# Patient Record
Sex: Female | Born: 1950 | Race: White | Hispanic: No | Marital: Married | Smoking: Never smoker
Health system: Southern US, Community
[De-identification: ages and names within clinical notes are randomized; demographics above are authoritative.]

## PROBLEM LIST (undated history)

## (undated) DIAGNOSIS — IMO0002 Reserved for concepts with insufficient information to code with codable children: Secondary | ICD-10-CM

## (undated) DIAGNOSIS — D649 Anemia, unspecified: Secondary | ICD-10-CM

## (undated) DIAGNOSIS — I739 Peripheral vascular disease, unspecified: Secondary | ICD-10-CM

## (undated) DIAGNOSIS — D689 Coagulation defect, unspecified: Secondary | ICD-10-CM

## (undated) DIAGNOSIS — F419 Anxiety disorder, unspecified: Secondary | ICD-10-CM

## (undated) DIAGNOSIS — M545 Low back pain, unspecified: Secondary | ICD-10-CM

## (undated) DIAGNOSIS — K219 Gastro-esophageal reflux disease without esophagitis: Secondary | ICD-10-CM

## (undated) DIAGNOSIS — K279 Peptic ulcer, site unspecified, unspecified as acute or chronic, without hemorrhage or perforation: Secondary | ICD-10-CM

## (undated) DIAGNOSIS — H269 Unspecified cataract: Secondary | ICD-10-CM

## (undated) DIAGNOSIS — A4902 Methicillin resistant Staphylococcus aureus infection, unspecified site: Secondary | ICD-10-CM

## (undated) DIAGNOSIS — G43709 Chronic migraine without aura, not intractable, without status migrainosus: Secondary | ICD-10-CM

## (undated) DIAGNOSIS — M1990 Unspecified osteoarthritis, unspecified site: Secondary | ICD-10-CM

## (undated) DIAGNOSIS — K589 Irritable bowel syndrome without diarrhea: Secondary | ICD-10-CM

## (undated) DIAGNOSIS — I10 Essential (primary) hypertension: Secondary | ICD-10-CM

## (undated) HISTORY — DX: Coagulation defect, unspecified: D68.9

## (undated) HISTORY — DX: Anemia, unspecified: D64.9

## (undated) HISTORY — DX: Anxiety disorder, unspecified: F41.9

## (undated) HISTORY — DX: Unspecified osteoarthritis, unspecified site: M19.90

## (undated) HISTORY — DX: Low back pain, unspecified: M54.50

## (undated) HISTORY — DX: Low back pain: M54.5

## (undated) HISTORY — DX: Peripheral vascular disease, unspecified: I73.9

## (undated) HISTORY — DX: Peptic ulcer, site unspecified, unspecified as acute or chronic, without hemorrhage or perforation: K27.9

## (undated) HISTORY — DX: Irritable bowel syndrome, unspecified: K58.9

## (undated) HISTORY — DX: Methicillin resistant Staphylococcus aureus infection, unspecified site: A49.02

## (undated) HISTORY — PX: LAPAROSCOPIC BILATERAL SALPINGO OOPHERECTOMY: SHX5890

## (undated) HISTORY — PX: ABDOMINAL HYSTERECTOMY: SHX81

## (undated) HISTORY — DX: Gastro-esophageal reflux disease without esophagitis: K21.9

## (undated) HISTORY — DX: Unspecified cataract: H26.9

## (undated) HISTORY — PX: TONSILLECTOMY AND ADENOIDECTOMY: SUR1326

## (undated) HISTORY — DX: Essential (primary) hypertension: I10

## (undated) HISTORY — DX: Reserved for concepts with insufficient information to code with codable children: IMO0002

## (undated) HISTORY — DX: Chronic migraine without aura, not intractable, without status migrainosus: G43.709

---

## 1982-11-28 HISTORY — PX: TUBAL LIGATION: SHX77

## 1998-05-08 ENCOUNTER — Ambulatory Visit (HOSPITAL_COMMUNITY): Admission: RE | Admit: 1998-05-08 | Discharge: 1998-05-08 | Payer: Self-pay | Admitting: Internal Medicine

## 1998-09-08 ENCOUNTER — Ambulatory Visit (HOSPITAL_COMMUNITY): Admission: RE | Admit: 1998-09-08 | Discharge: 1998-09-08 | Payer: Self-pay | Admitting: Internal Medicine

## 1998-10-16 ENCOUNTER — Encounter: Payer: Self-pay | Admitting: Internal Medicine

## 1999-04-21 ENCOUNTER — Inpatient Hospital Stay (HOSPITAL_COMMUNITY): Admission: RE | Admit: 1999-04-21 | Discharge: 1999-04-23 | Payer: Self-pay | Admitting: Obstetrics and Gynecology

## 1999-10-13 ENCOUNTER — Ambulatory Visit (HOSPITAL_COMMUNITY): Admission: RE | Admit: 1999-10-13 | Discharge: 1999-10-13 | Payer: Self-pay | Admitting: Pulmonary Disease

## 1999-10-13 ENCOUNTER — Encounter: Payer: Self-pay | Admitting: Pulmonary Disease

## 2000-08-30 ENCOUNTER — Ambulatory Visit (HOSPITAL_COMMUNITY): Admission: RE | Admit: 2000-08-30 | Discharge: 2000-08-30 | Payer: Self-pay | Admitting: Internal Medicine

## 2000-11-07 ENCOUNTER — Ambulatory Visit (HOSPITAL_COMMUNITY): Admission: RE | Admit: 2000-11-07 | Discharge: 2000-11-07 | Payer: Self-pay | Admitting: Internal Medicine

## 2000-11-07 ENCOUNTER — Encounter: Payer: Self-pay | Admitting: Internal Medicine

## 2000-12-06 ENCOUNTER — Encounter: Payer: Self-pay | Admitting: General Surgery

## 2000-12-11 ENCOUNTER — Inpatient Hospital Stay (HOSPITAL_COMMUNITY): Admission: RE | Admit: 2000-12-11 | Discharge: 2000-12-16 | Payer: Self-pay | Admitting: General Surgery

## 2000-12-11 ENCOUNTER — Encounter (INDEPENDENT_AMBULATORY_CARE_PROVIDER_SITE_OTHER): Payer: Self-pay

## 2000-12-11 ENCOUNTER — Encounter (INDEPENDENT_AMBULATORY_CARE_PROVIDER_SITE_OTHER): Payer: Self-pay | Admitting: *Deleted

## 2001-02-13 ENCOUNTER — Ambulatory Visit (HOSPITAL_COMMUNITY): Admission: RE | Admit: 2001-02-13 | Discharge: 2001-02-13 | Payer: Self-pay | Admitting: Unknown Physician Specialty

## 2001-02-13 ENCOUNTER — Encounter (INDEPENDENT_AMBULATORY_CARE_PROVIDER_SITE_OTHER): Payer: Self-pay | Admitting: *Deleted

## 2001-02-13 ENCOUNTER — Encounter: Payer: Self-pay | Admitting: General Surgery

## 2001-11-28 HISTORY — PX: OTHER SURGICAL HISTORY: SHX169

## 2002-03-26 ENCOUNTER — Encounter: Payer: Self-pay | Admitting: Pulmonary Disease

## 2002-07-24 ENCOUNTER — Encounter (INDEPENDENT_AMBULATORY_CARE_PROVIDER_SITE_OTHER): Payer: Self-pay | Admitting: *Deleted

## 2002-07-24 ENCOUNTER — Encounter: Payer: Self-pay | Admitting: Pulmonary Disease

## 2002-07-24 ENCOUNTER — Ambulatory Visit (HOSPITAL_COMMUNITY): Admission: RE | Admit: 2002-07-24 | Discharge: 2002-07-24 | Payer: Self-pay | Admitting: Pulmonary Disease

## 2002-07-31 ENCOUNTER — Encounter: Payer: Self-pay | Admitting: Pulmonary Disease

## 2002-07-31 ENCOUNTER — Ambulatory Visit (HOSPITAL_COMMUNITY): Admission: RE | Admit: 2002-07-31 | Discharge: 2002-07-31 | Payer: Self-pay | Admitting: Pulmonary Disease

## 2003-01-03 ENCOUNTER — Encounter: Payer: Self-pay | Admitting: Internal Medicine

## 2003-01-15 ENCOUNTER — Encounter: Payer: Self-pay | Admitting: Internal Medicine

## 2003-07-16 ENCOUNTER — Ambulatory Visit (HOSPITAL_COMMUNITY): Admission: RE | Admit: 2003-07-16 | Discharge: 2003-07-16 | Payer: Self-pay | Admitting: Internal Medicine

## 2003-07-16 ENCOUNTER — Encounter (INDEPENDENT_AMBULATORY_CARE_PROVIDER_SITE_OTHER): Payer: Self-pay | Admitting: *Deleted

## 2003-07-16 ENCOUNTER — Encounter: Payer: Self-pay | Admitting: Internal Medicine

## 2004-11-23 ENCOUNTER — Ambulatory Visit: Payer: Self-pay | Admitting: Pulmonary Disease

## 2004-12-03 ENCOUNTER — Ambulatory Visit (HOSPITAL_COMMUNITY): Admission: RE | Admit: 2004-12-03 | Discharge: 2004-12-03 | Payer: Self-pay | Admitting: Pulmonary Disease

## 2004-12-03 IMAGING — CT CT CHEST W/ CM
1 of 2 series · 15 of 32 positions shown, 19 images · IV contrast (omnipaque)
Comparison: none

CLINICAL DATA: 53 year old with right lower lobe nodule.  
 CT CHEST:
 Prior study from [HOSPITAL] performed on [DATE] is used for comparison. 
 Helical images are performed through the chest during the administration of 100 cc of Omnipaque 300.  Within the right lower lobe, there is a 4 mm nodule (image 33-50). This appears stable.  Small subpleural density is seen adjacent to the right middle lobe on image on image 36-50 is also stable in appearance.  There may be a small nodule within the right middle lobe measuring 5 mm on image 33-50 vs. small vessel but this is also stable in appearance.  No new nodules are identified.  No focal consolidations or pleural effusions are seen. There is minimal bibasilar dependent change.  
 No mediastinal, hilar axillary adenopathy.  Images of the upper abdomen show surgical clips in the GE junction region but no other acute abnormality identified.

[Series 2: chest routine 5.0 b30f · axial · 0.60mm/px · z∈[+1066,+1332]mm · 15 of 63 slices shown, 19 images]
[im 5/63  mediastinal]
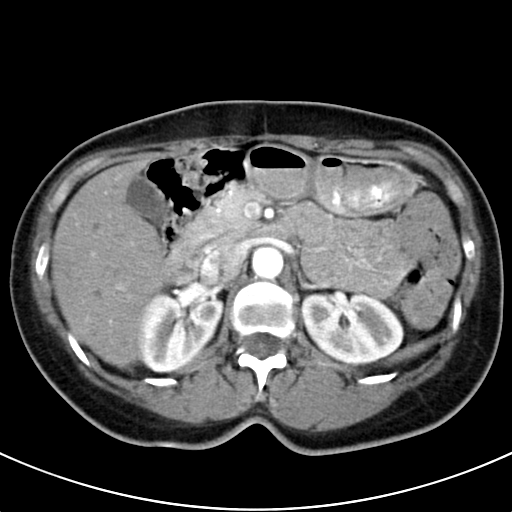
[im 5/63  lung]
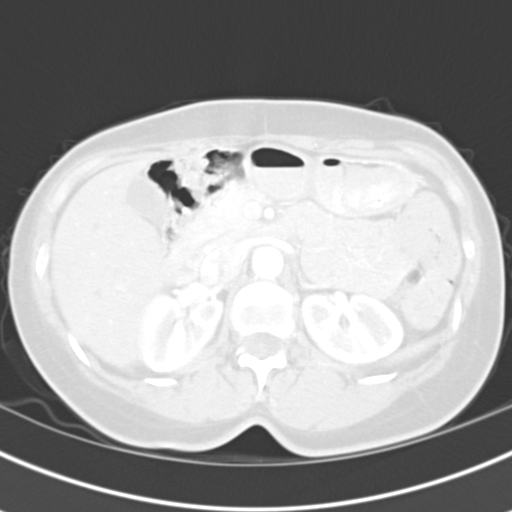
[im 9/63  lung]
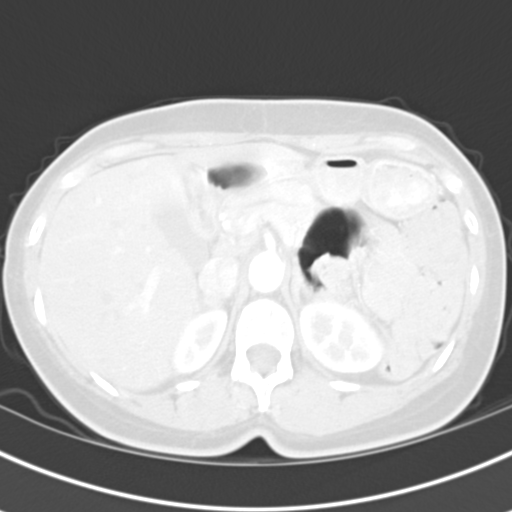
[im 13/63  lung]
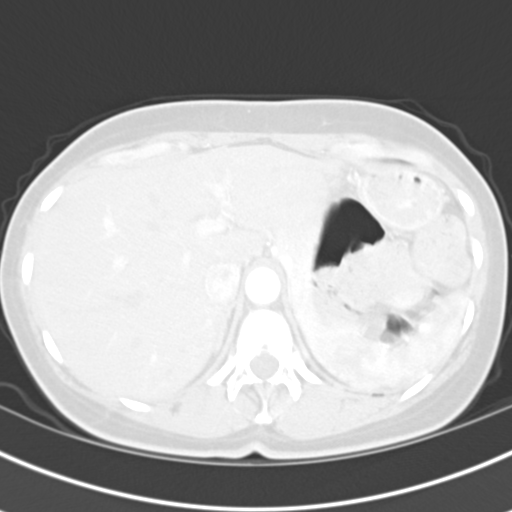
[im 17/63  lung]
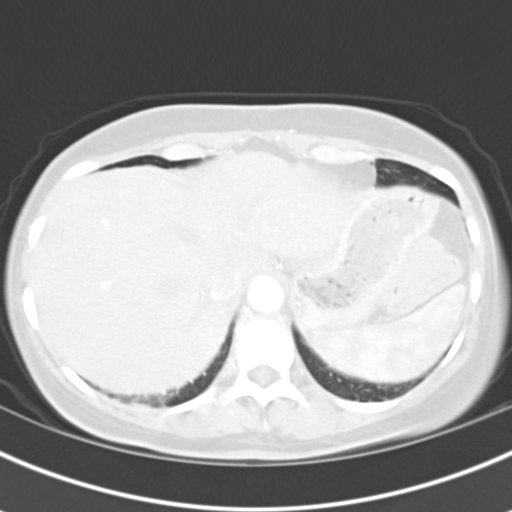
[im 21/63  mediastinal]
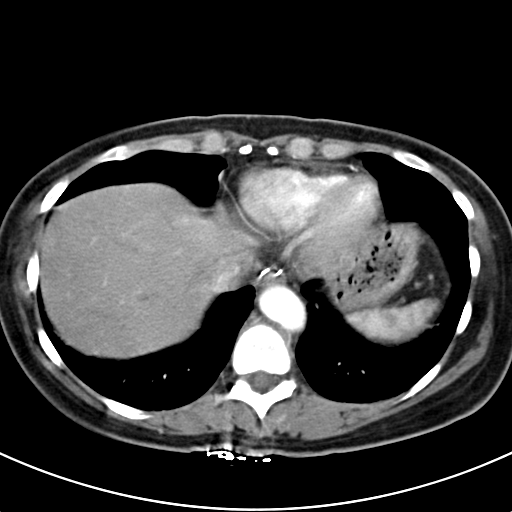
[im 21/63  lung]
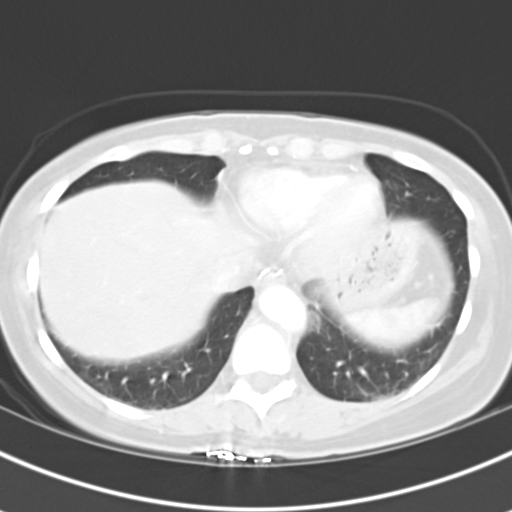
[im 25/63  lung]
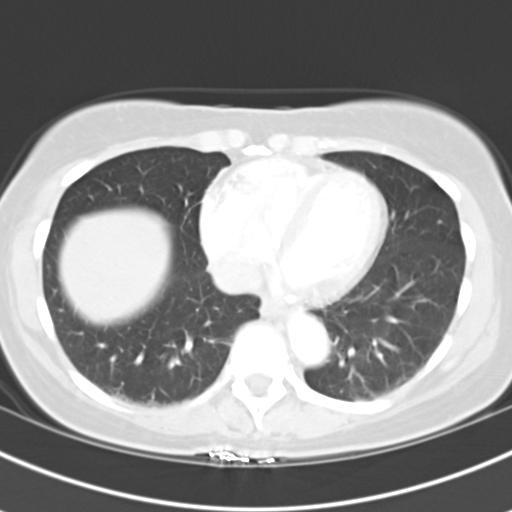
[im 29/63  lung]
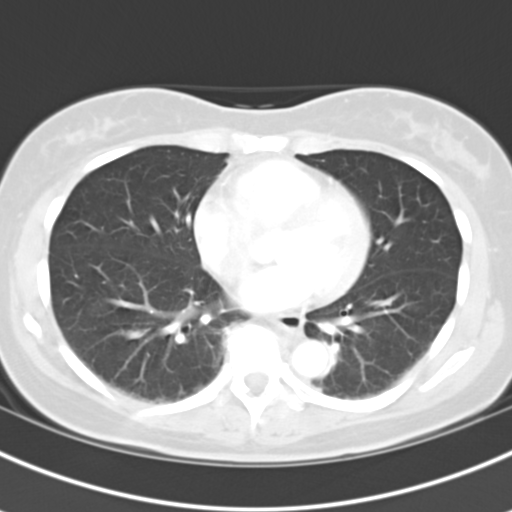
[im 32/63  lung]
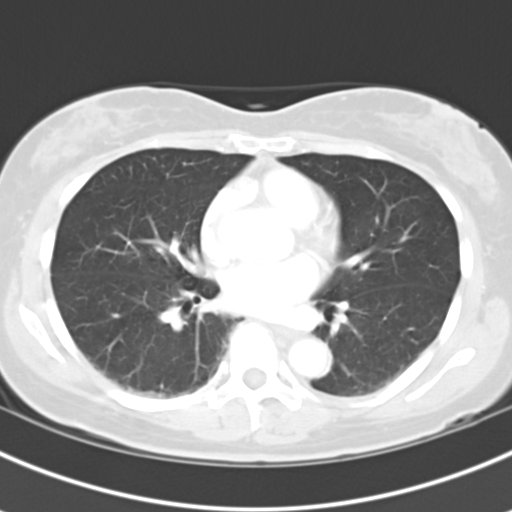
[im 34/63  mediastinal]
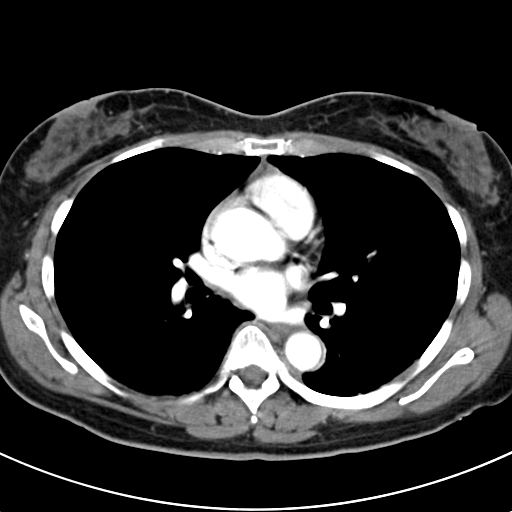
[im 34/63  lung]
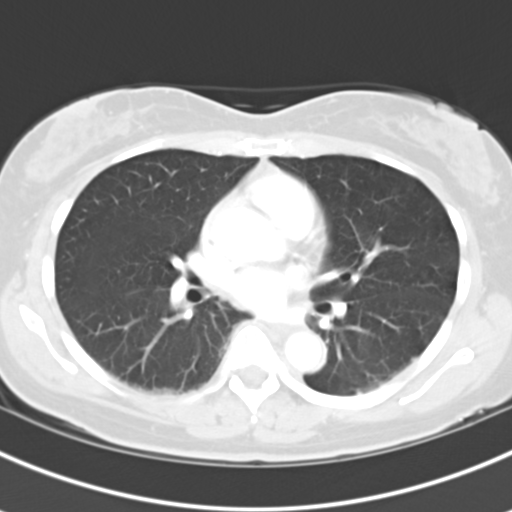
[im 38/63  lung]
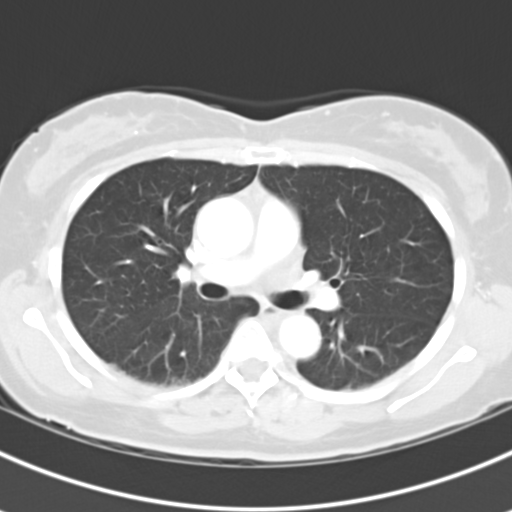
[im 42/63  lung]
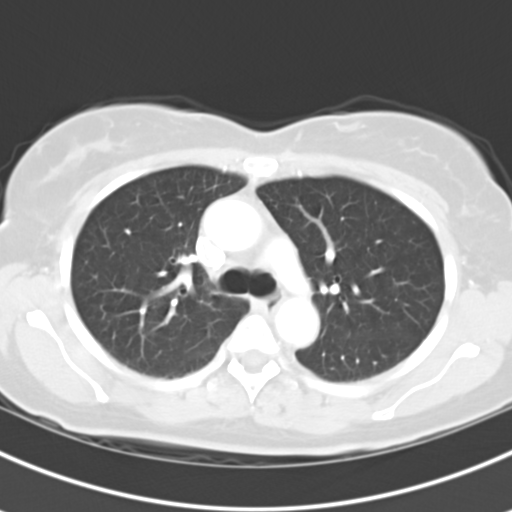
[im 46/63  lung]
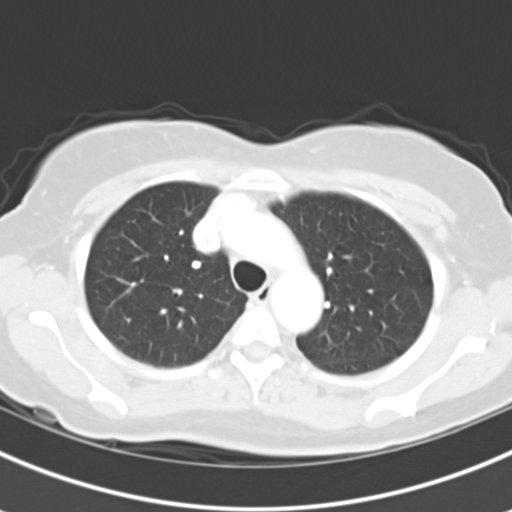
[im 50/63  mediastinal]
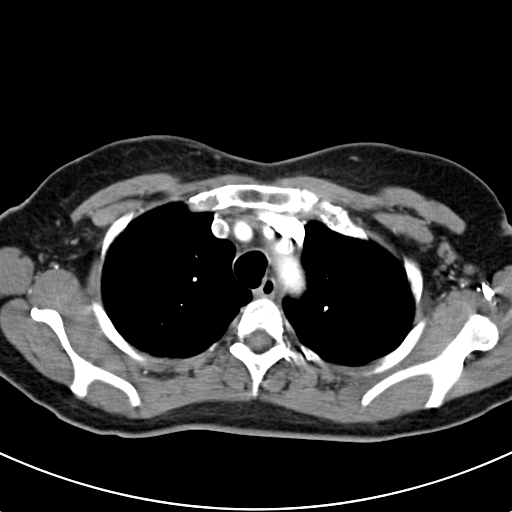
[im 50/63  lung]
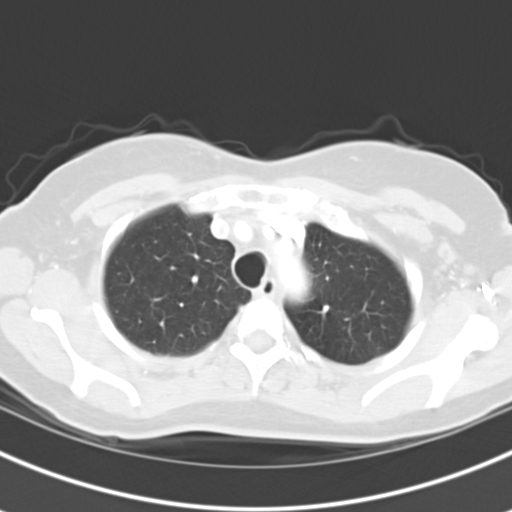
[im 54/63  lung]
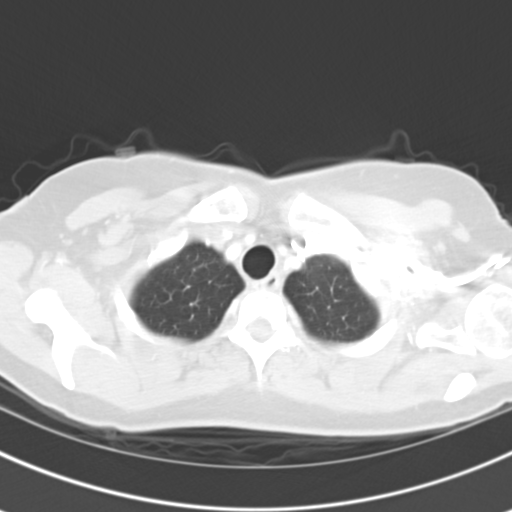
[im 58/63  lung]
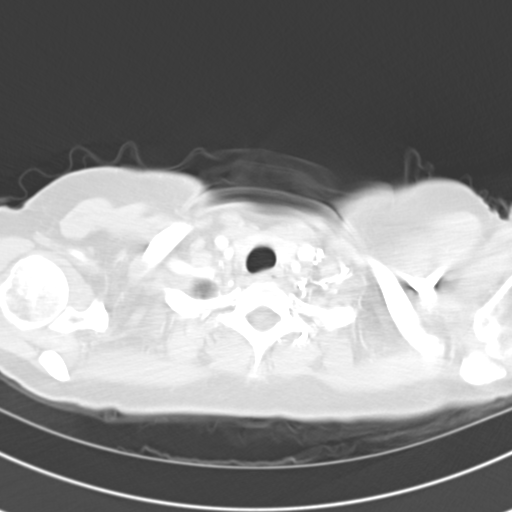

[15 of 32 positions shown; findings below may reference images not displayed]

IMPRESSION: Stable right lung nodules. These are nonspecific in appearance and may be merely post inflammatory given their stability.  I would recommend a second 6 month follow-up however for further evaluation.

## 2006-06-02 ENCOUNTER — Ambulatory Visit: Payer: Self-pay | Admitting: Pulmonary Disease

## 2007-01-25 ENCOUNTER — Ambulatory Visit: Payer: Self-pay | Admitting: Pulmonary Disease

## 2007-11-07 ENCOUNTER — Encounter: Payer: Self-pay | Admitting: Pulmonary Disease

## 2007-11-19 ENCOUNTER — Encounter: Payer: Self-pay | Admitting: Pulmonary Disease

## 2007-11-19 ENCOUNTER — Ambulatory Visit: Payer: Self-pay | Admitting: Vascular Surgery

## 2008-02-26 ENCOUNTER — Encounter: Payer: Self-pay | Admitting: Pulmonary Disease

## 2008-04-01 ENCOUNTER — Ambulatory Visit: Payer: Self-pay | Admitting: Pulmonary Disease

## 2008-04-01 ENCOUNTER — Encounter: Payer: Self-pay | Admitting: Adult Health

## 2008-04-01 DIAGNOSIS — G43909 Migraine, unspecified, not intractable, without status migrainosus: Secondary | ICD-10-CM | POA: Insufficient documentation

## 2008-04-01 DIAGNOSIS — J309 Allergic rhinitis, unspecified: Secondary | ICD-10-CM | POA: Insufficient documentation

## 2008-04-01 DIAGNOSIS — M545 Low back pain, unspecified: Secondary | ICD-10-CM | POA: Insufficient documentation

## 2008-04-01 DIAGNOSIS — I10 Essential (primary) hypertension: Secondary | ICD-10-CM | POA: Insufficient documentation

## 2008-04-01 DIAGNOSIS — F419 Anxiety disorder, unspecified: Secondary | ICD-10-CM | POA: Insufficient documentation

## 2008-04-01 DIAGNOSIS — K219 Gastro-esophageal reflux disease without esophagitis: Secondary | ICD-10-CM | POA: Insufficient documentation

## 2008-04-01 DIAGNOSIS — G43709 Chronic migraine without aura, not intractable, without status migrainosus: Secondary | ICD-10-CM

## 2008-04-02 LAB — CONVERTED CEMR LAB
ALT: 14 units/L (ref 0–35)
AST: 20 units/L (ref 0–37)
Albumin: 4 g/dL (ref 3.5–5.2)
Alkaline Phosphatase: 65 units/L (ref 39–117)
BUN: 22 mg/dL (ref 6–23)
Basophils Relative: 4.5 % — ABNORMAL HIGH (ref 0.0–1.0)
CO2: 29 meq/L (ref 19–32)
Chloride: 112 meq/L (ref 96–112)
Eosinophils Absolute: 0.2 10*3/uL (ref 0.0–0.7)
Eosinophils Relative: 5.3 % — ABNORMAL HIGH (ref 0.0–5.0)
GFR calc non Af Amer: 54 mL/min
Lymphocytes Relative: 48.4 % — ABNORMAL HIGH (ref 12.0–46.0)
MCV: 90.4 fL (ref 78.0–100.0)
Monocytes Relative: 3.4 % (ref 3.0–12.0)
Neutrophils Relative %: 38.4 % — ABNORMAL LOW (ref 43.0–77.0)
Platelets: 207 10*3/uL (ref 150–400)
Potassium: 3.6 meq/L (ref 3.5–5.1)
RBC: 3.92 M/uL (ref 3.87–5.11)
Total Bilirubin: 0.6 mg/dL (ref 0.3–1.2)
WBC: 4.6 10*3/uL (ref 4.5–10.5)

## 2008-05-29 ENCOUNTER — Telehealth: Payer: Self-pay | Admitting: Pulmonary Disease

## 2008-06-03 ENCOUNTER — Ambulatory Visit: Payer: Self-pay | Admitting: Pulmonary Disease

## 2008-06-09 ENCOUNTER — Ambulatory Visit: Payer: Self-pay | Admitting: Pulmonary Disease

## 2008-06-09 DIAGNOSIS — I739 Peripheral vascular disease, unspecified: Secondary | ICD-10-CM | POA: Insufficient documentation

## 2008-06-09 DIAGNOSIS — J984 Other disorders of lung: Secondary | ICD-10-CM

## 2008-06-09 DIAGNOSIS — R911 Solitary pulmonary nodule: Secondary | ICD-10-CM | POA: Insufficient documentation

## 2008-06-09 LAB — CONVERTED CEMR LAB
ALT: 19 units/L (ref 0–35)
AST: 21 units/L (ref 0–37)
Alkaline Phosphatase: 71 units/L (ref 39–117)
Bilirubin, Direct: 0.1 mg/dL (ref 0.0–0.3)
CO2: 26 meq/L (ref 19–32)
Chloride: 108 meq/L (ref 96–112)
Glucose, Bld: 90 mg/dL (ref 70–99)
HDL: 40.7 mg/dL (ref >39.0)
LDL Cholesterol: 80 mg/dL (ref 0–99)
Lymphocytes Relative: 37.5 % (ref 12.0–46.0)
Monocytes Relative: 9.2 % (ref 3.0–12.0)
Platelets: 245 10*3/uL (ref 150–400)
Potassium: 3.4 meq/L — ABNORMAL LOW (ref 3.5–5.1)
RDW: 13.4 % (ref 11.5–14.6)
Sodium: 141 meq/L (ref 135–145)
Total CHOL/HDL Ratio: 3.6
Total Protein: 7 g/dL (ref 6.0–8.3)
Triglycerides: 126 mg/dL (ref 0–149)
WBC: 4.8 10*3/uL (ref 4.5–10.5)

## 2008-08-15 ENCOUNTER — Telehealth (INDEPENDENT_AMBULATORY_CARE_PROVIDER_SITE_OTHER): Payer: Self-pay | Admitting: *Deleted

## 2008-08-28 ENCOUNTER — Telehealth (INDEPENDENT_AMBULATORY_CARE_PROVIDER_SITE_OTHER): Payer: Self-pay | Admitting: *Deleted

## 2008-09-28 ENCOUNTER — Telehealth: Payer: Self-pay | Admitting: Pulmonary Disease

## 2008-12-08 ENCOUNTER — Ambulatory Visit: Payer: Self-pay | Admitting: Pulmonary Disease

## 2008-12-10 ENCOUNTER — Ambulatory Visit: Payer: Self-pay | Admitting: Pulmonary Disease

## 2008-12-12 LAB — CONVERTED CEMR LAB
ALT: 19 units/L (ref 0–35)
Alkaline Phosphatase: 61 units/L (ref 39–117)
Bilirubin, Direct: 0.1 mg/dL (ref 0.0–0.3)
CO2: 28 meq/L (ref 19–32)
Calcium: 9.2 mg/dL (ref 8.4–10.5)
Glucose, Bld: 75 mg/dL (ref 70–99)
Hemoglobin: 13 g/dL (ref 12.0–15.0)
LDL Cholesterol: 105 mg/dL — ABNORMAL HIGH (ref 0–99)
Lymphocytes Relative: 47.4 % — ABNORMAL HIGH (ref 12.0–46.0)
Monocytes Relative: 9.9 % (ref 3.0–12.0)
Neutro Abs: 1.4 10*3/uL (ref 1.4–7.7)
RBC: 4.24 M/uL (ref 3.87–5.11)
RDW: 14 % (ref 11.5–14.6)
Sodium: 144 meq/L (ref 135–145)
Total CHOL/HDL Ratio: 3.5
Total Protein: 7.5 g/dL (ref 6.0–8.3)
Triglycerides: 121 mg/dL (ref 0–149)
WBC: 3.9 10*3/uL — ABNORMAL LOW (ref 4.5–10.5)

## 2009-01-07 DIAGNOSIS — R1013 Epigastric pain: Secondary | ICD-10-CM | POA: Insufficient documentation

## 2009-01-07 DIAGNOSIS — K315 Obstruction of duodenum: Secondary | ICD-10-CM | POA: Insufficient documentation

## 2009-01-07 DIAGNOSIS — M1990 Unspecified osteoarthritis, unspecified site: Secondary | ICD-10-CM | POA: Insufficient documentation

## 2009-01-07 DIAGNOSIS — K277 Chronic peptic ulcer, site unspecified, without hemorrhage or perforation: Secondary | ICD-10-CM | POA: Insufficient documentation

## 2009-01-07 DIAGNOSIS — K589 Irritable bowel syndrome without diarrhea: Secondary | ICD-10-CM | POA: Insufficient documentation

## 2009-01-07 DIAGNOSIS — Z862 Personal history of diseases of the blood and blood-forming organs and certain disorders involving the immune mechanism: Secondary | ICD-10-CM | POA: Insufficient documentation

## 2009-01-07 DIAGNOSIS — Z8719 Personal history of other diseases of the digestive system: Secondary | ICD-10-CM | POA: Insufficient documentation

## 2009-01-07 DIAGNOSIS — F32A Depression, unspecified: Secondary | ICD-10-CM | POA: Insufficient documentation

## 2009-01-07 DIAGNOSIS — F329 Major depressive disorder, single episode, unspecified: Secondary | ICD-10-CM

## 2009-01-07 DIAGNOSIS — K449 Diaphragmatic hernia without obstruction or gangrene: Secondary | ICD-10-CM | POA: Insufficient documentation

## 2009-03-03 ENCOUNTER — Encounter: Payer: Self-pay | Admitting: Pulmonary Disease

## 2009-06-08 ENCOUNTER — Ambulatory Visit: Payer: Self-pay | Admitting: Pulmonary Disease

## 2009-06-08 DIAGNOSIS — E559 Vitamin D deficiency, unspecified: Secondary | ICD-10-CM | POA: Insufficient documentation

## 2009-06-08 IMAGING — CR DG CHEST 2V
2 series · 2 of 2 positions shown · non-contrast
Comparison: [DATE]

CLINICAL DATA: Pulmonary nodule.

CHEST - 2 VIEW

[view not recorded (1 of 2)]
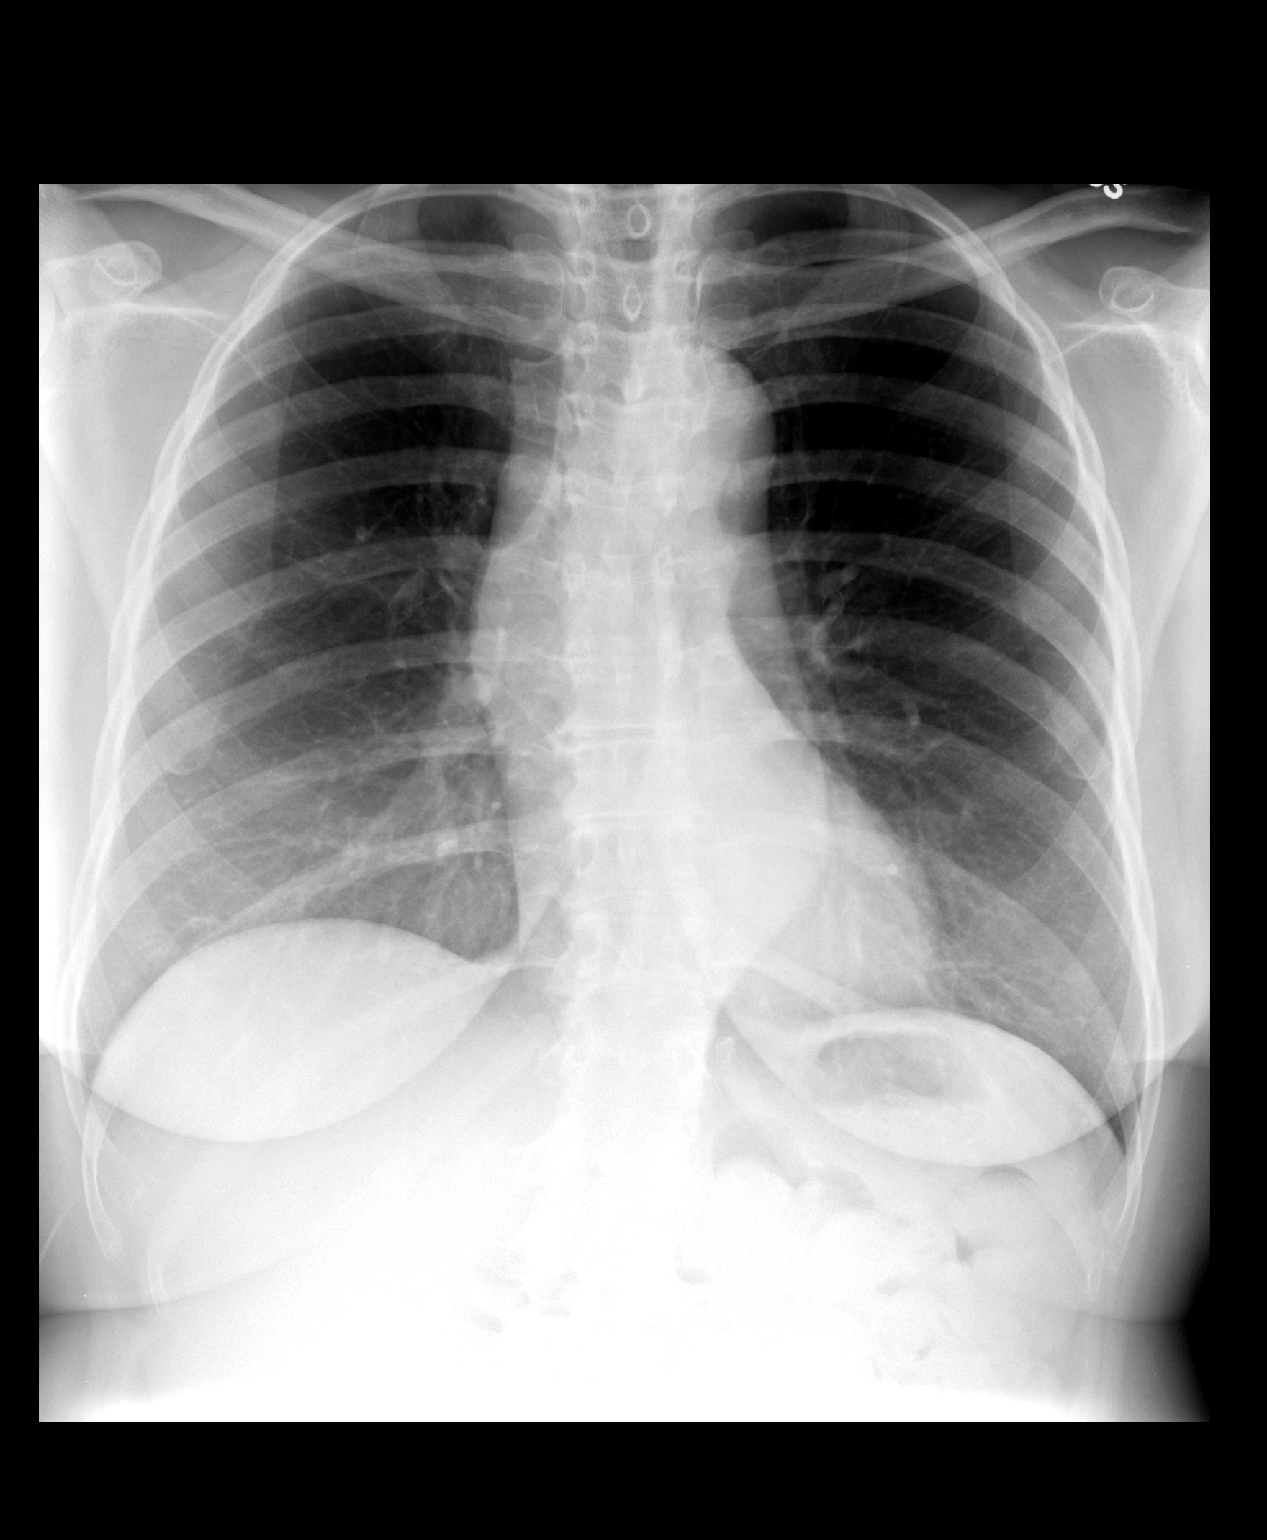

[view not recorded (2 of 2)]
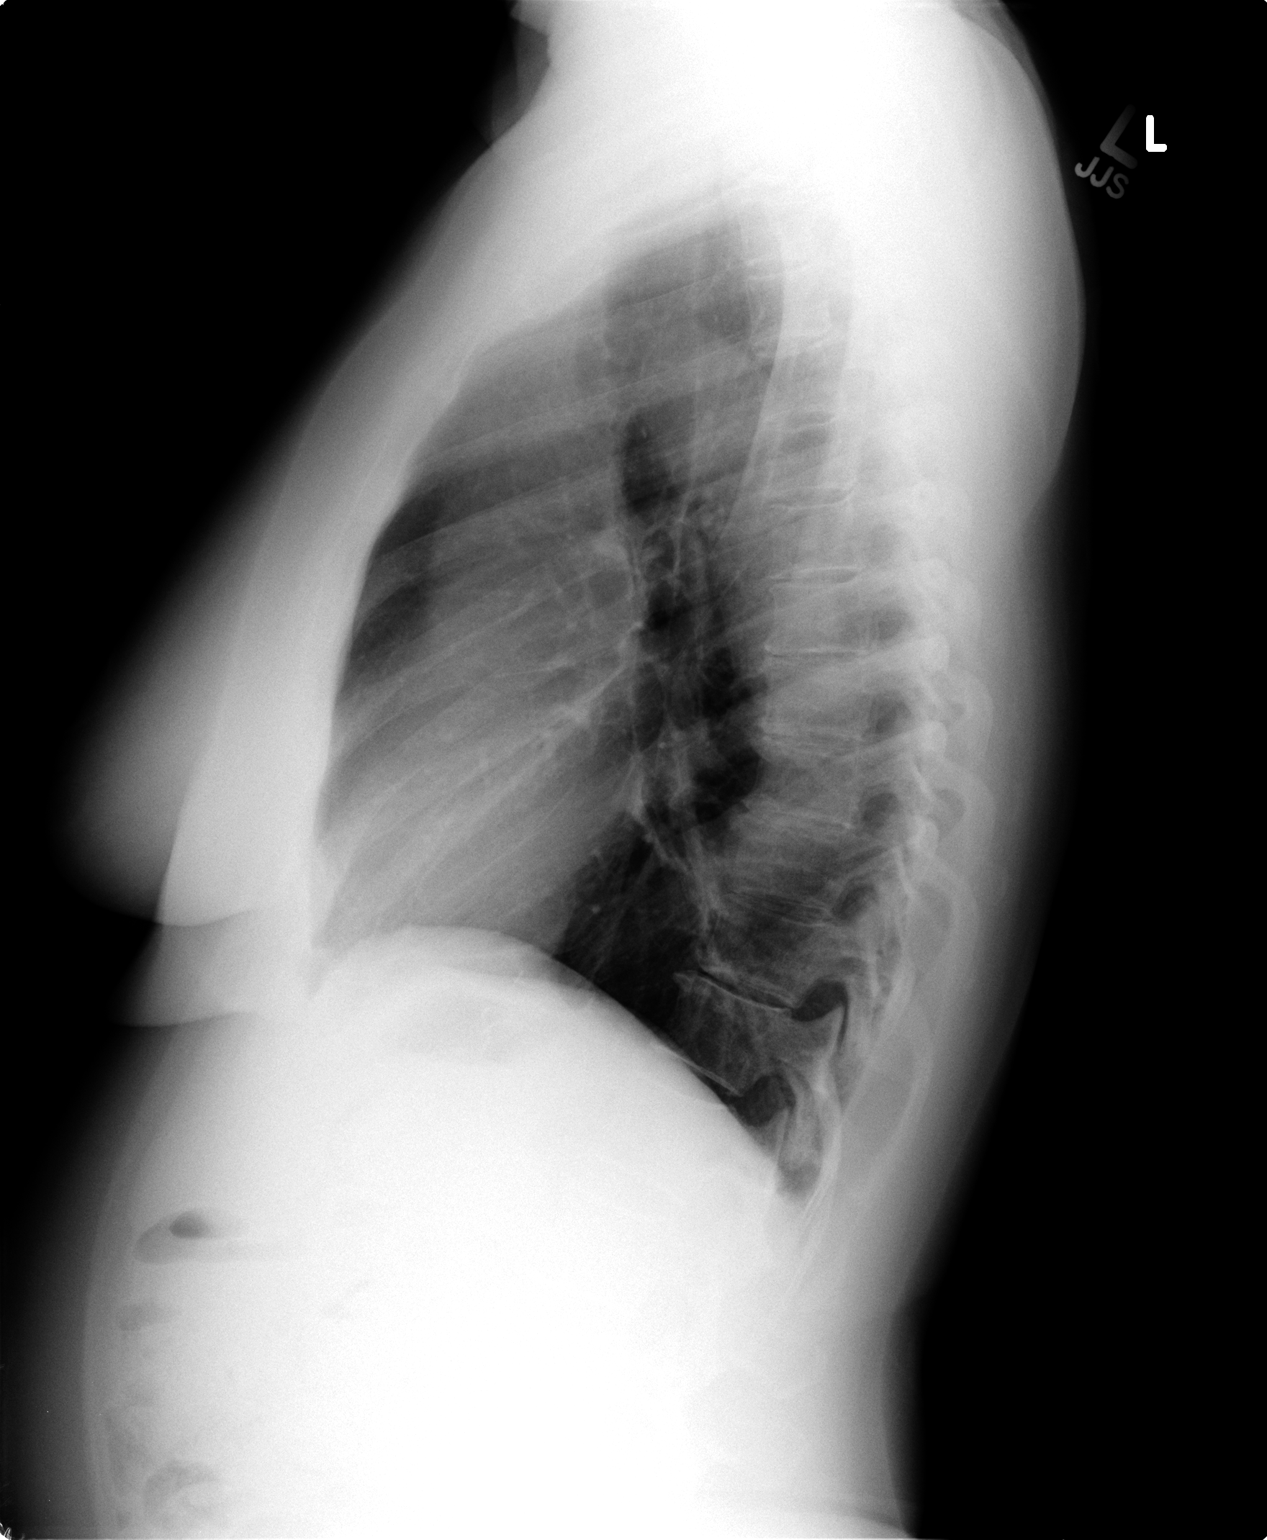

[2 of 2 positions shown; findings below may reference images not displayed]

FINDINGS: Trachea is midline.  Heart size normal.  Thoracic aorta
is tortuous, and there is prominence of the ascending portion.
Lungs are clear.  No pleural fluid.
IMPRESSION: No acute findings.

## 2009-07-21 ENCOUNTER — Telehealth (INDEPENDENT_AMBULATORY_CARE_PROVIDER_SITE_OTHER): Payer: Self-pay | Admitting: *Deleted

## 2009-08-26 ENCOUNTER — Encounter: Payer: Self-pay | Admitting: Pulmonary Disease

## 2009-08-31 ENCOUNTER — Encounter: Payer: Self-pay | Admitting: Pulmonary Disease

## 2009-10-01 ENCOUNTER — Encounter: Payer: Self-pay | Admitting: Pulmonary Disease

## 2009-10-12 ENCOUNTER — Encounter: Payer: Self-pay | Admitting: Pulmonary Disease

## 2009-10-16 ENCOUNTER — Ambulatory Visit: Payer: Self-pay | Admitting: Pulmonary Disease

## 2009-12-15 ENCOUNTER — Telehealth: Payer: Self-pay | Admitting: Pulmonary Disease

## 2009-12-21 ENCOUNTER — Telehealth (INDEPENDENT_AMBULATORY_CARE_PROVIDER_SITE_OTHER): Payer: Self-pay | Admitting: *Deleted

## 2010-01-01 ENCOUNTER — Encounter (INDEPENDENT_AMBULATORY_CARE_PROVIDER_SITE_OTHER): Payer: Self-pay | Admitting: *Deleted

## 2010-03-10 ENCOUNTER — Ambulatory Visit: Payer: Self-pay | Admitting: Pulmonary Disease

## 2010-03-14 LAB — CONVERTED CEMR LAB
BUN: 21 mg/dL (ref 6–23)
Basophils Absolute: 0 10*3/uL (ref 0.0–0.1)
Bilirubin Urine: NEGATIVE
CO2: 28 meq/L (ref 19–32)
Calcium: 8.8 mg/dL (ref 8.4–10.5)
Creatinine, Ser: 1.2 mg/dL (ref 0.4–1.2)
Eosinophils Absolute: 0.2 10*3/uL (ref 0.0–0.7)
Glucose, Bld: 89 mg/dL (ref 70–99)
Hemoglobin, Urine: NEGATIVE
Lymphocytes Relative: 38.5 % (ref 12.0–46.0)
MCHC: 34.3 g/dL (ref 30.0–36.0)
Neutrophils Relative %: 44.5 % (ref 43.0–77.0)
Nitrite: POSITIVE
RDW: 14.9 % — ABNORMAL HIGH (ref 11.5–14.6)
Urine Glucose: NEGATIVE mg/dL
Urobilinogen, UA: 0.2 (ref 0.0–1.0)

## 2010-03-18 ENCOUNTER — Telehealth (INDEPENDENT_AMBULATORY_CARE_PROVIDER_SITE_OTHER): Payer: Self-pay | Admitting: *Deleted

## 2010-08-18 ENCOUNTER — Encounter: Payer: Self-pay | Admitting: Pulmonary Disease

## 2010-08-19 ENCOUNTER — Encounter: Payer: Self-pay | Admitting: Pulmonary Disease

## 2010-08-24 ENCOUNTER — Encounter: Payer: Self-pay | Admitting: Pulmonary Disease

## 2010-10-08 ENCOUNTER — Encounter: Payer: Self-pay | Admitting: Pulmonary Disease

## 2010-10-19 ENCOUNTER — Ambulatory Visit: Payer: Self-pay | Admitting: Pulmonary Disease

## 2010-10-20 ENCOUNTER — Telehealth (INDEPENDENT_AMBULATORY_CARE_PROVIDER_SITE_OTHER): Payer: Self-pay | Admitting: *Deleted

## 2010-10-26 ENCOUNTER — Telehealth: Payer: Self-pay | Admitting: Pulmonary Disease

## 2010-10-26 ENCOUNTER — Encounter: Payer: Self-pay | Admitting: Pulmonary Disease

## 2010-11-03 ENCOUNTER — Telehealth: Payer: Self-pay | Admitting: Pulmonary Disease

## 2010-11-04 ENCOUNTER — Telehealth: Payer: Self-pay | Admitting: Pulmonary Disease

## 2010-12-28 NOTE — Progress Notes (Signed)
Summary: letter to employer  Phone Note Call from Patient Call back at [phone]   Caller: Patient Call For: Conley Pawling Summary of Call: Pt states she goes to lunch at 7a and takes lunch at 9:45a, needs a letter for her employer stating that she needs a later lunch between 10:30 and 11a, re: medication issues. Initial call taken by: Darletta Moll,  October 26, 2010 3:52 PM  Follow-up for Phone Call        Spoke with pt and she states that she goes into work at [employer] nd they have her set to go to lunch at 9:45am. Pt requested that this be changed to 10:45 or 11am and she was told she would need a doctors note to have her lunch changed to that time. I asked the pt if there was any medical reason to have her lunch changed to 11am and she staets no, "she just doen't want to eat that early." Please advise. Carron Curie CMA  October 26, 2010 4:19 PM   Additional Follow-up for Phone Call Additional follow up Details #1::        ok for letter per SN----letter is done and left up front for pt to pick up---pt stated that she will be by tomorrow afternoon Randell Loop [facility]  October 26, 2010 4:33 PM

## 2010-12-28 NOTE — Letter (Signed)
Summary: [facility] [employer] [facility]  [address]   [place], [place] [address]   Phone: [phone]  Fax: [phone]    10/26/2010     To Whom It May Concern,    Ms. Karole Oo is a patient of mine followed for general medical purposes.     This letter is on behalf of the patient that her lunch be moved from 9:45 am to 11 am due to medical reasons.   If you have further questions, please feel free to contact my office.           Sincerely,       Lonzo Cloud. Elayne Snare. D.

## 2010-12-28 NOTE — Medication Information (Signed)
Summary: Klonopin/[employer]  Klonopin/[employer]   Imported By: Sherian Rein 09/09/2010 14:45:29  _____________________________________________________________________  External Attachment:    Type:   Image     Comment:   External Document

## 2010-12-28 NOTE — Assessment & Plan Note (Signed)
Summary: 6 month month/mhh   CC:  9 month ROV & review of mult medical problems....  History of Present Illness: 60 y/o BF here for a follow up visit... she has multiple medical problems as noted below...     ~  June 08, 2009:  here for a 61mo ROV & refill of meds per request... she notes some intermittent difficulty w/ sinusitis & has seen ENT in the past... similarly c/o intermittent GI prob w/ swallowing, & IBS symptoms- prev eval DrBrodie w/ chr PUD & GOO but had surg 2003 by DrToth w/ duod stricture-plasty and vagotomy--- offered f/u w/ DrDBrodie & she will decide... she notes [facility] eval in 2009 w/ scans and ?cardiac eval at [facility] (we don't have these notes and will call for records to review)...   ~  March 10, 2010:  she has mult somatic complaints- fatigue, ?uti, sinus drainage, dizziness she thinks is secondary to the Nasonex, etc... requesting refills & new epipen... we discussed f/u labs (all OK), and UA (prob UTI Rx'd).    Current Problem List:  ALLERGIC RHINITIS (ICD-477.9) - uses OTC antihistamines as needed.... seen by DrLawrence & DrSJacobs for ENT in the past... also seen by DrESL in 2005 w/ pos reactions to shellfish, dust, trees, grass, weeds... she wants epipen to keep on hand.  Hx of PULMONARY NODULE (ICD-518.89) - prev CTChest in 2005, 2006, & 2007 w/ several small 4-52mm right middle and lower lobe nodules without change, felt to be benign...  ~  CXR 7/10 showed Clear & NAD (mild tort Ao)...  HYPERTENSION (ICD-401.9) - hx ACE cough in past... controlled on LASIX 20mg /d... BP= 128/82 today, takes meds regularly & tolerates well... denies fatigue, visual changes, CP, palipit, dizziness, syncope, dyspnea, edema, etc...  ~  2[facility]cho 12/08 at [facility] showed norm LVF, mild MR, no source for emboli etc...  PERIPHERAL VASCULAR DISEASE (ICD-443.9) - CTChest in 2007 also showed ectasia of asc thor Ao... rec to take ASA 81mg /d...  ~  CDopplers 12/08 at [facility] showed mild soft paque in  CCAs bilat and 0-39% bilat ICA stenoses...  GERD (ICD-530.81) - on NEXIUM 40mg - 1daily... hx chronic PUD w/ gastric outlet obstruction in past... last EGD 10/01 by DrDBrodie showed duod stricture and balloon dilatation performed... subseq surgery in 2003 by DrToth w/ duod stricture-plasty & vagotomy...  IRRITABLE BOWEL SYNDROME (ICD-564.1) - on LIBRAX Qid Prn...  ~  last colon in EMR= 11/99 & neg/ WNL...  LOW BACK PAIN SYNDROME (ICD-724.2)  HEADACHE (ICD-784.0) - followed for yrs in the [facility] now)... currently taking: ZOLOFT 100mg - 2tabsQam,  TOPAMAX 200mg - 2tabdQhs,  IMITREX 100mg Prn,  FLEXERIL 10mg - 1-2 QhsPrn, & AMBIEN 10mg  Prn... she tells me that [facility] requires that Rx for narcotics be filled by the pt's primary physician- she is requesting VICODIN ES 7.5mg ...  ~  [facility] note 4/10 reviewed...  ANXIETY (ICD-300.00) - on KLONOPIN 0.5mg - 1/2-1 tabTidPrn... she wants Rx for #270 for mail order... I explained to her that she needs ROV Q99mo for monitoring & to get these perscriptions refilled...  VITAMIN D DEFICIENCY (ICD-268.9) - now on Vit D OTC 1000 u daily...  ~  labs 5/09 showed Vit D level = 7... started Vit D 50000 u weekly.  ~  labs 1/10 showed Vit D level = 67... OK to switch to 1000 u OTC daily...   Allergies: 1)  ! Lisinopril (Lisinopril) 2)  ! Amoxicillin 3)  ! Keflex  Comments:  Nurse/Medical Assistant: The  patient's medications and allergies were reviewed with the patient and were updated in the Medication and Allergy Lists.  Past History:  Past Medical History: ALLERGIC RHINITIS (ICD-477.9) Hx of PULMONARY NODULE (ICD-518.89) HYPERTENSION (ICD-401.9) PERIPHERAL VASCULAR DISEASE (ICD-443.9) GERD (ICD-530.81) IRRITABLE BOWEL SYNDROME (ICD-564.1) LOW BACK PAIN SYNDROME (ICD-724.2) HEADACHE (ICD-784.0) ANXIETY (ICD-300.00) VITAMIN D DEFICIENCY (ICD-268.9)  Past Surgical History: S/P T & A as a child S/P bilateral tubal ligation  1984 S/P hysterectomy S/P PUD surg w/ duod stricture-plasty & vagotomy 2003 by DrToth  Family History: Reviewed history from 10/16/2009 and no changes required. mother deceased age 29 from natural causes father deceased unsure of age from aneurysm 1 brother alive age 2  hx of prostate cancer that is in remission caused from agent orange, hearing loss secondary to [place] War injury.  Social History: Reviewed history from 06/08/2009 and no changes required. Patient has never smoked.  exposed to second hand smoke---her husband smokes exercises 3 times per week caffeine use daily alcohol use rare married 1 son  Review of Systems      See HPI       The patient complains of dyspnea on exertion and muscle weakness.  The patient denies anorexia, fever, weight loss, weight gain, vision loss, decreased hearing, hoarseness, chest pain, syncope, peripheral edema, prolonged cough, headaches, hemoptysis, abdominal pain, melena, hematochezia, severe indigestion/heartburn, hematuria, incontinence, suspicious skin lesions, transient blindness, difficulty walking, depression, unusual weight change, abnormal bleeding, enlarged lymph nodes, and angioedema.    Vital Signs:  Patient profile:   60 year old female Height:      64 inches Weight:      135.38 pounds BMI:     23.32 O2 Sat:      95 % on Room air Temp:     97.1 degrees F oral Pulse rate:   90 / minute BP sitting:   128 / 82  (left arm) Cuff size:   regular  Vitals Entered By: Randell Loop CMA (March 10, 2010 10:57 AM)  O2 Sat at Rest %:  95 O2 Flow:  Room air CC: 9 month ROV & review of mult medical problems... Is Patient Diabetic? No Pain Assessment Patient in pain? no      Comments meds updated today   Physical Exam  Additional Exam:  WD, WN, 60 y/o BF in NAD... weight = 136# stable... GENERAL:  Alert & oriented; pleasant & cooperative... HEENT:  [place]/AT, EOM-wnl, PERRLA, EACs-clear, TMs-wnl, NOSE-clear, THROAT-clear &  wnl, sl sinus tender on palp. NECK:  Supple w/ fairROM; no JVD; normal carotid impulses w/o bruits; no thyromegaly or nodules palpated; no lymphadenopathy. CHEST:  Clear to P & A; without wheezes/ rales/ or rhonchi heard... HEART:  Regular Rhythm; without murmurs/ rubs/ or gallops. ABDOMEN:  Soft & nontender; normal bowel sounds; no organomegaly or masses detected. EXT: without deformities or arthritic changes; no varicose veins/ venous insuffic/ or edema. NEURO:  CN's intact;  no focal neuro deficits... DERM:  No lesions noted; no rash etc...     MISC. Report  Procedure date:  03/10/2010  Findings:      BMP (METABOL)   Sodium                    143 mEq/L                   135-145   Potassium                 3.7 mEq/L  3.5-5.1   Chloride                  108 mEq/L                   96-112   Carbon Dioxide            28 mEq/L                    19-32   Glucose                   89 mg/dL                    16-10   BUN                       21 mg/dL                    9-60   Creatinine                1.2 mg/dL                   4.5-4.0   Calcium                   8.8 mg/dL                   9.8-11.9   GFR                       48.80 mL/min                >60  CBC Platelet w/Diff (CBCD)   White Cell Count     [L]  3.2 K/uL                    4.5-10.5   Red Cell Count            3.91 Mil/uL                 3.87-5.11   Hemoglobin                12.0 g/dL                   14.7-82.9   Hematocrit           [L]  35.2 %                      36.0-46.0   MCV                       89.9 fl                     78.0-100.0   Platelet Count            193.0 K/uL                  150.0-400.0   Neutrophil %              44.5 %                      43.0-77.0   Lymphocyte %              38.5 %  12.0-46.0   Monocyte %                11.5 %                      3.0-12.0   Eosinophils%              4.9 %                       0.0-5.0   Basophils %               0.6  %                       0.0-3.0   TSH (TSH)   FastTSH                   1.32 uIU/mL                 0.35-5.50  Comments:      UDip w/Micro (URINE)   Color                     DK. YELLOW   Clarity                   CLEAR                       Clear   Specific Gravity          1.015                       1.000 - 1.030   Urine Ph                  5.5                         5.0-8.0   Protein                   NEGATIVE                    Negative   Urine Glucose             NEGATIVE                    Negative   Ketones                   NEGATIVE                    Negative   Urine Bilirubin           NEGATIVE                    Negative   Blood                     NEGATIVE                    Negative   Urobilinogen              0.2                         0.0 - 1.0   Leukocyte Esterace        NEGATIVE  Negative   Nitrite                   POSITIVE                    Negative   Urine WBC                 0-2/hpf                     0-2/hpf   Urine Epith               Rare(0-4/hpf)               Rare(0-4/hpf)   Urine Bacteria            Few(10-50/hpf)              None  Culture, Urine (07371) ! COLONY COUNT:       RESULT: >=100,000 COLONIES/ML ! FINAL REPORT       RESULT: STAPHYLOCOCCUS SPECIES (COAGULASE NEGATIVE)  Tests: (2) Sensitivity for: STAPHYLOCOCCUS SPECIES (COAGULASE NEGATIVE) (SSPECN) ! PENICILLIN                R, 0.25 ! OXACILLIN                 R, >=4 ! CEFAZOLIN                 R ! GENTAMICIN                S, <=0.5 ! CIPROFLOXACIN             S, <=0.5 ! LEVOFLOXACIN              S, 0.5 ! NITROFURANTOIN            S, <=16 ! VANCOMYCIN                S, <=0.5 ! RIFAMPIN                  S, <=0.5 ! TETRACYCLINE              S, <=1  Impression & Recommendations:  Problem # 1:  UTI (ICD-599.0) We decided to treat w/ Keflex >> ?reaction? therefore switched to Cipro... The following medications were removed from the medication list:    Zithromax 250  Mg Tabs (Azithromycin) .Marland Kitchen... Take one tab by mouth twice daily for the first day then one tab by mouth daily Her updated medication list for this problem includes:    Keflex 500 Mg Caps (Cephalexin) .Marland Kitchen... Take one tablet by mouth three times a day until gone    Cipro 500 Mg Tabs (Ciprofloxacin hcl) .Marland Kitchen... 1 by mouth two times a day until gone  Problem # 2:  HYPERTENSION (ICD-401.9) Controlled-  same med. Her updated medication list for this problem includes:    Lasix 20 Mg Tabs (Furosemide) .Marland Kitchen... Take 1 tablet by mouth once a day  Orders: Prescription Created Electronically [id number]) TLB-BMP (Basic Metabolic Panel-BMET) (80048-METABOL) TLB-CBC Platelet - w/Differential (85025-CBCD) TLB-TSH (Thyroid Stimulating Hormone) (84443-TSH)  Problem # 3:  PERIPHERAL VASCULAR DISEASE (ICD-443.9) Continue ASA 81mg /d...  Problem # 4:  GERD (ICD-530.81) Continue Nexium daily... Her updated medication list for this problem includes:    Nexium 40 Mg Cpdr (Esomeprazole magnesium) .Marland Kitchen... Take 1 capsule by mouth daily (30 min before the 1st meal of the day)    Librax 2.5-5 Mg Caps (Clidinium-chlordiazepoxide) .Marland Kitchen... Take 1  cap by mouth three times a day as needed abd cramping...  Problem # 5:  HEADACHE (ICD-784.0) [facility]... refill Vicodin per request... Her updated medication list for this problem includes:    Imitrex 100 Mg Tabs (Sumatriptan succinate) .Marland Kitchen... As needed for migraines    Vicodin Es 7.5-750 Mg Tabs (Hydrocodone-acetaminophen) .Marland Kitchen... Take 1 tablet by mouth every 8 h as needed for severe headaches...  Orders: Prescription Created Electronically [phone])  Problem # 6:  OTHER MEDICAL ISSUES AS NOTED>>>  Complete Medication List: 1)  Lasix 20 Mg Tabs (Furosemide) .... Take 1 tablet by mouth once a day 2)  Nexium 40 Mg Cpdr (Esomeprazole magnesium) .... Take 1 capsule by mouth daily (30 min before the 1st meal of the day) 3)  Librax 2.5-5 Mg Caps  (Clidinium-chlordiazepoxide) .... Take 1 cap by mouth three times a day as needed abd cramping... 4)  Topamax 200 Mg Tabs (Topiramate) .... 2 tabs by mouth once daily 5)  Zoloft 100 Mg Tabs (Sertraline hcl) .... Take 2 tablets once daily 6)  Flexeril 10 Mg Tabs (Cyclobenzaprine hcl) .... 2 tabs by mouth once daily as needed 7)  Imitrex 100 Mg Tabs (Sumatriptan succinate) .... As needed for migraines 8)  Klonopin 0.5 Mg Tabs (Clonazepam) .... Take 1/2 - 1 tab by mouth three times a day as needed for nerves 9)  Vicodin Es 7.5-750 Mg Tabs (Hydrocodone-acetaminophen) .... Take 1 tablet by mouth every 8 h as needed for severe headaches... 10)  Oscal 500/200 D-3 500-200 Mg-unit Tabs (Calcium-vitamin d) .... Take 1 tablet by mouth two times a day 11)  Epipen 0.3 Mg/0.69ml (1:1000) Devi (Epinephrine hcl (anaphylaxis)) .... Use as directed 12)  Keflex 500 Mg Caps (Cephalexin) .... Take one tablet by mouth three times a day until gone 13)  Cipro 500 Mg Tabs (Ciprofloxacin hcl) .Marland Kitchen.. 1 by mouth two times a day until gone  Other Orders: T-Urine Culture (Spectrum Order) [id number]) TLB-Udip w/ Micro (81001-URINE)  Patient Instructions: 1)  Today we updated your med list- see below.... 2)  We refilled your meds per request... 3)  Today we rechecked your non-fasting blood work... please call the "phone tree" in a few days for your lab results.Marland KitchenMarland Kitchen 4)  Try to increase your activity level & exercise program... 5)  Please schedule a follow-up appointment in 6 months. Prescriptions: EPIPEN 0.3 MG/0.3ML (1:1000) DEVI (EPINEPHRINE HCL (ANAPHYLAXIS)) use as directed  #1 x prn   Entered and Authorized by:   Michele Mcalpine MD   Signed by:   Michele Mcalpine MD on 03/10/2010   Method used:   Print then Give to Patient   RxID:   [id number] VICODIN ES 7.5-750 MG  TABS (HYDROCODONE-ACETAMINOPHEN) Take 1 tablet by mouth every 8 H as needed for severe headaches...  #50 x 5   Entered and Authorized by:   Michele Mcalpine  MD   Signed by:   Michele Mcalpine MD on 03/10/2010   Method used:   Print then Give to Patient   RxID:   [id number] KLONOPIN 0.5 MG  TABS (CLONAZEPAM) take 1/2 - 1 tab by mouth three times a day as needed for nerves  #270 x 1   Entered and Authorized by:   Michele Mcalpine MD   Signed by:   Michele Mcalpine MD on 03/10/2010   Method used:   Print then Give to Patient   RxID:   [id number] LIBRAX 2.5-5 MG CAPS (CLIDINIUM-CHLORDIAZEPOXIDE)  take 1 cap by mouth three times a day as needed abd cramping...  #100 x prn   Entered and Authorized by:   Michele Mcalpine MD   Signed by:   Michele Mcalpine MD on 03/10/2010   Method used:   Print then Give to Patient   RxID:   [id number] NEXIUM 40 MG  CPDR (ESOMEPRAZOLE MAGNESIUM) Take 1 capsule by mouth daily (30 min before the 1st meal of the day)  #90 x 3   Entered and Authorized by:   Michele Mcalpine MD   Signed by:   Michele Mcalpine MD on 03/10/2010   Method used:   Print then Give to Patient   RxID:   [id number] LASIX 20 MG  TABS (FUROSEMIDE) Take 1 tablet by mouth once a day  #90 x 3   Entered and Authorized by:   Michele Mcalpine MD   Signed by:   Michele Mcalpine MD on 03/10/2010   Method used:   Print then Give to Patient   RxID:   [id number]

## 2010-12-28 NOTE — Medication Information (Signed)
Summary: [facility]  [facility]   Imported By: Lester Hull 08/24/2010 08:23:55  _____________________________________________________________________  External Attachment:    Type:   Image     Comment:   External Document

## 2010-12-28 NOTE — Medication Information (Signed)
Summary: Pantoprazole / [employer]  Pantoprazole / [employer]   Imported By: Lennie Odor 08/25/2010 11:08:41  _____________________________________________________________________  External Attachment:    Type:   Image     Comment:   External Document

## 2010-12-28 NOTE — Progress Notes (Signed)
Summary: call the nurse/cb  Phone Note Call from Patient Call back at [phone]   Caller: Patient Call For: Marcha Licklider Summary of Call: pt wants to speak to leigh Initial call taken by: Lacinda Axon,  November 04, 2010 12:58 PM  Follow-up for Phone Call        duplicate message from yesterday---see that note Randell Loop [facility]  November 04, 2010 2:18 PM

## 2010-12-28 NOTE — Progress Notes (Signed)
Summary: 3 mo supply on meds for [facility] mailed to patient  Phone Note Call from Patient   Caller: Patient Call For: NADEL Summary of Call: NEED TO DISCUSS PRESCRIPTIONS THAT SHE RECEIVED YESTERDAY AT [facility] WITH DR NADEL. Initial call taken by: Rickard Patience,  October 20, 2010 11:40 AM  Follow-up for Phone Call        Pt is going to be getting all of her meds through [facility] and will need all RX's written for 90 day supply, except for the Epipen and Vicodin, then mailed to her home. Address was confirmed with the patient. These were reprinted for 90 day supply and given to [facility] for SN signature.  Follow-up by: Michel Bickers CMA,  October 20, 2010 11:50 AM    Prescriptions: KLONOPIN 0.5 MG  TABS (CLONAZEPAM) take 1/2 - 1 tab by mouth three times a day as needed for nerves  #90 x 4   Entered by:   Michel Bickers CMA   Authorized by:   Michele Mcalpine MD   Signed by:   Michel Bickers CMA on 10/20/2010   Method used:   Print then Mail to Patient   RxID:   [id number] DICYCLOMINE HCL 20 MG TABS (DICYCLOMINE HCL) take 1 tab by mouth every 6 H as needed for abd cramping...  #360 x 4   Entered by:   Michel Bickers CMA   Authorized by:   Michele Mcalpine MD   Signed by:   Michel Bickers CMA on 10/20/2010   Method used:   Print then Mail to Patient   RxID:   [id number] PANTOPRAZOLE SODIUM 40 MG TBEC (PANTOPRAZOLE SODIUM) take one tablet by mouth 30 mins prior to the first meal of the day  #90 x 4   Entered by:   Michel Bickers CMA   Authorized by:   Michele Mcalpine MD   Signed by:   Michel Bickers CMA on 10/20/2010   Method used:   Print then Mail to Patient   RxID:   [id number] LASIX 20 MG  TABS (FUROSEMIDE) Take 1 tablet by mouth once a day  #90 x 4   Entered by:   Michel Bickers CMA   Authorized by:   Michele Mcalpine MD   Signed by:   Michel Bickers CMA on 10/20/2010   Method used:   Print then Mail to Patient   RxID:   [id number]

## 2010-12-28 NOTE — Progress Notes (Signed)
Summary: rx - reaction   [id number]  Phone Note Call from Patient Call back at [phone]   Caller: Patient Call For: nadel Reason for Call: Talk to Nurse Summary of Call: pt has a bladder infection, had reaction to meds that were given, can you call in something different?  Thinks it may have had pennicillan in it. [facility] Initial call taken by: Eugene Gavia,  March 18, 2010 10:36 AM  Follow-up for Phone Call        [facility]  Aundra Millet Reynolds LPN  March 18, 2010 11:32 AM   called and spoke with pt. pt states she was given rx for Keflex on 03/15/2010 for bladder infection.  Pt states her face broke out in hives  and itched.  Pt denied any facial swelling or throat/tongue swelling or increased sob.  Pt also c/o "upset stomach."  Pt states she took Benadryl which helped with the hives.  Pt would like a different abx sent to pharmacy.  Pt also states she cannot take Amoxicillin d/t hives.  please advise.  Aundra Millet Reynolds LPN  March 18, 2010 11:38 AM   Additional Follow-up for Phone Call Additional follow up Details #1::        per SN---ok for pt to have cipro 500mg   #14  1 by mouth two times a day until gone.  thanks Randell Loop CMA  March 18, 2010 2:21 PM    New Allergies: ! AMOXICILLIN ! [facility] Additional Follow-up for Phone Call Additional follow up Details #2::    Rx for cipro was sent to pharmacy.  LMOM for pt to be made aware.  Advised that she call back if questions. Follow-up by: Vernie Murders,  March 18, 2010 2:26 PM  New/Updated Medications: CIPRO 500 MG TABS (CIPROFLOXACIN HCL) 1 by mouth two times a day until gone New Allergies: ! AMOXICILLIN ! KEFLEXPrescriptions: CIPRO 500 MG TABS (CIPROFLOXACIN HCL) 1 by mouth two times a day until gone  #14 x 0   Entered by:   Vernie Murders   Authorized by:   Michele Mcalpine MD   Signed by:   Vernie Murders on 03/18/2010   Method used:   Electronically to        [facility] [phone]* (retail)       [address]       [place], [place]  [address]       Ph: [phone] or [phone]       Fax: [phone]   RxID:   [id number]

## 2010-12-28 NOTE — Assessment & Plan Note (Signed)
Summary: 6 months/apc   CC:  6 month ROV & review of mult medical problems....  History of Present Illness: 60 y/o BF here for a follow up visit... she has multiple medical problems as noted below...     ~  June 08, 2009:  here for a 58mo ROV & refill of meds... she notes some intermittent difficulty w/ sinusitis & has seen ENT in the past... similarly c/o intermittent GI prob w/ swallowing, & IBS symptoms- prev eval DrBrodie w/ chr PUD & GOO but had surg 2003 by DrToth w/ duod stricture-plasty and vagotomy--- offered f/u w/ DrDBrodie & she will decide... she notes [facility] eval in 2009 w/ scans and ?cardiac eval at [facility] (we don't have these notes and will call for records to review)...   ~  March 10, 2010:  she has mult somatic complaints- fatigue, ?uti, sinus drainage, dizziness she thinks is secondary to the Nasonex, etc... requesting refills & new epipen... we discussed f/u labs (all OK), and UA (prob UTI Rx'd).   ~  October 19, 2010:  she has mult somatic complaints and a generally pos ROS... she has GERD & IBS and notes abd discomfort w/ N & D, noctural cough, occas choking while eating> she needs f/u w/ GI= DrDBrodie & we will set this up & change her Librax to Bentyl to see if this works better for her...  BP is controlled on the Lasix;  she remains on the same HA regimen from DrHagen [facility], & requests refill Klonopin for her nerves...     Current Problem List:  ALLERGIC RHINITIS (ICD-477.9) - uses OTC antihistamines as needed.... seen by DrLawrence & DrSJacobs for ENT in the past... also seen by DrESL in 2005 w/ pos reactions to shellfish, dust, trees, grass, weeds... she wants epipen to keep on hand.  Hx of PULMONARY NODULE (ICD-518.89) - prev CTChest in 2005, 2006, & 2007 w/ several small 4-55mm right middle and lower lobe nodules without change, felt to be benign...  ~  CXR 7/10 showed Clear & NAD (mild tort Ao)...  HYPERTENSION (ICD-401.9) - hx ACE cough in past... controlled  on LASIX 20mg /d... BP= 128/82 today, takes meds regularly & tolerates well... denies fatigue, visual changes, CP, palipit, dizziness, syncope, dyspnea, edema, etc...  ~  2[facility]cho 12/08 at [facility] showed norm LVF, mild MR, no source for emboli etc...  PERIPHERAL VASCULAR DISEASE (ICD-443.9) - CTChest in 2007 also showed ectasia of asc thor Ao... rec to take ASA 81mg /d...  ~  CDopplers 12/08 at [facility] showed mild soft paque in CCAs bilat and 0-39% bilat ICA stenoses...  GERD (ICD-530.81) - on PROTONIX 40mg - 1daily... hx chronic PUD w/ gastric outlet obstruction in past... last EGD 10/01 by DrDBrodie showed duod stricture and balloon dilatation performed... subseq surgery in 2003 by DrToth w/ duod stricture-plasty & vagotomy...  IRRITABLE BOWEL SYNDROME (ICD-564.1) - on LIBRAX Qid Prn...  ~  last colon in EMR= 11/99 & neg/ WNL...  ~  11/11: she notes incr IBS symptoms & we decided to switch Librax to BENTYL 20mg  every 4-6H as needed.  LOW BACK PAIN SYNDROME (ICD-724.2)  HEADACHE (ICD-784.0) - followed for yrs in the [facility] now)... currently taking: ZOLOFT 100mg - 2tabsQam,  TOPAMAX 200mg - 2tabdQhs,  IMITREX 100mg Prn,  FLEXERIL 10mg - 1-2 QhsPrn, & AMBIEN 10mg  Prn... she tells me that [facility] requires that Rx for narcotics be filled by the pt's primary physician- she is requesting VICODIN ES 7.5mg ...  ~  [facility]  note 4/10 reviewed...  ANXIETY (ICD-300.00) - on KLONOPIN 0.5mg - 1/2-1 tabTid Prn... she wants Rx for #270 for mail order... I explained to her that she needs ROV Q12mo for monitoring & to get these perscriptions refilled...  VITAMIN D DEFICIENCY (ICD-268.9) - now on Vit D OTC 1000 u daily...  ~  labs 5/09 showed Vit D level = 7... started Vit D 50000 u weekly.  ~  labs 1/10 showed Vit D level = 67... OK to switch to 1000 u OTC daily...   Preventive Screening-Counseling & Management  Alcohol-Tobacco     Smoking Status: never  Allergies: 1)  ! Lisinopril  (Lisinopril) 2)  ! Amoxicillin 3)  ! Keflex  Comments:  Nurse/Medical Assistant: The patient's medications and allergies were reviewed with the patient and were updated in the Medication and Allergy Lists.  Past History:  Past Medical History: ALLERGIC RHINITIS (ICD-477.9) Hx of PULMONARY NODULE (ICD-518.89) HYPERTENSION (ICD-401.9) PERIPHERAL VASCULAR DISEASE (ICD-443.9) GERD (ICD-530.81) IRRITABLE BOWEL SYNDROME (ICD-564.1) LOW BACK PAIN SYNDROME (ICD-724.2) HEADACHE (ICD-784.0) ANXIETY (ICD-300.00) VITAMIN D DEFICIENCY (ICD-268.9)  Past Surgical History: S/P T & A as a child S/P bilateral tubal ligation 1984 S/P hysterectomy S/P PUD surg w/ duod stricture-plasty & vagotomy 2003 by DrToth  Family History: Reviewed history from 10/16/2009 and no changes required. mother deceased age 74 from natural causes father deceased unsure of age from aneurysm 1 brother alive age 57  hx of prostate cancer that is in remission caused from agent orange, hearing loss secondary to [place] War injury.  Social History: Reviewed history from 06/08/2009 and no changes required. Patient has never smoked.  exposed to second hand smoke---her husband smokes exercises 3 times per week caffeine use daily alcohol use rare married 1 son  Review of Systems      See HPI       The patient complains of dyspnea on exertion, abdominal pain, severe indigestion/heartburn, and muscle weakness.  The patient denies anorexia, fever, weight loss, weight gain, vision loss, decreased hearing, hoarseness, chest pain, syncope, peripheral edema, prolonged cough, headaches, hemoptysis, melena, hematochezia, hematuria, incontinence, suspicious skin lesions, transient blindness, difficulty walking, depression, unusual weight change, abnormal bleeding, enlarged lymph nodes, and angioedema.    Vital Signs:  Patient profile:   60 year old female Height:      64 inches Weight:      132.25 pounds BMI:      22.78 O2 Sat:      97 % on Room air Temp:     99.1 degrees F oral Pulse rate:   92 / minute BP sitting:   124 / 76  (left arm) Cuff size:   regular  Vitals Entered By: Randell Loop CMA (October 19, 2010 3:49 PM)  O2 Sat at Rest %:  97 O2 Flow:  Room air CC: 6 month ROV & review of mult medical problems... Is Patient Diabetic? No Pain Assessment Patient in pain? no      Comments meds updated today with pt   Physical Exam  Additional Exam:  WD, WN, 59 y/o BF in NAD... weight = 132# stable... GENERAL:  Alert & oriented; pleasant & cooperative... HEENT:  [place]/AT, EOM-wnl, PERRLA, EACs-clear, TMs-wnl, NOSE-clear, THROAT-clear & wnl, sl sinus tender on palp. NECK:  Supple w/ fairROM; no JVD; normal carotid impulses w/o bruits; no thyromegaly or nodules palpated; no lymphadenopathy. CHEST:  Clear to P & A; without wheezes/ rales/ or rhonchi heard... HEART:  Regular Rhythm; without murmurs/ rubs/ or gallops. ABDOMEN:  Soft &  nontender; normal bowel sounds; no organomegaly or masses detected. EXT: without deformities or arthritic changes; no varicose veins/ venous insuffic/ or edema. NEURO:  CN's intact;  no focal neuro deficits... DERM:  No lesions noted; no rash etc...    Impression & Recommendations:  Problem # 1:  HYPERTENSION (ICD-401.9) Stable BP>  continue same med. Her updated medication list for this problem includes:    Lasix 20 Mg Tabs (Furosemide) .Marland Kitchen... Take 1 tablet by mouth once a day  Problem # 2:  GERD (ICD-530.81) We will ref to GI for further eval & reassurance... Her updated medication list for this problem includes:    Pantoprazole Sodium 40 Mg Tbec (Pantoprazole sodium) .Marland Kitchen... Take one tablet by mouth 30 mins prior to the first meal of the day    Dicyclomine Hcl 20 Mg Tabs (Dicyclomine hcl) .Marland Kitchen... Take 1 tab by mouth every 6 h as needed for abd cramping...  Problem # 3:  HEADACHE (ICD-784.0) Followed by DrHagen at the [facility]... continue her Rx... Her  updated medication list for this problem includes:    Vicodin Es 7.5-750 Mg Tabs (Hydrocodone-acetaminophen) .Marland Kitchen... Take 1 tablet by mouth every 8 h as needed for severe headaches...    Imitrex 100 Mg Tabs (Sumatriptan succinate) .Marland Kitchen... As needed for migraines  Problem # 4:  ANXIETY (ICD-300.00) Meds refilled per request... Her updated medication list for this problem includes:    Klonopin 0.5 Mg Tabs (Clonazepam) .Marland Kitchen... Take 1/2 - 1 tab by mouth three times a day as needed for nerves    Zoloft 100 Mg Tabs (Sertraline hcl) .Marland Kitchen... Take 2 tablets once daily  Problem # 5:  OTHER MEDICAL PROBLEMS AS NOTED>>>  Complete Medication List: 1)  Lasix 20 Mg Tabs (Furosemide) .... Take 1 tablet by mouth once a day 2)  Pantoprazole Sodium 40 Mg Tbec (Pantoprazole sodium) .... Take one tablet by mouth 30 mins prior to the first meal of the day 3)  Dicyclomine Hcl 20 Mg Tabs (Dicyclomine hcl) .... Take 1 tab by mouth every 6 h as needed for abd cramping... 4)  Vicodin Es 7.5-750 Mg Tabs (Hydrocodone-acetaminophen) .... Take 1 tablet by mouth every 8 h as needed for severe headaches... 5)  Topamax 200 Mg Tabs (Topiramate) .... 2 tabs by mouth once daily 6)  Imitrex 100 Mg Tabs (Sumatriptan succinate) .... As needed for migraines 7)  Flexeril 10 Mg Tabs (Cyclobenzaprine hcl) .... 2 tabs by mouth once daily as needed 8)  Klonopin 0.5 Mg Tabs (Clonazepam) .... Take 1/2 - 1 tab by mouth three times a day as needed for nerves 9)  Zoloft 100 Mg Tabs (Sertraline hcl) .... Take 2 tablets once daily 10)  Oscal 500/200 D-3 500-200 Mg-unit Tabs (Calcium-vitamin d) .... Take 1 tablet by mouth two times a day 11)  Epipen 0.3 Mg/0.37ml (1:1000) Devi (Epinephrine hcl (anaphylaxis)) .... Use as directed 12)  Meclizine Hcl 25 Mg Tabs (Meclizine hcl) .... Take one tablet by mouth every 6 hours as needed for dizziness  Other Orders: Gastroenterology Referral (GI)  Patient Instructions: 1)  Today we updated your med list- see  below.... 2)  We refilled the meds you requested & we changed the Librax to DICYLOMINE 20mg  take 1 every 6H as needed for abd cramping... 3)  We will arrange for a GI evaluation by DrDBrodie.Marland KitchenMarland Kitchen 4)  Call for any questions.Marland KitchenMarland Kitchen 5)  Please schedule a follow-up appointment in 6 months. Prescriptions: MECLIZINE HCL 25 MG TABS (MECLIZINE HCL) take one tablet by  mouth every 6 hours as needed for dizziness  #30 x 11   Entered and Authorized by:   Michele Mcalpine MD   Signed by:   Michele Mcalpine MD on 10/24/2010   Method used:   Historical   RxID:   [id number] EPIPEN 0.3 MG/0.3ML (1:1000) DEVI (EPINEPHRINE HCL (ANAPHYLAXIS)) use as directed  #1 x prn   Entered by:   Randell Loop CMA   Authorized by:   Michele Mcalpine MD   Signed by:   Randell Loop CMA on 10/19/2010   Method used:   Print then Give to Patient   RxID:   [id number] KLONOPIN 0.5 MG  TABS (CLONAZEPAM) take 1/2 - 1 tab by mouth three times a day as needed for nerves  #90 x 6   Entered and Authorized by:   Michele Mcalpine MD   Signed by:   Michele Mcalpine MD on 10/19/2010   Method used:   Print then Give to Patient   RxID:   [id number] VICODIN ES 7.5-750 MG  TABS (HYDROCODONE-ACETAMINOPHEN) Take 1 tablet by mouth every 8 H as needed for severe headaches...  #90 x 6   Entered and Authorized by:   Michele Mcalpine MD   Signed by:   Michele Mcalpine MD on 10/19/2010   Method used:   Print then Give to Patient   RxID:   [id number] PANTOPRAZOLE SODIUM 40 MG TBEC (PANTOPRAZOLE SODIUM) take one tablet by mouth 30 mins prior to the first meal of the day  #90 x 4   Entered and Authorized by:   Michele Mcalpine MD   Signed by:   Michele Mcalpine MD on 10/19/2010   Method used:   Print then Give to Patient   RxID:   [id number] LASIX 20 MG  TABS (FUROSEMIDE) Take 1 tablet by mouth once a day  #90 x 4   Entered and Authorized by:   Michele Mcalpine MD   Signed by:   Michele Mcalpine MD on 10/19/2010   Method used:   Print then Give to  Patient   RxID:   [id number] DICYCLOMINE HCL 20 MG TABS (DICYCLOMINE HCL) take 1 tab by mouth every 6 H as needed for abd cramping...  #100 x 6   Entered and Authorized by:   Michele Mcalpine MD   Signed by:   Michele Mcalpine MD on 10/19/2010   Method used:   Print then Give to Patient   RxID:   [id number]

## 2010-12-28 NOTE — Progress Notes (Signed)
Summary: Letter to be out of work  Phone Note Call from Patient Call back at [phone]   Caller: Patient Call For: Kriste Basque Reason for Call: Talk to Nurse Summary of Call: Pt states she is trying to get "written out of work" for Migraines. Initial call taken by: Zackery Barefoot CMA,  November 03, 2010 11:55 AM  Follow-up for Phone Call        per 11.22.11 ov w/ SN, pt sees Dr. Sharene Skeans at the [facility] for her migraines.    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  would like letter excusing her from work 2-3weeks similar to short-term disability.  pt reports she has FMLA for this: "3 days at a stretch for 6 times a month" by the HA center.   please advise if okay for letter, thanks! Follow-up by: Boone Master CNA/MA,  November 03, 2010 3:04 PM  Additional Follow-up for Phone Call Additional follow up Details #1::        pt called back---wanting to check to see if SN made a decision about giving her some time off from work due to her headaches.  explained to pt that i will have to check with SN and i will call her back to let her know.  pt ok with this. Randell Loop [facility]  November 04, 2010 2:20 PM     Additional Follow-up for Phone Call Additional follow up Details #2::    called and lmom for pt to make her aware that SN will not write for her to be out of work as pt is requesting---she is followed by the [facility] clinic and neurology and she will need to follow up with neurology since she stated that her HA are getting progressivly worse.  explained this to the pt in the message and told her to call me if she has any questions Randell Loop [facility]  November 04, 2010 3:44 PM    Appended Document: Letter to be out of work pt returned my call  and she stated that she is followed by the HA clinic every 3 months----and this is with a neurologist---she stated that they will not write her out of work for her HA---they told her she needs to be out doing things even with the HA---she stated that this is not a good time of year for her and she just needs to be out of work until [place] 2---she is requesting to speak with SN about this if he needs her to.  she is aware that i will call her back in the am with his decision.  Appended Document: Letter to be out of work lmomtcb for pt to make her aware that per SN---she will need to follow back up with neurology for this letter.  Appended Document: Letter to be out of work pt called and she spoke with Scientist, research (medical) and jennifer informed her of SN recs---she wanted to speak directly with me---before i could call her she called back and i did speak with her and explained to her that SN will not write her out of work since she is followed for her HA from the headache clinic and neuro--she stated that they will not  write her out of work due to that is against their policy and they told her to call back to SN---i told her per SN that since her headaches are gettting worse that she will have to follow up with neuro/HA clinic---she stated that this is very unfair and wanted to know who she could get to write her out of work and i explained to her again that she will have to follow up with ha clinic/neuro for this and that SN will not write her out of work since he does not follow her for this---pt was not happy about this but stated that this was ok and she would call back to the ha clinic/neuro

## 2010-12-28 NOTE — Letter (Signed)
Summary: New Patient letter  [facility]  [address] [place], [place] [address]   Phone: [phone]  Fax: [phone]       01/01/2010 MRN: [id number]  [facility] [address] [place], [place]  [address]  Dear Ms. Carbin,  Welcome to the Gastroenterology Division at [facility].    You are scheduled to see Dr.  Juanda Chance on 02/25/2010 at 9:15AM on the 3rd floor at [facility], [address]  We ask that you try to arrive at our office 15 minutes prior to your appointment time to allow for check-in.  We would like you to complete the enclosed self-administered evaluation form prior to your visit and bring it with you on the day of your appointment.  We will review it with you.  Also, please bring a complete list of all your medications or, if you prefer, bring the medication bottles and we will list them.  Please bring your insurance card so that we may make a copy of it.  If your insurance requires a referral to see a specialist, please bring your referral form from your primary care physician.  Co-payments are due at the time of your visit and may be paid by cash, check or credit card.     Your office visit will consist of a consult with your physician (includes a physical exam), any laboratory testing he/she may order, scheduling of any necessary diagnostic testing (e.g. x-ray, ultrasound, CT-scan), and scheduling of a procedure (e.g. Endoscopy, Colonoscopy) if required.  Please allow enough time on your schedule to allow for any/all of these possibilities.    If you cannot keep your appointment, please call [phone] to cancel or reschedule prior to your appointment date.  This allows [place] the opportunity to schedule an appointment for another patient in need of care.  If you do not cancel or reschedule by 5 p.m. the business day prior to your appointment date, you will be charged a $50.00 late cancellation/no-show fee.    Thank you for choosing  [facility] for your medical needs.  We appreciate the opportunity to care for you.  Please visit [place] at our website  to learn more about our practice.                     Sincerely,                                                             The Gastroenterology Division

## 2010-12-28 NOTE — Progress Notes (Signed)
Summary: Klonopin refill  Phone Note Call from Patient Call back at [phone]   Caller: Patient Call For: nadel Summary of Call: need refill at pharmacy until mail order come for klonopin need generic would like [facility] pills [facility] Initial call taken by: Rickard Patience,  December 21, 2009 10:10 AM  Follow-up for Phone Call        Please advise if okay to fill Klonopin for patient and for what quantity.Michel Bickers CMA  December 21, 2009 11:08 AM  per SN---ok to refill klonipin for pt---#180  1 by mouth two times a day with 1 refill.  thanks Randell Loop CMA  December 21, 2009 11:44 AM   Additional Follow-up for Phone Call Additional follow up Details #1::        Clarified with Leigh on directions. Keep directions at three times a day as needed. Okay for #180. LMOM to contact pharmacy to make sure RX is ready. Additional Follow-up by: Michel Bickers CMA,  December 21, 2009 12:09 PM    Prescriptions: KLONOPIN 0.5 MG  TABS (CLONAZEPAM) take 1/2 - 1 tab by mouth three times a day as needed for nerves  #180 x 1   Entered by:   Michel Bickers CMA   Authorized by:   Michele Mcalpine MD   Signed by:   Michel Bickers CMA on 12/21/2009   Method used:   Telephoned to ...       [facility] [phone]* (retail)       [address]       [place], [place]  [address]       Ph: [phone] or [phone]       Fax: [phone]   RxID:   [id number]

## 2010-12-28 NOTE — Progress Notes (Signed)
Summary: ENT referral-awaiting chart-pt called again  Phone Note Call from Patient   Caller: Patient Call For: Michaela Rios Summary of Call: pt needs name and # of ENT that dr Imari Reen had referred her to. she says she never went and forgot this info. [phone] Initial call taken by: Tivis Ringer,  December 15, 2009 11:16 AM  Follow-up for Phone Call        info not in EMR will request paper chart to review. Carron Curie CMA  December 15, 2009 11:33 AM  cannot find who pt was referred to in the past. Please rec and ENT doctor for the patient. Thanks. Carron Curie CMA  December 16, 2009 9:53 AM   Additional Follow-up for Phone Call Additional follow up Details #1::        pt has called back again. says when you have this info it's ok to leave ms re: ENT's name and # if you have it- on answr machine. Tivis Ringer, CNA  December 16, 2009 12:33 PM   lmomtcb for pt---need to know what kind of problems she is having so we will know what kind of treatment she is needing.,  thanks Randell Loop CMA  December 16, 2009 2:21 PM     Additional Follow-up for Phone Call Additional follow up Details #2::    pt called me back---she stated that she is still having the constant runny nose, headache and sinus problems.  not sure if its allergies.   Randell Loop [facility]  December 16, 2009 4:38 PM

## 2010-12-28 NOTE — Therapy (Signed)
Summary: [facility]  [facility]   Imported By: [facility] 10/14/2010 14:41:13  _____________________________________________________________________  External Attachment:    Type:   Image     Comment:   External Document

## 2011-01-13 ENCOUNTER — Telehealth: Payer: Self-pay | Admitting: Internal Medicine

## 2011-01-19 NOTE — Progress Notes (Signed)
Summary: triage  Phone Note Call from Patient Call back at Home Phone [phone]   Caller: Patient Call For: Dr Juanda Chance Reason for Call: Talk to Nurse Summary of Call: Patient states that she has a lot of stomach pains and wants to see Dr Juanda Chance asap but she has no available appt. Initial call taken by: Tawni Levy,  January 13, 2011 3:55 PM  Follow-up for Phone Call        Message left for patient to call back. Follow-up by: Jesse Fall RN,  January 13, 2011 4:05 PM  Additional Follow-up for Phone Call Additional follow up Details #1::        Spoke with patient. She states she has had a "stomach ache" for a while now. Patient states stomach hurts above the bellly button in the middle of her stomach. States the pain is similar to the pain she had with her ulcers. She is taking generic Pantroprazole 40 mg daily. She was instructed to increase this to two times a day. Offered for patient to be seen by an extender but she declined. Scheduled patient to see Dr. Juanda Chance on 01/24/11 at 2:30 PM. EGD 2004- hiatal hernia, gastritis, gastric ulcer without hemorrhage. Additional Follow-up by: Jesse Fall RN,  January 13, 2011 4:58 PM    Additional Follow-up for Phone Call Additional follow up Details #2::    Pt with peptic duodenal stricture 2002, s/p trunkal vagotomy and stricturoplasty, recurrent prepyloric ulcer in 2004, she may be experiencing recurrent ulcer again. I agree with doudling up on Pantoprazole and adding Carafate 1gm, #30, 1 by mouth qid till she sees me. She will likely need an  EGD Follow-up by: Hart Carwin MD,  January 14, 2011 9:38 AM  Additional Follow-up for Phone Call Additional follow up Details #3:: Details for Additional Follow-up Action Taken: Rx sent to the pharmacy I have left a message for the patient asking her to pick up RX at the pharmacy, keep the appointment for 01/24/11 and to call me back for any questions. Additional Follow-up by: Darcey Nora  RN, CGRN,  January 14, 2011 11:31 AM  New/Updated Medications: SUCRALFATE 1 GM TABS (SUCRALFATE) 1 by mouth qid Prescriptions: SUCRALFATE 1 GM TABS (SUCRALFATE) 1 by mouth qid  #30 x 0   Entered by:   Darcey Nora RN, CGRN   Authorized by:   Hart Carwin MD   Signed by:   Darcey Nora RN, CGRN on 01/14/2011   Method used:   [facility] to        [facility] [address] [phone]* (retail)       [address]       [place], [place]  [address]       Ph: [phone] or [phone]       Fax: [phone]   RxID:   [id number]

## 2011-01-24 ENCOUNTER — Encounter: Payer: Self-pay | Admitting: Internal Medicine

## 2011-01-24 ENCOUNTER — Encounter (INDEPENDENT_AMBULATORY_CARE_PROVIDER_SITE_OTHER): Payer: Self-pay | Admitting: *Deleted

## 2011-01-24 ENCOUNTER — Other Ambulatory Visit: Payer: Self-pay | Admitting: Internal Medicine

## 2011-01-24 ENCOUNTER — Other Ambulatory Visit: Payer: Managed Care, Other (non HMO)

## 2011-01-24 ENCOUNTER — Ambulatory Visit (INDEPENDENT_AMBULATORY_CARE_PROVIDER_SITE_OTHER): Payer: Managed Care, Other (non HMO) | Admitting: Internal Medicine

## 2011-01-24 DIAGNOSIS — R1084 Generalized abdominal pain: Secondary | ICD-10-CM

## 2011-01-24 DIAGNOSIS — R1013 Epigastric pain: Secondary | ICD-10-CM

## 2011-01-24 LAB — CBC WITH DIFFERENTIAL/PLATELET
Eosinophils Relative: 4.3 % (ref 0.0–5.0)
Monocytes Absolute: 0.3 10*3/uL (ref 0.1–1.0)
Monocytes Relative: 8.5 % (ref 3.0–12.0)
Neutrophils Relative %: 36.6 % — ABNORMAL LOW (ref 43.0–77.0)
Platelets: 198 10*3/uL (ref 150.0–400.0)
WBC: 3 10*3/uL — ABNORMAL LOW (ref 4.5–10.5)

## 2011-01-24 LAB — HEPATIC FUNCTION PANEL
AST: 24 U/L (ref 0–37)
Alkaline Phosphatase: 65 U/L (ref 39–117)
Total Bilirubin: 0.6 mg/dL (ref 0.3–1.2)

## 2011-01-24 LAB — LIPASE: Lipase: 29 U/L (ref 11.0–59.0)

## 2011-01-27 ENCOUNTER — Other Ambulatory Visit (AMBULATORY_SURGERY_CENTER): Payer: Managed Care, Other (non HMO) | Admitting: Internal Medicine

## 2011-01-27 ENCOUNTER — Other Ambulatory Visit: Payer: Self-pay | Admitting: Internal Medicine

## 2011-01-27 DIAGNOSIS — K259 Gastric ulcer, unspecified as acute or chronic, without hemorrhage or perforation: Secondary | ICD-10-CM

## 2011-01-27 DIAGNOSIS — K209 Esophagitis, unspecified without bleeding: Secondary | ICD-10-CM

## 2011-01-27 DIAGNOSIS — K319 Disease of stomach and duodenum, unspecified: Secondary | ICD-10-CM

## 2011-01-27 DIAGNOSIS — K2990 Gastroduodenitis, unspecified, without bleeding: Secondary | ICD-10-CM

## 2011-01-27 DIAGNOSIS — K2970 Gastritis, unspecified, without bleeding: Secondary | ICD-10-CM

## 2011-01-27 DIAGNOSIS — R109 Unspecified abdominal pain: Secondary | ICD-10-CM

## 2011-01-28 ENCOUNTER — Telehealth (INDEPENDENT_AMBULATORY_CARE_PROVIDER_SITE_OTHER): Payer: Self-pay | Admitting: *Deleted

## 2011-01-31 ENCOUNTER — Encounter: Payer: Self-pay | Admitting: Internal Medicine

## 2011-02-01 ENCOUNTER — Encounter: Payer: Self-pay | Admitting: Internal Medicine

## 2011-02-03 ENCOUNTER — Ambulatory Visit (HOSPITAL_COMMUNITY)
Admission: RE | Admit: 2011-02-03 | Discharge: 2011-02-03 | Disposition: A | Discharge status: Home / Self Care | Payer: Managed Care, Other (non HMO) | Source: Ambulatory Visit | Attending: Internal Medicine | Admitting: Internal Medicine

## 2011-02-03 DIAGNOSIS — R1084 Generalized abdominal pain: Secondary | ICD-10-CM

## 2011-02-03 DIAGNOSIS — R109 Unspecified abdominal pain: Secondary | ICD-10-CM | POA: Insufficient documentation

## 2011-02-03 DIAGNOSIS — K828 Other specified diseases of gallbladder: Secondary | ICD-10-CM | POA: Insufficient documentation

## 2011-02-03 IMAGING — US US ABDOMEN COMPLETE
1 series · 14 of 25 positions shown · non-contrast
Comparison: None available

CLINICAL DATA: Abdominal pain

ABDOMINAL ULTRASOUND COMPLETE

[Series 1: us abdomen complete · 0.21mm/px · 14 of 83 slices shown]
[im 1/83]
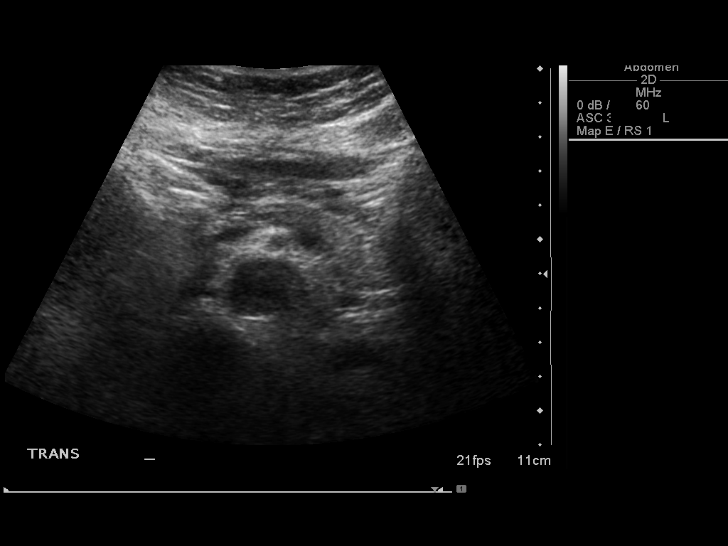
[im 7/83]
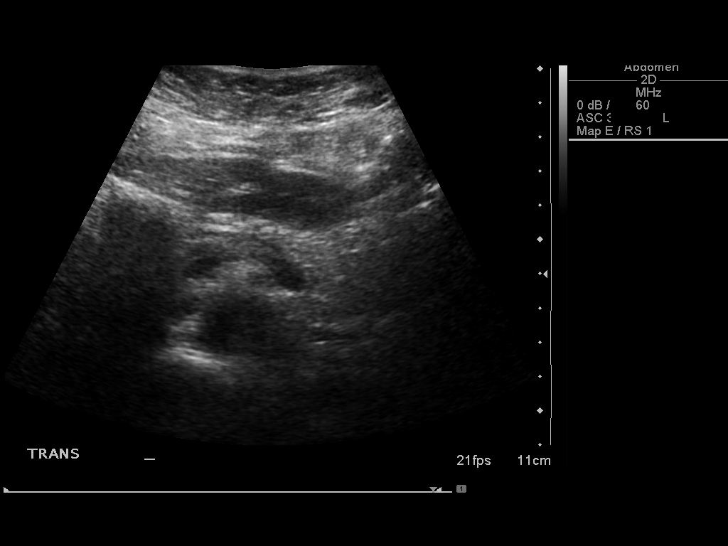
[im 14/83]
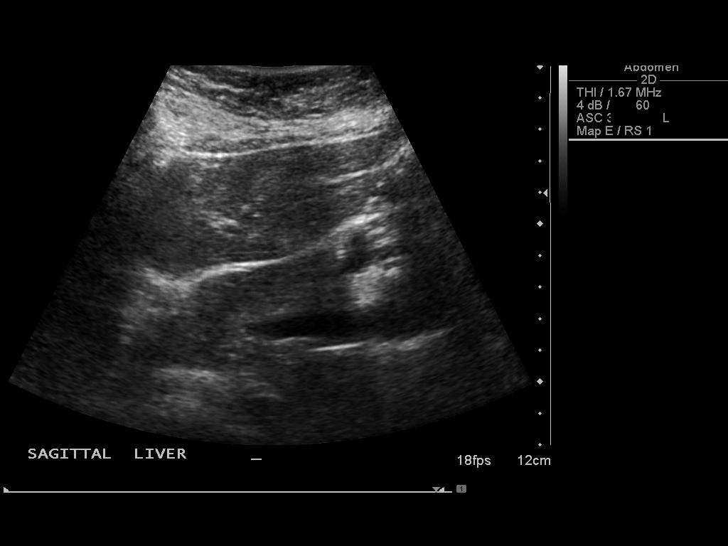
[im 21/83]
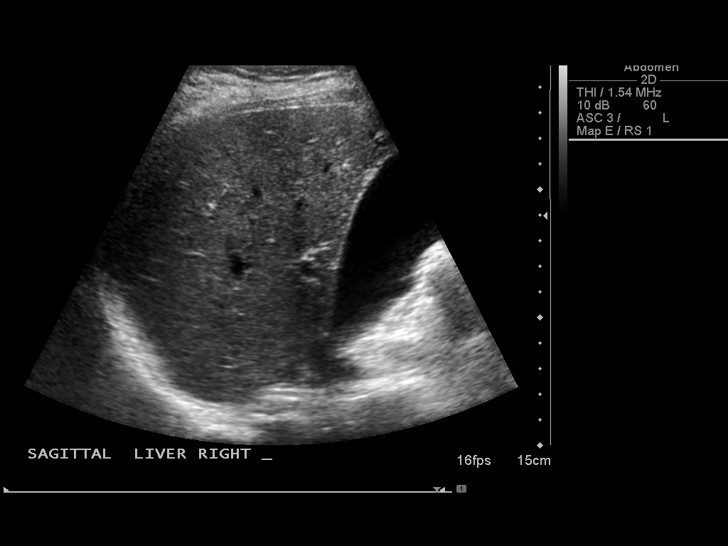
[im 28/83]
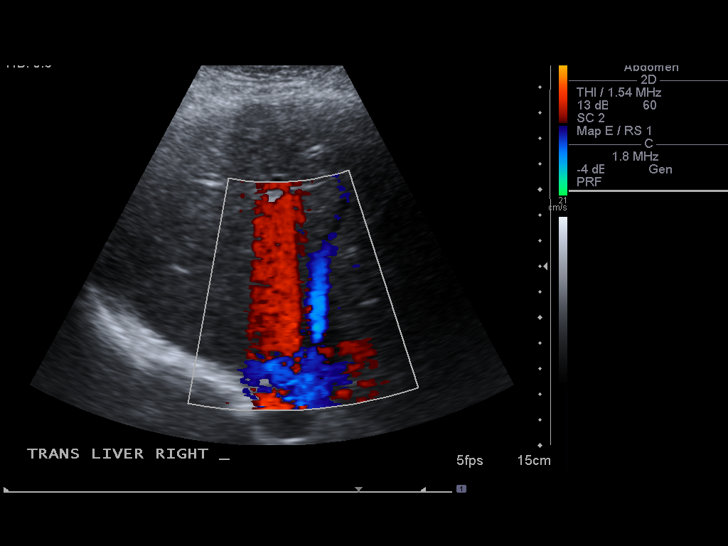
[im 31/83]
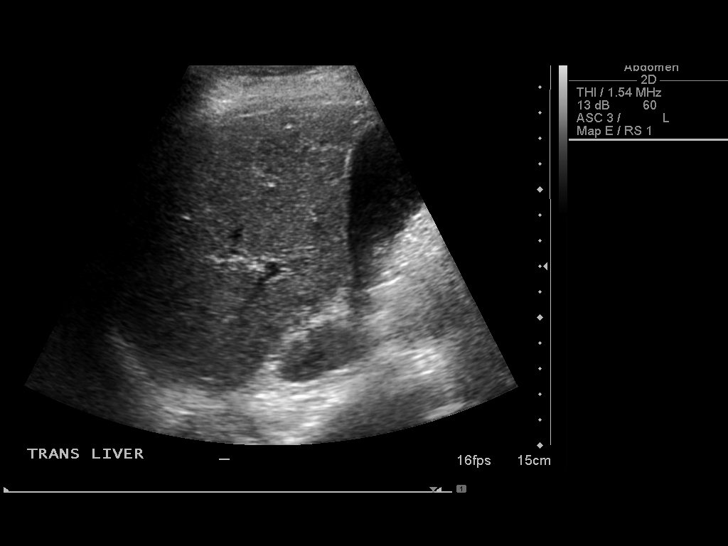
[im 38/83]
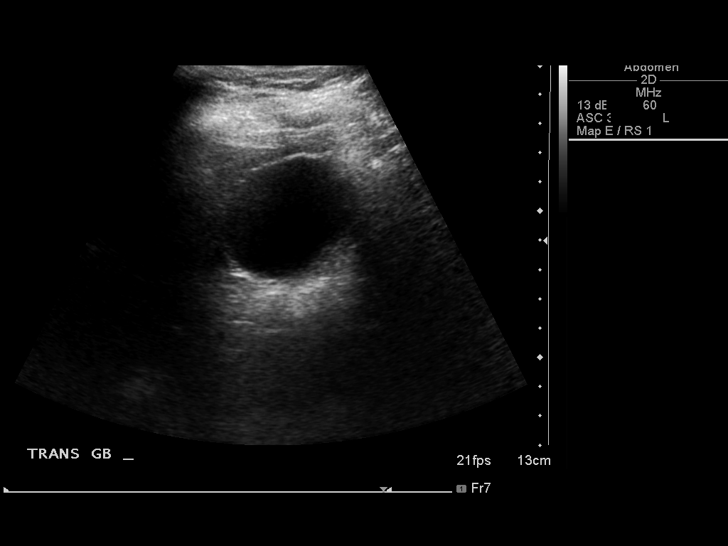
[im 45/83]
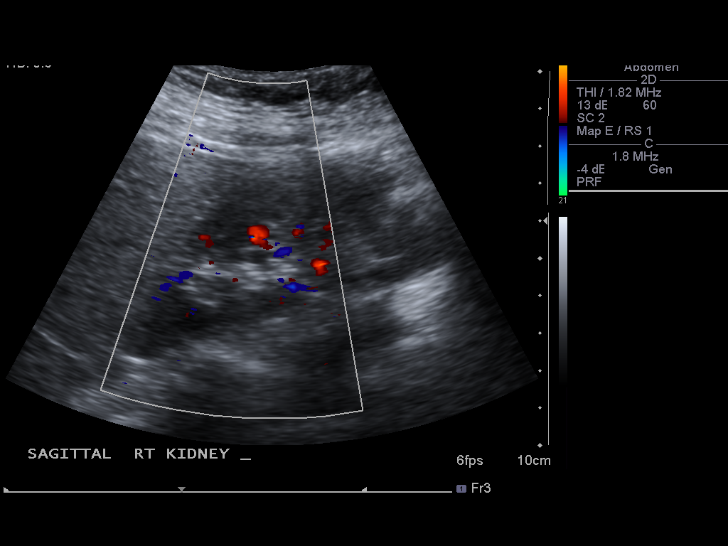
[im 52/83]
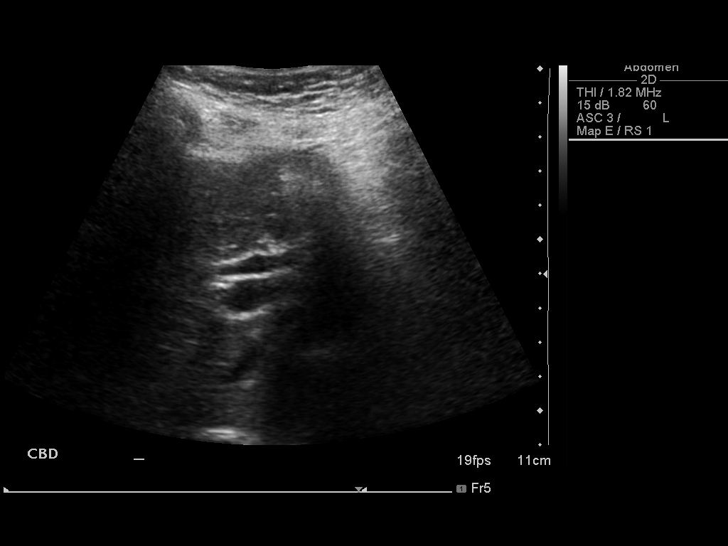
[im 55/83]
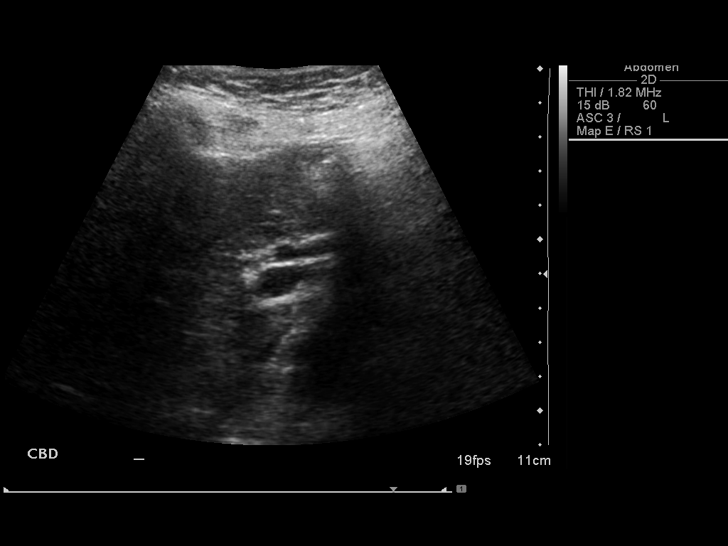
[im 62/83]
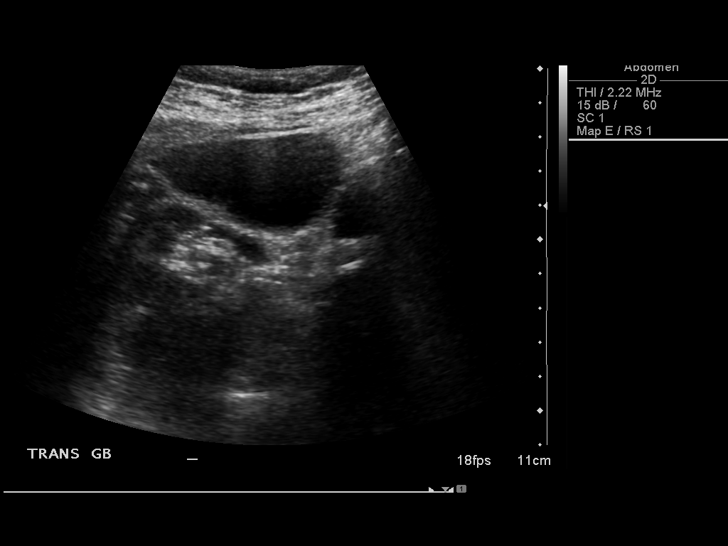
[im 69/83]
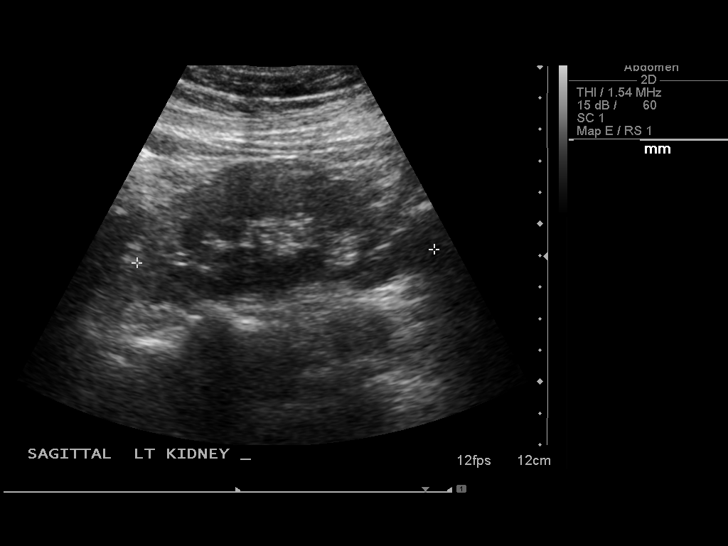
[im 76/83]
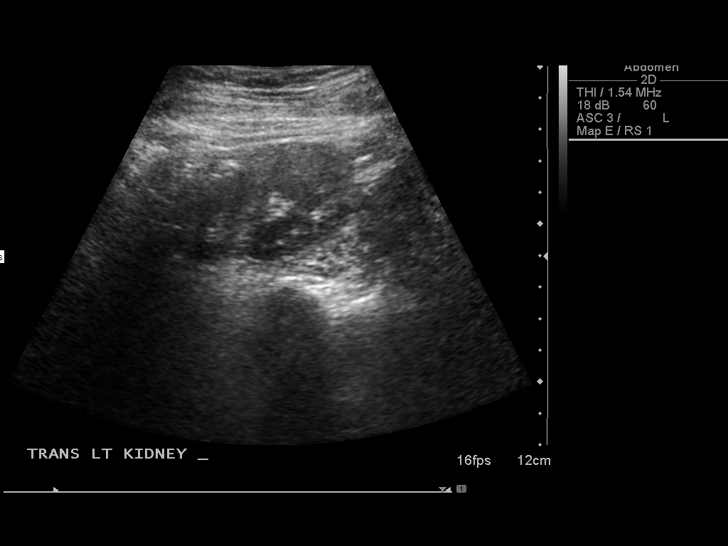
[im 83/83]
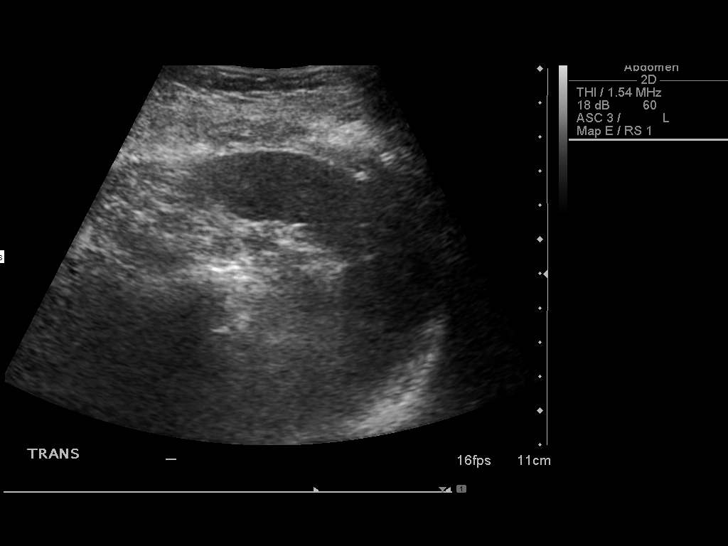

[14 of 25 positions shown; findings below may reference images not displayed]

FINDINGS: Gallbladder:  Moderately distended gallbladder.  No visualized
gallstones, wall thickening or Murphy's sign.

Common Bile Duct:  Within normal limits in caliber.

Liver: No focal mass lesion identified.  Within normal limits in
parenchymal echogenicity.

IVC:  Appears normal.

Pancreas:  Imaged portion unremarkable.  Pancreatic duct is
visualized and noted to be 3.2 mm in diameter.

Spleen:  Within normal limits in size and echotexture.

Right kidney:  Normal in size and parenchymal echogenicity.  No
evidence of mass or hydronephrosis.

Left kidney:  Normal in size and parenchymal echogenicity.  No
evidence of mass or hydronephrosis.

Abdominal Aorta:  No aneurysm identified.
IMPRESSION: Moderately distended gallbladder but negative for cholelithiasis.
No biliary dilatation
No acute finding by ultrasound

## 2011-02-03 NOTE — Letter (Signed)
Summary: EGD Instructions  [facility]  [address] [place], [place] [address]   Phone: [phone]  Fax: [phone]       Michaela Rios    Apr 15, 1951    MRN: [id number]       Procedure Day /Date: Thursday 01/27/11     Arrival Time: 3:00 pm     Procedure Time: 4:00 pm     Location of Procedure:                    _x _ [facility] (4th Floor)  PREPARATION FOR ENDOSCOPY   On 01/27/11 THE DAY OF THE PROCEDURE:  1.   No solid foods, milk or milk products are allowed after midnight the night before your procedure.  2.   Do not drink anything colored red or purple.  Avoid juices with pulp.  No orange juice.  3.  You may drink clear liquids until 2:00 pm, which is 2 hours before your procedure.                                                                                                CLEAR LIQUIDS INCLUDE: Water Jello Ice Popsicles Tea (sugar ok, no milk/cream) Powdered fruit flavored drinks Coffee (sugar ok, no milk/cream) Gatorade Juice: apple, white grape, white cranberry  Lemonade Clear bullion, consomm, broth Carbonated beverages (any kind) Strained chicken noodle soup Hard Candy   MEDICATION INSTRUCTIONS  Unless otherwise instructed, you should take regular prescription medications with a small sip of water as early as possible the morning of your procedure.                   OTHER INSTRUCTIONS  You will need a responsible adult at least 61 years of age to accompany you and drive you home.   This person must remain in the waiting room during your procedure.  Wear loose fitting clothing that is easily removed.  Leave jewelry and other valuables at home.  However, you may wish to bring a book to read or an iPod/MP3 player to listen to music as you wait for your procedure to start.  Remove all body piercing jewelry and leave at home.  Total time from sign-in until discharge is approximately 2-3 hours.  You should  go home directly after your procedure and rest.  You can resume normal activities the day after your procedure.  The day of your procedure you should not:   Drive   Make legal decisions   Operate machinery   Drink alcohol   Return to work  You will receive specific instructions about eating, activities and medications before you leave.    The above instructions have been reviewed and explained to me by   _______________________    I fully understand and can verbalize these instructions _____________________________ Date _________

## 2011-02-03 NOTE — Assessment & Plan Note (Signed)
Summary: Abdominal pain   History of Present Illness Visit Type: Follow-up Visit Primary GI MD: [name] MD Primary Provider: Curly Shores, MD Chief Complaint: Abdominal pain x 3 months History of Present Illness:   This is a 60 year old African American female with a several week history of periumbilical and epigastric abdominal pain. She has a history of peptic ulcer disease and duodenal stricture in 2002. She is status post truncal vagotomy and stricturoplasty in 2002. She had a recurrent prepyloric ulcer in 2004. She has remained on PPI's. She had a recurrence of abdominal pain several weeks ago and we have increased her pantoprazole to 40 mg twice a day and Carafate to 1 g 4 times a day. She is about 30% improved. The pain is constant, anterior and is more in the left upper quadrant. There was a questionable history of pancreatitis in 2004 when her serum amylase was 223 but her upper abdominal ultrasound  at that time was negative.    GI Review of Systems    Reports abdominal pain, acid reflux, chest pain, loss of appetite, nausea, and  vomiting.     Location of  Abdominal pain: epigastric area.    Denies belching, bloating, dysphagia with liquids, dysphagia with solids, heartburn, vomiting blood, weight loss, and  weight gain.      Reports constipation and  diarrhea.     Denies anal fissure, black tarry stools, change in bowel habit, diverticulosis, fecal incontinence, heme positive stool, hemorrhoids, irritable bowel syndrome, jaundice, light color stool, liver problems, rectal bleeding, and  rectal pain.    Current Medications (verified): 1)  Lasix 20 Mg  Tabs (Furosemide) .... Take 1 Tablet By Mouth Once A Day 2)  Pantoprazole Sodium 40 Mg Tbec (Pantoprazole Sodium) .... Take 1 Tablet By Mouth Two Times A Day 3)  Dicyclomine Hcl 20 Mg Tabs (Dicyclomine Hcl) .... Take 1 Tab By Mouth Every 6 H As Needed For Abd Cramping... 4)  Vicodin Es 7.5-750 Mg  Tabs (Hydrocodone-Acetaminophen)  .... Take 1 Tablet By Mouth Every 8 H As Needed For Severe Headaches... 5)  Topamax 200 Mg  Tabs (Topiramate) .... 2 Tabs By Mouth Once Daily 6)  Imitrex 100 Mg  Tabs (Sumatriptan Succinate) .... As Needed For Migraines 7)  Flexeril 10 Mg  Tabs (Cyclobenzaprine Hcl) .... 2 Tabs By Mouth Once Daily As Needed 8)  Klonopin 0.5 Mg  Tabs (Clonazepam) .... Take 1/2 - 1 Tab By Mouth Three Times A Day As Needed For Nerves 9)  Zoloft 100 Mg  Tabs (Sertraline Hcl) .... Take 2 Tablets Once Daily 10)  Oscal 500/200 D-3 500-200 Mg-Unit  Tabs (Calcium-Vitamin D) .... Take 1 Tablet By Mouth Two Times A Day 11)  Epipen 0.3 Mg/0.51ml (1:1000) Devi (Epinephrine Hcl (Anaphylaxis)) .... Use As Directed 12)  Meclizine Hcl 25 Mg Tabs (Meclizine Hcl) .... Take One Tablet By Mouth Every 6 Hours As Needed For Dizziness 13)  Sucralfate 1 Gm Tabs (Sucralfate) .Marland Kitchen.. 1 By Mouth Qid 14)  Librax 2.5-5 Mg Caps (Clidinium-Chlordiazepoxide) .... Take 1 Tablet By Mouth Two Times A Day  Allergies (verified): 1)  ! Lisinopril (Lisinopril) 2)  ! Amoxicillin 3)  ! Keflex  Past History:  Past Medical History: Reviewed history from 10/19/2010 and no changes required. ALLERGIC RHINITIS (ICD-477.9) Hx of PULMONARY NODULE (ICD-518.89) HYPERTENSION (ICD-401.9) PERIPHERAL VASCULAR DISEASE (ICD-443.9) GERD (ICD-530.81) IRRITABLE BOWEL SYNDROME (ICD-564.1) LOW BACK PAIN SYNDROME (ICD-724.2) HEADACHE (ICD-784.0) ANXIETY (ICD-300.00) VITAMIN D DEFICIENCY (ICD-268.9)  Past Surgical History: Reviewed history  from 10/19/2010 and no changes required. S/P T & A as a child S/P bilateral tubal ligation 1984 S/P hysterectomy S/P PUD surg w/ duod stricture-plasty & vagotomy 2003 by DrToth  Family History: Reviewed history from 10/19/2010 and no changes required. 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  Social History: Reviewed history from 06/08/2009 and no changes required. Patient has never smoked.  exposed to second hand smoke---her husband smokes exercises 3 times per week caffeine use daily alcohol use rare married 1 son  Review of Systems       The patient complains of anxiety-new and headaches-new.  The patient denies allergy/sinus, anemia, arthritis/joint pain, back pain, blood in urine, breast changes/lumps, change in vision, confusion, cough, coughing up blood, depression-new, fainting, fatigue, fever, hearing problems, heart murmur, heart rhythm changes, itching, menstrual pain, muscle pains/cramps, night sweats, nosebleeds, pregnancy symptoms, shortness of breath, skin rash, sleeping problems, sore throat, swelling of feet/legs, swollen lymph glands, thirst - excessive , urination - excessive , urination changes/pain, urine leakage, vision changes, and voice change.         Pertinent positive and negative review of systems were noted in the above HPI. All other ROS was otherwise negative.   Vital Signs:  Patient profile:   60 year old female Height:      64 inches Weight:      131.25 pounds BMI:     22.61 Pulse rate:   68 / minute Pulse rhythm:   regular BP sitting:   128 / 84  (left arm) Cuff size:   regular  Vitals Entered By: June McMurray CMA Duncan Dull) (January 24, 2011 2:36 PM)  Physical Exam  General:  Well developed, well nourished, no acute distress. Mouth:  No deformity or lesions, dentition normal. Neck:  Supple; no masses or thyromegaly. Lungs:  Clear throughout to auscultation. Heart:  Regular rate and rhythm; no murmurs, rubs,  or bruits. Abdomen:  very tender epigastrium and to the left of the epigastrium and left costal margin. Bowel sounds are normoactive. There is no rebound. Right upper quadrant is unremarkable. There is no CVA tenderness. Lower abdomen is unremarkable. Rectal:  soft Hemoccult  negative stool. Extremities:  No clubbing, cyanosis, edema or deformities noted. Skin:  Intact without significant lesions or rashes. Psych:  Alert and cooperative. Normal mood and affect.   Impression & Recommendations:  Problem # 1:  PEPTIC ULCER DISEASE, CHRONIC (ICD-533.70) Patient has a history of peptic stricture and is status post truncal vagotomy and stricturoplasty in 2002 with recurrent abdominal pain. We need to rule out a partial gastric outlet obstruction or recurrent ulcer. There is a history of questionable pancreatitis post operatively. Pancreatitis needs to be ruled out. We will schedule an upper abdominal ultrasound as well as upper endoscopy. She is to remain on her pantoprazole and Carafate. She may continue on Librax and a low residue diet with frequent small feedings.  Other Orders: EGD (EGD) Ultrasound Abdomen (UAS) TLB-CBC Platelet - w/Differential (85025-CBCD) TLB-Amylase (82150-AMYL) TLB-Lipase (83690-LIPASE) TLB-Hepatic/Liver Function Pnl (80076-HEPATIC)  Patient Instructions: 1)  Your physician requests that you go to the basement floor of our office to have the following labwork completed before leaving today: CBC, amylase, lipase and hepatic function. 2)  You have been scheduled for an endoscopy. Please follow written prep instructions that were  given to you today at your visit.  3)  You have been scheduled for an abdominal ultrasound at [facility] 02/03/11 @ 8 am 4)  Continue pantoprazole 40 mg twice a day and Carafate 1 g by mouth 4 times a day. 5)  Continue Librax one tablet twice a day. 6)  Make sure to use small feedings. 7)  Copy sent to : Dr Kriste Basque 8)  The medication list was reviewed and reconciled.  All changed / newly prescribed medications were explained.  A complete medication list was provided to the patient / caregiver.

## 2011-02-03 NOTE — Procedures (Addendum)
Summary: Upper Endoscopy  Patient: [name] Note: All result statuses are Final unless otherwise noted.  Tests: (1) Upper Endoscopy (EGD)   EGD Upper Endoscopy       DONE (C)     [facility]     [address]     [place], [place]  [address]          ENDOSCOPY PROCEDURE REPORT          PATIENT:  Michaela Rios, Michaela Rios  MR#:  [id number]     BIRTHDATE:  05-28-1951, 59 yrs. old  GENDER:  female          ENDOSCOPIST:  Hedwig Morton. Juanda Chance, MD     Referred by:  Alroy Dust, M.D.          PROCEDURE DATE:  01/27/2011     PROCEDURE:  EGD with biopsy, [address]     ASA CLASS:  Class II     INDICATIONS:  abdominal pain hx PUD, s/p truncal vagotomy and     stricturoplasty 2002, hx of recurrent prepyloric ulcer 2004          MEDICATIONS:   Versed 7 mg, Fentanyl 75 mcg     TOPICAL ANESTHETIC:  Exactacain Spray          DESCRIPTION OF PROCEDURE:   After the risks benefits and     alternatives of the procedure were thoroughly explained, informed     consent was obtained.  The LB GIF-H180 [id number] endoscope was     introduced through the mouth and advanced to the second portion of     the duodenum, without limitations.  The instrument was slowly     withdrawn as the mucosa was fully examined.     <<PROCEDUREIMAGES>>          An ulcer was found. ulcerated pyloric chennel, no obstruction, s/p     remote pyloroplasty, fresh blood overlying the ulcerated area With     standard forceps, a biopsy was obtained and sent to pathology (see     image4 and image6).  Esophagitis was found in the total esophagus     (see image1, image2, and image7). candida esophagitis  Mild     gastritis was found (see image3). retained bile in the stomach     Retroflexed views revealed no abnormalities.    The scope was then     withdrawn from the patient and the procedure completed.          COMPLICATIONS:  None          ENDOSCOPIC IMPRESSION:     1) Ulcer     2) Esophagitis in the esophagus, suspect  Candida     3) Mild gastritis     recurrent PUD, s/p biopsies to r/o H.pylori, no evidence of     gastric outlet obstruction     RECOMMENDATIONS:     1) Await biopsy results     cont Pantoprazole 40 mg po bid     continue Librax bid     cont Carafate 1gm po bid     Diflucan 100mg , #3 , 1 po qd x3     soft diet     OV 4-6 weeks          REPEAT EXAM:  In 0 year(s) for.          ______________________________     Hedwig Morton. Juanda Chance, MD          CC:  n.     REVISED:  01/27/2011 05:13 PM     eSIGNED:   Hedwig Morton. Jearline Hirschhorn at 01/27/2011 05:13 PM          Leeson, Liborio Nixon, [phone]  Note: An exclamation mark (!) indicates a result that was not dispersed into the flowsheet. Document Creation Date: 01/27/2011 5:14 PM _______________________________________________________________________  (1) Order result status: Final Collection or observation date-time: 01/27/2011 16:54 Requested date-time:  Receipt date-time:  Reported date-time:  Referring Physician:   Ordering Physician: [name] [phone]) Specimen Source:  Source: Launa Grill Order Number: [id number] Lab site:

## 2011-02-04 ENCOUNTER — Encounter: Payer: Self-pay | Admitting: Internal Medicine

## 2011-02-08 NOTE — Letter (Signed)
Summary: Appt Reminder 2  [facility]  [address] [place], [place] [address]   Phone: [phone]  Fax: [phone]        January 31, 2011 MRN: [id number]    [facility] [address] [place], [place]  [address]    Dear Ms. Guerrier,   You have a return appointment with Dr. [name] on 03/02/2011 at 11:00 AM.  Please remember to bring a complete list of the medicines you are taking, your insurance card and your co-pay.  If you have to cancel or reschedule this appointment, please call before 5:00 pm the evening before to avoid a cancellation fee.  If you have any questions or concerns, please call [phone].    Sincerely,    Jesse Fall RN  Appended Document: Appt Reminder 2 Letter mailed to patient.

## 2011-02-08 NOTE — Letter (Signed)
Summary: Patient [facility] Biopsy Results  [facility]  [address] [place], [place] [address]   Phone: [phone]  Fax: [phone]        February 01, 2011 MRN: [id number]    [facility] [address] [place], [place]  [address]    Dear Ms. Michaela Rios,  I am pleased to inform you that the biopsies taken during your recent endoscopic examination did not show any evidence of cancer upon pathologic examination.The biopsies show inflammation in the stomach.  Additional information/recommendations:  __No further action is needed at this time.  Please follow-up with      your primary care physician for your other healthcare needs.  __ Please call [phone] to schedule a return visit to review      your condition.  _x_ Continue with the treatment plan as outlined on the day of your      exam.     Please call us if you are having persistent problems or have questions about your condition that have not been fully answered at this time.  Sincerely,  Hart Carwin MD  This letter has been electronically signed by your physician.  Appended Document: Patient Notice-Endo Biopsy Results letter mailed

## 2011-02-08 NOTE — Progress Notes (Signed)
  Phone Note Outgoing Call   Call placed by: Jesse Fall, RN Call placed to: Patient Summary of Call: Called to give patient her f/u appt. on 03/02/2011 at 11:00 AM with Dr. Juanda Chance. Message left for patient to call back.Jesse Fall, RN 01/28/11 9:02 AM  Follow-up for Phone Call        Letter mailed to patient re: OV. Follow-up by: Jesse Fall RN,  January 31, 2011 8:28 AM     Appended Document:  Patient returned our call. Gave her appointment date and time

## 2011-02-15 NOTE — Medication Information (Signed)
Summary: Approved Protonix/[facility]  Approved Protonix/[facility]   Imported By: Lester Sherman 02/10/2011 08:56:06  _____________________________________________________________________  External Attachment:    Type:   Image     Comment:   External Document

## 2011-03-02 ENCOUNTER — Ambulatory Visit: Payer: Managed Care, Other (non HMO) | Admitting: Internal Medicine

## 2011-03-08 ENCOUNTER — Ambulatory Visit (INDEPENDENT_AMBULATORY_CARE_PROVIDER_SITE_OTHER): Payer: Managed Care, Other (non HMO) | Admitting: Internal Medicine

## 2011-03-08 ENCOUNTER — Encounter: Payer: Self-pay | Admitting: Internal Medicine

## 2011-03-08 VITALS — BP 128/74 | HR 88 | Ht 64.0 in | Wt 135.0 lb

## 2011-03-08 DIAGNOSIS — K277 Chronic peptic ulcer, site unspecified, without hemorrhage or perforation: Secondary | ICD-10-CM

## 2011-03-08 DIAGNOSIS — K273 Acute peptic ulcer, site unspecified, without hemorrhage or perforation: Secondary | ICD-10-CM

## 2011-03-08 MED ORDER — PANTOPRAZOLE SODIUM 40 MG PO TBEC: DELAYED_RELEASE_TABLET | ORAL | Status: DC

## 2011-03-08 NOTE — Progress Notes (Signed)
Michaela Rios [birth year] MRN [id number]    History of Present Illness:  This is a 60 year old African American female with recurrent peptic ulcer disease. She underwent an upper endoscopy in March 2012 with findings of a pyloric channel ulcer,. She was H.Pylori negative. She had candida esophagitis and retained bile. She has improved somewhat on Carafate, Protonix 40 mg twice a day and Librax. Biopsies showed reactive gastropathy. She has a history of peptic ulcer disease and is status post truncal vagotomy and stricturoplasty in January 2002, as well as recurrent prepyloric ulcer in 2004. She had questionable pancreatitis in 2004. Her amylase was elevated to 223. Her upper abdominal ultrasound at that time was negative. Her last colonoscopy was in 1999. She still complains of epigastric pain.   Past Medical History  Diagnosis Date  . Pulmonary nodule   . Hypertension   . Peripheral vascular disease   . GERD (gastroesophageal reflux disease)   . IBS (irritable bowel syndrome)   . Low back pain syndrome   . Headache   . Anxiety   . Vitamin D deficiency   . Peptic ulcer disease   . Esophagitis    Past Surgical History  Procedure Date  . Tonsillectomy and adenoidectomy   . Tubal ligation   . Abdominal hysterectomy     reports that she has been passively smoking.  She does not have any smokeless tobacco history on file. She reports that she drinks alcohol. She reports that she does not use illicit drugs. family history includes Aneurysm in her father and Prostate cancer in her brother.  There is no history of Colon cancer. Allergies  Allergen Reactions  . Amoxicillin     REACTION: hives  . Cephalexin     REACTION: hives  . Lisinopril     REACTION: ? hx of ACE cough...        Review of Systems occasional dysphagia to liquids. No vomiting. Positive for abdominal pain. Normal bowel habits. No rectal bleeding.  The remainder of the 10  point ROS is negative except as outlined  in H&P   Physical Exam: General appearance  Well developed, in no distress. Eyes- non icteric. HEENT nontraumatic, normocephalic. Mouth no lesions, tongue papillated, no cheilosis. Neck supple without adenopathy, thyroid not enlarged, no carotid bruits, no JVD. Lungs Clear to auscultation bilaterally. Cor normal S1 normal S2, regular rhythm , no murmur,  quiet precordium. Abdomen soft tender in epigastrium and across the upper abdomen without rebound. Normal active bowel sounds. No distention. No tympany. Liver edge at costal margin. Well-healed surgical scar. Extremities no pedal edema. Skin no lesions. Neurological alert and oriented x 3. Psychological normal mood and affect.  Assessment and Plan:  Problems #1 chronic peptic ulcer disease. Patient has persistent epigastric pain but is much improved on PPI's, Carafate and antispasmodics. Patient is status post acid reducing surgery in 2002 with subsequent recurrence of the ulcers on 2 separate occasions. I recommend a four-week medical leave to avoid stress while she continue her medications,  so we can achieve a more complete recovery.   Problem #2 Colorectal screening. Patient's last colonoscopy was in 1999. She is due for a repeat colonoscopy this year. We will send a recall letter in the next few months.    03/08/2011 [name]

## 2011-03-08 NOTE — Patient Instructions (Signed)
We have written a work excuse for you x 4 weeks. You will be due for a recall colonoscopy in August 2012. We will send a reminder in the mail when it gets closer to that time. We have sent refills for Protonix to [facility] for you.

## 2011-03-09 ENCOUNTER — Telehealth: Payer: Self-pay | Admitting: Internal Medicine

## 2011-03-09 NOTE — Progress Notes (Signed)
Prescription printed and faxed to [facility] manually.

## 2011-03-09 NOTE — Telephone Encounter (Signed)
Spoke with patient and she is wanting to speak with medical records to discuss the release of information to short term disability. Patient given number to call for this.

## 2011-03-24 ENCOUNTER — Telehealth: Payer: Self-pay | Admitting: Internal Medicine

## 2011-03-24 NOTE — Telephone Encounter (Signed)
Patient is calling because her short term disability was declined. The insurance company told her they had faxed additional papers to be filled out. She called [employer] and they do not have them. She wants to see if we have them. I have not seen them and neither has Dottie. Patient notified.

## 2011-03-24 NOTE — Telephone Encounter (Signed)
Patient is calling because her short term disability was deci

## 2011-04-12 NOTE — Procedures (Signed)
CAROTID DUPLEX EXAM   INDICATION:  Patient experienced a popping feeling at the back of her  head approximately three weeks ago, followed by bilateral visual  disturbances, posterior headache, and presyncopal episode, lasting less  than one minute.  Patient complains of episodic left arm numbness with  position.   HISTORY:  Diabetes:  No.  Cardiac:  No.  Hypertension:  Yes.  Smoking:  No.  Previous Surgery:  No.  CV History:  Amaurosis Fugax No, Paresthesias No, Hemiparesis No  Father died of ruptured AAA.                                       RIGHT             LEFT  Brachial systolic pressure:         118               116  Brachial Doppler waveforms:         WNL               WNL  Vertebral direction of flow:        Antegrade         Antegrade  DUPLEX VELOCITIES (cm/sec)  CCA peak systolic                   100               89  ECA peak systolic                   115               62  ICA peak systolic                   89                68  ICA end diastolic                   32                35  PLAQUE MORPHOLOGY:                  Soft              Soft  PLAQUE AMOUNT:                      Mild              Mild  PLAQUE LOCATION:                    DCCA, bifurcation, PICA             DCCA, bifurcation, PICA   IMPRESSION:  1. Bilateral 1-39% internal carotid artery stenoses.  2. Patent external carotid and vertebral arteries bilaterally   ___________________________________________  Janetta Hora. Fields, MD   PB/MEDQ  D:  11/19/2007  T:  11/19/2007  Job:  [id number]

## 2011-04-15 ENCOUNTER — Encounter: Payer: Managed Care, Other (non HMO) | Admitting: Internal Medicine

## 2011-04-15 NOTE — Op Note (Signed)
[facility]  Patient:    Michaela Rios, Michaela Rios                    MRN: [id number] Proc. Date: 12/11/00 Adm. Date:  [id number] Attending:  Chevis Pretty S                           Operative Report  PREOPERATIVE DIAGNOSES:  Duodenal stricture and umbilical hernia.  POSTOPERATIVE DIAGNOSES:  Duodenal stricture and umbilical hernia.  PROCEDURE:  Duodenal stricturoplasty, truncal vagotomy, and umbilical hernia repair.  SURGEON:  Chevis Pretty, M.D.  ASSISTANT:  Sheppard Plumber. Earlene Plater, M.D.  ANESTHESIA:  General endotracheal.  DESCRIPTION OF PROCEDURE:  After informed consent was obtained, the patient was brought to the operating room and placed in the supine position on the operating table.  After the adequate induction of general anesthesia, the patients abdomen was prepped with Betadine and draped in the usual sterile manner.  An upper midline incision was made from the xiphoid to the umbilicus using a Timberlake knife.  This incision was carried down through the skin and subcutaneous tissue with the Bovie electrocautery until the linea alba at the anterior abdominal wall was encountered.  The linea alba was incised with the Bovie electrocautery along its desiccations.  The preperitoneal space was then probed bluntly with the hemostat until the peritoneum was identified and grasped between clamps.  Care was taken to make sure no viscera were between the clamps, and the peritoneum was opened with the Bovie electrocautery.  Once this was done, a finger was able to be inserted into the peritoneal cavity, and the rest of the incision was opened under direct vision.  A Thompson retractor was then deployed, and body wall retractors were used to retract the abdominal wall upward and outward.  A sweetheart retractor was used to retract the left lobe of the liver up and out of the way.  The diaphragmatic attachment of the left lobe of the liver was taken down by a combination  of blunt dissection with a right-angle clamp and sharp dissection with the Bovie electrocautery.  The stomach was readily identifiable.  The pylorus was palpated and was normal in appearance.  Several centimeters distal to the pylorus, there was an obvious narrowing of the duodenum.  There were some adhesions of the serosa of the gallbladder to the omental tissue coming from the stomach, and these were taken down by a combination again of blunt dissection with a right-angle clamp and sharp dissection with the Bovie electrocautery.  The liver and gallbladder were palpated and felt normal. Once this was complete, the duodenum was partially Kocherized so that the first and second portions were mobilized upward into the wound.  Two anchoring stitches were placed in the anterior wall of the stomach and then of the anterior mesenteric wall of the duodenum for retraction purposes.  The duodenum was then opened across the stricture for a distance of a couple of centimeters on either side.  The inside of the duodenum was inspected, and a finger could be inserted both proximally and distally, and the duodenum was wide open.  There were no masses within the duodenum identified.  The duodenum, which was opened longitudinally, was then closed transversely with many interrupted 2-0 silk sutures.  When this was complete, the stricturoplasty was palpated and was found to be widely patent.  Attention was then turned to the gastroesophageal junction area, and the peritoneal and serosal  coverings of the GE junction were again taken down by a combination of blunt dissection with the right-angle clamp and sharp dissection with the Bovie electrocautery.  Once the anterior surface of the GE junction was cleaned, using blunt finger dissection, the esophagus was circumferentially dissected free.  The banjo-string type feel to the anterior and posterior vagus nerve were identified, and clips were placed proximally  and distally on the nerve, and the nerve was divided between the two.  Each segment that was divided was sent to pathology for evaluation.  This was done on the anterior and the posterior nerves.  The lower esophagus was inspected again, and no further banjo-string type structures could be palpated.  Next the umbilical defect was palpated from within the abdomen, and this small defect was closed with a figure-of-eight 0 Prolene stitch.  The abdomen was then irrigated with copious amounts of saline.  Hemostasis was excellent.  The fascia of the abdominal wall was then closed with two running #1 PDS sutures, and the skin was closed with staples.  Sterile dressings were applied.  The patient tolerated the procedure well.  At the end of the case, all sponge, needle and instrument counts were correct.  The patient was awakened and taken to the recovery room in stable condition. DD:  12/11/00 TD:  12/12/00 Job: [id number]

## 2011-04-15 NOTE — Discharge Summary (Signed)
[facility]  Patient:    [name], [name]                    MRN: [id number] Adm. Date:  [id number] Disc. Date: [id number] Attending:  Caleen Essex                           Discharge Summary  HOSPITAL COURSE:  Briefly, Michaela Rios is a 60 year old black female who was referred to me for gastric outlet obstruction from chronic peptic ulcer disease.  On December 11, 2000, she was taken to the operating room where she underwent a duodenal stricturoplasty and truncal vagotomy.  She tolerated the procedure well.  Over the next several days, she was able to get her nasogastric tube out and begin advancing her diet slowly.  By the day of discharge, she was tolerating a soft mechanical diet.  Her pain was under good control and she was able to ambulate without difficulty.  She was ready for discharge home.  DISCHARGE MEDICATIONS:  She was to resume her home medications.  She was also given a prescription for Lortab elixir.  ACTIVITY LEVEL:  She is to do no heavy lifting for six to eight weeks.  DIET:  Her diet is a soft mechanical diet for one week and then advance to a regular diet.  WOUND CARE:  She is to keep her wound clean and may shower.  FOLLOW-UP:  In two weeks with Chevis Pretty, M.D.  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  CONDITION AT THE TIME OF DISCHARGE:  Stable.  DISPOSITION:  She is discharged to home. DD:  01/11/01 TD:  01/11/01 Job: [id number]

## 2011-04-15 NOTE — Procedures (Signed)
[facility]  Patient:    Michaela Rios, Michaela Rios                    MRN: [id number] Proc. Date: 08/30/00 Adm. Date:  [id number] Disc. Date: [id number] Attending:  Mervin Hack                           Procedure Report  PROCEDURE:  Upper endoscopy with a balloon dilatation.  INDICATIONS:  This 60 year old black female has a history of chronic duodenal outlet obstruction due to chronic peptic ulcer disease.  She has been recently having increasing pain in the left upper quadrant with some nausea.  We increased her Prevacid to 30 mg twice a day.  She has not taken any aspirin or NSAIDs.  The pain is worse postprandially and at night.  PHYSICAL EXAMINATION:  On physical exam on March 25, 2000, her stool was Hemoccult negative.  She is now undergoing upper endoscopy with balloon dilatation of the duodenal outlet obstruction.  Last exam was done approximately two years ago.  ENDOSCOPE:  Olympus single-channel video endoscope.  SEDATION:  Versed 10 mg IV, Demerol 100 mg IV.  FINDINGS:  Olympus single-channel video endoscope passed directly into the posterior pharynx and into the esophagus.  Oxygen saturations were normal. Proximal esophagus was normal with normal squamocolumnar junction.  Patient had spasm of the upper esophageal sphincter and initially esophagus could not be intubated.  Passing a 34 French Maloney dilator opened the upper esophageal sphincter so we were able to intubate the esophagus.  STOMACH:  Stomach was insufflated with air and showed normal-appearing gastric mucosa on the body and the gastric antrum which was normal, as well as normal pyloric outlet.  Retroflexion of endoscope showed normal pylorus and cardia. There was no food or excess fluid in the stomach.  DUODENUM:  Duodenal bulb showed outlet which had concentric narrowing of the orifice to where the endoscope could not traverse through.  The diameter of the endoscope was  about 9 mm.  The stricture was about 8-9 mm in diameter.  It was fibrous with some intense erythema and bleeding, as well as fibrous mucosa surrounding the orifice.  At this point, the 15 mm pyloric balloon measuring 5.5 cm in length passed through the endoscope and was positioned in the duodenal stricture under direct vision.  The balloon was insufflated to 60 pounds and held insufflated for about a minute.  After deflation, there was some bleeding from the orifice of the stricture.  Endoscope passed easily through the duodenal outlet this time.  Descending duodenum appeared normal. There was no significant bleeding, although there was some blood from the dilatation which reflexed into the gastric antrum.  IMPRESSION:  Duodenal outlet obstruction, status post dilatation to 15 mm.  PLAN: 1. Resume Prevacid 30 mg p.o. b.i.d. 2. Continue Carafate 1 g p.o. q.i.d. 3. Small frequent feedings. 4. Office visit to discuss possible surgical revision of the duodenal outlet    obstruction. DD:  08/30/00 TD:  08/30/00 Job: [id number]

## 2011-04-19 ENCOUNTER — Other Ambulatory Visit (INDEPENDENT_AMBULATORY_CARE_PROVIDER_SITE_OTHER): Payer: Managed Care, Other (non HMO)

## 2011-04-19 ENCOUNTER — Encounter: Payer: Self-pay | Admitting: Pulmonary Disease

## 2011-04-19 ENCOUNTER — Ambulatory Visit (INDEPENDENT_AMBULATORY_CARE_PROVIDER_SITE_OTHER): Payer: Managed Care, Other (non HMO) | Admitting: Pulmonary Disease

## 2011-04-19 DIAGNOSIS — K589 Irritable bowel syndrome without diarrhea: Secondary | ICD-10-CM

## 2011-04-19 DIAGNOSIS — I10 Essential (primary) hypertension: Secondary | ICD-10-CM

## 2011-04-19 DIAGNOSIS — F411 Generalized anxiety disorder: Secondary | ICD-10-CM

## 2011-04-19 DIAGNOSIS — K219 Gastro-esophageal reflux disease without esophagitis: Secondary | ICD-10-CM

## 2011-04-19 DIAGNOSIS — J309 Allergic rhinitis, unspecified: Secondary | ICD-10-CM

## 2011-04-19 DIAGNOSIS — J984 Other disorders of lung: Secondary | ICD-10-CM

## 2011-04-19 DIAGNOSIS — R51 Headache: Secondary | ICD-10-CM

## 2011-04-19 LAB — CBC WITH DIFFERENTIAL/PLATELET
Basophils Absolute: 0 10*3/uL (ref 0.0–0.1)
Eosinophils Absolute: 0.3 10*3/uL (ref 0.0–0.7)
Hemoglobin: 11.4 g/dL — ABNORMAL LOW (ref 12.0–15.0)
Lymphocytes Relative: 34.8 % (ref 12.0–46.0)
Lymphs Abs: 1.2 10*3/uL (ref 0.7–4.0)
MCHC: 34.2 g/dL (ref 30.0–36.0)
Monocytes Absolute: 0.4 10*3/uL (ref 0.1–1.0)
Neutro Abs: 1.6 10*3/uL (ref 1.4–7.7)
RDW: 14.8 % — ABNORMAL HIGH (ref 11.5–14.6)

## 2011-04-19 LAB — BASIC METABOLIC PANEL
CO2: 28 mEq/L (ref 19–32)
Calcium: 8.7 mg/dL (ref 8.4–10.5)
Chloride: 107 mEq/L (ref 96–112)
Sodium: 141 mEq/L (ref 135–145)

## 2011-04-19 LAB — HEPATIC FUNCTION PANEL
Alkaline Phosphatase: 61 U/L (ref 39–117)
Bilirubin, Direct: 0 mg/dL (ref 0.0–0.3)
Total Protein: 7 g/dL (ref 6.0–8.3)

## 2011-04-19 MED ORDER — FUROSEMIDE 20 MG PO TABS: 20.0000 mg | ORAL_TABLET | Freq: Every day | ORAL | Status: DC

## 2011-04-19 MED ORDER — HYDROCODONE-ACETAMINOPHEN 7.5-750 MG PO TABS: 1.0000 | ORAL_TABLET | Freq: Three times a day (TID) | ORAL | Status: DC | PRN

## 2011-04-19 MED ORDER — VERAPAMIL HCL ER 120 MG PO TBCR: 120.0000 mg | EXTENDED_RELEASE_TABLET | Freq: Every day | ORAL | Status: DC

## 2011-04-19 MED ORDER — CLONAZEPAM 0.5 MG PO TABS: ORAL_TABLET | ORAL | Status: DC

## 2011-04-19 NOTE — Patient Instructions (Signed)
Today we updated your med list in EPIC...    We added a new pill to help your BP (& it can help chronic headaches too) VERAPAMIL take one tab daily...    We also refilled the meds you requested...  Today we did your follow up blood work...    Please call the PHONE TREE in a few days for your results...    Dial [id number] & when prompted enter your patient number followed by the # symbol...    Your patient number is:  [id number]#  Call for any questions...  Let's plan a follow up visit in 6 months, sooner if needed for problems.Marland KitchenMarland Kitchen

## 2011-04-19 NOTE — Progress Notes (Signed)
Subjective:    Patient ID: Michaela Rios, female    DOB: 02-16-1951, 60 y.o.   MRN: [id number]  HPI 60 y/o BF here for a follow up visit... she has multiple medical problems as noted below...    ~  March 10, 2010:  she has mult somatic complaints- fatigue, ?uti, sinus drainage, dizziness she thinks is secondary to the Nasonex, etc... requesting refills & new epipen... we discussed f/u labs (all OK), and UA (prob UTI Rx'd).  ~  October 19, 2010:  she has mult somatic complaints and a generally pos ROS... she has GERD & IBS and notes abd discomfort w/ N & D, noctural cough, occas choking while eating> she needs f/u w/ GI= DrDBrodie & we will set this up & change her Librax to Bentyl to see if this works better for her...  BP is controlled on the Lasix;  she remains on the same HA regimen from [facility], & requests refill Klonopin for her nerves...   ~  Apr 19, 2011:  81mo ROV & she continues to have major difficulty in 2 areas> GI & Headaches;  GI eval & rx by DrDBrodie w/ recurrent abd pain> s/p EGD (DU & candida esoph) & sonar (essent neg) treated w/ Protonix, Carafate, Librax, Bentyl;  Headache managed by [facility] at [facility] but "they don't do pain meds" & we give her VicodinES 7.5mg  Tid prn, pt wondering about pain management & asked to take this up w/ Neurology... Due to her chronic daily headaches- her BP is sl elev at 140/90 & we discussed adding Verapamil SR 120mg  daily for both problems...          Problem List:  ALLERGIC RHINITIS (ICD-477.9) - uses OTC antihistamines as needed.... seen by DrLawrence & DrSJacobs for ENT in the past... also seen by DrESL in 2005 w/ pos reactions to shellfish, dust, trees, grass, weeds... she wants epipen to keep on hand.  Hx of PULMONARY NODULE (ICD-518.89) - prev CTChest in 2005, 2006, & 2007 w/ several small 4-78mm right middle and lower lobe nodules without change, felt to be benign... ~  CXR 7/10 showed Clear & NAD (mild tort Ao). ~  CXR  5/12:  Pt was requested to get f/u film today but she forgot to go to [facility]! We will offer ret for CXR or f/u film later.  HYPERTENSION (ICD-401.9) - hx ACE cough in past... Prev controlled on LASIX 20mg /d...  ~  2[facility]cho 12/08 at [facility] showed norm LVF, mild MR, no source for emboli etc... ~  11/11:  BP= 128/82 today> denies fatigue, visual changes, CP, palipit, dizziness, syncope, dyspnea, edema, etc... ~  5/12:  BP= 140/90 & she notes chr daily HA, stress, etc... We decided to add VERAPAMIL SR 120mg /d  PERIPHERAL VASCULAR DISEASE (ICD-443.9) - CTChest in 2007 also showed ectasia of asc thor Ao... rec to take ASA 81mg /d... ~  CDopplers 12/08 at [facility] showed mild soft paque in CCAs bilat and 0-39% bilat ICA stenoses...  GERD & DUOD ULCER - on PROTONIX 40mg Bid + CARAFATE 1gmQid...  ~  hx chronic PUD w/ gastric outlet obstruction in past... 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 subseq surgery in 2003 by DrToth w/ duod stricture-plasty & vagotomy. ~  AbdSonar 3/12 showed distended GB, no stones, no ductal dil etc... ~  EGD 3/12 by DrBrodie showed +DU, remote pyloroplasty, gastritis (neg HPylori),candida esoph> treated w/ Diflucan, then incr PPI & Carafate...  IRRITABLE BOWEL SYNDROME (  ICD-564.1) - on LIBRAX Qid vs BENTYL 20mg  Qid... ~  last colon in EMR= 11/99 & neg/ WNL... ~  11/11: she notes incr IBS symptoms & we decided to switch Librax to BENTYL 20mg  every 4-6H as needed. ~  2/12:  GI f/u DrBrodie> notes reviewed...  LOW BACK PAIN SYNDROME (ICD-724.2)  HEADACHE (ICD-784.0) - followed for yrs in the [facility] now)... currently taking: ZOLOFT 100mg - 2tabsQam,  TOPAMAX 200mg - 2tabdQhs,  IMITREX 100mg Prn,  FLEXERIL 10mg - 2 QhsPrn... she tells me that [facility] requires that Rx for narcotics be filled by the pt's primary physician- she is requesting VICODIN ES 7.5mg ... ~  [facility] notes reviewed...  ANXIETY (ICD-300.00) - on KLONOPIN  0.5mg - 1/2-1 tabTid Prn... she wants Rx for #270 for mail order... I explained to her that she needs ROV Q64mo for monitoring & to get these perscriptions refilled...  VITAMIN D DEFICIENCY (ICD-268.9) - now on Vit D OTC 1000 u daily... ~  labs 5/09 showed Vit D level = 7... started Vit D 50000 u weekly. ~  labs 1/10 showed Vit D level = 67... OK to switch to 1000 u OTC daily...  ANEMIA - on MVI w/ Fe + Calcium & Vit D supplements... ~  Labs 5/12 showed Hg= 11.4   Past Surgical History  Procedure Date  . Tonsillectomy and adenoidectomy     as a child  . Tubal ligation 1984  . Abdominal hysterectomy   . Pud surgery w/duod sticture-plasty and vagotomy 2003    Dr. Carolynne Edouard    Outpatient Encounter Prescriptions as of 04/19/2011  Medication Sig Dispense Refill  . calcium-vitamin D (OSCAL 500/200 D-3) 500-200 MG-UNIT per tablet Take 1 tablet by mouth twice daily       . clidinium-chlordiazePOXIDE (LIBRAX) 2.5-5 MG per capsule Take by mouth. Take 1 tablet by mouth twice daily       . clonazePAM (KLONOPIN) 0.5 MG tablet 0.5 mg. Take 1/2-1 tablet by mouth three times daily as needed for nerves   ==> refilled #270 for 53mo supply & one refill.    . cyclobenzaprine (FLEXERIL) 10 MG tablet 10 mg. Take 2 tablets by mouth once daily as needed       . dicyclomine (BENTYL) 20 MG tablet Take 20 mg by mouth every 6 (six) hours. Take as needed for abdominal cramping       . EPINEPHrine (EPIPEN 2-PAK) 0.3 mg/0.3 mL DEVI Inject 0.3 mg into the muscle once.        . furosemide (LASIX) 20 MG tablet Take 20 mg by mouth daily.        Marland Kitchen HYDROcodone-acetaminophen (VICODIN ES) 7.5-750 MG per tablet Take 1 tablet by mouth every 8 (eight) hours as needed.    ==> refilled #90 for 30d supply & 5 refills.    . meclizine (ANTIVERT) 25 MG tablet 25 mg. Take 1 tablet by mouth every 6 hours as needed for dizziness       . pantoprazole (PROTONIX) 40 MG tablet Take 1 tablet by mouth twice daily  180 tablet  2  . sertraline  (ZOLOFT) 100 MG tablet 100 mg. Take 2 tablets by mouth once daily       . SUMAtriptan (IMITREX) 100 MG tablet Take 100 mg by mouth every 2 (two) hours as needed.        . topiramate (TOPAMAX) 200 MG tablet Take 200 mg by mouth 2 (two) times daily.        . sucralfate (CARAFATE)  1 G tablet Take 1 g by mouth 4 (four) times daily.          Allergies  Allergen Reactions  . Amoxicillin     REACTION: hives  . Cephalexin     REACTION: hives  . Lisinopril     REACTION: ? hx of ACE cough...    Review of Systems         See HPI - all other systems neg except as noted... The patient complains of dyspnea on exertion, abdominal pain, severe indigestion/heartburn, and muscle weakness.  The patient denies anorexia, fever, weight loss, weight gain, vision loss, decreased hearing, hoarseness, chest pain, syncope, peripheral edema, prolonged cough, headaches, hemoptysis, melena, hematochezia, hematuria, incontinence, suspicious skin lesions, transient blindness, difficulty walking, depression, unusual weight change, abnormal bleeding, enlarged lymph nodes, and angioedema.    Objective:   Physical Exam     WD, WN, 59 y/o BF in NAD... weight = 137# VITAL SIGNS:  Reviewed w/ BP= 140/90 GENERAL:  Alert & oriented; pleasant & cooperative... HEENT:  [place]/AT, EOM-wnl, PERRLA, EACs-clear, TMs-wnl, NOSE-clear, THROAT-clear & wnl, sl sinus tender on palp. NECK:  Supple w/ fairROM; no JVD; normal carotid impulses w/o bruits; no thyromegaly or nodules palpated; no lymphadenopathy. CHEST:  Clear to P & A; without wheezes/ rales/ or rhonchi heard... HEART:  Regular Rhythm; without murmurs/ rubs/ or gallops. ABDOMEN:  Soft & nontender; normal bowel sounds; no organomegaly or masses detected. EXT: without deformities or arthritic changes; no varicose veins/ venous insuffic/ or edema. NEURO:  CN's intact;  no focal neuro deficits... DERM:  No lesions noted; no rash etc...   Assessment & Plan:   Hx Pulm Nodules>   Due for f/u CXR but she forgot to go to [facility]; we will contact pt w/ option to return now or repeat in 43mo...  HBP>  We decided to add VERAPAMIL 120mg  daily for her BP & headaches...  GI>  Followed & managed by DrDBrodie w/ recent w/u reviewed w/ pt (DU & Gastritis)> on Protonix Bid, Carafate Qid, & Librax vs Bentyl for her IBS...  HEADACHES>  Followed & managed by DrHagen in the [facility]; pt inquired about Pain Management & she is asked to take this up w/ DrHagen; we refilled Vicodin #90 as noted.  ANXIETY>  On Klonopin & this was refilled per request...  ANEMIA & Vit D defic>  She continues on supplements.Marland KitchenMarland Kitchen

## 2011-04-20 ENCOUNTER — Telehealth: Payer: Self-pay | Admitting: Pulmonary Disease

## 2011-04-20 ENCOUNTER — Encounter: Payer: Self-pay | Admitting: Pulmonary Disease

## 2011-04-20 NOTE — Telephone Encounter (Signed)
SN is aware---pt will just need to come in for it when she can.

## 2011-04-20 NOTE — Telephone Encounter (Signed)
Michaela Rios, pt states she is returning your call.  Pls advise.  Thanks!

## 2011-04-20 NOTE — Telephone Encounter (Signed)
Called and spoke with pt about her lab results per SN---labs were all ok but mild anemia at 11.4  recs to take one a day mvi with iron, calcium and vit d 1000u daily.   She will return today for her cxr and call tomorrow for her results.

## 2011-04-20 NOTE — Telephone Encounter (Signed)
Will forward to SN 

## 2011-05-02 ENCOUNTER — Other Ambulatory Visit: Payer: Self-pay | Admitting: *Deleted

## 2011-05-02 MED ORDER — SUCRALFATE 1 G PO TABS: 1.0000 g | ORAL_TABLET | Freq: Four times a day (QID) | ORAL | Status: DC

## 2011-05-02 NOTE — Telephone Encounter (Signed)
Per Dr Juanda Chance, ok for patient to get carafate 3 month supply due to gastric ulcer. Rx sent.

## 2011-05-07 ENCOUNTER — Other Ambulatory Visit: Payer: Self-pay | Admitting: Internal Medicine

## 2011-05-10 ENCOUNTER — Telehealth: Payer: Self-pay | Admitting: Internal Medicine

## 2011-05-10 NOTE — Telephone Encounter (Signed)
Spoke with patient. She gets rx from 3 month mail in pharmacy. She does not need rx from local pharmacy.

## 2011-05-10 NOTE — Telephone Encounter (Signed)
Spoke with patient .She does not need 1 month supply of Pantoprazole and is unsure why we are getting a request from local pharmacy. She is getting prescription from mail in pharmacy.

## 2011-05-12 NOTE — Progress Notes (Signed)
This encounter was created in error - please disregard.

## 2011-05-16 ENCOUNTER — Ambulatory Visit: Payer: Managed Care, Other (non HMO) | Admitting: Internal Medicine

## 2011-07-29 ENCOUNTER — Telehealth: Payer: Self-pay | Admitting: Pulmonary Disease

## 2011-07-29 MED ORDER — CYCLOBENZAPRINE HCL 10 MG PO TABS: ORAL_TABLET | ORAL | Status: DC

## 2011-07-29 NOTE — Telephone Encounter (Signed)
LMOMTCB x 1 

## 2011-07-29 NOTE — Telephone Encounter (Signed)
Pt returning call can be reached at [phone].Michaela Rios

## 2011-07-29 NOTE — Telephone Encounter (Signed)
Spoke with pt to verify msg. She is requesting rx for flexeril. This was sent to pharm and pt states nothing further needed.

## 2011-08-19 ENCOUNTER — Other Ambulatory Visit: Payer: Self-pay | Admitting: Obstetrics & Gynecology

## 2011-08-19 DIAGNOSIS — R928 Other abnormal and inconclusive findings on diagnostic imaging of breast: Secondary | ICD-10-CM

## 2011-08-30 ENCOUNTER — Other Ambulatory Visit: Payer: Managed Care, Other (non HMO)

## 2011-09-05 ENCOUNTER — Other Ambulatory Visit: Payer: Managed Care, Other (non HMO)

## 2011-09-05 ENCOUNTER — Telehealth: Payer: Self-pay | Admitting: Pulmonary Disease

## 2011-09-05 NOTE — Telephone Encounter (Signed)
Pt return call to triage Michaela Rios

## 2011-09-05 NOTE — Telephone Encounter (Signed)
lmomtcb  

## 2011-09-05 NOTE — Telephone Encounter (Signed)
C/o right eye discharge starting yesterday AM with thick creamy discharge, itching, red, swelling, and closed shut when waking. Also corner of left eye red, discharge this AM. Dry hacking cough starting last week, occ chest tightness x 1 week, headache starting Saturday. Denies fever, n/v/d, body aches, chills, and sweats. I advised pt of frequent handwashing, no rubbing or scratching eyes, keep hands off of face, and to use a new washcloth when needed, and cold compresses. SN is out of office and will forward to TP and pt is okay with this. Please advise. Thanks.  Allergies  Allergen Reactions  . Amoxicillin     REACTION: hives  . Cephalexin     REACTION: hives  . Lisinopril     REACTION: ? hx of ACE cough...   [facility]

## 2011-09-06 NOTE — Telephone Encounter (Signed)
Spoke with pt and offered ov. She states that she has improved and feels ov not longer needed. She states that she has picked up rx for eye gtts and this really has helped. Pt states nothing further needed. I advised that if symptoms return, call for eval and she verbalized understanding.

## 2011-09-06 NOTE — Telephone Encounter (Signed)
Will be glad to see today ,  Ov to eval.

## 2011-09-15 ENCOUNTER — Ambulatory Visit
Admission: RE | Admit: 2011-09-15 | Discharge: 2011-09-15 | Disposition: A | Discharge status: Home / Self Care | Payer: Managed Care, Other (non HMO) | Source: Ambulatory Visit | Attending: Obstetrics & Gynecology | Admitting: Obstetrics & Gynecology

## 2011-09-15 DIAGNOSIS — R928 Other abnormal and inconclusive findings on diagnostic imaging of breast: Secondary | ICD-10-CM

## 2011-09-26 ENCOUNTER — Encounter: Payer: Self-pay | Admitting: Adult Health

## 2011-09-26 ENCOUNTER — Ambulatory Visit (INDEPENDENT_AMBULATORY_CARE_PROVIDER_SITE_OTHER)
Admission: RE | Admit: 2011-09-26 | Discharge: 2011-09-26 | Disposition: A | Discharge status: Home / Self Care | Payer: Managed Care, Other (non HMO) | Source: Ambulatory Visit | Attending: Pulmonary Disease | Admitting: Pulmonary Disease

## 2011-09-26 ENCOUNTER — Ambulatory Visit (INDEPENDENT_AMBULATORY_CARE_PROVIDER_SITE_OTHER): Payer: Managed Care, Other (non HMO) | Admitting: Adult Health

## 2011-09-26 VITALS — BP 120/96 | HR 93 | Temp 97.2°F | Ht 63.0 in | Wt 143.6 lb

## 2011-09-26 DIAGNOSIS — J40 Bronchitis, not specified as acute or chronic: Secondary | ICD-10-CM

## 2011-09-26 DIAGNOSIS — J984 Other disorders of lung: Secondary | ICD-10-CM

## 2011-09-26 IMAGING — CR DG CHEST 2V
2 series · 2 of 2 positions shown · non-contrast
Comparison: Chest radiographs [DATE] and CT [DATE].

CLINICAL DATA: Nonsmoker with cough for 1 month.  Hypertension.
Pulmonary nodule.

CHEST - 2 VIEW

[view not recorded (1 of 2)]
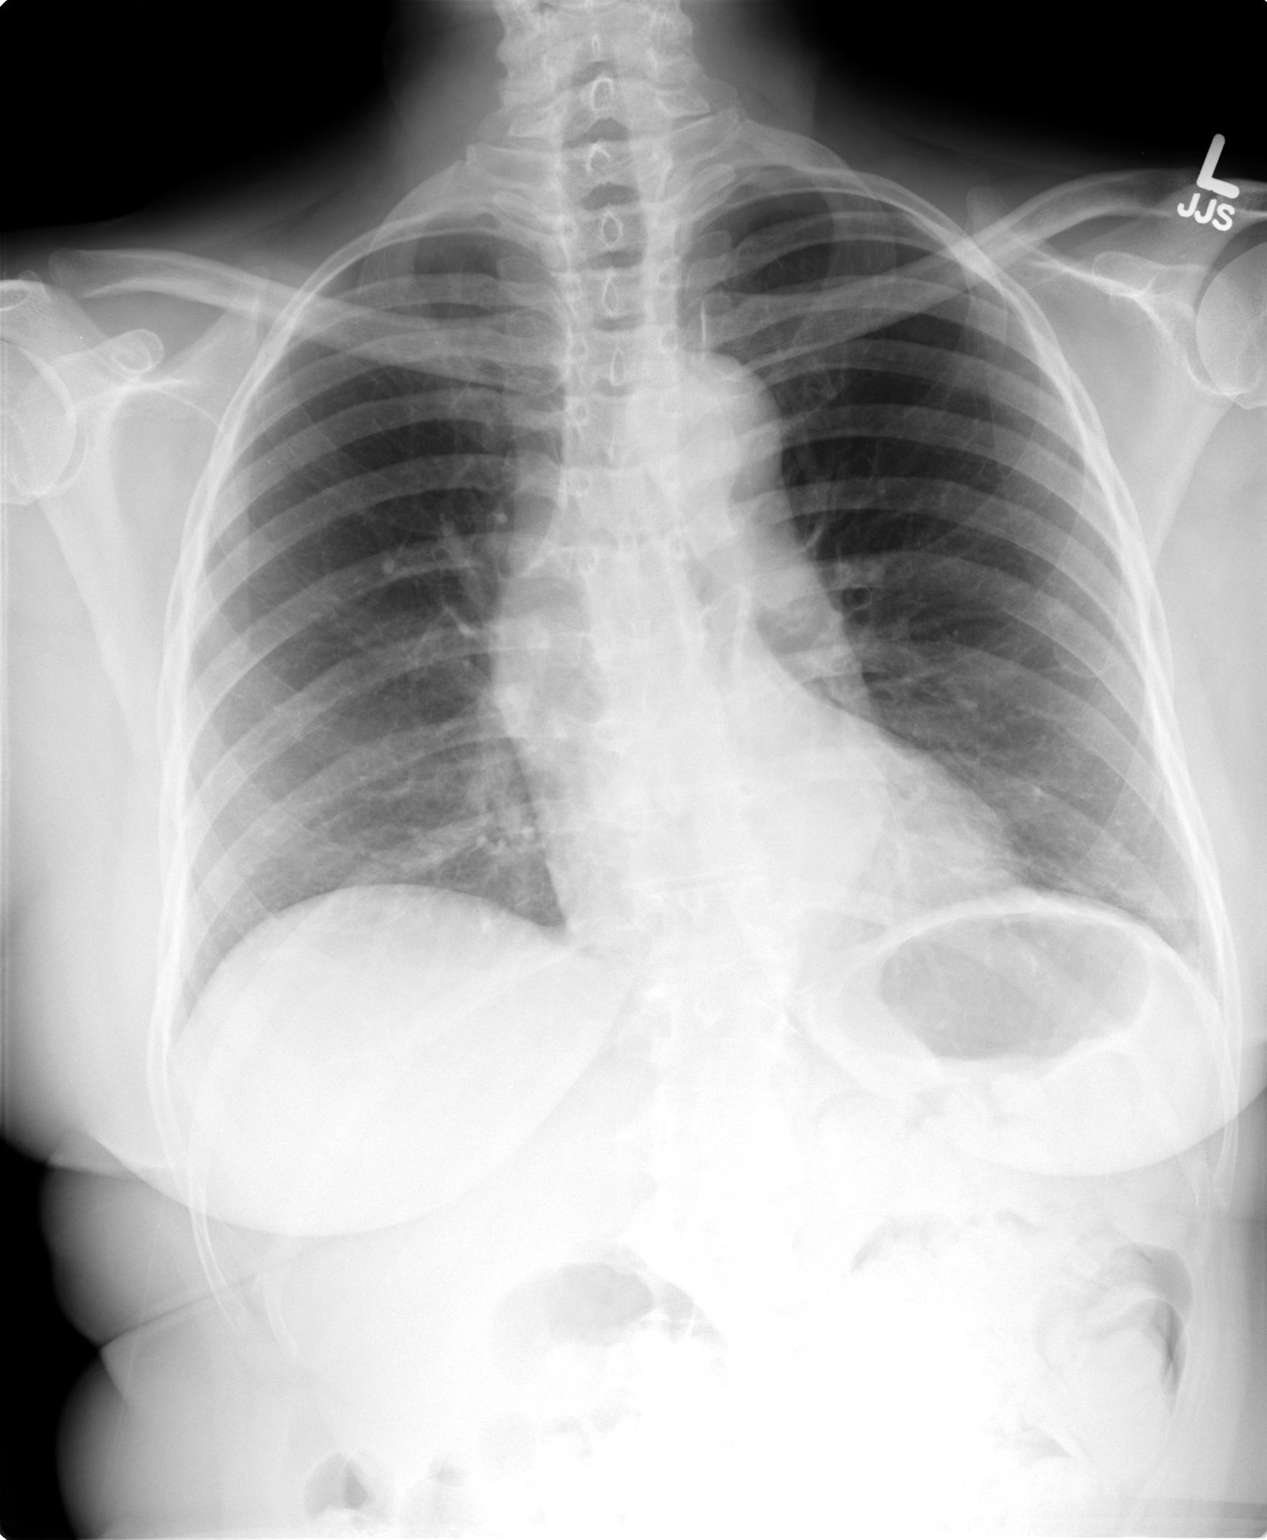

[view not recorded (2 of 2)]
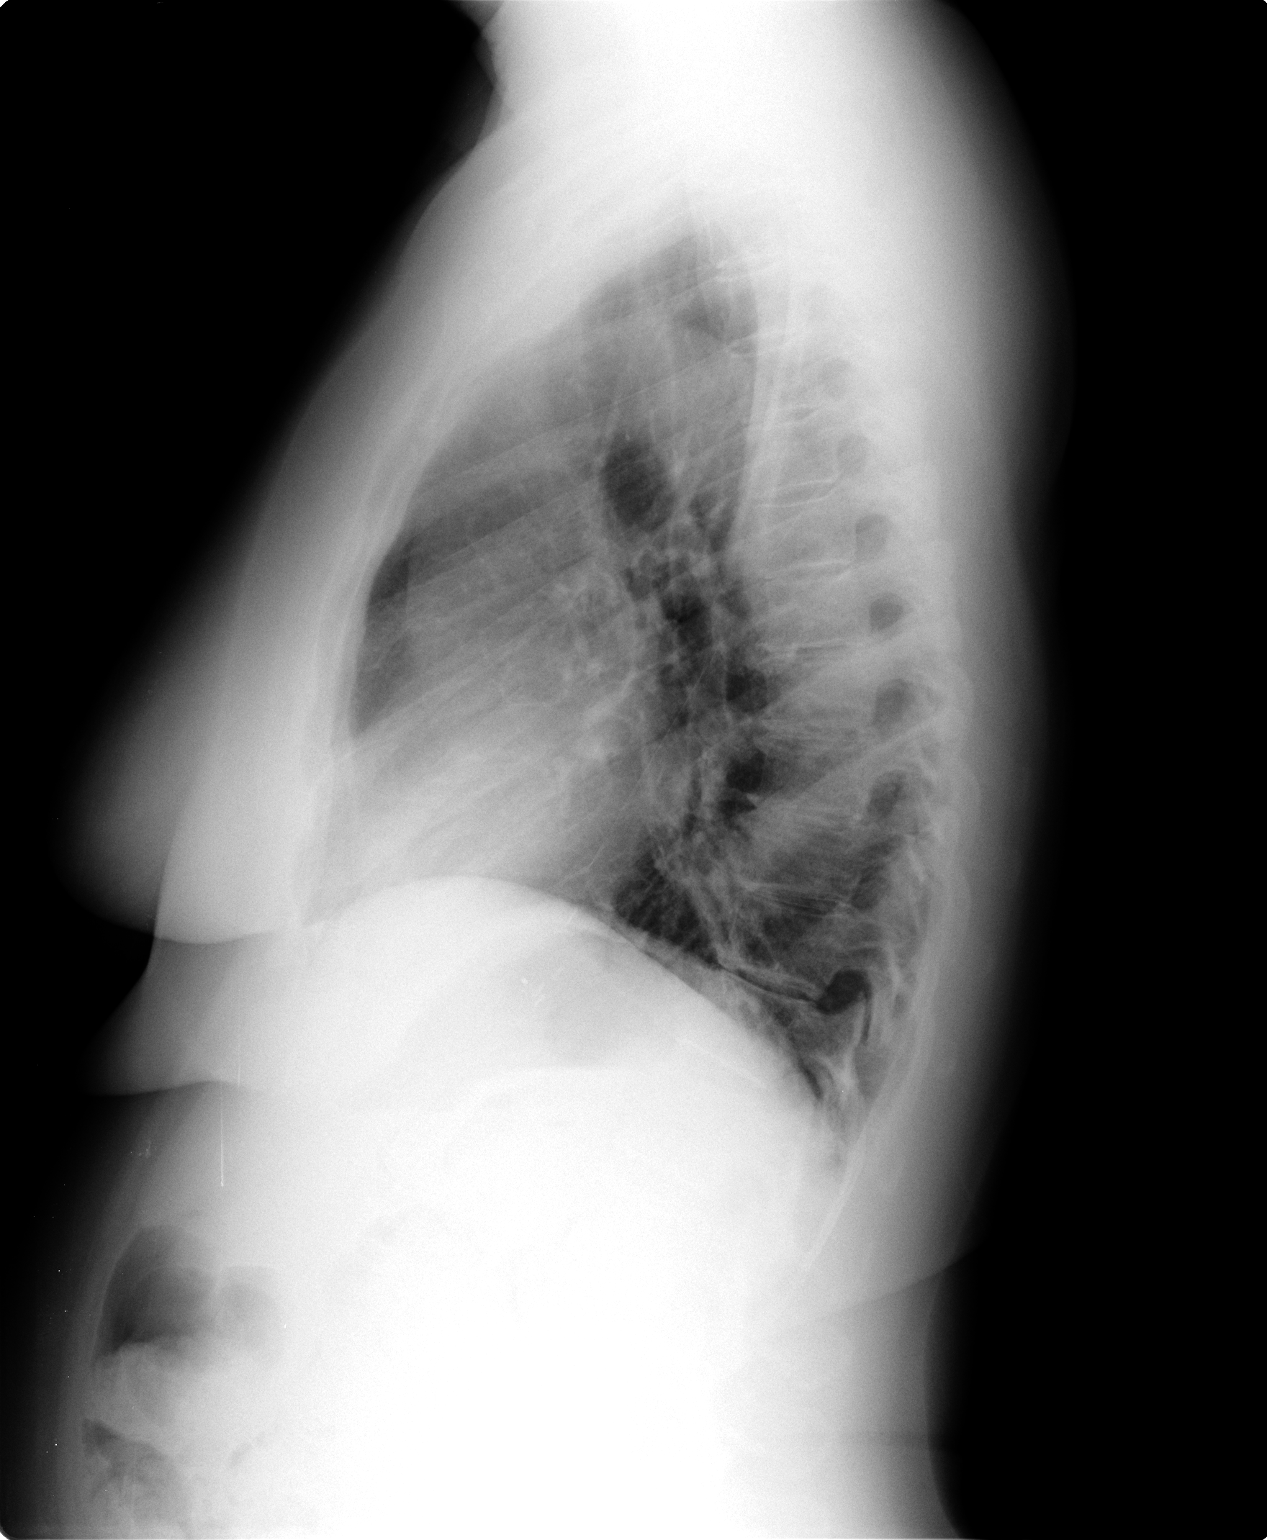

[2 of 2 positions shown; findings below may reference images not displayed]

FINDINGS: The heart size and mediastinal contours are stable with
aortic tortuosity.  The lungs appear clear.  No pulmonary nodules
are identified.  There is no pleural effusion.  Osseous structures
appear stable with mild thoracolumbar scoliosis.
IMPRESSION: Stable examination.  No acute cardiopulmonary process or pulmonary
nodule demonstrated.

## 2011-09-26 MED ORDER — HYDROCODONE-HOMATROPINE 5-1.5 MG/5ML PO SYRP: 5.0000 mL | ORAL_SOLUTION | Freq: Four times a day (QID) | ORAL | Status: AC | PRN

## 2011-09-26 MED ORDER — LEVOFLOXACIN 500 MG PO TABS: 500.0000 mg | ORAL_TABLET | Freq: Every day | ORAL | Status: AC

## 2011-09-26 NOTE — Patient Instructions (Signed)
Levaquin 500mg  daily for 7 days  Mucinex DM Twice daily  As needed  Cough/congestion  Saline nasal rinses As needed   I will call with xray results.  Hydromet 1-2 tsp every 4-6 hr As needed  Cough.  Zyrtec 10mg  At bedtime  As needed  Drainage.  Please contact office for sooner follow up if symptoms do not improve or worsen or seek emergency care

## 2011-09-26 NOTE — Progress Notes (Signed)
Subjective:    Patient ID: Michaela Rios, female    DOB: [birth year], 60 y.o.   MRN: [id number]  HPI  60 y/o BF here for a follow up visit... she has multiple medical problems as noted below...    ~  March 10, 2010:  she has mult somatic complaints- fatigue, ?uti, sinus drainage, dizziness she thinks is secondary to the Nasonex, etc... requesting refills & new epipen... we discussed f/u labs (all OK), and UA (prob UTI Rx'd).  ~  October 19, 2010:  she has mult somatic complaints and a generally pos ROS... she has GERD & IBS and notes abd discomfort w/ N & D, noctural cough, occas choking while eating> she needs f/u w/ GI= DrDBrodie & we will set this up & change her Librax to Bentyl to see if this works better for her...  BP is controlled on the Lasix;  she remains on the same HA regimen from [facility], & requests refill Klonopin for her nerves...   ~  Apr 19, 2011:  5mo ROV & she continues to have major difficulty in 2 areas> GI & Headaches;  GI eval & rx by DrDBrodie w/ recurrent abd pain> s/p EGD (DU & candida esoph) & sonar (essent neg) treated w/ Protonix, Carafate, Librax, Bentyl;  Headache managed by [facility]agen at [facility] but "they don't do pain meds" & we give her VicodinES 7.5mg  Tid prn, pt wondering about pain management & asked to take this up w/ Neurology... Due to her chronic daily headaches- her BP is sl elev at 140/90 & we discussed adding Verapamil SR 120mg  daily for both problems...   ~09/26/2011 Acute OV  Complains of  terrible dry cough, increase SOB, nausea, runny nose, drainage down the back of throat x 1 month. Has taken OTC without much help. Cough waxes and wanes. Was seen by urgent care, dx URI and conjunctivitis. Never got entirely over symptoms. Cough is getting worse.  Cough is keeping her up at night. Would like to get a prescription for cough syrup if possible today. She denies any chest pain, hemoptysis, abdominal pain or edema.          Problem  List:  ALLERGIC RHINITIS (ICD-477.9) - uses OTC antihistamines as needed.... seen by DrLawrence & DrSJacobs for ENT in the past... also seen by DrESL in 2005 w/ pos reactions to shellfish, dust, trees, grass, weeds... she wants epipen to keep on hand.  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 ~  CXR 7/10 showed Clear & NAD (mild tort Ao). ~  CXR 5/12:  Pt was requested to get f/u film today but she forgot to go to [facility] Dept! We will offer ret for CXR or f/u film later.  HYPERTENSION (ICD-401.9) - hx ACE cough in past... Prev controlled on LASIX 20mg /d...  ~  [facility]cho 12/08 at [facility] showed norm LVF, mild MR, no source for emboli etc... ~  11/11:  BP= 128/82 today> denies fatigue, visual changes, CP, palipit, dizziness, syncope, dyspnea, edema, etc... ~  5/12:  BP= 140/90 & she notes chr daily HA, stress, etc... We decided to add VERAPAMIL SR 120mg /d  PERIPHERAL VASCULAR DISEASE (ICD-443.9) - CTChest in 2007 also showed ectasia of asc thor Ao... rec to take ASA 81mg /d... ~  CDopplers 12/08 at [facility] showed mild soft paque in CCAs bilat and 0-39% bilat ICA stenoses.Marland KitchenMarland Kitchen  GERD & DUOD ULCER - on PROTONIX 40mg Bid + CARAFATE 1gmQid...  ~  hx chronic PUD w/ gastric outlet obstruction in past... 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 subseq surgery in 2003 by DrToth w/ duod stricture-plasty & vagotomy. ~  AbdSonar 3/12 showed distended GB, no stones, no ductal dil etc... ~  EGD 3/12 by DrBrodie showed +DU, remote pyloroplasty, gastritis (neg HPylori),candida esoph> treated w/ Diflucan, then incr PPI & Carafate...  IRRITABLE BOWEL SYNDROME (ICD-564.1) - on LIBRAX Qid vs BENTYL 20mg  Qid... ~  last colon in EMR= 11/99 & neg/ WNL... ~  11/11: she notes incr IBS symptoms & we decided to switch Librax to BENTYL 20mg  every 4-6H as needed. ~  2/12:   GI f/u DrBrodie> notes reviewed...  LOW BACK PAIN SYNDROME (ICD-724.2)  HEADACHE (ICD-784.0) - followed for yrs in the [facility] now)... currently taking: ZOLOFT 100mg - 2tabsQam,  TOPAMAX 200mg - 2tabdQhs,  IMITREX 100mg Prn,  FLEXERIL 10mg - 2 QhsPrn... [facility] me [facility] requires that Rx for narcotics be filled by the pt's primary physician- she is requesting VICODIN ES 7.5mg ... ~  [facility] notes reviewed...  ANXIETY (ICD-300.00) - on KLONOPIN 0.5mg - 1/2-1 tabTid Prn... she wants Rx for #270 for mail order... I explained to her that she needs ROV Q572mo for monitoring & to get these perscriptions refilled...  VITAMIN D DEFICIENCY (ICD-268.9) - now on Vit D OTC 1000 u daily... ~  labs 5/09 showed Vit D level = 7... started Vit D 50000 u weekly. ~  labs 1/10 showed Vit D level = 67... OK to switch to 1000 u OTC daily...  ANEMIA - on MVI w/ Fe + Calcium & Vit D supplements... ~  Labs 5/12 showed Hg= 11.4   Past Surgical History  Procedure Date  . Tonsillectomy and adenoidectomy     as a child  . Tubal ligation 1984  . Abdominal hysterectomy   . Pud surgery w/duod sticture-plasty and vagotomy 2003    Dr. Carolynne Edouard    Outpatient Encounter Prescriptions as of 04/19/2011  Medication Sig Dispense Refill  . calcium-vitamin D (OSCAL 500/200 D-3) 500-200 MG-UNIT per tablet Take 1 tablet by mouth twice daily       . clidinium-chlordiazePOXIDE (LIBRAX) 2.5-5 MG per capsule Take by mouth. Take 1 tablet by mouth twice daily       . clonazePAM (KLONOPIN) 0.5 MG tablet 0.5 mg. Take 1/2-1 tablet by mouth three times daily as needed for nerves   ==> refilled #270 for 72mo supply & one refill.    . cyclobenzaprine (FLEXERIL) 10 MG tablet 10 mg. Take 2 tablets by mouth once daily as needed       . dicyclomine (BENTYL) 20 MG tablet Take 20 mg by mouth every 6 (six) hours. Take as needed for abdominal cramping       . EPINEPHrine (EPIPEN 2-PAK) 0.3 mg/0.3 mL DEVI Inject 0.3 mg into the  muscle once.        . furosemide (LASIX) 20 MG tablet Take 20 mg by mouth daily.        Marland Kitchen HYDROcodone-acetaminophen (VICODIN ES) 7.5-750 MG per tablet Take 1 tablet by mouth every 8 (eight) hours as needed.    ==> refilled #90 for 30d supply & 5 refills.    . meclizine (ANTIVERT) 25 MG tablet 25 mg. Take 1 tablet by mouth every 6 hours as needed for dizziness       . pantoprazole (PROTONIX) 40 MG  tablet Take 1 tablet by mouth twice daily  180 tablet  2  . sertraline (ZOLOFT) 100 MG tablet 100 mg. Take 2 tablets by mouth once daily       . SUMAtriptan (IMITREX) 100 MG tablet Take 100 mg by mouth every 2 (two) hours as needed.        . topiramate (TOPAMAX) 200 MG tablet Take 200 mg by mouth 2 (two) times daily.        . sucralfate (CARAFATE) 1 G tablet Take 1 g by mouth 4 (four) times daily.          Allergies  Allergen Reactions  . Amoxicillin     REACTION: hives  . Cephalexin     REACTION: hives  . Lisinopril     REACTION: ? hx of ACE cough...    Review of Systems          Constitutional:   No  weight loss, night sweats,  Fevers, chills, fatigue, or  lassitude.  HEENT:   No headaches,  Difficulty swallowing,  Tooth/dental problems, or  Sore throat,                No sneezing, itching, ear ache,  ++nasal congestion, post nasal drip,   CV:  No chest pain,  Orthopnea, PND, swelling in lower extremities, anasarca, dizziness, palpitations, syncope.   GI  No heartburn, indigestion, abdominal pain, nausea, vomiting, diarrhea, change in bowel habits, loss of appetite, bloody stools.   Resp: No shortness of breath with exertion or at rest.   No coughing up of blood.    No chest wall deformity  Skin: no rash or lesions.  GU: no dysuria, change in color of urine, no urgency or frequency.  No flank pain, no hematuria   MS:  No joint pain or swelling.  No decreased range of motion.  No back pain.  Psych:  No change in mood or affect. No depression or anxiety.  No memory  loss.       Objective:   Physical Exam      WD, WN, 59 y/o BF in NAD...   GENERAL:  Alert & oriented; pleasant & cooperative... HEENT:  [place]/AT, EOM-wnl, PERRLA, EACs-clear, TMs-wnl, NOSE-clear drainage, THROAT-clear & wnl, sl sinus tender on palp. NECK:  Supple w/ fairROM; no JVD; normal carotid impulses w/o bruits; no thyromegaly or nodules palpated; no lymphadenopathy. CHEST:  Coarse BS ; without wheezes/ rales/ or rhonchi heard... HEART:  Regular Rhythm; without murmurs/ rubs/ or gallops. ABDOMEN:  Soft & nontender; normal bowel sounds; no organomegaly or masses detected. EXT: without deformities or arthritic changes; no varicose veins/ venous insuffic/ or edema. DERM:  No lesions noted; no rash etc...   Assessment & Plan:

## 2011-09-26 NOTE — Assessment & Plan Note (Signed)
Slow to resolve flare   Plan:  Levaquin 500mg  daily for 7 days  Mucinex DM Twice daily  As needed  Cough/congestion  Saline nasal rinses As needed   I will call with xray results.  Hydromet 1-2 tsp every 4-6 hr As needed  Cough.  Zyrtec 10mg  At bedtime  As needed  Drainage.  Please contact office for sooner follow up if symptoms do not improve or worsen or seek emergency care

## 2011-09-28 ENCOUNTER — Telehealth: Payer: Self-pay | Admitting: Pulmonary Disease

## 2011-09-28 NOTE — Telephone Encounter (Signed)
Called and spoke with pt and she is aware per SN that her BMD is normal.  Pt is aware to cont the mvi daily, vit d supplement daily, and calcium daily. Pt voiced her understanding of this.

## 2011-09-28 NOTE — Telephone Encounter (Signed)
Leigh, did you call this pt? I see where she had cxr done, but no result notes yet. Please advise, thanks!

## 2011-09-28 NOTE — Telephone Encounter (Signed)
Patient returning call.  [phone]

## 2011-10-10 ENCOUNTER — Encounter: Payer: Self-pay | Admitting: Pulmonary Disease

## 2011-10-19 ENCOUNTER — Ambulatory Visit: Payer: Managed Care, Other (non HMO) | Admitting: Pulmonary Disease

## 2011-10-25 ENCOUNTER — Ambulatory Visit (INDEPENDENT_AMBULATORY_CARE_PROVIDER_SITE_OTHER): Payer: Managed Care, Other (non HMO) | Admitting: Pulmonary Disease

## 2011-10-25 ENCOUNTER — Encounter: Payer: Self-pay | Admitting: Pulmonary Disease

## 2011-10-25 DIAGNOSIS — K277 Chronic peptic ulcer, site unspecified, without hemorrhage or perforation: Secondary | ICD-10-CM

## 2011-10-25 DIAGNOSIS — R51 Headache: Secondary | ICD-10-CM

## 2011-10-25 DIAGNOSIS — K219 Gastro-esophageal reflux disease without esophagitis: Secondary | ICD-10-CM

## 2011-10-25 DIAGNOSIS — K589 Irritable bowel syndrome without diarrhea: Secondary | ICD-10-CM

## 2011-10-25 DIAGNOSIS — F411 Generalized anxiety disorder: Secondary | ICD-10-CM

## 2011-10-25 DIAGNOSIS — J309 Allergic rhinitis, unspecified: Secondary | ICD-10-CM

## 2011-10-25 DIAGNOSIS — I739 Peripheral vascular disease, unspecified: Secondary | ICD-10-CM

## 2011-10-25 DIAGNOSIS — I10 Essential (primary) hypertension: Secondary | ICD-10-CM

## 2011-10-25 DIAGNOSIS — F329 Major depressive disorder, single episode, unspecified: Secondary | ICD-10-CM

## 2011-10-25 MED ORDER — CLONAZEPAM 0.5 MG PO TABS: ORAL_TABLET | ORAL | Status: DC

## 2011-10-25 MED ORDER — HYDROCODONE-ACETAMINOPHEN 7.5-750 MG PO TABS: 1.0000 | ORAL_TABLET | Freq: Three times a day (TID) | ORAL | Status: DC | PRN

## 2011-10-25 MED ORDER — FUROSEMIDE 20 MG PO TABS: 20.0000 mg | ORAL_TABLET | Freq: Every day | ORAL | Status: DC

## 2011-10-25 MED ORDER — CYCLOBENZAPRINE HCL 10 MG PO TABS: ORAL_TABLET | ORAL | Status: DC

## 2011-10-25 NOTE — Progress Notes (Signed)
Subjective:    Patient ID: Michaela Rios, female    DOB: 11-14-1951, 60 y.o.   MRN: [id number]  HPI 60 y/o BF here for a follow up visit... she has multiple medical problems as noted below...    ~  March 10, 2010:  she has mult somatic complaints- fatigue, ?uti, sinus drainage, dizziness she thinks is secondary to the Nasonex, etc... requesting refills & new epipen... we discussed f/u labs (all OK), and UA (prob UTI- Rx'd).  ~  October 19, 2010:  she has mult somatic complaints and a generally pos ROS... she has GERD & IBS and notes abd discomfort w/ N & D, noctural cough, occas choking while eating> she needs f/u w/ GI= DrDBrodie & we will set this up & change her Librax to Bentyl to see if this works better for her...  BP is controlled on the Lasix;  she remains on the same HA regimen from [facility], & requests refill Klonopin for her nerves...   ~  Apr 19, 2011:  38mo ROV & she continues to have major difficulty in 2 areas> GI & Headaches;  GI eval & rx by DrDBrodie w/ recurrent abd pain> s/p EGD (DU & candida esoph) & sonar (essent neg) treated w/ Protonix, Carafate, Librax, Bentyl;  Headache managed by Dr[facility] at [facility] but "they don't do pain meds" & we give her VicodinES 7.5mg  Tid prn, pt wondering about pain management & asked to take this up w/ Neurology... Due to her chronic daily headaches- her BP is sl elev at 140/90 & we discussed adding Verapamil SR 120mg  daily for both problems (==> states she stopped it because it gave her HAs)...  ~  October 25, 2011:  38mo ROV & she saw TP 10/12 for bronchitis & treated w/ Levaquin, Mucinex, Hydromet> improved;  She also had f/u appt [facility]Hagen in the [facility]- note reviewed- Dx as chronic migraines, tried on mult preventives, Botox didn't help, felt to suffer from medication overuse but she doesn't get it & reluctant to make changes> on Verap180, Topamax200Bid, Imitrex100, Zoloft200/d, Zanaflex4Bid, Flexeril10Tid, VicodinES7.5prn... Pt is  reminded to bring all her med bottles to all her office visits.    She requests referral back to DrESL for allergy eval;  BP well controlled on her Lasix Rx (note- DrHagen tried to incr Verap to 180mg /d);  GI appears stable on her Protonix , Carafate, Librax & Bentyl;            Problem List:  ALLERGIC RHINITIS (ICD-477.9) - uses OTC antihistamines as needed.... seen by DrLawrence & DrSJacobs for ENT in the past... also seen by DrESL in 2005 w/ pos reactions to shellfish, dust, trees, grass, weeds... she wants epipen to keep on hand.  Hx of PULMONARY NODULE (ICD-518.89) - prev CTChest in 2005, 2006, & 2007 w/ several small 4-47mm right middle and lower lobe nodules without change, felt to be benign... ~  CXR 7/10 showed Clear & NAD (mild tort Ao). ~  CXR 5/12:  Pt was requested to get f/u film today but she forgot to go to [facility] Dept!  ~  CXR 10/12 showed clear lungs, no nodules seen, tortuous Ao, mild scoliosis...  HYPERTENSION (ICD-401.9) - hx ACE cough in past... Prev controlled on LASIX 20mg /d...  ~  2DEcho 12/08 at [facility] showed norm LVF, mild MR, no source for emboli etc... ~  11/11:  BP= 128/82 today> denies fatigue, visual changes, CP, palipit, dizziness, syncope, dyspnea, edema, etc... ~  5/12:  BP= 140/90 & she notes chr daily HA, stress, etc... We decided to add VERAPAMIL SR 120mg /d (==> she stopped stating it caused HA). ~  11/12:  BP= 128/74 today & she denies CP, palpit, SOB, edema...  PERIPHERAL VASCULAR DISEASE (ICD-443.9) - CTChest in 2007 also showed ectasia of asc thor Ao... rec to take ASA 81mg /d... ~  CDopplers 12/08 at [facility] showed mild soft paque in CCAs bilat and 0-39% bilat ICA stenoses...  GERD & DUOD ULCER - on PROTONIX 40mg Bid + CARAFATE 1gmQid...  ~  hx chronic PUD w/ gastric outlet obstruction in past... 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 subseq surgery in 2003 by DrToth w/ duod stricture-plasty & vagotomy. ~  AbdSonar  3/12 showed distended GB, no stones, no ductal dil etc... ~  EGD 3/12 by DrBrodie showed +DU, remote pyloroplasty, gastritis (neg HPylori),candida esoph> treated w/ Diflucan, then incr PPI & Carafate...  IRRITABLE BOWEL SYNDROME (ICD-564.1) - on LIBRAX Qid vs BENTYL 20mg  Qid... ~  last colon in EMR= 11/99 & neg/ WNL... ~  11/11: she notes incr IBS symptoms & we decided to switch Librax to BENTYL 20mg  every 4-6H as needed. ~  2/12:  GI f/u DrBrodie> notes reviewed...  LOW BACK PAIN SYNDROME (ICD-724.2)  HEADACHE (ICD-784.0) - followed for yrs in the [facility] now)... currently taking: ZOLOFT 100mg - 2tabsQam,  TOPAMAX 200mg - 2tabdQhs,  IMITREX 100mg Prn,  FLEXERIL 10mg - 2 QhsPrn... [facility] me [facility] requires that Rx for narcotics be filled by the pt's primary physician- she is requesting VICODIN ES 7.5mg ... ~  [facility] notes reviewed...  ANXIETY (ICD-300.00) - on KLONOPIN 0.5mg - 1/2-1 tabTid Prn... she wants Rx for #270 for mail order... I explained to her that she needs ROV Q74mo for monitoring & to get these perscriptions refilled...  VITAMIN D DEFICIENCY (ICD-268.9) - now on Vit D OTC 1000 u daily... ~  labs 5/09 showed Vit D level = 7... started Vit D 50000 u weekly. ~  labs 1/10 showed Vit D level = 67... OK to switch to 1000 u OTC daily...  ANEMIA - on MVI w/ Fe + Calcium & Vit D supplements... ~  Labs 5/12 showed Hg= 11.4   Past Surgical History  Procedure Date  . Tonsillectomy and adenoidectomy     as a child  . Tubal ligation 1984  . Abdominal hysterectomy   . Pud surgery w/duod sticture-plasty and vagotomy 2003    Dr. Carolynne Edouard    Pt DID NOT BRING MED BOTTLES TO THE OV AS REQUESTED... Outpatient Encounter Prescriptions as of 10/25/2011  Medication Sig Dispense Refill  . calcium-vitamin D (OSCAL 500/200 D-3) 500-200 MG-UNIT per tablet Take 1 tablet by mouth twice daily       . clidinium-chlordiazePOXIDE (LIBRAX) 2.5-5 MG per capsule Take by mouth. Take  1 tablet by mouth twice daily       . clonazePAM (KLONOPIN) 0.5 MG tablet Take 1/2-1 tablet by mouth three times daily as needed for nerves  270 tablet  3  . cyclobenzaprine (FLEXERIL) 10 MG tablet Take 2 tablets by mouth once daily as needed  60 tablet  3  . dicyclomine (BENTYL) 20 MG tablet Take 20 mg by mouth every 6 (six) hours. Take as needed for abdominal cramping       . EPINEPHrine (EPIPEN 2-PAK) 0.3 mg/0.3 mL DEVI Inject 0.3 mg into the muscle once.        . furosemide (LASIX) 20 MG tablet Take  1 tablet (20 mg total) by mouth daily.  90 tablet  3  . HYDROcodone-acetaminophen (VICODIN ES) 7.5-750 MG per tablet Take 1 tablet by mouth every 8 (eight) hours as needed.  90 tablet  5  . meclizine (ANTIVERT) 25 MG tablet 25 mg. Take 1 tablet by mouth every 6 hours as needed for dizziness       . pantoprazole (PROTONIX) 40 MG tablet Take 1 tablet by mouth twice daily  180 tablet  2  . sertraline (ZOLOFT) 100 MG tablet 100 mg. Take 2 tablets by mouth once daily       . sucralfate (CARAFATE) 1 G tablet Take 1 tablet (1 g total) by mouth 4 (four) times daily.  360 tablet  0  . SUMAtriptan (IMITREX) 100 MG tablet Take 100 mg by mouth every 2 (two) hours as needed.        . topiramate (TOPAMAX) 200 MG tablet Take 200 mg by mouth 2 (two) times daily.        Marland Kitchen DISCONTD: verapamil (CALAN-SR) 120 MG CR tablet Take 1 tablet (120 mg total) by mouth daily.  90 tablet  3    Allergies  Allergen Reactions  . Amoxicillin     REACTION: hives  . Cephalexin     REACTION: hives  . Lisinopril     REACTION: ? hx of ACE cough...    Current Medications, Allergies, Past Medical History, Past Surgical History, Family History, and Social History were reviewed in [employer] record.    Review of Systems         See HPI - all other systems neg except as noted... The patient complains of dyspnea on exertion, abdominal pain, severe indigestion/heartburn, and muscle weakness.  The patient  denies anorexia, fever, weight loss, weight gain, vision loss, decreased hearing, hoarseness, chest pain, syncope, peripheral edema, prolonged cough, headaches, hemoptysis, melena, hematochezia, hematuria, incontinence, suspicious skin lesions, transient blindness, difficulty walking, depression, unusual weight change, abnormal bleeding, enlarged lymph nodes, and angioedema.    Objective:   Physical Exam     WD, WN, 59 y/o BF in NAD... weight = 137# VITAL SIGNS:  Reviewed w/ BP= 140/90 GENERAL:  Alert & oriented; pleasant & cooperative... HEENT:  [place]/AT, EOM-wnl, PERRLA, EACs-clear, TMs-wnl, NOSE-clear, THROAT-clear & wnl, sl sinus tender on palp. NECK:  Supple w/ fairROM; no JVD; normal carotid impulses w/o bruits; no thyromegaly or nodules palpated; no lymphadenopathy. CHEST:  Clear to P & A; without wheezes/ rales/ or rhonchi heard... HEART:  Regular Rhythm; without murmurs/ rubs/ or gallops. ABDOMEN:  Soft & nontender; normal bowel sounds; no organomegaly or masses detected. EXT: without deformities or arthritic changes; no varicose veins/ venous insuffic/ or edema. NEURO:  CN's intact;  no focal neuro deficits... DERM:  No lesions noted; no rash etc...  RADIOLOGY DATA:  Reviewed in the EPIC EMR & discussed w/ the patient...  LABORATORY DATA:  Reviewed in the EPIC EMR & discussed w/ the patient...   Assessment & Plan:   Hx Pulm Nodules>  Follow up CXR done 10/12 by TP & no nodules seen...  HBP>  We decided to add VERAPAMIL 120mg  daily for her BP & headaches...  GI>  Followed & managed by DrDBrodie w/ recent w/u reviewed w/ pt (DU & Gastritis)> on Protonix Bid, Carafate Qid, & Librax vs Bentyl for her IBS...  HEADACHES>  Followed & managed by [facility]Hagen in [facility]; pt inquired about Pain Management & she is asked to take this  up w/ DrHagen; we refilled Vicodin #90 as noted.  ANXIETY>  On Klonopin & this was refilled per request...  ANEMIA & Vit D defic>  She continues on  supplements.Marland KitchenMarland Kitchen

## 2011-10-25 NOTE — Patient Instructions (Signed)
Today we updated your med list in our EPIC system...    Continue your current medications the same...    We refilled the meds you requested...  We will arrange for an allergy re-evaluation from DrGene...  Call for any questions...  Let's plan a follow up visit in 6 months w/ FASTING blood work at that time.Marland KitchenMarland Kitchen

## 2011-11-15 ENCOUNTER — Telehealth: Payer: Self-pay | Admitting: Pulmonary Disease

## 2011-11-15 NOTE — Telephone Encounter (Signed)
No need for msg

## 2011-11-22 ENCOUNTER — Encounter: Payer: Self-pay | Admitting: Pulmonary Disease

## 2011-11-25 ENCOUNTER — Telehealth: Payer: Self-pay | Admitting: Pulmonary Disease

## 2011-11-30 NOTE — Telephone Encounter (Signed)
Pt called me back and she is aware that form has been left up front for her to come by and pick this up.  Pt voiced her understanding of this.

## 2011-11-30 NOTE — Telephone Encounter (Signed)
lmomtcb for pt to let her know her papers are ready to be picked up.

## 2011-12-15 ENCOUNTER — Other Ambulatory Visit: Payer: Self-pay | Admitting: Internal Medicine

## 2012-03-09 ENCOUNTER — Telehealth: Payer: Self-pay | Admitting: Pulmonary Disease

## 2012-03-09 MED ORDER — MECLIZINE HCL 25 MG PO TABS: ORAL_TABLET | ORAL | Status: DC

## 2012-03-09 NOTE — Telephone Encounter (Signed)
Called and spoke with the pt and she is aware of rx for the meclizine has been sent to her pharmacy.

## 2012-04-17 ENCOUNTER — Ambulatory Visit: Payer: Managed Care, Other (non HMO) | Admitting: Pulmonary Disease

## 2012-04-17 DIAGNOSIS — A4902 Methicillin resistant Staphylococcus aureus infection, unspecified site: Secondary | ICD-10-CM

## 2012-04-17 HISTORY — DX: Methicillin resistant Staphylococcus aureus infection, unspecified site: A49.02

## 2012-04-20 ENCOUNTER — Ambulatory Visit: Payer: Managed Care, Other (non HMO) | Admitting: Pulmonary Disease

## 2012-04-30 ENCOUNTER — Telehealth: Payer: Self-pay | Admitting: Pulmonary Disease

## 2012-04-30 NOTE — Telephone Encounter (Signed)
Per SN---best test is a nasal swab to check for staph.  Called and lmom to make pt aware of  SN recs.  Pt to call back with further questions or concerns.

## 2012-04-30 NOTE — Telephone Encounter (Signed)
SN please advise if there is a lab test to check to see if she still has a staph infection.  thanks

## 2012-05-01 ENCOUNTER — Telehealth: Payer: Self-pay | Admitting: Pulmonary Disease

## 2012-05-01 NOTE — Telephone Encounter (Signed)
Pt returned triage's call.  Holly D Pryor ° °

## 2012-05-01 NOTE — Telephone Encounter (Signed)
Leigh spoke with hospital lab and it's only done if they are inpatient. The only way she can have this checked again is if she goes back to [facility]. She voiced her understanding and had no questions

## 2012-05-01 NOTE — Telephone Encounter (Signed)
I spoke with pt and she is wanting to get the nasal swab to check for staph infection. I called the lab downstairs and they do not do nasal swabs. Michaela Rios is going to call the hospital lab to see if they do. Will call pt back once we find out where she can have this done at

## 2012-05-01 NOTE — Telephone Encounter (Signed)
lmomtcb x1 

## 2012-05-17 ENCOUNTER — Other Ambulatory Visit: Payer: Self-pay | Admitting: Pulmonary Disease

## 2012-05-17 MED ORDER — FUROSEMIDE 20 MG PO TABS: 20.0000 mg | ORAL_TABLET | Freq: Every day | ORAL | Status: DC

## 2012-05-18 ENCOUNTER — Other Ambulatory Visit: Payer: Self-pay | Admitting: Pulmonary Disease

## 2012-05-18 MED ORDER — FUROSEMIDE 20 MG PO TABS: 20.0000 mg | ORAL_TABLET | Freq: Every day | ORAL | Status: DC

## 2012-05-18 NOTE — Telephone Encounter (Signed)
[facility] mail order requested new rx for lasix rx sent

## 2012-05-29 ENCOUNTER — Ambulatory Visit (INDEPENDENT_AMBULATORY_CARE_PROVIDER_SITE_OTHER): Payer: Managed Care, Other (non HMO) | Admitting: Pulmonary Disease

## 2012-05-29 ENCOUNTER — Encounter: Payer: Self-pay | Admitting: Pulmonary Disease

## 2012-05-29 VITALS — BP 134/88 | HR 79 | Temp 97.1°F | Ht 64.0 in | Wt 137.6 lb

## 2012-05-29 DIAGNOSIS — Z862 Personal history of diseases of the blood and blood-forming organs and certain disorders involving the immune mechanism: Secondary | ICD-10-CM

## 2012-05-29 DIAGNOSIS — M545 Low back pain, unspecified: Secondary | ICD-10-CM

## 2012-05-29 DIAGNOSIS — E7800 Pure hypercholesterolemia, unspecified: Secondary | ICD-10-CM

## 2012-05-29 DIAGNOSIS — I739 Peripheral vascular disease, unspecified: Secondary | ICD-10-CM

## 2012-05-29 DIAGNOSIS — R51 Headache: Secondary | ICD-10-CM

## 2012-05-29 DIAGNOSIS — K219 Gastro-esophageal reflux disease without esophagitis: Secondary | ICD-10-CM

## 2012-05-29 DIAGNOSIS — F411 Generalized anxiety disorder: Secondary | ICD-10-CM

## 2012-05-29 DIAGNOSIS — K277 Chronic peptic ulcer, site unspecified, without hemorrhage or perforation: Secondary | ICD-10-CM

## 2012-05-29 DIAGNOSIS — I10 Essential (primary) hypertension: Secondary | ICD-10-CM

## 2012-05-29 DIAGNOSIS — K589 Irritable bowel syndrome without diarrhea: Secondary | ICD-10-CM

## 2012-05-29 DIAGNOSIS — J309 Allergic rhinitis, unspecified: Secondary | ICD-10-CM

## 2012-05-29 MED ORDER — MOMETASONE FUROATE 50 MCG/ACT NA SUSP: 2.0000 | Freq: Every day | NASAL | Status: DC

## 2012-05-29 MED ORDER — HYDROCODONE-ACETAMINOPHEN 7.5-750 MG PO TABS: 1.0000 | ORAL_TABLET | Freq: Three times a day (TID) | ORAL | Status: DC | PRN

## 2012-05-29 MED ORDER — MECLIZINE HCL 25 MG PO TABS: ORAL_TABLET | ORAL | Status: DC

## 2012-05-29 MED ORDER — CYCLOBENZAPRINE HCL 10 MG PO TABS: ORAL_TABLET | ORAL | Status: AC

## 2012-05-29 MED ORDER — CLONAZEPAM 0.5 MG PO TABS: ORAL_TABLET | ORAL | Status: DC

## 2012-05-29 MED ORDER — FUROSEMIDE 20 MG PO TABS: 20.0000 mg | ORAL_TABLET | Freq: Every day | ORAL | Status: DC

## 2012-05-29 NOTE — Patient Instructions (Addendum)
Today we updated your med list in our EPIC system...    Continue your current medications the same...    We refilled the meds you requested...  Please return to our lab one morning soon for your FASTING blood work...    We will call you w/ the results when avail...  Stay as active as possible & try to get 7-8H of good sleep nightly...  Let's plan a follow up visit in 6 months.Marland KitchenMarland Kitchen

## 2012-05-29 NOTE — Progress Notes (Signed)
Subjective:    Patient ID: Michaela Rios, female    DOB: May 14, 1951, 61 y.o.   MRN: [id number]  HPI 61 y/o BF here for a follow up visit... she has multiple medical problems as noted below...    ~  March 10, 2010:  she has mult somatic complaints- fatigue, ?uti, sinus drainage, dizziness she thinks is secondary to the Nasonex, etc... requesting refills & new epipen... we discussed f/u labs (all OK), and UA (prob UTI- Rx'd).  ~  October 19, 2010:  she has mult somatic complaints and a generally pos ROS... she has GERD & IBS and notes abd discomfort w/ N & D, noctural cough, occas choking while eating> she needs f/u w/ GI= DrDBrodie & we will set this up & change her Librax to Bentyl to see if this works better for her...  BP is controlled on the Lasix;  she remains on the same HA regimen from [facility], & requests refill Klonopin for her nerves...   ~  Apr 19, 2011:  91mo ROV & she continues to have major difficulty in 2 areas> GI & Headaches;  GI eval & rx by DrDBrodie w/ recurrent abd pain> s/p EGD (DU & candida esoph) & sonar (essent neg) treated w/ Protonix, Carafate, Librax, Bentyl;  Headache managed by DrHagen at [facility] but "they don't do pain meds" & we give her VicodinES 7.5mg  Tid prn, pt wondering about pain management & asked to take this up w/ Neurology... Due to her chronic daily headaches- her BP is sl elev at 140/90 & we discussed adding Verapamil SR 120mg  daily for both problems (==> states she stopped it because it gave her HAs)...  ~  October 25, 2011:  91mo ROV & she saw TP 10/12 for bronchitis & treated w/ Levaquin, Mucinex, Hydromet> improved;  She also had f/u appt DrHagen in the [facility]- note reviewed- Dx as chronic migraines, tried on mult preventives, Botox didn't help, felt to suffer from medication overuse but she doesn't get it & reluctant to make changes> on Verap180, Topamax200Bid, Imitrex100, Zoloft200/d, Zanaflex4Bid, Flexeril10Tid, VicodinES7.5prn... Pt is  reminded to bring all her med bottles to all her office visits.    She requests referral back to DrESL for allergy eval;  BP well controlled on her Lasix Rx (note- DrHagen tried to incr Verap to 180mg /d);  GI appears stable on her Protonix , Carafate, Librax & Bentyl.  ~  May 29, 2012:  68mo ROV & Michaela Rios reports continued difficulties w/ HAs & GI issues> she had an episode of MRSA on her left flank area 5/13- went to an [facility] & had it lanced w/ culture pos for MRSA she tells me; treated w/ Ab shots and pills (she doesn't know the names & didin't bring her list or bottles); it has totally resolved now...    She has freq migraines and is limited to #27 Imitrex Q73mo by her insurance & I asked her to check w/ [facility]; last Neuro note from DrHagen was 11/12 & she is reminded to f/u w/ the [facility]; on Topamax, Klonopin, Zoloft regularly- and Flexeril, Imitrex, & VicodinES as needed...    She also c/o increased GI symptoms & has f/u DrDBrodie soon; last seen 3/12 as noted below> hx GERD, DU, IBS & she has Protonix, Librax & Bentyl to use as needed...    She saw [facility]- "DrPeriwinkle" she said & repeat allergy testing showed reactions to> feathers, dust mites, weeds, cockroach, trees, molds;  she was also allergic to shellfish and she notes reactions to peanuts; she has an Epipen for prn use...    We reviewed prob list, meds, xrays and labs> see below>> LABS 7/13:  She will ret for fasting blood work- pending...          Problem List:  ALLERGIC RHINITIS (ICD-477.9) - uses OTC antihistamines as needed.... seen by DrLawrence & DrSJacobs for ENT in the past... also seen by DrESL in 2005 w/ pos reactions to shellfish, dust, trees, grass, weeds... she wants epipen to keep on hand.  Hx of PULMONARY NODULE (ICD-518.89) - prev CTChest in 2005, 2006, & 2007 w/ several small 4-24mm right middle and lower lobe nodules without change, felt to be benign... ~  CXR 7/10 showed Clear & NAD (mild tort Ao). ~  CXR  5/12:  Pt was requested to get f/u film today but she forgot to go to [facility]!  ~  CXR 10/12 showed clear lungs, no nodules seen, tortuous Ao, mild scoliosis...  HYPERTENSION (ICD-401.9) - hx ACE cough in past... Prev controlled on LASIX 20mg /d...  ~  2[facility]cho 12/08 at [facility] showed norm LVF, mild MR, no source for emboli etc... ~  11/11:  BP= 128/82 today> denies fatigue, visual changes, CP, palipit, dizziness, syncope, dyspnea, edema, etc... ~  5/12:  BP= 140/90 & she notes chr daily HA, stress, etc... We decided to add VERAPAMIL SR 120mg /d (==> she stopped stating it caused HA). ~  11/12:  BP= 128/74 today & she denies CP, palpit, SOB, edema... ~  7/13:  BP= 134/88 & she continues to dey CP, palpit, SOB, edema...  PERIPHERAL VASCULAR DISEASE (ICD-443.9) - CTChest in 2007 also showed ectasia of asc thor Ao... rec to take ASA 81mg /d... ~  CDopplers 12/08 at [facility] showed mild soft paque in CCAs bilat and 0-39% bilat ICA stenoses...  GERD & DUOD ULCER - on PROTONIX 40mg Bid + off prev Carafate...  ~  hx chronic PUD w/ gastric outlet obstruction in past... 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 subseq surgery in 2003 by DrToth w/ duod stricture-plasty & vagotomy. ~  AbdSonar 3/12 showed distended GB, no stones, no ductal dil etc... ~  EGD 3/12 by DrBrodie showed +DU, remote pyloroplasty, gastritis (neg HPylori),candida esoph> treated w/ Diflucan, then incr PPI & Carafate...  IRRITABLE BOWEL SYNDROME (ICD-564.1) - on LIBRAX Qid vs BENTYL 20mg  Qid... ~  last colon in EMR= 11/99 & neg/ WNL... ~  11/11: she notes incr IBS symptoms & we decided to switch Librax to BENTYL 20mg  every 4-6H as needed. ~  2/12:  GI f/u DrBrodie> notes reviewed...  LOW BACK PAIN SYNDROME (ICD-724.2)  HEADACHE (ICD-784.0) - followed for yrs in the [facility] now)... currently taking: ZOLOFT 100mg - 2tabsQam,  TOPAMAX 200mg - 2tabdQhs,  IMITREX 100mg Prn,  FLEXERIL 10mg - 2  QhsPrn... she tells me that [facility] requires that Rx for narcotics be filled by the pt's primary physician- she is requesting VICODIN ES 7.5mg ... ~  [facility] notes reviewed...  ANXIETY (ICD-300.00) - on KLONOPIN 0.5mg - 1/2-1 tabTid Prn... she wants Rx for #270 for mail order... I explained to her that she needs ROV Q25mo for monitoring & to get these perscriptions refilled...  VITAMIN D DEFICIENCY (ICD-268.9) - now on Vit D OTC 1000 u daily... ~  labs 5/09 showed Vit D level = 7... started Vit D 50000 u weekly. ~  labs 1/10 showed Vit D level = 67... OK to switch  to 1000 u OTC daily...  ANEMIA - on MVI w/ Fe + Calcium & Vit D supplements... ~  Labs 5/12 showed Hg= 11.4   Past Surgical History  Procedure Date  . Tonsillectomy and adenoidectomy     as a child  . Tubal ligation 1984  . Abdominal hysterectomy   . Pud surgery w/duod sticture-plasty and vagotomy 2003    Dr. Carolynne Edouard    Pt DID NOT BRING MED BOTTLES TO THE OV AS REQUESTED... Outpatient Encounter Prescriptions as of 05/29/2012  Medication Sig Dispense Refill  . calcium-vitamin D (OSCAL 500/200 D-3) 500-200 MG-UNIT per tablet Take 1 tablet by mouth twice daily       . clonazePAM (KLONOPIN) 0.5 MG tablet Take 1/2-1 tablet by mouth three times daily as needed for nerves  270 tablet  3  . cyclobenzaprine (FLEXERIL) 10 MG tablet Take 2 tablets by mouth once daily as needed  60 tablet  5  . dicyclomine (BENTYL) 20 MG tablet Take 20 mg by mouth every 6 (six) hours. Take as needed for abdominal cramping       . EPINEPHrine (EPIPEN 2-PAK) 0.3 mg/0.3 mL DEVI Inject 0.3 mg into the muscle once.        . furosemide (LASIX) 20 MG tablet Take 1 tablet (20 mg total) by mouth daily.  90 tablet  0  . HYDROcodone-acetaminophen (VICODIN ES) 7.5-750 MG per tablet Take 1 tablet by mouth every 8 (eight) hours as needed.  90 tablet  5  . meclizine (ANTIVERT) 25 MG tablet Take 1 tablet by mouth every 6 hours as needed for dizziness  90 tablet  5  .  pantoprazole (PROTONIX) 40 MG tablet TAKE 1 TABLET TWICE A DAY  180 tablet  0  . sertraline (ZOLOFT) 100 MG tablet 100 mg. Take 2 tablets by mouth once daily       . SUMAtriptan (IMITREX) 100 MG tablet Take 100 mg by mouth every 2 (two) hours as needed.        . topiramate (TOPAMAX) 200 MG tablet Take 200 mg by mouth 2 (two) times daily.        . clidinium-chlordiazePOXIDE (LIBRAX) 2.5-5 MG per capsule Take by mouth. Take 1 tablet by mouth twice daily       . DISCONTD: sucralfate (CARAFATE) 1 G tablet Take 1 tablet (1 g total) by mouth 4 (four) times daily.  360 tablet  0    Allergies  Allergen Reactions  . Amoxicillin     REACTION: hives  . Cephalexin     REACTION: hives  . Lisinopril     REACTION: ? hx of ACE cough...    Current Medications, Allergies, Past Medical History, Past Surgical History, Family History, and Social History were reviewed in [employer] record.    Review of Systems         See HPI - all other systems neg except as noted... The patient complains of dyspnea on exertion, abdominal pain, severe indigestion/heartburn, and muscle weakness.  The patient denies anorexia, fever, weight loss, weight gain, vision loss, decreased hearing, hoarseness, chest pain, syncope, peripheral edema, prolonged cough, headaches, hemoptysis, melena, hematochezia, hematuria, incontinence, suspicious skin lesions, transient blindness, difficulty walking, depression, unusual weight change, abnormal bleeding, enlarged lymph nodes, and angioedema.    Objective:   Physical Exam     WD, WN, 60 y/o BF in NAD... GENERAL:  Alert & oriented; pleasant & cooperative... HEENT:  [place]/AT, EOM-wnl, PERRLA, EACs-clear, TMs-wnl, NOSE-clear, THROAT-clear & wnl,  sl sinus tender on palp. NECK:  Supple w/ fairROM; no JVD; normal carotid impulses w/o bruits; no thyromegaly or nodules palpated; no lymphadenopathy. CHEST:  Clear to P & A; without wheezes/ rales/ or rhonchi  heard... HEART:  Regular Rhythm; without murmurs/ rubs/ or gallops. ABDOMEN:  Soft & nontender; normal bowel sounds; no organomegaly or masses detected. EXT: without deformities or arthritic changes; no varicose veins/ venous insuffic/ or edema. NEURO:  CN's intact;  no focal neuro deficits... DERM:  No lesions noted; no rash etc...  RADIOLOGY DATA:  Reviewed in the EPIC EMR & discussed w/ the patient...  LABORATORY DATA:  Reviewed in the EPIC EMR & discussed w/ the patient...   Assessment & Plan:   Hx Pulm Nodules>  Follow up CXR done 10/12 by TP & no nodules seen...  HBP>  We prev tried to add VERAPAMIL 120mg  daily for her BP & headaches but she stopped stating it CAUSED HAs...  GI>  Followed & managed by DrDBrodie w/ recent w/u reviewed w/ pt (DU & Gastritis)> on Protonix Bid, & Librax vs Bentyl for her IBS...  HEADACHES>  Followed & managed by DrHagen in the [facility]; pt inquired about Pain Management & she is asked to take this up w/ DrHagen; we refilled Vicodin #90 as noted.  ANXIETY>  On Klonopin & this was refilled per request...  ANEMIA & Vit D defic>  She continues on supplements...   Patient's Medications  New Prescriptions   MOMETASONE (NASONEX) 50 MCG/ACT NASAL SPRAY    Place 2 sprays into the nose daily.  Previous Medications   CALCIUM-VITAMIN D (OSCAL 500/200 D-3) 500-200 MG-UNIT PER TABLET    Take 1 tablet by mouth twice daily    CLIDINIUM-CHLORDIAZEPOXIDE (LIBRAX) 2.5-5 MG PER CAPSULE    Take by mouth. Take 1 tablet by mouth twice daily    DICYCLOMINE (BENTYL) 20 MG TABLET    Take 20 mg by mouth every 6 (six) hours. Take as needed for abdominal cramping    EPINEPHRINE (EPIPEN 2-PAK) 0.3 MG/0.3 ML DEVI    Inject 0.3 mg into the muscle once.     PANTOPRAZOLE (PROTONIX) 40 MG TABLET    TAKE 1 TABLET TWICE A DAY   SERTRALINE (ZOLOFT) 100 MG TABLET    100 mg. Take 2 tablets by mouth once daily    SUMATRIPTAN (IMITREX) 100 MG TABLET    Take 100 mg by mouth every 2  (two) hours as needed.     TOPIRAMATE (TOPAMAX) 200 MG TABLET    Take 200 mg by mouth 2 (two) times daily.    Modified Medications   Modified Medication Previous Medication   CLONAZEPAM (KLONOPIN) 0.5 MG TABLET clonazePAM (KLONOPIN) 0.5 MG tablet      Take 1/2-1 tablet by mouth three times daily as needed for nerves    Take 1/2-1 tablet by mouth three times daily as needed for nerves   CYCLOBENZAPRINE (FLEXERIL) 10 MG TABLET cyclobenzaprine (FLEXERIL) 10 MG tablet      Take 2 tablets by mouth once daily as needed    Take 2 tablets by mouth once daily as needed   FUROSEMIDE (LASIX) 20 MG TABLET furosemide (LASIX) 20 MG tablet      Take 1 tablet (20 mg total) by mouth daily.    Take 1 tablet (20 mg total) by mouth daily.   HYDROCODONE-ACETAMINOPHEN (VICODIN ES) 7.5-750 MG PER TABLET HYDROcodone-acetaminophen (VICODIN ES) 7.5-750 MG per tablet      Take 1 tablet by mouth  every 8 (eight) hours as needed.    Take 1 tablet by mouth every 8 (eight) hours as needed.   MECLIZINE (ANTIVERT) 25 MG TABLET meclizine (ANTIVERT) 25 MG tablet      Take 1 tablet by mouth every 6 hours as needed for dizziness    Take 1 tablet by mouth every 6 hours as needed for dizziness  Discontinued Medications   SUCRALFATE (CARAFATE) 1 G TABLET    Take 1 tablet (1 g total) by mouth 4 (four) times daily.

## 2012-06-12 ENCOUNTER — Ambulatory Visit (INDEPENDENT_AMBULATORY_CARE_PROVIDER_SITE_OTHER): Payer: Managed Care, Other (non HMO) | Admitting: Internal Medicine

## 2012-06-12 ENCOUNTER — Encounter: Payer: Self-pay | Admitting: Internal Medicine

## 2012-06-12 VITALS — BP 130/98 | HR 66 | Ht 63.0 in | Wt 134.6 lb

## 2012-06-12 DIAGNOSIS — R1013 Epigastric pain: Secondary | ICD-10-CM

## 2012-06-12 DIAGNOSIS — K279 Peptic ulcer, site unspecified, unspecified as acute or chronic, without hemorrhage or perforation: Secondary | ICD-10-CM

## 2012-06-12 MED ORDER — CILIDINIUM-CHLORDIAZEPOXIDE 2.5-5 MG PO CAPS: 1.0000 | ORAL_CAPSULE | Freq: Two times a day (BID) | ORAL | Status: DC

## 2012-06-12 MED ORDER — SUCRALFATE 1 GM/10ML PO SUSP: 1.0000 g | Freq: Four times a day (QID) | ORAL | Status: DC

## 2012-06-12 MED ORDER — SUCRALFATE 1 GM/10ML PO SUSP: 1.0000 g | Freq: Four times a day (QID) | ORAL | Status: AC

## 2012-06-12 NOTE — Progress Notes (Signed)
Michaela Rios 1951/08/06 MRN [id number]  History of Present Illness:  This is a 61 year old African American female with peptic ulcer disease and recurrent epigastric pain. She had a pyloric channel ulcer on an upper endoscopy in March 2012 which was H. pylori negative. She was also treated for Candida esophagitis and retained bile. She used Carafate, Protonix and Librax. She also took one month FMLA leave. Biopsies from the stomach showed reactive gastropathy. Patient is now having epigastric pain which is worse after meals. She underwent a truncal vagotomy and stricturoplasty in January 2002 for peptic ulcer disease. She had a recurrent peptic ulcer in 2004 and pancreatitis in 2004. Her amylase was elevated to 223. Her upper abdominal ultrasound was negative. A colonoscopy in 1999 was negative. She takes Imitrex for headaches.   Past Medical History  Diagnosis Date  . Pulmonary nodule   . Hypertension   . Peripheral vascular disease   . GERD (gastroesophageal reflux disease)   . IBS (irritable bowel syndrome)   . Low back pain syndrome   . Headache   . Anxiety   . Vitamin d deficiency   . Peptic ulcer disease   . Esophagitis   . MRSA (methicillin resistant Staphylococcus aureus) infection 04/17/12    left torso   Past Surgical History  Procedure Date  . Tonsillectomy and adenoidectomy     as a child  . Tubal ligation 1984  . Abdominal hysterectomy   . Pud surgery w/duod sticture-plasty and vagotomy 2003    Dr. Carolynne Edouard    reports that she has never smoked. She has never used smokeless tobacco. She reports that she drinks alcohol. She reports that she does not use illicit drugs. family history includes Aneurysm in her father and Prostate cancer in her brother.  There is no history of Colon cancer. Allergies  Allergen Reactions  . Amoxicillin     REACTION: hives  . Cephalexin     REACTION: hives  . Lisinopril     REACTION: ? hx of ACE cough...        Review of Systems:  Denies dysphagia odynophagia or shortness of breath or chest pain  The remainder of the 10 point ROS is negative except as outlined in H&P   Physical Exam: General appearance  Well developed, in no distress. Eyes- non icteric. HEENT nontraumatic, normocephalic. Mouth no lesions, tongue papillated, no cheilosis. Neck supple without adenopathy, thyroid not enlarged, no carotid bruits, no JVD. Lungs Clear to auscultation bilaterally. Cor normal S1, normal S2, regular rhythm, no murmur,  quiet precordium. Abdomen: Tender epigastrium and along left upper quadrant. No fullness. No distention. Normal active bowel sounds. Rectal: Not done. Extremities no pedal edema. Skin no lesions. Neurological alert and oriented x 3. Psychological normal mood and affect.  Assessment and Plan:  Problem #1 Recurrent epigastric and left upper quadrant abdominal pain consistent with recurrent peptic ulcer disease. She did not respond to vagotomy and stricturoplasty in the past. Stress has a lot to do with her symptoms. We will add Carafate slurry 10 cc by mouth 4 times a day and Librax one twice a day. She wishes to continue to work. We will switch her from Protonix to Nexium 40 mg twice a day. If symptoms continue, we will proceed with an upper endoscopy.  Problem #2 Colorectal screening. Her last colonoscopy was in 1999. She is due for a recall colonoscopy. This may be done together with upper endoscopy.  06/12/2012 [name]

## 2012-06-12 NOTE — Patient Instructions (Addendum)
We have sent the following medications to your pharmacy for you to pick up at your convenience: Carafate Librax We have given you samples of the following medication to take: Nexium Call us in 1 week to give [place] an update on your condition You are due to have screening colonoscopy this year.  CC: Dr Alroy Dust

## 2012-06-25 ENCOUNTER — Telehealth: Payer: Self-pay | Admitting: Internal Medicine

## 2012-06-25 NOTE — Telephone Encounter (Signed)
Left a message for patient to call me. 

## 2012-06-25 NOTE — Telephone Encounter (Signed)
Patient left a message that she called. Called her and left message again.

## 2012-06-25 NOTE — Telephone Encounter (Signed)
Patient calling to let MD know the Nexium helped her a lot. She is out of samples and wants to know if she needs to get an rx for this and if she should continue it BID. Please, advise

## 2012-06-25 NOTE — Telephone Encounter (Signed)
Pt called in and said Dr. Juanda Chance put her on liquid carafate and Librax. She said she got a letter stating she will not be able to get her RX's because Dr. Has cancelled the prescriptions. Would like a call back to discuss. If you call and she doesn't answer, please leave a message and she will call right back.

## 2012-06-25 NOTE — Telephone Encounter (Signed)
Please send prescription for Nexiem 40 mg, #30, 1 po qd, NOT bid., 6 refills.

## 2012-06-26 MED ORDER — ESOMEPRAZOLE MAGNESIUM 40 MG PO CPDR: 40.0000 mg | DELAYED_RELEASE_CAPSULE | Freq: Every day | ORAL | Status: DC

## 2012-06-26 NOTE — Telephone Encounter (Signed)
Pt aware and rx sent to pharmacy. 

## 2012-10-29 ENCOUNTER — Telehealth: Payer: Self-pay | Admitting: Pulmonary Disease

## 2012-10-29 NOTE — Telephone Encounter (Signed)
Spoke with patient she states that since her staph infection in May 2013 she just "has not been right." Patient statesthat for past 2 weeks she ahs been having increased BP, headaches (not her migraines) and just has not been feeling well.  States her BP today was 138/100.  Patient reports that her diastolic number "willnot go under 100."  Patient would like recs from Dr. Kriste Basque and how to proceed with these symptoms.  Dr. Kriste Basque please advise , thank you!  Last OV:05/29/12 6 month f/u Next OV:12/05/12  Allergies  Allergen Reactions  . Amoxicillin     REACTION: hives  . Cephalexin     REACTION: hives  . Lisinopril     REACTION: ? hx of ACE cough.Marland KitchenMarland Kitchen

## 2012-10-29 NOTE — Telephone Encounter (Signed)
Per SN plendol 5 mg QD-this will get her dystolic under 100. lmomtcb x1

## 2012-10-29 NOTE — Telephone Encounter (Signed)
ATC x2--no answer. LMOMTCB

## 2012-10-30 MED ORDER — FELODIPINE ER 5 MG PO TB24: 5.0000 mg | ORAL_TABLET | Freq: Every day | ORAL | Status: DC

## 2012-10-30 NOTE — Telephone Encounter (Signed)
i spoke with pt and is aware of SN recs. She voiced her understanding and needed nothing further. rx has been sent to the pharmacy

## 2012-11-25 ENCOUNTER — Other Ambulatory Visit: Payer: Self-pay | Admitting: Pulmonary Disease

## 2012-11-27 ENCOUNTER — Telehealth: Payer: Self-pay | Admitting: Internal Medicine

## 2012-11-29 NOTE — Telephone Encounter (Signed)
Left a message for patient to call me. 

## 2012-11-30 NOTE — Telephone Encounter (Signed)
Spoke with patient and she has an appointment with Dr. Kriste Basque on 12/05/12. She wants to see him and then decide if she needs to schedule an OV with Dr. Juanda Chance.

## 2012-12-05 ENCOUNTER — Encounter: Payer: Self-pay | Admitting: Pulmonary Disease

## 2012-12-05 ENCOUNTER — Ambulatory Visit (INDEPENDENT_AMBULATORY_CARE_PROVIDER_SITE_OTHER): Payer: BC Managed Care – PPO | Admitting: Pulmonary Disease

## 2012-12-05 VITALS — BP 134/100 | HR 93 | Temp 97.5°F | Ht 64.0 in | Wt 124.8 lb

## 2012-12-05 DIAGNOSIS — M545 Low back pain, unspecified: Secondary | ICD-10-CM

## 2012-12-05 DIAGNOSIS — N390 Urinary tract infection, site not specified: Secondary | ICD-10-CM

## 2012-12-05 DIAGNOSIS — I739 Peripheral vascular disease, unspecified: Secondary | ICD-10-CM

## 2012-12-05 DIAGNOSIS — K589 Irritable bowel syndrome without diarrhea: Secondary | ICD-10-CM

## 2012-12-05 DIAGNOSIS — E559 Vitamin D deficiency, unspecified: Secondary | ICD-10-CM

## 2012-12-05 DIAGNOSIS — E7800 Pure hypercholesterolemia, unspecified: Secondary | ICD-10-CM

## 2012-12-05 DIAGNOSIS — R51 Headache: Secondary | ICD-10-CM

## 2012-12-05 DIAGNOSIS — K219 Gastro-esophageal reflux disease without esophagitis: Secondary | ICD-10-CM

## 2012-12-05 DIAGNOSIS — I10 Essential (primary) hypertension: Secondary | ICD-10-CM

## 2012-12-05 DIAGNOSIS — K277 Chronic peptic ulcer, site unspecified, without hemorrhage or perforation: Secondary | ICD-10-CM

## 2012-12-05 DIAGNOSIS — F411 Generalized anxiety disorder: Secondary | ICD-10-CM

## 2012-12-05 MED ORDER — FELODIPINE ER 5 MG PO TB24: 5.0000 mg | ORAL_TABLET | Freq: Every day | ORAL | Status: DC

## 2012-12-05 MED ORDER — FUROSEMIDE 20 MG PO TABS: 20.0000 mg | ORAL_TABLET | Freq: Every day | ORAL | Status: DC

## 2012-12-05 MED ORDER — HYDROCODONE-ACETAMINOPHEN 7.5-300 MG PO TABS: ORAL_TABLET | ORAL | Status: DC

## 2012-12-05 MED ORDER — CLONAZEPAM 0.5 MG PO TABS: ORAL_TABLET | ORAL | Status: DC

## 2012-12-05 NOTE — Progress Notes (Signed)
Subjective:    Patient ID: Michaela Rios, female    DOB: [birth year], 62 y.o.   MRN: [id number]  HPI 62 y/o BF here for a follow up visit... she has multiple medical problems as noted below...    ~  October 19, 2010:  she has mult somatic complaints and a generally pos ROS... she has GERD & IBS and notes abd discomfort w/ N & D, noctural cough, occas choking while eating> she needs f/u w/ GI= DrDBrodie & we will set this up & change her Librax to Bentyl to see if this works better for her...  BP is controlled on the Lasix;  she remains on the same HA regimen from [facility], & requests refill Klonopin for her nerves...   ~  Apr 19, 2011:  42mo ROV & she continues to have major difficulty in 2 areas> GI & Headaches;  GI eval & rx by DrDBrodie w/ recurrent abd pain> s/p EGD (DU & candida esoph) & sonar (essent neg) treated w/ Protonix, Carafate, Librax, Bentyl;  Headache managed by Dr[facility]agen at [facility] but "they don't do pain meds" & we give her VicodinES 7.5mg  Tid prn, pt wondering about pain management & asked to take this up w/ Neurology... Due to her chronic daily headaches- her BP is sl elev at 140/90 & we discussed adding Verapamil SR 120mg  daily for both problems (==> states she stopped it because it gave her HAs)...  ~  October 25, 2011:  42mo ROV & she saw TP 10/12 for bronchitis & treated w/ Levaquin, Mucinex, Hydromet> improved;  She also had f/u appt DrHagen in the [facility] clinic- note reviewed- Dx as chronic migraines, tried on mult preventives, Botox didn't help, felt to suffer from medication overuse but she doesn't get it & reluctant to make changes> on Verap180, Topamax200Bid, Imitrex100, Zoloft200/d, Zanaflex4Bid, Flexeril10Tid, VicodinES7.5prn... Pt is reminded to bring all her med bottles to all her office visits.    She requests referral back to DrESL for allergy eval;  BP well controlled on her Lasix Rx (note- DrHagen tried to incr Verap to 180mg /d);  GI appears stable on her  Protonix , Carafate, Librax & Bentyl.  ~  May 29, 2012:  5mo ROV & Orella reports continued difficulties w/ HAs & GI issues> she had an episode of MRSA on her left flank area 5/13- went to an [facility] & had it lanced w/ culture pos for MRSA she tells me; treated w/ Ab shots and pills (she doesn't know the names & didin't bring her list or bottles); it has totally resolved now...    She has freq migraines and is limited to #27 Imitrex Q72mo by her insurance & I asked her to check w/ [facility]; last Neuro note from DrHagen was 11/12 & she is reminded to f/u w/ the [facility] clinic; on Topamax, Klonopin, Zoloft regularly- and Flexeril, Imitrex, & VicodinES as needed...    She also c/o increased GI symptoms & has f/u DrDBrodie soon; last seen 3/12 as noted below> hx GERD, DU, IBS & she has Protonix, Librax & Bentyl to use as needed...    She saw [facility]- "DrPeriwinkle" she said & repeat allergy testing showed reactions to> feathers, dust mites, weeds, cockroach, trees, molds; she was also allergic to shellfish and she notes reactions to peanuts; she has an Epipen for prn use...    We reviewed prob list, meds, xrays and labs> see below>> LABS 7/13:  She will ret for fasting blood  work- pending...  ~  December 05, 2012:  62mo ROV & [name] has a generally pos ROS noting a virus over the holidays, went to [facility] & told bladder infection; she got sick after 3d Cipro therefore stopped it & wants f/u UA today; c/o no energy, trouble sleeping, incr stress w/ brother who has ca; etc... We reviewed the following medical problems during today's office visit>>     Hx pulm nodule> hx several sm RML/RLL 4-54mm nodules w/o change on prev CT scans; breathing is good- min cough w/o sput, no hemptysis, denies SOB; she has not been active & needs exercise program...    HBP> on Plendil5, Lasix20; BP= 134/90 after rest; she denies CP, palpit, SOB, edema, etc; hx ACE cough...    ASPVD> supposed to be on ASA81; hx ectasia of Ascending Thor  Ao & soft plaque in CCAs bilat on CDopplers...    GI- GERD, DU, IBS> on Nexium, Carafate, Librax/ Bentyl; managed by DrDBrodie & her notes are reviewed...    GU- recent UTI> as above; f/u UA today is clear, no resid infection...    LBP, VitD defic> on Flexeril prn, Calcium, MVI; she is managing satis...     HAs> on VicodinES-7.5mg  prn, Imitrex100, Topamax200Bid per [facility]; we do not have any recent notes from them...    Anxiety> on Klonopin0.5Tid prn, Zoloft100; she does satis w/ these meds...    Anemia> Hg in the 11-12 range & stable... We reviewed prob list, meds, xrays and labs> see below for updates >>  LABS 1/14:  FLP- ok on diet w/ LDL=111;  Chems- wnl;  CBC- ok w/ Hg=11.9;  TSH=1.29;  VitD=13 & she needs 50K supplement weekly;  UA- microheme, no infection.           Problem List:  ALLERGIC RHINITIS (ICD-477.9) - uses OTC antihistamines as needed.... seen by DrLawrence & DrSJacobs for ENT in the past... also seen by DrESL in 2005 w/ pos reactions to shellfish, dust, trees, grass, weeds... she wants epipen to keep on hand.  Hx of PULMONARY NODULE (ICD-518.89) - prev CTChest in 2005, 2006, & 2007 w/ several small 4-9mm right middle and lower lobe nodules without change, felt to be benign... ~  CXR 7/10 showed Clear & NAD (mild tort Ao). ~  CXR 5/12:  Pt was requested to get f/u film today but she forgot to go to [facility]!  ~  CXR 10/12 showed clear lungs, no nodules seen, tortuous Ao, mild scoliosis...  HYPERTENSION (ICD-401.9) - hx ACE cough in past... Prev controlled on LASIX 20mg /d...  ~  [facility] 12/[facility] showed norm LVF, mild MR, no source for emboli etc... ~  11/11:  BP= 128/82 today> denies fatigue, visual changes, CP, palipit, dizziness, syncope, dyspnea, edema, etc... ~  5/12:  BP= 140/90 & she notes chr daily HA, stress, etc... We decided to add VERAPAMIL SR 120mg /d (==> she stopped stating it caused HA). ~  11/12:  BP= 128/74 today & she denies CP, palpit, SOB,  edema... ~  7/13:  BP= 134/88 & she continues to dey CP, palpit, SOB, edema...  PERIPHERAL VASCULAR DISEASE (ICD-443.9) - CTChest in 2007 also showed ectasia of asc thor Ao... rec to take ASA 81mg /d... ~  CDopplers 12/08 at [facility] showed mild soft paque in CCAs bilat and 0-39% bilat ICA stenoses...  GERD & DUOD ULCER - on NEXIUM 40mg /d + Carafate liq...  ~  hx chronic PUD w/ gastric outlet obstruction in past... EGD 10/01  by DrDBrodie showed duod stricture and balloon dilatation performed... subseq surgery in 2003 by DrToth w/ duod stricture-plasty & vagotomy. ~  AbdSonar 3/12 showed distended GB, no stones, no ductal dil etc... ~  EGD 3/12 by DrBrodie showed +DU, remote pyloroplasty, gastritis (neg HPylori),candida esoph> treated w/ Diflucan, then incr PPI & Carafate... ~  7/13: she had f/u DrDBrodie> PUD & recurrent epig pain; pyloric channel ulcer 2012 EGD, neg HPylori; prev surg 2002 as above; switched to Nexium & added Carafate liquid  IRRITABLE BOWEL SYNDROME (ICD-564.1) - on LIBRAX Qid vs BENTYL 20mg  Qid... ~  last colon in EMR= 11/99 & neg/ WNL.Marland Kitchen. ~  CT Abd & Pelvis 9/03 showed wnl, NAD... ~  11/11: she notes incr IBS symptoms & we decided to switch Librax to BENTYL 20mg  every 4-6H as needed. ~  2/12:  GI f/u DrBrodie> notes reviewed...  LOW BACK PAIN SYNDROME (ICD-724.2)  HEADACHE (ICD-784.0) - followed for yrs in the [facility] now)... currently taking: ZOLOFT 100mg - 2tabsQam,  TOPAMAX 200mg - 2tabdQhs,  IMITREX 100mg Prn,  FLEXERIL 10mg - 2 QhsPrn... she tells me that [facility] requires that Rx for narcotics be filled by the pt's primary physician- she is requesting VICODIN ES 7.5mg ... ~  [facility] notes reviewed...  ANXIETY (ICD-300.00) - on KLONOPIN 0.5mg - 1/2-1 tabTid Prn... she wants Rx for #270 for mail order... I explained to her that she needs ROV Q65mo for monitoring & to get these perscriptions refilled...  VITAMIN D DEFICIENCY (ICD-268.9) - now on Vit D OTC 1000  u daily... ~  labs 5/09 showed Vit D level = 7... started Vit D 50000 u weekly. ~  labs 1/10 showed Vit D level = 67... OK to switch to 1000 u OTC daily...  ANEMIA - on MVI w/ Fe + Calcium & Vit D supplements... ~  Labs 5/12 showed Hg= 11.4   Past Surgical History  Procedure Date  . Tonsillectomy and adenoidectomy     as a child  . Tubal ligation 1984  . Abdominal hysterectomy   . Pud surgery w/duod sticture-plasty and vagotomy 2003    Dr. Carolynne Edouard    Pt DID NOT BRING MED BOTTLES TO THE OV AS REQUESTED... Outpatient Encounter Prescriptions as of 12/05/2012  Medication Sig Dispense Refill  . calcium-vitamin D (OSCAL 500/200 D-3) 500-200 MG-UNIT per tablet Take 1 tablet by mouth twice daily       . clidinium-chlordiazePOXIDE (LIBRAX) 2.5-5 MG per capsule Take 1 capsule by mouth 2 (two) times daily.      . clonazePAM (KLONOPIN) 0.5 MG tablet TAKE 1/2 TO 1 TABLET BY MOUTH 3 TIMES A DAY AS NEEDED FOR NERVES  270 tablet  0  . cyclobenzaprine (FLEXERIL) 10 MG tablet Take 2 tablets by mouth once daily as needed  60 tablet  5  . dicyclomine (BENTYL) 20 MG tablet Take 20 mg by mouth every 6 (six) hours. Take as needed for abdominal cramping       . EPINEPHrine (EPIPEN 2-PAK) 0.3 mg/0.3 mL DEVI Inject 0.3 mg into the muscle once.        Marland Kitchen esomeprazole (NEXIUM) 40 MG capsule Take 1 capsule (40 mg total) by mouth daily.  30 capsule  6  . felodipine (PLENDIL) 5 MG 24 hr tablet Take 1 tablet (5 mg total) by mouth daily.  30 tablet  5  . furosemide (LASIX) 20 MG tablet Take 1 tablet (20 mg total) by mouth daily.  90 tablet  3  . HYDROcodone-acetaminophen (VICODIN ES)  7.5-750 MG per tablet Take 1 tablet by mouth every 8 (eight) hours as needed.  90 tablet  5  . meclizine (ANTIVERT) 25 MG tablet Take 1 tablet by mouth every 6 hours as needed for dizziness  90 tablet  3  . mometasone (NASONEX) 50 MCG/ACT nasal spray Place 2 sprays into the nose daily.  17 g  11  . sertraline (ZOLOFT) 100 MG tablet 100 mg.  Take 2 tablets by mouth once daily       . sucralfate (CARAFATE) 1 GM/10ML suspension Take 10 mLs (1 g total) by mouth 4 (four) times daily.  800 mL  1  . SUMAtriptan (IMITREX) 100 MG tablet Take 100 mg by mouth every 2 (two) hours as needed.        . topiramate (TOPAMAX) 200 MG tablet Take 200 mg by mouth 2 (two) times daily.        . [DISCONTINUED] clidinium-chlordiazePOXIDE (LIBRAX) 2.5-5 MG per capsule Take 1 capsule by mouth 2 (two) times daily. Take 1 tablet by mouth twice daily  60 capsule  2  . [DISCONTINUED] pantoprazole (PROTONIX) 40 MG tablet TAKE 1 TABLET TWICE A DAY  180 tablet  0    Allergies  Allergen Reactions  . Amoxicillin     REACTION: hives  . Cephalexin     REACTION: hives  . Lisinopril     REACTION: ? hx of ACE cough...    Current Medications, Allergies, Past Medical History, Past Surgical History, Family History, and Social History were reviewed in [employer] record.    Review of Systems         See HPI - all other systems neg except as noted... The patient complains of dyspnea on exertion, abdominal pain, severe indigestion/heartburn, and muscle weakness.  The patient denies anorexia, fever, weight loss, weight gain, vision loss, decreased hearing, hoarseness, chest pain, syncope, peripheral edema, prolonged cough, headaches, hemoptysis, melena, hematochezia, hematuria, incontinence, suspicious skin lesions, transient blindness, difficulty walking, depression, unusual weight change, abnormal bleeding, enlarged lymph nodes, and angioedema.    Objective:   Physical Exam     WD, WN, 61 y/o BF in NAD... GENERAL:  Alert & oriented; pleasant & cooperative... HEENT:  /AT, EOM-wnl, PERRLA, EACs-clear, TMs-wnl, NOSE-clear, THROAT-clear & wnl, sl sinus tender on palp. NECK:  Supple w/ fairROM; no JVD; normal carotid impulses w/o bruits; no thyromegaly or nodules palpated; no lymphadenopathy. CHEST:  Clear to P & A; without wheezes/ rales/ or  rhonchi heard... HEART:  Regular Rhythm; without murmurs/ rubs/ or gallops. ABDOMEN:  Soft & nontender; normal bowel sounds; no organomegaly or masses detected. EXT: without deformities or arthritic changes; no varicose veins/ venous insuffic/ or edema. NEURO:  CN's intact;  no focal neuro deficits... DERM:  No lesions noted; no rash etc...  RADIOLOGY DATA:  Reviewed in the EPIC EMR & discussed w/ the patient...  LABORATORY DATA:  Reviewed in the EPIC EMR & discussed w/ the patient...   Assessment & Plan:    Hx Pulm Nodules>  Last CXR done 10/12 & no nodules seen...  HBP>  BP is fair on Plendil/ Lasix; We prev tried to add VERAPAMIL 120mg  daily for her BP & headaches but she stopped stating it CAUSED HAs...  GI>  Followed & managed by DrDBrodie w/ recent w/u reviewed w/ pt (DU & Gastritis)> on Nexcium/ Carafate, & Librax vs Bentyl for her IBS...  HEADACHES>  Followed & managed by DrHagen in the [facility]; pt inquired about Pain  Management & she is asked to take this up w/ DrHagen; we refilled Vicodin #90 as noted.  ANXIETY>  On Klonopin & this was refilled per request...  ANEMIA & Vit D defic>  She continues on supplements...   Patient's Medications  New Prescriptions   HYDROCODONE-ACETAMINOPHEN (VICODIN ES) 7.5-300 MG TABS    Take 1 tablet by mouth three times daily as needed   VITAMIN D, ERGOCALCIFEROL, (DRISDOL) 50000 UNITS CAPS    Take 1 capsule (50,000 Units total) by mouth every 7 (seven) days.  Previous Medications   CALCIUM-VITAMIN D (OSCAL 500/200 D-3) 500-200 MG-UNIT PER TABLET    Take 1 tablet by mouth twice daily    CYCLOBENZAPRINE (FLEXERIL) 10 MG TABLET    Take 2 tablets by mouth once daily as needed   DICYCLOMINE (BENTYL) 20 MG TABLET    Take 20 mg by mouth every 6 (six) hours. Take as needed for abdominal cramping    EPINEPHRINE (EPIPEN 2-PAK) 0.3 MG/0.3 ML DEVI    Inject 0.3 mg into the muscle once.     ESOMEPRAZOLE (NEXIUM) 40 MG CAPSULE    Take 1 capsule (40 mg  total) by mouth daily.   MECLIZINE (ANTIVERT) 25 MG TABLET    Take 1 tablet by mouth every 6 hours as needed for dizziness   MOMETASONE (NASONEX) 50 MCG/ACT NASAL SPRAY    Place 2 sprays into the nose daily.   SERTRALINE (ZOLOFT) 100 MG TABLET    100 mg. Take 2 tablets by mouth once daily    SUCRALFATE (CARAFATE) 1 GM/10ML SUSPENSION    Take 10 mLs (1 g total) by mouth 4 (four) times daily.   SUMATRIPTAN (IMITREX) 100 MG TABLET    Take 100 mg by mouth every 2 (two) hours as needed.     TOPIRAMATE (TOPAMAX) 200 MG TABLET    Take 200 mg by mouth 2 (two) times daily.    Modified Medications   Modified Medication Previous Medication   CLIDINIUM-CHLORDIAZEPOXIDE (LIBRAX) 2.5-5 MG PER CAPSULE clidinium-chlordiazePOXIDE (LIBRAX) 2.5-5 MG per capsule      Take 1 capsule by mouth 2 (two) times daily.    Take 1 capsule by mouth 2 (two) times daily. Take 1 tablet by mouth twice daily   CLONAZEPAM (KLONOPIN) 0.5 MG TABLET clonazePAM (KLONOPIN) 0.5 MG tablet      Take 1/2 to 1 tablet by mouth 3 times daily as needed for nerves    TAKE 1/2 TO 1 TABLET BY MOUTH 3 TIMES A DAY AS NEEDED FOR NERVES   FELODIPINE (PLENDIL) 5 MG 24 HR TABLET felodipine (PLENDIL) 5 MG 24 hr tablet      Take 1 tablet (5 mg total) by mouth daily.    Take 1 tablet (5 mg total) by mouth daily.   FUROSEMIDE (LASIX) 20 MG TABLET furosemide (LASIX) 20 MG tablet      Take 1 tablet (20 mg total) by mouth daily.    Take 1 tablet (20 mg total) by mouth daily.  Discontinued Medications   HYDROCODONE-ACETAMINOPHEN (VICODIN ES) 7.5-750 MG PER TABLET    Take 1 tablet by mouth every 8 (eight) hours as needed.   PANTOPRAZOLE (PROTONIX) 40 MG TABLET    TAKE 1 TABLET TWICE A DAY

## 2012-12-05 NOTE — Patient Instructions (Addendum)
Today we updated your med list in our EPIC system...    Continue your current medications the same...    We refilled the meds you requested...  Please return to our lab on morning soon for your FASTING blood work & we will also recheck your urine...    We will contact you w/ the results when avail...  Call for any problems...  Let's plan a routine follow up again in 6 months.Marland KitchenMarland Kitchen

## 2012-12-06 ENCOUNTER — Other Ambulatory Visit (INDEPENDENT_AMBULATORY_CARE_PROVIDER_SITE_OTHER): Payer: BC Managed Care – PPO

## 2012-12-06 DIAGNOSIS — E7800 Pure hypercholesterolemia, unspecified: Secondary | ICD-10-CM

## 2012-12-06 DIAGNOSIS — E559 Vitamin D deficiency, unspecified: Secondary | ICD-10-CM

## 2012-12-06 DIAGNOSIS — K277 Chronic peptic ulcer, site unspecified, without hemorrhage or perforation: Secondary | ICD-10-CM

## 2012-12-06 DIAGNOSIS — F411 Generalized anxiety disorder: Secondary | ICD-10-CM

## 2012-12-06 DIAGNOSIS — K219 Gastro-esophageal reflux disease without esophagitis: Secondary | ICD-10-CM

## 2012-12-06 DIAGNOSIS — I10 Essential (primary) hypertension: Secondary | ICD-10-CM

## 2012-12-06 DIAGNOSIS — N390 Urinary tract infection, site not specified: Secondary | ICD-10-CM

## 2012-12-06 LAB — BASIC METABOLIC PANEL
BUN: 18 mg/dL (ref 6–23)
Chloride: 105 mEq/L (ref 96–112)
Glucose, Bld: 81 mg/dL (ref 70–99)
Potassium: 3.6 mEq/L (ref 3.5–5.1)
Sodium: 140 mEq/L (ref 135–145)

## 2012-12-06 LAB — CBC WITH DIFFERENTIAL/PLATELET
Basophils Absolute: 0.1 10*3/uL (ref 0.0–0.1)
Eosinophils Absolute: 0.1 10*3/uL (ref 0.0–0.7)
Eosinophils Relative: 3.3 % (ref 0.0–5.0)
Lymphs Abs: 1.7 10*3/uL (ref 0.7–4.0)
MCHC: 32.9 g/dL (ref 30.0–36.0)
MCV: 89.3 fl (ref 78.0–100.0)
Monocytes Absolute: 0.4 10*3/uL (ref 0.1–1.0)
Neutrophils Relative %: 31.2 % — ABNORMAL LOW (ref 43.0–77.0)
Platelets: 268 10*3/uL (ref 150.0–400.0)
RDW: 15.1 % — ABNORMAL HIGH (ref 11.5–14.6)
WBC: 3.3 10*3/uL — ABNORMAL LOW (ref 4.5–10.5)

## 2012-12-06 LAB — URINALYSIS, ROUTINE W REFLEX MICROSCOPIC
Bilirubin Urine: NEGATIVE
Ketones, ur: NEGATIVE
Total Protein, Urine: NEGATIVE
Urine Glucose: NEGATIVE
pH: 6.5 (ref 5.0–8.0)

## 2012-12-06 LAB — LIPID PANEL
Cholesterol: 174 mg/dL (ref 0–200)
LDL Cholesterol: 111 mg/dL — ABNORMAL HIGH (ref 0–99)

## 2012-12-06 LAB — HEPATIC FUNCTION PANEL
ALT: 16 U/L (ref 0–35)
AST: 26 U/L (ref 0–37)
Alkaline Phosphatase: 56 U/L (ref 39–117)
Total Bilirubin: 0.7 mg/dL (ref 0.3–1.2)

## 2012-12-06 LAB — TSH: TSH: 1.29 u[IU]/mL (ref 0.35–5.50)

## 2012-12-07 ENCOUNTER — Other Ambulatory Visit: Payer: Self-pay | Admitting: Pulmonary Disease

## 2012-12-07 MED ORDER — VITAMIN D (ERGOCALCIFEROL) 1.25 MG (50000 UNIT) PO CAPS: 50000.0000 [IU] | ORAL_CAPSULE | ORAL | Status: DC

## 2012-12-07 NOTE — Telephone Encounter (Signed)
rx for the vit d 50 k sent to the pharmacy

## 2012-12-08 LAB — URINE CULTURE: Colony Count: NO GROWTH

## 2013-01-16 ENCOUNTER — Ambulatory Visit (INDEPENDENT_AMBULATORY_CARE_PROVIDER_SITE_OTHER): Payer: BC Managed Care – PPO | Admitting: Internal Medicine

## 2013-01-16 ENCOUNTER — Encounter: Payer: Self-pay | Admitting: Internal Medicine

## 2013-01-16 VITALS — BP 130/82 | HR 78 | Ht 63.0 in | Wt 126.0 lb

## 2013-01-16 DIAGNOSIS — K267 Chronic duodenal ulcer without hemorrhage or perforation: Secondary | ICD-10-CM

## 2013-01-16 DIAGNOSIS — K269 Duodenal ulcer, unspecified as acute or chronic, without hemorrhage or perforation: Secondary | ICD-10-CM

## 2013-01-16 DIAGNOSIS — Z1211 Encounter for screening for malignant neoplasm of colon: Secondary | ICD-10-CM

## 2013-01-16 DIAGNOSIS — R1013 Epigastric pain: Secondary | ICD-10-CM

## 2013-01-16 MED ORDER — MOVIPREP 100 G PO SOLR: 1.0000 | Freq: Once | ORAL | Status: DC

## 2013-01-16 MED ORDER — CILIDINIUM-CHLORDIAZEPOXIDE 2.5-5 MG PO CAPS: 1.0000 | ORAL_CAPSULE | Freq: Two times a day (BID) | ORAL | Status: DC

## 2013-01-16 MED ORDER — OMEPRAZOLE 40 MG PO CPDR: 40.0000 mg | DELAYED_RELEASE_CAPSULE | Freq: Every day | ORAL | Status: DC

## 2013-01-16 NOTE — Patient Instructions (Addendum)
You have been scheduled for an endoscopy and colonoscopy with propofol. Please follow the written instructions given to you at your visit today. Please pick up your prep at the pharmacy within the next 1-3 days. If you use inhalers (even only as needed) or a CPAP machine, please bring them with you on the day of your procedure.  We have sent the following medications to your mail in pharmacy: Librax Omeprazole  Please purchase Colace over the counter. Take 1 capsule every other day.  CC: Dr Kriste Basque

## 2013-01-16 NOTE — Progress Notes (Signed)
Michaela Rios 11/20/1951 MRN [id number]  History of Present Illness:  This is a 62 year old African American female with chronic abdominal pain due to peptic ulcer disease. Her last appointment was in July 2013. She underwent a vagotomy and stricturoplasty in 2002 for duodenal outlet obstruction. She had a recurrent duodenal ulcer in 2004 and was hospitalized for pancreatitis. Her last upper endoscopy in March 2012 showed a pyloric channel ulcer. She has been on omeprazole 40 mg daily with reasonable control of the pain. She is due for a colonoscopy. Her last exam in 1999 was normal. She is complaining of lower abdominal discomfort, cramps. Her bowel movements are every 3-4 days. She denies rectal bleeding, but she has lost about 8 pounds since her last visit in July mostly because of decreased appetite. She is on multiple medications for headaches and anxiety. She needs a refill on her Librax and omeprazole.   Past Medical History  Diagnosis Date  . Pulmonary nodule   . Hypertension   . Peripheral vascular disease   . GERD (gastroesophageal reflux disease)   . IBS (irritable bowel syndrome)   . Low back pain syndrome   . Headache   . Anxiety   . Vitamin D deficiency   . Peptic ulcer disease   . Esophagitis   . MRSA (methicillin resistant Staphylococcus aureus) infection 04/17/12    left torso   Past Surgical History  Procedure Laterality Date  . Tonsillectomy and adenoidectomy      as a child  . Tubal ligation  1984  . Abdominal hysterectomy    . Pud surgery w/duod sticture-plasty and vagotomy  2003    Dr. Carolynne Rios    reports that she has never smoked. She has never used smokeless tobacco. She reports that  drinks alcohol. She reports that she does not use illicit drugs. family history includes Aneurysm in her father and Prostate cancer in her brother.  There is no history of Colon cancer. Allergies  Allergen Reactions  . Amoxicillin     REACTION: hives  . Cephalexin    REACTION: hives  . Lisinopril     REACTION: ? hx of ACE cough...        Review of Systems: Negative for heartburn dysphagia. Positive for weight loss  The remainder of the 10 point ROS is negative except as outlined in H&P   Physical Exam: General appearance  Well developed, in no distress. Eyes- non icteric. HEENT nontraumatic, normocephalic. Mouth no lesions, tongue papillated, no cheilosis. Neck supple without adenopathy, thyroid not enlarged, no carotid bruits, no JVD. Lungs Clear to auscultation bilaterally. Cor normal S1, normal S2, regular rhythm, no murmur,  quiet precordium. Abdomen: Soft diffusely tender mostly in right lower and middle quadrant. Tenderness in epigastrium. No distention. Normal active bowel sounds. Liver edge at costal margin. Rectal: Not done. Extremities no pedal edema. Skin no lesions. Neurological alert and oriented x 3. Psychological normal mood and affect.  Assessment and Plan:  Problem #1 chronic peptic ulcer disease with the history of gastric and duodenal ulcer. Patient is status post remote to vagotomy and stricturoplasty. We will refill her omeprazole and Librax. We will check her CBC iron studies and B12. We will also schedule an upper endoscopy for followup of pyloric channel ulcer.  Problem #2 colorectal screening. Her last colonoscopy was in 1999. Her lower abdominal pain may be related to functional constipation or due to diverticulosis or irritable bowel syndrome. We will schedule a followup colonoscopy. I have asked her to  start on Colace 100 mg every other day.   01/16/2013 Michaela Rios

## 2013-02-22 ENCOUNTER — Encounter: Payer: Self-pay | Admitting: Internal Medicine

## 2013-02-22 ENCOUNTER — Ambulatory Visit (AMBULATORY_SURGERY_CENTER): Payer: BC Managed Care – PPO | Admitting: Internal Medicine

## 2013-02-22 VITALS — BP 122/77 | HR 78 | Temp 97.3°F | Resp 18 | Ht 63.0 in | Wt 126.0 lb

## 2013-02-22 DIAGNOSIS — K269 Duodenal ulcer, unspecified as acute or chronic, without hemorrhage or perforation: Secondary | ICD-10-CM

## 2013-02-22 DIAGNOSIS — Z1211 Encounter for screening for malignant neoplasm of colon: Secondary | ICD-10-CM

## 2013-02-22 DIAGNOSIS — K259 Gastric ulcer, unspecified as acute or chronic, without hemorrhage or perforation: Secondary | ICD-10-CM

## 2013-02-22 DIAGNOSIS — K277 Chronic peptic ulcer, site unspecified, without hemorrhage or perforation: Secondary | ICD-10-CM

## 2013-02-22 DIAGNOSIS — R1013 Epigastric pain: Secondary | ICD-10-CM

## 2013-02-22 MED ORDER — SODIUM CHLORIDE 0.9 % IV SOLN: 500.0000 mL | INTRAVENOUS | Status: DC

## 2013-02-22 NOTE — Progress Notes (Signed)
1610 Dr.Broadie and Park Liter to place, exchange pediatric to regular bite block lip pinched in process.P.Kaislyn Gulas crna

## 2013-02-22 NOTE — Op Note (Signed)
[facility] [address] [place], [address]   ENDOSCOPY PROCEDURE REPORT  PATIENT: Michaela, Rios.  MR#: [id number] BIRTHDATE: [birth year] , 61  yrs. old GENDER: Female ENDOSCOPIST: Hart Carwin, MD REFERRED BY:  Alroy Dust, M.D. PROCEDURE DATE:  02/22/2013 PROCEDURE:  EGD w/ biopsy ASA CLASS:     Class II INDICATIONS:  hx PUD,,s/p vagotomy and pyloroplasty 2002,,DU in 2004,, Pyloric ulcer 02/2011. MEDICATIONS: MAC sedation, administered by CRNA and propofol (Diprivan) 50mg  IV TOPICAL ANESTHETIC: Cetacaine Spray  DESCRIPTION OF PROCEDURE: After the risks benefits and alternatives of the procedure were thoroughly explained, informed consent was obtained.  The [facility] [id number] [id number] endoscope was introduced through the mouth and advanced to the second portion of the duodenum. Without limitations.  The instrument was slowly withdrawn as the mucosa was fully examined.        STOMACH: A single non-bleeding linear, shallow and clean-based ulcer, ranging between 3-32mm in size, was found in the prepyloric region of the stomach.  Biopsies were taken at edge of the ulcer.   ESOPHAGUS: The mucosa of the esophagus appeared normal.  DUODENUM: The duodenal mucosa showed no abnormalities in the duodenal bulb.  Retroflexed views revealed no abnormalities. The scope was then withdrawn from the patient and the procedure completed.  COMPLICATIONS: There were no complications. ENDOSCOPIC IMPRESSION: 1.   Single non-bleeding ulcer, ranging between 3-9mm in size, was found in the prepyloric region of the stomach , biopsies taken, no obstruction 2.   The mucosa of the esophagus appeared normal 3.   The duodenal mucosa showed no abnormalities in the duodenal bulb  RECOMMENDATIONS: 1.  Await pathology results 2.  Continue PPI 3. continue antispasm medications 4. bland diet  REPEAT EXAM: no recall  eSigned:  Hart Carwin, MD 02/22/2013 9:29  AM   CC:  PATIENT NAME:  Michaela Rios. MR#: [id number]

## 2013-02-22 NOTE — Progress Notes (Signed)
Called to room to assist during endoscopic procedure.  Patient ID and intended procedure confirmed with present staff. Received instructions for my participation in the procedure from the performing physician.  

## 2013-02-22 NOTE — Progress Notes (Signed)
800- pt starts she is starting to have a migraine.  Lights turned off and curtain drawn

## 2013-02-22 NOTE — Progress Notes (Signed)
Report to pacu, rn, vss, bbs=clear 

## 2013-02-22 NOTE — Progress Notes (Signed)
Patient did not experience any of the following events: a burn prior to discharge; a fall within the facility; wrong site/side/patient/procedure/implant event; or a hospital transfer or hospital admission upon discharge from the facility. (G8907) Patient did not have preoperative order for IV antibiotic SSI prophylaxis. ([id number])  

## 2013-02-22 NOTE — Op Note (Signed)
[facility] [address] [place], [address]   COLONOSCOPY PROCEDURE REPORT  PATIENT: Michaela, Rios.  MR#: [id number] BIRTHDATE: [birth year] , 61  yrs. old GENDER: Female ENDOSCOPIST: Hart Carwin, MD REFERRED BY:  recall colonoscopy PROCEDURE DATE:  02/22/2013 PROCEDURE:   Colonoscopy, screening ASA CLASS:   Class II INDICATIONS:Average risk patient for colon cancer and last colonoscopy 1999 was normal. MEDICATIONS: MAC sedation, administered by CRNA and propofol (Diprivan) 300mg  IV  DESCRIPTION OF PROCEDURE:   After the risks and benefits and of the procedure were explained, informed consent was obtained.  A digital rectal exam revealed no abnormalities of the rectum.    The LB CF-[id number]  endoscope was introduced through the anus and advanced to the cecum, which was identified by both the appendix and ileocecal valve .  The quality of the prep was Moviprep fair , large amount of liquid in the lumen.  The instrument was then slowly withdrawn as the colon was fully examined.     COLON FINDINGS: A normal appearing cecum, ileocecal valve, and appendiceal orifice were identified.  The ascending, hepatic flexure, transverse, splenic flexure, descending, sigmoid colon and rectum appeared unremarkable.There was a scope induced trauma at 20 cm,  No polyps or cancers were seen.     Retroflexed views revealed no abnormalities.     The scope was then withdrawn from the patient and the procedure completed.  COMPLICATIONS: There were no complications. ENDOSCOPIC IMPRESSION: Normal colon submucosal hemorrhage due to scope induced ptrauma at 20 cm, not bleeding, see photos  RECOMMENDATIONS: High fiber diet   REPEAT EXAM: In 10 year(s)  for Colonoscopy.  cc:  _______________________________ eSignedHart Carwin, MD 02/22/2013 9:36 AM

## 2013-02-22 NOTE — Patient Instructions (Addendum)

## 2013-02-22 NOTE — Progress Notes (Signed)
Note Dr. Melina Fiddler decieded to proceed with colonoscopy first instead of egd, thus time began [phone] stopped and changed to colonoscopy starting at [phone]

## 2013-02-25 ENCOUNTER — Telehealth: Payer: Self-pay

## 2013-02-25 NOTE — Telephone Encounter (Signed)
Left a message at [phone] for the pt to call us back if any questions or concerns. Maw

## 2013-02-26 ENCOUNTER — Encounter: Payer: Self-pay | Admitting: Internal Medicine

## 2013-04-29 ENCOUNTER — Other Ambulatory Visit: Payer: Self-pay | Admitting: Gastroenterology

## 2013-05-02 ENCOUNTER — Telehealth: Payer: Self-pay | Admitting: Pulmonary Disease

## 2013-05-02 MED ORDER — CLONAZEPAM 0.5 MG PO TABS: 0.5000 mg | ORAL_TABLET | Freq: Two times a day (BID) | ORAL | Status: DC | PRN

## 2013-05-02 NOTE — Telephone Encounter (Signed)
Per SN---  Ok per SN for  klonopin 0.5 mg #20  1 po BID prn anxiety.  thanks

## 2013-05-02 NOTE — Telephone Encounter (Signed)
[facility] called [facility] [place] [facility] [place], [place] who verbalized understanding. Pt aware and voiced no further questions or concerns at this time.

## 2013-05-02 NOTE — Telephone Encounter (Signed)
I spoke with pt. She stated she is in [place] and she forgot her klonopin back at home. She is wanting 15 tabs called in for her where she is at. This was last refilled for her 12/05/12 #270 x 3 refills. Please advise SN thanks Last OV 12/05/12 Pending 06/05/13

## 2013-06-05 ENCOUNTER — Ambulatory Visit: Payer: BC Managed Care – PPO | Admitting: Pulmonary Disease

## 2013-06-10 ENCOUNTER — Telehealth: Payer: Self-pay | Admitting: Pulmonary Disease

## 2013-06-10 MED ORDER — CLONAZEPAM 0.5 MG PO TABS: ORAL_TABLET | ORAL | Status: DC

## 2013-06-10 NOTE — Telephone Encounter (Signed)
Pt states rx for hr clonazepam has expired and she is needing a refill. Pt has appt with SN on 06-21-13 for f/u, last OV 11-2012. Refill sent to last until appt. Carron Curie, CMA

## 2013-06-21 ENCOUNTER — Other Ambulatory Visit (INDEPENDENT_AMBULATORY_CARE_PROVIDER_SITE_OTHER): Payer: BC Managed Care – PPO

## 2013-06-21 ENCOUNTER — Ambulatory Visit (INDEPENDENT_AMBULATORY_CARE_PROVIDER_SITE_OTHER): Payer: BC Managed Care – PPO | Admitting: Pulmonary Disease

## 2013-06-21 ENCOUNTER — Encounter: Payer: Self-pay | Admitting: Pulmonary Disease

## 2013-06-21 VITALS — BP 146/98 | HR 110 | Temp 98.5°F | Ht 64.0 in | Wt 132.6 lb

## 2013-06-21 DIAGNOSIS — F411 Generalized anxiety disorder: Secondary | ICD-10-CM

## 2013-06-21 DIAGNOSIS — I10 Essential (primary) hypertension: Secondary | ICD-10-CM

## 2013-06-21 DIAGNOSIS — R51 Headache: Secondary | ICD-10-CM

## 2013-06-21 DIAGNOSIS — I739 Peripheral vascular disease, unspecified: Secondary | ICD-10-CM

## 2013-06-21 DIAGNOSIS — M545 Low back pain, unspecified: Secondary | ICD-10-CM

## 2013-06-21 DIAGNOSIS — K277 Chronic peptic ulcer, site unspecified, without hemorrhage or perforation: Secondary | ICD-10-CM

## 2013-06-21 DIAGNOSIS — R300 Dysuria: Secondary | ICD-10-CM

## 2013-06-21 DIAGNOSIS — Z23 Encounter for immunization: Secondary | ICD-10-CM

## 2013-06-21 DIAGNOSIS — K589 Irritable bowel syndrome without diarrhea: Secondary | ICD-10-CM

## 2013-06-21 DIAGNOSIS — K219 Gastro-esophageal reflux disease without esophagitis: Secondary | ICD-10-CM

## 2013-06-21 LAB — URINALYSIS, ROUTINE W REFLEX MICROSCOPIC
Bilirubin Urine: NEGATIVE
Nitrite: POSITIVE

## 2013-06-21 MED ORDER — HYDROCODONE-ACETAMINOPHEN 7.5-300 MG PO TABS: ORAL_TABLET | ORAL | Status: DC

## 2013-06-21 MED ORDER — FUROSEMIDE 20 MG PO TABS: 20.0000 mg | ORAL_TABLET | Freq: Every day | ORAL | Status: DC

## 2013-06-21 MED ORDER — METOPROLOL SUCCINATE ER 50 MG PO TB24: 50.0000 mg | ORAL_TABLET | Freq: Every day | ORAL | Status: DC

## 2013-06-21 MED ORDER — FELODIPINE ER 5 MG PO TB24: 5.0000 mg | ORAL_TABLET | Freq: Every day | ORAL | Status: DC

## 2013-06-21 MED ORDER — MECLIZINE HCL 25 MG PO TABS: ORAL_TABLET | ORAL | Status: DC

## 2013-06-21 MED ORDER — CIPROFLOXACIN HCL 250 MG PO TABS: 250.0000 mg | ORAL_TABLET | Freq: Two times a day (BID) | ORAL | Status: DC

## 2013-06-21 MED ORDER — VITAMIN D (ERGOCALCIFEROL) 1.25 MG (50000 UNIT) PO CAPS: 50000.0000 [IU] | ORAL_CAPSULE | ORAL | Status: DC

## 2013-06-21 MED ORDER — MOMETASONE FUROATE 50 MCG/ACT NA SUSP: 2.0000 | Freq: Every day | NASAL | Status: DC

## 2013-06-21 MED ORDER — ALPRAZOLAM 0.5 MG PO TABS: 0.5000 mg | ORAL_TABLET | Freq: Three times a day (TID) | ORAL | Status: DC | PRN

## 2013-06-21 NOTE — Progress Notes (Signed)
Subjective:    Patient ID: Michaela Rios, female    DOB: 12-04-1950, 62 y.o.   MRN: [id number]  HPI 62 y/o BF here for a follow up visit... she has multiple medical problems as noted below...    ~  October 25, 2011:  24mo ROV & she saw TP 10/12 for bronchitis & treated w/ Levaquin, Mucinex, Hydromet> improved;  She also had f/u appt DrHagen in the [facility]- note reviewed- Dx as chronic migraines, tried on mult preventives, Botox didn't help, felt to suffer from medication overuse but she doesn't get it & reluctant to make changes> on Verap180, Topamax200Bid, Imitrex100, Zoloft200/d, Zanaflex4Bid, Flexeril10Tid, VicodinES7.5prn... Pt is reminded to bring all her med bottles to all her office visits.    She requests referral back to DrESL for allergy eval;  BP well controlled on her Lasix Rx (note- DrHagen tried to incr Verap to 180mg /d);  GI appears stable on her Protonix , Carafate, Librax & Bentyl.  ~  May 29, 2012:  79mo ROV & Brileigh reports continued difficulties w/ HAs & GI issues> she had an episode of MRSA on her left flank area 5/13- went to an [facility] & had it lanced w/ culture pos for MRSA she tells me; treated w/ Ab shots and pills (she doesn't know the names & didin't bring her list or bottles); it has totally resolved now...    She has freq migraines and is limited to #27 Imitrex Q51mo by her insurance & I asked her to check w/ [facility]; last Neuro note from DrHagen was 11/12 & she is reminded to f/u w/ the [facility]; on Topamax, Klonopin, Zoloft regularly- and Flexeril, Imitrex, & VicodinES as needed...    She also c/o increased GI symptoms & has f/u DrDBrodie soon; last seen 3/12 as noted below> hx GERD, DU, IBS & she has Protonix, Librax & Bentyl to use as needed...    She saw [facility]- "DrPeriwinkle" she said & repeat allergy testing showed reactions to> feathers, dust mites, weeds, cockroach, trees, molds; she was also allergic to shellfish and she notes reactions to peanuts;  she has an Epipen for prn use...    We reviewed prob list, meds, xrays and labs> see below>> LABS 7/13:  She will ret for fasting blood work- pending...  ~  December 05, 2012:  24mo ROV & [name] has a generally pos ROS noting a virus over the holidays, went to [facility] & told bladder infection; she got sick after 3d Cipro therefore stopped it & wants f/u UA today; c/o no energy, trouble sleeping, incr stress w/ brother who has ca; etc... We reviewed the following medical problems during today's office visit>>     Hx pulm nodule> hx several sm RML/RLL 4-70mm nodules w/o change on prev CT scans; breathing is good- min cough w/o sput, no hemptysis, denies SOB; she has not been active & needs exercise program...    HBP> on Plendil5, Lasix20; BP= 134/90 after rest; she denies CP, palpit, SOB, edema, etc; hx ACE cough...    ASPVD> supposed to be on ASA81; hx ectasia of Ascending Thor Ao & soft plaque in CCAs bilat on CDopplers...    GI- GERD, DU, IBS> on Nexium, Carafate, Librax/ Bentyl; managed by DrDBrodie & her notes are reviewed...    GU- recent UTI> as above; f/u UA today is clear, no resid infection...    LBP, VitD defic> on Flexeril prn, Calcium, MVI; she is managing satis...     HAs>  on VicodinES-7.5mg  prn, Imitrex100, Topamax200Bid per [facility]; we do not have any recent notes from them...    Anxiety> on Klonopin0.5Tid prn, Zoloft100; she does satis w/ these meds...    Anemia> Hg in the 11-12 range & stable... We reviewed prob list, meds, xrays and labs> see below for updates >>  LABS 1/14:  FLP- ok on diet w/ LDL=111;  Chems- wnl;  CBC- ok w/ Hg=11.9;  TSH=1.29;  VitD=13 & she needs 50K supplement weekly;  UA- microheme, no infection.    ~  June 21, 2013:   48mo ROV & [name] has been under incr stress- her Orlando Penner ZOX09 died 2wks ago from small cell lung cancer- she is seeing a "therapist" & on Klonopin vs Alprazolam (she prefers the latter) and they changed her Zoloft to Cymbalta60... Her CC today  is dysuria- we will check UA and C&S, start CIPRO in the interim...    BP treated w/ Plendil5 & Lasix20; BP= 140/90 & she has HAs but no CP, palpit, SOB, edema; we decided to add METOP-ER 50mg /d...    She has GERD & hx DU w/ f/u 2/14 by DrDBrodie due to her chr abd pain> on Omep40, repeat EGD 3/14= one ulcer seen (neg HPylori), no ch in Rx but she may want to try stronger PPI- Dexilant; she also had Colonoscopy= wnl, f/u planned 10 yrs...    She is followed by DrCHagen for HAs- seen in her [place] clinic on Imitrex, & she says that we write for her Vicodin7.5 and we will add the ToprolXL50 today... We reviewed prob list, meds, xrays and labs> see below for updates >>            Problem List:  ALLERGIC RHINITIS (ICD-477.9) - uses OTC antihistamines as needed.... seen by DrLawrence & DrSJacobs for ENT in the past... also seen by DrESL in 2005 w/ pos reactions to shellfish, dust, trees, grass, weeds... she wants epipen to keep on hand.  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 ~  CXR 7/10 showed Clear & NAD (mild tort Ao). ~  CXR 5/12:  Pt was requested to get f/u film today but she forgot to go to [facility]!  ~  CXR 10/12 showed clear lungs, no nodules seen, tortuous Ao, mild scoliosis...  HYPERTENSION (ICD-401.9) - hx ACE cough in past... Prev controlled on LASIX 20mg /d...  ~  [facility] 12/[facility] showed norm LVF, mild MR, no source for emboli etc... ~  11/11:  BP= 128/82 today> denies fatigue, visual changes, CP, palipit, dizziness, syncope, dyspnea, edema, etc... ~  5/12:  BP= 140/90 & she notes chr daily HA, stress, etc... We decided to add VERAPAMIL SR 120mg /d (==> she stopped stating it caused HA). ~  11/12:  BP= 128/74 today & she denies CP, palpit, SOB, edema... ~  7/13:  BP= 134/88 & she continues to deny CP, palpit, SOB, edema... ~  1/14: on Plendil5,  Lasix20; BP= 134/90 after rest; she denies CP, palpit, SOB, edema, etc; hx ACE cough... ~  7/14:  BP treated w/ Plendil5 & Lasix20; BP= 140/90 & she has HAs but no CP, palpit, SOB, edema; we decided to add METOP-ER 50mg /d.  PERIPHERAL VASCULAR DISEASE (ICD-443.9) - CTChest in 2007 also showed ectasia of asc thor Ao... rec to take ASA 81mg /d... ~  CDopplers 12/08 at [facility] showed mild soft paque  in CCAs bilat and 0-39% bilat ICA stenoses...  GERD & DUOD ULCER >> ~  hx chronic PUD w/ gastric outlet obstruction in past... 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 subseq surgery in 2003 by DrToth w/ duod stricture-plasty & vagotomy. ~  AbdSonar 3/12 showed distended GB, no stones, no ductal dil etc... ~  EGD 3/12 by DrBrodie showed +DU, remote pyloroplasty, gastritis (neg HPylori),candida esoph> treated w/ Diflucan, then incr PPI & Carafate... ~  7/13: she had f/u DrDBrodie> PUD & recurrent epig pain; pyloric channel ulcer 2012 EGD, neg HPylori; prev surg 2002 as above; switched to Nexium & added Carafate liquid ~  7/14: She had f/u 2/14 by DrDBrodie due to her chr abd pain> on Omep40, repeat EGD 3/14= one ulcer seen (neg HPylori), no ch in Rx but she may want to try stronger PPI- Dexilant...  IRRITABLE BOWEL SYNDROME (ICD-564.1) - on LIBRAX Qid vs BENTYL 20mg  Qid... ~  last colon in EMR= 11/99 & neg/ WNL.Marland Kitchen. ~  CT Abd & Pelvis 9/03 showed wnl, NAD... ~  11/11: she notes incr IBS symptoms & we decided to switch Librax to BENTYL 20mg  every 4-6H as needed. ~  2/12:  GI f/u DrBrodie> notes reviewed... ~  3/14: she had f/u colonoscopy by DrDBrodie> neg, wnl, f/u planned 10 yrs...  LOW BACK PAIN SYNDROME (ICD-724.2)  HEADACHE (ICD-784.0) - followed for yrs in the [facility] now)... Prev  taking: ZOLOFT 100mg - 2tabsQam,  TOPAMAX 200mg - 2tabdQhs,  IMITREX 100mg Prn,  FLEXERIL 10mg - 2 QhsPrn... she tells me that [facility] requires that Rx for narcotics be  filled by the pt's primary physician- she is requesting VICODIN ES 7.5mg ... ~  [facility] notes reviewed... ~  7/14: she informs me that she is still seeing DrHagen at he [place] office now- on Imitrex...  ANXIETY (ICD-300.00) - on KLONOPIN 0.5mg - 1/2-1 tabTid Prn... she wants Rx for #270 for mail order... I explained to her that she needs ROV Q50mo for monitoring & to get these perscriptions refilled... ~  7/14: she tells me she prefers ALPRAZOLAM 0.5mg  Tid & we will write for this & stop her Klonopin med...  VITAMIN D DEFICIENCY (ICD-268.9) - now on Vit D OTC 1000 u daily... ~  labs 5/09 showed Vit D level = 7... started Vit D 50000 u weekly. ~  labs 1/10 showed Vit D level = 67... OK to switch to 1000 u OTC daily...  ANEMIA - on MVI w/ Fe + Calcium & Vit D supplements... ~  Labs 5/12 showed Hg= 11.4   Past Surgical History  Procedure Laterality Date  . Tonsillectomy and adenoidectomy      as a child  . Tubal ligation  1984  . Abdominal hysterectomy    . Pud surgery w/duod sticture-plasty and vagotomy  2003    Dr. Carolynne Edouard  . Laparoscopic bilateral salpingo oopherectomy      Pt DID NOT BRING MED BOTTLES TO THE OV AS REQUESTED... Outpatient Encounter Prescriptions as of 06/21/2013  Medication Sig Dispense Refill  . calcium-vitamin D (OSCAL 500/200 D-3) 500-200 MG-UNIT per tablet Take 1 tablet by mouth twice daily       . clidinium-chlordiazePOXIDE (LIBRAX) 2.5-5 MG per capsule Take 1 capsule by mouth 2 (two) times daily.  180 capsule  2  . clonazePAM (KLONOPIN) 0.5 MG tablet Take 1/2 to 1 tablet by mouth 3 times daily as needed for nerves  30 tablet  0  . dicyclomine (BENTYL) 20  MG tablet Take 20 mg by mouth every 6 (six) hours. Take as needed for abdominal cramping       . EPINEPHrine (EPIPEN 2-PAK) 0.3 mg/0.3 mL DEVI Inject 0.3 mg into the muscle once.        . felodipine (PLENDIL) 5 MG 24 hr tablet Take 1 tablet (5 mg total) by mouth daily.  90 tablet  3  . furosemide (LASIX) 20 MG  tablet Take 1 tablet (20 mg total) by mouth daily.  90 tablet  3  . Hydrocodone-Acetaminophen (VICODIN ES) 7.5-300 MG TABS Take 1 tablet by mouth three times daily as needed  90 each  5  . meclizine (ANTIVERT) 25 MG tablet Take 1 tablet by mouth every 6 hours as needed for dizziness  90 tablet  3  . mometasone (NASONEX) 50 MCG/ACT nasal spray Place 2 sprays into the nose daily.  17 g  11  . omeprazole (PRILOSEC) 40 MG capsule Take 1 capsule (40 mg total) by mouth daily.  90 capsule  2  . sertraline (ZOLOFT) 100 MG tablet 100 mg. Take 2 tablets by mouth once daily       . SUMAtriptan (IMITREX) 100 MG tablet Take 100 mg by mouth every 2 (two) hours as needed.        . Vitamin D, Ergocalciferol, (DRISDOL) 50000 UNITS CAPS Take 1 capsule (50,000 Units total) by mouth every 7 (seven) days.  12 capsule  3   No facility-administered encounter medications on file as of 06/21/2013.    Allergies  Allergen Reactions  . Amoxicillin     REACTION: hives  . Cephalexin     REACTION: hives  . Lisinopril     REACTION: ? hx of ACE cough...    Current Medications, Allergies, Past Medical History, Past Surgical History, Family History, and Social History were reviewed in [employer] record.    Review of Systems         See HPI - all other systems neg except as noted... The patient complains of dyspnea on exertion, abdominal pain, severe indigestion/heartburn, and muscle weakness.  The patient denies anorexia, fever, weight loss, weight gain, vision loss, decreased hearing, hoarseness, chest pain, syncope, peripheral edema, prolonged cough, headaches, hemoptysis, melena, hematochezia, hematuria, incontinence, suspicious skin lesions, transient blindness, difficulty walking, depression, unusual weight change, abnormal bleeding, enlarged lymph nodes, and angioedema.    Objective:   Physical Exam     WD, WN, 61 y/o BF in NAD... GENERAL:  Alert & oriented; pleasant &  cooperative... HEENT:  [place]/AT, EOM-wnl, PERRLA, EACs-clear, TMs-wnl, NOSE-clear, THROAT-clear & wnl, sl sinus tender on palp. NECK:  Supple w/ fairROM; no JVD; normal carotid impulses w/o bruits; no thyromegaly or nodules palpated; no lymphadenopathy. CHEST:  Clear to P & A; without wheezes/ rales/ or rhonchi heard... HEART:  Regular Rhythm; without murmurs/ rubs/ or gallops. ABDOMEN:  Soft & nontender; normal bowel sounds; no organomegaly or masses detected. EXT: without deformities or arthritic changes; no varicose veins/ venous insuffic/ or edema. NEURO:  CN's intact;  no focal neuro deficits... DERM:  No lesions noted; no rash etc...  RADIOLOGY DATA:  Reviewed in the EPIC EMR & discussed w/ the patient...  LABORATORY DATA:  Reviewed in the EPIC EMR & discussed w/ the patient...   Assessment & Plan:  UTI> prelim data on her UA is c/w cystitis 7 we will treat w/ Cipro...  Hx Pulm Nodules>  Last CXR done 10/12 & no nodules seen...  HBP>  BP  is fair on Plendil/ Lasix; We prev tried to add Verap120 for her BP & headaches but she stopped stating it CAUSED HAs; let's try ToprolXL50...  GI>  Followed & managed by DrDBrodie w/ recent w/u reviewed w/ pt (DU & Gastritis)> on Omep40, & Librax vs Bentyl for her IBS...  HEADACHES>  Followed & managed by DrHagen in the [facility]; pt inquired about Pain Management & she is asked to take this up w/ DrHagen; we refilled Vicodin #90 as noted.  ANXIETY>  On Klonopin & this was changed to Alprazolam...  ANEMIA & Vit D defic>  She continues on supplements...   Patient's Medications  New Prescriptions   ALPRAZOLAM (XANAX) 0.5 MG TABLET    Take 1 tablet (0.5 mg total) by mouth 3 (three) times daily as needed for anxiety.   CIPROFLOXACIN (CIPRO) 250 MG TABLET    Take 1 tablet (250 mg total) by mouth 2 (two) times daily.   METOPROLOL SUCCINATE (TOPROL-XL) 50 MG 24 HR TABLET    Take 1 tablet (50 mg total) by mouth daily. Take with or immediately following  a meal.  Previous Medications   CALCIUM-VITAMIN D (OSCAL 500/200 D-3) 500-200 MG-UNIT PER TABLET    Take 1 tablet by mouth twice daily    CLIDINIUM-CHLORDIAZEPOXIDE (LIBRAX) 2.5-5 MG PER CAPSULE    Take 1 capsule by mouth 2 (two) times daily.   DICYCLOMINE (BENTYL) 20 MG TABLET    Take 20 mg by mouth every 6 (six) hours. Take as needed for abdominal cramping    DULOXETINE (CYMBALTA) 60 MG CAPSULE    Take 60 mg by mouth daily.   EPINEPHRINE (EPIPEN 2-PAK) 0.3 MG/0.3 ML DEVI    Inject 0.3 mg into the muscle once.     OMEPRAZOLE (PRILOSEC) 40 MG CAPSULE    Take 1 capsule (40 mg total) by mouth daily.   SUMATRIPTAN (IMITREX) 100 MG TABLET    Take 100 mg by mouth every 2 (two) hours as needed.    Modified Medications   Modified Medication Previous Medication   FELODIPINE (PLENDIL) 5 MG 24 HR TABLET felodipine (PLENDIL) 5 MG 24 hr tablet      Take 1 tablet (5 mg total) by mouth daily.    Take 1 tablet (5 mg total) by mouth daily.   FUROSEMIDE (LASIX) 20 MG TABLET furosemide (LASIX) 20 MG tablet      Take 1 tablet (20 mg total) by mouth daily.    Take 1 tablet (20 mg total) by mouth daily.   HYDROCODONE-ACETAMINOPHEN (VICODIN ES) 7.5-300 MG TABS Hydrocodone-Acetaminophen (VICODIN ES) 7.5-300 MG TABS      Take 1 tablet by mouth three times daily as needed    Take 1 tablet by mouth three times daily as needed   MECLIZINE (ANTIVERT) 25 MG TABLET meclizine (ANTIVERT) 25 MG tablet      Take 1 tablet by mouth every 6 hours as needed for dizziness    Take 1 tablet by mouth every 6 hours as needed for dizziness   MOMETASONE (NASONEX) 50 MCG/ACT NASAL SPRAY mometasone (NASONEX) 50 MCG/ACT nasal spray      Place 2 sprays into the nose daily.    Place 2 sprays into the nose daily.   VITAMIN D, ERGOCALCIFEROL, (DRISDOL) 50000 UNITS CAPS Vitamin D, Ergocalciferol, (DRISDOL) 50000 UNITS CAPS      Take 1 capsule (50,000 Units total) by mouth every 7 (seven) days.    Take 1 capsule (50,000 Units total) by mouth every  7 (  seven) days.  Discontinued Medications   CLONAZEPAM (KLONOPIN) 0.5 MG TABLET    Take 1/2 to 1 tablet by mouth 3 times daily as needed for nerves   SERTRALINE (ZOLOFT) 100 MG TABLET    100 mg. Take 2 tablets by mouth once daily

## 2013-06-21 NOTE — Patient Instructions (Addendum)
Today we updated your med list in our EPIC system...    Continue your current medications the same...    We refilled the meds you requested...  For your BP & palpit>>    We decided to add METOPROLOL-ER 50mg  one tab daily...   For the stomach acid>>    Try the Dexilant 60mg  one tab daily- taken 30 min before the 1st meal of the day...  Keep in contact w/ your therapist & DrDBrodie for your GI symptoms...  Let's plan a follow up visit in 6 months w/ FASTING blood work at that time.Marland KitchenMarland Kitchen

## 2013-06-25 ENCOUNTER — Telehealth: Payer: Self-pay | Admitting: *Deleted

## 2013-06-25 NOTE — Telephone Encounter (Signed)
Called and spoke with pt and she is aware that per SN she should cont the cipro until gone.  Pt voiced her understanding of these results.    Pt stated that at her last ov with SN---she was given rx for the alprazolam.  Pt stated that she had been on the klonopin 0.5 mg  And would like to get this refilled through the [facility] pharmacy that she has been using.  Pt stated that she has been on this medication for a long while and would like to stay on this and not fill the alprazolam.  SN please advise if this is ok.  Looks like from phone note on 05/02/13 we did refill this for her.    Allergies  Allergen Reactions  . Amoxicillin     REACTION: hives  . Cephalexin     REACTION: hives  . Lisinopril     REACTION: ? hx of ACE cough.Marland KitchenMarland Kitchen

## 2013-06-26 MED ORDER — CLONAZEPAM 0.5 MG PO TABS: 0.5000 mg | ORAL_TABLET | Freq: Two times a day (BID) | ORAL | Status: DC | PRN

## 2013-06-26 NOTE — Telephone Encounter (Signed)
Pt is aware of this information. Nothing further was needed.

## 2013-06-26 NOTE — Telephone Encounter (Signed)
Per SN---  Ok to throw out hte rx for the alprazolam.   Ok to refill the klonopin  0.5mg   1 po bid  #60 or #180---pt stated that she wants this send in to the mail order.  This rx has been sent to her mail order. thanks

## 2013-07-05 ENCOUNTER — Other Ambulatory Visit: Payer: Self-pay | Admitting: Gastroenterology

## 2013-07-31 ENCOUNTER — Other Ambulatory Visit: Payer: Self-pay | Admitting: Gastroenterology

## 2013-08-28 ENCOUNTER — Telehealth: Payer: Self-pay | Admitting: Pulmonary Disease

## 2013-08-28 NOTE — Telephone Encounter (Signed)
LMTCB x1 for pt.  

## 2013-08-28 NOTE — Telephone Encounter (Signed)
Pt called back. She stated she is wanting to know if SN will increase her clonopin 0.5 mg 1 po BID. She stated she is needing a stronger dose. She stated her brother passed in July and she is having trouble coping and with her anxiety. Please advise SN thanks Last OV 06/21/13 P[facility]/13 [facility] Allergies  Allergen Reactions  . Amoxicillin     REACTION: hives  . Cephalexin     REACTION: hives  . Lisinopril     REACTION: ? hx of ACE cough.Marland KitchenMarland Kitchen

## 2013-08-29 MED ORDER — CLONAZEPAM 1 MG PO TABS: 1.0000 mg | ORAL_TABLET | Freq: Two times a day (BID) | ORAL | Status: DC | PRN

## 2013-08-29 NOTE — Telephone Encounter (Signed)
Per SN--  Ok to increase the klonopin 1 mg po bid  #60  With 5 refills.  thanks

## 2013-08-29 NOTE — Telephone Encounter (Signed)
I have spoke with the pharmacy and gave VO for pt increase dose.  I called pt and made her aware. Nothing further needed

## 2013-09-26 ENCOUNTER — Encounter: Payer: Self-pay | Admitting: *Deleted

## 2013-09-27 ENCOUNTER — Other Ambulatory Visit: Payer: Self-pay | Admitting: Internal Medicine

## 2013-09-30 ENCOUNTER — Telehealth: Payer: Self-pay | Admitting: Pulmonary Disease

## 2013-09-30 NOTE — Telephone Encounter (Signed)
Called, spoke with pt.  She tried to get hydrocod[facility]  [facility] but was [facility] couldn't [facility].  She is questing why.  Advised of the new law.  She verbalized understanding.  Pt is requesting a rx to be mailed to her verified home address.   Hydrocodone 7.5/300 rx was last written by our office on 06/21/13 # 90 x 5.   [facility], spoke with Misty Stanley.  Was advised they did disregard rxs according to new law.  Pt last filled this medication on 08/24/13 # 90. Dr. Kriste Basque, pls advise if rx is ok.  Thank you.

## 2013-10-01 MED ORDER — HYDROCODONE-ACETAMINOPHEN 7.5-300 MG PO TABS: ORAL_TABLET | ORAL | Status: DC

## 2013-10-01 NOTE — Telephone Encounter (Signed)
lmomtcb for pt 

## 2013-10-01 NOTE — Telephone Encounter (Signed)
Returning call can be reached at [phone].Michaela Rios

## 2013-10-01 NOTE — Telephone Encounter (Signed)
Per SN--  The new guidelines from [employer] care have changed the schedule of this medication.  The require that this medication not be refilled and pt will have to come to the office each time this medication needs to be refilled.  Ok to refill.  rx has been printed out and will leave up front for the pt to pick up.

## 2013-10-01 NOTE — Telephone Encounter (Signed)
I spoke with pt and she will pcik up RX. Nothing further needed

## 2013-10-03 ENCOUNTER — Other Ambulatory Visit: Payer: Self-pay

## 2013-10-14 ENCOUNTER — Telehealth: Payer: Self-pay | Admitting: Pulmonary Disease

## 2013-10-14 NOTE — Telephone Encounter (Signed)
There was an Rx for clonazepam sent on 08-29-13 with 5 refills. [facility]. I advised the pt and she states she will go by there and get it. Carron Curie, CMA

## 2013-10-21 ENCOUNTER — Telehealth: Payer: Self-pay | Admitting: Internal Medicine

## 2013-10-21 ENCOUNTER — Ambulatory Visit: Payer: BC Managed Care – PPO | Admitting: Internal Medicine

## 2013-10-21 NOTE — Telephone Encounter (Signed)
Do not charge  

## 2013-10-28 ENCOUNTER — Telehealth: Payer: Self-pay | Admitting: Pulmonary Disease

## 2013-10-28 NOTE — Telephone Encounter (Signed)
lmomtcb x1 

## 2013-10-29 MED ORDER — HYDROCODONE-ACETAMINOPHEN 7.5-300 MG PO TABS: ORAL_TABLET | ORAL | Status: DC

## 2013-10-29 MED ORDER — SUMATRIPTAN SUCCINATE 100 MG PO TABS: 100.0000 mg | ORAL_TABLET | ORAL | Status: DC | PRN

## 2013-10-29 NOTE — Telephone Encounter (Signed)
Per SN---ok to refill these meds for the pt.   i have printed off these rx and placed on SN cart to be signed.  Will leave up front for the pt to pick up once done.  Let the pt know that she can pick these up today after 3 pm.  thanks

## 2013-10-29 NOTE — Telephone Encounter (Signed)
I called and made pt aware. Nothing further needed 

## 2013-10-29 NOTE — Telephone Encounter (Signed)
I spoke with the pt and she is asking for a refill on hydrocodone, last rx on 10-01-13 for #90. Pt is aware this is 2 days early but she is leaving for a trip on [phone] and she will need to take this medication with her. She is also asking for an rx for imitrex to be refilled. She states Dr. Kriste Basque does not write this but the doctor that does is unavailable before she leaves for her trip. Pt uses these medications for migraines. Please advise if ok to refill both. Pt aware she has to come get RX.  Thanks.Carron Curie, CMA Allergies  Allergen Reactions  . Amoxicillin     REACTION: hives  . Cephalexin     REACTION: hives  . Lisinopril     REACTION: ? hx of ACE cough.Marland KitchenMarland Kitchen

## 2013-12-06 ENCOUNTER — Ambulatory Visit: Payer: BC Managed Care – PPO | Admitting: Internal Medicine

## 2013-12-09 ENCOUNTER — Telehealth: Payer: Self-pay | Admitting: Pulmonary Disease

## 2013-12-09 NOTE — Telephone Encounter (Signed)
lmomtcb x1 for pt 

## 2013-12-10 NOTE — Telephone Encounter (Signed)
Called and spoke with pt and she stated that her insurance changed in jan 2015.  She stated that this insurance will not let her use her old mail order company.  She has appt with SN on 12/30/13 and will get a new rx for the clonazepam for her new mail order company.  Would like to have a 30 day supply called to local pharmacy  [facility].  Pt is aware that this will be called in for her. [facility] and the pt has a refill and they will get this ready for her.

## 2013-12-30 ENCOUNTER — Ambulatory Visit (INDEPENDENT_AMBULATORY_CARE_PROVIDER_SITE_OTHER): Payer: 59 | Admitting: Pulmonary Disease

## 2013-12-30 ENCOUNTER — Encounter: Payer: Self-pay | Admitting: Pulmonary Disease

## 2013-12-30 VITALS — BP 140/90 | HR 72 | Temp 97.9°F | Ht 63.5 in | Wt 136.8 lb

## 2013-12-30 DIAGNOSIS — M545 Low back pain, unspecified: Secondary | ICD-10-CM

## 2013-12-30 DIAGNOSIS — E7800 Pure hypercholesterolemia, unspecified: Secondary | ICD-10-CM

## 2013-12-30 DIAGNOSIS — R51 Headache: Secondary | ICD-10-CM

## 2013-12-30 DIAGNOSIS — K277 Chronic peptic ulcer, site unspecified, without hemorrhage or perforation: Secondary | ICD-10-CM

## 2013-12-30 DIAGNOSIS — K589 Irritable bowel syndrome without diarrhea: Secondary | ICD-10-CM

## 2013-12-30 DIAGNOSIS — M1990 Unspecified osteoarthritis, unspecified site: Secondary | ICD-10-CM

## 2013-12-30 DIAGNOSIS — F411 Generalized anxiety disorder: Secondary | ICD-10-CM

## 2013-12-30 DIAGNOSIS — I10 Essential (primary) hypertension: Secondary | ICD-10-CM

## 2013-12-30 DIAGNOSIS — K219 Gastro-esophageal reflux disease without esophagitis: Secondary | ICD-10-CM

## 2013-12-30 DIAGNOSIS — I739 Peripheral vascular disease, unspecified: Secondary | ICD-10-CM

## 2013-12-30 DIAGNOSIS — Z862 Personal history of diseases of the blood and blood-forming organs and certain disorders involving the immune mechanism: Secondary | ICD-10-CM

## 2013-12-30 MED ORDER — SUMATRIPTAN SUCCINATE 100 MG PO TABS: 100.0000 mg | ORAL_TABLET | ORAL | Status: DC | PRN

## 2013-12-30 MED ORDER — MECLIZINE HCL 25 MG PO TABS: ORAL_TABLET | ORAL | Status: DC

## 2013-12-30 MED ORDER — FUROSEMIDE 20 MG PO TABS: 20.0000 mg | ORAL_TABLET | Freq: Every day | ORAL | Status: DC

## 2013-12-30 MED ORDER — FELODIPINE ER 5 MG PO TB24: 5.0000 mg | ORAL_TABLET | Freq: Every day | ORAL | Status: DC

## 2013-12-30 MED ORDER — HYDROCODONE-ACETAMINOPHEN 7.5-300 MG PO TABS: ORAL_TABLET | ORAL | Status: DC

## 2013-12-30 MED ORDER — MOMETASONE FUROATE 50 MCG/ACT NA SUSP: 2.0000 | Freq: Every day | NASAL | Status: DC

## 2013-12-30 MED ORDER — CILIDINIUM-CHLORDIAZEPOXIDE 2.5-5 MG PO CAPS: ORAL_CAPSULE | ORAL | Status: DC

## 2013-12-30 MED ORDER — METOPROLOL SUCCINATE ER 50 MG PO TB24: 50.0000 mg | ORAL_TABLET | Freq: Every day | ORAL | Status: DC

## 2013-12-30 MED ORDER — OMEPRAZOLE 40 MG PO CPDR: DELAYED_RELEASE_CAPSULE | ORAL | Status: DC

## 2013-12-30 MED ORDER — ALPRAZOLAM 0.5 MG PO TABS: 0.5000 mg | ORAL_TABLET | Freq: Three times a day (TID) | ORAL | Status: DC | PRN

## 2013-12-30 MED ORDER — VITAMIN D (ERGOCALCIFEROL) 1.25 MG (50000 UNIT) PO CAPS: 50000.0000 [IU] | ORAL_CAPSULE | ORAL | Status: DC

## 2013-12-30 NOTE — Patient Instructions (Signed)
Today we updated your med list in our EPIC system...    Continue your current medications the same...  Today we added METOPROLOL-ER 50mg  take one tab daily...  We also changed your nerve pill from Klonopin to ALPRAZOLAM 0.5mg - take 1 tab up to 3 times daily as needed for anxiety...  Today we did your follow up CXR & FASTING blood work...    We will contact you w/ the results when available...   You should call drHagen regarding your chronic daily Headaches to see if there is anything else she can do to decrease the freq & severity...  Call for any questions...  Let's plan a follow up visit in 75mo, sooner if needed for problems.Marland KitchenMarland Kitchen

## 2013-12-30 NOTE — Progress Notes (Signed)
Subjective:    Patient ID: Michaela Rios, female    DOB: 11-21-1951, 63 y.o.   MRN: [id number]  HPI 63 y/o BF here for a follow up visit... she has multiple medical problems as noted below...    ~  May 29, 2012:  77moROV & JFelecityreports continued difficulties w/ HAs & GI issues> she had an episode of MRSA on her left flank area 5/13- [facility] culture pos for MRSA she tells me; treated w/ Ab shots and pills (she doesn't know the names & didin't bring her list or bottles); it has totally resolved now...    She has freq migraines and is limited to #27 Imitrex Q375moy her insurance & I asked her to check w/ Michaela Rios; last Neuro note from Michaela Rios 11/12 & she is reminded to f/u w/ the [facility]; on Topamax, Klonopin, Zoloft regularly- and Flexeril, Imitrex, & VicodinES as needed...    She also c/o increased GI symptoms & has f/u Michaela Rios soon; last seen 3/12 as noted below> hx GERD, DU, IBS & she has Protonix, Librax & Bentyl to use as needed...    She saw [facility]- "[facility]ios" she said & repeat allergy testing showed reactions to> feathers, dust mites, weeds, cockroach, trees, molds; she was also allergic to shellfish and she notes reactions to peanuts; she has an Epipen for prn use...    We reviewed prob list, meds, xrays and labs> see below>> LABS 7/13:  She will ret for fasting blood work- pending...  ~  December 05, 2012:  82m52moV & Michaela Rios a generally pos ROS noting a virus over the holidays, [facility] she got sick after 3d Cipro therefore stopped it & wants f/u UA today; c/o no energy, trouble sleeping, incr stress w/ brother who has ca; etc... We reviewed the following medical problems during today's office visit>>     Hx pulm nodule> hx several sm RML/RLL 4-5mm74mdules w/o change on prev CT scans; breathing is good- min cough w/o sput, no hemptysis, denies SOB; she has not been active & needs exercise program...    HBP>  on Plendil5, Lasix20; BP= 134/90 after rest; she denies CP, palpit, SOB, edema, etc; hx ACE cough...    ASPVD> supposed to be on ASA81; hx ectasia of Ascending Thor Ao & soft plaque in CCAs bilat on CDopplers...    GI- GERD, DU, IBS> on Nexium, Carafate, Librax/ Bentyl; managed by Michaela Rios & her notes are reviewed...    GU- recent UTI> as above; f/u UA today is clear, no resid infection...    LBP, VitD defic> on Flexeril prn, Calcium, MVI; she is managing satis...     HAs> on VicodinES-7.5mg 79m, Imitrex100, [facility]ax200Bid [facility]...    Anxiety> on Klonopin0.5Tid prn, Zoloft100; she does satis w/ these meds...    Anemia> Hg in the 11-12 range & stable... We reviewed prob list, meds, xrays and labs> see below for updates >>  LABS 1/14:  FLP- ok on diet w/ LDL=111;  Chems- wnl;  CBC- ok w/ Hg=11.9;  TSH=1.29;  VitD=13 & she needs 50K supplement weekly;  UA- microheme, no infection.    ~  June 21, 2013:   82mo R12mo Michaela Rios under incr stress- her Bro agArtis Rios [id number] ago from small cell lung cancer- she is seeing a "therapist" &  on Klonopin vs Alprazolam (she prefers the latter) and they changed her Zoloft to Michaela Rios... Her CC today is dysuria- we will check UA and C&S, start CIPRO in the interim...    BP treated w/ Plendil5 & Lasix20; BP= 140/90 & she has HAs but no CP, palpit, SOB, edema; we decided to add METOP-ER 37m/d...    She has GERD & hx DU w/ f/u 2/14 by Michaela Rios due to her chr abd pain> on Omep40, repeat EGD 3/14= one ulcer seen (neg HPylori), no ch in Rx but she may want to try stronger PPI- Dexilant; she also had Colonoscopy= wnl, f/u planned 10 yrs...    She is followed by Michaela Rios for HAs- seen in her [facility] on Imitrex, & she says that we write for her Vicodin7.5 and we will add the ToprolXL50 today... We reviewed prob list, meds, xrays and labs> see below for updates >>   ~  December 30, 2013:  616moOV & Michaela Rios that she eats ice all the time & wants her iron checked; she has a hx of chronic headaches, followed by Michaela Rios [facility] w/ recent exac;  We reviewed the following medical problems during today's office visit >>     Hx pulm nodule> hx several sm RML/RLL 4-17m76modules w/o change on prev CT scans; breathing is good- min cough w/o sput, no hemptysis, denies SOB; she has not been active & needs exercise program...    HBP> on Plendil5, Lasix20; last OV we added MetopER50 but she stopped on her own; BP= 142/100 after rest; she denies CP, palpit, SOB, edema, etc; hx ACE cough; Rec to restart the ToprolXL50...    ASPVD> supposed to be on ASA81; hx ectasia of Ascending Thor Ao & soft plaque in CCAs bilat on CDopplers...    GI- GERD, DU, IBS> on Protonix40, Librax/ Bentyl; managed by Michaela Rios & her notes are reviewed...    GU- hxUTI> as above; no voiding symptoms or recurrent UTIs...    LBP, VitD defic> on Flexeril prn, Calcium, MVI; she is managing satis...     HAs> on VicodinES-7.17mg10mn, Imitrex100, [facility]amax200Bid [facility] not [facility] any recent [facility] from them; pt states "nothing helps my HAs" & she's had trigger shots, botox, pain management...    Anxiety> on Klonopin0.5Tid prn, Cymbalta60; she does satis w/ these meds, followed by Michaela Rios...    Anemia> Hg in the 11-12 range & stable... We reviewed prob list, meds, xrays and labs> see below for updates >>  CXR 2/15 => pending LABS 2/15 => pending  (NOTE: pt did not go to lab for requested CXR & Fasting bl;ood work today)           Problem List:  ALLERGIC RHINITIS (ICD-477.9) - uses OTC antihistamines as needed.... seen by Michaela Rios & Michaela Rios for ENT in the past... also seen by Michaela Rios in 2005 w/ pos reactions to shellfish, dust, trees, grass, weeds... she wants epipen to keep on hand.  Hx of PULMONARY NODULE (ICD-518.89) - prev CTChest in 2005, 2006, & 2007 w/ several small 4-17mm 73mht middle and lower lobe nodules without  change, felt to be benign... ~  CXR 7/10 showed Clear & NAD (mild tort Ao). ~  CXR 5/12:  Pt was requested to get f/u film today but she forgot to go to [facility] CXR 10/12 showed clear lungs, no nodules seen, tortuous Ao, mild scoliosis... ~  CXR 2/15 => pending  HYPERTENSION (ICD-401.9) -  hx ACE cough in past... Prev controlled on LASIX 86m/d...  ~  [facility], mild MR, no source for emboli etc... ~  11/11:  BP= 128/82 today> denies fatigue, visual changes, CP, palipit, dizziness, syncope, dyspnea, edema, etc... ~  5/12:  BP= 140/90 & she notes chr daily HA, stress, etc... We decided to add VERAPAMIL SR 1262md (==> she stopped stating it caused HA). ~  11/12:  BP= 128/74 today & she denies CP, palpit, SOB, edema... ~  7/13:  BP= 134/88 & she continues to deny CP, palpit, SOB, edema... ~  1/14: on Plendil5, Lasix20; BP= 134/90 after rest; she denies CP, palpit, SOB, edema, etc; hx ACE cough... ~  7/14:  BP treated w/ Plendil5 & Lasix20; BP= 140/90 & she has HAs but no CP, palpit, SOB, edema; we decided to add METOP-ER 5030m. ~  2/15: on Plendil5, Lasix20; last OV we added MetopER50 but she stopped on her own; BP= 142/100 after rest; she denies CP, palpit, SOB, edema, etc; hx ACE cough; Rec to restart the ToprolXL50.  PERIPHERAL VASCULAR DISEASE (ICD-443.9) - CTChest in 2007 also showed ectasia of asc thor Ao... rec to take ASA 35m9m.. ~  CDopplers 12/[facility] at [facility] showed mild soft paque in CCAs bilat and 0-39% bilat ICA stenoses...  GERD & DUOD ULCER >> ~  hx chronic PUD w/ gastric outlet obstruction in past... EGD 10/01 by Michaela Rios showed duod stricture and balloon dilatation performed... subseq surgery in 2003 by DrToth w/ duod stricture-plasty & vagotomy. ~  AbdSonar 3/12 showed distended GB, no stones, no ductal dil etc... ~  EGD 3/12 by DrBrodie showed +DU, remote pyloroplasty, gastritis (neg HPylori),candida esoph> treated w/ Diflucan, then incr PPI &  Carafate... ~  7/13: she had f/u Michaela Rios> PUD & recurrent epig pain; pyloric channel ulcer 2012 EGD, neg HPylori; prev surg 2002 as above; switched to Nexium & added Carafate liquid ~  7/14: She had f/u 2/14 by Michaela Rios due to her chr abd pain> on Omep40, repeat EGD 3/14= one ulcer seen (neg HPylori), no ch in Rx but she may want to try stronger PPI- Dexilant...  IRRITABLE BOWEL SYNDROME (ICD-564.1) - on LIBRAX Qid vs BENTYL 20mg16m... ~  last colon in EMR= 11/99 & neg/ WNL... ~ Marland KitchenCT Abd & Pelvis 9/03 showed wnl, NAD... ~  11/11: she notes incr IBS symptoms & we decided to switch Librax to BENTYL 20mg 56my 4-6H as needed. ~  2/12:  GI f/u DrBrodie> notes reviewed... ~  3/14: she had f/u colonoscopy by Michaela Rios> neg, wnl, f/u planned 10 yrs...  LOW BACK PAIN SYNDROME (ICD-724.2)  HEADACHE (ICD-784.0) - [facility] Prev  taking: ZOLOFT 100mg- 52msQam,  TOPAMAX 200mg- 251mQhs,  IMITREX 100mgPrn,58mEXERIL 10mg- 2 Q25mn... she [facility] me [facility] pt's primary physician- she is requesting VICODIN ES 7.5mg... ~  47mClinic notes reviewed... ~  7/14: she informs me that she [place] [facility] on Imitrex... ~  2/15: on VicodinES-7.5mg prn, Im10mex100, Topamax200Bid per [facility]; pt states "nothing helps my HAs" & she's had trigger shots, botox, pain management.  ANXIETY (ICD-300.00) - on KLONOPIN 0.5mg- 1/2-1 t33mid Prn... she wants Rx for #270 for mail order... I explained to her that she needs ROV Q6mo for monit34mog & to  get these perscriptions refilled... ~  7/14: she tells me she prefers ALPRAZOLAM 0.23m Tid & we will write for this & stop her Klonopin med...  VITAMIN D DEFICIENCY (ICD-268.9) - now on Vit D OTC 1000 u daily... ~  labs 5/09 showed Vit D level = 7... started Vit D 50000 u weekly. ~  labs 1/10  showed Vit D level = 67... OK to switch to 1000 u OTC daily...  ANEMIA - on MVI w/ Fe + Calcium & Vit D supplements... ~  Labs 5/12 showed Hg= 11.4   Past Surgical History  Procedure Laterality Date  . Tonsillectomy and adenoidectomy      as a child  . Tubal ligation  1984  . Abdominal hysterectomy    . Pud surgery w/duod sticture-plasty and vagotomy  2003    Dr. TMarlou Starks . Laparoscopic bilateral salpingo oopherectomy      Pt DID NOT BRING MED BOTTLES TO THE OV AS REQUESTED... Outpatient Encounter Prescriptions as of 12/30/2013  Medication Sig  . calcium-vitamin D (OSCAL 500/200 D-3) 500-200 MG-UNIT per tablet Take 1 tablet by mouth twice daily   . clidinium-chlordiazePOXIDE (LIBRAX) 5-2.5 MG per capsule TAKE 1 CAPSULE TWICE A DAY  . clonazePAM (KLONOPIN) 1 MG tablet Take 1 tablet (1 mg total) by mouth 2 (two) times daily as needed for anxiety.  . dicyclomine (BENTYL) 20 MG tablet Take 20 mg by mouth every 6 (six) hours. Take as needed for abdominal cramping   . DULoxetine (CYMBALTA) 60 MG capsule Take 60 mg by mouth daily.  .Marland KitchenEPINEPHrine (EPIPEN 2-PAK) 0.3 mg/0.3 mL DEVI Inject 0.3 mg into the muscle once.    . felodipine (PLENDIL) 5 MG 24 hr tablet Take 1 tablet (5 mg total) by mouth daily.  . furosemide (LASIX) 20 MG tablet Take 1 tablet (20 mg total) by mouth daily.  . Hydrocodone-Acetaminophen (VICODIN ES) 7.5-300 MG TABS Take 1 tablet by mouth three times daily as needed  . meclizine (ANTIVERT) 25 MG tablet Take 1 tablet by mouth every 6 hours as needed for dizziness  . mometasone (NASONEX) 50 MCG/ACT nasal spray Place 2 sprays into the nose daily.  .Marland Kitchenomeprazole (PRILOSEC) 40 MG capsule TAKE 1 CAPSULE DAILY  . SUMAtriptan (IMITREX) 100 MG tablet Take 1 tablet (100 mg total) by mouth every 2 (two) hours as needed.  . Vitamin D, Ergocalciferol, (DRISDOL) 50000 UNITS CAPS Take 1 capsule (50,000 Units total) by mouth every 7 (seven) days.  . [DISCONTINUED] ciprofloxacin (CIPRO) 250 MG  tablet Take 1 tablet (250 mg total) by mouth 2 (two) times daily.  . [DISCONTINUED] metoprolol succinate (TOPROL-XL) 50 MG 24 hr tablet Take 1 tablet (50 mg total) by mouth daily. Take with or immediately following a meal.    Allergies  Allergen Reactions  . Amoxicillin     REACTION: hives  . Cephalexin     REACTION: hives  . Lisinopril     REACTION: ? hx of ACE cough...    Current Medications, Allergies, Past Medical History, Past Surgical History, Family History, and Social History were reviewed in [employer]record.    Review of Systems         See HPI - all other systems neg except as noted... The patient complains of dyspnea on exertion, abdominal pain, severe indigestion/heartburn, and muscle weakness.  The patient denies anorexia, fever, weight loss, weight gain, vision loss, decreased hearing, hoarseness, chest pain, syncope, peripheral edema, prolonged cough, headaches, hemoptysis, melena, hematochezia, hematuria,  incontinence, suspicious skin lesions, transient blindness, difficulty walking, depression, unusual weight change, abnormal bleeding, enlarged lymph nodes, and angioedema.    Objective:   Physical Exam     WD, WN, 63 y/o BF in NAD... GENERAL:  Alert & oriented; pleasant & cooperative... HEENT:  /AT, EOM-wnl, PERRLA, EACs-clear, TMs-wnl, NOSE-clear, THROAT-clear & wnl, sl sinus tender on palp. NECK:  Supple w/ fairROM; no JVD; normal carotid impulses w/o bruits; no thyromegaly or nodules palpated; no lymphadenopathy. CHEST:  Clear to P & A; without wheezes/ rales/ or rhonchi heard... HEART:  Regular Rhythm; without murmurs/ rubs/ or gallops. ABDOMEN:  Soft & nontender; normal bowel sounds; no organomegaly or masses detected. EXT: without deformities or arthritic changes; no varicose veins/ venous insuffic/ or edema. NEURO:  CN's intact;  no focal neuro deficits... DERM:  No lesions noted; no rash etc...  RADIOLOGY DATA:  Reviewed in the  EPIC EMR & discussed w/ the patient...  LABORATORY DATA:  Reviewed in the EPIC EMR & discussed w/ the patient...   Assessment & Plan:    Hx Pulm Nodules>  Last CXR done 10/12 & no nodules seen...  HBP>  BP is fair on Plendil/ Lasix; We prev tried to add [id number] for her BP & headaches but she stopped stating it CAUSED HAs; let's try ToprolXL50...  GI>  Followed & managed by Michaela Rios w/ recent w/u reviewed w/ pt (DU & Gastritis)> on Omep40, & Librax vs Bentyl for her IBS...  HEADACHES>  Followed & managed by Michaela Rios in the [facility]; pt inquired about Pain Management & she is asked to take this up w/ Michaela Rios; we refilled Vicodin #90 as noted.  ANXIETY>  On Klonopin & this was changed to Alprazolam...  ANEMIA & Vit D defic>  She continues on supplements.Marland KitchenMarland Kitchen

## 2014-01-09 ENCOUNTER — Telehealth: Payer: Self-pay | Admitting: Pulmonary Disease

## 2014-01-10 MED ORDER — HYDROCODONE-ACETAMINOPHEN 7.5-325 MG PO TABS: 1.0000 | ORAL_TABLET | Freq: Three times a day (TID) | ORAL | Status: DC | PRN

## 2014-01-10 NOTE — Telephone Encounter (Signed)
Called and spoke with pt and she is aware that the rx is up front and ready to be picked up.  Pt stated that she has a pending appt with Dr. Asa Lente in June 16.

## 2014-01-10 NOTE — Telephone Encounter (Signed)
Pt calling in ref to previous can be reached at [phone].Michaela Rios

## 2014-01-10 NOTE — Telephone Encounter (Signed)
Called and spoke with pt. Confirmed message. She reports her insurance will cover 7.5-325 mg norco. Please advise SN thanks

## 2014-01-10 NOTE — Telephone Encounter (Signed)
Per SN---  Ok for the norco 7.5/325  1 po TID prn severe pain #90.    Remind her she will need new primary care doctor going forward once SN retires from primary care in April.  rx has been printed out and placed on SN cart and will call the pt once this has been signed and is ready to be picked up.

## 2014-01-21 ENCOUNTER — Ambulatory Visit: Payer: BC Managed Care – PPO | Admitting: Internal Medicine

## 2014-02-26 ENCOUNTER — Ambulatory Visit: Payer: 59 | Admitting: Internal Medicine

## 2014-04-02 ENCOUNTER — Encounter: Payer: Self-pay | Admitting: Pulmonary Disease

## 2014-04-10 ENCOUNTER — Ambulatory Visit: Payer: 59 | Admitting: Internal Medicine

## 2014-05-14 ENCOUNTER — Encounter: Payer: Self-pay | Admitting: *Deleted

## 2014-05-14 ENCOUNTER — Ambulatory Visit (INDEPENDENT_AMBULATORY_CARE_PROVIDER_SITE_OTHER): Payer: 59 | Admitting: Internal Medicine

## 2014-05-14 ENCOUNTER — Encounter: Payer: Self-pay | Admitting: Internal Medicine

## 2014-05-14 VITALS — BP 140/90 | HR 84 | Temp 98.1°F | Ht 63.5 in | Wt 132.4 lb

## 2014-05-14 DIAGNOSIS — G43709 Chronic migraine without aura, not intractable, without status migrainosus: Secondary | ICD-10-CM

## 2014-05-14 DIAGNOSIS — K277 Chronic peptic ulcer, site unspecified, without hemorrhage or perforation: Secondary | ICD-10-CM

## 2014-05-14 DIAGNOSIS — Z0000 Encounter for general adult medical examination without abnormal findings: Secondary | ICD-10-CM

## 2014-05-14 DIAGNOSIS — I10 Essential (primary) hypertension: Secondary | ICD-10-CM

## 2014-05-14 DIAGNOSIS — IMO0002 Reserved for concepts with insufficient information to code with codable children: Secondary | ICD-10-CM

## 2014-05-14 MED ORDER — FELODIPINE ER 5 MG PO TB24: 5.0000 mg | ORAL_TABLET | Freq: Every day | ORAL | Status: DC

## 2014-05-14 MED ORDER — MECLIZINE HCL 25 MG PO TABS: ORAL_TABLET | ORAL | Status: DC

## 2014-05-14 MED ORDER — FUROSEMIDE 20 MG PO TABS: 20.0000 mg | ORAL_TABLET | Freq: Every day | ORAL | Status: DC

## 2014-05-14 MED ORDER — ALPRAZOLAM 0.5 MG PO TABS: 0.5000 mg | ORAL_TABLET | Freq: Three times a day (TID) | ORAL | Status: DC | PRN

## 2014-05-14 MED ORDER — METOPROLOL SUCCINATE ER 50 MG PO TB24: 50.0000 mg | ORAL_TABLET | Freq: Every day | ORAL | Status: DC

## 2014-05-14 MED ORDER — DULOXETINE HCL 60 MG PO CPEP: 60.0000 mg | ORAL_CAPSULE | Freq: Every day | ORAL | Status: DC

## 2014-05-14 MED ORDER — HYDROCODONE-ACETAMINOPHEN 7.5-325 MG PO TABS: 1.0000 | ORAL_TABLET | Freq: Three times a day (TID) | ORAL | Status: DC | PRN

## 2014-05-14 MED ORDER — TOPIRAMATE 50 MG PO TABS: 50.0000 mg | ORAL_TABLET | Freq: Two times a day (BID) | ORAL | Status: DC

## 2014-05-14 MED ORDER — ESTRADIOL 0.025 MG/24HR TD PTTW: 1.0000 | MEDICATED_PATCH | TRANSDERMAL | Status: DC

## 2014-05-14 MED ORDER — SUMATRIPTAN SUCCINATE 100 MG PO TABS: 100.0000 mg | ORAL_TABLET | ORAL | Status: DC | PRN

## 2014-05-14 MED ORDER — OMEPRAZOLE 40 MG PO CPDR: DELAYED_RELEASE_CAPSULE | ORAL | Status: DC

## 2014-05-14 NOTE — Patient Instructions (Signed)
It was good to see you today.  We have reviewed your prior records including labs and tests today  Health Maintenance reviewed - all recommended immunizations and age-appropriate screenings are up-to-date.  Return when you are fasting for labs ordered 12/2013. Your results will be released to [place] (or called to you) after review, usually within 72hours after test completion. If any changes need to be made, you will be notified at that same time.  Medications reviewed and updated, no changes recommended at this time. Refill on medication(s) as discussed today.  Please schedule followup in 6 months for semiannual exam and labs, call sooner if problems.

## 2014-05-14 NOTE — Assessment & Plan Note (Signed)
BP Readings from Last 3 Encounters:  05/14/14 140/90  12/30/13 140/90  06/21/13 146/98   The current medical regimen is effective;  continue present plan and medications.

## 2014-05-14 NOTE — Progress Notes (Signed)
Subjective:    Patient ID: Michaela Rios, female    DOB: 19-Dec-1950, 63 y.o.   MRN: [id number]  HPI  New patient to me, here to establish with PCP after retirement of Lenna Gilford   Here for medicare wellness  Diet: heart healthy  Physical activity: sedentary Depression/mood screen: negative Hearing: intact to whispered voice Visual acuity: grossly normal, performs annual eye exam  ADLs: capable Fall risk: none Home safety: good Cognitive evaluation: intact to orientation, naming, recall and repetition EOL planning: adv directives, full code/ I agree  I have personally reviewed and have noted 1. The patient's medical and social history 2. Their use of alcohol, tobacco or illicit drugs 3. Their current medications and supplements 4. The patient's functional ability including ADL's, fall risks, home safety risks and hearing or visual impairment. 5. Diet and physical activities 6. Evidence for depression or mood disorders  Also reviewed chronic medical issues and events  Past Medical History  Diagnosis Date  . Hypertension   . Peripheral vascular disease   . GERD (gastroesophageal reflux disease)   . IBS (irritable bowel syndrome)   . Low back pain syndrome   . Chronic migraine     follows with neuro for same  . Anxiety   . Vitamin D deficiency   . Peptic ulcer disease 2003, 12/2012  . Esophagitis   . MRSA (methicillin resistant Staphylococcus aureus) infection 04/17/12    left torso  . Anemia   . Arthritis   . Cataract   . Clotting disorder     clot in ovarian vein- hx   Family History  Problem Relation Age of Onset  . Aneurysm Father   . Prostate cancer Brother   . Colon cancer Neg Hx   . Esophageal cancer Neg Hx   . Rectal cancer Neg Hx   . Stomach cancer Neg Hx   . Lung cancer Brother    History  Substance Use Topics  . Smoking status: Never Smoker   . Smokeless tobacco: Never Used  . Alcohol Use: Yes     Comment: rare   Review of Systems    Constitutional: Positive for fatigue. Negative for unexpected weight change.  Respiratory: Negative for cough, shortness of breath and wheezing.   Cardiovascular: Negative for chest pain, palpitations and leg swelling.  Gastrointestinal: Negative for nausea, abdominal pain and diarrhea.  Musculoskeletal: Positive for arthralgias and myalgias.  Neurological: Positive for headaches (chronic, daily). Negative for dizziness, weakness and light-headedness.  Psychiatric/Behavioral: Negative for dysphoric mood. The patient is not nervous/anxious.   All other systems reviewed and are negative.      Objective:   Physical Exam  BP 140/90  Pulse 84  Temp(Src) 98.1 F (36.7 C) (Oral)  Ht 5' 3.5" (1.613 m)  Wt 132 lb 6.4 oz (60.056 kg)  BMI 23.08 kg/m2  SpO2 99% Wt Readings from Last 3 Encounters:  05/14/14 132 lb 6.4 oz (60.056 kg)  12/30/13 136 lb 12.8 oz (62.052 kg)  06/21/13 132 lb 9.6 oz (60.147 kg)    Constitutional: She appears well-developed and well-nourished. No distress.  Neck: Normal range of motion. Neck supple. No JVD present. No thyromegaly present.  Cardiovascular: Normal rate, regular rhythm and normal heart sounds.  No murmur heard. No BLE edema. Pulmonary/Chest: Effort normal and breath sounds normal. No respiratory distress. She has no wheezes.  Psychiatric: She has a normal mood and affect. Her behavior is normal. Judgment and thought content normal.   Lab Results  Component Value  Date   WBC 3.3* 12/06/2012   HGB 11.9* 12/06/2012   HCT 36.2 12/06/2012   PLT 268.0 12/06/2012   GLUCOSE 81 12/06/2012   CHOL 174 12/06/2012   TRIG 117.0 12/06/2012   HDL 39.20 12/06/2012   LDLCALC 111* 12/06/2012   ALT 16 12/06/2012   AST 26 12/06/2012   NA 140 12/06/2012   K 3.6 12/06/2012   CL 105 12/06/2012   CREATININE 0.9 12/06/2012   BUN 18 12/06/2012   CO2 27 12/06/2012   TSH 1.29 12/06/2012    Dg Chest 2 View  09/26/2011   *RADIOLOGY REPORT*  Clinical Data: Nonsmoker with cough for 1 month.   Hypertension. Pulmonary nodule.  CHEST - 2 VIEW  Comparison: Chest radiographs 06/08/2009 and CT 12/03/2004.  Findings: The heart size and mediastinal contours are stable with aortic tortuosity.  The lungs appear clear.  No pulmonary nodules are identified.  There is no pleural effusion.  Osseous structures appear stable with mild thoracolumbar scoliosis.  IMPRESSION: Stable examination.  No acute cardiopulmonary process or pulmonary nodule demonstrated.  Original Report Authenticated By: Vivia Ewing, M.D.      Assessment & Plan:   AWV/v70.0 - Today patient counseled on age appropriate routine health concerns for screening and prevention, each reviewed and up to date or declined. Immunizations reviewed and up to date or declined. Labs reviewed. Risk factors for depression reviewed and negative. Hearing function and visual acuity are intact. ADLs screened and addressed as needed. Functional ability and level of safety reviewed and appropriate. Education, counseling and referrals performed based on assessed risks today. Patient provided with a copy of personalized plan for preventive services.  Problem List Items Addressed This Visit   Chronic migraine     follows with neurologist for same -Hagen On Cymbalta, hydrocodone, and Imitrex as needed Receiving narcotics from PCP since spring 2015 following change of neurologist practice to [facility] Reports chronic symptoms currently stable, refills provided with initiation of controlled substance contract as per Assured toxicology protocol    Relevant Medications      DULoxetine (CYMBALTA) DR capsule      felodipine (PLENDIL) 24 hr tablet      furosemide (LASIX) tablet      metoprolol succinate (TOPROL-XL) 24 hr tablet      SUMAtriptan (IMITREX) tablet      topiramate (TOPAMAX) tablet      HYDROcodone-acetaminophen (NORCO) 7.5-325 MG per tablet   HYPERTENSION      BP Readings from Last 3 Encounters:  05/14/14 140/90  12/30/13 140/90    06/21/13 146/98   The current medical regimen is effective;  continue present plan and medications.     Relevant Medications      felodipine (PLENDIL) 24 hr tablet      furosemide (LASIX) tablet      metoprolol succinate (TOPROL-XL) 24 hr tablet   PEPTIC ULCER DISEASE, CHRONIC     History of ulcer surgery 2003, recurrence on EGD February 2014 Patient understands importance of avoiding NSAIDs and compliance with PPI daily The current medical regimen is effective;  continue present plan and medications.      Other Visit Diagnoses   Routine general medical examination at a health care facility    -  Primary

## 2014-05-14 NOTE — Progress Notes (Signed)
Pre visit review using our clinic review tool, if applicable. No additional management support is needed unless otherwise documented below in the visit note. 

## 2014-05-14 NOTE — Assessment & Plan Note (Signed)
follows with neurologist for same -Joretta Bachelor On Cymbalta, hydrocodone, and Imitrex as needed Receiving narcotics from PCP since spring 2015 following change of neurologist practice to [facility] Reports chronic symptoms currently stable, refills provided with initiation of controlled substance contract as per Assured toxicology protocol

## 2014-05-14 NOTE — Assessment & Plan Note (Signed)
History of ulcer surgery 2003, recurrence on EGD February 2014 Patient understands importance of avoiding NSAIDs and compliance with PPI daily The current medical regimen is effective;  continue present plan and medications.

## 2014-05-15 ENCOUNTER — Telehealth: Payer: Self-pay | Admitting: Internal Medicine

## 2014-05-15 NOTE — Telephone Encounter (Signed)
Relevant patient education assigned to patient using Emmi. ° °

## 2014-06-04 ENCOUNTER — Encounter: Payer: Self-pay | Admitting: Internal Medicine

## 2014-07-02 ENCOUNTER — Ambulatory Visit: Payer: 59 | Admitting: Internal Medicine

## 2014-07-28 ENCOUNTER — Telehealth: Payer: Self-pay | Admitting: Internal Medicine

## 2014-07-28 NOTE — Telephone Encounter (Signed)
Pt needs re-fill on HYDROcodone-acetaminophen (NORCO) 7.5-325 MG per tablet ° °

## 2014-07-29 MED ORDER — HYDROCODONE-ACETAMINOPHEN 7.5-325 MG PO TABS: 1.0000 | ORAL_TABLET | Freq: Three times a day (TID) | ORAL | Status: DC | PRN

## 2014-07-29 NOTE — Telephone Encounter (Signed)
Done hardcopy to robin  

## 2014-07-29 NOTE — Telephone Encounter (Signed)
Called the patient informed to pickup hardcopy at the front  Desk.  (Did leave a detailed message on machine).

## 2014-08-11 ENCOUNTER — Other Ambulatory Visit: Payer: Self-pay

## 2014-08-11 MED ORDER — VITAMIN D (ERGOCALCIFEROL) 1.25 MG (50000 UNIT) PO CAPS: 50000.0000 [IU] | ORAL_CAPSULE | ORAL | Status: DC

## 2014-09-22 ENCOUNTER — Telehealth: Payer: Self-pay | Admitting: Internal Medicine

## 2014-09-22 MED ORDER — HYDROCODONE-ACETAMINOPHEN 7.5-325 MG PO TABS: 1.0000 | ORAL_TABLET | Freq: Two times a day (BID) | ORAL | Status: DC | PRN

## 2014-09-22 NOTE — Telephone Encounter (Signed)
Called pt no answer LMOM (both phone) rx ready for pick-up...Michaela Rios

## 2014-09-22 NOTE — Telephone Encounter (Signed)
Pt called in and she is requesting a refill on HYDROcodone-acetaminophen (NORCO) 7.5-325 MG per tablet [[id number]

## 2014-09-22 NOTE — Telephone Encounter (Signed)
Will refill for #50 hydrocodone as she has not filled since 07/29/14 average 45 per month.

## 2014-09-22 NOTE — Telephone Encounter (Signed)
MD out of office. Pls advise...Johny Chess

## 2014-10-14 ENCOUNTER — Other Ambulatory Visit (INDEPENDENT_AMBULATORY_CARE_PROVIDER_SITE_OTHER): Payer: 59

## 2014-10-14 DIAGNOSIS — K277 Chronic peptic ulcer, site unspecified, without hemorrhage or perforation: Secondary | ICD-10-CM

## 2014-10-14 DIAGNOSIS — Z862 Personal history of diseases of the blood and blood-forming organs and certain disorders involving the immune mechanism: Secondary | ICD-10-CM | POA: Diagnosis not present

## 2014-10-14 DIAGNOSIS — E7800 Pure hypercholesterolemia, unspecified: Secondary | ICD-10-CM

## 2014-10-14 DIAGNOSIS — I10 Essential (primary) hypertension: Secondary | ICD-10-CM | POA: Diagnosis not present

## 2014-10-14 DIAGNOSIS — F411 Generalized anxiety disorder: Secondary | ICD-10-CM

## 2014-10-14 DIAGNOSIS — M1990 Unspecified osteoarthritis, unspecified site: Secondary | ICD-10-CM

## 2014-10-14 LAB — BASIC METABOLIC PANEL
BUN: 22 mg/dL (ref 6–23)
CALCIUM: 9.3 mg/dL (ref 8.4–10.5)
CO2: 21 meq/L (ref 19–32)
CREATININE: 1.2 mg/dL (ref 0.4–1.2)
Chloride: 114 mEq/L — ABNORMAL HIGH (ref 96–112)
GFR: 49.49 mL/min — AB (ref >60.00)
Glucose, Bld: 76 mg/dL (ref 70–99)
Potassium: 3.9 mEq/L (ref 3.5–5.1)
Sodium: 144 mEq/L (ref 135–145)

## 2014-10-14 LAB — CBC WITH DIFFERENTIAL/PLATELET
BASOS PCT: 0.9 % (ref 0.0–3.0)
Basophils Absolute: 0 10*3/uL (ref 0.0–0.1)
EOS ABS: 0.2 10*3/uL (ref 0.0–0.7)
Eosinophils Relative: 3.9 % (ref 0.0–5.0)
HCT: 37.3 % (ref 36.0–46.0)
HEMOGLOBIN: 12.1 g/dL (ref 12.0–15.0)
LYMPHS ABS: 1.4 10*3/uL (ref 0.7–4.0)
LYMPHS PCT: 36.2 % (ref 12.0–46.0)
MCHC: 32.3 g/dL (ref 30.0–36.0)
MCV: 89.6 fl (ref 78.0–100.0)
Monocytes Absolute: 0.3 10*3/uL (ref 0.1–1.0)
Monocytes Relative: 7.2 % (ref 3.0–12.0)
NEUTROS ABS: 2 10*3/uL (ref 1.4–7.7)
Neutrophils Relative %: 51.8 % (ref 43.0–77.0)
Platelets: 228 10*3/uL (ref 150.0–400.0)
RBC: 4.17 Mil/uL (ref 3.87–5.11)
RDW: 14.8 % (ref 11.5–15.5)
WBC: 3.9 10*3/uL — ABNORMAL LOW (ref 4.0–10.5)

## 2014-10-14 LAB — HEPATIC FUNCTION PANEL
ALK PHOS: 52 U/L (ref 39–117)
ALT: 12 U/L (ref 0–35)
AST: 25 U/L (ref 0–37)
Albumin: 4 g/dL (ref 3.5–5.2)
BILIRUBIN TOTAL: 0.4 mg/dL (ref 0.2–1.2)
Bilirubin, Direct: 0.1 mg/dL (ref 0.0–0.3)
Total Protein: 7.3 g/dL (ref 6.0–8.3)

## 2014-10-14 LAB — LIPID PANEL
Cholesterol: 166 mg/dL (ref 0–200)
HDL: 52.7 mg/dL (ref >39.00)
LDL CALC: 91 mg/dL (ref 0–99)
NonHDL: 113.3
TRIGLYCERIDES: 111 mg/dL (ref 0.0–149.0)
Total CHOL/HDL Ratio: 3
VLDL: 22.2 mg/dL (ref 0.0–40.0)

## 2014-10-14 LAB — IBC PANEL
Iron: 55 ug/dL (ref 42–145)
SATURATION RATIOS: 16.4 % — AB (ref 20.0–50.0)
TRANSFERRIN: 240.1 mg/dL (ref 212.0–360.0)

## 2014-10-14 LAB — TSH: TSH: 1.95 u[IU]/mL (ref 0.35–4.50)

## 2014-10-15 LAB — VITAMIN D 25 HYDROXY (VIT D DEFICIENCY, FRACTURES): Vit D, 25-Hydroxy: 99 ng/mL (ref 30–100)

## 2014-11-14 ENCOUNTER — Ambulatory Visit: Payer: 59 | Admitting: Internal Medicine

## 2014-11-17 ENCOUNTER — Other Ambulatory Visit: Payer: Self-pay

## 2014-11-17 ENCOUNTER — Telehealth: Payer: Self-pay | Admitting: Internal Medicine

## 2014-11-17 ENCOUNTER — Ambulatory Visit: Payer: 59 | Admitting: Internal Medicine

## 2014-11-17 MED ORDER — HYDROCODONE-ACETAMINOPHEN 7.5-325 MG PO TABS: 1.0000 | ORAL_TABLET | Freq: Two times a day (BID) | ORAL | Status: DC | PRN

## 2014-11-17 NOTE — Telephone Encounter (Signed)
Pt had to cancel and reschedule appt due to her furnace being broke this am. Pt rs'd for 1/14. Pt will need refills; hydrocodone ( will be out of this before 1/14) pt will call her pharmacy for other refills if she runs out.

## 2014-11-18 ENCOUNTER — Other Ambulatory Visit: Payer: Self-pay | Admitting: Internal Medicine

## 2014-11-18 NOTE — Telephone Encounter (Signed)
LVM for pt to call back.

## 2014-11-19 ENCOUNTER — Telehealth: Payer: Self-pay | Admitting: Internal Medicine

## 2014-11-19 NOTE — Telephone Encounter (Signed)
Is requesting script for [facility]ax [facility]

## 2014-11-20 NOTE — Telephone Encounter (Signed)
Rx was printed and faxed.   Called pharm and they do not have the fax.   Spoke to pharmacist and called in rx per MD order.

## 2014-12-11 ENCOUNTER — Ambulatory Visit: Payer: 59 | Admitting: Internal Medicine

## 2015-02-04 ENCOUNTER — Telehealth: Payer: Self-pay | Admitting: Internal Medicine

## 2015-02-04 NOTE — Telephone Encounter (Signed)
Pt request refill for hydrocodone. Please call pt

## 2015-02-05 MED ORDER — HYDROCODONE-ACETAMINOPHEN 7.5-325 MG PO TABS: 1.0000 | ORAL_TABLET | Freq: Two times a day (BID) | ORAL | Status: DC | PRN

## 2015-03-18 ENCOUNTER — Ambulatory Visit (INDEPENDENT_AMBULATORY_CARE_PROVIDER_SITE_OTHER): Payer: 59 | Admitting: Internal Medicine

## 2015-03-18 ENCOUNTER — Encounter: Payer: Self-pay | Admitting: Internal Medicine

## 2015-03-18 VITALS — BP 138/100 | HR 86 | Temp 98.2°F | Ht 63.5 in | Wt 128.8 lb

## 2015-03-18 DIAGNOSIS — F32A Depression, unspecified: Secondary | ICD-10-CM

## 2015-03-18 DIAGNOSIS — G43709 Chronic migraine without aura, not intractable, without status migrainosus: Secondary | ICD-10-CM

## 2015-03-18 DIAGNOSIS — F329 Major depressive disorder, single episode, unspecified: Secondary | ICD-10-CM | POA: Diagnosis not present

## 2015-03-18 DIAGNOSIS — IMO0002 Reserved for concepts with insufficient information to code with codable children: Secondary | ICD-10-CM

## 2015-03-18 DIAGNOSIS — I10 Essential (primary) hypertension: Secondary | ICD-10-CM

## 2015-03-18 MED ORDER — VITAMIN D (ERGOCALCIFEROL) 1.25 MG (50000 UNIT) PO CAPS: 50000.0000 [IU] | ORAL_CAPSULE | ORAL | Status: DC

## 2015-03-18 MED ORDER — OMEPRAZOLE 40 MG PO CPDR: DELAYED_RELEASE_CAPSULE | ORAL | Status: DC

## 2015-03-18 MED ORDER — TOPIRAMATE 50 MG PO TABS: 50.0000 mg | ORAL_TABLET | Freq: Two times a day (BID) | ORAL | Status: DC

## 2015-03-18 MED ORDER — METOPROLOL SUCCINATE ER 25 MG PO TB24: 25.0000 mg | ORAL_TABLET | Freq: Every day | ORAL | Status: DC

## 2015-03-18 MED ORDER — HYDROCODONE-ACETAMINOPHEN 7.5-325 MG PO TABS: 1.0000 | ORAL_TABLET | Freq: Two times a day (BID) | ORAL | Status: DC | PRN

## 2015-03-18 MED ORDER — FELODIPINE ER 5 MG PO TB24: 5.0000 mg | ORAL_TABLET | Freq: Every day | ORAL | Status: DC

## 2015-03-18 NOTE — Assessment & Plan Note (Signed)
BP Readings from Last 3 Encounters:  03/18/15 138/100  05/14/14 140/90  12/30/13 140/90   Prior cough with ACEI Increase beta-blocker dose now and continue CCB  Pt to monitor at home and call if >140/90

## 2015-03-18 NOTE — Assessment & Plan Note (Signed)
follows with neurologist for same -Hagen On Cymbalta, hydrocodone, and Imitrex as needed Receiving narcotics from PCP since spring 2015 following change of neurologist practice to [facility] Reports chronic symptoms currently stable, refills provided with initiation of controlled substance contract as per Assured toxicology protocol Recheck UDS today with refill

## 2015-03-18 NOTE — Progress Notes (Signed)
Subjective:    Patient ID: Michaela Rios, female    DOB: 12-09-1950, 63 y.o.   MRN: [id number]  HPI  Patient here for followup  Past Medical History  Diagnosis Date  . Hypertension   . Peripheral vascular disease   . GERD (gastroesophageal reflux disease)   . IBS (irritable bowel syndrome)   . Low back pain syndrome   . Chronic migraine     follows with neuro for same  . Anxiety   . Vitamin D deficiency   . Peptic ulcer disease 2003, 12/2012  . Esophagitis   . MRSA (methicillin resistant Staphylococcus aureus) infection 04/17/12    left torso  . Anemia   . Arthritis   . Cataract   . Clotting disorder     clot in ovarian vein- hx    Review of Systems  Constitutional: Positive for fatigue. Negative for unexpected weight change.  Respiratory: Negative for cough and shortness of breath.   Cardiovascular: Negative for chest pain.  Neurological: Positive for headaches (chronic migraine w/o change).  Psychiatric/Behavioral: Positive for dysphoric mood and decreased concentration. Negative for suicidal ideas, confusion and sleep disturbance. The patient is not nervous/anxious.        Objective:    Physical Exam  Constitutional: She appears well-developed and well-nourished. No distress.  Cardiovascular: Normal rate, regular rhythm and normal heart sounds.   No murmur heard. Pulmonary/Chest: Effort normal and breath sounds normal. No respiratory distress.  Musculoskeletal: She exhibits no edema.  Psychiatric: Her speech is normal. Judgment and thought content normal. Her affect is blunt. Her affect is not inappropriate. She is withdrawn. She is not agitated and not actively hallucinating. Cognition and memory are normal. She exhibits a depressed mood. She expresses no homicidal and no suicidal ideation.    BP 138/100 mmHg  Pulse 86  Temp(Src) 98.2 F (36.8 C) (Oral)  Ht 5' 3.5" (1.613 m)  Wt 128 lb 12 oz (58.401 kg)  BMI 22.45 kg/m2  SpO2 98% Wt Readings from Last  3 Encounters:  03/18/15 128 lb 12 oz (58.401 kg)  05/14/14 132 lb 6.4 oz (60.056 kg)  12/30/13 136 lb 12.8 oz (62.052 kg)     Lab Results  Component Value Date   WBC 3.9* 10/14/2014   HGB 12.1 10/14/2014   HCT 37.3 10/14/2014   PLT 228.0 10/14/2014   GLUCOSE 76 10/14/2014   CHOL 166 10/14/2014   TRIG 111.0 10/14/2014   HDL 52.70 10/14/2014   LDLCALC 91 10/14/2014   ALT 12 10/14/2014   AST 25 10/14/2014   NA 144 10/14/2014   K 3.9 10/14/2014   CL 114* 10/14/2014   CREATININE 1.2 10/14/2014   BUN 22 10/14/2014   CO2 21 10/14/2014   TSH 1.95 10/14/2014    Dg Chest 2 View  09/26/2011   *RADIOLOGY REPORT*  Clinical Data: Nonsmoker with cough for 1 month.  Hypertension. Pulmonary nodule.  CHEST - 2 VIEW  Comparison: Chest radiographs 06/08/2009 and CT 12/03/2004.  Findings: The heart size and mediastinal contours are stable with aortic tortuosity.  The lungs appear clear.  No pulmonary nodules are identified.  There is no pleural effusion.  Osseous structures appear stable with mild thoracolumbar scoliosis.  IMPRESSION: Stable examination.  No acute cardiopulmonary process or pulmonary nodule demonstrated.  Original Report Authenticated By: Vivia Ewing, M.D.      Assessment & Plan:   Problem List Items Addressed This Visit    Chronic migraine    follows with neurologist  for same -Joretta Bachelor On Cymbalta, hydrocodone, and Imitrex as needed Receiving narcotics from PCP since spring 2015 following change of neurologist practice to [facility] Reports chronic symptoms currently stable, refills provided with initiation of controlled substance contract as per Assured toxicology protocol Recheck UDS today with refill      Relevant Medications   cyclobenzaprine (FLEXERIL) 10 MG tablet   EPINEPHRINE, ANAPHYLAXIS THERAPY AGENTS,   SUMAtriptan (IMITREX) 20 MG/ACT nasal spray   venlafaxine XR (EFFEXOR-XR) 75 MG 24 hr capsule   tiZANidine (ZANAFLEX) 4 MG tablet   felodipine  (PLENDIL) 5 MG 24 hr tablet   topiramate (TOPAMAX) 50 MG tablet   metoprolol succinate (TOPROL-XL) 25 MG 24 hr tablet   metoprolol succinate (TOPROL-XL) 50 MG 24 hr tablet   HYDROcodone-acetaminophen (NORCO) 7.5-325 MG per tablet   Depression - Primary    Chronic symptoms, exacerbated by death of brother in 2013-03-13 Neuro change cymbalta to effexor 09/2014 and pt unsure if symptoms as well controlled on new med Prior counseling with [facility] psychotherapist Letta Moynahan) - ok with resuming same encourgaed taking max dose of Effexor to 225mg /day now and follow up neuro next month as planned for ?alt med if still ineffective      Relevant Medications   venlafaxine XR (EFFEXOR-XR) 75 MG 24 hr capsule   Other Relevant Orders   Ambulatory referral to Psychology   Essential hypertension    BP Readings from Last 3 Encounters:  03/18/15 138/100  05/14/14 140/90  12/30/13 140/90   Prior cough with ACEI Increase beta-blocker dose now and continue CCB  Pt to monitor at home and call if >140/90      Relevant Medications   EPINEPHRINE, ANAPHYLAXIS THERAPY AGENTS,   felodipine (PLENDIL) 5 MG 24 hr tablet   metoprolol succinate (TOPROL-XL) 25 MG 24 hr tablet   metoprolol succinate (TOPROL-XL) 50 MG 24 hr tablet       Gwendolyn Grant, MD

## 2015-03-18 NOTE — Patient Instructions (Addendum)
It was good to see you today.  We have reviewed your prior records including labs and tests today  Medications reviewed and updated Increase Effexor to 225mg  daily until you see Dr Joretta Bachelor next month Add 25mg  Toprol to 50mg  daily Toprol for toal 75mg  each day to help blood pressure control No other changes recommended at this time. Your prescription(s) have been submitted to your pharmacy. Please take as directed and contact our office if you believe you are having problem(s) with the medication(s).  we'll make referral to counselor for depression therapy. Our office will contact you regarding appointment(s) once made.  Please schedule followup in 3-4 months for annyual exam and med adjustment, call sooner if problems.  Depression Depression refers to feeling sad, low, down in the dumps, blue, gloomy, or empty. In general, there are two kinds of depression: 1. Normal sadness or normal grief. This kind of depression is one that we all feel from time to time after upsetting life experiences, such as the loss of a job or the ending of a relationship. This kind of depression is considered normal, is short lived, and resolves within a few days to 2 weeks. Depression experienced after the loss of a loved one (bereavement) often lasts longer than 2 weeks but normally gets better with time. 2. Clinical depression. This kind of depression lasts longer than normal sadness or normal grief or interferes with your ability to function at home, at work, and in school. It also interferes with your personal relationships. It affects almost every aspect of your life. Clinical depression is an illness. Symptoms of depression can also be caused by conditions other than those mentioned above, such as:  Physical illness. Some physical illnesses, including underactive thyroid gland (hypothyroidism), severe anemia, specific types of cancer, diabetes, uncontrolled seizures, heart and lung problems, strokes, and chronic  pain are commonly associated with symptoms of depression.  Side effects of some prescription medicine. In some people, certain types of medicine can cause symptoms of depression.  Substance abuse. Abuse of alcohol and illicit drugs can cause symptoms of depression. SYMPTOMS Symptoms of normal sadness and normal grief include the following:  Feeling sad or crying for short periods of time.  Not caring about anything (apathy).  Difficulty sleeping or sleeping too much.  No longer able to enjoy the things you used to enjoy.  Desire to be by oneself all the time (social isolation).  Lack of energy or motivation.  Difficulty concentrating or remembering.  Change in appetite or weight.  Restlessness or agitation. Symptoms of clinical depression include the same symptoms of normal sadness or normal grief and also the following symptoms:  Feeling sad or crying all the time.  Feelings of guilt or worthlessness.  Feelings of hopelessness or helplessness.  Thoughts of suicide or the desire to harm yourself (suicidal ideation).  Loss of touch with reality (psychotic symptoms). Seeing or hearing things that are not real (hallucinations) or having false beliefs about your life or the people around you (delusions and paranoia). DIAGNOSIS  The diagnosis of clinical depression is usually based on how bad the symptoms are and how long they have lasted. Your health care provider will also ask you questions about your medical history and substance use to find out if physical illness, use of prescription medicine, or substance abuse is causing your depression. Your health care provider may also order blood tests. TREATMENT  Often, normal sadness and normal grief do not require treatment. However, sometimes antidepressant medicine is given  for bereavement to ease the depressive symptoms until they resolve. The treatment for clinical depression depends on how bad the symptoms are but often includes  antidepressant medicine, counseling with a mental health professional, or both. Your health care provider will help to determine what treatment is best for you. Depression caused by physical illness usually goes away with appropriate medical treatment of the illness. If prescription medicine is causing depression, talk with your health care provider about stopping the medicine, decreasing the dose, or changing to another medicine. Depression caused by the abuse of alcohol or illicit drugs goes away when you stop using these substances. Some adults need professional help in order to stop drinking or using drugs. SEEK IMMEDIATE MEDICAL CARE IF:  You have thoughts about hurting yourself or others.  You lose touch with reality (have psychotic symptoms).  You are taking medicine for depression and have a serious side effect. FOR MORE INFORMATION  National Alliance on Mental Illness: www.nami.[employer]: https://carter.com/ Document Released: 11/11/2000 Document Revised: 03/31/2014 Document Reviewed: 02/13/2012 [facility] Patient Information 2015 [place], [place]. This information is not intended to replace advice given to you by your health care provider. Make sure you discuss any questions you have with your health care provider.

## 2015-03-18 NOTE — Progress Notes (Signed)
Pre visit review using our clinic review tool, if applicable. No additional management support is needed unless otherwise documented below in the visit note. 

## 2015-03-18 NOTE — Assessment & Plan Note (Signed)
Chronic symptoms, exacerbated by death of brother in 13-Mar-2013 Neuro change cymbalta to effexor 09/2014 and pt unsure if symptoms as well controlled on new med Prior counseling with [facility] psychotherapist Letta Moynahan) - ok with resuming same encourgaed taking max dose of Effexor to 225mg /day now and follow up neuro next month as planned for ?alt med if still ineffective

## 2015-03-26 ENCOUNTER — Other Ambulatory Visit: Payer: Self-pay | Admitting: Internal Medicine

## 2015-03-26 NOTE — Telephone Encounter (Signed)
MD out of office. Is this ok to refill.../lmb 

## 2015-03-27 NOTE — Telephone Encounter (Signed)
MD out of office pls advise on refill.../lmb 

## 2015-03-27 NOTE — Telephone Encounter (Signed)
Done hardcopy to Cherina  

## 2015-03-27 NOTE — Telephone Encounter (Signed)
Rx faxed to pharmacy  

## 2015-04-01 ENCOUNTER — Other Ambulatory Visit: Payer: Self-pay | Admitting: *Deleted

## 2015-04-01 MED ORDER — CILIDINIUM-CHLORDIAZEPOXIDE 2.5-5 MG PO CAPS: 1.0000 | ORAL_CAPSULE | Freq: Two times a day (BID) | ORAL | Status: DC | PRN

## 2015-04-14 ENCOUNTER — Encounter: Payer: Self-pay | Admitting: Internal Medicine

## 2015-05-06 ENCOUNTER — Telehealth: Payer: Self-pay | Admitting: Internal Medicine

## 2015-05-06 NOTE — Telephone Encounter (Signed)
Patient is requesting refill for HYDROcodone-acetaminophen (NORCO) 7.5-325 MG per tablet [[id number]]

## 2015-05-07 ENCOUNTER — Other Ambulatory Visit: Payer: Self-pay | Admitting: Geriatric Medicine

## 2015-05-07 MED ORDER — HYDROCODONE-ACETAMINOPHEN 7.5-325 MG PO TABS: 1.0000 | ORAL_TABLET | Freq: Two times a day (BID) | ORAL | Status: DC | PRN

## 2015-05-07 NOTE — Telephone Encounter (Signed)
Left message on voice mail informing patient that she has a prescription at the front to be picked up.

## 2015-05-07 NOTE — Telephone Encounter (Signed)
Will fill for #30 only since not filled since 03/18/15 this would appear to be a 1 month supply.

## 2015-05-28 ENCOUNTER — Other Ambulatory Visit: Payer: Self-pay | Admitting: Internal Medicine

## 2015-06-30 ENCOUNTER — Other Ambulatory Visit: Payer: Self-pay | Admitting: Internal Medicine

## 2015-06-30 NOTE — Telephone Encounter (Signed)
rx faxed to [facility] 

## 2015-07-07 ENCOUNTER — Telehealth: Payer: Self-pay | Admitting: Internal Medicine

## 2015-07-07 NOTE — Telephone Encounter (Signed)
MD out of office pls advise.../lmb 

## 2015-07-07 NOTE — Telephone Encounter (Signed)
Patient requesting refill for HYDROcodone-acetaminophen (NORCO) 7.5-325 MG per tablet [[id number]]

## 2015-07-09 MED ORDER — HYDROCODONE-ACETAMINOPHEN 7.5-325 MG PO TABS: 1.0000 | ORAL_TABLET | Freq: Two times a day (BID) | ORAL | Status: DC | PRN

## 2015-07-09 NOTE — Telephone Encounter (Signed)
Called pt no answer LMOM rx ready for pick-up. Put rx in cabinet...Michaela Rios

## 2015-07-09 NOTE — Telephone Encounter (Signed)
sch ov OK to fill this prescription with additional refills x0 Thank you!

## 2015-08-12 ENCOUNTER — Telehealth: Payer: Self-pay | Admitting: *Deleted

## 2015-08-12 NOTE — Telephone Encounter (Signed)
Left msg on triage requesting refill on her hydrocodone. MD is out of the office is this ok to refill...Michaela Rios

## 2015-08-13 MED ORDER — HYDROCODONE-ACETAMINOPHEN 7.5-325 MG PO TABS: 1.0000 | ORAL_TABLET | Freq: Two times a day (BID) | ORAL | Status: DC | PRN

## 2015-08-13 NOTE — Telephone Encounter (Signed)
Printed and signed.  

## 2015-08-13 NOTE — Telephone Encounter (Signed)
Dr. Asa Lente pls advise on refill below...Michaela Rios

## 2015-08-13 NOTE — Telephone Encounter (Signed)
Notified pt rx ready for pick-up.../lmb 

## 2015-09-08 ENCOUNTER — Ambulatory Visit: Payer: 59 | Admitting: Internal Medicine

## 2015-10-01 ENCOUNTER — Telehealth: Payer: Self-pay

## 2015-10-01 MED ORDER — HYDROCODONE-ACETAMINOPHEN 7.5-325 MG PO TABS: 1.0000 | ORAL_TABLET | Freq: Two times a day (BID) | ORAL | Status: DC | PRN

## 2015-10-01 MED ORDER — EPINEPHRINE 0.3 MG/0.3ML IJ SOAJ
0.3000 mg | Freq: Once | INTRAMUSCULAR | Status: DC
Start: 2015-10-01 — End: 2017-02-02

## 2015-10-01 NOTE — Telephone Encounter (Signed)
Pharmacy stated that the #60 is not able to be dispensed. It will have to be rewritten.

## 2015-10-01 NOTE — Telephone Encounter (Signed)
Pt called and requested a refill of her epipen and hydrocodone.

## 2015-10-01 NOTE — Addendum Note (Signed)
Addended by: Pricilla Holm A on: 10/01/2015 03:43 PM   Modules accepted: Orders

## 2015-10-01 NOTE — Telephone Encounter (Signed)
Will not fill since last filled for 3 month supply 2 months ago. Per [place] narcotic database filled on 09/03/15 for 1 month supply. Concern exists that she still could have 60 remaining pills. Please call pharmacy and verify.

## 2015-10-01 NOTE — Telephone Encounter (Signed)
Hydrocodone filled for 1 month supply. Please UDS at visit.

## 2015-10-02 NOTE — Telephone Encounter (Signed)
Called pt no answer LMOM rx ready for pick-up.../lmb 

## 2015-10-20 ENCOUNTER — Other Ambulatory Visit (INDEPENDENT_AMBULATORY_CARE_PROVIDER_SITE_OTHER): Payer: 59

## 2015-10-20 ENCOUNTER — Encounter: Payer: Self-pay | Admitting: Internal Medicine

## 2015-10-20 ENCOUNTER — Other Ambulatory Visit: Payer: 59

## 2015-10-20 ENCOUNTER — Ambulatory Visit (INDEPENDENT_AMBULATORY_CARE_PROVIDER_SITE_OTHER): Payer: 59 | Admitting: Internal Medicine

## 2015-10-20 ENCOUNTER — Other Ambulatory Visit: Payer: Self-pay | Admitting: Internal Medicine

## 2015-10-20 VITALS — BP 122/84 | HR 77 | Temp 97.7°F | Ht 63.5 in | Wt 137.5 lb

## 2015-10-20 DIAGNOSIS — J301 Allergic rhinitis due to pollen: Secondary | ICD-10-CM

## 2015-10-20 DIAGNOSIS — F329 Major depressive disorder, single episode, unspecified: Secondary | ICD-10-CM

## 2015-10-20 DIAGNOSIS — R300 Dysuria: Secondary | ICD-10-CM

## 2015-10-20 DIAGNOSIS — F32A Depression, unspecified: Secondary | ICD-10-CM

## 2015-10-20 DIAGNOSIS — I10 Essential (primary) hypertension: Secondary | ICD-10-CM

## 2015-10-20 DIAGNOSIS — Z1159 Encounter for screening for other viral diseases: Secondary | ICD-10-CM

## 2015-10-20 DIAGNOSIS — IMO0002 Reserved for concepts with insufficient information to code with codable children: Secondary | ICD-10-CM

## 2015-10-20 DIAGNOSIS — G43709 Chronic migraine without aura, not intractable, without status migrainosus: Secondary | ICD-10-CM | POA: Diagnosis not present

## 2015-10-20 DIAGNOSIS — Z114 Encounter for screening for human immunodeficiency virus [HIV]: Secondary | ICD-10-CM | POA: Diagnosis not present

## 2015-10-20 DIAGNOSIS — Z0000 Encounter for general adult medical examination without abnormal findings: Secondary | ICD-10-CM | POA: Diagnosis not present

## 2015-10-20 LAB — CBC WITH DIFFERENTIAL/PLATELET
Basophils Absolute: 0 10*3/uL (ref 0.0–0.1)
Basophils Relative: 0.4 % (ref 0.0–3.0)
EOS ABS: 0.2 10*3/uL (ref 0.0–0.7)
Eosinophils Relative: 4.2 % (ref 0.0–5.0)
HCT: 38.1 % (ref 36.0–46.0)
HEMOGLOBIN: 12 g/dL (ref 12.0–15.0)
LYMPHS ABS: 1.2 10*3/uL (ref 0.7–4.0)
Lymphocytes Relative: 32.4 % (ref 12.0–46.0)
MCHC: 31.5 g/dL (ref 30.0–36.0)
MCV: 90.3 fl (ref 78.0–100.0)
MONO ABS: 0.4 10*3/uL (ref 0.1–1.0)
Monocytes Relative: 10.2 % (ref 3.0–12.0)
NEUTROS PCT: 52.8 % (ref 43.0–77.0)
Neutro Abs: 1.9 10*3/uL (ref 1.4–7.7)
Platelets: 215 10*3/uL (ref 150.0–400.0)
RBC: 4.22 Mil/uL (ref 3.87–5.11)
RDW: 14.7 % (ref 11.5–15.5)
WBC: 3.6 10*3/uL — AB (ref 4.0–10.5)

## 2015-10-20 LAB — LIPID PANEL
CHOLESTEROL: 154 mg/dL (ref 0–200)
HDL: 53.7 mg/dL (ref >39.00)
LDL Cholesterol: 84 mg/dL (ref 0–99)
NonHDL: 100.64
Total CHOL/HDL Ratio: 3
Triglycerides: 85 mg/dL (ref 0.0–149.0)
VLDL: 17 mg/dL (ref 0.0–40.0)

## 2015-10-20 LAB — HEPATIC FUNCTION PANEL
ALBUMIN: 4.1 g/dL (ref 3.5–5.2)
ALT: 21 U/L (ref 0–35)
AST: 24 U/L (ref 0–37)
Alkaline Phosphatase: 82 U/L (ref 39–117)
Bilirubin, Direct: 0 mg/dL (ref 0.0–0.3)
Total Bilirubin: 0.3 mg/dL (ref 0.2–1.2)
Total Protein: 7.8 g/dL (ref 6.0–8.3)

## 2015-10-20 LAB — POCT URINALYSIS DIPSTICK
BILIRUBIN UA: NEGATIVE
Glucose, UA: NEGATIVE
Ketones, UA: NEGATIVE
NITRITE UA: NEGATIVE
PH UA: 6
Protein, UA: POSITIVE
RBC UA: POSITIVE
SPEC GRAV UA: 1.025
Urobilinogen, UA: 1

## 2015-10-20 LAB — BASIC METABOLIC PANEL
BUN: 28 mg/dL — AB (ref 6–23)
CALCIUM: 9.3 mg/dL (ref 8.4–10.5)
CO2: 25 mEq/L (ref 19–32)
CREATININE: 1.06 mg/dL (ref 0.40–1.20)
Chloride: 107 mEq/L (ref 96–112)
GFR: 55.29 mL/min — AB (ref >60.00)
GLUCOSE: 82 mg/dL (ref 70–99)
POTASSIUM: 3.9 meq/L (ref 3.5–5.1)
Sodium: 140 mEq/L (ref 135–145)

## 2015-10-20 LAB — TSH: TSH: 2.11 u[IU]/mL (ref 0.35–4.50)

## 2015-10-20 MED ORDER — SULFAMETHOXAZOLE-TRIMETHOPRIM 400-80 MG PO TABS: 1.0000 | ORAL_TABLET | Freq: Two times a day (BID) | ORAL | Status: DC

## 2015-10-20 MED ORDER — FLUTICASONE PROPIONATE 50 MCG/ACT NA SUSP: 2.0000 | Freq: Every day | NASAL | Status: DC

## 2015-10-20 NOTE — Patient Instructions (Signed)
It was good to see you today.  We have reviewed your prior records including labs and tests today  Health Maintenance reviewed - all recommended immunizations and age-appropriate screenings are up-to-date.  Test(s) ordered today. Your results will be released to MyChart (or called to you) after review, usually within 72hours after test completion. If any changes need to be made, you will be notified at that same time.  Medications reviewed and updated, no changes recommended at this time.  Please schedule followup in 12 months for annual exam and labs, call sooner if problems.  

## 2015-10-20 NOTE — Progress Notes (Signed)
Pre visit review using our clinic review tool, if applicable. No additional management support is needed unless otherwise documented below in the visit note. 

## 2015-10-20 NOTE — Assessment & Plan Note (Signed)
Over-the-counter antihistamine and nasal steroid as needed. Refills provided today

## 2015-10-20 NOTE — Assessment & Plan Note (Signed)
BP Readings from Last 3 Encounters:  10/20/15 122/84  03/18/15 138/100  05/14/14 140/90   Prior cough with ACEI Increased beta-blocker dose 02/2015 and continue CCB  -improved Pt to monitor at home and call if >140/90 The current medical regimen is effective;  continue present plan and medications.

## 2015-10-20 NOTE — Progress Notes (Signed)
Subjective:    Patient ID: Michaela Rios, female    DOB: 1951/05/25, 64 y.o.   MRN: [id number]  HPI  patient is here today for annual physical. Patient feels well overall Also reviewed chronic medical conditions, interval events and current concerns  Past Medical History  Diagnosis Date  . Hypertension   . Peripheral vascular disease ([place])   . GERD (gastroesophageal reflux disease)   . IBS (irritable bowel syndrome)   . Low back pain syndrome   . Chronic migraine     follows with neuro for same  . Anxiety   . Vitamin D deficiency   . Peptic ulcer disease 2003, 12/2012  . Esophagitis   . MRSA (methicillin resistant Staphylococcus aureus) infection 04/17/12    left torso  . Anemia   . Arthritis   . Cataract   . Clotting disorder ([facility])     clot in ovarian vein- hx   Family History  Problem Relation Age of Onset  . Aneurysm Father   . Prostate cancer Brother   . Colon cancer Neg Hx   . Esophageal cancer Neg Hx   . Rectal cancer Neg Hx   . Stomach cancer Neg Hx   . Lung cancer Brother    Social History  Substance Use Topics  . Smoking status: Never Smoker   . Smokeless tobacco: Never Used  . Alcohol Use: Yes     Comment: rare    Review of Systems  Constitutional: Negative for fatigue and unexpected weight change.  Respiratory: Negative for cough, shortness of breath and wheezing.   Cardiovascular: Negative for chest pain, palpitations and leg swelling.  Gastrointestinal: Negative for nausea, abdominal pain and diarrhea.  Genitourinary: Positive for dysuria, urgency and frequency. Negative for flank pain, difficulty urinating and pelvic pain. Hematuria: ??dark x 24h, same with prior UTI this summer.  Neurological: Negative for dizziness, weakness, light-headedness and headaches.  Psychiatric/Behavioral: Negative for dysphoric mood. The patient is not nervous/anxious.   All other systems reviewed and are negative.      Objective:    Physical Exam    Constitutional: She is oriented to person, place, and time. She appears well-developed and well-nourished. No distress.  HENT:  Head: Normocephalic and atraumatic.  Right Ear: External ear normal.  Left Ear: External ear normal.  Nose: Nose normal.  Mouth/Throat: Oropharynx is clear and moist. No oropharyngeal exudate.  Eyes: EOM are normal. Pupils are equal, round, and reactive to light. Right eye exhibits no discharge. Left eye exhibits no discharge. No scleral icterus.  Neck: Normal range of motion. Neck supple. No JVD present. No tracheal deviation present. No thyromegaly present.  Cardiovascular: Normal rate, regular rhythm, normal heart sounds and intact distal pulses.  Exam reveals no friction rub.   No murmur heard. Pulmonary/Chest: Effort normal and breath sounds normal. No respiratory distress. She has no wheezes. She has no rales. She exhibits no tenderness.  Abdominal: Soft. Bowel sounds are normal. She exhibits no distension and no mass. There is no tenderness. There is no rebound and no guarding.  Genitourinary:  Defer to gyn  Musculoskeletal: Normal range of motion.  No gross deformities  Lymphadenopathy:    She has no cervical adenopathy.  Neurological: She is alert and oriented to person, place, and time. She has normal reflexes. No cranial nerve deficit.  Skin: Skin is warm and dry. No rash noted. She is not diaphoretic. No erythema.  Psychiatric: She has a normal mood and affect. Her behavior is normal.  Judgment and thought content normal.  Nursing note and vitals reviewed.   BP 122/84 mmHg  Pulse 77  Temp(Src) 97.7 F (36.5 C) (Oral)  Ht 5' 3.5" (1.613 m)  Wt 137 lb 8 oz (62.37 kg)  BMI 23.97 kg/m2  SpO2 98% Wt Readings from Last 3 Encounters:  10/20/15 137 lb 8 oz (62.37 kg)  03/18/15 128 lb 12 oz (58.401 kg)  05/14/14 132 lb 6.4 oz (60.056 kg)    Lab Results  Component Value Date   WBC 3.9* 10/14/2014   HGB 12.1 10/14/2014   HCT 37.3 10/14/2014    PLT 228.0 10/14/2014   GLUCOSE 76 10/14/2014   CHOL 166 10/14/2014   TRIG 111.0 10/14/2014   HDL 52.70 10/14/2014   LDLCALC 91 10/14/2014   ALT 12 10/14/2014   AST 25 10/14/2014   NA 144 10/14/2014   K 3.9 10/14/2014   CL 114* 10/14/2014   CREATININE 1.2 10/14/2014   BUN 22 10/14/2014   CO2 21 10/14/2014   TSH 1.95 10/14/2014    Dg Chest 2 View  09/26/2011  *RADIOLOGY REPORT* Clinical Data: Nonsmoker with cough for 1 month.  Hypertension. Pulmonary nodule. CHEST - 2 VIEW Comparison: Chest radiographs 06/08/2009 and CT 12/03/2004. Findings: The heart size and mediastinal contours are stable with aortic tortuosity.  The lungs appear clear.  No pulmonary nodules are identified.  There is no pleural effusion.  Osseous structures appear stable with mild thoracolumbar scoliosis. IMPRESSION: Stable examination.  No acute cardiopulmonary process or pulmonary nodule demonstrated. Original Report Authenticated By: Vivia Ewing, M.D.      Assessment & Plan:   CPX/.z00.00 - Patient has been counseled on age-appropriate routine health concerns for screening and prevention. These are reviewed and up-to-date. Immunizations are up-to-date or declined. Labs ordered and reviewed.  Dyuria - +POC UA - send for Ucx and start empiric abx pending results  Problem List Items Addressed This Visit    Allergic rhinitis    Over-the-counter antihistamine and nasal steroid as needed. Refills provided today      Relevant Orders   CBC with Differential/Platelet   Chronic migraine    follows with neurologist for same -Joretta Bachelor On Cymbalta, hydrocodone, and Imitrex as needed Receiving narcotics from PCP since spring 2015 following change of neurologist practice to [facility] Reports chronic symptoms currently stable, refills provided with initiation of controlled substance contract as per [facility] protocol Recheck UDS per routine with refill      Relevant Medications   SUMAtriptan  (IMITREX) 100 MG tablet   Other Relevant Orders   CBC with Differential/Platelet   Hepatic function panel   TSH   Depression    Chronic symptoms, exacerbated by death of brother in 03/19/2013 Neuro change cymbalta to effexor 09/2014 and pt unsure if symptoms as well controlled on new med Prior counseling with [facility] psychotherapist Letta Moynahan) - ok with resuming same encourgaed to continue taking max dose of Effexor to 225mg /day now       Relevant Orders   CBC with Differential/Platelet   Hepatic function panel   TSH   Essential hypertension    BP Readings from Last 3 Encounters:  10/20/15 122/84  03/18/15 138/100  05/14/14 140/90   Prior cough with ACEI Increased beta-blocker dose Mar 20, 2015 and continue CCB  -improved Pt to monitor at home and call if >140/90 The current medical regimen is effective;  continue present plan and medications.       Relevant Orders   Basic metabolic panel  Lipid panel    Other Visit Diagnoses    Routine general medical examination at a health care facility    -  Primary    Dysuria        Relevant Orders    POCT urinalysis dipstick (Completed)    Urine culture        Gwendolyn Grant, MD

## 2015-10-20 NOTE — Assessment & Plan Note (Signed)
Chronic symptoms, exacerbated by death of brother in 03-18-2013 Neuro change cymbalta to effexor 09/2014 and pt unsure if symptoms as well controlled on new med Prior counseling with [facility] psychotherapist Letta Moynahan) - ok with resuming same encourgaed to continue taking max dose of Effexor to 225mg /day now

## 2015-10-20 NOTE — Assessment & Plan Note (Signed)
follows with neurologist for same -Michaela Rios On Cymbalta, hydrocodone, and Imitrex as needed Receiving narcotics from PCP since spring 2015 following change of neurologist practice to [facility] Reports chronic symptoms currently stable, refills provided with initiation of controlled substance contract as per [facility] protocol Recheck UDS per routine with refill

## 2015-10-20 NOTE — Addendum Note (Signed)
Addended by: Karle Barr on: 10/20/2015 09:49 AM   Modules accepted: Miquel Dunn

## 2015-10-21 LAB — HIV ANTIBODY (ROUTINE TESTING W REFLEX): HIV 1&2 Ab, 4th Generation: NONREACTIVE

## 2015-10-21 LAB — HEPATITIS C ANTIBODY: HCV AB: NEGATIVE

## 2015-10-22 LAB — URINE CULTURE
COLONY COUNT: NO GROWTH
Colony Count: NO GROWTH
ORGANISM ID, BACTERIA: NO GROWTH
Organism ID, Bacteria: NO GROWTH

## 2015-10-30 ENCOUNTER — Encounter: Payer: Self-pay | Admitting: Internal Medicine

## 2015-11-06 ENCOUNTER — Other Ambulatory Visit: Payer: Self-pay | Admitting: Internal Medicine

## 2015-11-06 MED ORDER — HYDROCODONE-ACETAMINOPHEN 7.5-325 MG PO TABS: 1.0000 | ORAL_TABLET | Freq: Two times a day (BID) | ORAL | Status: DC | PRN

## 2015-11-06 NOTE — Telephone Encounter (Signed)
Please advise.   Dr. Asa Lente will be back on Monday if you do not want to fill this rx.

## 2015-11-06 NOTE — Telephone Encounter (Signed)
Dr. [name] assistant Lovena Le brought me signed rx.   Pt contacted to pick up rx.

## 2015-11-06 NOTE — Telephone Encounter (Signed)
I will prescribe for one month - she needs to come in before Feb for additional refills.  rx printed

## 2015-11-06 NOTE — Telephone Encounter (Signed)
Patient requesting refill for HYDROcodone-acetaminophen (NORCO) 7.5-325 MG tablet [id number] She's wondering if Dr. [name] will be able to do the refills till she comes to see her in February.

## 2015-11-06 NOTE — Telephone Encounter (Signed)
appt made

## 2015-11-06 NOTE — Telephone Encounter (Signed)
Can you call pt and schedule for new pt appt before Feb.

## 2015-11-16 ENCOUNTER — Ambulatory Visit (INDEPENDENT_AMBULATORY_CARE_PROVIDER_SITE_OTHER): Payer: 59 | Admitting: Internal Medicine

## 2015-11-16 ENCOUNTER — Encounter: Payer: Self-pay | Admitting: Internal Medicine

## 2015-11-16 VITALS — BP 146/108 | HR 88 | Temp 98.2°F | Resp 16 | Wt 138.0 lb

## 2015-11-16 DIAGNOSIS — E559 Vitamin D deficiency, unspecified: Secondary | ICD-10-CM | POA: Diagnosis not present

## 2015-11-16 DIAGNOSIS — I10 Essential (primary) hypertension: Secondary | ICD-10-CM | POA: Diagnosis not present

## 2015-11-16 DIAGNOSIS — IMO0002 Reserved for concepts with insufficient information to code with codable children: Secondary | ICD-10-CM

## 2015-11-16 DIAGNOSIS — K219 Gastro-esophageal reflux disease without esophagitis: Secondary | ICD-10-CM

## 2015-11-16 DIAGNOSIS — G43709 Chronic migraine without aura, not intractable, without status migrainosus: Secondary | ICD-10-CM | POA: Diagnosis not present

## 2015-11-16 DIAGNOSIS — F329 Major depressive disorder, single episode, unspecified: Secondary | ICD-10-CM

## 2015-11-16 DIAGNOSIS — F32A Depression, unspecified: Secondary | ICD-10-CM

## 2015-11-16 NOTE — Assessment & Plan Note (Signed)
?   Controlled  BP Readings from Last 3 Encounters:  11/16/15 146/108  10/20/15 122/84  03/18/15 138/100    She will start monitoring daily If not controlled at home will adjust meds

## 2015-11-16 NOTE — Assessment & Plan Note (Signed)
History of ulcers for unknown reason Continue daily medication

## 2015-11-16 NOTE — Progress Notes (Signed)
Pre visit review using our clinic review tool, if applicable. No additional management support is needed unless otherwise documented below in the visit note. 

## 2015-11-16 NOTE — Assessment & Plan Note (Signed)
Follows with neurology-Dr. Sima Matas Chronic daily headaches On Topamax taking Imitrex as needed and hydrocodone if needed I will prescribe the vicodin so she does not have to travel to her neurologists office as frequently. She does see her neurologist every 3 months

## 2015-11-16 NOTE — Patient Instructions (Addendum)
  We have reviewed your prior records including labs and tests today.  No immunizations administered today.   Medications reviewed and updated.  No changes recommended at this time.  Start monitoring your blood pressure at home regularly.    Please schedule followup in 6 months

## 2015-11-16 NOTE — Assessment & Plan Note (Signed)
Chronic depression Feels more depressed since the death of her brother and 03/24/2013 Taking Effexor 225 mg daily-thinks it's working, but still feels slightly depressed Declined referral to a therapist

## 2015-11-16 NOTE — Progress Notes (Signed)
Subjective:    Patient ID: Michaela Rios, female    DOB: [birth year], 64 y.o.   MRN: [id number]  HPI She is here to establish with a new pcp.   Migraine headaches:  She has chornic migraines and has a headache almost daily.  Out of one month she has 17 more severe headaches.  She sees a neurologist every three months, Dr Joretta Bachelor.  She has tried and done everything for the migraines.  She has been getting the vicodin drom her prior pcp so she would not have to travel to the neurologists office months. She takes the vicodin only as needed and uses less than 30 in a month.  She always wants to have it on hand.      Hypertension: She is taking her medication daily. She is compliant with a low sodium diet.  She denies chest pain, palpitations, edema, shortness of breath and lightheadedness. She is not exercising regularly.  She does not monitor her blood pressure at home.      Medications and allergies reviewed with patient and updated if appropriate.  Patient Active Problem List   Diagnosis Date Noted  . VITAMIN D DEFICIENCY 06/08/2009  . Depression 01/07/2009  . PEPTIC ULCER DISEASE, CHRONIC 01/07/2009  . OTHER OBSTRUCTION OF DUODENUM 01/07/2009  . HIATAL HERNIA 01/07/2009  . IRRITABLE BOWEL SYNDROME 01/07/2009  . DEGENERATIVE JOINT DISEASE 01/07/2009  . ANEMIA, HX OF 01/07/2009  . PERIPHERAL VASCULAR DISEASE 06/09/2008  . PULMONARY NODULE 06/09/2008  . ANXIETY 04/01/2008  . Essential hypertension 04/01/2008  . Allergic rhinitis 04/01/2008  . GERD 04/01/2008  . LOW BACK PAIN SYNDROME 04/01/2008  . Chronic migraine 04/01/2008    Current Outpatient Prescriptions on File Prior to Visit  Medication Sig Dispense Refill  . ALPRAZolam (XANAX) 0.5 MG tablet Take 1 tablet (0.5 mg total) by mouth 3 (three) times daily as needed for anxiety. 90 tablet 5  . calcium-vitamin D (OSCAL 500/200 D-3) 500-200 MG-UNIT per tablet Take 1 tablet by mouth twice daily     . clidinium-chlordiazePOXIDE  (LIBRAX) 5-2.5 MG per capsule Take 1 capsule by mouth 2 (two) times daily as needed. 180 capsule 0  . cyclobenzaprine (FLEXERIL) 10 MG tablet Take 10 mg by mouth.    . EPINEPHrine (EPIPEN 2-PAK) 0.3 mg/0.3 mL IJ SOAJ injection Inject 0.3 mLs (0.3 mg total) into the muscle once. 2 Device 1  . estradiol (VIVELLE-DOT) 0.025 MG/24HR Place 1 patch onto the skin 2 (two) times a week. 8 patch 12  . felodipine (PLENDIL) 5 MG 24 hr tablet Take 1 tablet (5 mg total) by mouth daily. 90 tablet 3  . fluticasone (FLONASE) 50 MCG/ACT nasal spray Place 2 sprays into both nostrils daily. 16 g 6  . furosemide (LASIX) 20 MG tablet Take 1 tablet (20 mg total) by mouth daily. 90 tablet 3  . HYDROcodone-acetaminophen (NORCO) 7.5-325 MG tablet Take 1 tablet by mouth 2 (two) times daily as needed for moderate pain. This is a 1 month supply. 30 tablet 0  . meclizine (ANTIVERT) 25 MG tablet Take 1 tablet by mouth every 6 hours as needed for dizziness 90 tablet 3  . metoprolol succinate (TOPROL-XL) 25 MG 24 hr tablet Take 1 tablet (25 mg total) by mouth daily. Take with 50mg  tab for total 75mg  daily 90 tablet 3  . metoprolol succinate (TOPROL-XL) 50 MG 24 hr tablet Take 1 tablet (50 mg total) by mouth daily. Take with or immediately following a meal. 90 tablet 3  .  omeprazole (PRILOSEC) 40 MG capsule TAKE 1 CAPSULE DAILY 90 capsule 3  . sulfamethoxazole-trimethoprim (BACTRIM) 400-80 MG tablet Take 1 tablet by mouth 2 (two) times daily. 14 tablet 0  . SUMAtriptan (IMITREX) 100 MG tablet Take 1 tablet by mouth  every 2 hours as needed for migraine. Max 2 tablets in  24 hours.    . SUMAtriptan (IMITREX) 20 MG/ACT nasal spray Place 1 spray into the nose.    Marland Kitchen tiZANidine (ZANAFLEX) 4 MG tablet     . topiramate (TOPAMAX) 50 MG tablet Take 1 tablet (50 mg total) by mouth 2 (two) times daily. Take 2 tablets daily 180 tablet 3  . venlafaxine XR (EFFEXOR-XR) 75 MG 24 hr capsule Take 225 mg by mouth.    . Vitamin D, Ergocalciferol,  (DRISDOL) 50000 UNITS CAPS capsule Take 1 capsule (50,000 Units total) by mouth every 7 (seven) days. 12 capsule 3  . zolpidem (AMBIEN) 10 MG tablet Take 1 tablet (10 mg total) by mouth at bedtime as needed for sleep. 15 tablet 1   No current facility-administered medications on file prior to visit.    Past Medical History  Diagnosis Date  . Hypertension   . Peripheral vascular disease ([place])   . GERD (gastroesophageal reflux disease)   . IBS (irritable bowel syndrome)   . Low back pain syndrome   . Chronic migraine     follows with neuro for same  . Anxiety   . Vitamin D deficiency   . Peptic ulcer disease 2003, 12/2012  . Esophagitis   . MRSA (methicillin resistant Staphylococcus aureus) infection 04/17/12    left torso  . Anemia   . Arthritis   . Cataract   . Clotting disorder ([facility])     clot in ovarian vein- hx    Past Surgical History  Procedure Laterality Date  . Tonsillectomy and adenoidectomy      as a child  . Tubal ligation  1984  . Abdominal hysterectomy    . Pud surgery w/duod sticture-plasty and vagotomy  2003    Dr. Marlou Starks  . Laparoscopic bilateral salpingo oopherectomy      Social History   Social History  . Marital Status: Married    Spouse Name: Levada Dy  . Number of Children: 1  . Years of Education: N/A   Occupational History  . [employer]    Social History Main Topics  . Smoking status: Never Smoker   . Smokeless tobacco: Never Used  . Alcohol Use: Yes     Comment: rare  . Drug Use: No  . Sexual Activity: Not Asked   Other Topics Concern  . None   Social History Narrative   Lives with spouse    Grown son in [place], 2 g kids    Review of Systems  Constitutional: Negative for fever and chills.  Respiratory: Positive for cough (sometimes, dry). Negative for shortness of breath and wheezing.   Cardiovascular: Negative for chest pain, palpitations and leg swelling.  Gastrointestinal: Positive for nausea (with headaches). Negative for  abdominal pain.       No GERD  Neurological: Positive for dizziness (related to migraines) and headaches (chronic).  Psychiatric/Behavioral: Positive for dysphoric mood. The patient is nervous/anxious.        Objective:   Filed Vitals:   11/16/15 1351  BP: 146/108  Pulse: 88  Temp: 98.2 F (36.8 C)  Resp: 16   Filed Weights   11/16/15 1351  Weight: 138 lb (62.596 kg)  Body mass index is 24.06 kg/(m^2).   Physical Exam Constitutional: Appears well-developed and well-nourished. No distress.  Neck: Neck supple. No tracheal deviation present. No thyromegaly present.  No carotid bruit. No cervical adenopathy.   Cardiovascular: Normal rate, regular rhythm and normal heart sounds.   No murmur heard. Pulmonary/Chest: Effort normal and breath sounds normal. No respiratory distress. No wheezes.  Musculoskeletal: No edema.      Assessment & Plan:   See Problem List.

## 2015-11-16 NOTE — Assessment & Plan Note (Signed)
Was taking 50,000 units weekly - she will finish what she has and then discontinue this dose Start 3000 units daily Check vitamin D level next visit

## 2015-12-17 ENCOUNTER — Telehealth: Payer: Self-pay | Admitting: *Deleted

## 2015-12-17 MED ORDER — HYDROCODONE-ACETAMINOPHEN 7.5-325 MG PO TABS: 1.0000 | ORAL_TABLET | Freq: Two times a day (BID) | ORAL | Status: DC | PRN

## 2015-12-17 NOTE — Telephone Encounter (Signed)
Received call pt is requesting refill on her hydrocodone...Johny Chess

## 2015-12-17 NOTE — Telephone Encounter (Signed)
rx printed

## 2015-12-18 NOTE — Telephone Encounter (Signed)
Called pt no answer LMOM rx ready for pick-up.../lmb 

## 2016-01-06 ENCOUNTER — Other Ambulatory Visit: Payer: Self-pay | Admitting: Internal Medicine

## 2016-01-07 NOTE — Telephone Encounter (Signed)
Pts last OV 11/16/15. Please advise.

## 2016-01-07 NOTE — Telephone Encounter (Signed)
RX faxed to pharm  

## 2016-01-20 ENCOUNTER — Ambulatory Visit: Payer: 59 | Admitting: Internal Medicine

## 2016-02-04 ENCOUNTER — Other Ambulatory Visit: Payer: Self-pay

## 2016-02-04 MED ORDER — HYDROCODONE-ACETAMINOPHEN 7.5-325 MG PO TABS: 1.0000 | ORAL_TABLET | Freq: Two times a day (BID) | ORAL | Status: DC | PRN

## 2016-02-04 NOTE — Telephone Encounter (Signed)
rx printed

## 2016-02-04 NOTE — Telephone Encounter (Signed)
Pt lm on triage rq rf of hydrocodone. Please advise at your convenience.

## 2016-02-04 NOTE — Telephone Encounter (Signed)
Left detailed mess informing pt she can p/u script today.

## 2016-03-09 ENCOUNTER — Other Ambulatory Visit: Payer: Self-pay | Admitting: Internal Medicine

## 2016-03-15 ENCOUNTER — Other Ambulatory Visit: Payer: Self-pay

## 2016-03-15 NOTE — Telephone Encounter (Signed)
Pt called and left a message rq rf of alprazolam.  Pended for review.

## 2016-03-16 ENCOUNTER — Telehealth: Payer: Self-pay | Admitting: *Deleted

## 2016-03-16 MED ORDER — HYDROCODONE-ACETAMINOPHEN 7.5-325 MG PO TABS: 1.0000 | ORAL_TABLET | Freq: Two times a day (BID) | ORAL | Status: DC | PRN

## 2016-03-16 MED ORDER — ALPRAZOLAM 0.5 MG PO TABS: ORAL_TABLET | ORAL | Status: DC

## 2016-03-16 NOTE — Telephone Encounter (Signed)
Ok, rx's printed

## 2016-03-16 NOTE — Telephone Encounter (Signed)
Received call pt states she called on Monday requesting refills on her Hydrocodone & Alprazolam. Haven't receive call back. Is this ok to refill.Marland KitchenJohny Rios

## 2016-03-17 NOTE — Telephone Encounter (Signed)
Called pt no answer LMOM rx's ready for pick-up.../lmb 

## 2016-04-20 ENCOUNTER — Telehealth: Payer: Self-pay | Admitting: *Deleted

## 2016-04-20 MED ORDER — HYDROCODONE-ACETAMINOPHEN 7.5-325 MG PO TABS: 1.0000 | ORAL_TABLET | Freq: Two times a day (BID) | ORAL | Status: DC | PRN

## 2016-04-20 NOTE — Telephone Encounter (Signed)
rx for hydrocodone printed.  Have her take 1000 units of vitamin d daily.

## 2016-04-20 NOTE — Telephone Encounter (Signed)
Left msg on triage requesting refill on her pain med Hydrocodone, and also need to know what dosage she need to be taking for her vitamin D otc...Johny Chess

## 2016-04-21 NOTE — Telephone Encounter (Signed)
Called pt no answer LMOM w/MD response. Place rx up front for pick-up...Michaela Rios

## 2016-05-09 ENCOUNTER — Other Ambulatory Visit (INDEPENDENT_AMBULATORY_CARE_PROVIDER_SITE_OTHER): Payer: 59

## 2016-05-09 ENCOUNTER — Encounter: Payer: Self-pay | Admitting: Internal Medicine

## 2016-05-09 ENCOUNTER — Ambulatory Visit (INDEPENDENT_AMBULATORY_CARE_PROVIDER_SITE_OTHER): Payer: 59 | Admitting: Internal Medicine

## 2016-05-09 VITALS — BP 152/110 | HR 86 | Temp 97.9°F | Resp 16 | Wt 143.0 lb

## 2016-05-09 DIAGNOSIS — G43709 Chronic migraine without aura, not intractable, without status migrainosus: Secondary | ICD-10-CM

## 2016-05-09 DIAGNOSIS — F419 Anxiety disorder, unspecified: Secondary | ICD-10-CM

## 2016-05-09 DIAGNOSIS — I10 Essential (primary) hypertension: Secondary | ICD-10-CM

## 2016-05-09 DIAGNOSIS — F32A Depression, unspecified: Secondary | ICD-10-CM

## 2016-05-09 DIAGNOSIS — IMO0002 Reserved for concepts with insufficient information to code with codable children: Secondary | ICD-10-CM

## 2016-05-09 DIAGNOSIS — E559 Vitamin D deficiency, unspecified: Secondary | ICD-10-CM

## 2016-05-09 DIAGNOSIS — F329 Major depressive disorder, single episode, unspecified: Secondary | ICD-10-CM

## 2016-05-09 DIAGNOSIS — K219 Gastro-esophageal reflux disease without esophagitis: Secondary | ICD-10-CM | POA: Diagnosis not present

## 2016-05-09 LAB — TSH: TSH: 1.83 u[IU]/mL (ref 0.35–4.50)

## 2016-05-09 LAB — COMPREHENSIVE METABOLIC PANEL
ALBUMIN: 4 g/dL (ref 3.5–5.2)
ALK PHOS: 79 U/L (ref 39–117)
ALT: 19 U/L (ref 0–35)
AST: 21 U/L (ref 0–37)
BILIRUBIN TOTAL: 0.3 mg/dL (ref 0.2–1.2)
BUN: 25 mg/dL — AB (ref 6–23)
CO2: 26 mEq/L (ref 19–32)
CREATININE: 1.06 mg/dL (ref 0.40–1.20)
Calcium: 9 mg/dL (ref 8.4–10.5)
Chloride: 107 mEq/L (ref 96–112)
GFR: 55.19 mL/min — ABNORMAL LOW (ref >60.00)
GLUCOSE: 83 mg/dL (ref 70–99)
POTASSIUM: 3.6 meq/L (ref 3.5–5.1)
SODIUM: 140 meq/L (ref 135–145)
TOTAL PROTEIN: 7.4 g/dL (ref 6.0–8.3)

## 2016-05-09 LAB — CBC WITH DIFFERENTIAL/PLATELET
BASOS ABS: 0 10*3/uL (ref 0.0–0.1)
Basophils Relative: 0.6 % (ref 0.0–3.0)
EOS ABS: 0.3 10*3/uL (ref 0.0–0.7)
Eosinophils Relative: 5.9 % — ABNORMAL HIGH (ref 0.0–5.0)
HCT: 37.2 % (ref 36.0–46.0)
HEMOGLOBIN: 12.3 g/dL (ref 12.0–15.0)
LYMPHS ABS: 1.2 10*3/uL (ref 0.7–4.0)
Lymphocytes Relative: 21.9 % (ref 12.0–46.0)
MCHC: 33.1 g/dL (ref 30.0–36.0)
MCV: 88.6 fl (ref 78.0–100.0)
MONO ABS: 0.4 10*3/uL (ref 0.1–1.0)
Monocytes Relative: 7.5 % (ref 3.0–12.0)
NEUTROS PCT: 64.1 % (ref 43.0–77.0)
Neutro Abs: 3.5 10*3/uL (ref 1.4–7.7)
Platelets: 199 10*3/uL (ref 150.0–400.0)
RBC: 4.2 Mil/uL (ref 3.87–5.11)
RDW: 14.3 % (ref 11.5–15.5)
WBC: 5.4 10*3/uL (ref 4.0–10.5)

## 2016-05-09 LAB — HEMOGLOBIN A1C: HEMOGLOBIN A1C: 5.8 % (ref 4.6–6.5)

## 2016-05-09 MED ORDER — HYDROCODONE-ACETAMINOPHEN 7.5-325 MG PO TABS: 1.0000 | ORAL_TABLET | Freq: Three times a day (TID) | ORAL | Status: DC | PRN

## 2016-05-09 MED ORDER — ESTRADIOL 0.025 MG/24HR TD PTTW: 1.0000 | MEDICATED_PATCH | TRANSDERMAL | Status: DC

## 2016-05-09 MED ORDER — TOPIRAMATE 50 MG PO TABS: 50.0000 mg | ORAL_TABLET | Freq: Two times a day (BID) | ORAL | Status: DC

## 2016-05-09 MED ORDER — METOPROLOL SUCCINATE ER 100 MG PO TB24
100.0000 mg | ORAL_TABLET | Freq: Every day | ORAL | Status: DC
Start: 2016-05-09 — End: 2017-05-12

## 2016-05-09 MED ORDER — OMEPRAZOLE 40 MG PO CPDR: DELAYED_RELEASE_CAPSULE | ORAL | Status: DC

## 2016-05-09 NOTE — Assessment & Plan Note (Signed)
Taking effexor daily, uses xanax daily - three times a day Fairly controlled Continue above

## 2016-05-09 NOTE — Assessment & Plan Note (Signed)
Blood pressure is not controlled Increase metoprolol from 75 mg daily to 100 mg daily  She will continue to monitor at home and if it is not contolled  - will let me know-we will consider increasing felodipine if that is the case cmp

## 2016-05-09 NOTE — Patient Instructions (Addendum)
  Test(s) ordered today. Your results will be released to [place] (or called to you) after review, usually within 72hours after test completion. If any changes need to be made, you will be notified at that same time.   Medications reviewed and updated.  Changes include increasing the metoprolol to 100 mg daily.  If your BP is still elevated at home, please call so we can further adjust your medication.  Start the daily vitamin D.  Try to start exercising regularly.   Your prescription(s) have been submitted to your pharmacy. Please take as directed and contact our office if you believe you are having problem(s) with the medication(s).   Please followup in 6 months

## 2016-05-09 NOTE — Progress Notes (Signed)
Subjective:    Patient ID: Michaela Rios, female    DOB: [birth year], 65 y.o.   MRN: [id number]  HPI She is here for follow up.  Hypertension: She is taking her medication daily. She is compliant with a low sodium diet.  She denies chest pain, palpitations, edema, shortness of breath and regular headaches. She is not exercising regularly.  She does monitor her blood pressure at home and it has been high.    GERD, h/o PUD:  She is taking her medication daily as prescribed.  She denies any GERD symptoms and feels her GERD is well controlled.   Migraine headaches:  She suffers from migraine headaches and follows regularly with neurology. She is taking her Topamax daily and Imitrex as needed. She takes hydrocodone only if needed, which I have agreed to prescribe prescription that she does not have to see her neurologist as regularly because she is farther away.  Anxiety, depression: She is taking the Effexor daily as prescribed. She uses the Xanax daily  - take three a day.   She has gained 10 lbs since she was here last.  She denies any changes in medication, activity and diet.   She is currently not exercising regulary.  Medications and allergies reviewed with patient and updated if appropriate.  Patient Active Problem List   Diagnosis Date Noted  . Vitamin D deficiency 06/08/2009  . Depression 01/07/2009  . PEPTIC ULCER DISEASE, CHRONIC 01/07/2009  . OTHER OBSTRUCTION OF DUODENUM 01/07/2009  . HIATAL HERNIA 01/07/2009  . IRRITABLE BOWEL SYNDROME 01/07/2009  . DEGENERATIVE JOINT DISEASE 01/07/2009  . ANEMIA, HX OF 01/07/2009  . PERIPHERAL VASCULAR DISEASE 06/09/2008  . PULMONARY NODULE 06/09/2008  . Anxiety 04/01/2008  . Essential hypertension 04/01/2008  . Allergic rhinitis 04/01/2008  . GERD 04/01/2008  . LOW BACK PAIN SYNDROME 04/01/2008  . Chronic migraine 04/01/2008    Current Outpatient Prescriptions on File Prior to Visit  Medication Sig Dispense Refill  .  ALPRAZolam (XANAX) 0.5 MG tablet TAKE 1 TABLET BY MOUTH 3 TIMES A DAY AS NEEDED FOR ANXIETY 90 tablet 2  . calcium-vitamin D (OSCAL 500/200 D-3) 500-200 MG-UNIT per tablet Take 1 tablet by mouth twice daily     . clidinium-chlordiazePOXIDE (LIBRAX) 5-2.5 MG per capsule Take 1 capsule by mouth 2 (two) times daily as needed. 180 capsule 0  . cyclobenzaprine (FLEXERIL) 10 MG tablet Take 10 mg by mouth.    . EPINEPHrine (EPIPEN 2-PAK) 0.3 mg/0.3 mL IJ SOAJ injection Inject 0.3 mLs (0.3 mg total) into the muscle once. 2 Device 1  . estradiol (VIVELLE-DOT) 0.025 MG/24HR Place 1 patch onto the skin 2 (two) times a week. 8 patch 12  . felodipine (PLENDIL) 5 MG 24 hr tablet Take 1 tablet (5 mg total) by mouth daily. 90 tablet 3  . fluticasone (FLONASE) 50 MCG/ACT nasal spray Place 2 sprays into both nostrils daily. 16 g 6  . furosemide (LASIX) 20 MG tablet Take 1 tablet by mouth  daily 90 tablet 1  . HYDROcodone-acetaminophen (NORCO) 7.5-325 MG tablet Take 1 tablet by mouth 2 (two) times daily as needed for moderate pain. This is a 1 month supply. 30 tablet 0  . meclizine (ANTIVERT) 25 MG tablet Take 1 tablet by mouth every 6 hours as needed for dizziness 90 tablet 3  . metoprolol succinate (TOPROL-XL) 25 MG 24 hr tablet Take 1 tablet (25 mg total) by mouth daily. Take with 50mg  tab for total 75mg  daily 90 tablet  3  . metoprolol succinate (TOPROL-XL) 50 MG 24 hr tablet Take 1 tablet (50 mg total) by mouth daily. Take with or immediately following a meal. 90 tablet 3  . omeprazole (PRILOSEC) 40 MG capsule Take 1 capsule by mouth  daily 90 capsule 1  . tiZANidine (ZANAFLEX) 4 MG tablet     . topiramate (TOPAMAX) 50 MG tablet Take 1 tablet (50 mg total) by mouth 2 (two) times daily. Take 2 tablets daily 180 tablet 3  . zolpidem (AMBIEN) 10 MG tablet Take 1 tablet (10 mg total) by mouth at bedtime as needed for sleep. 15 tablet 1  . SUMAtriptan (IMITREX) 100 MG tablet Take 1 tablet by mouth  every 2 hours as  needed for migraine. Max 2 tablets in  24 hours.    . SUMAtriptan (IMITREX) 20 MG/ACT nasal spray Place 1 spray into the nose.    . venlafaxine XR (EFFEXOR-XR) 75 MG 24 hr capsule Take 225 mg by mouth.     No current facility-administered medications on file prior to visit.    Past Medical History  Diagnosis Date  . Hypertension   . Peripheral vascular disease ([place])   . GERD (gastroesophageal reflux disease)   . IBS (irritable bowel syndrome)   . Low back pain syndrome   . Chronic migraine     follows with neuro for same  . Anxiety   . Vitamin D deficiency   . Peptic ulcer disease 2003, 12/2012  . Esophagitis   . MRSA (methicillin resistant Staphylococcus aureus) infection 04/17/12    left torso  . Anemia   . Arthritis   . Cataract   . Clotting disorder ([facility])     clot in ovarian vein- hx    Past Surgical History  Procedure Laterality Date  . Tonsillectomy and adenoidectomy      as a child  . Tubal ligation  1984  . Abdominal hysterectomy    . Pud surgery w/duod sticture-plasty and vagotomy  2003    Dr. Marlou Starks  . Laparoscopic bilateral salpingo oopherectomy      Social History   Social History  . Marital Status: Married    Spouse Name: Levada Dy  . Number of Children: 1  . Years of Education: N/A   Occupational History  . [employer]    Social History Main Topics  . Smoking status: Never Smoker   . Smokeless tobacco: Never Used  . Alcohol Use: Yes     Comment: rare  . Drug Use: No  . Sexual Activity: Not Asked   Other Topics Concern  . None   Social History Narrative   Lives with spouse    Grown son in [place], 2 g kids    Family History  Problem Relation Age of Onset  . Aneurysm Father   . Prostate cancer Brother   . Colon cancer Neg Hx   . Esophageal cancer Neg Hx   . Rectal cancer Neg Hx   . Stomach cancer Neg Hx   . Lung cancer Brother     Review of Systems  Constitutional: Positive for unexpected weight change (10 lb weight gain). Negative for  fever and appetite change.  Respiratory: Positive for shortness of breath. Negative for cough and wheezing.   Cardiovascular: Negative for chest pain, palpitations and leg swelling.  Neurological: Positive for dizziness, light-headedness and headaches.       Objective:   Filed Vitals:   05/09/16 0845  BP: 152/110  Pulse: 86  Temp: 97.9  F (36.6 C)  Resp: 16   Filed Weights   05/09/16 0845  Weight: 143 lb (64.864 kg)   Body mass index is 24.93 kg/(m^2).   Physical Exam Constitutional: Appears well-developed and well-nourished. No distress.  Neck: Neck supple. No tracheal deviation present. No thyromegaly present.  No carotid bruit. No cervical adenopathy.   Cardiovascular: Normal rate, regular rhythm and normal heart sounds.   No murmur heard.  No edema Pulmonary/Chest: Effort normal and breath sounds normal. No respiratory distress. No wheezes.  Abdomen: soft, non-tender, non-distended, no HSM Psych: normal mood and affect     Assessment & Plan:   Stressed regular exercise to help maintain weight  See Problem List for Assessment and Plan of chronic medical problems.  F/u in 6 months

## 2016-05-09 NOTE — Assessment & Plan Note (Signed)
GERD controlled Continue daily medication  

## 2016-05-09 NOTE — Assessment & Plan Note (Signed)
Controlled, stable Continue current dose of medication - effexor  

## 2016-05-09 NOTE — Assessment & Plan Note (Signed)
Following with neuro topamax daily imitrex prn Hydrocodone only prn  -she tries to not take it Continue above

## 2016-05-09 NOTE — Assessment & Plan Note (Signed)
Completed high dose vitamin d Will check vitamin d level Will start otc vitamin d daily

## 2016-05-09 NOTE — Progress Notes (Signed)
Pre visit review using our clinic review tool, if applicable. No additional management support is needed unless otherwise documented below in the visit note. 

## 2016-05-12 ENCOUNTER — Encounter: Payer: Self-pay | Admitting: Internal Medicine

## 2016-05-12 ENCOUNTER — Other Ambulatory Visit: Payer: Self-pay | Admitting: Emergency Medicine

## 2016-05-12 LAB — VITAMIN D 1,25 DIHYDROXY
VITAMIN D2 1, 25 (OH): 54 pg/mL
Vitamin D 1, 25 (OH)2 Total: 54 pg/mL (ref 18–72)
Vitamin D3 1, 25 (OH)2: <8 pg/mL

## 2016-05-12 MED ORDER — TOPIRAMATE 50 MG PO TABS: 50.0000 mg | ORAL_TABLET | Freq: Two times a day (BID) | ORAL | Status: DC

## 2016-07-25 ENCOUNTER — Encounter: Payer: Self-pay | Admitting: Internal Medicine

## 2016-07-25 ENCOUNTER — Other Ambulatory Visit: Payer: Self-pay | Admitting: Internal Medicine

## 2016-07-25 NOTE — Telephone Encounter (Signed)
Refilled today

## 2016-07-25 NOTE — Telephone Encounter (Signed)
RX faxed to [facility] 

## 2016-08-10 LAB — HM MAMMOGRAPHY

## 2016-08-25 ENCOUNTER — Other Ambulatory Visit: Payer: Self-pay | Admitting: Internal Medicine

## 2016-08-25 MED ORDER — HYDROCODONE-ACETAMINOPHEN 7.5-325 MG PO TABS: 1.0000 | ORAL_TABLET | Freq: Three times a day (TID) | ORAL | 0 refills | Status: DC | PRN

## 2016-08-26 ENCOUNTER — Telehealth: Payer: Self-pay | Admitting: *Deleted

## 2016-08-26 NOTE — Telephone Encounter (Signed)
Pt left msg on triage yesterday afternoon requesting refill on her Hydrocodone. Refill has already been done. See email from yesterday MD sent to [facility]...Johny Chess

## 2016-08-29 ENCOUNTER — Other Ambulatory Visit: Payer: Self-pay | Admitting: Emergency Medicine

## 2016-08-29 NOTE — Telephone Encounter (Signed)
Pt notified by [facility] RX is ready for pick-up

## 2016-08-30 ENCOUNTER — Encounter: Payer: Self-pay | Admitting: Internal Medicine

## 2016-10-28 ENCOUNTER — Other Ambulatory Visit: Payer: Self-pay | Admitting: Internal Medicine

## 2016-10-28 NOTE — Telephone Encounter (Signed)
RX faxed to [facility] 

## 2016-10-30 ENCOUNTER — Other Ambulatory Visit: Payer: Self-pay | Admitting: Internal Medicine

## 2016-11-08 ENCOUNTER — Encounter: Payer: Self-pay | Admitting: Internal Medicine

## 2016-11-08 ENCOUNTER — Other Ambulatory Visit (INDEPENDENT_AMBULATORY_CARE_PROVIDER_SITE_OTHER): Payer: 59

## 2016-11-08 ENCOUNTER — Ambulatory Visit (INDEPENDENT_AMBULATORY_CARE_PROVIDER_SITE_OTHER)
Admission: RE | Admit: 2016-11-08 | Discharge: 2016-11-08 | Disposition: A | Discharge status: Home / Self Care | Payer: 59 | Source: Ambulatory Visit | Attending: Internal Medicine | Admitting: Internal Medicine

## 2016-11-08 ENCOUNTER — Ambulatory Visit (INDEPENDENT_AMBULATORY_CARE_PROVIDER_SITE_OTHER): Payer: 59 | Admitting: Internal Medicine

## 2016-11-08 VITALS — BP 126/86 | HR 80 | Temp 98.1°F | Resp 16 | Ht 64.0 in | Wt 148.0 lb

## 2016-11-08 DIAGNOSIS — IMO0002 Reserved for concepts with insufficient information to code with codable children: Secondary | ICD-10-CM

## 2016-11-08 DIAGNOSIS — K219 Gastro-esophageal reflux disease without esophagitis: Secondary | ICD-10-CM

## 2016-11-08 DIAGNOSIS — I10 Essential (primary) hypertension: Secondary | ICD-10-CM | POA: Diagnosis not present

## 2016-11-08 DIAGNOSIS — R51 Headache: Secondary | ICD-10-CM | POA: Diagnosis not present

## 2016-11-08 DIAGNOSIS — R7303 Prediabetes: Secondary | ICD-10-CM

## 2016-11-08 DIAGNOSIS — R519 Headache, unspecified: Secondary | ICD-10-CM

## 2016-11-08 DIAGNOSIS — Z0000 Encounter for general adult medical examination without abnormal findings: Secondary | ICD-10-CM

## 2016-11-08 DIAGNOSIS — G43709 Chronic migraine without aura, not intractable, without status migrainosus: Secondary | ICD-10-CM

## 2016-11-08 LAB — CBC WITH DIFFERENTIAL/PLATELET
BASOS ABS: 0 10*3/uL (ref 0.0–0.1)
Basophils Relative: 0.7 % (ref 0.0–3.0)
EOS PCT: 6 % — AB (ref 0.0–5.0)
Eosinophils Absolute: 0.4 10*3/uL (ref 0.0–0.7)
HCT: 35.9 % — ABNORMAL LOW (ref 36.0–46.0)
HEMOGLOBIN: 12 g/dL (ref 12.0–15.0)
Lymphocytes Relative: 18 % (ref 12.0–46.0)
Lymphs Abs: 1.2 10*3/uL (ref 0.7–4.0)
MCHC: 33.4 g/dL (ref 30.0–36.0)
MCV: 89.3 fl (ref 78.0–100.0)
MONOS PCT: 10.8 % (ref 3.0–12.0)
Monocytes Absolute: 0.7 10*3/uL (ref 0.1–1.0)
Neutro Abs: 4.2 10*3/uL (ref 1.4–7.7)
Neutrophils Relative %: 64.5 % (ref 43.0–77.0)
Platelets: 205 10*3/uL (ref 150.0–400.0)
RBC: 4.02 Mil/uL (ref 3.87–5.11)
RDW: 14.9 % (ref 11.5–15.5)
WBC: 6.5 10*3/uL (ref 4.0–10.5)

## 2016-11-08 LAB — COMPREHENSIVE METABOLIC PANEL
ALBUMIN: 4.1 g/dL (ref 3.5–5.2)
ALK PHOS: 69 U/L (ref 39–117)
ALT: 16 U/L (ref 0–35)
AST: 18 U/L (ref 0–37)
BILIRUBIN TOTAL: 0.3 mg/dL (ref 0.2–1.2)
BUN: 31 mg/dL — AB (ref 6–23)
CO2: 27 mEq/L (ref 19–32)
Calcium: 9.5 mg/dL (ref 8.4–10.5)
Chloride: 106 mEq/L (ref 96–112)
Creatinine, Ser: 1.08 mg/dL (ref 0.40–1.20)
GFR: 53.93 mL/min — ABNORMAL LOW (ref >60.00)
GLUCOSE: 89 mg/dL (ref 70–99)
POTASSIUM: 3.9 meq/L (ref 3.5–5.1)
SODIUM: 141 meq/L (ref 135–145)
TOTAL PROTEIN: 7.5 g/dL (ref 6.0–8.3)

## 2016-11-08 LAB — HEMOGLOBIN A1C: HEMOGLOBIN A1C: 5.8 % (ref 4.6–6.5)

## 2016-11-08 LAB — LIPID PANEL
CHOLESTEROL: 150 mg/dL (ref 0–200)
HDL: 53 mg/dL (ref >39.00)
LDL Cholesterol: 77 mg/dL (ref 0–99)
NONHDL: 97.28
Total CHOL/HDL Ratio: 3
Triglycerides: 103 mg/dL (ref 0.0–149.0)
VLDL: 20.6 mg/dL (ref 0.0–40.0)

## 2016-11-08 LAB — TSH: TSH: 2.1 u[IU]/mL (ref 0.35–4.50)

## 2016-11-08 IMAGING — DX DG SINUSES COMPLETE 3+V
5 series · 5 of 5 positions shown · non-contrast
Comparison: None.

CLINICAL DATA: Left maxillary pressure, headache

EXAM:
PARANASAL SINUSES - COMPLETE 3 + VIEW

[waters]
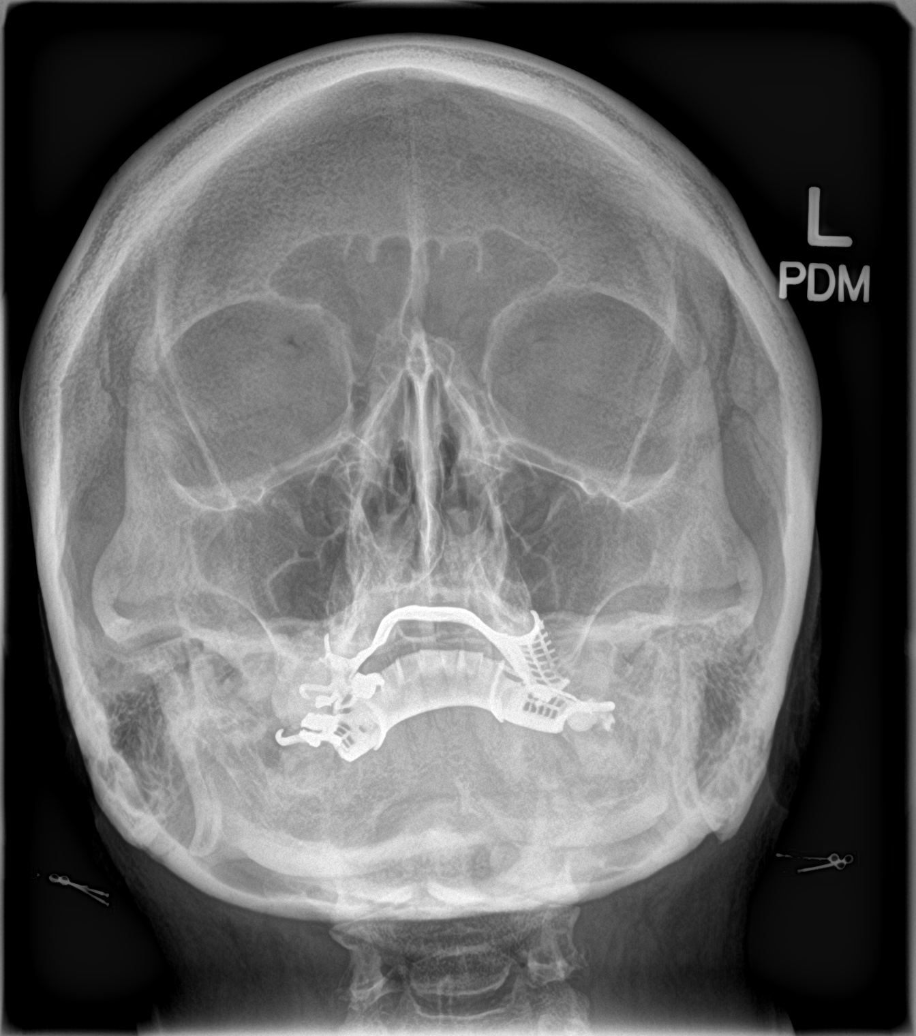

[[person_name]]
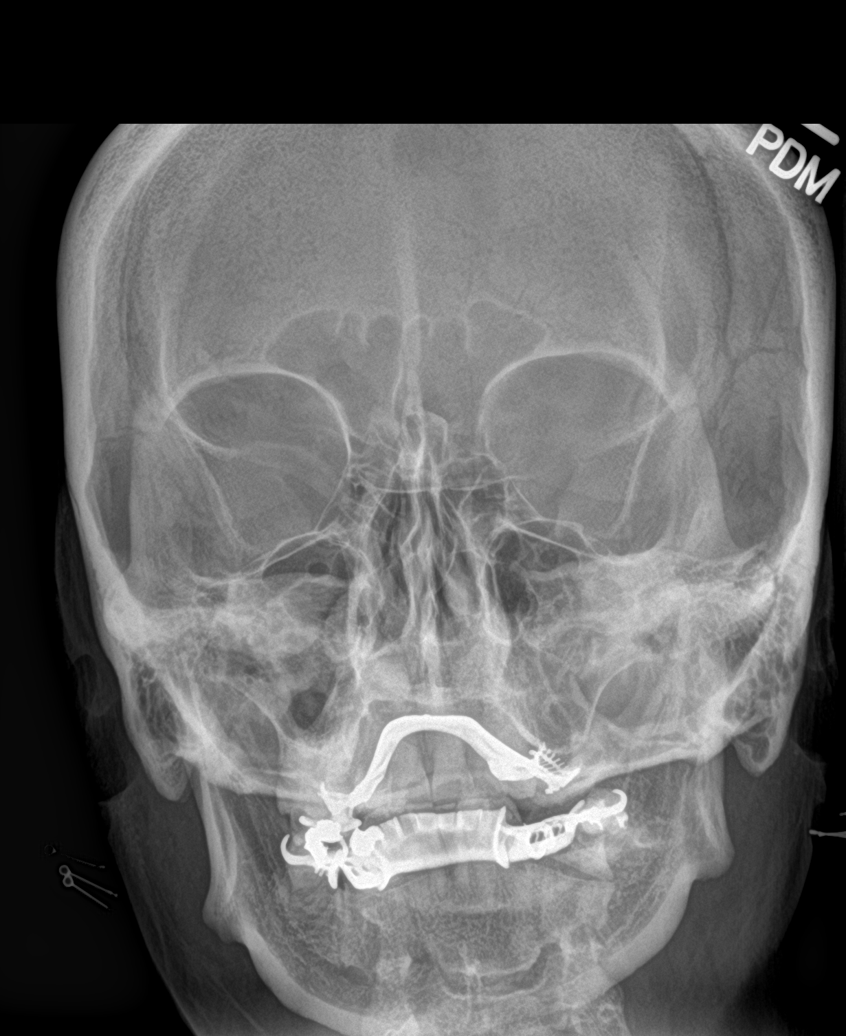

[lateral (1 of 2)]
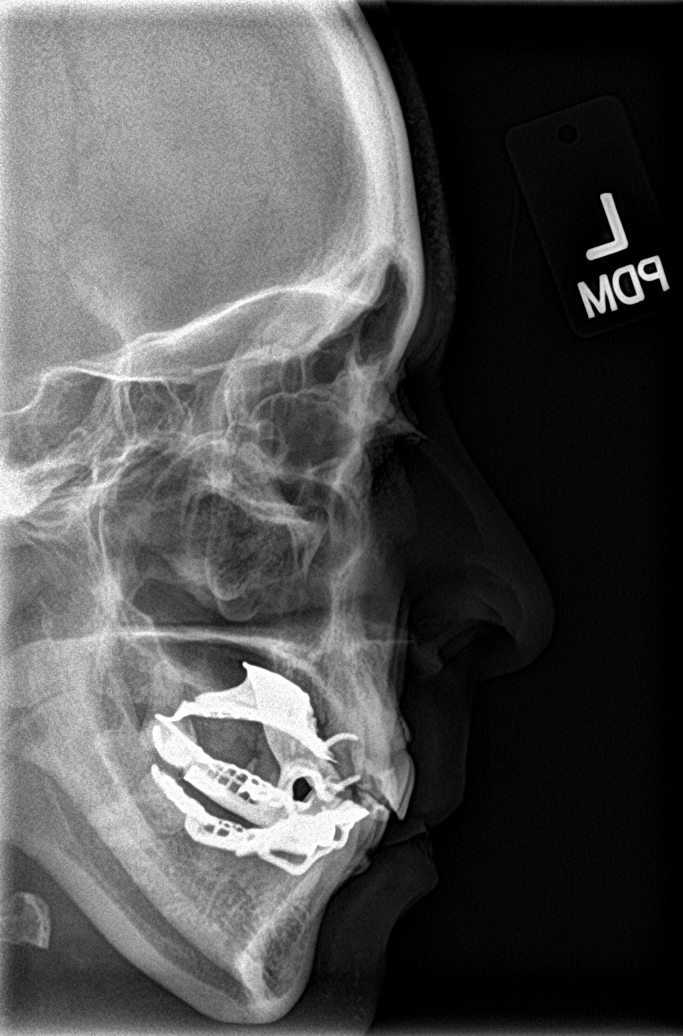

[smv]
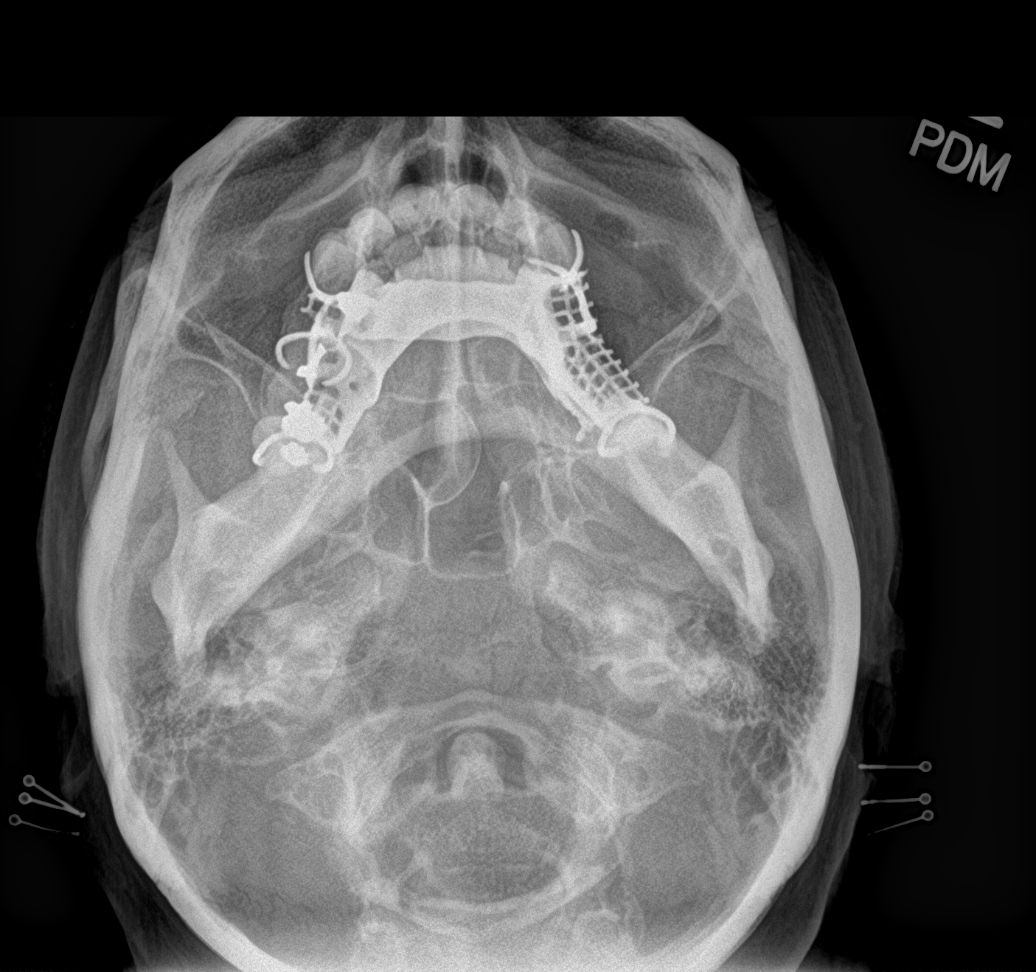

[lateral (2 of 2)]
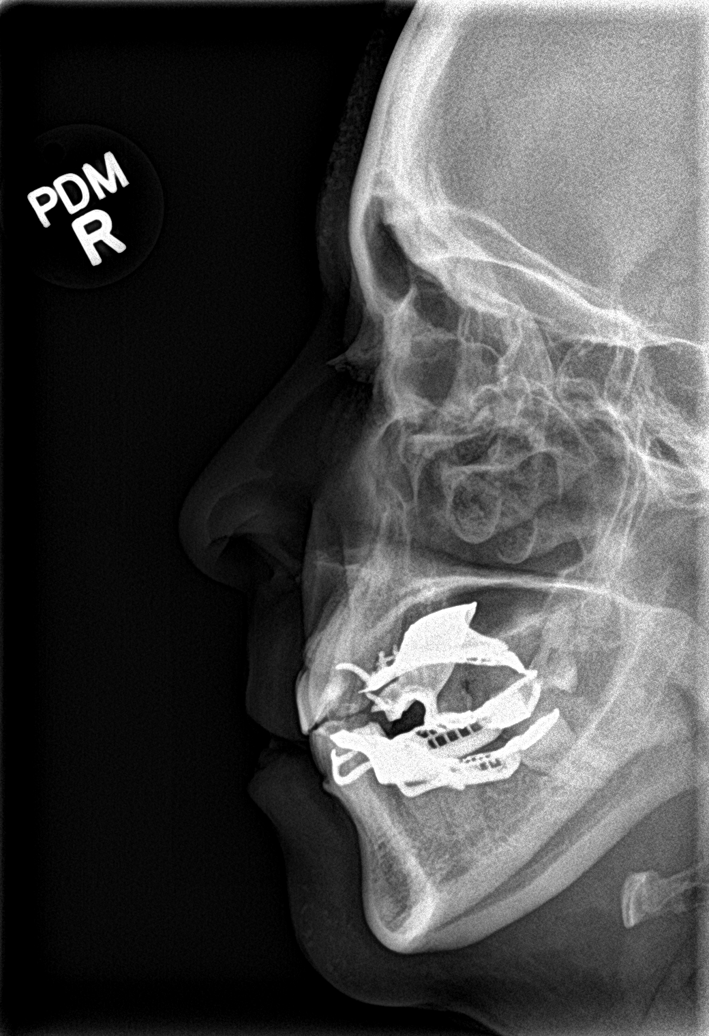

[5 of 5 positions shown; findings below may reference images not displayed]

FINDINGS: The paranasal sinus are aerated. There is no evidence of sinus
opacification air-fluid levels or mucosal thickening. No significant
bone abnormalities are seen.
IMPRESSION: Negative.

## 2016-11-08 MED ORDER — OMEPRAZOLE 20 MG PO CPDR: 20.0000 mg | DELAYED_RELEASE_CAPSULE | Freq: Every day | ORAL | 3 refills | Status: DC

## 2016-11-08 NOTE — Progress Notes (Signed)
Pre visit review using our clinic review tool, if applicable. No additional management support is needed unless otherwise documented below in the visit note. 

## 2016-11-08 NOTE — Assessment & Plan Note (Signed)
Check A1c Stressed low sugar/carb hydrate diet and regular exercise

## 2016-11-08 NOTE — Patient Instructions (Addendum)
Blood work and a sinus xray were ordered -   Test(s) ordered today. Your results will be released to [facility] (or called to you) after review, usually within 72hours after test completion. If any changes need to be made, you will be notified at that same time.  All other Health Maintenance issues reviewed.   All recommended immunizations and age-appropriate screenings are up-to-date or discussed.  No immunizations administered today.   Medications reviewed and updated.  Changes include decreasing omeprazole to 20 mg daily.  Your prescription(s) have been submitted to your pharmacy. Please take as directed and contact our office if you believe you are having problem(s) with the medication(s).   Please followup in 6 months  Health Maintenance, Female Introduction Adopting a healthy lifestyle and getting preventive care can go a long way to promote health and wellness. Talk with your health care provider about what schedule of regular examinations is right for you. This is a good chance for you to check in with your provider about disease prevention and staying healthy. In between checkups, there are plenty of things you can do on your own. Experts have done a lot of research about which lifestyle changes and preventive measures are most likely to keep you healthy. Ask your health care provider for more information. Weight and diet Eat a healthy diet  Be sure to include plenty of vegetables, fruits, low-fat dairy products, and lean protein.  Do not eat a lot of foods high in solid fats, added sugars, or salt.  Get regular exercise. This is one of the most important things you can do for your health.  Most adults should exercise for at least 150 minutes each week. The exercise should increase your heart rate and make you sweat (moderate-intensity exercise).  Most adults should also do strengthening exercises at least twice a week. This is in addition to the moderate-intensity  exercise. Maintain a healthy weight  Body mass index (BMI) is a measurement that can be used to identify possible weight problems. It estimates body fat based on height and weight. Your health care provider can help determine your BMI and help you achieve or maintain a healthy weight.  For females 55 years of age and older:  A BMI below 18.5 is considered underweight.  A BMI of 18.5 to 24.9 is normal.  A BMI of 25 to 29.9 is considered overweight.  A BMI of 30 and above is considered obese. Watch levels of cholesterol and blood lipids  You should start having your blood tested for lipids and cholesterol at 66 years of age, then have this test every 5 years.  You may need to have your cholesterol levels checked more often if:  Your lipid or cholesterol levels are high.  You are older than 66 years of age.  You are at high risk for heart disease. Cancer screening Lung Cancer  Lung cancer screening is recommended for adults 50-32 years old who are at high risk for lung cancer because of a history of smoking.  A yearly low-dose CT scan of the lungs is recommended for people who:  Currently smoke.  Have quit within the past 15 years.  Have at least a 30-pack-year history of smoking. A pack year is smoking an average of one pack of cigarettes a day for 1 year.  Yearly screening should continue until it has been 15 years since you quit.  Yearly screening should stop if you develop a health problem that would prevent you from having  lung cancer treatment. Breast Cancer  Practice breast self-awareness. This means understanding how your breasts normally appear and feel.  It also means doing regular breast self-exams. Let your health care provider know about any changes, no matter how small.  If you are in your 20s or 30s, you should have a clinical breast exam (CBE) by a health care provider every 1-3 years as part of a regular health exam.  If you are 58 or older, have a CBE  every year. Also consider having a breast X-ray (mammogram) every year.  If you have a family history of breast cancer, talk to your health care provider about genetic screening.  If you are at high risk for breast cancer, talk to your health care provider about having an MRI and a mammogram every year.  Breast cancer gene (BRCA) assessment is recommended for women who have family members with BRCA-related cancers. BRCA-related cancers include:  Breast.  Ovarian.  Tubal.  Peritoneal cancers.  Results of the assessment will determine the need for genetic counseling and BRCA1 and BRCA2 testing. Cervical Cancer  Your health care provider may recommend that you be screened regularly for cancer of the pelvic organs (ovaries, uterus, and vagina). This screening involves a pelvic examination, including checking for microscopic changes to the surface of your cervix (Pap test). You may be encouraged to have this screening done every 3 years, beginning at age 20.  For women ages 64-65, health care providers may recommend pelvic exams and Pap testing every 3 years, or they may recommend the Pap and pelvic exam, combined with testing for human papilloma virus (HPV), every 5 years. Some types of HPV increase your risk of cervical cancer. Testing for HPV may also be done on women of any age with unclear Pap test results.  Other health care providers may not recommend any screening for nonpregnant women who are considered low risk for pelvic cancer and who do not have symptoms. Ask your health care provider if a screening pelvic exam is right for you.  If you have had past treatment for cervical cancer or a condition that could lead to cancer, you need Pap tests and screening for cancer for at least 20 years after your treatment. If Pap tests have been discontinued, your risk factors (such as having a new sexual partner) need to be reassessed to determine if screening should resume. Some women have medical  problems that increase the chance of getting cervical cancer. In these cases, your health care provider may recommend more frequent screening and Pap tests. Colorectal Cancer  This type of cancer can be detected and often prevented.  Routine colorectal cancer screening usually begins at 66 years of age and continues through 66 years of age.  Your health care provider may recommend screening at an earlier age if you have risk factors for colon cancer.  Your health care provider may also recommend using home test kits to check for hidden blood in the stool.  A small camera at the end of a tube can be used to examine your colon directly (sigmoidoscopy or colonoscopy). This is done to check for the earliest forms of colorectal cancer.  Routine screening usually begins at age 71.  Direct examination of the colon should be repeated every 5-10 years through 66 years of age. However, you may need to be screened more often if early forms of precancerous polyps or small growths are found. Skin Cancer  Check your skin from head to toe regularly.  Tell your health care provider about any new moles or changes in moles, especially if there is a change in a mole's shape or color.  Also tell your health care provider if you have a mole that is larger than the size of a pencil eraser.  Always use sunscreen. Apply sunscreen liberally and repeatedly throughout the day.  Protect yourself by wearing long sleeves, pants, a wide-brimmed hat, and sunglasses whenever you are outside. Heart disease, diabetes, and high blood pressure  High blood pressure causes heart disease and increases the risk of stroke. High blood pressure is more likely to develop in:  People who have blood pressure in the high end of the normal range (130-139/85-89 mm Hg).  People who are overweight or obese.  People who are African American.  If you are 79-20 years of age, have your blood pressure checked every 3-5 years. If you  are 21 years of age or older, have your blood pressure checked every year. You should have your blood pressure measured twice-once when you are at a hospital or clinic, and once when you are not at a hospital or clinic. Record the average of the two measurements. To check your blood pressure when you are not at a hospital or clinic, you can use:  An automated blood pressure machine at a pharmacy.  A home blood pressure monitor.  If you are between 56 years and 60 years old, ask your health care provider if you should take aspirin to prevent strokes.  Have regular diabetes screenings. This involves taking a blood sample to check your fasting blood sugar level.  If you are at a normal weight and have a low risk for diabetes, have this test once every three years after 66 years of age.  If you are overweight and have a high risk for diabetes, consider being tested at a younger age or more often. Preventing infection Hepatitis B  If you have a higher risk for hepatitis B, you should be screened for this virus. You are considered at high risk for hepatitis B if:  You were born in a country where hepatitis B is common. Ask your health care provider which countries are considered high risk.  Your parents were born in a high-risk country, and you have not been immunized against hepatitis B (hepatitis B vaccine).  You have HIV or AIDS.  You use needles to inject street drugs.  You live with someone who has hepatitis B.  You have had sex with someone who has hepatitis B.  You get hemodialysis treatment.  You take certain medicines for conditions, including cancer, organ transplantation, and autoimmune conditions. Hepatitis C  Blood testing is recommended for:  Everyone born from [birth year] through 1966.  Anyone with known risk factors for hepatitis C. Sexually transmitted infections (STIs)  You should be screened for sexually transmitted infections (STIs) including gonorrhea and chlamydia  if:  You are sexually active and are younger than 66 years of age.  You are older than 66 years of age and your health care provider tells you that you are at risk for this type of infection.  Your sexual activity has changed since you were last screened and you are at an increased risk for chlamydia or gonorrhea. Ask your health care provider if you are at risk.  If you do not have HIV, but are at risk, it may be recommended that you take a prescription medicine daily to prevent HIV infection. This is called pre-exposure prophylaxis (PrEP).  You are considered at risk if:  You are sexually active and do not regularly use condoms or know the HIV status of your partner(s).  You take drugs by injection.  You are sexually active with a partner who has HIV. Talk with your health care provider about whether you are at high risk of being infected with HIV. If you choose to begin PrEP, you should first be tested for HIV. You should then be tested every 3 months for as long as you are taking PrEP. Pregnancy  If you are premenopausal and you may become pregnant, ask your health care provider about preconception counseling.  If you may become pregnant, take 400 to 800 micrograms (mcg) of folic acid every day.  If you want to prevent pregnancy, talk to your health care provider about birth control (contraception). Osteoporosis and menopause  Osteoporosis is a disease in which the bones lose minerals and strength with aging. This can result in serious bone fractures. Your risk for osteoporosis can be identified using a bone density scan.  If you are 35 years of age or older, or if you are at risk for osteoporosis and fractures, ask your health care provider if you should be screened.  Ask your health care provider whether you should take a calcium or vitamin D supplement to lower your risk for osteoporosis.  Menopause may have certain physical symptoms and risks.  Hormone replacement therapy may  reduce some of these symptoms and risks. Talk to your health care provider about whether hormone replacement therapy is right for you. Follow these instructions at home:  Schedule regular health, dental, and eye exams.  Stay current with your immunizations.  Do not use any tobacco products including cigarettes, chewing tobacco, or electronic cigarettes.  If you are pregnant, do not drink alcohol.  If you are breastfeeding, limit how much and how often you drink alcohol.  Limit alcohol intake to no more than 1 drink per day for nonpregnant women. One drink equals 12 ounces of beer, 5 ounces of wine, or 1 ounces of hard liquor.  Do not use street drugs.  Do not share needles.  Ask your health care provider for help if you need support or information about quitting drugs.  Tell your health care provider if you often feel depressed.  Tell your health care provider if you have ever been abused or do not feel safe at home. This information is not intended to replace advice given to you by your health care provider. Make sure you discuss any questions you have with your health care provider. Document Released: 05/30/2011 Document Revised: 04/21/2016 Document Reviewed: 08/18/2015  2017 [employer]

## 2016-11-08 NOTE — Assessment & Plan Note (Signed)
Left-sided head near orbit Started at the time of-possible sinus infection, but she is not experiencing any sinus pain or pressure Will check sinus x-ray and treat if needed She will be seeing her neurologist in 2 days and can discuss with her as well May need to consider CT of brain

## 2016-11-08 NOTE — Assessment & Plan Note (Signed)
Following with neurology-getting Botox injections Taking Topamax daily Imitrex as needed Uses hydrocodone Imitrex does not work-taking about twice a week I will continue to prescribe hydrocodone Follow-up in 6 months

## 2016-11-08 NOTE — Progress Notes (Signed)
Subjective:    Patient ID: Michaela Rios, female    DOB: 21-Jun-1951, 65 y.o.   MRN: [id number]  HPI She is here for a physical exam.   She has gained weight.  She denies changes in her eating.  She is not exercising regularly, which is not new.    She had a recent cold and most of the symptoms have improved, but she still has a headache.  The headache is on the left side.  She only gets migraines on the right side, so it is not a migraine.  The pain is not constant and is relieved with pain medication. The pain is focused around her left side. She denies any sinus pain. The headaches started with cold so she was concerned about the possibility of a sinus infection.  Anxiety: She is taking her medication daily as prescribed. She denies any side effects from the medication. She feels her anxiety is well controlled and she is happy with her current dose of medication.   Depression: She is taking her medication daily as prescribed. She denies any side effects from the medication. She feels her depression is well controlled and she is happy with her current dose of medication.   Migraines:  She sees neurology.  She takes imitrex as needed and if that does not work she uses the norco.  She will be having botox injections this week, which typically helps.    Medications and allergies reviewed with patient and updated if appropriate.  Patient Active Problem List   Diagnosis Date Noted  . Vitamin D deficiency 06/08/2009  . Depression 01/07/2009  . PEPTIC ULCER DISEASE, CHRONIC 01/07/2009  . OTHER OBSTRUCTION OF DUODENUM 01/07/2009  . HIATAL HERNIA 01/07/2009  . IRRITABLE BOWEL SYNDROME 01/07/2009  . DEGENERATIVE JOINT DISEASE 01/07/2009  . ANEMIA, HX OF 01/07/2009  . PERIPHERAL VASCULAR DISEASE 06/09/2008  . PULMONARY NODULE 06/09/2008  . Anxiety 04/01/2008  . Essential hypertension 04/01/2008  . Allergic rhinitis 04/01/2008  . GERD 04/01/2008  . LOW BACK PAIN SYNDROME 04/01/2008    . Chronic migraine 04/01/2008    Current Outpatient Prescriptions on File Prior to Visit  Medication Sig Dispense Refill  . ALPRAZolam (XANAX) 0.5 MG tablet TAKE 1 TABLET BY MOUTH 3 TIMES A DAY AS NEEDED FOR ANXIETY 90 tablet 0  . calcium-vitamin D (OSCAL 500/200 D-3) 500-200 MG-UNIT per tablet Take 1 tablet by mouth twice daily     . clidinium-chlordiazePOXIDE (LIBRAX) 5-2.5 MG per capsule Take 1 capsule by mouth 2 (two) times daily as needed. 180 capsule 0  . EPINEPHrine (EPIPEN 2-PAK) 0.3 mg/0.3 mL IJ SOAJ injection Inject 0.3 mLs (0.3 mg total) into the muscle once. 2 Device 1  . estradiol (VIVELLE-DOT) 0.025 MG/24HR Place 1 patch onto the skin 2 (two) times a week. 8 patch 12  . felodipine (PLENDIL) 5 MG 24 hr tablet Take 1 tablet (5 mg total) by mouth daily. 90 tablet 3  . furosemide (LASIX) 20 MG tablet TAKE 1 TABLET BY MOUTH  DAILY 90 tablet 1  . HYDROcodone-acetaminophen (NORCO) 7.5-325 MG tablet Take 1 tablet by mouth 3 (three) times daily as needed for moderate pain. 90 tablet 0  . meclizine (ANTIVERT) 25 MG tablet Take 1 tablet by mouth every 6 hours as needed for dizziness 90 tablet 3  . metoprolol succinate (TOPROL XL) 100 MG 24 hr tablet Take 1 tablet (100 mg total) by mouth daily. Take with or immediately following a meal. 90 tablet 3  .  omeprazole (PRILOSEC) 40 MG capsule TAKE 1 CAPSULE BY MOUTH  DAILY 90 capsule 1  . topiramate (TOPAMAX) 50 MG tablet Take 1 tablet (50 mg total) by mouth 2 (two) times daily. 180 tablet 3  . zolpidem (AMBIEN) 10 MG tablet Take 1 tablet (10 mg total) by mouth at bedtime as needed for sleep. 15 tablet 1  . SUMAtriptan (IMITREX) 100 MG tablet Take 1 tablet by mouth  every 2 hours as needed for migraine. Max 2 tablets in  24 hours.    . SUMAtriptan (IMITREX) 20 MG/ACT nasal spray Place 1 spray into the nose.    . venlafaxine XR (EFFEXOR-XR) 75 MG 24 hr capsule Take 225 mg by mouth.     No current facility-administered medications on file prior to  visit.     Past Medical History:  Diagnosis Date  . Anemia   . Anxiety   . Arthritis   . Cataract   . Chronic migraine    follows with neuro for same  . Clotting disorder (HCC)    clot in ovarian vein- hx  . Esophagitis   . GERD (gastroesophageal reflux disease)   . Hypertension   . IBS (irritable bowel syndrome)   . Low back pain syndrome   . MRSA (methicillin resistant Staphylococcus aureus) infection 04/17/12   left torso  . Peptic ulcer disease 2003, 12/2012  . Peripheral vascular disease ([place])   . Vitamin D deficiency     Past Surgical History:  Procedure Laterality Date  . ABDOMINAL HYSTERECTOMY    . LAPAROSCOPIC BILATERAL SALPINGO OOPHERECTOMY    . pud surgery w/duod sticture-plasty and vagotomy  2003   Dr. Marlou Starks  . TONSILLECTOMY AND ADENOIDECTOMY     as a child  . TUBAL LIGATION  1984    Social History   Social History  . Marital status: Married    Spouse name: Levada Dy  . Number of children: 1  . Years of education: N/A   Occupational History  . [employer]   Social History Main Topics  . Smoking status: Never Smoker  . Smokeless tobacco: Never Used  . Alcohol use Yes     Comment: rare  . Drug use: No  . Sexual activity: Not on file   Other Topics Concern  . Not on file   Social History Narrative   Lives with spouse    Grown son in [place], 2 g kids    Family History  Problem Relation Age of Onset  . Aneurysm Father   . Prostate cancer Brother   . Colon cancer Neg Hx   . Esophageal cancer Neg Hx   . Rectal cancer Neg Hx   . Stomach cancer Neg Hx   . Lung cancer Brother     Review of Systems  Constitutional: Negative for chills and fever.  HENT: Positive for congestion (in morning only) and postnasal drip. Negative for ear pain, sinus pain, sinus pressure and sore throat.   Eyes: Negative for visual disturbance.  Respiratory: Negative for cough, shortness of breath and wheezing.   Cardiovascular: Negative for chest pain  and palpitations.  Gastrointestinal: Negative for abdominal pain, blood in stool, constipation, diarrhea and nausea.       Gerd controlled  Genitourinary: Negative for dysuria and hematuria.  Musculoskeletal: Positive for arthralgias (left hip and leg pain). Negative for back pain.  Skin: Negative for color change and rash.  Neurological: Positive for dizziness (intermittent) and headaches (headaches on left side since  cold, migraines on right side). Negative for light-headedness.  Psychiatric/Behavioral: The patient is nervous/anxious (controlled).        Objective:   Vitals:   11/08/16 0841  BP: 126/86  Pulse: 80  Resp: 16  Temp: 98.1 F (36.7 C)   Filed Weights   11/08/16 0841  Weight: 148 lb (67.1 kg)   Body mass index is 25.4 kg/m.   Physical Exam Constitutional: She appears well-developed and well-nourished. No distress.  HENT:  Head: Normocephalic and atraumatic. No sinus tenderness. Right Ear: External ear normal. Normal ear canal and TM Left Ear: External ear normal.  Normal ear canal and TM Mouth/Throat: Oropharynx is clear and moist.  Eyes: Conjunctivae and EOM are normal.  Neck: Neck supple. No tracheal deviation present. No thyromegaly present.  No carotid bruit  Cardiovascular: Normal rate, regular rhythm and normal heart sounds.   No murmur heard.  No edema. Pulmonary/Chest: Effort normal and breath sounds normal. No respiratory distress. She has no wheezes. She has no rales.  Breast: deferred  Abdominal: Soft. She exhibits no distension. There is no tenderness.  Lymphadenopathy: She has no cervical adenopathy.  Skin: Skin is warm and dry. She is not diaphoretic.  Psychiatric: She has a normal mood and affect. Her behavior is normal.       Assessment & Plan:   Physical exam: Screening blood work ordered Immunizations   Discussed prevnar and shingles vaccines Colonoscopy  Up to date  Mammogram - up to date done a [facility] - no following with gyn    Dexa - normal in 2015 Eye exams  Up to date  Exercise - none, stressed regular exercise Weight - advised weight loss Skin - no concerns. Substance abuse   none  See Problem List for Assessment and Plan of chronic medical problems.  F/u in 6 months for migraines, htn

## 2016-11-08 NOTE — Assessment & Plan Note (Addendum)
GERD controlled H/o PUD - ? nsaid related Continue daily medication but try decreasing dose to 20 mg daily She does avoid nsaids

## 2016-11-08 NOTE — Assessment & Plan Note (Addendum)
BP well controlled today Current regimen effective and well tolerated Continue current medications at current doses Advised regular exercise

## 2016-11-15 ENCOUNTER — Telehealth: Payer: Self-pay | Admitting: *Deleted

## 2016-11-15 MED ORDER — ZOLPIDEM TARTRATE 10 MG PO TABS: 10.0000 mg | ORAL_TABLET | Freq: Every evening | ORAL | 0 refills | Status: DC | PRN

## 2016-11-15 NOTE — Telephone Encounter (Signed)
Rec'd fax pt requesting refill on Zolpidem Tartrate 10 mg. Last filled 10/10/16.Marland KitchenJohny Chess

## 2016-11-15 NOTE — Telephone Encounter (Signed)
[place] controlled substance database checked.  Ok to fill medication.  

## 2016-11-15 NOTE — Telephone Encounter (Signed)
[facility]...Michaela Rios

## 2016-11-18 ENCOUNTER — Encounter: Payer: Self-pay | Admitting: Internal Medicine

## 2016-12-01 ENCOUNTER — Other Ambulatory Visit: Payer: Self-pay | Admitting: Internal Medicine

## 2016-12-02 NOTE — Telephone Encounter (Signed)
RX faxed to [facility] 

## 2016-12-06 ENCOUNTER — Telehealth: Payer: Self-pay | Admitting: Internal Medicine

## 2016-12-06 DIAGNOSIS — R109 Unspecified abdominal pain: Secondary | ICD-10-CM

## 2016-12-06 MED ORDER — HYDROCODONE-ACETAMINOPHEN 7.5-325 MG PO TABS: 1.0000 | ORAL_TABLET | Freq: Three times a day (TID) | ORAL | 0 refills | Status: DC | PRN

## 2016-12-06 NOTE — Telephone Encounter (Signed)
Patient also requesting refill on hydrocodone.

## 2016-12-06 NOTE — Telephone Encounter (Signed)
Please advise. Last fill 08/25/16

## 2016-12-06 NOTE — Telephone Encounter (Signed)
[place] controlled substance database checked.  Ok to fill medication.  rx printed.   What GI symptoms is she having now so I can put it in the referral?

## 2016-12-06 NOTE — Telephone Encounter (Signed)
Patient states she has a history of stomach ulcers.  States she use to see Dr. Olevia Perches.  She is requesting Dr. [name] to refer her back to GI.

## 2016-12-07 NOTE — Telephone Encounter (Signed)
Referral ordered

## 2016-12-07 NOTE — Telephone Encounter (Signed)
Informed pt rx was ready for pick-up. Pt states When pt eats she feels like she has a stomach ache, happens just about every day. Pt is asking that we do not send her to Dr Bari Edward. Please make note in referral.

## 2016-12-13 ENCOUNTER — Telehealth: Payer: Self-pay | Admitting: Internal Medicine

## 2016-12-13 NOTE — Telephone Encounter (Signed)
RX faxed to [facility] 

## 2016-12-20 NOTE — Telephone Encounter (Signed)
Patient states that [facility] doesn't have any refill for the Ambien. Please advise

## 2016-12-20 NOTE — Telephone Encounter (Signed)
L[facility] informing [facility]

## 2016-12-27 ENCOUNTER — Ambulatory Visit (INDEPENDENT_AMBULATORY_CARE_PROVIDER_SITE_OTHER): Payer: 59 | Admitting: Gastroenterology

## 2016-12-27 ENCOUNTER — Encounter: Payer: Self-pay | Admitting: Gastroenterology

## 2016-12-27 VITALS — BP 112/82 | HR 96 | Ht 63.5 in | Wt 143.0 lb

## 2016-12-27 DIAGNOSIS — R1012 Left upper quadrant pain: Secondary | ICD-10-CM

## 2016-12-27 DIAGNOSIS — K581 Irritable bowel syndrome with constipation: Secondary | ICD-10-CM

## 2016-12-27 DIAGNOSIS — K257 Chronic gastric ulcer without hemorrhage or perforation: Secondary | ICD-10-CM | POA: Diagnosis not present

## 2016-12-27 MED ORDER — HYOSCYAMINE SULFATE 0.125 MG SL SUBL: 0.1250 mg | SUBLINGUAL_TABLET | Freq: Four times a day (QID) | SUBLINGUAL | 3 refills | Status: DC | PRN

## 2016-12-27 NOTE — Progress Notes (Signed)
[facility] Consult Note:  History: Michaela Rios 12/27/2016  Referring physician: Binnie Rail, MD  Reason for consult/chief complaint: Abdominal Pain (LUQ since Christmas at times with nausea and vomiting)   Subjective  HPI:  In 12/2012, Michaela Rios wrote: "This is a 66 year old African American female with chronic abdominal pain due to peptic ulcer disease. Her last appointment was in July 2013. She underwent a vagotomy and stricturoplasty in 2002 for duodenal outlet obstruction. She had a recurrent duodenal ulcer in 2004 and was hospitalized for pancreatitis. Her last upper endoscopy in March 2012 showed a pyloric channel ulcer. She has been on omeprazole 40 mg daily with reasonable control of the pain. She is due for a colonoscopy. Her last exam in 1999 was normal. She is complaining of lower abdominal discomfort, cramps. Her bowel movements are every 3-4 days. She denies rectal bleeding, but she has lost about 8 pounds since her last visit in July mostly because of decreased appetite. She is on multiple medications for headaches and anxiety. She needs a refill on her Librax and omeprazole."  Colon 3/14 - excellent prep, no polyps, recall 10 yrs EGD 3/14 - subcm pyloric shallow ulcer  Michaela Rios is here for abdominal pain that has been going on several months. Curiously, Dr. Olevia Rios makes mention of chronic crampy lower abdominal pain at her last evaluation and had prescribed Librax. The patient cannot recall how long she took it but believes it did help. Michaela Rios has had several months of intermittent sharp left-sided upper abdominal pain that might radiate across the abdomen. Much of the time she also has some dull nonradiating abdominal pain "I know it's there". With a recent episode of this sharp pain she took a Norco which she has on hand for severe migraines. It sounds like she has long time difficulty with migraines and recently had Botox therapy for this. She tends to have a  bowel movement 2 or 3 times a week, and this is been her pattern for many years. She has also gained about 20 pounds most last year, which she attributes to decreased physical activity. Michaela Rios denies rectal bleeding. ROS:  Review of Systems  Constitutional: Negative for appetite change and unexpected weight change.  HENT: Negative for mouth sores and voice change.   Eyes: Negative for pain and redness.  Respiratory: Negative for cough and shortness of breath.   Cardiovascular: Negative for chest pain and palpitations.  Genitourinary: Negative for dysuria and hematuria.  Musculoskeletal: Negative for arthralgias and myalgias.  Skin: Negative for pallor and rash.  Neurological: Positive for headaches. Negative for weakness.  Hematological: Negative for adenopathy.  Psychiatric/Behavioral: The patient is nervous/anxious.      Past Medical History: Past Medical History:  Diagnosis Date  . Anemia   . Anxiety   . Arthritis   . Cataract   . Chronic migraine    follows with neuro for same  . Clotting disorder (HCC)    clot in ovarian vein- hx  . Esophagitis   . GERD (gastroesophageal reflux disease)   . Hypertension   . IBS (irritable bowel syndrome)   . Low back pain syndrome   . MRSA (methicillin resistant Staphylococcus aureus) infection 04/17/12   left torso  . Peptic ulcer disease 2003, 12/2012  . Peripheral vascular disease ([place])   . Vitamin D deficiency      Past Surgical History: Past Surgical History:  Procedure Laterality Date  . ABDOMINAL HYSTERECTOMY    . LAPAROSCOPIC BILATERAL SALPINGO OOPHERECTOMY    .  pud surgery w/duod sticture-plasty and vagotomy  2003   Michaela Rios  . TONSILLECTOMY AND ADENOIDECTOMY     as a child  . TUBAL LIGATION  1984     Family History: Family History  Problem Relation Age of Onset  . Aneurysm Father   . Prostate cancer Brother   . Lung cancer Brother   . Colon cancer Neg Hx   . Esophageal cancer Neg Hx   . Rectal cancer Neg Hx    . Stomach cancer Neg Hx     Social History: Social History   Social History  . Marital status: Married    Spouse name: Michaela Rios  . Number of children: 1  . Years of education: N/A   Occupational History  . [employer]   Social History Main Topics  . Smoking status: Never Smoker  . Smokeless tobacco: Never Used  . Alcohol use Yes     Comment: rare  . Drug use: No  . Sexual activity: Not Asked   Other Topics Concern  . None   Social History Narrative   Lives with spouse    Grown son in [place], 2 g kids    Allergies: Allergies  Allergen Reactions  . Amoxicillin     REACTION: hives  . Cephalexin     REACTION: hives  . Lisinopril     REACTION: ? hx of ACE cough...    Outpatient Meds: Current Outpatient Prescriptions  Medication Sig Dispense Refill  . ALPRAZolam (XANAX) 0.5 MG tablet TAKE 1 TABLET BY MOUTH 3 TIMES A DAY AS NEEDED FOR ANXIETY 90 tablet 0  . calcium-vitamin D (OSCAL 500/200 D-3) 500-200 MG-UNIT per tablet Take 1 tablet by mouth twice daily     . clidinium-chlordiazePOXIDE (LIBRAX) 5-2.5 MG per capsule Take 1 capsule by mouth 2 (two) times daily as needed. 180 capsule 0  . EPINEPHrine (EPIPEN 2-PAK) 0.3 mg/0.3 mL IJ SOAJ injection Inject 0.3 mLs (0.3 mg total) into the muscle once. 2 Device 1  . estradiol (VIVELLE-DOT) 0.025 MG/24HR Place 1 patch onto the skin 2 (two) times a week. 8 patch 12  . felodipine (PLENDIL) 5 MG 24 hr tablet Take 1 tablet (5 mg total) by mouth daily. 90 tablet 3  . furosemide (LASIX) 20 MG tablet TAKE 1 TABLET BY MOUTH  DAILY 90 tablet 1  . HYDROcodone-acetaminophen (NORCO) 7.5-325 MG tablet Take 1 tablet by mouth 3 (three) times daily as needed for moderate pain. 90 tablet 0  . meclizine (ANTIVERT) 25 MG tablet Take 1 tablet by mouth every 6 hours as needed for dizziness 90 tablet 3  . metoprolol succinate (TOPROL XL) 100 MG 24 hr tablet Take 1 tablet (100 mg total) by mouth daily. Take with or immediately  following a meal. 90 tablet 3  . omeprazole (PRILOSEC) 20 MG capsule Take 1 capsule (20 mg total) by mouth daily. 90 capsule 3  . topiramate (TOPAMAX) 50 MG tablet Take 1 tablet (50 mg total) by mouth 2 (two) times daily. 180 tablet 3  . zolpidem (AMBIEN) 10 MG tablet TAKE 1 TABLET BY MOUTH AT BEDTIME FOR SLEEP 30 tablet 0  . hyoscyamine (LEVSIN SL) 0.125 MG SL tablet Place 1 tablet (0.125 mg total) under the tongue every 6 (six) hours as needed. 30 tablet 3  . SUMAtriptan (IMITREX) 100 MG tablet Take 1 tablet by mouth  every 2 hours as needed for migraine. Max 2 tablets in  24 hours.    . SUMAtriptan (  IMITREX) 20 MG/ACT nasal spray Place 1 spray into the nose.    . venlafaxine XR (EFFEXOR-XR) 75 MG 24 hr capsule Take 225 mg by mouth.     No current facility-administered medications for this visit.       ___________________________________________________________________ Objective   Exam:  BP 112/82   Pulse 96   Ht 5' 3.5" (1.613 m)   Wt 143 lb (64.9 kg)   BMI 24.93 kg/m    General: this is a(n) Well-appearing woman with good muscle mass   Eyes: sclera anicteric, no redness  ENT: oral mucosa moist without lesions, no cervical or supraclavicular lymphadenopathy, good dentition  CV: RRR without murmur, S1/S2, no JVD, no peripheral edema  Resp: clear to auscultation bilaterally, normal RR and effort noted  GI: soft, no tenderness, with active bowel sounds. No guarding or palpable organomegaly noted.  Skin; warm and dry, no rash or jaundice noted  Neuro: awake, alert and oriented x 3. Normal gross motor function and fluent speech  Previous endoscopy/colonoscopy reports and pathology reviewed  Assessment: Encounter Diagnoses  Name Primary?  . LUQ pain Yes  . Chronic gastric ulcer without hemorrhage and without perforation   . Irritable bowel syndrome with constipation     Sounds like she has had exacerbation of an underlying functional bowel disorder. I am doubtful  this is due to an ulcer, and an not currently planning to perform an upper endoscopy.  Plan:  I prescribed hyoscyamine rather than Librax because she is already on Xanax, and I do not want her to have dual benzodiazepine medicines. She will see me as needed.  Thank you for the courtesy of this consult.  Please call me with any questions or concerns.  Michaela Rios Rios  CC: Binnie Rail, MD

## 2016-12-27 NOTE — Patient Instructions (Signed)
If you are age 67 or older, your body mass index should be between 23-30. Your Body mass index is 24.93 kg/m. If this is out of the aforementioned range listed, please consider follow up with your Primary Care Provider.  If you are age 66 or younger, your body mass index should be between 19-25. Your Body mass index is 24.93 kg/m. If this is out of the aformentioned range listed, please consider follow up with your Primary Care Provider.   We have sent the following medications to your pharmacy for you to pick up at your convenience: Levsin  Thank you for choosing [facility]  Dr Wilfrid Lund III

## 2017-01-05 ENCOUNTER — Telehealth: Payer: Self-pay | Admitting: Gastroenterology

## 2017-01-05 NOTE — Telephone Encounter (Signed)
Spoke to patient, she relates that she had some GI upset, question of flu, that started on 12/29/16. She had N/V, was not able to take any of her medications until 01/03/17. She states that she has only got in one full day of the Levsin that Dr. Loletha Carrow prescribed, still having pain after eating. Told her that since she is still trying to recover from being ill and only one day of trying the Levsin, to try it a few more days. Then if it is not working to call back. Patient was in agreement with this plan, admits that she really has not given the medication a chance.

## 2017-01-08 ENCOUNTER — Other Ambulatory Visit: Payer: Self-pay | Admitting: Internal Medicine

## 2017-01-10 ENCOUNTER — Other Ambulatory Visit: Payer: Self-pay | Admitting: Internal Medicine

## 2017-01-10 MED ORDER — ALPRAZOLAM 0.5 MG PO TABS: ORAL_TABLET | ORAL | 0 refills | Status: DC

## 2017-01-10 NOTE — Telephone Encounter (Signed)
RX faxed to [facility] 

## 2017-01-10 NOTE — Telephone Encounter (Signed)
[place] controlled substance database checked.  Ok to fill medication.  

## 2017-01-18 ENCOUNTER — Other Ambulatory Visit: Payer: Self-pay | Admitting: Internal Medicine

## 2017-02-02 ENCOUNTER — Other Ambulatory Visit: Payer: Self-pay | Admitting: *Deleted

## 2017-02-02 MED ORDER — EPINEPHRINE 0.3 MG/0.3ML IJ SOAJ: 0.3000 mg | Freq: Once | INTRAMUSCULAR | 0 refills | Status: AC

## 2017-02-02 NOTE — Telephone Encounter (Signed)
Rec'd call pt states her epi pen has expired and needing [facility]. Sent rx electronically...Michaela Rios

## 2017-02-09 ENCOUNTER — Other Ambulatory Visit: Payer: Self-pay | Admitting: Internal Medicine

## 2017-02-20 ENCOUNTER — Other Ambulatory Visit: Payer: Self-pay | Admitting: Internal Medicine

## 2017-03-15 ENCOUNTER — Other Ambulatory Visit: Payer: Self-pay | Admitting: Internal Medicine

## 2017-03-15 NOTE — Telephone Encounter (Signed)
RX will be faxed to [facility] once fax machine comes back up.

## 2017-03-22 ENCOUNTER — Other Ambulatory Visit: Payer: Self-pay | Admitting: Internal Medicine

## 2017-03-28 ENCOUNTER — Other Ambulatory Visit: Payer: Self-pay | Admitting: Internal Medicine

## 2017-03-28 NOTE — Telephone Encounter (Signed)
[place] controlled substance database checked.  Ok to fill medication.  

## 2017-03-29 NOTE — Telephone Encounter (Signed)
[facility] had [facility] on pharmacy vm../lmb 

## 2017-04-21 ENCOUNTER — Other Ambulatory Visit: Payer: Self-pay | Admitting: Internal Medicine

## 2017-04-21 ENCOUNTER — Other Ambulatory Visit: Payer: Self-pay | Admitting: Gastroenterology

## 2017-04-21 NOTE — Telephone Encounter (Signed)
[facility] had [facility] on pharmacy vm../lmb 

## 2017-04-21 NOTE — Telephone Encounter (Signed)
[place] controlled substance database checked.  Ok to fill medication.  

## 2017-04-27 ENCOUNTER — Other Ambulatory Visit: Payer: Self-pay | Admitting: Internal Medicine

## 2017-04-27 MED ORDER — ZOLPIDEM TARTRATE 10 MG PO TABS: 10.0000 mg | ORAL_TABLET | Freq: Every evening | ORAL | 0 refills | Status: DC | PRN

## 2017-04-27 NOTE — Telephone Encounter (Signed)
RX faxed to [facility] 

## 2017-04-27 NOTE — Telephone Encounter (Signed)
[place] controlled substance database checked.  Ok to fill medication. printed 

## 2017-05-09 ENCOUNTER — Ambulatory Visit: Payer: 59 | Admitting: Internal Medicine

## 2017-05-09 NOTE — Progress Notes (Deleted)
Subjective:    Patient ID: Michaela Rios, female    DOB: 22-Apr-1951, 66 y.o.   MRN: [id number]  HPI The patient is here for follow up.  Chronic migraines:  She follows with neurology. She takes topamax daily, imitrex as needed.  She takes hydrocodone only as needed.   Hypertension: She is taking her medication daily. She is compliant with a low sodium diet.  She denies chest pain, palpitations, edema, shortness of breath and regular headaches. She is exercising regularly.  She does not monitor her blood pressure at home.    Prediabetes:  She is compliant with a low sugar/carbohydrate diet.  She is exercising regularly.  GERD:  She is taking her medication daily as prescribed.  She denies any GERD symptoms and feels her GERD is well controlled.   Depression: She is taking her medication daily as prescribed. She denies any side effects from the medication. She feels her depression is well controlled and she is happy with her current dose of medication.   Anxiety: She is taking her medication daily as prescribed. She denies any side effects from the medication. She feels her anxiety is well controlled and she is happy with her current dose of medication.    Medications and allergies reviewed with patient and updated if appropriate.  Patient Active Problem List   Diagnosis Date Noted  . Prediabetes 11/08/2016  . Nonintractable headache 11/08/2016  . Vitamin D deficiency 06/08/2009  . Depression 01/07/2009  . PEPTIC ULCER DISEASE, CHRONIC 01/07/2009  . OTHER OBSTRUCTION OF DUODENUM 01/07/2009  . HIATAL HERNIA 01/07/2009  . IRRITABLE BOWEL SYNDROME 01/07/2009  . DEGENERATIVE JOINT DISEASE 01/07/2009  . ANEMIA, HX OF 01/07/2009  . PULMONARY NODULE 06/09/2008  . Anxiety 04/01/2008  . Essential hypertension 04/01/2008  . Allergic rhinitis 04/01/2008  . GERD 04/01/2008  . LOW BACK PAIN SYNDROME 04/01/2008  . Chronic migraine 04/01/2008    Current Outpatient Prescriptions on  File Prior to Visit  Medication Sig Dispense Refill  . ALPRAZolam (XANAX) 0.5 MG tablet TAKE 1 TABLET BY MOUTH 3 TIMES A DAY AS NEEDED FOR ANXIETY 90 tablet 0  . ALPRAZolam (XANAX) 0.5 MG tablet TAKE 1 TABLET BY MOUTH 3 TIMES A DAY AS NEEDED FOR ANXIETY 90 tablet 0  . ALPRAZolam (XANAX) 0.5 MG tablet TAKE 1 TABLET BY MOUTH 3 TIMES A DAY AS NEEDED FOR ANXIETY 90 tablet 0  . calcium-vitamin D (OSCAL 500/200 D-3) 500-200 MG-UNIT per tablet Take 1 tablet by mouth twice daily     . clidinium-chlordiazePOXIDE (LIBRAX) 5-2.5 MG per capsule Take 1 capsule by mouth 2 (two) times daily as needed. 180 capsule 0  . estradiol (VIVELLE-DOT) 0.025 MG/24HR Place 1 patch onto the skin 2 (two) times a week. 8 patch 12  . felodipine (PLENDIL) 5 MG 24 hr tablet Take 1 tablet (5 mg total) by mouth daily. 90 tablet 3  . furosemide (LASIX) 20 MG tablet TAKE 1 TABLET BY MOUTH  DAILY 90 tablet 1  . HYDROcodone-acetaminophen (NORCO) 7.5-325 MG tablet Take 1 tablet by mouth 3 (three) times daily as needed for moderate pain. 90 tablet 0  . hyoscyamine (LEVSIN SL) 0.125 MG SL tablet DISSOLVE 1 TABLET UNDER TONGUE EVERY 6 HOURS AS NEEDED 30 tablet 3  . meclizine (ANTIVERT) 25 MG tablet Take 1 tablet by mouth every 6 hours as needed for dizziness 90 tablet 3  . metoprolol succinate (TOPROL XL) 100 MG 24 hr tablet Take 1 tablet (100 mg total) by mouth  daily. Take with or immediately following a meal. 90 tablet 3  . omeprazole (PRILOSEC) 20 MG capsule Take 1 capsule (20 mg total) by mouth daily. 90 capsule 3  . SUMAtriptan (IMITREX) 100 MG tablet Take 1 tablet by mouth  every 2 hours as needed for migraine. Max 2 tablets in  24 hours.    . SUMAtriptan (IMITREX) 20 MG/ACT nasal spray Place 1 spray into the nose.    . topiramate (TOPAMAX) 50 MG tablet Take 1 tablet (50 mg total) by mouth 2 (two) times daily. 180 tablet 3  . venlafaxine XR (EFFEXOR-XR) 75 MG 24 hr capsule Take 225 mg by mouth.    . zolpidem (AMBIEN) 10 MG tablet  Take 1 tablet (10 mg total) by mouth at bedtime as needed. for sleep 30 tablet 0   No current facility-administered medications on file prior to visit.     Past Medical History:  Diagnosis Date  . Anemia   . Anxiety   . Arthritis   . Cataract   . Chronic migraine    follows with neuro for same  . Clotting disorder (HCC)    clot in ovarian vein- hx  . Esophagitis   . GERD (gastroesophageal reflux disease)   . Hypertension   . IBS (irritable bowel syndrome)   . Low back pain syndrome   . MRSA (methicillin resistant Staphylococcus aureus) infection 04/17/12   left torso  . Peptic ulcer disease 2003, 12/2012  . Peripheral vascular disease ([place])   . Vitamin D deficiency     Past Surgical History:  Procedure Laterality Date  . ABDOMINAL HYSTERECTOMY    . LAPAROSCOPIC BILATERAL SALPINGO OOPHERECTOMY    . pud surgery w/duod sticture-plasty and vagotomy  2003   Dr. Marlou Starks  . TONSILLECTOMY AND ADENOIDECTOMY     as a child  . TUBAL LIGATION  1984    Social History   Social History  . Marital status: Married    Spouse name: Levada Dy  . Number of children: 1  . Years of education: N/A   Occupational History  . [employer]   Social History Main Topics  . Smoking status: Never Smoker  . Smokeless tobacco: Never Used  . Alcohol use Yes     Comment: rare  . Drug use: No  . Sexual activity: Not on file   Other Topics Concern  . Not on file   Social History Narrative   Lives with spouse    Grown son in [place], 2 g kids    Family History  Problem Relation Age of Onset  . Aneurysm Father   . Prostate cancer Brother   . Lung cancer Brother   . Colon cancer Neg Hx   . Esophageal cancer Neg Hx   . Rectal cancer Neg Hx   . Stomach cancer Neg Hx     Review of Systems     Objective:  There were no vitals filed for this visit. Wt Readings from Last 3 Encounters:  12/27/16 143 lb (64.9 kg)  11/08/16 148 lb (67.1 kg)  05/09/16 143 lb (64.9 kg)   There  is no height or weight on file to calculate BMI.   Physical Exam    Constitutional: Appears well-developed and well-nourished. No distress.  HENT:  Head: Normocephalic and atraumatic.  Neck: Neck supple. No tracheal deviation present. No thyromegaly present.  No cervical lymphadenopathy Cardiovascular: Normal rate, regular rhythm and normal heart sounds.   No murmur heard. No carotid  bruit .  No edema Pulmonary/Chest: Effort normal and breath sounds normal. No respiratory distress. No has no wheezes. No rales.  Skin: Skin is warm and dry. Not diaphoretic.  Psychiatric: Normal mood and affect. Behavior is normal.      Assessment & Plan:    See Problem List for Assessment and Plan of chronic medical problems.

## 2017-05-11 ENCOUNTER — Other Ambulatory Visit: Payer: Self-pay | Admitting: Internal Medicine

## 2017-05-12 ENCOUNTER — Ambulatory Visit (INDEPENDENT_AMBULATORY_CARE_PROVIDER_SITE_OTHER): Payer: 59 | Admitting: Internal Medicine

## 2017-05-12 ENCOUNTER — Encounter: Payer: Self-pay | Admitting: Internal Medicine

## 2017-05-12 ENCOUNTER — Other Ambulatory Visit (INDEPENDENT_AMBULATORY_CARE_PROVIDER_SITE_OTHER): Payer: 59

## 2017-05-12 VITALS — BP 156/108 | HR 81 | Temp 98.7°F | Resp 16 | Ht 63.5 in | Wt 140.0 lb

## 2017-05-12 DIAGNOSIS — R7303 Prediabetes: Secondary | ICD-10-CM

## 2017-05-12 DIAGNOSIS — F419 Anxiety disorder, unspecified: Secondary | ICD-10-CM

## 2017-05-12 DIAGNOSIS — I10 Essential (primary) hypertension: Secondary | ICD-10-CM

## 2017-05-12 DIAGNOSIS — G4700 Insomnia, unspecified: Secondary | ICD-10-CM | POA: Diagnosis not present

## 2017-05-12 DIAGNOSIS — K581 Irritable bowel syndrome with constipation: Secondary | ICD-10-CM | POA: Diagnosis not present

## 2017-05-12 DIAGNOSIS — K219 Gastro-esophageal reflux disease without esophagitis: Secondary | ICD-10-CM | POA: Diagnosis not present

## 2017-05-12 DIAGNOSIS — F3289 Other specified depressive episodes: Secondary | ICD-10-CM

## 2017-05-12 DIAGNOSIS — G43709 Chronic migraine without aura, not intractable, without status migrainosus: Secondary | ICD-10-CM | POA: Diagnosis not present

## 2017-05-12 DIAGNOSIS — IMO0002 Reserved for concepts with insufficient information to code with codable children: Secondary | ICD-10-CM

## 2017-05-12 LAB — HEMOGLOBIN A1C: Hgb A1c MFr Bld: 5.9 % (ref 4.6–6.5)

## 2017-05-12 LAB — COMPREHENSIVE METABOLIC PANEL
ALBUMIN: 4.4 g/dL (ref 3.5–5.2)
ALK PHOS: 77 U/L (ref 39–117)
ALT: 22 U/L (ref 0–35)
AST: 22 U/L (ref 0–37)
BUN: 26 mg/dL — AB (ref 6–23)
CO2: 26 mEq/L (ref 19–32)
CREATININE: 0.99 mg/dL (ref 0.40–1.20)
Calcium: 9.4 mg/dL (ref 8.4–10.5)
Chloride: 106 mEq/L (ref 96–112)
GFR: 59.54 mL/min — ABNORMAL LOW (ref >60.00)
GLUCOSE: 87 mg/dL (ref 70–99)
Potassium: 3.5 mEq/L (ref 3.5–5.1)
SODIUM: 140 meq/L (ref 135–145)
TOTAL PROTEIN: 7.9 g/dL (ref 6.0–8.3)
Total Bilirubin: 0.3 mg/dL (ref 0.2–1.2)

## 2017-05-12 MED ORDER — HYDROCODONE-ACETAMINOPHEN 7.5-325 MG PO TABS: 1.0000 | ORAL_TABLET | Freq: Three times a day (TID) | ORAL | 0 refills | Status: DC | PRN

## 2017-05-12 MED ORDER — METOPROLOL SUCCINATE ER 100 MG PO TB24: 100.0000 mg | ORAL_TABLET | Freq: Every day | ORAL | 3 refills | Status: DC

## 2017-05-12 MED ORDER — FUROSEMIDE 20 MG PO TABS: 20.0000 mg | ORAL_TABLET | Freq: Every day | ORAL | 1 refills | Status: DC

## 2017-05-12 NOTE — Patient Instructions (Addendum)

## 2017-05-12 NOTE — Assessment & Plan Note (Addendum)
Elevated here, but controlled at home Advised her to bring cuff in to make sure it is accurate Start regular exercise and work on weight loss Low sodium diet  cmp

## 2017-05-12 NOTE — Assessment & Plan Note (Signed)
GERD controlled Continue daily medication  

## 2017-05-12 NOTE — Assessment & Plan Note (Signed)
Taking hyoscyamine - not helping Trying taking two tabs at once.

## 2017-05-12 NOTE — Progress Notes (Signed)
Subjective:    Patient ID: Michaela Rios, female    DOB: [birth year], 66 y.o.   MRN: [id number]  HPI The patient is here for follow up.  Hypertension: She is taking her medication daily. She is compliant with a low sodium diet.  She denies chest pain, palpitations, edema, shortness of breath. She is not exercising regularly.    She does check her BP at home and it well controlled.    Prediabetes:  She is not always compliant with a low sugar/carbohydrate diet.  She eats wheat bread.  She drinks diet pepsi.  She eats sweets.  She drinks OJ.  She is not exercising regularly.  Depression: She is taking her medication daily as prescribed. She denies any side effects from the medication. She is not sure if her depression is ideally controlled.  She will discuss this with her neurologist who prescribes the medication.  Anxiety: She is taking her effexor daily as prescribed. She takes xanax as needed.  She denies any side effects from the medication. She feels her anxiety is well controlled.     GERD:  She is taking her medication daily as prescribed.  She denies any GERD symptoms and feels her GERD is well controlled.   Insomnia:  She takes ambien nightly.  She still does not always sleep well.  Once she gets asleep she usually sleeps well.    Migraine headaches:  She is following with neurology.  She tried botox - one session only and it did not help.  She takes topamax daily, imitrex as needed and hydrocodone when the imitrex does not work.    Medications and allergies reviewed with patient and updated if appropriate.  Patient Active Problem List   Diagnosis Date Noted  . Insomnia 05/12/2017  . Prediabetes 11/08/2016  . Nonintractable headache 11/08/2016  . Vitamin D deficiency 06/08/2009  . Depression 01/07/2009  . PEPTIC ULCER DISEASE, CHRONIC 01/07/2009  . OTHER OBSTRUCTION OF DUODENUM 01/07/2009  . HIATAL HERNIA 01/07/2009  . IRRITABLE BOWEL SYNDROME 01/07/2009  .  DEGENERATIVE JOINT DISEASE 01/07/2009  . ANEMIA, HX OF 01/07/2009  . PULMONARY NODULE 06/09/2008  . Anxiety 04/01/2008  . Essential hypertension 04/01/2008  . Allergic rhinitis 04/01/2008  . GERD 04/01/2008  . LOW BACK PAIN SYNDROME 04/01/2008  . Chronic migraine 04/01/2008    Current Outpatient Prescriptions on File Prior to Visit  Medication Sig Dispense Refill  . ALPRAZolam (XANAX) 0.5 MG tablet TAKE 1 TABLET BY MOUTH 3 TIMES A DAY AS NEEDED FOR ANXIETY 90 tablet 0  . calcium-vitamin D (OSCAL 500/200 D-3) 500-200 MG-UNIT per tablet Take 1 tablet by mouth twice daily     . clidinium-chlordiazePOXIDE (LIBRAX) 5-2.5 MG per capsule Take 1 capsule by mouth 2 (two) times daily as needed. 180 capsule 0  . estradiol (VIVELLE-DOT) 0.025 MG/24HR Place 1 patch onto the skin 2 (two) times a week. 8 patch 12  . felodipine (PLENDIL) 5 MG 24 hr tablet Take 1 tablet (5 mg total) by mouth daily. 90 tablet 3  . furosemide (LASIX) 20 MG tablet TAKE 1 TABLET BY MOUTH  DAILY 90 tablet 1  . HYDROcodone-acetaminophen (NORCO) 7.5-325 MG tablet Take 1 tablet by mouth 3 (three) times daily as needed for moderate pain. 90 tablet 0  . hyoscyamine (LEVSIN SL) 0.125 MG SL tablet DISSOLVE 1 TABLET UNDER TONGUE EVERY 6 HOURS AS NEEDED 30 tablet 3  . meclizine (ANTIVERT) 25 MG tablet Take 1 tablet by mouth every 6 hours as  needed for dizziness 90 tablet 3  . metoprolol succinate (TOPROL XL) 100 MG 24 hr tablet Take 1 tablet (100 mg total) by mouth daily. Take with or immediately following a meal. 90 tablet 3  . omeprazole (PRILOSEC) 20 MG capsule Take 1 capsule (20 mg total) by mouth daily. 90 capsule 3  . topiramate (TOPAMAX) 50 MG tablet Take 1 tablet (50 mg total) by mouth 2 (two) times daily. 180 tablet 3  . zolpidem (AMBIEN) 10 MG tablet Take 1 tablet (10 mg total) by mouth at bedtime as needed. for sleep 30 tablet 0  . SUMAtriptan (IMITREX) 100 MG tablet Take 1 tablet by mouth  every 2 hours as needed for  migraine. Max 2 tablets in  24 hours.    . SUMAtriptan (IMITREX) 20 MG/ACT nasal spray Place 1 spray into the nose.    . venlafaxine XR (EFFEXOR-XR) 75 MG 24 hr capsule Take 225 mg by mouth.     No current facility-administered medications on file prior to visit.     Past Medical History:  Diagnosis Date  . Anemia   . Anxiety   . Arthritis   . Cataract   . Chronic migraine    follows with neuro for same  . Clotting disorder (HCC)    clot in ovarian vein- hx  . Esophagitis   . GERD (gastroesophageal reflux disease)   . Hypertension   . IBS (irritable bowel syndrome)   . Low back pain syndrome   . MRSA (methicillin resistant Staphylococcus aureus) infection 04/17/12   left torso  . Peptic ulcer disease 2003, 12/2012  . Peripheral vascular disease ([place])   . Vitamin D deficiency     Past Surgical History:  Procedure Laterality Date  . ABDOMINAL HYSTERECTOMY    . LAPAROSCOPIC BILATERAL SALPINGO OOPHERECTOMY    . pud surgery w/duod sticture-plasty and vagotomy  2003   Dr. Marlou Starks  . TONSILLECTOMY AND ADENOIDECTOMY     as a child  . TUBAL LIGATION  1984    Social History   Social History  . Marital status: Married    Spouse name: Levada Dy  . Number of children: 1  . Years of education: N/A   Occupational History  . [employer]   Social History Main Topics  . Smoking status: Never Smoker  . Smokeless tobacco: Never Used  . Alcohol use Yes     Comment: rare  . Drug use: No  . Sexual activity: Not Asked   Other Topics Concern  . None   Social History Narrative   Lives with spouse    Grown son in [place], 2 g kids    Family History  Problem Relation Age of Onset  . Aneurysm Father   . Prostate cancer Brother   . Lung cancer Brother   . Colon cancer Neg Hx   . Esophageal cancer Neg Hx   . Rectal cancer Neg Hx   . Stomach cancer Neg Hx     Review of Systems  Constitutional: Negative for fever.  Respiratory: Positive for cough. Negative for  shortness of breath and wheezing.   Cardiovascular: Negative for chest pain, palpitations and leg swelling.  Gastrointestinal: Positive for abdominal pain (intermittent cramping).  Musculoskeletal: Positive for gait problem (feels off balanced at times).  Neurological: Positive for light-headedness and headaches.       Objective:   Vitals:   05/12/17 1603  BP: (!) 156/108  Pulse: 81  Resp: 16  Temp: 98.7  F (37.1 C)   Wt Readings from Last 3 Encounters:  05/12/17 140 lb (63.5 kg)  12/27/16 143 lb (64.9 kg)  11/08/16 148 lb (67.1 kg)   Body mass index is 24.41 kg/m.   Physical Exam    Constitutional: Appears well-developed and well-nourished. No distress.  HENT:  Head: Normocephalic and atraumatic.  Neck: Neck supple. No tracheal deviation present. No thyromegaly present.  No cervical lymphadenopathy Cardiovascular: Normal rate, regular rhythm and normal heart sounds.   No murmur heard. No carotid bruit .  No edema Pulmonary/Chest: Effort normal and breath sounds normal. No respiratory distress. No has no wheezes. No rales.  Skin: Skin is warm and dry. Not diaphoretic.  Psychiatric: depressed mood and affect. Behavior is normal.      Assessment & Plan:    See Problem List for Assessment and Plan of chronic medical problems.

## 2017-05-13 ENCOUNTER — Encounter: Payer: Self-pay | Admitting: Internal Medicine

## 2017-05-13 NOTE — Assessment & Plan Note (Signed)
Controlled, stable Continue current dose of effexor - will discuss with Dr Sima Matas  Continue xanax

## 2017-05-13 NOTE — Assessment & Plan Note (Signed)
Check a1c Low sugar / carb diet Stressed regular exercise, weight loss  

## 2017-05-13 NOTE — Assessment & Plan Note (Signed)
?   Controlled She will discuss with Dr Sima Matas who prescribes the medication

## 2017-05-13 NOTE — Assessment & Plan Note (Signed)
Follows w/ neuro - Dr Sima Matas topamax daily imitrex as needed, then vicodin only as needed, which I prescribe botox not effective Taking hydrocodone as prescribed - refilled today - [place] controlled substance database checked - no red flags

## 2017-05-13 NOTE — Assessment & Plan Note (Signed)
Taking ambien nightly The medication is not always effective Encouraged regular exercise Can try melatonin

## 2017-05-17 ENCOUNTER — Other Ambulatory Visit: Payer: Self-pay | Admitting: Internal Medicine

## 2017-05-23 ENCOUNTER — Other Ambulatory Visit: Payer: Self-pay | Admitting: Internal Medicine

## 2017-05-24 ENCOUNTER — Other Ambulatory Visit: Payer: Self-pay | Admitting: Internal Medicine

## 2017-05-24 MED ORDER — ALPRAZOLAM 0.5 MG PO TABS: ORAL_TABLET | ORAL | 0 refills | Status: DC

## 2017-05-24 NOTE — Telephone Encounter (Signed)
[place] Controlled Substance Database checked. Okay to fill RX. Last filled 04/21/17.

## 2017-05-24 NOTE — Telephone Encounter (Signed)
[facility]ed to [facility]

## 2017-05-29 ENCOUNTER — Other Ambulatory Visit: Payer: Self-pay | Admitting: Internal Medicine

## 2017-05-29 ENCOUNTER — Telehealth: Payer: Self-pay | Admitting: Internal Medicine

## 2017-05-29 NOTE — Telephone Encounter (Signed)
Pt needs a PA for HYDROcodone-acetaminophen (NORCO) 7.5-325 MG tablet

## 2017-05-29 NOTE — Telephone Encounter (Signed)
[place] controlled substance database checked.  Ok to fill medication.  

## 2017-05-30 ENCOUNTER — Other Ambulatory Visit: Payer: Self-pay | Admitting: Internal Medicine

## 2017-05-30 NOTE — Telephone Encounter (Signed)
Updated med list called [facility] had to leave on pharmacy vm...Michaela Rios

## 2017-05-30 NOTE — Telephone Encounter (Signed)
PA has been completed. Key [id number]

## 2017-06-01 NOTE — Telephone Encounter (Signed)
Patient called back wanting to know if it had been completed. She has been informed. She will follow up with pharmacy.

## 2017-06-01 NOTE — Telephone Encounter (Signed)
Rec'd call from [place] with [facility] stating pt is calling about the OA on the hydrocodone. We received PA but it was put in as a standard review, and pt stating need to be fill ASAP. Needing MD to call PA dept and change to Urgent review so it will take up to 48-72 hours...Johny Chess

## 2017-06-05 NOTE — Telephone Encounter (Signed)
PA for hydrocodone was denied. Spoke with pt to inform. Pt stated that she does not currently need the medication, that the pharmacy gave her a coupon to get the #90 tablets. She will contact us when she is in need of an RX and will try sending just 30 tablets in.

## 2017-06-19 ENCOUNTER — Telehealth: Payer: Self-pay | Admitting: Internal Medicine

## 2017-06-19 NOTE — Telephone Encounter (Signed)
[facility] [place] [facility]  Patient Name: Michaela Rios [facility]  DOB: 11/24/1951    Initial Comment Caller states they have infection in saliva gland, mouth is swollen,   Nurse Assessment  Nurse: Joline Salt, RN, Malachy Mood Date/Time (Eastern Time): 06/19/2017 4:22:35 PM  Confirm and document reason for call. If symptomatic, describe symptoms. ---Caller states they have infection in saliva gland and her mouth is swollen. It seems to be on the right side. Her temp is 100 orally. Swelling extends to outside of neck and side of face and is putting warm compresses on it. First noticed symptoms on Saturday.  Does the patient have any new or worsening symptoms? ---Yes  Will a triage be completed? ---Yes  Related visit to physician within the last 2 weeks? ---No  Does the PT have any chronic conditions? (i.e. diabetes, asthma, etc.) ---No  Is this a behavioral health or substance abuse call? ---No     Guidelines    Guideline Title Affirmed Question Affirmed Notes  Tongue Swelling [1] Swelling of tongue is a recurrent problem AND [2] no swelling at present    Final Disposition User   See PCP When Office is Open (within 3 days) Joline Salt, Therapist, sports, Malachy Mood    Comments  NOTEMar[place]e Staff, RN triged patient --B. Michele Rockers, BSN RN entered documentation into Epic.   Referrals  GO TO FACILITY OTHER - SPECIFY   Disagree/Comply: Comply

## 2017-06-20 NOTE — Telephone Encounter (Signed)
Is this something pt can be seen for in office?

## 2017-06-20 NOTE — Telephone Encounter (Signed)
LVM informing pt she will need and office visit.

## 2017-06-20 NOTE — Telephone Encounter (Signed)
Yes she needs to be seen in office.

## 2017-06-21 ENCOUNTER — Encounter: Payer: Self-pay | Admitting: Internal Medicine

## 2017-06-21 ENCOUNTER — Ambulatory Visit (INDEPENDENT_AMBULATORY_CARE_PROVIDER_SITE_OTHER): Payer: 59 | Admitting: Internal Medicine

## 2017-06-21 VITALS — BP 154/110 | HR 99 | Ht 63.5 in | Wt 140.0 lb

## 2017-06-21 DIAGNOSIS — K1120 Sialoadenitis, unspecified: Secondary | ICD-10-CM | POA: Diagnosis not present

## 2017-06-21 DIAGNOSIS — I10 Essential (primary) hypertension: Secondary | ICD-10-CM | POA: Diagnosis not present

## 2017-06-21 DIAGNOSIS — R7303 Prediabetes: Secondary | ICD-10-CM

## 2017-06-21 MED ORDER — CLINDAMYCIN HCL 300 MG PO CAPS: 300.0000 mg | ORAL_CAPSULE | Freq: Three times a day (TID) | ORAL | 0 refills | Status: DC

## 2017-06-21 NOTE — Patient Instructions (Signed)
Please take all new medication as prescribed - the antibiotic  Please call for ENT referral if not starting to get better in 2-3 days  Please continue all other medications as before, and refills have been done if requested.  Please have the pharmacy call with any other refills you may need.  Please keep your appointments with your specialists as you may have planned

## 2017-06-23 ENCOUNTER — Telehealth: Payer: Self-pay | Admitting: Internal Medicine

## 2017-06-23 DIAGNOSIS — L0291 Cutaneous abscess, unspecified: Secondary | ICD-10-CM

## 2017-06-23 DIAGNOSIS — K1120 Sialoadenitis, unspecified: Secondary | ICD-10-CM

## 2017-06-23 NOTE — Telephone Encounter (Signed)
Patient states she not feeling any better. She would like the referral to the ENT. Patient was informed it can take 7 to 14 days before she gets a call with an appointment. Thank you.

## 2017-06-24 DIAGNOSIS — K1120 Sialoadenitis, unspecified: Secondary | ICD-10-CM | POA: Insufficient documentation

## 2017-06-24 NOTE — Assessment & Plan Note (Signed)
Mild to mod, for antibx course,  to f/u any worsening symptoms or concerns, for ENT if not improved in 2-3 days

## 2017-06-24 NOTE — Assessment & Plan Note (Signed)
Mod elevated today, has not taken BP meds in last 3 days due to difficulty swalling, I reassured her she should be able to restart this as well as the oral antibiotis, o/w stable overall by history and exam, recent data reviewed with pt, and pt to continue medical treatment as before,  to f/u any worsening symptoms or concerns BP Readings from Last 3 Encounters:  06/21/17 (!) 154/110  05/12/17 (!) 156/108  12/27/16 112/82

## 2017-06-24 NOTE — Progress Notes (Signed)
Subjective:    Patient ID: Michaela Rios, female    DOB: [birth year], 66 y.o.   MRN: [id number]  HPI  Here with new onset 2 days tender swelling area in a discrete area to right mid submandibular are, no overlying skin change but swelling somewhat extends to right sublingular salivery gland, assoc with temp 101 yesterday.  No sT, cough, sinus symptoms, HA or chills. Pt denies chest pain, increased sob or doe, wheezing, orthopnea, PND, increased LE swelling, palpitations, dizziness or syncope.   Pt denies polydipsia, polyuria Past Medical History:  Diagnosis Date  . Anemia   . Anxiety   . Arthritis   . Cataract   . Chronic migraine    follows with neuro for same  . Clotting disorder (HCC)    clot in ovarian vein- hx  . Esophagitis   . GERD (gastroesophageal reflux disease)   . Hypertension   . IBS (irritable bowel syndrome)   . Low back pain syndrome   . MRSA (methicillin resistant Staphylococcus aureus) infection 04/17/12   left torso  . Peptic ulcer disease 2003, 12/2012  . Peripheral vascular disease ([place])   . Vitamin D deficiency    Past Surgical History:  Procedure Laterality Date  . ABDOMINAL HYSTERECTOMY    . LAPAROSCOPIC BILATERAL SALPINGO OOPHERECTOMY    . pud surgery w/duod sticture-plasty and vagotomy  2003   Dr. Marlou Starks  . TONSILLECTOMY AND ADENOIDECTOMY     as a child  . TUBAL LIGATION  1984    reports that she has never smoked. She has never used smokeless tobacco. She reports that she drinks alcohol. She reports that she does not use drugs. family history includes Aneurysm in her father; Lung cancer in her brother; Prostate cancer in her brother. Allergies  Allergen Reactions  . Amoxicillin     REACTION: hives  . Cephalexin     REACTION: hives  . Lisinopril     REACTION: ? hx of ACE cough...   Current Outpatient Prescriptions on File Prior to Visit  Medication Sig Dispense Refill  . ALPRAZolam (XANAX) 0.5 MG tablet Take 1 tablet by mouth 3 times daily as  needed for anxiety 90 tablet 0  . calcium-vitamin D (OSCAL 500/200 D-3) 500-200 MG-UNIT per tablet Take 1 tablet by mouth twice daily     . clidinium-chlordiazePOXIDE (LIBRAX) 5-2.5 MG per capsule Take 1 capsule by mouth 2 (two) times daily as needed. 180 capsule 0  . estradiol (VIVELLE-DOT) 0.025 MG/24HR APPLY 1 PATCH TWICE WEEKLY  ONTO THE SKIN 24 patch 1  . felodipine (PLENDIL) 5 MG 24 hr tablet Take 1 tablet (5 mg total) by mouth daily. 90 tablet 3  . furosemide (LASIX) 20 MG tablet Take 1 tablet (20 mg total) by mouth daily. 90 tablet 1  . HYDROcodone-acetaminophen (NORCO) 7.5-325 MG tablet Take 1 tablet by mouth 3 (three) times daily as needed for moderate pain. 90 tablet 0  . hyoscyamine (LEVSIN SL) 0.125 MG SL tablet DISSOLVE 1 TABLET UNDER TONGUE EVERY 6 HOURS AS NEEDED 30 tablet 3  . meclizine (ANTIVERT) 25 MG tablet Take 1 tablet by mouth every 6 hours as needed for dizziness 90 tablet 3  . metoprolol succinate (TOPROL XL) 100 MG 24 hr tablet Take 1 tablet (100 mg total) by mouth daily. Take with or immediately following a meal. 90 tablet 3  . omeprazole (PRILOSEC) 20 MG capsule Take 1 capsule (20 mg total) by mouth daily. 90 capsule 3  . topiramate (TOPAMAX) 50  MG tablet Take 1 tablet (50 mg total) by mouth 2 (two) times daily. 180 tablet 3  . zolpidem (AMBIEN) 10 MG tablet TAKE 1 TABLET BY MOUTH AT BEDTIME AS NEEDED FOR SLEEP 30 tablet 0  . SUMAtriptan (IMITREX) 100 MG tablet Take 1 tablet by mouth  every 2 hours as needed for migraine. Max 2 tablets in  24 hours.    . SUMAtriptan (IMITREX) 20 MG/ACT nasal spray Place 1 spray into the nose.    . venlafaxine XR (EFFEXOR-XR) 75 MG 24 hr capsule Take 225 mg by mouth.     No current facility-administered medications on file prior to visit.    Review of Systems  Constitutional: Negative for other unusual diaphoresis or sweats HENT: Negative for ear discharge or swelling Eyes: Negative for other worsening visual  disturbances Respiratory: Negative for stridor or other swelling  Gastrointestinal: Negative for worsening distension or other blood Genitourinary: Negative for retention or other urinary change Musculoskeletal: Negative for other MSK pain or swelling Skin: Negative for color change or other new lesions Neurological: Negative for worsening tremors and other numbness  Psychiatric/Behavioral: Negative for worsening agitation or other fatigue All other system neg per pt    Objective:   Physical Exam BP (!) 154/110   Pulse 99   Ht 5' 3.5" (1.613 m)   Wt 140 lb (63.5 kg)   SpO2 98%   BMI 24.41 kg/m  VS noted, mild ill appearing Constitutional: Pt appears in NAD HENT: Head: NCAT.  Right Ear: External ear normal.  Left Ear: External ear normal. \ Eyes: . Pupils are equal, round, and reactive to light. Conjunctivae and EOM are normal Nose: without d/c or deformity + right mid submandicular 1.5 cm area marrked tender swelling including the right sublingual salivary gland 1+ red tender swelling, no skin change, no drainage  Neck: Neck supple. Gross normal ROM Cardiovascular: Normal rate and regular rhythm.   Pulmonary/Chest: Effort normal and breath sounds without rales or wheezing.  Neurological: Pt is alert. At baseline orientation, motor grossly intact Skin: Skin is warm. No rashes, other new lesions, no LE edema Psychiatric: Pt behavior is normal without agitation  No other exam findings    Assessment & Plan:

## 2017-06-24 NOTE — Assessment & Plan Note (Signed)
stable overall by history and exam, recent data reviewed with pt, and pt to continue medical treatment as before,  to f/u any worsening symptoms or concerns Lab Results  Component Value Date   HGBA1C 5.9 05/12/2017

## 2017-06-26 NOTE — Telephone Encounter (Signed)
[facility] for ENT - done as urgent to r/o abscess

## 2017-06-27 ENCOUNTER — Other Ambulatory Visit: Payer: Self-pay | Admitting: Internal Medicine

## 2017-06-27 NOTE — Telephone Encounter (Signed)
RX printed 

## 2017-06-27 NOTE — Telephone Encounter (Signed)
 Controlled Substance Database checked. Okay to fill RX. Last filled 05/24/17

## 2017-06-27 NOTE — Telephone Encounter (Signed)
Called pt no answer LMOM MD has placed the referral will received call bck w/appt info once referral has been set-up...Michaela Rios

## 2017-06-27 NOTE — Telephone Encounter (Signed)
Ok printed

## 2017-06-28 MED ORDER — ZOLPIDEM TARTRATE 10 MG PO TABS: 10.0000 mg | ORAL_TABLET | Freq: Every evening | ORAL | 3 refills | Status: DC | PRN

## 2017-06-28 MED ORDER — ALPRAZOLAM 0.5 MG PO TABS: ORAL_TABLET | ORAL | 0 refills | Status: DC

## 2017-06-28 NOTE — Telephone Encounter (Signed)
[facility]ed [facility]

## 2017-06-28 NOTE — Telephone Encounter (Signed)
Xanax sent in yesterday. [facility].

## 2017-09-29 ENCOUNTER — Other Ambulatory Visit: Payer: Self-pay | Admitting: Internal Medicine

## 2017-09-29 MED ORDER — ALPRAZOLAM 0.5 MG PO TABS: ORAL_TABLET | ORAL | 0 refills | Status: DC

## 2017-09-29 NOTE — Telephone Encounter (Signed)
Ok printed

## 2017-09-29 NOTE — Telephone Encounter (Signed)
[place] Controlled Substance Database checked. Last filled on 09/01/2017

## 2017-09-29 NOTE — Telephone Encounter (Signed)
[facility]ed to [facility]

## 2017-10-12 ENCOUNTER — Other Ambulatory Visit: Payer: Self-pay | Admitting: Gastroenterology

## 2017-10-12 NOTE — Telephone Encounter (Signed)
I have refilled it. Please have her see me by the end of January 2019 if she would like me to continue prescribing it. Or she can have PCP do it if they are comfortable with that and if more convenient for her.

## 2017-10-12 NOTE — Telephone Encounter (Signed)
Refill rx request for levsin 0.125mg  1 under tongue ever 6 hours as needed. Last seen 11-2016.

## 2017-11-01 ENCOUNTER — Other Ambulatory Visit: Payer: Self-pay | Admitting: Internal Medicine

## 2017-11-01 NOTE — Telephone Encounter (Signed)
[place] Controlled Substance Database checked. Last filled on 10/02/17

## 2017-11-13 NOTE — Progress Notes (Signed)
Subjective:    Patient ID: Michaela Rios, female    DOB: 06-24-1951, 66 y.o.   MRN: [id number]  HPI    Medications and allergies reviewed with patient and updated if appropriate.  Patient Active Problem List   Diagnosis Date Noted  . Sialadenitis 06/24/2017  . Insomnia 05/12/2017  . Prediabetes 11/08/2016  . Vitamin D deficiency 06/08/2009  . Depression 01/07/2009  . PEPTIC ULCER DISEASE, CHRONIC 01/07/2009  . OTHER OBSTRUCTION OF DUODENUM 01/07/2009  . HIATAL HERNIA 01/07/2009  . Irritable bowel syndrome 01/07/2009  . DEGENERATIVE JOINT DISEASE 01/07/2009  . ANEMIA, HX OF 01/07/2009  . PULMONARY NODULE 06/09/2008  . Anxiety 04/01/2008  . Essential hypertension 04/01/2008  . Allergic rhinitis 04/01/2008  . GERD 04/01/2008  . LOW BACK PAIN SYNDROME 04/01/2008  . Chronic migraine 04/01/2008    Current Outpatient Medications on File Prior to Visit  Medication Sig Dispense Refill  . ALPRAZolam (XANAX) 0.5 MG tablet TAKE 1 TABLET 3 TIMES A DAY AS NEEDED FOR ANXIETY 90 tablet 0  . calcium-vitamin D (OSCAL 500/200 D-3) 500-200 MG-UNIT per tablet Take 1 tablet by mouth twice daily     . clidinium-chlordiazePOXIDE (LIBRAX) 5-2.5 MG per capsule Take 1 capsule by mouth 2 (two) times daily as needed. 180 capsule 0  . clindamycin (CLEOCIN) 300 MG capsule Take 1 capsule (300 mg total) by mouth 3 (three) times daily. 30 capsule 0  . estradiol (VIVELLE-DOT) 0.025 MG/24HR APPLY 1 PATCH TWICE WEEKLY  ONTO THE SKIN 24 patch 1  . felodipine (PLENDIL) 5 MG 24 hr tablet Take 1 tablet (5 mg total) by mouth daily. 90 tablet 3  . furosemide (LASIX) 20 MG tablet Take 1 tablet (20 mg total) by mouth daily. 90 tablet 1  . HYDROcodone-acetaminophen (NORCO) 7.5-325 MG tablet Take 1 tablet by mouth 3 (three) times daily as needed for moderate pain. 90 tablet 0  . hyoscyamine (LEVSIN, ANASPAZ) 0.125 MG tablet DISSOLVE 1 TABLET UNDER TONGUE EVERY 6 HOURS AS NEEDED 30 tablet 1  . meclizine (ANTIVERT)  25 MG tablet Take 1 tablet by mouth every 6 hours as needed for dizziness 90 tablet 3  . metoprolol succinate (TOPROL XL) 100 MG 24 hr tablet Take 1 tablet (100 mg total) by mouth daily. Take with or immediately following a meal. 90 tablet 3  . omeprazole (PRILOSEC) 20 MG capsule Take 1 capsule (20 mg total) by mouth daily. 90 capsule 3  . SUMAtriptan (IMITREX) 100 MG tablet Take 1 tablet by mouth  every 2 hours as needed for migraine. Max 2 tablets in  24 hours.    . SUMAtriptan (IMITREX) 20 MG/ACT nasal spray Place 1 spray into the nose.    . topiramate (TOPAMAX) 50 MG tablet Take 1 tablet (50 mg total) by mouth 2 (two) times daily. 180 tablet 3  . venlafaxine XR (EFFEXOR-XR) 75 MG 24 hr capsule Take 225 mg by mouth.    . zolpidem (AMBIEN) 10 MG tablet TAKE 1 TABLET AT BEDTIME AS NEEDED FOR SLEEP 30 tablet 0   No current facility-administered medications on file prior to visit.     Past Medical History:  Diagnosis Date  . Anemia   . Anxiety   . Arthritis   . Cataract   . Chronic migraine    follows with neuro for same  . Clotting disorder (HCC)    clot in ovarian vein- hx  . Esophagitis   . GERD (gastroesophageal reflux disease)   . Hypertension   .  IBS (irritable bowel syndrome)   . Low back pain syndrome   . MRSA (methicillin resistant Staphylococcus aureus) infection 04/17/12   left torso  . Peptic ulcer disease 2003, 12/2012  . Peripheral vascular disease ([place])   . Vitamin D deficiency     Past Surgical History:  Procedure Laterality Date  . ABDOMINAL HYSTERECTOMY    . LAPAROSCOPIC BILATERAL SALPINGO OOPHERECTOMY    . pud surgery w/duod sticture-plasty and vagotomy  2003   Dr. Marlou Starks  . TONSILLECTOMY AND ADENOIDECTOMY     as a child  . TUBAL LIGATION  1984    Social History   Socioeconomic History  . Marital status: Married    Spouse name: Levada Dy  . Number of children: 1  . Years of education: Not on file  . Highest education level: Not on file  Social Needs  .  Financial resource strain: Not on file  . Food insecurity - worry: Not on file  . Food insecurity - inability: Not on file  . Transportation needs - medical: Not on file  . Transportation needs - non-medical: Not on file  Occupational History  . Occupation: [employer]    Employer: [employer]  Tobacco Use  . Smoking status: Never Smoker  . Smokeless tobacco: Never Used  Substance and Sexual Activity  . Alcohol use: Yes    Comment: rare  . Drug use: No  . Sexual activity: Not on file  Other Topics Concern  . Not on file  Social History Narrative   Lives with spouse    Grown son in [place], 2 g kids    Family History  Problem Relation Age of Onset  . Aneurysm Father   . Prostate cancer Brother   . Lung cancer Brother   . Colon cancer Neg Hx   . Esophageal cancer Neg Hx   . Rectal cancer Neg Hx   . Stomach cancer Neg Hx     Review of Systems     Objective:  There were no vitals filed for this visit. Wt Readings from Last 3 Encounters:  06/21/17 140 lb (63.5 kg)  05/12/17 140 lb (63.5 kg)  12/27/16 143 lb (64.9 kg)   There is no height or weight on file to calculate BMI.   Physical Exam         Assessment & Plan:    See Problem List for Assessment and Plan of chronic medical problems.   This encounter was created in error - please disregard.

## 2017-11-14 ENCOUNTER — Encounter: Payer: 59 | Admitting: Internal Medicine

## 2017-11-29 ENCOUNTER — Other Ambulatory Visit: Payer: Self-pay | Admitting: Internal Medicine

## 2017-11-29 NOTE — Progress Notes (Signed)
Subjective:    Patient ID: Michaela Rios, female    DOB: 07/02/1951, 67 y.o.   MRN: [id number]  HPI     Medications and allergies reviewed with patient and updated if appropriate.  Patient Active Problem List   Diagnosis Date Noted  . Sialadenitis 06/24/2017  . Insomnia 05/12/2017  . Prediabetes 11/08/2016  . Vitamin D deficiency 06/08/2009  . Depression 01/07/2009  . PEPTIC ULCER DISEASE, CHRONIC 01/07/2009  . OTHER OBSTRUCTION OF DUODENUM 01/07/2009  . HIATAL HERNIA 01/07/2009  . Irritable bowel syndrome 01/07/2009  . DEGENERATIVE JOINT DISEASE 01/07/2009  . ANEMIA, HX OF 01/07/2009  . PULMONARY NODULE 06/09/2008  . Anxiety 04/01/2008  . Essential hypertension 04/01/2008  . Allergic rhinitis 04/01/2008  . GERD 04/01/2008  . LOW BACK PAIN SYNDROME 04/01/2008  . Chronic migraine 04/01/2008    Current Outpatient Medications on File Prior to Visit  Medication Sig Dispense Refill  . ALPRAZolam (XANAX) 0.5 MG tablet TAKE 1 TABLET 3 TIMES A DAY AS NEEDED FOR ANXIETY 90 tablet 0  . calcium-vitamin D (OSCAL 500/200 D-3) 500-200 MG-UNIT per tablet Take 1 tablet by mouth twice daily     . clidinium-chlordiazePOXIDE (LIBRAX) 5-2.5 MG per capsule Take 1 capsule by mouth 2 (two) times daily as needed. 180 capsule 0  . felodipine (PLENDIL) 5 MG 24 hr tablet Take 1 tablet (5 mg total) by mouth daily. 90 tablet 3  . furosemide (LASIX) 20 MG tablet Take 1 tablet (20 mg total) by mouth daily. 90 tablet 1  . HYDROcodone-acetaminophen (NORCO) 7.5-325 MG tablet Take 1 tablet by mouth 3 (three) times daily as needed for moderate pain. 90 tablet 0  . hyoscyamine (LEVSIN, ANASPAZ) 0.125 MG tablet DISSOLVE 1 TABLET UNDER TONGUE EVERY 6 HOURS AS NEEDED 30 tablet 1  . meclizine (ANTIVERT) 25 MG tablet Take 1 tablet by mouth every 6 hours as needed for dizziness 90 tablet 3  . metoprolol succinate (TOPROL XL) 100 MG 24 hr tablet Take 1 tablet (100 mg total) by mouth daily. Take with or  immediately following a meal. 90 tablet 3  . omeprazole (PRILOSEC) 20 MG capsule Take 1 capsule (20 mg total) by mouth daily. 90 capsule 3  . SUMAtriptan (IMITREX) 100 MG tablet Take 1 tablet by mouth  every 2 hours as needed for migraine. Max 2 tablets in  24 hours.    . SUMAtriptan (IMITREX) 20 MG/ACT nasal spray Place 1 spray into the nose.    . topiramate (TOPAMAX) 50 MG tablet Take 1 tablet (50 mg total) by mouth 2 (two) times daily. 180 tablet 3  . venlafaxine XR (EFFEXOR-XR) 75 MG 24 hr capsule Take 225 mg by mouth.    Marland Kitchen VIVELLE-DOT 0.025 MG/24HR APPLY 1 PATCH TWICE WEEKLY  ONTO THE SKIN 24 patch 1  . zolpidem (AMBIEN) 10 MG tablet TAKE 1 TABLET AT BEDTIME AS NEEDED FOR SLEEP 30 tablet 0   No current facility-administered medications on file prior to visit.     Past Medical History:  Diagnosis Date  . Anemia   . Anxiety   . Arthritis   . Cataract   . Chronic migraine    follows with neuro for same  . Clotting disorder (HCC)    clot in ovarian vein- hx  . Esophagitis   . GERD (gastroesophageal reflux disease)   . Hypertension   . IBS (irritable bowel syndrome)   . Low back pain syndrome   . MRSA (methicillin resistant Staphylococcus aureus) infection 04/17/12  left torso  . Peptic ulcer disease 2003, 12/2012  . Peripheral vascular disease ([place])   . Vitamin D deficiency     Past Surgical History:  Procedure Laterality Date  . ABDOMINAL HYSTERECTOMY    . LAPAROSCOPIC BILATERAL SALPINGO OOPHERECTOMY    . pud surgery w/duod sticture-plasty and vagotomy  2003   Dr. Marlou Starks  . TONSILLECTOMY AND ADENOIDECTOMY     as a child  . TUBAL LIGATION  1984    Social History   Socioeconomic History  . Marital status: Married    Spouse name: Levada Dy  . Number of children: 1  . Years of education: Not on file  . Highest education level: Not on file  Social Needs  . Financial resource strain: Not on file  . Food insecurity - worry: Not on file  . Food insecurity - inability: Not  on file  . Transportation needs - medical: Not on file  . Transportation needs - non-medical: Not on file  Occupational History  . Occupation: [employer]    Employer: [employer]  Tobacco Use  . Smoking status: Never Smoker  . Smokeless tobacco: Never Used  Substance and Sexual Activity  . Alcohol use: Yes    Comment: rare  . Drug use: No  . Sexual activity: Not on file  Other Topics Concern  . Not on file  Social History Narrative   Lives with spouse    Grown son in [place], 2 g kids    Family History  Problem Relation Age of Onset  . Aneurysm Father   . Prostate cancer Brother   . Lung cancer Brother   . Colon cancer Neg Hx   . Esophageal cancer Neg Hx   . Rectal cancer Neg Hx   . Stomach cancer Neg Hx     Review of Systems     Objective:  There were no vitals filed for this visit. Wt Readings from Last 3 Encounters:  06/21/17 140 lb (63.5 kg)  05/12/17 140 lb (63.5 kg)  12/27/16 143 lb (64.9 kg)   There is no height or weight on file to calculate BMI.   Physical Exam         Assessment & Plan:    See Problem List for Assessment and Plan of chronic medical problems.   This encounter was created in error - please disregard.

## 2017-11-30 ENCOUNTER — Encounter: Payer: 59 | Admitting: Internal Medicine

## 2017-12-01 ENCOUNTER — Other Ambulatory Visit: Payer: Self-pay | Admitting: Internal Medicine

## 2017-12-01 MED ORDER — ZOLPIDEM TARTRATE 10 MG PO TABS: 10.0000 mg | ORAL_TABLET | Freq: Every evening | ORAL | 0 refills | Status: DC | PRN

## 2017-12-01 NOTE — Telephone Encounter (Signed)
rq to rf [facility]

## 2017-12-01 NOTE — Telephone Encounter (Signed)
Copied from [place]. Topic: Quick Communication - Rx Refill/Question >> Dec 01, 2017 12:15 PM [place], Laban Emperor wrote: Has the patient contacted their pharmacy? No.   (Agent: If no, request that the patient contact the pharmacy for the refill.)   Preferred Pharmacy (with phone number or street name): [id number] [place], [place] [phone] (Phone) [phone] (Fax)  Pt is requesting a med refill on her zolpidem (AMBIEN) 10 MG tablet [[id number]], states she only has 1 left. Would like to know if she can get a refill to hold her until her appt on 12/04/17?   Agent: Please be advised that RX refills may take up to 3 business days. We ask that you follow-up with your pharmacy.

## 2017-12-01 NOTE — Telephone Encounter (Signed)
[place] Controlled Substance Database checked. Last filled on 11/01/17. Pt No showed for appt yesterday

## 2017-12-03 NOTE — Patient Instructions (Addendum)
  Test(s) ordered today. Your results will be released to MyChart (or called to you) after review, usually within 72hours after test completion. If any changes need to be made, you will be notified at that same time.  Medications reviewed and updated.  No changes recommended at this time.  Your prescription(s) have been submitted to your pharmacy. Please take as directed and contact our office if you believe you are having problem(s) with the medication(s).    Please followup in 3 months   

## 2017-12-03 NOTE — Progress Notes (Signed)
Subjective:    Patient ID: Michaela Rios, female    DOB: 08-21-1951, 67 y.o.   MRN: [id number]  HPI The patient is here for follow up.  Sialadenitis:  She has parotid gland, tongue swelling and pain.  She has needed antibiotics.  She saw ENT and prescribed stronger antibiotics.  No other treatment was needed.  She has passed very small stones, but has not had other severe instances of inflammation/swelling.   She has intermittent ankle swelling.  It started two months ago.  She denies SOB with activity, but this is not new.  She denies chest pain.    Hypertension: She is taking her medication daily. She is compliant with a low sodium diet.  She denies chest pain, palpitations. She is exercising regularly - just started riding an exercise bike.  She does monitor her blood pressure at home - 124/80's.    Prediabetes:  She is not compliant with a low sugar/carbohydrate diet.  She is exercising regularly - just started riding an exercise bike.  Anxiety: She is taking her medication daily as prescribed. She also takes the xanax as needed.  She denies any side effects from the medication. She feels her anxiety is well controlled and she is happy with her current dose of medication.   Depression: She is taking her medication daily as prescribed. She denies any side effects from the medication. She feels her depression is better, but some days does not feel well controlled.  She thinks some of the reason it is not controlled is the time of year - coldness and more dreary.    Migraine headaches/chronic headaches:  She is following with Dr Sima Matas.  She may be staring one of the new injection medications.  No other medication has worked.  She has daily headaches.  She takes the vicodin as needed - it is not covered by insurance, but she did pay for it and it was not too expensive.  She only takes it as needed, but would like to continue to take it as needed - it does help with the severe headaches.    Medications and allergies reviewed with patient and updated if appropriate.  Patient Active Problem List   Diagnosis Date Noted  . Sialadenitis, right parotid 06/24/2017  . Insomnia 05/12/2017  . Prediabetes 11/08/2016  . Vitamin D deficiency 06/08/2009  . Depression 01/07/2009  . PEPTIC ULCER DISEASE, CHRONIC 01/07/2009  . OTHER OBSTRUCTION OF DUODENUM 01/07/2009  . HIATAL HERNIA 01/07/2009  . Irritable bowel syndrome 01/07/2009  . DEGENERATIVE JOINT DISEASE 01/07/2009  . ANEMIA, HX OF 01/07/2009  . PULMONARY NODULE 06/09/2008  . Anxiety 04/01/2008  . Essential hypertension 04/01/2008  . Allergic rhinitis 04/01/2008  . GERD 04/01/2008  . LOW BACK PAIN SYNDROME 04/01/2008  . Chronic migraine 04/01/2008    Current Outpatient Medications on File Prior to Visit  Medication Sig Dispense Refill  . ALPRAZolam (XANAX) 0.5 MG tablet TAKE 1 TABLET 3 TIMES A DAY AS NEEDED FOR ANXIETY 90 tablet 0  . calcium-vitamin D (OSCAL 500/200 D-3) 500-200 MG-UNIT per tablet Take 1 tablet by mouth twice daily     . clidinium-chlordiazePOXIDE (LIBRAX) 5-2.5 MG per capsule Take 1 capsule by mouth 2 (two) times daily as needed. 180 capsule 0  . cyclobenzaprine (FLEXERIL) 10 MG tablet     . felodipine (PLENDIL) 5 MG 24 hr tablet Take 1 tablet (5 mg total) by mouth daily. 90 tablet 3  . furosemide (LASIX) 20 MG tablet Take  1 tablet (20 mg total) by mouth daily. 90 tablet 1  . hyoscyamine (LEVSIN, ANASPAZ) 0.125 MG tablet DISSOLVE 1 TABLET UNDER TONGUE EVERY 6 HOURS AS NEEDED 30 tablet 1  . meclizine (ANTIVERT) 25 MG tablet Take 1 tablet by mouth every 6 hours as needed for dizziness 90 tablet 3  . metoprolol succinate (TOPROL XL) 100 MG 24 hr tablet Take 1 tablet (100 mg total) by mouth daily. Take with or immediately following a meal. 90 tablet 3  . omeprazole (PRILOSEC) 20 MG capsule Take 1 capsule (20 mg total) by mouth daily. 90 capsule 3  . tiZANidine (ZANAFLEX) 4 MG tablet     . topiramate  (TOPAMAX) 50 MG tablet Take 1 tablet (50 mg total) by mouth 2 (two) times daily. 180 tablet 3  . VIVELLE-DOT 0.025 MG/24HR APPLY 1 PATCH TWICE WEEKLY  ONTO THE SKIN 24 patch 1  . zolpidem (AMBIEN) 10 MG tablet Take 1 tablet (10 mg total) by mouth at bedtime as needed. for sleep 30 tablet 0  . zonisamide (ZONEGRAN) 25 MG capsule     . SUMAtriptan (IMITREX) 100 MG tablet Take 1 tablet by mouth  every 2 hours as needed for migraine. Max 2 tablets in  24 hours.    . SUMAtriptan (IMITREX) 20 MG/ACT nasal spray Place 1 spray into the nose.    . venlafaxine XR (EFFEXOR-XR) 75 MG 24 hr capsule Take 225 mg by mouth.     No current facility-administered medications on file prior to visit.     Past Medical History:  Diagnosis Date  . Anemia   . Anxiety   . Arthritis   . Cataract   . Chronic migraine    follows with neuro for same  . Clotting disorder (HCC)    clot in ovarian vein- hx  . Esophagitis   . GERD (gastroesophageal reflux disease)   . Hypertension   . IBS (irritable bowel syndrome)   . Low back pain syndrome   . MRSA (methicillin resistant Staphylococcus aureus) infection 04/17/12   left torso  . Peptic ulcer disease 2003, 12/2012  . Peripheral vascular disease ([place])   . Vitamin D deficiency     Past Surgical History:  Procedure Laterality Date  . ABDOMINAL HYSTERECTOMY    . LAPAROSCOPIC BILATERAL SALPINGO OOPHERECTOMY    . pud surgery w/duod sticture-plasty and vagotomy  2003   Dr. Marlou Starks  . TONSILLECTOMY AND ADENOIDECTOMY     as a child  . TUBAL LIGATION  1984    Social History   Socioeconomic History  . Marital status: Married    Spouse name: Levada Dy  . Number of children: 1  . Years of education: None  . Highest education level: None  Social Needs  . Financial resource strain: None  . Food insecurity - worry: None  . Food insecurity - inability: None  . Transportation needs - medical: None  . Transportation needs - non-medical: None  Occupational History  .  Occupation: [employer]    Employer: [employer]  Tobacco Use  . Smoking status: Never Smoker  . Smokeless tobacco: Never Used  Substance and Sexual Activity  . Alcohol use: Yes    Comment: rare  . Drug use: No  . Sexual activity: None  Other Topics Concern  . None  Social History Narrative   Lives with spouse    Grown son in [place], 2 g kids    Family History  Problem Relation Age of Onset  .  Aneurysm Father   . Prostate cancer Brother   . Lung cancer Brother   . Colon cancer Neg Hx   . Esophageal cancer Neg Hx   . Rectal cancer Neg Hx   . Stomach cancer Neg Hx     Review of Systems  Constitutional: Negative for chills and fever.  Respiratory: Positive for cough (occ, dry cough) and shortness of breath (with activity). Negative for wheezing.   Cardiovascular: Positive for leg swelling (intermittent x 2 months). Negative for chest pain and palpitations.  Neurological: Positive for headaches (chronic). Negative for light-headedness.       Objective:   Vitals:   12/04/17 1445  BP: (!) 172/114  Pulse: (!) 105  Resp: 16  Temp: 98.8 F (37.1 C)  SpO2: 95%   Wt Readings from Last 3 Encounters:  12/04/17 153 lb (69.4 kg)  06/21/17 140 lb (63.5 kg)  05/12/17 140 lb (63.5 kg)   Body mass index is 26.68 kg/m.   Physical Exam    Constitutional: Appears well-developed and well-nourished. No distress.  HENT:  Head: Normocephalic and atraumatic.  Neck: Neck supple. No tracheal deviation present. No thyromegaly present.  No cervical lymphadenopathy Cardiovascular: Normal rate, regular rhythm and normal heart sounds.   No murmur heard. No carotid bruit .  No edema Pulmonary/Chest: Effort normal and breath sounds normal. No respiratory distress. No has no wheezes. No rales.  Skin: Skin is warm and dry. Not diaphoretic.  Psychiatric: Normal mood and affect. Behavior is normal.      Assessment & Plan:    See Problem List for Assessment and Plan of chronic  medical problems.

## 2017-12-04 ENCOUNTER — Ambulatory Visit: Payer: Managed Care, Other (non HMO) | Admitting: Internal Medicine

## 2017-12-04 ENCOUNTER — Encounter: Payer: Self-pay | Admitting: Internal Medicine

## 2017-12-04 ENCOUNTER — Other Ambulatory Visit: Payer: Self-pay | Admitting: Internal Medicine

## 2017-12-04 VITALS — BP 172/114 | HR 105 | Temp 98.8°F | Resp 16 | Wt 153.0 lb

## 2017-12-04 DIAGNOSIS — Z79890 Hormone replacement therapy (postmenopausal): Secondary | ICD-10-CM | POA: Diagnosis not present

## 2017-12-04 DIAGNOSIS — F419 Anxiety disorder, unspecified: Secondary | ICD-10-CM | POA: Diagnosis not present

## 2017-12-04 DIAGNOSIS — F3289 Other specified depressive episodes: Secondary | ICD-10-CM | POA: Diagnosis not present

## 2017-12-04 DIAGNOSIS — K1120 Sialoadenitis, unspecified: Secondary | ICD-10-CM | POA: Diagnosis not present

## 2017-12-04 DIAGNOSIS — I10 Essential (primary) hypertension: Secondary | ICD-10-CM | POA: Diagnosis not present

## 2017-12-04 DIAGNOSIS — R7303 Prediabetes: Secondary | ICD-10-CM | POA: Diagnosis not present

## 2017-12-04 DIAGNOSIS — G4700 Insomnia, unspecified: Secondary | ICD-10-CM

## 2017-12-04 DIAGNOSIS — IMO0002 Reserved for concepts with insufficient information to code with codable children: Secondary | ICD-10-CM

## 2017-12-04 DIAGNOSIS — G43709 Chronic migraine without aura, not intractable, without status migrainosus: Secondary | ICD-10-CM

## 2017-12-04 MED ORDER — METOPROLOL SUCCINATE ER 100 MG PO TB24: 100.0000 mg | ORAL_TABLET | Freq: Every day | ORAL | 3 refills | Status: DC

## 2017-12-04 MED ORDER — HYDROCODONE-ACETAMINOPHEN 7.5-325 MG PO TABS: 1.0000 | ORAL_TABLET | Freq: Three times a day (TID) | ORAL | 0 refills | Status: DC | PRN

## 2017-12-04 MED ORDER — ALPRAZOLAM 0.5 MG PO TABS: ORAL_TABLET | ORAL | 1 refills | Status: DC

## 2017-12-04 MED ORDER — FUROSEMIDE 20 MG PO TABS: 20.0000 mg | ORAL_TABLET | Freq: Every day | ORAL | 1 refills | Status: DC

## 2017-12-04 NOTE — Assessment & Plan Note (Signed)
Fairly controlled Continue effexor and xanax as needed

## 2017-12-04 NOTE — Assessment & Plan Note (Signed)
Somewhat controlled, but not ideally controlled Discussed decreasing effexor and trying something different - she does not want to do this at this time, but will consider it if there is no improvement

## 2017-12-04 NOTE — Assessment & Plan Note (Addendum)
Daily headaches Following with Dr Sima Matas May try one of the new migraine prevention medications imitrex as needed Will restart hydrocodone prn  - I will prescribe.  She will only take it as needed - I will give her a short term rx until she can return in 3 months for pain management visit/ pain contract signing Tizanidine as needed which helps

## 2017-12-04 NOTE — Assessment & Plan Note (Signed)
BP elevated here, but has been better controlled at home She will start monitoring her BP at home more regularly - call if elevated Continue current medications cmp

## 2017-12-04 NOTE — Assessment & Plan Note (Signed)
Applying vivell-dot once a week - ? Causing increase in her headaches/migraines She is approaching the age where she should get off even being on the low dose Will try to stop taking it -- may need vaginal estrogen cream for vaginal dryness

## 2017-12-04 NOTE — Assessment & Plan Note (Signed)
Check a1c Low sugar / carb diet Stressed regular exercise   

## 2017-12-04 NOTE — Assessment & Plan Note (Signed)
No active symptoms Has seen ENT

## 2017-12-04 NOTE — Assessment & Plan Note (Signed)
Taking ambien as needed

## 2018-01-04 ENCOUNTER — Other Ambulatory Visit: Payer: Self-pay | Admitting: Internal Medicine

## 2018-01-04 NOTE — Telephone Encounter (Signed)
[place] Controlled Substance Database checked. Last filled on 12/01/17

## 2018-02-05 ENCOUNTER — Other Ambulatory Visit: Payer: Self-pay | Admitting: Internal Medicine

## 2018-02-05 NOTE — Telephone Encounter (Signed)
[place] Controlled Substance Database checked. Last filled on 01/04/18

## 2018-02-07 ENCOUNTER — Other Ambulatory Visit: Payer: Self-pay | Admitting: Internal Medicine

## 2018-02-07 MED ORDER — HYDROCODONE-ACETAMINOPHEN 7.5-325 MG PO TABS: 1.0000 | ORAL_TABLET | Freq: Three times a day (TID) | ORAL | 0 refills | Status: DC | PRN

## 2018-02-07 NOTE — Telephone Encounter (Signed)
[place] Controlled Substance Database checked. Last filled on 12/04/2017

## 2018-03-04 NOTE — Progress Notes (Signed)
Subjective:    Patient ID: Michaela Rios, female    DOB: Apr 01, 1951, 67 y.o.   MRN: [id number]  HPI The patient is here for follow up.  Chronic pain management:  Indication for chronic opioid: chronic migraines Medication and dose: hydrocodone-acetaminophen 7.5-325 mg  # pills per month: 15 Last UDS date: 03/05/18 Pain contract signed (Y/N): 03/05/18 Date narcotic database last reviewed (include red flags): 03/05/18 --no red flags.  Pain assessment:  Pain intensity: 10/10  With migraines, 0/10 w/o migraines Amount of pain relief with medication:  Relieves pain almost completely Use of pain medications:  Takes medication about 2/ month Side effects:  none Sleep:  Taking ambien at night - unable to sleep w/o it Mood:  Anxiety and depression controlled with effexor - taking xanax up to three times a day Functional/social activities: continues to be active in home/work setting   Last took prescribed pain medication:  Alcohol use: rarely drinks Tobacco use:   none Street Drug use:  None   Insomnia:  She is taking the ambien nightly.  She is unable to sleep w/o the [place].    Hypertension: She is taking her medication daily. She is compliant with a low sodium diet.  She denies chest pain, palpitations, edema, shortness of breath. She is exercising regularly - stationary bike.  She does monitor her blood pressure at home - 144/94, 165/?[place] Kitchen  She is concerned that her blood pressure is not well controlled.  Anxiety: She is taking her medication daily as prescribed. She denies any side effects from the medication. She feels her anxiety is well controlled and she is happy with her current dose of medication.   Depression: She is taking her medication daily as prescribed. She denies any side effects from the medication. She feels her depression is well controlled and she is happy with her current dose of medication.   Dysuria: She states intermittent dysuria.  She takes something  over-the-counter to help with her symptoms.  This relieves the symptoms temporarily.  Medications and allergies reviewed with patient and updated if appropriate.  Patient Active Problem List   Diagnosis Date Noted  . Hormone replacement therapy (HRT) 12/04/2017  . Sialadenitis, right parotid 06/24/2017  . Insomnia 05/12/2017  . Prediabetes 11/08/2016  . Vitamin D deficiency 06/08/2009  . Depression 01/07/2009  . PEPTIC ULCER DISEASE, CHRONIC 01/07/2009  . OTHER OBSTRUCTION OF DUODENUM 01/07/2009  . HIATAL HERNIA 01/07/2009  . Irritable bowel syndrome 01/07/2009  . DEGENERATIVE JOINT DISEASE 01/07/2009  . ANEMIA, HX OF 01/07/2009  . PULMONARY NODULE 06/09/2008  . Anxiety 04/01/2008  . Essential hypertension 04/01/2008  . Allergic rhinitis 04/01/2008  . GERD 04/01/2008  . LOW BACK PAIN SYNDROME 04/01/2008  . Chronic migraine 04/01/2008    Current Outpatient Medications on File Prior to Visit  Medication Sig Dispense Refill  . ALPRAZolam (XANAX) 0.5 MG tablet TAKE 1 TABLET 3 TIMES A DAY AS NEEDED FOR ANXIETY 90 tablet 1  . calcium-vitamin D (OSCAL 500/200 D-3) 500-200 MG-UNIT per tablet Take 1 tablet by mouth twice daily     . clidinium-chlordiazePOXIDE (LIBRAX) 5-2.5 MG per capsule Take 1 capsule by mouth 2 (two) times daily as needed. 180 capsule 0  . cyclobenzaprine (FLEXERIL) 10 MG tablet     . felodipine (PLENDIL) 5 MG 24 hr tablet Take 1 tablet (5 mg total) by mouth daily. 90 tablet 3  . furosemide (LASIX) 20 MG tablet Take 1 tablet (20 mg total) by mouth daily. 90 tablet  1  . HYDROcodone-acetaminophen (NORCO) 7.5-325 MG tablet Take 1 tablet by mouth 3 (three) times daily as needed for severe pain (headaches). 15 tablet 0  . hyoscyamine (LEVSIN, ANASPAZ) 0.125 MG tablet DISSOLVE 1 TABLET UNDER TONGUE EVERY 6 HOURS AS NEEDED 30 tablet 1  . meclizine (ANTIVERT) 25 MG tablet Take 1 tablet by mouth every 6 hours as needed for dizziness 90 tablet 3  . metoprolol succinate (TOPROL  XL) 100 MG 24 hr tablet Take 1 tablet (100 mg total) by mouth daily. Take with or immediately following a meal. 90 tablet 3  . omeprazole (PRILOSEC) 20 MG capsule Take 1 capsule (20 mg total) by mouth daily. 90 capsule 3  . tiZANidine (ZANAFLEX) 4 MG tablet     . VIVELLE-DOT 0.025 MG/24HR APPLY 1 PATCH TWICE WEEKLY  ONTO THE SKIN 24 patch 1  . zolpidem (AMBIEN) 10 MG tablet TAKE 1 TABLET (10 MG TOTAL) BY MOUTH AT BEDTIME AS NEEDED. FOR SLEEP 30 tablet 1  . SUMAtriptan (IMITREX) 100 MG tablet Take 1 tablet by mouth  every 2 hours as needed for migraine. Max 2 tablets in  24 hours.    . SUMAtriptan (IMITREX) 20 MG/ACT nasal spray Place 1 spray into the nose.    . venlafaxine XR (EFFEXOR-XR) 75 MG 24 hr capsule Take 225 mg by mouth.     No current facility-administered medications on file prior to visit.     Past Medical History:  Diagnosis Date  . Anemia   . Anxiety   . Arthritis   . Cataract   . Chronic migraine    follows with neuro for same  . Clotting disorder (HCC)    clot in ovarian vein- hx  . Esophagitis   . GERD (gastroesophageal reflux disease)   . Hypertension   . IBS (irritable bowel syndrome)   . Low back pain syndrome   . MRSA (methicillin resistant Staphylococcus aureus) infection 04/17/12   left torso  . Peptic ulcer disease 2003, 12/2012  . Peripheral vascular disease ([place])   . Vitamin D deficiency     Past Surgical History:  Procedure Laterality Date  . ABDOMINAL HYSTERECTOMY    . LAPAROSCOPIC BILATERAL SALPINGO OOPHERECTOMY    . pud surgery w/duod sticture-plasty and vagotomy  2003   Dr. Marlou Starks  . TONSILLECTOMY AND ADENOIDECTOMY     as a child  . TUBAL LIGATION  1984    Social History   Socioeconomic History  . Marital status: Married    Spouse name: Levada Dy  . Number of children: 1  . Years of education: Not on file  . Highest education level: Not on file  Occupational History  . Occupation: [employer]    Employer: [employer]   . Financial resource strain: Not on file  . Food insecurity:    Worry: Not on file    Inability: Not on file  . Transportation needs:    Medical: Not on file    Non-medical: Not on file  Tobacco Use  . Smoking status: Never Smoker  . Smokeless tobacco: Never Used  Substance and Sexual Activity  . Alcohol use: Yes    Comment: rare  . Drug use: No  . Sexual activity: Not on file  Lifestyle  . Physical activity:    Days per week: Not on file    Minutes per session: Not on file  . Stress: Not on file  Relationships  . Social connections:    Talks  on phone: Not on file    Gets together: Not on file    Attends religious service: Not on file    Active member of club or organization: Not on file    Attends meetings of clubs or organizations: Not on file    Relationship status: Not on file  Other Topics Concern  . Not on file  Social History Narrative   Lives with spouse    Grown son in [place], 2 g kids    Family History  Problem Relation Age of Onset  . Aneurysm Father   . Prostate cancer Brother   . Lung cancer Brother   . Colon cancer Neg Hx   . Esophageal cancer Neg Hx   . Rectal cancer Neg Hx   . Stomach cancer Neg Hx     Review of Systems  Constitutional: Negative for chills and fever.  Respiratory: Negative for cough, shortness of breath and wheezing.   Cardiovascular: Negative for chest pain, palpitations and leg swelling.  Neurological: Positive for light-headedness (occ) and headaches (migraines, occ tension).       Objective:   Vitals:   03/05/18 1413  BP: (!) 144/104  Pulse: 96  Resp: 16  Temp: 98.3 F (36.8 C)  SpO2: 94%   BP Readings from Last 3 Encounters:  03/05/18 (!) 144/104  12/04/17 (!) 172/114  06/21/17 (!) 154/110   Wt Readings from Last 3 Encounters:  03/05/18 151 lb (68.5 kg)  12/04/17 153 lb (69.4 kg)  06/21/17 140 lb (63.5 kg)   Body mass index is 26.33 kg/m.   Physical Exam    Constitutional: Appears well-developed and  well-nourished. No distress.  HENT:  Head: Normocephalic and atraumatic.  Neck: Neck supple. No tracheal deviation present. No thyromegaly present.  No cervical lymphadenopathy Cardiovascular: Normal rate, regular rhythm and normal heart sounds.   No murmur heard. No carotid bruit .  No edema Pulmonary/Chest: Effort normal and breath sounds normal. No respiratory distress. No has no wheezes. No rales.  Skin: Skin is warm and dry. Not diaphoretic.  Psychiatric: Normal mood and affect. Behavior is normal.      Assessment & Plan:    45 minutes were spent face-to-face with the patient reviewing several chronic medical problems, addressing uncontrolled hypertension, dysuria and discussing chronic pain management.     See Problem List for Assessment and Plan of chronic medical problems.

## 2018-03-05 ENCOUNTER — Ambulatory Visit: Payer: Managed Care, Other (non HMO) | Admitting: Internal Medicine

## 2018-03-05 ENCOUNTER — Encounter: Payer: Self-pay | Admitting: Internal Medicine

## 2018-03-05 ENCOUNTER — Other Ambulatory Visit (INDEPENDENT_AMBULATORY_CARE_PROVIDER_SITE_OTHER): Payer: Managed Care, Other (non HMO)

## 2018-03-05 ENCOUNTER — Encounter: Payer: Self-pay | Admitting: Emergency Medicine

## 2018-03-05 VITALS — BP 144/104 | HR 96 | Temp 98.3°F | Resp 16 | Wt 151.0 lb

## 2018-03-05 DIAGNOSIS — F419 Anxiety disorder, unspecified: Secondary | ICD-10-CM

## 2018-03-05 DIAGNOSIS — R52 Pain, unspecified: Secondary | ICD-10-CM

## 2018-03-05 DIAGNOSIS — I10 Essential (primary) hypertension: Secondary | ICD-10-CM

## 2018-03-05 DIAGNOSIS — R300 Dysuria: Secondary | ICD-10-CM | POA: Insufficient documentation

## 2018-03-05 DIAGNOSIS — G43709 Chronic migraine without aura, not intractable, without status migrainosus: Secondary | ICD-10-CM

## 2018-03-05 DIAGNOSIS — F3289 Other specified depressive episodes: Secondary | ICD-10-CM

## 2018-03-05 DIAGNOSIS — R413 Other amnesia: Secondary | ICD-10-CM

## 2018-03-05 DIAGNOSIS — R7303 Prediabetes: Secondary | ICD-10-CM | POA: Diagnosis not present

## 2018-03-05 DIAGNOSIS — G4700 Insomnia, unspecified: Secondary | ICD-10-CM | POA: Diagnosis not present

## 2018-03-05 DIAGNOSIS — IMO0002 Reserved for concepts with insufficient information to code with codable children: Secondary | ICD-10-CM

## 2018-03-05 LAB — LIPID PANEL
CHOL/HDL RATIO: 3
CHOLESTEROL: 137 mg/dL (ref 0–200)
HDL: 45.6 mg/dL (ref >39.00)
LDL Cholesterol: 73 mg/dL (ref 0–99)
NonHDL: 91.72
TRIGLYCERIDES: 95 mg/dL (ref 0.0–149.0)
VLDL: 19 mg/dL (ref 0.0–40.0)

## 2018-03-05 LAB — COMPREHENSIVE METABOLIC PANEL
ALBUMIN: 4.2 g/dL (ref 3.5–5.2)
ALT: 24 U/L (ref 0–35)
AST: 33 U/L (ref 0–37)
Alkaline Phosphatase: 76 U/L (ref 39–117)
BUN: 17 mg/dL (ref 6–23)
CALCIUM: 9.7 mg/dL (ref 8.4–10.5)
CO2: 32 meq/L (ref 19–32)
CREATININE: 1.31 mg/dL — AB (ref 0.40–1.20)
Chloride: 103 mEq/L (ref 96–112)
GFR: 42.99 mL/min — ABNORMAL LOW (ref >60.00)
Glucose, Bld: 86 mg/dL (ref 70–99)
Potassium: 4.1 mEq/L (ref 3.5–5.1)
Sodium: 142 mEq/L (ref 135–145)
TOTAL PROTEIN: 8 g/dL (ref 6.0–8.3)
Total Bilirubin: 0.4 mg/dL (ref 0.2–1.2)

## 2018-03-05 LAB — URINALYSIS, ROUTINE W REFLEX MICROSCOPIC
BILIRUBIN URINE: NEGATIVE
KETONES UR: NEGATIVE
NITRITE: POSITIVE — AB
Specific Gravity, Urine: 1.015 (ref 1.000–1.030)
Total Protein, Urine: NEGATIVE
Urine Glucose: NEGATIVE
Urobilinogen, UA: 0.2 (ref 0.0–1.0)
pH: 6 (ref 5.0–8.0)

## 2018-03-05 LAB — HEMOGLOBIN A1C: Hgb A1c MFr Bld: 5.6 % (ref 4.6–6.5)

## 2018-03-05 MED ORDER — CANDESARTAN CILEXETIL 8 MG PO TABS: 8.0000 mg | ORAL_TABLET | Freq: Every day | ORAL | 5 refills | Status: DC

## 2018-03-05 MED ORDER — HYDROCODONE-ACETAMINOPHEN 7.5-325 MG PO TABS: 1.0000 | ORAL_TABLET | Freq: Three times a day (TID) | ORAL | 0 refills | Status: DC | PRN

## 2018-03-05 NOTE — Assessment & Plan Note (Signed)
Controlled, stable Continue current dose of medication  

## 2018-03-05 NOTE — Patient Instructions (Addendum)
Give a urine sample today -   Test(s) ordered today. Your results will be released to [place] (or called to you) after review, usually within 72hours after test completion. If any changes need to be made, you will be notified at that same time.   Medications reviewed and updated.  Changes include starting candesartan for your BP.   Monitor your BP at home - ideal is less than 140/90.    Your prescription(s) have been submitted to your pharmacy. Please take as directed and contact our office if you believe you are having problem(s) with the medication(s).   Please followup in 3 months

## 2018-03-05 NOTE — Assessment & Plan Note (Signed)
She is concerned that she is experiencing some memory difficulties She states her husband does not seem to be concerned if She is interested in having further evaluation regarding this-it is very concerning to her Will refer to neurology for neuropsych evaluation

## 2018-03-05 NOTE — Assessment & Plan Note (Signed)
Unable to sleep without Ambien Has been on Ambien for years and is tolerating it well Continue current dose

## 2018-03-05 NOTE — Assessment & Plan Note (Signed)
Not controlled Continue current dose of metoprolol-unable to increase due to low heart rate Start Atacand 8 mg daily-we will increase if needed Advised her to send me a note via my chart with blood pressure measures from home Follow-up in 3 months, sooner if needed

## 2018-03-05 NOTE — Assessment & Plan Note (Addendum)
Taking imitrex as needed Takes norco about twice a month - takes 1/2 and then will take other 1/2 if needed Following with neurology - Dr Sima Matas  Many medications have not worked - she has tried several preventative medications and none have worked.  imitrex is effective, headaches require norco about 2/ month Will continue norco as needed - she only takes it when needed and it is effective.

## 2018-03-05 NOTE — Assessment & Plan Note (Signed)
Indication for chronic opioid: chronic migraines Medication and dose: hydrocodone-acetaminophen 7.5-325 mg  # pills per month: 15 Last UDS date: 03/05/18 Pain contract signed (Y/N): 03/05/18 Date narcotic database last reviewed (include red flags): 03/05/18 --no red flags.  Taking medication appropriately-only takes Norco twice a month on average Refill today

## 2018-03-05 NOTE — Assessment & Plan Note (Signed)
Experiences frequent dysuria Takes over-the-counter medication for symptom relief Check urinalysis, urine culture

## 2018-03-06 ENCOUNTER — Other Ambulatory Visit: Payer: Self-pay | Admitting: Internal Medicine

## 2018-03-06 MED ORDER — ZOLPIDEM TARTRATE 10 MG PO TABS: 10.0000 mg | ORAL_TABLET | Freq: Every evening | ORAL | 1 refills | Status: DC | PRN

## 2018-03-06 NOTE — Telephone Encounter (Signed)
[place] Controlled Substance Database checked. Last filled on 02/05/18 

## 2018-03-07 ENCOUNTER — Encounter: Payer: Self-pay | Admitting: Psychology

## 2018-03-08 ENCOUNTER — Other Ambulatory Visit: Payer: Self-pay | Admitting: Internal Medicine

## 2018-03-08 LAB — PAIN MGMT, PROFILE 8 W/CONF, U
6 Acetylmorphine: NEGATIVE ng/mL (ref <10)
ALPHAHYDROXYALPRAZOLAM: 351 ng/mL — AB (ref <25)
ALPHAHYDROXYMIDAZOLAM: NEGATIVE ng/mL (ref <50)
ALPHAHYDROXYTRIAZOLAM: NEGATIVE ng/mL (ref <50)
AMINOCLONAZEPAM: NEGATIVE ng/mL (ref <25)
Alcohol Metabolites: NEGATIVE ng/mL (ref <500)
Amphetamines: NEGATIVE ng/mL (ref <500)
BENZODIAZEPINES: POSITIVE ng/mL — AB (ref <100)
BUPRENORPHINE, URINE: NEGATIVE ng/mL (ref <5)
Cocaine Metabolite: NEGATIVE ng/mL (ref <150)
Creatinine: 59.8 mg/dL
Hydroxyethylflurazepam: NEGATIVE ng/mL (ref <50)
Lorazepam: NEGATIVE ng/mL (ref <50)
MARIJUANA METABOLITE: NEGATIVE ng/mL (ref <20)
MDMA: NEGATIVE ng/mL (ref <500)
Nordiazepam: NEGATIVE ng/mL (ref <50)
OXAZEPAM: NEGATIVE ng/mL (ref <50)
OXYCODONE: NEGATIVE ng/mL (ref <100)
Opiates: NEGATIVE ng/mL (ref <100)
Oxidant: NEGATIVE ug/mL (ref <200)
TEMAZEPAM: NEGATIVE ng/mL (ref <50)
pH: 7 (ref 4.5–9.0)

## 2018-03-08 LAB — URINE CULTURE
MICRO NUMBER:: 90429722
SPECIMEN QUALITY:: ADEQUATE

## 2018-03-08 MED ORDER — SULFAMETHOXAZOLE-TRIMETHOPRIM 800-160 MG PO TABS: 1.0000 | ORAL_TABLET | Freq: Two times a day (BID) | ORAL | 0 refills | Status: DC

## 2018-04-05 ENCOUNTER — Other Ambulatory Visit: Payer: Self-pay | Admitting: Internal Medicine

## 2018-04-06 NOTE — Telephone Encounter (Signed)
[place] Controlled Substance Database checked. Last filled on 03/06/18 

## 2018-04-13 ENCOUNTER — Other Ambulatory Visit: Payer: Self-pay | Admitting: Internal Medicine

## 2018-04-13 MED ORDER — HYDROCODONE-ACETAMINOPHEN 7.5-325 MG PO TABS: 1.0000 | ORAL_TABLET | Freq: Three times a day (TID) | ORAL | 0 refills | Status: DC | PRN

## 2018-04-13 NOTE — Telephone Encounter (Signed)
[place] Controlled Substance Database checked. Last filled on 03/05/18 

## 2018-05-08 ENCOUNTER — Other Ambulatory Visit: Payer: Self-pay | Admitting: Internal Medicine

## 2018-05-08 NOTE — Telephone Encounter (Signed)
[place] Controlled Substance Database checked. Last filled on 04/05/18 

## 2018-06-03 DIAGNOSIS — Z0289 Encounter for other administrative examinations: Secondary | ICD-10-CM | POA: Insufficient documentation

## 2018-06-03 NOTE — Progress Notes (Signed)
Subjective:    Patient ID: Michaela Rios, female    DOB: 06/29/1951, 67 y.o.   MRN: [id number]  HPI The patient is here for follow up.  The patient is here for follow up for chronic pain management.  Indication for chronic opioid: chronic migraines Medication and dose: vicodin  7.5-325 mg # pills per month: 15 Last UDS date: 03/05/2018 Pain contract signed (Y/N): 03/05/2018 Date narcotic database last reviewed (include red flags): 06/04/18, no red flags  Pain assessment:  Pain intensity: 10/10 with migraines, 0/10 after medication  Amount of pain relief with medication:   significant Use of pain medications: uses for migraines only about 2/month Side effects:   none Sleep:   Sleeps well with ambien Mood:   Has depression and anxiety - controlled with effexor and xanax Functional/social activities: continues to be active in home/work setting   Last took prescribed pain medication:  Last week Alcohol use:   Drinks occasionally Tobacco use: none Street Drug use:  None   Decreased kidney function:  She drinks a lot of water during the day.  She does not take any nsaids.  She monitors her BP at home and it is controlled.    Hypertension: She is taking her medication daily. She is compliant with a low sodium diet.  She denies chest pain, palpitations, edema, shortness of breath and regular headaches.    She does monitor her blood pressure at home once a week - 135/85-90.  Marland Kitchen    Left leg pain and left hip pain:  She has had this pain for months.  It throbs. It goes from the hip to the lower leg.  She has some tingling, but no numbness or weakness in the leg.     Medications and allergies reviewed with patient and updated if appropriate.  Patient Active Problem List   Diagnosis Date Noted  . Pain management contract signed 06/03/2018  . Encounter for pain management 03/05/2018  . Memory difficulties 03/05/2018  . Dysuria 03/05/2018  . Hormone replacement therapy (HRT)  12/04/2017  . Sialadenitis, right parotid 06/24/2017  . Insomnia 05/12/2017  . Prediabetes 11/08/2016  . Vitamin D deficiency 06/08/2009  . Depression 01/07/2009  . PEPTIC ULCER DISEASE, CHRONIC 01/07/2009  . OTHER OBSTRUCTION OF DUODENUM 01/07/2009  . HIATAL HERNIA 01/07/2009  . Irritable bowel syndrome 01/07/2009  . DEGENERATIVE JOINT DISEASE 01/07/2009  . ANEMIA, HX OF 01/07/2009  . PULMONARY NODULE 06/09/2008  . Anxiety 04/01/2008  . Essential hypertension 04/01/2008  . Allergic rhinitis 04/01/2008  . GERD 04/01/2008  . LOW BACK PAIN SYNDROME 04/01/2008  . Chronic migraine 04/01/2008    Current Outpatient Medications on File Prior to Visit  Medication Sig Dispense Refill  . ALPRAZolam (XANAX) 0.5 MG tablet TAKE 1 TABLET 3 TIMES A DAY AS NEEDED FOR ANXIETY 90 tablet 1  . calcium-vitamin D (OSCAL 500/200 D-3) 500-200 MG-UNIT per tablet Take 1 tablet by mouth twice daily     . candesartan (ATACAND) 8 MG tablet Take 1 tablet (8 mg total) by mouth daily. 30 tablet 5  . clidinium-chlordiazePOXIDE (LIBRAX) 5-2.5 MG per capsule Take 1 capsule by mouth 2 (two) times daily as needed. 180 capsule 0  . cyclobenzaprine (FLEXERIL) 10 MG tablet     . furosemide (LASIX) 20 MG tablet Take 1 tablet (20 mg total) by mouth daily. 90 tablet 1  . HYDROcodone-acetaminophen (NORCO) 7.5-325 MG tablet Take 1 tablet by mouth 3 (three) times daily as needed for severe pain (headaches).  15 tablet 0  . hyoscyamine (LEVSIN, ANASPAZ) 0.125 MG tablet DISSOLVE 1 TABLET UNDER TONGUE EVERY 6 HOURS AS NEEDED 30 tablet 1  . meclizine (ANTIVERT) 25 MG tablet Take 1 tablet by mouth every 6 hours as needed for dizziness 90 tablet 3  . metoprolol succinate (TOPROL XL) 100 MG 24 hr tablet Take 1 tablet (100 mg total) by mouth daily. Take with or immediately following a meal. 90 tablet 3  . omeprazole (PRILOSEC) 20 MG capsule Take 1 capsule (20 mg total) by mouth daily. 90 capsule 3  . sulfamethoxazole-trimethoprim  (BACTRIM DS,SEPTRA DS) 800-160 MG tablet Take 1 tablet by mouth 2 (two) times daily. 10 tablet 0  . tiZANidine (ZANAFLEX) 4 MG tablet     . VIVELLE-DOT 0.025 MG/24HR APPLY 1 PATCH TWICE WEEKLY  ONTO THE SKIN 24 patch 1  . zolpidem (AMBIEN) 10 MG tablet TAKE 1 TABLET BY MOUTH AT BEDTIME AS NEEDED. FOR SLEEP 30 tablet 1  . SUMAtriptan (IMITREX) 100 MG tablet Take 1 tablet by mouth  every 2 hours as needed for migraine. Max 2 tablets in  24 hours.    . SUMAtriptan (IMITREX) 20 MG/ACT nasal spray Place 1 spray into the nose.    . venlafaxine XR (EFFEXOR-XR) 75 MG 24 hr capsule Take 225 mg by mouth.     No current facility-administered medications on file prior to visit.     Past Medical History:  Diagnosis Date  . Anemia   . Anxiety   . Arthritis   . Cataract   . Chronic migraine    follows with neuro for same  . Clotting disorder (HCC)    clot in ovarian vein- hx  . Esophagitis   . GERD (gastroesophageal reflux disease)   . Hypertension   . IBS (irritable bowel syndrome)   . Low back pain syndrome   . MRSA (methicillin resistant Staphylococcus aureus) infection 04/17/12   left torso  . Peptic ulcer disease 2003, 12/2012  . Peripheral vascular disease ([place])   . Vitamin D deficiency     Past Surgical History:  Procedure Laterality Date  . ABDOMINAL HYSTERECTOMY    . LAPAROSCOPIC BILATERAL SALPINGO OOPHERECTOMY    . pud surgery w/duod sticture-plasty and vagotomy  2003   Dr. Marlou Starks  . TONSILLECTOMY AND ADENOIDECTOMY     as a child  . TUBAL LIGATION  1984    Social History   Socioeconomic History  . Marital status: Married    Spouse name: Levada Dy  . Number of children: 1  . Years of education: Not on file  . Highest education level: Not on file  Occupational History  . Occupation: [employer]    Employer: [employer]  . Financial resource strain: Not on file  . Food insecurity:    Worry: Not on file    Inability: Not on file  . Transportation  needs:    Medical: Not on file    Non-medical: Not on file  Tobacco Use  . Smoking status: Never Smoker  . Smokeless tobacco: Never Used  Substance and Sexual Activity  . Alcohol use: Yes    Comment: rare  . Drug use: No  . Sexual activity: Not on file  Lifestyle  . Physical activity:    Days per week: Not on file    Minutes per session: Not on file  . Stress: Not on file  Relationships  . Social connections:    Talks on phone: Not on file  Gets together: Not on file    Attends religious service: Not on file    Active member of club or organization: Not on file    Attends meetings of clubs or organizations: Not on file    Relationship status: Not on file  Other Topics Concern  . Not on file  Social History Narrative   Lives with spouse    Grown son in [place], 2 g kids    Family History  Problem Relation Age of Onset  . Aneurysm Father   . Prostate cancer Brother   . Lung cancer Brother   . Colon cancer Neg Hx   . Esophageal cancer Neg Hx   . Rectal cancer Neg Hx   . Stomach cancer Neg Hx     Review of Systems  Constitutional: Negative for chills and fever.  Respiratory: Negative for cough, shortness of breath and wheezing.   Cardiovascular: Negative for chest pain, palpitations and leg swelling.  Musculoskeletal: Negative for back pain.       Left hip pain with radiation down left leg  Neurological: Positive for headaches. Negative for weakness and numbness (tingling in left leg).       Objective:   Vitals:   06/04/18 0901  BP: (!) 156/100  Pulse: 75  Resp: 16  Temp: 98.5 F (36.9 C)  SpO2: 96%   BP Readings from Last 3 Encounters:  06/04/18 (!) 156/100  03/05/18 (!) 144/104  12/04/17 (!) 172/114   Wt Readings from Last 3 Encounters:  06/04/18 153 lb (69.4 kg)  03/05/18 151 lb (68.5 kg)  12/04/17 153 lb (69.4 kg)   Body mass index is 26.68 kg/m.   Physical Exam    Constitutional: Appears well-developed and well-nourished. No distress.    HENT:  Head: Normocephalic and atraumatic.  Neck: Neck supple. No tracheal deviation present. No thyromegaly present.  No cervical lymphadenopathy Cardiovascular: Normal rate, regular rhythm and normal heart sounds.   No murmur heard. No carotid bruit .  No edema Pulmonary/Chest: Effort normal and breath sounds normal. No respiratory distress. No has no wheezes. No rales.  Msk: no lower back pain Neuro: no weakness or decreased sensation in b/l LE Skin: Skin is warm and dry. Not diaphoretic.  Psychiatric: Normal mood and affect. Behavior is normal.      Assessment & Plan:    See Problem List for Assessment and Plan of chronic medical problems.

## 2018-06-03 NOTE — Assessment & Plan Note (Addendum)
Indication for chronic opioid: chronic migraines Medication and dose: hydrocodone-acetaminophen 7.5-325 mg  # pills per month: 15 Last UDS date: 03/05/18 Pain contract signed (Y/N): 03/05/18 Date narcotic database reviewed today - no red flags, rx refilled  Continue current dose of norco - last resort for treatment of migraines.  She is taking the medication appropriately Refilled today

## 2018-06-03 NOTE — Patient Instructions (Addendum)
  Test(s) ordered today. Your results will be released to [facility] (or called to you) after review, usually within 72hours after test completion. If any changes need to be made, you will be notified at that same time.   Medications reviewed and updated.  Changes include increasing candesartan to 16 mg daily.  We will also try gabapentin at night for your leg pain.    Your prescription(s) have been submitted to your pharmacy. Please take as directed and contact our office if you believe you are having problem(s) with the medication(s).  A referral was ordered for orthopedics.    Please followup in 3 months

## 2018-06-04 ENCOUNTER — Other Ambulatory Visit (INDEPENDENT_AMBULATORY_CARE_PROVIDER_SITE_OTHER): Payer: Medicare HMO

## 2018-06-04 ENCOUNTER — Ambulatory Visit (INDEPENDENT_AMBULATORY_CARE_PROVIDER_SITE_OTHER): Payer: Medicare HMO | Admitting: Internal Medicine

## 2018-06-04 ENCOUNTER — Encounter: Payer: Self-pay | Admitting: Internal Medicine

## 2018-06-04 VITALS — BP 156/100 | HR 75 | Temp 98.5°F | Resp 16 | Wt 153.0 lb

## 2018-06-04 DIAGNOSIS — R944 Abnormal results of kidney function studies: Secondary | ICD-10-CM | POA: Diagnosis not present

## 2018-06-04 DIAGNOSIS — I10 Essential (primary) hypertension: Secondary | ICD-10-CM

## 2018-06-04 DIAGNOSIS — G43709 Chronic migraine without aura, not intractable, without status migrainosus: Secondary | ICD-10-CM | POA: Diagnosis not present

## 2018-06-04 DIAGNOSIS — R52 Pain, unspecified: Secondary | ICD-10-CM

## 2018-06-04 DIAGNOSIS — IMO0002 Reserved for concepts with insufficient information to code with codable children: Secondary | ICD-10-CM

## 2018-06-04 DIAGNOSIS — M5416 Radiculopathy, lumbar region: Secondary | ICD-10-CM

## 2018-06-04 DIAGNOSIS — N183 Chronic kidney disease, stage 3 unspecified: Secondary | ICD-10-CM | POA: Insufficient documentation

## 2018-06-04 LAB — COMPREHENSIVE METABOLIC PANEL
ALBUMIN: 4.2 g/dL (ref 3.5–5.2)
ALK PHOS: 76 U/L (ref 39–117)
ALT: 20 U/L (ref 0–35)
AST: 23 U/L (ref 0–37)
BILIRUBIN TOTAL: 0.3 mg/dL (ref 0.2–1.2)
BUN: 24 mg/dL — AB (ref 6–23)
CO2: 29 mEq/L (ref 19–32)
Calcium: 9.7 mg/dL (ref 8.4–10.5)
Chloride: 104 mEq/L (ref 96–112)
Creatinine, Ser: 1.01 mg/dL (ref 0.40–1.20)
GFR: 57.99 mL/min — AB (ref >60.00)
GLUCOSE: 81 mg/dL (ref 70–99)
Potassium: 3.9 mEq/L (ref 3.5–5.1)
Sodium: 140 mEq/L (ref 135–145)
TOTAL PROTEIN: 7.7 g/dL (ref 6.0–8.3)

## 2018-06-04 MED ORDER — CANDESARTAN CILEXETIL 16 MG PO TABS: 16.0000 mg | ORAL_TABLET | Freq: Every day | ORAL | 1 refills | Status: DC

## 2018-06-04 MED ORDER — FUROSEMIDE 20 MG PO TABS: 20.0000 mg | ORAL_TABLET | Freq: Every day | ORAL | 1 refills | Status: DC

## 2018-06-04 MED ORDER — HYDROCODONE-ACETAMINOPHEN 7.5-325 MG PO TABS: 1.0000 | ORAL_TABLET | Freq: Three times a day (TID) | ORAL | 0 refills | Status: DC | PRN

## 2018-06-04 MED ORDER — METOPROLOL SUCCINATE ER 100 MG PO TB24: 100.0000 mg | ORAL_TABLET | Freq: Every day | ORAL | 3 refills | Status: DC

## 2018-06-04 MED ORDER — GABAPENTIN 100 MG PO CAPS: ORAL_CAPSULE | ORAL | 3 refills | Status: DC

## 2018-06-04 NOTE — Assessment & Plan Note (Signed)
Decreased Cr/ GFR at last visit cmp today Continue increased water intake Does not take any nsaids

## 2018-06-04 NOTE — Assessment & Plan Note (Signed)
Increase atacand to 16 mg daily Continue other medication Monitor BP at home cmp

## 2018-06-04 NOTE — Assessment & Plan Note (Signed)
New Refer to ortho - has had a similar episode in the past Start gabapentin at night - titrate as tolerated Deferred PT

## 2018-06-04 NOTE — Assessment & Plan Note (Addendum)
Migraines less frequent but very intense - has 14 bad migraines a month and daily headaches Following with neurology Takes hydrocodone well - last resort Continue current migraine treatment regimen - hydrocodone is last resort

## 2018-06-06 ENCOUNTER — Encounter: Payer: Self-pay | Admitting: Internal Medicine

## 2018-06-06 DIAGNOSIS — R69 Illness, unspecified: Secondary | ICD-10-CM | POA: Diagnosis not present

## 2018-06-08 ENCOUNTER — Other Ambulatory Visit: Payer: Self-pay | Admitting: Internal Medicine

## 2018-06-08 NOTE — Telephone Encounter (Signed)
[place] Controlled Substance Database checked. Last filled on 05/08/18 

## 2018-06-12 DIAGNOSIS — H16222 Keratoconjunctivitis sicca, not specified as Sjogren's, left eye: Secondary | ICD-10-CM | POA: Diagnosis not present

## 2018-06-13 MED ORDER — CANDESARTAN CILEXETIL 16 MG PO TABS: 16.0000 mg | ORAL_TABLET | Freq: Every day | ORAL | 1 refills | Status: DC

## 2018-06-13 MED ORDER — METOPROLOL SUCCINATE ER 100 MG PO TB24: 100.0000 mg | ORAL_TABLET | Freq: Every day | ORAL | 3 refills | Status: DC

## 2018-06-13 MED ORDER — FUROSEMIDE 20 MG PO TABS: 20.0000 mg | ORAL_TABLET | Freq: Every day | ORAL | 1 refills | Status: DC

## 2018-06-13 MED ORDER — GABAPENTIN 100 MG PO CAPS: ORAL_CAPSULE | ORAL | 3 refills | Status: DC

## 2018-06-13 NOTE — Addendum Note (Signed)
Addended by: Terence Lux B on: 06/13/2018 01:41 PM   Modules accepted: Orders

## 2018-06-27 ENCOUNTER — Other Ambulatory Visit: Payer: Self-pay | Admitting: Internal Medicine

## 2018-06-28 MED ORDER — GABAPENTIN 100 MG PO CAPS: 300.0000 mg | ORAL_CAPSULE | Freq: Every day | ORAL | 0 refills | Status: DC

## 2018-06-28 NOTE — Addendum Note (Signed)
Addended by: Terence Lux B on: 06/28/2018 09:06 AM   Modules accepted: Orders

## 2018-07-08 ENCOUNTER — Other Ambulatory Visit: Payer: Self-pay | Admitting: Internal Medicine

## 2018-07-09 NOTE — Telephone Encounter (Signed)
[place] Controlled Substance Database checked. Last filled on 06/08/18

## 2018-07-11 ENCOUNTER — Telehealth: Payer: Self-pay | Admitting: Emergency Medicine

## 2018-07-11 NOTE — Telephone Encounter (Signed)
PA completed for Ambien. Key [id number]. Awaiting response.

## 2018-07-16 NOTE — Telephone Encounter (Signed)
PA status N/A. Contacted insurance, states medication is approved through the end of the year. Contacted pharmacy to inform.

## 2018-07-23 DIAGNOSIS — R69 Illness, unspecified: Secondary | ICD-10-CM | POA: Diagnosis not present

## 2018-08-07 ENCOUNTER — Other Ambulatory Visit: Payer: Self-pay | Admitting: Internal Medicine

## 2018-08-07 MED ORDER — HYDROCODONE-ACETAMINOPHEN 7.5-325 MG PO TABS: 1.0000 | ORAL_TABLET | Freq: Three times a day (TID) | ORAL | 0 refills | Status: DC | PRN

## 2018-08-07 NOTE — Telephone Encounter (Signed)
Last OV was 06/04/18 Next OV is 09/07/18  Last refill 06/04/18

## 2018-08-09 ENCOUNTER — Encounter: Payer: Managed Care, Other (non HMO) | Admitting: Psychology

## 2018-08-11 ENCOUNTER — Encounter: Payer: Self-pay | Admitting: Internal Medicine

## 2018-08-11 ENCOUNTER — Other Ambulatory Visit: Payer: Self-pay | Admitting: Internal Medicine

## 2018-08-13 NOTE — Telephone Encounter (Signed)
[place] Controlled Substance Database checked. Last filled on 07/08/18  Last OV  06/04/18 Next OV  09/07/18

## 2018-09-03 ENCOUNTER — Encounter: Payer: Managed Care, Other (non HMO) | Admitting: Psychology

## 2018-09-06 NOTE — Progress Notes (Signed)
Subjective:    Patient ID: Michaela Rios, female    DOB: November 29, 1950, 67 y.o.   MRN: [id number]  HPI The patient is here for follow up for chronic pain management.  Indication for chronic opioid: chronic migraines Medication and dose: hydrocodone-acetaminophen 7.5-325 mg  # pills per month: 15 Last UDS date: 03/05/18 Pain contract signed (Y/N): 03/05/18 Date narcotic database last reviewed (include red flags): 09/07/18 w/o red flags  Pain assessment:  Pain intensity: 10 /10  With migraines Amount of pain relief with medication:  Significant relief Use of pain medications:  Uses about 2/month for migraines only Side effects:    none Sleep:  Michaela Rios - gets 6 hrs Mood:  Anxiety and depression - controlled with effexor and xanax Functional/social activities: continues to be active in home/work setting   Last took prescribed pain medication:  Last week Alcohol use: rare Tobacco use: no Street Drug use:    no    Hypertension: we increased her atacand at her last visit. She is taking her medication daily. She is compliant with a low sodium diet.  She denies chest pain, palpitations, edema, shortness of breath. She is not exercising regularly.       Prediabetes:  She is compliant with a low sugar/carbohydrate diet.  She is not exercising regularly.  Insomnia: She is taking Ambien nightly.  She gets approximately 6 hours of sleep and wakes very early and is unable to go back to sleep.  She is not currently exercising on a regular basis.  Medications and allergies reviewed with patient and updated if appropriate.  Patient Active Problem List   Diagnosis Date Noted  . Left lumbar radiculopathy 06/04/2018  . Decreased creatinine clearance 06/04/2018  . Pain management contract signed 06/03/2018  . Encounter for pain management 03/05/2018  . Memory difficulties 03/05/2018  . Hormone replacement therapy (HRT) 12/04/2017  . Sialadenitis, right parotid 06/24/2017  . Insomnia  05/12/2017  . Prediabetes 11/08/2016  . Vitamin D deficiency 06/08/2009  . Depression 01/07/2009  . PEPTIC ULCER DISEASE, CHRONIC 01/07/2009  . OTHER OBSTRUCTION OF DUODENUM 01/07/2009  . HIATAL HERNIA 01/07/2009  . Irritable bowel syndrome 01/07/2009  . DEGENERATIVE JOINT DISEASE 01/07/2009  . ANEMIA, HX OF 01/07/2009  . PULMONARY NODULE 06/09/2008  . Anxiety 04/01/2008  . Essential hypertension 04/01/2008  . Allergic rhinitis 04/01/2008  . GERD 04/01/2008  . LOW BACK PAIN SYNDROME 04/01/2008  . Chronic migraine 04/01/2008    Current Outpatient Medications on File Prior to Visit  Medication Sig Dispense Refill  . ALPRAZolam (XANAX) 0.5 MG tablet TAKE 1 TABLET 3 TIMES A DAY AS NEEDED FOR ANXIETY 90 tablet 1  . calcium-vitamin D (OSCAL 500/200 D-3) 500-200 MG-UNIT per tablet Take 1 tablet by mouth twice daily     . clidinium-chlordiazePOXIDE (LIBRAX) 5-2.5 MG per capsule Take 1 capsule by mouth 2 (two) times daily as needed. 180 capsule 0  . cyclobenzaprine (FLEXERIL) 10 MG tablet     . furosemide (LASIX) 20 MG tablet Take 1 tablet (20 mg total) by mouth daily. 90 tablet 1  . gabapentin (NEURONTIN) 100 MG capsule Take 3 capsules (300 mg total) by mouth at bedtime. 270 capsule 0  . HYDROcodone-acetaminophen (NORCO) 7.5-325 MG tablet Take 1 tablet by mouth 3 (three) times daily as needed for severe pain (headaches). 15 tablet 0  . hyoscyamine (LEVSIN, ANASPAZ) 0.125 MG tablet DISSOLVE 1 TABLET UNDER TONGUE EVERY 6 HOURS AS NEEDED 30 tablet 1  . meclizine (ANTIVERT) 25  MG tablet Take 1 tablet by mouth every 6 hours as needed for dizziness 90 tablet 3  . metoprolol succinate (TOPROL XL) 100 MG 24 hr tablet Take 1 tablet (100 mg total) by mouth daily. Take with or immediately following a meal. 90 tablet 3  . omeprazole (PRILOSEC) 20 MG capsule Take 1 capsule (20 mg total) by mouth daily. 90 capsule 3  . tiZANidine (ZANAFLEX) 4 MG tablet     . VIVELLE-DOT 0.025 MG/24HR APPLY 1 PATCH TWICE  WEEKLY  ONTO THE SKIN 24 patch 1  . SUMAtriptan (IMITREX) 100 MG tablet Take 1 tablet by mouth  every 2 hours as needed for migraine. Max 2 tablets in  24 hours.    . SUMAtriptan (IMITREX) 20 MG/ACT nasal spray Place 1 spray into the nose.    . venlafaxine XR (EFFEXOR-XR) 75 MG 24 hr capsule Take 225 mg by mouth.     No current facility-administered medications on file prior to visit.     Past Medical History:  Diagnosis Date  . Anemia   . Anxiety   . Arthritis   . Cataract   . Chronic migraine    follows with neuro for same  . Clotting disorder (HCC)    clot in ovarian vein- hx  . Esophagitis   . GERD (gastroesophageal reflux disease)   . Hypertension   . IBS (irritable bowel syndrome)   . Low back pain syndrome   . MRSA (methicillin resistant Staphylococcus aureus) infection 04/17/12   left torso  . Peptic ulcer disease 2003, 12/2012  . Peripheral vascular disease ([place])   . Vitamin D deficiency     Past Surgical History:  Procedure Laterality Date  . ABDOMINAL HYSTERECTOMY    . LAPAROSCOPIC BILATERAL SALPINGO OOPHERECTOMY    . pud surgery w/duod sticture-plasty and vagotomy  2003   Dr. Marlou Starks  . TONSILLECTOMY AND ADENOIDECTOMY     as a child  . TUBAL LIGATION  1984    Social History   Socioeconomic History  . Marital status: Married    Spouse name: Michaela Rios  . Number of children: 1  . Years of education: Not on file  . Highest education level: Not on file  Occupational History  . Occupation: [employer]    Employer: [employer]  . Financial resource strain: Not hard at all  . Food insecurity:    Worry: Never true    Inability: Never true  . Transportation needs:    Medical: No    Non-medical: No  Tobacco Use  . Smoking status: Never Smoker  . Smokeless tobacco: Never Used  Substance and Sexual Activity  . Alcohol use: Yes    Comment: rare  . Drug use: No  . Sexual activity: Not on file  Lifestyle  . Physical activity:    Days per  week: 0 days    Minutes per session: 0 min  . Stress: Only a little  Relationships  . Social connections:    Talks on phone: Never    Gets together: Not on file    Attends religious service: Not on file    Active member of club or organization: Not on file    Attends meetings of clubs or organizations: Not on file    Relationship status: Not on file  Other Topics Concern  . Not on file  Social History Narrative   Lives with spouse    Grown son in [place], 2 g kids  Family History  Problem Relation Age of Onset  . Aneurysm Father   . Prostate cancer Brother   . Lung cancer Brother   . Colon cancer Neg Hx   . Esophageal cancer Neg Hx   . Rectal cancer Neg Hx   . Stomach cancer Neg Hx     Review of Systems  Constitutional: Negative for chills and fever.  Respiratory: Negative for shortness of breath.   Cardiovascular: Negative for chest pain, palpitations and leg swelling.  Gastrointestinal: Negative for constipation.  Neurological: Positive for dizziness (sometimes), light-headedness (sometimes) and headaches.  Psychiatric/Behavioral: Positive for sleep disturbance. The patient is nervous/anxious.        Objective:   Vitals:   09/07/18 1058  BP: (!) 142/92  Pulse: 90  Resp: 16  Temp: 98.5 F (36.9 C)   BP Readings from Last 3 Encounters:  09/07/18 (!) 142/92  06/04/18 (!) 156/100  03/05/18 (!) 144/104   Wt Readings from Last 3 Encounters:  09/07/18 159 lb 12.8 oz (72.5 kg)  06/04/18 153 lb (69.4 kg)  03/05/18 151 lb (68.5 kg)   Body mass index is 27.86 kg/m.   Physical Exam    Constitutional: Appears well-developed and well-nourished. No distress.  HENT:  Head: Normocephalic and atraumatic.  Neck: Neck supple. No tracheal deviation present. No thyromegaly present.  No cervical lymphadenopathy Cardiovascular: Normal rate, regular rhythm and normal heart sounds.   No murmur heard. No carotid bruit .  No edema Pulmonary/Chest: Effort normal and breath  sounds normal. No respiratory distress. No has no wheezes. No rales.  Skin: Skin is warm and dry. Not diaphoretic.  Psychiatric: Normal mood and affect. Behavior is normal.      Assessment & Plan:    See Problem List for Assessment and Plan of chronic medical problems.

## 2018-09-06 NOTE — Progress Notes (Addendum)
Subjective:   Michaela Rios is a 67 y.o. female who presents for Medicare Annual (Subsequent) preventive examination.  Review of Systems:  No ROS.  Medicare Wellness Visit. Additional risk factors are reflected in the social history.  Cardiac Risk Factors include: advanced age (>43men, >20 women);hypertension Sleep patterns: has difficulty falling asleep, has interrupted sleep, gets up 1-2 times nightly to void and sleeps 5 hours nightly. Patient reports insomnia issues, discussed recommended sleep tips and stress reduction tips, education was provided via emmi.  Home Safety/Smoke Alarms: Feels safe in home. Smoke alarms in place.  Living environment; residence and Firearm Safety: 1-story house/ trailer, no firearms. Lives with husband, no needs for DME, good support system Seat Belt Safety/Bike Helmet: Wears seat belt.     Objective:     Vitals: BP (!) 142/92   Pulse 90   Ht 5\' 3"  (1.6 m)   Wt 159 lb (72.1 kg)   SpO2 98%   BMI 28.17 kg/m   Body mass index is 28.17 kg/m.  Advanced Directives 09/07/2018  Does Patient Have a Medical Advance Directive? Yes  Type of Paramedic of [place];Living will  Copy of [place] in Chart? No - copy requested    Tobacco Social History   Tobacco Use  Smoking Status Never Smoker  Smokeless Tobacco Never Used     Counseling given: Not Answered  Past Medical History:  Diagnosis Date  . Anemia   . Anxiety   . Arthritis   . Cataract   . Chronic migraine    follows with neuro for same  . Clotting disorder (HCC)    clot in ovarian vein- hx  . Esophagitis   . GERD (gastroesophageal reflux disease)   . Hypertension   . IBS (irritable bowel syndrome)   . Low back pain syndrome   . MRSA (methicillin resistant Staphylococcus aureus) infection 04/17/12   left torso  . Peptic ulcer disease 2003, 12/2012  . Peripheral vascular disease ([place])   . Vitamin D deficiency    Past Surgical  History:  Procedure Laterality Date  . ABDOMINAL HYSTERECTOMY    . LAPAROSCOPIC BILATERAL SALPINGO OOPHERECTOMY    . pud surgery w/duod sticture-plasty and vagotomy  2003   Dr. Marlou Starks  . TONSILLECTOMY AND ADENOIDECTOMY     as a child  . TUBAL LIGATION  1984   Family History  Problem Relation Age of Onset  . Aneurysm Father   . Prostate cancer Brother   . Lung cancer Brother   . Colon cancer Neg Hx   . Esophageal cancer Neg Hx   . Rectal cancer Neg Hx   . Stomach cancer Neg Hx    Social History   Socioeconomic History  . Marital status: Married    Spouse name: Levada Dy  . Number of children: 1  . Years of education: Not on file  . Highest education level: Not on file  Occupational History  . Occupation: retired  Scientific laboratory technician  . Financial resource strain: Not hard at all  . Food insecurity:    Worry: Never true    Inability: Never true  . Transportation needs:    Medical: No    Non-medical: No  Tobacco Use  . Smoking status: Never Smoker  . Smokeless tobacco: Never Used  Substance and Sexual Activity  . Alcohol use: Yes    Comment: rare  . Drug use: No  . Sexual activity: Yes  Lifestyle  . Physical activity:  Days per week: 0 days    Minutes per session: 0 min  . Stress: To some extent  Relationships  . Social connections:    Talks on phone: More than three times a week    Gets together: More than three times a week    Attends religious service: 1 to 4 times per year    Active member of club or organization: Yes    Attends meetings of clubs or organizations: 1 to 4 times per year    Relationship status: Married  Other Topics Concern  . Not on file  Social History Narrative   Lives with spouse    Grown son in [place], 2 g kids    Outpatient Encounter Medications as of 09/07/2018  Medication Sig  . ALPRAZolam (XANAX) 0.5 MG tablet TAKE 1 TABLET 3 TIMES A DAY AS NEEDED FOR ANXIETY  . calcium-vitamin D (OSCAL 500/200 D-3) 500-200 MG-UNIT per tablet Take 1 tablet  by mouth twice daily   . clidinium-chlordiazePOXIDE (LIBRAX) 5-2.5 MG per capsule Take 1 capsule by mouth 2 (two) times daily as needed.  . cyclobenzaprine (FLEXERIL) 10 MG tablet   . furosemide (LASIX) 20 MG tablet Take 1 tablet (20 mg total) by mouth daily.  Marland Kitchen gabapentin (NEURONTIN) 100 MG capsule Take 3 capsules (300 mg total) by mouth at bedtime.  Marland Kitchen HYDROcodone-acetaminophen (NORCO) 7.5-325 MG tablet Take 1 tablet by mouth 3 (three) times daily as needed for severe pain (headaches).  . hyoscyamine (LEVSIN, ANASPAZ) 0.125 MG tablet DISSOLVE 1 TABLET UNDER TONGUE EVERY 6 HOURS AS NEEDED  . meclizine (ANTIVERT) 25 MG tablet Take 1 tablet by mouth every 6 hours as needed for dizziness  . metoprolol succinate (TOPROL XL) 100 MG 24 hr tablet Take 1 tablet (100 mg total) by mouth daily. Take with or immediately following a meal.  . omeprazole (PRILOSEC) 20 MG capsule Take 1 capsule (20 mg total) by mouth daily.  . SUMAtriptan (IMITREX) 100 MG tablet Take 1 tablet by mouth  every 2 hours as needed for migraine. Max 2 tablets in  24 hours.  . SUMAtriptan (IMITREX) 20 MG/ACT nasal spray Place 1 spray into the nose.  Marland Kitchen tiZANidine (ZANAFLEX) 4 MG tablet   . venlafaxine XR (EFFEXOR-XR) 75 MG 24 hr capsule Take 225 mg by mouth.  Marland Kitchen VIVELLE-DOT 0.025 MG/24HR APPLY 1 PATCH TWICE WEEKLY  ONTO THE SKIN  . [DISCONTINUED] candesartan (ATACAND) 16 MG tablet Take 1 tablet (16 mg total) by mouth daily.  . [DISCONTINUED] zolpidem (AMBIEN) 10 MG tablet TAKE 1 TABLET BY MOUTH AT BEDTIME AS NEEDED. FOR SLEEP   No facility-administered encounter medications on file as of 09/07/2018.     Activities of Daily Living In your present state of health, do you have any difficulty performing the following activities: 09/07/2018  Hearing? N  Vision? N  Difficulty concentrating or making decisions? N  Walking or climbing stairs? N  Dressing or bathing? N  Doing errands, shopping? N  Preparing Food and eating ? N  Using  the Toilet? N  In the past six months, have you accidently leaked urine? N  Do you have problems with loss of bowel control? N  Managing your Medications? N  Managing your Finances? N  Housekeeping or managing your Housekeeping? N  Some recent data might be hidden    Patient Care Team: Binnie Rail, MD as PCP - General (Internal Medicine) Lafayette Dragon, MD (Inactive) (Gastroenterology) Amil Amen, MD (Neurology) Harold Hedge, Darrick Grinder, MD (  Allergy and Immunology) Alden Hipp, MD (Obstetrics and Gynecology)    Assessment:   This is a routine wellness examination for Sharra. Physical assessment deferred to PCP.   Exercise Activities and Dietary recommendations Current Exercise Habits: The patient does not participate in regular exercise at present, Exercise limited by: Other - see comments(chronic migraine headaches)  Diet (meal preparation, eat out, water intake, caffeinated beverages, dairy products, fruits and vegetables): in general, a "healthy" diet     Reviewed heart healthy diet. Encouraged patient to increase daily water and healthy fluid intake.  Goals    . Patient Stated     I want to increase my physical activity by joining [employer].       Fall Risk Fall Risk  09/07/2018 09/07/2018 05/09/2016 10/20/2015 05/14/2014  Falls in the past year? No No Yes Yes No  Number falls in past yr: - - 2 or more 2 or more -  Injury with Fall? - - No No -  Risk Factor Category  - - - High Fall Risk -  Risk for fall due to : - - Impaired balance/gait - -  Follow up - - - Education provided -   Depression Screen PHQ 2/9 Scores 09/07/2018 05/09/2016 05/14/2014  PHQ - 2 Score 4 0 0  PHQ- 9 Score 10 - -     Cognitive Function MMSE - Mini Mental State Exam 09/07/2018  Orientation to time 5  Orientation to Place 5  Registration 3  Attention/ Calculation 5  Recall 3  Language- name 2 objects 2  Language- repeat 1  Language- follow 3 step command 3  Language-  read & follow direction 1  Write a sentence 1  Copy design 1  Total score 30        Immunization History  Administered Date(s) Administered  . Tdap 06/21/2013   Screening Tests Health Maintenance  Topic Date Due  . MAMMOGRAM  08/10/2018  . INFLUENZA VACCINE  03/28/2019 (Originally 06/28/2018)  . PNA vac Low Risk Adult (1 of 2 - PCV13) 09/08/2019 (Originally 10/02/2015)  . DEXA SCAN  01/28/2019  . COLONOSCOPY  02/23/2023  . TETANUS/TDAP  06/22/2023  . Hepatitis C Screening  Completed      Plan:    Patient states she will call and make an appointment for screening mammogram.   Continue doing brain stimulating activities (puzzles, reading, adult coloring books, staying active) to keep memory sharp.   Continue to eat heart healthy diet (full of fruits, vegetables, whole grains, lean protein, water--limit salt, fat, and sugar intake) and increase physical activity as tolerated.  I have personally reviewed and noted the following in the patient's chart:   . Medical and social history . Use of alcohol, tobacco or illicit drugs  . Current medications and supplements . Functional ability and status . Nutritional status . Physical activity . Advanced directives . List of other physicians . Vitals . Screenings to include cognitive, depression, and falls . Referrals and appointments  In addition, I have reviewed and discussed with patient certain preventive protocols, quality metrics, and best practice recommendations. A written personalized care plan for preventive services as well as general preventive health recommendations were provided to patient.     Michiel Cowboy, RN  09/07/2018    Medical screening examination/treatment/procedure(s) were performed by non-physician practitioner and as supervising physician I was immediately available for consultation/collaboration. I agree with above. Binnie Rail, MD

## 2018-09-07 ENCOUNTER — Ambulatory Visit (INDEPENDENT_AMBULATORY_CARE_PROVIDER_SITE_OTHER): Payer: Medicare HMO | Admitting: Internal Medicine

## 2018-09-07 ENCOUNTER — Other Ambulatory Visit (INDEPENDENT_AMBULATORY_CARE_PROVIDER_SITE_OTHER): Payer: Medicare HMO

## 2018-09-07 ENCOUNTER — Ambulatory Visit (INDEPENDENT_AMBULATORY_CARE_PROVIDER_SITE_OTHER): Payer: Medicare HMO | Admitting: *Deleted

## 2018-09-07 ENCOUNTER — Encounter: Payer: Self-pay | Admitting: Internal Medicine

## 2018-09-07 VITALS — BP 142/92 | HR 90 | Ht 63.0 in | Wt 159.0 lb

## 2018-09-07 VITALS — BP 142/92 | HR 90 | Temp 98.5°F | Resp 16 | Ht 63.5 in | Wt 159.8 lb

## 2018-09-07 DIAGNOSIS — R52 Pain, unspecified: Secondary | ICD-10-CM

## 2018-09-07 DIAGNOSIS — G4700 Insomnia, unspecified: Secondary | ICD-10-CM | POA: Diagnosis not present

## 2018-09-07 DIAGNOSIS — Z0000 Encounter for general adult medical examination without abnormal findings: Secondary | ICD-10-CM

## 2018-09-07 DIAGNOSIS — G43709 Chronic migraine without aura, not intractable, without status migrainosus: Secondary | ICD-10-CM

## 2018-09-07 DIAGNOSIS — I10 Essential (primary) hypertension: Secondary | ICD-10-CM

## 2018-09-07 DIAGNOSIS — IMO0002 Reserved for concepts with insufficient information to code with codable children: Secondary | ICD-10-CM

## 2018-09-07 DIAGNOSIS — R7303 Prediabetes: Secondary | ICD-10-CM

## 2018-09-07 LAB — COMPREHENSIVE METABOLIC PANEL
ALT: 21 U/L (ref 0–35)
AST: 20 U/L (ref 0–37)
Albumin: 4.3 g/dL (ref 3.5–5.2)
Alkaline Phosphatase: 76 U/L (ref 39–117)
BUN: 33 mg/dL — ABNORMAL HIGH (ref 6–23)
CALCIUM: 9.9 mg/dL (ref 8.4–10.5)
CHLORIDE: 104 meq/L (ref 96–112)
CO2: 30 meq/L (ref 19–32)
CREATININE: 1.23 mg/dL — AB (ref 0.40–1.20)
GFR: 46.16 mL/min — ABNORMAL LOW (ref >60.00)
GLUCOSE: 84 mg/dL (ref 70–99)
Potassium: 3.8 mEq/L (ref 3.5–5.1)
Sodium: 140 mEq/L (ref 135–145)
Total Bilirubin: 0.4 mg/dL (ref 0.2–1.2)
Total Protein: 7.8 g/dL (ref 6.0–8.3)

## 2018-09-07 LAB — HEMOGLOBIN A1C: HEMOGLOBIN A1C: 5.3 % (ref 4.6–6.5)

## 2018-09-07 MED ORDER — ZOLPIDEM TARTRATE 10 MG PO TABS: 10.0000 mg | ORAL_TABLET | Freq: Every evening | ORAL | 1 refills | Status: DC | PRN

## 2018-09-07 MED ORDER — CANDESARTAN CILEXETIL 16 MG PO TABS: 16.0000 mg | ORAL_TABLET | Freq: Every day | ORAL | 1 refills | Status: DC

## 2018-09-07 NOTE — Assessment & Plan Note (Signed)
Sleeps about 6 hours a night with the Ambien A wellness nurse did discuss with her ways of improving her sleep Continue Ambien at current dose-refilled today

## 2018-09-07 NOTE — Assessment & Plan Note (Signed)
Indication for chronic opioid: chronic migraines Medication and dose: hydrocodone-acetaminophen 7.5-325 mg  # pills per month: 15 Last UDS date: 03/05/18 Pain contract signed (Y/N): 03/05/18  Pain meds are controlling pain as much as possible- no evidence of aberrant use [place] controlled substance database checked Will continue current pain regimen Follow up in 3 months

## 2018-09-07 NOTE — Assessment & Plan Note (Signed)
Following with neurology-has tried everything possible for her migraines Takes Imitrex as needed When this does not work she will take the hydrocodone, which I am prescribing-is the only thing that helps Also trying muscle relaxers to help, but make her drowsy We will continue the hydrocodone-she only uses this when absolutely needed and tries to avoid it Follow-up in 3 months

## 2018-09-07 NOTE — Patient Instructions (Addendum)
Continue doing brain stimulating activities (puzzles, reading, adult coloring books, staying active) to keep memory sharp.   Continue to eat heart healthy diet (full of fruits, vegetables, whole grains, lean protein, water--limit salt, fat, and sugar intake) and increase physical activity as tolerated.   Michaela Rios , Thank you for taking time to come for your Medicare Wellness Visit. I appreciate your ongoing commitment to your health goals. Please review the following plan we discussed and let me know if I can assist you in the future.   These are the goals we discussed: Goals    . Patient Stated     I want to increase my physical activity by joining [employer].       This is a list of the screening recommended for you and due dates:  Health Maintenance  Topic Date Due  . Mammogram  08/10/2018  . Flu Shot  03/28/2019*  . Pneumonia vaccines (1 of 2 - PCV13) 09/08/2019*  . DEXA scan (bone density measurement)  01/28/2019  . Colon Cancer Screening  02/23/2023  . Tetanus Vaccine  06/22/2023  .  Hepatitis C: One time screening is recommended by Center for Disease Control  (CDC) for  adults born from [birth year] through 1966.   Completed  *Topic was postponed. The date shown is not the original due date.   Health Maintenance, Female Adopting a healthy lifestyle and getting preventive care can go a long way to promote health and wellness. Talk with your health care provider about what schedule of regular examinations is right for you. This is a good chance for you to check in with your provider about disease prevention and staying healthy. In between checkups, there are plenty of things you can do on your own. Experts have done a lot of research about which lifestyle changes and preventive measures are most likely to keep you healthy. Ask your health care provider for more information. Weight and diet Eat a healthy diet  Be sure to include plenty of vegetables, fruits, low-fat dairy  products, and lean protein.  Do not eat a lot of foods high in solid fats, added sugars, or salt.  Get regular exercise. This is one of the most important things you can do for your health. ? Most adults should exercise for at least 150 minutes each week. The exercise should increase your heart rate and make you sweat (moderate-intensity exercise). ? Most adults should also do strengthening exercises at least twice a week. This is in addition to the moderate-intensity exercise.  Maintain a healthy weight  Body mass index (BMI) is a measurement that can be used to identify possible weight problems. It estimates body fat based on height and weight. Your health care provider can help determine your BMI and help you achieve or maintain a healthy weight.  For females 18 years of age and older: ? A BMI below 18.5 is considered underweight. ? A BMI of 18.5 to 24.9 is normal. ? A BMI of 25 to 29.9 is considered overweight. ? A BMI of 30 and above is considered obese.  Watch levels of cholesterol and blood lipids  You should start having your blood tested for lipids and cholesterol at 68 years of age, then have this test every 5 years.  You may need to have your cholesterol levels checked more often if: ? Your lipid or cholesterol levels are high. ? You are older than 68 years of age. ? You are at high risk for heart disease.  Cancer screening Lung Cancer  Lung cancer screening is recommended for adults 42-25 years old who are at high risk for lung cancer because of a history of smoking.  A yearly low-dose CT scan of the lungs is recommended for people who: ? Currently smoke. ? Have quit within the past 15 years. ? Have at least a 30-pack-year history of smoking. A pack year is smoking an average of one pack of cigarettes a day for 1 year.  Yearly screening should continue until it has been 15 years since you quit.  Yearly screening should stop if you develop a health problem that would  prevent you from having lung cancer treatment.  Breast Cancer  Practice breast self-awareness. This means understanding how your breasts normally appear and feel.  It also means doing regular breast self-exams. Let your health care provider know about any changes, no matter how small.  If you are in your 20s or 30s, you should have a clinical breast exam (CBE) by a health care provider every 1-3 years as part of a regular health exam.  If you are 43 or older, have a CBE every year. Also consider having a breast X-ray (mammogram) every year.  If you have a family history of breast cancer, talk to your health care provider about genetic screening.  If you are at high risk for breast cancer, talk to your health care provider about having an MRI and a mammogram every year.  Breast cancer gene (BRCA) assessment is recommended for women who have family members with BRCA-related cancers. BRCA-related cancers include: ? Breast. ? Ovarian. ? Tubal. ? Peritoneal cancers.  Results of the assessment will determine the need for genetic counseling and BRCA1 and BRCA2 testing.  Cervical Cancer Your health care provider may recommend that you be screened regularly for cancer of the pelvic organs (ovaries, uterus, and vagina). This screening involves a pelvic examination, including checking for microscopic changes to the surface of your cervix (Pap test). You may be encouraged to have this screening done every 3 years, beginning at age 12.  For women ages 72-65, health care providers may recommend pelvic exams and Pap testing every 3 years, or they may recommend the Pap and pelvic exam, combined with testing for human papilloma virus (HPV), every 5 years. Some types of HPV increase your risk of cervical cancer. Testing for HPV may also be done on women of any age with unclear Pap test results.  Other health care providers may not recommend any screening for nonpregnant women who are considered low risk  for pelvic cancer and who do not have symptoms. Ask your health care provider if a screening pelvic exam is right for you.  If you have had past treatment for cervical cancer or a condition that could lead to cancer, you need Pap tests and screening for cancer for at least 20 years after your treatment. If Pap tests have been discontinued, your risk factors (such as having a new sexual partner) need to be reassessed to determine if screening should resume. Some women have medical problems that increase the chance of getting cervical cancer. In these cases, your health care provider may recommend more frequent screening and Pap tests.  Colorectal Cancer  This type of cancer can be detected and often prevented.  Routine colorectal cancer screening usually begins at 68 years of age and continues through 68 years of age.  Your health care provider may recommend screening at an earlier age if you have risk factors  for colon cancer.  Your health care provider may also recommend using home test kits to check for hidden blood in the stool.  A small camera at the end of a tube can be used to examine your colon directly (sigmoidoscopy or colonoscopy). This is done to check for the earliest forms of colorectal cancer.  Routine screening usually begins at age 32.  Direct examination of the colon should be repeated every 5-10 years through 68 years of age. However, you may need to be screened more often if early forms of precancerous polyps or small growths are found.  Skin Cancer  Check your skin from head to toe regularly.  Tell your health care provider about any new moles or changes in moles, especially if there is a change in a mole's shape or color.  Also tell your health care provider if you have a mole that is larger than the size of a pencil eraser.  Always use sunscreen. Apply sunscreen liberally and repeatedly throughout the day.  Protect yourself by wearing long sleeves, pants, a  wide-brimmed hat, and sunglasses whenever you are outside.  Heart disease, diabetes, and high blood pressure  High blood pressure causes heart disease and increases the risk of stroke. High blood pressure is more likely to develop in: ? People who have blood pressure in the high end of the normal range (130-139/85-89 mm Hg). ? People who are overweight or obese. ? People who are African American.  If you are 5-20 years of age, have your blood pressure checked every 3-5 years. If you are 69 years of age or older, have your blood pressure checked every year. You should have your blood pressure measured twice-once when you are at a hospital or clinic, and once when you are not at a hospital or clinic. Record the average of the two measurements. To check your blood pressure when you are not at a hospital or clinic, you can use: ? An automated blood pressure machine at a pharmacy. ? A home blood pressure monitor.  If you are between 15 years and 36 years old, ask your health care provider if you should take aspirin to prevent strokes.  Have regular diabetes screenings. This involves taking a blood sample to check your fasting blood sugar level. ? If you are at a normal weight and have a low risk for diabetes, have this test once every three years after 68 years of age. ? If you are overweight and have a high risk for diabetes, consider being tested at a younger age or more often. Preventing infection Hepatitis B  If you have a higher risk for hepatitis B, you should be screened for this virus. You are considered at high risk for hepatitis B if: ? You were born in a country where hepatitis B is common. Ask your health care provider which countries are considered high risk. ? Your parents were born in a high-risk country, and you have not been immunized against hepatitis B (hepatitis B vaccine). ? You have HIV or AIDS. ? You use needles to inject street drugs. ? You live with someone who has  hepatitis B. ? You have had sex with someone who has hepatitis B. ? You get hemodialysis treatment. ? You take certain medicines for conditions, including cancer, organ transplantation, and autoimmune conditions.  Hepatitis C  Blood testing is recommended for: ? Everyone born from [birth year] through 1966. ? Anyone with known risk factors for hepatitis C.  Sexually transmitted infections (STIs)  You should be screened for sexually transmitted infections (STIs) including gonorrhea and chlamydia if: ? You are sexually active and are younger than 67 years of age. ? You are older than 68 years of age and your health care provider tells you that you are at risk for this type of infection. ? Your sexual activity has changed since you were last screened and you are at an increased risk for chlamydia or gonorrhea. Ask your health care provider if you are at risk.  If you do not have HIV, but are at risk, it may be recommended that you take a prescription medicine daily to prevent HIV infection. This is called pre-exposure prophylaxis (PrEP). You are considered at risk if: ? You are sexually active and do not regularly use condoms or know the HIV status of your partner(s). ? You take drugs by injection. ? You are sexually active with a partner who has HIV.  Talk with your health care provider about whether you are at high risk of being infected with HIV. If you choose to begin PrEP, you should first be tested for HIV. You should then be tested every 3 months for as long as you are taking PrEP. Pregnancy  If you are premenopausal and you may become pregnant, ask your health care provider about preconception counseling.  If you may become pregnant, take 400 to 800 micrograms (mcg) of folic acid every day.  If you want to prevent pregnancy, talk to your health care provider about birth control (contraception). Osteoporosis and menopause  Osteoporosis is a disease in which the bones lose minerals and  strength with aging. This can result in serious bone fractures. Your risk for osteoporosis can be identified using a bone density scan.  If you are 45 years of age or older, or if you are at risk for osteoporosis and fractures, ask your health care provider if you should be screened.  Ask your health care provider whether you should take a calcium or vitamin D supplement to lower your risk for osteoporosis.  Menopause may have certain physical symptoms and risks.  Hormone replacement therapy may reduce some of these symptoms and risks. Talk to your health care provider about whether hormone replacement therapy is right for you. Follow these instructions at home:  Schedule regular health, dental, and eye exams.  Stay current with your immunizations.  Do not use any tobacco products including cigarettes, chewing tobacco, or electronic cigarettes.  If you are pregnant, do not drink alcohol.  If you are breastfeeding, limit how much and how often you drink alcohol.  Limit alcohol intake to no more than 1 drink per day for nonpregnant women. One drink equals 12 ounces of beer, 5 ounces of Lorayne Getchell, or 1 ounces of hard liquor.  Do not use street drugs.  Do not share needles.  Ask your health care provider for help if you need support or information about quitting drugs.  Tell your health care provider if you often feel depressed.  Tell your health care provider if you have ever been abused or do not feel safe at home. This information is not intended to replace advice given to you by your health care provider. Make sure you discuss any questions you have with your health care provider. Document Released: 05/30/2011 Document Revised: 04/21/2016 Document Reviewed: 08/18/2015 Elsevier Interactive Patient Education  [employer].

## 2018-09-07 NOTE — Patient Instructions (Addendum)
  Tests ordered today. Your results will be released to MyChart (or called to you) after review, usually within 72hours after test completion. If any changes need to be made, you will be notified at that same time.   Medications reviewed and updated.  Changes include :   none  Your prescription(s) have been submitted to your pharmacy. Please take as directed and contact our office if you believe you are having problem(s) with the medication(s).   Please followup in 3 months   

## 2018-09-07 NOTE — Assessment & Plan Note (Signed)
Blood pressure slightly better than last visit, but just above normal We will continue current regimen She plans on starting to exercise and will work on improving sleep CMP Follow-up in 3 months

## 2018-09-07 NOTE — Assessment & Plan Note (Signed)
Will check A1c Stressed low sugar/carbohydrate diet and regular exercise We will work on weight loss

## 2018-09-08 ENCOUNTER — Encounter: Payer: Self-pay | Admitting: Internal Medicine

## 2018-09-10 ENCOUNTER — Encounter: Payer: Self-pay | Admitting: Internal Medicine

## 2018-09-10 ENCOUNTER — Other Ambulatory Visit: Payer: Self-pay | Admitting: Internal Medicine

## 2018-09-10 MED ORDER — HYDROCODONE-ACETAMINOPHEN 7.5-325 MG PO TABS: 1.0000 | ORAL_TABLET | Freq: Three times a day (TID) | ORAL | 0 refills | Status: DC | PRN

## 2018-09-10 NOTE — Telephone Encounter (Signed)
[place] controlled substance database checked.  Ok to fill medication.  

## 2018-10-17 ENCOUNTER — Other Ambulatory Visit: Payer: Self-pay | Admitting: Internal Medicine

## 2018-10-18 ENCOUNTER — Encounter: Payer: Self-pay | Admitting: Internal Medicine

## 2018-10-18 MED ORDER — HYDROCODONE-ACETAMINOPHEN 7.5-325 MG PO TABS: 1.0000 | ORAL_TABLET | Freq: Three times a day (TID) | ORAL | 0 refills | Status: DC | PRN

## 2018-10-18 NOTE — Telephone Encounter (Signed)
Last refill was 09/10/18 Last OV was 09/07/18 Next OV is 12/12/18

## 2018-10-20 ENCOUNTER — Encounter: Payer: Self-pay | Admitting: Internal Medicine

## 2018-10-20 MED ORDER — ALPRAZOLAM 0.5 MG PO TABS: ORAL_TABLET | ORAL | 1 refills | Status: DC

## 2018-10-22 DIAGNOSIS — G43709 Chronic migraine without aura, not intractable, without status migrainosus: Secondary | ICD-10-CM | POA: Diagnosis not present

## 2018-10-22 DIAGNOSIS — R69 Illness, unspecified: Secondary | ICD-10-CM | POA: Diagnosis not present

## 2018-10-23 ENCOUNTER — Other Ambulatory Visit: Payer: Self-pay | Admitting: Internal Medicine

## 2018-10-23 MED ORDER — ALPRAZOLAM 0.5 MG PO TABS: ORAL_TABLET | ORAL | 1 refills | Status: DC

## 2018-10-23 NOTE — Telephone Encounter (Signed)
Can this be resent to [facility] listed on chart.  Last refill was 09/12/18

## 2018-10-23 NOTE — Telephone Encounter (Signed)
Copied from [place] [id number]. Topic: Quick Communication - Rx Refill/Question >> Oct 23, 2018  2:07 PM Carolyn Stare wrote: Medication ALPRAZolam Duanne Moron) 0.5 MG tablet    pt is out of this med   [facility] so it need to be sent to the below    Preferred [facility]    eter Agent: Please be advised that RX refills may take up to 3 business days. We ask that you follow-up with your pharmacy.

## 2018-10-24 ENCOUNTER — Other Ambulatory Visit: Payer: Self-pay

## 2018-10-24 ENCOUNTER — Encounter: Payer: Self-pay | Admitting: Internal Medicine

## 2018-10-24 MED ORDER — ALPRAZOLAM 0.5 MG PO TABS: ORAL_TABLET | ORAL | 1 refills | Status: DC

## 2018-10-29 DIAGNOSIS — Z0100 Encounter for examination of eyes and vision without abnormal findings: Secondary | ICD-10-CM | POA: Diagnosis not present

## 2018-10-29 DIAGNOSIS — H524 Presbyopia: Secondary | ICD-10-CM | POA: Diagnosis not present

## 2018-11-13 ENCOUNTER — Encounter: Payer: Self-pay | Admitting: Internal Medicine

## 2018-11-13 ENCOUNTER — Other Ambulatory Visit: Payer: Self-pay | Admitting: Internal Medicine

## 2018-11-13 NOTE — Telephone Encounter (Signed)
[place] Controlled Substance Database checked. Last filled on 10/16/18  Last OV 09/07/18

## 2018-11-13 NOTE — Telephone Encounter (Signed)
Sent earlier today

## 2018-11-24 ENCOUNTER — Other Ambulatory Visit: Payer: Self-pay | Admitting: Internal Medicine

## 2018-11-26 ENCOUNTER — Encounter: Payer: Self-pay | Admitting: Internal Medicine

## 2018-11-26 MED ORDER — HYDROCODONE-ACETAMINOPHEN 7.5-325 MG PO TABS: 1.0000 | ORAL_TABLET | Freq: Three times a day (TID) | ORAL | 0 refills | Status: DC | PRN

## 2018-11-26 NOTE — Telephone Encounter (Signed)
Last refill was 10/18/18 Last OV was 09/07/18 Next OV 12/12/18

## 2018-11-27 NOTE — Telephone Encounter (Signed)
Check [place] registry last filled 10/18/2018.Marland KitchenJohny Chess

## 2018-11-28 MED ORDER — HYDROCODONE-ACETAMINOPHEN 7.5-325 MG PO TABS: 1.0000 | ORAL_TABLET | Freq: Three times a day (TID) | ORAL | 0 refills | Status: DC | PRN

## 2018-12-10 ENCOUNTER — Other Ambulatory Visit: Payer: Self-pay | Admitting: Internal Medicine

## 2018-12-10 NOTE — Telephone Encounter (Signed)
Last OV 09/07/18 Next OV 12/12/18 Last RF 11/14/18

## 2018-12-11 NOTE — Progress Notes (Deleted)
Subjective:    Patient ID: Michaela Rios, female    DOB: 07-11-1951, 68 y.o.   MRN: [id number]  HPI The patient is here for follow up for chronic pain management.  Indication for chronic opioid: chronic migraines Medication and dose: vicodin 7.5 mg - 325 mg prn # pills per month: 15 Last UDS date: 03/05/18 Pain contract signed (Y/N): y, 03/05/18 Date narcotic database last reviewed (include red flags): ***  Pain assessment:  Pain intensity: 10 /10  When has migraines Amount of pain relief with medication:  Significant  Use of pain medications:   Uses about 2 pills a month Side effects:   none Sleep:    Taking ambien Mood:    Has anxiety and depression - taking effexor and xanax Functional/social activities: continues to be active in home/work setting   Last took prescribed pain medication:  Alcohol use:   rare Tobacco use:    no Street Drug use:   no     Medications and allergies reviewed with patient and updated if appropriate.  Patient Active Problem List   Diagnosis Date Noted  . Left lumbar radiculopathy 06/04/2018  . Decreased creatinine clearance 06/04/2018  . Pain management contract signed 06/03/2018  . Encounter for pain management 03/05/2018  . Memory difficulties 03/05/2018  . Hormone replacement therapy (HRT) 12/04/2017  . Sialadenitis, right parotid 06/24/2017  . Insomnia 05/12/2017  . Prediabetes 11/08/2016  . Vitamin D deficiency 06/08/2009  . Depression 01/07/2009  . PEPTIC ULCER DISEASE, CHRONIC 01/07/2009  . OTHER OBSTRUCTION OF DUODENUM 01/07/2009  . HIATAL HERNIA 01/07/2009  . Irritable bowel syndrome 01/07/2009  . DEGENERATIVE JOINT DISEASE 01/07/2009  . ANEMIA, HX OF 01/07/2009  . PULMONARY NODULE 06/09/2008  . Anxiety 04/01/2008  . Essential hypertension 04/01/2008  . Allergic rhinitis 04/01/2008  . GERD 04/01/2008  . LOW BACK PAIN SYNDROME 04/01/2008  . Chronic migraine 04/01/2008    Current Outpatient Medications on File Prior  to Visit  Medication Sig Dispense Refill  . ALPRAZolam (XANAX) 0.5 MG tablet TAKE 1 TABLET 3 TIMES A DAY AS NEEDED FOR ANXIETY 90 tablet 1  . calcium-vitamin D (OSCAL 500/200 D-3) 500-200 MG-UNIT per tablet Take 1 tablet by mouth twice daily     . candesartan (ATACAND) 16 MG tablet Take 1 tablet (16 mg total) by mouth daily. 90 tablet 1  . clidinium-chlordiazePOXIDE (LIBRAX) 5-2.5 MG per capsule Take 1 capsule by mouth 2 (two) times daily as needed. 180 capsule 0  . cyclobenzaprine (FLEXERIL) 10 MG tablet     . furosemide (LASIX) 20 MG tablet Take 1 tablet (20 mg total) by mouth daily. 90 tablet 1  . gabapentin (NEURONTIN) 100 MG capsule Take 3 capsules (300 mg total) by mouth at bedtime. 270 capsule 0  . HYDROcodone-acetaminophen (NORCO) 7.5-325 MG tablet Take 1 tablet by mouth 3 (three) times daily as needed for severe pain (headaches). 15 tablet 0  . hyoscyamine (LEVSIN, ANASPAZ) 0.125 MG tablet DISSOLVE 1 TABLET UNDER TONGUE EVERY 6 HOURS AS NEEDED 30 tablet 1  . meclizine (ANTIVERT) 25 MG tablet Take 1 tablet by mouth every 6 hours as needed for dizziness 90 tablet 3  . metoprolol succinate (TOPROL XL) 100 MG 24 hr tablet Take 1 tablet (100 mg total) by mouth daily. Take with or immediately following a meal. 90 tablet 3  . omeprazole (PRILOSEC) 20 MG capsule Take 1 capsule (20 mg total) by mouth daily. 90 capsule 3  . SUMAtriptan (IMITREX) 100 MG tablet Take  1 tablet by mouth  every 2 hours as needed for migraine. Max 2 tablets in  24 hours.    . SUMAtriptan (IMITREX) 20 MG/ACT nasal spray Place 1 spray into the nose.    Marland Kitchen tiZANidine (ZANAFLEX) 4 MG tablet     . venlafaxine XR (EFFEXOR-XR) 75 MG 24 hr capsule Take 225 mg by mouth.    Marland Kitchen VIVELLE-DOT 0.025 MG/24HR APPLY 1 PATCH TWICE WEEKLY  ONTO THE SKIN 24 patch 1  . zolpidem (AMBIEN) 10 MG tablet TAKE 1 TABLET BY MOUTH AT BEDTIME AS NEEDED FOR SLEEP 30 tablet 1   No current facility-administered medications on file prior to visit.      Past Medical History:  Diagnosis Date  . Anemia   . Anxiety   . Arthritis   . Cataract   . Chronic migraine    follows with neuro for same  . Clotting disorder (HCC)    clot in ovarian vein- hx  . Esophagitis   . GERD (gastroesophageal reflux disease)   . Hypertension   . IBS (irritable bowel syndrome)   . Low back pain syndrome   . MRSA (methicillin resistant Staphylococcus aureus) infection 04/17/12   left torso  . Peptic ulcer disease 2003, 12/2012  . Peripheral vascular disease ([place])   . Vitamin D deficiency     Past Surgical History:  Procedure Laterality Date  . ABDOMINAL HYSTERECTOMY    . LAPAROSCOPIC BILATERAL SALPINGO OOPHERECTOMY    . pud surgery w/duod sticture-plasty and vagotomy  2003   Dr. Marlou Starks  . TONSILLECTOMY AND ADENOIDECTOMY     as a child  . TUBAL LIGATION  1984    Social History   Socioeconomic History  . Marital status: Married    Spouse name: Levada Dy  . Number of children: 1  . Years of education: Not on file  . Highest education level: Not on file  Occupational History  . Occupation: retired  Scientific laboratory technician  . Financial resource strain: Not hard at all  . Food insecurity:    Worry: Never true    Inability: Never true  . Transportation needs:    Medical: No    Non-medical: No  Tobacco Use  . Smoking status: Never Smoker  . Smokeless tobacco: Never Used  Substance and Sexual Activity  . Alcohol use: Yes    Comment: rare  . Drug use: No  . Sexual activity: Yes  Lifestyle  . Physical activity:    Days per week: 0 days    Minutes per session: 0 min  . Stress: To some extent  Relationships  . Social connections:    Talks on phone: More than three times a week    Gets together: More than three times a week    Attends religious service: 1 to 4 times per year    Active member of club or organization: Yes    Attends meetings of clubs or organizations: 1 to 4 times per year    Relationship status: Married  Other Topics Concern  .  Not on file  Social History Narrative   Lives with spouse    Grown son in [place], 2 g kids    Family History  Problem Relation Age of Onset  . Aneurysm Father   . Prostate cancer Brother   . Lung cancer Brother   . Colon cancer Neg Hx   . Esophageal cancer Neg Hx   . Rectal cancer Neg Hx   . Stomach cancer Neg Hx  Review of Systems     Objective:  There were no vitals filed for this visit. BP Readings from Last 3 Encounters:  09/07/18 (!) 142/92  09/07/18 (!) 142/92  06/04/18 (!) 156/100   Wt Readings from Last 3 Encounters:  09/07/18 159 lb (72.1 kg)  09/07/18 159 lb 12.8 oz (72.5 kg)  06/04/18 153 lb (69.4 kg)   There is no height or weight on file to calculate BMI.   Physical Exam    Constitutional: Appears well-developed and well-nourished. No distress.  HENT:  Head: Normocephalic and atraumatic.  Neck: Neck supple. No tracheal deviation present. No thyromegaly present.  No cervical lymphadenopathy Cardiovascular: Normal rate, regular rhythm and normal heart sounds.   No murmur heard. No carotid bruit .  No edema Pulmonary/Chest: Effort normal and breath sounds normal. No respiratory distress. No has no wheezes. No rales.  Skin: Skin is warm and dry. Not diaphoretic.  Psychiatric: Normal mood and affect. Behavior is normal.      Assessment & Plan:    See Problem List for Assessment and Plan of chronic medical problems.

## 2018-12-12 ENCOUNTER — Ambulatory Visit: Payer: Medicare HMO | Admitting: Internal Medicine

## 2018-12-21 ENCOUNTER — Encounter: Payer: Self-pay | Admitting: Internal Medicine

## 2018-12-22 MED ORDER — ALPRAZOLAM 0.5 MG PO TABS: ORAL_TABLET | ORAL | 1 refills | Status: DC

## 2018-12-25 NOTE — Patient Instructions (Addendum)
     Medications reviewed and updated.  Changes include :   none    Please followup in 3 months   

## 2018-12-25 NOTE — Progress Notes (Signed)
Subjective:    Patient ID: Michaela Rios, female    DOB: 10-11-1951, 68 y.o.   MRN: [id number]  HPI The patient is here for follow up for chronic pain management.  Indication for chronic opioid: chronic migraines Medication and dose: vicodin 7.5-325 mg prn # pills per month: 15 Last UDS date: 03/05/18 Pain contract signed (Y/N): 03/05/18 Date narcotic database last reviewed (include red flags): 12/26/18  Pain assessment:  Pain intensity: when she has migraine 10 /10  Amount of pain relief with medication: relief of pain Use of pain medications:  2 pills / month - only with migraines Side effects:   none Sleep: fair with ambien Mood:   Anxiety, depression - takes effexor, xanax-still with depression and anxiety Functional/social activities: continues to be active, but prefers to be alone  Last took prescribed pain medication: 2 days ago Alcohol use:  rare Tobacco use:    no Street Drug use:  no  Her migraines have increased in intensity and frequency.  She often feels she is getting a headache most afternoons.  She tries imitrex first and if that does not work she will takes either another imitrex or a muscle relaxer.  If that does not help she takes the norco.  She is following with neurology.  She is tried so many different things in the past she is not 1 ideally keep trying new medications.  She has discussed with neurology considering Topamax again and we will follow-up with him.   Medications and allergies reviewed with patient and updated if appropriate.  Patient Active Problem List   Diagnosis Date Noted  . Left lumbar radiculopathy 06/04/2018  . Decreased creatinine clearance 06/04/2018  . Pain management contract signed 06/03/2018  . Encounter for pain management 03/05/2018  . Memory difficulties 03/05/2018  . Hormone replacement therapy (HRT) 12/04/2017  . Sialadenitis, right parotid 06/24/2017  . Insomnia 05/12/2017  . Prediabetes 11/08/2016  . Vitamin D  deficiency 06/08/2009  . Depression 01/07/2009  . PEPTIC ULCER DISEASE, CHRONIC 01/07/2009  . OTHER OBSTRUCTION OF DUODENUM 01/07/2009  . HIATAL HERNIA 01/07/2009  . Irritable bowel syndrome 01/07/2009  . DEGENERATIVE JOINT DISEASE 01/07/2009  . ANEMIA, HX OF 01/07/2009  . PULMONARY NODULE 06/09/2008  . Anxiety 04/01/2008  . Essential hypertension 04/01/2008  . Allergic rhinitis 04/01/2008  . GERD 04/01/2008  . LOW BACK PAIN SYNDROME 04/01/2008  . Chronic migraine 04/01/2008    Current Outpatient Medications on File Prior to Visit  Medication Sig Dispense Refill  . ALPRAZolam (XANAX) 0.5 MG tablet TAKE 1 TABLET 3 TIMES A DAY AS NEEDED FOR ANXIETY 90 tablet 1  . calcium-vitamin D (OSCAL 500/200 D-3) 500-200 MG-UNIT per tablet Take 1 tablet by mouth twice daily     . candesartan (ATACAND) 16 MG tablet Take 1 tablet (16 mg total) by mouth daily. 90 tablet 1  . clidinium-chlordiazePOXIDE (LIBRAX) 5-2.5 MG per capsule Take 1 capsule by mouth 2 (two) times daily as needed. 180 capsule 0  . cyclobenzaprine (FLEXERIL) 10 MG tablet     . furosemide (LASIX) 20 MG tablet Take 1 tablet (20 mg total) by mouth daily. 90 tablet 1  . gabapentin (NEURONTIN) 100 MG capsule Take 3 capsules (300 mg total) by mouth at bedtime. 270 capsule 0  . HYDROcodone-acetaminophen (NORCO) 7.5-325 MG tablet Take 1 tablet by mouth 3 (three) times daily as needed for severe pain (headaches). 15 tablet 0  . hyoscyamine (LEVSIN, ANASPAZ) 0.125 MG tablet DISSOLVE 1 TABLET UNDER TONGUE  EVERY 6 HOURS AS NEEDED 30 tablet 1  . meclizine (ANTIVERT) 25 MG tablet Take 1 tablet by mouth every 6 hours as needed for dizziness 90 tablet 3  . metoprolol succinate (TOPROL XL) 100 MG 24 hr tablet Take 1 tablet (100 mg total) by mouth daily. Take with or immediately following a meal. 90 tablet 3  . omeprazole (PRILOSEC) 20 MG capsule Take 1 capsule (20 mg total) by mouth daily. 90 capsule 3  . tiZANidine (ZANAFLEX) 4 MG tablet     .  zolpidem (AMBIEN) 10 MG tablet TAKE 1 TABLET BY MOUTH AT BEDTIME AS NEEDED FOR SLEEP 30 tablet 1  . SUMAtriptan (IMITREX) 100 MG tablet Take 1 tablet by mouth  every 2 hours as needed for migraine. Max 2 tablets in  24 hours.    . SUMAtriptan (IMITREX) 20 MG/ACT nasal spray Place 1 spray into the nose.    . venlafaxine XR (EFFEXOR-XR) 75 MG 24 hr capsule Take 225 mg by mouth.     No current facility-administered medications on file prior to visit.     Past Medical History:  Diagnosis Date  . Anemia   . Anxiety   . Arthritis   . Cataract   . Chronic migraine    follows with neuro for same  . Clotting disorder (HCC)    clot in ovarian vein- hx  . Esophagitis   . GERD (gastroesophageal reflux disease)   . Hypertension   . IBS (irritable bowel syndrome)   . Low back pain syndrome   . MRSA (methicillin resistant Staphylococcus aureus) infection 04/17/12   left torso  . Peptic ulcer disease 2003, 12/2012  . Peripheral vascular disease ([place])   . Vitamin D deficiency     Past Surgical History:  Procedure Laterality Date  . ABDOMINAL HYSTERECTOMY    . LAPAROSCOPIC BILATERAL SALPINGO OOPHERECTOMY    . pud surgery w/duod sticture-plasty and vagotomy  2003   Dr. Marlou Starks  . TONSILLECTOMY AND ADENOIDECTOMY     as a child  . TUBAL LIGATION  1984    Social History   Socioeconomic History  . Marital status: Married    Spouse name: Levada Dy  . Number of children: 1  . Years of education: Not on file  . Highest education level: Not on file  Occupational History  . Occupation: retired  Scientific laboratory technician  . Financial resource strain: Not hard at all  . Food insecurity:    Worry: Never true    Inability: Never true  . Transportation needs:    Medical: No    Non-medical: No  Tobacco Use  . Smoking status: Never Smoker  . Smokeless tobacco: Never Used  Substance and Sexual Activity  . Alcohol use: Yes    Comment: rare  . Drug use: No  . Sexual activity: Yes  Lifestyle  . Physical  activity:    Days per week: 0 days    Minutes per session: 0 min  . Stress: To some extent  Relationships  . Social connections:    Talks on phone: More than three times a week    Gets together: More than three times a week    Attends religious service: 1 to 4 times per year    Active member of club or organization: Yes    Attends meetings of clubs or organizations: 1 to 4 times per year    Relationship status: Married  Other Topics Concern  . Not on file  Social History Narrative  Lives with spouse    Grown son in [place], 2 g kids    Family History  Problem Relation Age of Onset  . Aneurysm Father   . Prostate cancer Brother   . Lung cancer Brother   . Colon cancer Neg Hx   . Esophageal cancer Neg Hx   . Rectal cancer Neg Hx   . Stomach cancer Neg Hx     Review of Systems     Objective:   Vitals:   12/26/18 1415  BP: (!) 138/92  Pulse: 90  Resp: 16  Temp: 98.1 F (36.7 C)  SpO2: 92%   BP Readings from Last 3 Encounters:  12/26/18 (!) 138/92  09/07/18 (!) 142/92  09/07/18 (!) 142/92   Wt Readings from Last 3 Encounters:  12/26/18 160 lb (72.6 kg)  09/07/18 159 lb (72.1 kg)  09/07/18 159 lb 12.8 oz (72.5 kg)   Body mass index is 28.34 kg/m.   Physical Exam    Constitutional: Appears well-developed and well-nourished. No distress.  Psychiatric: Normal mood and affect. Behavior is normal.      Assessment & Plan:    15 minutes were spent face-to-face with the patient, over 50% of which was spent counseling regarding management of her migraines, including different medication options and the importance of following up with neurology.    See Problem List for Assessment and Plan of chronic medical problems.

## 2018-12-26 ENCOUNTER — Encounter: Payer: Self-pay | Admitting: Internal Medicine

## 2018-12-26 ENCOUNTER — Ambulatory Visit (INDEPENDENT_AMBULATORY_CARE_PROVIDER_SITE_OTHER): Payer: Medicare HMO | Admitting: Internal Medicine

## 2018-12-26 VITALS — BP 138/92 | HR 90 | Temp 98.1°F | Resp 16 | Ht 63.0 in | Wt 160.0 lb

## 2018-12-26 DIAGNOSIS — IMO0002 Reserved for concepts with insufficient information to code with codable children: Secondary | ICD-10-CM

## 2018-12-26 DIAGNOSIS — R52 Pain, unspecified: Secondary | ICD-10-CM | POA: Diagnosis not present

## 2018-12-26 DIAGNOSIS — G43709 Chronic migraine without aura, not intractable, without status migrainosus: Secondary | ICD-10-CM

## 2018-12-26 MED ORDER — HYDROCODONE-ACETAMINOPHEN 7.5-325 MG PO TABS: 1.0000 | ORAL_TABLET | Freq: Three times a day (TID) | ORAL | 0 refills | Status: DC | PRN

## 2018-12-26 MED ORDER — FUROSEMIDE 20 MG PO TABS: 20.0000 mg | ORAL_TABLET | Freq: Every day | ORAL | 1 refills | Status: DC

## 2018-12-26 NOTE — Assessment & Plan Note (Addendum)
Indication for chronic opioid: chronic migraines Medication and dose: vicodin 7.5-325 mg prn # pills per month: 15 Last UDS date: 03/05/18 Pain contract signed (Y/N): 03/05/18 Date narcotic database last reviewed (include red flags): 12/26/18  Following with neurology -- migraines not ideally controlled - does not want to try anything new -may try Topamax again management per neuro Will continue to prescribe norco - she does not take often and is taking medication appropriately F/u in 3 months

## 2018-12-26 NOTE — Assessment & Plan Note (Addendum)
Has frequent migraines  - migraines have increased in intensity and frequency Following with neurology Takes imitrex prn, then repeats imitrex or takes a muscle relaxer Then norco if needed - takes them about two times a month Will continue norco prn Overall her migraines are not ideally controlled and this is affecting her quality of life including anxiety and depression Encouraged her to consider trying other options since this is the only thing that may improve her migraine control We will consider adding Topamax-we will follow-up with neurology Will refill Norco

## 2019-01-21 ENCOUNTER — Other Ambulatory Visit: Payer: Self-pay | Admitting: Internal Medicine

## 2019-01-22 NOTE — Telephone Encounter (Signed)
Check [place] registry last filled 12/22/2018.Marland KitchenJohny Chess

## 2019-02-04 ENCOUNTER — Other Ambulatory Visit: Payer: Self-pay | Admitting: Internal Medicine

## 2019-02-04 MED ORDER — HYDROCODONE-ACETAMINOPHEN 7.5-325 MG PO TABS: 1.0000 | ORAL_TABLET | Freq: Three times a day (TID) | ORAL | 0 refills | Status: DC | PRN

## 2019-02-04 NOTE — Telephone Encounter (Signed)
Check [place] registry last filled 12/26/2018.Marland KitchenJohny Chess

## 2019-02-12 ENCOUNTER — Other Ambulatory Visit: Payer: Self-pay | Admitting: Internal Medicine

## 2019-02-12 NOTE — Telephone Encounter (Signed)
Last OV 12/26/18 Next OV 03/27/19 Last RF 01/09/19

## 2019-02-20 ENCOUNTER — Other Ambulatory Visit: Payer: Self-pay | Admitting: Internal Medicine

## 2019-03-07 ENCOUNTER — Other Ambulatory Visit: Payer: Self-pay | Admitting: Internal Medicine

## 2019-03-07 NOTE — Telephone Encounter (Signed)
[place] Controlled Database Checked Last filled: 02/04/19 # 15 LOV w/you: 12/26/18 Next appt w/you: 03/27/19

## 2019-03-08 MED ORDER — HYDROCODONE-ACETAMINOPHEN 7.5-325 MG PO TABS: 1.0000 | ORAL_TABLET | Freq: Three times a day (TID) | ORAL | 0 refills | Status: DC | PRN

## 2019-03-18 ENCOUNTER — Encounter: Payer: Self-pay | Admitting: Internal Medicine

## 2019-03-21 ENCOUNTER — Telehealth: Payer: Self-pay | Admitting: Internal Medicine

## 2019-03-21 NOTE — Telephone Encounter (Signed)
ALPRAZolam (XANAX) 0.5 MG tablet  Patient is requesting a refill on this medication. She states she will be out on 4/25. [facility]

## 2019-03-22 ENCOUNTER — Telehealth: Payer: Self-pay | Admitting: Internal Medicine

## 2019-03-22 MED ORDER — ALPRAZOLAM 0.5 MG PO TABS: ORAL_TABLET | ORAL | 1 refills | Status: DC

## 2019-03-22 NOTE — Telephone Encounter (Signed)
Gave response below to pharmacy.

## 2019-03-22 NOTE — Telephone Encounter (Signed)
Copied from [place] [id number]. Topic: Quick Communication - See Telephone Encounter >> Mar 22, 2019  1:09 PM Blase Mess A wrote: CRM for notification. See Telephone encounter for: 03/22/19. Michaela Rios is calling from [place] is calling to see if it is ok for the paient to take ALPRAZolam Duanne Moron) 0.5 MG tablet [[id number]] and HYDROcodone-acetaminophen ([place]) 7.5-325 MG tablet [[id number]] [place] [address], [place] - [address] [phone] (Phone) [phone] (Fax)  Please advise thank you

## 2019-03-22 NOTE — Telephone Encounter (Signed)
Last refill was 02/20/19 Last OV 12/26/18 Next OV 03/27/19

## 2019-03-22 NOTE — Telephone Encounter (Signed)
Yes she takes hydrocodone as needed - not daily - typically uses 15 pills/month

## 2019-03-26 NOTE — Progress Notes (Deleted)
Virtual Visit via Video Note  I connected with Michaela Rios on 03/26/19 at  2:00 PM EDT by a video enabled telemedicine application and verified that I am speaking with the correct person using two identifiers.   I discussed the limitations of evaluation and management by telemedicine and the availability of in person appointments. The patient expressed understanding and agreed to proceed.  The patient is currently at home and I am in the office.    No referring provider.    History of Present Illness: The patient is here for follow up for chronic pain management.  Indication for chronic opioid: chronic migraines Medication and dose: vicodin 7.5-325 mg prn  # pills per month: 15 Last UDS date: 03/05/18 - due Pain contract signed (Y/N): 03/05/2018 Date narcotic database last reviewed (include red flags):   Pain assessment:  Pain intensity: /10  Amount of pain relief with medication:  Use of pain medications:  Side effects:  Sleep: takes [place] Mood:  Functional/social activities: continues to be active in home/work setting   Last took prescribed pain medication:  Alcohol use:  rare Tobacco use:   no Street Drug use:   no    Social History   Socioeconomic History  . Marital status: Married    Spouse name: Levada Dy  . Number of children: 1  . Years of education: Not on file  . Highest education level: Not on file  Occupational History  . Occupation: retired  Scientific laboratory technician  . Financial resource strain: Not hard at all  . Food insecurity:    Worry: Never true    Inability: Never true  . Transportation needs:    Medical: No    Non-medical: No  Tobacco Use  . Smoking status: Never Smoker  . Smokeless tobacco: Never Used  Substance and Sexual Activity  . Alcohol use: Yes    Comment: rare  . Drug use: No  . Sexual activity: Yes  Lifestyle  . Physical activity:    Days per week: 0 days    Minutes per session: 0 min  . Stress: To some extent  Relationships  .  Social connections:    Talks on phone: More than three times a week    Gets together: More than three times a week    Attends religious service: 1 to 4 times per year    Active member of club or organization: Yes    Attends meetings of clubs or organizations: 1 to 4 times per year    Relationship status: Married  Other Topics Concern  . Not on file  Social History Narrative   Lives with spouse    Grown son in [place], 2 g kids     Observations/Objective: Appears well in NAD   Assessment and Plan:  See Problem List for Assessment and Plan of chronic medical problems.   Follow Up Instructions:    I discussed the assessment and treatment plan with the patient. The patient was provided an opportunity to ask questions and all were answered. The patient agreed with the plan and demonstrated an understanding of the instructions.   The patient was advised to call back or seek an in-person evaluation if the symptoms worsen or if the condition fails to improve as anticipated.    Binnie Rail, MD

## 2019-03-27 ENCOUNTER — Encounter: Payer: Self-pay | Admitting: Internal Medicine

## 2019-03-27 ENCOUNTER — Ambulatory Visit: Payer: Medicare HMO | Admitting: Internal Medicine

## 2019-03-27 ENCOUNTER — Other Ambulatory Visit: Payer: Self-pay

## 2019-03-28 NOTE — Progress Notes (Signed)
Virtual Visit via Video Note  I connected with Michaela Rios on 03/29/19 at  1:00 PM EDT by a video enabled telemedicine application and verified that I am speaking with the correct person using two identifiers.   I discussed the limitations of evaluation and management by telemedicine and the availability of in person appointments. The patient expressed understanding and agreed to proceed.  The patient is currently at home and I am in the office.    No referring provider.    History of Present Illness: The patient is here for follow up for chronic pain management.  Indication for chronic opioid: chronic migraines Medication and dose: vicodin 7.5-325 mg prn  # pills per month: 15 Last UDS date: 03/05/18 - due Pain contract signed (Y/N): 03/05/2018 - due Date narcotic database last reviewed (include red flags): 03/29/19  Her migraines are bad.  She is having at least 4 bad ones a week.  They have gotten worse since being cooped up at home with the coronavirus situation.  Her stress level is higher  Pain assessment:  Pain intensity: 10/10  Amount of pain relief with medication: very helpful  - takes it if muscle relaxer and imitrex does not work Use of pain medications:  Takes them only for migraines - 15 pills last 6 weeks or more Side effects: none Sleep: takes [place] Mood:  "iffy" - anxious, bad mood because she is not feeling well.  Functional/social activities: continues to be somewhat active in home setting   Last took prescribed pain medication: last week Alcohol use:  rare Tobacco use:   no Street Drug use:   no    Social History   Socioeconomic History  . Marital status: Married    Spouse name: Levada Dy  . Number of children: 1  . Years of education: Not on file  . Highest education level: Not on file  Occupational History  . Occupation: retired  Scientific laboratory technician  . Financial resource strain: Not hard at all  . Food insecurity:    Worry: Never true    Inability:  Never true  . Transportation needs:    Medical: No    Non-medical: No  Tobacco Use  . Smoking status: Never Smoker  . Smokeless tobacco: Never Used  Substance and Sexual Activity  . Alcohol use: Yes    Comment: rare  . Drug use: No  . Sexual activity: Yes  Lifestyle  . Physical activity:    Days per week: 0 days    Minutes per session: 0 min  . Stress: To some extent  Relationships  . Social connections:    Talks on phone: More than three times a week    Gets together: More than three times a week    Attends religious service: 1 to 4 times per year    Active member of club or organization: Yes    Attends meetings of clubs or organizations: 1 to 4 times per year    Relationship status: Married  Other Topics Concern  . Not on file  Social History Narrative   Lives with spouse    Grown son in [place], 2 g kids     Observations/Objective: Appears well in NAD  BP well controlled at home  Assessment and Plan:  See Problem List for Assessment and Plan of chronic medical problems.   Follow Up Instructions:    I discussed the assessment and treatment plan with the patient. The patient was provided an opportunity to ask questions and  all were answered. The patient agreed with the plan and demonstrated an understanding of the instructions.   The patient was advised to call back or seek an in-person evaluation if the symptoms worsen or if the condition fails to improve as anticipated.  Follow-up in 3 months in the office for chronic pain management and routine follow-up of chronic medical problems  Binnie Rail, MD

## 2019-03-29 ENCOUNTER — Ambulatory Visit (INDEPENDENT_AMBULATORY_CARE_PROVIDER_SITE_OTHER): Payer: Medicare HMO | Admitting: Internal Medicine

## 2019-03-29 ENCOUNTER — Encounter: Payer: Self-pay | Admitting: Internal Medicine

## 2019-03-29 DIAGNOSIS — G43709 Chronic migraine without aura, not intractable, without status migrainosus: Secondary | ICD-10-CM | POA: Diagnosis not present

## 2019-03-29 DIAGNOSIS — R52 Pain, unspecified: Secondary | ICD-10-CM

## 2019-03-29 DIAGNOSIS — IMO0002 Reserved for concepts with insufficient information to code with codable children: Secondary | ICD-10-CM

## 2019-03-29 NOTE — Assessment & Plan Note (Signed)
Indication for chronic opioid: chronic migraines Medication and dose: vicodin 7.5-325 mg prn  # pills per month: 15 Last UDS date: 03/05/18 - due Pain contract signed (Y/N): 03/05/2018 - due Date narcotic database last reviewed: 03/29/19  Taking medication as prescribed - no aberrant behavior Will continue vicodin as needed for severe migraines

## 2019-03-29 NOTE — Assessment & Plan Note (Signed)
Following with neurology Takes imitrex, flexeril as needed -- if not effective will take vicodin Has tried several things in the past for her migraines nothing has been that effective, but regimen now works for the most part We will continue

## 2019-04-16 ENCOUNTER — Other Ambulatory Visit: Payer: Self-pay | Admitting: Internal Medicine

## 2019-04-17 ENCOUNTER — Encounter: Payer: Self-pay | Admitting: Internal Medicine

## 2019-04-17 MED ORDER — ZOLPIDEM TARTRATE 10 MG PO TABS: ORAL_TABLET | ORAL | 1 refills | Status: DC

## 2019-04-17 NOTE — Telephone Encounter (Signed)
Check [place] registry last filled 03/26/2019.Marland KitchenJohny Rios

## 2019-04-17 NOTE — Addendum Note (Signed)
Addended by: Binnie Rail on: 04/17/2019 11:46 AM   Modules accepted: Orders

## 2019-04-18 ENCOUNTER — Telehealth: Payer: Self-pay | Admitting: Internal Medicine

## 2019-04-18 NOTE — Telephone Encounter (Signed)
-----   Message from Binnie Rail, MD sent at 03/29/2019  1:25 PM EDT ----- She needs a 31-month follow-up in office for chronic pain management (CPM) and routine follow-up

## 2019-04-18 NOTE — Telephone Encounter (Signed)
Left message for patient to call back to schedule appointment.

## 2019-04-23 DIAGNOSIS — R69 Illness, unspecified: Secondary | ICD-10-CM | POA: Diagnosis not present

## 2019-04-30 ENCOUNTER — Other Ambulatory Visit: Payer: Self-pay | Admitting: Internal Medicine

## 2019-04-30 DIAGNOSIS — G43709 Chronic migraine without aura, not intractable, without status migrainosus: Secondary | ICD-10-CM | POA: Diagnosis not present

## 2019-04-30 MED ORDER — HYDROCODONE-ACETAMINOPHEN 7.5-325 MG PO TABS: 1.0000 | ORAL_TABLET | Freq: Three times a day (TID) | ORAL | 0 refills | Status: DC | PRN

## 2019-04-30 NOTE — Telephone Encounter (Signed)
Last RF 03/08/19 Last OV 03/29/19 Next OV 07/01/19

## 2019-05-17 ENCOUNTER — Other Ambulatory Visit: Payer: Self-pay | Admitting: Internal Medicine

## 2019-05-17 ENCOUNTER — Encounter: Payer: Self-pay | Admitting: Internal Medicine

## 2019-05-17 NOTE — Telephone Encounter (Signed)
Patient called back.  States she did not want refill but she wanted to change the pharmacy on this medication to [facility].

## 2019-05-27 ENCOUNTER — Encounter: Payer: Self-pay | Admitting: Internal Medicine

## 2019-05-27 ENCOUNTER — Other Ambulatory Visit: Payer: Self-pay | Admitting: Internal Medicine

## 2019-05-28 NOTE — Telephone Encounter (Signed)
[place] Controlled Database Checked Last filled: 04/29/19 #90 LOV w/you: 03/29/19 Next appt w/you: 07/01/19

## 2019-06-03 ENCOUNTER — Other Ambulatory Visit: Payer: Self-pay | Admitting: Internal Medicine

## 2019-06-07 ENCOUNTER — Other Ambulatory Visit: Payer: Self-pay | Admitting: Internal Medicine

## 2019-06-07 MED ORDER — HYDROCODONE-ACETAMINOPHEN 7.5-325 MG PO TABS: 1.0000 | ORAL_TABLET | Freq: Three times a day (TID) | ORAL | 0 refills | Status: DC | PRN

## 2019-06-07 NOTE — Telephone Encounter (Signed)
Last RF 04/30/19 Last OV 03/29/19 Next OV 07/01/19

## 2019-06-17 ENCOUNTER — Other Ambulatory Visit: Payer: Self-pay | Admitting: Internal Medicine

## 2019-06-17 NOTE — Telephone Encounter (Signed)
Last OV 03/29/19 Next OV 07/01/19  [place] Controlled Substance Database checked. Last filled on 05/17/19

## 2019-07-01 ENCOUNTER — Ambulatory Visit: Payer: Medicare HMO | Admitting: Internal Medicine

## 2019-07-02 ENCOUNTER — Ambulatory Visit: Payer: Medicare HMO | Admitting: Internal Medicine

## 2019-07-03 NOTE — Patient Instructions (Addendum)
  Tests ordered today. Your results will be released to [place] (or called to you) after review.  If any changes need to be made, you will be notified at that same time.   Medications reviewed and updated.  Changes include :   For your allergies continue an anti-histamine, try singulair at bedtime, and a steroid nasal spray - nasonex  Your prescription(s) have been submitted to your pharmacy. Please take as directed and contact our office if you believe you are having problem(s) with the medication(s).   Please followup in 3 months

## 2019-07-03 NOTE — Assessment & Plan Note (Addendum)
Indication for chronic opioid use chronic migraines Using Vicodin 7.5 mg - 325 mg as needed-does not take on a daily basis.  She only takes 1 all the other medication she has been migraines or not effective She is taking medication appropriately.  [place] controlled database checked. Will continue current dose-she will only take as needed UDS ordered Follow-up in 3 months

## 2019-07-03 NOTE — Progress Notes (Signed)
Subjective:    Patient ID: Michaela Rios, female    DOB: 06-07-1951, 68 y.o.   MRN: [id number]  HPI The patient is here for follow up for chronic pain management.  Indication for chronic opioid: chronic migraines Medication and dose: vicodin 7.5-325 mg prn # pills per month: 15 Last UDS date: due - ordered today Pain contract signed (Y/N): 02/2018 Date narcotic database last reviewed: 07/04/19  Pain assessment:  Pain intensity:10 /10  Amount of pain relief with medication:  Very good relief Use of pain medications:  Takes pain medication only prn for migraines when nothing else works.  Has several medications from neurology that she takes prior to taking this Side effects:  none Sleep:    Taking ambien-fairly controlled Mood: anxious - fairly controlled Functional/social activities: tries to be active  Last took prescribed pain medication: 4 days ago Alcohol use:    No  Tobacco use:   no Street Drug use:    no   Anxiety: She is taking her medication daily as prescribed. She denies any side effects from the medication. She feels her anxiety is fairly controlled and she is happy with her current dose of medication.   Depression: She is taking her medication daily as prescribed. She denies any side effects from the medication. She feels her depression is fairly controlled and she is happy with her current dose of medication.   Hypertension: She is taking her medication daily. She is compliant with a low sodium diet.  She denies chest pain, palpitations, edema, shortness of breath and regular headaches. She does not monitor her blood pressure at home.    GERD:  She stopped taking the omeprazole.  She denies any GERD symptoms and feels her GERD is well controlled.   Prediabetes:  She is compliant with a low sugar/carbohydrate diet.  She is exercising some.   Insomnia:  She is taking ambien nightly.    Allergies:  Her allergies have been bad this year.  She is taking zyrtec.      Medications and allergies reviewed with patient and updated if appropriate.  Patient Active Problem List   Diagnosis Date Noted  . Left lumbar radiculopathy 06/04/2018  . Decreased creatinine clearance 06/04/2018  . Pain management contract signed 06/03/2018  . Encounter for pain management 03/05/2018  . Memory difficulties 03/05/2018  . Hormone replacement therapy (HRT) 12/04/2017  . Sialadenitis, right parotid 06/24/2017  . Insomnia 05/12/2017  . Prediabetes 11/08/2016  . Vitamin D deficiency 06/08/2009  . Depression 01/07/2009  . PEPTIC ULCER DISEASE, CHRONIC 01/07/2009  . OTHER OBSTRUCTION OF DUODENUM 01/07/2009  . HIATAL HERNIA 01/07/2009  . Irritable bowel syndrome 01/07/2009  . DEGENERATIVE JOINT DISEASE 01/07/2009  . ANEMIA, HX OF 01/07/2009  . PULMONARY NODULE 06/09/2008  . Anxiety 04/01/2008  . Essential hypertension 04/01/2008  . Allergic rhinitis 04/01/2008  . GERD 04/01/2008  . LOW BACK PAIN SYNDROME 04/01/2008  . Chronic migraine 04/01/2008    Current Outpatient Medications on File Prior to Visit  Medication Sig Dispense Refill  . ALPRAZolam (XANAX) 0.5 MG tablet TAKE 1 TABLET BY MOUTH THREE TIMES A DAY AS NEEDED FOR ANXIETY 90 tablet 1  . calcium-vitamin D (OSCAL 500/200 D-3) 500-200 MG-UNIT per tablet Take 1 tablet by mouth twice daily     . candesartan (ATACAND) 16 MG tablet TAKE 1 TABLET (16 MG TOTAL) BY MOUTH DAILY. 90 tablet 1  . clidinium-chlordiazePOXIDE (LIBRAX) 5-2.5 MG per capsule Take 1 capsule by mouth 2 (two)  times daily as needed. 180 capsule 0  . cyclobenzaprine (FLEXERIL) 10 MG tablet     . furosemide (LASIX) 20 MG tablet TAKE 1 TABLET BY MOUTH EVERY DAY 90 tablet 1  . gabapentin (NEURONTIN) 100 MG capsule Take 3 capsules (300 mg total) by mouth at bedtime. 270 capsule 0  . HYDROcodone-acetaminophen (NORCO) 7.5-325 MG tablet Take 1 tablet by mouth 3 (three) times daily as needed for severe pain (headaches). 15 tablet 0  . hyoscyamine  (LEVSIN, ANASPAZ) 0.125 MG tablet DISSOLVE 1 TABLET UNDER TONGUE EVERY 6 HOURS AS NEEDED 30 tablet 1  . meclizine (ANTIVERT) 25 MG tablet Take 1 tablet by mouth every 6 hours as needed for dizziness 90 tablet 3  . metoprolol succinate (TOPROL-XL) 100 MG 24 hr tablet TAKE 1 TABLET (100 MG TOTAL) BY MOUTH DAILY. TAKE WITH OR IMMEDIATELY FOLLOWING A MEAL. 90 tablet 3  . tiZANidine (ZANAFLEX) 4 MG tablet     . zolpidem (AMBIEN) 10 MG tablet TAKE 1 TABLET BY MOUTH AT BEDTIME AS NEEDED FOR SLEEP 30 tablet 1  . SUMAtriptan (IMITREX) 100 MG tablet Take 1 tablet by mouth  every 2 hours as needed for migraine. Max 2 tablets in  24 hours.    . SUMAtriptan (IMITREX) 20 MG/ACT nasal spray Place 1 spray into the nose.    . topiramate (TOPAMAX) 50 MG tablet Take 1.5 tablets by mouth daily.    Marland Kitchen venlafaxine XR (EFFEXOR-XR) 75 MG 24 hr capsule Take 225 mg by mouth.     No current facility-administered medications on file prior to visit.     Past Medical History:  Diagnosis Date  . Anemia   . Anxiety   . Arthritis   . Cataract   . Chronic migraine    follows with neuro for same  . Clotting disorder (HCC)    clot in ovarian vein- hx  . Esophagitis   . GERD (gastroesophageal reflux disease)   . Hypertension   . IBS (irritable bowel syndrome)   . Low back pain syndrome   . MRSA (methicillin resistant Staphylococcus aureus) infection 04/17/12   left torso  . Peptic ulcer disease 2003, 12/2012  . Peripheral vascular disease ([place])   . Vitamin D deficiency     Past Surgical History:  Procedure Laterality Date  . ABDOMINAL HYSTERECTOMY    . LAPAROSCOPIC BILATERAL SALPINGO OOPHERECTOMY    . pud surgery w/duod sticture-plasty and vagotomy  2003   Dr. Marlou Starks  . TONSILLECTOMY AND ADENOIDECTOMY     as a child  . TUBAL LIGATION  1984    Social History   Socioeconomic History  . Marital status: Married    Spouse name: Levada Dy  . Number of children: 1  . Years of education: Not on file  . Highest  education level: Not on file  Occupational History  . Occupation: retired  Scientific laboratory technician  . Financial resource strain: Not hard at all  . Food insecurity    Worry: Never true    Inability: Never true  . Transportation needs    Medical: No    Non-medical: No  Tobacco Use  . Smoking status: Never Smoker  . Smokeless tobacco: Never Used  Substance and Sexual Activity  . Alcohol use: Yes    Comment: rare  . Drug use: No  . Sexual activity: Yes  Lifestyle  . Physical activity    Days per week: 0 days    Minutes per session: 0 min  . Stress: To some extent  Relationships  . Social connections    Talks on phone: More than three times a week    Gets together: More than three times a week    Attends religious service: 1 to 4 times per year    Active member of club or organization: Yes    Attends meetings of clubs or organizations: 1 to 4 times per year    Relationship status: Married  Other Topics Concern  . Not on file  Social History Narrative   Lives with spouse    Grown son in [place], 2 g kids    Family History  Problem Relation Age of Onset  . Aneurysm Father   . Prostate cancer Brother   . Lung cancer Brother   . Colon cancer Neg Hx   . Esophageal cancer Neg Hx   . Rectal cancer Neg Hx   . Stomach cancer Neg Hx     Review of Systems  Constitutional: Negative for chills and fever.  Respiratory: Positive for cough (allergy related). Negative for shortness of breath and wheezing.   Cardiovascular: Negative for chest pain, palpitations and leg swelling.  Neurological: Positive for light-headedness and headaches.       Objective:   Vitals:   07/04/19 1110  BP: 134/86  Pulse: 67  Resp: 14  Temp: (!) 97.3 F (36.3 C)  SpO2: 97%   BP Readings from Last 3 Encounters:  07/04/19 134/86  12/26/18 (!) 138/92  09/07/18 (!) 142/92   Wt Readings from Last 3 Encounters:  07/04/19 155 lb (70.3 kg)  12/26/18 160 lb (72.6 kg)  09/07/18 159 lb (72.1 kg)   Body mass  index is 27.46 kg/m.   Physical Exam    Constitutional: Appears well-developed and well-nourished. No distress.  HENT:  Head: Normocephalic and atraumatic.  Neck: Neck supple. No tracheal deviation present. No thyromegaly present.  No cervical lymphadenopathy Cardiovascular: Normal rate, regular rhythm and normal heart sounds.   No murmur heard. No carotid bruit .  No edema Pulmonary/Chest: Effort normal and breath sounds normal. No respiratory distress. No has no wheezes. No rales.  Skin: Skin is warm and dry. Not diaphoretic.  Psychiatric: Normal mood and affect. Behavior is normal.      Assessment & Plan:    See Problem List for Assessment and Plan of chronic medical problems.

## 2019-07-04 ENCOUNTER — Other Ambulatory Visit (INDEPENDENT_AMBULATORY_CARE_PROVIDER_SITE_OTHER): Payer: Medicare HMO

## 2019-07-04 ENCOUNTER — Ambulatory Visit (INDEPENDENT_AMBULATORY_CARE_PROVIDER_SITE_OTHER): Payer: Medicare HMO | Admitting: Internal Medicine

## 2019-07-04 ENCOUNTER — Encounter: Payer: Self-pay | Admitting: Internal Medicine

## 2019-07-04 ENCOUNTER — Other Ambulatory Visit: Payer: Self-pay

## 2019-07-04 VITALS — BP 134/86 | HR 67 | Temp 97.3°F | Resp 14 | Ht 63.0 in | Wt 155.0 lb

## 2019-07-04 DIAGNOSIS — I10 Essential (primary) hypertension: Secondary | ICD-10-CM | POA: Diagnosis not present

## 2019-07-04 DIAGNOSIS — F3289 Other specified depressive episodes: Secondary | ICD-10-CM | POA: Diagnosis not present

## 2019-07-04 DIAGNOSIS — F419 Anxiety disorder, unspecified: Secondary | ICD-10-CM

## 2019-07-04 DIAGNOSIS — R7303 Prediabetes: Secondary | ICD-10-CM

## 2019-07-04 DIAGNOSIS — K219 Gastro-esophageal reflux disease without esophagitis: Secondary | ICD-10-CM

## 2019-07-04 DIAGNOSIS — R69 Illness, unspecified: Secondary | ICD-10-CM | POA: Diagnosis not present

## 2019-07-04 DIAGNOSIS — R52 Pain, unspecified: Secondary | ICD-10-CM

## 2019-07-04 DIAGNOSIS — G43709 Chronic migraine without aura, not intractable, without status migrainosus: Secondary | ICD-10-CM | POA: Diagnosis not present

## 2019-07-04 DIAGNOSIS — J301 Allergic rhinitis due to pollen: Secondary | ICD-10-CM

## 2019-07-04 DIAGNOSIS — G4700 Insomnia, unspecified: Secondary | ICD-10-CM | POA: Diagnosis not present

## 2019-07-04 DIAGNOSIS — IMO0002 Reserved for concepts with insufficient information to code with codable children: Secondary | ICD-10-CM

## 2019-07-04 LAB — CBC WITH DIFFERENTIAL/PLATELET
Basophils Absolute: 0 10*3/uL (ref 0.0–0.1)
Basophils Relative: 1.2 % (ref 0.0–3.0)
Eosinophils Absolute: 0.2 10*3/uL (ref 0.0–0.7)
Eosinophils Relative: 4.9 % (ref 0.0–5.0)
HCT: 38.3 % (ref 36.0–46.0)
Hemoglobin: 12.3 g/dL (ref 12.0–15.0)
Lymphocytes Relative: 31.2 % (ref 12.0–46.0)
Lymphs Abs: 1.3 10*3/uL (ref 0.7–4.0)
MCHC: 32.2 g/dL (ref 30.0–36.0)
MCV: 90.7 fl (ref 78.0–100.0)
Monocytes Absolute: 0.5 10*3/uL (ref 0.1–1.0)
Monocytes Relative: 11.7 % (ref 3.0–12.0)
Neutro Abs: 2.1 10*3/uL (ref 1.4–7.7)
Neutrophils Relative %: 51 % (ref 43.0–77.0)
Platelets: 205 10*3/uL (ref 150.0–400.0)
RBC: 4.22 Mil/uL (ref 3.87–5.11)
RDW: 14.3 % (ref 11.5–15.5)
WBC: 4.1 10*3/uL (ref 4.0–10.5)

## 2019-07-04 LAB — COMPREHENSIVE METABOLIC PANEL
ALT: 13 U/L (ref 0–35)
AST: 17 U/L (ref 0–37)
Albumin: 4.3 g/dL (ref 3.5–5.2)
Alkaline Phosphatase: 73 U/L (ref 39–117)
BUN: 22 mg/dL (ref 6–23)
CO2: 27 mEq/L (ref 19–32)
Calcium: 9.8 mg/dL (ref 8.4–10.5)
Chloride: 108 mEq/L (ref 96–112)
Creatinine, Ser: 1.35 mg/dL — ABNORMAL HIGH (ref 0.40–1.20)
GFR: 38.91 mL/min — ABNORMAL LOW (ref >60.00)
Glucose, Bld: 84 mg/dL (ref 70–99)
Potassium: 3.6 mEq/L (ref 3.5–5.1)
Sodium: 141 mEq/L (ref 135–145)
Total Bilirubin: 0.3 mg/dL (ref 0.2–1.2)
Total Protein: 7.5 g/dL (ref 6.0–8.3)

## 2019-07-04 LAB — HEMOGLOBIN A1C: Hgb A1c MFr Bld: 6.2 % (ref 4.6–6.5)

## 2019-07-04 LAB — LIPID PANEL
Cholesterol: 140 mg/dL (ref 0–200)
HDL: 45.8 mg/dL (ref >39.00)
LDL Cholesterol: 74 mg/dL (ref 0–99)
NonHDL: 94.37
Total CHOL/HDL Ratio: 3
Triglycerides: 104 mg/dL (ref 0.0–149.0)
VLDL: 20.8 mg/dL (ref 0.0–40.0)

## 2019-07-04 MED ORDER — MONTELUKAST SODIUM 10 MG PO TABS: 10.0000 mg | ORAL_TABLET | Freq: Every day | ORAL | 3 refills | Status: DC

## 2019-07-04 MED ORDER — MOMETASONE FUROATE 50 MCG/ACT NA SUSP: 2.0000 | Freq: Every day | NASAL | 12 refills | Status: DC

## 2019-07-04 NOTE — Assessment & Plan Note (Signed)
Check a1c Low sugar / carb diet Stressed regular exercise   

## 2019-07-04 NOTE — Assessment & Plan Note (Signed)
BP well controlled Current regimen effective and well tolerated Continue current medications at current doses CMP 

## 2019-07-04 NOTE — Assessment & Plan Note (Signed)
Fairly controlled Continue current dose of Effexor

## 2019-07-04 NOTE — Assessment & Plan Note (Signed)
occ GERD once every 2-3 weeks Not taking omeprazole daily Use Tums or rolaids prn

## 2019-07-04 NOTE — Assessment & Plan Note (Signed)
Fairly controlled Continue current dose of Effexor Continue Xanax

## 2019-07-04 NOTE — Assessment & Plan Note (Signed)
Following with neurology Continues to have severe, chronic headaches I am prescribing Vicodin for her to use only when needed-she uses this when the other medications for her migraines are not effective No side effects Taking medication appropriately The medication does help and we will continue Follow-up in 3 months

## 2019-07-04 NOTE — Assessment & Plan Note (Signed)
Continue Ambien Insomnia fairly controlled

## 2019-07-04 NOTE — Assessment & Plan Note (Signed)
Allergies are more severe and not ideally controlled She is taking Zyrtec daily She has taken this for a while-advised that she could try a different oral antihistamine Will try adding in Nasonex and Singulair Has seen allergist in the past and does not feel that seeing one now is necessary

## 2019-07-06 ENCOUNTER — Encounter: Payer: Self-pay | Admitting: Internal Medicine

## 2019-07-06 LAB — PAIN MGMT, PROFILE 8 W/CONF, U
6 Acetylmorphine: NEGATIVE ng/mL
Alcohol Metabolites: NEGATIVE ng/mL (ref <500)
Alphahydroxyalprazolam: 1282 ng/mL
Alphahydroxymidazolam: NEGATIVE ng/mL
Alphahydroxytriazolam: NEGATIVE ng/mL
Aminoclonazepam: NEGATIVE ng/mL
Amphetamines: NEGATIVE ng/mL
Benzodiazepines: POSITIVE ng/mL
Buprenorphine, Urine: NEGATIVE ng/mL
Cocaine Metabolite: NEGATIVE ng/mL
Creatinine: 184.6 mg/dL
Hydroxyethylflurazepam: NEGATIVE ng/mL
Lorazepam: NEGATIVE ng/mL
MDMA: NEGATIVE ng/mL
Marijuana Metabolite: NEGATIVE ng/mL
Nordiazepam: NEGATIVE ng/mL
Opiates: NEGATIVE ng/mL
Oxazepam: NEGATIVE ng/mL
Oxidant: NEGATIVE ug/mL
Oxycodone: NEGATIVE ng/mL
Temazepam: NEGATIVE ng/mL
pH: 5.6 (ref 4.5–9.0)

## 2019-07-15 ENCOUNTER — Other Ambulatory Visit: Payer: Self-pay | Admitting: Internal Medicine

## 2019-07-15 MED ORDER — HYDROCODONE-ACETAMINOPHEN 7.5-325 MG PO TABS: 1.0000 | ORAL_TABLET | Freq: Three times a day (TID) | ORAL | 0 refills | Status: DC | PRN

## 2019-07-15 NOTE — Telephone Encounter (Signed)
Last OV was 07/04/19 Next OV 10/04/19 Last RF 06/07/19

## 2019-07-23 ENCOUNTER — Encounter: Payer: Self-pay | Admitting: Internal Medicine

## 2019-07-30 ENCOUNTER — Encounter: Payer: Self-pay | Admitting: Internal Medicine

## 2019-07-30 ENCOUNTER — Other Ambulatory Visit: Payer: Self-pay | Admitting: Internal Medicine

## 2019-07-30 NOTE — Telephone Encounter (Signed)
Last RF 06/26/19 Last OV 07/04/19 Next OV 10/04/19

## 2019-08-08 ENCOUNTER — Other Ambulatory Visit: Payer: Self-pay | Admitting: Internal Medicine

## 2019-08-12 ENCOUNTER — Encounter: Payer: Self-pay | Admitting: Internal Medicine

## 2019-08-15 ENCOUNTER — Encounter: Payer: Self-pay | Admitting: Internal Medicine

## 2019-08-15 MED ORDER — EPINEPHRINE 0.3 MG/0.3ML IJ SOAJ: 0.3000 mg | INTRAMUSCULAR | 0 refills | Status: AC | PRN

## 2019-08-15 MED ORDER — SAXENDA 18 MG/3ML ~~LOC~~ SOPN: 0.6000 mg | PEN_INJECTOR | Freq: Every day | SUBCUTANEOUS | 3 refills | Status: DC

## 2019-08-19 ENCOUNTER — Encounter: Payer: Self-pay | Admitting: Internal Medicine

## 2019-08-19 MED ORDER — ZOLPIDEM TARTRATE 10 MG PO TABS: ORAL_TABLET | ORAL | 1 refills | Status: DC

## 2019-08-21 MED ORDER — ZOLPIDEM TARTRATE 10 MG PO TABS: ORAL_TABLET | ORAL | 1 refills | Status: DC

## 2019-08-21 NOTE — Telephone Encounter (Signed)
Pt has is interested in the PA did not know that would decrease cost. Please start PA process let know what else you need me to do.

## 2019-09-06 ENCOUNTER — Other Ambulatory Visit: Payer: Self-pay | Admitting: Internal Medicine

## 2019-09-06 MED ORDER — HYDROCODONE-ACETAMINOPHEN 7.5-325 MG PO TABS: 1.0000 | ORAL_TABLET | Freq: Three times a day (TID) | ORAL | 0 refills | Status: DC | PRN

## 2019-09-06 NOTE — Telephone Encounter (Signed)
Last OV 07/04/19 Next OV 10/04/19 Last RF 07/15/19

## 2019-09-25 DIAGNOSIS — R69 Illness, unspecified: Secondary | ICD-10-CM | POA: Diagnosis not present

## 2019-09-25 DIAGNOSIS — G43709 Chronic migraine without aura, not intractable, without status migrainosus: Secondary | ICD-10-CM | POA: Diagnosis not present

## 2019-09-25 DIAGNOSIS — G43009 Migraine without aura, not intractable, without status migrainosus: Secondary | ICD-10-CM | POA: Diagnosis not present

## 2019-09-26 ENCOUNTER — Other Ambulatory Visit: Payer: Self-pay | Admitting: Internal Medicine

## 2019-09-26 ENCOUNTER — Encounter: Payer: Self-pay | Admitting: Internal Medicine

## 2019-09-27 MED ORDER — ALPRAZOLAM 0.5 MG PO TABS: ORAL_TABLET | ORAL | 0 refills | Status: DC

## 2019-10-03 NOTE — Progress Notes (Signed)
Subjective:    Patient ID: Michaela Rios, female    DOB: 1951/08/19, 68 y.o.   MRN: [id number]  HPI The patient is here for follow up for chronic pain management.  She follows with neurology as well for her migraines, but I prescribe the narcotic.  Her migraines continue to be frequent and severe.   Indication for chronic opioid: chronic migraines Medication and dose: vicodin 7.5-325 mg prn # pills per month: 15 Last UDS date: 07/04/19 Pain contract signed (Y/N): 02/2018   Date narcotic database last reviewed : 10/04/2019 - last refill 10/22  Pain assessment:  Pain intensity:10 /10  Amount of pain relief with medication: good Use of pain medications: takes only when needed when other meds do not work Side effects: none Sleep:  Michaela Rios Mood: anxious Functional/social activities: continues to be active   Last took prescribed pain medication: yesterday Alcohol use:   no Tobacco use:   no Street Drug use:   no  She is taking her blood pressure medication daily as prescribed.  Her blood pressure is elevated here, but has been lower elsewhere.  She has not been checking it at home regularly.  She does have a blood pressure cuff and can check it.   Medications and allergies reviewed with patient and updated if appropriate.  Patient Active Problem List   Diagnosis Date Noted  . Left lumbar radiculopathy 06/04/2018  . Decreased creatinine clearance 06/04/2018  . Pain management contract signed 06/03/2018  . Encounter for pain management 03/05/2018  . Memory difficulties 03/05/2018  . Hormone replacement therapy (HRT) 12/04/2017  . Sialadenitis, right parotid 06/24/2017  . Insomnia 05/12/2017  . Prediabetes 11/08/2016  . Vitamin D deficiency 06/08/2009  . Depression 01/07/2009  . PEPTIC ULCER DISEASE, CHRONIC 01/07/2009  . OTHER OBSTRUCTION OF DUODENUM 01/07/2009  . HIATAL HERNIA 01/07/2009  . Irritable bowel syndrome 01/07/2009  . DEGENERATIVE JOINT DISEASE  01/07/2009  . ANEMIA, HX OF 01/07/2009  . PULMONARY NODULE 06/09/2008  . Anxiety 04/01/2008  . Essential hypertension 04/01/2008  . Allergic rhinitis 04/01/2008  . GERD 04/01/2008  . LOW BACK PAIN SYNDROME 04/01/2008  . Chronic migraine 04/01/2008    Current Outpatient Medications on File Prior to Visit  Medication Sig Dispense Refill  . ALPRAZolam (XANAX) 0.5 MG tablet TAKE 1 TABLET BY MOUTH THREE TIMES A DAY AS NEEDED FOR ANXIETY 90 tablet 0  . calcium-vitamin D (OSCAL 500/200 D-3) 500-200 MG-UNIT per tablet Take 1 tablet by mouth twice daily     . candesartan (ATACAND) 16 MG tablet TAKE 1 TABLET BY MOUTH EVERY DAY 90 tablet 0  . clidinium-chlordiazePOXIDE (LIBRAX) 5-2.5 MG per capsule Take 1 capsule by mouth 2 (two) times daily as needed. 180 capsule 0  . cyclobenzaprine (FLEXERIL) 10 MG tablet     . EPINEPHrine (EPIPEN 2-PAK) 0.3 mg/0.3 mL IJ SOAJ injection Inject 0.3 mLs (0.3 mg total) into the muscle as needed for anaphylaxis. 1 each 0  . furosemide (LASIX) 20 MG tablet TAKE 1 TABLET BY MOUTH EVERY DAY 90 tablet 1  . gabapentin (NEURONTIN) 100 MG capsule Take 3 capsules (300 mg total) by mouth at bedtime. 270 capsule 0  . HYDROcodone-acetaminophen (NORCO) 7.5-325 MG tablet Take 1 tablet by mouth 3 (three) times daily as needed for severe pain (headaches). 15 tablet 0  . hyoscyamine (LEVSIN, ANASPAZ) 0.125 MG tablet DISSOLVE 1 TABLET UNDER TONGUE EVERY 6 HOURS AS NEEDED 30 tablet 1  . meclizine (ANTIVERT) 25 MG tablet Take 1  tablet by mouth every 6 hours as needed for dizziness 90 tablet 3  . metoprolol succinate (TOPROL-XL) 100 MG 24 hr tablet TAKE 1 TABLET (100 MG TOTAL) BY MOUTH DAILY. TAKE WITH OR IMMEDIATELY FOLLOWING A MEAL. 90 tablet 3  . mometasone (NASONEX) 50 MCG/ACT nasal spray Place 2 sprays into the nose daily. 17 g 12  . montelukast (SINGULAIR) 10 MG tablet Take 1 tablet (10 mg total) by mouth at bedtime. 30 tablet 3  . tiZANidine (ZANAFLEX) 4 MG tablet     . topiramate  (TOPAMAX) 50 MG tablet Take 1.5 tablets by mouth daily.    Marland Kitchen zolpidem (AMBIEN) 10 MG tablet TAKE 1 TABLET BY MOUTH AT BEDTIME AS NEEDED FOR SLEEP 30 tablet 1  . SUMAtriptan (IMITREX) 100 MG tablet Take 1 tablet by mouth  every 2 hours as needed for migraine. Max 2 tablets in  24 hours.    . SUMAtriptan (IMITREX) 20 MG/ACT nasal spray Place 1 spray into the nose.    . venlafaxine XR (EFFEXOR-XR) 75 MG 24 hr capsule Take 225 mg by mouth.     No current facility-administered medications on file prior to visit.     Past Medical History:  Diagnosis Date  . Anemia   . Anxiety   . Arthritis   . Cataract   . Chronic migraine    follows with neuro for same  . Clotting disorder (HCC)    clot in ovarian vein- hx  . Esophagitis   . GERD (gastroesophageal reflux disease)   . Hypertension   . IBS (irritable bowel syndrome)   . Low back pain syndrome   . MRSA (methicillin resistant Staphylococcus aureus) infection 04/17/12   left torso  . Peptic ulcer disease 2003, 12/2012  . Peripheral vascular disease ([place])   . Vitamin D deficiency     Past Surgical History:  Procedure Laterality Date  . ABDOMINAL HYSTERECTOMY    . LAPAROSCOPIC BILATERAL SALPINGO OOPHERECTOMY    . pud surgery w/duod sticture-plasty and vagotomy  2003   Dr. Marlou Starks  . TONSILLECTOMY AND ADENOIDECTOMY     as a child  . TUBAL LIGATION  1984    Social History   Socioeconomic History  . Marital status: Married    Spouse name: Levada Dy  . Number of children: 1  . Years of education: Not on file  . Highest education level: Not on file  Occupational History  . Occupation: retired  Scientific laboratory technician  . Financial resource strain: Not hard at all  . Food insecurity    Worry: Never true    Inability: Never true  . Transportation needs    Medical: No    Non-medical: No  Tobacco Use  . Smoking status: Never Smoker  . Smokeless tobacco: Never Used  Substance and Sexual Activity  . Alcohol use: Yes    Comment: rare  . Drug  use: No  . Sexual activity: Yes  Lifestyle  . Physical activity    Days per week: 0 days    Minutes per session: 0 min  . Stress: To some extent  Relationships  . Social connections    Talks on phone: More than three times a week    Gets together: More than three times a week    Attends religious service: 1 to 4 times per year    Active member of club or organization: Yes    Attends meetings of clubs or organizations: 1 to 4 times per year    Relationship status:  Married  Other Topics Concern  . Not on file  Social History Narrative   Lives with spouse    Grown son in [place], 2 g kids    Family History  Problem Relation Age of Onset  . Aneurysm Father   . Prostate cancer Brother   . Lung cancer Brother   . Colon cancer Neg Hx   . Esophageal cancer Neg Hx   . Rectal cancer Neg Hx   . Stomach cancer Neg Hx     Review of Systems  Constitutional: Positive for fatigue. Negative for fever.  Respiratory: Negative for shortness of breath.   Cardiovascular: Negative for chest pain and palpitations.  Neurological: Positive for headaches.       Objective:   Vitals:   10/04/19 1347  BP: (!) 180/100  Pulse: 85  Temp: 98.4 F (36.9 C)  SpO2: 96%   BP Readings from Last 3 Encounters:  10/04/19 (!) 180/100  07/04/19 134/86  12/26/18 (!) 138/92   Wt Readings from Last 3 Encounters:  10/04/19 153 lb (69.4 kg)  07/04/19 155 lb (70.3 kg)  12/26/18 160 lb (72.6 kg)   Body mass index is 27.1 kg/m.   Physical Exam Constitutional:      General: She is not in acute distress.    Appearance: Normal appearance. She is not ill-appearing.  HENT:     Head: Normocephalic and atraumatic.  Skin:    General: Skin is warm and dry.  Neurological:     Mental Status: She is alert.  Psychiatric:        Mood and Affect: Mood normal.        Behavior: Behavior normal.        Thought Content: Thought content normal.        Judgment: Judgment normal.            Assessment &  Plan:    See Problem List for Assessment and Plan of chronic medical problems.

## 2019-10-03 NOTE — Patient Instructions (Addendum)
Have blood work done today.   Monitor your BP at home. Call if it is elevated.    Medications reviewed and updated.  Changes include :   none    Please followup in 3 months

## 2019-10-04 ENCOUNTER — Other Ambulatory Visit: Payer: Self-pay

## 2019-10-04 ENCOUNTER — Other Ambulatory Visit (INDEPENDENT_AMBULATORY_CARE_PROVIDER_SITE_OTHER): Payer: Medicare HMO

## 2019-10-04 ENCOUNTER — Ambulatory Visit (INDEPENDENT_AMBULATORY_CARE_PROVIDER_SITE_OTHER): Payer: Medicare HMO | Admitting: Internal Medicine

## 2019-10-04 ENCOUNTER — Encounter: Payer: Self-pay | Admitting: Internal Medicine

## 2019-10-04 VITALS — BP 180/100 | HR 85 | Temp 98.4°F | Ht 63.0 in | Wt 153.0 lb

## 2019-10-04 DIAGNOSIS — I10 Essential (primary) hypertension: Secondary | ICD-10-CM

## 2019-10-04 DIAGNOSIS — IMO0002 Reserved for concepts with insufficient information to code with codable children: Secondary | ICD-10-CM

## 2019-10-04 DIAGNOSIS — R52 Pain, unspecified: Secondary | ICD-10-CM | POA: Diagnosis not present

## 2019-10-04 DIAGNOSIS — G43709 Chronic migraine without aura, not intractable, without status migrainosus: Secondary | ICD-10-CM

## 2019-10-04 LAB — BASIC METABOLIC PANEL
BUN: 25 mg/dL — ABNORMAL HIGH (ref 6–23)
CO2: 26 mEq/L (ref 19–32)
Calcium: 9.4 mg/dL (ref 8.4–10.5)
Chloride: 108 mEq/L (ref 96–112)
Creatinine, Ser: 1.32 mg/dL — ABNORMAL HIGH (ref 0.40–1.20)
GFR: 39.9 mL/min — ABNORMAL LOW (ref >60.00)
Glucose, Bld: 93 mg/dL (ref 70–99)
Potassium: 3.6 mEq/L (ref 3.5–5.1)
Sodium: 143 mEq/L (ref 135–145)

## 2019-10-04 NOTE — Assessment & Plan Note (Signed)
Following with neurology for management of her migraines-has tried several medications in the past She takes Vicodin only when all other medications do not work and this medication is effective I will continue to prescribe it-she is taking the medication appropriately [place] controlled substance database checked-no refill needed Follow-up in 3 months

## 2019-10-04 NOTE — Assessment & Plan Note (Signed)
Takes Vicodin only as needed chronic migraines She is taking the medication appropriately-only takes it when all of the medications do not work [place] controlled substance database checked-no concerning behavior Refill not needed at this time No change in medication She will continue to follow with neurology Follow-up in 3 months

## 2019-10-04 NOTE — Assessment & Plan Note (Signed)
Blood pressure very elevated here She will start monitoring at home BMP today given decreased GFR with last blood work Advised her to let me know if her blood pressure is not controlled-we will need to revise medication

## 2019-10-08 MED ORDER — AMLODIPINE BESYLATE 5 MG PO TABS: 5.0000 mg | ORAL_TABLET | Freq: Every day | ORAL | 3 refills | Status: DC

## 2019-10-10 ENCOUNTER — Encounter: Payer: Self-pay | Admitting: Internal Medicine

## 2019-10-15 ENCOUNTER — Encounter: Payer: Self-pay | Admitting: Internal Medicine

## 2019-10-15 ENCOUNTER — Other Ambulatory Visit: Payer: Self-pay | Admitting: Internal Medicine

## 2019-10-16 MED ORDER — ZOLPIDEM TARTRATE 10 MG PO TABS: ORAL_TABLET | ORAL | 1 refills | Status: DC

## 2019-10-16 NOTE — Telephone Encounter (Signed)
[place] controlled substance database checked.  Ok to fill medication.  

## 2019-10-18 ENCOUNTER — Encounter: Payer: Self-pay | Admitting: Internal Medicine

## 2019-10-18 ENCOUNTER — Other Ambulatory Visit (INDEPENDENT_AMBULATORY_CARE_PROVIDER_SITE_OTHER): Payer: Medicare HMO

## 2019-10-18 ENCOUNTER — Other Ambulatory Visit: Payer: Self-pay

## 2019-10-18 DIAGNOSIS — I10 Essential (primary) hypertension: Secondary | ICD-10-CM

## 2019-10-18 LAB — BASIC METABOLIC PANEL
BUN: 29 mg/dL — ABNORMAL HIGH (ref 6–23)
CO2: 26 mEq/L (ref 19–32)
Calcium: 9.5 mg/dL (ref 8.4–10.5)
Chloride: 109 mEq/L (ref 96–112)
Creatinine, Ser: 1.19 mg/dL (ref 0.40–1.20)
GFR: 44.97 mL/min — ABNORMAL LOW (ref >60.00)
Glucose, Bld: 90 mg/dL (ref 70–99)
Potassium: 3.6 mEq/L (ref 3.5–5.1)
Sodium: 143 mEq/L (ref 135–145)

## 2019-10-29 ENCOUNTER — Other Ambulatory Visit: Payer: Self-pay | Admitting: Internal Medicine

## 2019-10-30 NOTE — Telephone Encounter (Signed)
Last OV 10/04/19 Next OV 01/06/20 Last RF 09/27/19

## 2019-11-14 ENCOUNTER — Other Ambulatory Visit: Payer: Self-pay | Admitting: Internal Medicine

## 2019-11-16 ENCOUNTER — Other Ambulatory Visit: Payer: Self-pay | Admitting: Internal Medicine

## 2019-11-17 ENCOUNTER — Other Ambulatory Visit: Payer: Self-pay | Admitting: Internal Medicine

## 2019-11-18 MED ORDER — HYDROCODONE-ACETAMINOPHEN 7.5-325 MG PO TABS: 1.0000 | ORAL_TABLET | Freq: Three times a day (TID) | ORAL | 0 refills | Status: DC | PRN

## 2019-11-18 NOTE — Telephone Encounter (Signed)
Check [place] registry last filled 09/19/2019.Marland KitchenJohny Chess

## 2019-12-03 ENCOUNTER — Other Ambulatory Visit: Payer: Self-pay | Admitting: Internal Medicine

## 2019-12-03 NOTE — Telephone Encounter (Signed)
Check [place] registry last filled 10/31/2019.Marland KitchenJohny Rios

## 2019-12-04 DIAGNOSIS — R69 Illness, unspecified: Secondary | ICD-10-CM | POA: Diagnosis not present

## 2019-12-17 ENCOUNTER — Encounter: Payer: Self-pay | Admitting: Internal Medicine

## 2019-12-18 MED ORDER — ZOLPIDEM TARTRATE 10 MG PO TABS: ORAL_TABLET | ORAL | 1 refills | Status: DC

## 2019-12-18 NOTE — Telephone Encounter (Signed)
Check [place] registry last filled 11/12/2019.Marland KitchenJohny Rios

## 2019-12-31 ENCOUNTER — Other Ambulatory Visit: Payer: Self-pay | Admitting: Internal Medicine

## 2019-12-31 MED ORDER — HYDROCODONE-ACETAMINOPHEN 7.5-325 MG PO TABS: 1.0000 | ORAL_TABLET | Freq: Three times a day (TID) | ORAL | 0 refills | Status: DC | PRN

## 2019-12-31 NOTE — Telephone Encounter (Signed)
Check [place] registry last filled 11/18/2019.Marland KitchenJohny Chess

## 2019-12-31 NOTE — Telephone Encounter (Signed)
Duplicate request 1st one has been forward to MD for approval../lmb

## 2020-01-01 NOTE — Telephone Encounter (Signed)
Check [place] registry last filled 12/03/2019.Marland KitchenJohny Chess

## 2020-01-05 NOTE — Progress Notes (Signed)
Subjective:    Patient ID: Michaela Rios, female    DOB: 1951-05-01, 69 y.o.   MRN: [id number]  HPI The patient is here for follow up of their chronic medical problems, including pain management, hypertension, migraines, prediabetes, anxiety, depression, insomnia  The patient is here for follow up for chronic pain management.  Indication for chronic opioid: chronic migraines.  Her migraines have been worse.  There are several days in the past week that she had to just go to bed.  Medication and dose: vicodin 7.5-325 mg prn # pills per month: 15 Last UDS date: 07/04/2019 Pain contract signed (Y/N): 02/2018 Date narcotic database last reviewed: 01/06/20   Pain assessment:  Pain intensity: 10/10 with severe migraines Amount of pain relief with medication: good Use of pain medications: takes when all other medications do not work Side effects: none Sleep: taking ambien Mood: Depression/anxiety-fairly controlled on current medication Functional/social activities: continues to be active in home setting   Last took prescribed pain medication: last week Alcohol use:   no Tobacco use:   no Street Drug use:    No   She is taking all of her medications as prescribed.   She is trying to do a little exercise.  She tries to limit sugars.   She does monitor her blood pressure at times.  It has been elevated and she thinks is related to her migraines.  Medications and allergies reviewed with patient and updated if appropriate.  Patient Active Problem List   Diagnosis Date Noted  . Left lumbar radiculopathy 06/04/2018  . Decreased creatinine clearance 06/04/2018  . Pain management contract signed 06/03/2018  . Encounter for pain management 03/05/2018  . Memory difficulties 03/05/2018  . Hormone replacement therapy (HRT) 12/04/2017  . Sialadenitis, right parotid 06/24/2017  . Insomnia 05/12/2017  . Prediabetes 11/08/2016  . Vitamin D deficiency 06/08/2009  . Depression  01/07/2009  . PEPTIC ULCER DISEASE, CHRONIC 01/07/2009  . OTHER OBSTRUCTION OF DUODENUM 01/07/2009  . HIATAL HERNIA 01/07/2009  . Irritable bowel syndrome 01/07/2009  . DEGENERATIVE JOINT DISEASE 01/07/2009  . ANEMIA, HX OF 01/07/2009  . PULMONARY NODULE 06/09/2008  . Anxiety 04/01/2008  . Essential hypertension 04/01/2008  . Allergic rhinitis 04/01/2008  . GERD 04/01/2008  . LOW BACK PAIN SYNDROME 04/01/2008  . Chronic migraine 04/01/2008    Current Outpatient Medications on File Prior to Visit  Medication Sig Dispense Refill  . ALPRAZolam (XANAX) 0.5 MG tablet TAKE 1 TABLET BY MOUTH THREE TIMES A DAY AS NEEDED FOR ANXIETY 90 tablet 0  . amLODipine (NORVASC) 5 MG tablet Take 1 tablet (5 mg total) by mouth daily. 30 tablet 3  . calcium-vitamin D (OSCAL 500/200 D-3) 500-200 MG-UNIT per tablet Take 1 tablet by mouth twice daily     . cyclobenzaprine (FLEXERIL) 10 MG tablet     . EPINEPHrine (EPIPEN 2-PAK) 0.3 mg/0.3 mL IJ SOAJ injection Inject 0.3 mLs (0.3 mg total) into the muscle as needed for anaphylaxis. 1 each 0  . furosemide (LASIX) 20 MG tablet TAKE 1 TABLET BY MOUTH EVERY DAY 90 tablet 1  . HYDROcodone-acetaminophen (NORCO) 7.5-325 MG tablet Take 1 tablet by mouth 3 (three) times daily as needed for severe pain (headaches). 15 tablet 0  . metoprolol succinate (TOPROL-XL) 100 MG 24 hr tablet TAKE 1 TABLET (100 MG TOTAL) BY MOUTH DAILY. TAKE WITH OR IMMEDIATELY FOLLOWING A MEAL. 90 tablet 3  . mometasone (NASONEX) 50 MCG/ACT nasal spray Place 2 sprays into the nose  daily. 17 g 12  . montelukast (SINGULAIR) 10 MG tablet TAKE 1 TABLET BY MOUTH EVERYDAY AT BEDTIME 90 tablet 1  . tiZANidine (ZANAFLEX) 4 MG tablet     . topiramate (TOPAMAX) 50 MG tablet Take 1.5 tablets by mouth daily.    Marland Kitchen zolpidem (AMBIEN) 10 MG tablet TAKE 1 TABLET BY MOUTH AT BEDTIME AS NEEDED FOR SLEEP 30 tablet 1  . SUMAtriptan (IMITREX) 100 MG tablet Take 1 tablet by mouth  every 2 hours as needed for migraine.  Max 2 tablets in  24 hours.    . SUMAtriptan (IMITREX) 20 MG/ACT nasal spray Place 1 spray into the nose.    . venlafaxine XR (EFFEXOR-XR) 75 MG 24 hr capsule Take 225 mg by mouth.     No current facility-administered medications on file prior to visit.    Past Medical History:  Diagnosis Date  . Anemia   . Anxiety   . Arthritis   . Cataract   . Chronic migraine    follows with neuro for same  . Clotting disorder (HCC)    clot in ovarian vein- hx  . Esophagitis   . GERD (gastroesophageal reflux disease)   . Hypertension   . IBS (irritable bowel syndrome)   . Low back pain syndrome   . MRSA (methicillin resistant Staphylococcus aureus) infection 04/17/12   left torso  . Peptic ulcer disease 2003, 12/2012  . Peripheral vascular disease ([employer])   . Vitamin D deficiency     Past Surgical History:  Procedure Laterality Date  . ABDOMINAL HYSTERECTOMY    . LAPAROSCOPIC BILATERAL SALPINGO OOPHERECTOMY    . pud surgery w/duod sticture-plasty and vagotomy  2003   Dr. Marlou Starks  . TONSILLECTOMY AND ADENOIDECTOMY     as a child  . TUBAL LIGATION  1984    Social History   Socioeconomic History  . Marital status: Married    Spouse name: Levada Dy  . Number of children: 1  . Years of education: Not on file  . Highest education level: Not on file  Occupational History  . Occupation: retired  Tobacco Use  . Smoking status: Never Smoker  . Smokeless tobacco: Never Used  Substance and Sexual Activity  . Alcohol use: Yes    Comment: rare  . Drug use: No  . Sexual activity: Yes  Other Topics Concern  . Not on file  Social History Narrative   Lives with spouse    Grown son in [place], 2 g kids   Social Determinants of Health   Financial Resource Strain:   . Difficulty of Paying Living Expenses: Not on file  Food Insecurity:   . Worried About Charity fundraiser in the Last Year: Not on file  . Ran Out of Food in the Last Year: Not on file  Transportation Needs:   . Lack of  Transportation (Medical): Not on file  . Lack of Transportation (Non-Medical): Not on file  Physical Activity:   . Days of Exercise per Week: Not on file  . Minutes of Exercise per Session: Not on file  Stress:   . Feeling of Stress : Not on file  Social Connections:   . Frequency of Communication with Friends and Family: Not on file  . Frequency of Social Gatherings with Friends and Family: Not on file  . Attends Religious Services: Not on file  . Active Member of Clubs or Organizations: Not on file  . Attends Archivist Meetings: Not on file  .  Marital Status: Not on file    Family History  Problem Relation Age of Onset  . Aneurysm Father   . Prostate cancer Brother   . Lung cancer Brother   . Colon cancer Neg Hx   . Esophageal cancer Neg Hx   . Rectal cancer Neg Hx   . Stomach cancer Neg Hx     Review of Systems  Constitutional: Negative for chills and fever.  HENT: Positive for postnasal drip (takes zyrtec).   Respiratory: Positive for cough (dry). Negative for shortness of breath and wheezing.   Cardiovascular: Negative for chest pain, palpitations and leg swelling.  Gastrointestinal:       Gerd  Neurological: Positive for headaches. Negative for dizziness and light-headedness.       Objective:   Vitals:   01/06/20 1412  BP: (!) 152/90  Pulse: 83  Resp: 16  Temp: 98.4 F (36.9 C)  SpO2: 94%   BP Readings from Last 3 Encounters:  01/06/20 (!) 152/90  10/04/19 (!) 180/100  07/04/19 134/86   Wt Readings from Last 3 Encounters:  01/06/20 155 lb 9.6 oz (70.6 kg)  10/04/19 153 lb (69.4 kg)  07/04/19 155 lb (70.3 kg)   Body mass index is 27.56 kg/m.   Physical Exam    Constitutional: Appears well-developed and well-nourished. No distress.  HENT:  Head: Normocephalic and atraumatic.  Neck: Neck supple. No tracheal deviation present. No thyromegaly present.  No cervical lymphadenopathy Cardiovascular: Normal rate, regular rhythm and normal  heart sounds.   No murmur heard. No carotid bruit .  No edema Pulmonary/Chest: Effort normal and breath sounds normal. No respiratory distress. No has no wheezes. No rales.  Skin: Skin is warm and dry. Not diaphoretic.  Psychiatric: Normal mood and affect. Behavior is normal.      Assessment & Plan:    See Problem List for Assessment and Plan of chronic medical problems.    This visit occurred during the SARS-CoV-2 public health emergency.  Safety protocols were in place, including screening questions prior to the visit, additional usage of staff PPE, and extensive cleaning of exam room while observing appropriate contact time as indicated for disinfecting solutions.

## 2020-01-05 NOTE — Patient Instructions (Addendum)
  Blood work was ordered.     Medications reviewed and updated.  Changes include :   start omeprazole daily.  Start hydralazine 25 mg twice daily for your BP.  Monitor your BP at home.   Your prescription(s) have been submitted to your pharmacy. Please take as directed and contact our office if you believe you are having problem(s) with the medication(s).    Please followup in 3 months

## 2020-01-06 ENCOUNTER — Other Ambulatory Visit: Payer: Self-pay

## 2020-01-06 ENCOUNTER — Encounter: Payer: Self-pay | Admitting: Internal Medicine

## 2020-01-06 ENCOUNTER — Ambulatory Visit (INDEPENDENT_AMBULATORY_CARE_PROVIDER_SITE_OTHER): Payer: Medicare HMO | Admitting: Internal Medicine

## 2020-01-06 VITALS — BP 152/90 | HR 83 | Temp 98.4°F | Resp 16 | Ht 63.0 in | Wt 155.6 lb

## 2020-01-06 DIAGNOSIS — R52 Pain, unspecified: Secondary | ICD-10-CM | POA: Diagnosis not present

## 2020-01-06 DIAGNOSIS — G4700 Insomnia, unspecified: Secondary | ICD-10-CM

## 2020-01-06 DIAGNOSIS — N1832 Chronic kidney disease, stage 3b: Secondary | ICD-10-CM | POA: Diagnosis not present

## 2020-01-06 DIAGNOSIS — F3289 Other specified depressive episodes: Secondary | ICD-10-CM

## 2020-01-06 DIAGNOSIS — I10 Essential (primary) hypertension: Secondary | ICD-10-CM | POA: Diagnosis not present

## 2020-01-06 DIAGNOSIS — R7303 Prediabetes: Secondary | ICD-10-CM

## 2020-01-06 DIAGNOSIS — G43709 Chronic migraine without aura, not intractable, without status migrainosus: Secondary | ICD-10-CM

## 2020-01-06 DIAGNOSIS — R69 Illness, unspecified: Secondary | ICD-10-CM | POA: Diagnosis not present

## 2020-01-06 DIAGNOSIS — F419 Anxiety disorder, unspecified: Secondary | ICD-10-CM

## 2020-01-06 DIAGNOSIS — IMO0002 Reserved for concepts with insufficient information to code with codable children: Secondary | ICD-10-CM

## 2020-01-06 LAB — LIPID PANEL
Cholesterol: 159 mg/dL (ref 0–200)
HDL: 46.8 mg/dL (ref >39.00)
LDL Cholesterol: 78 mg/dL (ref 0–99)
NonHDL: 111.89
Total CHOL/HDL Ratio: 3
Triglycerides: 171 mg/dL — ABNORMAL HIGH (ref 0.0–149.0)
VLDL: 34.2 mg/dL (ref 0.0–40.0)

## 2020-01-06 LAB — COMPREHENSIVE METABOLIC PANEL
ALT: 20 U/L (ref 0–35)
AST: 20 U/L (ref 0–37)
Albumin: 4.2 g/dL (ref 3.5–5.2)
Alkaline Phosphatase: 89 U/L (ref 39–117)
BUN: 25 mg/dL — ABNORMAL HIGH (ref 6–23)
CO2: 25 mEq/L (ref 19–32)
Calcium: 9.6 mg/dL (ref 8.4–10.5)
Chloride: 109 mEq/L (ref 96–112)
Creatinine, Ser: 1.2 mg/dL (ref 0.40–1.20)
GFR: 44.51 mL/min — ABNORMAL LOW (ref >60.00)
Glucose, Bld: 86 mg/dL (ref 70–99)
Potassium: 3.3 mEq/L — ABNORMAL LOW (ref 3.5–5.1)
Sodium: 142 mEq/L (ref 135–145)
Total Bilirubin: 0.3 mg/dL (ref 0.2–1.2)
Total Protein: 7.8 g/dL (ref 6.0–8.3)

## 2020-01-06 LAB — HEMOGLOBIN A1C: Hgb A1c MFr Bld: 6 % (ref 4.6–6.5)

## 2020-01-06 MED ORDER — OMEPRAZOLE 20 MG PO CPDR: 20.0000 mg | DELAYED_RELEASE_CAPSULE | Freq: Every day | ORAL | 3 refills | Status: DC

## 2020-01-06 MED ORDER — HYDRALAZINE HCL 25 MG PO TABS: 25.0000 mg | ORAL_TABLET | Freq: Three times a day (TID) | ORAL | 5 refills | Status: DC

## 2020-01-06 NOTE — Assessment & Plan Note (Signed)
Chronic Check a1c Low sugar / carb diet Stressed regular exercise  

## 2020-01-06 NOTE — Assessment & Plan Note (Signed)
Chronic Continue Michaela Rios

## 2020-01-06 NOTE — Assessment & Plan Note (Addendum)
BP Readings from Last 3 Encounters:  01/06/20 (!) 152/90  10/04/19 (!) 180/100  07/04/19 134/86   Chronic BP elevated here and at home - she thinks the BP is elevated bc of the headaches BP not controlled Start hydralazine 25 mg BID Continue current medications at current doses cmp

## 2020-01-06 NOTE — Assessment & Plan Note (Signed)
Chronic Following with neurology Migraines have been worse recently Using less norco - using more imitrex Tries not to use the norco - will use it only if needed Continue norco as last resort F/u in 3 months

## 2020-01-06 NOTE — Assessment & Plan Note (Signed)
Chronic pain from migraines Takes vicodin as needed as last resort after all medications did not work Has had increased migraines [place] controlled substance database checked.   No concerning behavior No refill needed at this time Continue  F/u in 3 months

## 2020-01-06 NOTE — Assessment & Plan Note (Signed)
Chronic Stressed better control of BP cmp

## 2020-01-06 NOTE — Assessment & Plan Note (Signed)
Chronic Fairly controlled, stable Continue current dose of medication  Effexor, xanax

## 2020-01-06 NOTE — Assessment & Plan Note (Signed)
Chronic Fairly ontrolled, stable Continue current dose of medication - effexor

## 2020-01-08 ENCOUNTER — Other Ambulatory Visit: Payer: Self-pay | Admitting: Internal Medicine

## 2020-01-08 MED ORDER — POTASSIUM CHLORIDE CRYS ER 20 MEQ PO TBCR: 20.0000 meq | EXTENDED_RELEASE_TABLET | Freq: Every day | ORAL | 3 refills | Status: DC

## 2020-01-21 ENCOUNTER — Encounter: Payer: Self-pay | Admitting: Internal Medicine

## 2020-01-21 ENCOUNTER — Other Ambulatory Visit: Payer: Self-pay

## 2020-01-21 MED ORDER — AMLODIPINE BESYLATE 5 MG PO TABS: 5.0000 mg | ORAL_TABLET | Freq: Every day | ORAL | 3 refills | Status: DC

## 2020-01-23 DIAGNOSIS — H524 Presbyopia: Secondary | ICD-10-CM | POA: Diagnosis not present

## 2020-01-29 ENCOUNTER — Other Ambulatory Visit: Payer: Self-pay | Admitting: Internal Medicine

## 2020-01-30 NOTE — Telephone Encounter (Signed)
Last OV was 01/06/20 Last RF 01/01/20

## 2020-02-19 ENCOUNTER — Encounter: Payer: Self-pay | Admitting: Internal Medicine

## 2020-02-19 ENCOUNTER — Other Ambulatory Visit: Payer: Self-pay | Admitting: Internal Medicine

## 2020-02-19 MED ORDER — HYDROCODONE-ACETAMINOPHEN 7.5-325 MG PO TABS: 1.0000 | ORAL_TABLET | Freq: Three times a day (TID) | ORAL | 0 refills | Status: DC | PRN

## 2020-02-19 MED ORDER — ZOLPIDEM TARTRATE 10 MG PO TABS: ORAL_TABLET | ORAL | 1 refills | Status: DC

## 2020-02-19 NOTE — Telephone Encounter (Signed)
Check  registry last filled 12/31/2019.Marland KitchenJohny Rios

## 2020-02-27 ENCOUNTER — Other Ambulatory Visit: Payer: Self-pay | Admitting: Internal Medicine

## 2020-02-27 NOTE — Telephone Encounter (Signed)
Last OV 01/06/20 Next OV 04/06/20 Last RF 01/30/20

## 2020-03-30 ENCOUNTER — Other Ambulatory Visit: Payer: Self-pay | Admitting: Internal Medicine

## 2020-03-30 ENCOUNTER — Telehealth: Payer: Self-pay | Admitting: Internal Medicine

## 2020-03-30 DIAGNOSIS — N1832 Chronic kidney disease, stage 3b: Secondary | ICD-10-CM

## 2020-03-30 NOTE — Progress Notes (Signed)
  Chronic Care Management   Note  03/30/2020 Name: ALTAGRACIA FELGAR MRN: [id number] DOB: [birth year]  Ibis Steven Deam is a 69 y.o. year old female who is a primary care patient of Burns, Claudina Lick, MD. I reached out to [place] by phone today in response to a referral sent by Ms. Cathleen Fears Arciniega's PCP, Binnie Rail, MD.   Ms. Faurot was given information about Chronic Care Management services today including:  1. CCM service includes personalized support from designated clinical staff supervised by her physician, including individualized plan of care and coordination with other care providers 2. 24/7 contact phone numbers for assistance for urgent and routine care needs. 3. Service will only be billed when office clinical staff spend 20 minutes or more in a month to coordinate care. 4. Only one practitioner may furnish and bill the service in a calendar month. 5. The patient may stop CCM services at any time (effective at the end of the month) by phone call to the office staff.   Patient agreed to services and verbal consent obtained.    This note is not being shared with the patient for the following reason: To respect privacy (The patient or proxy has requested that the information not be shared).  Follow up plan:  Raynicia Dukes [employer] Scheduler

## 2020-03-31 NOTE — Telephone Encounter (Signed)
Last OV 01/06/20, next OV 04/06/20, last refilled 02/27/20

## 2020-04-05 NOTE — Patient Instructions (Addendum)
  Blood work and urine tests were ordered.     Medications reviewed and updated.  Changes include :   none     Please followup in 3 months

## 2020-04-05 NOTE — Progress Notes (Signed)
Subjective:    Patient ID: Michaela Rios, female    DOB: [birth year], 69 y.o.   MRN: [id number]  HPI The patient is here for follow up of their chronic medical problems, including pain management for migraines, htn, anxiety, depression, insomnia, GERD, prediabetes.  She is taking all of her medications as prescribed.    chronic pain management.  Indication for chronic opioid: chronic migraines Medication and dose: vicodin 7.5-325 mg prn # pills per month: 15 Last UDS date: 04/06/20 Pain contract signed (Y/N): 04/06/20  Date narcotic database last reviewed: 04/06/20   Pain assessment:  Pain intensity: 10/10 when severe Amount of pain relief with medication: good Use of pain medications:  Takes prn when nothing else works Side effects:  none Sleep: taking ambien Mood: depression/anxiety Functional/social activities: continues to be active in home setting   Last took prescribed pain medication: yesterday Alcohol use:no Tobacco use: no Street Drug use: no    She is taking all her other medications as prescribed.    Since the second covid vaccine she has not felt well.  She developed a cough that has progressively gotten worse. It was dry when she was here last, now it is looser.  She eats cough drops all the time.  She has had post-tussive vomiting.  She has increased fatigue - does not feel like doing anything.  She has to make herself do everything.  She has runny nose.   She thinks she has a UTI.  Her symptoms started two months ago.  The symptoms are intermittent.  She has urgency, vaginal pain, she has a relief when she urinates and increased frequency..    Medications and allergies reviewed with patient and updated if appropriate.  Patient Active Problem List   Diagnosis Date Noted  . Left lumbar radiculopathy 06/04/2018  . CKD (chronic kidney disease) stage 3, GFR 30-59 ml/min 06/04/2018  . Pain management contract signed 06/03/2018  . Encounter for pain  management 03/05/2018  . Memory difficulties 03/05/2018  . Hormone replacement therapy (HRT) 12/04/2017  . Sialadenitis, right parotid 06/24/2017  . Insomnia 05/12/2017  . Prediabetes 11/08/2016  . Vitamin D deficiency 06/08/2009  . Depression 01/07/2009  . PEPTIC ULCER DISEASE, CHRONIC 01/07/2009  . OTHER OBSTRUCTION OF DUODENUM 01/07/2009  . HIATAL HERNIA 01/07/2009  . Irritable bowel syndrome 01/07/2009  . DEGENERATIVE JOINT DISEASE 01/07/2009  . ANEMIA, HX OF 01/07/2009  . PULMONARY NODULE 06/09/2008  . Anxiety 04/01/2008  . Essential hypertension 04/01/2008  . Allergic rhinitis 04/01/2008  . GERD 04/01/2008  . LOW BACK PAIN SYNDROME 04/01/2008  . Chronic migraine 04/01/2008    Current Outpatient Medications on File Prior to Visit  Medication Sig Dispense Refill  . ALPRAZolam (XANAX) 0.5 MG tablet TAKE 1 TABLET BY MOUTH THREE TIMES A DAY AS NEEDED FOR ANXIETY 90 tablet 0  . amLODipine (NORVASC) 5 MG tablet TAKE 1 TABLET BY MOUTH EVERY DAY 90 tablet 1  . calcium-vitamin D (OSCAL 500/200 D-3) 500-200 MG-UNIT per tablet Take 1 tablet by mouth twice daily     . cyclobenzaprine (FLEXERIL) 10 MG tablet     . EPINEPHrine (EPIPEN 2-PAK) 0.3 mg/0.3 mL IJ SOAJ injection Inject 0.3 mLs (0.3 mg total) into the muscle as needed for anaphylaxis. 1 each 0  . furosemide (LASIX) 20 MG tablet TAKE 1 TABLET BY MOUTH EVERY DAY 90 tablet 1  . hydrALAZINE (APRESOLINE) 25 MG tablet Take 1 tablet (25 mg total) by mouth 3 (three) times daily. 60 tablet  5  . HYDROcodone-acetaminophen (NORCO) 7.5-325 MG tablet Take 1 tablet by mouth 3 (three) times daily as needed for severe pain (headaches). 15 tablet 0  . metoprolol succinate (TOPROL-XL) 100 MG 24 hr tablet TAKE 1 TABLET (100 MG TOTAL) BY MOUTH DAILY. TAKE WITH OR IMMEDIATELY FOLLOWING A MEAL. 90 tablet 3  . omeprazole (PRILOSEC) 20 MG capsule Take 1 capsule (20 mg total) by mouth daily. 90 capsule 3  . tiZANidine (ZANAFLEX) 4 MG tablet     .  topiramate (TOPAMAX) 50 MG tablet Take 1.5 tablets by mouth daily.    Marland Kitchen zolpidem (AMBIEN) 10 MG tablet TAKE 1 TABLET BY MOUTH AT BEDTIME AS NEEDED FOR SLEEP 30 tablet 1  . SUMAtriptan (IMITREX) 100 MG tablet Take 1 tablet by mouth  every 2 hours as needed for migraine. Max 2 tablets in  24 hours.    . SUMAtriptan (IMITREX) 20 MG/ACT nasal spray Place 1 spray into the nose.    . venlafaxine XR (EFFEXOR-XR) 75 MG 24 hr capsule Take 225 mg by mouth.     No current facility-administered medications on file prior to visit.    Past Medical History:  Diagnosis Date  . Anemia   . Anxiety   . Arthritis   . Cataract   . Chronic migraine    follows with neuro for same  . Clotting disorder (HCC)    clot in ovarian vein- hx  . Esophagitis   . GERD (gastroesophageal reflux disease)   . Hypertension   . IBS (irritable bowel syndrome)   . Low back pain syndrome   . MRSA (methicillin resistant Staphylococcus aureus) infection 04/17/12   left torso  . Peptic ulcer disease 2003, 12/2012  . Peripheral vascular disease ([place])   . Vitamin D deficiency     Past Surgical History:  Procedure Laterality Date  . ABDOMINAL HYSTERECTOMY    . LAPAROSCOPIC BILATERAL SALPINGO OOPHERECTOMY    . pud surgery w/duod sticture-plasty and vagotomy  2003   Dr. Marlou Starks  . TONSILLECTOMY AND ADENOIDECTOMY     as a child  . TUBAL LIGATION  1984    Social History   Socioeconomic History  . Marital status: Married    Spouse name: Levada Dy  . Number of children: 1  . Years of education: Not on file  . Highest education level: Not on file  Occupational History  . Occupation: retired  Tobacco Use  . Smoking status: Never Smoker  . Smokeless tobacco: Never Used  Substance and Sexual Activity  . Alcohol use: Yes    Comment: rare  . Drug use: No  . Sexual activity: Yes  Other Topics Concern  . Not on file  Social History Narrative   Lives with spouse    Grown son in [place], 2 g kids   Social Determinants of Health     Financial Resource Strain:   . Difficulty of Paying Living Expenses:   Food Insecurity:   . Worried About Charity fundraiser in the Last Year:   . Arboriculturist in the Last Year:   Transportation Needs:   . Film/video editor (Medical):   Marland Kitchen Lack of Transportation (Non-Medical):   Physical Activity:   . Days of Exercise per Week:   . Minutes of Exercise per Session:   Stress:   . Feeling of Stress :   Social Connections:   . Frequency of Communication with Friends and Family:   . Frequency of Social Gatherings with Friends and  Family:   . Attends Religious Services:   . Active Member of Clubs or Organizations:   . Attends Archivist Meetings:   Marland Kitchen Marital Status:     Family History  Problem Relation Age of Onset  . Aneurysm Father   . Prostate cancer Brother   . Lung cancer Brother   . Colon cancer Neg Hx   . Esophageal cancer Neg Hx   . Rectal cancer Neg Hx   . Stomach cancer Neg Hx     Review of Systems  Constitutional: Positive for chills (gets hot/then cold) and fatigue. Negative for fever.  HENT: Positive for rhinorrhea. Negative for congestion, postnasal drip (but has tickle) and sinus pressure.   Eyes: Negative for visual disturbance.  Respiratory: Positive for cough and shortness of breath (occ - with ambulation). Negative for wheezing.   Cardiovascular: Positive for chest pain (a couple of times since vaccine). Negative for palpitations and leg swelling (mild at ankles).  Genitourinary: Positive for frequency and urgency. Negative for difficulty urinating, dysuria and hematuria.  Neurological: Positive for dizziness, light-headedness and headaches (most right side of nostril).       Objective:   Vitals:   04/06/20 1342  BP: (!) 142/90  Pulse: 87  Resp: 16  Temp: 99 F (37.2 C)  SpO2: 90%   BP Readings from Last 3 Encounters:  04/06/20 (!) 142/90  01/06/20 (!) 152/90  10/04/19 (!) 180/100   Wt Readings from Last 3 Encounters:   04/06/20 153 lb (69.4 kg)  01/06/20 155 lb 9.6 oz (70.6 kg)  10/04/19 153 lb (69.4 kg)   Body mass index is 27.1 kg/m.   Physical Exam    Constitutional: Appears well-developed and well-nourished. No distress.  HENT:  Head: Normocephalic and atraumatic.  Neck: Neck supple. No tracheal deviation present. No thyromegaly present.  No cervical lymphadenopathy.   Ears, Mouth:  Normal ear canals and TM b/l, no oropharynx erythema Cardiovascular: Normal rate, regular rhythm and normal heart sounds.   No murmur heard. No carotid bruit .  No edema Pulmonary/Chest: Effort normal and breath sounds normal. No respiratory distress. No has no wheezes. No rales.  Skin: Skin is warm and dry. Not diaphoretic.  Psychiatric: Normal mood and affect. Behavior is normal.      Assessment & Plan:    See Problem List for Assessment and Plan of chronic medical problems.    This visit occurred during the SARS-CoV-2 public health emergency.  Safety protocols were in place, including screening questions prior to the visit, additional usage of staff PPE, and extensive cleaning of exam room while observing appropriate contact time as indicated for disinfecting solutions.

## 2020-04-06 ENCOUNTER — Other Ambulatory Visit: Payer: Self-pay

## 2020-04-06 ENCOUNTER — Encounter: Payer: Self-pay | Admitting: Internal Medicine

## 2020-04-06 ENCOUNTER — Ambulatory Visit (INDEPENDENT_AMBULATORY_CARE_PROVIDER_SITE_OTHER): Payer: Medicare HMO | Admitting: Internal Medicine

## 2020-04-06 VITALS — BP 142/90 | HR 87 | Temp 99.0°F | Resp 16 | Ht 63.0 in | Wt 153.0 lb

## 2020-04-06 DIAGNOSIS — R7303 Prediabetes: Secondary | ICD-10-CM

## 2020-04-06 DIAGNOSIS — K219 Gastro-esophageal reflux disease without esophagitis: Secondary | ICD-10-CM

## 2020-04-06 DIAGNOSIS — F419 Anxiety disorder, unspecified: Secondary | ICD-10-CM

## 2020-04-06 DIAGNOSIS — J301 Allergic rhinitis due to pollen: Secondary | ICD-10-CM

## 2020-04-06 DIAGNOSIS — F3289 Other specified depressive episodes: Secondary | ICD-10-CM

## 2020-04-06 DIAGNOSIS — R52 Pain, unspecified: Secondary | ICD-10-CM

## 2020-04-06 DIAGNOSIS — I10 Essential (primary) hypertension: Secondary | ICD-10-CM | POA: Diagnosis not present

## 2020-04-06 DIAGNOSIS — R69 Illness, unspecified: Secondary | ICD-10-CM | POA: Diagnosis not present

## 2020-04-06 DIAGNOSIS — R3915 Urgency of urination: Secondary | ICD-10-CM | POA: Diagnosis not present

## 2020-04-06 DIAGNOSIS — IMO0002 Reserved for concepts with insufficient information to code with codable children: Secondary | ICD-10-CM

## 2020-04-06 DIAGNOSIS — G4700 Insomnia, unspecified: Secondary | ICD-10-CM

## 2020-04-06 DIAGNOSIS — G43709 Chronic migraine without aura, not intractable, without status migrainosus: Secondary | ICD-10-CM

## 2020-04-06 DIAGNOSIS — N1832 Chronic kidney disease, stage 3b: Secondary | ICD-10-CM | POA: Diagnosis not present

## 2020-04-06 LAB — COMPREHENSIVE METABOLIC PANEL
ALT: 14 U/L (ref 0–35)
AST: 18 U/L (ref 0–37)
Albumin: 4.5 g/dL (ref 3.5–5.2)
Alkaline Phosphatase: 93 U/L (ref 39–117)
BUN: 27 mg/dL — ABNORMAL HIGH (ref 6–23)
CO2: 29 mEq/L (ref 19–32)
Calcium: 10.2 mg/dL (ref 8.4–10.5)
Chloride: 107 mEq/L (ref 96–112)
Creatinine, Ser: 1.43 mg/dL — ABNORMAL HIGH (ref 0.40–1.20)
GFR: 36.33 mL/min — ABNORMAL LOW (ref >60.00)
Glucose, Bld: 90 mg/dL (ref 70–99)
Potassium: 3.8 mEq/L (ref 3.5–5.1)
Sodium: 142 mEq/L (ref 135–145)
Total Bilirubin: 0.3 mg/dL (ref 0.2–1.2)
Total Protein: 8.2 g/dL (ref 6.0–8.3)

## 2020-04-06 LAB — HEMOGLOBIN A1C: Hgb A1c MFr Bld: 5.5 % (ref 4.6–6.5)

## 2020-04-06 LAB — LIPID PANEL
Cholesterol: 150 mg/dL (ref 0–200)
HDL: 44.4 mg/dL (ref >39.00)
LDL Cholesterol: 71 mg/dL (ref 0–99)
NonHDL: 105.88
Total CHOL/HDL Ratio: 3
Triglycerides: 173 mg/dL — ABNORMAL HIGH (ref 0.0–149.0)
VLDL: 34.6 mg/dL (ref 0.0–40.0)

## 2020-04-06 NOTE — Assessment & Plan Note (Signed)
Subacute Has been having symptoms for 2 months Urinary urgency, urinary frequency and some vaginal discomfort ?  UTI or not Urinalysis, urine culture-we will hold off on antibiotics until results return

## 2020-04-06 NOTE — Assessment & Plan Note (Signed)
Some of her symptoms she is concerned about with the runny nose, cough may possibly be related to seasonal allergies She is taking her allergy medication we will continue She does have some possible sinus pressure, but that may also be part of her migraines We will hold off on an antibiotic at this time If no improvement will consider starting an antibiotic

## 2020-04-06 NOTE — Assessment & Plan Note (Signed)
Chronic Take ambien nightly Effective Can not sleep w/o Michaela Rios Will continue

## 2020-04-06 NOTE — Assessment & Plan Note (Signed)
Chronic Pain management for migraines, which are severe Has daily headaches Following with neurology Uses all of her migraine medications first and if nothing works then she will turn toward the Vicodin, which does help [place] controlled substance database checked-no signs of aberrant behavior Not due for refill today We will continue current medication at current dose Follow-up in 3 months Pain contract signed today and UDS today

## 2020-04-06 NOTE — Assessment & Plan Note (Signed)
Chronic Has been stable CMP

## 2020-04-06 NOTE — Assessment & Plan Note (Signed)
Chronic Following with neurology Migraines unchanged Takes Vicodin only as needed when all of her other medications do not help Vicodin tends to be effective We will continue prescribing at current dose Follow-up in 3 months

## 2020-04-06 NOTE — Assessment & Plan Note (Signed)
Chronic Check a1c Low sugar / carb diet Stressed regular exercise  

## 2020-04-06 NOTE — Assessment & Plan Note (Signed)
Chronic GERD intermittent - takes medication as needed controlled Continue prn medication

## 2020-04-06 NOTE — Assessment & Plan Note (Signed)
Chronic Fairly controlled, stable Continue current dose of medication-Effexor

## 2020-04-06 NOTE — Assessment & Plan Note (Signed)
Chronic BP controlled at home but she does not check it often Current regimen effective and well tolerated Continue current medications at current doses Advised her to check her BP more regularly cmp

## 2020-04-06 NOTE — Assessment & Plan Note (Signed)
Chronic Stable, controlled Continue Effexor daily and Xanax

## 2020-04-07 LAB — URINALYSIS, ROUTINE W REFLEX MICROSCOPIC
Bilirubin Urine: NEGATIVE
Ketones, ur: NEGATIVE
Nitrite: POSITIVE — AB
Specific Gravity, Urine: 1.015 (ref 1.000–1.030)
Total Protein, Urine: NEGATIVE
Urine Glucose: NEGATIVE
Urobilinogen, UA: 0.2 (ref 0.0–1.0)
pH: 5.5 (ref 5.0–8.0)

## 2020-04-08 ENCOUNTER — Other Ambulatory Visit: Payer: Self-pay | Admitting: Internal Medicine

## 2020-04-08 LAB — URINE CULTURE

## 2020-04-08 LAB — TIQ-MISC

## 2020-04-08 MED ORDER — CIPROFLOXACIN HCL 250 MG PO TABS: 250.0000 mg | ORAL_TABLET | Freq: Two times a day (BID) | ORAL | 0 refills | Status: DC

## 2020-04-17 ENCOUNTER — Other Ambulatory Visit: Payer: Self-pay | Admitting: Internal Medicine

## 2020-04-17 ENCOUNTER — Telehealth: Payer: Self-pay | Admitting: Internal Medicine

## 2020-04-17 DIAGNOSIS — R3915 Urgency of urination: Secondary | ICD-10-CM

## 2020-04-17 NOTE — Telephone Encounter (Signed)
Lets recheck her urine

## 2020-04-17 NOTE — Telephone Encounter (Signed)
Patient was seen on 5/10 for a UTI. She has finished her ABX but still having problems with feeling like she has go all the time. No more pain. She would like to know if anything else can be done. Please follow up with patient.

## 2020-04-17 NOTE — Telephone Encounter (Signed)
Orders put in. Pt aware.

## 2020-04-20 ENCOUNTER — Other Ambulatory Visit: Payer: Medicare HMO

## 2020-04-21 ENCOUNTER — Encounter: Payer: Self-pay | Admitting: Internal Medicine

## 2020-04-21 ENCOUNTER — Other Ambulatory Visit: Payer: Self-pay | Admitting: Internal Medicine

## 2020-04-21 MED ORDER — ZOLPIDEM TARTRATE 10 MG PO TABS: ORAL_TABLET | ORAL | 1 refills | Status: DC

## 2020-04-21 MED ORDER — HYDROCODONE-ACETAMINOPHEN 7.5-325 MG PO TABS: 1.0000 | ORAL_TABLET | Freq: Three times a day (TID) | ORAL | 0 refills | Status: DC | PRN

## 2020-04-21 NOTE — Telephone Encounter (Signed)
Check [place] registry last filled 03/19/2020.Marland KitchenJohny Rios

## 2020-04-21 NOTE — Telephone Encounter (Signed)
Last OV 04/06/20 Next OV 07/03/20 Last RF 02/19/20

## 2020-04-27 ENCOUNTER — Other Ambulatory Visit: Payer: Self-pay | Admitting: Internal Medicine

## 2020-05-01 ENCOUNTER — Other Ambulatory Visit: Payer: Self-pay | Admitting: Internal Medicine

## 2020-05-10 ENCOUNTER — Other Ambulatory Visit: Payer: Self-pay | Admitting: Internal Medicine

## 2020-05-11 NOTE — Chronic Care Management (AMB) (Deleted)
Chronic Care Management Pharmacy  Name: Michaela Rios  MRN: [id number] DOB: 08-20-1951  Chief Complaint/ HPI  Michaela Rios,  69 y.o. , female presents for their Initial CCM visit with the clinical pharmacist via telephone due to COVID-19 Pandemic.  PCP : Binnie Rail, MD  Their chronic conditions include: HTN, allergic rhinitis, GERD, migraine, IBS, Chronic pain, CKD, anxiety, depression, insomnia, pre-diabetes  Office Visits: 04/06/20: Patient presented to Dr. [name] for pain management. Patient with urinary urgency, vaginal pain, increased frequency. UCx positive for K.Pneumonia. Patient started on Ciprofloxacin 250 mg x 10 days. Nasonex, potassium stopped.  01/06/20: Patient presented to Dr. [name] for pain management. BP in clinic 152/90. Home BP elevated, possibly due to migraines. Patient started on hydralazine 25 mg TID for HTN, omeprazole 20 mg for GERD.  Consult Visit: 01/23/20: Patient presented to Dr. Nicki Reaper (optometry) for presbyopia.   Medications: Outpatient Encounter Medications as of 05/12/2020  Medication Sig Note  . ALPRAZolam (XANAX) 0.5 MG tablet TAKE 1 TABLET BY MOUTH THREE TIMES A DAY AS NEEDED FOR ANXIETY   . amLODipine (NORVASC) 5 MG tablet TAKE ONE TABLET BY MOUTH DAILY   . calcium-vitamin D (OSCAL 500/200 D-3) 500-200 MG-UNIT per tablet Take 1 tablet by mouth twice daily    . ciprofloxacin (CIPRO) 250 MG tablet Take 1 tablet (250 mg total) by mouth 2 (two) times daily.   . cyclobenzaprine (FLEXERIL) 10 MG tablet    . EPINEPHrine (EPIPEN 2-PAK) 0.3 mg/0.3 mL IJ SOAJ injection Inject 0.3 mLs (0.3 mg total) into the muscle as needed for anaphylaxis.   . furosemide (LASIX) 20 MG tablet TAKE 1 TABLET BY MOUTH EVERY DAY   . hydrALAZINE (APRESOLINE) 25 MG tablet Take 1 tablet (25 mg total) by mouth 3 (three) times daily.   Marland Kitchen HYDROcodone-acetaminophen (NORCO) 7.5-325 MG tablet Take 1 tablet by mouth 3 (three) times daily as needed for severe pain (headaches).   .  metoprolol succinate (TOPROL-XL) 100 MG 24 hr tablet TAKE 1 TABLET (100 MG TOTAL) BY MOUTH DAILY. TAKE WITH OR IMMEDIATELY FOLLOWING A MEAL.   Marland Kitchen omeprazole (PRILOSEC) 20 MG capsule Take 1 capsule (20 mg total) by mouth daily.   . SUMAtriptan (IMITREX) 100 MG tablet Take 1 tablet by mouth  every 2 hours as needed for migraine. Max 2 tablets in  24 hours. 10/20/2015: Received from: [facility]  . SUMAtriptan (IMITREX) 20 MG/ACT nasal spray Place 1 spray into the nose. 03/18/2015: Received from: [facility]  . tiZANidine (ZANAFLEX) 4 MG tablet    . topiramate (TOPAMAX) 50 MG tablet Take 1.5 tablets by mouth daily.   Marland Kitchen venlafaxine XR (EFFEXOR-XR) 75 MG 24 hr capsule Take 225 mg by mouth. 03/18/2015: Received from: [facility]  . zolpidem (AMBIEN) 10 MG tablet TAKE 1 TABLET BY MOUTH AT BEDTIME AS NEEDED FOR SLEEP    No facility-administered encounter medications on file as of 05/12/2020.     Current Diagnosis/Assessment:  Goals Addressed   None     Pre-Diabetes   Recent Relevant Labs: Lab Results  Component Value Date/Time   HGBA1C 5.5 04/06/2020 02:23 PM   HGBA1C 6.0 01/06/2020 03:16 PM     Checking BG: {CHL HP Blood Glucose Monitoring [phone]}  Recent FBG Readings: *** Recent pre-meal BG readings: *** Recent 2hr PP BG readings:  *** Recent HS BG readings: *** Patient has failed these meds in past: *** Patient is currently controlled on the following medications: none  Last diabetic Foot exam: No  results found for: HMDIABEYEEXA  Last diabetic Eye exam: No results found for: HMDIABFOOTEX   We discussed: {CHL HP Upstream Pharmacy discussion:[phone]}  Plan  Continue {CHL HP Upstream Pharmacy [phone]}  Hypertension   BP today is:  {CHL HP UPSTREAM Pharmacist BP ranges:[id number]}  Office blood pressures are  BP Readings from Last 3 Encounters:  04/06/20 (!) 142/90  01/06/20 (!) 152/90  10/04/19 (!) 180/100   CMP Latest Ref Rng & Units  04/06/2020 01/06/2020 10/18/2019  Glucose 70 - 99 mg/dL 90 86 90  BUN 6 - 23 mg/dL 27(H) 25(H) 29(H)  Creatinine 0.40 - 1.20 mg/dL 1.43(H) 1.20 1.19  Sodium 135 - 145 mEq/L 142 142 143  Potassium 3.5 - 5.1 mEq/L 3.8 3.3(L) 3.6  Chloride 96 - 112 mEq/L 107 109 109  CO2 19 - 32 mEq/L 29 25 26   Calcium 8.4 - 10.5 mg/dL 10.2 9.6 9.5  Total Protein 6.0 - 8.3 g/dL 8.2 7.8 -  Total Bilirubin 0.2 - 1.2 mg/dL 0.3 0.3 -  Alkaline Phos 39 - 117 U/L 93 89 -  AST 0 - 37 U/L 18 20 -  ALT 0 - 35 U/L 14 20 -   Patient has failed these meds in the past: Candesartan, lisinopril (cough), felodipine, verapamil Patient is currently {CHL Controlled/[phone]} on the following medications:   Amlodipine 5 mg daily   Furosemide 20 mg daily   Hydralazine 25 mg TID   Metoprolol XL 100 mg daily    Patient checks BP at home {CHL HP BP Monitoring Frequency:[phone]}  Patient home BP readings are ranging: ***  We discussed {CHL HP Upstream Pharmacy discussion:[phone]}  Plan  Continue {CHL HP Upstream Pharmacy [phone]}   Anxiety/ Depression   PHQ9 SCORE ONLY 09/07/2018 05/09/2016 05/14/2014  PHQ-9 Total Score 10 0 0   Patient has failed these meds in past: *** Patient is currently {CHL Controlled/[phone]} on the following medications:   Alprazolam 0.5 mg TID PRN   Venlafaxine XR 75 mg 3 caps   We discussed:  ***  Plan  Continue {CHL HP Upstream Pharmacy [phone]}   Insmonia   Patient has failed these meds in past: *** Patient is currently {CHL Controlled/[phone]} on the following medications:   Zolpidem 10 mg QHS PRN   We discussed:  ***  Plan  Continue {CHL HP Upstream Pharmacy [phone]}    Chronic Pain    Patient has failed these meds in past: *** Patient is currently {CHL Controlled/[phone]} on the following medications:   Cyclobenzaprine 10 mg   Tizanidine 4 mg    Hydrocodone-APAP 7.5-325 TID  PRN Severe Headache Pain We discussed:  ***  Plan  Continue {CHL HP Upstream Pharmacy [phone]}  Migraine   Patient has failed these meds in past: *** Patient is currently {CHL Controlled/[phone]} on the following medications:   Sumatriptan 100 mg   Sumatriptan 70m/act nasal spray   Hydrocodone-APAP 7.5-325 TID PRN Severe Headache Pain    Topiramate 50 mg 1.5 tabs daily   We discussed:  ***  Plan  Continue {CHL HP Upstream Pharmacy [phone]}   Misc/OTC    Calcium+ Vit D 500mg /200units 1 tab BID  Ciprofloxacin 250 mg BID  Epinephrine 0.3 mg PRN  Omeprazole 20 mg daily   We discussed:  ***  Plan  Continue {CHL HP Upstream Pharmacy FUXNA:[id number]}  Vaccines   Reviewed and discussed patient's vaccination history.    Immunization History  Administered Date(s) Administered  . PFIZER SARS-COV-2 Vaccination 01/11/2020, 02/05/2020  . Tdap 06/21/2013  Plan  Recommended patient receive *** vaccine in *** office/pharmacy.   Medication Management   Pt uses [facility] for all medications Uses pill box? {Yes or If no, why not?:20788} Pt endorses ***% compliance  We discussed: ***  Plan  {US Pharmacy KDTO:67124}    Follow up: *** month phone visit  ***

## 2020-05-12 ENCOUNTER — Telehealth: Payer: Medicare HMO

## 2020-05-12 ENCOUNTER — Other Ambulatory Visit: Payer: Self-pay

## 2020-05-12 ENCOUNTER — Ambulatory Visit: Payer: Medicare HMO | Admitting: Pharmacist

## 2020-05-12 DIAGNOSIS — F419 Anxiety disorder, unspecified: Secondary | ICD-10-CM

## 2020-05-12 DIAGNOSIS — I10 Essential (primary) hypertension: Secondary | ICD-10-CM

## 2020-05-12 DIAGNOSIS — F3289 Other specified depressive episodes: Secondary | ICD-10-CM

## 2020-05-12 NOTE — Addendum Note (Signed)
Addended by: Aviva Signs M on: 05/12/2020 09:03 AM   Modules accepted: Orders

## 2020-05-12 NOTE — Chronic Care Management (AMB) (Signed)
Chronic Care Management Pharmacy  Name: Michaela Rios  MRN: [id number] DOB: 1951-11-20  Chief Complaint/ HPI  Michaela Rios,  69 y.o. , female presents for their Initial CCM visit with the clinical pharmacist via telephone due to COVID-19 Pandemic.  PCP : Binnie Rail, MD  Their chronic conditions include: HTN, allergic rhinitis, GERD, migraine, IBS, Chronic pain, CKD, anxiety, depression, insomnia, pre-diabetes  Born and raised in [place], met her husband in the 22s at [place] - she worked in Engineer, site. Son is in [place] with his family, 3 grandchildren age 59-10. Has been able to visit a few times.    Office Visits: 04/06/20: Patient presented to Dr. [name] for pain management. Patient with urinary urgency, vaginal pain, increased frequency. UCx positive for K.Pneumonia. Patient started on Ciprofloxacin 250 mg x 10 days. Nasonex, potassium stopped.   01/06/20: Patient presented to Dr. [name] for pain management. BP in clinic 152/90. Home BP elevated, possibly due to migraines. Patient started on hydralazine 25 mg TID for HTN, omeprazole 20 mg for GERD.     Consult Visit: 01/23/20: Patient presented to Dr. Nicki Reaper (optometry) for presbyopia.   Medications: Outpatient Encounter Medications as of 05/12/2020  Medication Sig Note  . ALPRAZolam (XANAX) 0.5 MG tablet TAKE 1 TABLET BY MOUTH THREE TIMES A DAY AS NEEDED FOR ANXIETY   . amLODipine (NORVASC) 5 MG tablet TAKE ONE TABLET BY MOUTH DAILY   . calcium-vitamin D (OSCAL 500/200 D-3) 500-200 MG-UNIT per tablet Take 1 tablet by mouth twice daily    . ciprofloxacin (CIPRO) 250 MG tablet Take 1 tablet (250 mg total) by mouth 2 (two) times daily.   . cyclobenzaprine (FLEXERIL) 10 MG tablet    . EPINEPHrine (EPIPEN 2-PAK) 0.3 mg/0.3 mL IJ SOAJ injection Inject 0.3 mLs (0.3 mg total) into the muscle as needed for anaphylaxis.   . furosemide (LASIX) 20 MG tablet TAKE 1 TABLET BY MOUTH EVERY DAY   . hydrALAZINE (APRESOLINE) 25 MG tablet Take  1 tablet (25 mg total) by mouth 3 (three) times daily.   Marland Kitchen HYDROcodone-acetaminophen (NORCO) 7.5-325 MG tablet Take 1 tablet by mouth 3 (three) times daily as needed for severe pain (headaches).   . metoprolol succinate (TOPROL-XL) 100 MG 24 hr tablet TAKE 1 TABLET (100 MG TOTAL) BY MOUTH DAILY. TAKE WITH OR IMMEDIATELY FOLLOWING A MEAL.   Marland Kitchen omeprazole (PRILOSEC) 20 MG capsule Take 1 capsule (20 mg total) by mouth daily.   Marland Kitchen tiZANidine (ZANAFLEX) 4 MG tablet    . topiramate (TOPAMAX) 50 MG tablet Take 1.5 tablets by mouth daily.   Marland Kitchen zolpidem (AMBIEN) 10 MG tablet TAKE 1 TABLET BY MOUTH AT BEDTIME AS NEEDED FOR SLEEP   . SUMAtriptan (IMITREX) 100 MG tablet Take 1 tablet by mouth  every 2 hours as needed for migraine. Max 2 tablets in  24 hours. 10/20/2015: Received from: [facility]  . SUMAtriptan (IMITREX) 20 MG/ACT nasal spray Place 1 spray into the nose. 03/18/2015: Received from: [facility]  . venlafaxine XR (EFFEXOR-XR) 75 MG 24 hr capsule Take 225 mg by mouth. 03/18/2015: Received from: [facility]   No facility-administered encounter medications on file as of 05/12/2020.     Current Diagnosis/Assessment:  SDOH Interventions     Most Recent Value  SDOH Interventions  Financial Strain Interventions Other (Comment)  [may switch alprazolam to lorazepam for cost savings]     Goals Addressed            This Visit's Progress   .  Pharmacy Care Plan       CARE PLAN ENTRY  Current Barriers:  . Chronic Disease Management support, education, and care coordination needs related to Hypertension, Depression, and Anxiety   Hypertension BP Readings from Last 3 Encounters:  04/06/20 (!) 142/90  01/06/20 (!) 152/90  10/04/19 (!) 180/100 .  Pharmacist Clinical Goal(s): o Over the next 30 days, patient will work with PharmD and providers to achieve BP goal <130/80 . Current regimen:  o Amlodipine 5 mg daily  o Furosemide 20 mg daily  o Hydralazine 25 mg 3 times daily o Metoprolol  succinate 100 mg daily  . Interventions: o Discussed BP goals and importance of medication adherence o Discussed duration of action for hydralazine and importance of 3 times daily dosing . Patient self care activities - Over the next 30 days, patient will: o Check BP daily, document, and provide at future appointments o Ensure daily salt intake < 2300 mg/day o Take Hydralazine 3 times daily to avoid effects wearing off  Depression/Anxiety . Pharmacist Clinical Goal(s) o Over the next 30 days, patient will work with PharmD and providers to optimize therapy . Current regimen:  o Alprazolam 0.5 mg 3 times daily as needed o Venlafaxine XR 75 mg - 3 capsules once daily . Interventions: o Discussed maximum effective dose for venlafaxine is 225 mg/day o Discussed opportunity for adding bupropion 150 mg to target lack of motivation since it is least likely to cause weight gain o Recommend to switch alprazolam to lorazepam 1 mg for cost savings with insurance plan . Patient self care activities - Over the next 30 days, patient will: o Takes medications as prescribed o Follow up with PCP as needed  Medication management . Pharmacist Clinical Goal(s): o Over the next 30 days, patient will work with PharmD and providers to achieve optimal medication adherence . [facility] . Interventions o Comprehensive medication review performed. o Utilize [facility] for medication synchronization, packaging and delivery . Patient self care activities - Over the next 30 days, patient will: o Focus on medication adherence by pill pack o Take medications as prescribed o Report any questions or concerns to PharmD and/or provider(s)  Initial goal documentation       Hypertension   BP goal is:  <130/80  Office blood pressures are  BP Readings from Last 3 Encounters:  04/06/20 (!) 142/90  01/06/20 (!) 152/90  10/04/19 (!) 180/100   Kidney Function Lab Results  Component Value  Date/Time   CREATININE 1.43 (H) 04/06/2020 02:23 PM   CREATININE 1.20 01/06/2020 03:16 PM   GFR 36.33 (L) 04/06/2020 02:23 PM   GFRNONAA 48.80 03/10/2010 11:47 AM   GFRAA 73 12/10/2008 07:53 AM   Lab Results  Component Value Date/Time   K 3.8 04/06/2020 02:23 PM   K 3.3 (L) 01/06/2020 03:16 PM   Patient checks BP at home 1-2x per week  Patient home BP readings are ranging: 131/82   Patient has failed these meds in the past: Candesartan, lisinopril (cough), felodipine, verapamil Patient is currently controlled on the following medications:   Amlodipine 5 mg daily -HS  Furosemide 20 mg daily   Hydralazine 25 mg TID - once a day AM  Metoprolol XL 100 mg daily - AM  We discussed diet and exercise extensively; BP goals; med adherence; pt is only taking hydralazine once a day, counseled on duration of action and pt agreed to start taking as prescribed  Plan  Continue current medications and control  with diet and exercise   Depression / Anxiety   Depression screen [facility] 2/9 09/07/2018 05/09/2016 05/14/2014  Decreased Interest 2 0 0  Down, Depressed, Hopeless 2 0 0  PHQ - 2 Score 4 0 0  Altered sleeping 3 - -  Tired, decreased energy 2 - -  Change in appetite 0 - -  Feeling bad or failure about yourself  1 - -  Trouble concentrating 0 - -  Moving slowly or fidgety/restless 0 - -  Suicidal thoughts 0 - -  PHQ-9 Score 10 - -  Difficult doing work/chores Somewhat difficult - -   Patient has failed these meds in past: duloxetine, sertraline Patient is currently uncontrolled on the following medications:   Alprazolam 0.5 mg TID PRN - usually 1-2 times daily  Venlafaxine XR 75 mg - 3 caps AM  We discussed:  Pt reports her mood is fairly well controlled but over the past several months she has struggled with motivation - she is not motivated to partake in hobbies or even leave the house; she admits this even prevented her from seeking care for a suspected UTI for over 3 weeks. Pt  thinks this feeling is not related to COVID/social distancing.   Discussed maximum dose of venlafaxine is 225 mg/day - doses up to 375 mg have been used in literature but experience is limited. Furthermore pt has hypertension and I would not recommend increasing venlafaxine further due to BP risks.  Discussed opportunity for augmentation with bupropion; pt wants to avoid anything that will make her gain weight. She thinks she has been on this in the past but does not remember what happened, and it may have been monotherapy.  Also, pt reports alprazolam is $47/month. Per formulary it is a tier-3 drug. Lorazepam is a tier-1 drug with $5 copay. Pt is interested in switching benzo for cost savings. Lorazepam does have longer half life than alprazolam so BID dosing would likely suffice. Pt reports she usually takes alprazolam 1-2 times daily anyway.  Recommendations:  -Add bupropion 150 mg/day to address motivation issues -Switch alprazolam 0.5 mg TID to lorazepam 1 mg BID for cost savings  Insmonia   Patient has failed these meds in past: n/a Patient is currently controlled on the following medications:   Zolpidem 10 mg QHS PRN   We discussed:  Pt reports she uses this every night and cannot sleep without it.  Plan  Continue current medications   Migraine   Patient has failed these meds in past: Botox, trigger point injections, Aimovig Patient is currently controlled on the following medications:   Sumatriptan 100 mg   Sumatriptan 20 mg/act nasal spray   Tizanidine 4 mg PRN  Cyclobenzaprine 10 mg PRN  Hydrocodone-APAP 7.5-325 TID PRN Severe Headache Pain   Topiramate 50 mg 1.5 tabs daily   We discussed: Pt reports migraines since her teens. She believes her current regimen works as well as anything ever will, she does nto want to try anything else. When she has migraines shes uses sumatriptan first, then tizanidine if unresolved, then hydrocodone as last resort. She uses  cyclobenzaprine at night for sleep rather than for pain/migraines.   Plan  Continue current medications   GERD   Patient has failed these meds in past: n/a Patient is currently controlled on the following medications:  . Omeprazole 20 mg daily  We discussed:  Takes PRN  Plan  Continue current medications   Misc/OTC     Calcium+ Vit D '500mg'$ /200units 1  tab BID   We discussed:  Patient is satisfied with current OTC regimen and denies issues  Plan  Continue current medications   Medication Management   [facility] Uses pill box? Yes Pt endorses 100% compliance  We discussed: Verbal consent obtained for [facility] enhanced pharmacy services (medication synchronization, adherence packaging, delivery coordination). A medication sync plan was created to allow patient to get all medications delivered once every 30 to 90 days per patient preference. Patient understands they have freedom to choose pharmacy and clinical pharmacist will coordinate care between all prescribers and [facility].   Plan  Utilize [facility] for medication synchronization, packaging and delivery    Follow up: 1 month phone visit  Charlene Brooke, PharmD Clinical Pharmacist [facility] [phone]

## 2020-05-13 ENCOUNTER — Telehealth: Payer: Self-pay | Admitting: Internal Medicine

## 2020-05-13 MED ORDER — BUPROPION HCL ER (XL) 150 MG PO TB24: 150.0000 mg | ORAL_TABLET | Freq: Every day | ORAL | 5 refills | Status: DC

## 2020-05-13 NOTE — Telephone Encounter (Signed)
Called and left message for patient with info but to call back if she had any questions.

## 2020-05-13 NOTE — Telephone Encounter (Signed)
Spoke to pharmacist.  Please let patient know that.  Agree with changes she recommended.  Start Wellbutrin 150 mg daily in the morning.    Stop alprazolam and start lorazepam 1 mg twice daily, which we can start next month since she just got the alprazolam.  She should call us when she is due for that prescription so that we can make.Wellbutrin prescription sent to pharmacy.

## 2020-05-13 NOTE — Patient Instructions (Addendum)
Visit Information  Phone number for Pharmacist: [phone]  Thank you for meeting with me to discuss your medications! I look forward to working with you to achieve your health care goals. Below is a summary of what we talked about during the visit:  Goals Addressed            This [place]  Current Barriers:   Chronic Disease Management support, education, and care coordination needs related to Hypertension, Depression, and Anxiety   Hypertension BP Readings from Last 3 Encounters:  04/06/20 (!) 142/90  01/06/20 (!) 152/90  10/04/19 (!) 180/100   Pharmacist Clinical Goal(s): o Over the next 30 days, patient will work with PharmD and providers to achieve BP goal <130/80  Current regimen:  o Amlodipine 5 mg daily  o Furosemide 20 mg daily  o Hydralazine 25 mg 3 times daily o Metoprolol succinate 100 mg daily   Interventions: o Discussed BP goals and importance of medication adherence o Discussed duration of action for hydralazine and importance of 3 times daily dosing  Patient self care activities - Over the next 30 days, patient will: o Check BP daily, document, and provide at future appointments o Ensure daily salt intake < 2300 mg/day o Take Hydralazine 3 times daily to avoid effects wearing off  Depression/Anxiety  Pharmacist Clinical Goal(s) o Over the next 30 days, patient will work with PharmD and providers to optimize therapy  Current regimen:  o Alprazolam 0.5 mg 3 times daily as needed o Venlafaxine XR 75 mg - 3 capsules once daily  Interventions: o Discussed maximum effective dose for venlafaxine is 225 mg/day o Discussed opportunity for adding bupropion 150 mg to target lack of motivation since it is least likely to cause weight gain o Recommend to switch alprazolam to lorazepam 1 mg for cost savings with insurance plan  Patient self care activities - Over the next 30 days, patient  will: o Takes medications as prescribed o Follow up with PCP as needed  Medication management  Pharmacist Clinical Goal(s): o Over the next 30 days, patient will work with PharmD and providers to achieve optimal medication adherence  [facility]  Interventions o Comprehensive medication review performed. o Utilize [facility] for medication synchronization, packaging and delivery  Patient self care activities - Over the next 30 days, patient will: o Focus on medication adherence by pill pack o Take medications as prescribed o Report any questions or concerns to PharmD and/or provider(s)  Initial goal documentation       Ms. Bressman was given information about Chronic Care Management services today including:  1. CCM service includes personalized support from designated clinical staff supervised by her physician, including individualized plan of care and coordination with other care providers 2. 24/7 contact phone numbers for assistance for urgent and routine care needs. 3. Standard insurance, coinsurance, copays and deductibles apply for chronic care management only during months in which we provide at least 20 minutes of these services. Most insurances cover these services at 100%, however patients may be responsible for any copay, coinsurance and/or deductible if applicable. This service may help you avoid the need for more expensive face-to-face services. 4. Only one practitioner may furnish and bill the service in a calendar month. 5. The patient may stop CCM services at any time (effective at the end of the month) by phone call to the office staff.  Patient agreed  to services and verbal consent obtained.   The patient verbalized understanding of instructions provided today and agreed to receive a mailed copy of patient instruction and/or educational materials. Telephone follow up appointment with pharmacy team member scheduled for: 1 month  Charlene Brooke, PharmD Clinical Pharmacist [facility] at [facility] [phone]  Living With Depression Everyone experiences occasional disappointment, sadness, and loss in their lives. When you are feeling down, blue, or sad for at least 2 weeks in a row, it may mean that you have depression. Depression can affect your thoughts and feelings, relationships, daily activities, and physical health. It is caused by changes in the way your brain functions. If you receive a diagnosis of depression, your health care provider will tell you which type of depression you have and what treatment options are available to you. If you are living with depression, there are ways to help you recover from it and also ways to prevent it from coming back. How to cope with lifestyle changes Coping with stress     Stress is your bodys reaction to life changes and events, both good and bad. Stressful situations may include:  Getting married.  The death of a spouse.  Losing a job.  Retiring.  Having a baby. Stress can last just a few hours or it can be ongoing. Stress can play a major role in depression, so it is important to learn both how to cope with stress and how to think about it differently. Talk with your health care provider or a counselor if you would like to learn more about stress reduction. He or she may suggest some stress reduction techniques, such as:  Music therapy. This can include creating music or listening to music. Choose music that you enjoy and that inspires you.  Mindfulness-based meditation. This kind of meditation can be done while sitting or walking. It involves being aware of your normal breaths, rather than trying to control your breathing.  Centering prayer. This is a kind of meditation that involves focusing on a spiritual word or phrase. Choose a word, phrase, or sacred image that is meaningful to you and that brings you peace.  Deep breathing. To do this, expand  your stomach and inhale slowly through your nose. Hold your breath for 3-5 seconds, then exhale slowly, allowing your stomach muscles to relax.  Muscle relaxation. This involves intentionally tensing muscles then relaxing them. Choose a stress reduction technique that fits your lifestyle and personality. Stress reduction techniques take time and practice to develop. Set aside 5-15 minutes a day to do them. Therapists can offer training in these techniques. The training may be covered by some insurance plans. Other things you can do to manage stress include:  Keeping a stress diary. This can help you learn what triggers your stress and ways to control your response.  Understanding what your limits are and saying no to requests or events that lead to a schedule that is too full.  Thinking about how you respond to certain situations. You may not be able to control everything, but you can control how you react.  Adding humor to your life by watching funny films or TV shows.  Making time for activities that help you relax and not feeling guilty about spending your time this way.  Medicines Your health care provider may suggest certain medicines if he or she feels that they will help improve your condition. Avoid using alcohol and other substances that may prevent your medicines from  working properly (may interact). It is also important to:  Talk with your pharmacist or health care provider about all the medicines that you take, their possible side effects, and what medicines are safe to take together.  Make it your goal to take part in all treatment decisions (shared decision-making). This includes giving input on the side effects of medicines. It is best if shared decision-making with your health care provider is part of your total treatment plan. If your health care provider prescribes a medicine, you may not notice the full benefits of it for 4-8 weeks. Most people who are treated for depression  need to be on medicine for at least 6-12 months after they feel better. If you are taking medicines as part of your treatment, do not stop taking medicines without first talking to your health care provider. You may need to have the medicine slowly decreased (tapered) over time to decrease the risk of harmful side effects. Relationships Your health care provider may suggest family therapy along with individual therapy and drug therapy. While there may not be family problems that are causing you to feel depressed, it is still important to make sure your family learns as much as they can about your mental health. Having your familys support can help make your treatment successful. How to recognize changes in your condition Everyone has a different response to treatment for depression. Recovery from major depression happens when you have not had signs of major depression for two months. This may mean that you will start to:  Have more interest in doing activities.  Feel less hopeless than you did 2 months ago.  Have more energy.  Overeat less often, or have better or improving appetite.  Have better concentration. Your health care provider will work with you to decide the next steps in your recovery. It is also important to recognize when your condition is getting worse. Watch for these signs:  Having fatigue or low energy.  Eating too much or too little.  Sleeping too much or too little.  Feeling restless, agitated, or hopeless.  Having trouble concentrating or making decisions.  Having unexplained physical complaints.  Feeling irritable, angry, or aggressive. Get help as soon as you or your family members notice these symptoms coming back. How to get support and help from others How to talk with friends and family members about your condition  Talking to friends and family members about your condition can provide you with one way to get support and guidance. Reach out to trusted  friends or family members, explain your symptoms to them, and let them know that you are working with a health care provider to treat your depression. Financial resources Not all insurance plans cover mental health care, so it is important to check with your insurance carrier. If paying for co-pays or counseling services is a problem, search for a local or county mental health care center. They may be able to offer public mental health care services at low or no cost when you are not able to see a private health care provider. If you are taking medicine for depression, you may be able to get the generic form, which may be less expensive. Some makers of prescription medicines also offer help to patients who cannot afford the medicines they need. Follow these instructions at home:   Get the right amount and quality of sleep.  Cut down on using caffeine, tobacco, alcohol, and other potentially harmful substances.  Try to exercise, such  as walking or lifting small weights.  Take over-the-counter and prescription medicines only as told by your health care provider.  Eat a healthy diet that includes plenty of vegetables, fruits, whole grains, low-fat dairy products, and lean protein. Do not eat a lot of foods that are high in solid fats, added sugars, or salt.  Keep all follow-up visits as told by your health care provider. This is important. Contact a health care provider if:  You stop taking your antidepressant medicines, and you have any of these symptoms: ? Nausea. ? Headache. ? Feeling lightheaded. ? Chills and body aches. ? Not being able to sleep (insomnia).  You or your friends and family think your depression is getting worse. Get help right away if:  You have thoughts of hurting yourself or others. If you ever feel like you may hurt yourself or others, or have thoughts about taking your own life, get help right away. You can go to your nearest emergency department or call:  Your  local emergency services (911 in the [place]  A suicide crisis helpline, such as the [place] at (929) 249-3251. This is open 24-hours a day. Summary  If you are living with depression, there are ways to help you recover from it and also ways to prevent it from coming back.  Work with your health care team to create a management plan that includes counseling, stress management techniques, and healthy lifestyle habits. This information is not intended to replace advice given to you by your health care provider. Make sure you discuss any questions you have with your health care provider. Document Revised: 03/08/2019 Document Reviewed: 10/17/2016 Elsevier Patient Education  [place].

## 2020-05-14 NOTE — Telephone Encounter (Signed)
My-chart message sent to patient also with Dr. [name] recommendations.

## 2020-05-15 ENCOUNTER — Other Ambulatory Visit: Payer: Self-pay

## 2020-05-15 MED ORDER — METOPROLOL SUCCINATE ER 100 MG PO TB24: 100.0000 mg | ORAL_TABLET | Freq: Every day | ORAL | 3 refills | Status: DC

## 2020-05-15 MED ORDER — FUROSEMIDE 20 MG PO TABS: 20.0000 mg | ORAL_TABLET | Freq: Every day | ORAL | 2 refills | Status: DC

## 2020-05-15 MED ORDER — OMEPRAZOLE 20 MG PO CPDR: 20.0000 mg | DELAYED_RELEASE_CAPSULE | Freq: Every day | ORAL | 3 refills | Status: DC

## 2020-05-15 MED ORDER — HYDRALAZINE HCL 25 MG PO TABS: 25.0000 mg | ORAL_TABLET | Freq: Three times a day (TID) | ORAL | 5 refills | Status: DC

## 2020-05-15 MED ORDER — AMLODIPINE BESYLATE 5 MG PO TABS: 5.0000 mg | ORAL_TABLET | Freq: Every day | ORAL | 0 refills | Status: DC

## 2020-05-16 NOTE — Telephone Encounter (Signed)
Last read by Cathleen Fears Branscom at 12:56 PM on 05/14/2020.

## 2020-05-25 DIAGNOSIS — R69 Illness, unspecified: Secondary | ICD-10-CM | POA: Diagnosis not present

## 2020-05-28 MED ORDER — LORAZEPAM 1 MG PO TABS: 1.0000 mg | ORAL_TABLET | Freq: Two times a day (BID) | ORAL | 0 refills | Status: DC

## 2020-05-28 MED ORDER — ALPRAZOLAM 0.5 MG PO TABS: ORAL_TABLET | ORAL | 0 refills | Status: DC

## 2020-05-28 NOTE — Addendum Note (Signed)
Addended by: Binnie Rail on: 05/28/2020 08:40 PM   Modules accepted: Orders

## 2020-05-28 NOTE — Addendum Note (Signed)
Addended by: Binnie Rail on: 05/28/2020 08:38 PM   Modules accepted: Orders

## 2020-06-10 ENCOUNTER — Other Ambulatory Visit: Payer: Self-pay

## 2020-06-10 ENCOUNTER — Ambulatory Visit: Payer: Medicare HMO | Admitting: Pharmacist

## 2020-06-10 DIAGNOSIS — I10 Essential (primary) hypertension: Secondary | ICD-10-CM

## 2020-06-10 DIAGNOSIS — F419 Anxiety disorder, unspecified: Secondary | ICD-10-CM

## 2020-06-10 DIAGNOSIS — F3289 Other specified depressive episodes: Secondary | ICD-10-CM

## 2020-06-10 NOTE — Patient Instructions (Addendum)
Visit Information  Phone number for Pharmacist: [phone]  Goals Addressed            This Visit's Progress   . Pharmacy Care Plan       CARE PLAN ENTRY  Current Barriers:  . Chronic Disease Management support, education, and care coordination needs related to Hypertension, Depression, and Anxiety   Hypertension BP Readings from Last 3 Encounters:  04/06/20 (!) 142/90  01/06/20 (!) 152/90  10/04/19 (!) 180/100 .  Pharmacist Clinical Goal(s): o Over the next 30 days, patient will work with PharmD and providers to achieve BP goal <130/80 . Current regimen:  o Amlodipine 5 mg daily  o Furosemide 20 mg daily  o Hydralazine 25 mg 2 times daily o Metoprolol succinate 100 mg daily  . Interventions: o Discussed BP goals and importance of medication adherence . Patient self care activities - Over the next 30 days, patient will: o Check BP daily, document, and provide at future appointments o Ensure daily salt intake < 2300 mg/day  Depression/Anxiety . Pharmacist Clinical Goal(s) o Over the next 30 days, patient will work with PharmD and providers to optimize therapy . Current regimen:  o Lorazepam 1 mg twice daily as needed o Venlafaxine XR 75 mg - 3 capsules once daily o Bupropion XL 150 mg daily . Interventions: o Discussed maximum effective dose for venlafaxine is 225 mg/day o Switched alprazolam to lorazepam for cost savings o Recommended to increase bupropion to 2 tablets in the morning . Patient self care activities - Over the next 30 days, patient will: o Take medications as prescribed o Follow up with PCP as needed  Medication management . Pharmacist Clinical Goal(s): o Over the next 30 days, patient will work with PharmD and providers to achieve optimal medication adherence . Current pharmacy: [facility] . Interventions o Comprehensive medication review performed. o Utilize [facility] pharmacy for medication synchronization, packaging and delivery . Patient self  care activities - Over the next 30 days, patient will: o Focus on medication adherence by pill pack o Take medications as prescribed o Report any questions or concerns to PharmD and/or provider(s)  Please see past updates related to this goal by clicking on the "Past Updates" button in the selected goal       Patient verbalizes understanding of instructions provided today.   Telephone follow up appointment with pharmacy team member scheduled for: 1 month  Charlene Brooke, PharmD Clinical Pharmacist [facility] [phone]  Living With Depression Everyone experiences occasional disappointment, sadness, and loss in their lives. When you are feeling down, blue, or sad for at least 2 weeks in a row, it may mean that you have depression. Depression can affect your thoughts and feelings, relationships, daily activities, and physical health. It is caused by changes in the way your brain functions. If you receive a diagnosis of depression, your health care provider will tell you which type of depression you have and what treatment options are available to you. If you are living with depression, there are ways to help you recover from it and also ways to prevent it from coming back. How to cope with lifestyle changes Coping with stress     Stress is your body's reaction to life changes and events, both good and bad. Stressful situations may include:  Getting married.  The death of a spouse.  Losing a job.  Retiring.  Having a baby. Stress can last just a few hours or it can be ongoing.  Stress can play a major role in depression, so it is important to learn both how to cope with stress and how to think about it differently. Talk with your health care provider or a counselor if you would like to learn more about stress reduction. He or she may suggest some stress reduction techniques, such as:  Music therapy. This can include creating music or listening to  music. Choose music that you enjoy and that inspires you.  Mindfulness-based meditation. This kind of meditation can be done while sitting or walking. It involves being aware of your normal breaths, rather than trying to control your breathing.  Centering prayer. This is a kind of meditation that involves focusing on a spiritual word or phrase. Choose a word, phrase, or sacred image that is meaningful to you and that brings you peace.  Deep breathing. To do this, expand your stomach and inhale slowly through your nose. Hold your breath for 3-5 seconds, then exhale slowly, allowing your stomach muscles to relax.  Muscle relaxation. This involves intentionally tensing muscles then relaxing them. Choose a stress reduction technique that fits your lifestyle and personality. Stress reduction techniques take time and practice to develop. Set aside 5-15 minutes a day to do them. Therapists can offer training in these techniques. The training may be covered by some insurance plans. Other things you can do to manage stress include:  Keeping a stress diary. This can help you learn what triggers your stress and ways to control your response.  Understanding what your limits are and saying no to requests or events that lead to a schedule that is too full.  Thinking about how you respond to certain situations. You may not be able to control everything, but you can control how you react.  Adding humor to your life by watching funny films or TV shows.  Making time for activities that help you relax and not feeling guilty about spending your time this way.  Medicines Your health care provider may suggest certain medicines if he or she feels that they will help improve your condition. Avoid using alcohol and other substances that may prevent your medicines from working properly (may interact). It is also important to:  Talk with your pharmacist or health care provider about all the medicines that you take,  their possible side effects, and what medicines are safe to take together.  Make it your goal to take part in all treatment decisions (shared decision-making). This includes giving input on the side effects of medicines. It is best if shared decision-making with your health care provider is part of your total treatment plan. If your health care provider prescribes a medicine, you may not notice the full benefits of it for 4-8 weeks. Most people who are treated for depression need to be on medicine for at least 6-12 months after they feel better. If you are taking medicines as part of your treatment, do not stop taking medicines without first talking to your health care provider. You may need to have the medicine slowly decreased (tapered) over time to decrease the risk of harmful side effects. Relationships Your health care provider may suggest family therapy along with individual therapy and drug therapy. While there may not be family problems that are causing you to feel depressed, it is still important to make sure your family learns as much as they can about your mental health. Having your family's support can help make your treatment successful. How to recognize changes in your condition  Everyone has a different response to treatment for depression. Recovery from major depression happens when you have not had signs of major depression for two months. This may mean that you will start to:  Have more interest in doing activities.  Feel less hopeless than you did 2 months ago.  Have more energy.  Overeat less often, or have better or improving appetite.  Have better concentration. Your health care provider will work with you to decide the next steps in your recovery. It is also important to recognize when your condition is getting worse. Watch for these signs:  Having fatigue or low energy.  Eating too much or too little.  Sleeping too much or too little.  Feeling restless, agitated, or  hopeless.  Having trouble concentrating or making decisions.  Having unexplained physical complaints.  Feeling irritable, angry, or aggressive. Get help as soon as you or your family members notice these symptoms coming back. How to get support and help from others How to talk with friends and family members about your condition  Talking to friends and family members about your condition can provide you with one way to get support and guidance. Reach out to trusted friends or family members, explain your symptoms to them, and let them know that you are working with a health care provider to treat your depression. Financial resources Not all insurance plans cover mental health care, so it is important to check with your insurance carrier. If paying for co-pays or counseling services is a problem, search for a local or county mental health care center. They may be able to offer public mental health care services at low or no cost when you are not able to see a private health care provider. If you are taking medicine for depression, you may be able to get the generic form, which may be less expensive. Some makers of prescription medicines also offer help to patients who cannot afford the medicines they need. Follow these instructions at home:   Get the right amount and quality of sleep.  Cut down on using caffeine, tobacco, alcohol, and other potentially harmful substances.  Try to exercise, such as walking or lifting small weights.  Take over-the-counter and prescription medicines only as told by your health care provider.  Eat a healthy diet that includes plenty of vegetables, fruits, whole grains, low-fat dairy products, and lean protein. Do not eat a lot of foods that are high in solid fats, added sugars, or salt.  Keep all follow-up visits as told by your health care provider. This is important. Contact a health care provider if:  You stop taking your antidepressant medicines, and  you have any of these symptoms: ? Nausea. ? Headache. ? Feeling lightheaded. ? Chills and body aches. ? Not being able to sleep (insomnia).  You or your friends and family think your depression is getting worse. Get help right away if:  You have thoughts of hurting yourself or others. If you ever feel like you may hurt yourself or others, or have thoughts about taking your own life, get help right away. You can go to your nearest emergency department or call:  Your local emergency services (911 in the [place]  A suicide crisis helpline, such as the [facility] at 709-052-5366. This is open 24-hours a day. Summary  If you are living with depression, there are ways to help you recover from it and also ways to prevent it from coming back.  Work with your  health care team to create a management plan that includes counseling, stress management techniques, and healthy lifestyle habits. This information is not intended to replace advice given to you by your health care provider. Make sure you discuss any questions you have with your health care provider. Document Revised: 03/08/2019 Document Reviewed: 10/17/2016 Elsevier Patient Education  [place].

## 2020-06-10 NOTE — Chronic Care Management (AMB) (Signed)
Chronic Care Management Pharmacy  Name: Michaela Rios  MRN: [id number] DOB: 03-25-1951  Chief Complaint/ HPI  Michaela Rios,  69 y.o. , female presents for their Follow-Up CCM visit with the clinical pharmacist via telephone due to COVID-19 Pandemic.  PCP : Binnie Rail, MD  Their chronic conditions include: HTN, allergic rhinitis, GERD, migraine, IBS, Chronic pain, CKD, anxiety, depression, insomnia, pre-diabetes  Born and raised in [place], met her husband in the 64s at [place] - she worked in Engineer, site. Son is in [place] with his family, 3 grandchildren age 86-10. Has been able to visit a few times.    Office Visits: 04/06/20: Patient presented to Dr. [name] for pain management. Patient with urinary urgency, vaginal pain, increased frequency. UCx positive for K.Pneumonia. Patient started on Ciprofloxacin 250 mg x 10 days. Nasonex, potassium stopped.   01/06/20: Patient presented to Dr. [name] for pain management. BP in clinic 152/90. Home BP elevated, possibly due to migraines. Patient started on hydralazine 25 mg TID for HTN, omeprazole 20 mg for GERD.     Consult Visit: 01/23/20: Patient presented to Dr. Nicki Reaper (optometry) for presbyopia.   Medications: Outpatient Encounter Medications as of 06/10/2020  Medication Sig Note  . amLODipine (NORVASC) 5 MG tablet Take 1 tablet (5 mg total) by mouth daily.   Marland Kitchen buPROPion (WELLBUTRIN XL) 150 MG 24 hr tablet Take 1 tablet (150 mg total) by mouth daily.   . calcium-vitamin D (OSCAL 500/200 D-3) 500-200 MG-UNIT per tablet Take 1 tablet by mouth twice daily    . ciprofloxacin (CIPRO) 250 MG tablet Take 1 tablet (250 mg total) by mouth 2 (two) times daily.   . cyclobenzaprine (FLEXERIL) 10 MG tablet    . EPINEPHrine (EPIPEN 2-PAK) 0.3 mg/0.3 mL IJ SOAJ injection Inject 0.3 mLs (0.3 mg total) into the muscle as needed for anaphylaxis.   . furosemide (LASIX) 20 MG tablet Take 1 tablet (20 mg total) by mouth daily.   . hydrALAZINE  (APRESOLINE) 25 MG tablet Take 1 tablet (25 mg total) by mouth 3 (three) times daily. (Patient taking differently: Take 25 mg by mouth in the morning and at bedtime. )   . HYDROcodone-acetaminophen (NORCO) 7.5-325 MG tablet Take 1 tablet by mouth 3 (three) times daily as needed for severe pain (headaches).   . LORazepam (ATIVAN) 1 MG tablet Take 1 tablet (1 mg total) by mouth 2 (two) times daily.   . metoprolol succinate (TOPROL-XL) 100 MG 24 hr tablet Take 1 tablet (100 mg total) by mouth daily. Take with or immediately following a meal.   . omeprazole (PRILOSEC) 20 MG capsule Take 1 capsule (20 mg total) by mouth daily.   Marland Kitchen tiZANidine (ZANAFLEX) 4 MG tablet    . topiramate (TOPAMAX) 50 MG tablet Take 1.5 tablets by mouth daily.   Marland Kitchen zolpidem (AMBIEN) 10 MG tablet TAKE 1 TABLET BY MOUTH AT BEDTIME AS NEEDED FOR SLEEP   . SUMAtriptan (IMITREX) 100 MG tablet Take 1 tablet by mouth  every 2 hours as needed for migraine. Max 2 tablets in  24 hours. 10/20/2015: Received from: [facility]  . SUMAtriptan (IMITREX) 20 MG/ACT nasal spray Place 1 spray into the nose. 03/18/2015: Received from: [facility]  . venlafaxine XR (EFFEXOR-XR) 75 MG 24 hr capsule Take 225 mg by mouth. 03/18/2015: Received from: [facility]   No facility-administered encounter medications on file as of 06/10/2020.     Current Diagnosis/Assessment:  SDOH Interventions     Most Recent  Value  SDOH Interventions  Housing Interventions Intervention Not Indicated     Goals Addressed            This Visit's Progress   . Pharmacy Care Plan       CARE PLAN ENTRY  Current Barriers:  . Chronic Disease Management support, education, and care coordination needs related to Hypertension, Depression, and Anxiety   Hypertension BP Readings from Last 3 Encounters:  04/06/20 (!) 142/90  01/06/20 (!) 152/90  10/04/19 (!) 180/100 .  Pharmacist Clinical Goal(s): o Over the next 30 days, patient will work with PharmD and  providers to achieve BP goal <130/80 . Current regimen:  o Amlodipine 5 mg daily  o Furosemide 20 mg daily  o Hydralazine 25 mg 2 times daily o Metoprolol succinate 100 mg daily  . Interventions: o Discussed BP goals and importance of medication adherence . Patient self care activities - Over the next 30 days, patient will: o Check BP daily, document, and provide at future appointments o Ensure daily salt intake < 2300 mg/day  Depression/Anxiety . Pharmacist Clinical Goal(s) o Over the next 30 days, patient will work with PharmD and providers to optimize therapy . Current regimen:  o Lorazepam 1 mg twice daily as needed o Venlafaxine XR 75 mg - 3 capsules once daily o Bupropion XL 150 mg daily . Interventions: o Discussed maximum effective dose for venlafaxine is 225 mg/day o Switched alprazolam to lorazepam for cost savings o Recommended to increase bupropion to 2 tablets in the morning . Patient self care activities - Over the next 30 days, patient will: o Take medications as prescribed o Follow up with PCP as needed  Medication management . Pharmacist Clinical Goal(s): o Over the next 30 days, patient will work with PharmD and providers to achieve optimal medication adherence . Current pharmacy: [facility] . Interventions o Comprehensive medication review performed. o Utilize [facility] pharmacy for medication synchronization, packaging and delivery . Patient self care activities - Over the next 30 days, patient will: o Focus on medication adherence by pill pack o Take medications as prescribed o Report any questions or concerns to PharmD and/or provider(s)  Please see past updates related to this goal by clicking on the "Past Updates" button in the selected goal        Hypertension   BP goal is:  <130/80  Office blood pressures are  BP Readings from Last 3 Encounters:  04/06/20 (!) 142/90  01/06/20 (!) 152/90  10/04/19 (!) 180/100   Kidney Function Lab Results    Component Value Date/Time   CREATININE 1.43 (H) 04/06/2020 02:23 PM   CREATININE 1.20 01/06/2020 03:16 PM   GFR 36.33 (L) 04/06/2020 02:23 PM   GFRNONAA 48.80 03/10/2010 11:47 AM   GFRAA 73 12/10/2008 07:53 AM   K 3.8 04/06/2020 02:23 PM   K 3.3 (L) 01/06/2020 03:16 PM   Patient checks BP at home 1-2x per week  Patient home BP readings are ranging: 131/82   Patient has failed these meds in the past: Candesartan, lisinopril (cough), felodipine, verapamil Patient is currently controlled on the following medications:   Amlodipine 5 mg daily -HS  Furosemide 20 mg daily   Hydralazine 25 mg BID   Metoprolol XL 100 mg daily - AM  We discussed diet and exercise extensively; BP goals; med adherence; pt was previously taking hydralazine once daily, counseled her to take TID as prescribed; now pt reports she is taking it twice daily, she has been unable to  fit midday tablet into her daily routine but reports BP is well controlled with BID dosing.  Plan  Continue current medications and control with diet and exercise   Depression / Anxiety   Depression screen [facility] 2/9 09/07/2018 05/09/2016 05/14/2014  Decreased Interest 2 0 0  Down, Depressed, Hopeless 2 0 0  PHQ - 2 Score 4 0 0  Altered sleeping 3 - -  Tired, decreased energy 2 - -  Change in appetite 0 - -  Feeling bad or failure about yourself  1 - -  Trouble concentrating 0 - -  Moving slowly or fidgety/restless 0 - -  Suicidal thoughts 0 - -  PHQ-9 Score 10 - -  Difficult doing work/chores Somewhat difficult - -   Patient has failed these meds in past: duloxetine, sertraline Patient is currently uncontrolled on the following medications:   Lorazepam 1 mg BID prn  Venlafaxine XR 75 mg - 3 caps AM  Bupropion XL 150 mg daily  We discussed:  Pt reports her mood has not changed much since starting bupropion ~3 weeks ago. She reports she may have had worse headaches than usual, but it is very hard to tell because she has long  history of migraines and "if you look at my funny I get a headache". Pt would look to try higher dose of bupropion because she is very concerned about her lack of motivation and inability to leave the house most days - she recently cancelled a trip to visit her son due to this issue.   Recommendations:  Recommend increasing bupropion to 300 mg (150 mg x 2 tabs) F/U in 3-4 weeks to assess tolerability/efficacy  Insmonia   Patient has failed these meds in past: n/a Patient is currently controlled on the following medications:   Zolpidem 10 mg QHS PRN   We discussed:  Pt reports she uses this every night and cannot sleep without it.  Plan  Continue current medications  Migraine   Patient has failed these meds in past: Botox, trigger point injections, Aimovig Patient is currently controlled on the following medications:   Sumatriptan 100 mg   Sumatriptan 20 mg/act nasal spray   Tizanidine 4 mg PRN  Cyclobenzaprine 10 mg PRN  Hydrocodone-APAP 7.5-325 TID PRN Severe Headache Pain   Topiramate 50 mg 1.5 tabs daily   We discussed: Pt reports migraines since her teens. She believes her current regimen works as well as anything ever will, she does not want to try anything else. When she has migraines shes uses sumatriptan first, then tizanidine if unresolved, then hydrocodone as last resort. She uses cyclobenzaprine at night for sleep rather than for pain/migraines.   Plan  Continue current medications   Medication Management   Pt uses [facility] for all medications 90-day pill packs Pt endorses 100% compliance  We discussed: Reviewed patient's [facility] medication and Epic medication profile assuring there are no discrepancies or gaps in therapy. Confirmed all fill dates appropriate and verified with patient that there is a sufficient quantity of all prescribed medications at home. Informed patient to call me any time if needing medications before scheduled deliveries.  The anticipated medication sync date is 08/05/20.  Pt reports she very happy with [facility] delivery services.    Plan  Utilize [facility] pharmacy for medication synchronization, packaging and delivery    Follow up: 1 month phone visit  Charlene Brooke, PharmD Clinical Pharmacist [facility] at [facility] [phone]

## 2020-06-18 ENCOUNTER — Telehealth: Payer: Self-pay

## 2020-06-18 NOTE — Telephone Encounter (Deleted)
Error

## 2020-06-22 ENCOUNTER — Telehealth: Payer: Self-pay | Admitting: Pharmacist

## 2020-06-22 MED ORDER — ZOLPIDEM TARTRATE 10 MG PO TABS: ORAL_TABLET | ORAL | 1 refills | Status: DC

## 2020-06-22 NOTE — Progress Notes (Signed)
Subjective:    Patient ID: Michaela Rios, female    DOB: [birth year], 69 y.o.   MRN: [id number]  HPI The patient is here for follow up of their chronic medical problems, including pain management, migraines, htn, anxiety, depression, insomnia, GERD, prediabetes    She is taking all of her medications as prescribed.    Medications and allergies reviewed with patient and updated if appropriate.  Patient Active Problem List   Diagnosis Date Noted  . Urinary urgency 04/06/2020  . Left lumbar radiculopathy 06/04/2018  . CKD (chronic kidney disease) stage 3, GFR 30-59 ml/min 06/04/2018  . Pain management contract signed 06/03/2018  . Encounter for pain management 03/05/2018  . Memory difficulties 03/05/2018  . Hormone replacement therapy (HRT) 12/04/2017  . Sialadenitis, right parotid 06/24/2017  . Insomnia 05/12/2017  . Prediabetes 11/08/2016  . Vitamin D deficiency 06/08/2009  . Depression 01/07/2009  . PEPTIC ULCER DISEASE, CHRONIC 01/07/2009  . OTHER OBSTRUCTION OF DUODENUM 01/07/2009  . HIATAL HERNIA 01/07/2009  . Irritable bowel syndrome 01/07/2009  . DEGENERATIVE JOINT DISEASE 01/07/2009  . ANEMIA, HX OF 01/07/2009  . PULMONARY NODULE 06/09/2008  . Anxiety 04/01/2008  . Essential hypertension 04/01/2008  . Allergic rhinitis 04/01/2008  . GERD 04/01/2008  . LOW BACK PAIN SYNDROME 04/01/2008  . Chronic migraine 04/01/2008    Current Outpatient Medications on File Prior to Visit  Medication Sig Dispense Refill  . amLODipine (NORVASC) 5 MG tablet Take 1 tablet (5 mg total) by mouth daily. 90 tablet 0  . buPROPion (WELLBUTRIN XL) 150 MG 24 hr tablet Take 1 tablet (150 mg total) by mouth daily. 30 tablet 5  . calcium-vitamin D (OSCAL 500/200 D-3) 500-200 MG-UNIT per tablet Take 1 tablet by mouth twice daily     . ciprofloxacin (CIPRO) 250 MG tablet Take 1 tablet (250 mg total) by mouth 2 (two) times daily. 10 tablet 0  . cyclobenzaprine (FLEXERIL) 10 MG tablet     .  EPINEPHrine (EPIPEN 2-PAK) 0.3 mg/0.3 mL IJ SOAJ injection Inject 0.3 mLs (0.3 mg total) into the muscle as needed for anaphylaxis. 1 each 0  . furosemide (LASIX) 20 MG tablet Take 1 tablet (20 mg total) by mouth daily. 90 tablet 2  . hydrALAZINE (APRESOLINE) 25 MG tablet Take 1 tablet (25 mg total) by mouth 3 (three) times daily. (Patient taking differently: Take 25 mg by mouth in the morning and at bedtime. ) 60 tablet 5  . HYDROcodone-acetaminophen (NORCO) 7.5-325 MG tablet Take 1 tablet by mouth 3 (three) times daily as needed for severe pain (headaches). 15 tablet 0  . LORazepam (ATIVAN) 1 MG tablet Take 1 tablet (1 mg total) by mouth 2 (two) times daily. 60 tablet 0  . metoprolol succinate (TOPROL-XL) 100 MG 24 hr tablet Take 1 tablet (100 mg total) by mouth daily. Take with or immediately following a meal. 90 tablet 3  . omeprazole (PRILOSEC) 20 MG capsule Take 1 capsule (20 mg total) by mouth daily. 90 capsule 3  . SUMAtriptan (IMITREX) 100 MG tablet Take 1 tablet by mouth  every 2 hours as needed for migraine. Max 2 tablets in  24 hours.    . SUMAtriptan (IMITREX) 20 MG/ACT nasal spray Place 1 spray into the nose.    Marland Kitchen tiZANidine (ZANAFLEX) 4 MG tablet     . topiramate (TOPAMAX) 50 MG tablet Take 1.5 tablets by mouth daily.    Marland Kitchen venlafaxine XR (EFFEXOR-XR) 75 MG 24 hr capsule Take 225 mg by  mouth.    . zolpidem (AMBIEN) 10 MG tablet TAKE 1 TABLET BY MOUTH AT BEDTIME AS NEEDED FOR SLEEP 30 tablet 1   No current facility-administered medications on file prior to visit.    Past Medical History:  Diagnosis Date  . Anemia   . Anxiety   . Arthritis   . Cataract   . Chronic migraine    follows with neuro for same  . Clotting disorder (HCC)    clot in ovarian vein- hx  . Esophagitis   . GERD (gastroesophageal reflux disease)   . Hypertension   . IBS (irritable bowel syndrome)   . Low back pain syndrome   . MRSA (methicillin resistant Staphylococcus aureus) infection 04/17/12   left  torso  . Peptic ulcer disease 2003, 12/2012  . Peripheral vascular disease ([place])   . Vitamin D deficiency     Past Surgical History:  Procedure Laterality Date  . ABDOMINAL HYSTERECTOMY    . LAPAROSCOPIC BILATERAL SALPINGO OOPHERECTOMY    . pud surgery w/duod sticture-plasty and vagotomy  2003   Dr. Marlou Starks  . TONSILLECTOMY AND ADENOIDECTOMY     as a child  . TUBAL LIGATION  1984    Social History   Socioeconomic History  . Marital status: Married    Spouse name: Levada Dy  . Number of children: 1  . Years of education: Not on file  . Highest education level: Not on file  Occupational History  . Occupation: retired  Tobacco Use  . Smoking status: Never Smoker  . Smokeless tobacco: Never Used  Vaping Use  . Vaping Use: Never used  Substance and Sexual Activity  . Alcohol use: Yes    Comment: rare  . Drug use: No  . Sexual activity: Yes  Other Topics Concern  . Not on file  Social History Narrative   Lives with spouse    Grown son in [place], 2 g kids   Social Determinants of Health   Financial Resource Strain: Medium Risk  . Difficulty of Paying Living Expenses: Somewhat hard  Food Insecurity:   . Worried About Charity fundraiser in the Last Year:   . Arboriculturist in the Last Year:   Transportation Needs:   . Film/video editor (Medical):   Marland Kitchen Lack of Transportation (Non-Medical):   Physical Activity:   . Days of Exercise per Week:   . Minutes of Exercise per Session:   Stress:   . Feeling of Stress :   Social Connections:   . Frequency of Communication with Friends and Family:   . Frequency of Social Gatherings with Friends and Family:   . Attends Religious Services:   . Active Member of Clubs or Organizations:   . Attends Archivist Meetings:   Marland Kitchen Marital Status:     Family History  Problem Relation Age of Onset  . Aneurysm Father   . Prostate cancer Brother   . Lung cancer Brother   . Colon cancer Neg Hx   . Esophageal cancer Neg Hx   .  Rectal cancer Neg Hx   . Stomach cancer Neg Hx     Review of Systems     Objective:  There were no vitals filed for this visit. BP Readings from Last 3 Encounters:  04/06/20 (!) 142/90  01/06/20 (!) 152/90  10/04/19 (!) 180/100   Wt Readings from Last 3 Encounters:  04/06/20 153 lb (69.4 kg)  01/06/20 155 lb 9.6 oz (70.6 kg)  10/04/19  153 lb (69.4 kg)   There is no height or weight on file to calculate BMI.   Physical Exam    Constitutional: Appears well-developed and well-nourished. No distress.  HENT:  Head: Normocephalic and atraumatic.  Neck: Neck supple. No tracheal deviation present. No thyromegaly present.  No cervical lymphadenopathy Cardiovascular: Normal rate, regular rhythm and normal heart sounds.   No murmur heard. No carotid bruit .  No edema Pulmonary/Chest: Effort normal and breath sounds normal. No respiratory distress. No has no wheezes. No rales.  Skin: Skin is warm and dry. Not diaphoretic.  Psychiatric: Normal mood and affect. Behavior is normal.      Assessment & Plan:    See Problem List for Assessment and Plan of chronic medical problems.    This visit occurred during the SARS-CoV-2 public health emergency.  Safety protocols were in place, including screening questions prior to the visit, additional usage of staff PPE, and extensive cleaning of exam room while observing appropriate contact time as indicated for disinfecting solutions.    This encounter was created in error - please disregard.

## 2020-06-22 NOTE — Addendum Note (Signed)
Addended by: Binnie Rail on: 06/22/2020 06:42 PM   Modules accepted: Orders

## 2020-06-22 NOTE — Progress Notes (Signed)
Received call from patient requesting refill for zolpidem 10 mg be sent to [facility] for delivery. It was last prescribed 04/21/20 - 30 day supply with 1 refill. Will coordinate with PCP for refill.

## 2020-06-23 ENCOUNTER — Encounter: Payer: Medicare HMO | Admitting: Internal Medicine

## 2020-06-29 NOTE — Progress Notes (Signed)
Subjective:    Patient ID: Michaela Rios, female    DOB: [birth year], 69 y.o.   MRN: [id number]  HPI The patient is here for an acute visit.   Rash on hand/arm:  It started one week ago.  It was initially on one finger and has spread up her right arm.  It does not itch.  Her hand and arm hurts and it hurts in her upper right chest and neck but there no rash.  No itching.  Tingling in fingers.     Left breast rash  - under rash.  It started one month.  It initally itched.  She has hydrocortisone and it has helped.  Now it does not itch or has pain.     Medications and allergies reviewed with patient and updated if appropriate.  Patient Active Problem List   Diagnosis Date Noted  . Urinary urgency 04/06/2020  . Left lumbar radiculopathy 06/04/2018  . CKD (chronic kidney disease) stage 3, GFR 30-59 ml/min 06/04/2018  . Pain management contract signed 06/03/2018  . Encounter for pain management 03/05/2018  . Memory difficulties 03/05/2018  . Hormone replacement therapy (HRT) 12/04/2017  . Sialadenitis, right parotid 06/24/2017  . Insomnia 05/12/2017  . Prediabetes 11/08/2016  . Vitamin D deficiency 06/08/2009  . Depression 01/07/2009  . PEPTIC ULCER DISEASE, CHRONIC 01/07/2009  . OTHER OBSTRUCTION OF DUODENUM 01/07/2009  . HIATAL HERNIA 01/07/2009  . Irritable bowel syndrome 01/07/2009  . DEGENERATIVE JOINT DISEASE 01/07/2009  . ANEMIA, HX OF 01/07/2009  . PULMONARY NODULE 06/09/2008  . Anxiety 04/01/2008  . Essential hypertension 04/01/2008  . Allergic rhinitis 04/01/2008  . GERD 04/01/2008  . LOW BACK PAIN SYNDROME 04/01/2008  . Chronic migraine 04/01/2008    Current Outpatient Medications on File Prior to Visit  Medication Sig Dispense Refill  . amLODipine (NORVASC) 5 MG tablet Take 1 tablet (5 mg total) by mouth daily. 90 tablet 0  . buPROPion (WELLBUTRIN XL) 150 MG 24 hr tablet Take 1 tablet (150 mg total) by mouth daily. 30 tablet 5  . calcium-vitamin D  (OSCAL 500/200 D-3) 500-200 MG-UNIT per tablet Take 1 tablet by mouth twice daily     . cyclobenzaprine (FLEXERIL) 10 MG tablet     . EPINEPHrine (EPIPEN 2-PAK) 0.3 mg/0.3 mL IJ SOAJ injection Inject 0.3 mLs (0.3 mg total) into the muscle as needed for anaphylaxis. 1 each 0  . furosemide (LASIX) 20 MG tablet Take 1 tablet (20 mg total) by mouth daily. 90 tablet 2  . hydrALAZINE (APRESOLINE) 25 MG tablet Take 1 tablet (25 mg total) by mouth 3 (three) times daily. (Patient taking differently: Take 25 mg by mouth in the morning and at bedtime. ) 60 tablet 5  . HYDROcodone-acetaminophen (NORCO) 7.5-325 MG tablet Take 1 tablet by mouth 3 (three) times daily as needed for severe pain (headaches). 15 tablet 0  . LORazepam (ATIVAN) 1 MG tablet Take 1 tablet (1 mg total) by mouth 2 (two) times daily. 60 tablet 0  . metoprolol succinate (TOPROL-XL) 100 MG 24 hr tablet Take 1 tablet (100 mg total) by mouth daily. Take with or immediately following a meal. 90 tablet 3  . omeprazole (PRILOSEC) 20 MG capsule Take 1 capsule (20 mg total) by mouth daily. 90 capsule 3  . tiZANidine (ZANAFLEX) 4 MG tablet     . topiramate (TOPAMAX) 50 MG tablet Take 1.5 tablets by mouth daily.    Marland Kitchen zolpidem (AMBIEN) 10 MG tablet TAKE 1 TABLET BY MOUTH  AT BEDTIME AS NEEDED FOR SLEEP 30 tablet 1  . SUMAtriptan (IMITREX) 100 MG tablet Take 1 tablet by mouth  every 2 hours as needed for migraine. Max 2 tablets in  24 hours.    . SUMAtriptan (IMITREX) 20 MG/ACT nasal spray Place 1 spray into the nose.    . venlafaxine XR (EFFEXOR-XR) 75 MG 24 hr capsule Take 225 mg by mouth.     No current facility-administered medications on file prior to visit.    Past Medical History:  Diagnosis Date  . Anemia   . Anxiety   . Arthritis   . Cataract   . Chronic migraine    follows with neuro for same  . Clotting disorder (HCC)    clot in ovarian vein- hx  . Esophagitis   . GERD (gastroesophageal reflux disease)   . Hypertension   . IBS  (irritable bowel syndrome)   . Low back pain syndrome   . MRSA (methicillin resistant Staphylococcus aureus) infection 04/17/12   left torso  . Peptic ulcer disease 2003, 12/2012  . Peripheral vascular disease ([place])   . Vitamin D deficiency     Past Surgical History:  Procedure Laterality Date  . ABDOMINAL HYSTERECTOMY    . LAPAROSCOPIC BILATERAL SALPINGO OOPHERECTOMY    . pud surgery w/duod sticture-plasty and vagotomy  2003   Dr. Marlou Starks  . TONSILLECTOMY AND ADENOIDECTOMY     as a child  . TUBAL LIGATION  1984    Social History   Socioeconomic History  . Marital status: Married    Spouse name: Levada Dy  . Number of children: 1  . Years of education: Not on file  . Highest education level: Not on file  Occupational History  . Occupation: retired  Tobacco Use  . Smoking status: Never Smoker  . Smokeless tobacco: Never Used  Vaping Use  . Vaping Use: Never used  Substance and Sexual Activity  . Alcohol use: Yes    Comment: rare  . Drug use: No  . Sexual activity: Yes  Other Topics Concern  . Not on file  Social History Narrative   Lives with spouse    Grown son in [place], 2 g kids   Social Determinants of Health   Financial Resource Strain: Medium Risk  . Difficulty of Paying Living Expenses: Somewhat hard  Food Insecurity:   . Worried About Charity fundraiser in the Last Year:   . Arboriculturist in the Last Year:   Transportation Needs:   . Film/video editor (Medical):   Marland Kitchen Lack of Transportation (Non-Medical):   Physical Activity:   . Days of Exercise per Week:   . Minutes of Exercise per Session:   Stress:   . Feeling of Stress :   Social Connections:   . Frequency of Communication with Friends and Family:   . Frequency of Social Gatherings with Friends and Family:   . Attends Religious Services:   . Active Member of Clubs or Organizations:   . Attends Archivist Meetings:   Marland Kitchen Marital Status:     Family History  Problem Relation Age of  Onset  . Aneurysm Father   . Prostate cancer Brother   . Lung cancer Brother   . Colon cancer Neg Hx   . Esophageal cancer Neg Hx   . Rectal cancer Neg Hx   . Stomach cancer Neg Hx     Review of Systems  Constitutional: Positive for fatigue. Negative for fever.  Skin: Positive for rash.  Neurological: Positive for numbness and headaches.       Objective:   Vitals:   06/30/20 1508  BP: (!) 148/88  Pulse: 100  Temp: 98 F (36.7 C)  SpO2: 95%   BP Readings from Last 3 Encounters:  06/30/20 (!) 148/88  04/06/20 (!) 142/90  01/06/20 (!) 152/90   Wt Readings from Last 3 Encounters:  06/30/20 147 lb (66.7 kg)  04/06/20 153 lb (69.4 kg)  01/06/20 155 lb 9.6 oz (70.6 kg)   Body mass index is 26.04 kg/m.   Physical Exam Constitutional:      General: She is not in acute distress.    Appearance: Normal appearance. She is not ill-appearing.  HENT:     Head: Normocephalic and atraumatic.  Skin:    Findings: Rash present.     Comments: Orange sized erythematous rash under left breast w/o satellite lesions/blisters.  Clusters of blisters/papules right hand to distal lower arm. No rash R shoulder, neck.  A cluster of excoriated papules in central chest  Neurological:     Mental Status: She is alert.            Assessment & Plan:    See Problem List for Assessment and Plan of chronic medical problems.    This visit occurred during the SARS-CoV-2 public health emergency.  Safety protocols were in place, including screening questions prior to the visit, additional usage of staff PPE, and extensive cleaning of exam room while observing appropriate contact time as indicated for disinfecting solutions.

## 2020-06-30 ENCOUNTER — Ambulatory Visit (INDEPENDENT_AMBULATORY_CARE_PROVIDER_SITE_OTHER): Payer: Medicare HMO | Admitting: Internal Medicine

## 2020-06-30 ENCOUNTER — Other Ambulatory Visit: Payer: Self-pay

## 2020-06-30 ENCOUNTER — Encounter: Payer: Self-pay | Admitting: Internal Medicine

## 2020-06-30 VITALS — BP 148/88 | HR 100 | Temp 98.0°F | Ht 63.0 in | Wt 147.0 lb

## 2020-06-30 DIAGNOSIS — B029 Zoster without complications: Secondary | ICD-10-CM | POA: Diagnosis not present

## 2020-06-30 DIAGNOSIS — B354 Tinea corporis: Secondary | ICD-10-CM | POA: Diagnosis not present

## 2020-06-30 DIAGNOSIS — B0229 Other postherpetic nervous system involvement: Secondary | ICD-10-CM | POA: Insufficient documentation

## 2020-06-30 MED ORDER — VALACYCLOVIR HCL 1 G PO TABS: 1000.0000 mg | ORAL_TABLET | Freq: Three times a day (TID) | ORAL | 0 refills | Status: DC

## 2020-06-30 MED ORDER — CICLOPIROX OLAMINE 0.77 % EX CREA: TOPICAL_CREAM | Freq: Two times a day (BID) | CUTANEOUS | 0 refills | Status: DC

## 2020-06-30 NOTE — Assessment & Plan Note (Signed)
Acute Rash and symptoms c/w shingles  Start valtex Pain currently controlled with tylenol - continue Call if pain is not controlled

## 2020-06-30 NOTE — Assessment & Plan Note (Signed)
Acute Under left breast Likely tinea Start ciclopirox BID

## 2020-06-30 NOTE — Patient Instructions (Addendum)
Take valtrex three times a day for one week.    Use ciclopirox twice daily for your rash under your left breast - use it for a few days after rash goes away.     Please call if there is no improvement in your symptoms.      Shingles  Shingles, which is also known as herpes zoster, is an infection that causes a painful skin rash and fluid-filled blisters. It is caused by a virus. Shingles only develops in people who:  Have had chickenpox.  Have been given a medicine to protect against chickenpox (have been vaccinated). Shingles is rare in this group. What are the causes? Shingles is caused by varicella-zoster virus (VZV). This is the same virus that causes chickenpox. After a person is exposed to VZV, the virus stays in the body in an inactive (dormant) state. Shingles develops if the virus is reactivated. This can happen many years after the first (initial) exposure to VZV. It is not known what causes this virus to be reactivated. What increases the risk? People who have had chickenpox or received the chickenpox vaccine are at risk for shingles. Shingles infection is more common in people who:  Are older than age 52.  Have a weakened disease-fighting system (immune system), such as people with: ? HIV. ? AIDS. ? Cancer.  Are taking medicines that weaken the immune system, such as transplant medicines.  Are experiencing a lot of stress. What are the signs or symptoms? Early symptoms of this condition include itching, tingling, and pain in an area on your skin. Pain may be described as burning, stabbing, or throbbing. A few days or weeks after early symptoms start, a painful red rash appears. The rash is usually on one side of the body and has a band-like or belt-like pattern. The rash eventually turns into fluid-filled blisters that break open, change into scabs, and dry up in about 2-3 weeks. At any time during the infection, you may also develop:  A fever.  Chills.  A  headache.  An upset stomach. How is this diagnosed? This condition is diagnosed with a skin exam. Skin or fluid samples may be taken from the blisters before a diagnosis is made. These samples are examined under a microscope or sent to a lab for testing. How is this treated? The rash may last for several weeks. There is not a specific cure for this condition. Your health care provider will probably prescribe medicines to help you manage pain, recover more quickly, and avoid long-term problems. Medicines may include:  Antiviral drugs.  Anti-inflammatory drugs.  Pain medicines.  Anti-itching medicines (antihistamines). If the area involved is on your face, you may be referred to a specialist, such as an eye doctor (ophthalmologist) or an ear, nose, and throat (ENT) doctor (otolaryngologist) to help you avoid eye problems, chronic pain, or disability. Follow these instructions at home: Medicines  Take over-the-counter and prescription medicines only as told by your health care provider.  Apply an anti-itch cream or numbing cream to the affected area as told by your health care provider. Relieving itching and discomfort   Apply cold, wet cloths (cold compresses) to the area of the rash or blisters as told by your health care provider.  Cool baths can be soothing. Try adding baking soda or dry oatmeal to the water to reduce itching. Do not bathe in hot water. Blister and rash care  Keep your rash covered with a loose bandage (dressing). Wear loose-fitting clothing to help  ease the pain of material rubbing against the rash.  Keep your rash and blisters clean by washing the area with mild soap and cool water as told by your health care provider.  Check your rash every day for signs of infection. Check for: ? More redness, swelling, or pain. ? Fluid or blood. ? Warmth. ? Pus or a bad smell.  Do not scratch your rash or pick at your blisters. To help avoid scratching: ? Keep your  fingernails clean and cut short. ? Wear gloves or mittens while you sleep, if scratching is a problem. General instructions  Rest as told by your health care provider.  Keep all follow-up visits as told by your health care provider. This is important.  Wash your hands often with soap and water. If soap and water are not available, use hand sanitizer. Doing this lowers your chance of getting a bacterial skin infection.  Before your blisters change into scabs, your shingles infection can cause chickenpox in people who have never had it or have never been vaccinated against it. To prevent this from happening, avoid contact with other people, especially: ? Babies. ? Pregnant women. ? Children who have eczema. ? Elderly people who have transplants. ? People who have chronic illnesses, such as cancer or AIDS. Contact a health care provider if:  Your pain is not relieved with prescribed medicines.  Your pain does not get better after the rash heals.  You have signs of infection in the rash area, such as: ? More redness, swelling, or pain around the rash. ? Fluid or blood coming from the rash. ? The rash area feeling warm to the touch. ? Pus or a bad smell coming from the rash. Get help right away if:  The rash is on your face or nose.  You have facial pain, pain around your eye area, or loss of feeling on one side of your face.  You have difficulty seeing.  You have ear pain or have ringing in your ear.  You have a loss of taste.  Your condition gets worse. Summary  Shingles, which is also known as herpes zoster, is an infection that causes a painful skin rash and fluid-filled blisters.  This condition is diagnosed with a skin exam. Skin or fluid samples may be taken from the blisters and examined before the diagnosis is made.  Keep your rash covered with a loose bandage (dressing). Wear loose-fitting clothing to help ease the pain of material rubbing against the  rash.  Before your blisters change into scabs, your shingles infection can cause chickenpox in people who have never had it or have never been vaccinated against it. This information is not intended to replace advice given to you by your health care provider. Make sure you discuss any questions you have with your health care provider. Document Revised: 03/08/2019 Document Reviewed: 07/19/2017 Elsevier Patient Education  2020 [employer].

## 2020-07-02 ENCOUNTER — Telehealth: Payer: Self-pay | Admitting: Pharmacist

## 2020-07-02 MED ORDER — CYCLOBENZAPRINE HCL 10 MG PO TABS: 10.0000 mg | ORAL_TABLET | Freq: Three times a day (TID) | ORAL | 0 refills | Status: DC | PRN

## 2020-07-02 MED ORDER — HYDROCODONE-ACETAMINOPHEN 7.5-325 MG PO TABS: 1.0000 | ORAL_TABLET | Freq: Three times a day (TID) | ORAL | 0 refills | Status: DC | PRN

## 2020-07-02 NOTE — Addendum Note (Signed)
Addended by: Binnie Rail on: 07/02/2020 06:23 PM   Modules accepted: Orders

## 2020-07-02 NOTE — Progress Notes (Signed)
Received call from patient, she is in pain due to shingles infection and she is requesting refills for:  Hydrocodone-APAP 7.5-325 mg Cyclobenzaprine 10 mg  Preferred pharmacy: [facility]  Will inform PCP of request.

## 2020-07-03 ENCOUNTER — Ambulatory Visit: Payer: Medicare HMO | Admitting: Internal Medicine

## 2020-07-09 ENCOUNTER — Ambulatory Visit: Payer: Medicare HMO | Admitting: Pharmacist

## 2020-07-09 ENCOUNTER — Other Ambulatory Visit: Payer: Self-pay

## 2020-07-09 DIAGNOSIS — I10 Essential (primary) hypertension: Secondary | ICD-10-CM

## 2020-07-09 DIAGNOSIS — F3289 Other specified depressive episodes: Secondary | ICD-10-CM

## 2020-07-09 DIAGNOSIS — F419 Anxiety disorder, unspecified: Secondary | ICD-10-CM

## 2020-07-09 NOTE — Patient Instructions (Addendum)
Visit Information  Phone number for Pharmacist: [phone]  Goals Addressed            This Visit's Progress   . Pharmacy Care Plan   On track    CARE PLAN ENTRY  Current Barriers:  . Chronic Disease Management support, education, and care coordination needs related to Hypertension, Depression, and Anxiety   Hypertension BP Readings from Last 3 Encounters:  04/06/20 (!) 142/90  01/06/20 (!) 152/90  10/04/19 (!) 180/100 .  Pharmacist Clinical Goal(s): o Over the next 30 days, patient will work with PharmD and providers to achieve BP goal <130/80 . Current regimen:  o Amlodipine 5 mg daily  o Furosemide 20 mg daily  o Hydralazine 25 mg 2 times daily o Metoprolol succinate 100 mg daily  . Interventions: o Discussed BP goals and importance of medication adherence . Patient self care activities - Over the next 30 days, patient will: o Check BP daily, document, and provide at future appointments o Ensure daily salt intake < 2300 mg/day  Depression/Anxiety . Pharmacist Clinical Goal(s) o Over the next 30 days, patient will work with PharmD and providers to optimize therapy . Current regimen:  o Lorazepam 1 mg twice daily as needed o Venlafaxine XR 75 mg - 3 capsules once daily o Bupropion XL 150 mg daily - 2 tablets daily . Interventions: o Discussed maximum effective dose for venlafaxine is 225 mg/day o Switched alprazolam to lorazepam for cost savings o Recommended to switch bupropion to 300 mg since patient has experienced improvement in symptoms on higher dose . Patient self care activities - Over the next 30 days, patient will: o Take medications as prescribed o Follow up with PCP as needed  Medication management . Pharmacist Clinical Goal(s): o Over the next 30 days, patient will work with PharmD and providers to achieve optimal medication adherence . Current pharmacy: [facility] . Interventions o Comprehensive medication review performed. o Utilize [facility]  pharmacy for medication synchronization, packaging and delivery . Patient self care activities - Over the next 30 days, patient will: o Focus on medication adherence by pill pack o Take medications as prescribed o Report any questions or concerns to PharmD and/or provider(s)  Please see past updates related to this goal by clicking on the "Past Updates" button in the selected goal       Patient verbalizes understanding of instructions provided today.   Telephone follow up appointment with pharmacy team member scheduled for: 1 month  Charlene Brooke, PharmD Clinical Pharmacist [facility] at [facility] [phone] Zoster Vaccine, Recombinant injection What is this medicine? ZOSTER VACCINE (ZOS ter vak SEEN) is used to prevent shingles in adults 70 years old and over. This vaccine is not used to treat shingles or nerve pain from shingles. This medicine may be used for other purposes; ask your health care provider or pharmacist if you have questions. COMMON BRAND NAME(S): [facility] What should I tell my health care provider before I take this medicine? They need to know if you have any of these conditions:  blood disorders or disease  cancer like leukemia or lymphoma  immune system problems or therapy  an unusual or allergic reaction to vaccines, other medications, foods, dyes, or preservatives  pregnant or trying to get pregnant  breast-feeding How should I use this medicine? This vaccine is for injection in a muscle. It is given by a health care professional. Talk to your pediatrician regarding the use of this medicine in children. This medicine is  not approved for use in children. Overdosage: If you think you have taken too much of this medicine contact a poison control center or emergency room at once. NOTE: This medicine is only for you. Do not share this medicine with others. What if I miss a dose? Keep appointments for follow-up (booster) doses as directed.  It is important not to miss your dose. Call your doctor or health care professional if you are unable to keep an appointment. What may interact with this medicine?  medicines that suppress your immune system  medicines to treat cancer  steroid medicines like prednisone or cortisone This list may not describe all possible interactions. Give your health care provider a list of all the medicines, herbs, non-prescription drugs, or dietary supplements you use. Also tell them if you smoke, drink alcohol, or use illegal drugs. Some items may interact with your medicine. What should I watch for while using this medicine? Visit your doctor for regular check ups. This vaccine, like all vaccines, may not fully protect everyone. What side effects may I notice from receiving this medicine? Side effects that you should report to your doctor or health care professional as soon as possible:  allergic reactions like skin rash, itching or hives, swelling of the face, lips, or tongue  breathing problems Side effects that usually do not require medical attention (report these to your doctor or health care professional if they continue or are bothersome):  chills  headache  fever  nausea, vomiting  redness, warmth, pain, swelling or itching at site where injected  tiredness This list may not describe all possible side effects. Call your doctor for medical advice about side effects. You may report side effects to FDA at 1-800-FDA-1088. Where should I keep my medicine? This vaccine is only given in a clinic, pharmacy, doctor's office, or other health care setting and will not be stored at home. NOTE: This sheet is a summary. It may not cover all possible information. If you have questions about this medicine, talk to your doctor, pharmacist, or health care provider.  2020 Elsevier/Gold Standard (2017-06-26 13:20:30)

## 2020-07-09 NOTE — Chronic Care Management (AMB) (Signed)
Chronic Care Management Pharmacy  Name: Michaela Rios  MRN: [id number] DOB: 1951/06/12  Chief Complaint/ HPI  Michaela Rios,  69 y.o. , female presents for their Follow-Up CCM visit with the clinical pharmacist via telephone due to COVID-19 Pandemic.  PCP : Binnie Rail, MD  Their chronic conditions include: HTN, allergic rhinitis, GERD, migraine, IBS, Chronic pain, CKD, anxiety, depression, insomnia, pre-diabetes  Born and raised in [place], met her husband in the 41s at [place] - she worked in Engineer, site. Son is in [place] with his family, 3 grandchildren age 63-10. Has been able to visit a few times.    Office Visits: 06/30/20 Dr [name] OV: acute visit for rash on hand/arm - started 1 week ago. Rash on L breast - 1 month ago. Consistent with shingles, start Valtrex, ciclopirox BID  06/23/20 Dr [name] OV: pain management  04/06/20: Patient presented to Dr. [name] for pain management. Patient with urinary urgency, vaginal pain, increased frequency. UCx positive for K.Pneumonia. Patient started on Ciprofloxacin 250 mg x 10 days. Nasonex, potassium stopped.   01/06/20: Patient presented to Dr. [name] for pain management. BP in clinic 152/90. Home BP elevated, possibly due to migraines. Patient started on hydralazine 25 mg TID for HTN, omeprazole 20 mg for GERD.     Consult Visit: 01/23/20: Patient presented to Dr. Nicki Reaper (optometry) for presbyopia.   Medications: Outpatient Encounter Medications as of 07/09/2020  Medication Sig Note  . amLODipine (NORVASC) 5 MG tablet Take 1 tablet (5 mg total) by mouth daily.   Marland Kitchen buPROPion (WELLBUTRIN XL) 150 MG 24 hr tablet Take 1 tablet (150 mg total) by mouth daily.   . calcium-vitamin D (OSCAL 500/200 D-3) 500-200 MG-UNIT per tablet Take 1 tablet by mouth twice daily    . ciclopirox (LOPROX) 0.77 % cream Apply topically 2 (two) times daily.   . cyclobenzaprine (FLEXERIL) 10 MG tablet Take 1 tablet (10 mg total) by mouth 3 (three) times daily as  needed for muscle spasms.   Marland Kitchen EPINEPHrine (EPIPEN 2-PAK) 0.3 mg/0.3 mL IJ SOAJ injection Inject 0.3 mLs (0.3 mg total) into the muscle as needed for anaphylaxis.   . furosemide (LASIX) 20 MG tablet Take 1 tablet (20 mg total) by mouth daily.   . hydrALAZINE (APRESOLINE) 25 MG tablet Take 1 tablet (25 mg total) by mouth 3 (three) times daily. (Patient taking differently: Take 25 mg by mouth in the morning and at bedtime. )   . HYDROcodone-acetaminophen (NORCO) 7.5-325 MG tablet Take 1 tablet by mouth 3 (three) times daily as needed for severe pain (headaches).   . LORazepam (ATIVAN) 1 MG tablet Take 1 tablet (1 mg total) by mouth 2 (two) times daily.   . metoprolol succinate (TOPROL-XL) 100 MG 24 hr tablet Take 1 tablet (100 mg total) by mouth daily. Take with or immediately following a meal.   . omeprazole (PRILOSEC) 20 MG capsule Take 1 capsule (20 mg total) by mouth daily.   Marland Kitchen tiZANidine (ZANAFLEX) 4 MG tablet    . topiramate (TOPAMAX) 50 MG tablet Take 1.5 tablets by mouth daily.   . valACYclovir (VALTREX) 1000 MG tablet Take 1 tablet (1,000 mg total) by mouth 3 (three) times daily.   Marland Kitchen zolpidem (AMBIEN) 10 MG tablet TAKE 1 TABLET BY MOUTH AT BEDTIME AS NEEDED FOR SLEEP   . SUMAtriptan (IMITREX) 100 MG tablet Take 1 tablet by mouth  every 2 hours as needed for migraine. Max 2 tablets in  24 hours. 10/20/2015: Received  from: [facility]  . SUMAtriptan (IMITREX) 20 MG/ACT nasal spray Place 1 spray into the nose. 03/18/2015: Received from: [facility]  . venlafaxine XR (EFFEXOR-XR) 75 MG 24 hr capsule Take 225 mg by mouth. 03/18/2015: [facility]: [facility]   No facility-administered encounter medications on file as of 07/09/2020.     Current Diagnosis/Assessment:   Goals Addressed            This Visit's Progress   . Pharmacy Care Plan   On track    CARE PLAN ENTRY  Current Barriers:  . Chronic Disease Management support, education, and care coordination needs related to  Hypertension, Depression, and Anxiety   Hypertension BP Readings from Last 3 Encounters:  04/06/20 (!) 142/90  01/06/20 (!) 152/90  10/04/19 (!) 180/100 .  Pharmacist Clinical Goal(s): o Over the next 30 days, patient will work with PharmD and providers to achieve BP goal <130/80 . Current regimen:  o Amlodipine 5 mg daily  o Furosemide 20 mg daily  o Hydralazine 25 mg 2 times daily o Metoprolol succinate 100 mg daily  . Interventions: o Discussed BP goals and importance of medication adherence . Patient self care activities - Over the next 30 days, patient will: o Check BP daily, document, and provide at future appointments o Ensure daily salt intake < 2300 mg/day  Depression/Anxiety . Pharmacist Clinical Goal(s) o Over the next 30 days, patient will work with PharmD and providers to optimize therapy . Current regimen:  o Lorazepam 1 mg twice daily as needed o Venlafaxine XR 75 mg - 3 capsules once daily o Bupropion XL 150 mg daily - 2 tablets daily . Interventions: o Discussed maximum effective dose for venlafaxine is 225 mg/day o Switched alprazolam to lorazepam for cost savings o Recommended to switch bupropion to 300 mg since patient has experienced improvement in symptoms on higher dose . Patient self care activities - Over the next 30 days, patient will: o Take medications as prescribed o Follow up with PCP as needed  Medication management . Pharmacist Clinical Goal(s): o Over the next 30 days, patient will work with PharmD and providers to achieve optimal medication adherence . Current pharmacy: [facility] . Interventions o Comprehensive medication review performed. o Utilize [facility] pharmacy for medication synchronization, packaging and delivery . Patient self care activities - Over the next 30 days, patient will: o Focus on medication adherence by pill pack o Take medications as prescribed o Report any questions or concerns to PharmD and/or provider(s)  Please  see past updates related to this goal by clicking on the "Past Updates" button in the selected goal        Hypertension   BP goal is:  <130/80  Office blood pressures are  BP Readings from Last 3 Encounters:  06/30/20 (!) 148/88  04/06/20 (!) 142/90  01/06/20 (!) 152/90   Kidney Function Lab Results  Component Value Date/Time   CREATININE 1.43 (H) 04/06/2020 02:23 PM   CREATININE 1.20 01/06/2020 03:16 PM   GFR 36.33 (L) 04/06/2020 02:23 PM   GFRNONAA 48.80 03/10/2010 11:47 AM   GFRAA 73 12/10/2008 07:53 AM   K 3.8 04/06/2020 02:23 PM   K 3.3 (L) 01/06/2020 03:16 PM   Patient checks BP at home 1-2x per week  Patient home BP readings are ranging: 131/82   Patient has failed these meds in the past: Candesartan, lisinopril (cough), felodipine, verapamil Patient is currently controlled on the following medications:   Amlodipine 5 mg daily -HS  Furosemide 20 mg daily   Hydralazine 25 mg BID   Metoprolol succinate 100 mg daily - AM  We discussed: pt has not been taking BP meds over the past week or so while she is suffering from shingles infection; she has not been eating much, and she did not want to take meds on empty stomach; encouraged her to try to eat small snacks and reintroduce a few meds at a time to get back on track.  Plan  Continue current medications and control with diet and exercise   Depression / Anxiety   Depression screen [facility] 2/9 09/07/2018 05/09/2016 05/14/2014  Decreased Interest 2 0 0  Down, Depressed, Hopeless 2 0 0  PHQ - 2 Score 4 0 0  Altered sleeping 3 - -  Tired, decreased energy 2 - -  Change in appetite 0 - -  Feeling bad or failure about yourself  1 - -  Trouble concentrating 0 - -  Moving slowly or fidgety/restless 0 - -  Suicidal thoughts 0 - -  PHQ-9 Score 10 - -  Difficult doing work/chores Somewhat difficult - -   Patient has failed these meds in past: duloxetine, sertraline Patient is currently uncontrolled on the following  medications:   Lorazepam 1 mg BID prn  Venlafaxine XR 75 mg - 3 caps AM  Bupropion XL 150 mg daily - 2 tablets daily  We discussed:  Pt reports improved mood since increasing bupropion to 2 tablets about 3 weeks ago, she would like to continue this dose. She denies issues or side effects.  Plan:  Recommend bupropion XL 300 mg once daily to replace 150 mg x 2   Shingles   Patient has failed these meds in past: n/a Patient is currently controlled on the following medications:  . Valacyclovir 1000 mg TID x 7 days  We discussed:  Pt reports she is in a lot of pain, hydrocodone and cyclobenzaprine have been helping some but pt is trying to limit these as much as possible. Discussed future options if pain does not improve as infection resolves - Shingrix vaccine is recommended after active infection has resolved to prevent postherpetic neuralgia and subsequent infections;  Lyrica or gabapentin may help with PHN if she does develop. Pt will reach out to PCP if pain does not improve over next few weeks.  Plan  Continue current medications  Recommend Shingrix vaccine after active infection has resolved  Medication Management   Pt uses [facility] for all medications 90-day pill packs Pt endorses 100% compliance  We discussed: Reviewed patient's [facility] medication and Epic medication profile assuring there are no discrepancies or gaps in therapy. Confirmed all fill dates appropriate and verified with patient that there is a sufficient quantity of all prescribed medications at home. Informed patient to call me any time if needing medications before scheduled deliveries. The anticipated medication sync date is 08/05/20.  Pt reports she very happy with [facility] delivery services.    Plan  Utilize [facility] pharmacy for medication synchronization, packaging and delivery    Follow up: 1 month phone visit  Charlene Brooke, PharmD Clinical Pharmacist [facility] at  [facility] [phone]

## 2020-07-13 ENCOUNTER — Telehealth: Payer: Self-pay | Admitting: Pharmacist

## 2020-07-13 NOTE — Progress Notes (Signed)
Received call from patient regarding gabapentin.  Pt reports she is having significant pain related to shingles infection. She used to take gabapentin for pain and is requesting a new rx for this pain.  Per chart the last rx was ordered on 06/28/2018 for gabapentin 100 mg - 3 capsules at bedtime #270 with 0 refills. The rx was discontinued on 01/06/2020.  Will forward request to PCP  Preferred pharmacy: [facility]

## 2020-07-15 ENCOUNTER — Encounter: Payer: Self-pay | Admitting: Internal Medicine

## 2020-07-15 MED ORDER — GABAPENTIN 100 MG PO CAPS: 300.0000 mg | ORAL_CAPSULE | Freq: Every day | ORAL | 1 refills | Status: DC

## 2020-07-15 NOTE — Telephone Encounter (Signed)
sent 

## 2020-07-15 NOTE — Addendum Note (Signed)
Addended by: Binnie Rail on: 07/15/2020 03:59 PM   Modules accepted: Orders

## 2020-07-15 NOTE — Telephone Encounter (Signed)
    Patient calling to follow up on request for Gabapentin

## 2020-07-20 ENCOUNTER — Encounter: Payer: Self-pay | Admitting: Internal Medicine

## 2020-07-20 ENCOUNTER — Other Ambulatory Visit: Payer: Self-pay | Admitting: Internal Medicine

## 2020-07-20 MED ORDER — LORAZEPAM 1 MG PO TABS
1.0000 mg | ORAL_TABLET | Freq: Two times a day (BID) | ORAL | 0 refills | Status: DC
Start: 2020-07-20 — End: 2020-08-14

## 2020-07-20 MED ORDER — ZOLPIDEM TARTRATE 10 MG PO TABS
ORAL_TABLET | ORAL | 1 refills | Status: DC
Start: 2020-07-20 — End: 2020-09-24

## 2020-07-28 NOTE — Progress Notes (Signed)
Reviewed chart and insurance data for medication adherence. Patient does not have any gaps in adherence and is not in danger of failing any Medicare adherence measures. No further action required. ° °

## 2020-07-29 ENCOUNTER — Telehealth: Payer: Self-pay | Admitting: Pharmacist

## 2020-07-29 ENCOUNTER — Other Ambulatory Visit: Payer: Self-pay

## 2020-07-29 ENCOUNTER — Ambulatory Visit (INDEPENDENT_AMBULATORY_CARE_PROVIDER_SITE_OTHER): Payer: Medicare HMO | Admitting: Internal Medicine

## 2020-07-29 ENCOUNTER — Encounter: Payer: Self-pay | Admitting: Internal Medicine

## 2020-07-29 ENCOUNTER — Ambulatory Visit (INDEPENDENT_AMBULATORY_CARE_PROVIDER_SITE_OTHER): Payer: Medicare HMO

## 2020-07-29 VITALS — BP 138/84 | HR 79 | Temp 98.4°F | Wt 142.0 lb

## 2020-07-29 VITALS — BP 138/84 | HR 79 | Temp 98.4°F | Resp 16 | Ht 63.0 in | Wt 142.0 lb

## 2020-07-29 DIAGNOSIS — G43709 Chronic migraine without aura, not intractable, without status migrainosus: Secondary | ICD-10-CM

## 2020-07-29 DIAGNOSIS — B029 Zoster without complications: Secondary | ICD-10-CM | POA: Diagnosis not present

## 2020-07-29 DIAGNOSIS — F3289 Other specified depressive episodes: Secondary | ICD-10-CM

## 2020-07-29 DIAGNOSIS — R52 Pain, unspecified: Secondary | ICD-10-CM | POA: Diagnosis not present

## 2020-07-29 DIAGNOSIS — F419 Anxiety disorder, unspecified: Secondary | ICD-10-CM

## 2020-07-29 DIAGNOSIS — G4700 Insomnia, unspecified: Secondary | ICD-10-CM

## 2020-07-29 DIAGNOSIS — R69 Illness, unspecified: Secondary | ICD-10-CM | POA: Diagnosis not present

## 2020-07-29 DIAGNOSIS — R7303 Prediabetes: Secondary | ICD-10-CM | POA: Diagnosis not present

## 2020-07-29 DIAGNOSIS — I10 Essential (primary) hypertension: Secondary | ICD-10-CM

## 2020-07-29 DIAGNOSIS — Z0000 Encounter for general adult medical examination without abnormal findings: Secondary | ICD-10-CM | POA: Diagnosis not present

## 2020-07-29 DIAGNOSIS — IMO0002 Reserved for concepts with insufficient information to code with codable children: Secondary | ICD-10-CM

## 2020-07-29 DIAGNOSIS — N1832 Chronic kidney disease, stage 3b: Secondary | ICD-10-CM

## 2020-07-29 MED ORDER — HYDROCODONE-ACETAMINOPHEN 7.5-325 MG PO TABS: 1.0000 | ORAL_TABLET | Freq: Three times a day (TID) | ORAL | 0 refills | Status: DC | PRN

## 2020-07-29 MED ORDER — GABAPENTIN 100 MG PO CAPS: ORAL_CAPSULE | ORAL | 1 refills | Status: DC

## 2020-07-29 MED ORDER — BUPROPION HCL ER (XL) 300 MG PO TB24: 300.0000 mg | ORAL_TABLET | Freq: Every day | ORAL | 1 refills | Status: DC

## 2020-07-29 NOTE — Assessment & Plan Note (Signed)
Chronic Controlled, stable Continue current dose of medication Ativan BID venlafaxine

## 2020-07-29 NOTE — Assessment & Plan Note (Signed)
Chronic Controlled, stable Continue current dose of medication ambien

## 2020-07-29 NOTE — Assessment & Plan Note (Signed)
Subacute Rash resolved, now with nerve pain Taking gabapentin - increase - can take 400 mg HS, 100-200 mg in the morning Can titrate further if needed

## 2020-07-29 NOTE — Assessment & Plan Note (Signed)
Chronic Has been stable Will recheck labs at next visit

## 2020-07-29 NOTE — Progress Notes (Signed)
Subjective:    Patient ID: Michaela Rios, female    DOB: 04/23/1951, 69 y.o.   MRN: [id number]  HPI The patient is here for follow up of their chronic medical problems, including pain management, migraines, htn, anxiety, depression, insomnia, GERD, prediabetes  chronic pain management.  Indication for chronic opioid: chronic migraines Medication and dose: vicodin 7.5-325 mg prn # pills per month: 15 Last UDS date: 04/06/20 Pain contract signed (Y/N): Y - 04/06/20 Date narcotic database last reviewed 06/23/2020  Pain assessment:  Pain intensity: 10/10 when severe Amount of pain relief with medication: gets relief Use of pain medications: appropriately taking medications Side effects:  no Sleep:  Not sleeping through night due to pain from shingles Mood:  Has depression, anxiety- improved Functional activities: continues to be active:  yes   Any concerning Alcohol, Street Drug use: no   She is taking all of her medications as prescribed.     Has depression and anxiety.  Her son and her family moved to [place], which is causing some depression  - she feels is accepting this more and her depression is better.     Medications and allergies reviewed with patient and updated if appropriate.  Patient Active Problem List   Diagnosis Date Noted  . Tinea corporis 06/30/2020  . Herpes zoster without complication 95/62/1308  . Urinary urgency 04/06/2020  . Left lumbar radiculopathy 06/04/2018  . CKD (chronic kidney disease) stage 3, GFR 30-59 ml/min 06/04/2018  . Pain management contract signed 06/03/2018  . Encounter for pain management 03/05/2018  . Memory difficulties 03/05/2018  . Hormone replacement therapy (HRT) 12/04/2017  . Sialadenitis, right parotid 06/24/2017  . Insomnia 05/12/2017  . Prediabetes 11/08/2016  . Vitamin D deficiency 06/08/2009  . Depression 01/07/2009  . PEPTIC ULCER DISEASE, CHRONIC 01/07/2009  . OTHER OBSTRUCTION OF DUODENUM 01/07/2009  .  HIATAL HERNIA 01/07/2009  . Irritable bowel syndrome 01/07/2009  . DEGENERATIVE JOINT DISEASE 01/07/2009  . ANEMIA, HX OF 01/07/2009  . PULMONARY NODULE 06/09/2008  . Anxiety 04/01/2008  . Essential hypertension 04/01/2008  . Allergic rhinitis 04/01/2008  . GERD 04/01/2008  . LOW BACK PAIN SYNDROME 04/01/2008  . Chronic migraine 04/01/2008    Current Outpatient Medications on File Prior to Visit  Medication Sig Dispense Refill  . amLODipine (NORVASC) 5 MG tablet Take 1 tablet (5 mg total) by mouth daily. 90 tablet 0  . calcium-vitamin D (OSCAL 500/200 D-3) 500-200 MG-UNIT per tablet Take 1 tablet by mouth twice daily     . ciclopirox (LOPROX) 0.77 % cream Apply topically 2 (two) times daily. 30 g 0  . cyclobenzaprine (FLEXERIL) 10 MG tablet Take 1 tablet (10 mg total) by mouth 3 (three) times daily as needed for muscle spasms. 90 tablet 0  . EPINEPHrine (EPIPEN 2-PAK) 0.3 mg/0.3 mL IJ SOAJ injection Inject 0.3 mLs (0.3 mg total) into the muscle as needed for anaphylaxis. 1 each 0  . furosemide (LASIX) 20 MG tablet Take 1 tablet (20 mg total) by mouth daily. 90 tablet 2  . gabapentin (NEURONTIN) 100 MG capsule Take 3 capsules (300 mg total) by mouth at bedtime. 270 capsule 1  . hydrALAZINE (APRESOLINE) 25 MG tablet Take 1 tablet (25 mg total) by mouth 3 (three) times daily. (Patient taking differently: Take 25 mg by mouth in the morning and at bedtime. ) 60 tablet 5  . HYDROcodone-acetaminophen (NORCO) 7.5-325 MG tablet Take 1 tablet by mouth 3 (three) times daily as needed for severe pain (  headaches). 15 tablet 0  . LORazepam (ATIVAN) 1 MG tablet Take 1 tablet (1 mg total) by mouth 2 (two) times daily. 60 tablet 0  . metoprolol succinate (TOPROL-XL) 100 MG 24 hr tablet Take 1 tablet (100 mg total) by mouth daily. Take with or immediately following a meal. 90 tablet 3  . omeprazole (PRILOSEC) 20 MG capsule Take 1 capsule (20 mg total) by mouth daily. 90 capsule 3  . tiZANidine (ZANAFLEX) 4  MG tablet     . topiramate (TOPAMAX) 50 MG tablet Take 1.5 tablets by mouth daily.    . valACYclovir (VALTREX) 1000 MG tablet Take 1 tablet (1,000 mg total) by mouth 3 (three) times daily. 21 tablet 0  . zolpidem (AMBIEN) 10 MG tablet TAKE 1 TABLET BY MOUTH AT BEDTIME AS NEEDED FOR SLEEP 30 tablet 1  . [DISCONTINUED] buPROPion (WELLBUTRIN XL) 150 MG 24 hr tablet Take 1 tablet (150 mg total) by mouth daily. 30 tablet 5  . SUMAtriptan (IMITREX) 100 MG tablet Take 1 tablet by mouth  every 2 hours as needed for migraine. Max 2 tablets in  24 hours.    . SUMAtriptan (IMITREX) 20 MG/ACT nasal spray Place 1 spray into the nose.    . venlafaxine XR (EFFEXOR-XR) 75 MG 24 hr capsule Take 225 mg by mouth.     No current facility-administered medications on file prior to visit.    Past Medical History:  Diagnosis Date  . Anemia   . Anxiety   . Arthritis   . Cataract   . Chronic migraine    follows with neuro for same  . Clotting disorder (HCC)    clot in ovarian vein- hx  . Esophagitis   . GERD (gastroesophageal reflux disease)   . Hypertension   . IBS (irritable bowel syndrome)   . Low back pain syndrome   . MRSA (methicillin resistant Staphylococcus aureus) infection 04/17/12   left torso  . Peptic ulcer disease 2003, 12/2012  . Peripheral vascular disease ([place])   . Vitamin D deficiency     Past Surgical History:  Procedure Laterality Date  . ABDOMINAL HYSTERECTOMY    . LAPAROSCOPIC BILATERAL SALPINGO OOPHERECTOMY    . pud surgery w/duod sticture-plasty and vagotomy  2003   Dr. Marlou Starks  . TONSILLECTOMY AND ADENOIDECTOMY     as a child  . TUBAL LIGATION  1984    Social History   Socioeconomic History  . Marital status: Married    Spouse name: Levada Dy  . Number of children: 1  . Years of education: Not on file  . Highest education level: Not on file  Occupational History  . Occupation: retired  Tobacco Use  . Smoking status: Never Smoker  . Smokeless tobacco: Never Used  Vaping  Use  . Vaping Use: Never used  Substance and Sexual Activity  . Alcohol use: Yes    Comment: rare  . Drug use: No  . Sexual activity: Yes  Other Topics Concern  . Not on file  Social History Narrative   Lives with spouse    Grown son in [place], 2 g kids   Social Determinants of Health   Financial Resource Strain: Medium Risk  . Difficulty of Paying Living Expenses: Somewhat hard  Food Insecurity:   . Worried About Charity fundraiser in the Last Year: Not on file  . Ran Out of Food in the Last Year: Not on file  Transportation Needs:   . Lack of Transportation (Medical): Not on  file  . Lack of Transportation (Non-Medical): Not on file  Physical Activity:   . Days of Exercise per Week: Not on file  . Minutes of Exercise per Session: Not on file  Stress:   . Feeling of Stress : Not on file  Social Connections:   . Frequency of Communication with Friends and Family: Not on file  . Frequency of Social Gatherings with Friends and Family: Not on file  . Attends Religious Services: Not on file  . Active Member of Clubs or Organizations: Not on file  . Attends Archivist Meetings: Not on file  . Marital Status: Not on file    Family History  Problem Relation Age of Onset  . Aneurysm Father   . Prostate cancer Brother   . Lung cancer Brother   . Colon cancer Neg Hx   . Esophageal cancer Neg Hx   . Rectal cancer Neg Hx   . Stomach cancer Neg Hx     Review of Systems  Constitutional: Negative for chills and fever.  Respiratory: Positive for cough (occ x 2 days or so). Negative for shortness of breath and wheezing.   Cardiovascular: Negative for chest pain, palpitations and leg swelling.  Gastrointestinal: Negative for abdominal pain and nausea.  Neurological: Positive for light-headedness (occ with standing) and headaches.       Objective:   Vitals:   07/29/20 1311  BP: 138/84  Pulse: 79  Temp: 98.4 F (36.9 C)  SpO2: 90%   BP Readings from Last 3  Encounters:  07/29/20 138/84  06/30/20 (!) 148/88  04/06/20 (!) 142/90   Wt Readings from Last 3 Encounters:  07/29/20 142 lb (64.4 kg)  06/30/20 147 lb (66.7 kg)  04/06/20 153 lb (69.4 kg)   Body mass index is 25.15 kg/m.   Physical Exam    Constitutional: Appears well-developed and well-nourished. No distress.  HENT:  Head: Normocephalic and atraumatic.  Neck: Neck supple. No tracheal deviation present. No thyromegaly present.  No cervical lymphadenopathy Cardiovascular: Normal rate, regular rhythm and normal heart sounds.   No murmur heard. No carotid bruit .  No edema Pulmonary/Chest: Effort normal and breath sounds normal. No respiratory distress. No has no wheezes. No rales.  Skin: Skin is warm and dry. Not diaphoretic. Peeling skin on hand from shingles Psychiatric: Normal mood and affect. Behavior is normal.      Assessment & Plan:    See Problem List for Assessment and Plan of chronic medical problems.    This visit occurred during the SARS-CoV-2 public health emergency.  Safety protocols were in place, including screening questions prior to the visit, additional usage of staff PPE, and extensive cleaning of exam room while observing appropriate contact time as indicated for disinfecting solutions.

## 2020-07-29 NOTE — Progress Notes (Signed)
Subjective:   TAKYLA KUCHERA is a 69 y.o. female who presents for Medicare Annual (Subsequent) preventive examination.  Review of Systems    No ROS. Medicare Wellness Visit Cardiac Risk Factors include: advanced age (>57men, >27 women);hypertension     Objective:    Today's Vitals   07/29/20 1345  BP: 138/84  Pulse: 79  Resp: 16  Temp: 98.4 F (36.9 C)  SpO2: 90%  Weight: 142 lb (64.4 kg)  Height: 5\' 3"  (1.6 m)  PainSc: 6   PainLoc: Hand   Body mass index is 25.15 kg/m.  Advanced Directives 07/29/2020 09/07/2018  Does Patient Have a Medical Advance Directive? Yes Yes  Type of Advance Directive Living will;Healthcare Power of [place];Living will  Does patient want to make changes to medical advance directive? No - Patient declined -  Copy of [place] in Chart? No - copy requested No - copy requested    Current Medications (verified) Outpatient Encounter Medications as of 07/29/2020  Medication Sig  . amLODipine (NORVASC) 5 MG tablet Take 1 tablet (5 mg total) by mouth daily.  Marland Kitchen buPROPion (WELLBUTRIN XL) 300 MG 24 hr tablet Take 1 tablet (300 mg total) by mouth daily.  . calcium-vitamin D (OSCAL 500/200 D-3) 500-200 MG-UNIT per tablet Take 1 tablet by mouth twice daily   . ciclopirox (LOPROX) 0.77 % cream Apply topically 2 (two) times daily.  . cyclobenzaprine (FLEXERIL) 10 MG tablet Take 1 tablet (10 mg total) by mouth 3 (three) times daily as needed for muscle spasms.  Marland Kitchen EPINEPHrine (EPIPEN 2-PAK) 0.3 mg/0.3 mL IJ SOAJ injection Inject 0.3 mLs (0.3 mg total) into the muscle as needed for anaphylaxis.  . furosemide (LASIX) 20 MG tablet Take 1 tablet (20 mg total) by mouth daily.  Marland Kitchen gabapentin (NEURONTIN) 100 MG capsule Take 100-200 mg in morning and 400 mg at bedtime  . hydrALAZINE (APRESOLINE) 25 MG tablet Take 1 tablet (25 mg total) by mouth 3 (three) times daily. (Patient taking differently: Take 25 mg by mouth in  the morning and at bedtime. )  . LORazepam (ATIVAN) 1 MG tablet Take 1 tablet (1 mg total) by mouth 2 (two) times daily.  . metoprolol succinate (TOPROL-XL) 100 MG 24 hr tablet Take 1 tablet (100 mg total) by mouth daily. Take with or immediately following a meal.  . omeprazole (PRILOSEC) 20 MG capsule Take 1 capsule (20 mg total) by mouth daily.  . SUMAtriptan (IMITREX) 100 MG tablet Take 1 tablet by mouth  every 2 hours as needed for migraine. Max 2 tablets in  24 hours.  . SUMAtriptan (IMITREX) 20 MG/ACT nasal spray Place 1 spray into the nose.  Marland Kitchen tiZANidine (ZANAFLEX) 4 MG tablet   . topiramate (TOPAMAX) 50 MG tablet Take 1.5 tablets by mouth daily.  . valACYclovir (VALTREX) 1000 MG tablet Take 1 tablet (1,000 mg total) by mouth 3 (three) times daily.  Marland Kitchen venlafaxine XR (EFFEXOR-XR) 75 MG 24 hr capsule Take 225 mg by mouth.  . zolpidem (AMBIEN) 10 MG tablet TAKE 1 TABLET BY MOUTH AT BEDTIME AS NEEDED FOR SLEEP   No facility-administered encounter medications on file as of 07/29/2020.    Allergies (verified) Amoxicillin, Cephalexin, and Lisinopril   History: Past Medical History:  Diagnosis Date  . Anemia   . Anxiety   . Arthritis   . Cataract   . Chronic migraine    follows with neuro for same  . Clotting disorder ([place])  clot in ovarian vein- hx  . Esophagitis   . GERD (gastroesophageal reflux disease)   . Hypertension   . IBS (irritable bowel syndrome)   . Low back pain syndrome   . MRSA (methicillin resistant Staphylococcus aureus) infection 04/17/12   left torso  . Peptic ulcer disease 2003, 12/2012  . Peripheral vascular disease ([place])   . Vitamin D deficiency    Past Surgical History:  Procedure Laterality Date  . ABDOMINAL HYSTERECTOMY    . LAPAROSCOPIC BILATERAL SALPINGO OOPHERECTOMY    . pud surgery w/duod sticture-plasty and vagotomy  2003   Dr. Marlou Starks  . TONSILLECTOMY AND ADENOIDECTOMY     as a child  . TUBAL LIGATION  1984   Family History  Problem Relation  Age of Onset  . Aneurysm Father   . Prostate cancer Brother   . Lung cancer Brother   . Colon cancer Neg Hx   . Esophageal cancer Neg Hx   . Rectal cancer Neg Hx   . Stomach cancer Neg Hx    Social History   Socioeconomic History  . Marital status: Married    Spouse name: Levada Dy  . Number of children: 1  . Years of education: Not on file  . Highest education level: Not on file  Occupational History  . Occupation: retired  Tobacco Use  . Smoking status: Never Smoker  . Smokeless tobacco: Never Used  Vaping Use  . Vaping Use: Never used  Substance and Sexual Activity  . Alcohol use: Yes    Comment: rare  . Drug use: No  . Sexual activity: Yes  Other Topics Concern  . Not on file  Social History Narrative   Lives with spouse    Grown son in [place], 2 g kids   Social Determinants of Health   Financial Resource Strain: Medium Risk  . Difficulty of Paying Living Expenses: Somewhat hard  Food Insecurity: No Food Insecurity  . Worried About Charity fundraiser in the Last Year: Never true  . Ran Out of Food in the Last Year: Never true  Transportation Needs: No Transportation Needs  . Lack of Transportation (Medical): No  . Lack of Transportation (Non-Medical): No  Physical Activity: Inactive  . Days of Exercise per Week: 0 days  . Minutes of Exercise per Session: 0 min  Stress: Stress Concern Present  . Feeling of Stress : Very much  Social Connections: Moderately Isolated  . Frequency of Communication with Friends and Family: More than three times a week  . Frequency of Social Gatherings with Friends and Family: Never  . Attends Religious Services: Never  . Active Member of Clubs or Organizations: No  . Attends Archivist Meetings: Never  . Marital Status: Married    Tobacco Counseling Counseling given: Not Answered   Clinical Intake:  Pre-visit preparation completed: Yes  Pain : 0-10 Pain Score: 6  Pain Type: Acute pain Pain Location: Hand  (Patient has shingles on palm of right hand) Pain Orientation: Right Pain Descriptors / Indicators: Constant, Discomfort Pain Onset: 1 to 4 weeks ago Pain Frequency: Constant     BMI - recorded: 25.15 Nutritional Status: BMI 25 -29 Overweight Nutritional Risks: None Diabetes: No  How often do you need to have someone help you when you read instructions, pamphlets, or other written materials from your doctor or pharmacy?: 1 - Never What is the last grade level you completed in school?: Associate's Degree  Diabetic? no  Interpreter Needed?: No  Information  entered by :: Deegan Valentino N. Aanika Defoor, LPN   Activities of Daily Living In your present state of health, do you have any difficulty performing the following activities: 07/29/2020  Hearing? N  Vision? N  Difficulty concentrating or making decisions? Y  Walking or climbing stairs? N  Dressing or bathing? N  Doing errands, shopping? N  Preparing Food and eating ? N  Using the Toilet? N  In the past six months, have you accidently leaked urine? N  Do you have problems with loss of bowel control? N  Managing your Medications? N  Managing your Finances? N  Housekeeping or managing your Housekeeping? N  Some recent data might be hidden    Patient Care Team: Binnie Rail, MD as PCP - General (Internal Medicine) Lafayette Dragon, MD (Inactive) (Gastroenterology) Amil Amen, MD (Neurology) Harold Hedge, Darrick Grinder, MD (Allergy and Immunology) Alden Hipp, MD (Obstetrics and Gynecology) Charlton Haws, [facility] as Pharmacist (Pharmacist)  Indicate any recent Medical Services you may have received from other than [facility] providers in the past year (date may be approximate).     Assessment:   This is a routine wellness examination for Annaliza.  Hearing/Vision screen No exam data present  Dietary issues and exercise activities discussed: Current Exercise Habits: The patient does not participate in regular exercise at  present, Exercise limited by: psychological condition(s)  Goals    .  Patient Stated      I want to increase my physical activity by joining [employer].    .  Patient Stated (pt-stated)      My goal is to get rid of this painful shingles in my right hand.    .  Pharmacy Care Plan      CARE PLAN ENTRY  Current Barriers:  . Chronic Disease Management support, education, and care coordination needs related to Hypertension, Depression, and Anxiety   Hypertension BP Readings from Last 3 Encounters:  04/06/20 (!) 142/90  01/06/20 (!) 152/90  10/04/19 (!) 180/100 .  Pharmacist Clinical Goal(s): o Over the next 30 days, patient will work with PharmD and providers to achieve BP goal <130/80 . Current regimen:  o Amlodipine 5 mg daily  o Furosemide 20 mg daily  o Hydralazine 25 mg 2 times daily o Metoprolol succinate 100 mg daily  . Interventions: o Discussed BP goals and importance of medication adherence . Patient self care activities - Over the next 30 days, patient will: o Check BP daily, document, and provide at future appointments o Ensure daily salt intake < 2300 mg/day  Depression/Anxiety . Pharmacist Clinical Goal(s) o Over the next 30 days, patient will work with PharmD and providers to optimize therapy . Current regimen:  o Lorazepam 1 mg twice daily as needed o Venlafaxine XR 75 mg - 3 capsules once daily o Bupropion XL 150 mg daily - 2 tablets daily . Interventions: o Discussed maximum effective dose for venlafaxine is 225 mg/day o Switched alprazolam to lorazepam for cost savings o Recommended to switch bupropion to 300 mg since patient has experienced improvement in symptoms on higher dose . Patient self care activities - Over the next 30 days, patient will: o Take medications as prescribed o Follow up with PCP as needed  Medication management . Pharmacist Clinical Goal(s): o Over the next 30 days, patient will work with PharmD and providers to achieve  optimal medication adherence . Current pharmacy: [facility] . Interventions o Comprehensive medication review performed. o Utilize [facility] pharmacy for medication synchronization,  packaging and delivery . Patient self care activities - Over the next 30 days, patient will: o Focus on medication adherence by pill pack o Take medications as prescribed o Report any questions or concerns to PharmD and/or provider(s)  Please see past updates related to this goal by clicking on the "Past Updates" button in the selected goal       Depression Screen PHQ 2/9 Scores 07/29/2020 09/07/2018 05/09/2016 05/14/2014  PHQ - 2 Score 0 4 0 0  PHQ- 9 Score - 10 - -    Fall Risk Fall Risk  07/29/2020 01/06/2020 09/07/2018 09/07/2018 05/09/2016  Falls in the past year? 1 1 No No Yes  Number falls in past yr: 1 1 - - 2 or more  Injury with Fall? 0 0 - - No  Risk Factor Category  - - - - -  Risk for fall due to : Medication side effect - - - Impaired balance/gait  Follow up Falls evaluation completed - - - -    Any stairs in or around the home? No  If so, are there any without handrails? No  Home free of loose throw rugs in walkways, pet beds, electrical cords, etc? Yes  Adequate lighting in your home to reduce risk of falls? Yes   ASSISTIVE DEVICES UTILIZED TO PREVENT FALLS:  Life alert? No  Use of a cane, walker or w/c? No  Grab bars in the bathroom? Yes  Shower chair or bench in shower? Yes  Elevated toilet seat or a handicapped toilet? No   TIMED UP AND GO:  Was the test performed? No .  Length of time to ambulate 10 feet: 0 sec.   Gait steady and fast without use of assistive device  Cognitive Function: MMSE - Mini Mental State Exam 09/07/2018  Orientation to time 5  Orientation to Place 5  Registration 3  Attention/ Calculation 5  Recall 3  Language- name 2 objects 2  Language- repeat 1  Language- follow 3 step command 3  Language- read & follow direction 1  Write a sentence 1  Copy  design 1  Total score 30     6CIT Screen 07/29/2020  What Year? 0 points  What month? 0 points  What time? 0 points  Count back from 20 0 points  Months in reverse 0 points  Repeat phrase 0 points  Total Score 0    Immunizations Immunization History  Administered Date(s) Administered  . PFIZER SARS-COV-2 Vaccination 01/11/2020, 02/05/2020  . Tdap 06/21/2013    TDAP status: Up to date Flu Vaccine status: Declined, Education has been provided regarding the importance of this vaccine but patient still declined. Advised may receive this vaccine at local pharmacy or Health Dept. Aware to provide a copy of the vaccination record if obtained from local pharmacy or Health Dept. Verbalized acceptance and understanding. Pneumococcal vaccine status: Declined,  Education has been provided regarding the importance of this vaccine but patient still declined. Advised may receive this vaccine at local pharmacy or Health Dept. Aware to provide a copy of the vaccination record if obtained from local pharmacy or Health Dept. Verbalized acceptance and understanding.  Covid-19 vaccine status: Completed vaccines  Qualifies for Shingles Vaccine? Yes   Zostavax completed No   Shingrix Completed?: No.    Education has been provided regarding the importance of this vaccine. Patient has been advised to call insurance company to determine out of pocket expense if they have not yet received this vaccine. Advised may also  receive vaccine at local pharmacy or Health Dept. Verbalized acceptance and understanding.  Screening Tests Health Maintenance  Topic Date Due  . MAMMOGRAM  08/10/2018  . DEXA SCAN  01/28/2019  . PNA vac Low Risk Adult (1 of 2 - PCV13) 01/05/2021 (Originally 10/02/2015)  . COLONOSCOPY  02/23/2023  . TETANUS/TDAP  06/22/2023  . COVID-19 Vaccine  Completed  . Hepatitis C Screening  Completed    Health Maintenance  Health Maintenance Due  Topic Date Due  . MAMMOGRAM  08/10/2018  . DEXA  SCAN  01/28/2019    Colorectal cancer screening: Completed 02/22/2013. Repeat every 10 years Mammogram status: Completed scheduled for 08/19/2020. Repeat every year Bone Density status: Ordered 08/19/2020. Pt provided with contact info and advised to call to schedule appt.  Lung Cancer Screening: (Low Dose CT Chest recommended if Age 41-80 years, 30 pack-year currently smoking OR have quit w/in 15years.) does not qualify.   Lung Cancer Screening Referral: no  Additional Screening:  Hepatitis C Screening: does qualify; Completed yes  Vision Screening: Recommended annual ophthalmology exams for early detection of glaucoma and other disorders of the eye. Is the patient up to date with their annual eye exam?  Yes  Who is the provider or what is the name of the office in which the patient attends annual eye exams? Macarthur Critchley, MD If pt is not established with a provider, would they like to be referred to a provider to establish care? No .   Dental Screening: Recommended annual dental exams for proper oral hygiene  Community Resource Referral / Chronic Care Management: CRR required this visit?  No   CCM required this visit?  No      Plan:     I have personally reviewed and noted the following in the patient's chart:   . Medical and social history . Use of alcohol, tobacco or illicit drugs  . Current medications and supplements . Functional ability and status . Nutritional status . Physical activity . Advanced directives . List of other physicians . Hospitalizations, surgeries, and ER visits in previous 12 months . Vitals . Screenings to include cognitive, depression, and falls . Referrals and appointments  In addition, I have reviewed and discussed with patient certain preventive protocols, quality metrics, and best practice recommendations. A written personalized care plan for preventive services as well as general preventive health recommendations were provided to patient.      Sheral Flow, LPN   06/30/3824   Nurse Notes: n/a

## 2020-07-29 NOTE — Assessment & Plan Note (Signed)
Chronic BP well controlled Current regimen effective and well tolerated Continue current medications at current doses cmp  

## 2020-07-29 NOTE — Assessment & Plan Note (Signed)
Chronic Pain management for migraines that are severe Follows with neuro Uses vicodin only when all other meds fail [place] controlled substance database checked.  Ok to fill medication.  Continue vicodin only as needed F/u in 4 months

## 2020-07-29 NOTE — Progress Notes (Signed)
Reviewed chart for medication changes ahead of medication coordination call.  BP Readings from Last 3 Encounters:  07/29/20 138/84  07/29/20 138/84  06/30/20 (!) 148/88    Lab Results  Component Value Date/Time   HGBA1C 5.5 04/06/2020 02:23 PM   HGBA1C 6.0 01/06/2020 03:16 PM   Medication coordination via [facility]: Patient obtains medications through 30 Days Adherence Packaging    Patient is due for next adherence delivery on: 08/05/20 Called patient and reviewed medications and coordinated delivery.  This delivery to include: Amlodipine 5 mg 1 tablet - Breakfast Furosemide 20 mg 1 tablet - Breakfast Hydralazine 25 mg BID - Breakfast, Bedtime Metoprolol succinate 100 mg 1 tablet - Breakfast Bupropion XL 300 mg 1 tablet - Breakfast Venlafaxine ER 75 mg 3 tablet - breakfast Topiramate 100 mg 1.5 tab - Breakfast  Patient will need a short fill of bupropion XL 300 mg prior to adherence delivery. Coordinated acute fill for bupropion, gabapentin, and hydrocodone-APAP to be delivered: 07/30/20  Patient needs refills for topiramate - requested from Headache clinic.  Confirmed delivery date of 07/30/20 and 08/05/20, advised patient that pharmacy will contact them the morning of delivery.  Charlton Haws, [facility]

## 2020-07-29 NOTE — Patient Instructions (Signed)
Michaela Rios , Thank you for taking time to come for your Medicare Wellness Visit. I appreciate your ongoing commitment to your health goals. Please review the following plan we discussed and let me know if I can assist you in the future.   Screening recommendations/referrals: Colonoscopy: 02/22/2013; due every 10 years Mammogram: 08/10/2016; scheduled for 08/19/2020 per patient Bone Density: 01/27/2014; scheduled for 08/19/2020 per patient Recommended yearly ophthalmology/optometry visit for glaucoma screening and checkup Recommended yearly dental visit for hygiene and checkup  Vaccinations: Influenza vaccine: declined Pneumococcal vaccine: declined Tdap vaccine: 06/21/2013; due every 10 years; due 2024 Shingles vaccine: never done   Covid-19: completed  Advanced directives: Please bring a copy of your health care power of attorney and living will to the office at your convenience.  Conditions/risks identified: Yes. Reviewed health maintenance screenings with patient today and relevant education, vaccines, and/or referrals were provided. Continue doing brain stimulating activities (puzzles, reading, adult coloring books, staying active) to keep memory sharp. Continue to eat heart healthy diet (full of fruits, vegetables, whole grains, lean protein, water--limit salt, fat, and sugar intake) and increase physical activity as tolerated.  Next appointment: Please schedule your next Medicare Wellness Visit with your Nurse Health Advisor in 1 year.   Preventive Care 25 Years and Older, Female Preventive care refers to lifestyle choices and visits with your health care provider that can promote health and wellness. What does preventive care include?  A yearly physical exam. This is also called an annual well check.  Dental exams once or twice a year.  Routine eye exams. Ask your health care provider how often you should have your eyes checked.  Personal lifestyle choices, including:  Daily  care of your teeth and gums.  Regular physical activity.  Eating a healthy diet.  Avoiding tobacco and drug use.  Limiting alcohol use.  Practicing safe sex.  Taking low-dose aspirin every day.  Taking vitamin and mineral supplements as recommended by your health care provider. What happens during an annual well check? The services and screenings done by your health care provider during your annual well check will depend on your age, overall health, lifestyle risk factors, and family history of disease. Counseling  Your health care provider may ask you questions about your:  Alcohol use.  Tobacco use.  Drug use.  Emotional well-being.  Home and relationship well-being.  Sexual activity.  Eating habits.  History of falls.  Memory and ability to understand (cognition).  Work and work Statistician.  Reproductive health. Screening  You may have the following tests or measurements:  Height, weight, and BMI.  Blood pressure.  Lipid and cholesterol levels. These may be checked every 5 years, or more frequently if you are over 19 years old.  Skin check.  Lung cancer screening. You may have this screening every year starting at age 25 if you have a 30-pack-year history of smoking and currently smoke or have quit within the past 15 years.  Fecal occult blood test (FOBT) of the stool. You may have this test every year starting at age 28.  Flexible sigmoidoscopy or colonoscopy. You may have a sigmoidoscopy every 5 years or a colonoscopy every 10 years starting at age 80.  Hepatitis C blood test.  Hepatitis B blood test.  Sexually transmitted disease (STD) testing.  Diabetes screening. This is done by checking your blood sugar (glucose) after you have not eaten for a while (fasting). You may have this done every 1-3 years.  Bone density scan. This  is done to screen for osteoporosis. You may have this done starting at age 29.  Mammogram. This may be done every 1-2  years. Talk to your health care provider about how often you should have regular mammograms. Talk with your health care provider about your test results, treatment options, and if necessary, the need for more tests. Vaccines  Your health care provider may recommend certain vaccines, such as:  Influenza vaccine. This is recommended every year.  Tetanus, diphtheria, and acellular pertussis (Tdap, Td) vaccine. You may need a Td booster every 10 years.  Zoster vaccine. You may need this after age 15.  Pneumococcal 13-valent conjugate (PCV13) vaccine. One dose is recommended after age 36.  Pneumococcal polysaccharide (PPSV23) vaccine. One dose is recommended after age 61. Talk to your health care provider about which screenings and vaccines you need and how often you need them. This information is not intended to replace advice given to you by your health care provider. Make sure you discuss any questions you have with your health care provider. Document Released: 12/11/2015 Document Revised: 08/03/2016 Document Reviewed: 09/15/2015 Elsevier Interactive Patient Education  2017 [employer] Prevention in the Home Falls can cause injuries. They can happen to people of all ages. There are many things you can do to make your home safe and to help prevent falls. What can I do on the outside of my home?  Regularly fix the edges of walkways and driveways and fix any cracks.  Remove anything that might make you trip as you walk through a door, such as a raised step or threshold.  Trim any bushes or trees on the path to your home.  Use bright outdoor lighting.  Clear any walking paths of anything that might make someone trip, such as rocks or tools.  Regularly check to see if handrails are loose or broken. Make sure that both sides of any steps have handrails.  Any raised decks and porches should have guardrails on the edges.  Have any leaves, snow, or ice cleared regularly.  Use  sand or salt on walking paths during winter.  Clean up any spills in your garage right away. This includes oil or grease spills. What can I do in the bathroom?  Use night lights.  Install grab bars by the toilet and in the tub and shower. Do not use towel bars as grab bars.  Use non-skid mats or decals in the tub or shower.  If you need to sit down in the shower, use a plastic, non-slip stool.  Keep the floor dry. Clean up any water that spills on the floor as soon as it happens.  Remove soap buildup in the tub or shower regularly.  Attach bath mats securely with double-sided non-slip rug tape.  Do not have throw rugs and other things on the floor that can make you trip. What can I do in the bedroom?  Use night lights.  Make sure that you have a light by your bed that is easy to reach.  Do not use any sheets or blankets that are too big for your bed. They should not hang down onto the floor.  Have a firm chair that has side arms. You can use this for support while you get dressed.  Do not have throw rugs and other things on the floor that can make you trip. What can I do in the kitchen?  Clean up any spills right away.  Avoid walking on wet floors.  Keep items that you use a lot in easy-to-reach places.  If you need to reach something above you, use a strong step stool that has a grab bar.  Keep electrical cords out of the way.  Do not use floor polish or wax that makes floors slippery. If you must use wax, use non-skid floor wax.  Do not have throw rugs and other things on the floor that can make you trip. What can I do with my stairs?  Do not leave any items on the stairs.  Make sure that there are handrails on both sides of the stairs and use them. Fix handrails that are broken or loose. Make sure that handrails are as long as the stairways.  Check any carpeting to make sure that it is firmly attached to the stairs. Fix any carpet that is loose or worn.  Avoid  having throw rugs at the top or bottom of the stairs. If you do have throw rugs, attach them to the floor with carpet tape.  Make sure that you have a light switch at the top of the stairs and the bottom of the stairs. If you do not have them, ask someone to add them for you. What else can I do to help prevent falls?  Wear shoes that:  Do not have high heels.  Have rubber bottoms.  Are comfortable and fit you well.  Are closed at the toe. Do not wear sandals.  If you use a stepladder:  Make sure that it is fully opened. Do not climb a closed stepladder.  Make sure that both sides of the stepladder are locked into place.  Ask someone to hold it for you, if possible.  Clearly mark and make sure that you can see:  Any grab bars or handrails.  First and last steps.  Where the edge of each step is.  Use tools that help you move around (mobility aids) if they are needed. These include:  Canes.  Walkers.  Scooters.  Crutches.  Turn on the lights when you go into a dark area. Replace any light bulbs as soon as they burn out.  Set up your furniture so you have a clear path. Avoid moving your furniture around.  If any of your floors are uneven, fix them.  If there are any pets around you, be aware of where they are.  Review your medicines with your doctor. Some medicines can make you feel dizzy. This can increase your chance of falling. Ask your doctor what other things that you can do to help prevent falls. This information is not intended to replace advice given to you by your health care provider. Make sure you discuss any questions you have with your health care provider. Document Released: 09/10/2009 Document Revised: 04/21/2016 Document Reviewed: 12/19/2014 Elsevier Interactive Patient Education  2017 [employer].

## 2020-07-29 NOTE — Assessment & Plan Note (Signed)
Chronic Low sugar / carb diet Stressed regular exercise   

## 2020-07-29 NOTE — Patient Instructions (Addendum)
   Medications reviewed and updated.  Changes include :  Increase gabapentin, continue other medications.    Your prescription(s) have been submitted to your pharmacy. Please take as directed and contact our office if you believe you are having problem(s) with the medication(s).     Please followup in 4 months

## 2020-07-29 NOTE — Assessment & Plan Note (Signed)
Chronic Follows with neuro No major change, ? Some improvement Takes vicodin only if all other medications do not work Continue vicodin as last resort F/u in 4 months

## 2020-07-29 NOTE — Assessment & Plan Note (Signed)
Chronic Controlled, stable - some improvement Continue current dose of medication Venlafaxine wellbutrin 300 mg

## 2020-07-30 DIAGNOSIS — R69 Illness, unspecified: Secondary | ICD-10-CM | POA: Diagnosis not present

## 2020-08-11 ENCOUNTER — Telehealth: Payer: Self-pay | Admitting: Pharmacist

## 2020-08-11 NOTE — Chronic Care Management (AMB) (Signed)
Received call from patient regarding lorazepam and gabapentin. She reports she is taking lorazepam 1 mg AM and PM, but she gets "antsy" in the middle of the day which did not occur when she was taking alprazolam TID. Discussed option to add lorazepam midday dose as needed. Pt will try this for a week and let me know how she does.  Pt reports she was taking gabapentin - 200 mg AM and 400 mg PM for a few days, and she fell once and "passed out" once so she went back to taking 300 mg at night only. She endorses significant pain in her hands. She took hydrocodone to manage pain instead, which does help. Discussed a more gradual dose titration for gabapentin to manage pain - recommended 300 mg at night and 100 mg in AM. Pt will try this and let me know how she does.  F/U call in 1 week to assess response.

## 2020-08-14 ENCOUNTER — Other Ambulatory Visit: Payer: Self-pay | Admitting: Internal Medicine

## 2020-08-14 MED ORDER — LORAZEPAM 1 MG PO TABS
1.0000 mg | ORAL_TABLET | Freq: Two times a day (BID) | ORAL | 0 refills | Status: DC
Start: 2020-08-14 — End: 2020-09-15

## 2020-08-14 MED ORDER — HYDROCODONE-ACETAMINOPHEN 7.5-325 MG PO TABS: 1.0000 | ORAL_TABLET | Freq: Three times a day (TID) | ORAL | 0 refills | Status: DC | PRN

## 2020-08-16 ENCOUNTER — Encounter: Payer: Self-pay | Admitting: Internal Medicine

## 2020-08-17 ENCOUNTER — Other Ambulatory Visit: Payer: Self-pay

## 2020-08-17 ENCOUNTER — Inpatient Hospital Stay (HOSPITAL_COMMUNITY)
Admission: EM | Admit: 2020-08-17 | Discharge: 2020-08-19 | DRG: 690 | Disposition: A | Discharge status: Home / Self Care | Payer: Medicare HMO | Attending: Internal Medicine | Admitting: Internal Medicine

## 2020-08-17 ENCOUNTER — Encounter (HOSPITAL_COMMUNITY): Payer: Self-pay | Admitting: *Deleted

## 2020-08-17 ENCOUNTER — Emergency Department (HOSPITAL_COMMUNITY): Payer: Medicare HMO

## 2020-08-17 DIAGNOSIS — Z8614 Personal history of Methicillin resistant Staphylococcus aureus infection: Secondary | ICD-10-CM

## 2020-08-17 DIAGNOSIS — F13930 Sedative, hypnotic or anxiolytic use, unspecified with withdrawal, uncomplicated: Secondary | ICD-10-CM

## 2020-08-17 DIAGNOSIS — N390 Urinary tract infection, site not specified: Principal | ICD-10-CM | POA: Diagnosis present

## 2020-08-17 DIAGNOSIS — Z90710 Acquired absence of both cervix and uterus: Secondary | ICD-10-CM

## 2020-08-17 DIAGNOSIS — R748 Abnormal levels of other serum enzymes: Secondary | ICD-10-CM | POA: Diagnosis not present

## 2020-08-17 DIAGNOSIS — M6282 Rhabdomyolysis: Secondary | ICD-10-CM | POA: Diagnosis present

## 2020-08-17 DIAGNOSIS — Z9079 Acquired absence of other genital organ(s): Secondary | ICD-10-CM

## 2020-08-17 DIAGNOSIS — K219 Gastro-esophageal reflux disease without esophagitis: Secondary | ICD-10-CM | POA: Diagnosis not present

## 2020-08-17 DIAGNOSIS — R110 Nausea: Secondary | ICD-10-CM | POA: Diagnosis not present

## 2020-08-17 DIAGNOSIS — R112 Nausea with vomiting, unspecified: Secondary | ICD-10-CM | POA: Diagnosis not present

## 2020-08-17 DIAGNOSIS — K828 Other specified diseases of gallbladder: Secondary | ICD-10-CM

## 2020-08-17 DIAGNOSIS — D689 Coagulation defect, unspecified: Secondary | ICD-10-CM | POA: Diagnosis present

## 2020-08-17 DIAGNOSIS — B354 Tinea corporis: Secondary | ICD-10-CM | POA: Diagnosis present

## 2020-08-17 DIAGNOSIS — B029 Zoster without complications: Secondary | ICD-10-CM | POA: Diagnosis present

## 2020-08-17 DIAGNOSIS — Z20822 Contact with and (suspected) exposure to covid-19: Secondary | ICD-10-CM | POA: Diagnosis present

## 2020-08-17 DIAGNOSIS — F13230 Sedative, hypnotic or anxiolytic dependence with withdrawal, uncomplicated: Secondary | ICD-10-CM | POA: Diagnosis not present

## 2020-08-17 DIAGNOSIS — Z8711 Personal history of peptic ulcer disease: Secondary | ICD-10-CM

## 2020-08-17 DIAGNOSIS — Z91128 Patient's intentional underdosing of medication regimen for other reason: Secondary | ICD-10-CM

## 2020-08-17 DIAGNOSIS — R1110 Vomiting, unspecified: Secondary | ICD-10-CM | POA: Diagnosis not present

## 2020-08-17 DIAGNOSIS — E860 Dehydration: Secondary | ICD-10-CM | POA: Diagnosis not present

## 2020-08-17 DIAGNOSIS — J309 Allergic rhinitis, unspecified: Secondary | ICD-10-CM | POA: Diagnosis present

## 2020-08-17 DIAGNOSIS — E876 Hypokalemia: Secondary | ICD-10-CM | POA: Diagnosis present

## 2020-08-17 DIAGNOSIS — N1832 Chronic kidney disease, stage 3b: Secondary | ICD-10-CM | POA: Diagnosis not present

## 2020-08-17 DIAGNOSIS — R55 Syncope and collapse: Secondary | ICD-10-CM | POA: Diagnosis present

## 2020-08-17 DIAGNOSIS — R7303 Prediabetes: Secondary | ICD-10-CM | POA: Diagnosis present

## 2020-08-17 DIAGNOSIS — K589 Irritable bowel syndrome without diarrhea: Secondary | ICD-10-CM | POA: Diagnosis present

## 2020-08-17 DIAGNOSIS — R7989 Other specified abnormal findings of blood chemistry: Secondary | ICD-10-CM | POA: Diagnosis present

## 2020-08-17 DIAGNOSIS — I739 Peripheral vascular disease, unspecified: Secondary | ICD-10-CM | POA: Diagnosis present

## 2020-08-17 DIAGNOSIS — R000 Tachycardia, unspecified: Secondary | ICD-10-CM

## 2020-08-17 DIAGNOSIS — Z888 Allergy status to other drugs, medicaments and biological substances status: Secondary | ICD-10-CM

## 2020-08-17 DIAGNOSIS — R7401 Elevation of levels of liver transaminase levels: Secondary | ICD-10-CM | POA: Diagnosis present

## 2020-08-17 DIAGNOSIS — F410 Panic disorder [episodic paroxysmal anxiety] without agoraphobia: Secondary | ICD-10-CM | POA: Diagnosis present

## 2020-08-17 DIAGNOSIS — R531 Weakness: Secondary | ICD-10-CM | POA: Diagnosis not present

## 2020-08-17 DIAGNOSIS — R778 Other specified abnormalities of plasma proteins: Secondary | ICD-10-CM | POA: Diagnosis not present

## 2020-08-17 DIAGNOSIS — Z881 Allergy status to other antibiotic agents status: Secondary | ICD-10-CM

## 2020-08-17 DIAGNOSIS — R319 Hematuria, unspecified: Secondary | ICD-10-CM | POA: Diagnosis present

## 2020-08-17 DIAGNOSIS — R519 Headache, unspecified: Secondary | ICD-10-CM | POA: Diagnosis not present

## 2020-08-17 DIAGNOSIS — N183 Chronic kidney disease, stage 3 unspecified: Secondary | ICD-10-CM | POA: Diagnosis present

## 2020-08-17 DIAGNOSIS — I10 Essential (primary) hypertension: Secondary | ICD-10-CM

## 2020-08-17 DIAGNOSIS — R69 Illness, unspecified: Secondary | ICD-10-CM | POA: Diagnosis not present

## 2020-08-17 DIAGNOSIS — Z79899 Other long term (current) drug therapy: Secondary | ICD-10-CM

## 2020-08-17 DIAGNOSIS — F419 Anxiety disorder, unspecified: Secondary | ICD-10-CM | POA: Diagnosis present

## 2020-08-17 DIAGNOSIS — R0689 Other abnormalities of breathing: Secondary | ICD-10-CM | POA: Diagnosis not present

## 2020-08-17 DIAGNOSIS — I129 Hypertensive chronic kidney disease with stage 1 through stage 4 chronic kidney disease, or unspecified chronic kidney disease: Secondary | ICD-10-CM | POA: Diagnosis present

## 2020-08-17 DIAGNOSIS — Z90722 Acquired absence of ovaries, bilateral: Secondary | ICD-10-CM

## 2020-08-17 DIAGNOSIS — R5381 Other malaise: Secondary | ICD-10-CM | POA: Diagnosis not present

## 2020-08-17 LAB — LACTIC ACID, PLASMA: Lactic Acid, Venous: 1.2 mmol/L (ref 0.5–1.9)

## 2020-08-17 LAB — CBC
HCT: 41.2 % (ref 36.0–46.0)
Hemoglobin: 13.5 g/dL (ref 12.0–15.0)
MCH: 30.1 pg (ref 26.0–34.0)
MCHC: 32.8 g/dL (ref 30.0–36.0)
MCV: 91.8 fL (ref 80.0–100.0)
Platelets: 294 10*3/uL (ref 150–400)
RBC: 4.49 MIL/uL (ref 3.87–5.11)
RDW: 16 % — ABNORMAL HIGH (ref 11.5–15.5)
WBC: 6 10*3/uL (ref 4.0–10.5)
nRBC: 0 % (ref 0.0–0.2)

## 2020-08-17 LAB — BASIC METABOLIC PANEL
Anion gap: 16 — ABNORMAL HIGH (ref 5–15)
BUN: 37 mg/dL — ABNORMAL HIGH (ref 8–23)
CO2: 18 mmol/L — ABNORMAL LOW (ref 22–32)
Calcium: 10.1 mg/dL (ref 8.9–10.3)
Chloride: 104 mmol/L (ref 98–111)
Creatinine, Ser: 1.31 mg/dL — ABNORMAL HIGH (ref 0.44–1.00)
GFR calc Af Amer: 48 mL/min — ABNORMAL LOW (ref >60)
GFR calc non Af Amer: 41 mL/min — ABNORMAL LOW (ref >60)
Glucose, Bld: 111 mg/dL — ABNORMAL HIGH (ref 70–99)
Potassium: 3.1 mmol/L — ABNORMAL LOW (ref 3.5–5.1)
Sodium: 138 mmol/L (ref 135–145)

## 2020-08-17 LAB — LIPASE, BLOOD: Lipase: 197 U/L — ABNORMAL HIGH (ref 11–51)

## 2020-08-17 LAB — HEPATIC FUNCTION PANEL
ALT: 80 U/L — ABNORMAL HIGH (ref 0–44)
AST: 116 U/L — ABNORMAL HIGH (ref 15–41)
Albumin: 4.6 g/dL (ref 3.5–5.0)
Alkaline Phosphatase: 68 U/L (ref 38–126)
Bilirubin, Direct: 0.3 mg/dL — ABNORMAL HIGH (ref 0.0–0.2)
Indirect Bilirubin: 1.2 mg/dL — ABNORMAL HIGH (ref 0.3–0.9)
Total Bilirubin: 1.5 mg/dL — ABNORMAL HIGH (ref 0.3–1.2)
Total Protein: 9 g/dL — ABNORMAL HIGH (ref 6.5–8.1)

## 2020-08-17 LAB — TROPONIN I (HIGH SENSITIVITY)
Troponin I (High Sensitivity): 146 ng/L (ref <18)
Troponin I (High Sensitivity): 78 ng/L — ABNORMAL HIGH (ref <18)
Troponin I (High Sensitivity): 75 ng/L — ABNORMAL HIGH (ref <18)
Troponin I (High Sensitivity): 78 ng/L — ABNORMAL HIGH (ref <18)

## 2020-08-17 LAB — TSH: TSH: 0.82 u[IU]/mL (ref 0.350–4.500)

## 2020-08-17 LAB — PHOSPHORUS: Phosphorus: 3.4 mg/dL (ref 2.5–4.6)

## 2020-08-17 LAB — MAGNESIUM
Magnesium: 2.4 mg/dL (ref 1.7–2.4)
Magnesium: 2.2 mg/dL (ref 1.7–2.4)

## 2020-08-17 LAB — CK: Total CK: 1086 U/L — ABNORMAL HIGH (ref 38–234)

## 2020-08-17 LAB — CBG MONITORING, ED: Glucose-Capillary: 87 mg/dL (ref 70–99)

## 2020-08-17 LAB — AMMONIA: Ammonia: 35 umol/L (ref 9–35)

## 2020-08-17 LAB — ETHANOL: Alcohol, Ethyl (B): <10 mg/dL (ref <10)

## 2020-08-17 LAB — D-DIMER, QUANTITATIVE: D-Dimer, Quant: 0.5 ug/mL-FEU (ref 0.00–0.50)

## 2020-08-17 LAB — SARS CORONAVIRUS 2 BY RT PCR (HOSPITAL ORDER, PERFORMED IN ~~LOC~~ HOSPITAL LAB): SARS Coronavirus 2: NEGATIVE

## 2020-08-17 IMAGING — US US ABDOMEN LIMITED
1 series · 14 of 25 positions shown · non-contrast
Comparison: [DATE]

CLINICAL DATA: Right upper quadrant pain, nausea and vomiting

EXAM:
ULTRASOUND ABDOMEN LIMITED RIGHT UPPER QUADRANT

[Series 1: us abdomen limited · 14 of 29 slices shown]
[im 1/29]
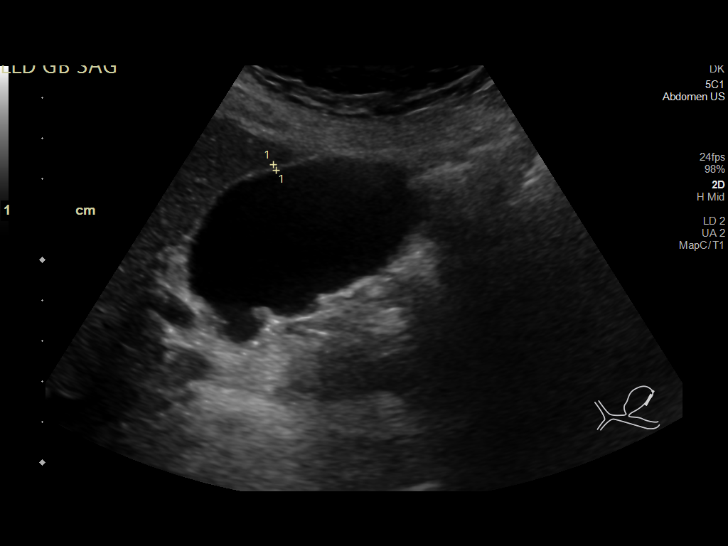
[im 3/29]
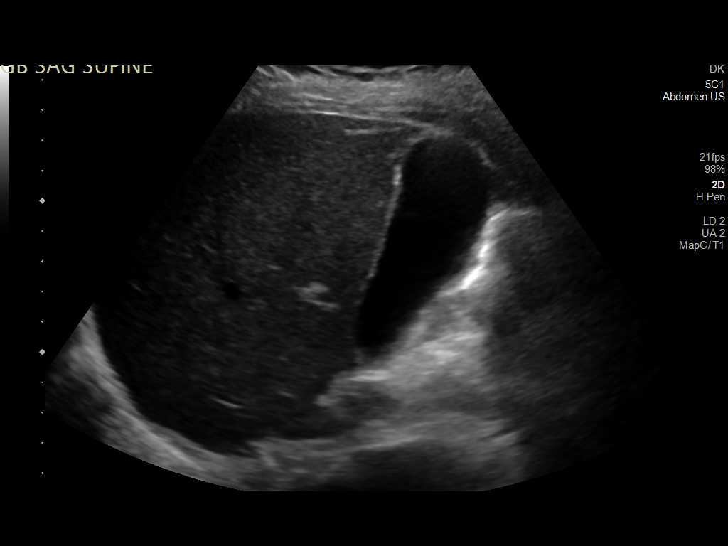
[im 5/29]
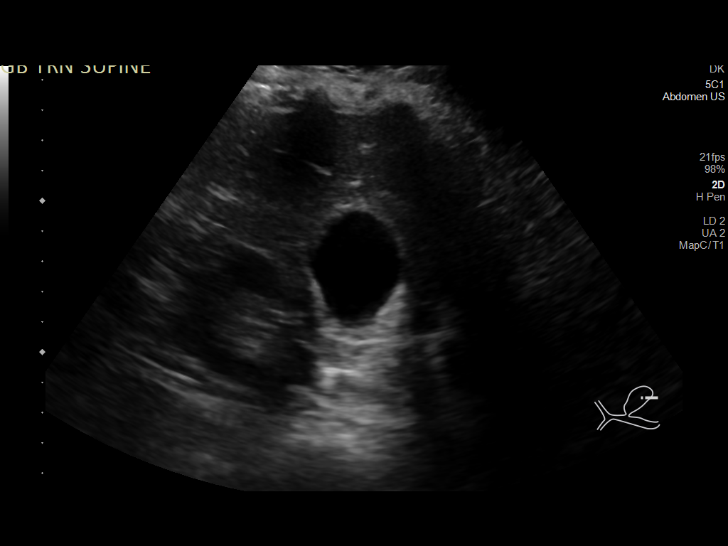
[im 8/29]
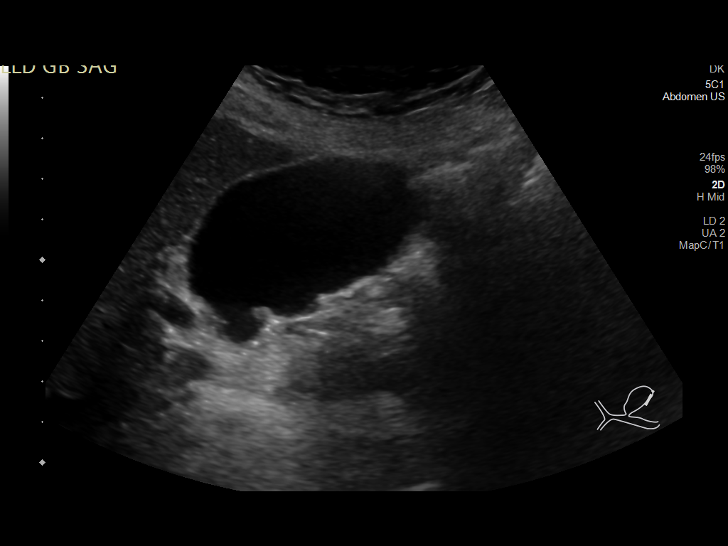
[im 10/29]
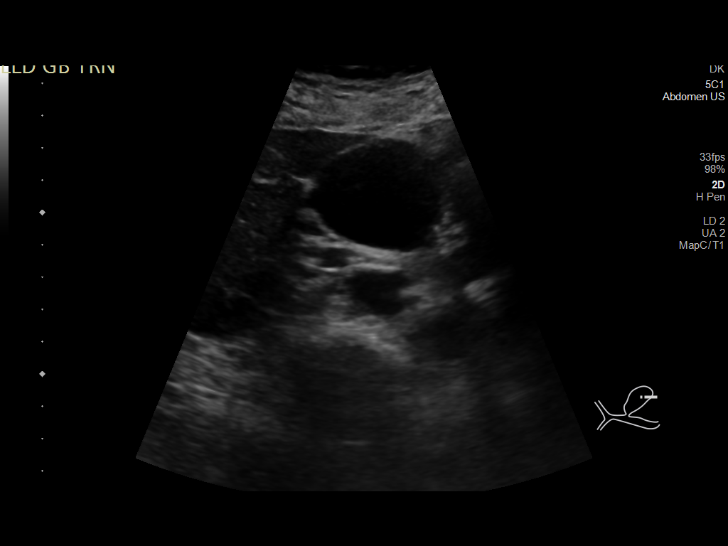
[im 11/29]
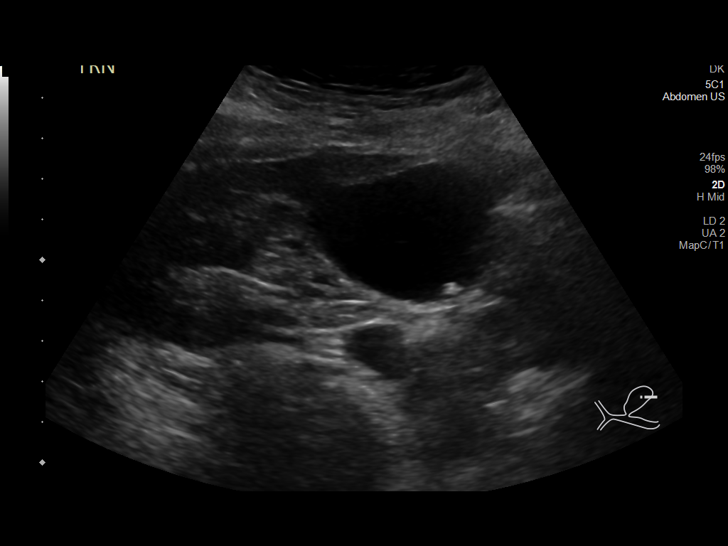
[im 13/29]
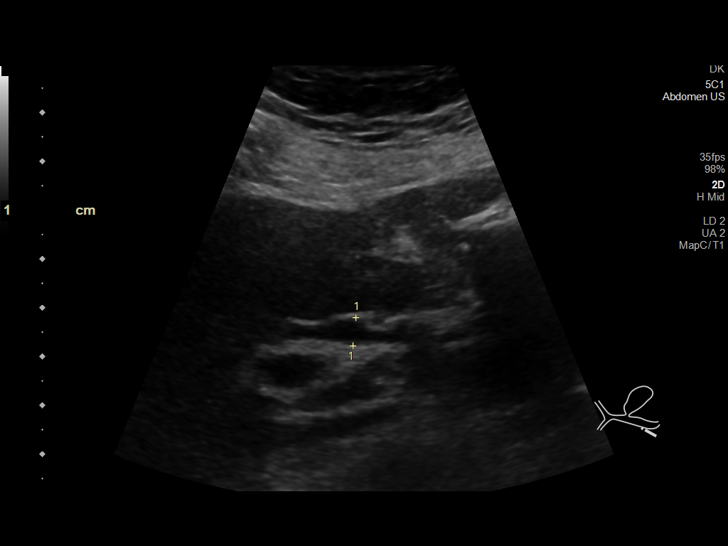
[im 16/29]
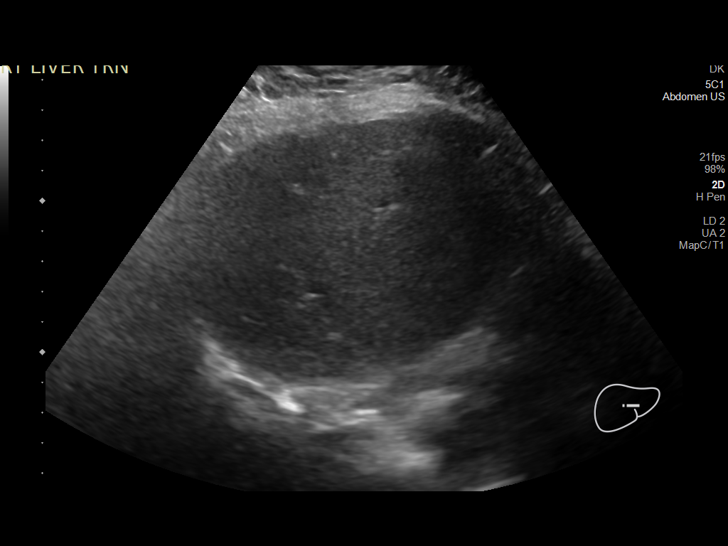
[im 18/29]
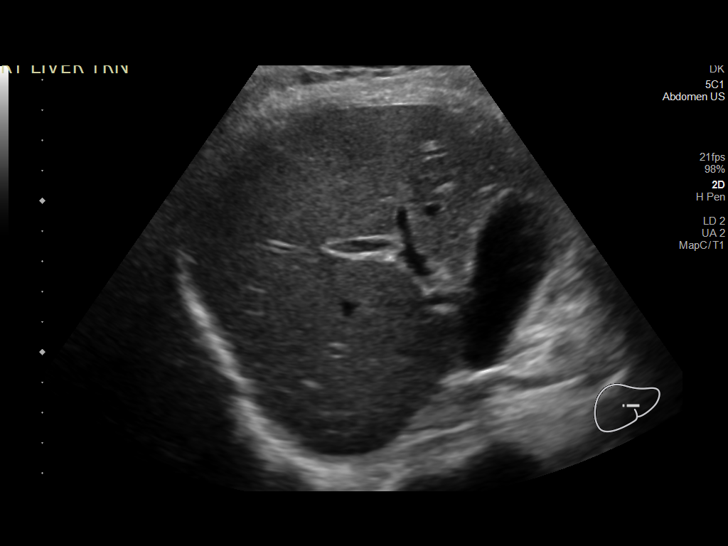
[im 19/29]
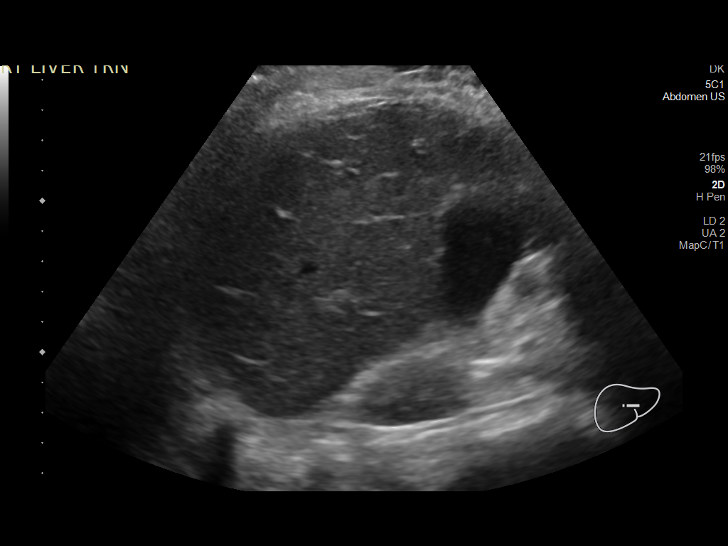
[im 22/29]
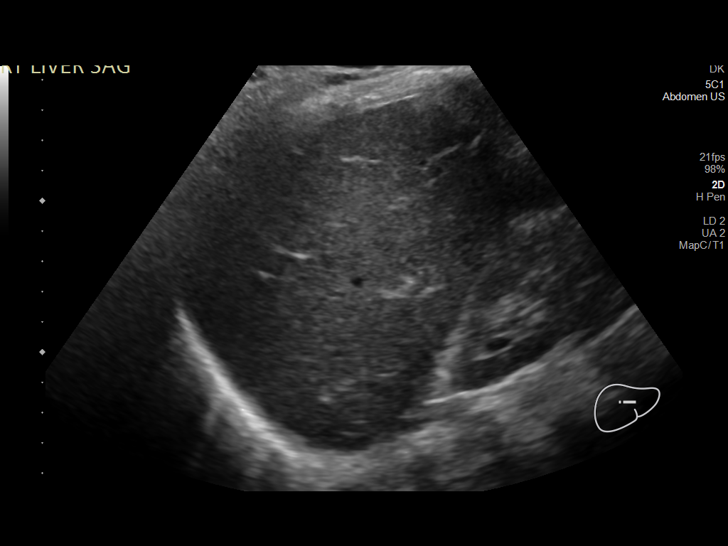
[im 24/29]
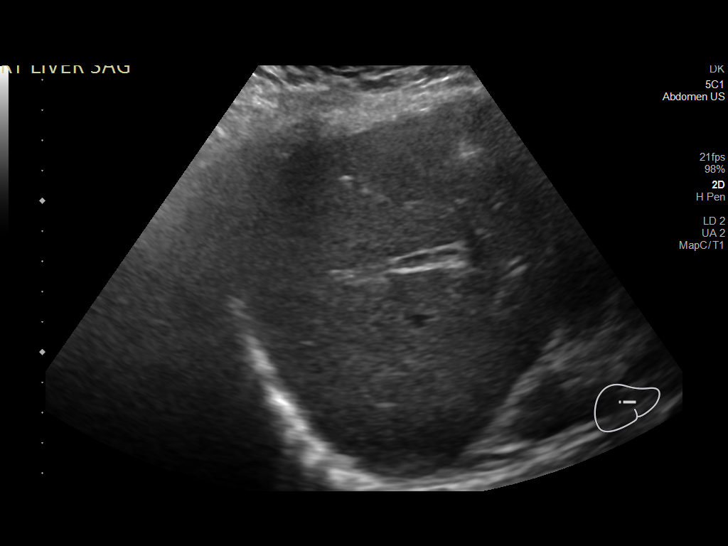
[im 26/29]
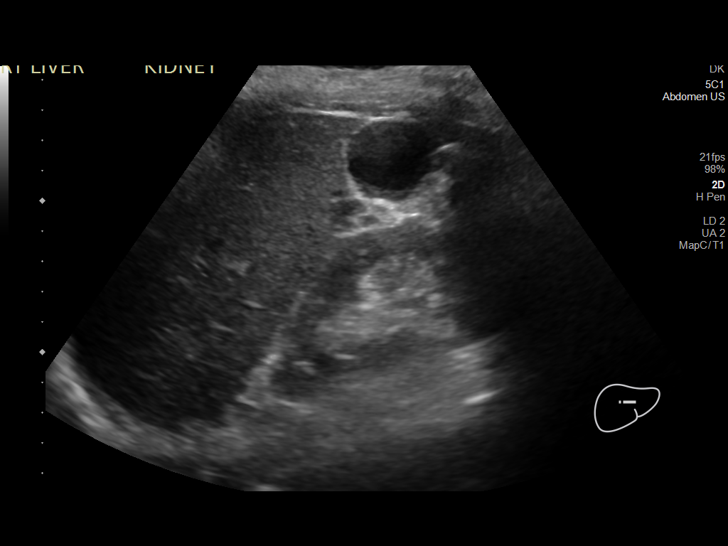
[im 29/29]
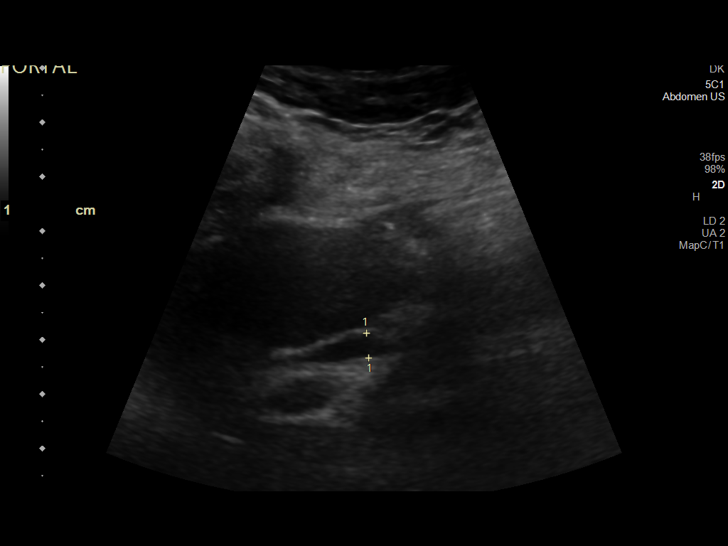

[14 of 25 positions shown; findings below may reference images not displayed]

FINDINGS: Gallbladder:

Small nonshadowing gallstones versus tumefactive sludge noted within
the gallbladder lumen. No evidence of shadowing gallstones or
cholecystitis. No gallbladder wall thickening or pericholecystic
fluid.

Common bile duct:

Diameter: 6 mm

Liver:

No focal lesion identified. Within normal limits in parenchymal
echogenicity. Portal vein is patent on color Doppler imaging with
normal direction of blood flow towards the liver.

Other: None.
IMPRESSION: 1. Small nonshadowing gallstones versus tumefactive sludge. No
evidence of cholecystitis.

## 2020-08-17 IMAGING — CT CT HEAD W/O CM
4 series · 16 of 47 positions shown, 18 images · non-contrast
Comparison: None.

CLINICAL DATA: Syncope, headache

EXAM:
CT HEAD WITHOUT CONTRAST
TECHNIQUE: Contiguous axial images were obtained from the base of the skull
through the vertex without intravenous contrast.

[Series 3: head without · axial · non-contrast · 0.39mm/px · z∈[-119,-19]mm · 7 of 28 slices shown, 9 images]
[im 4/28  brain]
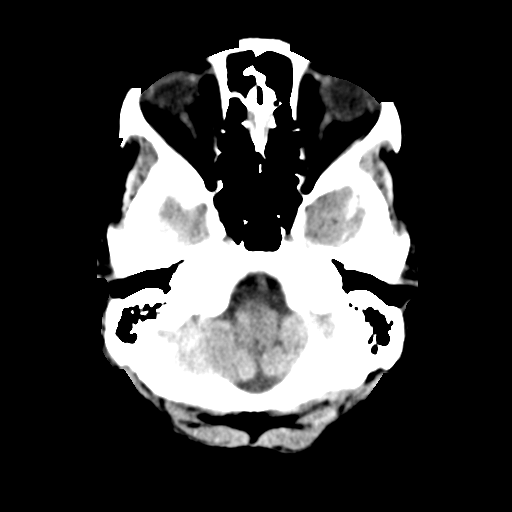
[im 4/28  bone]
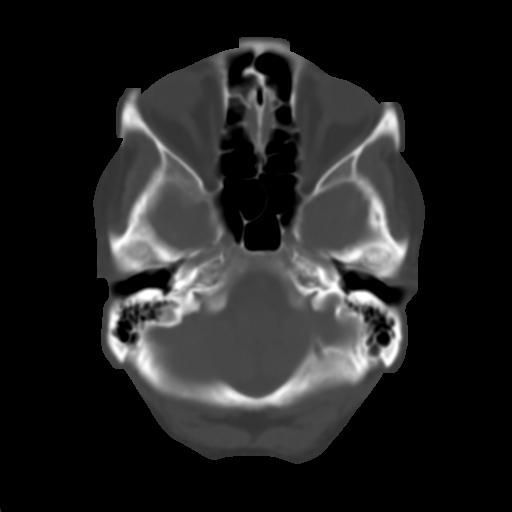
[im 7/28  brain]
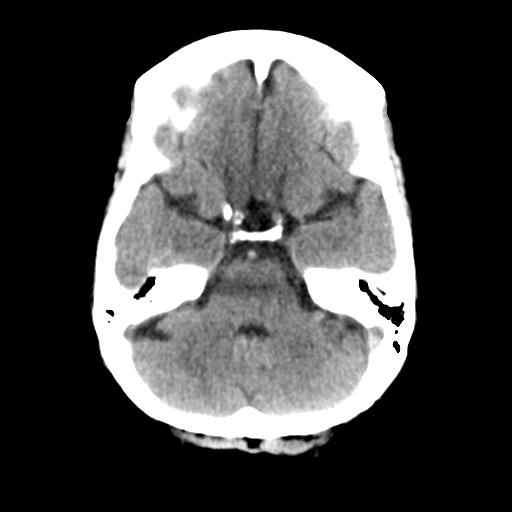
[im 11/28  brain]
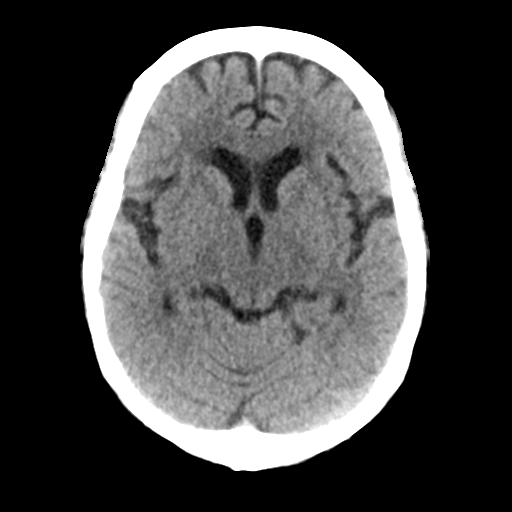
[im 14/28  brain]
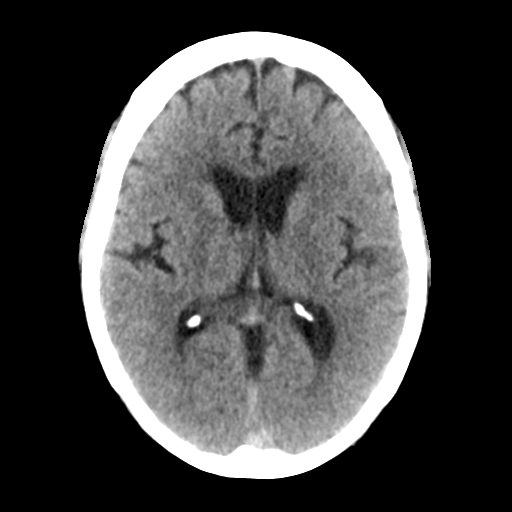
[im 17/28  brain]
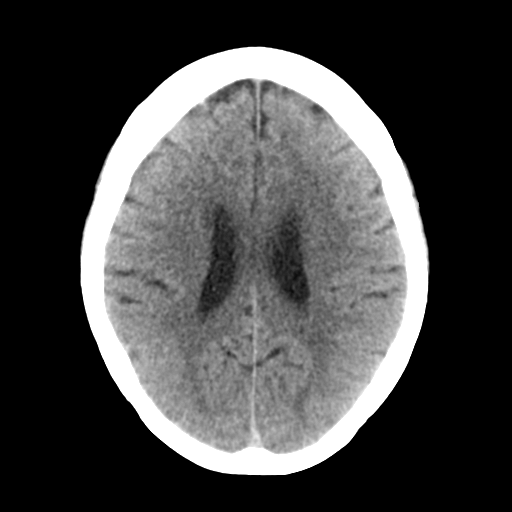
[im 17/28  bone]
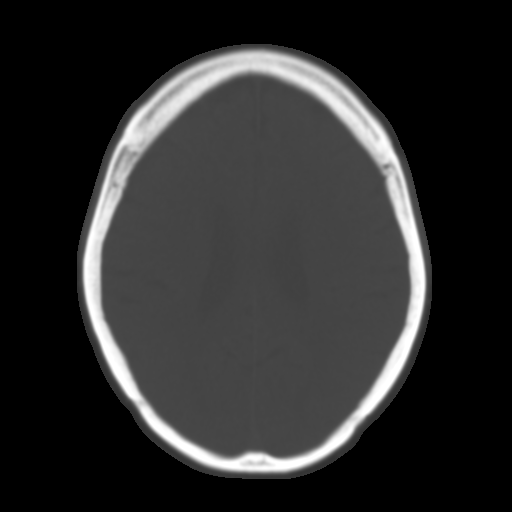
[im 21/28  brain]
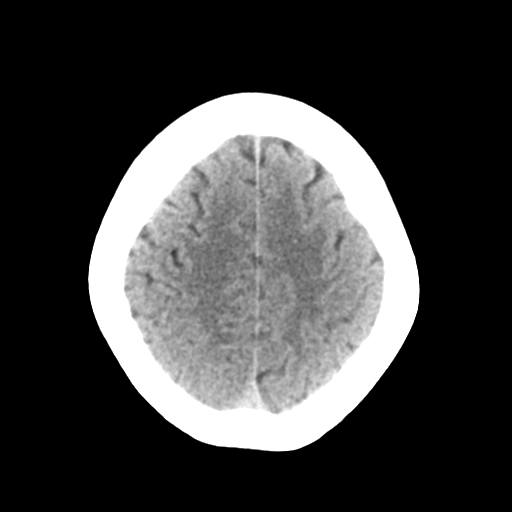
[im 24/28  brain]
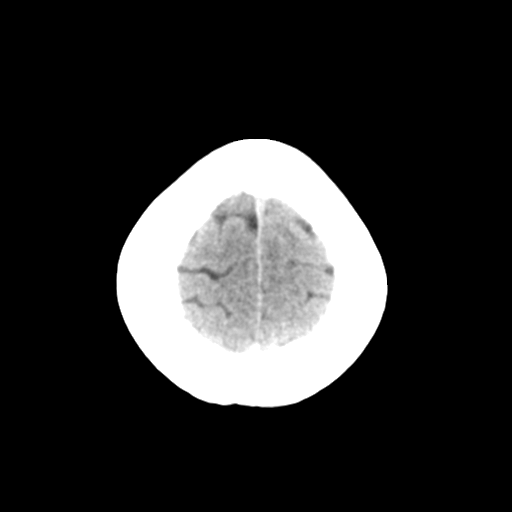

[Series 4: head bone · axial · 0.39mm/px · z∈[-122,-94]mm · 3 of 69 slices shown]
[im 7/69  bone]
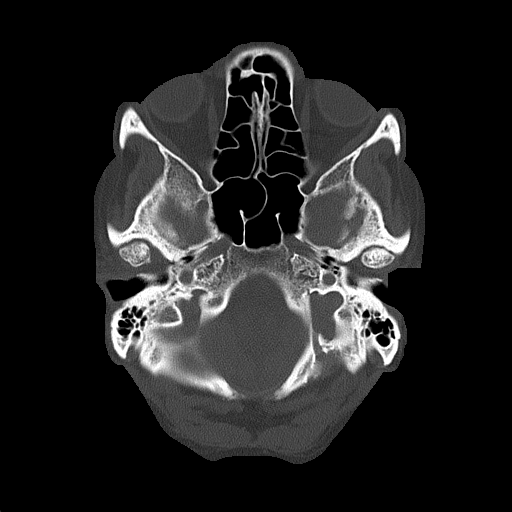
[im 14/69  bone]
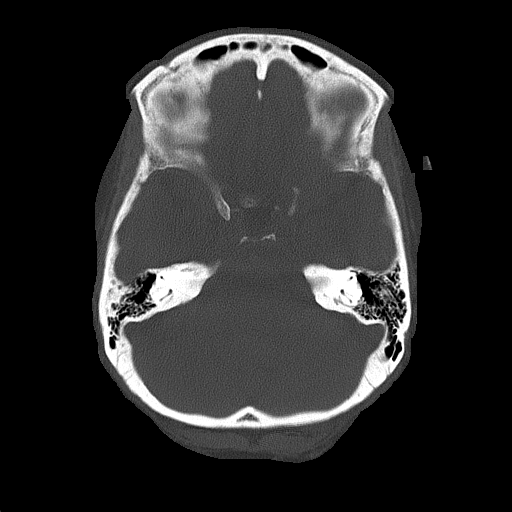
[im 21/69  bone]
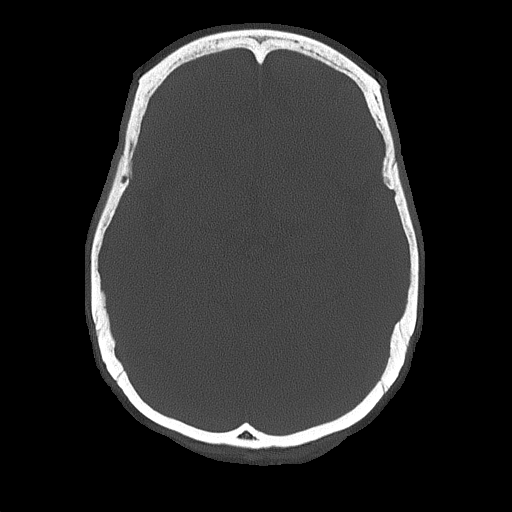

[Series 5: head without cor · coronal · non-contrast · 0.28mm/px · 3 of 60 slices shown]
[im 20/60  brain]
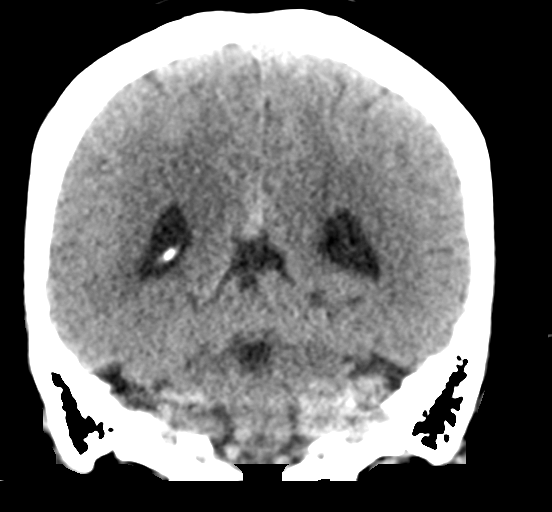
[im 27/60  brain]
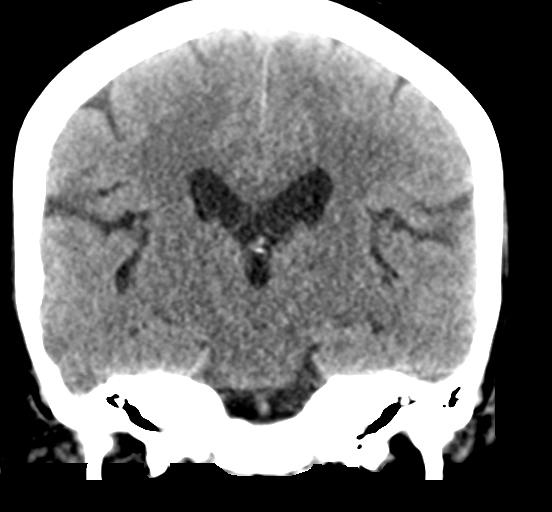
[im 33/60  brain]
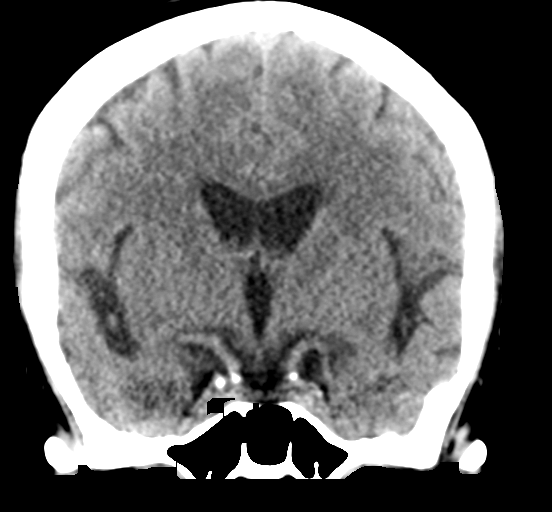

[Series 6: head without sag · sagittal · non-contrast · 0.29mm/px · 3 of 47 slices shown]
[im 16/47  brain]
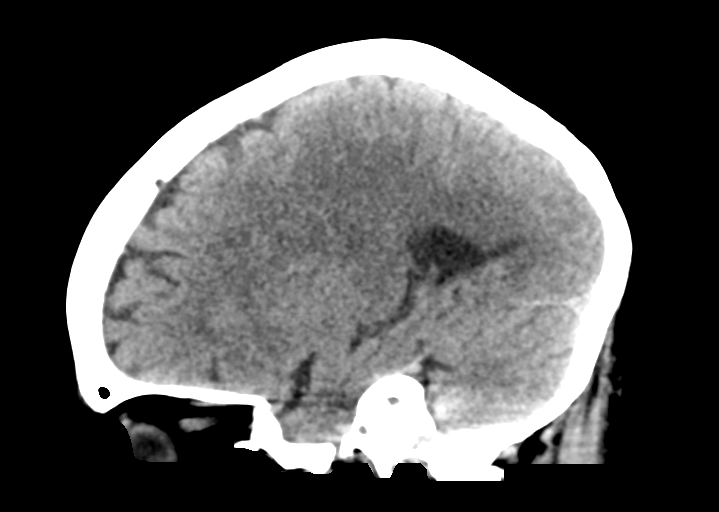
[im 24/47  brain]
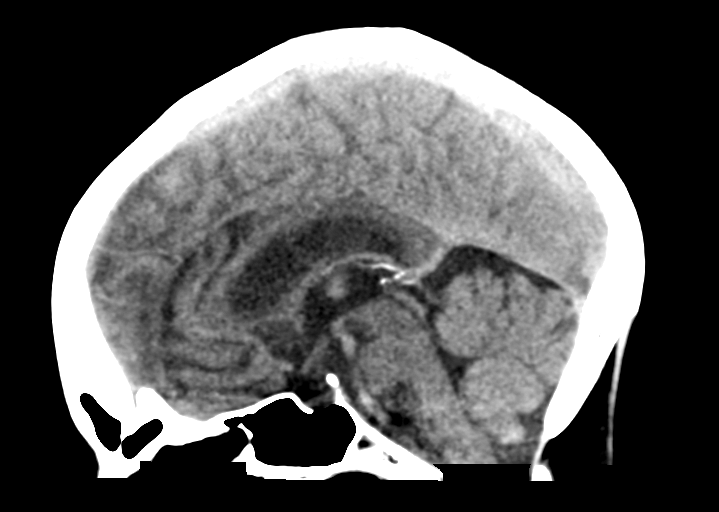
[im 31/47  brain]
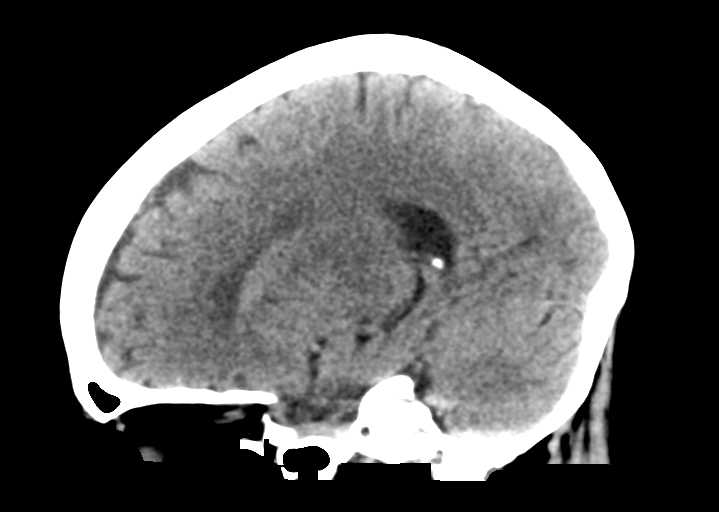

[16 of 47 positions shown; findings below may reference images not displayed]

FINDINGS: Brain: No evidence of acute infarction, hemorrhage, hydrocephalus,
extra-axial collection or mass lesion/mass effect.

Vascular: Atherosclerotic calcifications involving the large vessels
of the skull base. No unexpected hyperdense vessel.

Skull: Normal. Negative for fracture or focal lesion.

Sinuses/Orbits: No acute finding.

Other: None.
IMPRESSION: No acute intracranial findings.

## 2020-08-17 IMAGING — DX DG CHEST 1V PORT
1 series · 1 of 1 positions shown · non-contrast
Comparison: [DATE]

CLINICAL DATA: Weakness, tachycardia, nausea and vomiting

EXAM:
PORTABLE CHEST 1 VIEW

[chest]
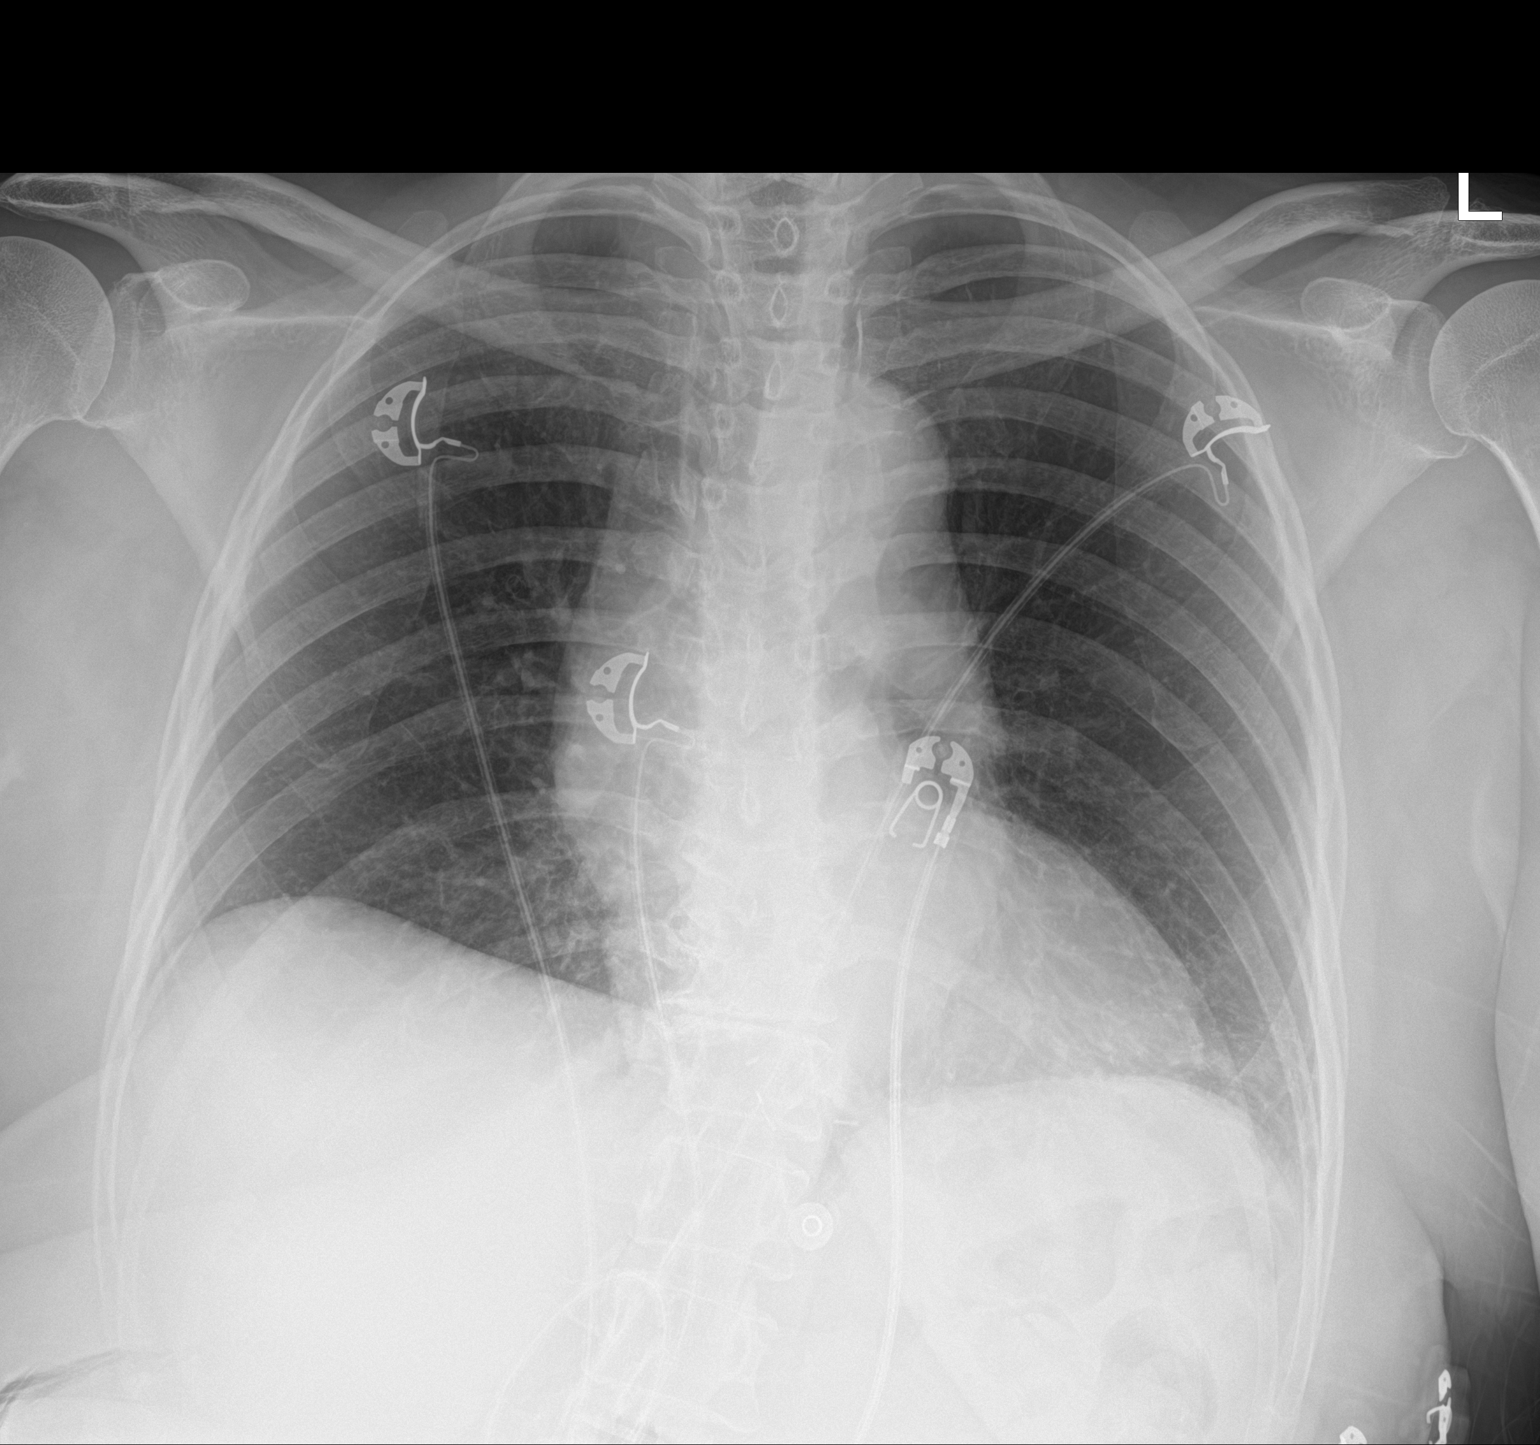

[1 of 1 positions shown; findings below may reference images not displayed]

FINDINGS: Single frontal view of the chest demonstrates a stable cardiac
silhouette. There is stable ectasia of the thoracic aorta. No
airspace disease, effusion, or pneumothorax. No acute bony
abnormality.
IMPRESSION: 1. No acute intrathoracic process.

## 2020-08-17 MED ORDER — LORAZEPAM 2 MG/ML IJ SOLN
1.0000 mg | Freq: Once | INTRAMUSCULAR | Status: AC
  Administered 2020-08-17: 1 mg via INTRAVENOUS
  Filled 2020-08-17: qty 1

## 2020-08-17 MED ORDER — LACTATED RINGERS IV BOLUS
1000.0000 mL | Freq: Once | INTRAVENOUS | Status: AC
  Administered 2020-08-17: 1000 mL via INTRAVENOUS

## 2020-08-17 MED ORDER — ACETAMINOPHEN 500 MG PO TABS
1000.0000 mg | ORAL_TABLET | Freq: Once | ORAL | Status: AC
  Administered 2020-08-17: 1000 mg via ORAL
  Filled 2020-08-17: qty 2

## 2020-08-17 MED ORDER — LORAZEPAM 1 MG PO TABS: 1.0000 mg | ORAL_TABLET | ORAL | Status: DC | PRN

## 2020-08-17 MED ORDER — SODIUM CHLORIDE 0.9 % IV SOLN: INTRAVENOUS | Status: DC

## 2020-08-17 MED ORDER — SODIUM CHLORIDE 0.9 % IV BOLUS
1000.0000 mL | Freq: Once | INTRAVENOUS | Status: AC
  Administered 2020-08-17: 1000 mL via INTRAVENOUS

## 2020-08-17 MED ORDER — ADULT MULTIVITAMIN W/MINERALS CH
1.0000 | ORAL_TABLET | Freq: Every day | ORAL | Status: DC
  Administered 2020-08-18 – 2020-08-19 (×2): 1 via ORAL
  Filled 2020-08-17 (×2): qty 1

## 2020-08-17 MED ORDER — INSULIN ASPART 100 UNIT/ML ~~LOC~~ SOLN: 0.0000 [IU] | SUBCUTANEOUS | Status: DC

## 2020-08-17 MED ORDER — POTASSIUM CHLORIDE CRYS ER 20 MEQ PO TBCR
40.0000 meq | EXTENDED_RELEASE_TABLET | Freq: Once | ORAL | Status: AC
  Administered 2020-08-17: 40 meq via ORAL
  Filled 2020-08-17: qty 2

## 2020-08-17 MED ORDER — LORAZEPAM 2 MG/ML IJ SOLN
1.0000 mg | INTRAMUSCULAR | Status: DC | PRN
  Administered 2020-08-18: 2 mg via INTRAVENOUS
  Filled 2020-08-17: qty 1

## 2020-08-17 NOTE — ED Notes (Signed)
Trop 78

## 2020-08-17 NOTE — ED Notes (Signed)
Pt has had covid vaccine

## 2020-08-17 NOTE — ED Provider Notes (Signed)
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   Patient continues to deny any current or recent chest pain or discomfort. Abd is soft non tender. Additional ivf ordered as appears volume depleted. Pt also asking for additional bzd med - provided. Patients heart rate is improved from prior, sinus. No sob or increased wob. Pending labs, ddimer and tsh within normal limits.   Will consult medicine service for admission re nv, dehydration, tachycardia with elev trop, possible bzd withdrawal symptoms - for additional fluids, observation, trending trop, expectant management.      Lajean Saver, MD 08/17/20 1950

## 2020-08-17 NOTE — ED Notes (Signed)
Pt reports having a headache and ask for something for pain notified MD for tylenol, orders have been received

## 2020-08-17 NOTE — ED Notes (Signed)
Got patient into a gown on the monitor patient is resting with family at bedside and call bell in reach 

## 2020-08-17 NOTE — ED Triage Notes (Signed)
The pt arrived  [facility] from home the pt has had nausea vomiting since yesterday  She has had panic attacks with fast breathing and her husband gave her  A muscle relaxer  No pain no diarrhea

## 2020-08-17 NOTE — ED Notes (Signed)
Patient transported to Ultrasound 

## 2020-08-17 NOTE — ED Provider Notes (Signed)
[facility] DEPARTMENT Provider Note   CSN: [id number] Arrival date & time: 08/17/20  0107     History Chief Complaint  Patient presents with  . Loss of Consciousness    Michaela Rios is a 69 y.o. female.  69 year old  hypertension, PVD, migraines, anxiety, GERD who p/w vomiting.  Patient states that she began having nausea and vomiting 3 days ago and has not been able to tolerate any medications since that time.  She denies any associated pain whatsoever including no abdominal pain or chest pain.  She reports maybe a small amount of shortness of breath, no significant cough.  No diarrhea or urinary symptoms.  Her husband notes significant tremors/shaking which is what prompted them to come to the ED.  She normally takes Ativan twice daily but has not had this medication in 3 days.  She denies any alcohol use.  No sick contacts.  The history is provided by the patient and the spouse.  Loss of Consciousness      Past Medical History:  Diagnosis Date  . Anemia   . Anxiety   . Arthritis   . Cataract   . Chronic migraine    follows with neuro for same  . Clotting disorder (HCC)    clot in ovarian vein- hx  . Esophagitis   . GERD (gastroesophageal reflux disease)   . Hypertension   . IBS (irritable bowel syndrome)   . Low back pain syndrome   . MRSA (methicillin resistant Staphylococcus aureus) infection 04/17/12   left torso  . Peptic ulcer disease 2003, 12/2012  . Peripheral vascular disease ([place])   . Vitamin D deficiency     Patient Active Problem List   Diagnosis Date Noted  . Tinea corporis 06/30/2020  . Herpes zoster without complication 28/76/8115  . Urinary urgency 04/06/2020  . Left lumbar radiculopathy 06/04/2018  . CKD (chronic kidney disease) stage 3, GFR 30-59 ml/min 06/04/2018  . Pain management contract signed 06/03/2018  . Encounter for pain management 03/05/2018  . Memory difficulties 03/05/2018  . Hormone replacement  therapy (HRT) 12/04/2017  . Sialadenitis, right parotid 06/24/2017  . Insomnia 05/12/2017  . Prediabetes 11/08/2016  . Vitamin D deficiency 06/08/2009  . Depression 01/07/2009  . PEPTIC ULCER DISEASE, CHRONIC 01/07/2009  . OTHER OBSTRUCTION OF DUODENUM 01/07/2009  . HIATAL HERNIA 01/07/2009  . Irritable bowel syndrome 01/07/2009  . DEGENERATIVE JOINT DISEASE 01/07/2009  . ANEMIA, HX OF 01/07/2009  . PULMONARY NODULE 06/09/2008  . Anxiety 04/01/2008  . Essential hypertension 04/01/2008  . Allergic rhinitis 04/01/2008  . GERD 04/01/2008  . LOW BACK PAIN SYNDROME 04/01/2008  . Chronic migraine 04/01/2008    Past Surgical History:  Procedure Laterality Date  . ABDOMINAL HYSTERECTOMY    . LAPAROSCOPIC BILATERAL SALPINGO OOPHERECTOMY    . pud surgery w/duod sticture-plasty and vagotomy  2003   Dr. Marlou Starks  . TONSILLECTOMY AND ADENOIDECTOMY     as a child  . TUBAL LIGATION  1984     OB History   No obstetric history on file.     Family History  Problem Relation Age of Onset  . Aneurysm Father   . Prostate cancer Brother   . Lung cancer Brother   . Colon cancer Neg Hx   . Esophageal cancer Neg Hx   . Rectal cancer Neg Hx   . Stomach cancer Neg Hx     Social History   Tobacco Use  . Smoking status: Never Smoker  .  Smokeless tobacco: Never Used  Vaping Use  . Vaping Use: Never used  Substance Use Topics  . Alcohol use: Yes    Comment: rare  . Drug use: No    Home Medications Prior to Admission medications   Medication Sig Start Date End Date Taking? Authorizing Provider  amLODipine (NORVASC) 5 MG tablet Take 1 tablet (5 mg total) by mouth daily. 05/15/20   Binnie Rail, MD  buPROPion (WELLBUTRIN XL) 300 MG 24 hr tablet Take 1 tablet (300 mg total) by mouth daily. 07/29/20   Binnie Rail, MD  calcium-vitamin D (OSCAL 500/200 D-3) 500-200 MG-UNIT per tablet Take 1 tablet by mouth twice daily     [provider]  ciclopirox (LOPROX) 0.77 % cream Apply  topically 2 (two) times daily. 06/30/20   Binnie Rail, MD  cyclobenzaprine (FLEXERIL) 10 MG tablet Take 1 tablet (10 mg total) by mouth 3 (three) times daily as needed for muscle spasms. 07/02/20   Binnie Rail, MD  EPINEPHrine (EPIPEN 2-PAK) 0.3 mg/0.3 mL IJ SOAJ injection Inject 0.3 mLs (0.3 mg total) into the muscle as needed for anaphylaxis. 08/15/19   Binnie Rail, MD  furosemide (LASIX) 20 MG tablet Take 1 tablet (20 mg total) by mouth daily. 05/15/20   Binnie Rail, MD  gabapentin (NEURONTIN) 100 MG capsule Take 100-200 mg in morning and 400 mg at bedtime 07/29/20   Binnie Rail, MD  hydrALAZINE (APRESOLINE) 25 MG tablet Take 1 tablet (25 mg total) by mouth 3 (three) times daily. Patient taking differently: Take 25 mg by mouth in the morning and at bedtime.  05/15/20   Binnie Rail, MD  HYDROcodone-acetaminophen (NORCO) 7.5-325 MG tablet Take 1 tablet by mouth 3 (three) times daily as needed for severe pain (headaches). 08/14/20   Burns, Claudina Lick, MD  LORazepam (ATIVAN) 1 MG tablet Take 1 tablet (1 mg total) by mouth 2 (two) times daily. 08/14/20   Binnie Rail, MD  metoprolol succinate (TOPROL-XL) 100 MG 24 hr tablet Take 1 tablet (100 mg total) by mouth daily. Take with or immediately following a meal. 05/15/20   Burns, Claudina Lick, MD  omeprazole (PRILOSEC) 20 MG capsule Take 1 capsule (20 mg total) by mouth daily. 05/15/20   Binnie Rail, MD  SUMAtriptan (IMITREX) 100 MG tablet Take 1 tablet by mouth  every 2 hours as needed for migraine. Max 2 tablets in  24 hours. 04/28/15 04/27/16  [provider]  SUMAtriptan (IMITREX) 20 MG/ACT nasal spray Place 1 spray into the nose. 12/17/14 12/17/15  [provider]  tiZANidine (ZANAFLEX) 4 MG tablet  11/04/17   [provider]  topiramate (TOPAMAX) 50 MG tablet Take 1.5 tablets by mouth daily.    [provider]  valACYclovir (VALTREX) 1000 MG tablet Take 1 tablet (1,000 mg total) by mouth 3 (three) times daily. 06/30/20    Binnie Rail, MD  venlafaxine XR (EFFEXOR-XR) 75 MG 24 hr capsule Take 225 mg by mouth. 12/17/14 12/17/15  [provider]  zolpidem (AMBIEN) 10 MG tablet TAKE 1 TABLET BY MOUTH AT BEDTIME AS NEEDED FOR SLEEP 07/20/20   Binnie Rail, MD    Allergies    Amoxicillin, Cephalexin, and Lisinopril  Review of Systems   Review of Systems  Cardiovascular: Positive for syncope.   All other systems reviewed and are negative except that which was mentioned in HPI  Physical Exam Updated Vital Signs BP (!) 152/110   Pulse Marland Kitchen)  126   Temp 98.7 F (37.1 C) (Oral)   Resp 15   Ht 5\' 3"  (1.6 m)   Wt 64.4 kg   SpO2 96%   BMI 25.15 kg/m   Physical Exam Vitals and nursing note reviewed.  Constitutional:      General: She is not in acute distress.    Appearance: She is well-developed.     Comments: anxious  HENT:     Head: Normocephalic and atraumatic.  Eyes:     Conjunctiva/sclera: Conjunctivae normal.  Cardiovascular:     Rate and Rhythm: Regular rhythm. Tachycardia present.     Heart sounds: Normal heart sounds. No murmur heard.   Pulmonary:     Effort: Pulmonary effort is normal.     Breath sounds: Normal breath sounds.  Abdominal:     General: Bowel sounds are normal. There is no distension.     Palpations: Abdomen is soft.     Tenderness: There is no abdominal tenderness.  Musculoskeletal:     Cervical back: Neck supple.  Skin:    General: Skin is warm and dry.  Neurological:     Mental Status: She is alert and oriented to person, place, and time.     Comments: Fluent speech, tremulous  Psychiatric:        Judgment: Judgment normal.     Comments: anxious     ED Results / Procedures / Treatments   Labs (all labs ordered are listed, but only abnormal results are displayed) Labs Reviewed  BASIC METABOLIC PANEL - Abnormal; Notable for the following components:      Result Value   Potassium 3.1 (*)    CO2 18 (*)    Glucose, Bld 111 (*)    BUN 37 (*)     Creatinine, Ser 1.31 (*)    GFR calc non Af Amer 41 (*)    GFR calc Af Amer 48 (*)    Anion gap 16 (*)    All other components within normal limits  CBC - Abnormal; Notable for the following components:   RDW 16.0 (*)    All other components within normal limits  LIPASE, BLOOD - Abnormal; Notable for the following components:   Lipase 197 (*)    All other components within normal limits  HEPATIC FUNCTION PANEL - Abnormal; Notable for the following components:   Total Protein 9.0 (*)    AST 116 (*)    ALT 80 (*)    Total Bilirubin 1.5 (*)    Bilirubin, Direct 0.3 (*)    Indirect Bilirubin 1.2 (*)    All other components within normal limits  TROPONIN I (HIGH SENSITIVITY) - Abnormal; Notable for the following components:   Troponin I (High Sensitivity) 146 (*)    All other components within normal limits  TROPONIN I (HIGH SENSITIVITY) - Abnormal; Notable for the following components:   Troponin I (High Sensitivity) 78 (*)    All other components within normal limits  TROPONIN I (HIGH SENSITIVITY) - Abnormal; Notable for the following components:   Troponin I (High Sensitivity) 75 (*)    All other components within normal limits  SARS CORONAVIRUS 2 BY RT PCR [facility] ORDER, [place] [facility])  MAGNESIUM    EKG EKG Interpretation  Date/Time:  Monday August 17 2020 11:35:21 EDT Ventricular Rate:  136 PR Interval:    QRS Duration: 84 QT Interval:  299 QTC Calculation: 450 R Axis:   -5 Text Interpretation: Sinus tachycardia Probable left atrial  enlargement Probable anteroseptal infarct, old Repol abnrm suggests ischemia, diffuse leads tachycardia and diffuse ST depression new from previous Confirmed by Theotis Burrow [id number]) on 08/17/2020 11:39:15 AM   Radiology CT Head Wo Contrast  Result Date: 08/17/2020 CLINICAL DATA:  Syncope, headache EXAM: CT HEAD WITHOUT CONTRAST TECHNIQUE: Contiguous axial images were obtained from the base of the skull through  the vertex without intravenous contrast. COMPARISON:  None. FINDINGS: Brain: No evidence of acute infarction, hemorrhage, hydrocephalus, extra-axial collection or mass lesion/mass effect. Vascular: Atherosclerotic calcifications involving the large vessels of the skull base. No unexpected hyperdense vessel. Skull: Normal. Negative for fracture or focal lesion. Sinuses/Orbits: No acute finding. Other: None. IMPRESSION: No acute intracranial findings. Electronically Signed   By: Davina Poke D.O.   On: 08/17/2020 13:04   US Abdomen Limited  Result Date: 08/17/2020 CLINICAL DATA:  Right upper quadrant pain, nausea and vomiting EXAM: ULTRASOUND ABDOMEN LIMITED RIGHT UPPER QUADRANT COMPARISON:  02/03/2011 FINDINGS: Gallbladder: Small nonshadowing gallstones versus tumefactive sludge noted within the gallbladder lumen. No evidence of shadowing gallstones or cholecystitis. No gallbladder wall thickening or pericholecystic fluid. Common bile duct: Diameter: 6 mm Liver: No focal lesion identified. Within normal limits in parenchymal echogenicity. Portal vein is patent on color Doppler imaging with normal direction of blood flow towards the liver. Other: None. IMPRESSION: 1. Small nonshadowing gallstones versus tumefactive sludge. No evidence of cholecystitis. Electronically Signed   By: Randa Ngo M.D.   On: 08/17/2020 16:46    Procedures Procedures (including critical care time)  Medications Ordered in ED Medications  LORazepam (ATIVAN) injection 1 mg (1 mg Intravenous Given 08/17/20 1159)  lactated ringers bolus 1,000 mL (0 mLs Intravenous Stopped 08/17/20 1512)  acetaminophen (TYLENOL) tablet 1,000 mg (1,000 mg Oral Given 08/17/20 1541)    ED Course  I have reviewed the triage vital signs and the nursing notes.  Pertinent labs & imaging results that were available during my care of the patient were reviewed by me and considered in my medical decision making (see chart for details).    MDM  Rules/Calculators/A&P                          Patient was anxious, tachycardic, and tremulous on exam but remainder of VS normal. No abd tenderness and denying any abd or chest pain.  EKG shows sinus rhythm, tachycardic with some diffuse ST depressions.  Lab work shows normal CBC, initial troponin at 146 with repeat 78. K 3.1, CO2 18, BUN 37, Cr 1.31, AG 16 likely 2/2 dehydration. Gave ativan and IVF bolus as I suspect degree of benzo withdrawal in addition to dehydration.   Patient had troponin initially sent from triage which was notable at 146, repeat 78 then 75. She does have some ST depressions on EKG but has denied any other symptoms aside from vomiting. I have held off on starting heparin as my suspicion for ACS is low.   LFTs notable for AST 116, ALT 80, total bilirubin 1.5, lipase 197. Concern for possible gallstone pancreatitis or cholecystitis. Right upper quadrant ultrasound shows gallstones without pericholecystic fluid or dilated common bile duct. Patient has remained tachycardic here. I am signing the patient out pending admission for further treatment. Final Clinical Impression(s) / ED Diagnoses Final diagnoses:  None    Rx / DC Orders ED Discharge Orders    None       Mylisa Brunson, Wenda Overland, MD 08/17/20 1723

## 2020-08-17 NOTE — H&P (Signed)
Michaela Rios TML:[id number] DOB: [birth year] DOA: 08/17/2020     PCP: Binnie Rail, MD   Outpatient Specialists:  NONE    Patient arrived to ER on 08/17/20 at [place] Referred by Attending Lajean Saver, MD   Patient coming from: home Lives  With family    Chief Complaint:  tremors      HPI: Michaela Rios is a 69 y.o. female with medical history significant of hypertension, PVD, migraines, anxiety, GERD  3 days of nausea vomiting no pain no chest pain she has been a bit short of breath but no cough no diarrhea no urinary complaints  Presented with nausea vomiting since yesterday have been having intermittent panic attacks and her husband gave her some muscle relaxant She has been having some tremors and has been brought to the emergency department she takes Ativan twice a day but has not been able to take her medicines for past 3 days. She does not drink. Have been vaccinated no sick contacts. No fever she have had some cough Never smoked, does not drink etOH Have been on benzo for 16m Reports generalized fatigue  Patient has been having dysuria as well as hematuria she reports she feels like she has a urinary tract infection Infectious risk factors:  Reports N/V/     Has  been vaccinated against COVID    Initial COVID TEST  NEGATIVE   Lab Results  Component Value Date   [place] NEGATIVE 08/17/2020    Regarding pertinent Chronic problems:       HTN on Norvasc hydralazine, Toprol     Pre DM 2 -  Lab Results  Component Value Date   HGBA1C 5.5 04/06/2020    diet controlled     CKD stage III - baseline Cr   Estimated Creatinine Clearance: 36.6 mL/min (A) (by C-G formula based on SCr of 1.31 mg/dL (H)).  Lab Results  Component Value Date   CREATININE 1.31 (H) 08/17/2020   CREATININE 1.43 (H) 04/06/2020   CREATININE 1.20 01/06/2020    While in ER: Troponin noted to be elevated to- 146 - 75 - 78  K 3.1 Lipase 197    Hospitalist was called for  admission for UTI, dehydration and tachycardia  The following Work up has been ordered so far:  Orders Placed This Encounter  Procedures  . SARS Coronavirus 2 by RT PCR (hospital order, performed in [facility] hospital lab) Nasopharyngeal Nasopharyngeal Swab  . CT Head Wo Contrast  . US Abdomen Limited  . DG Chest Port 1 View  . Basic metabolic panel  . CBC  . Lipase, blood  . Hepatic function panel  . Magnesium  . TSH  . D-dimer, quantitative (not at [facility])  . Document Height and Actual Weight  . Consult to hospitalist  ALL PATIENTS BEING ADMITTED/HAVING PROCEDURES NEED COVID-19 SCREENING  . ED EKG  . EKG 12-Lead  . EKG 12-Lead  . EKG 12-Lead   Following Medications were ordered in ER: Medications  LORazepam (ATIVAN) injection 1 mg (1 mg Intravenous Given 08/17/20 1159)  lactated ringers bolus 1,000 mL (0 mLs Intravenous Stopped 08/17/20 1512)  acetaminophen (TYLENOL) tablet 1,000 mg (1,000 mg Oral Given 08/17/20 1541)  sodium chloride 0.9 % bolus 1,000 mL (1,000 mLs Intravenous New Bag/Given 08/17/20 1838)  potassium chloride SA (KLOR-CON) CR tablet 40 mEq (40 mEq Oral Given 08/17/20 1838)  LORazepam (ATIVAN) injection 1 mg (1 mg Intravenous Given 08/17/20 1858)  Consult Orders  (From admission, onward)         Start     Ordered   08/17/20 1947  Consult to hospitalist  ALL PATIENTS BEING ADMITTED/HAVING PROCEDURES NEED COVID-19 SCREENING  Once       Comments: ALL PATIENTS BEING ADMITTED/HAVING PROCEDURES NEED COVID-19 SCREENING  Provider:  (Not yet assigned)  Question Answer Comment  Place call to: [facility]   Reason for Consult Admit      08/17/20 1946          Significant initial  Findings: Abnormal Labs Reviewed  BASIC METABOLIC PANEL - Abnormal; Notable for the following components:      Result Value   Potassium 3.1 (*)    CO2 18 (*)    Glucose, Bld 111 (*)    BUN 37 (*)    Creatinine, Ser 1.31 (*)    GFR calc non Af Amer 41 (*)    GFR  calc Af Amer 48 (*)    Anion gap 16 (*)    All other components within normal limits  CBC - Abnormal; Notable for the following components:   RDW 16.0 (*)    All other components within normal limits  LIPASE, BLOOD - Abnormal; Notable for the following components:   Lipase 197 (*)    All other components within normal limits  HEPATIC FUNCTION PANEL - Abnormal; Notable for the following components:   Total Protein 9.0 (*)    AST 116 (*)    ALT 80 (*)    Total Bilirubin 1.5 (*)    Bilirubin, Direct 0.3 (*)    Indirect Bilirubin 1.2 (*)    All other components within normal limits  CK - Abnormal; Notable for the following components:   Total CK 1,086 (*)    All other components within normal limits  COMPREHENSIVE METABOLIC PANEL - Abnormal; Notable for the following components:   CO2 19 (*)    BUN 31 (*)    AST 116 (*)    ALT 79 (*)    Total Bilirubin 1.5 (*)    All other components within normal limits  I-STAT VENOUS BLOOD GAS, ED - Abnormal; Notable for the following components:   pH, Ven 7.449 (*)    pCO2, Ven 31.7 (*)    pO2, Ven 148.0 (*)    Calcium, Ion 1.10 (*)    All other components within normal limits  TROPONIN I (HIGH SENSITIVITY) - Abnormal; Notable for the following components:   Troponin I (High Sensitivity) 146 (*)    All other components within normal limits  TROPONIN I (HIGH SENSITIVITY) - Abnormal; Notable for the following components:   Troponin I (High Sensitivity) 78 (*)    All other components within normal limits  TROPONIN I (HIGH SENSITIVITY) - Abnormal; Notable for the following components:   Troponin I (High Sensitivity) 75 (*)    All other components within normal limits  TROPONIN I (HIGH SENSITIVITY) - Abnormal; Notable for the following components:   Troponin I (High Sensitivity) 78 (*)    All other components within normal limits  TROPONIN I (HIGH SENSITIVITY) - Abnormal; Notable for the following components:   Troponin I (High Sensitivity) 79  (*)    All other components within normal limits  [place] Otherwise labs showing:    Recent Labs  Lab 08/17/20 0203 08/17/20 1149 08/17/20 2118 08/17/20 2241 08/18/20 0021  NA 138  --   --  139 141  K 3.1*  --   --  3.6 3.7  CO2 18*  --   --  19*  --   GLUCOSE 111*  --   --  93  --   BUN 37*  --   --  31*  --   CREATININE 1.31*  --   --  0.93  --   CALCIUM 10.1  --   --  9.4  --   MG  --  2.4 2.2  --   --   PHOS  --   --  3.4  --   --     Cr Up from baseline see below Lab Results  Component Value Date   CREATININE 0.93 08/17/2020   CREATININE 1.31 (H) 08/17/2020   CREATININE 1.43 (H) 04/06/2020    Recent Labs  Lab 08/17/20 1149 08/17/20 2241  AST 116* 116*  ALT 80* 79*  ALKPHOS 68 58  BILITOT 1.5* 1.5*  PROT 9.0* 7.1  ALBUMIN 4.6 3.9   Lab Results  Component Value Date   CALCIUM 9.4 08/17/2020   PHOS 3.4 08/17/2020     WBC    Component Value Date/Time   WBC 6.0 08/17/2020 0203   LYMPHSABS 1.3 07/04/2019 1146   MONOABS 0.5 07/04/2019 1146   EOSABS 0.2 07/04/2019 1146   BASOSABS 0.0 07/04/2019 1146  Plt: Lab Results  Component Value Date   PLT 294 08/17/2020   Lactic Acid, Venous    Component Value Date/Time   LATICACIDVEN 1.2 08/17/2020 2110       COVID-19 Labs  Recent Labs    08/17/20 1830  DDIMER 0.50    Lab Results  Component Value Date   SARSCOV2NAA NEGATIVE 08/17/2020      Venous  Blood Gas result:  pH 7.449  pCO2 31.7   HG/HCT  stable,       Component Value Date/Time   HGB 13.5 08/17/2020 0203   HCT 41.2 08/17/2020 0203   MCV 91.8 08/17/2020 0203    Recent Labs  Lab 08/17/20 1149  LIPASE 197*     Troponin 146 - 71-78  Cardiac Panel (last 3 results) Recent Labs    08/17/20 2118  CKTOTAL 1,086*     ECG: Ordered Personally reviewed by me showing: HR : 136 Rhythm:Sinus tachycardia nonspecific changes,  QTC 480     DM  labs:  HbA1C: Recent Labs    01/06/20 1516 04/06/20 1423  HGBA1C 6.0 5.5     CBG  (last 3)  Recent Labs    08/17/20 2109 08/18/20 0006  GLUCAP 87 90      UA obtain UA   Ordered  CT HEAD  NON acute  CXR -  NON acute  [place] abd - Small nonshadowing gallstones versus tumefactive sludge. No evidence of cholecystitis.   ED Triage Vitals  Enc Vitals Group     BP 08/17/20 0125 (!) 111/91     Pulse Rate 08/17/20 0125 (!) 120     Resp 08/17/20 0125 20     Temp 08/17/20 0125 98.1 F (36.7 C)     Temp Source 08/17/20 0125 Oral     SpO2 08/17/20 0125 91 %     Weight 08/17/20 0131 141 lb 15.6 oz (64.4 kg)     Height 08/17/20 0131 5\' 3"  (1.6 m)     Head Circumference --      Peak Flow --      Pain Score 08/17/20 0131 0     Pain Loc --      Pain Edu? --  Excl. in [place]? --   TMAX(24)@      Latest  Blood pressure (!) 170/96, pulse (!) 112, temperature 98.7 F (37.1 C), temperature source Oral, resp. rate 18, height 5\' 3"  (1.6 m), weight 64.4 kg, SpO2 100 %.    Review of Systems:    Pertinent positives include:  chills, fatigue abdominal pain, nausea,dysuria, change in color of urine  Constitutional:  No weight loss, night sweats, Fevers, , weight loss  HEENT:  No headaches, Difficulty swallowing,Tooth/dental problems,Sore throat,  No sneezing, itching, ear ache, nasal congestion, post nasal drip,  Cardio-vascular:  No chest pain, Orthopnea, PND, anasarca, dizziness, palpitations.no Bilateral lower extremity swelling  GI:  No heartburn, indigestion,  vomiting, diarrhea, change in bowel habits, loss of appetite, melena, blood in stool, hematemesis Resp:  no shortness of breath at rest. No dyspnea on exertion, No excess mucus, no productive cough, No non-productive cough, No coughing up of blood.No change in color of mucus.No wheezing. Skin:  no rash or lesions. No jaundice GU:  no , no urgency or frequency. No straining to urinate.  No flank pain.  Musculoskeletal:  No joint pain or no joint swelling. No decreased range of motion. No back pain.  Psych:  No  change in mood or affect. No depression or anxiety. No memory loss.  Neuro: no localizing neurological complaints, no tingling, no weakness, no double vision, no gait abnormality, no slurred speech, no confusion  All systems reviewed and apart from [place] all are negative  Past Medical History:   Past Medical History:  Diagnosis Date  . Anemia   . Anxiety   . Arthritis   . Cataract   . Chronic migraine    follows with neuro for same  . Clotting disorder (HCC)    clot in ovarian vein- hx  . Esophagitis   . GERD (gastroesophageal reflux disease)   . Hypertension   . IBS (irritable bowel syndrome)   . Low back pain syndrome   . MRSA (methicillin resistant Staphylococcus aureus) infection 04/17/12   left torso  . Peptic ulcer disease 2003, 12/2012  . Peripheral vascular disease ([place])   . Vitamin D deficiency     Past Surgical History:  Procedure Laterality Date  . ABDOMINAL HYSTERECTOMY    . LAPAROSCOPIC BILATERAL SALPINGO OOPHERECTOMY    . pud surgery w/duod sticture-plasty and vagotomy  2003   Dr. Marlou Starks  . TONSILLECTOMY AND ADENOIDECTOMY     as a child  . TUBAL LIGATION  1984    Social History:  Ambulatory independently       reports that she has never smoked. She has never used smokeless tobacco. She reports current alcohol use. She reports that she does not use drugs.  Family History:  Family History  Problem Relation Age of Onset  . Aneurysm Father   . Prostate cancer Brother   . Lung cancer Brother   . Colon cancer Neg Hx   . Esophageal cancer Neg Hx   . Rectal cancer Neg Hx   . Stomach cancer Neg Hx     Allergies: Allergies  Allergen Reactions  . Amoxicillin     REACTION: hives  . Cephalexin     REACTION: hives  . Lisinopril     REACTION: ? hx of ACE cough...     Prior to Admission medications   Medication Sig Start Date End Date Taking? Authorizing Provider  amLODipine (NORVASC) 5 MG tablet Take 1 tablet (5 mg total) by mouth daily. 05/15/20  Binnie Rail, MD  buPROPion (WELLBUTRIN XL) 300 MG 24 hr tablet Take 1 tablet (300 mg total) by mouth daily. 07/29/20   Binnie Rail, MD  calcium-vitamin D (OSCAL 500/200 D-3) 500-200 MG-UNIT per tablet Take 1 tablet by mouth twice daily     [provider]  ciclopirox (LOPROX) 0.77 % cream Apply topically 2 (two) times daily. 06/30/20   Binnie Rail, MD  cyclobenzaprine (FLEXERIL) 10 MG tablet Take 1 tablet (10 mg total) by mouth 3 (three) times daily as needed for muscle spasms. 07/02/20   Binnie Rail, MD  EPINEPHrine (EPIPEN 2-PAK) 0.3 mg/0.3 mL IJ SOAJ injection Inject 0.3 mLs (0.3 mg total) into the muscle as needed for anaphylaxis. 08/15/19   Binnie Rail, MD  furosemide (LASIX) 20 MG tablet Take 1 tablet (20 mg total) by mouth daily. 05/15/20   Binnie Rail, MD  gabapentin (NEURONTIN) 100 MG capsule Take 100-200 mg in morning and 400 mg at bedtime 07/29/20   Binnie Rail, MD  hydrALAZINE (APRESOLINE) 25 MG tablet Take 1 tablet (25 mg total) by mouth 3 (three) times daily. Patient taking differently: Take 25 mg by mouth in the morning and at bedtime.  05/15/20   Binnie Rail, MD  HYDROcodone-acetaminophen (NORCO) 7.5-325 MG tablet Take 1 tablet by mouth 3 (three) times daily as needed for severe pain (headaches). 08/14/20   Burns, Claudina Lick, MD  LORazepam (ATIVAN) 1 MG tablet Take 1 tablet (1 mg total) by mouth 2 (two) times daily. 08/14/20   Binnie Rail, MD  metoprolol succinate (TOPROL-XL) 100 MG 24 hr tablet Take 1 tablet (100 mg total) by mouth daily. Take with or immediately following a meal. 05/15/20   Burns, Claudina Lick, MD  omeprazole (PRILOSEC) 20 MG capsule Take 1 capsule (20 mg total) by mouth daily. 05/15/20   Binnie Rail, MD  SUMAtriptan (IMITREX) 100 MG tablet Take 1 tablet by mouth  every 2 hours as needed for migraine. Max 2 tablets in  24 hours. 04/28/15 04/27/16  [provider]  SUMAtriptan (IMITREX) 20 MG/ACT nasal spray Place 1 spray into the nose. 12/17/14  12/17/15  [provider]  tiZANidine (ZANAFLEX) 4 MG tablet  11/04/17   [provider]  topiramate (TOPAMAX) 50 MG tablet Take 1.5 tablets by mouth daily.    [provider]  valACYclovir (VALTREX) 1000 MG tablet Take 1 tablet (1,000 mg total) by mouth 3 (three) times daily. 06/30/20   Binnie Rail, MD  venlafaxine XR (EFFEXOR-XR) 75 MG 24 hr capsule Take 225 mg by mouth. 12/17/14 12/17/15  [provider]  zolpidem (AMBIEN) 10 MG tablet TAKE 1 TABLET BY MOUTH AT BEDTIME AS NEEDED FOR SLEEP 07/20/20   Binnie Rail, MD   Physical Exam: Vitals with BMI 08/17/2020 08/17/2020 08/17/2020  Height - - -  Weight - - -  BMI - - -  Systolic 440 347 425  Diastolic 96 97 96  Pulse 956 - 112     1. General:  in No  Acute distress    Chronically ill  -appearing 2. Psychological: Alert and  Oriented 3. Head/ENT:   Dry Mucous Membranes                          Head Non traumatic, neck supple  Poor Dentition 4. SKIN:    decreased Skin turgor,  Skin clean Dry and intact no rash 5. Heart: rapid Regular rate and rhythm no  Murmur, no Rub or gallop 6. Lungs:  Clear to auscultation bilaterally, no wheezes or crackles   7. Abdomen: Soft, suprapubic tender, Non distended bowel sounds present 8. Lower extremities: no clubbing, cyanosis, no  edema 9. Neurologically Grossly intact, moving all 4 extremities equally   10. MSK: Normal range of motion   All other LABS:     Recent Labs  Lab 08/17/20 0203  WBC 6.0  HGB 13.5  HCT 41.2  MCV 91.8  PLT 294     Recent Labs  Lab 08/17/20 0203 08/17/20 1149 08/17/20 2118 08/17/20 2241 08/18/20 0021  NA 138  --   --  139 141  K 3.1*  --   --  3.6 3.7  CL 104  --   --  106  --   CO2 18*  --   --  19*  --   GLUCOSE 111*  --   --  93  --   BUN 37*  --   --  31*  --   CREATININE 1.31*  --   --  0.93  --   CALCIUM 10.1  --   --  9.4  --   MG  --  2.4 2.2  --   --   PHOS  --   --  3.4  --   --        Recent Labs  Lab 08/17/20 1149  AST 116*  ALT 80*  ALKPHOS 68  BILITOT 1.5*  PROT 9.0*  ALBUMIN 4.6       Cultures: No results found for: SDES, SPECREQUEST, CULT, REPTSTATUS   Radiological Exams on Admission: CT Head Wo Contrast  Result Date: 08/17/2020 CLINICAL DATA:  Syncope, headache EXAM: CT HEAD WITHOUT CONTRAST TECHNIQUE: Contiguous axial images were obtained from the base of the skull through the vertex without intravenous contrast. COMPARISON:  None. FINDINGS: Brain: No evidence of acute infarction, hemorrhage, hydrocephalus, extra-axial collection or mass lesion/mass effect. Vascular: Atherosclerotic calcifications involving the large vessels of the skull base. No unexpected hyperdense vessel. Skull: Normal. Negative for fracture or focal lesion. Sinuses/Orbits: No acute finding. Other: None. IMPRESSION: No acute intracranial findings. Electronically Signed   By: Davina Poke D.O.   On: 08/17/2020 13:04   US Abdomen Limited  Result Date: 08/17/2020 CLINICAL DATA:  Right upper quadrant pain, nausea and vomiting EXAM: ULTRASOUND ABDOMEN LIMITED RIGHT UPPER QUADRANT COMPARISON:  02/03/2011 FINDINGS: Gallbladder: Small nonshadowing gallstones versus tumefactive sludge noted within the gallbladder lumen. No evidence of shadowing gallstones or cholecystitis. No gallbladder wall thickening or pericholecystic fluid. Common bile duct: Diameter: 6 mm Liver: No focal lesion identified. Within normal limits in parenchymal echogenicity. Portal vein is patent on color Doppler imaging with normal direction of blood flow towards the liver. Other: None. IMPRESSION: 1. Small nonshadowing gallstones versus tumefactive sludge. No evidence of cholecystitis. Electronically Signed   By: Randa Ngo M.D.   On: 08/17/2020 16:46   DG Chest Port 1 View  Result Date: 08/17/2020 CLINICAL DATA:  Weakness, tachycardia, nausea and vomiting EXAM: PORTABLE CHEST 1 VIEW COMPARISON:  09/26/2011  FINDINGS: Single frontal view of the chest demonstrates a stable cardiac silhouette. There is stable ectasia of the thoracic aorta. No airspace disease, effusion, or pneumothorax. No acute bony abnormality. IMPRESSION: 1. No acute intrathoracic process. Electronically Signed   By: Randa Ngo  M.D.   On: 08/17/2020 18:56    Chart has been reviewed    Assessment/Plan  69 y.o. female with medical history significant of hypertension, PVD, migraines, anxiety, GERD    Admitted for dehydration, elevated LFt's, hypokalemia  Present on Admission: Tachycardia sinus tachycardia in the setting of dehydration rhabdomyolysis nausea vomiting and not been taking her metoprolol Will resume will rehydrate and continue to follow observe in stepdown given significant tachycardia.  D-dimer unremarkable . Rhabdomyolysis - will rehydrate and repeat CK in AM  Possible UTI will obtain UA urine culture treat if needed If it does not improve may need additional imaging of the kidneys  . CKD (chronic kidney disease) stage 3, GFR 30-59 ml/min -avoid nephrotoxic medications  . Essential hypertension -resume home medications if BP allows  . GERD -stable continue home medications  . Prediabetes -monitor blood sugars check hemoglobin A1c  . Elevated LFTs most likely in the setting of rhabdomyolysis with elevated CK we will continue to follow after fluid rehydration.  For completion of the hepatitis panel in a.m.  Right upper quadrant ultrasound showed no acute cholecystitis  showed some sludge Patient can follow-up as an outpatient regarding this No right upper quadrant pain to suggest inflammatory process  . Anxiety -patient usually takes Ativan on a regular basis.  She has not been able to tolerate it for the past 3 days likely resulting in benzodiazepine withdrawal.  Will order CIWA protocol to help her symptoms.  Resume Ativan as needed  . Hypokalemia -was replaced check in am  magnesium level WNL  .  Elevated troponin -Patient currently denies any chest pain  Probably demand ischemia in the setting of dehydration possible infection, elevated CK  - EKG without evidence of acute ischemia  - Admitted to telemetry   - continue to cycle cardiac enzymes   - obtain echogram in AM  - cardiology consult  If going up   Other plan as per orders.  DVT prophylaxis:   Lovenox       Code Status:    Code Status: Not on file FULL CODE  as per patient   I had personally discussed CODE STATUS with patient and family    Family Communication:   Family at  Bedside  plan of care was discussed  with   Husband,   Disposition Plan:   To home once workup is complete and patient is stable   Following barriers for discharge:                            Electrolytes corrected                               transition to PO antibiotics                             Will need to be able to tolerate PO                                           Consults called: none    Admission status:  ED Disposition    ED Disposition Condition [place]: [place] [100100]  Level of Care: Progressive [102]  Admit to Progressive based  on following criteria: CARDIOVASCULAR & THORACIC of moderate stability with acute coronary syndrome symptoms/low risk myocardial infarction/hypertensive urgency/arrhythmias/heart failure potentially compromising stability and stable post cardiovascular intervention patients.  Covid Evaluation: Confirmed COVID Negative  Diagnosis: Elevated troponin [073710]  Admitting Physician: Toy Baker [[id number]  Attending Physician: Toy Baker [[id number]]      Obs    Level of care   tele  For   24H     Lab Results  Component Value Date   [place] 08/17/2020     Precautions: admitted as  Covid Negative      PPE: Used by the provider:   P100  eye Goggles,  Gloves    Keilynn Marano 08/17/2020, 9:23 PM    [facility]      after 2 AM please page floor coverage PA If 7AM-7PM, please contact the day team taking care of the patient using [email or web address]   Patient was evaluated in the context of the global COVID-19 pandemic, which necessitated consideration that the patient might be at risk for infection with the SARS-CoV-2 virus that causes COVID-19. Institutional protocols and algorithms that pertain to the evaluation of patients at risk for COVID-19 are in a state of rapid change based on information released by regulatory bodies including the CDC and federal and state organizations. These policies and algorithms were followed during the patient's care.

## 2020-08-17 NOTE — ED Notes (Signed)
Pt requesting ativan this rn notified MD

## 2020-08-18 ENCOUNTER — Encounter (HOSPITAL_COMMUNITY): Payer: Self-pay | Admitting: Internal Medicine

## 2020-08-18 ENCOUNTER — Observation Stay (HOSPITAL_COMMUNITY): Payer: Medicare HMO

## 2020-08-18 DIAGNOSIS — R319 Hematuria, unspecified: Secondary | ICD-10-CM | POA: Diagnosis present

## 2020-08-18 DIAGNOSIS — K219 Gastro-esophageal reflux disease without esophagitis: Secondary | ICD-10-CM | POA: Diagnosis not present

## 2020-08-18 DIAGNOSIS — R112 Nausea with vomiting, unspecified: Secondary | ICD-10-CM | POA: Diagnosis not present

## 2020-08-18 DIAGNOSIS — E860 Dehydration: Secondary | ICD-10-CM | POA: Diagnosis present

## 2020-08-18 DIAGNOSIS — B029 Zoster without complications: Secondary | ICD-10-CM | POA: Diagnosis present

## 2020-08-18 DIAGNOSIS — M6282 Rhabdomyolysis: Secondary | ICD-10-CM | POA: Diagnosis present

## 2020-08-18 DIAGNOSIS — D689 Coagulation defect, unspecified: Secondary | ICD-10-CM | POA: Diagnosis present

## 2020-08-18 DIAGNOSIS — I739 Peripheral vascular disease, unspecified: Secondary | ICD-10-CM | POA: Diagnosis present

## 2020-08-18 DIAGNOSIS — E876 Hypokalemia: Secondary | ICD-10-CM | POA: Diagnosis not present

## 2020-08-18 DIAGNOSIS — R55 Syncope and collapse: Secondary | ICD-10-CM | POA: Diagnosis not present

## 2020-08-18 DIAGNOSIS — Z90710 Acquired absence of both cervix and uterus: Secondary | ICD-10-CM | POA: Diagnosis not present

## 2020-08-18 DIAGNOSIS — R778 Other specified abnormalities of plasma proteins: Secondary | ICD-10-CM | POA: Diagnosis not present

## 2020-08-18 DIAGNOSIS — Z8614 Personal history of Methicillin resistant Staphylococcus aureus infection: Secondary | ICD-10-CM | POA: Diagnosis not present

## 2020-08-18 DIAGNOSIS — Z9079 Acquired absence of other genital organ(s): Secondary | ICD-10-CM | POA: Diagnosis not present

## 2020-08-18 DIAGNOSIS — F13230 Sedative, hypnotic or anxiolytic dependence with withdrawal, uncomplicated: Secondary | ICD-10-CM | POA: Diagnosis not present

## 2020-08-18 DIAGNOSIS — R7989 Other specified abnormal findings of blood chemistry: Secondary | ICD-10-CM | POA: Diagnosis not present

## 2020-08-18 DIAGNOSIS — R748 Abnormal levels of other serum enzymes: Secondary | ICD-10-CM | POA: Diagnosis not present

## 2020-08-18 DIAGNOSIS — R7401 Elevation of levels of liver transaminase levels: Secondary | ICD-10-CM | POA: Diagnosis present

## 2020-08-18 DIAGNOSIS — N183 Chronic kidney disease, stage 3 unspecified: Secondary | ICD-10-CM | POA: Diagnosis present

## 2020-08-18 DIAGNOSIS — F419 Anxiety disorder, unspecified: Secondary | ICD-10-CM | POA: Diagnosis not present

## 2020-08-18 DIAGNOSIS — N390 Urinary tract infection, site not specified: Secondary | ICD-10-CM | POA: Diagnosis present

## 2020-08-18 DIAGNOSIS — Z8711 Personal history of peptic ulcer disease: Secondary | ICD-10-CM | POA: Diagnosis not present

## 2020-08-18 DIAGNOSIS — K589 Irritable bowel syndrome without diarrhea: Secondary | ICD-10-CM | POA: Diagnosis present

## 2020-08-18 DIAGNOSIS — J309 Allergic rhinitis, unspecified: Secondary | ICD-10-CM | POA: Diagnosis present

## 2020-08-18 DIAGNOSIS — B354 Tinea corporis: Secondary | ICD-10-CM | POA: Diagnosis present

## 2020-08-18 DIAGNOSIS — R69 Illness, unspecified: Secondary | ICD-10-CM | POA: Diagnosis not present

## 2020-08-18 DIAGNOSIS — F410 Panic disorder [episodic paroxysmal anxiety] without agoraphobia: Secondary | ICD-10-CM | POA: Diagnosis present

## 2020-08-18 DIAGNOSIS — I10 Essential (primary) hypertension: Secondary | ICD-10-CM | POA: Diagnosis not present

## 2020-08-18 DIAGNOSIS — I129 Hypertensive chronic kidney disease with stage 1 through stage 4 chronic kidney disease, or unspecified chronic kidney disease: Secondary | ICD-10-CM | POA: Diagnosis present

## 2020-08-18 DIAGNOSIS — R7303 Prediabetes: Secondary | ICD-10-CM | POA: Diagnosis not present

## 2020-08-18 DIAGNOSIS — Z20822 Contact with and (suspected) exposure to covid-19: Secondary | ICD-10-CM | POA: Diagnosis present

## 2020-08-18 LAB — I-STAT VENOUS BLOOD GAS, ED
Acid-base deficit: 1 mmol/L (ref 0.0–2.0)
Bicarbonate: 22 mmol/L (ref 20.0–28.0)
Calcium, Ion: 1.1 mmol/L — ABNORMAL LOW (ref 1.15–1.40)
HCT: 40 % (ref 36.0–46.0)
Hemoglobin: 13.6 g/dL (ref 12.0–15.0)
O2 Saturation: 99 %
Potassium: 3.7 mmol/L (ref 3.5–5.1)
Sodium: 141 mmol/L (ref 135–145)
TCO2: 23 mmol/L (ref 22–32)
pCO2, Ven: 31.7 mmHg — ABNORMAL LOW (ref 44.0–60.0)
pH, Ven: 7.449 — ABNORMAL HIGH (ref 7.250–7.430)
pO2, Ven: 148 mmHg — ABNORMAL HIGH (ref 32.0–45.0)

## 2020-08-18 LAB — URINALYSIS, ROUTINE W REFLEX MICROSCOPIC
Bilirubin Urine: NEGATIVE
Glucose, UA: NEGATIVE mg/dL
Ketones, ur: 20 mg/dL — AB
Nitrite: POSITIVE — AB
Protein, ur: 100 mg/dL — AB
RBC / HPF: >50 RBC/hpf — ABNORMAL HIGH (ref 0–5)
Specific Gravity, Urine: 1.024 (ref 1.005–1.030)
WBC, UA: >50 WBC/hpf — ABNORMAL HIGH (ref 0–5)
pH: 5 (ref 5.0–8.0)

## 2020-08-18 LAB — CBC WITH DIFFERENTIAL/PLATELET
Abs Immature Granulocytes: 0.01 10*3/uL (ref 0.00–0.07)
Basophils Absolute: 0 10*3/uL (ref 0.0–0.1)
Basophils Relative: 1 %
Eosinophils Absolute: 0.1 10*3/uL (ref 0.0–0.5)
Eosinophils Relative: 2 %
HCT: 35.4 % — ABNORMAL LOW (ref 36.0–46.0)
Hemoglobin: 11.7 g/dL — ABNORMAL LOW (ref 12.0–15.0)
Immature Granulocytes: 0 %
Lymphocytes Relative: 25 %
Lymphs Abs: 1 10*3/uL (ref 0.7–4.0)
MCH: 31 pg (ref 26.0–34.0)
MCHC: 33.1 g/dL (ref 30.0–36.0)
MCV: 93.9 fL (ref 80.0–100.0)
Monocytes Absolute: 0.7 10*3/uL (ref 0.1–1.0)
Monocytes Relative: 17 %
Neutro Abs: 2.3 10*3/uL (ref 1.7–7.7)
Neutrophils Relative %: 55 %
Platelets: 208 10*3/uL (ref 150–400)
RBC: 3.77 MIL/uL — ABNORMAL LOW (ref 3.87–5.11)
RDW: 16 % — ABNORMAL HIGH (ref 11.5–15.5)
WBC: 4.1 10*3/uL (ref 4.0–10.5)
nRBC: 0 % (ref 0.0–0.2)

## 2020-08-18 LAB — COMPREHENSIVE METABOLIC PANEL
ALT: 79 U/L — ABNORMAL HIGH (ref 0–44)
ALT: 72 U/L — ABNORMAL HIGH (ref 0–44)
AST: 116 U/L — ABNORMAL HIGH (ref 15–41)
AST: 98 U/L — ABNORMAL HIGH (ref 15–41)
Albumin: 3.9 g/dL (ref 3.5–5.0)
Albumin: 3.5 g/dL (ref 3.5–5.0)
Alkaline Phosphatase: 58 U/L (ref 38–126)
Alkaline Phosphatase: 53 U/L (ref 38–126)
Anion gap: 14 (ref 5–15)
Anion gap: 10 (ref 5–15)
BUN: 31 mg/dL — ABNORMAL HIGH (ref 8–23)
BUN: 27 mg/dL — ABNORMAL HIGH (ref 8–23)
CO2: 19 mmol/L — ABNORMAL LOW (ref 22–32)
CO2: 20 mmol/L — ABNORMAL LOW (ref 22–32)
Calcium: 9.4 mg/dL (ref 8.9–10.3)
Calcium: 9.1 mg/dL (ref 8.9–10.3)
Chloride: 106 mmol/L (ref 98–111)
Chloride: 110 mmol/L (ref 98–111)
Creatinine, Ser: 0.93 mg/dL (ref 0.44–1.00)
Creatinine, Ser: 0.89 mg/dL (ref 0.44–1.00)
GFR calc Af Amer: >60 mL/min (ref >60)
GFR calc Af Amer: >60 mL/min (ref >60)
GFR calc non Af Amer: >60 mL/min (ref >60)
GFR calc non Af Amer: >60 mL/min (ref >60)
Glucose, Bld: 93 mg/dL (ref 70–99)
Glucose, Bld: 88 mg/dL (ref 70–99)
Potassium: 3.6 mmol/L (ref 3.5–5.1)
Potassium: 3.2 mmol/L — ABNORMAL LOW (ref 3.5–5.1)
Sodium: 139 mmol/L (ref 135–145)
Sodium: 140 mmol/L (ref 135–145)
Total Bilirubin: 1.5 mg/dL — ABNORMAL HIGH (ref 0.3–1.2)
Total Bilirubin: 1.4 mg/dL — ABNORMAL HIGH (ref 0.3–1.2)
Total Protein: 7.1 g/dL (ref 6.5–8.1)
Total Protein: 7.2 g/dL (ref 6.5–8.1)

## 2020-08-18 LAB — HIV ANTIBODY (ROUTINE TESTING W REFLEX): HIV Screen 4th Generation wRfx: NONREACTIVE

## 2020-08-18 LAB — HEPATITIS PANEL, ACUTE
HCV Ab: NONREACTIVE
Hep A IgM: NONREACTIVE
Hep B C IgM: NONREACTIVE
Hepatitis B Surface Ag: NONREACTIVE

## 2020-08-18 LAB — PHOSPHORUS: Phosphorus: 3.1 mg/dL (ref 2.5–4.6)

## 2020-08-18 LAB — ECHOCARDIOGRAM COMPLETE
Height: 63 in
S' Lateral: 2.1 cm
Weight: 2147.2 oz

## 2020-08-18 LAB — GLUCOSE, CAPILLARY
Glucose-Capillary: 88 mg/dL (ref 70–99)
Glucose-Capillary: 101 mg/dL — ABNORMAL HIGH (ref 70–99)
Glucose-Capillary: 95 mg/dL (ref 70–99)
Glucose-Capillary: 110 mg/dL — ABNORMAL HIGH (ref 70–99)
Glucose-Capillary: 100 mg/dL — ABNORMAL HIGH (ref 70–99)

## 2020-08-18 LAB — CK: Total CK: 820 U/L — ABNORMAL HIGH (ref 38–234)

## 2020-08-18 LAB — TSH: TSH: 0.884 u[IU]/mL (ref 0.350–4.500)

## 2020-08-18 LAB — TROPONIN I (HIGH SENSITIVITY): Troponin I (High Sensitivity): 79 ng/L — ABNORMAL HIGH (ref <18)

## 2020-08-18 LAB — CBG MONITORING, ED: Glucose-Capillary: 90 mg/dL (ref 70–99)

## 2020-08-18 LAB — MAGNESIUM: Magnesium: 2 mg/dL (ref 1.7–2.4)

## 2020-08-18 MED ORDER — ONDANSETRON HCL 4 MG/2ML IJ SOLN: 4.0000 mg | Freq: Four times a day (QID) | INTRAMUSCULAR | Status: DC | PRN

## 2020-08-18 MED ORDER — LORAZEPAM 1 MG PO TABS: 1.0000 mg | ORAL_TABLET | Freq: Two times a day (BID) | ORAL | Status: DC

## 2020-08-18 MED ORDER — POTASSIUM CHLORIDE 10 MEQ/100ML IV SOLN: 10.0000 meq | INTRAVENOUS | Status: AC

## 2020-08-18 MED ORDER — SULFAMETHOXAZOLE-TRIMETHOPRIM 800-160 MG PO TABS
1.0000 | ORAL_TABLET | Freq: Two times a day (BID) | ORAL | Status: DC
  Administered 2020-08-18 – 2020-08-19 (×3): 1 via ORAL
  Filled 2020-08-18 (×3): qty 1

## 2020-08-18 MED ORDER — ENOXAPARIN SODIUM 40 MG/0.4ML ~~LOC~~ SOLN
40.0000 mg | SUBCUTANEOUS | Status: DC
  Administered 2020-08-18 – 2020-08-19 (×2): 40 mg via SUBCUTANEOUS
  Filled 2020-08-18 (×2): qty 0.4

## 2020-08-18 MED ORDER — LORAZEPAM 1 MG PO TABS
1.0000 mg | ORAL_TABLET | Freq: Two times a day (BID) | ORAL | Status: DC
  Administered 2020-08-18 – 2020-08-19 (×2): 1 mg via ORAL
  Filled 2020-08-18 (×2): qty 1

## 2020-08-18 MED ORDER — PANTOPRAZOLE SODIUM 40 MG PO TBEC
40.0000 mg | DELAYED_RELEASE_TABLET | Freq: Every day | ORAL | Status: DC
  Administered 2020-08-18 – 2020-08-19 (×2): 40 mg via ORAL
  Filled 2020-08-18 (×2): qty 1

## 2020-08-18 MED ORDER — POTASSIUM CHLORIDE CRYS ER 20 MEQ PO TBCR
40.0000 meq | EXTENDED_RELEASE_TABLET | Freq: Once | ORAL | Status: AC
  Administered 2020-08-18: 40 meq via ORAL
  Filled 2020-08-18: qty 2

## 2020-08-18 MED ORDER — AMLODIPINE BESYLATE 5 MG PO TABS
5.0000 mg | ORAL_TABLET | Freq: Every day | ORAL | Status: DC
  Administered 2020-08-18 – 2020-08-19 (×2): 5 mg via ORAL
  Filled 2020-08-18 (×2): qty 1

## 2020-08-18 MED ORDER — LORAZEPAM 1 MG PO TABS
1.0000 mg | ORAL_TABLET | Freq: Once | ORAL | Status: AC
  Administered 2020-08-18: 1 mg via ORAL
  Filled 2020-08-18: qty 1

## 2020-08-18 MED ORDER — TOPIRAMATE 25 MG PO TABS
75.0000 mg | ORAL_TABLET | Freq: Every day | ORAL | Status: DC
  Administered 2020-08-18 – 2020-08-19 (×2): 75 mg via ORAL
  Filled 2020-08-18 (×2): qty 3

## 2020-08-18 MED ORDER — POTASSIUM CHLORIDE CRYS ER 20 MEQ PO TBCR
20.0000 meq | EXTENDED_RELEASE_TABLET | Freq: Once | ORAL | Status: AC
  Administered 2020-08-18: 20 meq via ORAL
  Filled 2020-08-18: qty 1

## 2020-08-18 MED ORDER — METOPROLOL SUCCINATE ER 100 MG PO TB24
100.0000 mg | ORAL_TABLET | Freq: Every day | ORAL | Status: DC
  Administered 2020-08-18 – 2020-08-19 (×2): 100 mg via ORAL
  Filled 2020-08-18 (×2): qty 1

## 2020-08-18 MED ORDER — TRAMADOL HCL 50 MG PO TABS
50.0000 mg | ORAL_TABLET | Freq: Once | ORAL | Status: AC | PRN
  Administered 2020-08-18: 50 mg via ORAL
  Filled 2020-08-18: qty 1

## 2020-08-18 MED ORDER — SODIUM CHLORIDE 0.9% FLUSH
3.0000 mL | Freq: Two times a day (BID) | INTRAVENOUS | Status: DC
  Administered 2020-08-18 (×2): 3 mL via INTRAVENOUS

## 2020-08-18 MED ORDER — LORAZEPAM 1 MG PO TABS
1.0000 mg | ORAL_TABLET | Freq: Every day | ORAL | Status: DC | PRN
  Administered 2020-08-19: 1 mg via ORAL
  Filled 2020-08-18: qty 1

## 2020-08-18 MED ORDER — GERHARDT'S BUTT CREAM
TOPICAL_CREAM | Freq: Every day | CUTANEOUS | Status: DC
  Filled 2020-08-18: qty 1

## 2020-08-18 MED ORDER — BUPROPION HCL ER (XL) 150 MG PO TB24
300.0000 mg | ORAL_TABLET | Freq: Every day | ORAL | Status: DC
  Administered 2020-08-18 – 2020-08-19 (×2): 300 mg via ORAL
  Filled 2020-08-18 (×2): qty 2

## 2020-08-18 MED ORDER — ONDANSETRON HCL 4 MG PO TABS: 4.0000 mg | ORAL_TABLET | Freq: Four times a day (QID) | ORAL | Status: DC | PRN

## 2020-08-18 MED ORDER — VENLAFAXINE HCL ER 75 MG PO CP24
225.0000 mg | ORAL_CAPSULE | Freq: Every day | ORAL | Status: DC
  Administered 2020-08-18 – 2020-08-19 (×2): 225 mg via ORAL
  Filled 2020-08-18 (×2): qty 3

## 2020-08-18 NOTE — Assessment & Plan Note (Addendum)
--  1086 > 830 --etiology unclear, TSH WNL, K+ not that low, not on statin, no injury or muscle pain other than from emesis --continue IVF and check CK in AM

## 2020-08-18 NOTE — Progress Notes (Signed)
  Echocardiogram 2D Echocardiogram has been performed.  Johny Chess 08/18/2020, 10:08 AM

## 2020-08-18 NOTE — Plan of Care (Signed)
  Problem: Education: Goal: Knowledge of General Education information will improve Description Including pain rating scale, medication(s)/side effects and non-pharmacologic comfort measures Outcome: Progressing   

## 2020-08-18 NOTE — Hospital Course (Signed)
14yow PMH anxiety on Ativan BID, HTN presented w/ 3 days n/v, unable to keep BZD down; intermitten panic attacks, tremors. Admitted for dehydration, elevated LFTs, hypokalemia, n/v.

## 2020-08-18 NOTE — Assessment & Plan Note (Addendum)
--  in context of n/v and elevated CK --troponins 146 > 78 > 75 --EKG ST w/ inferolateral ST changes, consider ischemia, no old --asymptomatic, no evidence of ACS --no further evaluation

## 2020-08-18 NOTE — Assessment & Plan Note (Addendum)
--  mixed pattern, elevated AST, ALT, Tbili but AP WNL on admission. No pain and u/s negative; no s/s to suggest gallbladder pathology or CBD. No history of hepatitis --AP WNL, AST and ALT trending down --Tbili minimally elevated --Lipase 197 --f/u hepatitis panel and check CMP in AM

## 2020-08-18 NOTE — Assessment & Plan Note (Signed)
-  replete °

## 2020-08-18 NOTE — Assessment & Plan Note (Addendum)
--  etiology unclear --LFTs elevated on admit but trending down --Lipase modest elevation, will repeat but no abd pain makes pancreatitis less likely. Food poisoning possible. --RUQ u/s w/ small gallstones vs sludge. No evidence of cholecystitis.

## 2020-08-18 NOTE — Assessment & Plan Note (Signed)
--  appears stable, has been on Xanax scheduled for years, recent change to Ativan for cost --missed several days of Ativan --suspect component of BZD withdrawal precipitating n/v.

## 2020-08-18 NOTE — ED Notes (Signed)
Notified MD if pt can have potassium iv change to PO, verbal orders receive for 23meq PO, if pt can tolerate

## 2020-08-18 NOTE — Progress Notes (Signed)
PROGRESS NOTE  Michaela Rios NLG:[id number] DOB: 25-Nov-1951 DOA: 08/17/2020 PCP: Binnie Rail, MD  Brief History   89yow PMH anxiety on Ativan BID, HTN presented w/ 3 days n/v, unable to keep BZD down; intermitten panic attacks, tremors. Admitted for dehydration, elevated LFTs, hypokalemia, n/v.   A & P   Nausea & vomiting --etiology unclear --LFTs elevated on admit but trending down, not clear if conneced. --Lipase modest elevation, will repeat but no abd pain makes pancreatitis less likely. Food poisoning possible but BZD w/d seems more likely. --RUQ u/s w/ small gallstones vs sludge. No evidence of cholecystitis.  Elevated LFTs --mixed pattern, elevated AST, ALT, Tbili but AP WNL on admission. No pain and u/s negative; no s/s to suggest gallbladder pathology or CBD. No history of hepatitis --AP WNL, AST and ALT trending down --Tbili minimally elevated --Lipase 197 --f/u hepatitis panel and check CMP in AM  Rhabdomyolysis --1086 > 830 --etiology unclear, TSH WNL, K+ not that low, not on statin, no injury or muscle pain other than from emesis --continue IVF and check CK in AM  UTI --symptomatic --send urine culture --treat 3 days, oral fine  Anxiety --appears stable, has been on Xanax scheduled for years, recent change to Ativan for cost --missed several days of Ativan --suspect component of BZD withdrawal precipitating n/v.  Elevated troponin --in context of n/v and elevated CK --troponins 146 > 78 > 75 --EKG ST w/ inferolateral ST changes, consider ischemia, no old. Echo LV hyperdynamic, no WMA; RV nl systolic fxn, no valvular abnormalities --asymptomatic, no evidence of ACS --no further evaluation  Recent shingles RUE/hand --right hand dominant, still has some pain and swelling and numbness of right hand and difficulty using it --exam benign --OT consult  Disposition Plan:  Discussion: clinically better, labs overall better; no unifying dx yet. IVF,  continue diet, check labs in AM.  Status is: Inpatient  Remains inpatient appropriate because:Inpatient level of care appropriate due to severity of illness   Dispo: The patient is from: Home              Anticipated d/c is to: Home              Anticipated d/c date is: 1 day              Patient currently is not medically stable to d/c.  DVT prophylaxis: enoxaparin (LOVENOX) injection 40 mg Start: 08/18/20 0600   Code Status: Full Code Family Communication: husband at bedside  Murray Hodgkins, MD  [facility] Direct contact: see [email or web address] (further directions at bottom of note if needed) 7PM-7AM contact night coverage as at bottom of note 08/18/2020, 4:29 PM  LOS: 0 days    Interval History/Subjective  CC: f/u n/v  Feels better today; no vomiting today and tolerating diet. No h/o liver problems or hepatitis. Has been on Xanax for years, recent change to Ativan by PCP for cost, missed several days of Ativan   Has had dysuria and hesitancy  Objective   Vitals:  Vitals:   08/18/20 1430 08/18/20 1617  BP: (!) 162/107 (!) 154/105  Pulse: 96 83  Resp: 18 18  Temp: 97.9 F (36.6 C) 98 F (36.7 C)  SpO2: 97% 93%    Exam:  Physical Exam Constitutional:      General: She is not in acute distress.    Appearance: Normal appearance.  Cardiovascular:     Rate and Rhythm: Normal rate and regular rhythm.     Heart  sounds: Normal heart sounds. No murmur heard.  No friction rub. No gallop.   Pulmonary:     Effort: Respiratory distress present.     Breath sounds: Normal breath sounds. No wheezing, rhonchi or rales.  Abdominal:     General: Abdomen is flat. There is no distension.     Palpations: Abdomen is soft. There is no mass.     Tenderness: There is no abdominal tenderness.  Neurological:     Mental Status: She is alert.  Psychiatric:        Mood and Affect: Mood normal.        Behavior: Behavior normal.    right hand appears unremarkable  I have  personally reviewed the following:   Today's Data  . CBG stable . K+ 3.2 . AST down to 98 . ALT down to 72 . AP WNL . Tbili 1.4 . CK down to 820 . CBC and TSH unremarkable . U/a positive  Scheduled Meds: . amLODipine  5 mg Oral Daily  . buPROPion  300 mg Oral Daily  . enoxaparin (LOVENOX) injection  40 mg Subcutaneous Q24H  . Gerhardt's butt cream   Topical Daily  . LORazepam  1 mg Oral BID  . metoprolol succinate  100 mg Oral Daily  . multivitamin with minerals  1 tablet Oral Daily  . pantoprazole  40 mg Oral Daily  . sodium chloride flush  3 mL Intravenous Q12H  . sulfamethoxazole-trimethoprim  1 tablet Oral Q12H  . topiramate  75 mg Oral Daily  . venlafaxine XR  225 mg Oral Daily   Continuous Infusions: . sodium chloride 125 mL/hr at 08/18/20 1025    Principal Problem:   Nausea & vomiting Active Problems:   Elevated LFTs   Anxiety   Hypokalemia   Elevated troponin   Essential hypertension   GERD   Prediabetes   Rhabdomyolysis   LOS: 0 days   How to contact the [facility] Attending or Consulting provider [place] or covering provider during after hours [place], for this patient?  1. Check the care team in [facility] and look for a) attending/consulting [facility] provider listed and b) the [facility] team listed 2. Log into [email or web address] and use [place]'s universal password to access. If you do not have the password, please contact the hospital operator. 3. Locate the [facility] provider you are looking for under [facility] and page to a number that you can be directly reached. 4. If you still have difficulty reaching the provider, please page the [facility] (Director on Call) for the Hospitalists listed on amion for assistance.

## 2020-08-19 ENCOUNTER — Telehealth: Payer: Self-pay | Admitting: Internal Medicine

## 2020-08-19 DIAGNOSIS — R29898 Other symptoms and signs involving the musculoskeletal system: Secondary | ICD-10-CM

## 2020-08-19 LAB — COMPREHENSIVE METABOLIC PANEL
ALT: 55 U/L — ABNORMAL HIGH (ref 0–44)
AST: 60 U/L — ABNORMAL HIGH (ref 15–41)
Albumin: 3.8 g/dL (ref 3.5–5.0)
Alkaline Phosphatase: 57 U/L (ref 38–126)
Anion gap: 11 (ref 5–15)
BUN: 8 mg/dL (ref 8–23)
CO2: 19 mmol/L — ABNORMAL LOW (ref 22–32)
Calcium: 9.2 mg/dL (ref 8.9–10.3)
Chloride: 107 mmol/L (ref 98–111)
Creatinine, Ser: 0.72 mg/dL (ref 0.44–1.00)
GFR calc Af Amer: >60 mL/min (ref >60)
GFR calc non Af Amer: >60 mL/min (ref >60)
Glucose, Bld: 98 mg/dL (ref 70–99)
Potassium: 3.6 mmol/L (ref 3.5–5.1)
Sodium: 137 mmol/L (ref 135–145)
Total Bilirubin: 1 mg/dL (ref 0.3–1.2)
Total Protein: 7.2 g/dL (ref 6.5–8.1)

## 2020-08-19 LAB — LIPASE, BLOOD: Lipase: 344 U/L — ABNORMAL HIGH (ref 11–51)

## 2020-08-19 LAB — CK: Total CK: 569 U/L — ABNORMAL HIGH (ref 38–234)

## 2020-08-19 MED ORDER — ACETAMINOPHEN 325 MG PO TABS
650.0000 mg | ORAL_TABLET | Freq: Four times a day (QID) | ORAL | Status: DC | PRN
  Administered 2020-08-19: 650 mg via ORAL
  Filled 2020-08-19: qty 2

## 2020-08-19 MED ORDER — SULFAMETHOXAZOLE-TRIMETHOPRIM 800-160 MG PO TABS: 1.0000 | ORAL_TABLET | Freq: Two times a day (BID) | ORAL | 0 refills | Status: AC

## 2020-08-19 MED ORDER — ONDANSETRON HCL 4 MG PO TABS: 4.0000 mg | ORAL_TABLET | Freq: Four times a day (QID) | ORAL | 0 refills | Status: AC | PRN

## 2020-08-19 MED ORDER — HYDRALAZINE HCL 25 MG PO TABS: 25.0000 mg | ORAL_TABLET | Freq: Two times a day (BID) | ORAL | Status: DC

## 2020-08-19 MED ORDER — HYDROCODONE-ACETAMINOPHEN 7.5-325 MG PO TABS
1.0000 | ORAL_TABLET | Freq: Two times a day (BID) | ORAL | Status: DC | PRN
Start: 2020-08-19 — End: 2020-11-03

## 2020-08-19 NOTE — Evaluation (Signed)
Occupational Therapy Evaluation Patient Details Name: Michaela Rios MRN: [id number] DOB: 1950-12-26 Today's Date: 08/19/2020    History of Present Illness 37yow PMH anxiety on Ativan BID, HTN presented w/ 3 days n/v, unable to keep BZD down; intermitten panic attacks, tremors. Admitted for dehydration, elevated LFTs, hypokalemia, n/v. Recent shingles RUE/hand--right hand dominant, still has some pain and swelling and numbness of right hand and difficulty   Clinical Impression   Patient admitted for above diagnosis.  OT consult placed for new onset of R hand muscle weakness due to shingles.  OT to put together HEP sheet, provide foam to build up handles and provide squeeze ball.  Patient is generally independent at home, R hand weakness has impacted her complete independence.  Her spouse will assist her at times.  She is at or near her baseline in the acute setting.  OT to follow in acute, HEP should be enough to help her regain function.  Will discuses outpatient options for the future.       Follow Up Recommendations  No OT follow up    Equipment Recommendations  None recommended by OT    Recommendations for Other Services       Precautions / Restrictions Precautions Precautions: Fall Restrictions Weight Bearing Restrictions: No      Mobility Bed Mobility Overal bed mobility: Modified Independent                Transfers Overall transfer level: Modified independent (holding IV pole, slowed pace)                    Balance Overall balance assessment: Modified Independent                                         ADL either performed or assessed with clinical judgement   ADL Overall ADL's : Modified independent                                       General ADL Comments: Husband will assist with opening things and lifting objects for her - R hand weakness.  But generally she is able to complete most tasks through  compensation.     Vision Baseline Vision/History: Wears glasses Wears Glasses: At all times Patient Visual Report: No change from baseline       Perception     Praxis      Pertinent Vitals/Pain Pain Assessment: 0-10 Pain Score: 2  Pain Descriptors / Indicators: Dull Pain Intervention(s): Monitored during session     Hand Dominance Right   Extremity/Trunk Assessment Upper Extremity Assessment Upper Extremity Assessment: RUE deficits/detail RUE Deficits / Details: weakness and decreased muscle mass to intrinsic hand mm. RUE Sensation: decreased light touch RUE Coordination: WNL (using L hand primarily.)           Communication Communication Communication: No difficulties   Cognition Arousal/Alertness: Awake/alert Behavior During Therapy: WFL for tasks assessed/performed Overall Cognitive Status: Within Functional Limits for tasks assessed                                     General Comments  WFL's    Exercises     Shoulder Instructions      Home  Living Family/patient expects to be discharged to:: Private residence Living Arrangements: Spouse/significant other Available Help at Discharge: Family Type of Home: House Home Access: Level entry     [place]: One level     Bathroom Shower/Tub: Occupational psychologist: [place] Accessibility: Yes How Accessible: Accessible via walker Home Equipment: Shower seat          Prior Functioning/Environment Level of Independence: Independent                 OT Problem List: Decreased strength      OT Treatment/Interventions: Therapeutic exercise;DME and/or AE instruction;Therapeutic activities    OT Goals(Current goals can be found in the care plan section) Acute Rehab OT Goals Patient Stated Goal: I'm right handed, I'd like to use it more. OT Goal Formulation: With patient Time For Goal Achievement: 08/24/20 Potential to Achieve Goals: Good ADL Goals Pt Will  Perform Eating: with modified independence;with adaptive utensils Pt Will Perform Grooming: Independently;standing Pt Will Perform Lower Body Dressing: Independently;sit to/from stand Pt Will Transfer to Toilet: Independently;ambulating Pt/caregiver will Perform Home Exercise Program: Increased strength;Right Upper extremity;Independently  OT Frequency: Min 2X/week   Barriers to D/C:    none noted       Co-evaluation              AM-PAC OT "6 Clicks" Daily Activity     Outcome Measure Help from another person eating meals?: A Little Help from another person taking care of personal grooming?: A Little Help from another person toileting, which includes using toliet, bedpan, or urinal?: None Help from another person bathing (including washing, rinsing, drying)?: A Little Help from another person to put on and taking off regular upper body clothing?: A Little Help from another person to put on and taking off regular lower body clothing?: A Little 6 Click Score: 19   End of Session Nurse Communication: Mobility status  Activity Tolerance: Patient tolerated treatment well Patient left: in bed;with call bell/phone within reach;with bed alarm set;with family/visitor present  OT Visit Diagnosis: Muscle weakness (generalized) (M62.81)                Time: 0034-9179 OT Time Calculation (min): 24 min Charges:  OT General Charges $OT Visit: 1 Visit OT Evaluation $OT Eval Moderate Complexity: 1 Mod OT Treatments $Therapeutic Activity: 8-22 mins  08/19/2020  Rich, OTR/L  Acute Rehabilitation Services  Office:  [phone]   Michaela Rios 08/19/2020, 10:01 AM

## 2020-08-19 NOTE — Telephone Encounter (Signed)
Patient called and was wondering if she can get a referral for a hand specialist.  She states that something is not right with her right index and middle finger. She says that she cannot write.

## 2020-08-19 NOTE — Discharge Summary (Signed)
Physician Discharge Summary  Michaela Rios QQP:[id number] DOB: June 07, 1951 DOA: 08/17/2020  PCP: Binnie Rail, MD  Admit date: 08/17/2020 Discharge date: 08/19/2020  Admitted From: Home  Discharge disposition: Home  Recommendations for Outpatient Follow-Up:   . Follow up with your primary care provider in one week.  . Check CBC, BMP, magnesium, LFTS, CK level in the next visit . Follow up with ultrasound for Sludge of gall bladder.    Discharge Diagnosis:   Principal Problem:   Nausea & vomiting Active Problems:   Anxiety   Essential hypertension   GERD   Prediabetes   Elevated LFTs   Hypokalemia   Elevated troponin   Rhabdomyolysis  Discharge Condition: Improved.  Diet recommendation: Low sodium, heart healthy.    Wound care: None.  Code status: Full.   History of Present Illness:  69 years old female with PMH anxiety on Ativan BID, HTN presented to the [facility] with 3 days history of nausea vomiting and unable to keep food and benzodiazepines down.  She does have history of intermittent panic attacks tremors.  Patient was then admitted to [facility] for dehydration, elevated LFTs hypokalemia and nausea vomiting.    [facility] Course:   Following conditions were addressed during hospitalization as listed below,  Nausea & vomiting Could be secondary to UTI.  Mild elevated lipase and LFTs without any abdominal pain.  LFTs trending down.  Right upper quadrant ultrasound showed some gallstones versus sludge but no cholecystitis.  Nausea and vomiting has subsided at this time.  Patient was advised to follow-up with her primary care physician as outpatient if she developed any abdominal pain.    Elevated LFTs Without abdominal pain.  Trending down.  Lipase was mildly elevated.  Will need outpatient follow-up  Rhabdomyolysis --1086 > 830.  Improved with IV fluids.  TSH within normal limits.  Not on statin.  UTI Symptomatic.  Bactrim DS on  discharge.  Anxiety Continue benzodiazepines from home.  On chronic benzodiazepines.  Elevated troponin Mild elevation without chest pain or EKG changes.  2D echocardiogram with hyperdynamic ventricles with no wall motion abnormality.  Acute coronary syndrome has been ruled out.    Recent shingles RUE/hand  Disposition.  At this time, patient is stable for disposition home.  Medical Consultants:    None.  Procedures:    none Subjective:   Today, patient was seen and examined at bedside.  No nausea vomiting abdominal pain fever or chills.  Discharge Exam:   Vitals:   08/19/20 0849 08/19/20 0850  BP: (!) 156/100 (!) 156/100  Pulse: 93   Resp:    Temp:    SpO2: 91%    Vitals:   08/19/20 0112 08/19/20 0305 08/19/20 0849 08/19/20 0850  BP:  (!) 166/97 (!) 156/100 (!) 156/100  Pulse:  85 93   Resp:  16    Temp:  98.1 F (36.7 C)    TempSrc:  Oral    SpO2:  94% 91%   Weight: 62.6 kg     Height:       General: Alert awake, not in obvious distress HENT: pupils equally reacting to light,  No scleral pallor or icterus noted. Oral mucosa is moist.  Chest:  Clear breath sounds.  Diminished breath sounds bilaterally. No crackles or wheezes.  CVS: S1 &S2 heard. No murmur.  Regular rate and rhythm. Abdomen: Soft, nontender, nondistended.  Bowel sounds are heard.   Extremities: No cyanosis, clubbing or edema.  Peripheral pulses are palpable. Psych: Alert,  awake and oriented, normal mood CNS:  No cranial nerve deficits.  Power equal in all extremities.   Skin: Warm and dry.  No rashes noted.  The results of significant diagnostics from this hospitalization (including imaging, microbiology, ancillary and laboratory) are listed below for reference.     Diagnostic Studies:   CT Head Wo Contrast  Result Date: 08/17/2020 CLINICAL DATA:  Syncope, headache EXAM: CT HEAD WITHOUT CONTRAST TECHNIQUE: Contiguous axial images were obtained from the base of the skull through the  vertex without intravenous contrast. COMPARISON:  None. FINDINGS: Brain: No evidence of acute infarction, hemorrhage, hydrocephalus, extra-axial collection or mass lesion/mass effect. Vascular: Atherosclerotic calcifications involving the large vessels of the skull base. No unexpected hyperdense vessel. Skull: Normal. Negative for fracture or focal lesion. Sinuses/Orbits: No acute finding. Other: None. IMPRESSION: No acute intracranial findings. Electronically Signed   By: Michaela Rios D.O.   On: 08/17/2020 13:04   US Abdomen Limited  Result Date: 08/17/2020 CLINICAL DATA:  Right upper quadrant pain, nausea and vomiting EXAM: ULTRASOUND ABDOMEN LIMITED RIGHT UPPER QUADRANT COMPARISON:  02/03/2011 FINDINGS: Gallbladder: Small nonshadowing gallstones versus tumefactive sludge noted within the gallbladder lumen. No evidence of shadowing gallstones or cholecystitis. No gallbladder wall thickening or pericholecystic fluid. Common bile duct: Diameter: 6 mm Liver: No focal lesion identified. Within normal limits in parenchymal echogenicity. Portal vein is patent on color Doppler imaging with normal direction of blood flow towards the liver. Other: None. IMPRESSION: 1. Small nonshadowing gallstones versus tumefactive sludge. No evidence of cholecystitis. Electronically Signed   By: Michaela Rios M.D.   On: 08/17/2020 16:46   DG Chest Port 1 View  Result Date: 08/17/2020 CLINICAL DATA:  Weakness, tachycardia, nausea and vomiting EXAM: PORTABLE CHEST 1 VIEW COMPARISON:  09/26/2011 FINDINGS: Single frontal view of the chest demonstrates a stable cardiac silhouette. There is stable ectasia of the thoracic aorta. No airspace disease, effusion, or pneumothorax. No acute bony abnormality. IMPRESSION: 1. No acute intrathoracic process. Electronically Signed   By: Michaela Rios M.D.   On: 08/17/2020 18:56   ECHOCARDIOGRAM COMPLETE  Result Date: 08/18/2020    ECHOCARDIOGRAM REPORT   Patient Name:   Michaela Rios Michaela Rios  Date of Exam: 08/18/2020 Medical Rec #:  [id number]         Height:       63.0 in Accession #:    [id number]        Weight:       134.2 lb Date of Birth:  05-19-1951         BSA:          32.632 m Patient Age:    69 years          BP:           155/115 mmHg Patient Gender: F                 HR:           119 bpm. Exam Location:  Inpatient Procedure: 2D Echo Indications:    elevated troponin  History:        Patient has no prior history of Echocardiogram examinations.                 Chronic kidney disease.  Sonographer:    Johny Chess Referring Phys: [place]  1. Left ventricular ejection fraction, by estimation, is 70 to 75%. The left ventricle has hyperdynamic function. The left ventricle has no regional wall motion abnormalities. There  is mild concentric left ventricular hypertrophy. Indeterminate diastolic filling due to E-A fusion.  2. Right ventricular systolic function is normal. The right ventricular size is normal.  3. The mitral valve is normal in structure. No evidence of mitral valve regurgitation. No evidence of mitral stenosis.  4. The aortic valve is normal in structure. Aortic valve regurgitation is not visualized. No aortic stenosis is present.  5. There is borderline dilatation of the ascending aorta, measuring 37 mm.  6. The inferior vena cava is normal in size with greater than 50% respiratory variability, suggesting right atrial pressure of 3 mmHg. FINDINGS  Left Ventricle: Left ventricular ejection fraction, by estimation, is 70 to 75%. The left ventricle has hyperdynamic function. The left ventricle has no regional wall motion abnormalities. The left ventricular internal cavity size was normal in size. There is mild concentric left ventricular hypertrophy. Indeterminate diastolic filling due to E-A fusion. Right Ventricle: The right ventricular size is normal. No increase in right ventricular wall thickness. Right ventricular systolic function is normal. Left  Atrium: Left atrial size was normal in size. Right Atrium: Right atrial size was normal in size. Pericardium: There is no evidence of pericardial effusion. Mitral Valve: The mitral valve is normal in structure. No evidence of mitral valve regurgitation. No evidence of mitral valve stenosis. Tricuspid Valve: The tricuspid valve is normal in structure. Tricuspid valve regurgitation is not demonstrated. No evidence of tricuspid stenosis. Aortic Valve: The aortic valve is normal in structure. Aortic valve regurgitation is not visualized. No aortic stenosis is present. Pulmonic Valve: The pulmonic valve was normal in structure. Pulmonic valve regurgitation is not visualized. No evidence of pulmonic stenosis. Aorta: The aortic root is normal in size and structure. There is borderline dilatation of the ascending aorta, measuring 37 mm. Venous: The inferior vena cava is normal in size with greater than 50% respiratory variability, suggesting right atrial pressure of 3 mmHg. IAS/Shunts: No atrial level shunt detected by color flow Doppler.  LEFT VENTRICLE PLAX 2D LVIDd:         3.30 cm  Diastology LVIDs:         2.10 cm  LV e' medial:  5.87 cm/s LV PW:         1.30 cm  LV e' lateral: 7.51 cm/s LV IVS:        1.30 cm LVOT diam:     2.10 cm LV SV:         67 LV SV Index:   41 LVOT Area:     3.46 cm  RIGHT VENTRICLE RV S prime:     23.90 cm/s TAPSE (M-mode): 1.5 cm LEFT ATRIUM             Index       RIGHT ATRIUM           Index LA Vol (A2C):   31.9 ml 19.55 ml/m RA Area:     10.10 cm LA Vol (A4C):   32.0 ml 19.61 ml/m RA Volume:   18.70 ml  11.46 ml/m LA Biplane Vol: 32.4 ml 19.85 ml/m  AORTIC VALVE LVOT Vmax:   115.00 cm/s LVOT Vmean:  82.400 cm/s LVOT VTI:    0.192 m  AORTA Ao Root diam: 3.00 cm Ao Asc diam:  3.70 cm  SHUNTS Systemic VTI:  0.19 m Systemic Diam: 2.10 cm Dani Gobble Croitoru MD Electronically signed by Sanda Klein MD Signature Date/Time: 08/18/2020/1:05:37 PM    Final      Labs:   Basic Metabolic  Panel: Recent Labs  Lab 08/17/20 0203 08/17/20 0203 08/17/20 1149 08/17/20 2118 08/17/20 2241 08/17/20 2241 08/18/20 0021 08/18/20 0021 08/18/20 0454 08/19/20 0745  NA 138  --   --   --  139  --  141  --  140 137  K 3.1*   < >  --   --  3.6   < > 3.7   < > 3.2* 3.6  CL 104  --   --   --  106  --   --   --  110 107  CO2 18*  --   --   --  19*  --   --   --  20* 19*  GLUCOSE 111*  --   --   --  93  --   --   --  88 98  BUN 37*  --   --   --  31*  --   --   --  27* 8  CREATININE 1.31*  --   --   --  0.93  --   --   --  0.89 0.72  CALCIUM 10.1  --   --   --  9.4  --   --   --  9.1 9.2  MG  --   --  2.4 2.2  --   --   --   --  2.0  --   PHOS  --   --   --  3.4  --   --   --   --  3.1  --    < > = values in this interval not displayed.   GFR Estimated Creatinine Clearance: 54.9 mL/min (by C-G formula based on SCr of 0.72 mg/dL). Liver Function Tests: Recent Labs  Lab 08/17/20 1149 08/17/20 2241 08/18/20 0454 08/19/20 0745  AST 116* 116* 98* 60*  ALT 80* 79* 72* 55*  ALKPHOS 68 58 53 57  BILITOT 1.5* 1.5* 1.4* 1.0  PROT 9.0* 7.1 7.2 7.2  ALBUMIN 4.6 3.9 3.5 3.8   Recent Labs  Lab 08/17/20 1149 08/19/20 0745  LIPASE 197* 344*   Recent Labs  Lab 08/17/20 2110  AMMONIA 35   Coagulation profile No results for input(s): INR, PROTIME in the last 168 hours.  CBC: Recent Labs  Lab 08/17/20 0203 08/18/20 0021 08/18/20 0454  WBC 6.0  --  4.1  NEUTROABS  --   --  2.3  HGB 13.5 13.6 11.7*  HCT 41.2 40.0 35.4*  MCV 91.8  --  93.9  PLT 294  --  208   Cardiac Enzymes: Recent Labs  Lab 08/17/20 2118 08/18/20 0454 08/19/20 0745  CKTOTAL 1,086* 820* 569*   BNP: Invalid input(s): POCBNP CBG: Recent Labs  Lab 08/18/20 0343 08/18/20 0852 08/18/20 1120 08/18/20 1620 08/18/20 2009  GLUCAP 88 101* 95 110* 100*   D-Dimer Recent Labs    08/17/20 1830  DDIMER 0.50   Hgb A1c No results for input(s): HGBA1C in the last 72 hours. Lipid Profile No results  for input(s): CHOL, HDL, LDLCALC, TRIG, CHOLHDL, LDLDIRECT in the last 72 hours. Thyroid function studies Recent Labs    08/18/20 0454  TSH 0.884   Anemia work up No results for input(s): VITAMINB12, FOLATE, FERRITIN, TIBC, IRON, RETICCTPCT in the last 72 hours. Microbiology Recent Results (from the past 240 hour(s))  SARS Coronavirus 2 by RT PCR ([facility] order, performed in [facility]) Nasopharyngeal Nasopharyngeal Swab     Status: None  Collection Time: 08/17/20 12:04 PM   Specimen: Nasopharyngeal Swab  Result Value Ref Range Status   SARS Coronavirus 2 NEGATIVE NEGATIVE Final    Comment: (NOTE) SARS-CoV-2 target nucleic acids are NOT DETECTED.  The SARS-CoV-2 RNA is generally detectable in upper and lower respiratory specimens during the acute phase of infection. The lowest concentration of SARS-CoV-2 viral copies this assay can detect is 250 copies / mL. A negative result does not preclude SARS-CoV-2 infection and should not be used as the sole basis for treatment or other patient management decisions.  A negative result may occur with improper specimen collection / handling, submission of specimen other than nasopharyngeal swab, presence of viral mutation(s) within the areas targeted by this assay, and inadequate number of viral copies (<250 copies / mL). A negative result must be combined with clinical observations, patient history, and epidemiological information.  Fact Sheet for Patients:   StrictlyIdeas.no  Fact Sheet for Healthcare Providers: BankingDealers.co.za  This test is not yet approved or  cleared by the [place] FDA and has been authorized for detection and/or diagnosis of SARS-CoV-2 by FDA under an Emergency Use Authorization (EUA).  This EUA will remain in effect (meaning this test can be used) for the duration of the COVID-19 declaration under Section 564(b)(1) of the Act, 21  U.S.C. section 360bbb-3(b)(1), unless the authorization is terminated or revoked sooner.  Performed at [facility], [place] [address] [place], [place] [address]      Discharge Instructions:   Discharge Instructions    Call MD for:  persistant nausea and vomiting   Complete by: As directed    Call MD for:  temperature >100.4   Complete by: As directed    Diet - low sodium heart healthy   Complete by: As directed    Discharge instructions   Complete by: As directed    Follow up with your primary care physician in one week. Check blood work at that time.   Increase activity slowly   Complete by: As directed      Allergies as of 08/19/2020      Reactions   Shellfish Allergy Nausea And Vomiting, Other (See Comments)   Tested "high" on allergy test   Amoxicillin Hives   Cephalexin Hives   Lisinopril Cough   .   Penicillins Hives      Medication List    STOP taking these medications   gabapentin 100 MG capsule Commonly known as: NEURONTIN     TAKE these medications   acetaminophen 650 MG CR tablet Commonly known as: TYLENOL Take 650-1,300 mg by mouth every 8 (eight) hours as needed for pain.   amLODipine 5 MG tablet Commonly known as: NORVASC Take 1 tablet (5 mg total) by mouth daily.   buPROPion 300 MG 24 hr tablet Commonly known as: Wellbutrin XL Take 1 tablet (300 mg total) by mouth daily.   cyclobenzaprine 10 MG tablet Commonly known as: FLEXERIL Take 1 tablet (10 mg total) by mouth 3 (three) times daily as needed for muscle spasms. What changed:   when to take this  reasons to take this   EPINEPHrine 0.3 mg/0.3 mL Soaj injection Commonly known as: EpiPen 2-Pak Inject 0.3 mLs (0.3 mg total) into the muscle as needed for anaphylaxis.   furosemide 20 MG tablet Commonly known as: LASIX Take 1 tablet (20 mg total) by mouth daily.   hydrALAZINE 25 MG tablet Commonly known as: APRESOLINE Take 1 tablet (25 mg total) by mouth in  the morning and at  bedtime.   HYDROcodone-acetaminophen 7.5-325 MG tablet Commonly known as: NORCO Take 1 tablet by mouth 2 (two) times daily as needed (migraine headache).   LORazepam 1 MG tablet Commonly known as: ATIVAN Take 1 tablet (1 mg total) by mouth 2 (two) times daily.   metoprolol succinate 100 MG 24 hr tablet Commonly known as: TOPROL-XL Take 1 tablet (100 mg total) by mouth daily. Take with or immediately following a meal.   omeprazole 20 MG capsule Commonly known as: PRILOSEC Take 1 capsule (20 mg total) by mouth daily. What changed:   when to take this  reasons to take this   ondansetron 4 MG tablet Commonly known as: ZOFRAN Take 1 tablet (4 mg total) by mouth every 6 (six) hours as needed for nausea.   sulfamethoxazole-trimethoprim 800-160 MG tablet Commonly known as: BACTRIM DS Take 1 tablet by mouth every 12 (twelve) hours for 2 days.   SUMAtriptan 100 MG tablet Commonly known as: IMITREX Take 100 mg by mouth See admin instructions. Take one tablet (100 mg) by mouth at onset of migraine headache, may repeat in 2 hours if still needed   tiZANidine 4 MG tablet Commonly known as: ZANAFLEX Take 4 mg by mouth 2 (two) times daily as needed for muscle spasms.   topiramate 50 MG tablet Commonly known as: TOPAMAX Take 75 mg by mouth daily.   venlafaxine XR 75 MG 24 hr capsule Commonly known as: EFFEXOR-XR Take 225 mg by mouth daily.   VITAMIN D3 PO Take 1 tablet by mouth daily.   zolpidem 10 MG tablet Commonly known as: AMBIEN TAKE 1 TABLET BY MOUTH AT BEDTIME AS NEEDED FOR SLEEP What changed:   how much to take  how to take this  when to take this  additional instructions        Time coordinating discharge: 39 minutes  Signed:  Sadik Piascik  [facility] 08/19/2020, 10:56 AM

## 2020-08-19 NOTE — Telephone Encounter (Signed)
Referral ordered

## 2020-08-19 NOTE — Progress Notes (Signed)
D/C instructions given and reviewed. No questions voiced at this time but encouraged to call with any concerns. Tele and IV removed. Tolerated well.

## 2020-08-19 NOTE — TOC Progression Note (Signed)
Transition of Care [facility]) - Progression Note    Patient Details  Name: Michaela Rios MRN: [id number] Date of Birth: [birth year]  [facility] CM/SW Contact  Zenon Mayo, RN Phone Number: 08/19/2020, 9:54 AM  Clinical Narrative:    NCM gave substance abuse resources to Staff RN to give to patient.          Expected Discharge Plan and Services                                                 Social Determinants of Health (SDOH) Interventions    Readmission Risk Interventions No flowsheet data found.

## 2020-08-20 NOTE — Telephone Encounter (Signed)
Called pt there was no answer LMOM w/MD response../lmb 

## 2020-08-21 ENCOUNTER — Telehealth: Payer: Self-pay | Admitting: Pharmacist

## 2020-08-21 NOTE — Progress Notes (Signed)
Received call from patient regarding medication management via [facility].  Patient requested an acute fill for zolpidem 10 mg #30 to be delivered: 08/24/20 Pharmacy needs refills? No  Confirmed delivery date of 08/24/20, advised patient that pharmacy will contact them the morning of delivery.  Charlton Haws, [facility]

## 2020-08-28 ENCOUNTER — Telehealth: Payer: Medicare HMO

## 2020-09-02 ENCOUNTER — Telehealth: Payer: Self-pay | Admitting: Pharmacist

## 2020-09-02 NOTE — Progress Notes (Addendum)
Chronic Care Management Pharmacy Assistant   Name: Michaela Rios  MRN: [id number] DOB: 1951-08-30  Reason for Encounter: Medication Review  Patient Questions:  1.  Have you seen any other providers since your last visit? No  2.  Any changes in your medicines or health? No   PCP : Binnie Rail, MD  Allergies:   Allergies  Allergen Reactions  . Shellfish Allergy Nausea And Vomiting and Other (See Comments)    Tested "high" on allergy test  . Amoxicillin Hives  . Cephalexin Hives  . Lisinopril Cough    .  Marland Kitchen Penicillins Hives    Medications: Outpatient Encounter Medications as of 09/02/2020  Medication Sig Note  . acetaminophen (TYLENOL) 650 MG CR tablet Take 650-1,300 mg by mouth every 8 (eight) hours as needed for pain.   Marland Kitchen amLODipine (NORVASC) 5 MG tablet Take 1 tablet (5 mg total) by mouth daily.   Marland Kitchen buPROPion (WELLBUTRIN XL) 300 MG 24 hr tablet Take 1 tablet (300 mg total) by mouth daily.   . Cholecalciferol (VITAMIN D3 PO) Take 1 tablet by mouth daily.   . cyclobenzaprine (FLEXERIL) 10 MG tablet Take 1 tablet (10 mg total) by mouth 3 (three) times daily as needed for muscle spasms. (Patient taking differently: Take 10 mg by mouth at bedtime as needed (headache). )   . EPINEPHrine (EPIPEN 2-PAK) 0.3 mg/0.3 mL IJ SOAJ injection Inject 0.3 mLs (0.3 mg total) into the muscle as needed for anaphylaxis.   . furosemide (LASIX) 20 MG tablet Take 1 tablet (20 mg total) by mouth daily.   . hydrALAZINE (APRESOLINE) 25 MG tablet Take 1 tablet (25 mg total) by mouth in the morning and at bedtime.   Marland Kitchen HYDROcodone-acetaminophen (NORCO) 7.5-325 MG tablet Take 1 tablet by mouth 2 (two) times daily as needed (migraine headache).   . LORazepam (ATIVAN) 1 MG tablet Take 1 tablet (1 mg total) by mouth 2 (two) times daily.   . metoprolol succinate (TOPROL-XL) 100 MG 24 hr tablet Take 1 tablet (100 mg total) by mouth daily. Take with or immediately following a meal. 08/17/2020: Pt does not  remember exact date of last dose but states that she usually takes at 11am  . omeprazole (PRILOSEC) 20 MG capsule Take 1 capsule (20 mg total) by mouth daily. (Patient taking differently: Take 20 mg by mouth daily as needed (acid reflux). )   . ondansetron (ZOFRAN) 4 MG tablet Take 1 tablet (4 mg total) by mouth every 6 (six) hours as needed for nausea.   . SUMAtriptan (IMITREX) 100 MG tablet Take 100 mg by mouth See admin instructions. Take one tablet (100 mg) by mouth at onset of migraine headache, may repeat in 2 hours if still needed   . tiZANidine (ZANAFLEX) 4 MG tablet Take 4 mg by mouth 2 (two) times daily as needed for muscle spasms.    Marland Kitchen topiramate (TOPAMAX) 50 MG tablet Take 75 mg by mouth daily.    Marland Kitchen venlafaxine XR (EFFEXOR-XR) 75 MG 24 hr capsule Take 225 mg by mouth daily.    Marland Kitchen zolpidem (AMBIEN) 10 MG tablet TAKE 1 TABLET BY MOUTH AT BEDTIME AS NEEDED FOR SLEEP (Patient taking differently: Take 10 mg by mouth at bedtime. )    No facility-administered encounter medications on file as of 09/02/2020.    Current Diagnosis: Patient Active Problem List   Diagnosis Date Noted  . Rhabdomyolysis 08/18/2020  . Nausea & vomiting 08/18/2020  . Intractable nausea and vomiting  08/18/2020  . Elevated LFTs 08/17/2020  . Hypokalemia 08/17/2020  . Elevated troponin 08/17/2020  . Tinea corporis 06/30/2020  . Herpes zoster without complication 05/27/1600  . Urinary urgency 04/06/2020  . Left lumbar radiculopathy 06/04/2018  . CKD (chronic kidney disease) stage 3, GFR 30-59 ml/min (HCC) 06/04/2018  . Pain management contract signed 06/03/2018  . Encounter for pain management 03/05/2018  . Memory difficulties 03/05/2018  . Hormone replacement therapy (HRT) 12/04/2017  . Sialadenitis, right parotid 06/24/2017  . Insomnia 05/12/2017  . Prediabetes 11/08/2016  . Vitamin D deficiency 06/08/2009  . Depression 01/07/2009  . PEPTIC ULCER DISEASE, CHRONIC 01/07/2009  . OTHER OBSTRUCTION OF DUODENUM  01/07/2009  . HIATAL HERNIA 01/07/2009  . Irritable bowel syndrome 01/07/2009  . DEGENERATIVE JOINT DISEASE 01/07/2009  . ANEMIA, HX OF 01/07/2009  . PULMONARY NODULE 06/09/2008  . Anxiety 04/01/2008  . Essential hypertension 04/01/2008  . Allergic rhinitis 04/01/2008  . GERD 04/01/2008  . LOW BACK PAIN SYNDROME 04/01/2008  . Chronic migraine 04/01/2008    Goals Addressed   None     Follow-Up:  Coordination of Enhanced Pharmacy Services   Reviewed chart for medication changes ahead of medication coordination call.  No OVs, Consults, or hospital visits since last care coordination call/Pharmacist visit. (No medication changes indicated  BP Readings from Last 3 Encounters:  08/19/20 (!) 156/100  07/29/20 138/84  07/29/20 138/84    Lab Results  Component Value Date   HGBA1C 5.5 04/06/2020     Patient obtains medications through Vials  90 Days   Last adherence delivery included:  Zolpidem 10 mg; take one tab daily at bedtime Gabapentin 100 mg; take 1-2 cap every morning and 4 cap at bedtime Omperazole 20 mg; take one cap daily Topiramate 100 mg; take one tab at bedtime Metoprolol Succinate ER 100 mg; take one tab in the morning Furosemide 20 mg; take one tab in the morming Hydralazine 25 mg; take one tab every morning Amlodipine 5 mg; take one tab every morning Venlafaxine Er 75 mg; take three cap every morning Bupropoin Hcl Xl 300 mg; take one tab every morning    Patient is due for next adherence delivery on:11/03/20. Called patient and reviewed medications and coordinated delivery.  Patient declined the following medications: Zolpidem 10 mg; take one tab daily at bedtime Gabapentin 100 mg; take 1-2 cap every morning and 4 cap at bedtime Omperazole 20 mg; take one cap daily Topiramate 100 mg; take one tab at bedtime Metoprolol Succinate ER 100 mg; take one tab in the morning Furosemide 20 mg; take one tab in the morming Hydralazine 25 mg; take one tab every  morning Amlodipine 5 mg; take one tab every morning Venlafaxine Er 75 mg; take three cap every morning Bupropoin Hcl Xl 300 mg; take one tab every morning  due to the patient receiving 90 day supply on 08/03/20 supply ends on 11/01/20   [name], [place] Team Manager/ CPA (Clinical Pharmacist Assistant) [phone]

## 2020-09-08 ENCOUNTER — Ambulatory Visit: Payer: Medicare HMO | Admitting: Orthopaedic Surgery

## 2020-09-08 ENCOUNTER — Encounter: Payer: Self-pay | Admitting: Orthopaedic Surgery

## 2020-09-08 DIAGNOSIS — R202 Paresthesia of skin: Secondary | ICD-10-CM

## 2020-09-08 DIAGNOSIS — R200 Anesthesia of skin: Secondary | ICD-10-CM | POA: Insufficient documentation

## 2020-09-08 MED ORDER — AMITRIPTYLINE HCL 25 MG PO TABS: 25.0000 mg | ORAL_TABLET | Freq: Every day | ORAL | 2 refills | Status: DC

## 2020-09-08 NOTE — Progress Notes (Signed)
Office Visit Note   Patient: Michaela Rios           Date of Birth: 1951/07/21           MRN: [id number] Visit Date: 09/08/2020              Requested by: Binnie Rail, MD [place],  [place] [address] PCP: Binnie Rail, MD   Assessment & Plan: Visit Diagnoses:  1. Numbness and tingling in right hand     Plan: Impression is post shingles numbness and tingling of the right hand.  Given the fact that it has only been 6 weeks I recommend giving this more time as I feel that this has a very good chance of being self-limited and going on to full resolution.  She has tried gabapentin but she was allergic to this.  I recommended trying some amitriptyline at nighttime to see if this will help.  I recommended that she follow-up with [place] in a couple months if she does not notice any improvement but she understands that it can take some time for nerves to recover from injury.  Questions encouraged and answered.  Follow-up as needed.  Follow-Up Instructions: Return if symptoms worsen or fail to improve.   Orders:  No orders of the defined types were placed in this encounter.  Meds ordered this encounter  Medications  . amitriptyline (ELAVIL) 25 MG tablet    Sig: Take 1 tablet (25 mg total) by mouth at bedtime.    Dispense:  30 tablet    Refill:  2      Procedures: No procedures performed   Clinical Data: No additional findings.   Subjective: Chief Complaint  Patient presents with  . Right Hand - Pain    [name] is a 69 year old female referral from PCP for evaluation of right hand numbness and tingling to the index and long fingertips.  She had shingles approximately 6 weeks ago that involved her right arm and hand and she developed lesions that were originally painful.  She no longer has pain over the lesions but she now has numbness in the fingertips of the index and long finger.  She also has an itchy feeling at times.  She feels some weakness and grasping  as well.  She has some pain when she makes a fist.   Review of Systems  Constitutional: Negative.   HENT: Negative.   Eyes: Negative.   Respiratory: Negative.   Cardiovascular: Negative.   Endocrine: Negative.   Musculoskeletal: Negative.   Neurological: Negative.   Hematological: Negative.   Psychiatric/Behavioral: Negative.   All other systems reviewed and are negative.    Objective: Vital Signs: There were no vitals taken for this visit.  Physical Exam Vitals and nursing note reviewed.  Constitutional:      Appearance: She is well-developed.  HENT:     Head: Normocephalic and atraumatic.  Pulmonary:     Effort: Pulmonary effort is normal.  Abdominal:     Palpations: Abdomen is soft.  Musculoskeletal:     Cervical back: Neck supple.  Skin:    General: Skin is warm.     Capillary Refill: Capillary refill takes less than 2 seconds.  Neurological:     Mental Status: She is alert and oriented to person, place, and time.  Psychiatric:        Behavior: Behavior normal.        Thought Content: Thought content normal.  Judgment: Judgment normal.     Ortho Exam Right hand shows no swelling or skin lesions or masses.  No temperature differences or changes.  Motor and sensory are intact except for sensation to the index and long fingertips.  Negative carpal tunnel compressive signs.  No allodynia or hyperalgesia.  She has got normal range of motion of her fingers. Specialty Comments:  No specialty comments available.  Imaging: No results found.   PMFS History: Patient Active Problem List   Diagnosis Date Noted  . Numbness and tingling in right hand 09/08/2020  . Rhabdomyolysis 08/18/2020  . Nausea & vomiting 08/18/2020  . Intractable nausea and vomiting 08/18/2020  . Elevated LFTs 08/17/2020  . Hypokalemia 08/17/2020  . Elevated troponin 08/17/2020  . Tinea corporis 06/30/2020  . Herpes zoster without complication 27/78/2423  . Urinary urgency 04/06/2020   . Left lumbar radiculopathy 06/04/2018  . CKD (chronic kidney disease) stage 3, GFR 30-59 ml/min (HCC) 06/04/2018  . Pain management contract signed 06/03/2018  . Encounter for pain management 03/05/2018  . Memory difficulties 03/05/2018  . Hormone replacement therapy (HRT) 12/04/2017  . Sialadenitis, right parotid 06/24/2017  . Insomnia 05/12/2017  . Prediabetes 11/08/2016  . Vitamin D deficiency 06/08/2009  . Depression 01/07/2009  . PEPTIC ULCER DISEASE, CHRONIC 01/07/2009  . OTHER OBSTRUCTION OF DUODENUM 01/07/2009  . HIATAL HERNIA 01/07/2009  . Irritable bowel syndrome 01/07/2009  . DEGENERATIVE JOINT DISEASE 01/07/2009  . ANEMIA, HX OF 01/07/2009  . PULMONARY NODULE 06/09/2008  . Anxiety 04/01/2008  . Essential hypertension 04/01/2008  . Allergic rhinitis 04/01/2008  . GERD 04/01/2008  . LOW BACK PAIN SYNDROME 04/01/2008  . Chronic migraine 04/01/2008   Past Medical History:  Diagnosis Date  . Anemia   . Anxiety   . Arthritis   . Cataract   . Chronic migraine    follows with neuro for same  . Clotting disorder (HCC)    clot in ovarian vein- hx  . Esophagitis   . GERD (gastroesophageal reflux disease)   . Hypertension   . IBS (irritable bowel syndrome)   . Low back pain syndrome   . MRSA (methicillin resistant Staphylococcus aureus) infection 04/17/12   left torso  . Peptic ulcer disease 2003, 12/2012  . Peripheral vascular disease ([place])   . Vitamin D deficiency     Family History  Problem Relation Age of Onset  . Aneurysm Father   . Prostate cancer Brother   . Lung cancer Brother   . Colon cancer Neg Hx   . Esophageal cancer Neg Hx   . Rectal cancer Neg Hx   . Stomach cancer Neg Hx     Past Surgical History:  Procedure Laterality Date  . ABDOMINAL HYSTERECTOMY    . LAPAROSCOPIC BILATERAL SALPINGO OOPHERECTOMY    . pud surgery w/duod sticture-plasty and vagotomy  2003   Dr. Marlou Starks  . TONSILLECTOMY AND ADENOIDECTOMY     as a child  . TUBAL LIGATION   1984   Social History   Occupational History  . Occupation: retired  Tobacco Use  . Smoking status: Never Smoker  . Smokeless tobacco: Never Used  Vaping Use  . Vaping Use: Never used  Substance and Sexual Activity  . Alcohol use: Yes    Comment: rare  . Drug use: No  . Sexual activity: Yes

## 2020-09-11 ENCOUNTER — Other Ambulatory Visit: Payer: Self-pay | Admitting: Physician Assistant

## 2020-09-11 ENCOUNTER — Telehealth: Payer: Self-pay

## 2020-09-11 MED ORDER — PREGABALIN 50 MG PO CAPS: 50.0000 mg | ORAL_CAPSULE | Freq: Three times a day (TID) | ORAL | 2 refills | Status: DC

## 2020-09-11 NOTE — Telephone Encounter (Signed)
Patient called in wanting to get medication sent in . Says whatever she used previously she cant use anymore.

## 2020-09-11 NOTE — Telephone Encounter (Signed)
Called and lm on vm to advise that rx for lyrica is at the front desk for pick up. Our electronic system is doen and not able to send rx to pharmacy but she can pick up original rx at the front desk. To call with questions.

## 2020-09-11 NOTE — Telephone Encounter (Signed)
Sending to you as you have another message on patient as well.

## 2020-09-11 NOTE — Telephone Encounter (Signed)
Michaela Rios from [facility] called she stated patient cant tolerate amitripytyline she is requesting Lyrica  CALL [phone]

## 2020-09-11 NOTE — Telephone Encounter (Signed)
Can you please see message below and advise if you would like to change as requested or if you would like to hold for Xu.

## 2020-09-11 NOTE — Telephone Encounter (Signed)
Will sign off on this message as one is already pending advisement from provider.

## 2020-09-14 DIAGNOSIS — R69 Illness, unspecified: Secondary | ICD-10-CM | POA: Diagnosis not present

## 2020-09-15 ENCOUNTER — Other Ambulatory Visit: Payer: Self-pay | Admitting: Internal Medicine

## 2020-09-24 ENCOUNTER — Other Ambulatory Visit: Payer: Self-pay | Admitting: Internal Medicine

## 2020-09-28 ENCOUNTER — Other Ambulatory Visit: Payer: Self-pay

## 2020-09-28 ENCOUNTER — Ambulatory Visit: Payer: Medicare HMO | Admitting: Pharmacist

## 2020-09-28 DIAGNOSIS — F3289 Other specified depressive episodes: Secondary | ICD-10-CM

## 2020-09-28 DIAGNOSIS — I10 Essential (primary) hypertension: Secondary | ICD-10-CM

## 2020-09-28 DIAGNOSIS — F419 Anxiety disorder, unspecified: Secondary | ICD-10-CM

## 2020-09-28 DIAGNOSIS — R202 Paresthesia of skin: Secondary | ICD-10-CM

## 2020-09-28 DIAGNOSIS — R200 Anesthesia of skin: Secondary | ICD-10-CM

## 2020-09-28 NOTE — Progress Notes (Signed)
Subjective:    Patient ID: Michaela Rios, female    DOB: [birth year], 69 y.o.   MRN: [id number]  HPI The patient is here for an acute visit.  ? Yeast infection, UTI - she has frequent urination, mild dysuria.  She also thinks she has a yesast infection.  She has external itch.  She tried otc monisat.    Medications and allergies reviewed with patient and updated if appropriate.  Patient Active Problem List   Diagnosis Date Noted  . Numbness and tingling in right hand 09/08/2020  . Rhabdomyolysis 08/18/2020  . Nausea & vomiting 08/18/2020  . Intractable nausea and vomiting 08/18/2020  . Elevated LFTs 08/17/2020  . Hypokalemia 08/17/2020  . Elevated troponin 08/17/2020  . Tinea corporis 06/30/2020  . Herpes zoster without complication 05/27/1600  . Urinary urgency 04/06/2020  . Left lumbar radiculopathy 06/04/2018  . CKD (chronic kidney disease) stage 3, GFR 30-59 ml/min (HCC) 06/04/2018  . Pain management contract signed 06/03/2018  . Encounter for pain management 03/05/2018  . Memory difficulties 03/05/2018  . Hormone replacement therapy (HRT) 12/04/2017  . Sialadenitis, right parotid 06/24/2017  . Insomnia 05/12/2017  . Prediabetes 11/08/2016  . Vitamin D deficiency 06/08/2009  . Depression 01/07/2009  . PEPTIC ULCER DISEASE, CHRONIC 01/07/2009  . OTHER OBSTRUCTION OF DUODENUM 01/07/2009  . HIATAL HERNIA 01/07/2009  . Irritable bowel syndrome 01/07/2009  . DEGENERATIVE JOINT DISEASE 01/07/2009  . ANEMIA, HX OF 01/07/2009  . PULMONARY NODULE 06/09/2008  . Anxiety 04/01/2008  . Essential hypertension 04/01/2008  . Allergic rhinitis 04/01/2008  . GERD 04/01/2008  . LOW BACK PAIN SYNDROME 04/01/2008  . Chronic migraine 04/01/2008    Current Outpatient Medications on File Prior to Visit  Medication Sig Dispense Refill  . acetaminophen (TYLENOL) 650 MG CR tablet Take 650-1,300 mg by mouth every 8 (eight) hours as needed for pain.    Marland Kitchen amLODipine (NORVASC) 5 MG  tablet Take 1 tablet (5 mg total) by mouth daily. 90 tablet 0  . buPROPion (WELLBUTRIN XL) 300 MG 24 hr tablet Take 1 tablet (300 mg total) by mouth daily. 90 tablet 1  . Cholecalciferol (VITAMIN D3 PO) Take 1 tablet by mouth daily.    . cyclobenzaprine (FLEXERIL) 10 MG tablet Take 1 tablet (10 mg total) by mouth 3 (three) times daily as needed for muscle spasms. (Patient taking differently: Take 10 mg by mouth at bedtime as needed (headache). ) 90 tablet 0  . EPINEPHrine (EPIPEN 2-PAK) 0.3 mg/0.3 mL IJ SOAJ injection Inject 0.3 mLs (0.3 mg total) into the muscle as needed for anaphylaxis. 1 each 0  . furosemide (LASIX) 20 MG tablet Take 1 tablet (20 mg total) by mouth daily. 90 tablet 2  . hydrALAZINE (APRESOLINE) 25 MG tablet Take 1 tablet (25 mg total) by mouth in the morning and at bedtime.    Marland Kitchen HYDROcodone-acetaminophen (NORCO) 7.5-325 MG tablet Take 1 tablet by mouth 2 (two) times daily as needed (migraine headache).    . LORazepam (ATIVAN) 1 MG tablet TAKE ONE TABLET BY MOUTH TWICE DAILY 60 tablet 0  . metoprolol succinate (TOPROL-XL) 100 MG 24 hr tablet Take 1 tablet (100 mg total) by mouth daily. Take with or immediately following a meal. 90 tablet 3  . omeprazole (PRILOSEC) 20 MG capsule Take 1 capsule (20 mg total) by mouth daily. (Patient taking differently: Take 20 mg by mouth daily as needed (acid reflux). ) 90 capsule 3  . ondansetron (ZOFRAN) 4 MG tablet Take 1  tablet (4 mg total) by mouth every 6 (six) hours as needed for nausea. 20 tablet 0  . pregabalin (LYRICA) 50 MG capsule Take 1 capsule (50 mg total) by mouth 3 (three) times daily. 30 capsule 2  . SUMAtriptan (IMITREX) 100 MG tablet Take 100 mg by mouth See admin instructions. Take one tablet (100 mg) by mouth at onset of migraine headache, may repeat in 2 hours if still needed    . tiZANidine (ZANAFLEX) 4 MG tablet Take 4 mg by mouth 2 (two) times daily as needed for muscle spasms.     Marland Kitchen topiramate (TOPAMAX) 50 MG tablet Take  75 mg by mouth daily.     Marland Kitchen venlafaxine XR (EFFEXOR-XR) 75 MG 24 hr capsule Take 225 mg by mouth daily.     Marland Kitchen zolpidem (AMBIEN) 10 MG tablet TAKE ONE TABLET BY MOUTH EVERYDAY AT BEDTIME AS NEEDED FOR SLEEP 30 tablet 1  . amitriptyline (ELAVIL) 25 MG tablet Take 1 tablet (25 mg total) by mouth at bedtime. (Patient not taking: Reported on 09/28/2020) 30 tablet 2   No current facility-administered medications on file prior to visit.    Past Medical History:  Diagnosis Date  . Anemia   . Anxiety   . Arthritis   . Cataract   . Chronic migraine    follows with neuro for same  . Clotting disorder (HCC)    clot in ovarian vein- hx  . Esophagitis   . GERD (gastroesophageal reflux disease)   . Hypertension   . IBS (irritable bowel syndrome)   . Low back pain syndrome   . MRSA (methicillin resistant Staphylococcus aureus) infection 04/17/12   left torso  . Peptic ulcer disease 2003, 12/2012  . Peripheral vascular disease ([place])   . Vitamin D deficiency     Past Surgical History:  Procedure Laterality Date  . ABDOMINAL HYSTERECTOMY    . LAPAROSCOPIC BILATERAL SALPINGO OOPHERECTOMY    . pud surgery w/duod sticture-plasty and vagotomy  2003   Dr. Marlou Starks  . TONSILLECTOMY AND ADENOIDECTOMY     as a child  . TUBAL LIGATION  1984    Social History   Socioeconomic History  . Marital status: Married    Spouse name: Levada Dy  . Number of children: 1  . Years of education: Not on file  . Highest education level: Not on file  Occupational History  . Occupation: retired  Tobacco Use  . Smoking status: Never Smoker  . Smokeless tobacco: Never Used  Vaping Use  . Vaping Use: Never used  Substance and Sexual Activity  . Alcohol use: Yes    Comment: rare  . Drug use: No  . Sexual activity: Yes  Other Topics Concern  . Not on file  Social History Narrative   Lives with spouse    Grown son in [place], 2 g kids   Social Determinants of Health   Financial Resource Strain: Medium Risk  .  Difficulty of Paying Living Expenses: Somewhat hard  Food Insecurity: No Food Insecurity  . Worried About Charity fundraiser in the Last Year: Never true  . Ran Out of Food in the Last Year: Never true  Transportation Needs: No Transportation Needs  . Lack of Transportation (Medical): No  . Lack of Transportation (Non-Medical): No  Physical Activity: Inactive  . Days of Exercise per Week: 0 days  . Minutes of Exercise per Session: 0 min  Stress: Stress Concern Present  . Feeling of Stress : Very much  Social Connections: Moderately Isolated  . Frequency of Communication with Friends and Family: More than three times a week  . Frequency of Social Gatherings with Friends and Family: Never  . Attends Religious Services: Never  . Active Member of Clubs or Organizations: No  . Attends Archivist Meetings: Never  . Marital Status: Married    Family History  Problem Relation Age of Onset  . Aneurysm Father   . Prostate cancer Brother   . Lung cancer Brother   . Colon cancer Neg Hx   . Esophageal cancer Neg Hx   . Rectal cancer Neg Hx   . Stomach cancer Neg Hx     Review of Systems  Constitutional: Negative for fever.  Gastrointestinal: Positive for nausea. Negative for abdominal pain.  Genitourinary: Positive for dysuria, frequency and urgency. Negative for hematuria, vaginal bleeding and vaginal discharge.       External genital itch       Objective:   Vitals:   09/29/20 1358  BP: (!) 146/84  Pulse: 85  Temp: 98.4 F (36.9 C)  SpO2: 95%   BP Readings from Last 3 Encounters:  09/29/20 (!) 146/84  08/19/20 (!) 156/100  07/29/20 138/84   Wt Readings from Last 3 Encounters:  09/29/20 136 lb 9.6 oz (62 kg)  08/19/20 137 lb 14.4 oz (62.6 kg)  07/29/20 142 lb (64.4 kg)   Body mass index is 24.2 kg/m.   Physical Exam Constitutional:      General: She is not in acute distress.    Appearance: Normal appearance. She is not ill-appearing.  HENT:     Head:  Normocephalic and atraumatic.  Abdominal:     Palpations: Abdomen is soft.     Tenderness: There is abdominal tenderness (suprapubic region). There is no right CVA tenderness, left CVA tenderness, guarding or rebound.  Skin:    General: Skin is warm and dry.  Neurological:     Mental Status: She is alert.            Assessment & Plan:    See Problem List for Assessment and Plan of chronic medical problems.    This visit occurred during the SARS-CoV-2 public health emergency.  Safety protocols were in place, including screening questions prior to the visit, additional usage of staff PPE, and extensive cleaning of exam room while observing appropriate contact time as indicated for disinfecting solutions.

## 2020-09-28 NOTE — Patient Instructions (Signed)
Visit Information  Phone number for Pharmacist: [phone]  Goals Addressed            This [place]       CARE PLAN ENTRY  Current Barriers:   Chronic Disease Management support, education, and care coordination needs related to Hypertension, Depression, and Anxiety, Neuropathy   Hypertension BP Readings from Last 3 Encounters:  08/19/20 (!) 156/100  07/29/20 138/84  07/29/20 138/84   Pharmacist Clinical Goal(s): o Over the next 90 days, patient will work with PharmD and providers to achieve BP goal <130/80  Current regimen:  o Amlodipine 5 mg daily  o Furosemide 20 mg daily  o Hydralazine 25 mg 2 times daily o Metoprolol succinate 100 mg daily   Interventions: o Discussed BP goals and importance of medication adherence  Patient self care activities - Over the next 90 days, patient will: o Check BP daily, document, and provide at future appointments o Ensure daily salt intake < 2300 mg/day  Depression/Anxiety  Pharmacist Clinical Goal(s) o Over the next 90 days, patient will work with PharmD and providers to optimize therapy  Current regimen:  o Lorazepam 1 mg twice daily as needed o Venlafaxine XR 75 mg - 3 capsules once daily o Bupropion XL 300 mg daily - 2 tablets daily  Interventions: o Discussed maximum effective dose for venlafaxine is 225 mg/day o Switched alprazolam to lorazepam for cost savings o Recommended to switch bupropion to 300 mg since patient has experienced improvement in symptoms on higher dose  Patient self care activities - Over the next 90 days, patient will: o Take medications as prescribed o Follow up with PCP as needed  Neuropathy  Pharmacist Clinical Goal(s) o Over the next 90 days, patient will work with PharmD and providers to optimize therapy  Current regimen:  o Pregabalin 50 mg 3 times daily (not started)  Interventions: o Discussed benefits of pregabalin for neuropathy; coordinated  with prescriber to deliver via [facility]  Patient self care activities - Over the next 90 days, patient will: o Take medication as directed o Contact prescriber with side effects or issues  Medication management  Pharmacist Clinical Goal(s): o Over the next 90 days, patient will work with PharmD and providers to achieve optimal medication adherence  Current pharmacy: [facility]  Interventions o Comprehensive medication review performed. o Utilize [facility] pharmacy for medication synchronization, packaging and delivery  Patient self care activities - Over the next 90 days, patient will: o Focus on medication adherence by pill pack o Take medications as prescribed o Report any questions or concerns to PharmD and/or provider(s)  Please see past updates related to this goal by clicking on the "Past Updates" button in the selected goal       Patient verbalizes understanding of instructions provided today.  Telephone follow up appointment with pharmacy team member scheduled for: 3 months  Charlene Brooke, PharmD, [facility] Clinical Pharmacist [facility] [phone]

## 2020-09-28 NOTE — Chronic Care Management (AMB) (Signed)
Chronic Care Management Pharmacy  Name: Michaela Rios  MRN: [id number] DOB: Jun 25, 1951  Chief Complaint/ HPI  Michaela Rios,  69 y.o. , female presents for their Follow-Up CCM visit with the clinical pharmacist via telephone due to COVID-19 Pandemic.  PCP : Binnie Rail, MD  Their chronic conditions include: HTN, allergic rhinitis, GERD, migraine, IBS, Chronic pain, CKD, anxiety, depression, insomnia, pre-diabetes  Born and raised in [place], met her husband in the 56s at [place] - she worked in Engineer, site. Son is in [place] with his family, 3 grandchildren age 84-10. Has been able to visit a few times.    Office Visits: 07/29/20 Dr [name] OV: chronic f/u. Shingles rash resolved, now with nerve pain. Increase gabapentin to 400 mg HS, 100-200 mg AM  06/30/20 Dr [name] OV: acute visit for rash on hand/arm - started 1 week ago. Rash on L breast - 1 month ago. Consistent with shingles, start Valtrex, ciclopirox BID  06/23/20 Dr [name] OV: pain management  04/06/20: Patient presented to Dr. [name] for pain management. Patient with urinary urgency, vaginal pain, increased frequency. UCx positive for K.Pneumonia. Patient started on Ciprofloxacin 250 mg x 10 days. Nasonex, potassium stopped.   01/06/20: Patient presented to Dr. [name] for pain management. BP in clinic 152/90. Home BP elevated, possibly due to migraines. Patient started on hydralazine 25 mg TID for HTN, omeprazole 20 mg for GERD.     Consult Visit: 09/08/20 Dr Erlinda Hong (ortho): eval for numbness/tingling in R hand. Rx'd amitriptyline, however pt cannot tolerate so rx'd pregabalin.  08/17/20 - 08/19/20 hospital admission: N/V x 3 days, admitted for dehydration, elevated LFTs, rhabdomyolysis. Outpatient f/u  01/23/20: Patient presented to Dr. Nicki Reaper (optometry) for presbyopia.    Allergies  Allergen Reactions  . Shellfish Allergy Nausea And Vomiting and Other (See Comments)    Tested "high" on allergy test  . Amoxicillin Hives  .  Cephalexin Hives  . Lisinopril Cough    .  Marland Kitchen Penicillins Hives    Medications: Outpatient Encounter Medications as of 09/28/2020  Medication Sig Note  . acetaminophen (TYLENOL) 650 MG CR tablet Take 650-1,300 mg by mouth every 8 (eight) hours as needed for pain.   Marland Kitchen amLODipine (NORVASC) 5 MG tablet Take 1 tablet (5 mg total) by mouth daily.   Marland Kitchen buPROPion (WELLBUTRIN XL) 300 MG 24 hr tablet Take 1 tablet (300 mg total) by mouth daily.   . Cholecalciferol (VITAMIN D3 PO) Take 1 tablet by mouth daily.   . cyclobenzaprine (FLEXERIL) 10 MG tablet Take 1 tablet (10 mg total) by mouth 3 (three) times daily as needed for muscle spasms. (Patient taking differently: Take 10 mg by mouth at bedtime as needed (headache). )   . EPINEPHrine (EPIPEN 2-PAK) 0.3 mg/0.3 mL IJ SOAJ injection Inject 0.3 mLs (0.3 mg total) into the muscle as needed for anaphylaxis.   . furosemide (LASIX) 20 MG tablet Take 1 tablet (20 mg total) by mouth daily.   . hydrALAZINE (APRESOLINE) 25 MG tablet Take 1 tablet (25 mg total) by mouth in the morning and at bedtime.   Marland Kitchen HYDROcodone-acetaminophen (NORCO) 7.5-325 MG tablet Take 1 tablet by mouth 2 (two) times daily as needed (migraine headache).   . LORazepam (ATIVAN) 1 MG tablet TAKE ONE TABLET BY MOUTH TWICE DAILY   . metoprolol succinate (TOPROL-XL) 100 MG 24 hr tablet Take 1 tablet (100 mg total) by mouth daily. Take with or immediately following a meal. 08/17/2020: Pt does not remember exact  date of last dose but states that she usually takes at 11am  . omeprazole (PRILOSEC) 20 MG capsule Take 1 capsule (20 mg total) by mouth daily. (Patient taking differently: Take 20 mg by mouth daily as needed (acid reflux). )   . ondansetron (ZOFRAN) 4 MG tablet Take 1 tablet (4 mg total) by mouth every 6 (six) hours as needed for nausea.   . pregabalin (LYRICA) 50 MG capsule Take 1 capsule (50 mg total) by mouth 3 (three) times daily.   . SUMAtriptan (IMITREX) 100 MG tablet Take 100 mg by  mouth See admin instructions. Take one tablet (100 mg) by mouth at onset of migraine headache, may repeat in 2 hours if still needed   . tiZANidine (ZANAFLEX) 4 MG tablet Take 4 mg by mouth 2 (two) times daily as needed for muscle spasms.    Marland Kitchen topiramate (TOPAMAX) 50 MG tablet Take 75 mg by mouth daily.    Marland Kitchen venlafaxine XR (EFFEXOR-XR) 75 MG 24 hr capsule Take 225 mg by mouth daily.    Marland Kitchen zolpidem (AMBIEN) 10 MG tablet TAKE ONE TABLET BY MOUTH EVERYDAY AT BEDTIME AS NEEDED FOR SLEEP   . amitriptyline (ELAVIL) 25 MG tablet Take 1 tablet (25 mg total) by mouth at bedtime. (Patient not taking: Reported on 09/28/2020)    No facility-administered encounter medications on file as of 09/28/2020.   Wt Readings from Last 3 Encounters:  08/19/20 137 lb 14.4 oz (62.6 kg)  07/29/20 142 lb (64.4 kg)  07/29/20 142 lb (64.4 kg)    Current Diagnosis/Assessment:   Goals Addressed            This Visit's Progress   . Pharmacy Care Plan       CARE PLAN ENTRY  Current Barriers:  . Chronic Disease Management support, education, and care coordination needs related to Hypertension, Depression, and Anxiety, Neuropathy   Hypertension BP Readings from Last 3 Encounters:  08/19/20 (!) 156/100  07/29/20 138/84  07/29/20 138/84 .  Pharmacist Clinical Goal(s): o Over the next 90 days, patient will work with PharmD and providers to achieve BP goal <130/80 . Current regimen:  o Amlodipine 5 mg daily  o Furosemide 20 mg daily  o Hydralazine 25 mg 2 times daily o Metoprolol succinate 100 mg daily  . Interventions: o Discussed BP goals and importance of medication adherence . Patient self care activities - Over the next 90 days, patient will: o Check BP daily, document, and provide at future appointments o Ensure daily salt intake < 2300 mg/day  Depression/Anxiety . Pharmacist Clinical Goal(s) o Over the next 90 days, patient will work with PharmD and providers to optimize therapy . Current regimen:   o Lorazepam 1 mg twice daily as needed o Venlafaxine XR 75 mg - 3 capsules once daily o Bupropion XL 300 mg daily - 2 tablets daily . Interventions: o Discussed maximum effective dose for venlafaxine is 225 mg/day o Switched alprazolam to lorazepam for cost savings o Recommended to switch bupropion to 300 mg since patient has experienced improvement in symptoms on higher dose . Patient self care activities - Over the next 90 days, patient will: o Take medications as prescribed o Follow up with PCP as needed  Neuropathy . Pharmacist Clinical Goal(s) o Over the next 90 days, patient will work with PharmD and providers to optimize therapy . Current regimen:  o Pregabalin 50 mg 3 times daily (not started) . Interventions: o Discussed benefits of pregabalin for neuropathy; coordinated with  prescriber to deliver via [facility] . Patient self care activities - Over the next 90 days, patient will: o Take medication as directed o Contact prescriber with side effects or issues  Medication management . Pharmacist Clinical Goal(s): o Over the next 90 days, patient will work with PharmD and providers to achieve optimal medication adherence . Current pharmacy: [facility] . Interventions o Comprehensive medication review performed. o Utilize [facility] pharmacy for medication synchronization, packaging and delivery . Patient self care activities - Over the next 90 days, patient will: o Focus on medication adherence by pill pack o Take medications as prescribed o Report any questions or concerns to PharmD and/or provider(s)  Please see past updates related to this goal by clicking on the "Past Updates" button in the selected goal        Hypertension   BP goal is:  <130/80  Office blood pressures are  BP Readings from Last 3 Encounters:  08/19/20 (!) 156/100  07/29/20 138/84  07/29/20 138/84   Lab Results  Component Value Date   CREATININE 0.72 08/19/2020   BUN 8 08/19/2020    GFR 36.33 (L) 04/06/2020   GFRNONAA >60 08/19/2020   GFRAA >60 08/19/2020   NA 137 08/19/2020   K 3.6 08/19/2020   CALCIUM 9.2 08/19/2020   CO2 19 (L) 08/19/2020   Patient checks BP at home 1-2x per week  Patient home BP readings are ranging: 131/82   Patient has failed these meds in the past: Candesartan, lisinopril (cough), felodipine, verapamil Patient is currently controlled on the following medications:   Amlodipine 5 mg daily -HS  Furosemide 20 mg daily   Hydralazine 25 mg BID   Metoprolol succinate 100 mg daily - AM  We discussed: pt has been taking medications as directed  Plan  Continue current medications and control with diet and exercise   Depression / Anxiety   Depression screen [facility] 2/9 07/29/2020 09/07/2018 05/09/2016  Decreased Interest 0 2 0  Down, Depressed, Hopeless 0 2 0  PHQ - 2 Score 0 4 0  Altered sleeping - 3 -  Tired, decreased energy - 2 -  Change in appetite - 0 -  Feeling bad or failure about yourself  - 1 -  Trouble concentrating - 0 -  Moving slowly or fidgety/restless - 0 -  Suicidal thoughts - 0 -  PHQ-9 Score - 10 -  Difficult doing work/chores - Somewhat difficult -  Some recent data might be hidden   Patient has failed these meds in past: duloxetine, sertraline Patient is currently uncontrolled on the following medications:   Lorazepam 1 mg BID prn  Venlafaxine XR 75 mg - 3 caps AM  Bupropion XL 300 mg daily   Zolpidem 10 mg HS prn  We discussed:  Pt reports doing "fair" with current regimen; recent illnesses have taken a toll on her overall mood; previously she improved with higher dose of bupropion  Plan:  Continue current medications  Shingles   Patient has failed these meds in past: n/a Patient is currently controlled on the following medications:  . Pregabalin 50 mg TID (not started)  We discussed:  Pt saw hand specialist for tingling/numbness and was initially prescribed amitriptyline, however she has not  tolerated this drug in the past so she requested an alternative. Pregabalin was prescribed however e-prescribing but malfunctioning so the Rx was printed - pt was not able to get to office to pick up hard copy in the last 2 weeks. Will coordinate with  prescriber and [facility] to deliver medication to the patient this week as she is still suffering from neuropathy.  Plan  Continue current medications  Coordinate with [facility] pharmacy to deliver pregabalin Recommend Shingrix vaccine after active infection has resolved  Medication Management   Pt uses [facility] for all medications 90-day pill packs Pt endorses 100% compliance  We discussed: Reviewed patient's [facility] medication and Epic medication profile assuring there are no discrepancies or gaps in therapy. Confirmed all fill dates appropriate and verified with patient that there is a sufficient quantity of all prescribed medications at home. Informed patient to call me any time if needing medications before scheduled deliveries.  Pt reports she very happy with [facility] delivery services.    Plan  Utilize [facility] pharmacy for medication synchronization, packaging and delivery    Follow up: 3 month phone visit  Charlene Brooke, PharmD, [facility] Clinical Pharmacist [facility] [phone]

## 2020-09-29 ENCOUNTER — Ambulatory Visit (INDEPENDENT_AMBULATORY_CARE_PROVIDER_SITE_OTHER): Payer: Medicare HMO | Admitting: Internal Medicine

## 2020-09-29 ENCOUNTER — Encounter: Payer: Self-pay | Admitting: Internal Medicine

## 2020-09-29 VITALS — BP 146/84 | HR 85 | Temp 98.4°F | Ht 63.0 in | Wt 136.6 lb

## 2020-09-29 DIAGNOSIS — B3731 Acute candidiasis of vulva and vagina: Secondary | ICD-10-CM

## 2020-09-29 DIAGNOSIS — B373 Candidiasis of vulva and vagina: Secondary | ICD-10-CM | POA: Diagnosis not present

## 2020-09-29 DIAGNOSIS — N3000 Acute cystitis without hematuria: Secondary | ICD-10-CM

## 2020-09-29 LAB — POC URINALSYSI DIPSTICK (AUTOMATED)
Bilirubin, UA: NEGATIVE
Blood, UA: NEGATIVE
Glucose, UA: NEGATIVE
Ketones, UA: NEGATIVE
Nitrite, UA: NEGATIVE
Protein, UA: NEGATIVE
Spec Grav, UA: 1.02 (ref 1.010–1.025)
Urobilinogen, UA: 0.2 E.U./dL
pH, UA: 6 (ref 5.0–8.0)

## 2020-09-29 MED ORDER — SULFAMETHOXAZOLE-TRIMETHOPRIM 800-160 MG PO TABS: 1.0000 | ORAL_TABLET | Freq: Two times a day (BID) | ORAL | 0 refills | Status: DC

## 2020-09-29 MED ORDER — FLUCONAZOLE 150 MG PO TABS: ORAL_TABLET | ORAL | 0 refills | Status: DC

## 2020-09-29 NOTE — Addendum Note (Signed)
Addended by: Trenda Moots on: 75/05/9727 03:36 PM   Modules accepted: Orders

## 2020-09-29 NOTE — Assessment & Plan Note (Addendum)
Acute External genital itch Diflucan 150 mg x 1 today and again after abx - may repeat x 1 after 72 hrs if still symptomatic

## 2020-09-29 NOTE — Patient Instructions (Addendum)
Take the Bactrim twice daily for 5 days.   Take one fluconazole today, then take one after finishing the antibiotic - you can take a second pill after  72 hrs if you still have symptoms.     Urinary Tract Infection, Adult  A urinary tract infection (UTI) is an infection of any part of the urinary tract. The urinary tract includes the kidneys, ureters, bladder, and urethra. These organs make, store, and get rid of urine in the body. Your health care provider may use other names to describe the infection. An upper UTI affects the ureters and kidneys (pyelonephritis). A lower UTI affects the bladder (cystitis) and urethra (urethritis). What are the causes? Most urinary tract infections are caused by bacteria in your genital area, around the entrance to your urinary tract (urethra). These bacteria grow and cause inflammation of your urinary tract. What increases the risk? You are more likely to develop this condition if:  You have a urinary catheter that stays in place (indwelling).  You are not able to control when you urinate or have a bowel movement (you have incontinence).  You are female and you: ? Use a spermicide or diaphragm for birth control. ? Have low estrogen levels. ? Are pregnant.  You have certain genes that increase your risk (genetics).  You are sexually active.  You take antibiotic medicines.  You have a condition that causes your flow of urine to slow down, such as: ? An enlarged prostate, if you are female. ? Blockage in your urethra (stricture). ? A kidney stone. ? A nerve condition that affects your bladder control (neurogenic bladder). ? Not getting enough to drink, or not urinating often.  You have certain medical conditions, such as: ? Diabetes. ? A weak disease-fighting system (immunesystem). ? Sickle cell disease. ? Gout. ? Spinal cord injury. What are the signs or symptoms? Symptoms of this condition include:  Needing to urinate right away  (urgently).  Frequent urination or passing small amounts of urine frequently.  Pain or burning with urination.  Blood in the urine.  Urine that smells bad or unusual.  Trouble urinating.  Cloudy urine.  Vaginal discharge, if you are female.  Pain in the abdomen or the lower back. You may also have:  Vomiting or a decreased appetite.  Confusion.  Irritability or tiredness.  A fever.  Diarrhea. The first symptom in older adults may be confusion. In some cases, they may not have any symptoms until the infection has worsened. How is this diagnosed? This condition is diagnosed based on your medical history and a physical exam. You may also have other tests, including:  Urine tests.  Blood tests.  Tests for sexually transmitted infections (STIs). If you have had more than one UTI, a cystoscopy or imaging studies may be done to determine the cause of the infections. How is this treated? Treatment for this condition includes:  Antibiotic medicine.  Over-the-counter medicines to treat discomfort.  Drinking enough water to stay hydrated. If you have frequent infections or have other conditions such as a kidney stone, you may need to see a health care provider who specializes in the urinary tract (urologist). In rare cases, urinary tract infections can cause sepsis. Sepsis is a life-threatening condition that occurs when the body responds to an infection. Sepsis is treated in the hospital with IV antibiotics, fluids, and other medicines. Follow these instructions at home:  Medicines  Take over-the-counter and prescription medicines only as told by your health care provider.  If you were prescribed an antibiotic medicine, take it as told by your health care provider. Do not stop using the antibiotic even if you start to feel better. General instructions  Make sure you: ? Empty your bladder often and completely. Do not hold urine for long periods of time. ? Empty your  bladder after sex. ? Wipe from front to back after a bowel movement if you are female. Use each tissue one time when you wipe.  Drink enough fluid to keep your urine pale yellow.  Keep all follow-up visits as told by your health care provider. This is important. Contact a health care provider if:  Your symptoms do not get better after 1-2 days.  Your symptoms go away and then return. Get help right away if you have:  Severe pain in your back or your lower abdomen.  A fever.  Nausea or vomiting. Summary  A urinary tract infection (UTI) is an infection of any part of the urinary tract, which includes the kidneys, ureters, bladder, and urethra.  Most urinary tract infections are caused by bacteria in your genital area, around the entrance to your urinary tract (urethra).  Treatment for this condition often includes antibiotic medicines.  If you were prescribed an antibiotic medicine, take it as told by your health care provider. Do not stop using the antibiotic even if you start to feel better.  Keep all follow-up visits as told by your health care provider. This is important. This information is not intended to replace advice given to you by your health care provider. Make sure you discuss any questions you have with your health care provider. Document Revised: 11/01/2018 Document Reviewed: 05/24/2018 Elsevier Patient Education  2020 [employer].

## 2020-09-29 NOTE — Addendum Note (Signed)
Addended by: Marcina Millard on: 09/29/2020 02:47 PM   Modules accepted: Orders

## 2020-09-29 NOTE — Assessment & Plan Note (Signed)
Acute Urine dip consistent with UTI Will send urine for culture Take the antibiotic as prescribed.  Bactrim DS 1 tab BID x 5 days Take tylenol if needed.   Increase your water intake.  Call if no improvement

## 2020-09-29 NOTE — Addendum Note (Signed)
Addended by: Marcina Millard on: 09/29/2020 02:46 PM   Modules accepted: Orders

## 2020-09-30 LAB — URINE CULTURE

## 2020-10-12 ENCOUNTER — Other Ambulatory Visit: Payer: Self-pay | Admitting: Internal Medicine

## 2020-10-13 DIAGNOSIS — R69 Illness, unspecified: Secondary | ICD-10-CM | POA: Diagnosis not present

## 2020-10-16 DIAGNOSIS — G8929 Other chronic pain: Secondary | ICD-10-CM | POA: Diagnosis not present

## 2020-10-16 DIAGNOSIS — Z809 Family history of malignant neoplasm, unspecified: Secondary | ICD-10-CM | POA: Diagnosis not present

## 2020-10-16 DIAGNOSIS — I10 Essential (primary) hypertension: Secondary | ICD-10-CM | POA: Diagnosis not present

## 2020-10-16 DIAGNOSIS — Z880 Allergy status to penicillin: Secondary | ICD-10-CM | POA: Diagnosis not present

## 2020-10-16 DIAGNOSIS — Z91013 Allergy to seafood: Secondary | ICD-10-CM | POA: Diagnosis not present

## 2020-10-16 DIAGNOSIS — Z9181 History of falling: Secondary | ICD-10-CM | POA: Diagnosis not present

## 2020-10-16 DIAGNOSIS — F419 Anxiety disorder, unspecified: Secondary | ICD-10-CM | POA: Diagnosis not present

## 2020-10-16 DIAGNOSIS — G4700 Insomnia, unspecified: Secondary | ICD-10-CM | POA: Diagnosis not present

## 2020-10-16 DIAGNOSIS — R69 Illness, unspecified: Secondary | ICD-10-CM | POA: Diagnosis not present

## 2020-10-16 DIAGNOSIS — G43909 Migraine, unspecified, not intractable, without status migrainosus: Secondary | ICD-10-CM | POA: Diagnosis not present

## 2020-10-26 ENCOUNTER — Telehealth: Payer: Self-pay | Admitting: Pharmacist

## 2020-10-26 NOTE — Progress Notes (Signed)
Chronic Care Management Pharmacy Assistant   Name: Michaela Rios  MRN: [id number] DOB: [birth year]  Reason for Encounter: Medication Review  PCP : Binnie Rail, MD  Allergies:   Allergies  Allergen Reactions  . Amitriptyline Hcl     Changed personality  . Shellfish Allergy Nausea And Vomiting and Other (See Comments)    Tested "high" on allergy test  . Amoxicillin Hives  . Cephalexin Hives  . Lisinopril Cough    .  Marland Kitchen Penicillins Hives    Medications: Outpatient Encounter Medications as of 10/26/2020  Medication Sig Note  . acetaminophen (TYLENOL) 650 MG CR tablet Take 650-1,300 mg by mouth every 8 (eight) hours as needed for pain.   Marland Kitchen amLODipine (NORVASC) 5 MG tablet Take 1 tablet (5 mg total) by mouth daily.   Marland Kitchen buPROPion (WELLBUTRIN XL) 300 MG 24 hr tablet Take 1 tablet (300 mg total) by mouth daily.   . Cholecalciferol (VITAMIN D3 PO) Take 1 tablet by mouth daily.   . cyclobenzaprine (FLEXERIL) 10 MG tablet Take 1 tablet (10 mg total) by mouth 3 (three) times daily as needed for muscle spasms. (Patient taking differently: Take 10 mg by mouth at bedtime as needed (headache). )   . EPINEPHrine (EPIPEN 2-PAK) 0.3 mg/0.3 mL IJ SOAJ injection Inject 0.3 mLs (0.3 mg total) into the muscle as needed for anaphylaxis.   . fluconazole (DIFLUCAN) 150 MG tablet Take 1 tab once then take 1 tab after completing the antibiotic, may repeat x 1 after 72 hrs   . furosemide (LASIX) 20 MG tablet Take 1 tablet (20 mg total) by mouth daily.   . hydrALAZINE (APRESOLINE) 25 MG tablet Take 1 tablet (25 mg total) by mouth in the morning and at bedtime.   Marland Kitchen HYDROcodone-acetaminophen (NORCO) 7.5-325 MG tablet Take 1 tablet by mouth 2 (two) times daily as needed (migraine headache).   . LORazepam (ATIVAN) 1 MG tablet TAKE ONE TABLET BY MOUTH TWICE DAILY   . metoprolol succinate (TOPROL-XL) 100 MG 24 hr tablet Take 1 tablet (100 mg total) by mouth daily. Take with or immediately following a meal.  08/17/2020: Pt does not remember exact date of last dose but states that she usually takes at 11am  . omeprazole (PRILOSEC) 20 MG capsule Take 1 capsule (20 mg total) by mouth daily. (Patient taking differently: Take 20 mg by mouth daily as needed (acid reflux). )   . ondansetron (ZOFRAN) 4 MG tablet Take 1 tablet (4 mg total) by mouth every 6 (six) hours as needed for nausea.   . pregabalin (LYRICA) 50 MG capsule Take 1 capsule (50 mg total) by mouth 3 (three) times daily.   Marland Kitchen sulfamethoxazole-trimethoprim (BACTRIM DS) 800-160 MG tablet Take 1 tablet by mouth 2 (two) times daily.   . SUMAtriptan (IMITREX) 100 MG tablet Take 100 mg by mouth See admin instructions. Take one tablet (100 mg) by mouth at onset of migraine headache, may repeat in 2 hours if still needed   . tiZANidine (ZANAFLEX) 4 MG tablet Take 4 mg by mouth 2 (two) times daily as needed for muscle spasms.    Marland Kitchen topiramate (TOPAMAX) 50 MG tablet Take 75 mg by mouth daily.    Marland Kitchen venlafaxine XR (EFFEXOR-XR) 75 MG 24 hr capsule Take 225 mg by mouth daily.    Marland Kitchen zolpidem (AMBIEN) 10 MG tablet TAKE ONE TABLET BY MOUTH EVERYDAY AT BEDTIME AS NEEDED FOR SLEEP    No facility-administered encounter medications on file as of  10/26/2020.    Current Diagnosis: Patient Active Problem List   Diagnosis Date Noted  . Vaginal yeast infection 09/29/2020  . Acute cystitis without hematuria 09/29/2020  . Numbness and tingling in right hand 09/08/2020  . Rhabdomyolysis 08/18/2020  . Nausea & vomiting 08/18/2020  . Intractable nausea and vomiting 08/18/2020  . Elevated LFTs 08/17/2020  . Hypokalemia 08/17/2020  . Elevated troponin 08/17/2020  . Tinea corporis 06/30/2020  . Herpes zoster without complication 23/30/0762  . Urinary urgency 04/06/2020  . Left lumbar radiculopathy 06/04/2018  . CKD (chronic kidney disease) stage 3, GFR 30-59 ml/min (HCC) 06/04/2018  . Pain management contract signed 06/03/2018  . Encounter for pain management  03/05/2018  . Memory difficulties 03/05/2018  . Hormone replacement therapy (HRT) 12/04/2017  . Sialadenitis, right parotid 06/24/2017  . Insomnia 05/12/2017  . Prediabetes 11/08/2016  . Vitamin D deficiency 06/08/2009  . Depression 01/07/2009  . PEPTIC ULCER DISEASE, CHRONIC 01/07/2009  . OTHER OBSTRUCTION OF DUODENUM 01/07/2009  . HIATAL HERNIA 01/07/2009  . Irritable bowel syndrome 01/07/2009  . DEGENERATIVE JOINT DISEASE 01/07/2009  . ANEMIA, HX OF 01/07/2009  . PULMONARY NODULE 06/09/2008  . Anxiety 04/01/2008  . Essential hypertension 04/01/2008  . Allergic rhinitis 04/01/2008  . GERD 04/01/2008  . LOW BACK PAIN SYNDROME 04/01/2008  . Chronic migraine 04/01/2008    Goals Addressed   None     Follow-Up:  Pharmacist Review   Received call from patient regarding medication management via [facility].  Patient requested an acute fill for Zolpidem 10mg  to be delivered: 10/26/2020 Pharmacy needs refills? No  Confirmed delivery date of 10/26/2020, advised patient that pharmacy will contact them the morning of delivery.  [name], [facility]  Practice Team Manager/ CPA (Clinical Pharmacist Assistant) [phone]

## 2020-10-28 ENCOUNTER — Telehealth: Payer: Self-pay | Admitting: Pharmacist

## 2020-10-28 NOTE — Progress Notes (Addendum)
Chronic Care Management Pharmacy Assistant   Name: Michaela Rios  MRN: [id number] DOB: [birth year]  Reason for Encounter: Medication Review  Patient Questions:  1.  Have you seen any other providers since your last visit? Yes, patient last seen Dr. Billey Gosling on 09/29/2020  2.  Any changes in your medicines or health? Yes, Dr. [name] added Fluconazole 150 mg and Sulfamenthoxazole Trimethoprim 800/160 mg on 09/29/2020    PCP : Binnie Rail, MD  Allergies:   Allergies  Allergen Reactions   Amitriptyline Hcl     Changed personality   Shellfish Allergy Nausea And Vomiting and Other (See Comments)    Tested "high" on allergy test   Amoxicillin Hives   Cephalexin Hives   Lisinopril Cough    .   Penicillins Hives    Medications: Outpatient Encounter Medications as of 10/28/2020  Medication Sig Note   acetaminophen (TYLENOL) 650 MG CR tablet Take 650-1,300 mg by mouth every 8 (eight) hours as needed for pain.    amLODipine (NORVASC) 5 MG tablet Take 1 tablet (5 mg total) by mouth daily.    buPROPion (WELLBUTRIN XL) 300 MG 24 hr tablet Take 1 tablet (300 mg total) by mouth daily.    Cholecalciferol (VITAMIN D3 PO) Take 1 tablet by mouth daily.    cyclobenzaprine (FLEXERIL) 10 MG tablet Take 1 tablet (10 mg total) by mouth 3 (three) times daily as needed for muscle spasms. (Patient taking differently: Take 10 mg by mouth at bedtime as needed (headache). )    EPINEPHrine (EPIPEN 2-PAK) 0.3 mg/0.3 mL IJ SOAJ injection Inject 0.3 mLs (0.3 mg total) into the muscle as needed for anaphylaxis.    fluconazole (DIFLUCAN) 150 MG tablet Take 1 tab once then take 1 tab after completing the antibiotic, may repeat x 1 after 72 hrs    furosemide (LASIX) 20 MG tablet Take 1 tablet (20 mg total) by mouth daily.    hydrALAZINE (APRESOLINE) 25 MG tablet Take 1 tablet (25 mg total) by mouth in the morning and at bedtime.    HYDROcodone-acetaminophen (NORCO) 7.5-325 MG tablet Take 1 tablet by mouth 2  (two) times daily as needed (migraine headache).    LORazepam (ATIVAN) 1 MG tablet TAKE ONE TABLET BY MOUTH TWICE DAILY    metoprolol succinate (TOPROL-XL) 100 MG 24 hr tablet Take 1 tablet (100 mg total) by mouth daily. Take with or immediately following a meal. 08/17/2020: Pt does not remember exact date of last dose but states that she usually takes at 11am   omeprazole (PRILOSEC) 20 MG capsule Take 1 capsule (20 mg total) by mouth daily. (Patient taking differently: Take 20 mg by mouth daily as needed (acid reflux). )    ondansetron (ZOFRAN) 4 MG tablet Take 1 tablet (4 mg total) by mouth every 6 (six) hours as needed for nausea.    pregabalin (LYRICA) 50 MG capsule Take 1 capsule (50 mg total) by mouth 3 (three) times daily.    sulfamethoxazole-trimethoprim (BACTRIM DS) 800-160 MG tablet Take 1 tablet by mouth 2 (two) times daily.    SUMAtriptan (IMITREX) 100 MG tablet Take 100 mg by mouth See admin instructions. Take one tablet (100 mg) by mouth at onset of migraine headache, may repeat in 2 hours if still needed    tiZANidine (ZANAFLEX) 4 MG tablet Take 4 mg by mouth 2 (two) times daily as needed for muscle spasms.     topiramate (TOPAMAX) 50 MG tablet Take 75 mg by mouth  daily.     venlafaxine XR (EFFEXOR-XR) 75 MG 24 hr capsule Take 225 mg by mouth daily.     zolpidem (AMBIEN) 10 MG tablet TAKE ONE TABLET BY MOUTH EVERYDAY AT BEDTIME AS NEEDED FOR SLEEP    No facility-administered encounter medications on file as of 10/28/2020.    Current Diagnosis: Patient Active Problem List   Diagnosis Date Noted   Vaginal yeast infection 09/29/2020   Acute cystitis without hematuria 09/29/2020   Numbness and tingling in right hand 09/08/2020   Rhabdomyolysis 08/18/2020   Nausea & vomiting 08/18/2020   Intractable nausea and vomiting 08/18/2020   Elevated LFTs 08/17/2020   Hypokalemia 08/17/2020   Elevated troponin 08/17/2020   Tinea corporis 06/30/2020   Herpes zoster without complication  35/36/1443   Urinary urgency 04/06/2020   Left lumbar radiculopathy 06/04/2018   CKD (chronic kidney disease) stage 3, GFR 30-59 ml/min (HCC) 06/04/2018   Pain management contract signed 06/03/2018   Encounter for pain management 03/05/2018   Memory difficulties 03/05/2018   Hormone replacement therapy (HRT) 12/04/2017   Sialadenitis, right parotid 06/24/2017   Insomnia 05/12/2017   Prediabetes 11/08/2016   Vitamin D deficiency 06/08/2009   Depression 01/07/2009   PEPTIC ULCER DISEASE, CHRONIC 01/07/2009   OTHER OBSTRUCTION OF DUODENUM 01/07/2009   HIATAL HERNIA 01/07/2009   Irritable bowel syndrome 01/07/2009   DEGENERATIVE JOINT DISEASE 01/07/2009   ANEMIA, HX OF 01/07/2009   PULMONARY NODULE 06/09/2008   Anxiety 04/01/2008   Essential hypertension 04/01/2008   Allergic rhinitis 04/01/2008   GERD 04/01/2008   LOW BACK PAIN SYNDROME 04/01/2008   Chronic migraine 04/01/2008    Goals Addressed   None     Follow-Up:  Coordination of Enhanced Pharmacy Services   Reviewed chart for medication changes ahead of medication coordination call.  No OVs, Consults, or hospital visits since last care coordination call/Pharmacist visit.  No medication changes indicated   BP Readings from Last 3 Encounters:  09/29/20 (!) 146/84  08/19/20 (!) 156/100  07/29/20 138/84    Lab Results  Component Value Date   HGBA1C 5.5 04/06/2020     Patient obtains medications through Adherence Packaging  90 Days   Last adherence delivery included:   Lorazepam 1 mg 1 tab by mouth twice daily Hydrocodone 5 mg 1 tab by mouth every 4-6 hours as needed for pain pregabalin 50 mg 1 cap by mouth three times daily topiramate 100 mg 1 1/2 tab by mouth every day at bedtime Furosemide 20 mg 1 tab by mouth every morning Metoprolol succinate ER 100 mg 1 tab every morning with or immediately after a meal Hydralazine 25 mg 1 tab every morning and every day at bedtime Amlodipine 5 mg 1 tab by mouth every  morning Venlafaxine ER 75 mg 3 caps by mouth every morning Bupropion Hcl 300 mg 1 tab by mouth every morning    Patient is due for next adherence delivery on: 11/03/20 Called patient and reviewed medications and coordinated delivery.  This delivery to include: Lorazepam 1 mg 1 tab by mouth twice daily (Vial) Hydrocodone 5 mg 1 tab by mouth every 4-6 hours as needed for pain (Vial) pregabalin 50 mg 1 cap by mouth three times daily - breakfast, lunch, bedtime topiramate 100 mg 1 1/2 tab by mouth every day at bedtime Furosemide 20 mg 1 tab by mouth every morning Metoprolol succinate ER 100 mg 1 tab every morning Hydralazine 25 mg 1 tab every morning and every day at bedtime Amlodipine 5  mg 1 tab by mouth every morning Venlafaxine ER 75 mg 3 caps by mouth every morning Bupropion Hcl 300 mg 1 tab by mouth every morning   Patient declined the following medications Zolpidem due to to enough on hand until next month.  Confirmed delivery date of 11/03/20, advised patient that pharmacy will contact them the morning of delivery.   Wendy Poet, Clinical Pharmacist Assistant [facility]

## 2020-11-02 ENCOUNTER — Other Ambulatory Visit: Payer: Self-pay | Admitting: Internal Medicine

## 2020-11-03 ENCOUNTER — Other Ambulatory Visit: Payer: Self-pay | Admitting: Internal Medicine

## 2020-11-03 MED ORDER — HYDROCODONE-ACETAMINOPHEN 7.5-325 MG PO TABS: 1.0000 | ORAL_TABLET | Freq: Two times a day (BID) | ORAL | 0 refills | Status: DC | PRN

## 2020-11-04 ENCOUNTER — Other Ambulatory Visit: Payer: Self-pay | Admitting: Internal Medicine

## 2020-11-04 MED ORDER — VENLAFAXINE HCL ER 75 MG PO CP24
225.0000 mg | ORAL_CAPSULE | Freq: Every day | ORAL | 0 refills | Status: DC
Start: 2020-11-04 — End: 2021-01-29

## 2020-11-17 ENCOUNTER — Telehealth: Payer: Self-pay | Admitting: Pharmacist

## 2020-11-17 NOTE — Progress Notes (Addendum)
Chronic Care Management Pharmacy Assistant   Name: Michaela Rios  MRN: [id number] DOB: 1951-10-12  Reason for Encounter: Medication Review  Patient Questions:  1.  Have you seen any other providers since your last visit? No  2.  Any changes in your medicines or health? No   PCP : Michaela Rail, MD  Allergies:   Allergies  Allergen Reactions   Amitriptyline Hcl     Changed personality   Shellfish Allergy Nausea And Vomiting and Other (See Comments)    Tested "high" on allergy test   Amoxicillin Hives   Cephalexin Hives   Lisinopril Cough    .   Penicillins Hives    Medications: Outpatient Encounter Medications as of 11/17/2020  Medication Sig Note   LORazepam (ATIVAN) 1 MG tablet TAKE ONE TABLET BY MOUTH TWICE DAILY    acetaminophen (TYLENOL) 650 MG CR tablet Take 650-1,300 mg by mouth every 8 (eight) hours as needed for pain.    amLODipine (NORVASC) 5 MG tablet Take 1 tablet (5 mg total) by mouth daily.    buPROPion (WELLBUTRIN XL) 300 MG 24 hr tablet Take 1 tablet (300 mg total) by mouth daily.    Cholecalciferol (VITAMIN D3 PO) Take 1 tablet by mouth daily.    cyclobenzaprine (FLEXERIL) 10 MG tablet Take 1 tablet (10 mg total) by mouth 3 (three) times daily as needed for muscle spasms. (Patient taking differently: Take 10 mg by mouth at bedtime as needed (headache). )    EPINEPHrine (EPIPEN 2-PAK) 0.3 mg/0.3 mL IJ SOAJ injection Inject 0.3 mLs (0.3 mg total) into the muscle as needed for anaphylaxis.    fluconazole (DIFLUCAN) 150 MG tablet Take 1 tab once then take 1 tab after completing the antibiotic, may repeat x 1 after 72 hrs    furosemide (LASIX) 20 MG tablet Take 1 tablet (20 mg total) by mouth daily.    hydrALAZINE (APRESOLINE) 25 MG tablet Take 1 tablet (25 mg total) by mouth in the morning and at bedtime.    HYDROcodone-acetaminophen (NORCO) 7.5-325 MG tablet Take 1 tablet by mouth 2 (two) times daily as needed (migraine headache).    metoprolol succinate  (TOPROL-XL) 100 MG 24 hr tablet Take 1 tablet (100 mg total) by mouth daily. Take with or immediately following a meal. 08/17/2020: Pt does not remember exact date of last dose but states that she usually takes at 11am   omeprazole (PRILOSEC) 20 MG capsule Take 1 capsule (20 mg total) by mouth daily. (Patient taking differently: Take 20 mg by mouth daily as needed (acid reflux). )    ondansetron (ZOFRAN) 4 MG tablet Take 1 tablet (4 mg total) by mouth every 6 (six) hours as needed for nausea.    pregabalin (LYRICA) 50 MG capsule Take 1 capsule (50 mg total) by mouth 3 (three) times daily.    SUMAtriptan (IMITREX) 100 MG tablet Take 100 mg by mouth See admin instructions. Take one tablet (100 mg) by mouth at onset of migraine headache, may repeat in 2 hours if still needed    tiZANidine (ZANAFLEX) 4 MG tablet Take 4 mg by mouth 2 (two) times daily as needed for muscle spasms.     topiramate (TOPAMAX) 50 MG tablet Take 75 mg by mouth daily.     venlafaxine XR (EFFEXOR-XR) 75 MG 24 hr capsule Take 3 capsules (225 mg total) by mouth daily.    zolpidem (AMBIEN) 10 MG tablet TAKE ONE TABLET BY MOUTH EVERYDAY AT BEDTIME AS NEEDED  FOR SLEEP    No facility-administered encounter medications on file as of 11/17/2020.    Current Diagnosis: Patient Active Problem List   Diagnosis Date Noted   Vaginal yeast infection 09/29/2020   Acute cystitis without hematuria 09/29/2020   Numbness and tingling in right hand 09/08/2020   Rhabdomyolysis 08/18/2020   Nausea & vomiting 08/18/2020   Intractable nausea and vomiting 08/18/2020   Elevated LFTs 08/17/2020   Hypokalemia 08/17/2020   Elevated troponin 08/17/2020   Tinea corporis 06/30/2020   Herpes zoster without complication 123456   Urinary urgency 04/06/2020   Left lumbar radiculopathy 06/04/2018   CKD (chronic kidney disease) stage 3, GFR 30-59 ml/min (HCC) 06/04/2018   Pain management contract signed 06/03/2018   Encounter for pain management  03/05/2018   Memory difficulties 03/05/2018   Hormone replacement therapy (HRT) 12/04/2017   Sialadenitis, right parotid 06/24/2017   Insomnia 05/12/2017   Prediabetes 11/08/2016   Vitamin D deficiency 06/08/2009   Depression 01/07/2009   PEPTIC ULCER DISEASE, CHRONIC 01/07/2009   OTHER OBSTRUCTION OF DUODENUM 01/07/2009   HIATAL HERNIA 01/07/2009   Irritable bowel syndrome 01/07/2009   DEGENERATIVE JOINT DISEASE 01/07/2009   ANEMIA, HX OF 01/07/2009   PULMONARY NODULE 06/09/2008   Anxiety 04/01/2008   Essential hypertension 04/01/2008   Allergic rhinitis 04/01/2008   GERD 04/01/2008   LOW BACK PAIN SYNDROME 04/01/2008   Chronic migraine 04/01/2008    Goals Addressed   None     Follow-Up:  Coordination of Enhanced Pharmacy Services    Received call from patient regarding medication management via [facility].  Patient requested an acute fill for Zolpidem Tartrate 10mg   to be delivered: 11/19/2020 Pharmacy needs refills? No  Confirmed delivery date of 11/19/2020, advised patient that pharmacy will contact them the morning of delivery.  [name], [facility]  Practice Team Manager/ CPA (Clinical Pharmacist Assistant) [phone]

## 2020-11-19 ENCOUNTER — Other Ambulatory Visit: Payer: Self-pay | Admitting: Internal Medicine

## 2020-11-29 NOTE — Progress Notes (Deleted)
Subjective:    Patient ID: Michaela Rios, female    DOB: February 14, 1951, 70 y.o.   MRN: [id number]  HPI The patient is here for follow up of their chronic medical problems, including chronic pain management for migraines, htn, post herpetic neuralgia, insomnia, depression, anxiety   The patient is here for follow up for chronic pain management.  Indication for chronic opioid: migraines Medication and dose: vicodin 7.5-325 mg - 1 tab prn  # pills per month: 15  Last UDS date: 04/06/20  Pain contract signed (Y/N): 04/06/20 Date narcotic database last reviewed 11/30/19  Pain assessment:  Pain intensity: 10/10 at times Amount of pain relief with medication: good Use of pain medications: appropriately taking medications Side effects:  no Sleep:  *** Mood:  *** Functional activities: continues to be active:  yes  Last took prescribed pain medication:  *** Any concerning Alcohol, Street Drug use: no     Medications and allergies reviewed with patient and updated if appropriate.  Patient Active Problem List   Diagnosis Date Noted  . Vaginal yeast infection 09/29/2020  . Acute cystitis without hematuria 09/29/2020  . Numbness and tingling in right hand 09/08/2020  . Rhabdomyolysis 08/18/2020  . Nausea & vomiting 08/18/2020  . Intractable nausea and vomiting 08/18/2020  . Elevated LFTs 08/17/2020  . Hypokalemia 08/17/2020  . Elevated troponin 08/17/2020  . Tinea corporis 06/30/2020  . Herpes zoster without complication [id number]  . Urinary urgency 04/06/2020  . Left lumbar radiculopathy 06/04/2018  . CKD (chronic kidney disease) stage 3, GFR 30-59 ml/min (HCC) 06/04/2018  . Pain management contract signed 06/03/2018  . Encounter for pain management 03/05/2018  . Memory difficulties 03/05/2018  . Hormone replacement therapy (HRT) 12/04/2017  . Sialadenitis, right parotid 06/24/2017  . Insomnia 05/12/2017  . Prediabetes 11/08/2016  . Vitamin D deficiency 06/08/2009  .  Depression 01/07/2009  . PEPTIC ULCER DISEASE, CHRONIC 01/07/2009  . OTHER OBSTRUCTION OF DUODENUM 01/07/2009  . HIATAL HERNIA 01/07/2009  . Irritable bowel syndrome 01/07/2009  . DEGENERATIVE JOINT DISEASE 01/07/2009  . ANEMIA, HX OF 01/07/2009  . PULMONARY NODULE 06/09/2008  . Anxiety 04/01/2008  . Essential hypertension 04/01/2008  . Allergic rhinitis 04/01/2008  . GERD 04/01/2008  . LOW BACK PAIN SYNDROME 04/01/2008  . Chronic migraine 04/01/2008    Current Outpatient Medications on File Prior to Visit  Medication Sig Dispense Refill  . LORazepam (ATIVAN) 1 MG tablet TAKE ONE TABLET BY MOUTH TWICE DAILY 180 tablet 0  . acetaminophen (TYLENOL) 650 MG CR tablet Take 650-1,300 mg by mouth every 8 (eight) hours as needed for pain.    Marland Kitchen amLODipine (NORVASC) 5 MG tablet TAKE ONE TABLET BY MOUTH EVERY MORNING 90 tablet 1  . buPROPion (WELLBUTRIN XL) 300 MG 24 hr tablet Take 1 tablet (300 mg total) by mouth daily. 90 tablet 1  . Cholecalciferol (VITAMIN D3 PO) Take 1 tablet by mouth daily.    . cyclobenzaprine (FLEXERIL) 10 MG tablet Take 1 tablet (10 mg total) by mouth 3 (three) times daily as needed for muscle spasms. (Patient taking differently: Take 10 mg by mouth at bedtime as needed (headache). ) 90 tablet 0  . EPINEPHrine (EPIPEN 2-PAK) 0.3 mg/0.3 mL IJ SOAJ injection Inject 0.3 mLs (0.3 mg total) into the muscle as needed for anaphylaxis. 1 each 0  . fluconazole (DIFLUCAN) 150 MG tablet Take 1 tab once then take 1 tab after completing the antibiotic, may repeat x 1 after 72 hrs 3 tablet 0  .  furosemide (LASIX) 20 MG tablet Take 1 tablet (20 mg total) by mouth daily. 90 tablet 2  . hydrALAZINE (APRESOLINE) 25 MG tablet Take 1 tablet (25 mg total) by mouth in the morning and at bedtime.    Marland Kitchen HYDROcodone-acetaminophen (NORCO) 7.5-325 MG tablet Take 1 tablet by mouth 2 (two) times daily as needed (migraine headache). 15 tablet 0  . metoprolol succinate (TOPROL-XL) 100 MG 24 hr tablet  Take 1 tablet (100 mg total) by mouth daily. Take with or immediately following a meal. 90 tablet 3  . omeprazole (PRILOSEC) 20 MG capsule Take 1 capsule (20 mg total) by mouth daily. (Patient taking differently: Take 20 mg by mouth daily as needed (acid reflux). ) 90 capsule 3  . ondansetron (ZOFRAN) 4 MG tablet Take 1 tablet (4 mg total) by mouth every 6 (six) hours as needed for nausea. 20 tablet 0  . pregabalin (LYRICA) 50 MG capsule Take 1 capsule (50 mg total) by mouth 3 (three) times daily. 30 capsule 2  . SUMAtriptan (IMITREX) 100 MG tablet Take 100 mg by mouth See admin instructions. Take one tablet (100 mg) by mouth at onset of migraine headache, may repeat in 2 hours if still needed    . tiZANidine (ZANAFLEX) 4 MG tablet Take 4 mg by mouth 2 (two) times daily as needed for muscle spasms.     Marland Kitchen topiramate (TOPAMAX) 50 MG tablet Take 75 mg by mouth daily.     Marland Kitchen venlafaxine XR (EFFEXOR-XR) 75 MG 24 hr capsule Take 3 capsules (225 mg total) by mouth daily. 270 capsule 0  . zolpidem (AMBIEN) 10 MG tablet TAKE ONE TABLET BY MOUTH EVERYDAY AT BEDTIME AS NEEDED FOR SLEEP 30 tablet 1   No current facility-administered medications on file prior to visit.    Past Medical History:  Diagnosis Date  . Anemia   . Anxiety   . Arthritis   . Cataract   . Chronic migraine    follows with neuro for same  . Clotting disorder (HCC)    clot in ovarian vein- hx  . Esophagitis   . GERD (gastroesophageal reflux disease)   . Hypertension   . IBS (irritable bowel syndrome)   . Low back pain syndrome   . MRSA (methicillin resistant Staphylococcus aureus) infection 04/17/12   left torso  . Peptic ulcer disease 2003, 12/2012  . Peripheral vascular disease (HCC)   . Vitamin D deficiency     Past Surgical History:  Procedure Laterality Date  . ABDOMINAL HYSTERECTOMY    . LAPAROSCOPIC BILATERAL SALPINGO OOPHERECTOMY    . pud surgery w/duod sticture-plasty and vagotomy  2003   Dr. Carolynne Edouard  .  TONSILLECTOMY AND ADENOIDECTOMY     as a child  . TUBAL LIGATION  1984    Social History   Socioeconomic History  . Marital status: Married    Spouse name: Nash Dimmer  . Number of children: 1  . Years of education: Not on file  . Highest education level: Not on file  Occupational History  . Occupation: retired  Tobacco Use  . Smoking status: Never Smoker  . Smokeless tobacco: Never Used  Vaping Use  . Vaping Use: Never used  Substance and Sexual Activity  . Alcohol use: Yes    Comment: rare  . Drug use: No  . Sexual activity: Yes  Other Topics Concern  . Not on file  Social History Narrative   Lives with spouse    Grown son in [place], 2 g  kids   Social Determinants of Health   Financial Resource Strain: Medium Risk  . Difficulty of Paying Living Expenses: Somewhat hard  Food Insecurity: No Food Insecurity  . Worried About Charity fundraiser in the Last Year: Never true  . Ran Out of Food in the Last Year: Never true  Transportation Needs: No Transportation Needs  . Lack of Transportation (Medical): No  . Lack of Transportation (Non-Medical): No  Physical Activity: Inactive  . Days of Exercise per Week: 0 days  . Minutes of Exercise per Session: 0 min  Stress: Stress Concern Present  . Feeling of Stress : Very much  Social Connections: Moderately Isolated  . Frequency of Communication with Friends and Family: More than three times a week  . Frequency of Social Gatherings with Friends and Family: Never  . Attends Religious Services: Never  . Active Member of Clubs or Organizations: No  . Attends Archivist Meetings: Never  . Marital Status: Married    Family History  Problem Relation Age of Onset  . Aneurysm Father   . Prostate cancer Brother   . Lung cancer Brother   . Colon cancer Neg Hx   . Esophageal cancer Neg Hx   . Rectal cancer Neg Hx   . Stomach cancer Neg Hx     Review of Systems     Objective:  There were no vitals filed for this  visit. BP Readings from Last 3 Encounters:  09/29/20 (!) 146/84  08/19/20 (!) 156/100  07/29/20 138/84   Wt Readings from Last 3 Encounters:  09/29/20 136 lb 9.6 oz (62 kg)  08/19/20 137 lb 14.4 oz (62.6 kg)  07/29/20 142 lb (64.4 kg)   There is no height or weight on file to calculate BMI.   Physical Exam    Constitutional: Appears well-developed and well-nourished. No distress.  HENT:  Head: Normocephalic and atraumatic.  Neck: Neck supple. No tracheal deviation present. No thyromegaly present.  No cervical lymphadenopathy Cardiovascular: Normal rate, regular rhythm and normal heart sounds.   No murmur heard. No carotid bruit .  No edema Pulmonary/Chest: Effort normal and breath sounds normal. No respiratory distress. No has no wheezes. No rales.  Skin: Skin is warm and dry. Not diaphoretic.  Psychiatric: Normal mood and affect. Behavior is normal.      Assessment & Plan:    See Problem List for Assessment and Plan of chronic medical problems.    This visit occurred during the SARS-CoV-2 public health emergency.  Safety protocols were in place, including screening questions prior to the visit, additional usage of staff PPE, and extensive cleaning of exam room while observing appropriate contact time as indicated for disinfecting solutions.

## 2020-11-30 ENCOUNTER — Ambulatory Visit: Payer: Medicare HMO | Admitting: Internal Medicine

## 2020-11-30 DIAGNOSIS — G4709 Other insomnia: Secondary | ICD-10-CM

## 2020-11-30 DIAGNOSIS — F419 Anxiety disorder, unspecified: Secondary | ICD-10-CM

## 2020-11-30 DIAGNOSIS — R52 Pain, unspecified: Secondary | ICD-10-CM

## 2020-11-30 DIAGNOSIS — I10 Essential (primary) hypertension: Secondary | ICD-10-CM

## 2020-11-30 DIAGNOSIS — F3289 Other specified depressive episodes: Secondary | ICD-10-CM

## 2020-11-30 DIAGNOSIS — B0229 Other postherpetic nervous system involvement: Secondary | ICD-10-CM

## 2020-11-30 DIAGNOSIS — G43909 Migraine, unspecified, not intractable, without status migrainosus: Secondary | ICD-10-CM

## 2020-12-03 NOTE — Patient Instructions (Addendum)
  Blood work was ordered.       Medications changes include :   none      Please followup in 4 months  

## 2020-12-03 NOTE — Progress Notes (Signed)
Subjective:    Patient ID: Michaela Rios, female    DOB: 04-21-1951, 70 y.o.   MRN: [id number]  HPI The patient is here for follow up of their chronic medical problems, including chronic pain management for migraines, htn, post herpetic neuralgia, insomnia, depression, anxiety   The patient is here for follow up for chronic pain management.  Indication for chronic opioid: migraines Medication and dose: vicodin 7.5-325 mg - 1 tab prn  # pills per month: 15  Last UDS date: 04/06/20  Pain contract signed (Y/N): 04/06/20 Date narcotic database last reviewed  12/04/20  There were doing good until after thanksgiving and then have been bad since then.   Pain assessment:  Pain intensity: 10/10 at times Amount of pain relief with medication: good Use of pain medications: appropriately taking medications Side effects:  no Sleep:  Ok w/ ambien Mood:  stable Functional activities: continues to be active:  yes  Last took prescribed pain medication:  Takes prn only Any concerning Alcohol, Street Drug use: no   Having increase urgency in urination.  Denies dysuria.  She has had issues with incontinence.  She does wear a pad.  She denies changes in medications, drinks or activity  Medications and allergies reviewed with patient and updated if appropriate.  Patient Active Problem List   Diagnosis Date Noted  . Numbness and tingling in right hand 09/08/2020  . Rhabdomyolysis 08/18/2020  . Nausea & vomiting 08/18/2020  . Intractable nausea and vomiting 08/18/2020  . Elevated LFTs 08/17/2020  . Hypokalemia 08/17/2020  . Elevated troponin 08/17/2020  . Post herpetic neuralgia 06/30/2020  . Urinary urgency 04/06/2020  . Left lumbar radiculopathy 06/04/2018  . CKD (chronic kidney disease) stage 3, GFR 30-59 ml/min (HCC) 06/04/2018  . Pain management contract signed 06/03/2018  . Encounter for pain management 03/05/2018  . Memory difficulties 03/05/2018  . Sialadenitis, right parotid  06/24/2017  . Insomnia 05/12/2017  . Prediabetes 11/08/2016  . Vitamin D deficiency 06/08/2009  . Depression 01/07/2009  . PEPTIC ULCER DISEASE, CHRONIC 01/07/2009  . OTHER OBSTRUCTION OF DUODENUM 01/07/2009  . HIATAL HERNIA 01/07/2009  . Irritable bowel syndrome 01/07/2009  . DEGENERATIVE JOINT DISEASE 01/07/2009  . ANEMIA, HX OF 01/07/2009  . PULMONARY NODULE 06/09/2008  . Anxiety 04/01/2008  . Essential hypertension 04/01/2008  . Allergic rhinitis 04/01/2008  . GERD 04/01/2008  . LOW BACK PAIN SYNDROME 04/01/2008  . Migraines 04/01/2008    Current Outpatient Medications on File Prior to Visit  Medication Sig Dispense Refill  . acetaminophen (TYLENOL) 650 MG CR tablet Take 650-1,300 mg by mouth every 8 (eight) hours as needed for pain.    Marland Kitchen amLODipine (NORVASC) 5 MG tablet TAKE ONE TABLET BY MOUTH EVERY MORNING 90 tablet 1  . buPROPion (WELLBUTRIN XL) 300 MG 24 hr tablet Take 1 tablet (300 mg total) by mouth daily. 90 tablet 1  . Cholecalciferol (VITAMIN D3 PO) Take 1 tablet by mouth daily.    . cyclobenzaprine (FLEXERIL) 10 MG tablet Take 1 tablet (10 mg total) by mouth 3 (three) times daily as needed for muscle spasms. (Patient taking differently: Take 10 mg by mouth at bedtime as needed (headache).) 90 tablet 0  . EPINEPHrine (EPIPEN 2-PAK) 0.3 mg/0.3 mL IJ SOAJ injection Inject 0.3 mLs (0.3 mg total) into the muscle as needed for anaphylaxis. 1 each 0  . furosemide (LASIX) 20 MG tablet Take 1 tablet (20 mg total) by mouth daily. 90 tablet 2  . hydrALAZINE (APRESOLINE) 25  MG tablet Take 1 tablet (25 mg total) by mouth in the morning and at bedtime.    Marland Kitchen HYDROcodone-acetaminophen (NORCO) 7.5-325 MG tablet Take 1 tablet by mouth 2 (two) times daily as needed (migraine headache). 15 tablet 0  . LORazepam (ATIVAN) 1 MG tablet TAKE ONE TABLET BY MOUTH TWICE DAILY 180 tablet 0  . metoprolol succinate (TOPROL-XL) 100 MG 24 hr tablet Take 1 tablet (100 mg total) by mouth daily. Take with  or immediately following a meal. 90 tablet 3  . omeprazole (PRILOSEC) 20 MG capsule Take 1 capsule (20 mg total) by mouth daily. (Patient taking differently: Take 20 mg by mouth daily as needed (acid reflux).) 90 capsule 3  . ondansetron (ZOFRAN) 4 MG tablet Take 1 tablet (4 mg total) by mouth every 6 (six) hours as needed for nausea. 20 tablet 0  . SUMAtriptan (IMITREX) 100 MG tablet Take 100 mg by mouth See admin instructions. Take one tablet (100 mg) by mouth at onset of migraine headache, may repeat in 2 hours if still needed    . tiZANidine (ZANAFLEX) 4 MG tablet Take 4 mg by mouth 2 (two) times daily as needed for muscle spasms.     Marland Kitchen topiramate (TOPAMAX) 50 MG tablet Take 75 mg by mouth daily.     Marland Kitchen venlafaxine XR (EFFEXOR-XR) 75 MG 24 hr capsule Take 3 capsules (225 mg total) by mouth daily. 270 capsule 0  . zolpidem (AMBIEN) 10 MG tablet TAKE ONE TABLET BY MOUTH EVERYDAY AT BEDTIME AS NEEDED FOR SLEEP 30 tablet 1  . topiramate (TOPAMAX) 100 MG tablet Take 150 mg by mouth at bedtime.     No current facility-administered medications on file prior to visit.    Past Medical History:  Diagnosis Date  . Anemia   . Anxiety   . Arthritis   . Cataract   . Chronic migraine    follows with neuro for same  . Clotting disorder (HCC)    clot in ovarian vein- hx  . Esophagitis   . GERD (gastroesophageal reflux disease)   . Hypertension   . IBS (irritable bowel syndrome)   . Low back pain syndrome   . MRSA (methicillin resistant Staphylococcus aureus) infection 04/17/12   left torso  . Peptic ulcer disease 2003, 12/2012  . Peripheral vascular disease ([place])   . Vitamin D deficiency     Past Surgical History:  Procedure Laterality Date  . ABDOMINAL HYSTERECTOMY    . LAPAROSCOPIC BILATERAL SALPINGO OOPHERECTOMY    . pud surgery w/duod sticture-plasty and vagotomy  2003   Dr. Marlou Starks  . TONSILLECTOMY AND ADENOIDECTOMY     as a child  . TUBAL LIGATION  1984    Social History    Socioeconomic History  . Marital status: Married    Spouse name: Levada Dy  . Number of children: 1  . Years of education: Not on file  . Highest education level: Not on file  Occupational History  . Occupation: retired  Tobacco Use  . Smoking status: Never Smoker  . Smokeless tobacco: Never Used  Vaping Use  . Vaping Use: Never used  Substance and Sexual Activity  . Alcohol use: Yes    Comment: rare  . Drug use: No  . Sexual activity: Yes  Other Topics Concern  . Not on file  Social History Narrative   Lives with spouse    Grown son in [place], 2 g kids   Social Determinants of Health   Financial Resource Strain:  Medium Risk  . Difficulty of Paying Living Expenses: Somewhat hard  Food Insecurity: No Food Insecurity  . Worried About Charity fundraiser in the Last Year: Never true  . Ran Out of Food in the Last Year: Never true  Transportation Needs: No Transportation Needs  . Lack of Transportation (Medical): No  . Lack of Transportation (Non-Medical): No  Physical Activity: Inactive  . Days of Exercise per Week: 0 days  . Minutes of Exercise per Session: 0 min  Stress: Stress Concern Present  . Feeling of Stress : Very much  Social Connections: Moderately Isolated  . Frequency of Communication with Friends and Family: More than three times a week  . Frequency of Social Gatherings with Friends and Family: Never  . Attends Religious Services: Never  . Active Member of Clubs or Organizations: No  . Attends Archivist Meetings: Never  . Marital Status: Married    Family History  Problem Relation Age of Onset  . Aneurysm Father   . Prostate cancer Brother   . Lung cancer Brother   . Colon cancer Neg Hx   . Esophageal cancer Neg Hx   . Rectal cancer Neg Hx   . Stomach cancer Neg Hx     Review of Systems  Constitutional: Negative for fever.  Gastrointestinal: Positive for abdominal pain (a little ).  Genitourinary: Positive for frequency and urgency.  Negative for dysuria and hematuria.  Neurological: Positive for headaches.       Objective:   Vitals:   12/04/20 1502  BP: 140/86  Pulse: 70  Temp: 98.1 F (36.7 C)  SpO2: 98%   BP Readings from Last 3 Encounters:  12/04/20 140/86  09/29/20 (!) 146/84  08/19/20 (!) 156/100   Wt Readings from Last 3 Encounters:  12/04/20 132 lb (59.9 kg)  09/29/20 136 lb 9.6 oz (62 kg)  08/19/20 137 lb 14.4 oz (62.6 kg)   Body mass index is 23.38 kg/m.   Physical Exam    Constitutional: Appears well-developed and well-nourished. No distress.  Skin: Skin is warm and dry. Not diaphoretic.  Psychiatric: Normal mood and affect. Behavior is normal.      Assessment & Plan:    See Problem List for Assessment and Plan of chronic medical problems.    This visit occurred during the SARS-CoV-2 public health emergency.  Safety protocols were in place, including screening questions prior to the visit, additional usage of staff PPE, and extensive cleaning of exam room while observing appropriate contact time as indicated for disinfecting solutions.

## 2020-12-04 ENCOUNTER — Other Ambulatory Visit: Payer: Self-pay

## 2020-12-04 ENCOUNTER — Encounter: Payer: Self-pay | Admitting: Internal Medicine

## 2020-12-04 ENCOUNTER — Ambulatory Visit (INDEPENDENT_AMBULATORY_CARE_PROVIDER_SITE_OTHER): Payer: Medicare HMO | Admitting: Internal Medicine

## 2020-12-04 VITALS — BP 140/86 | HR 70 | Temp 98.1°F | Ht 63.0 in | Wt 132.0 lb

## 2020-12-04 DIAGNOSIS — F3289 Other specified depressive episodes: Secondary | ICD-10-CM

## 2020-12-04 DIAGNOSIS — R3915 Urgency of urination: Secondary | ICD-10-CM

## 2020-12-04 DIAGNOSIS — F419 Anxiety disorder, unspecified: Secondary | ICD-10-CM

## 2020-12-04 DIAGNOSIS — G4709 Other insomnia: Secondary | ICD-10-CM

## 2020-12-04 DIAGNOSIS — B0229 Other postherpetic nervous system involvement: Secondary | ICD-10-CM

## 2020-12-04 DIAGNOSIS — R52 Pain, unspecified: Secondary | ICD-10-CM | POA: Diagnosis not present

## 2020-12-04 DIAGNOSIS — R69 Illness, unspecified: Secondary | ICD-10-CM | POA: Diagnosis not present

## 2020-12-04 DIAGNOSIS — G43909 Migraine, unspecified, not intractable, without status migrainosus: Secondary | ICD-10-CM

## 2020-12-04 DIAGNOSIS — I10 Essential (primary) hypertension: Secondary | ICD-10-CM | POA: Diagnosis not present

## 2020-12-04 LAB — COMPREHENSIVE METABOLIC PANEL
ALT: 36 U/L — ABNORMAL HIGH (ref 0–35)
AST: 32 U/L (ref 0–37)
Albumin: 4.4 g/dL (ref 3.5–5.2)
Alkaline Phosphatase: 94 U/L (ref 39–117)
BUN: 20 mg/dL (ref 6–23)
CO2: 26 mEq/L (ref 19–32)
Calcium: 9.5 mg/dL (ref 8.4–10.5)
Chloride: 107 mEq/L (ref 96–112)
Creatinine, Ser: 1.23 mg/dL — ABNORMAL HIGH (ref 0.40–1.20)
GFR: 44.63 mL/min — ABNORMAL LOW (ref >60.00)
Glucose, Bld: 88 mg/dL (ref 70–99)
Potassium: 3.4 mEq/L — ABNORMAL LOW (ref 3.5–5.1)
Sodium: 140 mEq/L (ref 135–145)
Total Bilirubin: 0.3 mg/dL (ref 0.2–1.2)
Total Protein: 7.9 g/dL (ref 6.0–8.3)

## 2020-12-04 NOTE — Assessment & Plan Note (Signed)
Acute She states urinary urgency with some frequency, but no dysuria, blood in the urine or fevers We checked her urine at her last visit and there is no infection, but we will recheck today to make sure Discussed treatment option if no infection including continuing to wear pads, behavioral changes, pelvic physical therapy, medications, seeing a specialist and had a last resort surgery

## 2020-12-04 NOTE — Assessment & Plan Note (Signed)
Chronic Stable, fairly controlled Does have some mild depression with the holidays and the fact that her son and his family moved to [place], but she knows its best for them Continue venlafaxine 225 mg daily

## 2020-12-04 NOTE — Assessment & Plan Note (Signed)
Chronic Following with neurology I am managing her hydrocodone which she takes as a last resort for her migraines Hydrocodone-acetaminophen is effective and she tries to take it on a rare occasion Continue hydrocodone-acetaminophen 7.5-325 mg twice daily as needed for severe migraines Follow-up in 4 months

## 2020-12-04 NOTE — Assessment & Plan Note (Signed)
Chronic Overall fairly controlled Continue Ativan 1 mg twice daily Continue venlafaxine 225 mg daily

## 2020-12-04 NOTE — Assessment & Plan Note (Signed)
Chronic Controlled, stable Continue Ambien 10 mg nightly-she is not able to sleep without it

## 2020-12-04 NOTE — Assessment & Plan Note (Signed)
Chronic Blood pressure is slightly better-acceptable today Continue amlodipine 5 mg daily, hydralazine 25 mg twice daily, metoprolol XL 100 mg daily We will follow-up in 4 months and adjust medication if blood pressure is elevated CMP

## 2020-12-04 NOTE — Assessment & Plan Note (Signed)
Chronic Pain management for migraines-severe nature Following with neurology and they prescribed most of her medication With all of the other medications do not work she does take Vicodin, which I am prescribing She takes medication appropriately and it is very effective Continue hydrocodone-acetaminophen 7.5-325 mg twice daily as needed for migraines when all of the medications or not effective [place] controlled substance database checked.  She does not need the medication at this time Follow-up in 4 months

## 2020-12-06 LAB — URINE CULTURE

## 2020-12-06 MED ORDER — NITROFURANTOIN MONOHYD MACRO 100 MG PO CAPS: 100.0000 mg | ORAL_CAPSULE | Freq: Two times a day (BID) | ORAL | 0 refills | Status: DC

## 2020-12-06 NOTE — Addendum Note (Signed)
Addended by: Binnie Rail on: 12/06/2020 03:46 PM   Modules accepted: Orders

## 2020-12-20 ENCOUNTER — Other Ambulatory Visit: Payer: Self-pay | Admitting: Internal Medicine

## 2020-12-20 MED ORDER — HYDROCODONE-ACETAMINOPHEN 7.5-325 MG PO TABS: 1.0000 | ORAL_TABLET | Freq: Two times a day (BID) | ORAL | 0 refills | Status: DC | PRN

## 2020-12-20 MED ORDER — CYCLOBENZAPRINE HCL 10 MG PO TABS: 10.0000 mg | ORAL_TABLET | Freq: Three times a day (TID) | ORAL | 0 refills | Status: DC | PRN

## 2020-12-28 ENCOUNTER — Encounter: Payer: Self-pay | Admitting: Internal Medicine

## 2020-12-28 MED ORDER — ZOLPIDEM TARTRATE 10 MG PO TABS: ORAL_TABLET | ORAL | 1 refills | Status: DC

## 2020-12-29 ENCOUNTER — Telehealth: Payer: Medicare HMO

## 2021-01-25 ENCOUNTER — Telehealth: Payer: Self-pay | Admitting: Pharmacist

## 2021-01-25 NOTE — Progress Notes (Signed)
Chronic Care Management Pharmacy Assistant   Name: Michaela Rios  MRN: [id number] DOB: [birth year]  Reason for Encounter: Chart Review   PCP : Binnie Rail, MD  Allergies:   Allergies  Allergen Reactions  . Amitriptyline Hcl     Changed personality  . Shellfish Allergy Nausea And Vomiting and Other (See Comments)    Tested "high" on allergy test  . Amoxicillin Hives  . Cephalexin Hives  . Lisinopril Cough    .  Marland Kitchen Penicillins Hives    Medications: Outpatient Encounter Medications as of 01/25/2021  Medication Sig Note  . acetaminophen (TYLENOL) 650 MG CR tablet Take 650-1,300 mg by mouth every 8 (eight) hours as needed for pain.   Marland Kitchen amLODipine (NORVASC) 5 MG tablet TAKE ONE TABLET BY MOUTH EVERY MORNING   . buPROPion (WELLBUTRIN XL) 300 MG 24 hr tablet Take 1 tablet (300 mg total) by mouth daily.   . Cholecalciferol (VITAMIN D3 PO) Take 1 tablet by mouth daily.   . cyclobenzaprine (FLEXERIL) 10 MG tablet Take 1 tablet (10 mg total) by mouth 3 (three) times daily as needed for muscle spasms.   Marland Kitchen EPINEPHrine (EPIPEN 2-PAK) 0.3 mg/0.3 mL IJ SOAJ injection Inject 0.3 mLs (0.3 mg total) into the muscle as needed for anaphylaxis.   . furosemide (LASIX) 20 MG tablet Take 1 tablet (20 mg total) by mouth daily.   . hydrALAZINE (APRESOLINE) 25 MG tablet Take 1 tablet (25 mg total) by mouth in the morning and at bedtime.   Marland Kitchen HYDROcodone-acetaminophen (NORCO) 7.5-325 MG tablet Take 1 tablet by mouth 2 (two) times daily as needed (migraine headache).   . LORazepam (ATIVAN) 1 MG tablet TAKE ONE TABLET BY MOUTH TWICE DAILY   . metoprolol succinate (TOPROL-XL) 100 MG 24 hr tablet Take 1 tablet (100 mg total) by mouth daily. Take with or immediately following a meal. 08/17/2020: Pt does not remember exact date of last dose but states that she usually takes at 11am  . nitrofurantoin, macrocrystal-monohydrate, (MACROBID) 100 MG capsule Take 1 capsule (100 mg total) by mouth 2 (two) times  daily.   Marland Kitchen omeprazole (PRILOSEC) 20 MG capsule Take 1 capsule (20 mg total) by mouth daily. (Patient taking differently: Take 20 mg by mouth daily as needed (acid reflux).)   . ondansetron (ZOFRAN) 4 MG tablet Take 1 tablet (4 mg total) by mouth every 6 (six) hours as needed for nausea.   . SUMAtriptan (IMITREX) 100 MG tablet Take 100 mg by mouth See admin instructions. Take one tablet (100 mg) by mouth at onset of migraine headache, may repeat in 2 hours if still needed   . tiZANidine (ZANAFLEX) 4 MG tablet Take 4 mg by mouth 2 (two) times daily as needed for muscle spasms.    Marland Kitchen topiramate (TOPAMAX) 100 MG tablet Take 150 mg by mouth at bedtime.   . topiramate (TOPAMAX) 50 MG tablet Take 75 mg by mouth daily.    Marland Kitchen venlafaxine XR (EFFEXOR-XR) 75 MG 24 hr capsule Take 3 capsules (225 mg total) by mouth daily.   Marland Kitchen zolpidem (AMBIEN) 10 MG tablet TAKE ONE TABLET BY MOUTH EVERYDAY AT BEDTIME AS NEEDED FOR SLEEP    No facility-administered encounter medications on file as of 01/25/2021.    Current Diagnosis: Patient Active Problem List   Diagnosis Date Noted  . Numbness and tingling in right hand 09/08/2020  . Rhabdomyolysis 08/18/2020  . Nausea & vomiting 08/18/2020  . Intractable nausea and vomiting 08/18/2020  .  Elevated LFTs 08/17/2020  . Hypokalemia 08/17/2020  . Elevated troponin 08/17/2020  . Post herpetic neuralgia 06/30/2020  . Urinary urgency 04/06/2020  . Left lumbar radiculopathy 06/04/2018  . CKD (chronic kidney disease) stage 3, GFR 30-59 ml/min (HCC) 06/04/2018  . Pain management contract signed 06/03/2018  . Encounter for pain management 03/05/2018  . Memory difficulties 03/05/2018  . Sialadenitis, right parotid 06/24/2017  . Insomnia 05/12/2017  . Prediabetes 11/08/2016  . Vitamin D deficiency 06/08/2009  . Depression 01/07/2009  . PEPTIC ULCER DISEASE, CHRONIC 01/07/2009  . OTHER OBSTRUCTION OF DUODENUM 01/07/2009  . HIATAL HERNIA 01/07/2009  . Irritable bowel  syndrome 01/07/2009  . DEGENERATIVE JOINT DISEASE 01/07/2009  . ANEMIA, HX OF 01/07/2009  . PULMONARY NODULE 06/09/2008  . Anxiety 04/01/2008  . Essential hypertension 04/01/2008  . Allergic rhinitis 04/01/2008  . GERD 04/01/2008  . LOW BACK PAIN SYNDROME 04/01/2008  . Migraines 04/01/2008    Goals Addressed   None     Follow-Up: Pharmacist Review   Reviewed chart for medication changes and adherence.  The patient last seen Dr. [name] on 12/04/20, since last care coordination call with clinical pharmacist, medications were changed  No gaps in adherence identified. Patient has follow up scheduled with pharmacy team. No further action required.    Wendy Poet, [place] [facility] [phone]

## 2021-01-26 ENCOUNTER — Telehealth: Payer: Self-pay | Admitting: Pharmacist

## 2021-01-26 NOTE — Progress Notes (Signed)
Chronic Care Management Pharmacy Assistant   Name: Michaela Rios  MRN: [id number] DOB: [birth year]  Reason for Encounter: Medication Review   PCP : Binnie Rail, MD  Allergies:   Allergies  Allergen Reactions  . Amitriptyline Hcl     Changed personality  . Shellfish Allergy Nausea And Vomiting and Other (See Comments)    Tested "high" on allergy test  . Amoxicillin Hives  . Cephalexin Hives  . Lisinopril Cough    .  Marland Kitchen Penicillins Hives    Medications: Outpatient Encounter Medications as of 01/26/2021  Medication Sig Note  . acetaminophen (TYLENOL) 650 MG CR tablet Take 650-1,300 mg by mouth every 8 (eight) hours as needed for pain.   Marland Kitchen amLODipine (NORVASC) 5 MG tablet TAKE ONE TABLET BY MOUTH EVERY MORNING   . buPROPion (WELLBUTRIN XL) 300 MG 24 hr tablet Take 1 tablet (300 mg total) by mouth daily.   . Cholecalciferol (VITAMIN D3 PO) Take 1 tablet by mouth daily.   . cyclobenzaprine (FLEXERIL) 10 MG tablet Take 1 tablet (10 mg total) by mouth 3 (three) times daily as needed for muscle spasms.   Marland Kitchen EPINEPHrine (EPIPEN 2-PAK) 0.3 mg/0.3 mL IJ SOAJ injection Inject 0.3 mLs (0.3 mg total) into the muscle as needed for anaphylaxis.   . furosemide (LASIX) 20 MG tablet Take 1 tablet (20 mg total) by mouth daily.   . hydrALAZINE (APRESOLINE) 25 MG tablet Take 1 tablet (25 mg total) by mouth in the morning and at bedtime.   Marland Kitchen HYDROcodone-acetaminophen (NORCO) 7.5-325 MG tablet Take 1 tablet by mouth 2 (two) times daily as needed (migraine headache).   . LORazepam (ATIVAN) 1 MG tablet TAKE ONE TABLET BY MOUTH TWICE DAILY   . metoprolol succinate (TOPROL-XL) 100 MG 24 hr tablet Take 1 tablet (100 mg total) by mouth daily. Take with or immediately following a meal. 08/17/2020: Pt does not remember exact date of last dose but states that she usually takes at 11am  . nitrofurantoin, macrocrystal-monohydrate, (MACROBID) 100 MG capsule Take 1 capsule (100 mg total) by mouth 2 (two) times  daily.   Marland Kitchen omeprazole (PRILOSEC) 20 MG capsule Take 1 capsule (20 mg total) by mouth daily. (Patient taking differently: Take 20 mg by mouth daily as needed (acid reflux).)   . ondansetron (ZOFRAN) 4 MG tablet Take 1 tablet (4 mg total) by mouth every 6 (six) hours as needed for nausea.   . SUMAtriptan (IMITREX) 100 MG tablet Take 100 mg by mouth See admin instructions. Take one tablet (100 mg) by mouth at onset of migraine headache, may repeat in 2 hours if still needed   . tiZANidine (ZANAFLEX) 4 MG tablet Take 4 mg by mouth 2 (two) times daily as needed for muscle spasms.    Marland Kitchen topiramate (TOPAMAX) 100 MG tablet Take 150 mg by mouth at bedtime.   . topiramate (TOPAMAX) 50 MG tablet Take 75 mg by mouth daily.    Marland Kitchen venlafaxine XR (EFFEXOR-XR) 75 MG 24 hr capsule Take 3 capsules (225 mg total) by mouth daily.   Marland Kitchen zolpidem (AMBIEN) 10 MG tablet TAKE ONE TABLET BY MOUTH EVERYDAY AT BEDTIME AS NEEDED FOR SLEEP    No facility-administered encounter medications on file as of 01/26/2021.    Current Diagnosis: Patient Active Problem List   Diagnosis Date Noted  . Numbness and tingling in right hand 09/08/2020  . Rhabdomyolysis 08/18/2020  . Nausea & vomiting 08/18/2020  . Intractable nausea and vomiting 08/18/2020  .  Elevated LFTs 08/17/2020  . Hypokalemia 08/17/2020  . Elevated troponin 08/17/2020  . Post herpetic neuralgia 06/30/2020  . Urinary urgency 04/06/2020  . Left lumbar radiculopathy 06/04/2018  . CKD (chronic kidney disease) stage 3, GFR 30-59 ml/min (HCC) 06/04/2018  . Pain management contract signed 06/03/2018  . Encounter for pain management 03/05/2018  . Memory difficulties 03/05/2018  . Sialadenitis, right parotid 06/24/2017  . Insomnia 05/12/2017  . Prediabetes 11/08/2016  . Vitamin D deficiency 06/08/2009  . Depression 01/07/2009  . PEPTIC ULCER DISEASE, CHRONIC 01/07/2009  . OTHER OBSTRUCTION OF DUODENUM 01/07/2009  . HIATAL HERNIA 01/07/2009  . Irritable bowel syndrome  01/07/2009  . DEGENERATIVE JOINT DISEASE 01/07/2009  . ANEMIA, HX OF 01/07/2009  . PULMONARY NODULE 06/09/2008  . Anxiety 04/01/2008  . Essential hypertension 04/01/2008  . Allergic rhinitis 04/01/2008  . GERD 04/01/2008  . LOW BACK PAIN SYNDROME 04/01/2008  . Migraines 04/01/2008    Goals Addressed   None     Follow-Up:  Coordination of Enhanced Pharmacy Services   Received call from patient regarding medication management via [facility].  Patient requested an acute fill for Zolpidem and Imitrex to be delivered: 01/28/21 Pharmacy needs refills? No  Confirmed delivery date of 01/28/21, advised patient that pharmacy will contact them the morning of delivery.  Wendy Poet, [place] [phone]   Time spent:10

## 2021-01-28 ENCOUNTER — Telehealth: Payer: Self-pay | Admitting: Pharmacist

## 2021-01-28 NOTE — Progress Notes (Signed)
Chronic Care Management Pharmacy Assistant   Name: Michaela Rios  MRN: [id number] DOB: 02-Mar-1951  Reason for Encounter: Medication Review  PCP : Binnie Rail, MD  Allergies:   Allergies  Allergen Reactions  . Amitriptyline Hcl     Changed personality  . Shellfish Allergy Nausea And Vomiting and Other (See Comments)    Tested "high" on allergy test  . Amoxicillin Hives  . Cephalexin Hives  . Lisinopril Cough    .  Marland Kitchen Penicillins Hives    Medications: Outpatient Encounter Medications as of 01/28/2021  Medication Sig Note  . acetaminophen (TYLENOL) 650 MG CR tablet Take 650-1,300 mg by mouth every 8 (eight) hours as needed for pain.   Marland Kitchen amLODipine (NORVASC) 5 MG tablet TAKE ONE TABLET BY MOUTH EVERY MORNING   . buPROPion (WELLBUTRIN XL) 300 MG 24 hr tablet Take 1 tablet (300 mg total) by mouth daily.   . Cholecalciferol (VITAMIN D3 PO) Take 1 tablet by mouth daily.   . cyclobenzaprine (FLEXERIL) 10 MG tablet Take 1 tablet (10 mg total) by mouth 3 (three) times daily as needed for muscle spasms.   Marland Kitchen EPINEPHrine (EPIPEN 2-PAK) 0.3 mg/0.3 mL IJ SOAJ injection Inject 0.3 mLs (0.3 mg total) into the muscle as needed for anaphylaxis.   . furosemide (LASIX) 20 MG tablet Take 1 tablet (20 mg total) by mouth daily.   . hydrALAZINE (APRESOLINE) 25 MG tablet Take 1 tablet (25 mg total) by mouth in the morning and at bedtime.   Marland Kitchen HYDROcodone-acetaminophen (NORCO) 7.5-325 MG tablet Take 1 tablet by mouth 2 (two) times daily as needed (migraine headache).   . LORazepam (ATIVAN) 1 MG tablet TAKE ONE TABLET BY MOUTH TWICE DAILY   . metoprolol succinate (TOPROL-XL) 100 MG 24 hr tablet Take 1 tablet (100 mg total) by mouth daily. Take with or immediately following a meal. 08/17/2020: Pt does not remember exact date of last dose but states that she usually takes at 11am  . nitrofurantoin, macrocrystal-monohydrate, (MACROBID) 100 MG capsule Take 1 capsule (100 mg total) by mouth 2 (two) times  daily.   Marland Kitchen omeprazole (PRILOSEC) 20 MG capsule Take 1 capsule (20 mg total) by mouth daily. (Patient taking differently: Take 20 mg by mouth daily as needed (acid reflux).)   . ondansetron (ZOFRAN) 4 MG tablet Take 1 tablet (4 mg total) by mouth every 6 (six) hours as needed for nausea.   . SUMAtriptan (IMITREX) 100 MG tablet Take 100 mg by mouth See admin instructions. Take one tablet (100 mg) by mouth at onset of migraine headache, may repeat in 2 hours if still needed   . tiZANidine (ZANAFLEX) 4 MG tablet Take 4 mg by mouth 2 (two) times daily as needed for muscle spasms.    Marland Kitchen topiramate (TOPAMAX) 100 MG tablet Take 150 mg by mouth at bedtime.   . topiramate (TOPAMAX) 50 MG tablet Take 75 mg by mouth daily.    Marland Kitchen venlafaxine XR (EFFEXOR-XR) 75 MG 24 hr capsule Take 3 capsules (225 mg total) by mouth daily.   Marland Kitchen zolpidem (AMBIEN) 10 MG tablet TAKE ONE TABLET BY MOUTH EVERYDAY AT BEDTIME AS NEEDED FOR SLEEP    No facility-administered encounter medications on file as of 01/28/2021.    Current Diagnosis: Patient Active Problem List   Diagnosis Date Noted  . Numbness and tingling in right hand 09/08/2020  . Rhabdomyolysis 08/18/2020  . Nausea & vomiting 08/18/2020  . Intractable nausea and vomiting 08/18/2020  . Elevated  LFTs 08/17/2020  . Hypokalemia 08/17/2020  . Elevated troponin 08/17/2020  . Post herpetic neuralgia 06/30/2020  . Urinary urgency 04/06/2020  . Left lumbar radiculopathy 06/04/2018  . CKD (chronic kidney disease) stage 3, GFR 30-59 ml/min (HCC) 06/04/2018  . Pain management contract signed 06/03/2018  . Encounter for pain management 03/05/2018  . Memory difficulties 03/05/2018  . Sialadenitis, right parotid 06/24/2017  . Insomnia 05/12/2017  . Prediabetes 11/08/2016  . Vitamin D deficiency 06/08/2009  . Depression 01/07/2009  . PEPTIC ULCER DISEASE, CHRONIC 01/07/2009  . OTHER OBSTRUCTION OF DUODENUM 01/07/2009  . HIATAL HERNIA 01/07/2009  . Irritable bowel syndrome  01/07/2009  . DEGENERATIVE JOINT DISEASE 01/07/2009  . ANEMIA, HX OF 01/07/2009  . PULMONARY NODULE 06/09/2008  . Anxiety 04/01/2008  . Essential hypertension 04/01/2008  . Allergic rhinitis 04/01/2008  . GERD 04/01/2008  . LOW BACK PAIN SYNDROME 04/01/2008  . Migraines 04/01/2008    Reviewed chart for medication changes ahead of medication coordination call.  No OVs, Consults, or hospital visits since last care coordination call/Pharmacist visit. (If appropriate, list visit date, provider name)  No medication changes indicated OR if recent visit, treatment plan here.  BP Readings from Last 3 Encounters:  12/04/20 140/86  09/29/20 (!) 146/84  08/19/20 (!) 156/100    Lab Results  Component Value Date   HGBA1C 5.5 04/06/2020     Patient obtains medications through Adherence Packaging  90 Days   Last adherence delivery included:  Zolpidem 10mg  Take one tab by mouth everyday at bedtime as needed for sleep  Patient is due for next adherence delivery on: 01-29-21. Called patient and reviewed medications and coordinated delivery.  This delivery to include: Amlodipine 5mg       Take one tab by mouth every evening Furosemide 20mg  Tab  Take one tab by mouth every morning Metoprolol Succinate Er  Take one tab by mouth every morning Hydralazine 25mg  Take one tab by mouth every morning and take one tab by mouth every day at bedtime Bupropion Hcl XL 300mg  Take one tab by mouth every morning Lorazepam 1mg  Tab Take one tab twice daily as needed. Venlafaxine Er 75mg  Take three capsules by mouth every morning Topiramate 100mg  Tab Take 1 and  tablets by mouth every day at bedtime   Patient needs refills for : Hydralazine 25mg  Take one tab by mouth every morning and take one tab by mouth every day at       Bedtime - CPP to request Bupropion Hcl XL 300mg  Take one tab by mouth every morning - Cpp to request Venlafaxine Er 75mg  Take three capsules by mouth every morning- [facility] to  request Topiramate 100mg  Tab Take 1 and  tablets by mouth every day at bedtime-CPP to request   Confirmed delivery date of 01-29-21, advised patient that pharmacy will contact them the morning of delivery.  Georgiana Shore ,[place] Pharmacist Assistant [phone]  Follow-Up:  Pharmacist Review    Time spent : 36 minutes

## 2021-01-28 NOTE — Addendum Note (Signed)
Addended by: Charlton Haws on: 01/28/2021 05:20 PM   Modules accepted: Orders

## 2021-01-28 NOTE — Telephone Encounter (Signed)
Patient is requesting refills to [facility] for the following medications:  Hydralazine 25 mg Bupropion XL 300 mg Venlafaxine ER 75 mg Topiramate 100 mg

## 2021-01-29 ENCOUNTER — Other Ambulatory Visit: Payer: Self-pay

## 2021-01-29 ENCOUNTER — Other Ambulatory Visit: Payer: Self-pay | Admitting: Internal Medicine

## 2021-01-30 NOTE — Telephone Encounter (Signed)
topamax is prescribed by her neurologist

## 2021-02-01 ENCOUNTER — Telehealth: Payer: Self-pay | Admitting: Internal Medicine

## 2021-02-01 NOTE — Telephone Encounter (Signed)
Message left for patient today 

## 2021-02-01 NOTE — Telephone Encounter (Signed)
Received message from patient regarding Topamax.  Patient reports this was initial prescribed by [facility], she does not wish to return there and has not been back in over a year. She is requesting PCP take over prescribing Topamax.  She reports she is currently taking topiramate 100 mg tablets - 1.5 tablets (150 mg) daily at bedtime.

## 2021-02-01 NOTE — Telephone Encounter (Signed)
Please call patient, she has questions about medication. Unable to give additional details

## 2021-02-02 ENCOUNTER — Telehealth: Payer: Medicare HMO

## 2021-02-02 MED ORDER — TOPIRAMATE 100 MG PO TABS
150.0000 mg | ORAL_TABLET | Freq: Every day | ORAL | 1 refills | Status: DC
Start: 2021-02-02 — End: 2021-06-30

## 2021-02-02 NOTE — Telephone Encounter (Signed)
This was sent in earlier today

## 2021-02-14 ENCOUNTER — Encounter: Payer: Self-pay | Admitting: Internal Medicine

## 2021-02-15 MED ORDER — SUMATRIPTAN SUCCINATE 100 MG PO TABS: ORAL_TABLET | ORAL | 5 refills | Status: DC

## 2021-02-21 ENCOUNTER — Other Ambulatory Visit: Payer: Self-pay | Admitting: Internal Medicine

## 2021-03-01 ENCOUNTER — Telehealth: Payer: Self-pay | Admitting: Pharmacist

## 2021-03-01 NOTE — Progress Notes (Addendum)
    Chronic Care Management Pharmacy Assistant   Name: Michaela Rios  MRN: [id number] DOB: [birth year]   Reason for Encounter: Medication Coordination Call    Recent office visits:  None ID  Recent consult visits:  None ID  Hospital visits:  None in previous 6 months  Medications: Outpatient Encounter Medications as of 03/01/2021  Medication Sig Note   acetaminophen (TYLENOL) 650 MG CR tablet Take 650-1,300 mg by mouth every 8 (eight) hours as needed for pain.    amLODipine (NORVASC) 5 MG tablet TAKE ONE TABLET BY MOUTH EVERY MORNING    buPROPion (WELLBUTRIN XL) 300 MG 24 hr tablet TAKE ONE TABLET BY MOUTH EVERY MORNING    Cholecalciferol (VITAMIN D3 PO) Take 1 tablet by mouth daily.    cyclobenzaprine (FLEXERIL) 10 MG tablet Take 1 tablet (10 mg total) by mouth 3 (three) times daily as needed for muscle spasms.    EPINEPHrine (EPIPEN 2-PAK) 0.3 mg/0.3 mL IJ SOAJ injection Inject 0.3 mLs (0.3 mg total) into the muscle as needed for anaphylaxis.    furosemide (LASIX) 20 MG tablet Take 1 tablet (20 mg total) by mouth daily.    hydrALAZINE (APRESOLINE) 25 MG tablet TAKE ONE TABLET BY MOUTH EVERY MORNING and TAKE ONE TABLET BY MOUTH EVERYDAY AT BEDTIME    HYDROcodone-acetaminophen (NORCO) 7.5-325 MG tablet Take 1 tablet by mouth 2 (two) times daily as needed (migraine headache).    LORazepam (ATIVAN) 1 MG tablet TAKE ONE TABLET BY MOUTH TWICE DAILY    metoprolol succinate (TOPROL-XL) 100 MG 24 hr tablet Take 1 tablet (100 mg total) by mouth daily. Take with or immediately following a meal. 08/17/2020: Pt does not remember exact date of last dose but states that she usually takes at 11am   nitrofurantoin, macrocrystal-monohydrate, (MACROBID) 100 MG capsule Take 1 capsule (100 mg total) by mouth 2 (two) times daily.    omeprazole (PRILOSEC) 20 MG capsule Take 1 capsule (20 mg total) by mouth daily. (Patient taking differently: Take 20 mg by mouth daily as needed (acid reflux).)     ondansetron (ZOFRAN) 4 MG tablet Take 1 tablet (4 mg total) by mouth every 6 (six) hours as needed for nausea.    SUMAtriptan (IMITREX) 100 MG tablet Take one tablet (100 mg) by mouth at onset of migraine headache, may repeat in 2 hours if still needed    tiZANidine (ZANAFLEX) 4 MG tablet Take 4 mg by mouth 2 (two) times daily as needed for muscle spasms.     topiramate (TOPAMAX) 100 MG tablet Take 1.5 tablets (150 mg total) by mouth at bedtime.    venlafaxine XR (EFFEXOR-XR) 75 MG 24 hr capsule TAKE THREE CAPSULES BY MOUTH EVERY MORNING    zolpidem (AMBIEN) 10 MG tablet TAKE ONE TABLET BY MOUTH EVERYDAY AT BEDTIME AS NEEDED FOR SLEEP    No facility-administered encounter medications on file as of 03/01/2021.    Received call from patient regarding medication management via [facility].  Patient requested an acute fill for Zolpidem and Sumatriptan to be delivered, 03/01/21  Pharmacy needs refills? No  Confirmed delivery date of 03/01/21, advised patient that pharmacy will contact them the morning of delivery   Star Rating Drugs: No ACE/ARB   [place] Pharmacist Assistant [phone]

## 2021-03-03 ENCOUNTER — Other Ambulatory Visit: Payer: Self-pay | Admitting: Internal Medicine

## 2021-03-04 MED ORDER — HYDROCODONE-ACETAMINOPHEN 7.5-325 MG PO TABS: 1.0000 | ORAL_TABLET | Freq: Two times a day (BID) | ORAL | 0 refills | Status: DC | PRN

## 2021-03-24 ENCOUNTER — Telehealth: Payer: Self-pay

## 2021-03-24 NOTE — Progress Notes (Signed)
Chronic Care Management Pharmacy Assistant   Name: Michaela Rios  MRN: [id number] DOB: Jul 10, 1951  Reason for Encounter: Medication Coordination Call   Recent office visits:   12/05/19 [name] (PCP) - Pain Management. Start Macrobid 100 mg. F/u 4 months.  09/29/20 Burns (PCP) - Acute Cystitis. Start Fluconazole 150 mg & Bactrim DS 800-160 mg 5 days.  Recent consult visits:  None noted  Hospital visits:  None in previous 6 months  Medications: Outpatient Encounter Medications as of 03/24/2021  Medication Sig Note  . HYDROcodone-acetaminophen (NORCO) 7.5-325 MG tablet Take 1 tablet by mouth 2 (two) times daily as needed (migraine headache).   Marland Kitchen acetaminophen (TYLENOL) 650 MG CR tablet Take 650-1,300 mg by mouth every 8 (eight) hours as needed for pain.   Marland Kitchen amLODipine (NORVASC) 5 MG tablet TAKE ONE TABLET BY MOUTH EVERY MORNING   . buPROPion (WELLBUTRIN XL) 300 MG 24 hr tablet TAKE ONE TABLET BY MOUTH EVERY MORNING   . Cholecalciferol (VITAMIN D3 PO) Take 1 tablet by mouth daily.   . cyclobenzaprine (FLEXERIL) 10 MG tablet Take 1 tablet (10 mg total) by mouth 3 (three) times daily as needed for muscle spasms.   Marland Kitchen EPINEPHrine (EPIPEN 2-PAK) 0.3 mg/0.3 mL IJ SOAJ injection Inject 0.3 mLs (0.3 mg total) into the muscle as needed for anaphylaxis.   . furosemide (LASIX) 20 MG tablet Take 1 tablet (20 mg total) by mouth daily.   . hydrALAZINE (APRESOLINE) 25 MG tablet TAKE ONE TABLET BY MOUTH EVERY MORNING and TAKE ONE TABLET BY MOUTH EVERYDAY AT BEDTIME   . LORazepam (ATIVAN) 1 MG tablet TAKE ONE TABLET BY MOUTH TWICE DAILY   . metoprolol succinate (TOPROL-XL) 100 MG 24 hr tablet Take 1 tablet (100 mg total) by mouth daily. Take with or immediately following a meal. 08/17/2020: Pt does not remember exact date of last dose but states that she usually takes at 11am  . nitrofurantoin, macrocrystal-monohydrate, (MACROBID) 100 MG capsule Take 1 capsule (100 mg total) by mouth 2 (two) times  daily.   Marland Kitchen omeprazole (PRILOSEC) 20 MG capsule Take 1 capsule (20 mg total) by mouth daily. (Patient taking differently: Take 20 mg by mouth daily as needed (acid reflux).)   . ondansetron (ZOFRAN) 4 MG tablet Take 1 tablet (4 mg total) by mouth every 6 (six) hours as needed for nausea.   . SUMAtriptan (IMITREX) 100 MG tablet Take one tablet (100 mg) by mouth at onset of migraine headache, may repeat in 2 hours if still needed   . tiZANidine (ZANAFLEX) 4 MG tablet Take 4 mg by mouth 2 (two) times daily as needed for muscle spasms.    Marland Kitchen topiramate (TOPAMAX) 100 MG tablet Take 1.5 tablets (150 mg total) by mouth at bedtime.   Marland Kitchen venlafaxine XR (EFFEXOR-XR) 75 MG 24 hr capsule TAKE THREE CAPSULES BY MOUTH EVERY MORNING   . zolpidem (AMBIEN) 10 MG tablet TAKE ONE TABLET BY MOUTH EVERYDAY AT BEDTIME AS NEEDED FOR SLEEP    No facility-administered encounter medications on file as of 03/24/2021.    Reviewed chart for medication changes ahead of medication coordination call.  No OVs, Consults, or hospital visits since last care coordination call/Pharmacist visit. (If appropriate, list visit date, provider name)  No medication changes indicated OR if recent visit, treatment plan here.  BP Readings from Last 3 Encounters:  12/04/20 140/86  09/29/20 (!) 146/84  08/19/20 (!) 156/100    Lab Results  Component Value Date   HGBA1C 5.5  04/06/2020     Patient obtains medications through Vials  30 Days   Patient is due for next adherence delivery on: 04/02/21. Called patient and reviewed medications and coordinated delivery.  This delivery to include: Sumatriptan 100 mg  Zolpidem 10 mg  Patient needs refills for none.  Confirmed delivery date of 04/02/21, advised patient that pharmacy will contact them the morning of delivery.  Star Rating Drugs: N/A  [name], RMA Clinical Pharmacists Assistant [phone]  Time Spent: 46

## 2021-03-26 ENCOUNTER — Telehealth: Payer: Self-pay

## 2021-03-26 NOTE — Progress Notes (Signed)
error 

## 2021-03-29 ENCOUNTER — Telehealth: Payer: Self-pay | Admitting: Pharmacist

## 2021-03-29 NOTE — Progress Notes (Signed)
    Chronic Care Management Pharmacy Assistant   Name: Michaela Rios  MRN: [id number] DOB: 1951-07-11   Reason for Encounter:Medication Coordination Call    Recent office visits:  None ID  Recent consult visits:  None ID  Hospital visits:  None in previous 6 months  Medications: Outpatient Encounter Medications as of 03/29/2021  Medication Sig Note  . HYDROcodone-acetaminophen (NORCO) 7.5-325 MG tablet Take 1 tablet by mouth 2 (two) times daily as needed (migraine headache).   Marland Kitchen acetaminophen (TYLENOL) 650 MG CR tablet Take 650-1,300 mg by mouth every 8 (eight) hours as needed for pain.   Marland Kitchen amLODipine (NORVASC) 5 MG tablet TAKE ONE TABLET BY MOUTH EVERY MORNING   . buPROPion (WELLBUTRIN XL) 300 MG 24 hr tablet TAKE ONE TABLET BY MOUTH EVERY MORNING   . Cholecalciferol (VITAMIN D3 PO) Take 1 tablet by mouth daily.   . cyclobenzaprine (FLEXERIL) 10 MG tablet Take 1 tablet (10 mg total) by mouth 3 (three) times daily as needed for muscle spasms.   Marland Kitchen EPINEPHrine (EPIPEN 2-PAK) 0.3 mg/0.3 mL IJ SOAJ injection Inject 0.3 mLs (0.3 mg total) into the muscle as needed for anaphylaxis.   . furosemide (LASIX) 20 MG tablet Take 1 tablet (20 mg total) by mouth daily.   . hydrALAZINE (APRESOLINE) 25 MG tablet TAKE ONE TABLET BY MOUTH EVERY MORNING and TAKE ONE TABLET BY MOUTH EVERYDAY AT BEDTIME   . LORazepam (ATIVAN) 1 MG tablet TAKE ONE TABLET BY MOUTH TWICE DAILY   . metoprolol succinate (TOPROL-XL) 100 MG 24 hr tablet Take 1 tablet (100 mg total) by mouth daily. Take with or immediately following a meal. 08/17/2020: Pt does not remember exact date of last dose but states that she usually takes at 11am  . nitrofurantoin, macrocrystal-monohydrate, (MACROBID) 100 MG capsule Take 1 capsule (100 mg total) by mouth 2 (two) times daily.   Marland Kitchen omeprazole (PRILOSEC) 20 MG capsule Take 1 capsule (20 mg total) by mouth daily. (Patient taking differently: Take 20 mg by mouth daily as needed (acid reflux).)    . ondansetron (ZOFRAN) 4 MG tablet Take 1 tablet (4 mg total) by mouth every 6 (six) hours as needed for nausea.   . SUMAtriptan (IMITREX) 100 MG tablet Take one tablet (100 mg) by mouth at onset of migraine headache, may repeat in 2 hours if still needed   . tiZANidine (ZANAFLEX) 4 MG tablet Take 4 mg by mouth 2 (two) times daily as needed for muscle spasms.    Marland Kitchen topiramate (TOPAMAX) 100 MG tablet Take 1.5 tablets (150 mg total) by mouth at bedtime.   Marland Kitchen venlafaxine XR (EFFEXOR-XR) 75 MG 24 hr capsule TAKE THREE CAPSULES BY MOUTH EVERY MORNING   . zolpidem (AMBIEN) 10 MG tablet TAKE ONE TABLET BY MOUTH EVERYDAY AT BEDTIME AS NEEDED FOR SLEEP    No facility-administered encounter medications on file as of 03/29/2021.    Received call from patient regarding medication management via [facility].  Patient requested an acute fill for zolpidem and sumatriptan to be delivered: 03/31/21 Pharmacy needs refills? No  Confirmed delivery date of 03/31/21, advised patient that pharmacy will contact them the morning of delivery.   [place] Pharmacist Assistant [phone]  Time spent:15

## 2021-04-06 ENCOUNTER — Other Ambulatory Visit: Payer: Self-pay

## 2021-04-06 NOTE — Progress Notes (Signed)
Subjective:    Patient ID: Michaela Rios, female    DOB: 31-Jul-1951, 70 y.o.   MRN: [id number]  HPI The patient is here for follow up of their chronic medical problems, including chronic pain management for migraines, htn, postherpetic neuralgia, insomnia, depression, anxiety  The patient is here for follow up for chronic pain management for migraines.  Medication and dose: vicodin 7.5-325 mg 1 tab qd prn.  Receives 15 pills a month.  She last took a pill this morning.  Pain assessment:  Pain intensity: Severe Amount of pain relief with medication: Helps tremendously if she needs to take it Use of pain medications: appropriately taking medications, but these past couple of days did take it for her bladder pain and I advised that she cannot take this for anything other than migraines Side effects:  no   She has a uti and has had it for a while.  She has been experiencing dysuria, increased urinary frequency and urgency for about 3 months.  She also states occasional lower pelvic/bladder pain at times.  She has feet swelling, bad headaches, which she is unsure if that is related to the possible urine infection or not.  Her feet have been swelling for 2 weeks.  She denies any changes in diet, activity and medications.  She is taking her furosemide daily.   Medications and allergies reviewed with patient and updated if appropriate.  Patient Active Problem List   Diagnosis Date Noted  . Numbness and tingling in right hand 09/08/2020  . Rhabdomyolysis 08/18/2020  . Nausea & vomiting 08/18/2020  . Intractable nausea and vomiting 08/18/2020  . Elevated LFTs 08/17/2020  . Hypokalemia 08/17/2020  . Elevated troponin 08/17/2020  . Post herpetic neuralgia 06/30/2020  . Urinary urgency 04/06/2020  . Left lumbar radiculopathy 06/04/2018  . CKD (chronic kidney disease) stage 3, GFR 30-59 ml/min (HCC) 06/04/2018  . Pain management contract signed 06/03/2018  . Encounter for pain management  03/05/2018  . Memory difficulties 03/05/2018  . Sialadenitis, right parotid 06/24/2017  . Insomnia 05/12/2017  . Prediabetes 11/08/2016  . Vitamin D deficiency 06/08/2009  . Depression 01/07/2009  . PEPTIC ULCER DISEASE, CHRONIC 01/07/2009  . OTHER OBSTRUCTION OF DUODENUM 01/07/2009  . HIATAL HERNIA 01/07/2009  . Irritable bowel syndrome 01/07/2009  . DEGENERATIVE JOINT DISEASE 01/07/2009  . ANEMIA, HX OF 01/07/2009  . PULMONARY NODULE 06/09/2008  . Anxiety 04/01/2008  . Essential hypertension 04/01/2008  . Allergic rhinitis 04/01/2008  . GERD 04/01/2008  . LOW BACK PAIN SYNDROME 04/01/2008  . Migraines 04/01/2008    Current Outpatient Medications on File Prior to Visit  Medication Sig Dispense Refill  . acetaminophen (TYLENOL) 650 MG CR tablet Take 650-1,300 mg by mouth every 8 (eight) hours as needed for pain.    Marland Kitchen amLODipine (NORVASC) 5 MG tablet TAKE ONE TABLET BY MOUTH EVERY MORNING 90 tablet 1  . buPROPion (WELLBUTRIN XL) 300 MG 24 hr tablet TAKE ONE TABLET BY MOUTH EVERY MORNING 90 tablet 2  . Cholecalciferol (VITAMIN D3 PO) Take 1 tablet by mouth daily.    . cyclobenzaprine (FLEXERIL) 10 MG tablet Take 1 tablet (10 mg total) by mouth 3 (three) times daily as needed for muscle spasms. 90 tablet 0  . EPINEPHrine (EPIPEN 2-PAK) 0.3 mg/0.3 mL IJ SOAJ injection Inject 0.3 mLs (0.3 mg total) into the muscle as needed for anaphylaxis. 1 each 0  . furosemide (LASIX) 20 MG tablet Take 1 tablet (20 mg total) by mouth daily. [place]  tablet 2  . hydrALAZINE (APRESOLINE) 25 MG tablet TAKE ONE TABLET BY MOUTH EVERY MORNING and TAKE ONE TABLET BY MOUTH EVERYDAY AT BEDTIME 90 tablet 6  . HYDROcodone-acetaminophen (NORCO) 7.5-325 MG tablet Take 1 tablet by mouth 2 (two) times daily as needed (migraine headache). 15 tablet 0  . LORazepam (ATIVAN) 1 MG tablet TAKE ONE TABLET BY MOUTH TWICE DAILY 180 tablet 0  . metoprolol succinate (TOPROL-XL) 100 MG 24 hr tablet Take 1 tablet (100 mg total) by  mouth daily. Take with or immediately following a meal. 90 tablet 3  . omeprazole (PRILOSEC) 20 MG capsule Take 1 capsule (20 mg total) by mouth daily. (Patient taking differently: Take 20 mg by mouth daily as needed (acid reflux).) 90 capsule 3  . ondansetron (ZOFRAN) 4 MG tablet Take 1 tablet (4 mg total) by mouth every 6 (six) hours as needed for nausea. 20 tablet 0  . SUMAtriptan (IMITREX) 100 MG tablet Take one tablet (100 mg) by mouth at onset of migraine headache, may repeat in 2 hours if still needed 10 tablet 5  . topiramate (TOPAMAX) 100 MG tablet Take 1.5 tablets (150 mg total) by mouth at bedtime. 135 tablet 1  . venlafaxine XR (EFFEXOR-XR) 75 MG 24 hr capsule TAKE THREE CAPSULES BY MOUTH EVERY MORNING 270 capsule 6  . zolpidem (AMBIEN) 10 MG tablet TAKE ONE TABLET BY MOUTH EVERYDAY AT BEDTIME AS NEEDED FOR SLEEP 30 tablet 1   No current facility-administered medications on file prior to visit.    Past Medical History:  Diagnosis Date  . Anemia   . Anxiety   . Arthritis   . Cataract   . Chronic migraine    follows with neuro for same  . Clotting disorder (HCC)    clot in ovarian vein- hx  . Esophagitis   . GERD (gastroesophageal reflux disease)   . Hypertension   . IBS (irritable bowel syndrome)   . Low back pain syndrome   . MRSA (methicillin resistant Staphylococcus aureus) infection 04/17/12   left torso  . Peptic ulcer disease 2003, 12/2012  . Peripheral vascular disease ([place])   . Vitamin D deficiency     Past Surgical History:  Procedure Laterality Date  . ABDOMINAL HYSTERECTOMY    . LAPAROSCOPIC BILATERAL SALPINGO OOPHERECTOMY    . pud surgery w/duod sticture-plasty and vagotomy  2003   Dr. Marlou Starks  . TONSILLECTOMY AND ADENOIDECTOMY     as a child  . TUBAL LIGATION  1984    Social History   Socioeconomic History  . Marital status: Married    Spouse name: Levada Dy  . Number of children: 1  . Years of education: Not on file  . Highest education level: Not on  file  Occupational History  . Occupation: retired  Tobacco Use  . Smoking status: Never Smoker  . Smokeless tobacco: Never Used  Vaping Use  . Vaping Use: Never used  Substance and Sexual Activity  . Alcohol use: Yes    Comment: rare  . Drug use: No  . Sexual activity: Yes  Other Topics Concern  . Not on file  Social History Narrative   Lives with spouse    Grown son in [place], 2 g kids   Social Determinants of Health   Financial Resource Strain: Medium Risk  . Difficulty of Paying Living Expenses: Somewhat hard  Food Insecurity: No Food Insecurity  . Worried About Charity fundraiser in the Last Year: Never true  . Ran Out  of Food in the Last Year: Never true  Transportation Needs: No Transportation Needs  . Lack of Transportation (Medical): No  . Lack of Transportation (Non-Medical): No  Physical Activity: Inactive  . Days of Exercise per Week: 0 days  . Minutes of Exercise per Session: 0 min  Stress: Stress Concern Present  . Feeling of Stress : Very much  Social Connections: Moderately Isolated  . Frequency of Communication with Friends and Family: More than three times a week  . Frequency of Social Gatherings with Friends and Family: Never  . Attends Religious Services: Never  . Active Member of Clubs or Organizations: No  . Attends Archivist Meetings: Never  . Marital Status: Married    Family History  Problem Relation Age of Onset  . Aneurysm Father   . Prostate cancer Brother   . Lung cancer Brother   . Colon cancer Neg Hx   . Esophageal cancer Neg Hx   . Rectal cancer Neg Hx   . Stomach cancer Neg Hx     Review of Systems  Constitutional: Negative for chills and fever.  HENT: Positive for nosebleeds (x 1).   Respiratory: Positive for cough. Negative for shortness of breath and wheezing.   Cardiovascular: Positive for leg swelling (2 weeks of ankle and feet swelling). Negative for chest pain and palpitations.  Gastrointestinal: Positive for  abdominal pain. Negative for constipation and diarrhea.  Genitourinary: Positive for dysuria, frequency and urgency.  Neurological: Positive for headaches (chronic).       Objective:   Vitals:   04/07/21 1412  BP: 140/88  Pulse: 88  Temp: 98.4 F (36.9 C)  SpO2: 93%   BP Readings from Last 3 Encounters:  04/07/21 140/88  12/04/20 140/86  09/29/20 (!) 146/84   Wt Readings from Last 3 Encounters:  04/07/21 134 lb (60.8 kg)  12/04/20 132 lb (59.9 kg)  09/29/20 136 lb 9.6 oz (62 kg)   Body mass index is 23.74 kg/m.   Physical Exam    Constitutional: Appears well-developed and well-nourished. No distress.  HENT:  Head: Normocephalic and atraumatic.  Neck: Neck supple. No tracheal deviation present. No thyromegaly present.  No cervical lymphadenopathy Cardiovascular: Normal rate, regular rhythm and normal heart sounds.   No murmur heard. No carotid bruit .  Trace bilateral lower extremity edema Pulmonary/Chest: Effort normal and breath sounds normal. No respiratory distress. No has no wheezes. No rales. Abdomen: Soft, nontender, nondistended Skin: Skin is warm and dry. Not diaphoretic.  Psychiatric: Normal mood and affect. Behavior is normal.      Assessment & Plan:    See Problem List for Assessment and Plan of chronic medical problems.    This visit occurred during the SARS-CoV-2 public health emergency.  Safety protocols were in place, including screening questions prior to the visit, additional usage of staff PPE, and extensive cleaning of exam room while observing appropriate contact time as indicated for disinfecting solutions.

## 2021-04-06 NOTE — Patient Instructions (Addendum)
  Blood work was ordered.     Medications changes include :   none  Your prescription(s) have been submitted to your pharmacy. Please take as directed and contact our office if you believe you are having problem(s) with the medication(s).    Please followup in 3 months   

## 2021-04-07 ENCOUNTER — Other Ambulatory Visit: Payer: Self-pay

## 2021-04-07 ENCOUNTER — Ambulatory Visit (INDEPENDENT_AMBULATORY_CARE_PROVIDER_SITE_OTHER): Payer: Medicare HMO | Admitting: Internal Medicine

## 2021-04-07 ENCOUNTER — Encounter: Payer: Self-pay | Admitting: Internal Medicine

## 2021-04-07 VITALS — BP 140/88 | HR 88 | Temp 98.4°F | Ht 63.0 in | Wt 134.0 lb

## 2021-04-07 DIAGNOSIS — I10 Essential (primary) hypertension: Secondary | ICD-10-CM

## 2021-04-07 DIAGNOSIS — R69 Illness, unspecified: Secondary | ICD-10-CM | POA: Diagnosis not present

## 2021-04-07 DIAGNOSIS — F419 Anxiety disorder, unspecified: Secondary | ICD-10-CM

## 2021-04-07 DIAGNOSIS — G4709 Other insomnia: Secondary | ICD-10-CM

## 2021-04-07 DIAGNOSIS — R350 Frequency of micturition: Secondary | ICD-10-CM | POA: Diagnosis not present

## 2021-04-07 DIAGNOSIS — F3289 Other specified depressive episodes: Secondary | ICD-10-CM

## 2021-04-07 DIAGNOSIS — N1832 Chronic kidney disease, stage 3b: Secondary | ICD-10-CM

## 2021-04-07 DIAGNOSIS — R52 Pain, unspecified: Secondary | ICD-10-CM | POA: Diagnosis not present

## 2021-04-07 DIAGNOSIS — G43909 Migraine, unspecified, not intractable, without status migrainosus: Secondary | ICD-10-CM

## 2021-04-07 DIAGNOSIS — R7303 Prediabetes: Secondary | ICD-10-CM

## 2021-04-07 LAB — CBC WITH DIFFERENTIAL/PLATELET
Basophils Absolute: 0.1 10*3/uL (ref 0.0–0.1)
Basophils Relative: 0.9 % (ref 0.0–3.0)
Eosinophils Absolute: 0.2 10*3/uL (ref 0.0–0.7)
Eosinophils Relative: 3.1 % (ref 0.0–5.0)
HCT: 33.2 % — ABNORMAL LOW (ref 36.0–46.0)
Hemoglobin: 11 g/dL — ABNORMAL LOW (ref 12.0–15.0)
Lymphocytes Relative: 15.3 % (ref 12.0–46.0)
Lymphs Abs: 0.9 10*3/uL (ref 0.7–4.0)
MCHC: 33 g/dL (ref 30.0–36.0)
MCV: 93.2 fl (ref 78.0–100.0)
Monocytes Absolute: 0.7 10*3/uL (ref 0.1–1.0)
Monocytes Relative: 11.1 % (ref 3.0–12.0)
Neutro Abs: 4.1 10*3/uL (ref 1.4–7.7)
Neutrophils Relative %: 69.6 % (ref 43.0–77.0)
Platelets: 78 10*3/uL — ABNORMAL LOW (ref 150.0–400.0)
RBC: 3.56 Mil/uL — ABNORMAL LOW (ref 3.87–5.11)
RDW: 15.3 % (ref 11.5–15.5)
WBC: 5.9 10*3/uL (ref 4.0–10.5)

## 2021-04-07 LAB — URINALYSIS, ROUTINE W REFLEX MICROSCOPIC
Bilirubin Urine: NEGATIVE
Hgb urine dipstick: NEGATIVE
Ketones, ur: NEGATIVE
Nitrite: POSITIVE — AB
Specific Gravity, Urine: 1.025 (ref 1.000–1.030)
Total Protein, Urine: NEGATIVE
Urine Glucose: NEGATIVE
Urobilinogen, UA: 0.2 (ref 0.0–1.0)
pH: 5.5 (ref 5.0–8.0)

## 2021-04-07 LAB — COMPREHENSIVE METABOLIC PANEL
ALT: 16 U/L (ref 0–35)
AST: 19 U/L (ref 0–37)
Albumin: 4.1 g/dL (ref 3.5–5.2)
Alkaline Phosphatase: 104 U/L (ref 39–117)
BUN: 23 mg/dL (ref 6–23)
CO2: 26 mEq/L (ref 19–32)
Calcium: 9.3 mg/dL (ref 8.4–10.5)
Chloride: 107 mEq/L (ref 96–112)
Creatinine, Ser: 1.26 mg/dL — ABNORMAL HIGH (ref 0.40–1.20)
GFR: 43.25 mL/min — ABNORMAL LOW (ref >60.00)
Glucose, Bld: 74 mg/dL (ref 70–99)
Potassium: 3.6 mEq/L (ref 3.5–5.1)
Sodium: 143 mEq/L (ref 135–145)
Total Bilirubin: 0.3 mg/dL (ref 0.2–1.2)
Total Protein: 7.6 g/dL (ref 6.0–8.3)

## 2021-04-07 LAB — HEMOGLOBIN A1C: Hgb A1c MFr Bld: 5.8 % (ref 4.6–6.5)

## 2021-04-07 MED ORDER — LORAZEPAM 1 MG PO TABS: 1.0000 mg | ORAL_TABLET | Freq: Two times a day (BID) | ORAL | 0 refills | Status: DC

## 2021-04-07 MED ORDER — HYDROCODONE-ACETAMINOPHEN 7.5-325 MG PO TABS: 1.0000 | ORAL_TABLET | Freq: Two times a day (BID) | ORAL | 0 refills | Status: DC | PRN

## 2021-04-07 MED ORDER — TIZANIDINE HCL 4 MG PO TABS: 4.0000 mg | ORAL_TABLET | Freq: Two times a day (BID) | ORAL | 1 refills | Status: DC | PRN

## 2021-04-07 MED ORDER — SUMATRIPTAN SUCCINATE 100 MG PO TABS: ORAL_TABLET | ORAL | 3 refills | Status: DC

## 2021-04-07 NOTE — Assessment & Plan Note (Signed)
Chronic Check A1c 

## 2021-04-07 NOTE — Assessment & Plan Note (Signed)
Chronic No longer seeing neurology Has chronic daily headaches Has tried multiple medications and they have not been effective, Botox not effective and too expensive Continue Topamax 150 mg at bedtime Continue tizanidine 4 mg as needed for migraines Continue Imitrex 100 mg as needed for migraines Continue Zofran 4 mg as needed for migraines Continue Vicodin 7.5-325 mg daily as needed for migraines Will increase the Vicodin 20 pills, but stressed she can only take this for her migraines and this should be a last resort-typically this lasts more than 1 month

## 2021-04-07 NOTE — Assessment & Plan Note (Signed)
Chronic Controlled Continue Effexor to 25 mg daily

## 2021-04-07 NOTE — Assessment & Plan Note (Signed)
Subacute Has been having urinary frequency, dysuria and urinary urgency for 3-4 months Check UA, urine culture to rule out UTI Could also have interstitial cystitis

## 2021-04-07 NOTE — Assessment & Plan Note (Signed)
Chronic Has severe chronic daily headaches Has seen neurology and has tried multiple medications in the past nothing has worked well Regimen she is currently on she would like to stick with it does not want to try anything new She uses the Vicodin 7.5-325 mg daily for migraines only when everything else does not work and it is effective Will continue Vicodin as above, and will give her 20 pills-this should be lasting approximately 2 months and if it is not I will scale back on home the pills she is receiving

## 2021-04-07 NOTE — Assessment & Plan Note (Signed)
Chronic She is in pain today and blood pressure seems to be typically better controlled than today No change in medication Continue amlodipine 5 mg daily, furosemide 20 mg daily, hydralazine 25 mg twice daily and metoprolol XL 100 mg daily CMP

## 2021-04-07 NOTE — Assessment & Plan Note (Signed)
Chronic CMP Stressed increasing fluids Discussed that if she think she has a urinary tract infection she needs to come in so that this does not damage her kidneys or lead to sepsis

## 2021-04-07 NOTE — Assessment & Plan Note (Signed)
Chronic Controlled Continue Ambien 10 mg daily

## 2021-04-07 NOTE — Assessment & Plan Note (Signed)
Chronic Fairly controlled Continue lorazepam 1 mg twice daily and Effexor to 25 mg daily

## 2021-04-09 LAB — URINE CULTURE

## 2021-04-09 MED ORDER — NITROFURANTOIN MONOHYD MACRO 100 MG PO CAPS: 100.0000 mg | ORAL_CAPSULE | Freq: Two times a day (BID) | ORAL | 0 refills | Status: DC

## 2021-04-09 NOTE — Addendum Note (Signed)
Addended by: Binnie Rail on: 04/09/2021 04:26 PM   Modules accepted: Orders

## 2021-04-14 ENCOUNTER — Encounter: Payer: Self-pay | Admitting: Internal Medicine

## 2021-04-14 DIAGNOSIS — N1832 Chronic kidney disease, stage 3b: Secondary | ICD-10-CM

## 2021-04-15 ENCOUNTER — Telehealth: Payer: Self-pay | Admitting: Pharmacist

## 2021-04-15 NOTE — Progress Notes (Signed)
    Chronic Care Management Pharmacy Assistant   Name: Michaela Rios  MRN: [id number] DOB: 12-22-1950    Reason for Encounter: Medication Review    Recent office visits:  04/07/21 Dr. [name]  Recent consult visits:  None ID  Hospital visits:  None in previous 6 months  Medications: Outpatient Encounter Medications as of 04/15/2021  Medication Sig Note  . acetaminophen (TYLENOL) 650 MG CR tablet Take 650-1,300 mg by mouth every 8 (eight) hours as needed for pain.   Marland Kitchen amLODipine (NORVASC) 5 MG tablet TAKE ONE TABLET BY MOUTH EVERY MORNING   . buPROPion (WELLBUTRIN XL) 300 MG 24 hr tablet TAKE ONE TABLET BY MOUTH EVERY MORNING   . Cholecalciferol (VITAMIN D3 PO) Take 1 tablet by mouth daily.   Marland Kitchen EPINEPHrine (EPIPEN 2-PAK) 0.3 mg/0.3 mL IJ SOAJ injection Inject 0.3 mLs (0.3 mg total) into the muscle as needed for anaphylaxis.   . furosemide (LASIX) 20 MG tablet Take 1 tablet (20 mg total) by mouth daily.   . hydrALAZINE (APRESOLINE) 25 MG tablet TAKE ONE TABLET BY MOUTH EVERY MORNING and TAKE ONE TABLET BY MOUTH EVERYDAY AT BEDTIME   . HYDROcodone-acetaminophen (NORCO) 7.5-325 MG tablet Take 1 tablet by mouth 2 (two) times daily as needed (migraine headache).   . LORazepam (ATIVAN) 1 MG tablet Take 1 tablet (1 mg total) by mouth 2 (two) times daily.   . metoprolol succinate (TOPROL-XL) 100 MG 24 hr tablet Take 1 tablet (100 mg total) by mouth daily. Take with or immediately following a meal. 08/17/2020: Pt does not remember exact date of last dose but states that she usually takes at 11am  . nitrofurantoin, macrocrystal-monohydrate, (MACROBID) 100 MG capsule Take 1 capsule (100 mg total) by mouth 2 (two) times daily.   Marland Kitchen omeprazole (PRILOSEC) 20 MG capsule Take 1 capsule (20 mg total) by mouth daily. (Patient taking differently: Take 20 mg by mouth daily as needed (acid reflux).)   . ondansetron (ZOFRAN) 4 MG tablet Take 1 tablet (4 mg total) by mouth every 6 (six) hours as needed for  nausea.   . SUMAtriptan (IMITREX) 100 MG tablet Take one tablet (100 mg) by mouth at onset of migraine headache, may repeat in 2 hours if still needed   . tiZANidine (ZANAFLEX) 4 MG tablet Take 1 tablet (4 mg total) by mouth 2 (two) times daily as needed for muscle spasms.   Marland Kitchen topiramate (TOPAMAX) 100 MG tablet Take 1.5 tablets (150 mg total) by mouth at bedtime.   Marland Kitchen venlafaxine XR (EFFEXOR-XR) 75 MG 24 hr capsule TAKE THREE CAPSULES BY MOUTH EVERY MORNING   . zolpidem (AMBIEN) 10 MG tablet TAKE ONE TABLET BY MOUTH EVERYDAY AT BEDTIME AS NEEDED FOR SLEEP    No facility-administered encounter medications on file as of 04/15/2021.    Received call from patient regarding medication management via [facility].  Patient requested an acute fill for Sumatripan to be delivered: 04/16/21 Pharmacy needs refills? Yes  Confirmed delivery date of 04/16/21, advised patient that pharmacy will contact them the morning of delivery.  [facility] Pharmacist Assistant [phone]  Time spent:20

## 2021-04-18 ENCOUNTER — Other Ambulatory Visit: Payer: Self-pay | Admitting: Internal Medicine

## 2021-04-19 ENCOUNTER — Other Ambulatory Visit: Payer: Self-pay | Admitting: Internal Medicine

## 2021-04-21 ENCOUNTER — Other Ambulatory Visit: Payer: Self-pay | Admitting: Internal Medicine

## 2021-04-27 ENCOUNTER — Other Ambulatory Visit: Payer: Self-pay | Admitting: Internal Medicine

## 2021-04-27 ENCOUNTER — Encounter: Payer: Self-pay | Admitting: Internal Medicine

## 2021-04-27 NOTE — Progress Notes (Signed)
    Chronic Care Management Pharmacy Assistant   Name: Michaela Rios  MRN: [id number] DOB: 19-May-1951   Medications: Outpatient Encounter Medications as of 04/15/2021  Medication Sig Note  . acetaminophen (TYLENOL) 650 MG CR tablet Take 650-1,300 mg by mouth every 8 (eight) hours as needed for pain.   Marland Kitchen amLODipine (NORVASC) 5 MG tablet TAKE ONE TABLET BY MOUTH EVERY MORNING   . buPROPion (WELLBUTRIN XL) 300 MG 24 hr tablet TAKE ONE TABLET BY MOUTH EVERY MORNING   . Cholecalciferol (VITAMIN D3 PO) Take 1 tablet by mouth daily.   Marland Kitchen EPINEPHrine (EPIPEN 2-PAK) 0.3 mg/0.3 mL IJ SOAJ injection Inject 0.3 mLs (0.3 mg total) into the muscle as needed for anaphylaxis.   . hydrALAZINE (APRESOLINE) 25 MG tablet TAKE ONE TABLET BY MOUTH EVERY MORNING and TAKE ONE TABLET BY MOUTH EVERYDAY AT BEDTIME   . HYDROcodone-acetaminophen (NORCO) 7.5-325 MG tablet Take 1 tablet by mouth 2 (two) times daily as needed (migraine headache).   . nitrofurantoin, macrocrystal-monohydrate, (MACROBID) 100 MG capsule Take 1 capsule (100 mg total) by mouth 2 (two) times daily.   Marland Kitchen omeprazole (PRILOSEC) 20 MG capsule Take 1 capsule (20 mg total) by mouth daily. (Patient taking differently: Take 20 mg by mouth daily as needed (acid reflux).)   . ondansetron (ZOFRAN) 4 MG tablet Take 1 tablet (4 mg total) by mouth every 6 (six) hours as needed for nausea.   . SUMAtriptan (IMITREX) 100 MG tablet Take one tablet (100 mg) by mouth at onset of migraine headache, may repeat in 2 hours if still needed   . tiZANidine (ZANAFLEX) 4 MG tablet Take 1 tablet (4 mg total) by mouth 2 (two) times daily as needed for muscle spasms.   Marland Kitchen topiramate (TOPAMAX) 100 MG tablet Take 1.5 tablets (150 mg total) by mouth at bedtime.   Marland Kitchen venlafaxine XR (EFFEXOR-XR) 75 MG 24 hr capsule TAKE THREE CAPSULES BY MOUTH EVERY MORNING   . [DISCONTINUED] furosemide (LASIX) 20 MG tablet Take 1 tablet (20 mg total) by mouth daily.   . [DISCONTINUED] LORazepam  (ATIVAN) 1 MG tablet Take 1 tablet (1 mg total) by mouth 2 (two) times daily.   . [DISCONTINUED] metoprolol succinate (TOPROL-XL) 100 MG 24 hr tablet Take 1 tablet (100 mg total) by mouth daily. Take with or immediately following a meal. 08/17/2020: Pt does not remember exact date of last dose but states that she usually takes at 11am  . [DISCONTINUED] zolpidem (AMBIEN) 10 MG tablet TAKE ONE TABLET BY MOUTH EVERYDAY AT BEDTIME AS NEEDED FOR SLEEP    No facility-administered encounter medications on file as of 04/15/2021.    Pharmacist Review  Reviewed chart for medication changes and adherence.  No OVs, Consults, or hospital visits since last care coordination call / Pharmacist visit. No medication changes indicated  No gaps in adherence identified. Patient has follow up scheduled with pharmacy team. No further action required.   [place] Pharmacist Assistant [phone]  Time spent:5

## 2021-04-28 ENCOUNTER — Other Ambulatory Visit: Payer: Self-pay | Admitting: Internal Medicine

## 2021-04-28 MED ORDER — ZOLPIDEM TARTRATE 10 MG PO TABS: ORAL_TABLET | ORAL | 1 refills | Status: DC

## 2021-04-29 MED ORDER — ZOLPIDEM TARTRATE 10 MG PO TABS: ORAL_TABLET | ORAL | 1 refills | Status: AC

## 2021-04-29 MED ORDER — LORAZEPAM 1 MG PO TABS: 1.0000 mg | ORAL_TABLET | Freq: Two times a day (BID) | ORAL | 0 refills | Status: AC | PRN

## 2021-04-29 NOTE — Telephone Encounter (Signed)
I got a message today from [facility] that the Controlled substance e-scribing issue has been fixed.

## 2021-05-03 ENCOUNTER — Other Ambulatory Visit: Payer: Self-pay | Admitting: Internal Medicine

## 2021-05-19 ENCOUNTER — Other Ambulatory Visit: Payer: Self-pay | Admitting: Internal Medicine

## 2021-05-19 ENCOUNTER — Telehealth: Payer: Self-pay | Admitting: Pharmacist

## 2021-05-19 MED ORDER — HYDROCODONE-ACETAMINOPHEN 7.5-325 MG PO TABS: 1.0000 | ORAL_TABLET | Freq: Two times a day (BID) | ORAL | 0 refills | Status: DC | PRN

## 2021-05-19 NOTE — Telephone Encounter (Signed)
Patient requests refill for Hydrocodone-APAP 7.5-325 mg.  Last filled 04/07/21 #20.  Preferred pharmacy:  [facility] - [place], [place] - [address] [place] [address] Phone: [phone] Fax: [phone]

## 2021-05-19 NOTE — Progress Notes (Signed)
    Chronic Care Management Pharmacy Assistant   Name: WESLIE RASMUS  MRN: [id number] DOB: 1951-05-16   Reason for Encounter: Medication Review   Recent office visits:  None ID  Recent consult visits:  None ID  Hospital visits:  None in previous 6 months  Medications: Outpatient Encounter Medications as of 05/19/2021  Medication Sig   acetaminophen (TYLENOL) 650 MG CR tablet Take 650-1,300 mg by mouth every 8 (eight) hours as needed for pain.   amLODipine (NORVASC) 5 MG tablet TAKE ONE TABLET BY MOUTH EVERY MORNING   buPROPion (WELLBUTRIN XL) 300 MG 24 hr tablet TAKE ONE TABLET BY MOUTH EVERY MORNING   Cholecalciferol (VITAMIN D3 PO) Take 1 tablet by mouth daily.   EPINEPHrine (EPIPEN 2-PAK) 0.3 mg/0.3 mL IJ SOAJ injection Inject 0.3 mLs (0.3 mg total) into the muscle as needed for anaphylaxis.   furosemide (LASIX) 20 MG tablet TAKE ONE TABLET BY MOUTH EVERY MORNING   hydrALAZINE (APRESOLINE) 25 MG tablet TAKE ONE TABLET BY MOUTH EVERY MORNING and TAKE ONE TABLET BY MOUTH EVERYDAY AT BEDTIME   HYDROcodone-acetaminophen (NORCO) 7.5-325 MG tablet Take 1 tablet by mouth 2 (two) times daily as needed (migraine headache).   LORazepam (ATIVAN) 1 MG tablet Take 1 tablet (1 mg total) by mouth 2 (two) times daily as needed.   metoprolol succinate (TOPROL-XL) 100 MG 24 hr tablet TAKE ONE TABLET BY MOUTH EVERY MORNING WITH OR immediately following A meal   nitrofurantoin, macrocrystal-monohydrate, (MACROBID) 100 MG capsule Take 1 capsule (100 mg total) by mouth 2 (two) times daily.   omeprazole (PRILOSEC) 20 MG capsule Take 1 capsule (20 mg total) by mouth daily. (Patient taking differently: Take 20 mg by mouth daily as needed (acid reflux).)   ondansetron (ZOFRAN) 4 MG tablet Take 1 tablet (4 mg total) by mouth every 6 (six) hours as needed for nausea.   SUMAtriptan (IMITREX) 100 MG tablet Take one tablet (100 mg) by mouth at onset of migraine headache, may repeat in 2 hours if still needed    tiZANidine (ZANAFLEX) 4 MG tablet TAKE ONE TABLET BY MOUTH twice daily AS NEEDED FOR muscle SPASMS   topiramate (TOPAMAX) 100 MG tablet Take 1.5 tablets (150 mg total) by mouth at bedtime.   venlafaxine XR (EFFEXOR-XR) 75 MG 24 hr capsule TAKE THREE CAPSULES BY MOUTH EVERY MORNING   zolpidem (AMBIEN) 10 MG tablet TAKE ONE TABLET BY MOUTH EVERYDAY AT BEDTIME AS NEEDED FOR SLEEP   No facility-administered encounter medications on file as of 05/19/2021.    Pharmacist Review Received call from patient regarding medication management via [facility].  Patient requested an acute fill for Sumatriptan, Zolpidem and Hydrocodone to be delivered: 05/21/21 Pharmacy needs refills? Yes, for hydrocodone - [facility] to request  Confirmed delivery date of 05/21/21, advised patient that pharmacy will contact them the morning of delivery.   Star Rating Drugs: None ID  Ethelene Hal Clinical Pharmacist Assistant [phone]   Time spent:25

## 2021-05-20 ENCOUNTER — Encounter: Payer: Self-pay | Admitting: Internal Medicine

## 2021-05-24 ENCOUNTER — Other Ambulatory Visit: Payer: Self-pay

## 2021-05-24 ENCOUNTER — Encounter (HOSPITAL_COMMUNITY): Payer: Self-pay | Admitting: Emergency Medicine

## 2021-05-24 ENCOUNTER — Emergency Department (HOSPITAL_COMMUNITY): Payer: Medicare HMO

## 2021-05-24 ENCOUNTER — Inpatient Hospital Stay (HOSPITAL_COMMUNITY)
Admission: EM | Admit: 2021-05-24 | Discharge: 2021-06-08 | DRG: 040 | Disposition: A | Discharge status: Rehabilitation Facility | Payer: Medicare HMO | Attending: Internal Medicine | Admitting: Internal Medicine

## 2021-05-24 DIAGNOSIS — G8929 Other chronic pain: Secondary | ICD-10-CM | POA: Diagnosis present

## 2021-05-24 DIAGNOSIS — E869 Volume depletion, unspecified: Secondary | ICD-10-CM | POA: Diagnosis present

## 2021-05-24 DIAGNOSIS — R000 Tachycardia, unspecified: Secondary | ICD-10-CM | POA: Diagnosis present

## 2021-05-24 DIAGNOSIS — I6381 Other cerebral infarction due to occlusion or stenosis of small artery: Secondary | ICD-10-CM | POA: Diagnosis not present

## 2021-05-24 DIAGNOSIS — Z8711 Personal history of peptic ulcer disease: Secondary | ICD-10-CM

## 2021-05-24 DIAGNOSIS — Z880 Allergy status to penicillin: Secondary | ICD-10-CM

## 2021-05-24 DIAGNOSIS — R509 Fever, unspecified: Secondary | ICD-10-CM

## 2021-05-24 DIAGNOSIS — R7303 Prediabetes: Secondary | ICD-10-CM | POA: Diagnosis present

## 2021-05-24 DIAGNOSIS — R350 Frequency of micturition: Secondary | ICD-10-CM | POA: Diagnosis not present

## 2021-05-24 DIAGNOSIS — G43909 Migraine, unspecified, not intractable, without status migrainosus: Secondary | ICD-10-CM | POA: Diagnosis present

## 2021-05-24 DIAGNOSIS — R911 Solitary pulmonary nodule: Secondary | ICD-10-CM | POA: Diagnosis present

## 2021-05-24 DIAGNOSIS — G8194 Hemiplegia, unspecified affecting left nondominant side: Secondary | ICD-10-CM | POA: Diagnosis present

## 2021-05-24 DIAGNOSIS — Q211 Atrial septal defect: Secondary | ICD-10-CM

## 2021-05-24 DIAGNOSIS — Z79899 Other long term (current) drug therapy: Secondary | ICD-10-CM

## 2021-05-24 DIAGNOSIS — F419 Anxiety disorder, unspecified: Secondary | ICD-10-CM | POA: Diagnosis present

## 2021-05-24 DIAGNOSIS — I129 Hypertensive chronic kidney disease with stage 1 through stage 4 chronic kidney disease, or unspecified chronic kidney disease: Secondary | ICD-10-CM | POA: Diagnosis present

## 2021-05-24 DIAGNOSIS — I776 Arteritis, unspecified: Secondary | ICD-10-CM | POA: Diagnosis present

## 2021-05-24 DIAGNOSIS — F32A Depression, unspecified: Secondary | ICD-10-CM | POA: Diagnosis present

## 2021-05-24 DIAGNOSIS — R6510 Systemic inflammatory response syndrome (SIRS) of non-infectious origin without acute organ dysfunction: Secondary | ICD-10-CM | POA: Diagnosis present

## 2021-05-24 DIAGNOSIS — R7989 Other specified abnormal findings of blood chemistry: Secondary | ICD-10-CM | POA: Diagnosis present

## 2021-05-24 DIAGNOSIS — I739 Peripheral vascular disease, unspecified: Secondary | ICD-10-CM | POA: Diagnosis present

## 2021-05-24 DIAGNOSIS — G936 Cerebral edema: Secondary | ICD-10-CM | POA: Diagnosis present

## 2021-05-24 DIAGNOSIS — K219 Gastro-esophageal reflux disease without esophagitis: Secondary | ICD-10-CM | POA: Diagnosis present

## 2021-05-24 DIAGNOSIS — G9341 Metabolic encephalopathy: Secondary | ICD-10-CM | POA: Diagnosis present

## 2021-05-24 DIAGNOSIS — E876 Hypokalemia: Secondary | ICD-10-CM | POA: Diagnosis present

## 2021-05-24 DIAGNOSIS — D631 Anemia in chronic kidney disease: Secondary | ICD-10-CM | POA: Diagnosis present

## 2021-05-24 DIAGNOSIS — K429 Umbilical hernia without obstruction or gangrene: Secondary | ICD-10-CM | POA: Diagnosis not present

## 2021-05-24 DIAGNOSIS — K5900 Constipation, unspecified: Secondary | ICD-10-CM | POA: Diagnosis not present

## 2021-05-24 DIAGNOSIS — K753 Granulomatous hepatitis, not elsewhere classified: Secondary | ICD-10-CM | POA: Diagnosis not present

## 2021-05-24 DIAGNOSIS — I10 Essential (primary) hypertension: Secondary | ICD-10-CM | POA: Diagnosis present

## 2021-05-24 DIAGNOSIS — N183 Chronic kidney disease, stage 3 unspecified: Secondary | ICD-10-CM | POA: Diagnosis present

## 2021-05-24 DIAGNOSIS — K7689 Other specified diseases of liver: Secondary | ICD-10-CM | POA: Diagnosis not present

## 2021-05-24 DIAGNOSIS — Z91013 Allergy to seafood: Secondary | ICD-10-CM

## 2021-05-24 DIAGNOSIS — T380X5A Adverse effect of glucocorticoids and synthetic analogues, initial encounter: Secondary | ICD-10-CM | POA: Diagnosis present

## 2021-05-24 DIAGNOSIS — Y838 Other surgical procedures as the cause of abnormal reaction of the patient, or of later complication, without mention of misadventure at the time of the procedure: Secondary | ICD-10-CM | POA: Diagnosis not present

## 2021-05-24 DIAGNOSIS — L7632 Postprocedural hematoma of skin and subcutaneous tissue following other procedure: Secondary | ICD-10-CM | POA: Diagnosis not present

## 2021-05-24 DIAGNOSIS — A419 Sepsis, unspecified organism: Secondary | ICD-10-CM | POA: Diagnosis present

## 2021-05-24 DIAGNOSIS — Z87440 Personal history of urinary (tract) infections: Secondary | ICD-10-CM

## 2021-05-24 DIAGNOSIS — Z888 Allergy status to other drugs, medicaments and biological substances status: Secondary | ICD-10-CM

## 2021-05-24 DIAGNOSIS — M545 Low back pain, unspecified: Secondary | ICD-10-CM | POA: Diagnosis present

## 2021-05-24 DIAGNOSIS — N182 Chronic kidney disease, stage 2 (mild): Secondary | ICD-10-CM | POA: Diagnosis present

## 2021-05-24 DIAGNOSIS — Z20822 Contact with and (suspected) exposure to covid-19: Secondary | ICD-10-CM | POA: Diagnosis present

## 2021-05-24 DIAGNOSIS — R32 Unspecified urinary incontinence: Secondary | ICD-10-CM | POA: Diagnosis present

## 2021-05-24 DIAGNOSIS — E871 Hypo-osmolality and hyponatremia: Secondary | ICD-10-CM | POA: Diagnosis not present

## 2021-05-24 DIAGNOSIS — I6340 Cerebral infarction due to embolism of unspecified cerebral artery: Secondary | ICD-10-CM | POA: Diagnosis present

## 2021-05-24 DIAGNOSIS — I6330 Cerebral infarction due to thrombosis of unspecified cerebral artery: Secondary | ICD-10-CM

## 2021-05-24 DIAGNOSIS — R200 Anesthesia of skin: Secondary | ICD-10-CM

## 2021-05-24 DIAGNOSIS — R29700 NIHSS score 0: Secondary | ICD-10-CM | POA: Diagnosis present

## 2021-05-24 LAB — COMPREHENSIVE METABOLIC PANEL
ALT: 16 U/L (ref 0–44)
AST: 24 U/L (ref 15–41)
Albumin: 3.4 g/dL — ABNORMAL LOW (ref 3.5–5.0)
Alkaline Phosphatase: 75 U/L (ref 38–126)
Anion gap: 12 (ref 5–15)
BUN: 9 mg/dL (ref 8–23)
CO2: 22 mmol/L (ref 22–32)
Calcium: 9.3 mg/dL (ref 8.9–10.3)
Chloride: 105 mmol/L (ref 98–111)
Creatinine, Ser: 0.77 mg/dL (ref 0.44–1.00)
GFR, Estimated: >60 mL/min (ref >60)
Glucose, Bld: 121 mg/dL — ABNORMAL HIGH (ref 70–99)
Potassium: 2.8 mmol/L — ABNORMAL LOW (ref 3.5–5.1)
Sodium: 139 mmol/L (ref 135–145)
Total Bilirubin: 0.5 mg/dL (ref 0.3–1.2)
Total Protein: 7.7 g/dL (ref 6.5–8.1)

## 2021-05-24 LAB — RESP PANEL BY RT-PCR (FLU A&B, COVID) ARPGX2
Influenza A by PCR: NEGATIVE
Influenza B by PCR: NEGATIVE
SARS Coronavirus 2 by RT PCR: NEGATIVE

## 2021-05-24 LAB — URINALYSIS, ROUTINE W REFLEX MICROSCOPIC
Bacteria, UA: NONE SEEN
Bilirubin Urine: NEGATIVE
Glucose, UA: NEGATIVE mg/dL
Ketones, ur: NEGATIVE mg/dL
Leukocytes,Ua: NEGATIVE
Nitrite: NEGATIVE
Protein, ur: 30 mg/dL — AB
Specific Gravity, Urine: 1.01 (ref 1.005–1.030)
pH: 6 (ref 5.0–8.0)

## 2021-05-24 LAB — CBC
HCT: 35.8 % — ABNORMAL LOW (ref 36.0–46.0)
Hemoglobin: 11.6 g/dL — ABNORMAL LOW (ref 12.0–15.0)
MCH: 29.8 pg (ref 26.0–34.0)
MCHC: 32.4 g/dL (ref 30.0–36.0)
MCV: 92 fL (ref 80.0–100.0)
Platelets: 243 10*3/uL (ref 150–400)
RBC: 3.89 MIL/uL (ref 3.87–5.11)
RDW: 15.3 % (ref 11.5–15.5)
WBC: 8.6 10*3/uL (ref 4.0–10.5)
nRBC: 0 % (ref 0.0–0.2)

## 2021-05-24 LAB — LACTIC ACID, PLASMA
Lactic Acid, Venous: 2.1 mmol/L (ref 0.5–1.9)
Lactic Acid, Venous: 1.5 mmol/L (ref 0.5–1.9)

## 2021-05-24 LAB — PROTIME-INR
INR: 1.1 (ref 0.8–1.2)
Prothrombin Time: 14.3 seconds (ref 11.4–15.2)

## 2021-05-24 LAB — APTT: aPTT: 32 seconds (ref 24–36)

## 2021-05-24 LAB — MAGNESIUM: Magnesium: 2.3 mg/dL (ref 1.7–2.4)

## 2021-05-24 LAB — PROCALCITONIN: Procalcitonin: <0.1 ng/mL

## 2021-05-24 LAB — D-DIMER, QUANTITATIVE: D-Dimer, Quant: 2.51 ug/mL-FEU — ABNORMAL HIGH (ref 0.00–0.50)

## 2021-05-24 IMAGING — CT CT ANGIO CHEST
2 of 6 series · 19 of 46 positions shown · IV contrast (omnipaque)
Comparison: None.

CLINICAL DATA: Elevated D-dimer, tachycardia

EXAM:
CT ANGIOGRAPHY CHEST WITH CONTRAST
TECHNIQUE: Multidetector CT imaging of the chest was performed using the
standard protocol during bolus administration of intravenous
contrast. Multiplanar CT image reconstructions and MIPs were
obtained to evaluate the vascular anatomy.
CONTRAST:  75mL OMNIPAQUE IOHEXOL 350 MG/ML SOLN

[Series 6: thins · axial · 0.71mm/px · z∈[+1273,+1553]mm · 16 of 308 slices shown]
[im 14/308  lung]
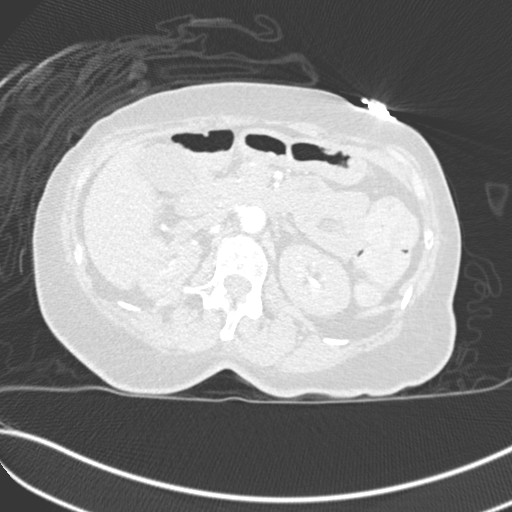
[im 41/308  soft-tissue]
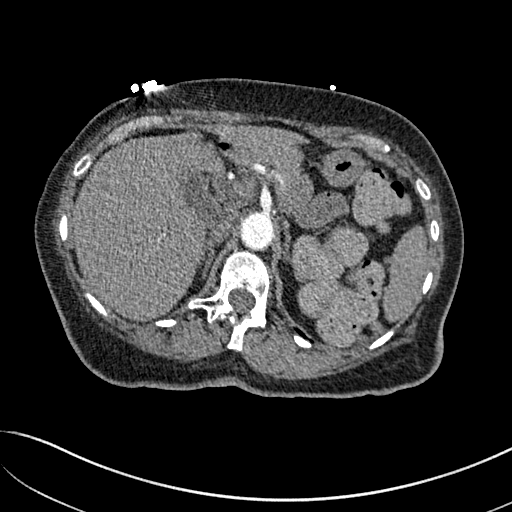
[im 54/308  lung]
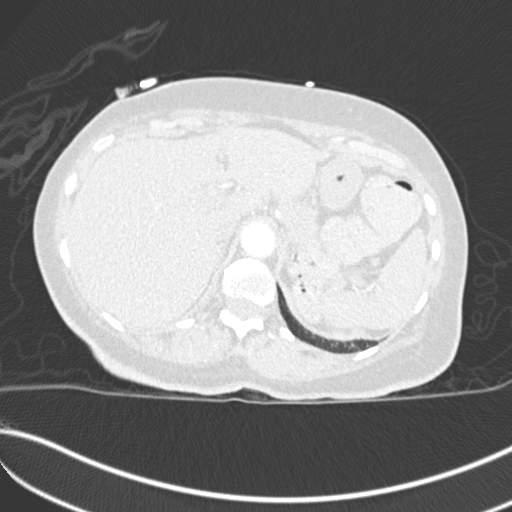
[im 67/308  soft-tissue]
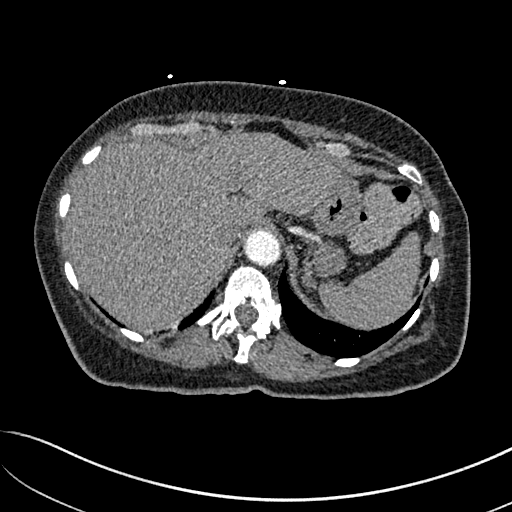
[im 94/308  lung]
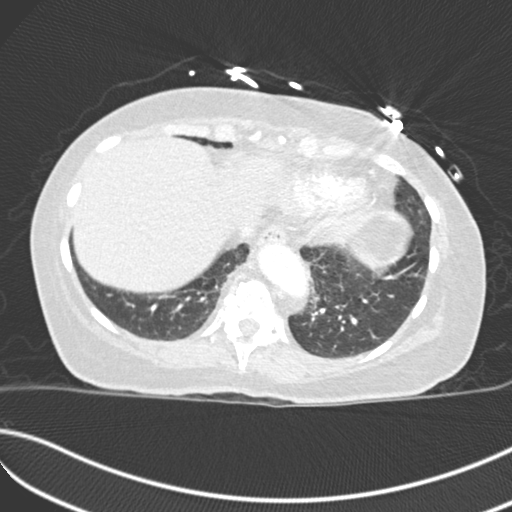
[im 107/308  soft-tissue]
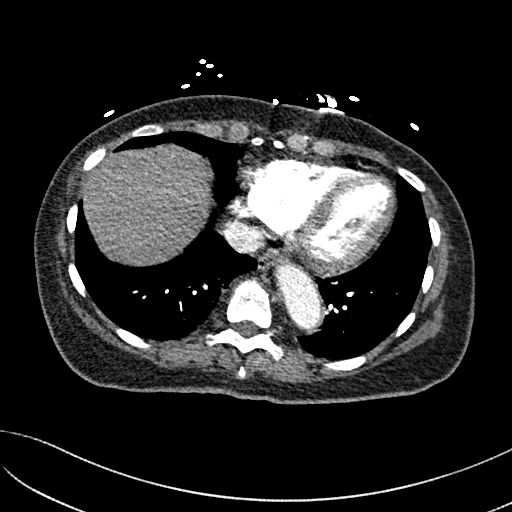
[im 121/308  lung]
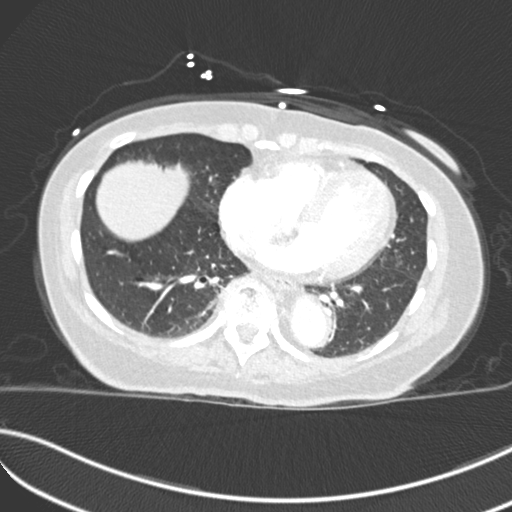
[im 147/308  soft-tissue]
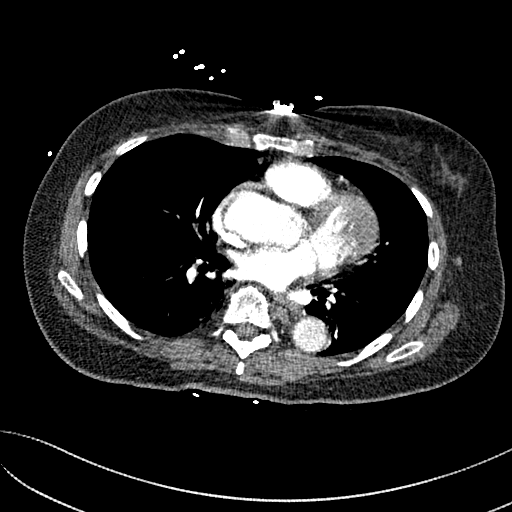
[im 161/308  lung]
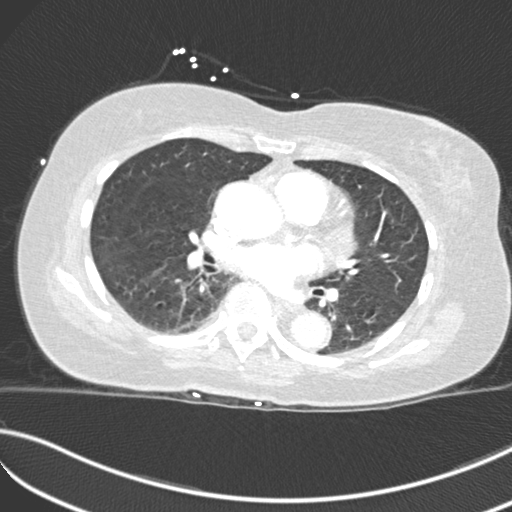
[im 187/308  soft-tissue]
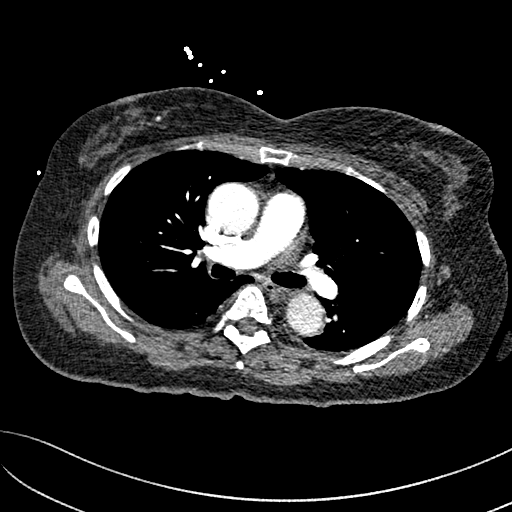
[im 201/308  lung]
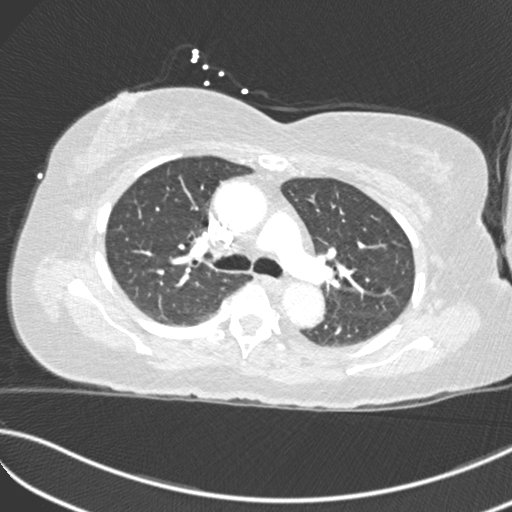
[im 214/308  soft-tissue]
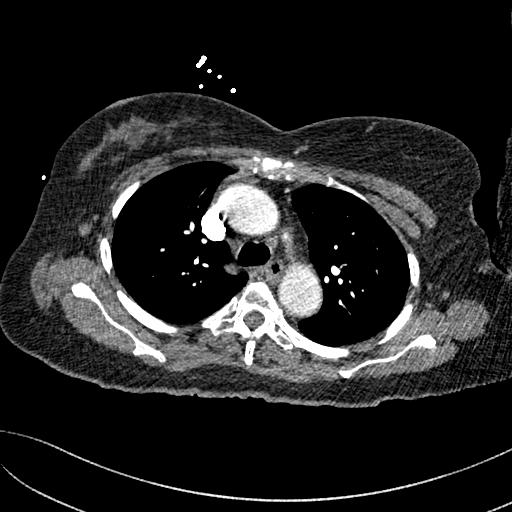
[im 241/308  lung]
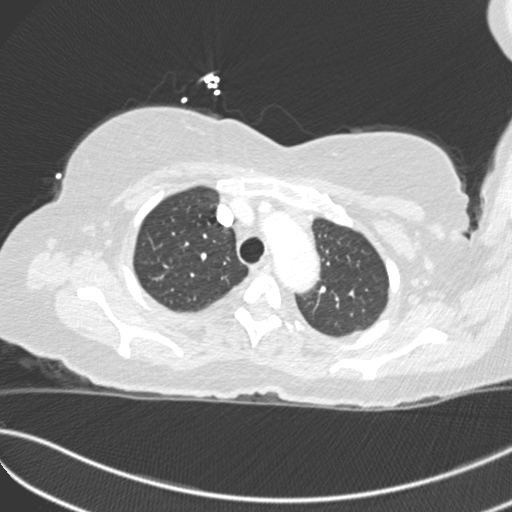
[im 254/308  soft-tissue]
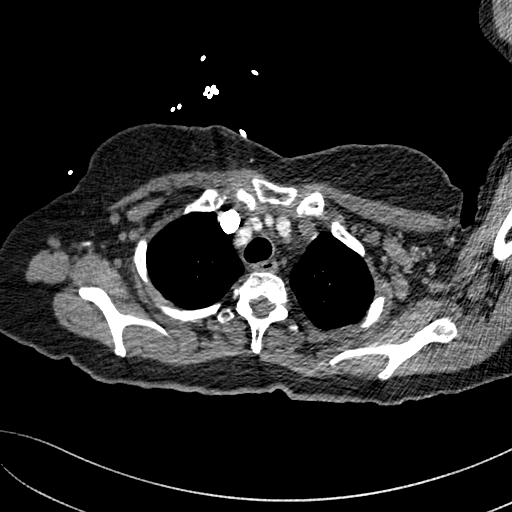
[im 267/308  lung]
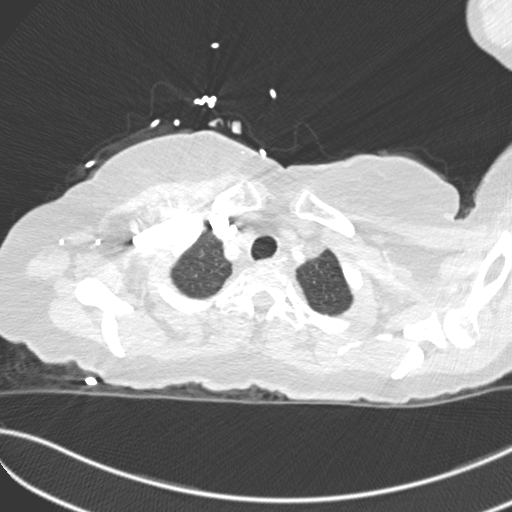
[im 294/308  soft-tissue]
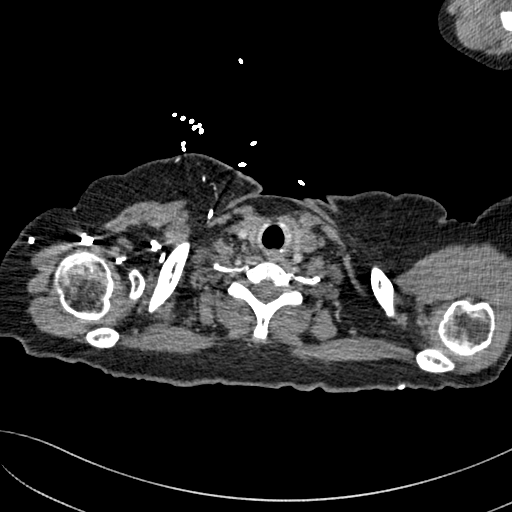

[Series 8: coronal mpr · coronal · 0.61mm/px · 3 of 150 slices shown]
[im 30/150  soft-tissue]
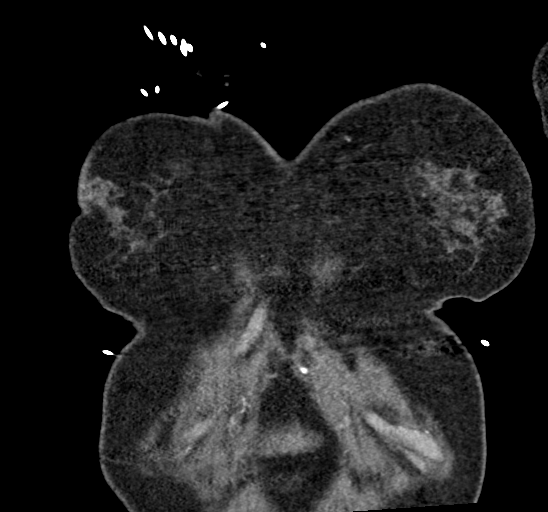
[im 60/150  soft-tissue]
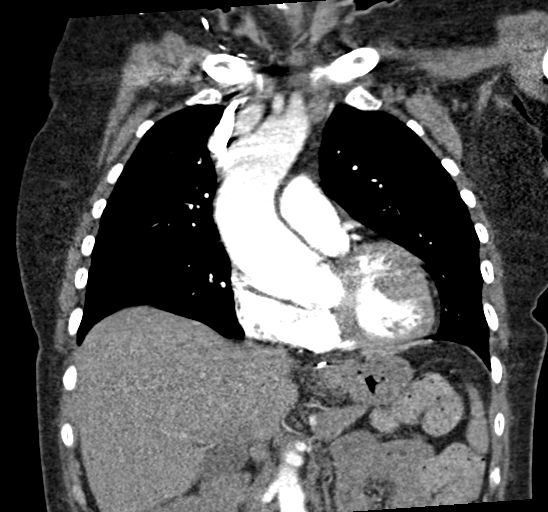
[im 90/150  soft-tissue]
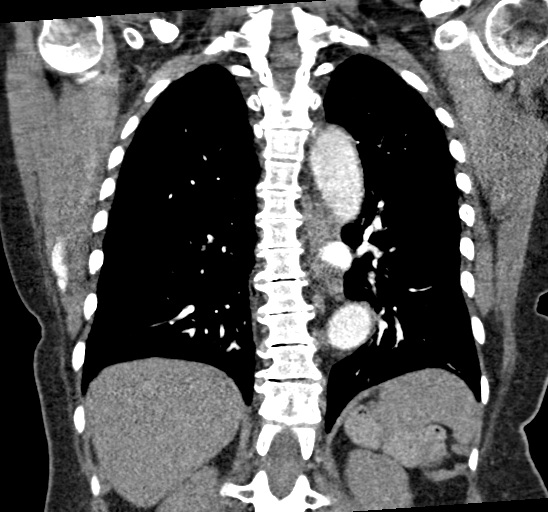

[19 of 46 positions shown; findings below may reference images not displayed]

FINDINGS: Cardiovascular: Normal heart size. No pericardial effusion. Thoracic
aorta is normal in caliber minor calcified plaque. Satisfactory
opacification of the pulmonary arteries to the proximal segmental
level. No evidence of acute pulmonary embolism.

Mediastinum/Nodes: No enlarged mediastinal or hilar lymph nodes.
Mildly prominent but nonenlarged axillary and subpectoral lymph
nodes.

Lungs/Pleura: No pleural effusion or pneumothorax. There is a 4 mm
peripheral nodular opacity of the right middle lobe (series 7, image
58).

Upper Abdomen: No acute abnormality. Postoperative changes at the
gastroesophageal junction.

Musculoskeletal: Degenerative changes of the spine. No acute osseous
abnormality.

Review of the MIP images confirms the above findings.
IMPRESSION: No evidence of acute pulmonary embolism or other acute abnormality.

4 mm right middle lobe peripheral nodular opacity. No follow-up
needed if patient is low-risk. Non-contrast chest CT can be
considered in 12 months if patient is high-risk. This recommendation
follows the consensus statement: Guidelines for Management of
Incidental Pulmonary Nodules Detected on CT Images: From the

## 2021-05-24 IMAGING — CR DG CHEST 2V
2 series · 2 of 2 positions shown · non-contrast
Comparison: [DATE]

CLINICAL DATA: Fever and urinary frequency

EXAM:
CHEST - 2 VIEW

[chest lat]
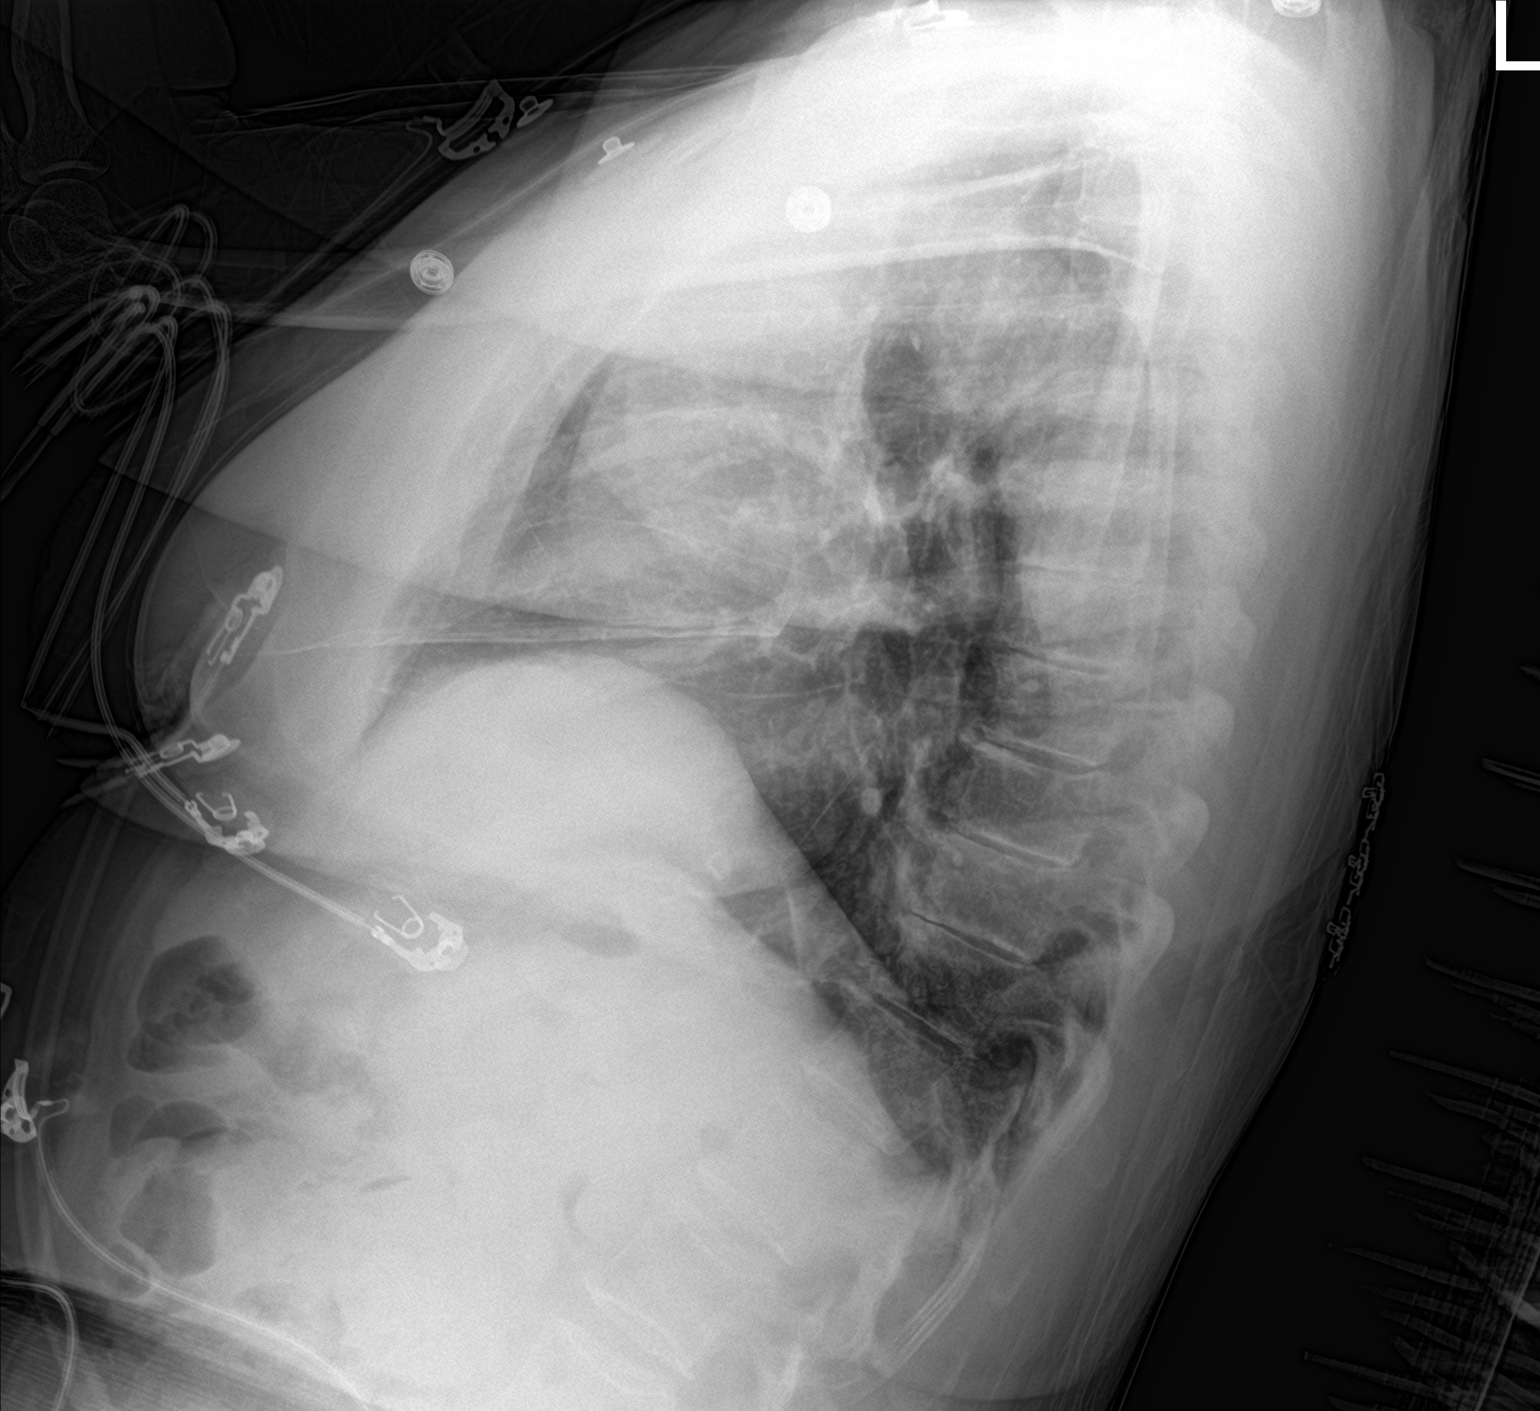

[chest ap]
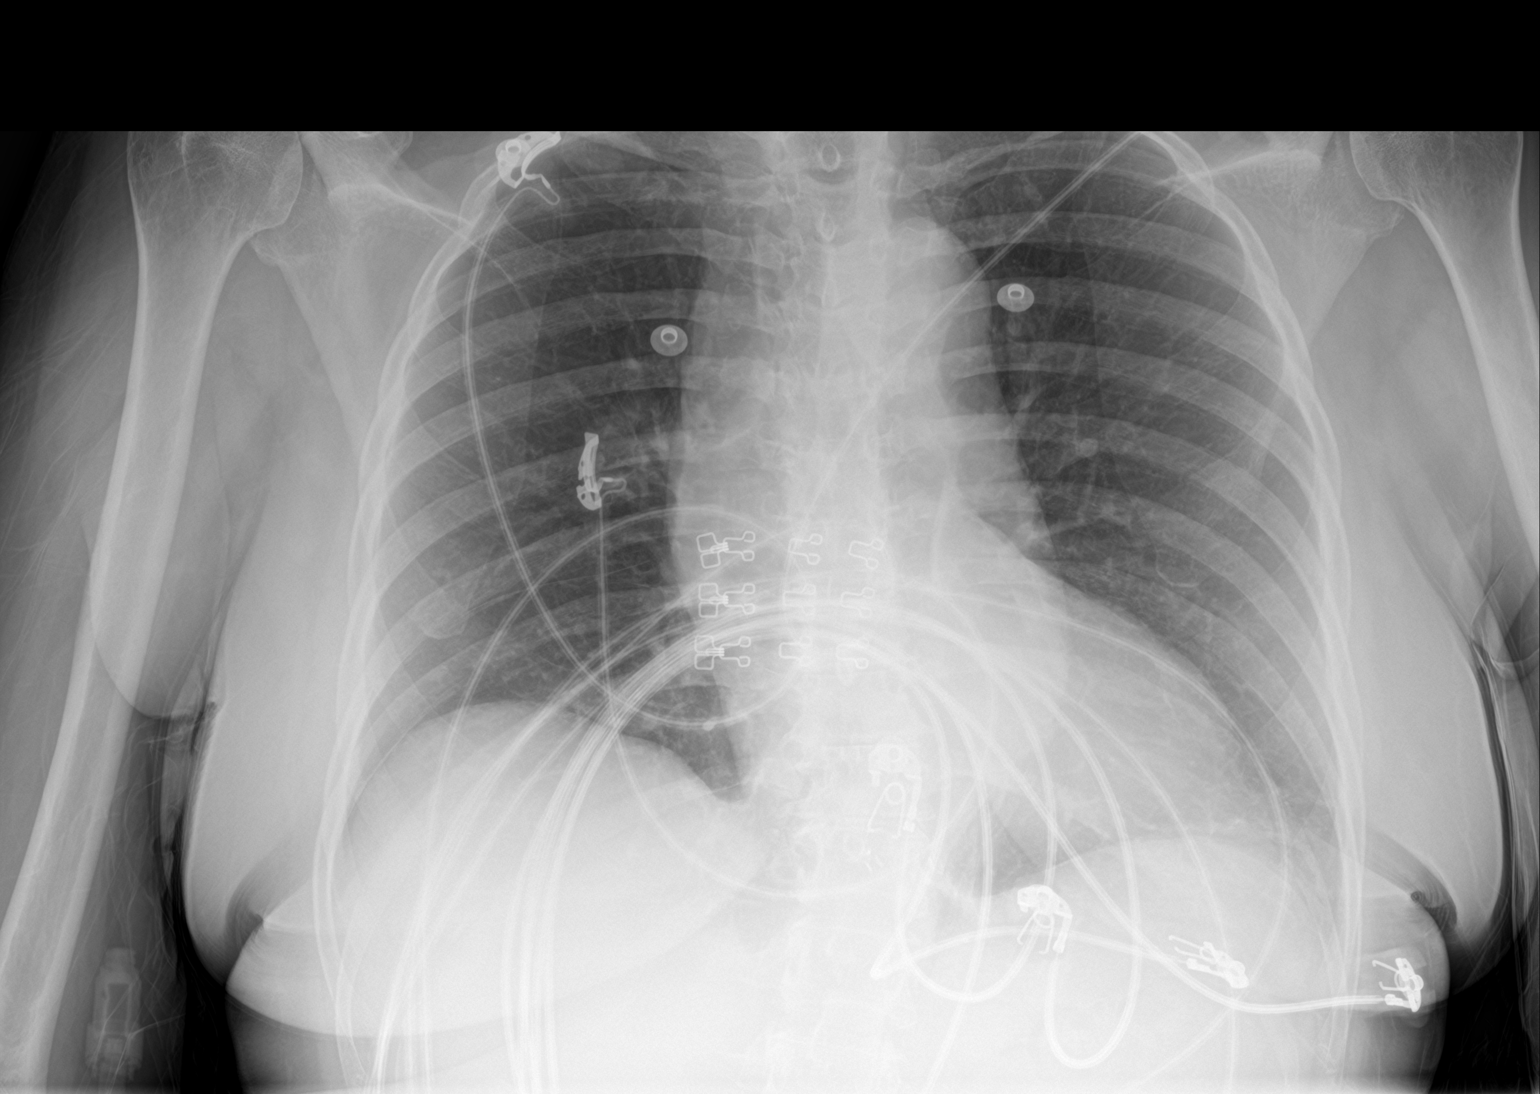

[2 of 2 positions shown; findings below may reference images not displayed]

FINDINGS: The heart size and mediastinal contours are within normal limits.
Both lungs are clear. The visualized skeletal structures are
unremarkable.
IMPRESSION: No active cardiopulmonary disease.

## 2021-05-24 IMAGING — CT CT RENAL STONE PROTOCOL
2 of 4 series · 16 of 46 positions shown, 18 images · non-contrast
Comparison: None available

CLINICAL DATA: Flank pain, urinary frequency

EXAM:
CT ABDOMEN AND PELVIS WITHOUT CONTRAST
TECHNIQUE: Multidetector CT imaging of the abdomen and pelvis was performed
following the standard protocol without IV contrast.

[Series 3: stone study 5.0 i30f 2 · axial · 0.80mm/px · z∈[+742,+1128]mm · 13 of 85 slices shown, 15 images]
[im 4/85  soft-tissue]
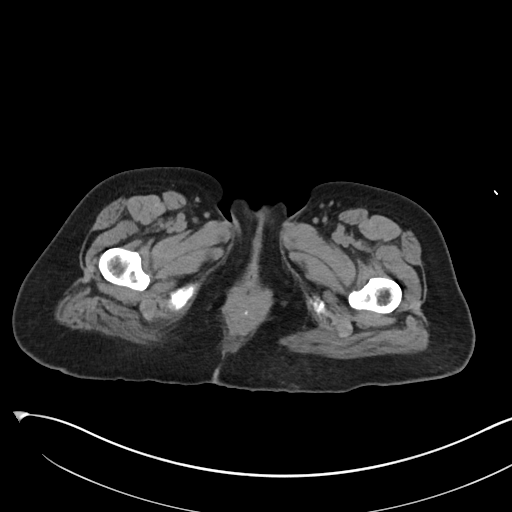
[im 4/85  bone]
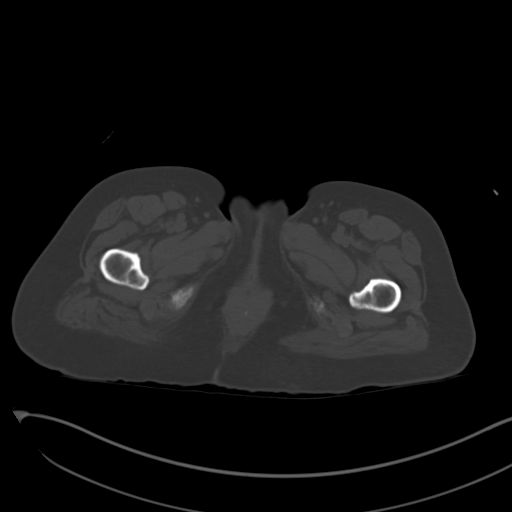
[im 11/85  soft-tissue]
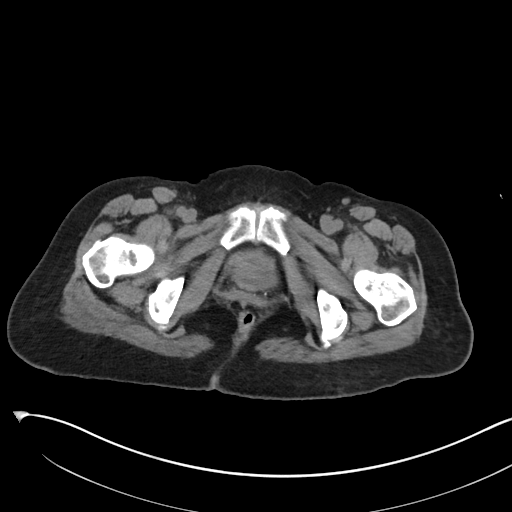
[im 18/85  soft-tissue]
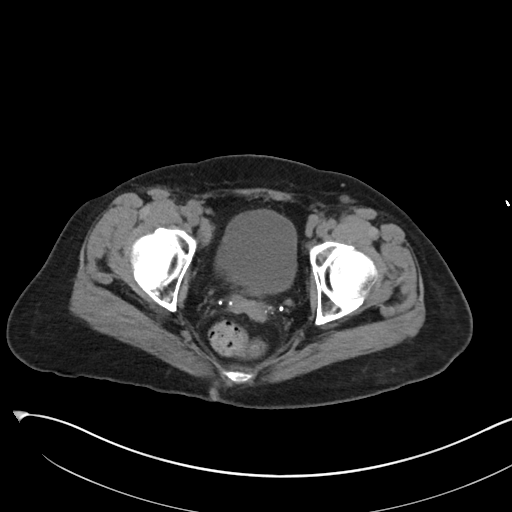
[im 25/85  soft-tissue]
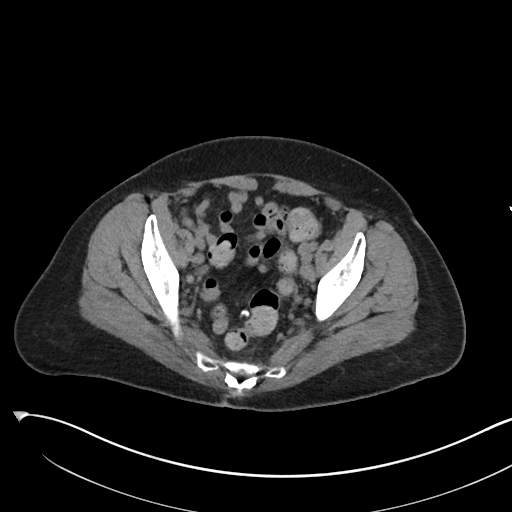
[im 29/85  soft-tissue]
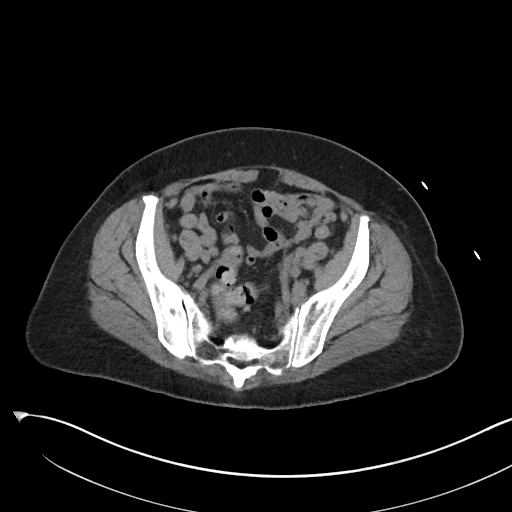
[im 36/85  soft-tissue]
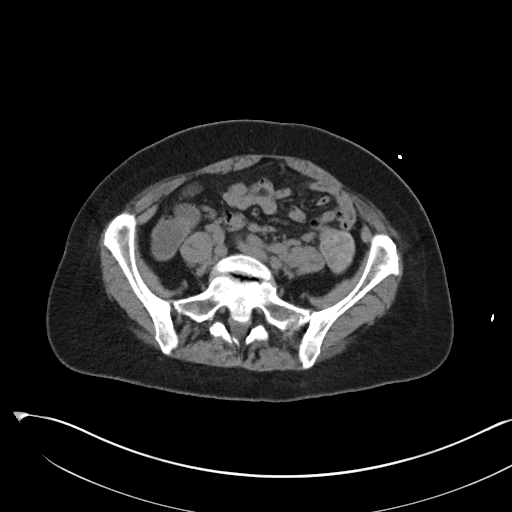
[im 43/85  soft-tissue]
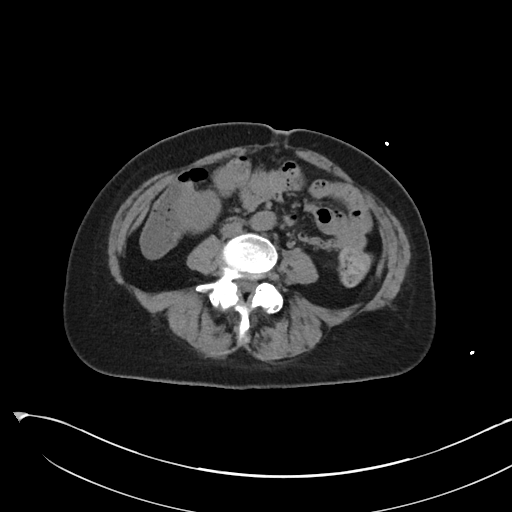
[im 50/85  soft-tissue]
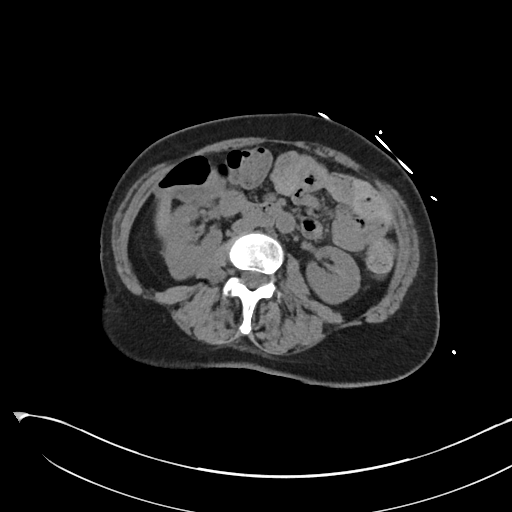
[im 57/85  soft-tissue]
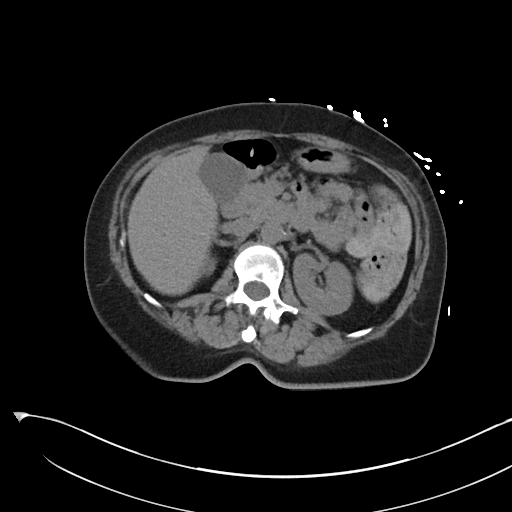
[im 57/85  bone]
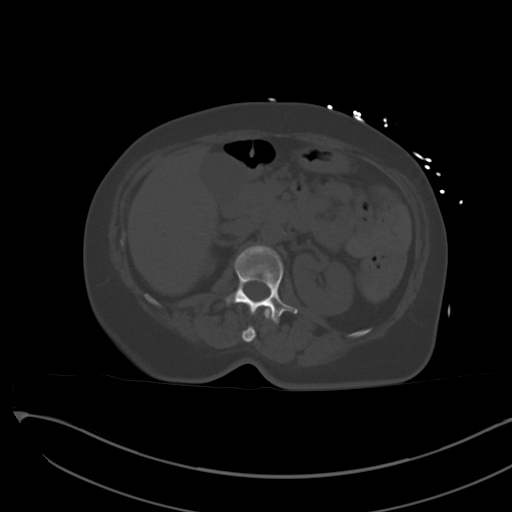
[im 60/85  soft-tissue]
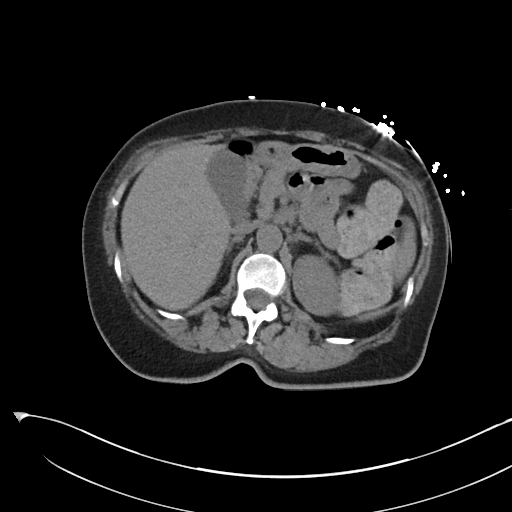
[im 67/85  soft-tissue]
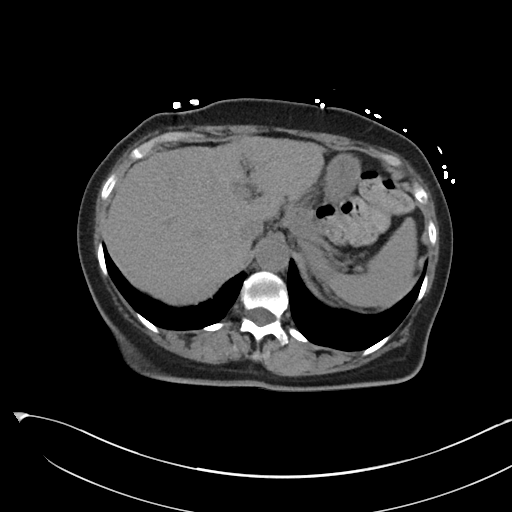
[im 74/85  soft-tissue]
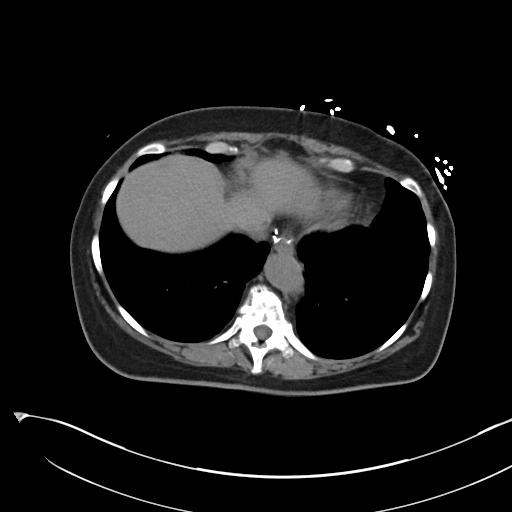
[im 81/85  soft-tissue]
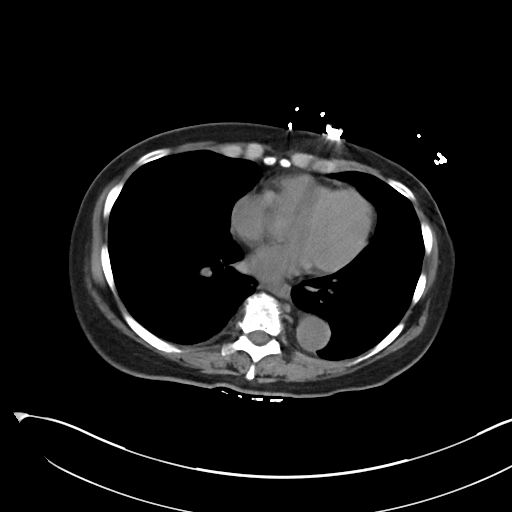

[Series 6: coronal soft tissue · coronal · 0.72mm/px · 3 of 82 slices shown]
[im 28/82  soft-tissue]
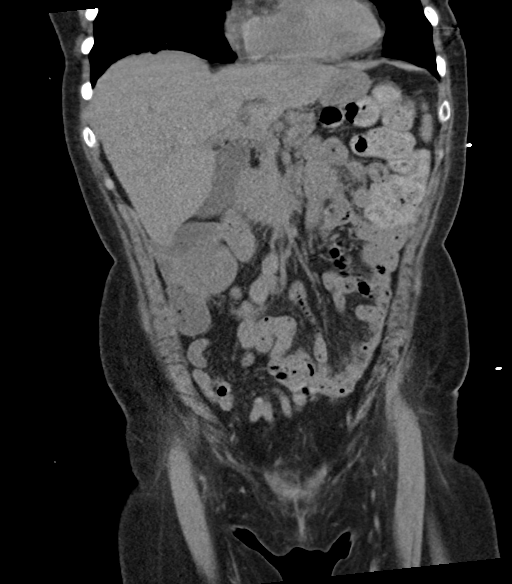
[im 37/82  soft-tissue]
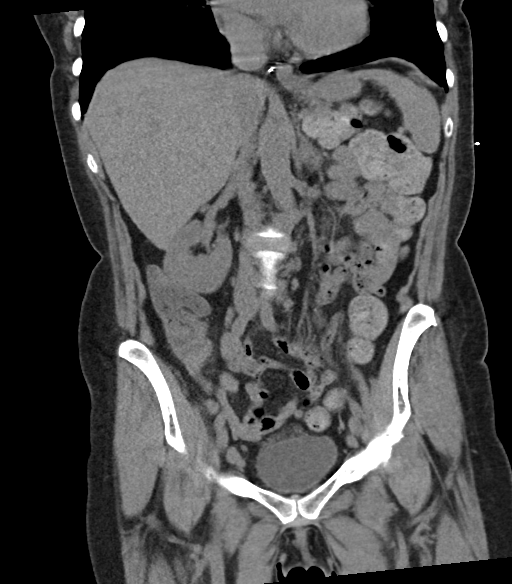
[im 46/82  soft-tissue]
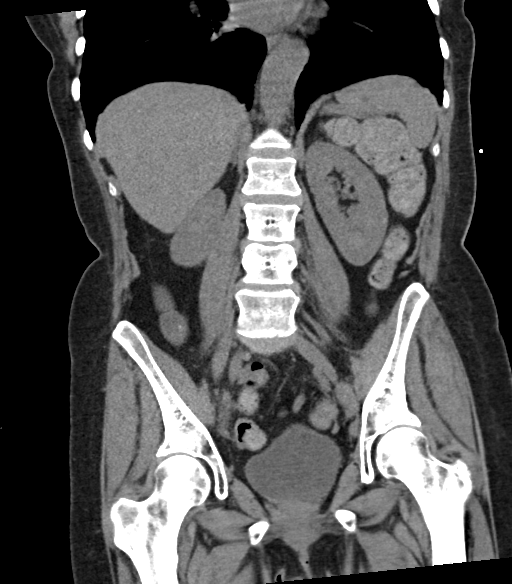

[16 of 46 positions shown; findings below may reference images not displayed]

FINDINGS: Lower chest: No acute abnormality.

Hepatobiliary: Limited without IV contrast. Punctate hepatic dome
calcified granuloma anteriorly. No large focal hepatic abnormality
intrahepatic biliary dilatation. Gallbladder unremarkable. Common
bile duct nondilated.

Pancreas: Unremarkable. No pancreatic ductal dilatation or
surrounding inflammatory changes.

Spleen: Normal in size without focal abnormality.

Adrenals/Urinary Tract: Adrenal glands are unremarkable. Kidneys are
normal, without renal calculi, focal lesion, or hydronephrosis.
Bladder is unremarkable.

Stomach/Bowel: Negative for bowel obstruction, significant
dilatation, ileus, or free air. Normal appendix demonstrated.

No free fluid, fluid collection, hemorrhage, hematoma abscess,
ascites.

Vascular/Lymphatic: Minor aortic atherosclerosis and tortuosity.
Negative for aneurysm. Limited assessment without IV contrast.

Mild bilateral external iliac adenopathy, nonspecific. Left external
iliac has a short axis diameter 1.3 cm. No inguinal adenopathy. No
significant retroperitoneal adenopathy.

Reproductive: Remote hysterectomy. Calcifications noted in the
pelvis appearing benign or postoperative. No adnexal abnormality. No
pelvic free fluid.

Other: Small fat containing umbilical hernia. No large ventral
hernia. No inguinal hernia. Negative for ascites.

Musculoskeletal: Thoracolumbar degenerative changes. Lower lumbar
facet arthropathy. No acute osseous finding.
IMPRESSION: No acute obstructing urinary tract or ureteral calculus. No
obstructive uropathy or hydronephrosis.

No other acute intra-abdominal or pelvic finding by noncontrast CT.

Nonspecific bilateral mild external iliac adenopathy, short axis
diameters measure up to 1.3 cm on the left.

Aortic Atherosclerosis ([Y9]-[Y9]).

## 2021-05-24 MED ORDER — POTASSIUM CHLORIDE CRYS ER 20 MEQ PO TBCR
20.0000 meq | EXTENDED_RELEASE_TABLET | Freq: Once | ORAL | Status: AC
  Administered 2021-05-24: 20 meq via ORAL
  Filled 2021-05-24: qty 1

## 2021-05-24 MED ORDER — ENSURE ENLIVE PO LIQD
237.0000 mL | Freq: Two times a day (BID) | ORAL | Status: DC
  Administered 2021-05-27 – 2021-06-07 (×13): 237 mL via ORAL

## 2021-05-24 MED ORDER — HYDROCODONE-ACETAMINOPHEN 7.5-325 MG PO TABS
1.0000 | ORAL_TABLET | Freq: Two times a day (BID) | ORAL | Status: DC | PRN
  Administered 2021-05-25: 1 via ORAL
  Filled 2021-05-24: qty 1

## 2021-05-24 MED ORDER — HYDRALAZINE HCL 25 MG PO TABS
25.0000 mg | ORAL_TABLET | Freq: Two times a day (BID) | ORAL | Status: DC
  Administered 2021-05-24 – 2021-05-26 (×4): 25 mg via ORAL
  Filled 2021-05-24 (×4): qty 1

## 2021-05-24 MED ORDER — IOHEXOL 350 MG/ML SOLN
75.0000 mL | Freq: Once | INTRAVENOUS | Status: AC | PRN
  Administered 2021-05-24: 75 mL via INTRAVENOUS

## 2021-05-24 MED ORDER — METOPROLOL SUCCINATE ER 100 MG PO TB24
100.0000 mg | ORAL_TABLET | Freq: Every day | ORAL | Status: DC
  Administered 2021-05-24 – 2021-06-08 (×16): 100 mg via ORAL
  Filled 2021-05-24 (×17): qty 1

## 2021-05-24 MED ORDER — SODIUM CHLORIDE 0.9 % IV BOLUS: 1000.0000 mL | Freq: Once | INTRAVENOUS | Status: DC

## 2021-05-24 MED ORDER — SUMATRIPTAN SUCCINATE 100 MG PO TABS
100.0000 mg | ORAL_TABLET | ORAL | Status: AC | PRN
  Administered 2021-05-25 (×2): 100 mg via ORAL
  Filled 2021-05-24 (×3): qty 1

## 2021-05-24 MED ORDER — GENTAMICIN SULFATE 40 MG/ML IJ SOLN
5.0000 mg/kg | Freq: Once | INTRAVENOUS | Status: AC
  Administered 2021-05-24: 300 mg via INTRAVENOUS
  Filled 2021-05-24: qty 7.5

## 2021-05-24 MED ORDER — LACTATED RINGERS IV BOLUS
1000.0000 mL | Freq: Once | INTRAVENOUS | Status: AC
  Administered 2021-05-24: 1000 mL via INTRAVENOUS

## 2021-05-24 MED ORDER — SODIUM CHLORIDE 0.9 % IV SOLN
2.0000 g | Freq: Once | INTRAVENOUS | Status: AC
  Administered 2021-05-24: 2 g via INTRAVENOUS
  Filled 2021-05-24: qty 2

## 2021-05-24 MED ORDER — LACTATED RINGERS IV SOLN: INTRAVENOUS | Status: DC

## 2021-05-24 MED ORDER — SODIUM CHLORIDE 0.9 % IV SOLN
2.0000 g | Freq: Three times a day (TID) | INTRAVENOUS | Status: AC
  Administered 2021-05-25 – 2021-05-28 (×11): 2 g via INTRAVENOUS
  Filled 2021-05-24 (×12): qty 2

## 2021-05-24 MED ORDER — METRONIDAZOLE 500 MG/100ML IV SOLN
500.0000 mg | Freq: Three times a day (TID) | INTRAVENOUS | Status: DC
  Administered 2021-05-24 – 2021-05-26 (×6): 500 mg via INTRAVENOUS
  Filled 2021-05-24 (×7): qty 100

## 2021-05-24 MED ORDER — AZTREONAM 2 G IJ SOLR
2.0000 g | Freq: Three times a day (TID) | INTRAMUSCULAR | Status: DC
  Filled 2021-05-24: qty 2

## 2021-05-24 MED ORDER — TOPIRAMATE 25 MG PO TABS
150.0000 mg | ORAL_TABLET | Freq: Every day | ORAL | Status: DC
  Administered 2021-05-24 – 2021-06-07 (×15): 150 mg via ORAL
  Filled 2021-05-24 (×16): qty 2
  Filled 2021-05-24: qty 6

## 2021-05-24 MED ORDER — AMLODIPINE BESYLATE 5 MG PO TABS
5.0000 mg | ORAL_TABLET | Freq: Every morning | ORAL | Status: DC
  Administered 2021-05-25 – 2021-06-08 (×14): 5 mg via ORAL
  Filled 2021-05-24 (×14): qty 1

## 2021-05-24 MED ORDER — ACETAMINOPHEN 325 MG PO TABS
650.0000 mg | ORAL_TABLET | Freq: Four times a day (QID) | ORAL | Status: DC | PRN
  Administered 2021-05-24 – 2021-06-07 (×5): 650 mg via ORAL
  Filled 2021-05-24 (×6): qty 2

## 2021-05-24 MED ORDER — POTASSIUM CHLORIDE 10 MEQ/100ML IV SOLN
10.0000 meq | INTRAVENOUS | Status: AC
  Administered 2021-05-24 (×2): 10 meq via INTRAVENOUS
  Filled 2021-05-24 (×2): qty 100

## 2021-05-24 MED ORDER — ACETAMINOPHEN 325 MG PO TABS
650.0000 mg | ORAL_TABLET | Freq: Once | ORAL | Status: AC
  Administered 2021-05-24: 650 mg via ORAL
  Filled 2021-05-24: qty 2

## 2021-05-24 MED ORDER — VENLAFAXINE HCL ER 75 MG PO CP24
75.0000 mg | ORAL_CAPSULE | Freq: Every day | ORAL | Status: DC
  Administered 2021-05-25 – 2021-06-08 (×14): 75 mg via ORAL
  Filled 2021-05-24 (×16): qty 1

## 2021-05-24 MED ORDER — LORAZEPAM 1 MG PO TABS
1.0000 mg | ORAL_TABLET | Freq: Two times a day (BID) | ORAL | Status: DC | PRN
  Administered 2021-05-26 – 2021-06-03 (×9): 1 mg via ORAL
  Filled 2021-05-24 (×10): qty 1

## 2021-05-24 MED ORDER — ZOLPIDEM TARTRATE 5 MG PO TABS
5.0000 mg | ORAL_TABLET | Freq: Every evening | ORAL | Status: DC | PRN
  Administered 2021-05-24 – 2021-06-05 (×13): 5 mg via ORAL
  Filled 2021-05-24 (×13): qty 1

## 2021-05-24 MED ORDER — TIZANIDINE HCL 4 MG PO TABS
4.0000 mg | ORAL_TABLET | Freq: Two times a day (BID) | ORAL | Status: DC | PRN
  Administered 2021-06-06: 4 mg via ORAL
  Filled 2021-05-24 (×2): qty 1

## 2021-05-24 MED ORDER — ONDANSETRON HCL 4 MG PO TABS: 4.0000 mg | ORAL_TABLET | Freq: Four times a day (QID) | ORAL | Status: DC | PRN

## 2021-05-24 MED ORDER — BUPROPION HCL ER (XL) 150 MG PO TB24
300.0000 mg | ORAL_TABLET | Freq: Every day | ORAL | Status: DC
  Administered 2021-05-24 – 2021-06-08 (×16): 300 mg via ORAL
  Filled 2021-05-24 (×10): qty 2
  Filled 2021-05-24: qty 1
  Filled 2021-05-24 (×6): qty 2

## 2021-05-24 MED ORDER — VANCOMYCIN HCL 1000 MG/200ML IV SOLN
1000.0000 mg | INTRAVENOUS | Status: DC
  Administered 2021-05-25: 1000 mg via INTRAVENOUS
  Filled 2021-05-24 (×2): qty 200

## 2021-05-24 MED ORDER — ENOXAPARIN SODIUM 40 MG/0.4ML IJ SOSY
40.0000 mg | PREFILLED_SYRINGE | INTRAMUSCULAR | Status: DC
  Administered 2021-05-24 – 2021-05-26 (×3): 40 mg via SUBCUTANEOUS
  Filled 2021-05-24 (×3): qty 0.4

## 2021-05-24 MED ORDER — VANCOMYCIN HCL 1250 MG/250ML IV SOLN
1250.0000 mg | Freq: Once | INTRAVENOUS | Status: AC
  Administered 2021-05-24: 1250 mg via INTRAVENOUS
  Filled 2021-05-24: qty 250

## 2021-05-24 MED ORDER — ACETAMINOPHEN 650 MG RE SUPP: 650.0000 mg | Freq: Four times a day (QID) | RECTAL | Status: DC | PRN

## 2021-05-24 NOTE — ED Notes (Signed)
Patient transported to CT 

## 2021-05-24 NOTE — Progress Notes (Signed)
   05/24/21 2200  Assess: MEWS Score  Temp 99.5 F (37.5 C)  BP (!) 154/92  Pulse Rate (!) 112  ECG Heart Rate (!) 116  Resp 18  Level of Consciousness Alert  Assess: MEWS Score  MEWS Temp 0  MEWS Systolic 0  MEWS Pulse 2  MEWS RR 0  MEWS LOC 0  MEWS Score 2  MEWS Score Color Yellow  Assess: if the MEWS score is Yellow or Red  Were vital signs taken at a resting state? Yes  Focused Assessment No change from prior assessment  Early Detection of Sepsis Score *See Row Information* Medium  MEWS guidelines implemented *See Row Information* Yes  Treat  MEWS Interventions Administered scheduled meds/treatments  Pain Scale 0-10  Pain Score 0  Notify: Charge Nurse/RN  Name of Charge Nurse/RN Notified Christina RN  Date Charge Nurse/RN Notified 05/24/21  Time Charge Nurse/RN Notified 2210

## 2021-05-24 NOTE — ED Notes (Signed)
Attempted to call report to 6E. Unable to take report at this time. Will call back.

## 2021-05-24 NOTE — ED Notes (Signed)
Blue top redrawn and sent to lab.

## 2021-05-24 NOTE — ED Notes (Signed)
Pharmacy preparing ABX.

## 2021-05-24 NOTE — ED Provider Notes (Signed)
Care assumed at shift change from [facility], PA-C.patient presentation consistent with sepsis unknown source, initially thought to be urinary source with some left flank pain and frequency. U/A however is negative as well as stone study. Found to be intermittent borderline hypoxia, and tachycardia. D-dimer elevated, CTA ordered.  Patient will likely need admission if CTA negative.  Physical Exam  BP (!) 132/109   Pulse (!) 109   Temp 98.8 F (37.1 C) (Oral)   Resp (!) 29   Ht 5\' 3"  (1.6 m)   Wt 60.8 kg   SpO2 100%   BMI 23.74 kg/m   Physical Exam Vitals and nursing note reviewed.  Constitutional:      Appearance: She is well-developed. She is ill-appearing.     Comments: Pt has rigors on exam  HENT:     Head: Normocephalic and atraumatic.  Eyes:     Conjunctiva/sclera: Conjunctivae normal.  Cardiovascular:     Rate and Rhythm: Tachycardia present.  Pulmonary:     Effort: Pulmonary effort is normal.  Abdominal:     Palpations: Abdomen is soft.  Skin:    General: Skin is warm.  Neurological:     Mental Status: She is alert.  Psychiatric:        Behavior: Behavior normal.   Results for orders placed or performed during the hospital encounter of 05/24/21  Resp Panel by RT-PCR (Flu A&B, Covid) Nasopharyngeal Swab   Specimen: Nasopharyngeal Swab; Nasopharyngeal(NP) swabs in vial transport medium  Result Value Ref Range   SARS Coronavirus 2 by RT PCR NEGATIVE NEGATIVE   Influenza A by PCR NEGATIVE NEGATIVE   Influenza B by PCR NEGATIVE NEGATIVE  CBC  Result Value Ref Range   WBC 8.6 4.0 - 10.5 K/uL   RBC 3.89 3.87 - 5.11 MIL/uL   Hemoglobin 11.6 (L) 12.0 - 15.0 g/dL   HCT 35.8 (L) 36.0 - 46.0 %   MCV 92.0 80.0 - 100.0 fL   MCH 29.8 26.0 - 34.0 pg   MCHC 32.4 30.0 - 36.0 g/dL   RDW 15.3 11.5 - 15.5 %   Platelets 243 150 - 400 K/uL   nRBC 0.0 0.0 - 0.2 %  Comprehensive metabolic panel  Result Value Ref Range   Sodium 139 135 - 145 mmol/L   Potassium 2.8 (L)  3.5 - 5.1 mmol/L   Chloride 105 98 - 111 mmol/L   CO2 22 22 - 32 mmol/L   Glucose, Bld 121 (H) 70 - 99 mg/dL   BUN 9 8 - 23 mg/dL   Creatinine, Ser 0.77 0.44 - 1.00 mg/dL   Calcium 9.3 8.9 - 10.3 mg/dL   Total Protein 7.7 6.5 - 8.1 g/dL   Albumin 3.4 (L) 3.5 - 5.0 g/dL   AST 24 15 - 41 U/L   ALT 16 0 - 44 U/L   Alkaline Phosphatase 75 38 - 126 U/L   Total Bilirubin 0.5 0.3 - 1.2 mg/dL   GFR, Estimated >60 >60 mL/min   Anion gap 12 5 - 15  Urinalysis, Routine w reflex microscopic Urine, Clean Catch  Result Value Ref Range   Color, Urine YELLOW YELLOW   APPearance CLEAR CLEAR   Specific Gravity, Urine 1.010 1.005 - 1.030   pH 6.0 5.0 - 8.0   Glucose, UA NEGATIVE NEGATIVE mg/dL   Hgb urine dipstick SMALL (A) NEGATIVE   Bilirubin Urine NEGATIVE NEGATIVE   Ketones, ur NEGATIVE NEGATIVE mg/dL   Protein, ur 30 (A) NEGATIVE mg/dL   Nitrite  NEGATIVE NEGATIVE   Leukocytes,Ua NEGATIVE NEGATIVE   RBC / HPF 0-5 0 - 5 RBC/hpf   WBC, UA 0-5 0 - 5 WBC/hpf   Bacteria, UA NONE SEEN NONE SEEN   Squamous Epithelial / LPF 0-5 0 - 5   Mucus PRESENT   Lactic acid, plasma  Result Value Ref Range   Lactic Acid, Venous 2.1 (HH) 0.5 - 1.9 mmol/L  Lactic acid, plasma  Result Value Ref Range   Lactic Acid, Venous 1.5 0.5 - 1.9 mmol/L  Protime-INR  Result Value Ref Range   Prothrombin Time 14.3 11.4 - 15.2 seconds   INR 1.1 0.8 - 1.2  APTT  Result Value Ref Range   aPTT 32 24 - 36 seconds  D-dimer, quantitative  Result Value Ref Range   D-Dimer, Quant 2.51 (H) 0.00 - 0.50 ug/mL-FEU  Procalcitonin  Result Value Ref Range   Procalcitonin <0.10 ng/mL  Magnesium  Result Value Ref Range   Magnesium 2.3 1.7 - 2.4 mg/dL   DG Chest 2 View  Result Date: 05/24/2021 CLINICAL DATA:  Fever and urinary frequency EXAM: CHEST - 2 VIEW COMPARISON:  08/17/2020 FINDINGS: The heart size and mediastinal contours are within normal limits. Both lungs are clear. The visualized skeletal structures are  unremarkable. IMPRESSION: No active cardiopulmonary disease. Electronically Signed   By: Franchot Gallo M.D.   On: 05/24/2021 14:41   CT Angio Chest PE W/Cm &/Or Wo Cm  Result Date: 05/24/2021 CLINICAL DATA:  Elevated D-dimer, tachycardia EXAM: CT ANGIOGRAPHY CHEST WITH CONTRAST TECHNIQUE: Multidetector CT imaging of the chest was performed using the standard protocol during bolus administration of intravenous contrast. Multiplanar CT image reconstructions and MIPs were obtained to evaluate the vascular anatomy. CONTRAST:  53mL OMNIPAQUE IOHEXOL 350 MG/ML SOLN COMPARISON:  None. FINDINGS: Cardiovascular: Normal heart size. No pericardial effusion. Thoracic aorta is normal in caliber minor calcified plaque. Satisfactory opacification of the pulmonary arteries to the proximal segmental level. No evidence of acute pulmonary embolism. Mediastinum/Nodes: No enlarged mediastinal or hilar lymph nodes. Mildly prominent but nonenlarged axillary and subpectoral lymph nodes. Lungs/Pleura: No pleural effusion or pneumothorax. There is a 4 mm peripheral nodular opacity of the right middle lobe (series 7, image 58). Upper Abdomen: No acute abnormality. Postoperative changes at the gastroesophageal junction. Musculoskeletal: Degenerative changes of the spine. No acute osseous abnormality. Review of the MIP images confirms the above findings. IMPRESSION: No evidence of acute pulmonary embolism or other acute abnormality. 4 mm right middle lobe peripheral nodular opacity. No follow-up needed if patient is low-risk. Non-contrast chest CT can be considered in 12 months if patient is high-risk. This recommendation follows the consensus statement: Guidelines for Management of Incidental Pulmonary Nodules Detected on CT Images: From the Fleischner Society 2017; Radiology 2017; 284:228-243. Electronically Signed   By: Macy Mis M.D.   On: 05/24/2021 16:41   CT Renal Stone Study  Result Date: 05/24/2021 CLINICAL DATA:  Flank  pain, urinary frequency EXAM: CT ABDOMEN AND PELVIS WITHOUT CONTRAST TECHNIQUE: Multidetector CT imaging of the abdomen and pelvis was performed following the standard protocol without IV contrast. COMPARISON:  None available FINDINGS: Lower chest: No acute abnormality. Hepatobiliary: Limited without IV contrast. Punctate hepatic dome calcified granuloma anteriorly. No large focal hepatic abnormality intrahepatic biliary dilatation. Gallbladder unremarkable. Common bile duct nondilated. Pancreas: Unremarkable. No pancreatic ductal dilatation or surrounding inflammatory changes. Spleen: Normal in size without focal abnormality. Adrenals/Urinary Tract: Adrenal glands are unremarkable. Kidneys are normal, without renal calculi, focal lesion, or hydronephrosis. Bladder  is unremarkable. Stomach/Bowel: Negative for bowel obstruction, significant dilatation, ileus, or free air. Normal appendix demonstrated. No free fluid, fluid collection, hemorrhage, hematoma abscess, ascites. Vascular/Lymphatic: Minor aortic atherosclerosis and tortuosity. Negative for aneurysm. Limited assessment without IV contrast. Mild bilateral external iliac adenopathy, nonspecific. Left external iliac has a short axis diameter 1.3 cm. No inguinal adenopathy. No significant retroperitoneal adenopathy. Reproductive: Remote hysterectomy. Calcifications noted in the pelvis appearing benign or postoperative. No adnexal abnormality. No pelvic free fluid. Other: Small fat containing umbilical hernia. No large ventral hernia. No inguinal hernia. Negative for ascites. Musculoskeletal: Thoracolumbar degenerative changes. Lower lumbar facet arthropathy. No acute osseous finding. IMPRESSION: No acute obstructing urinary tract or ureteral calculus. No obstructive uropathy or hydronephrosis. No other acute intra-abdominal or pelvic finding by noncontrast CT. Nonspecific bilateral mild external iliac adenopathy, short axis diameters measure up to 1.3 cm on the  left. Aortic Atherosclerosis (ICD10-I70.0). Electronically Signed   By: Jerilynn Mages.  Shick M.D.   On: 05/24/2021 11:47    ED Course/Procedures     Procedures  MDM  Incidental CTA findings of pulmonary nodules discussed with patient.  She verbalizes understanding and follow-up with PCP.  She remains tachycardic in the 120s despite IV hydration and antipyretic.  She is ill-appearing.  COVID swab is negative.  Unclear etiology though believe patient will benefit from admission for further management.       Kalla Watson, [place] N, PA-C 05/25/21 0012    Lucrezia Starch, MD 05/26/21 [phone]

## 2021-05-24 NOTE — ED Provider Notes (Signed)
[facility] EMERGENCY DEPARTMENT Provider Note   CSN: [id number] Arrival date & time: 05/24/21  4098     History Chief Complaint  Patient presents with   Urinary Frequency     Michaela Rios is a 70 y.o. female with a hx of hypertension, PVD, MRSA, migraines, anxiety, depression, anemia, IBS, & CKD who presents to the ED with complaints of urinary frequency/urgency x 1 week. Has since developed chills with left flank pain, mild, steady, no alleviating/aggravating factors. Denies fever, nausea, vomiting, abdominal pain, diarrhea, chest pain, cough, or dyspnea.  HPI     Past Medical History:  Diagnosis Date   Anemia    Anxiety    Arthritis    Cataract    Chronic migraine    follows with neuro for same   Clotting disorder ([place])    clot in ovarian vein- hx   Esophagitis    GERD (gastroesophageal reflux disease)    Hypertension    IBS (irritable bowel syndrome)    Low back pain syndrome    MRSA (methicillin resistant Staphylococcus aureus) infection 04/17/12   left torso   Peptic ulcer disease 2003, 12/2012   Peripheral vascular disease ([place])    Vitamin D deficiency     Patient Active Problem List   Diagnosis Date Noted   Urinary frequency 04/07/2021   Numbness and tingling in right hand 09/08/2020   Rhabdomyolysis 08/18/2020   Nausea & vomiting 08/18/2020   Intractable nausea and vomiting 08/18/2020   Elevated LFTs 08/17/2020   Hypokalemia 08/17/2020   Post herpetic neuralgia 06/30/2020   Urinary urgency 04/06/2020   Left lumbar radiculopathy 06/04/2018   CKD (chronic kidney disease) stage 3, GFR 30-59 ml/min ([place]) 06/04/2018   Pain management contract signed 06/03/2018   Encounter for pain management 03/05/2018   Memory difficulties 03/05/2018   Sialadenitis, right parotid 06/24/2017   Insomnia 05/12/2017   Prediabetes 11/08/2016   Vitamin D deficiency 06/08/2009   Depression 01/07/2009   PEPTIC ULCER DISEASE, CHRONIC 01/07/2009   OTHER  OBSTRUCTION OF DUODENUM 01/07/2009   HIATAL HERNIA 01/07/2009   Irritable bowel syndrome 01/07/2009   DEGENERATIVE JOINT DISEASE 01/07/2009   ANEMIA, HX OF 01/07/2009   PULMONARY NODULE 06/09/2008   Anxiety 04/01/2008   Essential hypertension 04/01/2008   Allergic rhinitis 04/01/2008   GERD 04/01/2008   LOW BACK PAIN SYNDROME 04/01/2008   Migraines 04/01/2008    Past Surgical History:  Procedure Laterality Date   ABDOMINAL HYSTERECTOMY     LAPAROSCOPIC BILATERAL SALPINGO OOPHERECTOMY     pud surgery w/duod sticture-plasty and vagotomy  2003   Dr. Marlou Starks   TONSILLECTOMY AND ADENOIDECTOMY     as a child   TUBAL LIGATION  1984     OB History   No obstetric history on file.     Family History  Problem Relation Age of Onset   Aneurysm Father    Prostate cancer Brother    Lung cancer Brother    Colon cancer Neg Hx    Esophageal cancer Neg Hx    Rectal cancer Neg Hx    Stomach cancer Neg Hx     Social History   Tobacco Use   Smoking status: Never   Smokeless tobacco: Never  Vaping Use   Vaping Use: Never used  Substance Use Topics   Alcohol use: Yes    Comment: rare   Drug use: No    Home Medications Prior to Admission medications   Medication Sig Start Date End Date  Taking? Authorizing Provider  acetaminophen (TYLENOL) 650 MG CR tablet Take 650-1,300 mg by mouth every 8 (eight) hours as needed for pain.    [provider]  amLODipine (NORVASC) 5 MG tablet TAKE ONE TABLET BY MOUTH EVERY MORNING 11/19/20   Burns, Claudina Lick, MD  buPROPion (WELLBUTRIN XL) 300 MG 24 hr tablet TAKE ONE TABLET BY MOUTH EVERY MORNING 01/29/21   Binnie Rail, MD  Cholecalciferol (VITAMIN D3 PO) Take 1 tablet by mouth daily.    [provider]  EPINEPHrine (EPIPEN 2-PAK) 0.3 mg/0.3 mL IJ SOAJ injection Inject 0.3 mLs (0.3 mg total) into the muscle as needed for anaphylaxis. 08/15/19   Binnie Rail, MD  furosemide (LASIX) 20 MG tablet TAKE ONE TABLET BY MOUTH EVERY MORNING  04/19/21   Burns, Claudina Lick, MD  hydrALAZINE (APRESOLINE) 25 MG tablet TAKE ONE TABLET BY MOUTH EVERY MORNING and TAKE ONE TABLET BY MOUTH EVERYDAY AT BEDTIME 01/29/21   Burns, Claudina Lick, MD  HYDROcodone-acetaminophen (NORCO) 7.5-325 MG tablet Take 1 tablet by mouth 2 (two) times daily as needed (migraine headache). 05/19/21   Binnie Rail, MD  LORazepam (ATIVAN) 1 MG tablet Take 1 tablet (1 mg total) by mouth 2 (two) times daily as needed. 04/29/21   Binnie Rail, MD  metoprolol succinate (TOPROL-XL) 100 MG 24 hr tablet TAKE ONE TABLET BY MOUTH EVERY MORNING WITH OR immediately following A meal 04/19/21   Burns, Claudina Lick, MD  nitrofurantoin, macrocrystal-monohydrate, (MACROBID) 100 MG capsule Take 1 capsule (100 mg total) by mouth 2 (two) times daily. 04/09/21   Binnie Rail, MD  omeprazole (PRILOSEC) 20 MG capsule Take 1 capsule (20 mg total) by mouth daily. Patient taking differently: Take 20 mg by mouth daily as needed (acid reflux). 05/15/20   Binnie Rail, MD  ondansetron (ZOFRAN) 4 MG tablet Take 1 tablet (4 mg total) by mouth every 6 (six) hours as needed for nausea. 08/19/20   Pokhrel, Corrie Mckusick, MD  SUMAtriptan (IMITREX) 100 MG tablet Take one tablet (100 mg) by mouth at onset of migraine headache, may repeat in 2 hours if still needed 04/07/21   Binnie Rail, MD  tiZANidine (ZANAFLEX) 4 MG tablet TAKE ONE TABLET BY MOUTH twice daily AS NEEDED FOR muscle SPASMS 05/03/21   Binnie Rail, MD  topiramate (TOPAMAX) 100 MG tablet Take 1.5 tablets (150 mg total) by mouth at bedtime. 02/02/21   Binnie Rail, MD  venlafaxine XR (EFFEXOR-XR) 75 MG 24 hr capsule TAKE THREE CAPSULES BY MOUTH EVERY MORNING 01/29/21   Burns, Claudina Lick, MD  zolpidem (AMBIEN) 10 MG tablet TAKE ONE TABLET BY MOUTH EVERYDAY AT BEDTIME AS NEEDED FOR SLEEP 04/29/21   Binnie Rail, MD    Allergies    Amitriptyline hcl, Shellfish allergy, Amoxicillin, Cephalexin, Lisinopril, and Penicillins  Review of Systems   Review of Systems   Constitutional:  Positive for chills. Negative for fever.  Respiratory:  Negative for cough and shortness of breath.   Cardiovascular:  Negative for chest pain.  Gastrointestinal:  Negative for abdominal pain, blood in stool, diarrhea, nausea and vomiting.  Genitourinary:  Positive for flank pain, frequency and urgency. Negative for dysuria.  Neurological:  Negative for syncope.  All other systems reviewed and are negative.  Physical Exam Updated Vital Signs BP (!) 164/116 (BP Location: Right Arm)   Pulse (!) 141   Temp 99.9 F (37.7 C) (Oral)   Resp 17   Ht 5\' 3"  (1.6 m)  Wt 60.8 kg   SpO2 96%   BMI 23.74 kg/m   Physical Exam Vitals and nursing note reviewed.  Constitutional:      General: She is not in acute distress.    Appearance: She is well-developed. She is not toxic-appearing.  HENT:     Head: Normocephalic and atraumatic.  Eyes:     General:        Right eye: No discharge.        Left eye: No discharge.     Conjunctiva/sclera: Conjunctivae normal.  Cardiovascular:     Rate and Rhythm: Regular rhythm. Tachycardia present.  Pulmonary:     Effort: Pulmonary effort is normal. No respiratory distress.     Breath sounds: Normal breath sounds. No wheezing, rhonchi or rales.  Abdominal:     General: There is no distension.     Palpations: Abdomen is soft.     Tenderness: There is abdominal tenderness (mild suprapubic). There is no guarding or rebound.  Musculoskeletal:     Cervical back: Neck supple.  Skin:    General: Skin is warm and dry.     Findings: No rash.  Neurological:     Mental Status: She is alert.     Comments: Clear speech.   Psychiatric:        Behavior: Behavior normal.    ED Results / Procedures / Treatments   Labs (all labs ordered are listed, but only abnormal results are displayed) Labs Reviewed  CBC - Abnormal; Notable for the following components:      Result Value   Hemoglobin 11.6 (*)    HCT 35.8 (*)    All other components  within normal limits  COMPREHENSIVE METABOLIC PANEL - Abnormal; Notable for the following components:   Potassium 2.8 (*)    Glucose, Bld 121 (*)    Albumin 3.4 (*)    All other components within normal limits  URINALYSIS, ROUTINE W REFLEX MICROSCOPIC - Abnormal; Notable for the following components:   Hgb urine dipstick SMALL (*)    Protein, ur 30 (*)    All other components within normal limits  LACTIC ACID, PLASMA - Abnormal; Notable for the following components:   Lactic Acid, Venous 2.1 (*)    All other components within normal limits  D-DIMER, QUANTITATIVE - Abnormal; Notable for the following components:   D-Dimer, Quant 2.51 (*)    All other components within normal limits  RESP PANEL BY RT-PCR (FLU A&B, COVID) [id number]  URINE CULTURE  CULTURE, BLOOD (ROUTINE X 2)  CULTURE, BLOOD (ROUTINE X 2)  LACTIC ACID, PLASMA  PROTIME-INR  APTT    EKG None  Radiology CT Renal Stone Study  Result Date: 05/24/2021 CLINICAL DATA:  Flank pain, urinary frequency EXAM: CT ABDOMEN AND PELVIS WITHOUT CONTRAST TECHNIQUE: Multidetector CT imaging of the abdomen and pelvis was performed following the standard protocol without IV contrast. COMPARISON:  None available FINDINGS: Lower chest: No acute abnormality. Hepatobiliary: Limited without IV contrast. Punctate hepatic dome calcified granuloma anteriorly. No large focal hepatic abnormality intrahepatic biliary dilatation. Gallbladder unremarkable. Common bile duct nondilated. Pancreas: Unremarkable. No pancreatic ductal dilatation or surrounding inflammatory changes. Spleen: Normal in size without focal abnormality. Adrenals/Urinary Tract: Adrenal glands are unremarkable. Kidneys are normal, without renal calculi, focal lesion, or hydronephrosis. Bladder is unremarkable. Stomach/Bowel: Negative for bowel obstruction, significant dilatation, ileus, or free air. Normal appendix demonstrated. No free fluid, fluid collection, hemorrhage, hematoma abscess,  ascites. Vascular/Lymphatic: Minor aortic atherosclerosis and tortuosity. Negative for aneurysm. Limited  assessment without IV contrast. Mild bilateral external iliac adenopathy, nonspecific. Left external iliac has a short axis diameter 1.3 cm. No inguinal adenopathy. No significant retroperitoneal adenopathy. Reproductive: Remote hysterectomy. Calcifications noted in the pelvis appearing benign or postoperative. No adnexal abnormality. No pelvic free fluid. Other: Small fat containing umbilical hernia. No large ventral hernia. No inguinal hernia. Negative for ascites. Musculoskeletal: Thoracolumbar degenerative changes. Lower lumbar facet arthropathy. No acute osseous finding. IMPRESSION: No acute obstructing urinary tract or ureteral calculus. No obstructive uropathy or hydronephrosis. No other acute intra-abdominal or pelvic finding by noncontrast CT. Nonspecific bilateral mild external iliac adenopathy, short axis diameters measure up to 1.3 cm on the left. Aortic Atherosclerosis (ICD10-I70.0). Electronically Signed   By: Jerilynn Mages.  Shick M.D.   On: 05/24/2021 11:47    Procedures Procedures   Medications Ordered in ED Medications - No data to display  ED Course  I have reviewed the triage vital signs and the nursing notes.  Pertinent labs & imaging results that were available during my care of the patient were reviewed by me and considered in my medical decision making (see chart for details).    MDM Rules/Calculators/A&P                          Patient presents to the ED with complaints of frequency/urgency.  Febrile on arrival with tachycardia.  Concern for UTI, does meet SIRS criteria, no hypotension, will initiate abx early 1L LR ordered, last EF 70-75%. Will initiate abx for suspected urinary source.   10:45: CONSULT: Discussed with ED pharmacist  Joetta Manners recommends gentamicin based on patient's allergies & prior culture reports. Hx of keflex allergy- patient reports hives & facial  swelling therefore cephalosporins avoided.   Additional history obtained:  Additional history obtained from chart review & nursing note review.   Lab Tests:  I Ordered, reviewed, and interpreted labs, which included:  CBC: Mild anemia CMP: Hypokalemia which will be replaced PT/INR/APTT: Within normal limits Initial lactic acid mildly elevated at 2.1, downtrending to 1.5. COVID/influenza: Testing negative  Imaging Studies ordered:  I ordered imaging studies which included CT renal stone study, I independently reviewed, formal radiology impression shows:   IMPRESSION: No acute obstructing urinary tract or ureteral calculus. No obstructive uropathy or hydronephrosis. No other acute intra-abdominal or pelvic finding by noncontrast CT. Nonspecific bilateral mild external iliac adenopathy, short axis diameters measure up to 1.3 cm on the left. Aortic Atherosclerosis (ICD10-I70.0).  ED Course:  Urinalysis significant UTI.  No obvious source to patient's fever.  She continues to be tachycardic.  Her oxygen levels are borderline, she denies respiratory complaints, however will add on chest x-ray and D-dimer at this time.  Chest x-ray without infiltrate to suggest pneumonia.  D-dimer is elevated therefore CT angio ordered.  Patient care signed out to Martinique Robinson, PA-C at change of shift pending CT angio and likely admission given fever unknown source with fairly persistent tachycardia.   Patient & her significant other updated on results & plan of care- in agreement.   Findings and plan of care discussed with supervising physician Dr. Roslynn Amble who has evaluated the patient, provided guidance, and is in agreement.  Portions of this note were generated with [employer]. Dictation errors may occur despite best attempts at proofreading.  Final Clinical Impression(s) / ED Diagnoses Final diagnoses:  None    Rx / DC Orders ED Discharge Orders     None  Leafy Kindle 05/24/21 1541    Lucrezia Starch, MD 05/26/21 [phone]

## 2021-05-24 NOTE — H&P (Addendum)
History and Physical    Michaela Rios DOB: 1951-04-08 DOA: 05/24/2021  PCP: Binnie Rail, MD Consultants:  none Patient coming from:  Home - lives with husband  Chief Complaint: weakness and fall   HPI: Michaela Rios is a 70 y.o. female with medical history significant of HTN, prediabetes, anxiety and depression, CKD stage 3, remote hx of PUD, chronic low back pain, migraines who came to ED for weakness and loss of balance.    She fell Sunday while trying to go to the bathroom and lost her balance. She states she was shivering and just not feeling well. Her husband then brought her to the hospital.  He states he thinks her symptoms started Friday.  She was very weak and couldn't walk without help. She has had poor PO intake over the last few days as well. She also has increased urinary frequency, but she drank a lot of water. She has no dysuria. She denies any recorded fevers at home, but has had chills. No N/V/D, no abdominal pain. No flank pain. She denies any URI/coughing. No headaches out of the ordinary and no vision changes.   She denies any chest pain or palpitations. No weight gain. Feels like her ankles may be slightly more swollen.    ED Course: bp: 164/116, HR: 141, RR: 17, temp: 99.9, oxygen 96% on room air. Tmax of 101.4 in ER.  Labs pertinent for potassium of 2.8 and elevated d-dimer. CTA with no PE. Given 2L IVF bolus, 21meq potassium IV x2, and gentamicin. Asked to admit for sepsis of unclear etiology.   Review of Systems: As per HPI; otherwise review of systems reviewed and negative.   Ambulatory Status:  Ambulates without assistance  COVID Vaccine Status: pfizer x2 and one booster.     Past Medical History:  Diagnosis Date   Anemia    Anxiety    Arthritis    Cataract    Chronic migraine    follows with neuro for same   Clotting disorder (HCC)    clot in ovarian vein- hx   Esophagitis    GERD (gastroesophageal reflux disease)    Hypertension     IBS (irritable bowel syndrome)    Low back pain syndrome    MRSA (methicillin resistant Staphylococcus aureus) infection 04/17/12   left torso   Peptic ulcer disease 2003, 12/2012   Peripheral vascular disease ([place])    Vitamin D deficiency     Past Surgical History:  Procedure Laterality Date   ABDOMINAL HYSTERECTOMY     LAPAROSCOPIC BILATERAL SALPINGO OOPHERECTOMY     pud surgery w/duod sticture-plasty and vagotomy  2003   Dr. Marlou Starks   TONSILLECTOMY AND ADENOIDECTOMY     as a child   TUBAL LIGATION  1984    Social History   Socioeconomic History   Marital status: Married    Spouse name: Levada Dy   Number of children: 1   Years of education: Not on file   Highest education level: Not on file  Occupational History   Occupation: retired  Tobacco Use   Smoking status: Never   Smokeless tobacco: Never  Vaping Use   Vaping Use: Never used  Substance and Sexual Activity   Alcohol use: Yes    Comment: rare   Drug use: No   Sexual activity: Yes  Other Topics Concern   Not on file  Social History Narrative   Lives with spouse    Grown son in [place], 2 g kids  Social Determinants of Health   Financial Resource Strain: Not on file  Food Insecurity: No Food Insecurity   Worried About Charity fundraiser in the Last Year: Never true   Ran Out of Food in the Last Year: Never true  Transportation Needs: No Transportation Needs   Lack of Transportation (Medical): No   Lack of Transportation (Non-Medical): No  Physical Activity: Inactive   Days of Exercise per Week: 0 days   Minutes of Exercise per Session: 0 min  Stress: Stress Concern Present   Feeling of Stress : Very much  Social Connections: Moderately Isolated   Frequency of Communication with Friends and Family: More than three times a week   Frequency of Social Gatherings with Friends and Family: Never   Attends Religious Services: Never   Marine scientist or Organizations: No   Attends Arts administrator: Never   Marital Status: Married  Human resources officer Violence: Not on file    Allergies  Allergen Reactions   Amitriptyline Hcl     Changed personality   Shellfish Allergy Nausea And Vomiting and Other (See Comments)    Tested "high" on allergy test   Amoxicillin Hives   Cephalexin Hives   Lisinopril Cough    .   Penicillins Hives    Family History  Problem Relation Age of Onset   Aneurysm Father    Prostate cancer Brother    Lung cancer Brother    Colon cancer Neg Hx    Esophageal cancer Neg Hx    Rectal cancer Neg Hx    Stomach cancer Neg Hx     Prior to Admission medications   Medication Sig Start Date End Date Taking? Authorizing Provider  acetaminophen (TYLENOL) 650 MG CR tablet Take 650-1,300 mg by mouth every 8 (eight) hours as needed for pain.    [provider]  amLODipine (NORVASC) 5 MG tablet TAKE ONE TABLET BY MOUTH EVERY MORNING 11/19/20   Burns, Claudina Lick, MD  buPROPion (WELLBUTRIN XL) 300 MG 24 hr tablet TAKE ONE TABLET BY MOUTH EVERY MORNING 01/29/21   Binnie Rail, MD  Cholecalciferol (VITAMIN D3 PO) Take 1 tablet by mouth daily.    [provider]  EPINEPHrine (EPIPEN 2-PAK) 0.3 mg/0.3 mL IJ SOAJ injection Inject 0.3 mLs (0.3 mg total) into the muscle as needed for anaphylaxis. 08/15/19   Binnie Rail, MD  furosemide (LASIX) 20 MG tablet TAKE ONE TABLET BY MOUTH EVERY MORNING 04/19/21   Burns, Claudina Lick, MD  hydrALAZINE (APRESOLINE) 25 MG tablet TAKE ONE TABLET BY MOUTH EVERY MORNING and TAKE ONE TABLET BY MOUTH EVERYDAY AT BEDTIME 01/29/21   Burns, Claudina Lick, MD  HYDROcodone-acetaminophen (NORCO) 7.5-325 MG tablet Take 1 tablet by mouth 2 (two) times daily as needed (migraine headache). 05/19/21   Binnie Rail, MD  LORazepam (ATIVAN) 1 MG tablet Take 1 tablet (1 mg total) by mouth 2 (two) times daily as needed. 04/29/21   Binnie Rail, MD  metoprolol succinate (TOPROL-XL) 100 MG 24 hr tablet TAKE ONE TABLET BY MOUTH EVERY MORNING WITH OR  immediately following A meal 04/19/21   Burns, Claudina Lick, MD  nitrofurantoin, macrocrystal-monohydrate, (MACROBID) 100 MG capsule Take 1 capsule (100 mg total) by mouth 2 (two) times daily. 04/09/21   Binnie Rail, MD  omeprazole (PRILOSEC) 20 MG capsule Take 1 capsule (20 mg total) by mouth daily. Patient taking differently: Take 20 mg by mouth daily as needed (acid reflux). 05/15/20  Binnie Rail, MD  ondansetron (ZOFRAN) 4 MG tablet Take 1 tablet (4 mg total) by mouth every 6 (six) hours as needed for nausea. 08/19/20   Pokhrel, Corrie Mckusick, MD  SUMAtriptan (IMITREX) 100 MG tablet Take one tablet (100 mg) by mouth at onset of migraine headache, may repeat in 2 hours if still needed 04/07/21   Binnie Rail, MD  tiZANidine (ZANAFLEX) 4 MG tablet TAKE ONE TABLET BY MOUTH twice daily AS NEEDED FOR muscle SPASMS 05/03/21   Binnie Rail, MD  topiramate (TOPAMAX) 100 MG tablet Take 1.5 tablets (150 mg total) by mouth at bedtime. 02/02/21   Binnie Rail, MD  venlafaxine XR (EFFEXOR-XR) 75 MG 24 hr capsule TAKE THREE CAPSULES BY MOUTH EVERY MORNING 01/29/21   Binnie Rail, MD  zolpidem (AMBIEN) 10 MG tablet TAKE ONE TABLET BY MOUTH EVERYDAY AT BEDTIME AS NEEDED FOR SLEEP 04/29/21   Binnie Rail, MD    Physical Exam: Vitals:   05/24/21 1730 05/24/21 1800 05/24/21 1815 05/24/21 1845  BP: (!) 156/106 (!) 164/112 (!) 165/112 (!) 161/119  Pulse: (!) 110  (!) 124   Resp: 14 16 19  (!) 22  Temp:      TempSrc:      SpO2: 98%  96%   Weight:      Height:         General:  Appears calm and comfortable. Has chills and looks ill.  Eyes:  PERRL, EOMI, normal lids, iris ENT:  grossly normal hearing, lips & tongue, slighly dry mucous membranes/lips. No cracking.  appropriate dentition Neck:  no LAD, masses or thyromegaly; no carotid bruits Cardiovascular:  sinus tachycardia, no m/r/g. No LE edema.  Respiratory:   CTA bilaterally with no wheezes/rales/rhonchi.  Normal respiratory effort. Abdomen:  soft, NT, ND,  NABS Back:   normal alignment, no CVAT Skin:  no rash or induration seen on limited exam Musculoskeletal:  grossly normal tone BUE/BLE, good ROM, no bony abnormality Lower extremity:  No LE edema.  Limited foot exam with no ulcerations.  2+ distal pulses. Psychiatric:  grossly normal mood and affect, speech fluent and appropriate, AOx3 Neurologic:  CN 2-12 grossly intact, moves all extremities in coordinated fashion, sensation intact. Diadochokinesis wnl: finger to nose wnl. Gait slow, but wnl.    Radiological Exams on Admission: Independently reviewed - see discussion in A/P where applicable  DG Chest 2 View  Result Date: 05/24/2021 CLINICAL DATA:  Fever and urinary frequency EXAM: CHEST - 2 VIEW COMPARISON:  08/17/2020 FINDINGS: The heart size and mediastinal contours are within normal limits. Both lungs are clear. The visualized skeletal structures are unremarkable. IMPRESSION: No active cardiopulmonary disease. Electronically Signed   By: Franchot Gallo M.D.   On: 05/24/2021 14:41   CT Angio Chest PE W/Cm &/Or Wo Cm  Result Date: 05/24/2021 CLINICAL DATA:  Elevated D-dimer, tachycardia EXAM: CT ANGIOGRAPHY CHEST WITH CONTRAST TECHNIQUE: Multidetector CT imaging of the chest was performed using the standard protocol during bolus administration of intravenous contrast. Multiplanar CT image reconstructions and MIPs were obtained to evaluate the vascular anatomy. CONTRAST:  67mL OMNIPAQUE IOHEXOL 350 MG/ML SOLN COMPARISON:  None. FINDINGS: Cardiovascular: Normal heart size. No pericardial effusion. Thoracic aorta is normal in caliber minor calcified plaque. Satisfactory opacification of the pulmonary arteries to the proximal segmental level. No evidence of acute pulmonary embolism. Mediastinum/Nodes: No enlarged mediastinal or hilar lymph nodes. Mildly prominent but nonenlarged axillary and subpectoral lymph nodes. Lungs/Pleura: No pleural effusion or pneumothorax. There is  a 4 mm peripheral  nodular opacity of the right middle lobe (series 7, image 58). Upper Abdomen: No acute abnormality. Postoperative changes at the gastroesophageal junction. Musculoskeletal: Degenerative changes of the spine. No acute osseous abnormality. Review of the MIP images confirms the above findings. IMPRESSION: No evidence of acute pulmonary embolism or other acute abnormality. 4 mm right middle lobe peripheral nodular opacity. No follow-up needed if patient is low-risk. Non-contrast chest CT can be considered in 12 months if patient is high-risk. This recommendation follows the consensus statement: Guidelines for Management of Incidental Pulmonary Nodules Detected on CT Images: From the Fleischner Society 2017; Radiology 2017; 284:228-243. Electronically Signed   By: Macy Mis M.D.   On: 05/24/2021 16:41   CT Renal Stone Study  Result Date: 05/24/2021 CLINICAL DATA:  Flank pain, urinary frequency EXAM: CT ABDOMEN AND PELVIS WITHOUT CONTRAST TECHNIQUE: Multidetector CT imaging of the abdomen and pelvis was performed following the standard protocol without IV contrast. COMPARISON:  None available FINDINGS: Lower chest: No acute abnormality. Hepatobiliary: Limited without IV contrast. Punctate hepatic dome calcified granuloma anteriorly. No large focal hepatic abnormality intrahepatic biliary dilatation. Gallbladder unremarkable. Common bile duct nondilated. Pancreas: Unremarkable. No pancreatic ductal dilatation or surrounding inflammatory changes. Spleen: Normal in size without focal abnormality. Adrenals/Urinary Tract: Adrenal glands are unremarkable. Kidneys are normal, without renal calculi, focal lesion, or hydronephrosis. Bladder is unremarkable. Stomach/Bowel: Negative for bowel obstruction, significant dilatation, ileus, or free air. Normal appendix demonstrated. No free fluid, fluid collection, hemorrhage, hematoma abscess, ascites. Vascular/Lymphatic: Minor aortic atherosclerosis and tortuosity. Negative  for aneurysm. Limited assessment without IV contrast. Mild bilateral external iliac adenopathy, nonspecific. Left external iliac has a short axis diameter 1.3 cm. No inguinal adenopathy. No significant retroperitoneal adenopathy. Reproductive: Remote hysterectomy. Calcifications noted in the pelvis appearing benign or postoperative. No adnexal abnormality. No pelvic free fluid. Other: Small fat containing umbilical hernia. No large ventral hernia. No inguinal hernia. Negative for ascites. Musculoskeletal: Thoracolumbar degenerative changes. Lower lumbar facet arthropathy. No acute osseous finding. IMPRESSION: No acute obstructing urinary tract or ureteral calculus. No obstructive uropathy or hydronephrosis. No other acute intra-abdominal or pelvic finding by noncontrast CT. Nonspecific bilateral mild external iliac adenopathy, short axis diameters measure up to 1.3 cm on the left. Aortic Atherosclerosis (ICD10-I70.0). Electronically Signed   By: Jerilynn Mages.  Shick M.D.   On: 05/24/2021 11:47    EKG: Independently reviewed.  Sinus tachy with rate 138; nonspecific ST changes with no evidence of acute ischemia   Labs on Admission: I have personally reviewed the available labs and imaging studies at the time of the admission.  Pertinent labs:  Potassium: 2.8 Lactic acid: 2.1--->1.5 D-dimer: 2.51   Assessment/Plan Principal Problem:   Sepsis ([place]) -unknown source. Remains sinus tachy. Fever to 101.4.  - lactic acid initially elevated, likely secondary to volume depletion/poor PO intake.  -not clinically suspicious for sepsis, but will initiate broad spectrum antibiotics/pharmacy consult and clinically follow and tailor medication as needed. -blood cultures/urine cultures pending -added on procalcitonin -CXR with no acute finding.  -continue IVF overnight.   Active Problems:   Hypokalemia Replaced with 70meq in the ER via IV, will also give oral dose 80meq x1.  -magnesium pending -repeat in AM.      Tachycardia She has fever which could contribute to elevated heart rate and poor PO intake. Has not responded much to boluses. CTA negative for PE  -has not had any of her home medication, including her beta blocker. Will start this back -keep her  on tele and continue anti pyretics.  -monitor, but if does not respond to antipyretics/beta blocker further intervention.     Essential hypertension -quite HTN today, has not had home medication. Will resume these. Recent visit to PCP and BP was decently controlled.  -norvasc 5mg , hydralazine 25mg  BID and metoprolol 100mg /daily     Anxiety -continue home medication: ativan prn and effexor.  -could be factor in tachycardia as well.     Depression Continue home medication of wellbutrin and effexor.  -    Prediabetes -well controlled. A1c last month: 5.8    CKD (chronic kidney disease) stage 3, GFR 30-59 ml/min (HCC)  Creatinine actually wnl. GFR> 60  Trend   Weakness Will have PT eval and assess for fall risk.   Migraines -continue her topiramate, imitrex prn and norco prn   Pulmonary nodule on CT 26mm. No follow up if low risk, f/u outpatient with PCP.   Body mass index is 23.74 kg/m.  Note: This patient has been tested and is negative for the novel coronavirus COVID-19. The patient has been fully vaccinated against COVID-19.   Level of care: Telemetry Medical DVT prophylaxis:  Lovenox  Code Status:  Full - confirmed with patient Family Communication: husband: Levada Dy Kennan Disposition Plan:  The patient is from: home  Anticipated d/c date will depend on clinical response to treatment, but possibly as early as tomorrow if she has excellent response to treatment   Consults called: none  Admission status:  observation    Orma Flaming MD [facility]   How to contact the [facility] Attending or Consulting provider [place] or covering provider during after hours [place], for this patient?  Check the care team in [facility] and  look for a) attending/consulting [facility] provider listed and b) the [facility] team listed Log into [email or web address] and use Rafael Hernandez's universal password to access. If you do not have the password, please contact the hospital operator. Locate the [facility] provider you are looking for under [facility] and page to a number that you can be directly reached. If you still have difficulty reaching the provider, please page the [facility] (Director on Call) for the Hospitalists listed on amion for assistance.   05/24/2021, 7:52 PM

## 2021-05-24 NOTE — Telephone Encounter (Signed)
Please Advise, pharmacy requesting new prescription for Hydrocodone-acetaminophen (Norco) for 90 days.  Patient is requesting a refill of the following medications: Requested Prescriptions   Pending Prescriptions Disp Refills   HYDROcodone-acetaminophen (NORCO) 7.5-325 MG tablet 20 tablet 0    Sig: Take 1 tablet by mouth 2 (two) times daily as needed (migraine headache).    Date of patient request: 05/20/19  Last office visit: 04/07/21  Date of last refill: 05/19/21  Last refill amount: 20,0 Follow up time period per chart: 07/07/21

## 2021-05-24 NOTE — ED Triage Notes (Signed)
Pt here from home with c/o UTI type symptoms , frequency , no burning or bleeding , hx of uti , some mild left flank pain

## 2021-05-24 NOTE — Progress Notes (Signed)
Pharmacy Antibiotic Note  Michaela Rios is a 70 y.o. female admitted on 05/24/2021 with sepsis; unknown source.  Pharmacy has been consulted for aztreonam and vancomycin dosing.  Plan: Aztreonam 2g q8h Vancomycin 1250 mg LD; then give Vancomycin 1000 mg q24h unless there is a change in renal function Adjust dose/frequency of vancomycin and aztreonam as needed for renal function Follow patient for opportunities to streamline antibiotics  Height: 5\' 3"  (160 cm) Weight: 60.8 kg (134 lb) IBW/kg (Calculated) : 52.4  Temp (24hrs), Avg:100 F (37.8 C), Min:98.8 F (37.1 C), Max:101.4 F (38.6 C)  Recent Labs  Lab 05/24/21 1005 05/24/21 1051 05/24/21 1227  WBC 8.6  --   --   CREATININE 0.77  --   --   LATICACIDVEN  --  2.1* 1.5    Estimated Creatinine Clearance: 54.1 mL/min (by C-G formula based on SCr of 0.77 mg/dL).    Allergies  Allergen Reactions   Amitriptyline Hcl     Changed personality   Shellfish Allergy Nausea And Vomiting and Other (See Comments)    Tested "high" on allergy test   Amoxicillin Hives   Cephalexin Hives   Lisinopril Cough    .   Penicillins Hives    Antimicrobials this admission: Gentamicin x 1 dose in ED 6/27 Vancomycin 6/27 >> Aztreonam 6/27 >>  Dose adjustments this admission: N/A  Microbiology results: Pending  Thank you for allowing pharmacy to be a part of this patient's care.  Lorelei Pont, PharmD, BCPS 05/24/2021 8:33 PM ED Clinical Pharmacist -  [phone]

## 2021-05-24 NOTE — Progress Notes (Signed)
   05/24/21 2200  Assess: MEWS Score  Temp 99.5 F (37.5 C)  BP (!) 154/92  Pulse Rate (!) 112  ECG Heart Rate (!) 116  Resp 18  Level of Consciousness Alert  Assess: MEWS Score  MEWS Temp 0  MEWS Systolic 0  MEWS Pulse 2  MEWS RR 0  MEWS LOC 0  MEWS Score 2  MEWS Score Color Yellow  Assess: if the MEWS score is Yellow or Red  Were vital signs taken at a resting state? Yes  Focused Assessment No change from prior assessment  Early Detection of Sepsis Score *See Row Information* Medium  MEWS guidelines implemented *See Row Information* Yes  Treat  MEWS Interventions Administered scheduled meds/treatments  Pain Scale 0-10  Pain Score 0  Take Vital Signs  Increase Vital Sign Frequency  Yellow: Q 2hr X 2 then Q 4hr X 2, if remains yellow, continue Q 4hrs  Escalate  MEWS: Escalate Yellow: discuss with charge nurse/RN and consider discussing with provider and RRT  Notify: Charge Nurse/RN  Name of Charge Nurse/RN Notified Ajane Novella RN  Date Charge Nurse/RN Notified 05/24/21  Time Charge Nurse/RN Notified 2210  Document  Progress note created (see row info) Yes

## 2021-05-25 ENCOUNTER — Other Ambulatory Visit: Payer: Self-pay

## 2021-05-25 ENCOUNTER — Inpatient Hospital Stay (HOSPITAL_COMMUNITY): Payer: Medicare HMO

## 2021-05-25 ENCOUNTER — Observation Stay (HOSPITAL_BASED_OUTPATIENT_CLINIC_OR_DEPARTMENT_OTHER): Payer: Medicare HMO

## 2021-05-25 DIAGNOSIS — R69 Illness, unspecified: Secondary | ICD-10-CM | POA: Diagnosis not present

## 2021-05-25 DIAGNOSIS — L7632 Postprocedural hematoma of skin and subcutaneous tissue following other procedure: Secondary | ICD-10-CM | POA: Diagnosis not present

## 2021-05-25 DIAGNOSIS — I776 Arteritis, unspecified: Secondary | ICD-10-CM | POA: Diagnosis not present

## 2021-05-25 DIAGNOSIS — I129 Hypertensive chronic kidney disease with stage 1 through stage 4 chronic kidney disease, or unspecified chronic kidney disease: Secondary | ICD-10-CM | POA: Diagnosis present

## 2021-05-25 DIAGNOSIS — G43909 Migraine, unspecified, not intractable, without status migrainosus: Secondary | ICD-10-CM | POA: Diagnosis present

## 2021-05-25 DIAGNOSIS — R29818 Other symptoms and signs involving the nervous system: Secondary | ICD-10-CM | POA: Diagnosis not present

## 2021-05-25 DIAGNOSIS — Q211 Atrial septal defect: Secondary | ICD-10-CM | POA: Diagnosis not present

## 2021-05-25 DIAGNOSIS — N1831 Chronic kidney disease, stage 3a: Secondary | ICD-10-CM | POA: Diagnosis not present

## 2021-05-25 DIAGNOSIS — R930 Abnormal findings on diagnostic imaging of skull and head, not elsewhere classified: Secondary | ICD-10-CM | POA: Diagnosis not present

## 2021-05-25 DIAGNOSIS — C8590 Non-Hodgkin lymphoma, unspecified, unspecified site: Secondary | ICD-10-CM | POA: Diagnosis not present

## 2021-05-25 DIAGNOSIS — G8194 Hemiplegia, unspecified affecting left nondominant side: Secondary | ICD-10-CM | POA: Diagnosis not present

## 2021-05-25 DIAGNOSIS — F3289 Other specified depressive episodes: Secondary | ICD-10-CM | POA: Diagnosis not present

## 2021-05-25 DIAGNOSIS — Z20822 Contact with and (suspected) exposure to covid-19: Secondary | ICD-10-CM | POA: Diagnosis not present

## 2021-05-25 DIAGNOSIS — R109 Unspecified abdominal pain: Secondary | ICD-10-CM | POA: Diagnosis not present

## 2021-05-25 DIAGNOSIS — A419 Sepsis, unspecified organism: Secondary | ICD-10-CM | POA: Diagnosis not present

## 2021-05-25 DIAGNOSIS — R6510 Systemic inflammatory response syndrome (SIRS) of non-infectious origin without acute organ dysfunction: Secondary | ICD-10-CM | POA: Diagnosis not present

## 2021-05-25 DIAGNOSIS — I6389 Other cerebral infarction: Secondary | ICD-10-CM | POA: Diagnosis not present

## 2021-05-25 DIAGNOSIS — R9389 Abnormal findings on diagnostic imaging of other specified body structures: Secondary | ICD-10-CM | POA: Diagnosis not present

## 2021-05-25 DIAGNOSIS — M79603 Pain in arm, unspecified: Secondary | ICD-10-CM

## 2021-05-25 DIAGNOSIS — N183 Chronic kidney disease, stage 3 unspecified: Secondary | ICD-10-CM | POA: Diagnosis not present

## 2021-05-25 DIAGNOSIS — D631 Anemia in chronic kidney disease: Secondary | ICD-10-CM | POA: Diagnosis not present

## 2021-05-25 DIAGNOSIS — I739 Peripheral vascular disease, unspecified: Secondary | ICD-10-CM | POA: Diagnosis not present

## 2021-05-25 DIAGNOSIS — R519 Headache, unspecified: Secondary | ICD-10-CM | POA: Diagnosis not present

## 2021-05-25 DIAGNOSIS — I6381 Other cerebral infarction due to occlusion or stenosis of small artery: Secondary | ICD-10-CM | POA: Diagnosis not present

## 2021-05-25 DIAGNOSIS — I6330 Cerebral infarction due to thrombosis of unspecified cerebral artery: Secondary | ICD-10-CM | POA: Diagnosis not present

## 2021-05-25 DIAGNOSIS — N182 Chronic kidney disease, stage 2 (mild): Secondary | ICD-10-CM | POA: Diagnosis present

## 2021-05-25 DIAGNOSIS — I672 Cerebral atherosclerosis: Secondary | ICD-10-CM | POA: Diagnosis not present

## 2021-05-25 DIAGNOSIS — M545 Low back pain, unspecified: Secondary | ICD-10-CM | POA: Diagnosis present

## 2021-05-25 DIAGNOSIS — K760 Fatty (change of) liver, not elsewhere classified: Secondary | ICD-10-CM | POA: Diagnosis not present

## 2021-05-25 DIAGNOSIS — I081 Rheumatic disorders of both mitral and tricuspid valves: Secondary | ICD-10-CM | POA: Diagnosis not present

## 2021-05-25 DIAGNOSIS — I97630 Postprocedural hematoma of a circulatory system organ or structure following a cardiac catheterization: Secondary | ICD-10-CM | POA: Diagnosis not present

## 2021-05-25 DIAGNOSIS — N1832 Chronic kidney disease, stage 3b: Secondary | ICD-10-CM | POA: Diagnosis not present

## 2021-05-25 DIAGNOSIS — R9089 Other abnormal findings on diagnostic imaging of central nervous system: Secondary | ICD-10-CM | POA: Diagnosis not present

## 2021-05-25 DIAGNOSIS — G43009 Migraine without aura, not intractable, without status migrainosus: Secondary | ICD-10-CM | POA: Diagnosis not present

## 2021-05-25 DIAGNOSIS — R000 Tachycardia, unspecified: Secondary | ICD-10-CM | POA: Diagnosis not present

## 2021-05-25 DIAGNOSIS — I639 Cerebral infarction, unspecified: Secondary | ICD-10-CM | POA: Diagnosis not present

## 2021-05-25 DIAGNOSIS — D649 Anemia, unspecified: Secondary | ICD-10-CM | POA: Diagnosis not present

## 2021-05-25 DIAGNOSIS — G4700 Insomnia, unspecified: Secondary | ICD-10-CM | POA: Diagnosis not present

## 2021-05-25 DIAGNOSIS — G936 Cerebral edema: Secondary | ICD-10-CM | POA: Diagnosis not present

## 2021-05-25 DIAGNOSIS — Y838 Other surgical procedures as the cause of abnormal reaction of the patient, or of later complication, without mention of misadventure at the time of the procedure: Secondary | ICD-10-CM | POA: Diagnosis not present

## 2021-05-25 DIAGNOSIS — R7989 Other specified abnormal findings of blood chemistry: Secondary | ICD-10-CM | POA: Diagnosis present

## 2021-05-25 DIAGNOSIS — Z9889 Other specified postprocedural states: Secondary | ICD-10-CM | POA: Diagnosis not present

## 2021-05-25 DIAGNOSIS — M79602 Pain in left arm: Secondary | ICD-10-CM | POA: Diagnosis not present

## 2021-05-25 DIAGNOSIS — G319 Degenerative disease of nervous system, unspecified: Secondary | ICD-10-CM | POA: Diagnosis not present

## 2021-05-25 DIAGNOSIS — R9402 Abnormal brain scan: Secondary | ICD-10-CM | POA: Diagnosis not present

## 2021-05-25 DIAGNOSIS — F419 Anxiety disorder, unspecified: Secondary | ICD-10-CM | POA: Diagnosis present

## 2021-05-25 DIAGNOSIS — E559 Vitamin D deficiency, unspecified: Secondary | ICD-10-CM | POA: Diagnosis not present

## 2021-05-25 DIAGNOSIS — I63233 Cerebral infarction due to unspecified occlusion or stenosis of bilateral carotid arteries: Secondary | ICD-10-CM | POA: Diagnosis not present

## 2021-05-25 DIAGNOSIS — G0490 Encephalitis and encephalomyelitis, unspecified: Secondary | ICD-10-CM | POA: Diagnosis not present

## 2021-05-25 DIAGNOSIS — E876 Hypokalemia: Secondary | ICD-10-CM | POA: Diagnosis not present

## 2021-05-25 DIAGNOSIS — G9341 Metabolic encephalopathy: Secondary | ICD-10-CM | POA: Diagnosis not present

## 2021-05-25 DIAGNOSIS — I10 Essential (primary) hypertension: Secondary | ICD-10-CM | POA: Diagnosis not present

## 2021-05-25 DIAGNOSIS — Z8711 Personal history of peptic ulcer disease: Secondary | ICD-10-CM | POA: Diagnosis not present

## 2021-05-25 DIAGNOSIS — I6359 Cerebral infarction due to unspecified occlusion or stenosis of other cerebral artery: Secondary | ICD-10-CM | POA: Diagnosis not present

## 2021-05-25 DIAGNOSIS — I6340 Cerebral infarction due to embolism of unspecified cerebral artery: Secondary | ICD-10-CM | POA: Diagnosis not present

## 2021-05-25 DIAGNOSIS — E871 Hypo-osmolality and hyponatremia: Secondary | ICD-10-CM | POA: Diagnosis not present

## 2021-05-25 DIAGNOSIS — G8929 Other chronic pain: Secondary | ICD-10-CM | POA: Diagnosis present

## 2021-05-25 DIAGNOSIS — R509 Fever, unspecified: Secondary | ICD-10-CM | POA: Diagnosis not present

## 2021-05-25 DIAGNOSIS — E869 Volume depletion, unspecified: Secondary | ICD-10-CM | POA: Diagnosis not present

## 2021-05-25 DIAGNOSIS — F32A Depression, unspecified: Secondary | ICD-10-CM | POA: Diagnosis present

## 2021-05-25 DIAGNOSIS — R7303 Prediabetes: Secondary | ICD-10-CM | POA: Diagnosis not present

## 2021-05-25 LAB — CBC
HCT: 32.9 % — ABNORMAL LOW (ref 36.0–46.0)
Hemoglobin: 10.5 g/dL — ABNORMAL LOW (ref 12.0–15.0)
MCH: 29.7 pg (ref 26.0–34.0)
MCHC: 31.9 g/dL (ref 30.0–36.0)
MCV: 93.2 fL (ref 80.0–100.0)
Platelets: 199 10*3/uL (ref 150–400)
RBC: 3.53 MIL/uL — ABNORMAL LOW (ref 3.87–5.11)
RDW: 15.5 % (ref 11.5–15.5)
WBC: 8.1 10*3/uL (ref 4.0–10.5)
nRBC: 0 % (ref 0.0–0.2)

## 2021-05-25 LAB — BASIC METABOLIC PANEL
Anion gap: 7 (ref 5–15)
BUN: 5 mg/dL — ABNORMAL LOW (ref 8–23)
CO2: 24 mmol/L (ref 22–32)
Calcium: 8.7 mg/dL — ABNORMAL LOW (ref 8.9–10.3)
Chloride: 106 mmol/L (ref 98–111)
Creatinine, Ser: 0.71 mg/dL (ref 0.44–1.00)
GFR, Estimated: >60 mL/min (ref >60)
Glucose, Bld: 101 mg/dL — ABNORMAL HIGH (ref 70–99)
Potassium: 3.9 mmol/L (ref 3.5–5.1)
Sodium: 137 mmol/L (ref 135–145)

## 2021-05-25 LAB — URINE CULTURE: Culture: NO GROWTH

## 2021-05-25 IMAGING — CT CT ABD-PELV W/ CM
2 of 5 series · 17 of 46 positions shown, 19 images · IV contrast (APPLIED)
Comparison: [DATE]

CLINICAL DATA: Abdominal pain, fever

EXAM:
CT ABDOMEN AND PELVIS WITH CONTRAST
TECHNIQUE: Multidetector CT imaging of the abdomen and pelvis was performed
using the standard protocol following bolus administration of
intravenous contrast.
CONTRAST:  100mL OMNIPAQUE IOHEXOL 300 MG/ML  SOLN

[Series 3: abdomen 5.0 · axial · 0.86mm/px · z∈[-588,-228]mm · 14 of 84 slices shown, 16 images]
[im 6/84  soft-tissue]
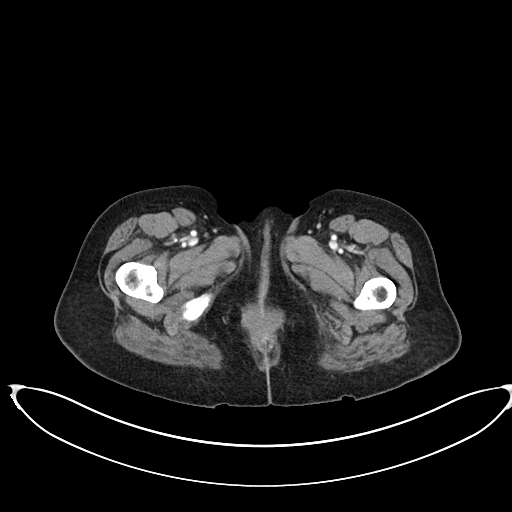
[im 6/84  bone]
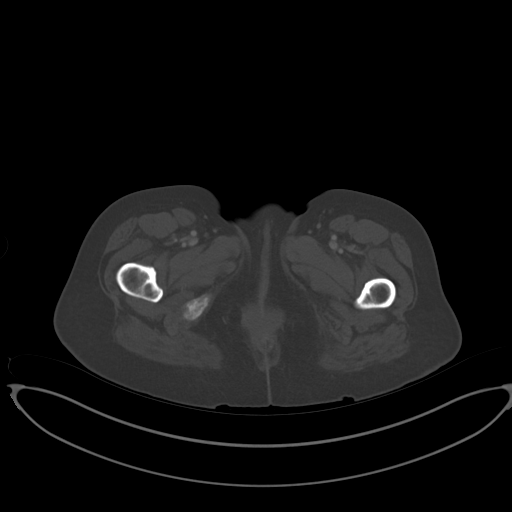
[im 11/84  soft-tissue]
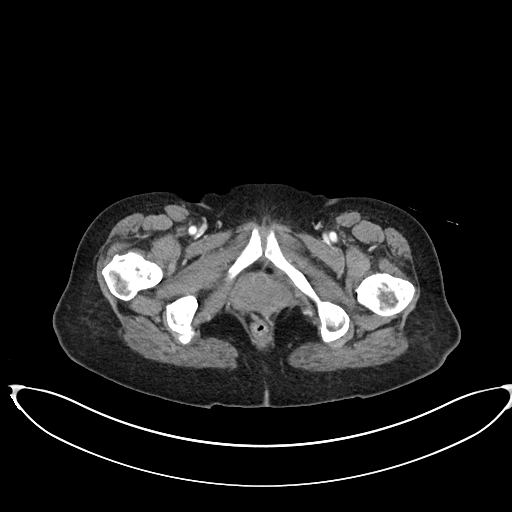
[im 16/84  soft-tissue]
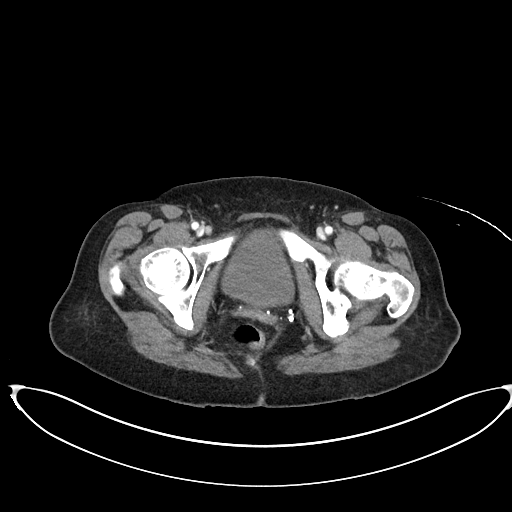
[im 21/84  soft-tissue]
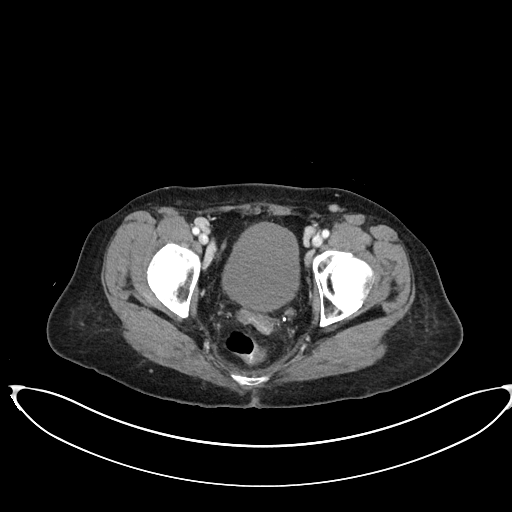
[im 26/84  soft-tissue]
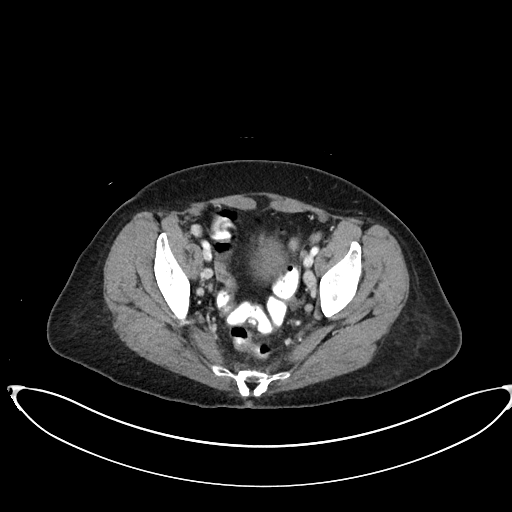
[im 32/84  soft-tissue]
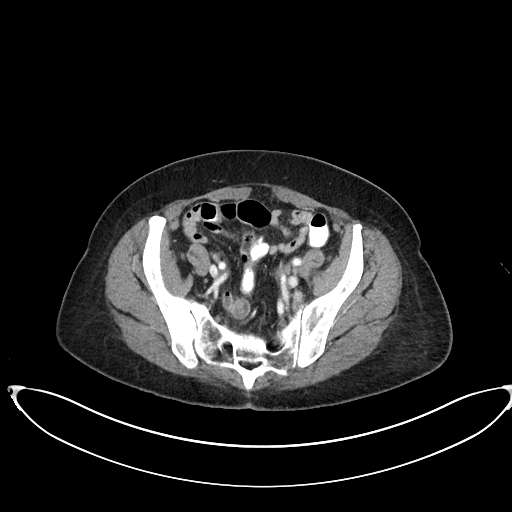
[im 37/84  soft-tissue]
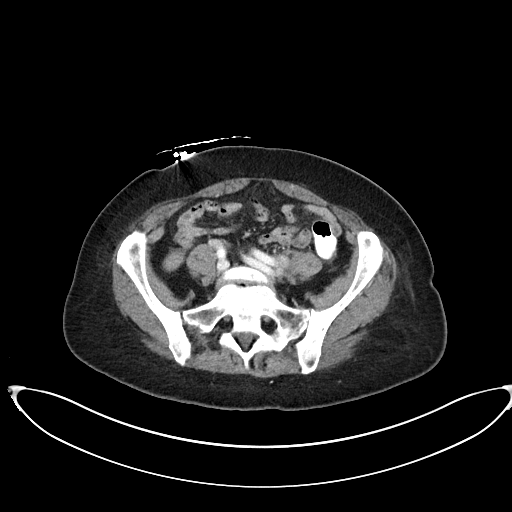
[im 47/84  soft-tissue]
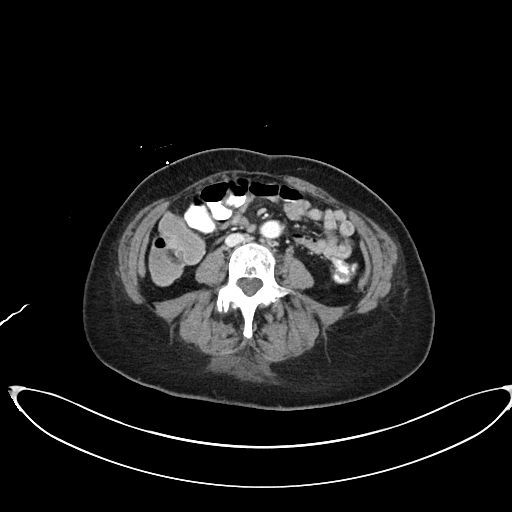
[im 52/84  soft-tissue]
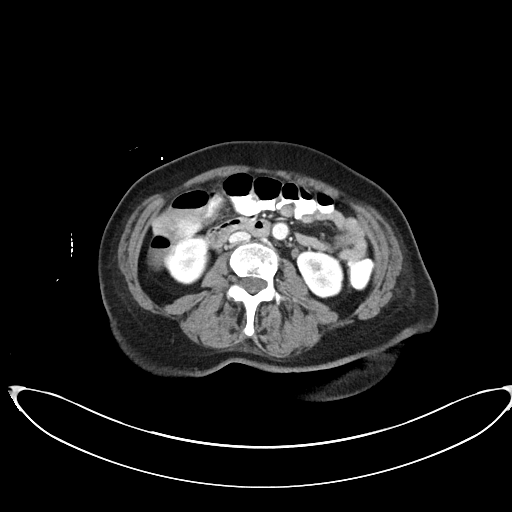
[im 52/84  bone]
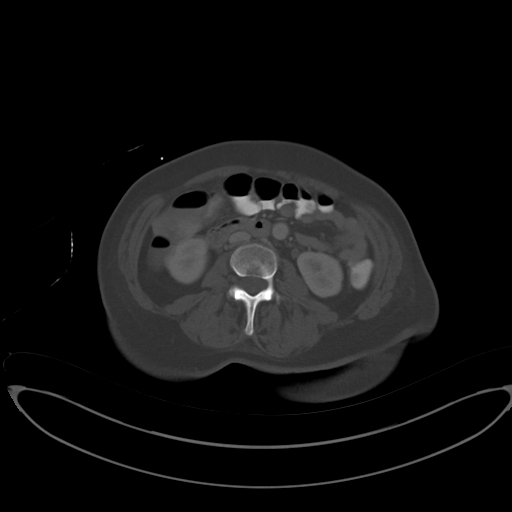
[im 58/84  soft-tissue]
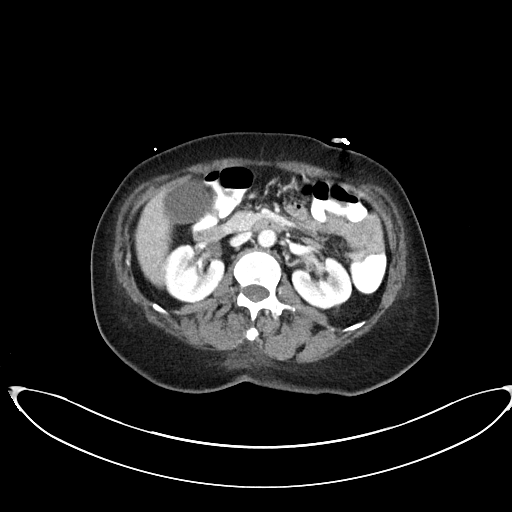
[im 63/84  soft-tissue]
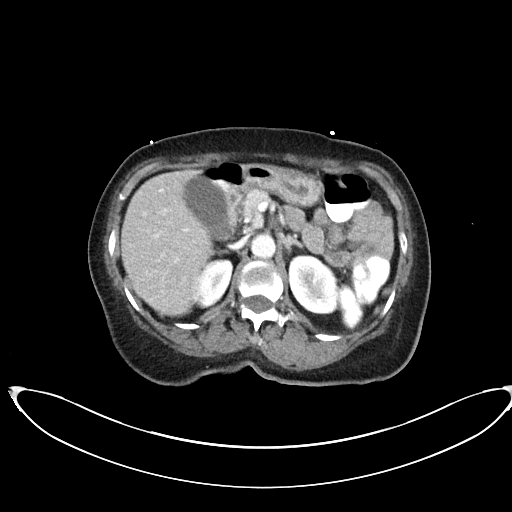
[im 68/84  soft-tissue]
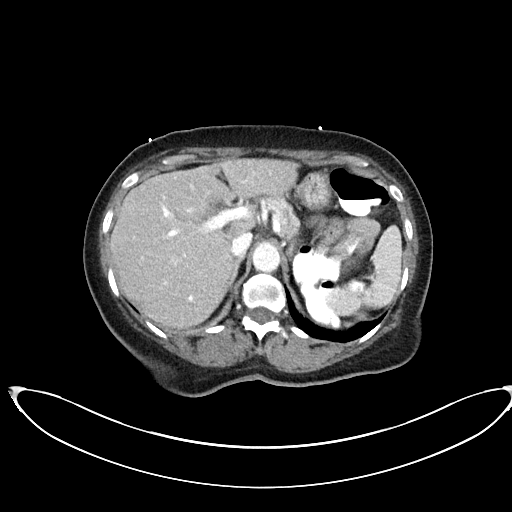
[im 73/84  soft-tissue]
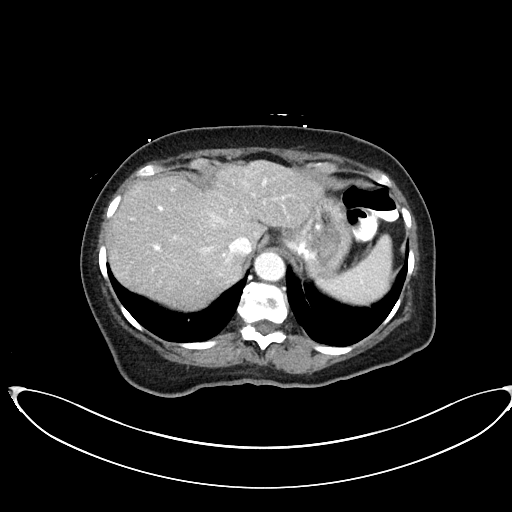
[im 78/84  soft-tissue]
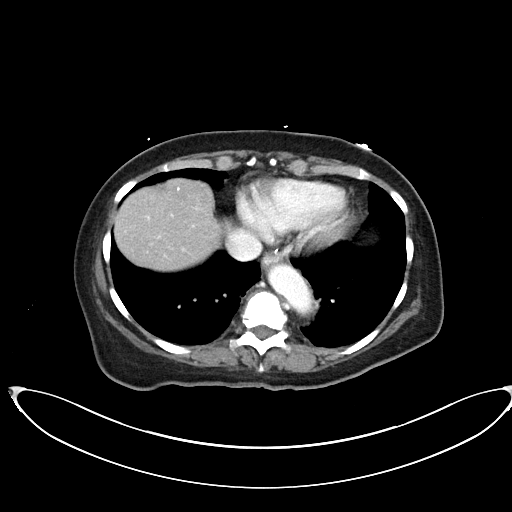

[Series 6: abdomen 3.0 mpr cor · coronal · 0.71mm/px · 3 of 88 slices shown]
[im 30/88  soft-tissue]
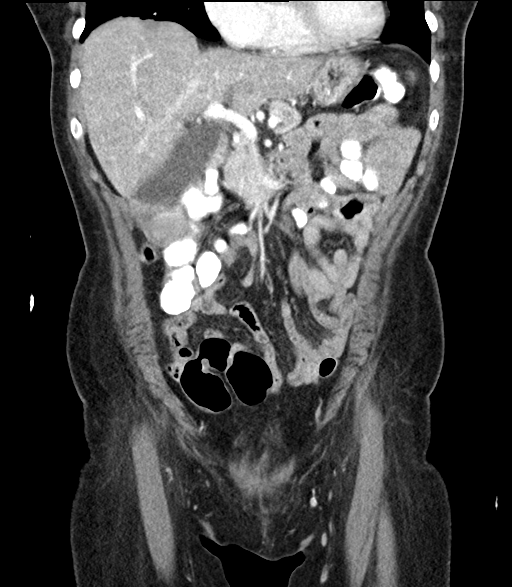
[im 39/88  soft-tissue]
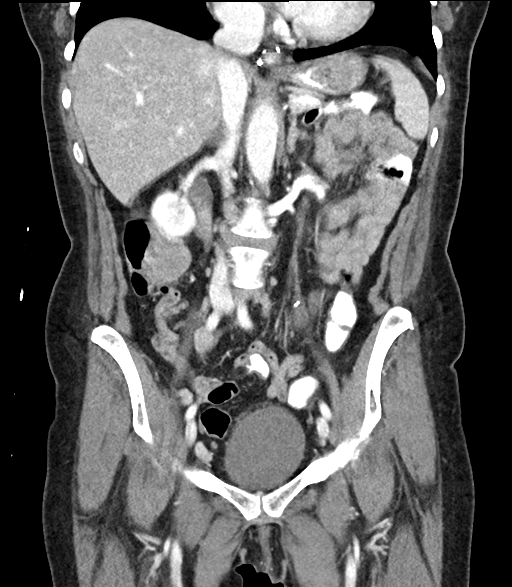
[im 49/88  soft-tissue]
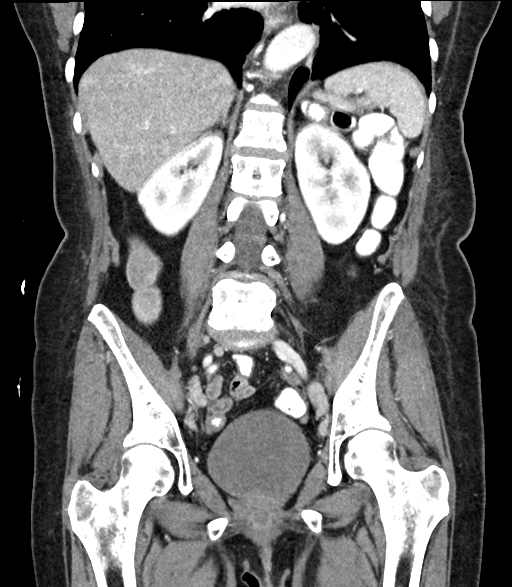

[17 of 46 positions shown; findings below may reference images not displayed]

FINDINGS: Lower chest: No acute abnormality.

Hepatobiliary: Diffuse low-density throughout the liver compatible
with fatty infiltration. No focal abnormality. Gallbladder
unremarkable.

Pancreas: No focal abnormality or ductal dilatation.

Spleen: No focal abnormality.  Normal size.

Adrenals/Urinary Tract: No adrenal abnormality. No focal renal
abnormality. No stones or hydronephrosis. Urinary bladder is
unremarkable.

Stomach/Bowel: Normal appendix. Stomach, large and small bowel
grossly unremarkable.

Vascular/Lymphatic: No evidence of aneurysm or adenopathy.

Reproductive: Prior hysterectomy.  No adnexal masses.

Other: No free fluid or free air.

Musculoskeletal: No acute bony abnormality.
IMPRESSION: Hepatic steatosis.

No acute findings in the abdomen or pelvis.

## 2021-05-25 MED ORDER — IOHEXOL 300 MG/ML  SOLN
100.0000 mL | Freq: Once | INTRAMUSCULAR | Status: AC | PRN
  Administered 2021-05-25: 100 mL via INTRAVENOUS

## 2021-05-25 MED ORDER — IOHEXOL 9 MG/ML PO SOLN
ORAL | Status: AC
  Filled 2021-05-25: qty 1000

## 2021-05-25 MED ORDER — HYDROCODONE-ACETAMINOPHEN 7.5-325 MG PO TABS: 1.0000 | ORAL_TABLET | Freq: Two times a day (BID) | ORAL | 0 refills | Status: DC | PRN

## 2021-05-25 MED ORDER — ADULT MULTIVITAMIN W/MINERALS CH
1.0000 | ORAL_TABLET | Freq: Every day | ORAL | Status: DC
  Administered 2021-05-25 – 2021-06-08 (×15): 1 via ORAL
  Filled 2021-05-25 (×15): qty 1

## 2021-05-25 NOTE — Progress Notes (Signed)
Initial Nutrition Assessment  DOCUMENTATION CODES:  Not applicable  INTERVENTION:  Continue regular diet.  Continue Ensure Enlive BID.  Add Magic cup TID with meals, each supplement provides 290 kcal and 9 grams of protein.  Add MVI with minerals daily.  NUTRITION DIAGNOSIS:  Inadequate oral intake related to decreased appetite as evidenced by per patient/family report, meal completion < 25%.  GOAL:  Patient will meet greater than or equal to 90% of their needs  MONITOR:  PO intake, Supplement acceptance, Labs, Weight trends, I & O's  REASON FOR ASSESSMENT:  Malnutrition Screening Tool    ASSESSMENT:  70 yo female with a PMH of HTN, prediabetes, anxiety and depression, CKD stage 3, remote hx of PUD, chronic low back pain, and migraines who presents with sepsis from an unknown origin.  RD working remotely.  Attempted to call pt over the phone. Pt unavailable but was able to speak with pt's husband. Pt's husband reports that pt has been eating less over the past 3-4 days, but was eating well prior. They usually have bacon, toast, and grapefruit juice in the morning, a snack in the afternoon of some crackers or the like, then for dinner, they have pizza, burgers, salads, meats, etc.   Pt's husband denies any significant weight changes in the pt. Per Epic, pt has lost ~21 lbs (13.7%) in the last 16 months, which is not necessarily significant for the time frame.  Suspect at least moderate malnutrition given above information, but cannot diagnose definitively at this time. NFPE will be conducted on follow-up.   Recommend adding Magic Cup TID and continuing Ensure Enlive BID. Husband confirms that he will make sure wife drinks her supplements. Also recommend MVI with minerals.  Medications: reviewed; EE BID, aztreonam TID per IV, LR @ 100 ml/hr per IV, Flagyl TID per IV, vancomycin per IV  Labs: reviewed; Glucose 101  NUTRITION - FOCUSED PHYSICAL EXAM: Unable to perform  Diet  Order:   Diet Order             Diet regular Room service appropriate? Yes; Fluid consistency: Thin  Diet effective now                  EDUCATION NEEDS:  Education needs have been addressed  Skin:  Skin Assessment: Reviewed RN Assessment  Last BM:  05/25/21  Height:  Ht Readings from Last 1 Encounters:  05/24/21 5\' 3"  (1.6 m)   Weight:  Wt Readings from Last 1 Encounters:  05/24/21 60.8 kg   Ideal Body Weight:  52.3 kg  BMI:  Body mass index is 23.74 kg/m.  Estimated Nutritional Needs:  Kcal:  1700-1900 Protein:  80-95 grams Fluid:  >1.7 L  Derrel Nip, RD, LDN (she/her/hers) Registered Dietitian I After-Hours/Weekend Pager # in [place]

## 2021-05-25 NOTE — Evaluation (Signed)
Physical Therapy Evaluation Patient Details Name: Michaela Rios MRN: [id number] DOB: [birth year] Today's Date: 05/25/2021   History of Present Illness  Pt is a 70 y.o. female admitted 05/24/21 with c/o weakness, fall due to loss of balance, poor PO intake. Workup for sepsis, unclear etiology. PMH includes HTN, CKD 3, migraines, chronic LBP, prediabetes, anxiety, depression.   Clinical Impression  Pt presents with an overall decrease in functional mobility secondary to above. PTA, pt independent, retired from work, lives with supportive husband. Today, pt required min guard for balance with transfers, ambulation and standing ADL tasks. Pt's husband present and supportive, able to assist pt as needed. Educ on precautions, activity recommendations, fall risk reduction; encouraged more frequent OOB mobility. Pt would benefit from continued acute PT services to maximize functional mobility and independence prior to d/c home.     Follow Up Recommendations No PT follow up;Supervision for mobility/OOB    Equipment Recommendations  None recommended by PT    Recommendations for Other Services       Precautions / Restrictions Precautions Precautions: Fall;Other (comment) Precaution Comments: urinary urgency/incontinence (has Depends in room) Restrictions Weight Bearing Restrictions: No      Mobility  Bed Mobility Overal bed mobility: Modified Independent                  Transfers Overall transfer level: Needs assistance Equipment used: None Transfers: Sit to/from Stand Sit to Stand: Min guard            Ambulation/Gait Ambulation/Gait assistance: Min guard Gait Distance (Feet): 150 Feet Assistive device: 1 person hand held assist;IV Pole;None Gait Pattern/deviations: Step-through pattern;Decreased stride length;Trunk flexed;Shuffle Gait velocity: Decreased   General Gait Details: Slow, mildly unsteady gait with initial HHA then IV pole for stability, progressing to  no DME with intermittent min guard for balance; very slow, guarded gait requiring cues to increase step length and speed; 1x LOB requiring UE support to correct; pt declined gait trial with RW  Stairs            Wheelchair Mobility    Modified Rankin (Stroke Patients Only)       Balance Overall balance assessment: Needs assistance   Sitting balance-Leahy Scale: Good       Standing balance-Leahy Scale: Fair Standing balance comment: Able to perform standing ADL tasks (pericare/washup at sink) and walk without UE support, guarded                             Pertinent Vitals/Pain Pain Assessment: No/denies pain    Home Living Family/patient expects to be discharged to:: Private residence Living Arrangements: Spouse/significant other Available Help at Discharge: Family;Available 24 hours/day Type of Home: House Home Access: Stairs to enter Entrance Stairs-Rails: Right Entrance Stairs-Number of Steps: 2-5 Home Layout: One level Home Equipment: Shower seat;Grab bars - tub/shower Additional Comments: Husband retired and available for 24/7 assist    Prior Function Level of Independence: Independent         Comments: Independent without DME. 1x fall that led to admission, otherwise no falls. retired from work. enjoys reading     Hand Dominance        Extremity/Trunk Assessment   Upper Extremity Assessment Upper Extremity Assessment: Generalized weakness    Lower Extremity Assessment Lower Extremity Assessment: Generalized weakness       Communication   Communication: No difficulties  Cognition Arousal/Alertness: Awake/alert Behavior During Therapy: Flat affect Overall Cognitive Status: Within Functional  Limits for tasks assessed                                 General Comments: [facility] for simple tasks; suspect apparent slowed processing more related to fatigue      General Comments General comments (skin integrity, edema,  etc.): Husband present and very supportive - encouraged pt and husband to have pt perform tasks as independently as possible; educ on activity recommendations    Exercises     Assessment/Plan    PT Assessment Patient needs continued PT services  PT Problem List Decreased strength;Decreased activity tolerance;Decreased balance;Decreased mobility;Decreased knowledge of use of DME;Cardiopulmonary status limiting activity       PT Treatment Interventions DME instruction;Gait training;Stair training;Functional mobility training;Therapeutic activities;Therapeutic exercise;Balance training;Patient/family education    PT Goals (Current goals can be found in the Care Plan section)  Acute Rehab PT Goals Patient Stated Goal: Get some rest PT Goal Formulation: With patient Time For Goal Achievement: 06/08/21 Potential to Achieve Goals: Good    Frequency Min 3X/week   Barriers to discharge        Co-evaluation               AM-PAC PT "6 Clicks" Mobility  Outcome Measure Help needed turning from your back to your side while in a flat bed without using bedrails?: None Help needed moving from lying on your back to sitting on the side of a flat bed without using bedrails?: None Help needed moving to and from a bed to a chair (including a wheelchair)?: A Little Help needed standing up from a chair using your arms (e.g., wheelchair or bedside chair)?: A Little Help needed to walk in hospital room?: A Little Help needed climbing 3-5 steps with a railing? : A Little 6 Click Score: 20    End of Session Equipment Utilized During Treatment: Gait belt Activity Tolerance: Patient tolerated treatment well Patient left: in bed;with call bell/phone within reach;with family/visitor present Nurse Communication: Mobility status;Other (comment) (husband able to assist with pt's mobility; RN ok with bed alarm off secondary to this) PT Visit Diagnosis: Other abnormalities of gait and mobility  (R26.89);Muscle weakness (generalized) (M62.81)    Time: 9509-3267 PT Time Calculation (min) (ACUTE ONLY): 34 min   Charges:   PT Evaluation $PT Eval Low Complexity: 1 Low PT Treatments $Therapeutic Activity: 8-22 mins      Mabeline Caras, PT, DPT Acute Rehabilitation Services  Pager [phone] Office [place] 05/25/2021, 1:04 PM

## 2021-05-25 NOTE — Progress Notes (Addendum)
PROGRESS NOTE    Michaela Rios  YNW:[id number] DOB: 1951-05-23 DOA: 05/24/2021 PCP: Binnie Rail, MD   Brief Narrative: 70 year old with past medical history significant for hypertension, prediabetes, depression, CKD stage III, history of PUD, chronic low back pain, migraine who presented to the ED with generalized weakness and loss of balance.  Patient fell on Sunday while trying to go to the bathroom and lost her balance.  She has been having shivering.  She denies cough, dysuria, abdominal pain.  She developed diarrhea overnight.  Patient was found to have high fever 101.4 on admission.  Elevated D-dimer.  CTA negative for PE.  Dopplers lower extremity negative for DVT. Admitted with SIRS criteria left unclear etiology   Assessment & Plan:   Principal Problem:   Sepsis ([place]) Active Problems:   Anxiety   Depression   Essential hypertension   Prediabetes   CKD (chronic kidney disease) stage 3, GFR 30-59 ml/min (HCC)   Hypokalemia   Tachycardia  1-SIRS;  Presents with fever, tachycardia, tachypnea,  mild elevation of lactic acid. Confusion.  Chest x ray negative for PNA.  UA; negative for UTI.  COVID PCR negative Follow urine culture and blood cultures If diarrhea persists may need C diff  if sample Plan for CT abdomen and pelvis No headaches other than her migraine, no nuchal rigidity.   Hypokalemia; replaced.  Hyponatremia; continue with IV fluids.  HTN;  Continue with Norvasc, hydralazine and metoprolol.//    Generalized weakness.  IV fluids.  PT , OT    Depression;  Continue with Wellbutrin, Effexor.   CKD stage III  Migraines; continue with topiromate.  Neuro exam non focal.   Pulmonary Nodule. Needs to follow up with PCP.  Positive D dimer; Doppler LE negative for DVT> CTA negative for PE>   Nutrition Problem: Inadequate oral intake Etiology: decreased appetite    Signs/Symptoms: per patient/family report, meal completion <  25%    Interventions: Ensure Enlive (each supplement provides 350kcal and 20 grams of protein), Magic cup, MVI  Estimated body mass index is 23.74 kg/m as calculated from the following:   Height as of this encounter: 5\' 3"  (1.6 m).   Weight as of this encounter: 60.8 kg.   DVT prophylaxis: Lovenox Code Status: Full code Family Communication: Husband who was at bedside 6/29 Disposition Plan:  Status is: Inpatient  Remains inpatient appropriate because:IV treatments appropriate due to intensity of illness or inability to take PO  Dispo: The patient is from: Home              Anticipated d/c is to:  to be determine              Patient currently is not medically stable to d/c.   Difficult to place patient No        Consultants:  None  Procedures:  none  Antimicrobials:  Vancomycin Aztreonam.   Subjective: She is alert, feels weak still, sluggish She denies abdominal pain, started to have loose stool last night, 2 episode so far.  She denies cough, dysuria, neck pain. No rashes.   Objective: Vitals:   05/25/21 0744 05/25/21 1037 05/25/21 1213 05/25/21 1550  BP: (!) 176/88 (!) 140/99 (!) 159/99 (!) 154/91  Pulse: 72 87 90 81  Resp: 20  18 18   Temp: 98.1 F (36.7 C)  97.6 F (36.4 C) 98.2 F (36.8 C)  TempSrc: Axillary  Axillary Oral  SpO2: 96% 96% 100% 99%  Weight:  Height:        Intake/Output Summary (Last 24 hours) at 05/25/2021 1737 Last data filed at 05/25/2021 1533 Gross per 24 hour  Intake 2465.1 ml  Output 1850 ml  Net 615.1 ml   Filed Weights   05/24/21 0956  Weight: 60.8 kg    Examination:  General exam: Appears calm and comfortable  Respiratory system: Clear to auscultation. Respiratory effort normal. Cardiovascular system: S1 & S2 heard, RRR. No JVD, murmurs, rubs, gallops or clicks. No pedal edema. Gastrointestinal system: Abdomen is nondistended, soft and nontender. No organomegaly or masses felt. Normal bowel sounds  heard. Central nervous system: Alert and oriented. No focal neurological deficits. Extremities: Symmetric 5 x 5 power.    Data Reviewed: I have personally reviewed following labs and imaging studies  CBC: Recent Labs  Lab 05/24/21 1005 05/25/21 0155  WBC 8.6 8.1  HGB 11.6* 10.5*  HCT 35.8* 32.9*  MCV 92.0 93.2  PLT 243 132   Basic Metabolic Panel: Recent Labs  Lab 05/24/21 1005 05/24/21 1940 05/25/21 0155  NA 139  --  137  K 2.8*  --  3.9  CL 105  --  106  CO2 22  --  24  GLUCOSE 121*  --  101*  BUN 9  --  5*  CREATININE 0.77  --  0.71  CALCIUM 9.3  --  8.7*  MG  --  2.3  --    GFR: Estimated Creatinine Clearance: 54.1 mL/min (by C-G formula based on SCr of 0.71 mg/dL). Liver Function Tests: Recent Labs  Lab 05/24/21 1005  AST 24  ALT 16  ALKPHOS 75  BILITOT 0.5  PROT 7.7  ALBUMIN 3.4*   No results for input(s): LIPASE, AMYLASE in the last 168 hours. No results for input(s): AMMONIA in the last 168 hours. Coagulation Profile: Recent Labs  Lab 05/24/21 1150  INR 1.1   Cardiac Enzymes: No results for input(s): CKTOTAL, CKMB, CKMBINDEX, TROPONINI in the last 168 hours. BNP (last 3 results) No results for input(s): PROBNP in the last 8760 hours. HbA1C: No results for input(s): HGBA1C in the last 72 hours. CBG: No results for input(s): GLUCAP in the last 168 hours. Lipid Profile: No results for input(s): CHOL, HDL, LDLCALC, TRIG, CHOLHDL, LDLDIRECT in the last 72 hours. Thyroid Function Tests: No results for input(s): TSH, T4TOTAL, FREET4, T3FREE, THYROIDAB in the last 72 hours. Anemia Panel: No results for input(s): VITAMINB12, FOLATE, FERRITIN, TIBC, IRON, RETICCTPCT in the last 72 hours. Sepsis Labs: Recent Labs  Lab 05/24/21 1051 05/24/21 1227 05/24/21 1940  PROCALCITON  --   --  <0.10  LATICACIDVEN 2.1* 1.5  --     Recent Results (from the past 240 hour(s))  Blood culture (routine x 2)     Status: None (Preliminary result)   Collection  Time: 05/24/21 10:51 AM   Specimen: BLOOD  Result Value Ref Range Status   Specimen Description BLOOD RIGHT ANTECUBITAL  Final   Special Requests   Final    BOTTLES DRAWN AEROBIC AND ANAEROBIC Blood Culture adequate volume   Culture   Final    NO GROWTH < 24 HOURS Performed at [facility], [address] [place], [place] [address]    Report Status PENDING  Incomplete  Resp Panel by RT-PCR (Flu A&B, Covid) Nasopharyngeal Swab     Status: None   Collection Time: 05/24/21 11:14 AM   Specimen: Nasopharyngeal Swab; Nasopharyngeal(NP) swabs in vial transport medium  Result Value Ref Range Status  SARS Coronavirus 2 by RT PCR NEGATIVE NEGATIVE Final    Comment: (NOTE) SARS-CoV-2 target nucleic acids are NOT DETECTED.  The SARS-CoV-2 RNA is generally detectable in upper respiratory specimens during the acute phase of infection. The lowest concentration of SARS-CoV-2 viral copies this assay can detect is 138 copies/mL. A negative result does not preclude SARS-Cov-2 infection and should not be used as the sole basis for treatment or other patient management decisions. A negative result may occur with  improper specimen collection/handling, submission of specimen other than nasopharyngeal swab, presence of viral mutation(s) within the areas targeted by this assay, and inadequate number of viral copies(<138 copies/mL). A negative result must be combined with clinical observations, patient history, and epidemiological information. The expected result is Negative.  Fact Sheet for Patients:  EntrepreneurPulse.com.au  Fact Sheet for Healthcare Providers:  IncredibleEmployment.be  This test is no t yet approved or cleared by the [place] FDA and  has been authorized for detection and/or diagnosis of SARS-CoV-2 by FDA under an Emergency Use Authorization (EUA). This EUA will remain  in effect (meaning this test can be used) for the duration of  the COVID-19 declaration under Section 564(b)(1) of the Act, 21 U.S.C.section 360bbb-3(b)(1), unless the authorization is terminated  or revoked sooner.       Influenza A by PCR NEGATIVE NEGATIVE Final   Influenza B by PCR NEGATIVE NEGATIVE Final    Comment: (NOTE) The Xpert Xpress SARS-CoV-2/FLU/RSV plus assay is intended as an aid in the diagnosis of influenza from Nasopharyngeal swab specimens and should not be used as a sole basis for treatment. Nasal washings and aspirates are unacceptable for Xpert Xpress SARS-CoV-2/FLU/RSV testing.  Fact Sheet for Patients: EntrepreneurPulse.com.au  Fact Sheet for Healthcare Providers: IncredibleEmployment.be  This test is not yet approved or cleared by the [place] FDA and has been authorized for detection and/or diagnosis of SARS-CoV-2 by FDA under an Emergency Use Authorization (EUA). This EUA will remain in effect (meaning this test can be used) for the duration of the COVID-19 declaration under Section 564(b)(1) of the Act, 21 U.S.C. section 360bbb-3(b)(1), unless the authorization is terminated or revoked.  Performed at [facility], [place] [address], [place], [place] [address]   Blood culture (routine x 2)     Status: None (Preliminary result)   Collection Time: 05/24/21 12:03 PM   Specimen: BLOOD  Result Value Ref Range Status   Specimen Description BLOOD LEFT ANTECUBITAL  Final   Special Requests   Final    BOTTLES DRAWN AEROBIC AND ANAEROBIC Blood Culture adequate volume   Culture   Final    NO GROWTH < 24 HOURS Performed at [facility], [place] [address] [place], [place] [address]    Report Status PENDING  Incomplete  Urine culture     Status: None   Collection Time: 05/24/21 12:48 PM   Specimen: Urine, Clean Catch  Result Value Ref Range Status   Specimen Description URINE, CLEAN CATCH  Final   Special Requests NONE  Final   Culture   Final    NO GROWTH Performed  at [facility], [place] [address], [place], [place] [address]    Report Status 05/25/2021 FINAL  Final         Radiology Studies: DG Chest 2 View  Result Date: 05/24/2021 CLINICAL DATA:  Fever and urinary frequency EXAM: CHEST - 2 VIEW COMPARISON:  08/17/2020 FINDINGS: The heart size and mediastinal contours are within normal limits. Both  lungs are clear. The visualized skeletal structures are unremarkable. IMPRESSION: No active cardiopulmonary disease. Electronically Signed   By: Franchot Gallo M.D.   On: 05/24/2021 14:41   CT Angio Chest PE W/Cm &/Or Wo Cm  Result Date: 05/24/2021 CLINICAL DATA:  Elevated D-dimer, tachycardia EXAM: CT ANGIOGRAPHY CHEST WITH CONTRAST TECHNIQUE: Multidetector CT imaging of the chest was performed using the standard protocol during bolus administration of intravenous contrast. Multiplanar CT image reconstructions and MIPs were obtained to evaluate the vascular anatomy. CONTRAST:  83mL OMNIPAQUE IOHEXOL 350 MG/ML SOLN COMPARISON:  None. FINDINGS: Cardiovascular: Normal heart size. No pericardial effusion. Thoracic aorta is normal in caliber minor calcified plaque. Satisfactory opacification of the pulmonary arteries to the proximal segmental level. No evidence of acute pulmonary embolism. Mediastinum/Nodes: No enlarged mediastinal or hilar lymph nodes. Mildly prominent but nonenlarged axillary and subpectoral lymph nodes. Lungs/Pleura: No pleural effusion or pneumothorax. There is a 4 mm peripheral nodular opacity of the right middle lobe (series 7, image 58). Upper Abdomen: No acute abnormality. Postoperative changes at the gastroesophageal junction. Musculoskeletal: Degenerative changes of the spine. No acute osseous abnormality. Review of the MIP images confirms the above findings. IMPRESSION: No evidence of acute pulmonary embolism or other acute abnormality. 4 mm right middle lobe peripheral nodular opacity. No follow-up needed if patient is low-risk.  Non-contrast chest CT can be considered in 12 months if patient is high-risk. This recommendation follows the consensus statement: Guidelines for Management of Incidental Pulmonary Nodules Detected on CT Images: From the Fleischner Society 2017; Radiology 2017; 284:228-243. Electronically Signed   By: Macy Mis M.D.   On: 05/24/2021 16:41   CT Renal Stone Study  Result Date: 05/24/2021 CLINICAL DATA:  Flank pain, urinary frequency EXAM: CT ABDOMEN AND PELVIS WITHOUT CONTRAST TECHNIQUE: Multidetector CT imaging of the abdomen and pelvis was performed following the standard protocol without IV contrast. COMPARISON:  None available FINDINGS: Lower chest: No acute abnormality. Hepatobiliary: Limited without IV contrast. Punctate hepatic dome calcified granuloma anteriorly. No large focal hepatic abnormality intrahepatic biliary dilatation. Gallbladder unremarkable. Common bile duct nondilated. Pancreas: Unremarkable. No pancreatic ductal dilatation or surrounding inflammatory changes. Spleen: Normal in size without focal abnormality. Adrenals/Urinary Tract: Adrenal glands are unremarkable. Kidneys are normal, without renal calculi, focal lesion, or hydronephrosis. Bladder is unremarkable. Stomach/Bowel: Negative for bowel obstruction, significant dilatation, ileus, or free air. Normal appendix demonstrated. No free fluid, fluid collection, hemorrhage, hematoma abscess, ascites. Vascular/Lymphatic: Minor aortic atherosclerosis and tortuosity. Negative for aneurysm. Limited assessment without IV contrast. Mild bilateral external iliac adenopathy, nonspecific. Left external iliac has a short axis diameter 1.3 cm. No inguinal adenopathy. No significant retroperitoneal adenopathy. Reproductive: Remote hysterectomy. Calcifications noted in the pelvis appearing benign or postoperative. No adnexal abnormality. No pelvic free fluid. Other: Small fat containing umbilical hernia. No large ventral hernia. No inguinal  hernia. Negative for ascites. Musculoskeletal: Thoracolumbar degenerative changes. Lower lumbar facet arthropathy. No acute osseous finding. IMPRESSION: No acute obstructing urinary tract or ureteral calculus. No obstructive uropathy or hydronephrosis. No other acute intra-abdominal or pelvic finding by noncontrast CT. Nonspecific bilateral mild external iliac adenopathy, short axis diameters measure up to 1.3 cm on the left. Aortic Atherosclerosis (ICD10-I70.0). Electronically Signed   By: Jerilynn Mages.  Shick M.D.   On: 05/24/2021 11:47   VAS [place] LOWER EXTREMITY VENOUS (DVT)  Result Date: 05/25/2021  Lower Venous DVT Study Patient Name:  SARABELLE GENSON [facility]  Date of Exam:   05/25/2021 Medical Rec #: [id number]  Accession #:    [id number] Date of Birth: 03/21/1951          Patient Gender: F Patient Age:   69Y Exam Location:  [facility] Procedure:      VAS [place] LOWER EXTREMITY VENOUS (DVT) Referring Phys: [id number] Cadon Raczka A Manuella Blackson --------------------------------------------------------------------------------  Indications: Edema.  Comparison Study: no prior Performing Technologist: Archie Patten RVS  Examination Guidelines: A complete evaluation includes B-mode imaging, spectral Doppler, color Doppler, and power Doppler as needed of all accessible portions of each vessel. Bilateral testing is considered an integral part of a complete examination. Limited examinations for reoccurring indications may be performed as noted. The reflux portion of the exam is performed with the patient in reverse Trendelenburg.  +---------+---------------+---------+-----------+----------+--------------+ RIGHT    CompressibilityPhasicitySpontaneityPropertiesThrombus Aging +---------+---------------+---------+-----------+----------+--------------+ CFV      Full           Yes      Yes                                 +---------+---------------+---------+-----------+----------+--------------+ SFJ      Full                                                         +---------+---------------+---------+-----------+----------+--------------+ FV Prox  Full                                                        +---------+---------------+---------+-----------+----------+--------------+ FV Mid   Full                                                        +---------+---------------+---------+-----------+----------+--------------+ FV DistalFull                                                        +---------+---------------+---------+-----------+----------+--------------+ PFV      Full                                                        +---------+---------------+---------+-----------+----------+--------------+ POP      Full           Yes      Yes                                 +---------+---------------+---------+-----------+----------+--------------+ PTV      Full                                                        +---------+---------------+---------+-----------+----------+--------------+  PERO     Full                                                        +---------+---------------+---------+-----------+----------+--------------+   +---------+---------------+---------+-----------+----------+--------------+ LEFT     CompressibilityPhasicitySpontaneityPropertiesThrombus Aging +---------+---------------+---------+-----------+----------+--------------+ CFV      Full           Yes      Yes                                 +---------+---------------+---------+-----------+----------+--------------+ SFJ      Full                                                        +---------+---------------+---------+-----------+----------+--------------+ FV Prox  Full                                                        +---------+---------------+---------+-----------+----------+--------------+ FV Mid   Full                                                         +---------+---------------+---------+-----------+----------+--------------+ FV DistalFull                                                        +---------+---------------+---------+-----------+----------+--------------+ PFV      Full                                                        +---------+---------------+---------+-----------+----------+--------------+ POP      Full           Yes      Yes                                 +---------+---------------+---------+-----------+----------+--------------+ PTV      Full                                                        +---------+---------------+---------+-----------+----------+--------------+ PERO     Full                                                        +---------+---------------+---------+-----------+----------+--------------+  Summary: BILATERAL: - No evidence of deep vein thrombosis seen in the lower extremities, bilaterally. -No evidence of popliteal cyst, bilaterally.   *See table(s) above for measurements and observations. Electronically signed by Deitra Mayo MD on 05/25/2021 at 2:52:23 PM.    Final         Scheduled Meds:  amLODipine  5 mg Oral q AM   buPROPion  300 mg Oral Daily   enoxaparin (LOVENOX) injection  40 mg Subcutaneous Q24H   feeding supplement  237 mL Oral BID BM   hydrALAZINE  25 mg Oral BID   metoprolol succinate  100 mg Oral Daily   multivitamin with minerals  1 tablet Oral Daily   topiramate  150 mg Oral QHS   venlafaxine XR  75 mg Oral Q breakfast   Continuous Infusions:  aztreonam 2 g (05/25/21 0442)   lactated ringers Stopped (05/25/21 1315)   metronidazole Stopped (05/25/21 1256)   vancomycin       LOS: 1 day    Time spent: 35 minutes.     Elmarie Shiley, MD [facility]   If 7PM-7AM, please contact night-coverage [email or web address]  05/25/2021, 5:37 PM

## 2021-05-25 NOTE — Progress Notes (Signed)
Lower extremity venous has been completed.   Preliminary results in CV Proc.   Abram Sander 05/25/2021 2:47 PM

## 2021-05-26 ENCOUNTER — Inpatient Hospital Stay (HOSPITAL_COMMUNITY): Payer: Medicare HMO

## 2021-05-26 DIAGNOSIS — I6389 Other cerebral infarction: Secondary | ICD-10-CM

## 2021-05-26 DIAGNOSIS — I639 Cerebral infarction, unspecified: Secondary | ICD-10-CM

## 2021-05-26 LAB — COMPREHENSIVE METABOLIC PANEL
ALT: 13 U/L (ref 0–44)
AST: 25 U/L (ref 15–41)
Albumin: 2.9 g/dL — ABNORMAL LOW (ref 3.5–5.0)
Alkaline Phosphatase: 54 U/L (ref 38–126)
Anion gap: 11 (ref 5–15)
BUN: 8 mg/dL (ref 8–23)
CO2: 18 mmol/L — ABNORMAL LOW (ref 22–32)
Calcium: 8.9 mg/dL (ref 8.9–10.3)
Chloride: 103 mmol/L (ref 98–111)
Creatinine, Ser: 0.7 mg/dL (ref 0.44–1.00)
GFR, Estimated: >60 mL/min (ref >60)
Glucose, Bld: 94 mg/dL (ref 70–99)
Potassium: 3.6 mmol/L (ref 3.5–5.1)
Sodium: 132 mmol/L — ABNORMAL LOW (ref 135–145)
Total Bilirubin: 0.6 mg/dL (ref 0.3–1.2)
Total Protein: 6.4 g/dL — ABNORMAL LOW (ref 6.5–8.1)

## 2021-05-26 LAB — CBC
HCT: 34.3 % — ABNORMAL LOW (ref 36.0–46.0)
Hemoglobin: 11.1 g/dL — ABNORMAL LOW (ref 12.0–15.0)
MCH: 29.4 pg (ref 26.0–34.0)
MCHC: 32.4 g/dL (ref 30.0–36.0)
MCV: 91 fL (ref 80.0–100.0)
Platelets: 221 10*3/uL (ref 150–400)
RBC: 3.77 MIL/uL — ABNORMAL LOW (ref 3.87–5.11)
RDW: 15.1 % (ref 11.5–15.5)
WBC: 6.1 10*3/uL (ref 4.0–10.5)
nRBC: 0 % (ref 0.0–0.2)

## 2021-05-26 LAB — ECHOCARDIOGRAM COMPLETE
AR max vel: 2.96 cm2
AV Area VTI: 2.97 cm2
AV Area mean vel: 2.8 cm2
AV Mean grad: 3 mmHg
AV Peak grad: 5.8 mmHg
Ao pk vel: 1.2 m/s
Area-P 1/2: 3.68 cm2
Height: 63 in
MV VTI: 3.39 cm2
S' Lateral: 2.2 cm
Weight: 2144 oz

## 2021-05-26 LAB — TSH
TSH: 1.413 u[IU]/mL (ref 0.350–4.500)
TSH: 1.904 u[IU]/mL (ref 0.350–4.500)

## 2021-05-26 LAB — VITAMIN B12: Vitamin B-12: 254 pg/mL (ref 180–914)

## 2021-05-26 LAB — VITAMIN D 25 HYDROXY (VIT D DEFICIENCY, FRACTURES): Vit D, 25-Hydroxy: 64.73 ng/mL (ref 30–100)

## 2021-05-26 IMAGING — MR MR HEAD W/ CM
4 of 5 series · 13 of 48 positions shown · IV contrast (6.5 M GAD)
Comparison: Noncontrast brain MRI earlier today

CLINICAL DATA: Follow-up abnormal brain MRI earlier today.

EXAM:
MRI HEAD WITH CONTRAST
TECHNIQUE: Multiplanar, multiecho pulse sequences of the brain and surrounding
structures were obtained with intravenous contrast.
CONTRAST:  6.5mL GADAVIST GADOBUTROL 1 MMOL/ML IV SOLN

[Series 3: T2 post-contrast · coronal · 5.0mm · 0.20mm/px · 3 of 31 slices shown]
[im 5/31]
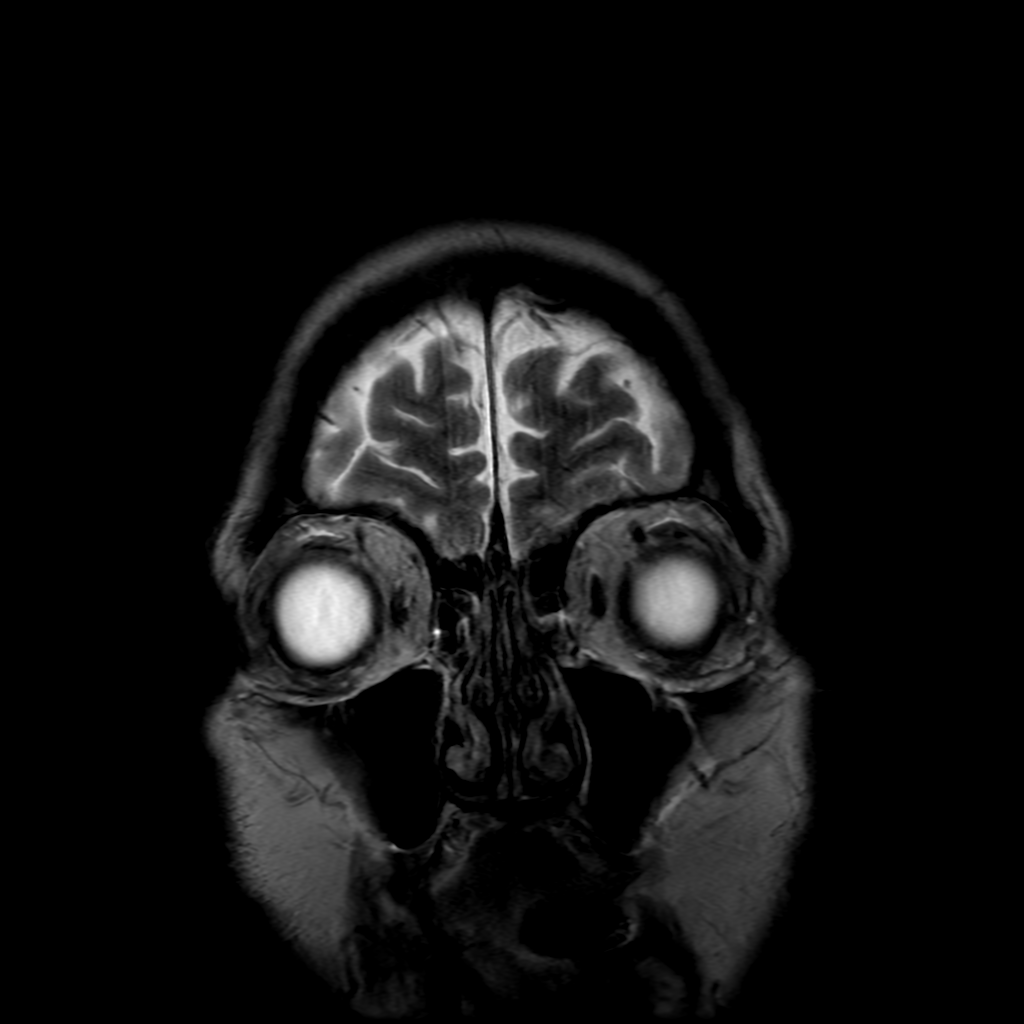
[im 18/31]
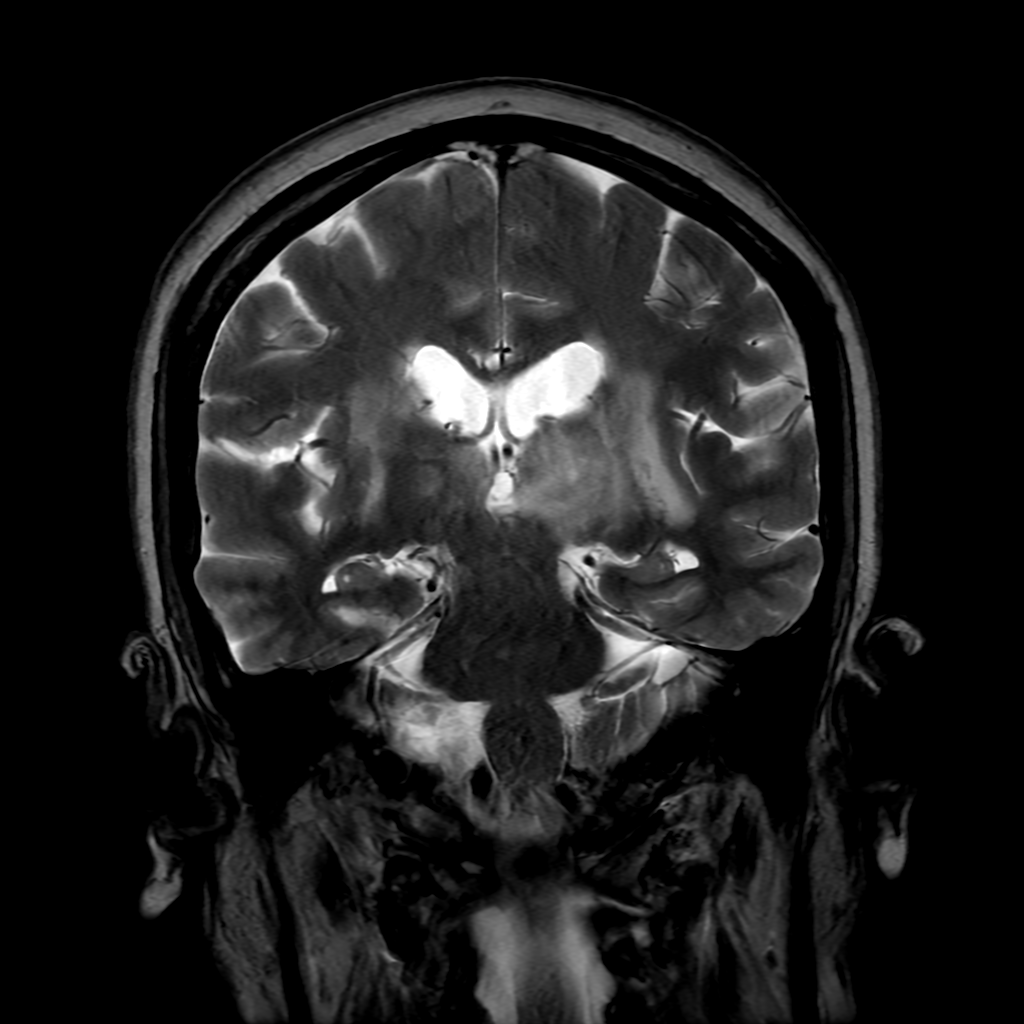
[im 26/31]
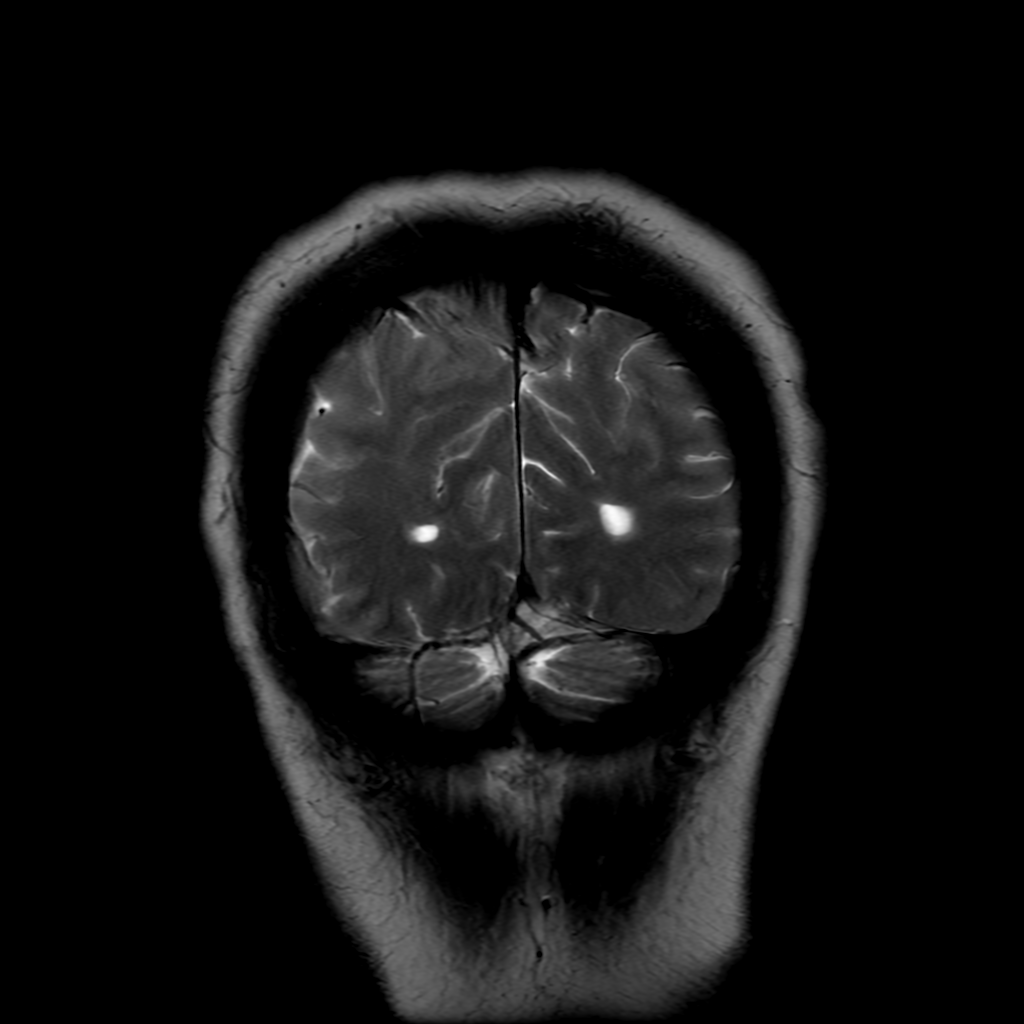

[Series 4: T1 · axial · 3.0mm · 0.94mm/px · z∈[-63,+35]mm · 3 of 52 slices shown (1 of 2)]
[im 9/52]
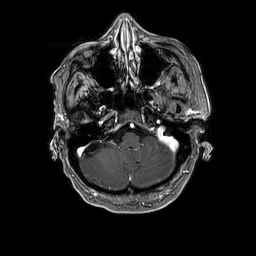
[im 26/52]
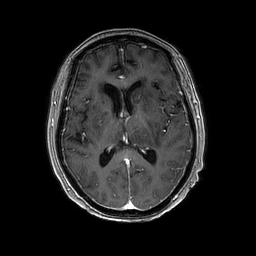
[im 43/52]
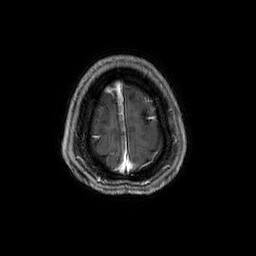

[Series 5: T1 · coronal · 5.0mm · 0.39mm/px · 3 of 31 slices shown (2 of 2)]
[im 5/31]
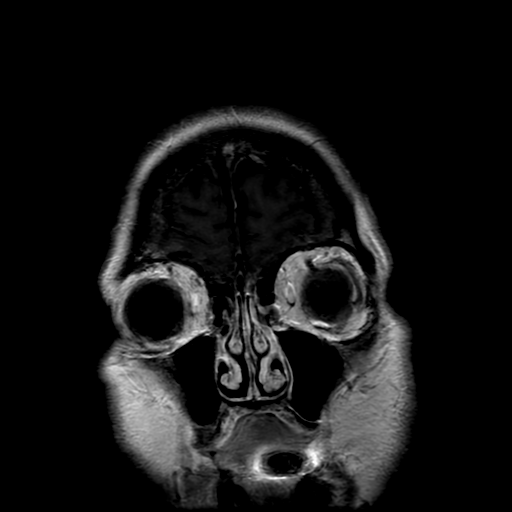
[im 18/31]
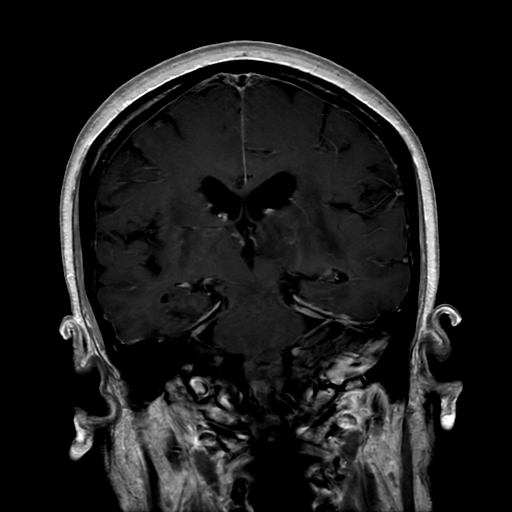
[im 26/31]
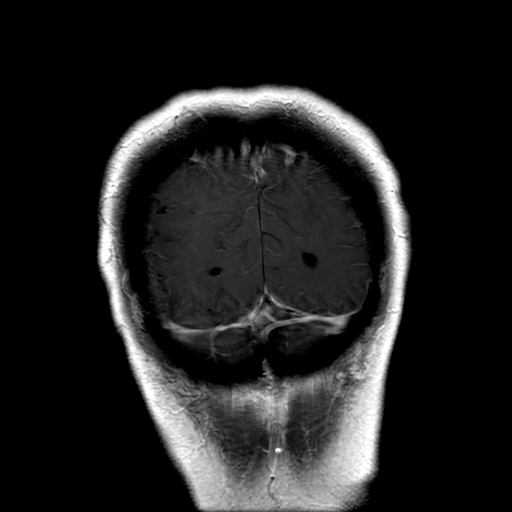

[Series 6: FLAIR post-contrast · sagittal · 5.0mm · 0.47mm/px · 4 of 24 slices shown]
[im 1/24]
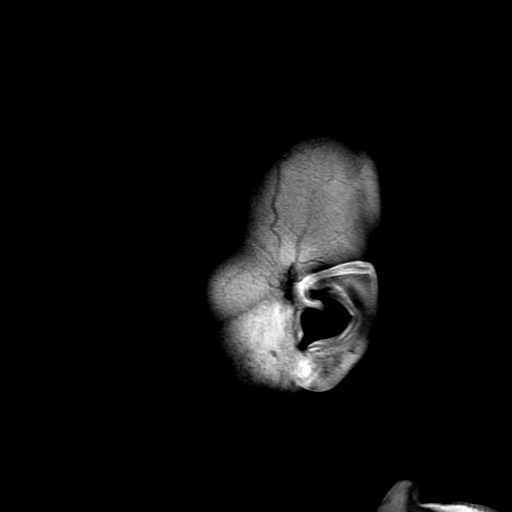
[im 5/24]
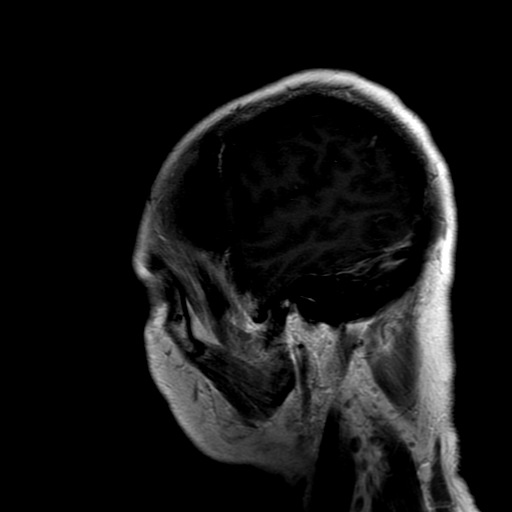
[im 14/24]
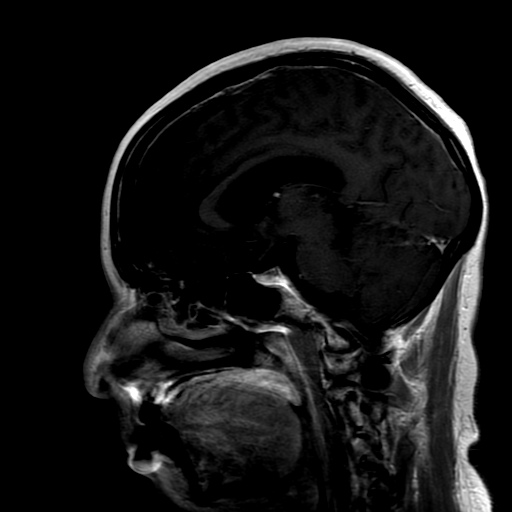
[im 24/24]
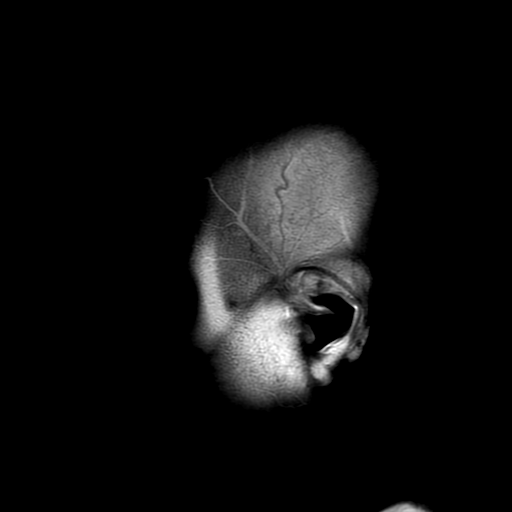

[13 of 48 positions shown; findings below may reference images not displayed]

FINDINGS: There is very mild enhancement within the previously seen regions of
signal abnormality in the basal ganglia and thalami. Much of this
enhancement appears vascular and perivascular. No solid enhancing
mass is present. There is focal abnormal leptomeningeal enhancement
over the right parietal convexity (series 6, image 6), and there may
also be minimal leptomeningeal enhancement over the left parietal
convexity (series 6, image 19).
IMPRESSION: Mild, largely vascular/perivascular appearing enhancement in the
areas of signal abnormality in the deep gray nuclei as well as a
small amount of leptomeningeal enhancement over the right and
possibly left parietal convexities. No solid enhancing mass.

Differential diagnosis includes infection (including cryptococcus),
vasculitis, unusual neoplasm (such as angiocentric lymphoma), and
granulomatous/autoimmune/other inflammatory conditions.

## 2021-05-26 IMAGING — MR MR HEAD W/O CM
6 of 11 series · 24 of 48 positions shown · non-contrast
Comparison: CT head [DATE], [DATE]

CLINICAL DATA: Acute neuro deficit.  Abnormal CT.  Fever

EXAM:
MRI HEAD WITHOUT CONTRAST
TECHNIQUE: Multiplanar, multiecho pulse sequences of the brain and surrounding
structures were obtained without intravenous contrast.

[Series 2: DWI · axial · 3.0mm · 0.94mm/px · z∈[-69,+67]mm · 7 of 100 slices shown (1 of 2)]
[im 1/100]
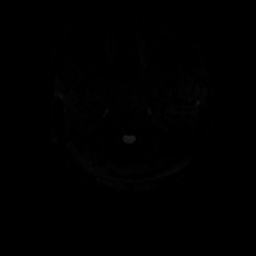
[im 17/100]
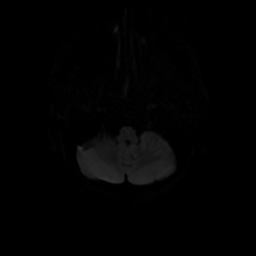
[im 34/100]
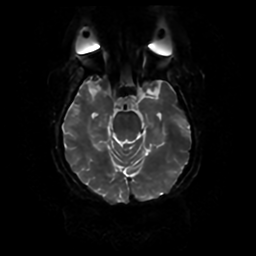
[im 50/100]
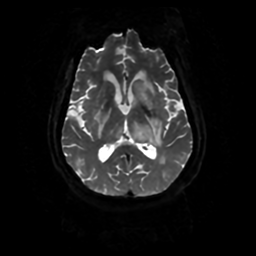
[im 67/100]
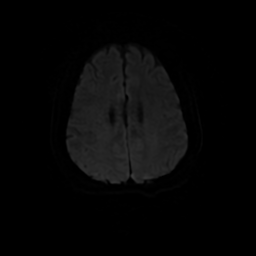
[im 83/100]
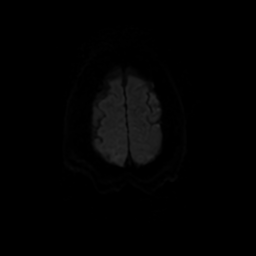
[im 100/100]
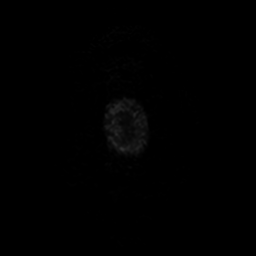

[Series 3: DWI · coronal · 4.0mm · 0.94mm/px · 5 of 74 slices shown (2 of 2)]
[im 1/74]
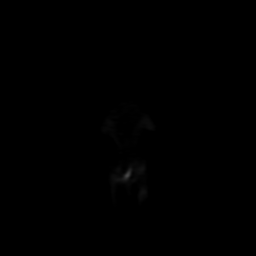
[im 19/74]
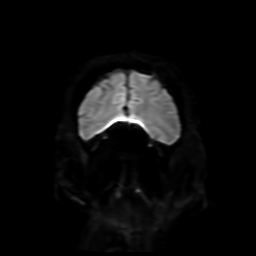
[im 37/74]
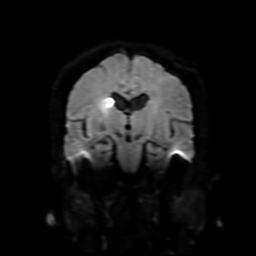
[im 55/74]
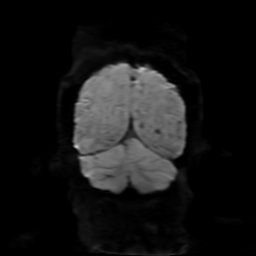
[im 74/74]
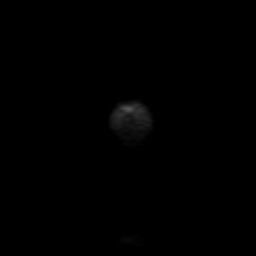

[Series 4: FLAIR · sagittal · 5.0mm · 0.23mm/px · 2 of 25 slices shown (1 of 2)]
[im 1/25]
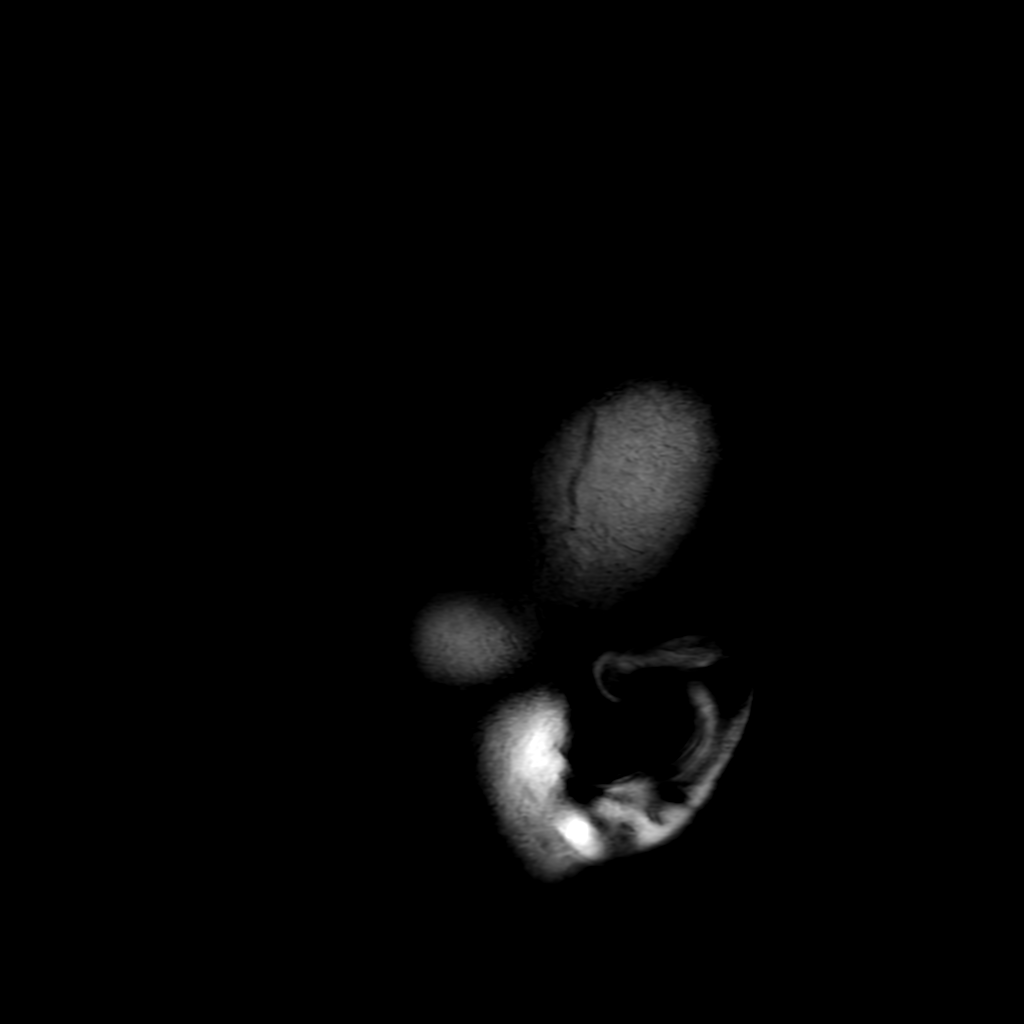
[im 25/25]
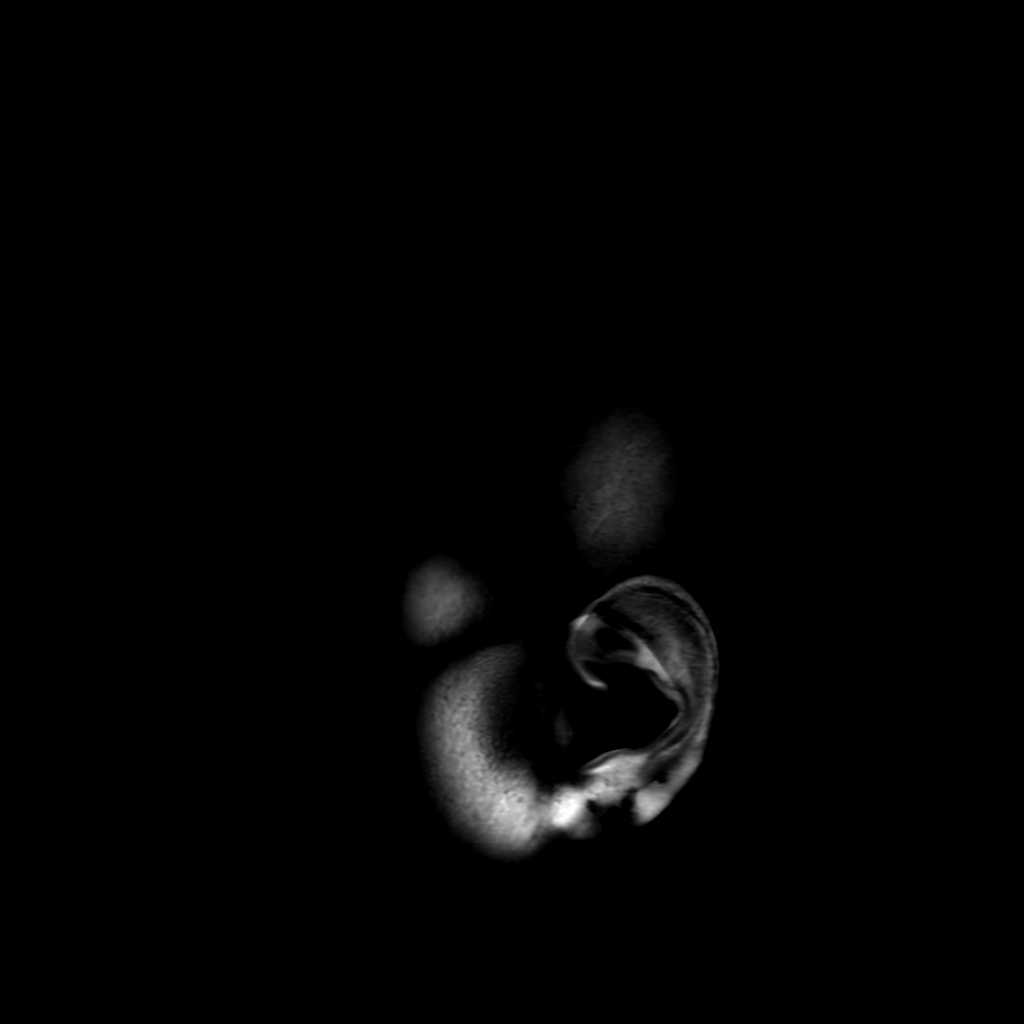

[Series 6: FLAIR · axial · 4.0mm · 0.45mm/px · z∈[-70,+76]mm · 3 of 36 slices shown (2 of 2)]
[im 1/36]
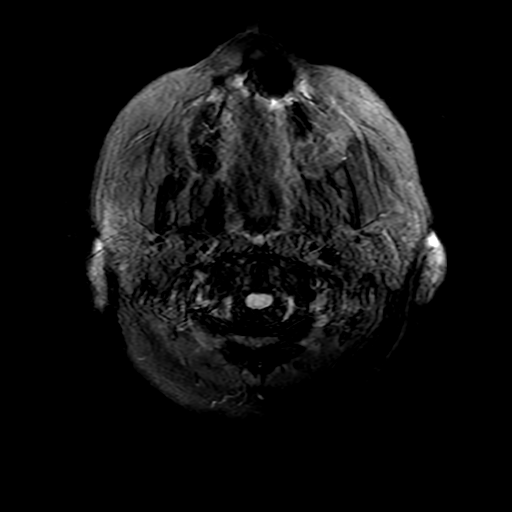
[im 18/36]
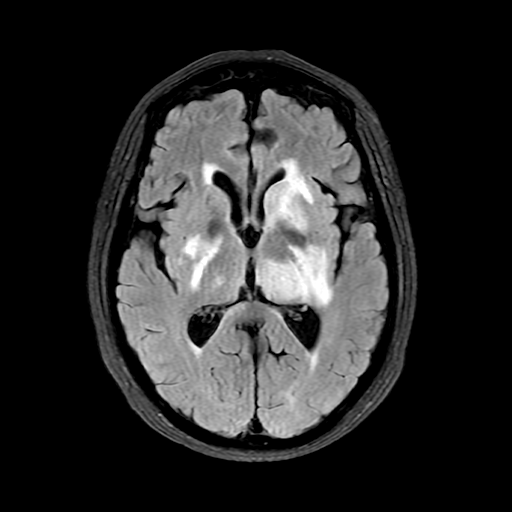
[im 36/36]
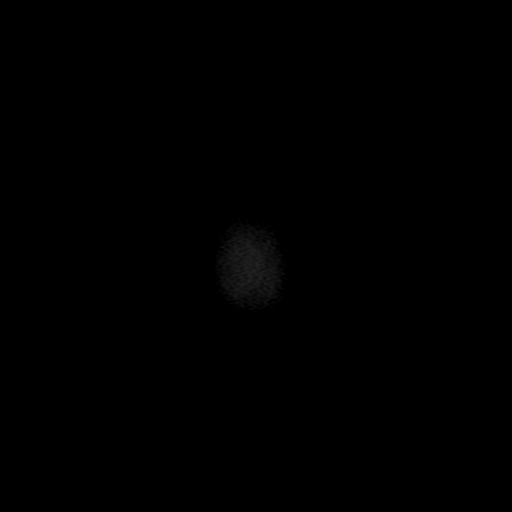

[Series 250: ADC · axial · 3.0mm · 0.94mm/px · z∈[-69,+67]mm · 4 of 50 slices shown (1 of 2)]
[im 1/50]
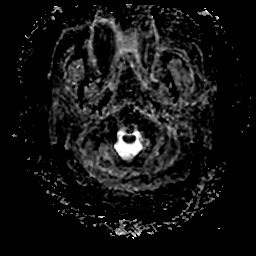
[im 17/50]
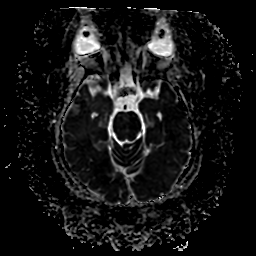
[im 33/50]
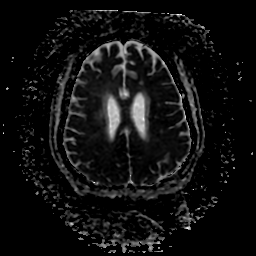
[im 50/50]
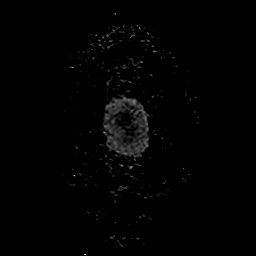

[Series 350: ADC · coronal · 4.0mm · 0.94mm/px · 3 of 37 slices shown (2 of 2)]
[im 1/37]
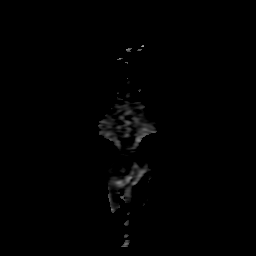
[im 19/37]
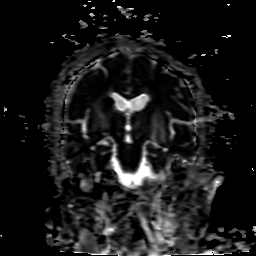
[im 37/37]
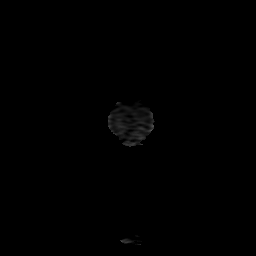

[24 of 48 positions shown; findings below may reference images not displayed]

FINDINGS: Brain: Focal area of restricted diffusion in the right
lenticulostriate distribution compatible with acute infarct. This
area also shows increased signal on T2 and FLAIR. Mild edema in the
deep white matter tracts on the right. Small amount of FLAIR
hyperintensity right medial thalamus.

Additional areas of increased signal on T2 and FLAIR involving the
head of caudate on the left and left thalamus without restricted
diffusion. There is mild enlargement of the head of the caudate on
the left. There is also asymmetric white matter hyperintensity in
the deep white matter tracks of the left external capsule and left
internal capsule. These areas do not show restricted diffusion.

Ventricle size normal.  No midline shift.  Negative for hemorrhage

Vascular: Normal arterial flow voids.

Skull and upper cervical spine: Negative

Sinuses/Orbits: Minimal mucosal edema paranasal sinuses. Negative
orbit

Other: None
IMPRESSION: Restricted diffusion in the right basal ganglia corresponding to
lenticulostriate territory. This is most consistent with acute
infarction

Hyperintensity and mild enlargement of the left head of caudate.
Hyperintensity also in the left thalamus and a small amount of
hyperintensity in the right medial thalamus. There is vasogenic type
edema in the deep white matter structures on the left and to lesser
extent in the deep white matter structures on the right. No
restricted diffusion on the left.

This is an unusual pattern. I would recommend postcontrast imaging
of the brain to rule out tumor on the left. Subacute ischemia on the
left appears unlikely given the vasogenic edema and unusual
appearance of the head of the caudate and thalamus on the left. Also
consider infection/cerebritis. Opportunistic infection such as
toxoplasmosis possible.

These results were called by telephone at the time of interpretation
on [DATE] at [DATE] to provider RAMPERSAD, who verbally
acknowledged these results.

## 2021-05-26 IMAGING — CT CT HEAD W/O CM
3 series · 15 of 47 positions shown, 18 images · non-contrast
Comparison: [DATE]

CLINICAL DATA: Headache.

EXAM:
CT HEAD WITHOUT CONTRAST
TECHNIQUE: Contiguous axial images were obtained from the base of the skull
through the vertex without intravenous contrast.

[Series 3: head 5.0 h30s · axial · 0.44mm/px · z∈[+1213,+1338]mm · 9 of 31 slices shown, 12 images]
[im 3/31  brain]
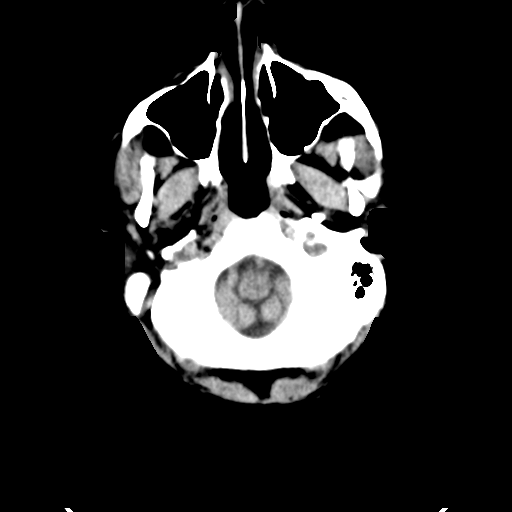
[im 3/31  bone]
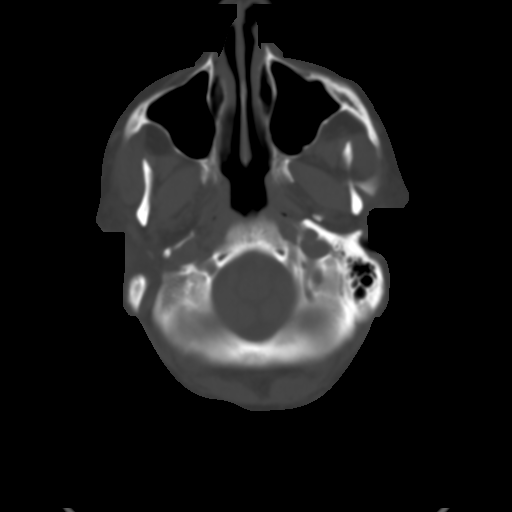
[im 6/31  brain]
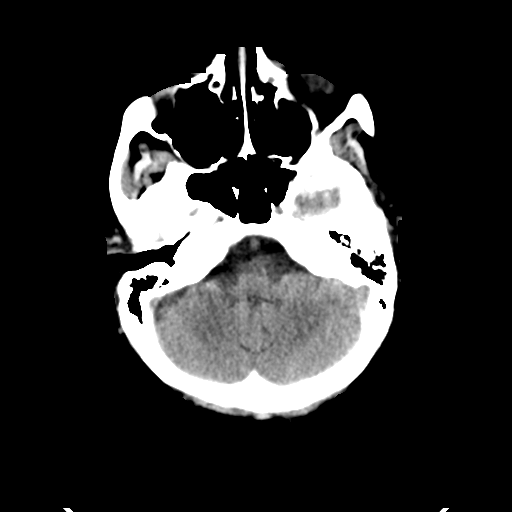
[im 9/31  brain]
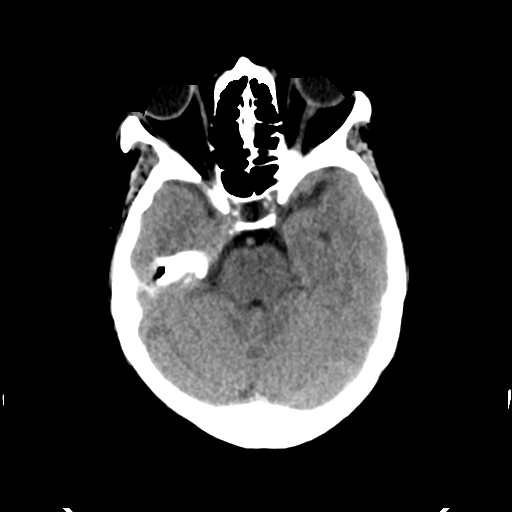
[im 12/31  brain]
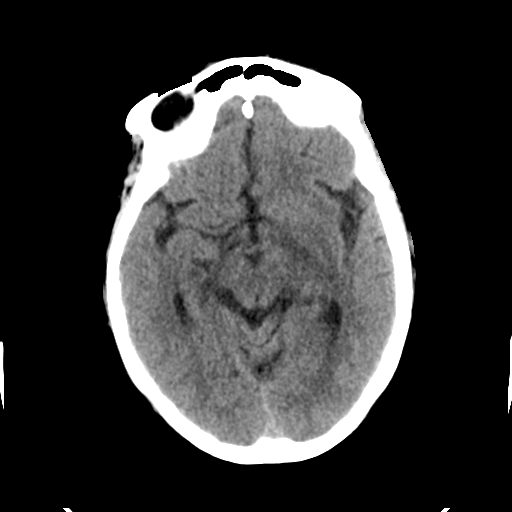
[im 16/31  brain]
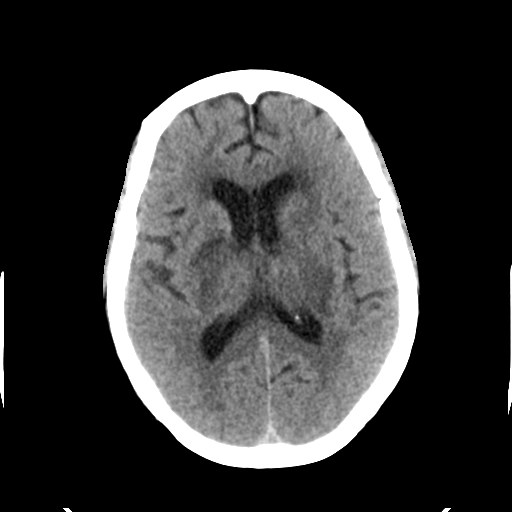
[im 16/31  bone]
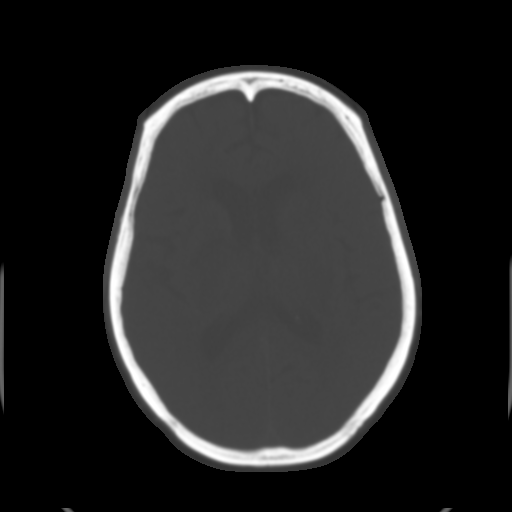
[im 19/31  brain]
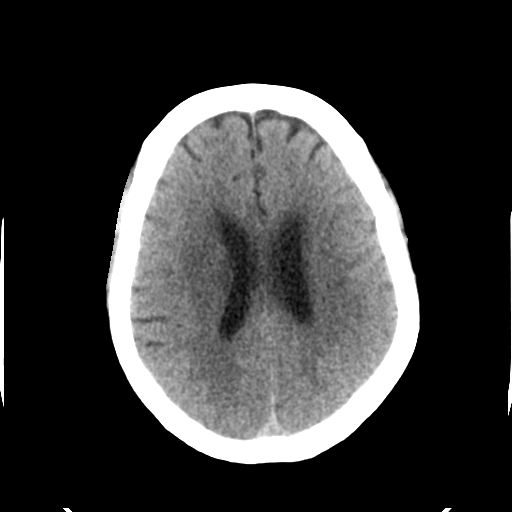
[im 22/31  brain]
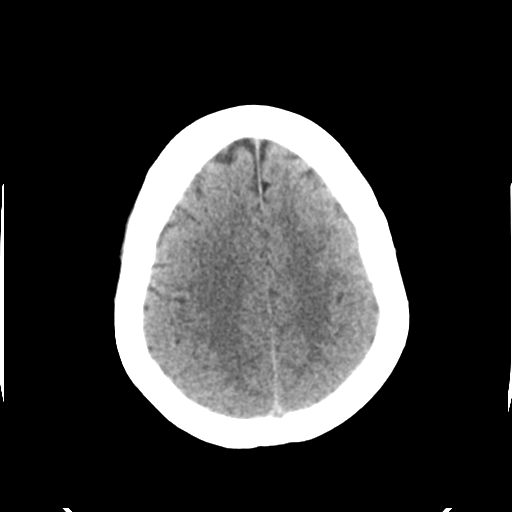
[im 25/31  brain]
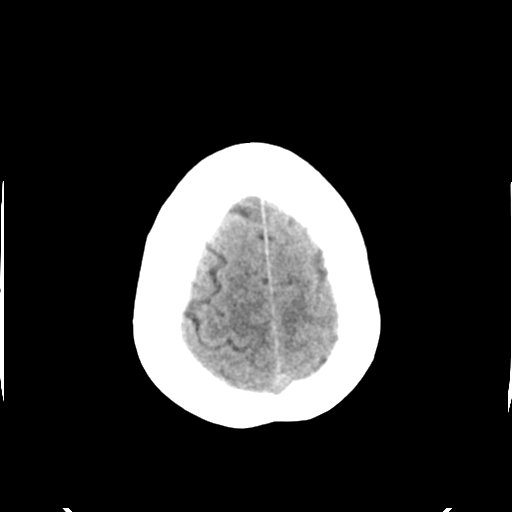
[im 28/31  brain]
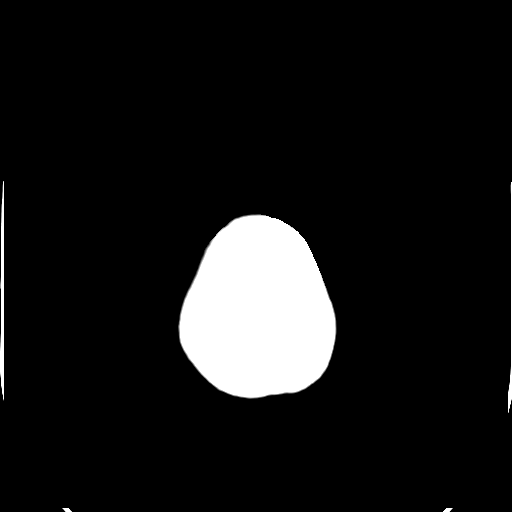
[im 28/31  bone]
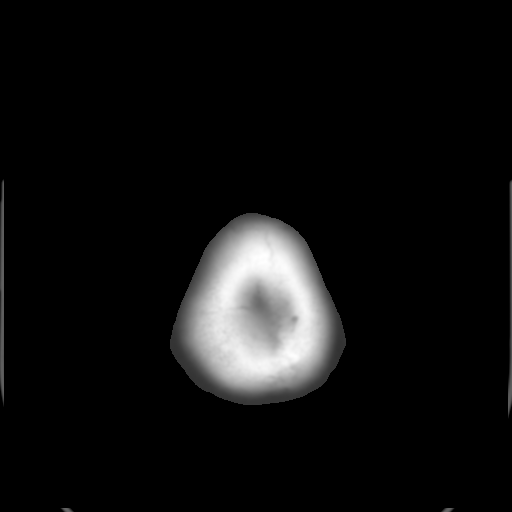

[Series 5: head 3.0 mpr cor · coronal · 0.33mm/px · 3 of 67 slices shown]
[im 23/67  brain]
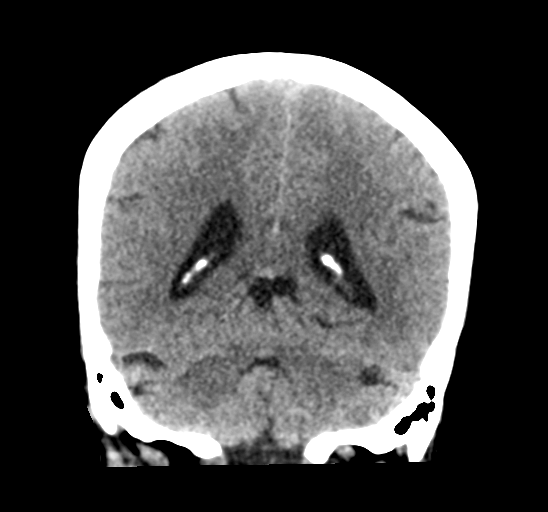
[im 30/67  brain]
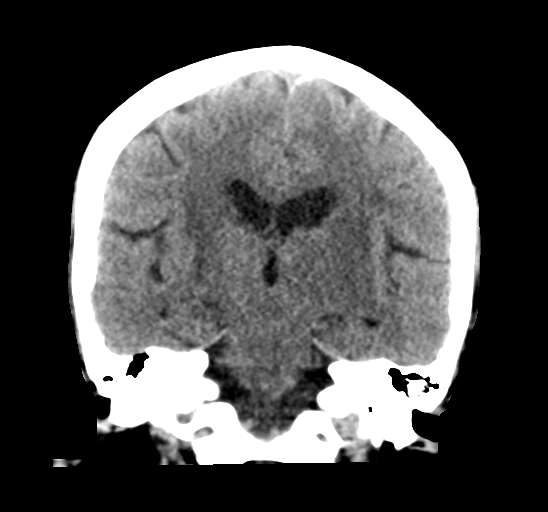
[im 37/67  brain]
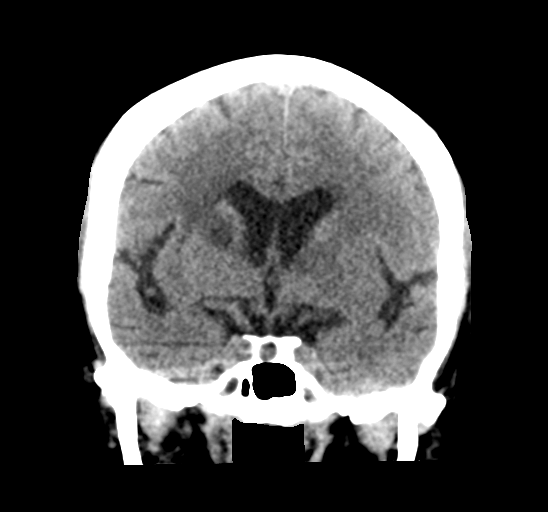

[Series 6: head 3.0 mpr sag · sagittal · 0.32mm/px · 3 of 63 slices shown]
[im 21/63  brain]
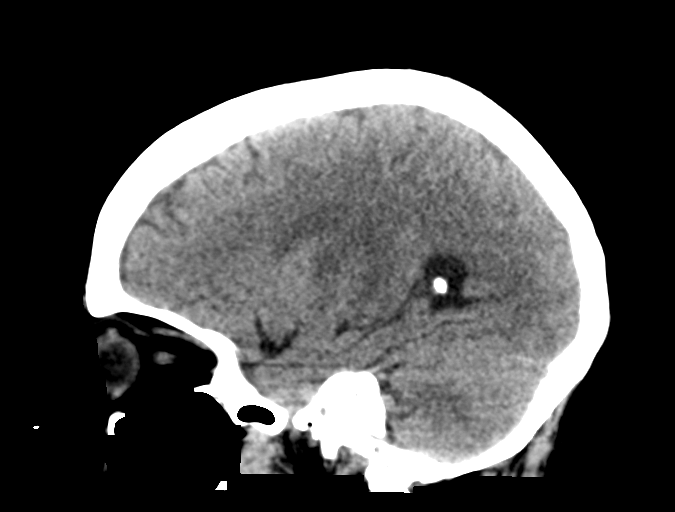
[im 32/63  brain]
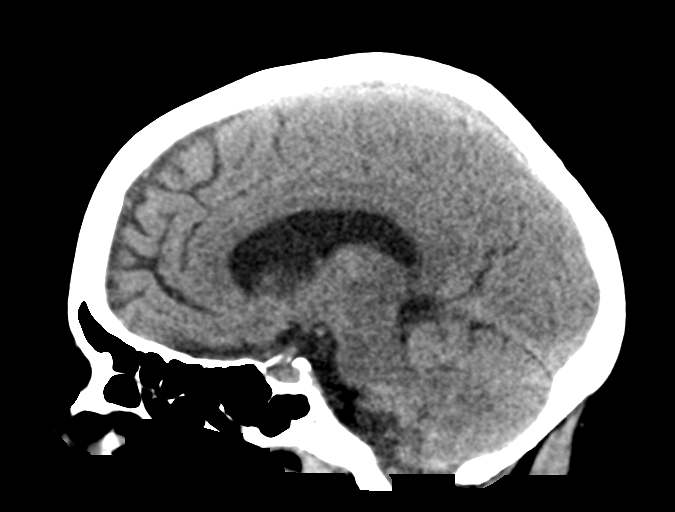
[im 42/63  brain]
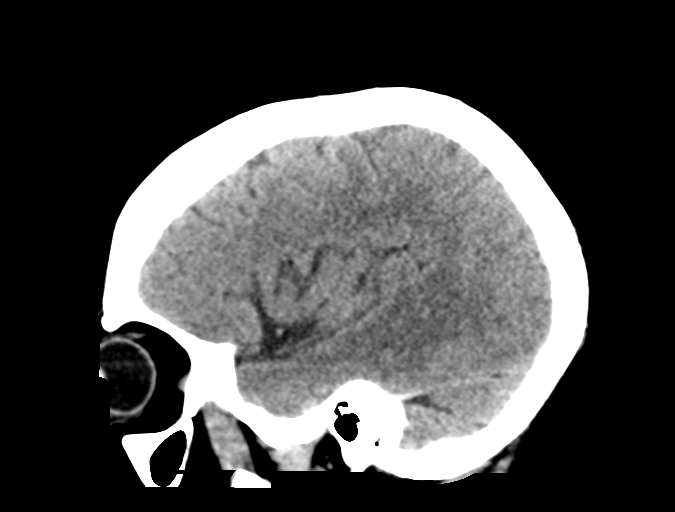

[15 of 47 positions shown; findings below may reference images not displayed]

FINDINGS: Brain: There is new multifocal hypoattenuation in the basal ganglia
and thalami bilaterally. A band of low-density involving the right
caudate and lentiform nuclei has an appearance suggestive of an
acute lateral lenticulostriate territory infarct. No acute
cortically based infarct, intracranial hemorrhage, midline shift, or
extra-axial fluid collection is identified. Hypodensities in the
periventricular white matter bilaterally are nonspecific but
compatible with mild chronic small vessel ischemic disease.

Vascular: No hyperdense vessel.

Skull: No fracture or suspicious osseous lesion.

Sinuses/Orbits: Visualized paranasal sinuses and mastoid air cells
are clear. Unremarkable orbits.

Other: None.
IMPRESSION: New multifocal hypoattenuation in the basal ganglia and thalami
bilaterally of indeterminate etiology. Considerations include
multifocal infarcts (including venous infarcts), toxic/metabolic
insult, hypoxic-ischemic injury, and infection. No mass effect to
suggest tumor. Correlate with pending MRI.

## 2021-05-26 IMAGING — CT CT ANGIO HEAD
1 of 8 series · 14 of 47 positions shown · IV contrast (OMNI)
Comparison: None.

CLINICAL DATA: Stroke suspected.

EXAM:
CT ANGIOGRAPHY HEAD AND NECK
TECHNIQUE: Multidetector CT imaging of the head and neck was performed using
the standard protocol during bolus administration of intravenous
contrast. Multiplanar CT image reconstructions and MIPs were
obtained to evaluate the vascular anatomy. Carotid stenosis
measurements (when applicable) are obtained utilizing NASCET
criteria, using the distal internal carotid diameter as the
denominator.
CONTRAST:  75mL OMNIPAQUE IOHEXOL 350 MG/ML SOLN

[Series 6: thin · axial · 0.49mm/px · z∈[+1042,+1333]mm · 14 of 672 slices shown]
[im 45/672  brain]
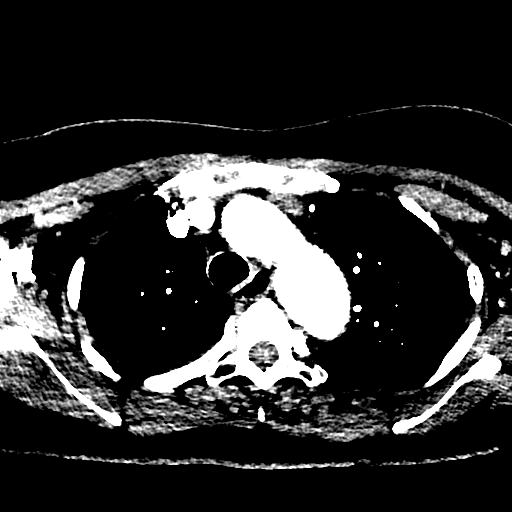
[im 90/672  bone]
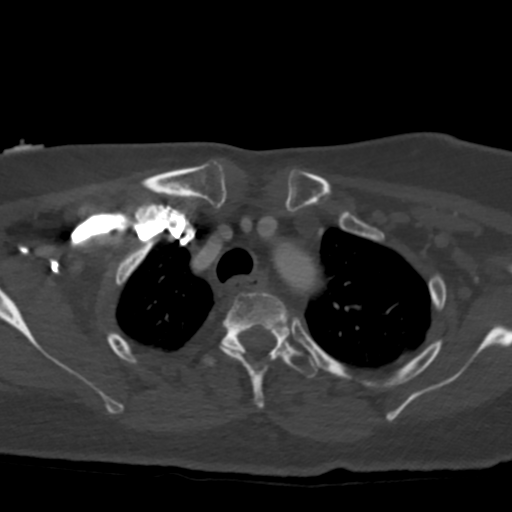
[im 135/672  brain]
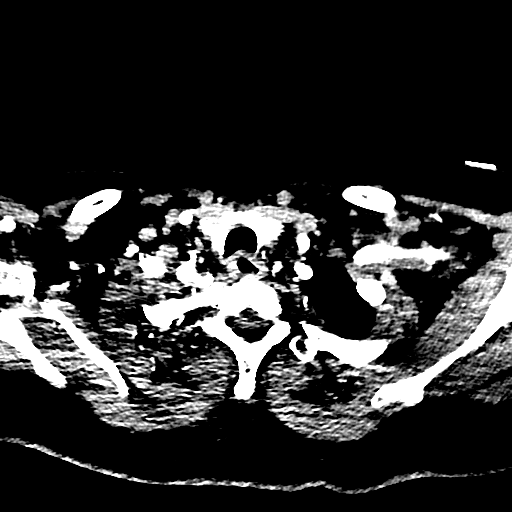
[im 179/672  bone]
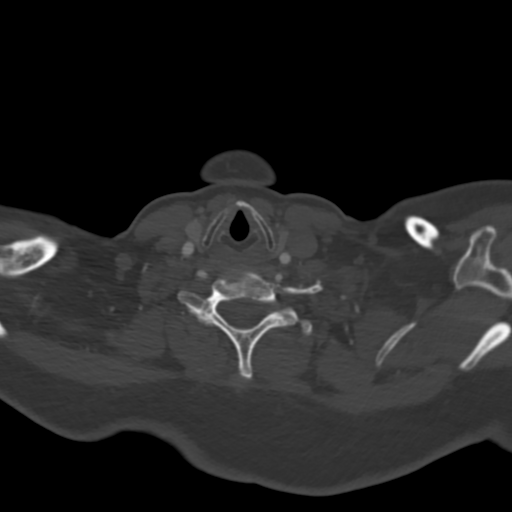
[im 224/672  brain]
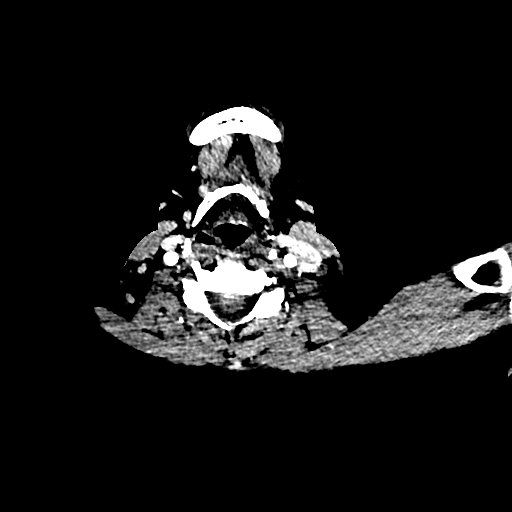
[im 269/672  bone]
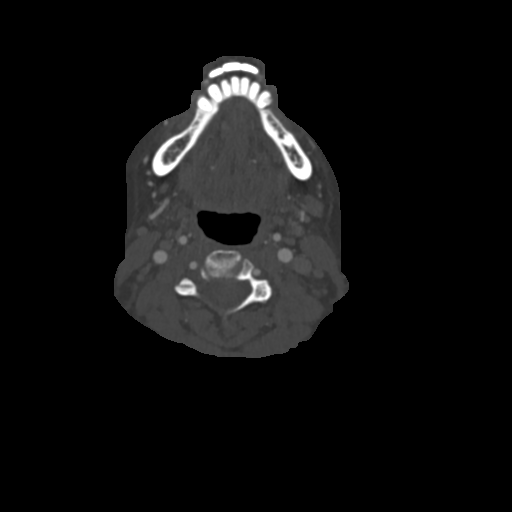
[im 314/672  brain]
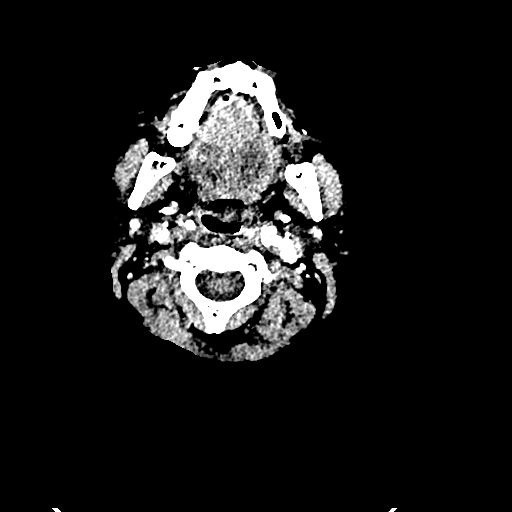
[im 358/672  bone]
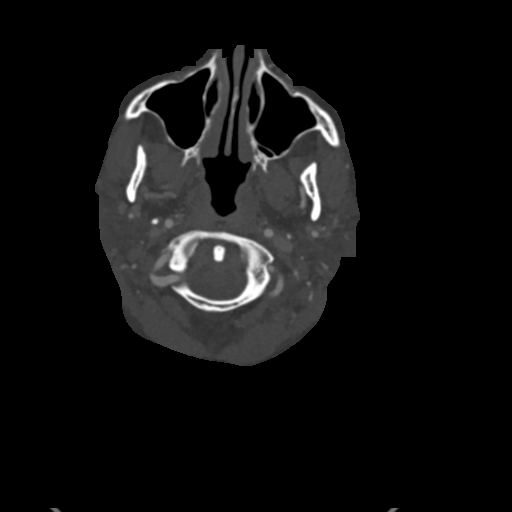
[im 403/672  brain]
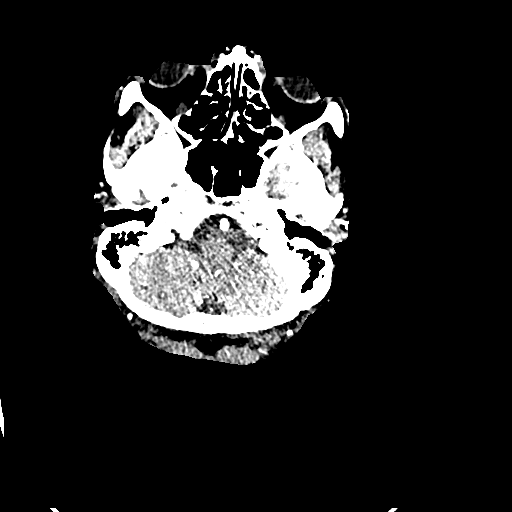
[im 448/672  bone]
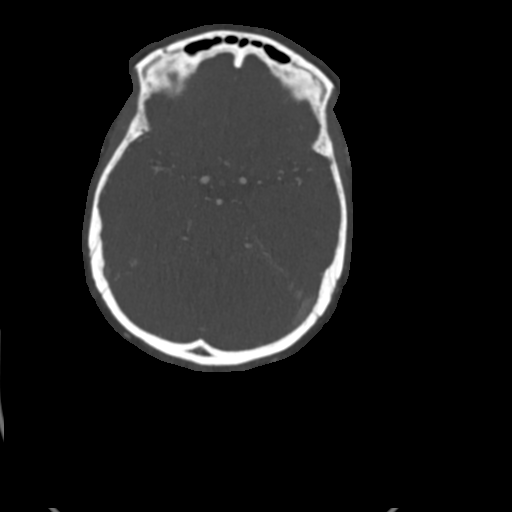
[im 493/672  brain]
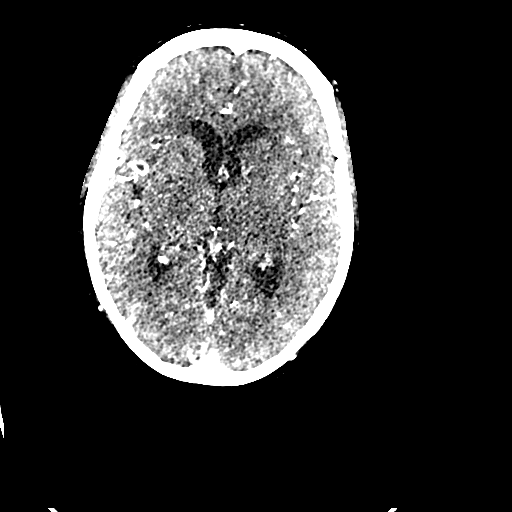
[im 537/672  bone]
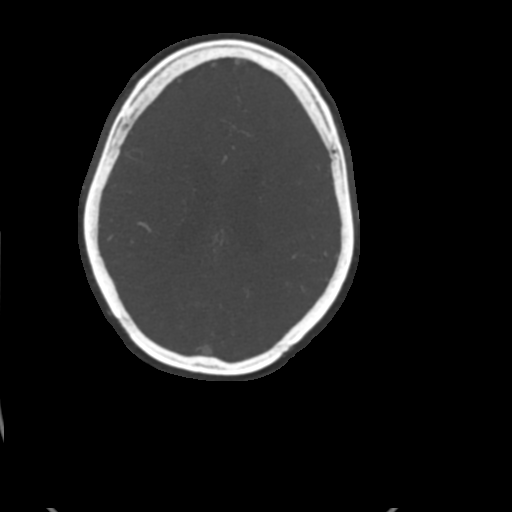
[im 582/672  brain]
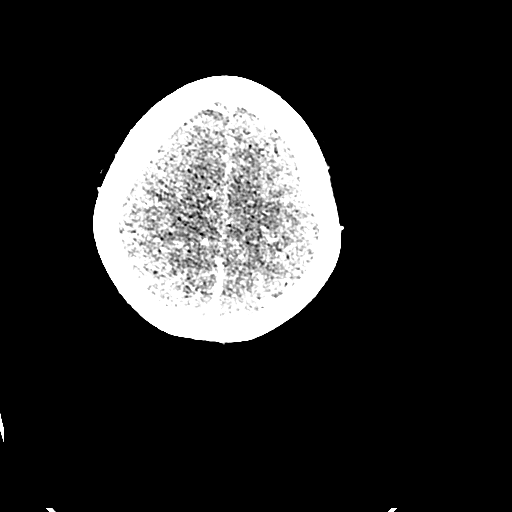
[im 627/672  bone]
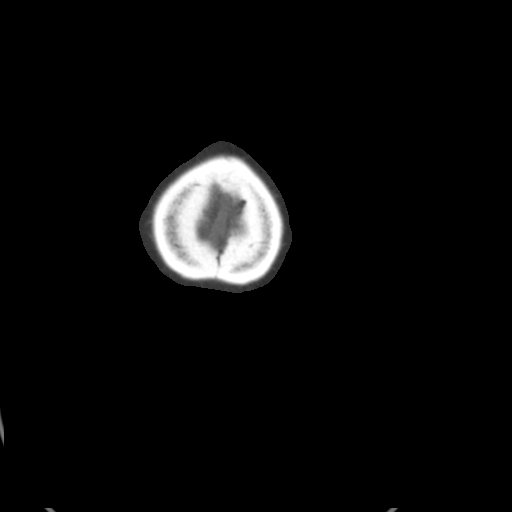

[14 of 47 positions shown; findings below may reference images not displayed]

FINDINGS: CTA NECK FINDINGS

Aortic arch: Standard 3 vessel aortic arch with mild atherosclerotic
plaque. Widely patent arch vessel origins.

Right carotid system: Patent without evidence of stenosis,
dissection, or significant atherosclerosis.

Left carotid system: Patent with minimal calcified plaque at the
carotid bifurcation. No evidence of dissection or stenosis. Tortuous
mid cervical ICA.

Vertebral arteries: Patent with the right being slightly larger than
the left. No evidence of dissection or stenosis.

Skeleton: Advanced disc degeneration at C5-6. Advanced facet
arthrosis at C4-5 with grade 1 anterolisthesis.

Other neck: No evidence of cervical lymphadenopathy or mass.

Upper chest: No apical lung consolidation or mass.

Review of the MIP images confirms the above findings

CTA HEAD FINDINGS

Anterior circulation: The internal carotid arteries are patent from
skull base to carotid termini with minimal nonstenotic plaque. ACAs
and MCAs are patent without evidence of a proximal branch occlusion
or significant proximal stenosis. No aneurysm is identified.

Posterior circulation: The intracranial vertebral arteries are
widely patent to the basilar. Patent bilateral PICA, left AICA, and
bilateral SCA origins are visualized. A right AICA is not clearly
identified. The basilar artery is widely patent. There are small
posterior communicating arteries bilaterally. Both PCAs are patent
without evidence of a significant proximal stenosis. No aneurysm is
identified.

Venous sinuses: As permitted by contrast timing, patent.

Anatomic variants: None.

Review of the MIP images confirms the above findings
IMPRESSION: 1. Minimal atherosclerosis in the head and neck without large vessel
occlusion or significant stenosis.
2. Aortic Atherosclerosis ([WC]-[WC]).

## 2021-05-26 MED ORDER — ATORVASTATIN CALCIUM 80 MG PO TABS
80.0000 mg | ORAL_TABLET | Freq: Every day | ORAL | Status: DC
  Administered 2021-05-26 – 2021-05-27 (×2): 80 mg via ORAL
  Filled 2021-05-26 (×2): qty 1

## 2021-05-26 MED ORDER — SUMATRIPTAN SUCCINATE 100 MG PO TABS
100.0000 mg | ORAL_TABLET | Freq: Once | ORAL | Status: AC
  Administered 2021-05-26: 100 mg via ORAL
  Filled 2021-05-26: qty 1

## 2021-05-26 MED ORDER — HYDROCODONE-ACETAMINOPHEN 7.5-325 MG PO TABS
1.0000 | ORAL_TABLET | Freq: Four times a day (QID) | ORAL | Status: DC | PRN
  Administered 2021-05-27 – 2021-06-07 (×9): 1 via ORAL
  Filled 2021-05-26 (×10): qty 1

## 2021-05-26 MED ORDER — ASPIRIN 300 MG RE SUPP
300.0000 mg | Freq: Every day | RECTAL | Status: DC
  Filled 2021-05-26 (×3): qty 1

## 2021-05-26 MED ORDER — GADOBUTROL 1 MMOL/ML IV SOLN
6.5000 mL | Freq: Once | INTRAVENOUS | Status: AC | PRN
  Administered 2021-05-26: 6.5 mL via INTRAVENOUS

## 2021-05-26 MED ORDER — STROKE: EARLY STAGES OF RECOVERY BOOK
Freq: Once | Status: AC
  Filled 2021-05-26: qty 1

## 2021-05-26 MED ORDER — HYDRALAZINE HCL 25 MG PO TABS
25.0000 mg | ORAL_TABLET | Freq: Three times a day (TID) | ORAL | Status: DC
  Administered 2021-05-26 – 2021-05-28 (×5): 25 mg via ORAL
  Filled 2021-05-26 (×6): qty 1

## 2021-05-26 MED ORDER — ASPIRIN 325 MG PO TABS
325.0000 mg | ORAL_TABLET | Freq: Every day | ORAL | Status: DC
  Administered 2021-05-26 – 2021-05-28 (×3): 325 mg via ORAL
  Filled 2021-05-26 (×3): qty 1

## 2021-05-26 MED ORDER — IOHEXOL 350 MG/ML SOLN
75.0000 mL | Freq: Once | INTRAVENOUS | Status: AC | PRN
  Administered 2021-05-26: 75 mL via INTRAVENOUS

## 2021-05-26 NOTE — Consult Note (Addendum)
Neurology Consultation  Reason for Consult: Weakness, fever Referring Physician: Dr. Niel Hummer  CC: Weakness, fever  History is obtained from: Patient, chart, patient's husband at bedside  HPI: Michaela Rios is a 70 y.o. female past medical history of anxiety, depression, chronic migraines follows with [facility], currently on topiramate/sumatriptan and antidepressants along with opiates, hypertension, lower back pain, peripheral vascular disease, presenting to the emergency room for evaluation of falls and weakness.  Also noted to have fever on arrival 101.4.  Another reading of 69 Fahrenheit which looks spurious because a few minutes after that her temperature was normal. The patient has been reports that about a week or so ago she started feeling a little weak, having difficulty walking maintaining her balance and had multiple falls.  She has had poor p.o. intake for the past few days as well also complaining of increased urinary frequency. Reports some right abdominal flank pain.  Reports no subjective fevers but reports some chills. No memory issues at baseline. Admitted for work-up of sepsis of unknown source.  Urinalysis, chest x-ray and CT abdomen negative for any explainable cause for sepsis. Neurological consultation obtained for opinion on this being a possible CNS infection. Last known well at least a week ago Modified Rankin-0 tPA given-no-outside the window   ROS: Full ROS was performed and is negative except as noted in the HPI.  Past Medical History:  Diagnosis Date   Anemia    Anxiety    Arthritis    Cataract    Chronic migraine    follows with neuro for same   Clotting disorder (HCC)    clot in ovarian vein- hx   Esophagitis    GERD (gastroesophageal reflux disease)    Hypertension    IBS (irritable bowel syndrome)    Low back pain syndrome    MRSA (methicillin resistant Staphylococcus aureus) infection 04/17/12   left torso   Peptic ulcer disease  2003, 12/2012   Peripheral vascular disease ([place])    Vitamin D deficiency      Family History  Problem Relation Age of Onset   Aneurysm Father    Prostate cancer Brother    Lung cancer Brother    Colon cancer Neg Hx    Esophageal cancer Neg Hx    Rectal cancer Neg Hx    Stomach cancer Neg Hx      Social History:   reports that she has never smoked. She has never used smokeless tobacco. She reports current alcohol use. She reports that she does not use drugs.  Medications  Current Facility-Administered Medications:    acetaminophen (TYLENOL) tablet 650 mg, 650 mg, Oral, Q6H PRN, 650 mg at 05/25/21 0438 **OR** acetaminophen (TYLENOL) suppository 650 mg, 650 mg, Rectal, Q6H PRN, Orma Flaming, MD   amLODipine (NORVASC) tablet 5 mg, 5 mg, Oral, q AM, Orma Flaming, MD, 5 mg at 05/26/21 0606   aztreonam (AZACTAM) 2 g in sodium chloride 0.9 % 100 mL IVPB, 2 g, Intravenous, Q8H, Heloise Purpura, RPH, Last Rate: 200 mL/hr at 05/26/21 1305, 2 g at 05/26/21 1305   buPROPion (WELLBUTRIN XL) 24 hr tablet 300 mg, 300 mg, Oral, Daily, Orma Flaming, MD, 300 mg at 05/26/21 0839   enoxaparin (LOVENOX) injection 40 mg, 40 mg, Subcutaneous, Q24H, Orma Flaming, MD, 40 mg at 05/25/21 2247   feeding supplement (ENSURE ENLIVE / ENSURE PLUS) liquid 237 mL, 237 mL, Oral, BID BM, Orma Flaming, MD   hydrALAZINE (APRESOLINE) tablet 25 mg, 25 mg, Oral,  Q8H, Regalado, Belkys A, MD   HYDROcodone-acetaminophen (NORCO) 7.5-325 MG per tablet 1 tablet, 1 tablet, Oral, Q6H PRN, Regalado, Belkys A, MD   lactated ringers infusion, , Intravenous, Continuous, Orma Flaming, MD, Last Rate: 100 mL/hr at 05/26/21 0608, New Bag at 05/26/21 0608   LORazepam (ATIVAN) tablet 1 mg, 1 mg, Oral, BID PRN, Orma Flaming, MD, 1 mg at 05/26/21 9323   metoprolol succinate (TOPROL-XL) 24 hr tablet 100 mg, 100 mg, Oral, Daily, Orma Flaming, MD, 100 mg at 05/26/21 5573   metroNIDAZOLE (FLAGYL) IVPB 500 mg, 500 mg,  Intravenous, Q8H, Orma Flaming, MD, Last Rate: 100 mL/hr at 05/26/21 1122, 500 mg at 05/26/21 1122   multivitamin with minerals tablet 1 tablet, 1 tablet, Oral, Daily, Regalado, Belkys A, MD, 1 tablet at 05/26/21 0839   ondansetron (ZOFRAN) tablet 4 mg, 4 mg, Oral, Q6H PRN, Orma Flaming, MD   tiZANidine (ZANAFLEX) tablet 4 mg, 4 mg, Oral, BID PRN, Orma Flaming, MD   topiramate (TOPAMAX) tablet 150 mg, 150 mg, Oral, QHS, Orma Flaming, MD, 150 mg at 05/25/21 2246   vancomycin (VANCOREADY) IVPB 1000 mg/200 mL, 1,000 mg, Intravenous, Q24H, Heloise Purpura, RPH, Stopped at 05/25/21 2341   venlafaxine XR (EFFEXOR-XR) 24 hr capsule 75 mg, 75 mg, Oral, Q breakfast, Orma Flaming, MD, 75 mg at 05/26/21 0839   zolpidem (AMBIEN) tablet 5 mg, 5 mg, Oral, QHS PRN, Orma Flaming, MD, 5 mg at 05/25/21 2247   Exam: Current vital signs: BP (!) 162/116 (BP Location: Left Arm)   Pulse 96   Temp 98.6 F (37 C) (Oral)   Resp 18   Ht 5\' 3"  (1.6 m)   Wt 60.8 kg   SpO2 96%   BMI 23.74 kg/m  Vital signs in last 24 hours: Temp:  [98.2 F (36.8 C)-98.6 F (37 C)] 98.6 F (37 C) (06/29 0309) Pulse Rate:  [81-96] 96 (06/29 0840) Resp:  [18-19] 18 (06/29 0309) BP: (154-175)/(91-116) 162/116 (06/29 0840) SpO2:  [96 %-99 %] 96 % (06/29 0840)  General: Awake alert in no distress HEENT: [place] atraumatic Lungs: Clear Cardiovascular: Regular rhythm Abdomen soft nondistended with some palpable tenderness in the right flank Extremities warm well perfused Neurological exam Awake alert oriented x3 No dysarthria No aphasia Cranials 2-12 intact Motor exam with antigravity 5/5 in all 4 extremities. Sensation intact light touch Coordination: No evidence of dysmetria on finger-nose-finger or heel-knee-shin testing. NIH stroke scale 0  Labs I have reviewed labs in epic and the results pertinent to this consultation are:   CBC    Component Value Date/Time   WBC 6.1 05/26/2021 0236   RBC 3.77  (L) 05/26/2021 0236   HGB 11.1 (L) 05/26/2021 0236   HCT 34.3 (L) 05/26/2021 0236   PLT 221 05/26/2021 0236   MCV 91.0 05/26/2021 0236   MCH 29.4 05/26/2021 0236   MCHC 32.4 05/26/2021 0236   RDW 15.1 05/26/2021 0236   LYMPHSABS 0.9 04/07/2021 1450   MONOABS 0.7 04/07/2021 1450   EOSABS 0.2 04/07/2021 1450   BASOSABS 0.1 04/07/2021 1450    CMP     Component Value Date/Time   NA 132 (L) 05/26/2021 0236   K 3.6 05/26/2021 0236   CL 103 05/26/2021 0236   CO2 18 (L) 05/26/2021 0236   GLUCOSE 94 05/26/2021 0236   BUN 8 05/26/2021 0236   CREATININE 0.70 05/26/2021 0236   CALCIUM 8.9 05/26/2021 0236   PROT 6.4 (L) 05/26/2021 0236   ALBUMIN 2.9 (L) 05/26/2021 0236  AST 25 05/26/2021 0236   ALT 13 05/26/2021 0236   ALKPHOS 54 05/26/2021 0236   BILITOT 0.6 05/26/2021 0236   GFRNONAA >60 05/26/2021 0236   GFRAA >60 08/19/2020 0745    Lipid Panel     Component Value Date/Time   CHOL 150 04/06/2020 1423   TRIG 173.0 (H) 04/06/2020 1423   HDL 44.40 04/06/2020 1423   CHOLHDL 3 04/06/2020 1423   VLDL 34.6 04/06/2020 1423   LDLCALC 71 04/06/2020 1423     Imaging I have reviewed the images obtained: No imaging available-have ordered an MRI-will addend.  Assessment: 70 year old woman past medical history of anxiety depression chronic migraine, on opiates, history of hypertension lower back pain peripheral vascular disease presenting for evaluation of falls and generalized weakness. Noted to have 2 readings of fever but one of them definitely spuriously other 1 might be real. Concern for CNS infection because no other source for fever has been found. On my examination, she does not have findings pointing to a CNS infectious etiology. I suspect that there might be some role of her worsening anxiety/depression as well as polypharmacy in her presentation of generalized weakness. Brain imaging would be imperative to rule out a stroke given multiple falls and stumbling gait  reported.  Impression: Generalized weakness-evaluate for stroke versus other structural abnormalities Evaluate for nutritional deficiency and hypothyroidism given generalized weakness  Recommendations: Stat MRI of the brain B12, thiamine, TSH Further recommendations to follow  Addendum MRI brain completed and personally reviewed-interesting findings Right basal ganglia stroke in addition to accelerated small vessel disease.  Etiology of the stroke also is likely small vessel disease In addition, edema of deep GM bilaterally. Unclear significance - differentials broad and include lymphoma or infection such as toxo. CT brain also with multifocal hypoattenuations - rt BG c/w stroke - others of unclear significance as above. Needs full stroke work-up plus more  as below - Telemetry - A1c, LDL - CT angio head and neck - 2D echo - PT OT speech therapy - No need for permissive hypertension - Aspirin 325 - Atorvastatin 80 -MR brain with contrast -CTA head/neck -check HIV status -Consider LP after contrast imaging. Stroke team will follow Plan relayed to the primary hospitalist-Dr. Regalado  -- Amie Portland, MD Neurologist [facility] Pager: [phone]

## 2021-05-26 NOTE — Progress Notes (Addendum)
PROGRESS NOTE    Michaela Rios  YBW:[id number] DOB: 19-Mar-1951 DOA: 05/24/2021 PCP: Binnie Rail, MD   Brief Narrative: 70 year old with past medical history significant for hypertension, prediabetes, depression, CKD stage II, history of PUD, chronic low back pain, migraine who presented to the ED with generalized weakness and loss of balance.  Patient fell on Sunday while trying to go to the bathroom and lost her balance.  She has been having shivering.  She denies cough, dysuria, abdominal pain.  She developed diarrhea overnight.  Patient was found to have high fever 101.4 on admission.  Elevated D-dimer.  CTA negative for PE.  Dopplers lower extremity negative for DVT. Admitted with SIRS criteria unclear etiology   Assessment & Plan:   Principal Problem:   Sepsis ([place]) Active Problems:   Anxiety   Depression   Essential hypertension   Prediabetes   Hypokalemia   Tachycardia  1-SIRS;  Presents with fever, tachycardia, tachypnea,  mild elevation of lactic acid. Confusion.  Chest x ray negative for PNA.  UA; negative for UTI.  COVID PCR negative Urine culture no growth to date. and blood cultures: no growth to date.  If diarrhea persists may need C diff  if sample. No further diarrhea since yesterday.  CT abdomen and pelvis: Negative for acute infectious process, hepatic asteatosis No Nuchal rigidity. Complaints of chronic migraines.  Continue with IV antibiotics Vancomycin, aztreonam, flagyl.   Check ANA, Double strand DNA Will ask neurology evaluation, patient with confusion.   Acute metabolic encephalopathy;  Husband report patient had confusion episode,  she thought this morning she was at home.  She feels weak, generalized weakness, sluggish.  Plan to get CT head/MRI.  Neurology Consulted. Does she need LP ? .  CT head: New multifocal hypoattenuation in the basilar ganglia and thalami bilaterally of indeterminate etiology.  Consideration include multifocal  infarcts, toxic metabolic insults, hypoxic ischemic injury and infection.  No mass-effect to suggest tumor. Correlate  with MRI.   Hypokalemia; replaced.   HTN;  Continue with Norvasc, hydralazine and metoprolol.//    Generalized weakness.  IV fluids.  PT , OT  Check B 12.   Depression;  Continue with Wellbutrin, Effexor.   CKD stage II Stable.   Migraines; continue with topiromate.  Neuro exam non focal.   Pulmonary Nodule. Needs to follow up with PCP.  Positive D dimer; Doppler LE negative for DVT> CTA negative for PE>   Nutrition Problem: Inadequate oral intake Etiology: decreased appetite    Signs/Symptoms: per patient/family report, meal completion < 25%    Interventions: Ensure Enlive (each supplement provides 350kcal and 20 grams of protein), Magic cup, MVI  Estimated body mass index is 23.74 kg/m as calculated from the following:   Height as of this encounter: 5\' 3"  (1.6 m).   Weight as of this encounter: 60.8 kg.   DVT prophylaxis: Lovenox Code Status: Full code Family Communication: Husband who was at bedside Disposition Plan:  Status is: Inpatient  Remains inpatient appropriate because:IV treatments appropriate due to intensity of illness or inability to take PO  Dispo: The patient is from: Home              Anticipated d/c is to:  to be determine              Patient currently is not medically stable to d/c.   Difficult to place patient No        Consultants:  None  Procedures:  none  Antimicrobials:  Vancomycin Aztreonam.   Subjective:  She doesn't feel well, feels sluggish , weak. Per husband she is so weak she is not able to sit up. She had episode this am she was confuse, she thought she was at home.  She doesn't see any improvement.  Headaches better.   Objective: Vitals:   05/25/21 1900 05/26/21 0309 05/26/21 0840 05/26/21 1401  BP: (!) 175/106 (!) 167/92 (!) 162/116 (!) 140/97  Pulse: 84 86 96 94  Resp: 19 18     Temp: 98.3 F (36.8 C) 98.6 F (37 C)    TempSrc: Oral Oral    SpO2: 98% 97% 96%   Weight:      Height:        Intake/Output Summary (Last 24 hours) at 05/26/2021 1527 Last data filed at 05/26/2021 0840 Gross per 24 hour  Intake 2300.62 ml  Output 350 ml  Net 1950.62 ml    Filed Weights   05/24/21 0956  Weight: 60.8 kg    Examination:  General exam: NAD Respiratory system: CTA Cardiovascular system: S 1, S 2 RRR Gastrointestinal system: BS present, soft, nt Central nervous system: alert, oriented to place, time, slow in answering question. Motor strength 5/5  Extremities: Symmetric 5 x 5 power.    Data Reviewed: I have personally reviewed following labs and imaging studies  CBC: Recent Labs  Lab 05/24/21 1005 05/25/21 0155 05/26/21 0236  WBC 8.6 8.1 6.1  HGB 11.6* 10.5* 11.1*  HCT 35.8* 32.9* 34.3*  MCV 92.0 93.2 91.0  PLT 243 199 709    Basic Metabolic Panel: Recent Labs  Lab 05/24/21 1005 05/24/21 1940 05/25/21 0155 05/26/21 0236  NA 139  --  137 132*  K 2.8*  --  3.9 3.6  CL 105  --  106 103  CO2 22  --  24 18*  GLUCOSE 121*  --  101* 94  BUN 9  --  5* 8  CREATININE 0.77  --  0.71 0.70  CALCIUM 9.3  --  8.7* 8.9  MG  --  2.3  --   --     GFR: Estimated Creatinine Clearance: 54.1 mL/min (by C-G formula based on SCr of 0.7 mg/dL). Liver Function Tests: Recent Labs  Lab 05/24/21 1005 05/26/21 0236  AST 24 25  ALT 16 13  ALKPHOS 75 54  BILITOT 0.5 0.6  PROT 7.7 6.4*  ALBUMIN 3.4* 2.9*    No results for input(s): LIPASE, AMYLASE in the last 168 hours. No results for input(s): AMMONIA in the last 168 hours. Coagulation Profile: Recent Labs  Lab 05/24/21 1150  INR 1.1    Cardiac Enzymes: No results for input(s): CKTOTAL, CKMB, CKMBINDEX, TROPONINI in the last 168 hours. BNP (last 3 results) No results for input(s): PROBNP in the last 8760 hours. HbA1C: No results for input(s): HGBA1C in the last 72 hours. CBG: No results for  input(s): GLUCAP in the last 168 hours. Lipid Profile: No results for input(s): CHOL, HDL, LDLCALC, TRIG, CHOLHDL, LDLDIRECT in the last 72 hours. Thyroid Function Tests: Recent Labs    05/26/21 1225  TSH 1.413   Anemia Panel: No results for input(s): VITAMINB12, FOLATE, FERRITIN, TIBC, IRON, RETICCTPCT in the last 72 hours. Sepsis Labs: Recent Labs  Lab 05/24/21 1051 05/24/21 1227 05/24/21 1940  PROCALCITON  --   --  <0.10  LATICACIDVEN 2.1* 1.5  --      Recent Results (from the past 240 hour(s))  Blood culture (routine x 2)  Status: None (Preliminary result)   Collection Time: 05/24/21 10:51 AM   Specimen: BLOOD  Result Value Ref Range Status   Specimen Description BLOOD RIGHT ANTECUBITAL  Final   Special Requests   Final    BOTTLES DRAWN AEROBIC AND ANAEROBIC Blood Culture adequate volume   Culture   Final    NO GROWTH 2 DAYS Performed at [facility], [address] [place], [place] [address]    Report Status PENDING  Incomplete  Resp Panel by RT-PCR (Flu A&B, Covid) Nasopharyngeal Swab     Status: None   Collection Time: 05/24/21 11:14 AM   Specimen: Nasopharyngeal Swab; Nasopharyngeal(NP) swabs in vial transport medium  Result Value Ref Range Status   SARS Coronavirus 2 by RT PCR NEGATIVE NEGATIVE Final    Comment: (NOTE) SARS-CoV-2 target nucleic acids are NOT DETECTED.  The SARS-CoV-2 RNA is generally detectable in upper respiratory specimens during the acute phase of infection. The lowest concentration of SARS-CoV-2 viral copies this assay can detect is 138 copies/mL. A negative result does not preclude SARS-Cov-2 infection and should not be used as the sole basis for treatment or other patient management decisions. A negative result may occur with  improper specimen collection/handling, submission of specimen other than nasopharyngeal swab, presence of viral mutation(s) within the areas targeted by this assay, and inadequate number of  viral copies(<138 copies/mL). A negative result must be combined with clinical observations, patient history, and epidemiological information. The expected result is Negative.  Fact Sheet for Patients:  EntrepreneurPulse.com.au  Fact Sheet for Healthcare Providers:  IncredibleEmployment.be  This test is no t yet approved or cleared by the [place] FDA and  has been authorized for detection and/or diagnosis of SARS-CoV-2 by FDA under an Emergency Use Authorization (EUA). This EUA will remain  in effect (meaning this test can be used) for the duration of the COVID-19 declaration under Section 564(b)(1) of the Act, 21 U.S.C.section 360bbb-3(b)(1), unless the authorization is terminated  or revoked sooner.       Influenza A by PCR NEGATIVE NEGATIVE Final   Influenza B by PCR NEGATIVE NEGATIVE Final    Comment: (NOTE) The Xpert Xpress SARS-CoV-2/FLU/RSV plus assay is intended as an aid in the diagnosis of influenza from Nasopharyngeal swab specimens and should not be used as a sole basis for treatment. Nasal washings and aspirates are unacceptable for Xpert Xpress SARS-CoV-2/FLU/RSV testing.  Fact Sheet for Patients: EntrepreneurPulse.com.au  Fact Sheet for Healthcare Providers: IncredibleEmployment.be  This test is not yet approved or cleared by the [place] FDA and has been authorized for detection and/or diagnosis of SARS-CoV-2 by FDA under an Emergency Use Authorization (EUA). This EUA will remain in effect (meaning this test can be used) for the duration of the COVID-19 declaration under Section 564(b)(1) of the Act, 21 U.S.C. section 360bbb-3(b)(1), unless the authorization is terminated or revoked.  Performed at [facility], [place] [address] [place], [place] [address]   Blood culture (routine x 2)     Status: None (Preliminary result)   Collection Time: 05/24/21 12:03 PM    Specimen: BLOOD  Result Value Ref Range Status   Specimen Description BLOOD LEFT ANTECUBITAL  Final   Special Requests   Final    BOTTLES DRAWN AEROBIC AND ANAEROBIC Blood Culture adequate volume   Culture   Final    NO GROWTH 2 DAYS Performed at [facility], [place] [address], [place], [place] [address]    Report Status PENDING  Incomplete  Urine culture     Status: None   Collection Time: 05/24/21 12:48 PM   Specimen: Urine, Clean Catch  Result Value Ref Range Status   Specimen Description URINE, CLEAN CATCH  Final   Special Requests NONE  Final   Culture   Final    NO GROWTH Performed at [facility], [address], [place], [place] [address]    Report Status 05/25/2021 FINAL  Final          Radiology Studies: CT HEAD WO CONTRAST  Result Date: 05/26/2021 CLINICAL DATA:  Headache. EXAM: CT HEAD WITHOUT CONTRAST TECHNIQUE: Contiguous axial images were obtained from the base of the skull through the vertex without intravenous contrast. COMPARISON:  08/17/2020 FINDINGS: Brain: There is new multifocal hypoattenuation in the basal ganglia and thalami bilaterally. A band of low-density involving the right caudate and lentiform nuclei has an appearance suggestive of an acute lateral lenticulostriate territory infarct. No acute cortically based infarct, intracranial hemorrhage, midline shift, or extra-axial fluid collection is identified. Hypodensities in the periventricular white matter bilaterally are nonspecific but compatible with mild chronic small vessel ischemic disease. Vascular: No hyperdense vessel. Skull: No fracture or suspicious osseous lesion. Sinuses/Orbits: Visualized paranasal sinuses and mastoid air cells are clear. Unremarkable orbits. Other: None. IMPRESSION: New multifocal hypoattenuation in the basal ganglia and thalami bilaterally of indeterminate etiology. Considerations include multifocal infarcts (including venous infarcts), toxic/metabolic insult,  hypoxic-ischemic injury, and infection. No mass effect to suggest tumor. Correlate with pending MRI. Electronically Signed   By: Logan Bores M.D.   On: 05/26/2021 15:20   CT Angio Chest PE W/Cm &/Or Wo Cm  Result Date: 05/24/2021 CLINICAL DATA:  Elevated D-dimer, tachycardia EXAM: CT ANGIOGRAPHY CHEST WITH CONTRAST TECHNIQUE: Multidetector CT imaging of the chest was performed using the standard protocol during bolus administration of intravenous contrast. Multiplanar CT image reconstructions and MIPs were obtained to evaluate the vascular anatomy. CONTRAST:  76mL OMNIPAQUE IOHEXOL 350 MG/ML SOLN COMPARISON:  None. FINDINGS: Cardiovascular: Normal heart size. No pericardial effusion. Thoracic aorta is normal in caliber minor calcified plaque. Satisfactory opacification of the pulmonary arteries to the proximal segmental level. No evidence of acute pulmonary embolism. Mediastinum/Nodes: No enlarged mediastinal or hilar lymph nodes. Mildly prominent but nonenlarged axillary and subpectoral lymph nodes. Lungs/Pleura: No pleural effusion or pneumothorax. There is a 4 mm peripheral nodular opacity of the right middle lobe (series 7, image 58). Upper Abdomen: No acute abnormality. Postoperative changes at the gastroesophageal junction. Musculoskeletal: Degenerative changes of the spine. No acute osseous abnormality. Review of the MIP images confirms the above findings. IMPRESSION: No evidence of acute pulmonary embolism or other acute abnormality. 4 mm right middle lobe peripheral nodular opacity. No follow-up needed if patient is low-risk. Non-contrast chest CT can be considered in 12 months if patient is high-risk. This recommendation follows the consensus statement: Guidelines for Management of Incidental Pulmonary Nodules Detected on CT Images: From the Fleischner Society 2017; Radiology 2017; 284:228-243. Electronically Signed   By: Macy Mis M.D.   On: 05/24/2021 16:41   CT ABDOMEN PELVIS W  CONTRAST  Result Date: 05/25/2021 CLINICAL DATA:  Abdominal pain, fever EXAM: CT ABDOMEN AND PELVIS WITH CONTRAST TECHNIQUE: Multidetector CT imaging of the abdomen and pelvis was performed using the standard protocol following bolus administration of intravenous contrast. CONTRAST:  172mL OMNIPAQUE IOHEXOL 300 MG/ML  SOLN COMPARISON:  05/24/2021 FINDINGS: Lower chest: No acute abnormality. Hepatobiliary: Diffuse low-density throughout the liver compatible with fatty infiltration. No focal  abnormality. Gallbladder unremarkable. Pancreas: No focal abnormality or ductal dilatation. Spleen: No focal abnormality.  Normal size. Adrenals/Urinary Tract: No adrenal abnormality. No focal renal abnormality. No stones or hydronephrosis. Urinary bladder is unremarkable. Stomach/Bowel: Normal appendix. Stomach, large and small bowel grossly unremarkable. Vascular/Lymphatic: No evidence of aneurysm or adenopathy. Reproductive: Prior hysterectomy.  No adnexal masses. Other: No free fluid or free air. Musculoskeletal: No acute bony abnormality. IMPRESSION: Hepatic steatosis. No acute findings in the abdomen or pelvis. Electronically Signed   By: Rolm Baptise M.D.   On: 05/25/2021 19:07   VAS [place] LOWER EXTREMITY VENOUS (DVT)  Result Date: 05/25/2021  Lower Venous DVT Study Patient Name:  Michaela Rios [facility]  Date of Exam:   05/25/2021 Medical Rec #: [id number]          Accession #:    [id number] Date of Birth: 1951-06-26          Patient Gender: F Patient Age:   69Y Exam Location:  [facility] Procedure:      VAS [place] LOWER EXTREMITY VENOUS (DVT) Referring Phys: [id number] Nyimah Shadduck A Mayelin Panos --------------------------------------------------------------------------------  Indications: Edema.  Comparison Study: no prior Performing Technologist: Archie Patten RVS  Examination Guidelines: A complete evaluation includes B-mode imaging, spectral Doppler, color Doppler, and power Doppler as needed of all accessible portions of each  vessel. Bilateral testing is considered an integral part of a complete examination. Limited examinations for reoccurring indications may be performed as noted. The reflux portion of the exam is performed with the patient in reverse Trendelenburg.  +---------+---------------+---------+-----------+----------+--------------+ RIGHT    CompressibilityPhasicitySpontaneityPropertiesThrombus Aging +---------+---------------+---------+-----------+----------+--------------+ CFV      Full           Yes      Yes                                 +---------+---------------+---------+-----------+----------+--------------+ SFJ      Full                                                        +---------+---------------+---------+-----------+----------+--------------+ FV Prox  Full                                                        +---------+---------------+---------+-----------+----------+--------------+ FV Mid   Full                                                        +---------+---------------+---------+-----------+----------+--------------+ FV DistalFull                                                        +---------+---------------+---------+-----------+----------+--------------+ PFV      Full                                                        +---------+---------------+---------+-----------+----------+--------------+  POP      Full           Yes      Yes                                 +---------+---------------+---------+-----------+----------+--------------+ PTV      Full                                                        +---------+---------------+---------+-----------+----------+--------------+ PERO     Full                                                        +---------+---------------+---------+-----------+----------+--------------+   +---------+---------------+---------+-----------+----------+--------------+ LEFT      CompressibilityPhasicitySpontaneityPropertiesThrombus Aging +---------+---------------+---------+-----------+----------+--------------+ CFV      Full           Yes      Yes                                 +---------+---------------+---------+-----------+----------+--------------+ SFJ      Full                                                        +---------+---------------+---------+-----------+----------+--------------+ FV Prox  Full                                                        +---------+---------------+---------+-----------+----------+--------------+ FV Mid   Full                                                        +---------+---------------+---------+-----------+----------+--------------+ FV DistalFull                                                        +---------+---------------+---------+-----------+----------+--------------+ PFV      Full                                                        +---------+---------------+---------+-----------+----------+--------------+ POP      Full           Yes      Yes                                 +---------+---------------+---------+-----------+----------+--------------+  PTV      Full                                                        +---------+---------------+---------+-----------+----------+--------------+ PERO     Full                                                        +---------+---------------+---------+-----------+----------+--------------+     Summary: BILATERAL: - No evidence of deep vein thrombosis seen in the lower extremities, bilaterally. -No evidence of popliteal cyst, bilaterally.   *See table(s) above for measurements and observations. Electronically signed by Deitra Mayo MD on 05/25/2021 at 2:52:23 PM.    Final         Scheduled Meds:  amLODipine  5 mg Oral q AM   buPROPion  300 mg Oral Daily   enoxaparin (LOVENOX) injection  40 mg Subcutaneous  Q24H   feeding supplement  237 mL Oral BID BM   hydrALAZINE  25 mg Oral Q8H   metoprolol succinate  100 mg Oral Daily   multivitamin with minerals  1 tablet Oral Daily   topiramate  150 mg Oral QHS   venlafaxine XR  75 mg Oral Q breakfast   Continuous Infusions:  aztreonam 2 g (05/26/21 1305)   lactated ringers 100 mL/hr at 05/26/21 0608   metronidazole 500 mg (05/26/21 1122)   vancomycin Stopped (05/25/21 2341)     LOS: 2 days    Time spent: 35 minutes.     Elmarie Shiley, MD [facility]   If 7PM-7AM, please contact night-coverage [email or web address]  05/26/2021, 3:27 PM

## 2021-05-26 NOTE — Evaluation (Signed)
Occupational Therapy Evaluation Patient Details Name: Michaela Rios MRN: [id number] DOB: November 19, 1951 Today's Date: 05/26/2021    History of Present Illness Pt is a 70 y.o. female admitted 05/24/21 with c/o weakness, fall due to loss of balance, poor PO intake. Workup for sepsis, unclear etiology. PMH includes HTN, CKD 3, migraines, chronic LBP, prediabetes, anxiety, depression.   Clinical Impression   PT admitted for concerns listed above. PTA pt and husband reported that pt is independent with all ADL's and IADL's, using no DME. At the time of the evaluation, pt presented with a flat affect and reporting that she was experiencing a bad headache. OT provided min assistance to pt for bed mobility and sit<>stands due to weakness and balance concerns. Pt's ADL's are at an overall level of set up to min/mod A. Acute OT will continue to follow up and address concerns listed below.     Follow Up Recommendations  Home health OT (Pending pt progress)    Equipment Recommendations  Other (comment) (RW)    Recommendations for Other Services       Precautions / Restrictions Precautions Precautions: Fall;Other (comment) Precaution Comments: urinary urgency/incontinence (has Depends in room) Restrictions Weight Bearing Restrictions: No      Mobility Bed Mobility Overal bed mobility: Needs Assistance Bed Mobility: Supine to Sit;Sit to Supine     Supine to sit: Min assist Sit to supine: Min assist   General bed mobility comments: Min A to elevate trunk to sitting, Min A to bring legs into bed to return to supine.    Transfers Overall transfer level: Needs assistance Equipment used: None Transfers: Sit to/from Stand Sit to Stand: Min assist         General transfer comment: Min A to power up to standing    Balance Overall balance assessment: Needs assistance Sitting-balance support: Bilateral upper extremity supported;Feet supported Sitting balance-Leahy Scale: Fair Sitting  balance - Comments: Pt leaning backwards, however not losing balance. Postural control: Posterior lean Standing balance support: Single extremity supported Standing balance-Leahy Scale: Poor Standing balance comment: Upon standing pt leans backwards and her toes are up off the ground.                           ADL either performed or assessed with clinical judgement   ADL Overall ADL's : Needs assistance/impaired Eating/Feeding: Set up;Sitting   Grooming: Set up;Sitting   Upper Body Bathing: Min guard;Sitting   Lower Body Bathing: Minimal assistance;Moderate assistance;Sitting/lateral leans;Sit to/from stand   Upper Body Dressing : Set up;Sitting   Lower Body Dressing: Minimal assistance;Sitting/lateral leans;Sit to/from stand   Toilet Transfer: Minimal assistance;Moderate assistance;Stand-pivot   Toileting- Clothing Manipulation and Hygiene: Minimal assistance;Moderate assistance;Sitting/lateral lean;Sit to/from stand   Tub/ Shower Transfer: Minimal assistance;Stand-pivot   Functional mobility during ADLs: Minimal assistance;Moderate assistance General ADL Comments: Pt very lethargic this session, when attempting to stand to assess ADL performance, pt was off balance, leaning backwards, requiring min A support to remain up right, in sitting pt leans backwards and moves slowly, at times requiring additional assistance to complete ADL's.     Vision Baseline Vision/History: Wears glasses Wears Glasses: At all times Patient Visual Report: No change from baseline Vision Assessment?: No apparent visual deficits Additional Comments: Pt keeping her eyes closed for most of session due to headache.     Perception Perception Perception Tested?: No   Praxis Praxis Praxis tested?: Not tested    Pertinent Vitals/Pain Pain Assessment: 0-10  Pain Score: 8  Pain Location: Head Pain Descriptors / Indicators: Headache;Discomfort;Guarding Pain Intervention(s): Monitored  during session;Limited activity within patient's tolerance     Hand Dominance Right   Extremity/Trunk Assessment Upper Extremity Assessment Upper Extremity Assessment: Generalized weakness   Lower Extremity Assessment Lower Extremity Assessment: Defer to PT evaluation   Cervical / Trunk Assessment Cervical / Trunk Assessment: Normal   Communication Communication Communication: No difficulties   Cognition Arousal/Alertness: Awake/alert Behavior During Therapy: Flat affect Overall Cognitive Status: Within Functional Limits for tasks assessed                                 General Comments: WFL for simple tasks; suspect apparent slowed processing more related to fatigue   General Comments  VSS on RA, BP elevated at 140/97. Husband present and supportive.    Exercises     Shoulder Instructions      Home Living Family/patient expects to be discharged to:: Private residence Living Arrangements: Spouse/significant other Available Help at Discharge: Family;Available 24 hours/day Type of Home: House Home Access: Stairs to enter [employer] of Steps: 2-5 Entrance Stairs-Rails: Right Home Layout: One level     Bathroom Shower/Tub: Occupational psychologist: Handicapped height Bathroom Accessibility: Yes   Home Equipment: Shower seat;Grab bars - tub/shower   Additional Comments: Husband retired and available for 24/7 assist      Prior Functioning/Environment Level of Independence: Independent        Comments: Independent without DME. 1x fall that led to admission, otherwise no falls. retired from work. enjoys reading        OT Problem List: Decreased strength;Decreased activity tolerance;Impaired balance (sitting and/or standing);Decreased coordination;Decreased safety awareness;Decreased knowledge of use of DME or AE      OT Treatment/Interventions: Self-care/ADL training;Therapeutic exercise;Energy conservation;DME and/or AE  instruction;Therapeutic activities;Patient/family education;Balance training    OT Goals(Current goals can be found in the care plan section) Acute Rehab OT Goals Patient Stated Goal: To feel better OT Goal Formulation: With patient/family Time For Goal Achievement: 06/10/21 Potential to Achieve Goals: Good ADL Goals Pt Will Perform Grooming: with modified independence;standing Pt Will Perform Lower Body Bathing: with modified independence;sitting/lateral leans;sit to/from stand Pt Will Perform Lower Body Dressing: with modified independence;sit to/from stand;sitting/lateral leans Pt Will Transfer to Toilet: with modified independence Pt Will Perform Toileting - Clothing Manipulation and hygiene: with modified independence;sitting/lateral leans;sit to/from stand Pt/caregiver will Perform Home Exercise Program: Increased strength;Both right and left upper extremity;Independently;With written HEP provided  OT Frequency: Min 2X/week   Barriers to D/C:            Co-evaluation              AM-PAC OT "6 Clicks" Daily Activity     Outcome Measure Help from another person eating meals?: A Little Help from another person taking care of personal grooming?: A Little Help from another person toileting, which includes using toliet, bedpan, or urinal?: A Little Help from another person bathing (including washing, rinsing, drying)?: A Little Help from another person to put on and taking off regular upper body clothing?: A Little Help from another person to put on and taking off regular lower body clothing?: A Little 6 Click Score: 18   End of Session Equipment Utilized During Treatment: Gait belt Nurse Communication: Mobility status  Activity Tolerance: Patient limited by fatigue Patient left: in bed;with call bell/phone within reach;with nursing/sitter in room;with family/visitor  present  OT Visit Diagnosis: Unsteadiness on feet (R26.81);Other abnormalities of gait and mobility  (R26.89);Muscle weakness (generalized) (M62.81)                Time: 1401-1420 OT Time Calculation (min): 19 min Charges:  OT General Charges $OT Visit: 1 Visit OT Evaluation $OT Eval Moderate Complexity: 1 Mod  Ngai Parcell H., OTR/L Acute Rehabilitation  Michaela Rios Elane Yolanda Bonine 05/26/2021, 2:32 PM

## 2021-05-26 NOTE — Progress Notes (Signed)
PT Cancellation Note  Patient Details Name: HAILEI BESSER MRN: [id number] DOB: May 22, 1951   Cancelled Treatment:    Reason Eval/Treat Not Completed: Patient at procedure or test/unavailable. Will follow-up for PT treatment as schedule permits.  Mabeline Caras, PT, DPT Acute Rehabilitation Services  Pager [phone] Office [place] 05/26/2021, 3:28 PM

## 2021-05-26 NOTE — Progress Notes (Signed)
  Echocardiogram 2D Echocardiogram has been performed.  Michaela Rios 05/26/2021, 5:01 PM

## 2021-05-27 ENCOUNTER — Inpatient Hospital Stay (HOSPITAL_COMMUNITY): Payer: Medicare HMO

## 2021-05-27 DIAGNOSIS — R531 Weakness: Secondary | ICD-10-CM

## 2021-05-27 DIAGNOSIS — I6330 Cerebral infarction due to thrombosis of unspecified cerebral artery: Secondary | ICD-10-CM | POA: Diagnosis present

## 2021-05-27 LAB — CSF CELL COUNT WITH DIFFERENTIAL
RBC Count, CSF: 22 /mm3 — ABNORMAL HIGH
RBC Count, CSF: 26 /mm3 — ABNORMAL HIGH
Tube #: 1
Tube #: 4
WBC, CSF: 2 /mm3 (ref 0–5)
WBC, CSF: 3 /mm3 (ref 0–5)

## 2021-05-27 LAB — BASIC METABOLIC PANEL
Anion gap: 10 (ref 5–15)
BUN: 12 mg/dL (ref 8–23)
CO2: 18 mmol/L — ABNORMAL LOW (ref 22–32)
Calcium: 8.4 mg/dL — ABNORMAL LOW (ref 8.9–10.3)
Chloride: 102 mmol/L (ref 98–111)
Creatinine, Ser: 0.74 mg/dL (ref 0.44–1.00)
GFR, Estimated: >60 mL/min (ref >60)
Glucose, Bld: 97 mg/dL (ref 70–99)
Potassium: 3.4 mmol/L — ABNORMAL LOW (ref 3.5–5.1)
Sodium: 130 mmol/L — ABNORMAL LOW (ref 135–145)

## 2021-05-27 LAB — CBC
HCT: 32.2 % — ABNORMAL LOW (ref 36.0–46.0)
Hemoglobin: 10.7 g/dL — ABNORMAL LOW (ref 12.0–15.0)
MCH: 29.5 pg (ref 26.0–34.0)
MCHC: 33.2 g/dL (ref 30.0–36.0)
MCV: 88.7 fL (ref 80.0–100.0)
Platelets: 249 10*3/uL (ref 150–400)
RBC: 3.63 MIL/uL — ABNORMAL LOW (ref 3.87–5.11)
RDW: 14.9 % (ref 11.5–15.5)
WBC: 7.2 10*3/uL (ref 4.0–10.5)
nRBC: 0 % (ref 0.0–0.2)

## 2021-05-27 LAB — LIPID PANEL
Cholesterol: 96 mg/dL (ref 0–200)
HDL: 24 mg/dL — ABNORMAL LOW (ref >40)
LDL Cholesterol: 45 mg/dL (ref 0–99)
Total CHOL/HDL Ratio: 4 RATIO
Triglycerides: 136 mg/dL (ref <150)
VLDL: 27 mg/dL (ref 0–40)

## 2021-05-27 LAB — CRYPTOCOCCAL ANTIGEN, CSF: Crypto Ag: NEGATIVE

## 2021-05-27 LAB — C-REACTIVE PROTEIN: CRP: 6.8 mg/dL — ABNORMAL HIGH (ref <1.0)

## 2021-05-27 LAB — PROTEIN AND GLUCOSE, CSF
Glucose, CSF: 52 mg/dL (ref 40–70)
Total  Protein, CSF: 82 mg/dL — ABNORMAL HIGH (ref 15–45)

## 2021-05-27 LAB — HEMOGLOBIN A1C
Hgb A1c MFr Bld: 5.9 % — ABNORMAL HIGH (ref 4.8–5.6)
Mean Plasma Glucose: 123 mg/dL

## 2021-05-27 LAB — ANA: Anti Nuclear Antibody (ANA): POSITIVE — AB

## 2021-05-27 LAB — ANTI-DNA ANTIBODY, DOUBLE-STRANDED: ds DNA Ab: 2 IU/mL (ref 0–9)

## 2021-05-27 LAB — HIV ANTIBODY (ROUTINE TESTING W REFLEX): HIV Screen 4th Generation wRfx: NONREACTIVE

## 2021-05-27 LAB — SEDIMENTATION RATE: Sed Rate: 37 mm/hr — ABNORMAL HIGH (ref 0–22)

## 2021-05-27 MED ORDER — POTASSIUM CHLORIDE CRYS ER 20 MEQ PO TBCR
40.0000 meq | EXTENDED_RELEASE_TABLET | Freq: Once | ORAL | Status: AC
  Administered 2021-05-27: 40 meq via ORAL
  Filled 2021-05-27: qty 2

## 2021-05-27 MED ORDER — VITAMIN B-12 1000 MCG PO TABS
1000.0000 ug | ORAL_TABLET | Freq: Every day | ORAL | Status: DC
  Administered 2021-05-27 – 2021-06-08 (×13): 1000 ug via ORAL
  Filled 2021-05-27 (×13): qty 1

## 2021-05-27 MED ORDER — LACTATED RINGERS IV BOLUS
500.0000 mL | Freq: Once | INTRAVENOUS | Status: AC
  Administered 2021-05-27: 500 mL via INTRAVENOUS

## 2021-05-27 MED ORDER — LIDOCAINE HCL 1 % IJ SOLN
5.0000 mL | Freq: Once | INTRAMUSCULAR | Status: DC
  Filled 2021-05-27: qty 5

## 2021-05-27 MED ORDER — LIDOCAINE HCL (PF) 1 % IJ SOLN
5.0000 mL | Freq: Once | INTRAMUSCULAR | Status: AC
  Administered 2021-05-27: 5 mL via INTRADERMAL
  Filled 2021-05-27: qty 30

## 2021-05-27 NOTE — Evaluation (Signed)
Clinical/Bedside Swallow Evaluation Patient Details  Name: Michaela Rios MRN: [id number] Date of Birth: 1951-01-11  Today's Date: 05/27/2021 Time: SLP Start Time (ACUTE ONLY): 69 SLP Stop Time (ACUTE ONLY): 1340 SLP Time Calculation (min) (ACUTE ONLY): 25 min  Past Medical History:  Past Medical History:  Diagnosis Date   Anemia    Anxiety    Arthritis    Cataract    Chronic migraine    follows with neuro for same   Clotting disorder (HCC)    clot in ovarian vein- hx   Esophagitis    GERD (gastroesophageal reflux disease)    Hypertension    IBS (irritable bowel syndrome)    Low back pain syndrome    MRSA (methicillin resistant Staphylococcus aureus) infection 04/17/12   left torso   Peptic ulcer disease 2003, 12/2012   Peripheral vascular disease ([place])    Vitamin D deficiency    Past Surgical History:  Past Surgical History:  Procedure Laterality Date   ABDOMINAL HYSTERECTOMY     LAPAROSCOPIC BILATERAL SALPINGO OOPHERECTOMY     pud surgery w/duod sticture-plasty and vagotomy  2003   Dr. Marlou Starks   TONSILLECTOMY AND ADENOIDECTOMY     as a child   TUBAL LIGATION  1984   HPI:  Pt is a 70 y.o. female admitted 05/24/21 with c/o weakness, fall due to loss of balance, poor PO intake. Workup for sepsis, unclear etiology. Pt with confusion, neuro consult for possible CNS infection. Brain MRI 6/29 showed R basal ganglia stroke in addition to accelerated small vessel disease. PMH includes HTN, CKD 3, migraines, chronic LBP, prediabetes, anxiety, depression. Per RN pt with novel difficulty during PO medicine consumption prompting request for clinical swallowing assessment   Assessment / Plan / Recommendation Clinical Impression  SLP assessed pt swallow function as RN noted difficulty with PO medicines this am. Pt denies current difficulty with swallowing. Oral motor exam unremarkable. Pt upright in chair at bedside. Assessed with ice chips, thin liquids, puree, and solid PO. Pt  exhibited slowed oral transit, with minimal chew/reduced mastication however appeared to completely clear oral cavity with extended time. Swallow initiation per palpation appeared delayed however not overt s/sx of aspiration were exhibited. Recommend continue regular thin liquid diet with meds as tolerated. SLP to follow up for diet tolerance with meal observation and completion of Speech Language Evaluation. SLP Visit Diagnosis: Dysphagia, oral phase (R13.11);Dysphagia, unspecified (R13.10)    Aspiration Risk  Mild aspiration risk    Diet Recommendation   Regular, thin liquids   Medication Administration: Other (Comment) (as tolerated; crush as needed)    Other  Recommendations Oral Care Recommendations: Oral care BID   Follow up Recommendations  (per PT/OT recommendations)      Frequency and Duration min 1 x/week  1 week       Prognosis Prognosis for Safe Diet Advancement: Good Barriers to Reach Goals: Time post onset;Cognitive deficits      Swallow Study   General Date of Onset: 05/26/21 HPI: Pt is a 70 y.o. female admitted 05/24/21 with c/o weakness, fall due to loss of balance, poor PO intake. Workup for sepsis, unclear etiology. Pt with confusion, neuro consult for possible CNS infection. Brain MRI 6/29 showed R basal ganglia stroke in addition to accelerated small vessel disease. PMH includes HTN, CKD 3, migraines, chronic LBP, prediabetes, anxiety, depression. Per RN pt with novel difficulty during PO medicine consumption prompting request for clinical swallowing assessment Type of Study: Bedside Swallow Evaluation Previous Swallow Assessment: none  on file Diet Prior to this Study: Regular;Thin liquids (RN holding lunch tray until clinical swallow eval) Temperature Spikes Noted: No Respiratory Status: Room air History of Recent Intubation: No Behavior/Cognition: Alert;Cooperative Oral Cavity Assessment: Within Functional Limits Oral Care Completed by SLP: No Oral Cavity -  Dentition: Adequate natural dentition Vision: Functional for self-feeding Self-Feeding Abilities: Able to feed self Patient Positioning: Upright in chair Baseline Vocal Quality: Low vocal intensity Volitional Swallow: Able to elicit    Oral/Motor/Sensory Function Overall Oral Motor/Sensory Function: Within functional limits   Ice Chips Ice chips: Impaired Presentation: Spoon Oral Phase Impairments: Reduced lingual movement/coordination Oral Phase Functional Implications: Prolonged oral transit Pharyngeal Phase Impairments: Multiple swallows;Throat Clearing - Delayed   Thin Liquid Thin Liquid: Impaired Presentation: Cup;Straw Oral Phase Impairments: Reduced lingual movement/coordination Pharyngeal  Phase Impairments: Suspected delayed Swallow;Multiple swallows    Nectar Thick Nectar Thick Liquid: Not tested   Honey Thick Honey Thick Liquid: Not tested   Puree Puree: Within functional limits Presentation: Self Fed   Solid     Solid: Impaired Presentation: Self Fed Oral Phase Impairments: Reduced lingual movement/coordination Oral Phase Functional Implications: Prolonged oral transit Pharyngeal Phase Impairments: Suspected delayed Swallow;Multiple swallows     Hayden Rasmussen MA, CCC-SLP Acute Rehabilitation Services   05/27/2021,1:44 PM

## 2021-05-27 NOTE — Progress Notes (Signed)
Paged neurology and spoke with Dr. Tyrell Antonio this AM concerning change in NIH score and that pt had a hard time swallowing medication. Speech therapy was also paged for evaluation.

## 2021-05-27 NOTE — Care Management Important Message (Signed)
Important Message  Patient Details  Name: Michaela Rios MRN: [id number] Date of Birth: [birth year]   Medicare Important Message Given:  Yes     Orbie Pyo 05/27/2021, 3:48 PM

## 2021-05-27 NOTE — Progress Notes (Signed)
EEG Completed; Results Pending  

## 2021-05-27 NOTE — Procedures (Addendum)
Patient Name: Michaela Rios  MRN: [id number]  Epilepsy Attending: Lora Havens  Referring Physician/Provider: Dr Rosalin Hawking Date: 05/27/2021 Duration: 24.11 mins  Patient history: 70yo F presented with falling at home, generalized weakness, fever, difficult waking with imbalance, lack of appetite. MRI showed hyperintensity and mild enlargement of the left head of caudate. EEG to evaluate for seizure  Level of alertness: Awake  AEDs during EEG study: TPM  Technical aspects: This EEG study was done with scalp electrodes positioned according to the 10-20 International system of electrode placement. Electrical activity was acquired at a sampling rate of 500Hz  and reviewed with a high frequency filter of 70Hz  and a low frequency filter of 1Hz . EEG data were recorded continuously and digitally stored.   Description: The posterior dominant rhythm consists of 9 Hz activity of moderate voltage (25-35 uV) seen predominantly in posterior head regions, symmetric and reactive to eye opening and eye closing. EEG showed intermittent 3 to 6 Hz theta-delta slowing in left temporal  region. Single sharp transient was seen in left temporal region. Hyperventilation and photic stimulation were not performed.     ABNORMALITY - Intermittent slow, generalized  IMPRESSION: This study is suggestive of cortical dysfunction arising from left temporal region, nonspecific etiology. No seizures or definite epileptiform discharges were seen throughout the recording.  If suspicion for interictal activity remains a concern, a prolonged study including sleep should be considered.   Toneshia Coello Barbra Sarks

## 2021-05-27 NOTE — Evaluation (Signed)
Speech Language Pathology Evaluation Patient Details Name: Michaela Rios MRN: [id number] DOB: 1951-07-23 Today's Date: 05/27/2021 Time: 70-1405 SLP Time Calculation (min) (ACUTE ONLY): 20 min  Problem List:  Patient Active Problem List   Diagnosis Date Noted   Cerebral thrombosis with cerebral infarction 05/27/2021   Sepsis ([place]) 05/24/2021   Tachycardia 05/24/2021   Urinary frequency 04/07/2021   Numbness and tingling in right hand 09/08/2020   Rhabdomyolysis 08/18/2020   Nausea & vomiting 08/18/2020   Intractable nausea and vomiting 08/18/2020   Elevated LFTs 08/17/2020   Hypokalemia 08/17/2020   Post herpetic neuralgia 06/30/2020   Urinary urgency 04/06/2020   Left lumbar radiculopathy 06/04/2018   Pain management contract signed 06/03/2018   Encounter for pain management 03/05/2018   Memory difficulties 03/05/2018   Sialadenitis, right parotid 06/24/2017   Insomnia 05/12/2017   Prediabetes 11/08/2016   Vitamin D deficiency 06/08/2009   Depression 01/07/2009   PEPTIC ULCER DISEASE, CHRONIC 01/07/2009   OTHER OBSTRUCTION OF DUODENUM 01/07/2009   HIATAL HERNIA 01/07/2009   Irritable bowel syndrome 01/07/2009   DEGENERATIVE JOINT DISEASE 01/07/2009   ANEMIA, HX OF 01/07/2009   PULMONARY NODULE 06/09/2008   Anxiety 04/01/2008   Essential hypertension 04/01/2008   Allergic rhinitis 04/01/2008   GERD 04/01/2008   LOW BACK PAIN SYNDROME 04/01/2008   Migraines 04/01/2008   Past Medical History:  Past Medical History:  Diagnosis Date   Anemia    Anxiety    Arthritis    Cataract    Chronic migraine    follows with neuro for same   Clotting disorder (HCC)    clot in ovarian vein- hx   Esophagitis    GERD (gastroesophageal reflux disease)    Hypertension    IBS (irritable bowel syndrome)    Low back pain syndrome    MRSA (methicillin resistant Staphylococcus aureus) infection 04/17/12   left torso   Peptic ulcer disease 2003, 12/2012   Peripheral vascular  disease ([place])    Vitamin D deficiency    Past Surgical History:  Past Surgical History:  Procedure Laterality Date   ABDOMINAL HYSTERECTOMY     LAPAROSCOPIC BILATERAL SALPINGO OOPHERECTOMY     pud surgery w/duod sticture-plasty and vagotomy  2003   Dr. Marlou Starks   TONSILLECTOMY AND ADENOIDECTOMY     as a child   TUBAL LIGATION  1984   HPI:  Pt is a 70 y.o. female admitted 05/24/21 with c/o weakness, fall due to loss of balance, poor PO intake. Workup for sepsis, unclear etiology. Pt with confusion, neuro consult for possible CNS infection. Brain MRI 6/29 showed R basal ganglia stroke in addition to accelerated small vessel disease. PMH includes HTN, CKD 3, migraines, chronic LBP, prediabetes, anxiety, depression.   Assessment / Plan / Recommendation Clinical Impression  Pt presents with novel cognitive deficits post CVA. [employer] Mental Status (SLUMS) administered: 16/30 (of note <27 considered impairment based on pts educational hx). Impairments this date exhibited in immediate and delayed recall, mental manipulation, attention, reduced executive function skills, and problem solving. Pt denies changes from baseline, while spouse notices some mild changes. During clock drawing task pt exhibited complete left sided neglect without awareness. Pts auditory comprehension, verbal expression, and motor speech skills appeared grossly intact. Per pt and spouse report, PLOF pt was fully independent with all ADLs and high functioning. Continued ST needs indicated for deficits mentioned above.    SLP Assessment  SLP Recommendation/Assessment: Patient needs continued Speech Lanaguage Pathology Services SLP Visit Diagnosis: Cognitive  communication deficit (R41.841)    Follow Up Recommendations  Inpatient Rehab    Frequency and Duration min 1 x/week  2 weeks      SLP Evaluation Cognition  Overall Cognitive Status: Impaired/Different from baseline Arousal/Alertness:  Awake/alert Orientation Level: Oriented to person;Oriented to place;Oriented to time;Disoriented to situation Attention: Focused;Sustained Focused Attention: Impaired Sustained Attention: Impaired Memory: Impaired Memory Impairment: Decreased recall of new information;Decreased short term memory Awareness: Impaired Problem Solving: Impaired Executive Function: Organizing;Self Monitoring Organizing: Impaired Self Monitoring: Impaired Safety/Judgment: Impaired       Comprehension  Auditory Comprehension Overall Auditory Comprehension: Appears within functional limits for tasks assessed    Expression Expression Primary Mode of Expression: Verbal Verbal Expression Overall Verbal Expression: Appears within functional limits for tasks assessed Written Expression Dominant Hand: Right   Oral / Motor  Oral Motor/Sensory Function Overall Oral Motor/Sensory Function: Within functional limits Motor Speech Overall Motor Speech: Appears within functional limits for tasks assessed   GO                    Hayden Rasmussen MA, CCC-SLP Acute Rehabilitation Services   05/27/2021, 2:29 PM

## 2021-05-27 NOTE — Progress Notes (Signed)
Pharmacy Antibiotic Note  Michaela Rios is a 70 y.o. female admitted on 05/24/2021 with sepsis; unknown source.  Pharmacy has been consulted for aztreonam and vancomycin dosing and metronidazole ordered. ABX now narrowed to aztreonam alone. Cultures and workup unrevealing.  Plan: Continue aztreonam 2g IV q8h F/U LOT and narrowing to PO if further antibiotics needed   Height: 5\' 3"  (160 cm) Weight: 60.8 kg (134 lb) IBW/kg (Calculated) : 52.4  Temp (24hrs), Avg:98.7 F (37.1 C), Min:98.4 F (36.9 C), Max:99.3 F (37.4 C)  Recent Labs  Lab 05/24/21 1005 05/24/21 1051 05/24/21 1227 05/25/21 0155 05/26/21 0236 05/27/21 0106  WBC 8.6  --   --  8.1 6.1 7.2  CREATININE 0.77  --   --  0.71 0.70 0.74  LATICACIDVEN  --  2.1* 1.5  --   --   --      Estimated Creatinine Clearance: 54.1 mL/min (by C-G formula based on SCr of 0.74 mg/dL).    Allergies  Allergen Reactions   Amitriptyline Hcl     Changed personality   Shellfish Allergy Nausea And Vomiting and Other (See Comments)    Tested "high" on allergy test   Amoxicillin Hives   Cephalexin Hives   Lisinopril Cough    .   Penicillins Hives    Antimicrobials this admission: Gentamicin x 1 dose in ED 6/27 Vancomycin 6/27 >>6/29 Metronidazole 6/27 >> 6/29 Aztreonam 6/27 >>  Dose adjustments this admission: N/A  Microbiology results: Pending  Thank you for allowing pharmacy to be a part of this patient's care.   Arrie Senate, PharmD, BCPS, [facility] Clinical Pharmacist [phone] Please check AMION for all [place] numbers 05/27/2021

## 2021-05-27 NOTE — Progress Notes (Signed)
Pt's NIH score increased to 3 and neurology has been made aware at bedside.

## 2021-05-27 NOTE — Progress Notes (Signed)
Rehab Admissions Coordinator Note:  Patient was screened by Cleatrice Burke for appropriateness for an Inpatient Acute Rehab Consult per therapy change in recommendation.  At this time, we are recommending Inpatient Rehab consult. I will place order per protocol.  Cleatrice Burke RN MSN 05/27/2021, 11:36 AM  I can be reached at [phone].

## 2021-05-27 NOTE — Care Management Important Message (Signed)
Important Message  Patient Details  Name: Michaela Rios MRN: [id number] Date of Birth: 1951-07-19   Medicare Important Message Given:  Yes     Orbie Pyo 05/27/2021, 3:46 PM

## 2021-05-27 NOTE — Progress Notes (Signed)
PROGRESS NOTE    Michaela Rios  ZOX:[id number] DOB: Dec 03, 1950 DOA: 05/24/2021 PCP: Binnie Rail, MD   Brief Narrative: 70 year old with past medical history significant for hypertension, prediabetes, depression, CKD stage II, history of PUD, chronic low back pain, migraine who presented to the ED with generalized weakness and loss of balance.  Patient fell on Sunday while trying to go to the bathroom and lost her balance.  She has been having shivering.  She denies cough, dysuria, abdominal pain.  She developed diarrhea overnight.  Patient was found to have high fever 101.4 on admission.  Elevated D-dimer.  CTA negative for PE.  Dopplers lower extremity negative for DVT. Admitted with SIRS criteria unclear etiology.   Assessment & Plan:   Principal Problem:   Sepsis ([place]) Active Problems:   Anxiety   Depression   Essential hypertension   Prediabetes   Hypokalemia   Tachycardia  1-SIRS;  -Presents with fever (6/27 tempeture 101) , tachycardia, tachypnea,  mild elevation of lactic acid. Confusion. -She had a second reading tempeture at 102, that was probably not accurate, because few minutes after recheck was normal.  -Chest x ray negative for PNA. UA; negative for UTI. COVID PCR negative -Urine culture no growth to date. and blood cultures: no growth to date.  -CT abdomen and pelvis: Negative for acute infectious process, hepatic asteatosis -No Nuchal rigidity. Complaints of chronic migraines.  -Continue with IV antibiotics aztreonam. Vancomycin and flagyl discontinue 6/29. Plan to complete 5 days antibiotics.  -ANA, Double strand DNA, RF. Pending.   2-Acute infarct Right basal Ganglia:  Acute metabolic encephalopathy;  -Husband report patient had confusion episode 2/29. -CT: New multifocal hypoattenuation in the basilar ganglia and thalami bilaterally of indeterminate etiology.  Consideration include multifocal infarcts, toxic metabolic insults, hypoxic ischemic injury and  infection.  No mass-effect to suggest tumor. Correlate  with MRI.  -MRI: Right basal ganglia infarct, hyperintensity and mild enlargement of the left head of caudate, left thalamus and a small amount of hyperintensity in the right medial thalamus.  Vasogenic type edema in the deep white matter structures on the left.  Recommend postcontrast MRI to further evaluate. -MRI with Contrast: Mild, largely vascular/perivascular appearing enhancement in the areas of signal abnormality in the deep gray nuclei as well as a small amount of leptomeningeal enhancement over the right and possibly left parietal convexities.  No solid mass enhancing.  Differential Diagnosis: Cryptococcus, vasculitis, or unusual neoplasm and so as angiocentric lymphoma and granulomatosis/autoimmune or other inflammatory condition.  -plan to proceed with LP and EEG.  -LDL ;45. Discussed with Neurology no need for statins, LDL below goal.  -Continue with aspirin -No need for permissive HTN./   3-Abnormal enhancement deep gray nuclei and leptomeningeal enlargement. :  -LP today.  -EEG Labs; autoimmune work up: RF, Sjogrens, ANCA, HIV, CRP, ESR, ANA, Double strand DNA, B 1;   Hypokalemia; replete orally.   HTN;  Continue with Norvasc, hydralazine and metoprolol.//  Tachycardia; will give IV bolus   Generalized weakness.  IV fluids.  PT , OT  B 12. Low normal , will start supplement.   Depression;  Continue with Wellbutrin, Effexor.   CKD stage II Stable.   Migraines; continue with topiromate.    Pulmonary Nodule. Needs to follow up with PCP.  Positive D dimer; Doppler LE negative for DVT> CTA negative for PE>   Nutrition Problem: Inadequate oral intake Etiology: decreased appetite    Signs/Symptoms: per patient/family report, meal completion < 25%  Interventions: Ensure Enlive (each supplement provides 350kcal and 20 grams of protein), Magic cup, MVI  Estimated body mass index is 23.74 kg/m as calculated  from the following:   Height as of this encounter: _0  (1.6 m).   Weight as of this encounter: 60.8 kg.   DVT prophylaxis: Lovenox Code Status: Full code Family Communication: Husband who was at bedside Disposition Plan:  Status is: Inpatient  Remains inpatient appropriate because:IV treatments appropriate due to intensity of illness or inability to take PO  Dispo: The patient is from: Home              Anticipated d/c is to:  [facility]IR              Patient currently is not medically stable to d/c.   Difficult to place patient No        Consultants:  None  Procedures:  none  Antimicrobials:  Vancomycin Aztreonam.   Subjective:  She feels better today. She had trouble swallowing pills today. She has never had this problem before. Plan to get speech therapist.  She was notice to have mild left side weakness today, leaning to left side. Discussed with neurologist no need to repeat Imagine, because symptoms are explain by stroke on recent MRI>   Objective: Vitals:   05/27/21 0200 05/27/21 0355 05/27/21 0755 05/27/21 0819  BP: (!) 151/104 (!) 142/94 (!) 155/96 (!) 152/103  Pulse: 95  (!) 103 (!) 103  Resp: _1 Temp:  98.6 F (37 C) 99.3 F (37.4 C)   TempSrc:  Oral Oral   SpO2: 97% 95% 97%   Weight:      Height:        Intake/Output Summary (Last 24 hours) at 05/27/2021 1112 Last data filed at 05/27/2021 0500 Gross per 24 hour  Intake 2614.76 ml  Output 750 ml  Net 1864.76 ml    Filed Weights   05/24/21 0956  Weight: 60.8 kg    Examination:  General exam: NAD Respiratory system: CTA Cardiovascular system: S 1, S 2 RRR Gastrointestinal system: Bs present, soft, nt Central nervous system: Alert, oriented, following command. Left side 4-5/5/  Extremities: no edema    Data Reviewed: I have personally reviewed following labs and imaging studies  CBC: Recent Labs  Lab 05/24/21 1005 05/25/21 0155 05/26/21 0236 05/27/21 0106  WBC 8.6 8.1 6.1  7.2  HGB 11.6* 10.5* 11.1* 10.7*  HCT 35.8* 32.9* 34.3* 32.2*  MCV 92.0 93.2 91.0 88.7  PLT 243 199 221 269    Basic Metabolic Panel: Recent Labs  Lab 05/24/21 1005 05/24/21 1940 05/25/21 0155 05/26/21 0236 05/27/21 0106  NA 139  --  137 132* 130*  K 2.8*  --  3.9 3.6 3.4*  CL 105  --  106 103 102  CO2 22  --  24 18* 18*  GLUCOSE 121*  --  101* 94 97  BUN 9  --  5* 8 12  CREATININE 0.77  --  0.71 0.70 0.74  CALCIUM 9.3  --  8.7* 8.9 8.4*  MG  --  2.3  --   --   --     GFR: Estimated Creatinine Clearance: 54.1 mL/min (by C-G formula based on SCr of 0.74 mg/dL). Liver Function Tests: Recent Labs  Lab 05/24/21 1005 05/26/21 0236  AST 24 25  ALT 16 13  ALKPHOS 75 54  BILITOT 0.5 0.6  PROT 7.7 6.4*  ALBUMIN 3.4* 2.9*    No  results for input(s): LIPASE, AMYLASE in the last 168 hours. No results for input(s): AMMONIA in the last 168 hours. Coagulation Profile: Recent Labs  Lab 05/24/21 1150  INR 1.1    Cardiac Enzymes: No results for input(s): CKTOTAL, CKMB, CKMBINDEX, TROPONINI in the last 168 hours. BNP (last 3 results) No results for input(s): PROBNP in the last 8760 hours. HbA1C: No results for input(s): HGBA1C in the last 72 hours. CBG: No results for input(s): GLUCAP in the last 168 hours. Lipid Profile: Recent Labs    05/27/21 0106  CHOL 96  HDL 24*  LDLCALC 45  TRIG 136  CHOLHDL 4.0   Thyroid Function Tests: Recent Labs    05/26/21 1603  TSH 1.904    Anemia Panel: Recent Labs    05/26/21 1603  VITAMINB12 254   Sepsis Labs: Recent Labs  Lab 05/24/21 1051 05/24/21 1227 05/24/21 1940  PROCALCITON  --   --  <0.10  LATICACIDVEN 2.1* 1.5  --      Recent Results (from the past 240 hour(s))  Blood culture (routine x 2)     Status: None (Preliminary result)   Collection Time: 05/24/21 10:51 AM   Specimen: BLOOD  Result Value Ref Range Status   Specimen Description BLOOD RIGHT ANTECUBITAL  Final   Special Requests   Final     BOTTLES DRAWN AEROBIC AND ANAEROBIC Blood Culture adequate volume   Culture   Final    NO GROWTH 3 DAYS Performed at [facility], [place] [address] [place], [place] [address]    Report Status PENDING  Incomplete  Resp Panel by RT-PCR (Flu A&B, Covid) Nasopharyngeal Swab     Status: None   Collection Time: 05/24/21 11:14 AM   Specimen: Nasopharyngeal Swab; Nasopharyngeal(NP) swabs in vial transport medium  Result Value Ref Range Status   SARS Coronavirus 2 by RT PCR NEGATIVE NEGATIVE Final    Comment: (NOTE) SARS-CoV-2 target nucleic acids are NOT DETECTED.  The SARS-CoV-2 RNA is generally detectable in upper respiratory specimens during the acute phase of infection. The lowest concentration of SARS-CoV-2 viral copies this assay can detect is 138 copies/mL. A negative result does not preclude SARS-Cov-2 infection and should not be used as the sole basis for treatment or other patient management decisions. A negative result may occur with  improper specimen collection/handling, submission of specimen other than nasopharyngeal swab, presence of viral mutation(s) within the areas targeted by this assay, and inadequate number of viral copies(<138 copies/mL). A negative result must be combined with clinical observations, patient history, and epidemiological information. The expected result is Negative.  Fact Sheet for Patients:  EntrepreneurPulse.com.au  Fact Sheet for Healthcare Providers:  IncredibleEmployment.be  This test is no t yet approved or cleared by the [place] FDA and  has been authorized for detection and/or diagnosis of SARS-CoV-2 by FDA under an Emergency Use Authorization (EUA). This EUA will remain  in effect (meaning this test can be used) for the duration of the COVID-19 declaration under Section 564(b)(1) of the Act, 21 U.S.C.section 360bbb-3(b)(1), unless the authorization is terminated  or revoked sooner.        Influenza A by PCR NEGATIVE NEGATIVE Final   Influenza B by PCR NEGATIVE NEGATIVE Final    Comment: (NOTE) The Xpert Xpress SARS-CoV-2/FLU/RSV plus assay is intended as an aid in the diagnosis of influenza from Nasopharyngeal swab specimens and should not be used as a sole basis for treatment. Nasal washings and aspirates are unacceptable for Xpert  Xpress SARS-CoV-2/FLU/RSV testing.  Fact Sheet for Patients: EntrepreneurPulse.com.au  Fact Sheet for Healthcare Providers: IncredibleEmployment.be  This test is not yet approved or cleared by the [place] FDA and has been authorized for detection and/or diagnosis of SARS-CoV-2 by FDA under an Emergency Use Authorization (EUA). This EUA will remain in effect (meaning this test can be used) for the duration of the COVID-19 declaration under Section 564(b)(1) of the Act, 21 U.S.C. section 360bbb-3(b)(1), unless the authorization is terminated or revoked.  Performed at [facility], [place] [address], [place], [place] [address]   Blood culture (routine x 2)     Status: None (Preliminary result)   Collection Time: 05/24/21 12:03 PM   Specimen: BLOOD  Result Value Ref Range Status   Specimen Description BLOOD LEFT ANTECUBITAL  Final   Special Requests   Final    BOTTLES DRAWN AEROBIC AND ANAEROBIC Blood Culture adequate volume   Culture   Final    NO GROWTH 3 DAYS Performed at [facility], [place] [address] [place], [place] [address]    Report Status PENDING  Incomplete  Urine culture     Status: None   Collection Time: 05/24/21 12:48 PM   Specimen: Urine, Clean Catch  Result Value Ref Range Status   Specimen Description URINE, CLEAN CATCH  Final   Special Requests NONE  Final   Culture   Final    NO GROWTH Performed at [facility], [place] [address], [place], [place] [address]    Report Status 05/25/2021 FINAL  Final          Radiology Studies: CT ANGIO HEAD W OR WO  CONTRAST  Result Date: 05/26/2021 CLINICAL DATA:  Stroke suspected. EXAM: CT ANGIOGRAPHY HEAD AND NECK TECHNIQUE: Multidetector CT imaging of the head and neck was performed using the standard protocol during bolus administration of intravenous contrast. Multiplanar CT image reconstructions and MIPs were obtained to evaluate the vascular anatomy. Carotid stenosis measurements (when applicable) are obtained utilizing NASCET criteria, using the distal internal carotid diameter as the denominator. CONTRAST:  62m OMNIPAQUE IOHEXOL 350 MG/ML SOLN COMPARISON:  None. FINDINGS: CTA NECK FINDINGS Aortic arch: Standard 3 vessel aortic arch with mild atherosclerotic plaque. Widely patent arch vessel origins. Right carotid system: Patent without evidence of stenosis, dissection, or significant atherosclerosis. Left carotid system: Patent with minimal calcified plaque at the carotid bifurcation. No evidence of dissection or stenosis. Tortuous mid cervical ICA. Vertebral arteries: Patent with the right being slightly larger than the left. No evidence of dissection or stenosis. Skeleton: Advanced disc degeneration at C5-6. Advanced facet arthrosis at C4-5 with grade 1 anterolisthesis. Other neck: No evidence of cervical lymphadenopathy or mass. Upper chest: No apical lung consolidation or mass. Review of the MIP images confirms the above findings CTA HEAD FINDINGS Anterior circulation: The internal carotid arteries are patent from skull base to carotid termini with minimal nonstenotic plaque. ACAs and MCAs are patent without evidence of a proximal branch occlusion or significant proximal stenosis. No aneurysm is identified. Posterior circulation: The intracranial vertebral arteries are widely patent to the basilar. Patent bilateral PICA, left AICA, and bilateral SCA origins are visualized. A right AICA is not clearly identified. The basilar artery is widely patent. There are small posterior communicating arteries bilaterally.  Both PCAs are patent without evidence of a significant proximal stenosis. No aneurysm is identified. Venous sinuses: As permitted by contrast timing, patent. Anatomic variants: None. Review of the MIP images confirms the above findings  IMPRESSION: 1. Minimal atherosclerosis in the head and neck without large vessel occlusion or significant stenosis. 2. Aortic Atherosclerosis (ICD10-I70.0). Electronically Signed   By: Logan Bores M.D.   On: 05/26/2021 20:00   CT HEAD WO CONTRAST  Result Date: 05/26/2021 CLINICAL DATA:  Headache. EXAM: CT HEAD WITHOUT CONTRAST TECHNIQUE: Contiguous axial images were obtained from the base of the skull through the vertex without intravenous contrast. COMPARISON:  08/17/2020 FINDINGS: Brain: There is new multifocal hypoattenuation in the basal ganglia and thalami bilaterally. A band of low-density involving the right caudate and lentiform nuclei has an appearance suggestive of an acute lateral lenticulostriate territory infarct. No acute cortically based infarct, intracranial hemorrhage, midline shift, or extra-axial fluid collection is identified. Hypodensities in the periventricular white matter bilaterally are nonspecific but compatible with mild chronic small vessel ischemic disease. Vascular: No hyperdense vessel. Skull: No fracture or suspicious osseous lesion. Sinuses/Orbits: Visualized paranasal sinuses and mastoid air cells are clear. Unremarkable orbits. Other: None. IMPRESSION: New multifocal hypoattenuation in the basal ganglia and thalami bilaterally of indeterminate etiology. Considerations include multifocal infarcts (including venous infarcts), toxic/metabolic insult, hypoxic-ischemic injury, and infection. No mass effect to suggest tumor. Correlate with pending MRI. Electronically Signed   By: Logan Bores M.D.   On: 05/26/2021 15:20   CT ANGIO NECK W OR WO CONTRAST  Result Date: 05/26/2021 CLINICAL DATA:  Stroke suspected. EXAM: CT ANGIOGRAPHY HEAD AND NECK  TECHNIQUE: Multidetector CT imaging of the head and neck was performed using the standard protocol during bolus administration of intravenous contrast. Multiplanar CT image reconstructions and MIPs were obtained to evaluate the vascular anatomy. Carotid stenosis measurements (when applicable) are obtained utilizing NASCET criteria, using the distal internal carotid diameter as the denominator. CONTRAST:  19m OMNIPAQUE IOHEXOL 350 MG/ML SOLN COMPARISON:  None. FINDINGS: CTA NECK FINDINGS Aortic arch: Standard 3 vessel aortic arch with mild atherosclerotic plaque. Widely patent arch vessel origins. Right carotid system: Patent without evidence of stenosis, dissection, or significant atherosclerosis. Left carotid system: Patent with minimal calcified plaque at the carotid bifurcation. No evidence of dissection or stenosis. Tortuous mid cervical ICA. Vertebral arteries: Patent with the right being slightly larger than the left. No evidence of dissection or stenosis. Skeleton: Advanced disc degeneration at C5-6. Advanced facet arthrosis at C4-5 with grade 1 anterolisthesis. Other neck: No evidence of cervical lymphadenopathy or mass. Upper chest: No apical lung consolidation or mass. Review of the MIP images confirms the above findings CTA HEAD FINDINGS Anterior circulation: The internal carotid arteries are patent from skull base to carotid termini with minimal nonstenotic plaque. ACAs and MCAs are patent without evidence of a proximal branch occlusion or significant proximal stenosis. No aneurysm is identified. Posterior circulation: The intracranial vertebral arteries are widely patent to the basilar. Patent bilateral PICA, left AICA, and bilateral SCA origins are visualized. A right AICA is not clearly identified. The basilar artery is widely patent. There are small posterior communicating arteries bilaterally. Both PCAs are patent without evidence of a significant proximal stenosis. No aneurysm is identified.  Venous sinuses: As permitted by contrast timing, patent. Anatomic variants: None. Review of the MIP images confirms the above findings IMPRESSION: 1. Minimal atherosclerosis in the head and neck without large vessel occlusion or significant stenosis. 2. Aortic Atherosclerosis (ICD10-I70.0). Electronically Signed   By: ALogan BoresM.D.   On: 05/26/2021 20:00   MR BRAIN WO CONTRAST  Result Date: 05/26/2021 CLINICAL DATA:  Acute neuro deficit.  Abnormal CT.  Fever EXAM: MRI HEAD WITHOUT  CONTRAST TECHNIQUE: Multiplanar, multiecho pulse sequences of the brain and surrounding structures were obtained without intravenous contrast. COMPARISON:  CT head 02/29/2022, 08/17/2020 FINDINGS: Brain: Focal area of restricted diffusion in the right lenticulostriate distribution compatible with acute infarct. This area also shows increased signal on T2 and FLAIR. Mild edema in the deep white matter tracts on the right. Small amount of FLAIR hyperintensity right medial thalamus. Additional areas of increased signal on T2 and FLAIR involving the head of caudate on the left and left thalamus without restricted diffusion. There is mild enlargement of the head of the caudate on the left. There is also asymmetric white matter hyperintensity in the deep white matter tracks of the left external capsule and left internal capsule. These areas do not show restricted diffusion. Ventricle size normal.  No midline shift.  Negative for hemorrhage Vascular: Normal arterial flow voids. Skull and upper cervical spine: Negative Sinuses/Orbits: Minimal mucosal edema paranasal sinuses. Negative orbit Other: None IMPRESSION: Restricted diffusion in the right basal ganglia corresponding to lenticulostriate territory. This is most consistent with acute infarction Hyperintensity and mild enlargement of the left head of caudate. Hyperintensity also in the left thalamus and a small amount of hyperintensity in the right medial thalamus. There is vasogenic  type edema in the deep white matter structures on the left and to lesser extent in the deep white matter structures on the right. No restricted diffusion on the left. This is an unusual pattern. I would recommend postcontrast imaging of the brain to rule out tumor on the left. Subacute ischemia on the left appears unlikely given the vasogenic edema and unusual appearance of the head of the caudate and thalamus on the left. Also consider infection/cerebritis. Opportunistic infection such as toxoplasmosis possible. These results were called by telephone at the time of interpretation on 05/26/2021 at 4:10 pm to provider [facility], who verbally acknowledged these results. Electronically Signed   By: Franchot Gallo M.D.   On: 05/26/2021 16:10   MR BRAIN W CONTRAST  Result Date: 05/26/2021 CLINICAL DATA:  Follow-up abnormal brain MRI earlier today. EXAM: MRI HEAD WITH CONTRAST TECHNIQUE: Multiplanar, multiecho pulse sequences of the brain and surrounding structures were obtained with intravenous contrast. CONTRAST:  6.26m GADAVIST GADOBUTROL 1 MMOL/ML IV SOLN COMPARISON:  Noncontrast brain MRI earlier today FINDINGS: There is very mild enhancement within the previously seen regions of signal abnormality in the basal ganglia and thalami. Much of this enhancement appears vascular and perivascular. No solid enhancing mass is present. There is focal abnormal leptomeningeal enhancement over the right parietal convexity (series 6, image 6), and there may also be minimal leptomeningeal enhancement over the left parietal convexity (series 6, image 19). IMPRESSION: Mild, largely vascular/perivascular appearing enhancement in the areas of signal abnormality in the deep gray nuclei as well as a small amount of leptomeningeal enhancement over the right and possibly left parietal convexities. No solid enhancing mass. Differential diagnosis includes infection (including cryptococcus), vasculitis, unusual neoplasm (such as angiocentric  lymphoma), and granulomatous/autoimmune/other inflammatory conditions. Electronically Signed   By: ALogan BoresM.D.   On: 05/26/2021 20:30   CT ABDOMEN PELVIS W CONTRAST  Result Date: 05/25/2021 CLINICAL DATA:  Abdominal pain, fever EXAM: CT ABDOMEN AND PELVIS WITH CONTRAST TECHNIQUE: Multidetector CT imaging of the abdomen and pelvis was performed using the standard protocol following bolus administration of intravenous contrast. CONTRAST:  1049mOMNIPAQUE IOHEXOL 300 MG/ML  SOLN COMPARISON:  05/24/2021 FINDINGS: Lower chest: No acute abnormality. Hepatobiliary: Diffuse low-density throughout the liver compatible with  fatty infiltration. No focal abnormality. Gallbladder unremarkable. Pancreas: No focal abnormality or ductal dilatation. Spleen: No focal abnormality.  Normal size. Adrenals/Urinary Tract: No adrenal abnormality. No focal renal abnormality. No stones or hydronephrosis. Urinary bladder is unremarkable. Stomach/Bowel: Normal appendix. Stomach, large and small bowel grossly unremarkable. Vascular/Lymphatic: No evidence of aneurysm or adenopathy. Reproductive: Prior hysterectomy.  No adnexal masses. Other: No free fluid or free air. Musculoskeletal: No acute bony abnormality. IMPRESSION: Hepatic steatosis. No acute findings in the abdomen or pelvis. Electronically Signed   By: Rolm Baptise M.D.   On: 05/25/2021 19:07   ECHOCARDIOGRAM COMPLETE  Result Date: 05/26/2021    ECHOCARDIOGRAM REPORT   Patient Name:   Michaela Rios [facility] Date of Exam: 05/26/2021 Medical Rec #:  [id number]         Height:       63.0 in Accession #:    [id number]        Weight:       134.0 lb Date of Birth:  11-Aug-1951         BSA:          1.631 m Patient Age:    53 years          BP:           140/97 mmHg Patient Gender: F                 HR:           94 bpm. Exam Location:  Inpatient Procedure: 2D Echo, Cardiac Doppler and Color Doppler Indications:    Stroke  History:        Patient has no prior history of Echocardiogram  examinations,                 most recent 08/18/2020. Risk Factors:Hypertension. CKD. Sepsis.  Sonographer:    Clayton Lefort RDCS (AE) Referring Phys: [id number] Efe Fazzino A Avanthika Dehnert IMPRESSIONS  1. Left ventricular ejection fraction, by estimation, is 60 to 65%. The left ventricle has normal function. The left ventricle has no regional wall motion abnormalities. There is moderate left ventricular hypertrophy. Left ventricular diastolic parameters are consistent with Grade I diastolic dysfunction (impaired relaxation).  2. Right ventricular systolic function is normal. The right ventricular size is normal. Tricuspid regurgitation signal is inadequate for assessing PA pressure.  3. The mitral valve is normal in structure. Mild mitral valve regurgitation. No evidence of mitral stenosis.  4. The aortic valve is grossly normal. There is mild calcification of the aortic valve. Aortic valve regurgitation is not visualized. Mild aortic valve sclerosis is present, with no evidence of aortic valve stenosis.  5. The inferior vena cava is normal in size with greater than 50% respiratory variability, suggesting right atrial pressure of 3 mmHg. Conclusion(s)/Recommendation(s): No intracardiac source of embolism detected on this transthoracic study. A transesophageal echocardiogram is recommended to exclude cardiac source of embolism if clinically indicated. FINDINGS  Left Ventricle: Left ventricular ejection fraction, by estimation, is 60 to 65%. The left ventricle has normal function. The left ventricle has no regional wall motion abnormalities. The left ventricular internal cavity size was normal in size. There is  moderate left ventricular hypertrophy. Left ventricular diastolic parameters are consistent with Grade I diastolic dysfunction (impaired relaxation). Right Ventricle: The right ventricular size is normal. No increase in right ventricular wall thickness. Right ventricular systolic function is normal. Tricuspid regurgitation  signal is inadequate for assessing PA pressure. Left Atrium: Left atrial size was normal in size. Right Atrium:  Right atrial size was normal in size. Pericardium: There is no evidence of pericardial effusion. Mitral Valve: The mitral valve is normal in structure. Mild mitral valve regurgitation. No evidence of mitral valve stenosis. MV peak gradient, 2.9 mmHg. The mean mitral valve gradient is 1.0 mmHg. Tricuspid Valve: The tricuspid valve is normal in structure. Tricuspid valve regurgitation is not demonstrated. No evidence of tricuspid stenosis. Aortic Valve: The aortic valve is grossly normal. There is mild calcification of the aortic valve. Aortic valve regurgitation is not visualized. Mild aortic valve sclerosis is present, with no evidence of aortic valve stenosis. Aortic valve mean gradient  measures 3.0 mmHg. Aortic valve peak gradient measures 5.8 mmHg. Aortic valve area, by VTI measures 2.97 cm. Pulmonic Valve: The pulmonic valve was normal in structure. Pulmonic valve regurgitation is trivial. No evidence of pulmonic stenosis. Aorta: The aortic root is normal in size and structure. Venous: The inferior vena cava is normal in size with greater than 50% respiratory variability, suggesting right atrial pressure of 3 mmHg. IAS/Shunts: No atrial level shunt detected by color flow Doppler.  LEFT VENTRICLE PLAX 2D LVIDd:         3.40 cm  Diastology LVIDs:         2.20 cm  LV e' medial:    8.70 cm/s LV PW:         1.30 cm  LV E/e' medial:  7.3 LV IVS:        1.40 cm  LV e' lateral:   13.20 cm/s LVOT diam:     2.00 cm  LV E/e' lateral: 4.8 LV SV:         65 LV SV Index:   40 LVOT Area:     3.14 cm  RIGHT VENTRICLE             IVC RV Basal diam:  2.60 cm     IVC diam: 1.30 cm RV S prime:     18.50 cm/s TAPSE (M-mode): 2.8 cm LEFT ATRIUM           Index       RIGHT ATRIUM           Index LA diam:      2.50 cm 1.53 cm/m  RA Area:     12.50 cm LA Vol (A2C): 38.7 ml 23.73 ml/m RA Volume:   25.60 ml  15.70 ml/m LA  Vol (A4C): 24.4 ml 14.96 ml/m  AORTIC VALVE AV Area (Vmax):    2.96 cm AV Area (Vmean):   2.80 cm AV Area (VTI):     2.97 cm AV Vmax:           120.00 cm/s AV Vmean:          84.100 cm/s AV VTI:            0.219 m AV Peak Grad:      5.8 mmHg AV Mean Grad:      3.0 mmHg LVOT Vmax:         113.00 cm/s LVOT Vmean:        74.900 cm/s LVOT VTI:          0.207 m LVOT/AV VTI ratio: 0.95  AORTA Ao Root diam: 3.30 cm Ao Asc diam:  3.40 cm MITRAL VALVE MV Area (PHT): 3.68 cm    SHUNTS MV Area VTI:   3.39 cm    Systemic VTI:  0.21 m MV Peak grad:  2.9 mmHg    Systemic Diam: 2.00 cm MV  Mean grad:  1.0 mmHg MV Vmax:       0.85 m/s MV Vmean:      54.7 cm/s MV Decel Time: 206 msec MV E velocity: 63.40 cm/s MV A velocity: 76.00 cm/s MV E/A ratio:  0.83 Cherlynn Kaiser MD Electronically signed by Cherlynn Kaiser MD Signature Date/Time: 05/26/2021/7:31:57 PM    Final    VAS [place] LOWER EXTREMITY VENOUS (DVT)  Result Date: 05/25/2021  Lower Venous DVT Study Patient Name:  Michaela Rios [facility]  Date of Exam:   05/25/2021 Medical Rec #: [id number]          Accession #:    [id number] Date of Birth: February 16, 1951          Patient Gender: F Patient Age:   69Y Exam Location:  [facility] Procedure:      VAS [place] LOWER EXTREMITY VENOUS (DVT) Referring Phys: [id number] Joziah Dollins A Carlo Lorson --------------------------------------------------------------------------------  Indications: Edema.  Comparison Study: no prior Performing Technologist: Archie Patten RVS  Examination Guidelines: A complete evaluation includes B-mode imaging, spectral Doppler, color Doppler, and power Doppler as needed of all accessible portions of each vessel. Bilateral testing is considered an integral part of a complete examination. Limited examinations for reoccurring indications may be performed as noted. The reflux portion of the exam is performed with the patient in reverse Trendelenburg.  +---------+---------------+---------+-----------+----------+--------------+  RIGHT    CompressibilityPhasicitySpontaneityPropertiesThrombus Aging +---------+---------------+---------+-----------+----------+--------------+ CFV      Full           Yes      Yes                                 +---------+---------------+---------+-----------+----------+--------------+ SFJ      Full                                                        +---------+---------------+---------+-----------+----------+--------------+ FV Prox  Full                                                        +---------+---------------+---------+-----------+----------+--------------+ FV Mid   Full                                                        +---------+---------------+---------+-----------+----------+--------------+ FV DistalFull                                                        +---------+---------------+---------+-----------+----------+--------------+ PFV      Full                                                        +---------+---------------+---------+-----------+----------+--------------+  POP      Full           Yes      Yes                                 +---------+---------------+---------+-----------+----------+--------------+ PTV      Full                                                        +---------+---------------+---------+-----------+----------+--------------+ PERO     Full                                                        +---------+---------------+---------+-----------+----------+--------------+   +---------+---------------+---------+-----------+----------+--------------+ LEFT     CompressibilityPhasicitySpontaneityPropertiesThrombus Aging +---------+---------------+---------+-----------+----------+--------------+ CFV      Full           Yes      Yes                                 +---------+---------------+---------+-----------+----------+--------------+ SFJ      Full                                                         +---------+---------------+---------+-----------+----------+--------------+ FV Prox  Full                                                        +---------+---------------+---------+-----------+----------+--------------+ FV Mid   Full                                                        +---------+---------------+---------+-----------+----------+--------------+ FV DistalFull                                                        +---------+---------------+---------+-----------+----------+--------------+ PFV      Full                                                        +---------+---------------+---------+-----------+----------+--------------+ POP      Full           Yes      Yes                                 +---------+---------------+---------+-----------+----------+--------------+  PTV      Full                                                        +---------+---------------+---------+-----------+----------+--------------+ PERO     Full                                                        +---------+---------------+---------+-----------+----------+--------------+     Summary: BILATERAL: - No evidence of deep vein thrombosis seen in the lower extremities, bilaterally. -No evidence of popliteal cyst, bilaterally.   *See table(s) above for measurements and observations. Electronically signed by Deitra Mayo MD on 05/25/2021 at 2:52:23 PM.    Final         Scheduled Meds:  amLODipine  5 mg Oral q AM   aspirin  300 mg Rectal Daily   Or   aspirin  325 mg Oral Daily   atorvastatin  80 mg Oral Daily   buPROPion  300 mg Oral Daily   feeding supplement  237 mL Oral BID BM   hydrALAZINE  25 mg Oral Q8H   metoprolol succinate  100 mg Oral Daily   multivitamin with minerals  1 tablet Oral Daily   topiramate  150 mg Oral QHS   venlafaxine XR  75 mg Oral Q breakfast   vitamin B-12  1,000 mcg Oral Daily   Continuous  Infusions:  aztreonam Stopped (05/27/21 0424)   lactated ringers     lactated ringers 100 mL/hr at 05/27/21 0500     LOS: 3 days    Time spent: 35 minutes.     Elmarie Shiley, MD [facility]   If 7PM-7AM, please contact night-coverage [email or web address]  05/27/2021, 11:12 AM

## 2021-05-27 NOTE — Progress Notes (Signed)
STROKE TEAM PROGRESS NOTE   SUBJECTIVE (INTERVAL HISTORY) Her husband and Dr. Tyrell Antonio are at the bedside.  Overall her condition is gradually improving. Pt sitting in chair, mild left side leaning. Awake alert, orientated x 3, answered year "1022" instead of "2022". Still has mild left sided weakness. Discussed about potential LP this afternoon. Currently no fever, no leukocytosis.    OBJECTIVE Temp:  [97.9 F (36.6 C)-99.3 F (37.4 C)] 97.9 F (36.6 C) (06/30 1202) Pulse Rate:  [85-107] 107 (06/30 1202) Cardiac Rhythm: Normal sinus rhythm (06/30 0703) Resp:  [15-18] 18 (06/30 1202) BP: (122-167)/(84-105) 122/84 (06/30 1202) SpO2:  [95 %-97 %] 97 % (06/30 1202)  No results for input(s): GLUCAP in the last 168 hours. Recent Labs  Lab 05/24/21 1005 05/24/21 1940 05/25/21 0155 05/26/21 0236 05/27/21 0106  NA 139  --  137 132* 130*  K 2.8*  --  3.9 3.6 3.4*  CL 105  --  106 103 102  CO2 22  --  24 18* 18*  GLUCOSE 121*  --  101* 94 97  BUN 9  --  5* 8 12  CREATININE 0.77  --  0.71 0.70 0.74  CALCIUM 9.3  --  8.7* 8.9 8.4*  MG  --  2.3  --   --   --    Recent Labs  Lab 05/24/21 1005 05/26/21 0236  AST 24 25  ALT 16 13  ALKPHOS 75 54  BILITOT 0.5 0.6  PROT 7.7 6.4*  ALBUMIN 3.4* 2.9*   Recent Labs  Lab 05/24/21 1005 05/25/21 0155 05/26/21 0236 05/27/21 0106  WBC 8.6 8.1 6.1 7.2  HGB 11.6* 10.5* 11.1* 10.7*  HCT 35.8* 32.9* 34.3* 32.2*  MCV 92.0 93.2 91.0 88.7  PLT 243 199 221 249   No results for input(s): CKTOTAL, CKMB, CKMBINDEX, TROPONINI in the last 168 hours. No results for input(s): LABPROT, INR in the last 72 hours. No results for input(s): COLORURINE, LABSPEC, [place], GLUCOSEU, HGBUR, BILIRUBINUR, KETONESUR, PROTEINUR, UROBILINOGEN, NITRITE, LEUKOCYTESUR in the last 72 hours.  Invalid input(s): APPERANCEUR     Component Value Date/Time   CHOL 96 05/27/2021 0106   TRIG 136 05/27/2021 0106   HDL 24 (L) 05/27/2021 0106   CHOLHDL 4.0 05/27/2021 0106    VLDL 27 05/27/2021 0106   LDLCALC 45 05/27/2021 0106   Lab Results  Component Value Date   HGBA1C 5.8 04/07/2021   No results found for: LABOPIA, COCAINSCRNUR, LABBENZ, AMPHETMU, THCU, LABBARB  No results for input(s): ETH in the last 168 hours.  I have personally reviewed the radiological images below and agree with the radiology interpretations.  CT ANGIO HEAD W OR WO CONTRAST  Result Date: 05/26/2021 CLINICAL DATA:  Stroke suspected. EXAM: CT ANGIOGRAPHY HEAD AND NECK TECHNIQUE: Multidetector CT imaging of the head and neck was performed using the standard protocol during bolus administration of intravenous contrast. Multiplanar CT image reconstructions and MIPs were obtained to evaluate the vascular anatomy. Carotid stenosis measurements (when applicable) are obtained utilizing NASCET criteria, using the distal internal carotid diameter as the denominator. CONTRAST:  37mL OMNIPAQUE IOHEXOL 350 MG/ML SOLN COMPARISON:  None. FINDINGS: CTA NECK FINDINGS Aortic arch: Standard 3 vessel aortic arch with mild atherosclerotic plaque. Widely patent arch vessel origins. Right carotid system: Patent without evidence of stenosis, dissection, or significant atherosclerosis. Left carotid system: Patent with minimal calcified plaque at the carotid bifurcation. No evidence of dissection or stenosis. Tortuous mid cervical ICA. Vertebral arteries: Patent with the right being slightly larger  than the left. No evidence of dissection or stenosis. Skeleton: Advanced disc degeneration at C5-6. Advanced facet arthrosis at C4-5 with grade 1 anterolisthesis. Other neck: No evidence of cervical lymphadenopathy or mass. Upper chest: No apical lung consolidation or mass. Review of the MIP images confirms the above findings CTA HEAD FINDINGS Anterior circulation: The internal carotid arteries are patent from skull base to carotid termini with minimal nonstenotic plaque. ACAs and MCAs are patent without evidence of a proximal  branch occlusion or significant proximal stenosis. No aneurysm is identified. Posterior circulation: The intracranial vertebral arteries are widely patent to the basilar. Patent bilateral PICA, left AICA, and bilateral SCA origins are visualized. A right AICA is not clearly identified. The basilar artery is widely patent. There are small posterior communicating arteries bilaterally. Both PCAs are patent without evidence of a significant proximal stenosis. No aneurysm is identified. Venous sinuses: As permitted by contrast timing, patent. Anatomic variants: None. Review of the MIP images confirms the above findings IMPRESSION: 1. Minimal atherosclerosis in the head and neck without large vessel occlusion or significant stenosis. 2. Aortic Atherosclerosis (ICD10-I70.0). Electronically Signed   By: Logan Bores M.D.   On: 05/26/2021 20:00   DG Chest 2 View  Result Date: 05/24/2021 CLINICAL DATA:  Fever and urinary frequency EXAM: CHEST - 2 VIEW COMPARISON:  08/17/2020 FINDINGS: The heart size and mediastinal contours are within normal limits. Both lungs are clear. The visualized skeletal structures are unremarkable. IMPRESSION: No active cardiopulmonary disease. Electronically Signed   By: Franchot Gallo M.D.   On: 05/24/2021 14:41   CT HEAD WO CONTRAST  Result Date: 05/26/2021 CLINICAL DATA:  Headache. EXAM: CT HEAD WITHOUT CONTRAST TECHNIQUE: Contiguous axial images were obtained from the base of the skull through the vertex without intravenous contrast. COMPARISON:  08/17/2020 FINDINGS: Brain: There is new multifocal hypoattenuation in the basal ganglia and thalami bilaterally. A band of low-density involving the right caudate and lentiform nuclei has an appearance suggestive of an acute lateral lenticulostriate territory infarct. No acute cortically based infarct, intracranial hemorrhage, midline shift, or extra-axial fluid collection is identified. Hypodensities in the periventricular white matter  bilaterally are nonspecific but compatible with mild chronic small vessel ischemic disease. Vascular: No hyperdense vessel. Skull: No fracture or suspicious osseous lesion. Sinuses/Orbits: Visualized paranasal sinuses and mastoid air cells are clear. Unremarkable orbits. Other: None. IMPRESSION: New multifocal hypoattenuation in the basal ganglia and thalami bilaterally of indeterminate etiology. Considerations include multifocal infarcts (including venous infarcts), toxic/metabolic insult, hypoxic-ischemic injury, and infection. No mass effect to suggest tumor. Correlate with pending MRI. Electronically Signed   By: Logan Bores M.D.   On: 05/26/2021 15:20   CT ANGIO NECK W OR WO CONTRAST  Result Date: 05/26/2021 CLINICAL DATA:  Stroke suspected. EXAM: CT ANGIOGRAPHY HEAD AND NECK TECHNIQUE: Multidetector CT imaging of the head and neck was performed using the standard protocol during bolus administration of intravenous contrast. Multiplanar CT image reconstructions and MIPs were obtained to evaluate the vascular anatomy. Carotid stenosis measurements (when applicable) are obtained utilizing NASCET criteria, using the distal internal carotid diameter as the denominator. CONTRAST:  63mL OMNIPAQUE IOHEXOL 350 MG/ML SOLN COMPARISON:  None. FINDINGS: CTA NECK FINDINGS Aortic arch: Standard 3 vessel aortic arch with mild atherosclerotic plaque. Widely patent arch vessel origins. Right carotid system: Patent without evidence of stenosis, dissection, or significant atherosclerosis. Left carotid system: Patent with minimal calcified plaque at the carotid bifurcation. No evidence of dissection or stenosis. Tortuous mid cervical ICA. Vertebral arteries: Patent  with the right being slightly larger than the left. No evidence of dissection or stenosis. Skeleton: Advanced disc degeneration at C5-6. Advanced facet arthrosis at C4-5 with grade 1 anterolisthesis. Other neck: No evidence of cervical lymphadenopathy or mass.  Upper chest: No apical lung consolidation or mass. Review of the MIP images confirms the above findings CTA HEAD FINDINGS Anterior circulation: The internal carotid arteries are patent from skull base to carotid termini with minimal nonstenotic plaque. ACAs and MCAs are patent without evidence of a proximal branch occlusion or significant proximal stenosis. No aneurysm is identified. Posterior circulation: The intracranial vertebral arteries are widely patent to the basilar. Patent bilateral PICA, left AICA, and bilateral SCA origins are visualized. A right AICA is not clearly identified. The basilar artery is widely patent. There are small posterior communicating arteries bilaterally. Both PCAs are patent without evidence of a significant proximal stenosis. No aneurysm is identified. Venous sinuses: As permitted by contrast timing, patent. Anatomic variants: None. Review of the MIP images confirms the above findings IMPRESSION: 1. Minimal atherosclerosis in the head and neck without large vessel occlusion or significant stenosis. 2. Aortic Atherosclerosis (ICD10-I70.0). Electronically Signed   By: Logan Bores M.D.   On: 05/26/2021 20:00   CT Angio Chest PE W/Cm &/Or Wo Cm  Result Date: 05/24/2021 CLINICAL DATA:  Elevated D-dimer, tachycardia EXAM: CT ANGIOGRAPHY CHEST WITH CONTRAST TECHNIQUE: Multidetector CT imaging of the chest was performed using the standard protocol during bolus administration of intravenous contrast. Multiplanar CT image reconstructions and MIPs were obtained to evaluate the vascular anatomy. CONTRAST:  45mL OMNIPAQUE IOHEXOL 350 MG/ML SOLN COMPARISON:  None. FINDINGS: Cardiovascular: Normal heart size. No pericardial effusion. Thoracic aorta is normal in caliber minor calcified plaque. Satisfactory opacification of the pulmonary arteries to the proximal segmental level. No evidence of acute pulmonary embolism. Mediastinum/Nodes: No enlarged mediastinal or hilar lymph nodes. Mildly  prominent but nonenlarged axillary and subpectoral lymph nodes. Lungs/Pleura: No pleural effusion or pneumothorax. There is a 4 mm peripheral nodular opacity of the right middle lobe (series 7, image 58). Upper Abdomen: No acute abnormality. Postoperative changes at the gastroesophageal junction. Musculoskeletal: Degenerative changes of the spine. No acute osseous abnormality. Review of the MIP images confirms the above findings. IMPRESSION: No evidence of acute pulmonary embolism or other acute abnormality. 4 mm right middle lobe peripheral nodular opacity. No follow-up needed if patient is low-risk. Non-contrast chest CT can be considered in 12 months if patient is high-risk. This recommendation follows the consensus statement: Guidelines for Management of Incidental Pulmonary Nodules Detected on CT Images: From the Fleischner Society 2017; Radiology 2017; 284:228-243. Electronically Signed   By: Macy Mis M.D.   On: 05/24/2021 16:41   MR BRAIN WO CONTRAST  Result Date: 05/26/2021 CLINICAL DATA:  Acute neuro deficit.  Abnormal CT.  Fever EXAM: MRI HEAD WITHOUT CONTRAST TECHNIQUE: Multiplanar, multiecho pulse sequences of the brain and surrounding structures were obtained without intravenous contrast. COMPARISON:  CT head 02/29/2022, 08/17/2020 FINDINGS: Brain: Focal area of restricted diffusion in the right lenticulostriate distribution compatible with acute infarct. This area also shows increased signal on T2 and FLAIR. Mild edema in the deep white matter tracts on the right. Small amount of FLAIR hyperintensity right medial thalamus. Additional areas of increased signal on T2 and FLAIR involving the head of caudate on the left and left thalamus without restricted diffusion. There is mild enlargement of the head of the caudate on the left. There is also asymmetric white matter hyperintensity in the  deep white matter tracks of the left external capsule and left internal capsule. These areas do not show  restricted diffusion. Ventricle size normal.  No midline shift.  Negative for hemorrhage Vascular: Normal arterial flow voids. Skull and upper cervical spine: Negative Sinuses/Orbits: Minimal mucosal edema paranasal sinuses. Negative orbit Other: None IMPRESSION: Restricted diffusion in the right basal ganglia corresponding to lenticulostriate territory. This is most consistent with acute infarction Hyperintensity and mild enlargement of the left head of caudate. Hyperintensity also in the left thalamus and a small amount of hyperintensity in the right medial thalamus. There is vasogenic type edema in the deep white matter structures on the left and to lesser extent in the deep white matter structures on the right. No restricted diffusion on the left. This is an unusual pattern. I would recommend postcontrast imaging of the brain to rule out tumor on the left. Subacute ischemia on the left appears unlikely given the vasogenic edema and unusual appearance of the head of the caudate and thalamus on the left. Also consider infection/cerebritis. Opportunistic infection such as toxoplasmosis possible. These results were called by telephone at the time of interpretation on 05/26/2021 at 4:10 pm to provider [facility], who verbally acknowledged these results. Electronically Signed   By: Franchot Gallo M.D.   On: 05/26/2021 16:10   MR BRAIN W CONTRAST  Result Date: 05/26/2021 CLINICAL DATA:  Follow-up abnormal brain MRI earlier today. EXAM: MRI HEAD WITH CONTRAST TECHNIQUE: Multiplanar, multiecho pulse sequences of the brain and surrounding structures were obtained with intravenous contrast. CONTRAST:  6.42mL GADAVIST GADOBUTROL 1 MMOL/ML IV SOLN COMPARISON:  Noncontrast brain MRI earlier today FINDINGS: There is very mild enhancement within the previously seen regions of signal abnormality in the basal ganglia and thalami. Much of this enhancement appears vascular and perivascular. No solid enhancing mass is present. There  is focal abnormal leptomeningeal enhancement over the right parietal convexity (series 6, image 6), and there may also be minimal leptomeningeal enhancement over the left parietal convexity (series 6, image 19). IMPRESSION: Mild, largely vascular/perivascular appearing enhancement in the areas of signal abnormality in the deep gray nuclei as well as a small amount of leptomeningeal enhancement over the right and possibly left parietal convexities. No solid enhancing mass. Differential diagnosis includes infection (including cryptococcus), vasculitis, unusual neoplasm (such as angiocentric lymphoma), and granulomatous/autoimmune/other inflammatory conditions. Electronically Signed   By: Logan Bores M.D.   On: 05/26/2021 20:30   CT ABDOMEN PELVIS W CONTRAST  Result Date: 05/25/2021 CLINICAL DATA:  Abdominal pain, fever EXAM: CT ABDOMEN AND PELVIS WITH CONTRAST TECHNIQUE: Multidetector CT imaging of the abdomen and pelvis was performed using the standard protocol following bolus administration of intravenous contrast. CONTRAST:  141mL OMNIPAQUE IOHEXOL 300 MG/ML  SOLN COMPARISON:  05/24/2021 FINDINGS: Lower chest: No acute abnormality. Hepatobiliary: Diffuse low-density throughout the liver compatible with fatty infiltration. No focal abnormality. Gallbladder unremarkable. Pancreas: No focal abnormality or ductal dilatation. Spleen: No focal abnormality.  Normal size. Adrenals/Urinary Tract: No adrenal abnormality. No focal renal abnormality. No stones or hydronephrosis. Urinary bladder is unremarkable. Stomach/Bowel: Normal appendix. Stomach, large and small bowel grossly unremarkable. Vascular/Lymphatic: No evidence of aneurysm or adenopathy. Reproductive: Prior hysterectomy.  No adnexal masses. Other: No free fluid or free air. Musculoskeletal: No acute bony abnormality. IMPRESSION: Hepatic steatosis. No acute findings in the abdomen or pelvis. Electronically Signed   By: Rolm Baptise M.D.   On: 05/25/2021  19:07   ECHOCARDIOGRAM COMPLETE  Result Date: 05/26/2021    ECHOCARDIOGRAM REPORT  Patient Name:   Michaela R[facility] Date of Exam: 05/26/2021 Medical Rec #:  [id number]         Height:       63.0 in Accession #:    [id number]        Weight:       134.0 lb Date of Birth:  July 15, 1951         BSA:          1.631 m Patient Age:    16 years          BP:           140/97 mmHg Patient Gender: F                 HR:           94 bpm. Exam Location:  Inpatient Procedure: 2D Echo, Cardiac Doppler and Color Doppler Indications:    Stroke  History:        Patient has no prior history of Echocardiogram examinations,                 most recent 08/18/2020. Risk Factors:Hypertension. CKD. Sepsis.  Sonographer:    Clayton Lefort RDCS (AE) Referring Phys: [id number] BELKYS A REGALADO IMPRESSIONS  1. Left ventricular ejection fraction, by estimation, is 60 to 65%. The left ventricle has normal function. The left ventricle has no regional wall motion abnormalities. There is moderate left ventricular hypertrophy. Left ventricular diastolic parameters are consistent with Grade I diastolic dysfunction (impaired relaxation).  2. Right ventricular systolic function is normal. The right ventricular size is normal. Tricuspid regurgitation signal is inadequate for assessing PA pressure.  3. The mitral valve is normal in structure. Mild mitral valve regurgitation. No evidence of mitral stenosis.  4. The aortic valve is grossly normal. There is mild calcification of the aortic valve. Aortic valve regurgitation is not visualized. Mild aortic valve sclerosis is present, with no evidence of aortic valve stenosis.  5. The inferior vena cava is normal in size with greater than 50% respiratory variability, suggesting right atrial pressure of 3 mmHg. Conclusion(s)/Recommendation(s): No intracardiac source of embolism detected on this transthoracic study. A transesophageal echocardiogram is recommended to exclude cardiac source of embolism if clinically  indicated. FINDINGS  Left Ventricle: Left ventricular ejection fraction, by estimation, is 60 to 65%. The left ventricle has normal function. The left ventricle has no regional wall motion abnormalities. The left ventricular internal cavity size was normal in size. There is  moderate left ventricular hypertrophy. Left ventricular diastolic parameters are consistent with Grade I diastolic dysfunction (impaired relaxation). Right Ventricle: The right ventricular size is normal. No increase in right ventricular wall thickness. Right ventricular systolic function is normal. Tricuspid regurgitation signal is inadequate for assessing PA pressure. Left Atrium: Left atrial size was normal in size. Right Atrium: Right atrial size was normal in size. Pericardium: There is no evidence of pericardial effusion. Mitral Valve: The mitral valve is normal in structure. Mild mitral valve regurgitation. No evidence of mitral valve stenosis. MV peak gradient, 2.9 mmHg. The mean mitral valve gradient is 1.0 mmHg. Tricuspid Valve: The tricuspid valve is normal in structure. Tricuspid valve regurgitation is not demonstrated. No evidence of tricuspid stenosis. Aortic Valve: The aortic valve is grossly normal. There is mild calcification of the aortic valve. Aortic valve regurgitation is not visualized. Mild aortic valve sclerosis is present, with no evidence of aortic valve stenosis. Aortic valve mean gradient  measures 3.0 mmHg. Aortic valve peak gradient measures  5.8 mmHg. Aortic valve area, by VTI measures 2.97 cm. Pulmonic Valve: The pulmonic valve was normal in structure. Pulmonic valve regurgitation is trivial. No evidence of pulmonic stenosis. Aorta: The aortic root is normal in size and structure. Venous: The inferior vena cava is normal in size with greater than 50% respiratory variability, suggesting right atrial pressure of 3 mmHg. IAS/Shunts: No atrial level shunt detected by color flow Doppler.  LEFT VENTRICLE PLAX 2D LVIDd:          3.40 cm  Diastology LVIDs:         2.20 cm  LV e' medial:    8.70 cm/s LV PW:         1.30 cm  LV E/e' medial:  7.3 LV IVS:        1.40 cm  LV e' lateral:   13.20 cm/s LVOT diam:     2.00 cm  LV E/e' lateral: 4.8 LV SV:         65 LV SV Index:   40 LVOT Area:     3.14 cm  RIGHT VENTRICLE             IVC RV Basal diam:  2.60 cm     IVC diam: 1.30 cm RV S prime:     18.50 cm/s TAPSE (M-mode): 2.8 cm LEFT ATRIUM           Index       RIGHT ATRIUM           Index LA diam:      2.50 cm 1.53 cm/m  RA Area:     12.50 cm LA Vol (A2C): 38.7 ml 23.73 ml/m RA Volume:   25.60 ml  15.70 ml/m LA Vol (A4C): 24.4 ml 14.96 ml/m  AORTIC VALVE AV Area (Vmax):    2.96 cm AV Area (Vmean):   2.80 cm AV Area (VTI):     2.97 cm AV Vmax:           120.00 cm/s AV Vmean:          84.100 cm/s AV VTI:            0.219 m AV Peak Grad:      5.8 mmHg AV Mean Grad:      3.0 mmHg LVOT Vmax:         113.00 cm/s LVOT Vmean:        74.900 cm/s LVOT VTI:          0.207 m LVOT/AV VTI ratio: 0.95  AORTA Ao Root diam: 3.30 cm Ao Asc diam:  3.40 cm MITRAL VALVE MV Area (PHT): 3.68 cm    SHUNTS MV Area VTI:   3.39 cm    Systemic VTI:  0.21 m MV Peak grad:  2.9 mmHg    Systemic Diam: 2.00 cm MV Mean grad:  1.0 mmHg MV Vmax:       0.85 m/s MV Vmean:      54.7 cm/s MV Decel Time: 206 msec MV E velocity: 63.40 cm/s MV A velocity: 76.00 cm/s MV E/A ratio:  0.83 Cherlynn Kaiser MD Electronically signed by Cherlynn Kaiser MD Signature Date/Time: 05/26/2021/7:31:57 PM    Final    CT Renal Stone Study  Result Date: 05/24/2021 CLINICAL DATA:  Flank pain, urinary frequency EXAM: CT ABDOMEN AND PELVIS WITHOUT CONTRAST TECHNIQUE: Multidetector CT imaging of the abdomen and pelvis was performed following the standard protocol without IV contrast. COMPARISON:  None available FINDINGS: Lower chest: No acute abnormality. Hepatobiliary:  Limited without IV contrast. Punctate hepatic dome calcified granuloma anteriorly. No large focal hepatic abnormality  intrahepatic biliary dilatation. Gallbladder unremarkable. Common bile duct nondilated. Pancreas: Unremarkable. No pancreatic ductal dilatation or surrounding inflammatory changes. Spleen: Normal in size without focal abnormality. Adrenals/Urinary Tract: Adrenal glands are unremarkable. Kidneys are normal, without renal calculi, focal lesion, or hydronephrosis. Bladder is unremarkable. Stomach/Bowel: Negative for bowel obstruction, significant dilatation, ileus, or free air. Normal appendix demonstrated. No free fluid, fluid collection, hemorrhage, hematoma abscess, ascites. Vascular/Lymphatic: Minor aortic atherosclerosis and tortuosity. Negative for aneurysm. Limited assessment without IV contrast. Mild bilateral external iliac adenopathy, nonspecific. Left external iliac has a short axis diameter 1.3 cm. No inguinal adenopathy. No significant retroperitoneal adenopathy. Reproductive: Remote hysterectomy. Calcifications noted in the pelvis appearing benign or postoperative. No adnexal abnormality. No pelvic free fluid. Other: Small fat containing umbilical hernia. No large ventral hernia. No inguinal hernia. Negative for ascites. Musculoskeletal: Thoracolumbar degenerative changes. Lower lumbar facet arthropathy. No acute osseous finding. IMPRESSION: No acute obstructing urinary tract or ureteral calculus. No obstructive uropathy or hydronephrosis. No other acute intra-abdominal or pelvic finding by noncontrast CT. Nonspecific bilateral mild external iliac adenopathy, short axis diameters measure up to 1.3 cm on the left. Aortic Atherosclerosis (ICD10-I70.0). Electronically Signed   By: Jerilynn Mages.  Shick M.D.   On: 05/24/2021 11:47   VAS [place] LOWER EXTREMITY VENOUS (DVT)  Result Date: 05/25/2021  Lower Venous DVT Study Patient Name:  HEILEY SHAIKH [facility]  Date of Exam:   05/25/2021 Medical Rec #: [id number]          Accession #:    [id number] Date of Birth: 09/29/1951          Patient Gender: F Patient Age:   69Y Exam  Location:  [facility] Procedure:      VAS [place] LOWER EXTREMITY VENOUS (DVT) Referring Phys: [id number] BELKYS A REGALADO --------------------------------------------------------------------------------  Indications: Edema.  Comparison Study: no prior Performing Technologist: Archie Patten RVS  Examination Guidelines: A complete evaluation includes B-mode imaging, spectral Doppler, color Doppler, and power Doppler as needed of all accessible portions of each vessel. Bilateral testing is considered an integral part of a complete examination. Limited examinations for reoccurring indications may be performed as noted. The reflux portion of the exam is performed with the patient in reverse Trendelenburg.  +---------+---------------+---------+-----------+----------+--------------+ RIGHT    CompressibilityPhasicitySpontaneityPropertiesThrombus Aging +---------+---------------+---------+-----------+----------+--------------+ CFV      Full           Yes      Yes                                 +---------+---------------+---------+-----------+----------+--------------+ SFJ      Full                                                        +---------+---------------+---------+-----------+----------+--------------+ FV Prox  Full                                                        +---------+---------------+---------+-----------+----------+--------------+ FV Mid   Full                                                        +---------+---------------+---------+-----------+----------+--------------+  FV DistalFull                                                        +---------+---------------+---------+-----------+----------+--------------+ PFV      Full                                                        +---------+---------------+---------+-----------+----------+--------------+ POP      Full           Yes      Yes                                  +---------+---------------+---------+-----------+----------+--------------+ PTV      Full                                                        +---------+---------------+---------+-----------+----------+--------------+ PERO     Full                                                        +---------+---------------+---------+-----------+----------+--------------+   +---------+---------------+---------+-----------+----------+--------------+ LEFT     CompressibilityPhasicitySpontaneityPropertiesThrombus Aging +---------+---------------+---------+-----------+----------+--------------+ CFV      Full           Yes      Yes                                 +---------+---------------+---------+-----------+----------+--------------+ SFJ      Full                                                        +---------+---------------+---------+-----------+----------+--------------+ FV Prox  Full                                                        +---------+---------------+---------+-----------+----------+--------------+ FV Mid   Full                                                        +---------+---------------+---------+-----------+----------+--------------+ FV DistalFull                                                        +---------+---------------+---------+-----------+----------+--------------+  PFV      Full                                                        +---------+---------------+---------+-----------+----------+--------------+ POP      Full           Yes      Yes                                 +---------+---------------+---------+-----------+----------+--------------+ PTV      Full                                                        +---------+---------------+---------+-----------+----------+--------------+ PERO     Full                                                         +---------+---------------+---------+-----------+----------+--------------+     Summary: BILATERAL: - No evidence of deep vein thrombosis seen in the lower extremities, bilaterally. -No evidence of popliteal cyst, bilaterally.   *See table(s) above for measurements and observations. Electronically signed by Deitra Mayo MD on 05/25/2021 at 2:52:23 PM.    Final      PHYSICAL EXAM  Temp:  [97.9 F (36.6 C)-99.3 F (37.4 C)] 97.9 F (36.6 C) (06/30 1202) Pulse Rate:  [85-107] 107 (06/30 1202) Resp:  [15-18] 18 (06/30 1202) BP: (122-167)/(84-105) 122/84 (06/30 1202) SpO2:  [95 %-97 %] 97 % (06/30 1202)  General - Well nourished, well developed, in no apparent distress.  Ophthalmologic - fundi not visualized due to noncooperation.  Cardiovascular - Regular rhythm and rate.  Mental Status -  Level of arousal and orientation to month, place, and person were intact, stated year wrong. Language including expression, naming, repetition, comprehension was assessed and found intact. Attention span and concentration were unremarkable, able to backspell WORLD although slow.  Cranial Nerves II - XII - II - Visual field intact OU. III, IV, VI - Extraocular movements intact. V - Facial sensation intact bilaterally. VII - Facial movement intact bilaterally. VIII - Hearing & vestibular intact bilaterally. X - Palate elevates symmetrically. XI - Chin turning & shoulder shrug intact bilaterally. XII - Tongue protrusion intact.  Motor Strength - The patient's strength was normal in right upper and lower extremities, however, LUE deltoid, bicep and tricep 4+/5, finger grip 5/5. LLE  proximal 5/5, and distal ankle DF/PF 4/5. Bulk was normal and fasciculations were absent.   Motor Tone - Muscle tone was assessed at the neck and appendages and was normal.  Reflexes - The patient's reflexes were symmetrical in all extremities and she had no pathological reflexes.  Sensory - Light touch,  temperature/pinprick were assessed and were symmetrical.    Coordination - The patient had normal movements in the hands with no ataxia or dysmetria.  Tremor was absent.  Gait and Station - deferred.   ASSESSMENT/PLAN Michaela Rios is a 70 y.o. female  with history of anxiety, depression, migraine, HTN, PVD admitted for falling at home, generalized weakness, fever, difficult waking with imbalance, lack of appetite. Seems started last weekend. No tPA given due to outside window.    Stroke:  right BG/CR infarct likely secondary to small vessel disease source CT head right BG/CR infarct MRI  confirmed right BG/CR infarct CTA head and neck - no significant finding 2D Echo  EF 60-65% LDL 45 HgbA1c pending lovenox for VTE prophylaxis, currently on hold for LP preparation No antithrombotic prior to admission, now on aspirin 325 mg daily. DAPT on hold for LP preparation.  Patient counseled to be compliant with her antithrombotic medications Ongoing aggressive stroke risk factor management Therapy recommendations:  CIR Disposition:  pending  Left brain abnormal signal on MRI MRI brain showed Hyperintensity and mild enlargement of the left head of caudate. Hyperintensity also in the left thalamus and a small amount of hyperintensity in the right medial thalamus. MRI with contrast Mild, largely vascular/perivascular appearing enhancement in the areas of signal abnormality in the deep gray nuclei as well as a small amount of leptomeningeal enhancement over the right and possibly left parietal convexities. No solid enhancing mass Asymptomatic for focal deficit at this time Etiology unclear, will check EEG, and CSF analysis Questionable fever episode - currently no fever, no leukocytosis On Azactam Autoimmune work up pending, ESR and CRP pending  Hypertension Stable Gradually normalized BP within 3-5 days. On novasc, hydralazine, and metoprolol Long term BP goal   Lipid  management Home meds:  none  LDL 45, goal < 70 No statin now given LDL at goal  Other Stroke Risk Factors Advanced age Migraines on topamax and sumatriptan  Other Active Problems Depression on effexor  Hospital day # 3  I discussed with Dr. Tyrell Antonio. I spent  35 minutes in total face-to-face time with the patient, more than 50% of which was spent in counseling and coordination of care, reviewing test results, images and medication, and discussing the diagnosis, treatment plan and potential prognosis. This patient's care requiresreview of multiple databases, neurological assessment, discussion with family, other specialists and medical decision making of high complexity. I had long discussion with husband and pt at bedside, updated pt current condition, treatment plan and potential prognosis, and answered all the questions. They expressed understanding and appreciation.     Rosalin Hawking, MD PhD Stroke Neurology 05/27/2021 12:07 PM    To contact Stroke Continuity provider, please refer to [email or web address] After hours, contact General Neurology

## 2021-05-27 NOTE — Procedures (Signed)
Procedure Note: Lumbar puncture  Indications: assess for CNS pathology   Operator: Rosalin Hawking, MD, PhD  Others present: RN at beside helping  Indications, risks, and benefits explained to patient / surrogate decision maker and informed consent obtained. Time-out was performed, with all individuals present agreeing on the procedure to be performed, the site of procedure, and the patient identity.  Patient positioned, prepped and draped in usual sterile fashion. L3-4 space located using bilateral iliac crests as landmarks. 1% Lidocaine without epinephrine was used to anesthetize the area. An 20G spinal needle was introduced into the sub- arachnoid space. Stylet was removed with appropriate fluid return. Needle removed after adequate fluid collected. Blood loss was minimal. A dry guaze dressing was placed over insertion site. Patient tolerated the procedure well and no complications were observed. Spontaneous movement of bilateral extremities were observed after the procedure.  Opening pressure: 18 cm H20 (hips & legs flexed)   Total fluid removed: 12cc  Color of fluid: clear  Sent for: cell count + diff , gram stain, cultures, glucose, protein, crypto antigen, HSV1/2 PCR, VDRL, oligoclonal band   Rosalin Hawking, MD PhD Stroke Neurology 05/27/2021 7:21 PM

## 2021-05-27 NOTE — Progress Notes (Addendum)
Physical Therapy Re-Evaluation & Treatment Patient Details Name: Michaela Rios MRN: [id number] DOB: 07/11/1951 Today's Date: 05/27/2021    History of Present Illness Pt is a 70 y.o. female admitted 05/24/21 with c/o weakness, fall due to loss of balance, poor PO intake. Workup for sepsis, unclear etiology. Pt with confusion, neuro consult for possible CNS infection. Brain MRI 6/29 showed R basal ganglia stroke in addition to accelerated small vessel disease. PMH includes HTN, CKD 3, migraines, chronic LBP, prediabetes, anxiety, depression.   PT Comments    Pt with significant change in functional mobility status compared to initial PT Evaluation on 05/25/21. Noted MRI results of R basal ganglia stroke in addition to small vessel disease. Today, it's difficult to determine true extent of cognitive/neuromuscular impairment vs. significant fatigue vs. some baseline personality traits (husband reports pt is very quiet and "low-key" at baseline). Pt was ambulating in hallway with up to [place] during our session 05/25/21, now pt requiring mod-maxA to stand and maintain balance due to strong posterior lean. Pt demonstrates apparent motor control deficits, as well as slowed processing, decreased attention and difficulty problem solving; pt with generalized weakness, but no significant focal weakness noted (potential for L DF weak?); pt with new coordination deficits (?). Based on this change in status, feel pt would benefit from intensive CIR-level therapies to maximize functional mobility and independence prior to return home; pt's husband in agreement, available for 24/7 support. Will continue to follow to address established goals.  Seated BP 130/82 Post-transfer BP 118/89 HR 107-130    Follow Up Recommendations  CIR;Supervision/Assistance - 24 hour     Equipment Recommendations   (TBD)    Recommendations for Other Services       Precautions / Restrictions Precautions Precautions: Fall;Other  (comment) Precaution Comments: urinary urgency/incontinence (has Depends in room) Restrictions Weight Bearing Restrictions: No    Mobility  Bed Mobility Overal bed mobility: Needs Assistance Bed Mobility: Supine to Sit     Supine to sit: Min assist;HOB elevated     General bed mobility comments: Significant time (>5-min) to complete tasks of coming to bed, requiring verbal cues to attend to and complete tasks; pt distracted by lines, then pausing and staring blankly around room; when asked, pt able to state, "I'm supposed to sit up" but ultimately requiring minA for UE support to scoot hips to EOB    Transfers Overall transfer level: Needs assistance Equipment used: 1 person hand held assist;Rolling walker (2 wheeled) Transfers: Sit to/from [facility] Sit to Stand: Max assist         General transfer comment: Pt unable to stand without HHA, requiring mod-maxA to elevate trunk and maintain stability, pt with strong posterior lean bracing BLEs against EOB, multiple LOB with attempts to correct this; additional trial with RW, cues for sequencing, pt still requiring mod-maxA for HHA to elevate trunk; RW hindering stand due to posterior lean; additional stand pivot to recliner with bilateral HHA and modA, pt able to take shuffling steps  Ambulation/Gait             General Gait Details: Shuffling, pivotal steps from bed to recliner with bilateral HHA and modA for trunk stability; will require +2 assist for gait progression   Stairs             Wheelchair Mobility    Modified Rankin (Stroke Patients Only) Modified Rankin (Stroke Patients Only) Pre-Morbid Rankin Score: No significant disability Modified Rankin: Moderately severe disability     Balance  Overall balance assessment: Needs assistance Sitting-balance support: Bilateral upper extremity supported;Feet supported Sitting balance-Leahy Scale: Poor Sitting balance - Comments: Pt reliant on UE  support and fluctuating min guard to modA to maintain static sitting balance; upon return to sit after standing trial, pt with posterior/L lateral lean with difficulty correcting despite verbal/tactile cues Postural control: Posterior lean;Left lateral lean Standing balance support: Single extremity supported;Bilateral upper extremity supported;During functional activity Standing balance-Leahy Scale: Poor Standing balance comment: Reliant on UE support and external assist, difficulty correct heavy posterior lean                            Cognition Arousal/Alertness: Lethargic Behavior During Therapy: Flat affect Overall Cognitive Status: Impaired/Different from baseline Area of Impairment: Attention;Following commands;Safety/judgement;Awareness;Problem solving                   Current Attention Level: Sustained   Following Commands: Follows one step commands inconsistently;Follows one step commands with increased time Safety/Judgement: Decreased awareness of safety;Decreased awareness of deficits Awareness: Intellectual;Emergent Problem Solving: Slow processing;Decreased initiation;Difficulty sequencing;Requires verbal cues;Requires tactile cues General Comments: Dificult to determine true cognitive impairment vs significant fatigue vs personality (husband reports pt flat/"low-key" as baseline, does not talk much). Pt with apparent slowed process and difficulty motor planning; requires significant increased time to perform some simple motor tasks. Minimal responses to questions; pt attributes impairments to fatigue      Exercises      General Comments General comments (skin integrity, edema, etc.): Sitting BP 130/82, post-transfer BP 118/89, HR 107-130, SpO2 100% on RA.Pt's husband present and very supportive; discussed d/c recommendation update for CIR-level therapies, husband reports understanding      Pertinent Vitals/Pain Pain Assessment: No/denies pain Pain  Intervention(s): Monitored during session    Home Living                      Prior Function            PT Goals (current goals can now be found in the care plan section) Acute Rehab PT Goals Patient Stated Goal: Agreeble to CIR-level therapies to maximize recovery PT Goal Formulation: With family Progress towards PT goals: Not progressing toward goals - comment (change in functional status)    Frequency    Min 4X/week      PT Plan Discharge plan needs to be updated;Frequency needs to be updated    Co-evaluation              AM-PAC PT "6 Clicks" Mobility   Outcome Measure  Help needed turning from your back to your side while in a flat bed without using bedrails?: A Little Help needed moving from lying on your back to sitting on the side of a flat bed without using bedrails?: A Lot Help needed moving to and from a bed to a chair (including a wheelchair)?: A Lot Help needed standing up from a chair using your arms (e.g., wheelchair or bedside chair)?: A Lot Help needed to walk in hospital room?: A Lot Help needed climbing 3-5 steps with a railing? : A Lot 6 Click Score: 13    End of Session Equipment Utilized During Treatment: Gait belt Activity Tolerance: Patient tolerated treatment well;Patient limited by fatigue Patient left: in chair;with call bell/phone within reach;with chair alarm set;with family/visitor present Nurse Communication: Mobility status PT Visit Diagnosis: Other abnormalities of gait and mobility (R26.89);Muscle weakness (generalized) (M62.81);Other symptoms and signs involving  the nervous system (R29.898)     Time: 9937-1696 PT Time Calculation (min) (ACUTE ONLY): 47 min  Charges:  $Re-Evaluation $Therapeutic Activity: 8-22 mins $Self Care/Home Management: 8-22                     Mabeline Caras, PT, DPT Acute Rehabilitation Services  Pager [phone] Office [place] 05/27/2021, 11:11 AM

## 2021-05-28 DIAGNOSIS — R9389 Abnormal findings on diagnostic imaging of other specified body structures: Secondary | ICD-10-CM

## 2021-05-28 LAB — CBC
HCT: 30.6 % — ABNORMAL LOW (ref 36.0–46.0)
Hemoglobin: 10.3 g/dL — ABNORMAL LOW (ref 12.0–15.0)
MCH: 29.9 pg (ref 26.0–34.0)
MCHC: 33.7 g/dL (ref 30.0–36.0)
MCV: 89 fL (ref 80.0–100.0)
Platelets: 231 10*3/uL (ref 150–400)
RBC: 3.44 MIL/uL — ABNORMAL LOW (ref 3.87–5.11)
RDW: 15 % (ref 11.5–15.5)
WBC: 6.6 10*3/uL (ref 4.0–10.5)
nRBC: 0 % (ref 0.0–0.2)

## 2021-05-28 LAB — BASIC METABOLIC PANEL
Anion gap: 7 (ref 5–15)
BUN: 9 mg/dL (ref 8–23)
CO2: 20 mmol/L — ABNORMAL LOW (ref 22–32)
Calcium: 8.2 mg/dL — ABNORMAL LOW (ref 8.9–10.3)
Chloride: 107 mmol/L (ref 98–111)
Creatinine, Ser: 0.64 mg/dL (ref 0.44–1.00)
GFR, Estimated: >60 mL/min (ref >60)
Glucose, Bld: 107 mg/dL — ABNORMAL HIGH (ref 70–99)
Potassium: 3.3 mmol/L — ABNORMAL LOW (ref 3.5–5.1)
Sodium: 134 mmol/L — ABNORMAL LOW (ref 135–145)

## 2021-05-28 LAB — RHEUMATOID FACTOR: Rheumatoid fact SerPl-aCnc: <10 IU/mL (ref <14.0)

## 2021-05-28 MED ORDER — POTASSIUM CHLORIDE CRYS ER 20 MEQ PO TBCR
40.0000 meq | EXTENDED_RELEASE_TABLET | Freq: Two times a day (BID) | ORAL | Status: AC
  Administered 2021-05-28 (×2): 40 meq via ORAL
  Filled 2021-05-28 (×2): qty 2

## 2021-05-28 MED ORDER — CLOPIDOGREL BISULFATE 75 MG PO TABS
75.0000 mg | ORAL_TABLET | Freq: Every day | ORAL | Status: DC
  Administered 2021-05-28 – 2021-06-03 (×7): 75 mg via ORAL
  Filled 2021-05-28 (×7): qty 1

## 2021-05-28 MED ORDER — ASPIRIN EC 81 MG PO TBEC
81.0000 mg | DELAYED_RELEASE_TABLET | Freq: Every day | ORAL | Status: DC
  Administered 2021-05-29 – 2021-06-08 (×11): 81 mg via ORAL
  Filled 2021-05-28 (×11): qty 1

## 2021-05-28 MED ORDER — ENOXAPARIN SODIUM 40 MG/0.4ML IJ SOSY
40.0000 mg | PREFILLED_SYRINGE | INTRAMUSCULAR | Status: DC
  Administered 2021-05-28 – 2021-05-31 (×4): 40 mg via SUBCUTANEOUS
  Filled 2021-05-28 (×4): qty 0.4

## 2021-05-28 MED ORDER — HYDRALAZINE HCL 25 MG PO TABS
25.0000 mg | ORAL_TABLET | Freq: Two times a day (BID) | ORAL | Status: DC
  Administered 2021-05-28 – 2021-06-08 (×22): 25 mg via ORAL
  Filled 2021-05-28 (×22): qty 1

## 2021-05-28 MED ORDER — POTASSIUM CHLORIDE CRYS ER 20 MEQ PO TBCR
40.0000 meq | EXTENDED_RELEASE_TABLET | Freq: Once | ORAL | Status: AC
  Administered 2021-05-28: 40 meq via ORAL
  Filled 2021-05-28: qty 2

## 2021-05-28 NOTE — Progress Notes (Addendum)
STROKE TEAM PROGRESS NOTE   SUBJECTIVE (INTERVAL HISTORY) Her husband is at the bedside.  Patient lying in bed, neuro stable, still has mild left-sided weakness, PT/OT recommend CIR.  Had a lumbar puncture yesterday and CSF showed no CNS infection, however, protein level 82, will check CSF cytology and paraneoplastic panel.  Recommend repeat MRI in 1 months.   OBJECTIVE Temp:  [98.5 F (36.9 C)-99 F (37.2 C)] 98.5 F (36.9 C) (07/01 1242) Pulse Rate:  [100-126] 100 (07/01 1242) Cardiac Rhythm: Sinus tachycardia (07/01 0757) Resp:  [18-19] 18 (07/01 1242) BP: (126-154)/(72-95) 126/78 (07/01 1242) SpO2:  [96 %-98 %] 97 % (07/01 0503)  No results for input(s): GLUCAP in the last 168 hours. Recent Labs  Lab 05/24/21 1005 05/24/21 1940 05/25/21 0155 05/26/21 0236 05/27/21 0106 05/28/21 0211  NA 139  --  137 132* 130* 134*  K 2.8*  --  3.9 3.6 3.4* 3.3*  CL 105  --  106 103 102 107  CO2 22  --  24 18* 18* 20*  GLUCOSE 121*  --  101* 94 97 107*  BUN 9  --  5* _0 CREATININE 0.77  --  0.71 0.70 0.74 0.64  CALCIUM 9.3  --  8.7* 8.9 8.4* 8.2*  MG  --  2.3  --   --   --   --    Recent Labs  Lab 05/24/21 1005 05/26/21 0236  AST 24 25  ALT 16 13  ALKPHOS 75 54  BILITOT 0.5 0.6  PROT 7.7 6.4*  ALBUMIN 3.4* 2.9*   Recent Labs  Lab 05/24/21 1005 05/25/21 0155 05/26/21 0236 05/27/21 0106 05/28/21 0211  WBC 8.6 8.1 6.1 7.2 6.6  HGB 11.6* 10.5* 11.1* 10.7* 10.3*  HCT 35.8* 32.9* 34.3* 32.2* 30.6*  MCV 92.0 93.2 91.0 88.7 89.0  PLT 243 199 221 249 231   No results for input(s): CKTOTAL, CKMB, CKMBINDEX, TROPONINI in the last 168 hours. No results for input(s): LABPROT, INR in the last 72 hours. No results for input(s): COLORURINE, LABSPEC, [place], GLUCOSEU, HGBUR, BILIRUBINUR, KETONESUR, PROTEINUR, UROBILINOGEN, NITRITE, LEUKOCYTESUR in the last 72 hours.  Invalid input(s): APPERANCEUR     Component Value Date/Time   CHOL 96 05/27/2021 0106   TRIG 136 05/27/2021  0106   HDL 24 (L) 05/27/2021 0106   CHOLHDL 4.0 05/27/2021 0106   VLDL 27 05/27/2021 0106   LDLCALC 45 05/27/2021 0106   Lab Results  Component Value Date   HGBA1C 5.9 (H) 05/27/2021   No results found for: LABOPIA, COCAINSCRNUR, LABBENZ, AMPHETMU, THCU, LABBARB  No results for input(s): ETH in the last 168 hours.  I have personally reviewed the radiological images below and agree with the radiology interpretations.  CT ANGIO HEAD W OR WO CONTRAST  Result Date: 05/26/2021 CLINICAL DATA:  Stroke suspected. EXAM: CT ANGIOGRAPHY HEAD AND NECK TECHNIQUE: Multidetector CT imaging of the head and neck was performed using the standard protocol during bolus administration of intravenous contrast. Multiplanar CT image reconstructions and MIPs were obtained to evaluate the vascular anatomy. Carotid stenosis measurements (when applicable) are obtained utilizing NASCET criteria, using the distal internal carotid diameter as the denominator. CONTRAST:  76m OMNIPAQUE IOHEXOL 350 MG/ML SOLN COMPARISON:  None. FINDINGS: CTA NECK FINDINGS Aortic arch: Standard 3 vessel aortic arch with mild atherosclerotic plaque. Widely patent arch vessel origins. Right carotid system: Patent without evidence of stenosis, dissection, or significant atherosclerosis. Left carotid system: Patent with minimal calcified plaque at the carotid bifurcation. No  evidence of dissection or stenosis. Tortuous mid cervical ICA. Vertebral arteries: Patent with the right being slightly larger than the left. No evidence of dissection or stenosis. Skeleton: Advanced disc degeneration at C5-6. Advanced facet arthrosis at C4-5 with grade 1 anterolisthesis. Other neck: No evidence of cervical lymphadenopathy or mass. Upper chest: No apical lung consolidation or mass. Review of the MIP images confirms the above findings CTA HEAD FINDINGS Anterior circulation: The internal carotid arteries are patent from skull base to carotid termini with minimal  nonstenotic plaque. ACAs and MCAs are patent without evidence of a proximal branch occlusion or significant proximal stenosis. No aneurysm is identified. Posterior circulation: The intracranial vertebral arteries are widely patent to the basilar. Patent bilateral PICA, left AICA, and bilateral SCA origins are visualized. A right AICA is not clearly identified. The basilar artery is widely patent. There are small posterior communicating arteries bilaterally. Both PCAs are patent without evidence of a significant proximal stenosis. No aneurysm is identified. Venous sinuses: As permitted by contrast timing, patent. Anatomic variants: None. Review of the MIP images confirms the above findings IMPRESSION: 1. Minimal atherosclerosis in the head and neck without large vessel occlusion or significant stenosis. 2. Aortic Atherosclerosis (ICD10-I70.0). Electronically Signed   By: Logan Bores M.D.   On: 05/26/2021 20:00   DG Chest 2 View  Result Date: 05/24/2021 CLINICAL DATA:  Fever and urinary frequency EXAM: CHEST - 2 VIEW COMPARISON:  08/17/2020 FINDINGS: The heart size and mediastinal contours are within normal limits. Both lungs are clear. The visualized skeletal structures are unremarkable. IMPRESSION: No active cardiopulmonary disease. Electronically Signed   By: Franchot Gallo M.D.   On: 05/24/2021 14:41   CT HEAD WO CONTRAST  Result Date: 05/26/2021 CLINICAL DATA:  Headache. EXAM: CT HEAD WITHOUT CONTRAST TECHNIQUE: Contiguous axial images were obtained from the base of the skull through the vertex without intravenous contrast. COMPARISON:  08/17/2020 FINDINGS: Brain: There is new multifocal hypoattenuation in the basal ganglia and thalami bilaterally. A band of low-density involving the right caudate and lentiform nuclei has an appearance suggestive of an acute lateral lenticulostriate territory infarct. No acute cortically based infarct, intracranial hemorrhage, midline shift, or extra-axial fluid  collection is identified. Hypodensities in the periventricular white matter bilaterally are nonspecific but compatible with mild chronic small vessel ischemic disease. Vascular: No hyperdense vessel. Skull: No fracture or suspicious osseous lesion. Sinuses/Orbits: Visualized paranasal sinuses and mastoid air cells are clear. Unremarkable orbits. Other: None. IMPRESSION: New multifocal hypoattenuation in the basal ganglia and thalami bilaterally of indeterminate etiology. Considerations include multifocal infarcts (including venous infarcts), toxic/metabolic insult, hypoxic-ischemic injury, and infection. No mass effect to suggest tumor. Correlate with pending MRI. Electronically Signed   By: Logan Bores M.D.   On: 05/26/2021 15:20   CT ANGIO NECK W OR WO CONTRAST  Result Date: 05/26/2021 CLINICAL DATA:  Stroke suspected. EXAM: CT ANGIOGRAPHY HEAD AND NECK TECHNIQUE: Multidetector CT imaging of the head and neck was performed using the standard protocol during bolus administration of intravenous contrast. Multiplanar CT image reconstructions and MIPs were obtained to evaluate the vascular anatomy. Carotid stenosis measurements (when applicable) are obtained utilizing NASCET criteria, using the distal internal carotid diameter as the denominator. CONTRAST:  20m OMNIPAQUE IOHEXOL 350 MG/ML SOLN COMPARISON:  None. FINDINGS: CTA NECK FINDINGS Aortic arch: Standard 3 vessel aortic arch with mild atherosclerotic plaque. Widely patent arch vessel origins. Right carotid system: Patent without evidence of stenosis, dissection, or significant atherosclerosis. Left carotid system: Patent with minimal calcified  plaque at the carotid bifurcation. No evidence of dissection or stenosis. Tortuous mid cervical ICA. Vertebral arteries: Patent with the right being slightly larger than the left. No evidence of dissection or stenosis. Skeleton: Advanced disc degeneration at C5-6. Advanced facet arthrosis at C4-5 with grade 1  anterolisthesis. Other neck: No evidence of cervical lymphadenopathy or mass. Upper chest: No apical lung consolidation or mass. Review of the MIP images confirms the above findings CTA HEAD FINDINGS Anterior circulation: The internal carotid arteries are patent from skull base to carotid termini with minimal nonstenotic plaque. ACAs and MCAs are patent without evidence of a proximal branch occlusion or significant proximal stenosis. No aneurysm is identified. Posterior circulation: The intracranial vertebral arteries are widely patent to the basilar. Patent bilateral PICA, left AICA, and bilateral SCA origins are visualized. A right AICA is not clearly identified. The basilar artery is widely patent. There are small posterior communicating arteries bilaterally. Both PCAs are patent without evidence of a significant proximal stenosis. No aneurysm is identified. Venous sinuses: As permitted by contrast timing, patent. Anatomic variants: None. Review of the MIP images confirms the above findings IMPRESSION: 1. Minimal atherosclerosis in the head and neck without large vessel occlusion or significant stenosis. 2. Aortic Atherosclerosis (ICD10-I70.0). Electronically Signed   By: Logan Bores M.D.   On: 05/26/2021 20:00   CT Angio Chest PE W/Cm &/Or Wo Cm  Result Date: 05/24/2021 CLINICAL DATA:  Elevated D-dimer, tachycardia EXAM: CT ANGIOGRAPHY CHEST WITH CONTRAST TECHNIQUE: Multidetector CT imaging of the chest was performed using the standard protocol during bolus administration of intravenous contrast. Multiplanar CT image reconstructions and MIPs were obtained to evaluate the vascular anatomy. CONTRAST:  44m OMNIPAQUE IOHEXOL 350 MG/ML SOLN COMPARISON:  None. FINDINGS: Cardiovascular: Normal heart size. No pericardial effusion. Thoracic aorta is normal in caliber minor calcified plaque. Satisfactory opacification of the pulmonary arteries to the proximal segmental level. No evidence of acute pulmonary embolism.  Mediastinum/Nodes: No enlarged mediastinal or hilar lymph nodes. Mildly prominent but nonenlarged axillary and subpectoral lymph nodes. Lungs/Pleura: No pleural effusion or pneumothorax. There is a 4 mm peripheral nodular opacity of the right middle lobe (series 7, image 58). Upper Abdomen: No acute abnormality. Postoperative changes at the gastroesophageal junction. Musculoskeletal: Degenerative changes of the spine. No acute osseous abnormality. Review of the MIP images confirms the above findings. IMPRESSION: No evidence of acute pulmonary embolism or other acute abnormality. 4 mm right middle lobe peripheral nodular opacity. No follow-up needed if patient is low-risk. Non-contrast chest CT can be considered in 12 months if patient is high-risk. This recommendation follows the consensus statement: Guidelines for Management of Incidental Pulmonary Nodules Detected on CT Images: From the Fleischner Society 2017; Radiology 2017; 284:228-243. Electronically Signed   By: PMacy MisM.D.   On: 05/24/2021 16:41   MR BRAIN WO CONTRAST  Result Date: 05/26/2021 CLINICAL DATA:  Acute neuro deficit.  Abnormal CT.  Fever EXAM: MRI HEAD WITHOUT CONTRAST TECHNIQUE: Multiplanar, multiecho pulse sequences of the brain and surrounding structures were obtained without intravenous contrast. COMPARISON:  CT head 02/29/2022, 08/17/2020 FINDINGS: Brain: Focal area of restricted diffusion in the right lenticulostriate distribution compatible with acute infarct. This area also shows increased signal on T2 and FLAIR. Mild edema in the deep white matter tracts on the right. Small amount of FLAIR hyperintensity right medial thalamus. Additional areas of increased signal on T2 and FLAIR involving the head of caudate on the left and left thalamus without restricted diffusion. There is mild enlargement  of the head of the caudate on the left. There is also asymmetric white matter hyperintensity in the deep white matter tracks of the  left external capsule and left internal capsule. These areas do not show restricted diffusion. Ventricle size normal.  No midline shift.  Negative for hemorrhage Vascular: Normal arterial flow voids. Skull and upper cervical spine: Negative Sinuses/Orbits: Minimal mucosal edema paranasal sinuses. Negative orbit Other: None IMPRESSION: Restricted diffusion in the right basal ganglia corresponding to lenticulostriate territory. This is most consistent with acute infarction Hyperintensity and mild enlargement of the left head of caudate. Hyperintensity also in the left thalamus and a small amount of hyperintensity in the right medial thalamus. There is vasogenic type edema in the deep white matter structures on the left and to lesser extent in the deep white matter structures on the right. No restricted diffusion on the left. This is an unusual pattern. I would recommend postcontrast imaging of the brain to rule out tumor on the left. Subacute ischemia on the left appears unlikely given the vasogenic edema and unusual appearance of the head of the caudate and thalamus on the left. Also consider infection/cerebritis. Opportunistic infection such as toxoplasmosis possible. These results were called by telephone at the time of interpretation on 05/26/2021 at 4:10 pm to provider [facility], who verbally acknowledged these results. Electronically Signed   By: Franchot Gallo M.D.   On: 05/26/2021 16:10   MR BRAIN W CONTRAST  Result Date: 05/26/2021 CLINICAL DATA:  Follow-up abnormal brain MRI earlier today. EXAM: MRI HEAD WITH CONTRAST TECHNIQUE: Multiplanar, multiecho pulse sequences of the brain and surrounding structures were obtained with intravenous contrast. CONTRAST:  6.31m GADAVIST GADOBUTROL 1 MMOL/ML IV SOLN COMPARISON:  Noncontrast brain MRI earlier today FINDINGS: There is very mild enhancement within the previously seen regions of signal abnormality in the basal ganglia and thalami. Much of this enhancement  appears vascular and perivascular. No solid enhancing mass is present. There is focal abnormal leptomeningeal enhancement over the right parietal convexity (series 6, image 6), and there may also be minimal leptomeningeal enhancement over the left parietal convexity (series 6, image 19). IMPRESSION: Mild, largely vascular/perivascular appearing enhancement in the areas of signal abnormality in the deep gray nuclei as well as a small amount of leptomeningeal enhancement over the right and possibly left parietal convexities. No solid enhancing mass. Differential diagnosis includes infection (including cryptococcus), vasculitis, unusual neoplasm (such as angiocentric lymphoma), and granulomatous/autoimmune/other inflammatory conditions. Electronically Signed   By: ALogan BoresM.D.   On: 05/26/2021 20:30   CT ABDOMEN PELVIS W CONTRAST  Result Date: 05/25/2021 CLINICAL DATA:  Abdominal pain, fever EXAM: CT ABDOMEN AND PELVIS WITH CONTRAST TECHNIQUE: Multidetector CT imaging of the abdomen and pelvis was performed using the standard protocol following bolus administration of intravenous contrast. CONTRAST:  1049mOMNIPAQUE IOHEXOL 300 MG/ML  SOLN COMPARISON:  05/24/2021 FINDINGS: Lower chest: No acute abnormality. Hepatobiliary: Diffuse low-density throughout the liver compatible with fatty infiltration. No focal abnormality. Gallbladder unremarkable. Pancreas: No focal abnormality or ductal dilatation. Spleen: No focal abnormality.  Normal size. Adrenals/Urinary Tract: No adrenal abnormality. No focal renal abnormality. No stones or hydronephrosis. Urinary bladder is unremarkable. Stomach/Bowel: Normal appendix. Stomach, large and small bowel grossly unremarkable. Vascular/Lymphatic: No evidence of aneurysm or adenopathy. Reproductive: Prior hysterectomy.  No adnexal masses. Other: No free fluid or free air. Musculoskeletal: No acute bony abnormality. IMPRESSION: Hepatic steatosis. No acute findings in the abdomen  or pelvis. Electronically Signed   By: KeRolm Baptise.D.  On: 05/25/2021 19:07   EEG adult  Result Date: 05/27/2021 Lora Havens, MD     05/27/2021  6:42 PM Patient Name: LILJA SOLAND MRN: [id number] Epilepsy Attending: Lora Havens Referring Physician/Provider: Dr Rosalin Hawking Date: 05/27/2021 Duration: 24.11 mins Patient history: 70yo F presented with falling at home, generalized weakness, fever, difficult waking with imbalance, lack of appetite. MRI showed hyperintensity and mild enlargement of the left head of caudate. EEG to evaluate for seizure Level of alertness: Awake AEDs during EEG study: TPM Technical aspects: This EEG study was done with scalp electrodes positioned according to the 10-20 International system of electrode placement. Electrical activity was acquired at a sampling rate of 500Hz and reviewed with a high frequency filter of 70Hz and a low frequency filter of 1Hz. EEG data were recorded continuously and digitally stored. Description: The posterior dominant rhythm consists of 9 Hz activity of moderate voltage (25-35 uV) seen predominantly in posterior head regions, symmetric and reactive to eye opening and eye closing. EEG showed intermittent 3 to 6 Hz theta-delta slowing in left temporal  region. Single sharp transient was seen in left temporal region. Hyperventilation and photic stimulation were not performed.   ABNORMALITY - Intermittent slow, generalized IMPRESSION: This study is suggestive of cortical dysfunction arising from left temporal region, nonspecific etiology. No seizures or definite epileptiform discharges were seen throughout the recording. If suspicion for interictal activity remains a concern, a prolonged study including sleep should be considered. Lora Havens   ECHOCARDIOGRAM COMPLETE  Result Date: 05/26/2021    ECHOCARDIOGRAM REPORT   Patient Name:   DURINDA BUZZELLI [facility] Date of Exam: 05/26/2021 Medical Rec #:  [id number]         Height:       63.0 in  Accession #:    [id number]        Weight:       134.0 lb Date of Birth:  Jan 12, 1951         BSA:          1.631 m Patient Age:    28 years          BP:           140/97 mmHg Patient Gender: F                 HR:           94 bpm. Exam Location:  Inpatient Procedure: 2D Echo, Cardiac Doppler and Color Doppler Indications:    Stroke  History:        Patient has no prior history of Echocardiogram examinations,                 most recent 08/18/2020. Risk Factors:Hypertension. CKD. Sepsis.  Sonographer:    Clayton Lefort RDCS (AE) Referring Phys: [id number] BELKYS A REGALADO IMPRESSIONS  1. Left ventricular ejection fraction, by estimation, is 60 to 65%. The left ventricle has normal function. The left ventricle has no regional wall motion abnormalities. There is moderate left ventricular hypertrophy. Left ventricular diastolic parameters are consistent with Grade I diastolic dysfunction (impaired relaxation).  2. Right ventricular systolic function is normal. The right ventricular size is normal. Tricuspid regurgitation signal is inadequate for assessing PA pressure.  3. The mitral valve is normal in structure. Mild mitral valve regurgitation. No evidence of mitral stenosis.  4. The aortic valve is grossly normal. There is mild calcification of the aortic valve. Aortic valve regurgitation is not visualized. Mild aortic valve sclerosis is  present, with no evidence of aortic valve stenosis.  5. The inferior vena cava is normal in size with greater than 50% respiratory variability, suggesting right atrial pressure of 3 mmHg. Conclusion(s)/Recommendation(s): No intracardiac source of embolism detected on this transthoracic study. A transesophageal echocardiogram is recommended to exclude cardiac source of embolism if clinically indicated. FINDINGS  Left Ventricle: Left ventricular ejection fraction, by estimation, is 60 to 65%. The left ventricle has normal function. The left ventricle has no regional wall motion abnormalities. The  left ventricular internal cavity size was normal in size. There is  moderate left ventricular hypertrophy. Left ventricular diastolic parameters are consistent with Grade I diastolic dysfunction (impaired relaxation). Right Ventricle: The right ventricular size is normal. No increase in right ventricular wall thickness. Right ventricular systolic function is normal. Tricuspid regurgitation signal is inadequate for assessing PA pressure. Left Atrium: Left atrial size was normal in size. Right Atrium: Right atrial size was normal in size. Pericardium: There is no evidence of pericardial effusion. Mitral Valve: The mitral valve is normal in structure. Mild mitral valve regurgitation. No evidence of mitral valve stenosis. MV peak gradient, 2.9 mmHg. The mean mitral valve gradient is 1.0 mmHg. Tricuspid Valve: The tricuspid valve is normal in structure. Tricuspid valve regurgitation is not demonstrated. No evidence of tricuspid stenosis. Aortic Valve: The aortic valve is grossly normal. There is mild calcification of the aortic valve. Aortic valve regurgitation is not visualized. Mild aortic valve sclerosis is present, with no evidence of aortic valve stenosis. Aortic valve mean gradient  measures 3.0 mmHg. Aortic valve peak gradient measures 5.8 mmHg. Aortic valve area, by VTI measures 2.97 cm. Pulmonic Valve: The pulmonic valve was normal in structure. Pulmonic valve regurgitation is trivial. No evidence of pulmonic stenosis. Aorta: The aortic root is normal in size and structure. Venous: The inferior vena cava is normal in size with greater than 50% respiratory variability, suggesting right atrial pressure of 3 mmHg. IAS/Shunts: No atrial level shunt detected by color flow Doppler.  LEFT VENTRICLE PLAX 2D LVIDd:         3.40 cm  Diastology LVIDs:         2.20 cm  LV e' medial:    8.70 cm/s LV PW:         1.30 cm  LV E/e' medial:  7.3 LV IVS:        1.40 cm  LV e' lateral:   13.20 cm/s LVOT diam:     2.00 cm  LV E/e'  lateral: 4.8 LV SV:         65 LV SV Index:   40 LVOT Area:     3.14 cm  RIGHT VENTRICLE             IVC RV Basal diam:  2.60 cm     IVC diam: 1.30 cm RV S prime:     18.50 cm/s TAPSE (M-mode): 2.8 cm LEFT ATRIUM           Index       RIGHT ATRIUM           Index LA diam:      2.50 cm 1.53 cm/m  RA Area:     12.50 cm LA Vol (A2C): 38.7 ml 23.73 ml/m RA Volume:   25.60 ml  15.70 ml/m LA Vol (A4C): 24.4 ml 14.96 ml/m  AORTIC VALVE AV Area (Vmax):    2.96 cm AV Area (Vmean):   2.80 cm AV Area (VTI):  2.97 cm AV Vmax:           120.00 cm/s AV Vmean:          84.100 cm/s AV VTI:            0.219 m AV Peak Grad:      5.8 mmHg AV Mean Grad:      3.0 mmHg LVOT Vmax:         113.00 cm/s LVOT Vmean:        74.900 cm/s LVOT VTI:          0.207 m LVOT/AV VTI ratio: 0.95  AORTA Ao Root diam: 3.30 cm Ao Asc diam:  3.40 cm MITRAL VALVE MV Area (PHT): 3.68 cm    SHUNTS MV Area VTI:   3.39 cm    Systemic VTI:  0.21 m MV Peak grad:  2.9 mmHg    Systemic Diam: 2.00 cm MV Mean grad:  1.0 mmHg MV Vmax:       0.85 m/s MV Vmean:      54.7 cm/s MV Decel Time: 206 msec MV E velocity: 63.40 cm/s MV A velocity: 76.00 cm/s MV E/A ratio:  0.83 Cherlynn Kaiser MD Electronically signed by Cherlynn Kaiser MD Signature Date/Time: 05/26/2021/7:31:57 PM    Final    CT Renal Stone Study  Result Date: 05/24/2021 CLINICAL DATA:  Flank pain, urinary frequency EXAM: CT ABDOMEN AND PELVIS WITHOUT CONTRAST TECHNIQUE: Multidetector CT imaging of the abdomen and pelvis was performed following the standard protocol without IV contrast. COMPARISON:  None available FINDINGS: Lower chest: No acute abnormality. Hepatobiliary: Limited without IV contrast. Punctate hepatic dome calcified granuloma anteriorly. No large focal hepatic abnormality intrahepatic biliary dilatation. Gallbladder unremarkable. Common bile duct nondilated. Pancreas: Unremarkable. No pancreatic ductal dilatation or surrounding inflammatory changes. Spleen: Normal in size  without focal abnormality. Adrenals/Urinary Tract: Adrenal glands are unremarkable. Kidneys are normal, without renal calculi, focal lesion, or hydronephrosis. Bladder is unremarkable. Stomach/Bowel: Negative for bowel obstruction, significant dilatation, ileus, or free air. Normal appendix demonstrated. No free fluid, fluid collection, hemorrhage, hematoma abscess, ascites. Vascular/Lymphatic: Minor aortic atherosclerosis and tortuosity. Negative for aneurysm. Limited assessment without IV contrast. Mild bilateral external iliac adenopathy, nonspecific. Left external iliac has a short axis diameter 1.3 cm. No inguinal adenopathy. No significant retroperitoneal adenopathy. Reproductive: Remote hysterectomy. Calcifications noted in the pelvis appearing benign or postoperative. No adnexal abnormality. No pelvic free fluid. Other: Small fat containing umbilical hernia. No large ventral hernia. No inguinal hernia. Negative for ascites. Musculoskeletal: Thoracolumbar degenerative changes. Lower lumbar facet arthropathy. No acute osseous finding. IMPRESSION: No acute obstructing urinary tract or ureteral calculus. No obstructive uropathy or hydronephrosis. No other acute intra-abdominal or pelvic finding by noncontrast CT. Nonspecific bilateral mild external iliac adenopathy, short axis diameters measure up to 1.3 cm on the left. Aortic Atherosclerosis (ICD10-I70.0). Electronically Signed   By: Jerilynn Mages.  Shick M.D.   On: 05/24/2021 11:47   VAS [place] LOWER EXTREMITY VENOUS (DVT)  Result Date: 05/25/2021  Lower Venous DVT Study Patient Name:  IMONI KOHEN [facility]  Date of Exam:   05/25/2021 Medical Rec #: [id number]          Accession #:    [id number] Date of Birth: 10-02-1951          Patient Gender: F Patient Age:   69Y Exam Location:  [facility] Procedure:      VAS [place] LOWER EXTREMITY VENOUS (DVT) Referring Phys: [id number] BELKYS A REGALADO --------------------------------------------------------------------------------   Indications: Edema.  Comparison Study:  no prior Performing Technologist: Archie Patten RVS  Examination Guidelines: A complete evaluation includes B-mode imaging, spectral Doppler, color Doppler, and power Doppler as needed of all accessible portions of each vessel. Bilateral testing is considered an integral part of a complete examination. Limited examinations for reoccurring indications may be performed as noted. The reflux portion of the exam is performed with the patient in reverse Trendelenburg.  +---------+---------------+---------+-----------+----------+--------------+ RIGHT    CompressibilityPhasicitySpontaneityPropertiesThrombus Aging +---------+---------------+---------+-----------+----------+--------------+ CFV      Full           Yes      Yes                                 +---------+---------------+---------+-----------+----------+--------------+ SFJ      Full                                                        +---------+---------------+---------+-----------+----------+--------------+ FV Prox  Full                                                        +---------+---------------+---------+-----------+----------+--------------+ FV Mid   Full                                                        +---------+---------------+---------+-----------+----------+--------------+ FV DistalFull                                                        +---------+---------------+---------+-----------+----------+--------------+ PFV      Full                                                        +---------+---------------+---------+-----------+----------+--------------+ POP      Full           Yes      Yes                                 +---------+---------------+---------+-----------+----------+--------------+ PTV      Full                                                         +---------+---------------+---------+-----------+----------+--------------+ PERO     Full                                                        +---------+---------------+---------+-----------+----------+--------------+   +---------+---------------+---------+-----------+----------+--------------+  LEFT     CompressibilityPhasicitySpontaneityPropertiesThrombus Aging +---------+---------------+---------+-----------+----------+--------------+ CFV      Full           Yes      Yes                                 +---------+---------------+---------+-----------+----------+--------------+ SFJ      Full                                                        +---------+---------------+---------+-----------+----------+--------------+ FV Prox  Full                                                        +---------+---------------+---------+-----------+----------+--------------+ FV Mid   Full                                                        +---------+---------------+---------+-----------+----------+--------------+ FV DistalFull                                                        +---------+---------------+---------+-----------+----------+--------------+ PFV      Full                                                        +---------+---------------+---------+-----------+----------+--------------+ POP      Full           Yes      Yes                                 +---------+---------------+---------+-----------+----------+--------------+ PTV      Full                                                        +---------+---------------+---------+-----------+----------+--------------+ PERO     Full                                                        +---------+---------------+---------+-----------+----------+--------------+     Summary: BILATERAL: - No evidence of deep vein thrombosis seen in the lower extremities, bilaterally. -No evidence of  popliteal cyst, bilaterally.   *See table(s) above for measurements and observations. Electronically signed by Deitra Mayo MD on 05/25/2021 at 2:52:23 PM.  Final      PHYSICAL EXAM  Temp:  [98.5 F (36.9 C)-99 F (37.2 C)] 98.5 F (36.9 C) (07/01 1242) Pulse Rate:  [100-126] 100 (07/01 1242) Resp:  [18-19] 18 (07/01 1242) BP: (126-154)/(72-95) 126/78 (07/01 1242) SpO2:  [96 %-98 %] 97 % (07/01 0503)  General - Well nourished, well developed, in no apparent distress.  Ophthalmologic - fundi not visualized due to noncooperation.  Cardiovascular - Regular rhythm and rate.  Mental Status -  Level of arousal and orientation to month, place, and person were intact, stated year wrong. Language including expression, naming, repetition, comprehension was assessed and found intact. Attention span and concentration were unremarkable, able to backspell WORLD although slow.  Cranial Nerves II - XII - II - Visual field intact OU. III, IV, VI - Extraocular movements intact. V - Facial sensation intact bilaterally. VII - Facial movement intact bilaterally. VIII - Hearing & vestibular intact bilaterally. X - Palate elevates symmetrically. XI - Chin turning & shoulder shrug intact bilaterally. XII - Tongue protrusion intact.  Motor Strength - The patient's strength was normal in right upper and lower extremities, however, LUE deltoid, bicep and tricep 4+/5, finger grip 5/5. LLE  proximal 5/5, and distal ankle DF/PF 4/5. Bulk was normal and fasciculations were absent.   Motor Tone - Muscle tone was assessed at the neck and appendages and was normal.  Reflexes - The patient's reflexes were symmetrical in all extremities and she had no pathological reflexes.  Sensory - Light touch, temperature/pinprick were assessed and were symmetrical.    Coordination - The patient had normal movements in the hands with no ataxia or dysmetria.  Tremor was absent.  Gait and Station -  deferred.   ASSESSMENT/PLAN Ms. TASHENA IBACH is a 70 y.o. female with history of anxiety, depression, migraine, HTN, PVD admitted for falling at home, generalized weakness, fever, difficult waking with imbalance, lack of appetite. Seems started last weekend. No tPA given due to outside window.    Stroke:  right BG/CR infarct likely secondary to small vessel disease source CT head right BG/CR infarct MRI  confirmed right BG/CR infarct CTA head and neck - no significant finding 2D Echo  EF 60-65% LDL 45 HgbA1c 5.9 lovenox for VTE prophylaxis No antithrombotic prior to admission, now on aspirin 81 and Plavix 75 DAPT for 3 weeks and then aspirin alone. Patient counseled to be compliant with her antithrombotic medications Ongoing aggressive stroke risk factor management Therapy recommendations:  CIR Disposition:  pending  Left brain abnormal signal on MRI MRI brain showed Hyperintensity and mild enlargement of the left head of caudate. Hyperintensity also in the left thalamus and a small amount of hyperintensity in the right medial thalamus. MRI with contrast Mild, largely vascular/perivascular appearing enhancement in the areas of signal abnormality in the deep gray nuclei as well as a small amount of leptomeningeal enhancement over the right and possibly left parietal convexities. No solid enhancing mass Asymptomatic for focal deficit at this time EEG cortical dysfunction arising from left temporal region, nonspecific etiology, no seizure CSF WBC 2, RBC 22, protein 82 and glucose 52.  Cryptococcal antigen negative, VDRL pending, oligoclonal bands pending, HSV PCR pending, cytology pending, paraneoplastic panel pending Questionable fever episode - currently no fever, no leukocytosis On Azactam Autoimmune work up pending, ESR 37 and CRP 6.8 Recommend repeat MRI in 1 months  Hypertension Stable Gradually normalized BP within 3-5 days. On novasc, hydralazine, and metoprolol Long term  BP goal   Lipid  management Home meds:  none  LDL 45, goal < 70 No statin now given LDL at goal  Other Stroke Risk Factors Advanced age Migraines on topamax and sumatriptan  Other Active Problems Depression on effexor  Hospital day # 4  I discussed with Dr. Tyrell Antonio. I had long discussion with husband at bedside, updated pt current condition, treatment plan and potential prognosis, and answered all the questions.  He expressed understanding and appreciation.   Neurology will sign off. Please call with questions. Pt will follow up with Dr. Leonie Man at [facility] in about 4 weeks. Thanks for the consult.    Rosalin Hawking, MD PhD Stroke Neurology 05/28/2021 4:09 PM    To contact Stroke Continuity provider, please refer to [email or web address] After hours, contact General Neurology

## 2021-05-28 NOTE — Progress Notes (Signed)
Inpatient Rehab Admissions Coordinator:   Consult received. I met with pt and her spouse at the bedside.  I reviewed CIR goals and expectations to include likely 3-4 week length of stay with goals of supervision to min assist.  Pt's spouse states he is able to assist with mobility, bathing, and dressing, but not toileting.  I explained insurance authorization process for them.  Unfortunately [employer] is not open this weekend, so will need to open the case next week.  I can likely send Monday for review Tuesday, and once received [employer] will likely take up to 72 hours to respond.  Note still pending some autoimmune workup.  One of my colleagues will f/u on Monday and I will f/u on Tuesday.   Caitlin Warren, PT, DPT Admissions Coordinator [phone] 05/28/21  2:38 PM  

## 2021-05-28 NOTE — Evaluation (Signed)
Occupational Therapy Re-evaluation Patient Details Name: Michaela Rios MRN: [id number] DOB: December 01, 1950 Today's Date: 05/28/2021    History of Present Illness Pt is a 70 y.o. female admitted 05/24/21 with c/o weakness, fall due to loss of balance, poor PO intake. Workup for sepsis, unclear etiology. Pt with confusion, neuro consult for possible CNS infection. Brain MRI 6/29 showed R basal ganglia stroke in addition to accelerated small vessel disease. PMH includes HTN, CKD 3, migraines, chronic LBP, prediabetes, anxiety, depression.   Clinical Impression   Pt seen for re-evaluation after stroke on 6/29. Pt demonstrating difficulty attending to the L side, as well as decreased coordination in the L hand. Pt requiring mod-max A +2 for all functional mobility and transfers due to posterior lean and L sided lean. In addition, pt presenting with difficulty attending to tasks with other stimuli in the room. Pt's vision, motor planning, and cognition are affected, requiring pt to need increased assist and support. Pt will benefit from intensive rehab to maximize her recovery early on. Acute OT will continue to follow to address concerns listed below.     Follow Up Recommendations  CIR    Equipment Recommendations  Other (comment) (TBD)    Recommendations for Other Services Rehab consult     Precautions / Restrictions Precautions Precautions: Fall;Other (comment) Precaution Comments: urinary urgency/incontinence (has Depends in room) Restrictions Weight Bearing Restrictions: No      Mobility Bed Mobility Overal bed mobility: Needs Assistance Bed Mobility: Supine to Sit     Supine to sit: Mod assist;HOB elevated     General bed mobility comments: Mod A to elevate trunk and square hips, Pt required additional time to process/plan how to come to sitting EOB.    Transfers Overall transfer level: Needs assistance Equipment used: 2 person hand held assist Transfers: Sit to/from  [facility] Sit to Stand: Max assist;+2 physical assistance;+2 safety/equipment Stand pivot transfers: Mod assist;Max assist;+2 physical assistance;+2 safety/equipment       General transfer comment: Pt unable to stand without HHA, requiring mod-maxA to elevate trunk and maintain stability, pt with strong posterior lean bracing BLEs against EOB. Additional stand pivot to recliner with bilateral HHA and modA, pt able to take shuffling steps.    Balance Overall balance assessment: Needs assistance Sitting-balance support: Bilateral upper extremity supported;Feet supported Sitting balance-Leahy Scale: Poor Sitting balance - Comments: Pt reliant on UE support and fluctuating min guard to modA to maintain static sitting balance; pt with posterior/L lateral lean requiring verbal and tactile cues to correct. Postural control: Posterior lean;Left lateral lean Standing balance support: Bilateral upper extremity supported;During functional activity Standing balance-Leahy Scale: Poor Standing balance comment: Reliant on UE support and external assist, difficulty correct heavy posterior lean                           ADL either performed or assessed with clinical judgement   ADL Overall ADL's : Needs assistance/impaired Eating/Feeding: Set up;Sitting   Grooming: Set up;Sitting   Upper Body Bathing: Minimal assistance;Sitting Upper Body Bathing Details (indicate cue type and reason): reminders to sit up straight/maintain midline due to posterior and L sided leaning. Lower Body Bathing: Moderate assistance;Maximal assistance;Sitting/lateral leans;Sit to/from stand Lower Body Bathing Details (indicate cue type and reason): Mod/Max A due to balance concerns as well as L sided neglect/inattention requiring verbal and tactile cues Upper Body Dressing : Min guard;Sitting   Lower Body Dressing: Moderate assistance;Maximal assistance;Sitting/lateral leans;Sit to/from  stand  Lower Body Dressing Details (indicate cue type and reason): Mod/Max A due to L sided leaning and incoordination with fine motor skills, particularly in the L hand. Toilet Transfer: Moderate assistance;+2 for physical assistance;+2 for safety/equipment;Stand-pivot Toilet Transfer Details (indicate cue type and reason): Pt leans heavly to the L side and posteriorly upon standing, requires mod A to steady and +2 totransfers. Toileting- Clothing Manipulation and Hygiene: Moderate assistance;+2 for physical assistance;+2 for safety/equipment;Sitting/lateral lean;Sit to/from stand   Tub/ Shower Transfer: Moderate assistance;+2 for physical assistance;+2 for safety/equipment;Stand-pivot   Functional mobility during ADLs: Moderate assistance;+2 for physical assistance;+2 for safety/equipment General ADL Comments: Pt presenting with increased incoordination in L hand in particular for all dressing and grooming tasks. Additionally, pt requiring increased assistance for all mobility due to L sided inattention/neglect, where pt leans posteriorly and to the L.     Vision Baseline Vision/History: Wears glasses Wears Glasses: At all times Patient Visual Report: No change from baseline Vision Assessment?: Yes Eye Alignment: Within Functional Limits Ocular Range of Motion: Within Functional Limits Tracking/Visual Pursuits: Other (comment) (Pt having difficulty attending with increased stimuli in room, otherwise able to track WFL/) Visual Fields: Impaired-to be further tested in functional context (Pt demonstrating difficulty attending to task without verbal cueing.) Additional Comments: Pt able to read from menu, Simultaneous perception also tested and pt could identify when 1 object on either side was moving, however with both objects moved, pt only was able to recognize the object on the R side moving, extinguishing the image on the L.     Perception Perception Perception Tested?: Yes Perception  Deficits: Inattention/neglect;Spatial orientation Inattention/Neglect: Does not attend to left side of body;Impaired- to be further tested in functional context Spatial deficits: Has difficulty attending to L side of objects, per SLP note, pt drew the clock mainly on the R side.   Praxis Praxis Praxis tested?: Deficits Deficits: Initiation;Limb apraxia Praxis-Other Comments: Pt had difficulty with finger to nose assessment on both sides, however the L side was more difficult, as pt took longer to touch her nose and required cueing to resume task and touch OT 's finger.    Pertinent Vitals/Pain Pain Assessment: No/denies pain     Hand Dominance Right   Extremity/Trunk Assessment Upper Extremity Assessment Upper Extremity Assessment: Overall WFL for tasks assessed   Lower Extremity Assessment Lower Extremity Assessment: Defer to PT evaluation   Cervical / Trunk Assessment Cervical / Trunk Assessment: Normal   Communication Communication Communication: No difficulties   Cognition Arousal/Alertness: Awake/alert Behavior During Therapy: Flat affect Overall Cognitive Status: Impaired/Different from baseline Area of Impairment: Attention;Memory;Safety/judgement;Awareness;Problem solving                   Current Attention Level: Sustained Memory: Decreased short-term memory   Safety/Judgement: Decreased awareness of safety;Decreased awareness of deficits Awareness: Emergent Problem Solving: Slow processing;Decreased initiation;Difficulty sequencing;Requires verbal cues;Requires tactile cues General Comments: Pt demonstrating decreased L sided awareness/mild attentional neglect, as well as motor planning deficits and slow processing.   General Comments  VSS on RA, Husband in room    Exercises     Shoulder Instructions      [place] expects to be discharged to:: Private residence Living Arrangements: Spouse/significant other Available Help at  Discharge: Family;Available 24 hours/day Type of Home: House Home Access: Stairs to enter [employer] of Steps: 2-5 Entrance Stairs-Rails: Right Home Layout: One level     Bathroom Shower/Tub: Occupational psychologist: Handicapped height Bathroom Accessibility: Yes How  Accessible: Accessible via walker Home Equipment: Shower seat;Grab bars - tub/shower   Additional Comments: Husband retired and available for 24/7 assist  Lives With: Spouse    Prior Functioning/Environment Level of Independence: Independent        Comments: Independent without DME. 1x fall that led to admission, otherwise no falls. retired from work. enjoys reading        OT Problem List: Decreased strength;Decreased activity tolerance;Impaired balance (sitting and/or standing);Decreased coordination;Decreased safety awareness;Decreased knowledge of use of DME or AE      OT Treatment/Interventions: Self-care/ADL training;Therapeutic exercise;Energy conservation;DME and/or AE instruction;Therapeutic activities;Patient/family education;Balance training    OT Goals(Current goals can be found in the care plan section) Acute Rehab OT Goals Patient Stated Goal: Agreeble to CIR-level therapies to maximize recovery OT Goal Formulation: With patient/family Time For Goal Achievement: 06/11/21 Potential to Achieve Goals: Good ADL Goals Pt Will Perform Grooming: with supervision;standing Pt Will Perform Lower Body Bathing: with min guard assist;sitting/lateral leans;sit to/from stand Pt Will Perform Lower Body Dressing: with min guard assist;sitting/lateral leans;sit to/from stand Pt Will Transfer to Toilet: with supervision;ambulating Pt Will Perform Toileting - Clothing Manipulation and hygiene: with min guard assist;sitting/lateral leans;sit to/from stand Pt/caregiver will Perform Home Exercise Program: Increased strength;Both right and left upper extremity;Independently;With written HEP provided   OT Frequency: Min 2X/week   Barriers to D/C:            Co-evaluation              AM-PAC OT "6 Clicks" Daily Activity     Outcome Measure Help from another person eating meals?: A Little Help from another person taking care of personal grooming?: A Little Help from another person toileting, which includes using toliet, bedpan, or urinal?: A Lot Help from another person bathing (including washing, rinsing, drying)?: A Lot Help from another person to put on and taking off regular upper body clothing?: A Little Help from another person to put on and taking off regular lower body clothing?: A Lot 6 Click Score: 15   End of Session Equipment Utilized During Treatment: Gait belt Nurse Communication: Mobility status  Activity Tolerance: Patient limited by fatigue Patient left: in chair;with call bell/phone within reach;with nursing/sitter in room;with chair alarm set  OT Visit Diagnosis: Unsteadiness on feet (R26.81);Other abnormalities of gait and mobility (R26.89);Muscle weakness (generalized) (M62.81)                Time: 6578-4696 OT Time Calculation (min): 31 min Charges:  OT General Charges $OT Visit: 1 Visit OT Evaluation $OT Re-eval: 1 Re-eval OT Treatments $Self Care/Home Management : 8-22 mins  Reyna Lorenzi H., OTR/L Acute Rehabilitation  Kalon Erhardt Elane Jazlyne Gauger 05/28/2021, 1:51 PM

## 2021-05-28 NOTE — PMR Pre-admission (Signed)
PMR Admission Coordinator Pre-Admission Assessment  Patient: Michaela Rios is an 70 y.o., female MRN: [id number] DOB: 1951/05/06 Height: 5\' 3"  (160 cm) Weight: 60.8 kg  Insurance Information HMO:     PPO: yes     PCP:      IPA:      80/20:      OTHER:  PRIMARY: [employer] Medicare      Policy#: [id number]      Subscriber: pt CM Name: Ubaldo Glassing      Phone#: [phone]     Fax#: [phone] Pre-Cert#: [id number] [place] for CIR given by Lattie Haw  with [employer] on 06/07/21, for 5 days Medicare with updates due to [place]  at faxed listed above on 06/11/21      Employer:  Benefits:  Phone #: [phone]     Name:  Eff. Date: 11/28/20     Deduct: $0      Out of Pocket Max: $4500 ($0 met)      Life Max: n/a CIR: $295/day for days 1-6      SNF: 20 full days Outpatient:      Co-Pay: $35/day Home Health: 100%      Co-Pay:  DME: 80%     Co-Pay: 20% Providers:  SECONDARY:       Policy#:      Phone#:   Development worker, community:       Phone#:   The Therapist, art Information Summary" for patients in Inpatient Rehabilitation Facilities with attached "Privacy Act [place] Records" was provided and verbally reviewed with: Patient and Family  Emergency Contact Information Contact Information     Name Relation Home Work Mobile   Quayle,Kerry Spouse [phone]         Current Medical History  Patient Admitting Diagnosis: R BG/CR CVA; L caudate/thal CVA  History of Present Illness: Pt is a 70 y/o female with PMH of HTN, prediabetes, depression, CKD II, and PUD, migraines who presented to  on 6/27 with a fall and headache.  Workup in the ED revealed temp of 101.4, elevated d-dimer, tachycardia, and tachypnea.  Mildly elevated lactic acid.  C xray and UA was negative.  Covid negative.  She was admitted for SIRS workup.  On 6/29 husband noted increased confused and PT noted significant change in functional mobility.  CT showed new multifocal hypoattenuation on the basal galglia and thalami  bilaterally.  MRI was completed and showed R basal ganglia infarct as well as L caudate and thalamic infarct.  Neurology consulted and recommended LP and EEG. Which were non-concerning.  Therapy evaluations were completed and pt was recommended for CIR.   Complete NIHSS TOTAL: 3  Patient's medical record from [facility] has been reviewed by the rehabilitation admission coordinator and physician.  Past Medical History  Past Medical History:  Diagnosis Date   Anemia    Anxiety    Arthritis    Cataract    Chronic migraine    follows with neuro for same   Clotting disorder (HCC)    clot in ovarian vein- hx   Esophagitis    GERD (gastroesophageal reflux disease)    Hypertension    IBS (irritable bowel syndrome)    Low back pain syndrome    MRSA (methicillin resistant Staphylococcus aureus) infection 04/17/12   left torso   Peptic ulcer disease 2003, 12/2012   Peripheral vascular disease ([place])    Vitamin D deficiency     Family History   family history includes Aneurysm in her father; Lung cancer in  her brother; Prostate cancer in her brother.  Prior Rehab/Hospitalizations Has the patient had prior rehab or hospitalizations prior to admission? No  Has the patient had major surgery during 100 days prior to admission? No   Current Medications  Current Facility-Administered Medications:    acetaminophen (TYLENOL) tablet 650 mg, 650 mg, Oral, Q6H PRN, 650 mg at 05/25/21 0438 **OR** acetaminophen (TYLENOL) suppository 650 mg, 650 mg, Rectal, Q6H PRN, Orma Flaming, MD   amLODipine (NORVASC) tablet 5 mg, 5 mg, Oral, q AM, Orma Flaming, MD, 5 mg at 05/28/21 6962   aspirin suppository 300 mg, 300 mg, Rectal, Daily **OR** aspirin tablet 325 mg, 325 mg, Oral, Daily, Regalado, Belkys A, MD, 325 mg at 05/28/21 0943   aztreonam (AZACTAM) 2 g in sodium chloride 0.9 % 100 mL IVPB, 2 g, Intravenous, Q8H, Regalado, Belkys A, MD, Last Rate: 200 mL/hr at 05/28/21 1400, 2 g at 05/28/21 1400    buPROPion (WELLBUTRIN XL) 24 hr tablet 300 mg, 300 mg, Oral, Daily, Orma Flaming, MD, 300 mg at 05/28/21 0944   feeding supplement (ENSURE ENLIVE / ENSURE PLUS) liquid 237 mL, 237 mL, Oral, BID BM, Orma Flaming, MD, 237 mL at 05/28/21 1100   hydrALAZINE (APRESOLINE) tablet 25 mg, 25 mg, Oral, BID, Regalado, Belkys A, MD   HYDROcodone-acetaminophen (NORCO) 7.5-325 MG per tablet 1 tablet, 1 tablet, Oral, Q6H PRN, Regalado, Belkys A, MD, 1 tablet at 05/27/21 0133   lactated ringers infusion, , Intravenous, Continuous, Regalado, Belkys A, MD, Last Rate: 100 mL/hr at 05/28/21 1000, New Bag at 05/28/21 1000   LORazepam (ATIVAN) tablet 1 mg, 1 mg, Oral, BID PRN, Orma Flaming, MD, 1 mg at 05/27/21 2249   metoprolol succinate (TOPROL-XL) 24 hr tablet 100 mg, 100 mg, Oral, Daily, Orma Flaming, MD, 100 mg at 05/28/21 0944   multivitamin with minerals tablet 1 tablet, 1 tablet, Oral, Daily, Regalado, Belkys A, MD, 1 tablet at 05/28/21 0944   ondansetron (ZOFRAN) tablet 4 mg, 4 mg, Oral, Q6H PRN, Orma Flaming, MD   potassium chloride SA (KLOR-CON) CR tablet 40 mEq, 40 mEq, Oral, BID, Regalado, Belkys A, MD, 40 mEq at 05/28/21 0944   tiZANidine (ZANAFLEX) tablet 4 mg, 4 mg, Oral, BID PRN, Orma Flaming, MD   topiramate (TOPAMAX) tablet 150 mg, 150 mg, Oral, QHS, Orma Flaming, MD, 150 mg at 05/27/21 2244   venlafaxine XR (EFFEXOR-XR) 24 hr capsule 75 mg, 75 mg, Oral, Q breakfast, Orma Flaming, MD, 75 mg at 05/28/21 0944   vitamin B-12 (CYANOCOBALAMIN) tablet 1,000 mcg, 1,000 mcg, Oral, Daily, Regalado, Belkys A, MD, 1,000 mcg at 05/28/21 0943   zolpidem (AMBIEN) tablet 5 mg, 5 mg, Oral, QHS PRN, Orma Flaming, MD, 5 mg at 05/27/21 2244  Patients Current Diet:  Diet Order             Diet Heart Room service appropriate? Yes; Fluid consistency: Thin  Diet effective now                   Precautions / Restrictions Precautions Precautions: Fall, Other (comment) Precaution Comments:  urinary urgency/incontinence (has Depends in room) Restrictions Weight Bearing Restrictions: No   Has the patient had 2 or more falls or a fall with injury in the past year? No  Prior Activity Level Community (5-7x/wk): retired, no DME prior to admit, still driving  Prior Functional Level Self Care: Did the patient need help bathing, dressing, using the toilet or eating? Independent  Indoor Mobility: Did the patient need  assistance with walking from room to room (with or without device)? Independent  Stairs: Did the patient need assistance with internal or external stairs (with or without device)? Independent  Functional Cognition: Did the patient need help planning regular tasks such as shopping or remembering to take medications? Independent  Home Assistive Devices / Equipment Home Assistive Devices/Equipment: None Home Equipment: Shower seat, Grab bars - tub/shower  Prior Device Use: Indicate devices/aids used by the patient prior to current illness, exacerbation or injury? None of the above  Current Functional Level Cognition  Arousal/Alertness: Awake/alert Overall Cognitive Status: Impaired/Different from baseline Current Attention Level: Sustained Orientation Level: Oriented X4 Following Commands: Follows one step commands inconsistently, Follows one step commands with increased time Safety/Judgement: Decreased awareness of safety, Decreased awareness of deficits General Comments: Pt demonstrating decreased L sided awareness/mild attentional neglect, as well as motor planning deficits and slow processing. Attention: Focused, Sustained Focused Attention: Impaired Sustained Attention: Impaired Memory: Impaired Memory Impairment: Decreased recall of new information, Decreased short term memory Awareness: Impaired Problem Solving: Impaired Executive Function: Organizing, Self Monitoring Organizing: Impaired Self Monitoring: Impaired Safety/Judgment: Impaired     Extremity Assessment (includes Sensation/Coordination)  Upper Extremity Assessment: Overall WFL for tasks assessed  Lower Extremity Assessment: Defer to PT evaluation    ADLs  Overall ADL's : Needs assistance/impaired Eating/Feeding: Set up, Sitting Grooming: Set up, Sitting Upper Body Bathing: Minimal assistance, Sitting Upper Body Bathing Details (indicate cue type and reason): reminders to sit up straight/maintain midline due to posterior and L sided leaning. Lower Body Bathing: Moderate assistance, Maximal assistance, Sitting/lateral leans, Sit to/from stand Lower Body Bathing Details (indicate cue type and reason): Mod/Max A due to balance concerns as well as L sided neglect/inattention requiring verbal and tactile cues Upper Body Dressing : Min guard, Sitting Lower Body Dressing: Moderate assistance, Maximal assistance, Sitting/lateral leans, Sit to/from stand Lower Body Dressing Details (indicate cue type and reason): Mod/Max A due to L sided leaning and incoordination with fine motor skills, particularly in the L hand. Toilet Transfer: Moderate assistance, +2 for physical assistance, +2 for safety/equipment, Stand-pivot Toilet Transfer Details (indicate cue type and reason): Pt leans heavly to the L side and posteriorly upon standing, requires mod A to steady and +2 totransfers. Toileting- Clothing Manipulation and Hygiene: Moderate assistance, +2 for physical assistance, +2 for safety/equipment, Sitting/lateral lean, Sit to/from stand Tub/ Shower Transfer: Moderate assistance, +2 for physical assistance, +2 for safety/equipment, Stand-pivot Functional mobility during ADLs: Moderate assistance, +2 for physical assistance, +2 for safety/equipment General ADL Comments: Pt presenting with increased incoordination in L hand in particular for all dressing and grooming tasks. Additionally, pt requiring increased assistance for all mobility due to L sided inattention/neglect, where pt leans  posteriorly and to the L.    Mobility  Overal bed mobility: Needs Assistance Bed Mobility: Supine to Sit Supine to sit: Mod assist, HOB elevated Sit to supine: Min assist General bed mobility comments: Mod A to elevate trunk and square hips, Pt required additional time to process/plan how to come to sitting EOB.    Transfers  Overall transfer level: Needs assistance Equipment used: 2 person hand held assist Transfers: Sit to/from Stand, Stand Pivot Transfers Sit to Stand: Max assist, +2 physical assistance, +2 safety/equipment Stand pivot transfers: Mod assist, Max assist, +2 physical assistance, +2 safety/equipment General transfer comment: Pt unable to stand without HHA, requiring mod-maxA to elevate trunk and maintain stability, pt with strong posterior lean bracing BLEs against EOB. Additional stand pivot to recliner with  bilateral HHA and modA, pt able to take shuffling steps.    Ambulation / Gait / Stairs / Wheelchair Mobility  Ambulation/Gait Ambulation/Gait assistance: Counsellor (Feet): 150 Feet Assistive device: 1 person hand held assist, IV Pole, None Gait Pattern/deviations: Step-through pattern, Decreased stride length, Trunk flexed, Shuffle General Gait Details: Shuffling, pivotal steps from bed to recliner with bilateral HHA and modA for trunk stability; will require +2 assist for gait progression Gait velocity: Decreased    Posture / Balance Dynamic Sitting Balance Sitting balance - Comments: Pt reliant on UE support and fluctuating min guard to modA to maintain static sitting balance; pt with posterior/L lateral lean requiring verbal and tactile cues to correct. Balance Overall balance assessment: Needs assistance Sitting-balance support: Bilateral upper extremity supported, Feet supported Sitting balance-Leahy Scale: Poor Sitting balance - Comments: Pt reliant on UE support and fluctuating min guard to modA to maintain static sitting balance; pt with  posterior/L lateral lean requiring verbal and tactile cues to correct. Postural control: Posterior lean, Left lateral lean Standing balance support: Bilateral upper extremity supported, During functional activity Standing balance-Leahy Scale: Poor Standing balance comment: Reliant on UE support and external assist, difficulty correct heavy posterior lean    Special needs/care consideration Diabetic management yes, prediabetes   Previous Home Environment (from acute therapy documentation) Living Arrangements: Spouse/significant other  Lives With: Spouse Available Help at Discharge: Family, Available 24 hours/day Type of Home: House Home Layout: One level Home Access: Stairs to enter Entrance Stairs-Rails: Right Entrance Stairs-Number of Steps: 2-5 Bathroom Shower/Tub: Multimedia programmer: Handicapped height Bathroom Accessibility: Yes How Accessible: Accessible via walker Home Care Services: No Additional Comments: Husband retired and available for 24/7 assist  Discharge Living Setting Plans for Discharge Living Setting: Patient's home, Lives with (comment) (spouse) Type of Home at Discharge: House Discharge Home Layout: One level Discharge Home Access: Stairs to enter Entrance Stairs-Rails: None Entrance Stairs-Number of Steps: 3 Discharge Bathroom Shower/Tub: Walk-in shower Discharge Bathroom Toilet: Handicapped height Discharge Bathroom Accessibility: Yes How Accessible: Accessible via walker (have a water closet, so accessibility to the actual toilet will be challenging) Does the patient have any problems obtaining your medications?: No  Social/Family/Support Systems Patient Roles: Spouse Anticipated Caregiver: Modena Bellemare Anticipated Caregiver's Contact Information: [phone] Ability/Limitations of Caregiver: min assist, except toileting Caregiver Availability: 24/7 Discharge Plan Discussed with Primary Caregiver: Yes Is Caregiver In Agreement with  Plan?: Yes Does Caregiver/Family have Issues with Lodging/Transportation while Pt is in Rehab?: No  Goals Patient/Family Goal for Rehab: PT/OT supervision to min assist, SLP supervision Expected length of stay: 21-24 days Pt/Family Agrees to Admission and willing to participate: Yes Program Orientation Provided & Reviewed with Pt/Caregiver Including Roles  & Responsibilities: Yes  Barriers to Discharge: Decreased caregiver support, Insurance for SNF coverage  Decrease burden of Care through IP rehab admission: n/a   Possible need for SNF placement upon discharge: Not anticipated.  Explained that [facility] would not approve SNF directly from CIR.  Only option is to d/c home with support from spouse. He is agreeable and states he can assist.   Patient Condition: I have reviewed medical records from [facility], spoken with CM, and patient and spouse. I met with patient at the bedside for inpatient rehabilitation assessment.  Patient will benefit from ongoing PT, OT, and SLP, can actively participate in 3 hours of therapy a day 5 days of the week, and can make measurable gains during the admission.  Patient will also benefit from  the coordinated team approach during an Inpatient Acute Rehabilitation admission.  The patient will receive intensive therapy as well as Rehabilitation physician, nursing, social worker, and care management interventions.  Due to safety, skin/wound care, disease management, medication administration, pain management, and patient education the patient requires 24 hour a day rehabilitation nursing.  The patient is currently min assist with mobility and basic ADLs.  Discharge setting and therapy post discharge at home with home health is anticipated.  Patient has agreed to participate in the Acute Inpatient Rehabilitation Program and will admit pending insurance authorization 06/08/21 .  Preadmission Screen Completed By: Shann Medal, PT, DPT with updates by Clemens Catholic ,  05/28/2021 2:41 PM ______________________________________________________________________   Discussed status with Dr. Dagoberto Ligas  on 06/08/21 at  930and received approval for admission today.  Admission Coordinator: Michel Santee, PT, time 1030 Sudie Grumbling 06/08/21   Assessment/Plan: Diagnosis: Does the need for close, 24 hr/day Medical supervision in concert with the patient's rehab needs make it unreasonable for this patient to be served in a less intensive setting? Yes Co-Morbidities requiring supervision/potential complications: HTN, prediabetes, migraines, CKD II, PUD, depression; stroke with B/L weakness and cognitive issues. Due to bladder management, bowel management, safety, skin/wound care, disease management, medication administration, and patient education, does the patient require 24 hr/day rehab nursing? Yes Does the patient require coordinated care of a physician, rehab nurse, PT, OT, and SLP to address physical and functional deficits in the context of the above medical diagnosis(es)? Yes Addressing deficits in the following areas: balance, endurance, locomotion, strength, transferring, bowel/bladder control, bathing, dressing, feeding, grooming, toileting, and cognition Can the patient actively participate in an intensive therapy program of at least 3 hrs of therapy 5 days a week? Yes The potential for patient to make measurable gains while on inpatient rehab is good Anticipated functional outcomes upon discharge from inpatient rehab: supervision and min assist PT, supervision and min assist OT, supervision SLP Estimated rehab length of stay to reach the above functional goals is: 21-24 days Anticipated discharge destination: Home 10. Overall Rehab/Functional Prognosis: good   MD Signature:

## 2021-05-28 NOTE — Progress Notes (Signed)
PROGRESS NOTE    Michaela Rios  YFV:[id number] DOB: 1951-02-22 DOA: 05/24/2021 PCP: Binnie Rail, MD   Brief Narrative: 70 year old with past medical history significant for hypertension, prediabetes, depression, CKD stage II, history of PUD, chronic low back pain, migraine who presented to the ED with generalized weakness and loss of balance.  Patient fell on Sunday while trying to go to the bathroom and lost her balance.  She has been having shivering.  She denies cough, dysuria, abdominal pain.  She developed diarrhea overnight.  Patient was found to have high fever 101.4 on admission.  Elevated D-dimer.  CTA negative for PE.  Dopplers lower extremity negative for DVT. Admitted with SIRS criteria unclear etiology.   Assessment & Plan:   Principal Problem:   Sepsis ([place]) Active Problems:   Anxiety   Depression   Essential hypertension   Prediabetes   Hypokalemia   Tachycardia  1-SIRS;  -Presents with fever (6/27 tempeture 101) , tachycardia, tachypnea,  mild elevation of lactic acid. Confusion. -She had a second reading tempeture at 102, that was probably not accurate, because few minutes after recheck was normal.  -Chest x ray negative for PNA. UA; negative for UTI. COVID PCR negative -Urine culture no growth to date. and blood cultures: no growth to date.  -CT abdomen and pelvis: Negative for acute infectious process, hepatic asteatosis -No Nuchal rigidity. Complaints of chronic migraines.  -Continue with IV antibiotics aztreonam. Vancomycin and flagyl discontinue 6/29. Plan to complete 5 days antibiotics. Last day antibiotics today.  -ANA positive, Double strand DNA negative,  RF negative.   2-Acute infarct Right basal Ganglia:  Acute metabolic encephalopathy;  -Husband report patient had confusion episode 2/29. -CT: New multifocal hypoattenuation in the basilar ganglia and thalami bilaterally of indeterminate etiology.  Consideration include multifocal infarcts, toxic  metabolic insults, hypoxic ischemic injury and infection.  No mass-effect to suggest tumor. Correlate  with MRI.  -MRI: Right basal ganglia infarct, hyperintensity and mild enlargement of the left head of caudate, left thalamus and a small amount of hyperintensity in the right medial thalamus.  Vasogenic type edema in the deep white matter structures on the left.  Recommend postcontrast MRI to further evaluate. -MRI with Contrast: Mild, largely vascular/perivascular appearing enhancement in the areas of signal abnormality in the deep gray nuclei as well as a small amount of leptomeningeal enhancement over the right and possibly left parietal convexities.  No solid mass enhancing.  Differential Diagnosis: Cryptococcus, vasculitis, or unusual neoplasm and so as angiocentric lymphoma and granulomatosis/autoimmune or other inflammatory condition.  -LDL ;45. Discussed with Neurology no need for statins, LDL below goal.  -Continue with aspirin -No need for permissive HTN./   3-Abnormal enhancement deep gray nuclei and leptomeningeal enlargement. :  -LP elevated protein, no WBC, VDRL CSF pending, HSV SCF pending. Culture: no growth to date. Cytology pending, Oligoclonal bands pending, paraneoplastic encephalitis panel pending.  -EEG; negative for seizure.  -Labs; autoimmune work up: RF negative, , Sjogren's pending , ANCA, HIV non reactive, CRP 6.8 , ESR 37 , ANA positive, Double strand DNA negative. , B 1;  -She will need repeat MRI in one month.   Hypokalemia; replete orally.   HTN;  Continue with Norvasc, hydralazine and metoprolol.//  Tachycardia; -received IV fluids.   Generalized weakness.  IV fluids.  PT , OT  B 12. Low normal , will start supplement.   Depression;  Continue with Wellbutrin, Effexor.   CKD stage II Stable.   Migraines; continue with  topiromate.    Pulmonary Nodule. Needs to follow up with PCP.  Positive D dimer; Doppler LE negative for DVT> CTA negative for PE>    Nutrition Problem: Inadequate oral intake Etiology: decreased appetite    Signs/Symptoms: per patient/family report, meal completion < 25%    Interventions: Ensure Enlive (each supplement provides 350kcal and 20 grams of protein), Magic cup, MVI  Estimated body mass index is 23.74 kg/m as calculated from the following:   Height as of this encounter: _0  (1.6 m).   Weight as of this encounter: 60.8 kg.   DVT prophylaxis: Lovenox Code Status: Full code Family Communication: Husband who was at bedside Disposition Plan:  Status is: Inpatient  Remains inpatient appropriate because:IV treatments appropriate due to intensity of illness or inability to take PO  Dispo: The patient is from: Home              Anticipated d/c is to:  [facility]              Patient currently is medically stable to d/c.   Difficult to place patient No        Consultants:  None  Procedures:  none  Antimicrobials:  Vancomycin Aztreonam.   Subjective:  She is feeling better, report improvement of left arm weakness.  She denies diarrhea. She denies headaches.   Objective: Vitals:   05/28/21 0740 05/28/21 0852 05/28/21 1052 05/28/21 1242  BP: 132/85 137/82 126/78 126/78  Pulse: 100   100  Resp:    18  Temp:    98.5 F (36.9 C)  TempSrc:    Oral  SpO2:      Weight:      Height:        Intake/Output Summary (Last 24 hours) at 05/28/2021 1438 Last data filed at 05/28/2021 1244 Gross per 24 hour  Intake 2356.52 ml  Output 500 ml  Net 1856.52 ml    Filed Weights   05/24/21 0956  Weight: 60.8 kg    Examination:  General exam: NAD Respiratory system: CTA Cardiovascular system: S 1, S 2 RRR Gastrointestinal system: BS present, soft, nt Central nervous system: Alert , oriented, follows command left side 4-5/5 Extremities: no edema    Data Reviewed: I have personally reviewed following labs and imaging studies  CBC: Recent Labs  Lab 05/24/21 1005 05/25/21 0155  05/26/21 0236 05/27/21 0106 05/28/21 0211  WBC 8.6 8.1 6.1 7.2 6.6  HGB 11.6* 10.5* 11.1* 10.7* 10.3*  HCT 35.8* 32.9* 34.3* 32.2* 30.6*  MCV 92.0 93.2 91.0 88.7 89.0  PLT 243 199 221 249 275    Basic Metabolic Panel: Recent Labs  Lab 05/24/21 1005 05/24/21 1940 05/25/21 0155 05/26/21 0236 05/27/21 0106 05/28/21 0211  NA 139  --  137 132* 130* 134*  K 2.8*  --  3.9 3.6 3.4* 3.3*  CL 105  --  106 103 102 107  CO2 22  --  24 18* 18* 20*  GLUCOSE 121*  --  101* 94 97 107*  BUN 9  --  5* _1 CREATININE 0.77  --  0.71 0.70 0.74 0.64  CALCIUM 9.3  --  8.7* 8.9 8.4* 8.2*  MG  --  2.3  --   --   --   --     GFR: Estimated Creatinine Clearance: 54.1 mL/min (by C-G formula based on SCr of 0.64 mg/dL). Liver Function Tests: Recent Labs  Lab 05/24/21 1005 05/26/21 0236  AST 24 25  ALT  16 13  ALKPHOS 75 54  BILITOT 0.5 0.6  PROT 7.7 6.4*  ALBUMIN 3.4* 2.9*    No results for input(s): LIPASE, AMYLASE in the last 168 hours. No results for input(s): AMMONIA in the last 168 hours. Coagulation Profile: Recent Labs  Lab 05/24/21 1150  INR 1.1    Cardiac Enzymes: No results for input(s): CKTOTAL, CKMB, CKMBINDEX, TROPONINI in the last 168 hours. BNP (last 3 results) No results for input(s): PROBNP in the last 8760 hours. HbA1C: Recent Labs    05/27/21 0106  HGBA1C 5.9*   CBG: No results for input(s): GLUCAP in the last 168 hours. Lipid Profile: Recent Labs    05/27/21 0106  CHOL 96  HDL 24*  LDLCALC 45  TRIG 136  CHOLHDL 4.0    Thyroid Function Tests: Recent Labs    05/26/21 1603  TSH 1.904    Anemia Panel: Recent Labs    05/26/21 1603  VITAMINB12 254    Sepsis Labs: Recent Labs  Lab 05/24/21 1051 05/24/21 1227 05/24/21 1940  PROCALCITON  --   --  <0.10  LATICACIDVEN 2.1* 1.5  --      Recent Results (from the past 240 hour(s))  Blood culture (routine x 2)     Status: None (Preliminary result)   Collection Time: 05/24/21 10:51 AM    Specimen: BLOOD  Result Value Ref Range Status   Specimen Description BLOOD RIGHT ANTECUBITAL  Final   Special Requests   Final    BOTTLES DRAWN AEROBIC AND ANAEROBIC Blood Culture adequate volume   Culture   Final    NO GROWTH 4 DAYS Performed at [facility], [place] [address], [place],  [address]    Report Status PENDING  Incomplete  Resp Panel by RT-PCR (Flu A&B, Covid) Nasopharyngeal Swab     Status: None   Collection Time: 05/24/21 11:14 AM   Specimen: Nasopharyngeal Swab; Nasopharyngeal(NP) swabs in vial transport medium  Result Value Ref Range Status   SARS Coronavirus 2 by RT PCR NEGATIVE NEGATIVE Final    Comment: (NOTE) SARS-CoV-2 target nucleic acids are NOT DETECTED.  The SARS-CoV-2 RNA is generally detectable in upper respiratory specimens during the acute phase of infection. The lowest concentration of SARS-CoV-2 viral copies this assay can detect is 138 copies/mL. A negative result does not preclude SARS-Cov-2 infection and should not be used as the sole basis for treatment or other patient management decisions. A negative result may occur with  improper specimen collection/handling, submission of specimen other than nasopharyngeal swab, presence of viral mutation(s) within the areas targeted by this assay, and inadequate number of viral copies(<138 copies/mL). A negative result must be combined with clinical observations, patient history, and epidemiological information. The expected result is Negative.  Fact Sheet for Patients:  EntrepreneurPulse.com.au  Fact Sheet for Healthcare Providers:  IncredibleEmployment.be  This test is no t yet approved or cleared by the [place] FDA and  has been authorized for detection and/or diagnosis of SARS-CoV-2 by FDA under an Emergency Use Authorization (EUA). This EUA will remain  in effect (meaning this test can be used) for the duration of the COVID-19 declaration  under Section 564(b)(1) of the Act, 21 U.S.C.section 360bbb-3(b)(1), unless the authorization is terminated  or revoked sooner.       Influenza A by PCR NEGATIVE NEGATIVE Final   Influenza B by PCR NEGATIVE NEGATIVE Final    Comment: (NOTE) The Xpert Xpress SARS-CoV-2/FLU/RSV plus assay is intended as an aid in the  diagnosis of influenza from Nasopharyngeal swab specimens and should not be used as a sole basis for treatment. Nasal washings and aspirates are unacceptable for Xpert Xpress SARS-CoV-2/FLU/RSV testing.  Fact Sheet for Patients: EntrepreneurPulse.com.au  Fact Sheet for Healthcare Providers: IncredibleEmployment.be  This test is not yet approved or cleared by the [place] FDA and has been authorized for detection and/or diagnosis of SARS-CoV-2 by FDA under an Emergency Use Authorization (EUA). This EUA will remain in effect (meaning this test can be used) for the duration of the COVID-19 declaration under Section 564(b)(1) of the Act, 21 U.S.C. section 360bbb-3(b)(1), unless the authorization is terminated or revoked.  Performed at [facility], [place] [address] [place], [place] [address]   Blood culture (routine x 2)     Status: None (Preliminary result)   Collection Time: 05/24/21 12:03 PM   Specimen: BLOOD  Result Value Ref Range Status   Specimen Description BLOOD LEFT ANTECUBITAL  Final   Special Requests   Final    BOTTLES DRAWN AEROBIC AND ANAEROBIC Blood Culture adequate volume   Culture   Final    NO GROWTH 4 DAYS Performed at [facility], [place] [address], [place], [place] [address]    Report Status PENDING  Incomplete  Urine culture     Status: None   Collection Time: 05/24/21 12:48 PM   Specimen: Urine, Clean Catch  Result Value Ref Range Status   Specimen Description URINE, CLEAN CATCH  Final   Special Requests NONE  Final   Culture   Final    NO GROWTH Performed at [facility],  [place] [address] [place], [place] [address]    Report Status 05/25/2021 FINAL  Final  CSF culture w Gram Stain     Status: None (Preliminary result)   Collection Time: 05/27/21  7:14 PM   Specimen: CSF; Cerebrospinal Fluid  Result Value Ref Range Status   Specimen Description CSF  Final   Special Requests NONE  Final   Gram Stain   Final    WBC PRESENT, PREDOMINANTLY MONONUCLEAR NO ORGANISMS SEEN CYTOSPIN SMEAR    Culture   Final    NO GROWTH < 24 HOURS Performed at [facility], [place] [address] [place], [place] [address]    Report Status PENDING  Incomplete          Radiology Studies: CT ANGIO HEAD W OR WO CONTRAST  Result Date: 05/26/2021 CLINICAL DATA:  Stroke suspected. EXAM: CT ANGIOGRAPHY HEAD AND NECK TECHNIQUE: Multidetector CT imaging of the head and neck was performed using the standard protocol during bolus administration of intravenous contrast. Multiplanar CT image reconstructions and MIPs were obtained to evaluate the vascular anatomy. Carotid stenosis measurements (when applicable) are obtained utilizing NASCET criteria, using the distal internal carotid diameter as the denominator. CONTRAST:  9m OMNIPAQUE IOHEXOL 350 MG/ML SOLN COMPARISON:  None. FINDINGS: CTA NECK FINDINGS Aortic arch: Standard 3 vessel aortic arch with mild atherosclerotic plaque. Widely patent arch vessel origins. Right carotid system: Patent without evidence of stenosis, dissection, or significant atherosclerosis. Left carotid system: Patent with minimal calcified plaque at the carotid bifurcation. No evidence of dissection or stenosis. Tortuous mid cervical ICA. Vertebral arteries: Patent with the right being slightly larger than the left. No evidence of dissection or stenosis. Skeleton: Advanced disc degeneration at C5-6. Advanced facet arthrosis at C4-5 with grade 1 anterolisthesis. Other neck: No evidence of cervical lymphadenopathy or mass. Upper chest: No apical lung consolidation or mass.  Review of the MIP images confirms the above findings CTA HEAD FINDINGS Anterior circulation: The internal carotid arteries are patent from skull base to carotid termini with minimal nonstenotic plaque. ACAs and MCAs are patent without evidence of a proximal branch occlusion or significant proximal stenosis. No aneurysm is identified. Posterior circulation: The intracranial vertebral arteries are widely patent to the basilar. Patent bilateral PICA, left AICA, and bilateral SCA origins are visualized. A right AICA is not clearly identified. The basilar artery is widely patent. There are small posterior communicating arteries bilaterally. Both PCAs are patent without evidence of a significant proximal stenosis. No aneurysm is identified. Venous sinuses: As permitted by contrast timing, patent. Anatomic variants: None. Review of the MIP images confirms the above findings IMPRESSION: 1. Minimal atherosclerosis in the head and neck without large vessel occlusion or significant stenosis. 2. Aortic Atherosclerosis (ICD10-I70.0). Electronically Signed   By: Logan Bores M.D.   On: 05/26/2021 20:00   CT HEAD WO CONTRAST  Result Date: 05/26/2021 CLINICAL DATA:  Headache. EXAM: CT HEAD WITHOUT CONTRAST TECHNIQUE: Contiguous axial images were obtained from the base of the skull through the vertex without intravenous contrast. COMPARISON:  08/17/2020 FINDINGS: Brain: There is new multifocal hypoattenuation in the basal ganglia and thalami bilaterally. A band of low-density involving the right caudate and lentiform nuclei has an appearance suggestive of an acute lateral lenticulostriate territory infarct. No acute cortically based infarct, intracranial hemorrhage, midline shift, or extra-axial fluid collection is identified. Hypodensities in the periventricular white matter bilaterally are nonspecific but compatible with mild chronic small vessel ischemic disease. Vascular: No hyperdense vessel. Skull: No fracture or  suspicious osseous lesion. Sinuses/Orbits: Visualized paranasal sinuses and mastoid air cells are clear. Unremarkable orbits. Other: None. IMPRESSION: New multifocal hypoattenuation in the basal ganglia and thalami bilaterally of indeterminate etiology. Considerations include multifocal infarcts (including venous infarcts), toxic/metabolic insult, hypoxic-ischemic injury, and infection. No mass effect to suggest tumor. Correlate with pending MRI. Electronically Signed   By: Logan Bores M.D.   On: 05/26/2021 15:20   CT ANGIO NECK W OR WO CONTRAST  Result Date: 05/26/2021 CLINICAL DATA:  Stroke suspected. EXAM: CT ANGIOGRAPHY HEAD AND NECK TECHNIQUE: Multidetector CT imaging of the head and neck was performed using the standard protocol during bolus administration of intravenous contrast. Multiplanar CT image reconstructions and MIPs were obtained to evaluate the vascular anatomy. Carotid stenosis measurements (when applicable) are obtained utilizing NASCET criteria, using the distal internal carotid diameter as the denominator. CONTRAST:  67m OMNIPAQUE IOHEXOL 350 MG/ML SOLN COMPARISON:  None. FINDINGS: CTA NECK FINDINGS Aortic arch: Standard 3 vessel aortic arch with mild atherosclerotic plaque. Widely patent arch vessel origins. Right carotid system: Patent without evidence of stenosis, dissection, or significant atherosclerosis. Left carotid system: Patent with minimal calcified plaque at the carotid bifurcation. No evidence of dissection or stenosis. Tortuous mid cervical ICA. Vertebral arteries: Patent with the right being slightly larger than the left. No evidence of dissection or stenosis. Skeleton: Advanced disc degeneration at C5-6. Advanced facet arthrosis at C4-5 with grade 1 anterolisthesis. Other neck: No evidence of cervical lymphadenopathy or mass. Upper chest: No apical lung consolidation or mass. Review of the MIP images confirms the above findings CTA HEAD FINDINGS Anterior circulation: The  internal carotid arteries are patent from skull base to carotid termini with minimal nonstenotic plaque. ACAs and MCAs are patent without evidence of a proximal branch occlusion or significant proximal stenosis. No aneurysm is identified. Posterior circulation: The intracranial vertebral arteries are widely patent  to the basilar. Patent bilateral PICA, left AICA, and bilateral SCA origins are visualized. A right AICA is not clearly identified. The basilar artery is widely patent. There are small posterior communicating arteries bilaterally. Both PCAs are patent without evidence of a significant proximal stenosis. No aneurysm is identified. Venous sinuses: As permitted by contrast timing, patent. Anatomic variants: None. Review of the MIP images confirms the above findings IMPRESSION: 1. Minimal atherosclerosis in the head and neck without large vessel occlusion or significant stenosis. 2. Aortic Atherosclerosis (ICD10-I70.0). Electronically Signed   By: Logan Bores M.D.   On: 05/26/2021 20:00   MR BRAIN WO CONTRAST  Result Date: 05/26/2021 CLINICAL DATA:  Acute neuro deficit.  Abnormal CT.  Fever EXAM: MRI HEAD WITHOUT CONTRAST TECHNIQUE: Multiplanar, multiecho pulse sequences of the brain and surrounding structures were obtained without intravenous contrast. COMPARISON:  CT head 02/29/2022, 08/17/2020 FINDINGS: Brain: Focal area of restricted diffusion in the right lenticulostriate distribution compatible with acute infarct. This area also shows increased signal on T2 and FLAIR. Mild edema in the deep white matter tracts on the right. Small amount of FLAIR hyperintensity right medial thalamus. Additional areas of increased signal on T2 and FLAIR involving the head of caudate on the left and left thalamus without restricted diffusion. There is mild enlargement of the head of the caudate on the left. There is also asymmetric white matter hyperintensity in the deep white matter tracks of the left external capsule  and left internal capsule. These areas do not show restricted diffusion. Ventricle size normal.  No midline shift.  Negative for hemorrhage Vascular: Normal arterial flow voids. Skull and upper cervical spine: Negative Sinuses/Orbits: Minimal mucosal edema paranasal sinuses. Negative orbit Other: None IMPRESSION: Restricted diffusion in the right basal ganglia corresponding to lenticulostriate territory. This is most consistent with acute infarction Hyperintensity and mild enlargement of the left head of caudate. Hyperintensity also in the left thalamus and a small amount of hyperintensity in the right medial thalamus. There is vasogenic type edema in the deep white matter structures on the left and to lesser extent in the deep white matter structures on the right. No restricted diffusion on the left. This is an unusual pattern. I would recommend postcontrast imaging of the brain to rule out tumor on the left. Subacute ischemia on the left appears unlikely given the vasogenic edema and unusual appearance of the head of the caudate and thalamus on the left. Also consider infection/cerebritis. Opportunistic infection such as toxoplasmosis possible. These results were called by telephone at the time of interpretation on 05/26/2021 at 4:10 pm to provider [facility], who verbally acknowledged these results. Electronically Signed   By: Franchot Gallo M.D.   On: 05/26/2021 16:10   MR BRAIN W CONTRAST  Result Date: 05/26/2021 CLINICAL DATA:  Follow-up abnormal brain MRI earlier today. EXAM: MRI HEAD WITH CONTRAST TECHNIQUE: Multiplanar, multiecho pulse sequences of the brain and surrounding structures were obtained with intravenous contrast. CONTRAST:  6.75m GADAVIST GADOBUTROL 1 MMOL/ML IV SOLN COMPARISON:  Noncontrast brain MRI earlier today FINDINGS: There is very mild enhancement within the previously seen regions of signal abnormality in the basal ganglia and thalami. Much of this enhancement appears vascular and  perivascular. No solid enhancing mass is present. There is focal abnormal leptomeningeal enhancement over the right parietal convexity (series 6, image 6), and there may also be minimal leptomeningeal enhancement over the left parietal convexity (series 6, image 19). IMPRESSION: Mild, largely vascular/perivascular appearing enhancement in the areas of signal abnormality  in the deep gray nuclei as well as a small amount of leptomeningeal enhancement over the right and possibly left parietal convexities. No solid enhancing mass. Differential diagnosis includes infection (including cryptococcus), vasculitis, unusual neoplasm (such as angiocentric lymphoma), and granulomatous/autoimmune/other inflammatory conditions. Electronically Signed   By: Logan Bores M.D.   On: 05/26/2021 20:30   EEG adult  Result Date: 05/27/2021 Lora Havens, MD     05/27/2021  6:42 PM Patient Name: KALEE BROXTON MRN: [id number] Epilepsy Attending: Lora Havens Referring Physician/Provider: Dr Rosalin Hawking Date: 05/27/2021 Duration: 24.11 mins Patient history: 70yo F presented with falling at home, generalized weakness, fever, difficult waking with imbalance, lack of appetite. MRI showed hyperintensity and mild enlargement of the left head of caudate. EEG to evaluate for seizure Level of alertness: Awake AEDs during EEG study: TPM Technical aspects: This EEG study was done with scalp electrodes positioned according to the 10-20 International system of electrode placement. Electrical activity was acquired at a sampling rate of _0  and reviewed with a high frequency filter of _1  and a low frequency filter of _2 . EEG data were recorded continuously and digitally stored. Description: The posterior dominant rhythm consists of 9 Hz activity of moderate voltage (25-35 uV) seen predominantly in posterior head regions, symmetric and reactive to eye opening and eye closing. EEG showed intermittent 3 to 6 Hz theta-delta slowing in left  temporal  region. Single sharp transient was seen in left temporal region. Hyperventilation and photic stimulation were not performed.   ABNORMALITY - Intermittent slow, generalized IMPRESSION: This study is suggestive of cortical dysfunction arising from left temporal region, nonspecific etiology. No seizures or definite epileptiform discharges were seen throughout the recording. If suspicion for interictal activity remains a concern, a prolonged study including sleep should be considered. Lora Havens   ECHOCARDIOGRAM COMPLETE  Result Date: 05/26/2021    ECHOCARDIOGRAM REPORT   Patient Name:   BRIGET SHAHEED [facility] Date of Exam: 05/26/2021 Medical Rec #:  [id number]         Height:       63.0 in Accession #:    [id number]        Weight:       134.0 lb Date of Birth:  1951-08-10         BSA:          1.631 m Patient Age:    52 years          BP:           140/97 mmHg Patient Gender: F                 HR:           94 bpm. Exam Location:  Inpatient Procedure: 2D Echo, Cardiac Doppler and Color Doppler Indications:    Stroke  History:        Patient has no prior history of Echocardiogram examinations,                 most recent 08/18/2020. Risk Factors:Hypertension. CKD. Sepsis.  Sonographer:    Clayton Lefort RDCS (AE) Referring Phys: [id number] Amardeep Beckers A Jenavee Laguardia IMPRESSIONS  1. Left ventricular ejection fraction, by estimation, is 60 to 65%. The left ventricle has normal function. The left ventricle has no regional wall motion abnormalities. There is moderate left ventricular hypertrophy. Left ventricular diastolic parameters are consistent with Grade I diastolic dysfunction (impaired relaxation).  2. Right ventricular systolic function is normal. The right ventricular size is normal.  Tricuspid regurgitation signal is inadequate for assessing PA pressure.  3. The mitral valve is normal in structure. Mild mitral valve regurgitation. No evidence of mitral stenosis.  4. The aortic valve is grossly normal. There is mild  calcification of the aortic valve. Aortic valve regurgitation is not visualized. Mild aortic valve sclerosis is present, with no evidence of aortic valve stenosis.  5. The inferior vena cava is normal in size with greater than 50% respiratory variability, suggesting right atrial pressure of 3 mmHg. Conclusion(s)/Recommendation(s): No intracardiac source of embolism detected on this transthoracic study. A transesophageal echocardiogram is recommended to exclude cardiac source of embolism if clinically indicated. FINDINGS  Left Ventricle: Left ventricular ejection fraction, by estimation, is 60 to 65%. The left ventricle has normal function. The left ventricle has no regional wall motion abnormalities. The left ventricular internal cavity size was normal in size. There is  moderate left ventricular hypertrophy. Left ventricular diastolic parameters are consistent with Grade I diastolic dysfunction (impaired relaxation). Right Ventricle: The right ventricular size is normal. No increase in right ventricular wall thickness. Right ventricular systolic function is normal. Tricuspid regurgitation signal is inadequate for assessing PA pressure. Left Atrium: Left atrial size was normal in size. Right Atrium: Right atrial size was normal in size. Pericardium: There is no evidence of pericardial effusion. Mitral Valve: The mitral valve is normal in structure. Mild mitral valve regurgitation. No evidence of mitral valve stenosis. MV peak gradient, 2.9 mmHg. The mean mitral valve gradient is 1.0 mmHg. Tricuspid Valve: The tricuspid valve is normal in structure. Tricuspid valve regurgitation is not demonstrated. No evidence of tricuspid stenosis. Aortic Valve: The aortic valve is grossly normal. There is mild calcification of the aortic valve. Aortic valve regurgitation is not visualized. Mild aortic valve sclerosis is present, with no evidence of aortic valve stenosis. Aortic valve mean gradient  measures 3.0 mmHg. Aortic valve  peak gradient measures 5.8 mmHg. Aortic valve area, by VTI measures 2.97 cm. Pulmonic Valve: The pulmonic valve was normal in structure. Pulmonic valve regurgitation is trivial. No evidence of pulmonic stenosis. Aorta: The aortic root is normal in size and structure. Venous: The inferior vena cava is normal in size with greater than 50% respiratory variability, suggesting right atrial pressure of 3 mmHg. IAS/Shunts: No atrial level shunt detected by color flow Doppler.  LEFT VENTRICLE PLAX 2D LVIDd:         3.40 cm  Diastology LVIDs:         2.20 cm  LV e' medial:    8.70 cm/s LV PW:         1.30 cm  LV E/e' medial:  7.3 LV IVS:        1.40 cm  LV e' lateral:   13.20 cm/s LVOT diam:     2.00 cm  LV E/e' lateral: 4.8 LV SV:         65 LV SV Index:   40 LVOT Area:     3.14 cm  RIGHT VENTRICLE             IVC RV Basal diam:  2.60 cm     IVC diam: 1.30 cm RV S prime:     18.50 cm/s TAPSE (M-mode): 2.8 cm LEFT ATRIUM           Index       RIGHT ATRIUM           Index LA diam:      2.50 cm 1.53 cm/m  RA Area:  12.50 cm LA Vol (A2C): 38.7 ml 23.73 ml/m RA Volume:   25.60 ml  15.70 ml/m LA Vol (A4C): 24.4 ml 14.96 ml/m  AORTIC VALVE AV Area (Vmax):    2.96 cm AV Area (Vmean):   2.80 cm AV Area (VTI):     2.97 cm AV Vmax:           120.00 cm/s AV Vmean:          84.100 cm/s AV VTI:            0.219 m AV Peak Grad:      5.8 mmHg AV Mean Grad:      3.0 mmHg LVOT Vmax:         113.00 cm/s LVOT Vmean:        74.900 cm/s LVOT VTI:          0.207 m LVOT/AV VTI ratio: 0.95  AORTA Ao Root diam: 3.30 cm Ao Asc diam:  3.40 cm MITRAL VALVE MV Area (PHT): 3.68 cm    SHUNTS MV Area VTI:   3.39 cm    Systemic VTI:  0.21 m MV Peak grad:  2.9 mmHg    Systemic Diam: 2.00 cm MV Mean grad:  1.0 mmHg MV Vmax:       0.85 m/s MV Vmean:      54.7 cm/s MV Decel Time: 206 msec MV E velocity: 63.40 cm/s MV A velocity: 76.00 cm/s MV E/A ratio:  0.83 Cherlynn Kaiser MD Electronically signed by Cherlynn Kaiser MD Signature Date/Time:  05/26/2021/7:31:57 PM    Final         Scheduled Meds:  amLODipine  5 mg Oral q AM   aspirin  300 mg Rectal Daily   Or   aspirin  325 mg Oral Daily   buPROPion  300 mg Oral Daily   feeding supplement  237 mL Oral BID BM   hydrALAZINE  25 mg Oral BID   metoprolol succinate  100 mg Oral Daily   multivitamin with minerals  1 tablet Oral Daily   potassium chloride  40 mEq Oral BID   topiramate  150 mg Oral QHS   venlafaxine XR  75 mg Oral Q breakfast   vitamin B-12  1,000 mcg Oral Daily   Continuous Infusions:  aztreonam 2 g (05/28/21 1400)   lactated ringers 100 mL/hr at 05/28/21 1000     LOS: 4 days    Time spent: 35 minutes.     Elmarie Shiley, MD [facility]   If 7PM-7AM, please contact night-coverage [email or web address]  05/28/2021, 2:38 PM

## 2021-05-28 NOTE — Progress Notes (Signed)
Physical Therapy Treatment Patient Details Name: Michaela Rios MRN: [id number] DOB: 07-Nov-1951 Today's Date: 05/28/2021    History of Present Illness Pt is a 70 y.o. female admitted 05/24/21 with c/o weakness, fall due to loss of balance, poor PO intake. Workup for sepsis, unclear etiology. Pt with confusion, neuro consult for possible CNS infection. Brain MRI 6/29 showed R basal ganglia stroke in addition to accelerated small vessel disease. PMH includes HTN, CKD 3, migraines, chronic LBP, prediabetes, anxiety, depression.   PT Comments    Pt progressing with mobility; improved alertness and participation this afternoon. Today's session focused on transfers, seated/standing balance and initiating steps, pt requiring up to mod-maxA. Pt remains limited by weakness, poor balance strategies/postural reactions (strong posterior and L-lateral lean), and cognitive impairment, including slowed processing, difficulty with motor planning and decreased attention. Pt motivated to participate and regain PLOF; pt's husband remains present and supportive. Continue to recommend intensive CIR-level therapies to maximize functional mobility and independence prior to return home.     Follow Up Recommendations  CIR;Supervision/Assistance - 24 hour     Equipment Recommendations   (TBD)    Recommendations for Other Services       Precautions / Restrictions Precautions Precautions: Fall;Other (comment) Precaution Comments: urinary urgency/incontinence (has Depends in room) Restrictions Weight Bearing Restrictions: No    Mobility  Bed Mobility Overal bed mobility: Needs Assistance Bed Mobility: Sit to Supine       Sit to supine: Mod assist   General bed mobility comments: Significant increased time and effort to complete task of "lay down"; pt initiating sidelying from sitting EOB >60-sec eventually requiring cues to complete task, then additional step by step cues for returning BLEs to bed and  rolling to supine, modA for BLE management    Transfers Overall transfer level: Needs assistance Equipment used: 1 person hand held assist;None Transfers: Sit to/from Stand Sit to Stand: Mod assist Stand pivot transfers: Mod assist       General transfer comment: Initial stand from recliner with modA for trunk elevation with HHA, pivot to bed with modA and short, shuffling steps; multiple additional sit<>stands from EOB with mod-maxA for trnk elevation, pt standing to back of recliner for sturdy handle support once standing; heavy posterior lean requiring frequent cues for anterior weight translation and steps away from bracing BLEs against EOB; modA for eccentric control to sitting  Ambulation/Gait Ambulation/Gait assistance: Mod assist Gait Distance (Feet): 2 Feet Assistive device: 1 person hand held assist Gait Pattern/deviations: Step-to pattern;Trunk flexed;Leaning posteriorly;Narrow base of support Gait velocity: Decreased   General Gait Details: Pt able to take side steps at EOB and a couple steps forwards/backwards with modA and RUE support; frequent verbal/tactile cues to correct posterior and L lateral lean   Stairs             Wheelchair Mobility    Modified Rankin (Stroke Patients Only) Modified Rankin (Stroke Patients Only) Pre-Morbid Rankin Score: No significant disability Modified Rankin: Moderately severe disability     Balance Overall balance assessment: Needs assistance Sitting-balance support: Bilateral upper extremity supported;Feet supported;No upper extremity supported;Single extremity supported Sitting balance-Leahy Scale: Fair Sitting balance - Comments: Initially able to maintain sitting balance without UE support; fluctuates between needing UE support to maintain upright versus min-modA; pt able to correct to midline posture with verbal cues Postural control: Posterior lean;Left lateral lean Standing balance support: Bilateral upper extremity  supported;During functional activity;Single extremity supported Standing balance-Leahy Scale: Poor Standing balance comment: Reliant on UE support  and external assist, difficulty correcting heavy posterior lean                            Cognition Arousal/Alertness: Awake/alert Behavior During Therapy: Flat affect Overall Cognitive Status: Impaired/Different from baseline Area of Impairment: Attention;Memory;Safety/judgement;Awareness;Problem solving;Following commands                   Current Attention Level: Sustained Memory: Decreased short-term memory Following Commands: Follows one step commands inconsistently;Follows one step commands with increased time Safety/Judgement: Decreased awareness of safety;Decreased awareness of deficits Awareness: Emergent Problem Solving: Slow processing;Decreased initiation;Difficulty sequencing;Requires verbal cues;Requires tactile cues General Comments: Improved alertness and conversation today, still endorses fatigue. Pt continues to demonstrate motor planning deficits, slowed processing, decreased attention      Exercises      General Comments General comments (skin integrity, edema, etc.): Husband present and supportive      Pertinent Vitals/Pain Pain Assessment: No/denies pain Pain Intervention(s): Monitored during session    Home Living                      Prior Function            PT Goals (current goals can now be found in the care plan section) Progress towards PT goals: Progressing toward goals    Frequency    Min 4X/week      PT Plan Current plan remains appropriate    Co-evaluation              AM-PAC PT "6 Clicks" Mobility   Outcome Measure  Help needed turning from your back to your side while in a flat bed without using bedrails?: A Little Help needed moving from lying on your back to sitting on the side of a flat bed without using bedrails?: A Lot Help needed moving to  and from a bed to a chair (including a wheelchair)?: A Lot Help needed standing up from a chair using your arms (e.g., wheelchair or bedside chair)?: A Lot Help needed to walk in hospital room?: A Lot Help needed climbing 3-5 steps with a railing? : Total 6 Click Score: 12    End of Session Equipment Utilized During Treatment: Gait belt Activity Tolerance: Patient tolerated treatment well Patient left: in bed;with call bell/phone within reach;with bed alarm set;with family/visitor present Nurse Communication: Mobility status PT Visit Diagnosis: Other abnormalities of gait and mobility (R26.89);Muscle weakness (generalized) (M62.81);Other symptoms and signs involving the nervous system (R29.898)     Time: 4628-6381 PT Time Calculation (min) (ACUTE ONLY): 33 min  Charges:  $Therapeutic Activity: 8-22 mins $Neuromuscular Re-education: 8-22 mins                     Mabeline Caras, PT, DPT Acute Rehabilitation Services  Pager [phone] Office [place] 05/28/2021, 5:38 PM

## 2021-05-29 DIAGNOSIS — R7303 Prediabetes: Secondary | ICD-10-CM

## 2021-05-29 DIAGNOSIS — E876 Hypokalemia: Secondary | ICD-10-CM

## 2021-05-29 DIAGNOSIS — N183 Chronic kidney disease, stage 3 unspecified: Secondary | ICD-10-CM

## 2021-05-29 DIAGNOSIS — I776 Arteritis, unspecified: Secondary | ICD-10-CM

## 2021-05-29 LAB — CULTURE, BLOOD (ROUTINE X 2)
Culture: NO GROWTH
Culture: NO GROWTH
Special Requests: ADEQUATE
Special Requests: ADEQUATE

## 2021-05-29 LAB — HEPATITIS PANEL, ACUTE
HCV Ab: NONREACTIVE
Hep A IgM: NONREACTIVE
Hep B C IgM: NONREACTIVE
Hepatitis B Surface Ag: NONREACTIVE

## 2021-05-29 LAB — VITAMIN B1: Vitamin B1 (Thiamine): 115.9 nmol/L (ref 66.5–200.0)

## 2021-05-29 MED ORDER — POTASSIUM CHLORIDE CRYS ER 20 MEQ PO TBCR
40.0000 meq | EXTENDED_RELEASE_TABLET | Freq: Two times a day (BID) | ORAL | Status: AC
  Administered 2021-05-29 (×2): 40 meq via ORAL
  Filled 2021-05-29 (×2): qty 2

## 2021-05-29 NOTE — Plan of Care (Signed)
  Problem: Education: Goal: Knowledge of General Education information will improve Description: Including pain rating scale, medication(s)/side effects and non-pharmacologic comfort measures Outcome: Progressing   Problem: Health Behavior/Discharge Planning: Goal: Ability to manage health-related needs will improve Outcome: Progressing   Problem: Clinical Measurements: Goal: Ability to maintain clinical measurements within normal limits will improve Outcome: Progressing Goal: Will remain free from infection Outcome: Progressing Goal: Diagnostic test results will improve Outcome: Progressing Goal: Respiratory complications will improve Outcome: Progressing Goal: Cardiovascular complication will be avoided Outcome: Progressing   Problem: Activity: Goal: Risk for activity intolerance will decrease Outcome: Progressing   Problem: Nutrition: Goal: Adequate nutrition will be maintained Outcome: Progressing   Problem: Coping: Goal: Level of anxiety will decrease Outcome: Progressing   Problem: Elimination: Goal: Will not experience complications related to bowel motility Outcome: Progressing Goal: Will not experience complications related to urinary retention Outcome: Progressing   Problem: Pain Managment: Goal: General experience of comfort will improve Outcome: Progressing   Problem: Safety: Goal: Ability to remain free from injury will improve Outcome: Progressing   Problem: Skin Integrity: Goal: Risk for impaired skin integrity will decrease Outcome: Progressing   Problem: Fluid Volume: Goal: Hemodynamic stability will improve Outcome: Progressing   Problem: Clinical Measurements: Goal: Diagnostic test results will improve Outcome: Progressing Goal: Signs and symptoms of infection will decrease Outcome: Progressing   Problem: Respiratory: Goal: Ability to maintain adequate ventilation will improve Outcome: Progressing   Problem: Education: Goal:  Knowledge of disease or condition will improve Outcome: Progressing Goal: Knowledge of secondary prevention will improve Outcome: Progressing Goal: Knowledge of patient specific risk factors addressed and post discharge goals established will improve Outcome: Progressing   Problem: Coping: Goal: Will verbalize positive feelings about self Outcome: Progressing Goal: Will identify appropriate support needs Outcome: Progressing   Problem: Health Behavior/Discharge Planning: Goal: Ability to manage health-related needs will improve Outcome: Progressing   Problem: Self-Care: Goal: Ability to participate in self-care as condition permits will improve Outcome: Progressing Goal: Verbalization of feelings and concerns over difficulty with self-care will improve Outcome: Progressing Goal: Ability to communicate needs accurately will improve Outcome: Progressing   Problem: Nutrition: Goal: Risk of aspiration will decrease Outcome: Progressing   Problem: Ischemic Stroke/TIA Tissue Perfusion: Goal: Complications of ischemic stroke/TIA will be minimized Outcome: Progressing

## 2021-05-29 NOTE — Progress Notes (Signed)
Physical Therapy Treatment Patient Details Name: Michaela Rios MRN: [id number] DOB: Apr 17, 1951 Today's Date: 05/29/2021    History of Present Illness Pt is a 70 y.o. female admitted 05/24/21 with c/o weakness, fall due to loss of balance, poor PO intake. Workup for sepsis, unclear etiology. Pt with confusion, neuro consult for possible CNS infection. Brain MRI 6/29 showed R basal ganglia stroke in addition to accelerated small vessel disease. PMH includes HTN, CKD 3, migraines, chronic LBP, prediabetes, anxiety, depression.    PT Comments    Pt making steady progress toward goals.  Emphasis on normalized movement, scooting to/from edge of chair asymmetrically/symmetrically, standing activity/pregait, short distance gait training forward and back working on w/shift and controlled heel/toe pattern including general static and dynamic balance tasks.    Follow Up Recommendations  CIR;Supervision/Assistance - 24 hour     Equipment Recommendations  None recommended by PT    Recommendations for Other Services       Precautions / Restrictions Precautions Precautions: Fall    Mobility  Bed Mobility               General bed mobility comments: in recliner on arrival    Transfers Overall transfer level: Needs assistance   Transfers: Sit to/from Stand Sit to Stand: Min assist Stand pivot transfers: Mod assist       General transfer comment: cues for setting up safe tranfer, work on scooting forward and backward to/from edge of the chair.   assist forward and boost.  Ambulation/Gait Ambulation/Gait assistance: Mod assist Gait Distance (Feet): 5 Feet (forward and back x3) Assistive device: Rolling walker (2 wheeled);1 person hand held assist (chair back) Gait Pattern/deviations: Step-through pattern   Gait velocity interpretation: <1.8 ft/sec, indicate of risk for recurrent falls General Gait Details: working on w/shift-unweighting, toe off/ heell contact, step  back   Stairs             Wheelchair Mobility    Modified Rankin (Stroke Patients Only) Modified Rankin (Stroke Patients Only) Modified Rankin: Moderately severe disability     Balance     Sitting balance-Leahy Scale: Fair     Standing balance support: Single extremity supported;No upper extremity supported;During functional activity Standing balance-Leahy Scale: Fair Standing balance comment: work in standing for over 10 min on w/shifting, unweighting, holding w/shift R, stepping the marching higher in place, balance with/without holding to stationary object.                            Cognition Arousal/Alertness: Awake/alert Behavior During Therapy: Flat affect Overall Cognitive Status: Within Functional Limits for tasks assessed (NT formally)                                        Exercises Other Exercises Other Exercises: LE hip/knee graded resistance ROM for control    General Comments        Pertinent Vitals/Pain Pain Assessment: Faces Faces Pain Scale: No hurt    Home Living                      Prior Function            PT Goals (current goals can now be found in the care plan section) Acute Rehab PT Goals Patient Stated Goal: Agreeble to CIR-level therapies to maximize recovery PT Goal Formulation: With family  Time For Goal Achievement: 06/08/21 Potential to Achieve Goals: Good Progress towards PT goals: Progressing toward goals    Frequency    Min 4X/week      PT Plan Current plan remains appropriate    Co-evaluation              AM-PAC PT "6 Clicks" Mobility   Outcome Measure  Help needed turning from your back to your side while in a flat bed without using bedrails?: A Little Help needed moving from lying on your back to sitting on the side of a flat bed without using bedrails?: A Lot Help needed moving to and from a bed to a chair (including a wheelchair)?: A Lot Help needed  standing up from a chair using your arms (e.g., wheelchair or bedside chair)?: A Little Help needed to walk in hospital room?: A Lot Help needed climbing 3-5 steps with a railing? : Total 6 Click Score: 13    End of Session   Activity Tolerance: Patient tolerated treatment well Patient left: in chair;with call bell/phone within reach Nurse Communication: Mobility status PT Visit Diagnosis: Other abnormalities of gait and mobility (R26.89);Muscle weakness (generalized) (M62.81);Other symptoms and signs involving the nervous system (R29.898)     Time: 1194-1740 PT Time Calculation (min) (ACUTE ONLY): 25 min  Charges:  $Therapeutic Activity: 8-22 mins $Neuromuscular Re-education: 8-22 mins                     05/29/2021  Ginger Carne., PT Acute Rehabilitation Services [phone]  (pager) [phone]  (office)   Tessie Fass Rafia Shedden 05/29/2021, 6:01 PM

## 2021-05-29 NOTE — Progress Notes (Addendum)
PROGRESS NOTE    Michaela Rios  CHE:[id number] DOB: Aug 12, 1951 DOA: 05/24/2021 PCP: Binnie Rail, MD   Brief Narrative: 70 year old with past medical history significant for hypertension, prediabetes, depression, CKD stage II, history of PUD, chronic low back pain, migraine who presented to the ED with generalized weakness and loss of balance.  Patient fell on Sunday while trying to go to the bathroom and lost her balance.  She has been having shivering.  She denies cough, dysuria, abdominal pain.  She developed diarrhea overnight.  Patient was found to have high fever 101.4 on admission.  Elevated D-dimer.  CTA negative for PE.  Dopplers lower extremity negative for DVT. Admitted with SIRS criteria unclear etiology.   Assessment & Plan:   Principal Problem:   Sepsis ([place]) Active Problems:   Anxiety   Depression   Essential hypertension   Prediabetes   Hypokalemia   Tachycardia  1-SIRS;  -Presents with fever (6/27 tempeture 101) , tachycardia, tachypnea,  mild elevation of lactic acid. Confusion. -She had a second reading tempeture at 102, that was probably not accurate, because few minutes after recheck was normal.  -Chest x ray negative for PNA. UA; negative for UTI. COVID PCR negative -Urine culture no growth to date. and blood cultures: no growth to date.  -CT abdomen and pelvis: Negative for acute infectious process, hepatic asteatosis -No Nuchal rigidity. Complaints of chronic migraines.  -Vancomycin and flagyl discontinue 6/29. Completed 5 days of Aztreonam.  -ANA positive, Double strand DNA negative,  RF negative. ANCA; pending.  -Will check cryoglobulin, hepatitis panel, complement,  -Spike fever last night. Will consult ID>   2-Acute infarct Right basal Ganglia:  Acute metabolic encephalopathy;  -Husband report patient had confusion episode 2/29. -CT: New multifocal hypoattenuation in the basilar ganglia and thalami bilaterally of indeterminate etiology.   Consideration include multifocal infarcts, toxic metabolic insults, hypoxic ischemic injury and infection.  No mass-effect to suggest tumor. Correlate  with MRI.  -MRI: Right basal ganglia infarct, hyperintensity and mild enlargement of the left head of caudate, left thalamus and a small amount of hyperintensity in the right medial thalamus.  Vasogenic type edema in the deep white matter structures on the left.  Recommend postcontrast MRI to further evaluate. -MRI with Contrast: Mild, largely vascular/perivascular appearing enhancement in the areas of signal abnormality in the deep gray nuclei as well as a small amount of leptomeningeal enhancement over the right and possibly left parietal convexities.  No solid mass enhancing.  Differential Diagnosis: Cryptococcus, vasculitis, or unusual neoplasm and so as angiocentric lymphoma and granulomatosis/autoimmune or other inflammatory condition.  -LDL ;45. Discussed with Neurology no need for statins, LDL below goal.  -Continue with aspirin -No need for permissive HTN./  -On aspirin and plavix.   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 :  -LP elevated protein, no WBC, VDRL CSF pending, HSV SCF pending. Culture: no growth to date. Cytology pending, Oligoclonal bands pending, paraneoplastic encephalitis panel pending.  -EEG; negative for seizure.  -Labs; autoimmune work up: RF negative, , Sjogren's pending , ANCA, HIV non reactive, CRP 6.8 , ESR 37 , ANA positive, Double strand DNA negative. , B 1: 115. B12; 254. Cryoglobulin, hepatitis panel, complement ordered.  -She will need repeat MRI in one month.  -On B 12 supplement.  -Neurology will continue to follow up on patient. I informed them that patient had fever.   Hypokalemia; Kcl times 2 ordered.   HTN;  Continue with Norvasc, hydralazine and metoprolol.//  Tachycardia; -received IV fluids.   Generalized weakness.  IV fluids.  PT , OT  B 12. Low normal , will  start supplement.   Depression;  Continue with Wellbutrin, Effexor.   CKD stage II Stable.   Migraines; Continue with topiromate.    Pulmonary Nodule. Needs to follow up with PCP.  Positive D dimer; Doppler LE negative for DVT> CTA negative for PE>   Nutrition Problem: Inadequate oral intake Etiology: decreased appetite    Signs/Symptoms: per patient/family report, meal completion < 25%    Interventions: Ensure Enlive (each supplement provides 350kcal and 20 grams of protein), Magic cup, MVI  Estimated body mass index is 23.74 kg/m as calculated from the following:   Height as of this encounter: $RemoveBeforeD'5\' 3"'PbbicPRxjhTOHz$  (1.6 m).   Weight as of this encounter: 60.8 kg.   DVT prophylaxis: Lovenox Code Status: Full code Family Communication: Husband who was at bedside Disposition Plan:  Status is: Inpatient  Remains inpatient appropriate because:IV treatments appropriate due to intensity of illness or inability to take PO  Dispo: The patient is from: Home              Anticipated d/c is to:  [facility]IR              Patient currently is medically stable to d/c.   Difficult to place patient No        Consultants:  None  Procedures:  none  Antimicrobials:  Vancomycin Aztreonam.   Subjective:  She is feeling well today. Husband think she is more interactive. She had BM.  She had mild headache yesterday. No headache today.  She report mild cough.    Objective: Vitals:   05/29/21 0300 05/29/21 0409 05/29/21 0739 05/29/21 1149  BP: (!) 135/91 138/88 133/85 123/75  Pulse: (!) 104  87 94  Resp: $Remo'15 16 16 16  'qOtER$ Temp: (!) 101.6 F (38.7 C) 99.3 F (37.4 C) 98.4 F (36.9 C) 98.3 F (36.8 C)  TempSrc: Oral  Oral Oral  SpO2: 97%  93% 94%  Weight:      Height:        Intake/Output Summary (Last 24 hours) at 05/29/2021 1401 Last data filed at 05/29/2021 0076 Gross per 24 hour  Intake 200 ml  Output 1450 ml  Net -1250 ml    Filed Weights   05/24/21 0956  Weight: 60.8 kg     Examination:  General exam: NAD Respiratory system: CTA Cardiovascular system: S 1, S 2 RRR Gastrointestinal system: BS present, Soft,  Central nervous system: alert, oriented, follows command.  Extremities: No edema    Data Reviewed: I have personally reviewed following labs and imaging studies  CBC: Recent Labs  Lab 05/24/21 1005 05/25/21 0155 05/26/21 0236 05/27/21 0106 05/28/21 0211  WBC 8.6 8.1 6.1 7.2 6.6  HGB 11.6* 10.5* 11.1* 10.7* 10.3*  HCT 35.8* 32.9* 34.3* 32.2* 30.6*  MCV 92.0 93.2 91.0 88.7 89.0  PLT 243 199 221 249 226    Basic Metabolic Panel: Recent Labs  Lab 05/24/21 1005 05/24/21 1940 05/25/21 0155 05/26/21 0236 05/27/21 0106 05/28/21 0211  NA 139  --  137 132* 130* 134*  K 2.8*  --  3.9 3.6 3.4* 3.3*  CL 105  --  106 103 102 107  CO2 22  --  24 18* 18* 20*  GLUCOSE 121*  --  101* 94 97 107*  BUN 9  --  5* $R'8 12 9  'Ne$ CREATININE 0.77  --  0.71 0.70 0.74  0.64  CALCIUM 9.3  --  8.7* 8.9 8.4* 8.2*  MG  --  2.3  --   --   --   --     GFR: Estimated Creatinine Clearance: 54.1 mL/min (by C-G formula based on SCr of 0.64 mg/dL). Liver Function Tests: Recent Labs  Lab 05/24/21 1005 05/26/21 0236  AST 24 25  ALT 16 13  ALKPHOS 75 54  BILITOT 0.5 0.6  PROT 7.7 6.4*  ALBUMIN 3.4* 2.9*    No results for input(s): LIPASE, AMYLASE in the last 168 hours. No results for input(s): AMMONIA in the last 168 hours. Coagulation Profile: Recent Labs  Lab 05/24/21 1150  INR 1.1    Cardiac Enzymes: No results for input(s): CKTOTAL, CKMB, CKMBINDEX, TROPONINI in the last 168 hours. BNP (last 3 results) No results for input(s): PROBNP in the last 8760 hours. HbA1C: Recent Labs    05/27/21 0106  HGBA1C 5.9*    CBG: No results for input(s): GLUCAP in the last 168 hours. Lipid Profile: Recent Labs    05/27/21 0106  CHOL 96  HDL 24*  LDLCALC 45  TRIG 136  CHOLHDL 4.0    Thyroid Function Tests: Recent Labs    05/26/21 1603  TSH  1.904    Anemia Panel: Recent Labs    05/26/21 1603  VITAMINB12 254    Sepsis Labs: Recent Labs  Lab 05/24/21 1051 05/24/21 1227 05/24/21 1940  PROCALCITON  --   --  <0.10  LATICACIDVEN 2.1* 1.5  --      Recent Results (from the past 240 hour(s))  Blood culture (routine x 2)     Status: None   Collection Time: 05/24/21 10:51 AM   Specimen: BLOOD  Result Value Ref Range Status   Specimen Description BLOOD RIGHT ANTECUBITAL  Final   Special Requests   Final    BOTTLES DRAWN AEROBIC AND ANAEROBIC Blood Culture adequate volume   Culture   Final    NO GROWTH 5 DAYS Performed at [facility], [place] [address], [place], [place] [address]    Report Status 05/29/2021 FINAL  Final  Resp Panel by RT-PCR (Flu A&B, Covid) Nasopharyngeal Swab     Status: None   Collection Time: 05/24/21 11:14 AM   Specimen: Nasopharyngeal Swab; Nasopharyngeal(NP) swabs in vial transport medium  Result Value Ref Range Status   SARS Coronavirus 2 by RT PCR NEGATIVE NEGATIVE Final    Comment: (NOTE) SARS-CoV-2 target nucleic acids are NOT DETECTED.  The SARS-CoV-2 RNA is generally detectable in upper respiratory specimens during the acute phase of infection. The lowest concentration of SARS-CoV-2 viral copies this assay can detect is 138 copies/mL. A negative result does not preclude SARS-Cov-2 infection and should not be used as the sole basis for treatment or other patient management decisions. A negative result may occur with  improper specimen collection/handling, submission of specimen other than nasopharyngeal swab, presence of viral mutation(s) within the areas targeted by this assay, and inadequate number of viral copies(<138 copies/mL). A negative result must be combined with clinical observations, patient history, and epidemiological information. The expected result is Negative.  Fact Sheet for Patients:  EntrepreneurPulse.com.au  Fact Sheet for Healthcare  Providers:  IncredibleEmployment.be  This test is no t yet approved or cleared by the [place] FDA and  has been authorized for detection and/or diagnosis of SARS-CoV-2 by FDA under an Emergency Use Authorization (EUA). This EUA will remain  in effect (meaning this test can be used) for  the duration of the COVID-19 declaration under Section 564(b)(1) of the Act, 21 U.S.C.section 360bbb-3(b)(1), unless the authorization is terminated  or revoked sooner.       Influenza A by PCR NEGATIVE NEGATIVE Final   Influenza B by PCR NEGATIVE NEGATIVE Final    Comment: (NOTE) The Xpert Xpress SARS-CoV-2/FLU/RSV plus assay is intended as an aid in the diagnosis of influenza from Nasopharyngeal swab specimens and should not be used as a sole basis for treatment. Nasal washings and aspirates are unacceptable for Xpert Xpress SARS-CoV-2/FLU/RSV testing.  Fact Sheet for Patients: EntrepreneurPulse.com.au  Fact Sheet for Healthcare Providers: IncredibleEmployment.be  This test is not yet approved or cleared by the [place] FDA and has been authorized for detection and/or diagnosis of SARS-CoV-2 by FDA under an Emergency Use Authorization (EUA). This EUA will remain in effect (meaning this test can be used) for the duration of the COVID-19 declaration under Section 564(b)(1) of the Act, 21 U.S.C. section 360bbb-3(b)(1), unless the authorization is terminated or revoked.  Performed at [facility], [place] [address] [place], [place] [address]   Blood culture (routine x 2)     Status: None   Collection Time: 05/24/21 12:03 PM   Specimen: BLOOD  Result Value Ref Range Status   Specimen Description BLOOD LEFT ANTECUBITAL  Final   Special Requests   Final    BOTTLES DRAWN AEROBIC AND ANAEROBIC Blood Culture adequate volume   Culture   Final    NO GROWTH 5 DAYS Performed at [facility], [place] [address] [place],  [place] [address]    Report Status 05/29/2021 FINAL  Final  Urine culture     Status: None   Collection Time: 05/24/21 12:48 PM   Specimen: Urine, Clean Catch  Result Value Ref Range Status   Specimen Description URINE, CLEAN CATCH  Final   Special Requests NONE  Final   Culture   Final    NO GROWTH Performed at [facility], [place] [address], [place], [place] [address]    Report Status 05/25/2021 FINAL  Final  CSF culture w Gram Stain     Status: None (Preliminary result)   Collection Time: 2021/06/09  7:14 PM   Specimen: CSF; Cerebrospinal Fluid  Result Value Ref Range Status   Specimen Description CSF  Final   Special Requests NONE  Final   Gram Stain   Final    WBC PRESENT, PREDOMINANTLY MONONUCLEAR NO ORGANISMS SEEN CYTOSPIN SMEAR    Culture   Final    NO GROWTH 2 DAYS Performed at [facility], [place] [address], [place], [place] [address]    Report Status PENDING  Incomplete          Radiology Studies: EEG adult  Result Date: Jun 09, 2021 Lora Havens, MD     2021/06/09  6:42 PM Patient Name: AMISADAI WOODFORD MRN: [id number] Epilepsy Attending: Lora Havens Referring Physician/Provider: Dr Rosalin Hawking Date: 09-Jun-2021 Duration: 24.11 mins Patient history: 70yo F presented with falling at home, generalized weakness, fever, difficult waking with imbalance, lack of appetite. MRI showed hyperintensity and mild enlargement of the left head of caudate. EEG to evaluate for seizure Level of alertness: Awake AEDs during EEG study: TPM Technical aspects: This EEG study was done with scalp electrodes positioned according to the 10-20 International system of electrode placement. Electrical activity was acquired at a sampling rate of $Remov'500Hz'dNCgTx$  and reviewed with a high frequency filter of $RemoveB'70Hz'peiBRxSK$  and a low frequency  filter of $RemoveB'1Hz'vCXTXGvu$ . EEG data were recorded continuously and digitally stored. Description: The posterior dominant rhythm consists of 9 Hz activity of moderate voltage (25-35 uV) seen  predominantly in posterior head regions, symmetric and reactive to eye opening and eye closing. EEG showed intermittent 3 to 6 Hz theta-delta slowing in left temporal  region. Single sharp transient was seen in left temporal region. Hyperventilation and photic stimulation were not performed.   ABNORMALITY - Intermittent slow, generalized IMPRESSION: This study is suggestive of cortical dysfunction arising from left temporal region, nonspecific etiology. No seizures or definite epileptiform discharges were seen throughout the recording. If suspicion for interictal activity remains a concern, a prolonged study including sleep should be considered. Priyanka Barbra Sarks        Scheduled Meds:  amLODipine  5 mg Oral q AM   aspirin EC  81 mg Oral Daily   buPROPion  300 mg Oral Daily   clopidogrel  75 mg Oral Daily   enoxaparin (LOVENOX) injection  40 mg Subcutaneous Q24H   feeding supplement  237 mL Oral BID BM   hydrALAZINE  25 mg Oral BID   metoprolol succinate  100 mg Oral Daily   multivitamin with minerals  1 tablet Oral Daily   potassium chloride  40 mEq Oral BID   topiramate  150 mg Oral QHS   venlafaxine XR  75 mg Oral Q breakfast   vitamin B-12  1,000 mcg Oral Daily   Continuous Infusions:  lactated ringers 75 mL/hr at 05/29/21 0635     LOS: 5 days    Time spent: 35 minutes.     Elmarie Shiley, MD [facility]   If 7PM-7AM, please contact night-coverage [email or web address]  05/29/2021, 2:01 PM

## 2021-05-29 NOTE — Consult Note (Signed)
[facility]    Date of Admission:  05/24/2021   Total days of antibiotics: off                Reason for Consult: FUO    Referring Provider: Regalado   Assessment: FUO Strongly suspect Vasculitis HypOkalemia Pre-Diabetes (A1C 5.9%)    Plan: She's been off anbx for 24h. Would continue to hold. If she has further fever could consider repeat BCx  For now will await her FUO/vasculitis w/u (ANA, ANCA ect).  Check RPR Defer use of steroids to neuro Will follow peripherally unless clinical change  Thank you so much for this interesting consult,  Principal Problem:   Sepsis ([place]) Active Problems:   Anxiety   Depression   Essential hypertension   Prediabetes   Hypokalemia   Tachycardia   Cerebral thrombosis with cerebral infarction    amLODipine  5 mg Oral q AM   aspirin EC  81 mg Oral Daily   buPROPion  300 mg Oral Daily   clopidogrel  75 mg Oral Daily   enoxaparin (LOVENOX) injection  40 mg Subcutaneous Q24H   feeding supplement  237 mL Oral BID BM   hydrALAZINE  25 mg Oral BID   metoprolol succinate  100 mg Oral Daily   multivitamin with minerals  1 tablet Oral Daily   potassium chloride  40 mEq Oral BID   topiramate  150 mg Oral QHS   venlafaxine XR  75 mg Oral Q breakfast   vitamin B-12  1,000 mcg Oral Daily    HPI: Michaela Rios is a 70 y.o. female adm on 6-27 with hx of pre-diabetes, CKD3, adm with loss of balance, polyuria, decreased po intake, and chills. In ED she was found to have temp 101.4.  She was dx with sepsis (lactate 2.1) and vanco/aztreonam.  She has had CTA/abd (-), LE doppler (-), COVID (-), BCx (-), UCx (-).  She was seen by Neuro 6-29 and MRI of brain (without) showing- R basal ganglia stroke, small vessel disease.   MRI with showed Mild, largely vascular/perivascular appearing enhancement in the areas of signal abnormality in the deep gray nuclei as well as a small amount of leptomeningeal enhancement  over the right and possibly left parietal convexities.  No solid mass enhancing.  Differential Diagnosis: Cryptococcus, vasculitis, or unusual neoplasm and so as angiocentric lymphoma and granulomatosis/autoimmune or other inflammatory condition.  ESR 37 CRP 6.8 RF ANCA ANA ANTI-DS DNA HIV (-) Hep A/B/C (-) CSF (6-30) 22 RBC, 2 WBC, prot 82, Crypto (-), Cx ngtd  She had temp 101.6 this AM.  She has no complaints No hx of TB exposure  Review of Systems: Review of Systems  Constitutional:  Positive for fever. Negative for chills.  Eyes:  Positive for blurred vision (not acute, L > R).  Respiratory:  Positive for cough (rare). Negative for shortness of breath.   Cardiovascular:  Negative for chest pain.  Gastrointestinal:  Negative for constipation and diarrhea.  Genitourinary:  Positive for frequency. Negative for dysuria.  Neurological:  Negative for sensory change.   Past Medical History:  Diagnosis Date   Anemia    Anxiety    Arthritis    Cataract    Chronic migraine    follows with neuro for same   Clotting disorder (HCC)    clot in ovarian vein- hx   Esophagitis    GERD (gastroesophageal reflux disease)  Hypertension    IBS (irritable bowel syndrome)    Low back pain syndrome    MRSA (methicillin resistant Staphylococcus aureus) infection 04/17/12   left torso   Peptic ulcer disease 2003, 12/2012   Peripheral vascular disease ([place])    Vitamin D deficiency     Social History   Tobacco Use   Smoking status: Never   Smokeless tobacco: Never  Vaping Use   Vaping Use: Never used  Substance Use Topics   Alcohol use: Yes    Comment: rare   Drug use: No    Family History  Problem Relation Age of Onset   Aneurysm Father    Prostate cancer Brother    Lung cancer Brother    Colon cancer Neg Hx    Esophageal cancer Neg Hx    Rectal cancer Neg Hx    Stomach cancer Neg Hx      Medications: Scheduled:  amLODipine  5 mg Oral q AM   aspirin EC  81 mg Oral  Daily   buPROPion  300 mg Oral Daily   clopidogrel  75 mg Oral Daily   enoxaparin (LOVENOX) injection  40 mg Subcutaneous Q24H   feeding supplement  237 mL Oral BID BM   hydrALAZINE  25 mg Oral BID   metoprolol succinate  100 mg Oral Daily   multivitamin with minerals  1 tablet Oral Daily   potassium chloride  40 mEq Oral BID   topiramate  150 mg Oral QHS   venlafaxine XR  75 mg Oral Q breakfast   vitamin B-12  1,000 mcg Oral Daily    Abtx:  Anti-infectives (From admission, onward)    Start     Dose/Rate Route Frequency Ordered Stop   05/25/21 2100  vancomycin (VANCOREADY) IVPB 1000 mg/200 mL  Status:  Discontinued        1,000 mg 200 mL/hr over 60 Minutes Intravenous Every 24 hours 05/24/21 2029 05/26/21 1546   05/25/21 0500  aztreonam (AZACTAM) injection 2 g  Status:  Discontinued        2 g Intramuscular Every 8 hours 05/24/21 2029 05/24/21 2137   05/25/21 0500  aztreonam (AZACTAM) 2 g in sodium chloride 0.9 % 100 mL IVPB        2 g 200 mL/hr over 30 Minutes Intravenous Every 8 hours 05/24/21 2137 05/28/21 2107   05/24/21 2030  vancomycin (VANCOREADY) IVPB 1250 mg/250 mL        1,250 mg 166.7 mL/hr over 90 Minutes Intravenous  Once 05/24/21 1947 05/25/21 0038   05/24/21 2000  aztreonam (AZACTAM) 2 g in sodium chloride 0.9 % 100 mL IVPB        2 g 200 mL/hr over 30 Minutes Intravenous  Once 05/24/21 1947 05/24/21 2120   05/24/21 2000  metroNIDAZOLE (FLAGYL) IVPB 500 mg  Status:  Discontinued        500 mg 100 mL/hr over 60 Minutes Intravenous Every 8 hours 05/24/21 1947 05/26/21 1546   05/24/21 1100  gentamicin (GARAMYCIN) 300 mg in dextrose 5 % 50 mL IVPB        5 mg/kg  60.8 kg 115 mL/hr over 30 Minutes Intravenous  Once 05/24/21 1047 05/24/21 1210         OBJECTIVE: Blood pressure 133/85, pulse 87, temperature 98.4 F (36.9 C), temperature source Oral, resp. rate 16, height $RemoveBe'5\' 3"'VatOVUxHS$  (1.6 m), weight 60.8 kg, SpO2 93 %.  Physical Exam Vitals reviewed.   Constitutional:      Appearance:  Normal appearance.  HENT:     Mouth/Throat:     Mouth: Mucous membranes are moist.     Pharynx: No oropharyngeal exudate.  Eyes:     Extraocular Movements: Extraocular movements intact.     Pupils: Pupils are equal, round, and reactive to light.  Cardiovascular:     Rate and Rhythm: Normal rate and regular rhythm.  Pulmonary:     Effort: Pulmonary effort is normal.     Breath sounds: Normal breath sounds.  Abdominal:     General: Bowel sounds are normal. There is no distension.     Palpations: Abdomen is soft.  Musculoskeletal:     Cervical back: Normal range of motion and neck supple.     Right lower leg: No edema.     Left lower leg: No edema.  Skin:    General: Skin is warm.  Neurological:     General: No focal deficit present.     Mental Status: She is alert.     Sensory: No sensory deficit.  Psychiatric:        Mood and Affect: Mood normal.    Lab Results Results for orders placed or performed during the hospital encounter of 05/24/21 (from the past 48 hour(s))  Rheumatoid factor     Status: None   Collection Time: 05/27/21 11:24 AM  Result Value Ref Range   Rhuematoid fact SerPl-aCnc <10.0 <14.0 IU/mL    Comment: (NOTE) Performed At: [facility] [place], [place] [id number] Rush Farmer MD RA:[id number]   Sedimentation rate     Status: Abnormal   Collection Time: 05/27/21 11:24 AM  Result Value Ref Range   Sed Rate 37 (H) 0 - 22 mm/hr    Comment: Performed at [facility] [address], [place], [place] [address]  C-reactive protein     Status: Abnormal   Collection Time: 05/27/21 11:24 AM  Result Value Ref Range   CRP 6.8 (H) <1.0 mg/dL    Comment: Performed at [facility] [address] [place], [place] [address]  CSF cell count with differential collection tube #: 1     Status: Abnormal   Collection Time: 05/27/21  7:14 PM  Result Value Ref Range   Tube # 1    Color, CSF  COLORLESS COLORLESS   Appearance, CSF CLEAR (A) CLEAR   Supernatant NOT INDICATED    RBC Count, CSF 26 (H) 0 /cu mm   WBC, CSF 3 0 - 5 /cu mm   Other Cells, CSF TOO FEW TO COUNT, SMEAR AVAILABLE FOR REVIEW     Comment:  FEW LYMPHS, FEW MONOCYTES, RARE SEGS Performed at [facility] [address], [place], [place] [address]   CSF cell count with differential collection tube #: 4     Status: Abnormal   Collection Time: 05/27/21  7:14 PM  Result Value Ref Range   Tube # 4    Color, CSF COLORLESS COLORLESS   Appearance, CSF CLEAR (A) CLEAR   Supernatant NOT INDICATED    RBC Count, CSF 22 (H) 0 /cu mm   WBC, CSF 2 0 - 5 /cu mm   Other Cells, CSF TOO FEW TO COUNT, SMEAR AVAILABLE FOR REVIEW     Comment:  FEW LYMPHS, RARE MONOCYTES, RARE SEGS Performed at [facility] [address] [place], [place] [address]   CSF culture w Gram Stain     Status: None (Preliminary result)   Collection  Time: 05/27/21  7:14 PM   Specimen: CSF; Cerebrospinal Fluid  Result Value Ref Range   Specimen Description CSF    Special Requests NONE    Gram Stain      WBC PRESENT, PREDOMINANTLY MONONUCLEAR NO ORGANISMS SEEN CYTOSPIN SMEAR    Culture      NO GROWTH 2 DAYS Performed at [facility] [address] [place], [place] [address]    Report Status PENDING   Protein and glucose, CSF     Status: Abnormal   Collection Time: 05/27/21  7:14 PM  Result Value Ref Range   Glucose, CSF 52 40 - 70 mg/dL   Total  Protein, CSF 82 (H) 15 - 45 mg/dL    Comment: Performed at [facility] [address], [place], [place] [address]  Cryptococcal antigen, CSF     Status: None   Collection Time: 05/27/21  7:14 PM  Result Value Ref Range   Crypto Ag NEGATIVE NEGATIVE   Cryptococcal Ag Titer NOT INDICATED NOT INDICATED    Comment: Performed at [facility], [place] [address], [place], [place] [address]  CBC     Status: Abnormal   Collection Time: 05/28/21  2:11 AM  Result Value Ref  Range   WBC 6.6 4.0 - 10.5 K/uL   RBC 3.44 (L) 3.87 - 5.11 MIL/uL   Hemoglobin 10.3 (L) 12.0 - 15.0 g/dL   HCT 30.6 (L) 36.0 - 46.0 %   MCV 89.0 80.0 - 100.0 fL   MCH 29.9 26.0 - 34.0 pg   MCHC 33.7 30.0 - 36.0 g/dL   RDW 15.0 11.5 - 15.5 %   Platelets 231 150 - 400 K/uL   nRBC 0.0 0.0 - 0.2 %    Comment: Performed at [facility], [place] [address] [place], [place] [address]  Basic metabolic panel     Status: Abnormal   Collection Time: 05/28/21  2:11 AM  Result Value Ref Range   Sodium 134 (L) 135 - 145 mmol/L   Potassium 3.3 (L) 3.5 - 5.1 mmol/L   Chloride 107 98 - 111 mmol/L   CO2 20 (L) 22 - 32 mmol/L   Glucose, Bld 107 (H) 70 - 99 mg/dL    Comment: Glucose reference range applies only to samples taken after fasting for at least 8 hours.   BUN 9 8 - 23 mg/dL   Creatinine, Ser 0.64 0.44 - 1.00 mg/dL   Calcium 8.2 (L) 8.9 - 10.3 mg/dL   GFR, Estimated >60 >60 mL/min    Comment: (NOTE) Calculated using the CKD-EPI Creatinine Equation (2021)    Anion gap 7 5 - 15    Comment: Performed at [facility] [address] [place], [place] [address]      Component Value Date/Time   SDES CSF 05/27/2021 1914   SPECREQUEST NONE 05/27/2021 1914   CULT  05/27/2021 1914    NO GROWTH 2 DAYS Performed at [facility], [place] [address] [place], [place] [address]    REPTSTATUS PENDING 05/27/2021 1914   EEG adult  Result Date: 05/27/2021 Lora Havens, MD     05/27/2021  6:42 PM Patient Name: Michaela Rios MRN: [id number] Epilepsy Attending: Lora Havens Referring Physician/Provider: Dr Rosalin Hawking Date: 05/27/2021 Duration: 24.11 mins Patient history: 70yo F presented with falling at home, generalized weakness, fever, difficult waking with imbalance, lack of appetite. MRI showed hyperintensity and mild enlargement of the left head  of caudate. EEG to evaluate for seizure Level of alertness: Awake AEDs during EEG study: TPM Technical aspects: This EEG study was done  with scalp electrodes positioned according to the 10-20 International system of electrode placement. Electrical activity was acquired at a sampling rate of $Remov'500Hz'TbZaok$  and reviewed with a high frequency filter of $RemoveB'70Hz'yBJvIbxz$  and a low frequency filter of $RemoveB'1Hz'yPHZfvFK$ . EEG data were recorded continuously and digitally stored. Description: The posterior dominant rhythm consists of 9 Hz activity of moderate voltage (25-35 uV) seen predominantly in posterior head regions, symmetric and reactive to eye opening and eye closing. EEG showed intermittent 3 to 6 Hz theta-delta slowing in left temporal  region. Single sharp transient was seen in left temporal region. Hyperventilation and photic stimulation were not performed.   ABNORMALITY - Intermittent slow, generalized IMPRESSION: This study is suggestive of cortical dysfunction arising from left temporal region, nonspecific etiology. No seizures or definite epileptiform discharges were seen throughout the recording. If suspicion for interictal activity remains a concern, a prolonged study including sleep should be considered. Lora Havens   Recent Results (from the past 240 hour(s))  Blood culture (routine x 2)     Status: None   Collection Time: 05/24/21 10:51 AM   Specimen: BLOOD  Result Value Ref Range Status   Specimen Description BLOOD RIGHT ANTECUBITAL  Final   Special Requests   Final    BOTTLES DRAWN AEROBIC AND ANAEROBIC Blood Culture adequate volume   Culture   Final    NO GROWTH 5 DAYS Performed at [facility], [address] [place], [place] [address]    Report Status 05/29/2021 FINAL  Final  Resp Panel by RT-PCR (Flu A&B, Covid) Nasopharyngeal Swab     Status: None   Collection Time: 05/24/21 11:14 AM   Specimen: Nasopharyngeal Swab; Nasopharyngeal(NP) swabs in vial transport medium  Result Value Ref Range Status   SARS Coronavirus 2 by RT PCR NEGATIVE NEGATIVE Final    Comment: (NOTE) SARS-CoV-2 target nucleic acids are NOT DETECTED.  The  SARS-CoV-2 RNA is generally detectable in upper respiratory specimens during the acute phase of infection. The lowest concentration of SARS-CoV-2 viral copies this assay can detect is 138 copies/mL. A negative result does not preclude SARS-Cov-2 infection and should not be used as the sole basis for treatment or other patient management decisions. A negative result may occur with  improper specimen collection/handling, submission of specimen other than nasopharyngeal swab, presence of viral mutation(s) within the areas targeted by this assay, and inadequate number of viral copies(<138 copies/mL). A negative result must be combined with clinical observations, patient history, and epidemiological information. The expected result is Negative.  Fact Sheet for Patients:  EntrepreneurPulse.com.au  Fact Sheet for Healthcare Providers:  IncredibleEmployment.be  This test is no t yet approved or cleared by the [place] FDA and  has been authorized for detection and/or diagnosis of SARS-CoV-2 by FDA under an Emergency Use Authorization (EUA). This EUA will remain  in effect (meaning this test can be used) for the duration of the COVID-19 declaration under Section 564(b)(1) of the Act, 21 U.S.C.section 360bbb-3(b)(1), unless the authorization is terminated  or revoked sooner.       Influenza A by PCR NEGATIVE NEGATIVE Final   Influenza B by PCR NEGATIVE NEGATIVE Final    Comment: (NOTE) The Xpert Xpress SARS-CoV-2/FLU/RSV plus assay is intended as an aid in the diagnosis of influenza from Nasopharyngeal swab specimens and should not be used as a sole basis  for treatment. Nasal washings and aspirates are unacceptable for Xpert Xpress SARS-CoV-2/FLU/RSV testing.  Fact Sheet for Patients: EntrepreneurPulse.com.au  Fact Sheet for Healthcare Providers: IncredibleEmployment.be  This test is not yet approved or  cleared by the [place] FDA and has been authorized for detection and/or diagnosis of SARS-CoV-2 by FDA under an Emergency Use Authorization (EUA). This EUA will remain in effect (meaning this test can be used) for the duration of the COVID-19 declaration under Section 564(b)(1) of the Act, 21 U.S.C. section 360bbb-3(b)(1), unless the authorization is terminated or revoked.  Performed at [facility], [place] [address] [place], [place] [address]   Blood culture (routine x 2)     Status: None   Collection Time: 05/24/21 12:03 PM   Specimen: BLOOD  Result Value Ref Range Status   Specimen Description BLOOD LEFT ANTECUBITAL  Final   Special Requests   Final    BOTTLES DRAWN AEROBIC AND ANAEROBIC Blood Culture adequate volume   Culture   Final    NO GROWTH 5 DAYS Performed at [facility], [place] [address], [place], [place] [address]    Report Status 05/29/2021 FINAL  Final  Urine culture     Status: None   Collection Time: 05/24/21 12:48 PM   Specimen: Urine, Clean Catch  Result Value Ref Range Status   Specimen Description URINE, CLEAN CATCH  Final   Special Requests NONE  Final   Culture   Final    NO GROWTH Performed at [facility], [place] [address] [place], [place] [address]    Report Status 05/25/2021 FINAL  Final  CSF culture w Gram Stain     Status: None (Preliminary result)   Collection Time: 05/27/21  7:14 PM   Specimen: CSF; Cerebrospinal Fluid  Result Value Ref Range Status   Specimen Description CSF  Final   Special Requests NONE  Final   Gram Stain   Final    WBC PRESENT, PREDOMINANTLY MONONUCLEAR NO ORGANISMS SEEN CYTOSPIN SMEAR    Culture   Final    NO GROWTH 2 DAYS Performed at [facility], [place] [address] [place], [place] [address]    Report Status PENDING  Incomplete    Microbiology: Recent Results (from the past 240 hour(s))  Blood culture (routine x 2)     Status: None   Collection Time: 05/24/21 10:51 AM   Specimen:  BLOOD  Result Value Ref Range Status   Specimen Description BLOOD RIGHT ANTECUBITAL  Final   Special Requests   Final    BOTTLES DRAWN AEROBIC AND ANAEROBIC Blood Culture adequate volume   Culture   Final    NO GROWTH 5 DAYS Performed at [facility], [address], [place], [place] [address]    Report Status 05/29/2021 FINAL  Final  Resp Panel by RT-PCR (Flu A&B, Covid) Nasopharyngeal Swab     Status: None   Collection Time: 05/24/21 11:14 AM   Specimen: Nasopharyngeal Swab; Nasopharyngeal(NP) swabs in vial transport medium  Result Value Ref Range Status   SARS Coronavirus 2 by RT PCR NEGATIVE NEGATIVE Final    Comment: (NOTE) SARS-CoV-2 target nucleic acids are NOT DETECTED.  The SARS-CoV-2 RNA is generally detectable in upper respiratory specimens during the acute phase of infection. The lowest concentration of SARS-CoV-2 viral copies this assay can detect is 138 copies/mL. A negative result does not preclude SARS-Cov-2 infection and should not be used as the sole basis for treatment or other  patient management decisions. A negative result may occur with  improper specimen collection/handling, submission of specimen other than nasopharyngeal swab, presence of viral mutation(s) within the areas targeted by this assay, and inadequate number of viral copies(<138 copies/mL). A negative result must be combined with clinical observations, patient history, and epidemiological information. The expected result is Negative.  Fact Sheet for Patients:  EntrepreneurPulse.com.au  Fact Sheet for Healthcare Providers:  IncredibleEmployment.be  This test is no t yet approved or cleared by the [place] FDA and  has been authorized for detection and/or diagnosis of SARS-CoV-2 by FDA under an Emergency Use Authorization (EUA). This EUA will remain  in effect (meaning this test can be used) for the duration of the COVID-19 declaration under  Section 564(b)(1) of the Act, 21 U.S.C.section 360bbb-3(b)(1), unless the authorization is terminated  or revoked sooner.       Influenza A by PCR NEGATIVE NEGATIVE Final   Influenza B by PCR NEGATIVE NEGATIVE Final    Comment: (NOTE) The Xpert Xpress SARS-CoV-2/FLU/RSV plus assay is intended as an aid in the diagnosis of influenza from Nasopharyngeal swab specimens and should not be used as a sole basis for treatment. Nasal washings and aspirates are unacceptable for Xpert Xpress SARS-CoV-2/FLU/RSV testing.  Fact Sheet for Patients: EntrepreneurPulse.com.au  Fact Sheet for Healthcare Providers: IncredibleEmployment.be  This test is not yet approved or cleared by the [place] FDA and has been authorized for detection and/or diagnosis of SARS-CoV-2 by FDA under an Emergency Use Authorization (EUA). This EUA will remain in effect (meaning this test can be used) for the duration of the COVID-19 declaration under Section 564(b)(1) of the Act, 21 U.S.C. section 360bbb-3(b)(1), unless the authorization is terminated or revoked.  Performed at [facility], [place] [address] [place], [place] [address]   Blood culture (routine x 2)     Status: None   Collection Time: 05/24/21 12:03 PM   Specimen: BLOOD  Result Value Ref Range Status   Specimen Description BLOOD LEFT ANTECUBITAL  Final   Special Requests   Final    BOTTLES DRAWN AEROBIC AND ANAEROBIC Blood Culture adequate volume   Culture   Final    NO GROWTH 5 DAYS Performed at [facility], [place] [address], [place], [place] [address]    Report Status 05/29/2021 FINAL  Final  Urine culture     Status: None   Collection Time: 05/24/21 12:48 PM   Specimen: Urine, Clean Catch  Result Value Ref Range Status   Specimen Description URINE, CLEAN CATCH  Final   Special Requests NONE  Final   Culture   Final    NO GROWTH Performed at [facility], [place] [address]  [place], [place] [address]    Report Status 05/25/2021 FINAL  Final  CSF culture w Gram Stain     Status: None (Preliminary result)   Collection Time: 05/27/21  7:14 PM   Specimen: CSF; Cerebrospinal Fluid  Result Value Ref Range Status   Specimen Description CSF  Final   Special Requests NONE  Final   Gram Stain   Final    WBC PRESENT, PREDOMINANTLY MONONUCLEAR NO ORGANISMS SEEN CYTOSPIN SMEAR    Culture   Final    NO GROWTH 2 DAYS Performed at [facility], [place] [address], [place], [place] [address]    Report Status PENDING  Incomplete    Radiographs and labs were personally reviewed by me.   Bobby Rumpf, MD [facility]  [facility] [phone] 05/29/2021, 11:08 AM

## 2021-05-30 DIAGNOSIS — I6330 Cerebral infarction due to thrombosis of unspecified cerebral artery: Secondary | ICD-10-CM

## 2021-05-30 LAB — BASIC METABOLIC PANEL
Anion gap: 5 (ref 5–15)
BUN: 11 mg/dL (ref 8–23)
CO2: 21 mmol/L — ABNORMAL LOW (ref 22–32)
Calcium: 8.7 mg/dL — ABNORMAL LOW (ref 8.9–10.3)
Chloride: 109 mmol/L (ref 98–111)
Creatinine, Ser: 0.66 mg/dL (ref 0.44–1.00)
GFR, Estimated: >60 mL/min (ref >60)
Glucose, Bld: 104 mg/dL — ABNORMAL HIGH (ref 70–99)
Potassium: 3.9 mmol/L (ref 3.5–5.1)
Sodium: 135 mmol/L (ref 135–145)

## 2021-05-30 LAB — CBC
HCT: 31.6 % — ABNORMAL LOW (ref 36.0–46.0)
Hemoglobin: 10.4 g/dL — ABNORMAL LOW (ref 12.0–15.0)
MCH: 29.5 pg (ref 26.0–34.0)
MCHC: 32.9 g/dL (ref 30.0–36.0)
MCV: 89.5 fL (ref 80.0–100.0)
Platelets: 284 10*3/uL (ref 150–400)
RBC: 3.53 MIL/uL — ABNORMAL LOW (ref 3.87–5.11)
RDW: 15.4 % (ref 11.5–15.5)
WBC: 6.1 10*3/uL (ref 4.0–10.5)
nRBC: 0 % (ref 0.0–0.2)

## 2021-05-30 LAB — RPR: RPR Ser Ql: NONREACTIVE

## 2021-05-30 MED ORDER — SODIUM CHLORIDE 0.9 % IV SOLN
1000.0000 mg | Freq: Every day | INTRAVENOUS | Status: AC
  Administered 2021-05-30 – 2021-06-01 (×3): 1000 mg via INTRAVENOUS
  Filled 2021-05-30 (×3): qty 8

## 2021-05-30 MED ORDER — CALCIUM CARBONATE 1250 (500 CA) MG PO TABS
1.0000 | ORAL_TABLET | Freq: Every day | ORAL | Status: DC
  Administered 2021-05-31 – 2021-06-08 (×8): 500 mg via ORAL
  Filled 2021-05-30 (×9): qty 1

## 2021-05-30 MED ORDER — PANTOPRAZOLE SODIUM 40 MG PO TBEC
40.0000 mg | DELAYED_RELEASE_TABLET | Freq: Every day | ORAL | Status: DC
  Administered 2021-05-30 – 2021-06-08 (×10): 40 mg via ORAL
  Filled 2021-05-30 (×10): qty 1

## 2021-05-30 MED ORDER — VITAMIN D 25 MCG (1000 UNIT) PO TABS
1000.0000 [IU] | ORAL_TABLET | Freq: Every day | ORAL | Status: DC
  Administered 2021-06-01 – 2021-06-08 (×8): 1000 [IU] via ORAL
  Filled 2021-05-30 (×9): qty 1

## 2021-05-30 NOTE — Plan of Care (Signed)
  Problem: Education: Goal: Knowledge of General Education information will improve Description: Including pain rating scale, medication(s)/side effects and non-pharmacologic comfort measures Outcome: Progressing   Problem: Health Behavior/Discharge Planning: Goal: Ability to manage health-related needs will improve Outcome: Progressing   Problem: Clinical Measurements: Goal: Ability to maintain clinical measurements within normal limits will improve Outcome: Progressing Goal: Will remain free from infection Outcome: Progressing Goal: Diagnostic test results will improve Outcome: Progressing Goal: Respiratory complications will improve Outcome: Progressing Goal: Cardiovascular complication will be avoided Outcome: Progressing   Problem: Activity: Goal: Risk for activity intolerance will decrease Outcome: Progressing   Problem: Nutrition: Goal: Adequate nutrition will be maintained Outcome: Progressing   Problem: Coping: Goal: Level of anxiety will decrease Outcome: Progressing   Problem: Elimination: Goal: Will not experience complications related to bowel motility Outcome: Progressing Goal: Will not experience complications related to urinary retention Outcome: Progressing   Problem: Pain Managment: Goal: General experience of comfort will improve Outcome: Progressing   Problem: Safety: Goal: Ability to remain free from injury will improve Outcome: Progressing   Problem: Skin Integrity: Goal: Risk for impaired skin integrity will decrease Outcome: Progressing   Problem: Fluid Volume: Goal: Hemodynamic stability will improve Outcome: Progressing   Problem: Clinical Measurements: Goal: Diagnostic test results will improve Outcome: Progressing Goal: Signs and symptoms of infection will decrease Outcome: Progressing   Problem: Respiratory: Goal: Ability to maintain adequate ventilation will improve Outcome: Progressing   Problem: Education: Goal:  Knowledge of disease or condition will improve Outcome: Progressing Goal: Knowledge of secondary prevention will improve Outcome: Progressing Goal: Knowledge of patient specific risk factors addressed and post discharge goals established will improve Outcome: Progressing   Problem: Coping: Goal: Will verbalize positive feelings about self Outcome: Progressing Goal: Will identify appropriate support needs Outcome: Progressing   Problem: Health Behavior/Discharge Planning: Goal: Ability to manage health-related needs will improve Outcome: Progressing   Problem: Self-Care: Goal: Ability to participate in self-care as condition permits will improve Outcome: Progressing Goal: Verbalization of feelings and concerns over difficulty with self-care will improve Outcome: Progressing Goal: Ability to communicate needs accurately will improve Outcome: Progressing   Problem: Nutrition: Goal: Risk of aspiration will decrease Outcome: Progressing   Problem: Ischemic Stroke/TIA Tissue Perfusion: Goal: Complications of ischemic stroke/TIA will be minimized Outcome: Progressing

## 2021-05-30 NOTE — Progress Notes (Signed)
PROGRESS NOTE    Michaela Rios  FVW:[id number] DOB: 08/22/1951 DOA: 05/24/2021 PCP: Binnie Rail, MD   Brief Narrative: 70 year old with past medical history significant for hypertension, prediabetes, depression, CKD stage II, history of PUD, chronic low back pain, migraine who presented to the ED with generalized weakness and loss of balance.  Patient fell on Sunday while trying to go to the bathroom and lost her balance.  She has been having shivering.  She denies cough, dysuria, abdominal pain.  She developed diarrhea overnight.  Patient was found to have high fever 101.4 on admission.  Elevated D-dimer.  CTA negative for PE.  Dopplers lower extremity negative for DVT. Admitted with SIRS criteria unclear etiology.   Assessment & Plan:   Principal Problem:   Sepsis ([place]) Active Problems:   Anxiety   Depression   Essential hypertension   Prediabetes   Hypokalemia   Tachycardia  1-SIRS:  -Presents with fever (6/27 tempeture 101) , tachycardia, tachypnea,  mild elevation of lactic acid. Confusion.  -Chest x ray negative for PNA. UA; negative for UTI. COVID PCR negative -Urine culture no growth to date. and blood cultures: no growth to date.  -CT abdomen and pelvis: Negative for acute infectious process, hepatic asteatosis -No Nuchal rigidity. Complaints of chronic migraines.  -Vancomycin and flagyl discontinue 6/29. Completed 5 days of Aztreonam.  -Vasculitis work up: ANA positive, Double strand DNA negative,  RF negative. ANCA; pending. Cryoglobulin pending., hepatitis panel negative, complement  pending. Sjogren's pending. , ESR 37, CRP; 6.8. -ID consulted due to persistent fever. ID recommend hold on IV antibiotics. If recurrent fever repeat Blood culture. I placed ordered for repeat blood cultures 7/03. RPR negative. They recommend follow up vasculitis work up.  -Patient does not have any systemic Vasculitis symptoms; No pulmonary affection, no joint pain , no abdominal  pain.   2-Acute infarct Right basal Ganglia:  Acute metabolic encephalopathy;  -Husband report patient had confusion episode 2/29. -CT: New multifocal hypoattenuation in the basilar ganglia and thalami bilaterally of indeterminate etiology.  Consideration include multifocal infarcts, toxic metabolic insults, hypoxic ischemic injury and infection.  No mass-effect to suggest tumor. Correlate  with MRI.  -MRI: Right basal ganglia infarct, hyperintensity and mild enlargement of the left head of caudate, left thalamus and a small amount of hyperintensity in the right medial thalamus.  Vasogenic type edema in the deep white matter structures on the left.  Recommend postcontrast MRI to further evaluate. -MRI with Contrast: Mild, largely vascular/perivascular appearing enhancement in the areas of signal abnormality in the deep gray nuclei as well as a small amount of leptomeningeal enhancement over the right and possibly left parietal convexities.  No solid mass enhancing.  Differential Diagnosis: Cryptococcus, vasculitis, or unusual neoplasm and so as angiocentric lymphoma and granulomatosis/autoimmune or other inflammatory condition.  -LDL ;45. Discussed with Neurology no need for statins, LDL below goal.  -Continue with aspirin -On aspirin and plavix.  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 :  -LP elevated protein, no WBC, VDRL CSF pending, HSV SCF pending. Culture: no growth to date. Cytology pending, Oligoclonal bands pending, paraneoplastic encephalitis panel pending.  -EEG; negative for seizure.  -Labs; autoimmune work up: RF negative, , Sjogren's pending , ANCA, HIV non reactive, CRP 6.8 , ESR 37 , ANA positive, Double strand DNA negative. , B 1: 115. B12; 254. VDRL SCF Pending, Oligoclonal CSF pending.  Cryoglobulin, hepatitis panel negative,  complement ordered.  -She will need  repeat MRI in one month.  -On B 12 supplement.  -Patient had fever  this am, she is feeling more sleepy today, report mild headaches. I discussed with Dr Rory Percy, neurologist , he will evaluate patient today and will provide further recommendations.   Hypokalemia; replaced.   HTN;  Continue with Norvasc, hydralazine and metoprolol.//  Tachycardia; -received IV fluids.   Generalized weakness.  IV fluids.  PT , OT  B 12. Low normal, on supplement.   Depression;  Continue with Wellbutrin, Effexor.   CKD stage II Stable.   Migraines; Continue with topiromate.    Pulmonary Nodule. Needs to follow up with PCP.  Positive D dimer; Doppler LE negative for DVT> CTA negative for PE>   Nutrition Problem: Inadequate oral intake Etiology: decreased appetite    Signs/Symptoms: per patient/family report, meal completion < 25%    Interventions: Ensure Enlive (each supplement provides 350kcal and 20 grams of protein), Magic cup, MVI  Estimated body mass index is 23.74 kg/m as calculated from the following:   Height as of this encounter: $RemoveBeforeD'5\' 3"'yjTuYzpcGZeBbw$  (1.6 m).   Weight as of this encounter: 60.8 kg.   DVT prophylaxis: Lovenox Code Status: Full code Family Communication: Husband who was at bedside Disposition Plan:  Status is: Inpatient  Remains inpatient appropriate because:IV treatments appropriate due to intensity of illness or inability to take PO  Dispo: The patient is from: Home              Anticipated d/c is to:  CIR              Patient currently is medically stable to d/c.   Difficult to place patient No        Consultants:  None  Procedures:  none  Antimicrobials:  Vancomycin Aztreonam.   Subjective:  She had good day yesterday, was sitting up most of day. Work with PT>  She is feeling sleepy today. She fell asleep while talking to me. She wake up and answer questions. Report mild headache.     Objective: Vitals:   05/30/21 0531 05/30/21 0716 05/30/21 0931 05/30/21 1331  BP: (!) 149/95  129/82 (!) 133/93  Pulse: (!) 112    83  Resp: $Remo'16  16 17  'Bkfta$ Temp: (!) 101.3 F (38.5 C) 99.2 F (37.3 C) 98.2 F (36.8 C) 98 F (36.7 C)  TempSrc: Oral Oral Oral Oral  SpO2: 98%     Weight:      Height:        Intake/Output Summary (Last 24 hours) at 05/30/2021 1528 Last data filed at 05/30/2021 1424 Gross per 24 hour  Intake 3502.02 ml  Output 700 ml  Net 2802.02 ml    Filed Weights   05/24/21 0956  Weight: 60.8 kg    Examination:  General exam: NAD Respiratory system: CTA Cardiovascular system: S 1, S 2 RRR Gastrointestinal system: BS present, soft, nt Central nervous system: Alert, more sleepy today. Answer questions. Neuro exam stable.  Extremities: No edema    Data Reviewed: I have personally reviewed following labs and imaging studies  CBC: Recent Labs  Lab 05/25/21 0155 05/26/21 0236 05/27/21 0106 05/28/21 0211 05/30/21 1334  WBC 8.1 6.1 7.2 6.6 6.1  HGB 10.5* 11.1* 10.7* 10.3* 10.4*  HCT 32.9* 34.3* 32.2* 30.6* 31.6*  MCV 93.2 91.0 88.7 89.0 89.5  PLT 199 221 249 231 761    Basic Metabolic Panel: Recent Labs  Lab 05/24/21 1940 05/25/21 0155 05/26/21 0236 05/27/21 0106 05/28/21 0211 05/30/21  1334  NA  --  137 132* 130* 134* 135  K  --  3.9 3.6 3.4* 3.3* 3.9  CL  --  106 103 102 107 109  CO2  --  24 18* 18* 20* 21*  GLUCOSE  --  101* 94 97 107* 104*  BUN  --  5* $R'8 12 9 11  'Es$ CREATININE  --  0.71 0.70 0.74 0.64 0.66  CALCIUM  --  8.7* 8.9 8.4* 8.2* 8.7*  MG 2.3  --   --   --   --   --     GFR: Estimated Creatinine Clearance: 54.1 mL/min (by C-G formula based on SCr of 0.66 mg/dL). Liver Function Tests: Recent Labs  Lab 05/24/21 1005 05/26/21 0236  AST 24 25  ALT 16 13  ALKPHOS 75 54  BILITOT 0.5 0.6  PROT 7.7 6.4*  ALBUMIN 3.4* 2.9*    No results for input(s): LIPASE, AMYLASE in the last 168 hours. No results for input(s): AMMONIA in the last 168 hours. Coagulation Profile: Recent Labs  Lab 05/24/21 1150  INR 1.1    Cardiac Enzymes: No results for input(s):  CKTOTAL, CKMB, CKMBINDEX, TROPONINI in the last 168 hours. BNP (last 3 results) No results for input(s): PROBNP in the last 8760 hours. HbA1C: No results for input(s): HGBA1C in the last 72 hours.  CBG: No results for input(s): GLUCAP in the last 168 hours. Lipid Profile: No results for input(s): CHOL, HDL, LDLCALC, TRIG, CHOLHDL, LDLDIRECT in the last 72 hours.  Thyroid Function Tests: No results for input(s): TSH, T4TOTAL, FREET4, T3FREE, THYROIDAB in the last 72 hours.  Anemia Panel: No results for input(s): VITAMINB12, FOLATE, FERRITIN, TIBC, IRON, RETICCTPCT in the last 72 hours.  Sepsis Labs: Recent Labs  Lab 05/24/21 1051 05/24/21 1227 05/24/21 1940  PROCALCITON  --   --  <0.10  LATICACIDVEN 2.1* 1.5  --      Recent Results (from the past 240 hour(s))  Blood culture (routine x 2)     Status: None   Collection Time: 05/24/21 10:51 AM   Specimen: BLOOD  Result Value Ref Range Status   Specimen Description BLOOD RIGHT ANTECUBITAL  Final   Special Requests   Final    BOTTLES DRAWN AEROBIC AND ANAEROBIC Blood Culture adequate volume   Culture   Final    NO GROWTH 5 DAYS Performed at [facility], [address] [place], [place] [address]    Report Status 05/29/2021 FINAL  Final  Resp Panel by RT-PCR (Flu A&B, Covid) Nasopharyngeal Swab     Status: None   Collection Time: 05/24/21 11:14 AM   Specimen: Nasopharyngeal Swab; Nasopharyngeal(NP) swabs in vial transport medium  Result Value Ref Range Status   SARS Coronavirus 2 by RT PCR NEGATIVE NEGATIVE Final    Comment: (NOTE) SARS-CoV-2 target nucleic acids are NOT DETECTED.  The SARS-CoV-2 RNA is generally detectable in upper respiratory specimens during the acute phase of infection. The lowest concentration of SARS-CoV-2 viral copies this assay can detect is 138 copies/mL. A negative result does not preclude SARS-Cov-2 infection and should not be used as the sole basis for treatment or other patient  management decisions. A negative result may occur with  improper specimen collection/handling, submission of specimen other than nasopharyngeal swab, presence of viral mutation(s) within the areas targeted by this assay, and inadequate number of viral copies(<138 copies/mL). A negative result must be combined with clinical observations, patient history, and epidemiological information. The expected result is  Negative.  Fact Sheet for Patients:  EntrepreneurPulse.com.au  Fact Sheet for Healthcare Providers:  IncredibleEmployment.be  This test is no t yet approved or cleared by the [place] FDA and  has been authorized for detection and/or diagnosis of SARS-CoV-2 by FDA under an Emergency Use Authorization (EUA). This EUA will remain  in effect (meaning this test can be used) for the duration of the COVID-19 declaration under Section 564(b)(1) of the Act, 21 U.S.C.section 360bbb-3(b)(1), unless the authorization is terminated  or revoked sooner.       Influenza A by PCR NEGATIVE NEGATIVE Final   Influenza B by PCR NEGATIVE NEGATIVE Final    Comment: (NOTE) The Xpert Xpress SARS-CoV-2/FLU/RSV plus assay is intended as an aid in the diagnosis of influenza from Nasopharyngeal swab specimens and should not be used as a sole basis for treatment. Nasal washings and aspirates are unacceptable for Xpert Xpress SARS-CoV-2/FLU/RSV testing.  Fact Sheet for Patients: EntrepreneurPulse.com.au  Fact Sheet for Healthcare Providers: IncredibleEmployment.be  This test is not yet approved or cleared by the [place] FDA and has been authorized for detection and/or diagnosis of SARS-CoV-2 by FDA under an Emergency Use Authorization (EUA). This EUA will remain in effect (meaning this test can be used) for the duration of the COVID-19 declaration under Section 564(b)(1) of the Act, 21 U.S.C. section 360bbb-3(b)(1),  unless the authorization is terminated or revoked.  Performed at [facility], [place] [address] [place], [place] [address]   Blood culture (routine x 2)     Status: None   Collection Time: 05/24/21 12:03 PM   Specimen: BLOOD  Result Value Ref Range Status   Specimen Description BLOOD LEFT ANTECUBITAL  Final   Special Requests   Final    BOTTLES DRAWN AEROBIC AND ANAEROBIC Blood Culture adequate volume   Culture   Final    NO GROWTH 5 DAYS Performed at [facility], [place] [address], [place], [place] [address]    Report Status 05/29/2021 FINAL  Final  Urine culture     Status: None   Collection Time: 05/24/21 12:48 PM   Specimen: Urine, Clean Catch  Result Value Ref Range Status   Specimen Description URINE, CLEAN CATCH  Final   Special Requests NONE  Final   Culture   Final    NO GROWTH Performed at [facility], [place] [address], [place], [place] [address]    Report Status 05/25/2021 FINAL  Final  CSF culture w Gram Stain     Status: None (Preliminary result)   Collection Time: 05/27/21  7:14 PM   Specimen: CSF; Cerebrospinal Fluid  Result Value Ref Range Status   Specimen Description CSF  Final   Special Requests NONE  Final   Gram Stain   Final    WBC PRESENT, PREDOMINANTLY MONONUCLEAR NO ORGANISMS SEEN CYTOSPIN SMEAR    Culture   Final    NO GROWTH 3 DAYS Performed at [facility], [place] [address] [place], [place] [address]    Report Status PENDING  Incomplete          Radiology Studies: No results found.      Scheduled Meds:  amLODipine  5 mg Oral q AM   aspirin EC  81 mg Oral Daily   buPROPion  300 mg Oral Daily   clopidogrel  75 mg Oral Daily   enoxaparin (LOVENOX) injection  40 mg Subcutaneous Q24H   feeding supplement  237 mL Oral BID BM  hydrALAZINE  25 mg Oral BID   metoprolol succinate  100 mg Oral Daily   multivitamin with minerals  1 tablet Oral Daily   topiramate  150 mg Oral QHS   venlafaxine XR  75 mg Oral Q  breakfast   vitamin B-12  1,000 mcg Oral Daily   Continuous Infusions:  lactated ringers 75 mL/hr at 05/30/21 1345     LOS: 6 days    Time spent: 35 minutes.     Elmarie Shiley, MD [facility]   If 7PM-7AM, please contact night-coverage [email or web address]  05/30/2021, 3:28 PM

## 2021-05-30 NOTE — Progress Notes (Addendum)
Neurology Progress Note   S:// Recalled by the primary team for concern for decreased level of consciousness and concern from infectious diseases that her underlying etiology for current presentation is vasculitis.  O:// Current vital signs: BP (!) 133/93 (BP Location: Left Arm)   Pulse 83   Temp 98 F (36.7 C) (Oral)   Resp 17   Ht 5\' 3"  (1.6 m)   Wt 60.8 kg   SpO2 98%   BMI 23.74 kg/m  Vital signs in last 24 hours: Temp:  [97.6 F (36.4 C)-101.3 F (38.5 C)] 98 F (36.7 C) (07/03 1331) Pulse Rate:  [83-112] 83 (07/03 1331) Resp:  [16-17] 17 (07/03 1331) BP: (111-151)/(66-95) 133/93 (07/03 1331) SpO2:  [97 %-98 %] 98 % (07/03 0531) General: Awake alert in no distress HEENT: Normocephalic/atraumatic Lung: Clear Cardiovascular: Regular rhythm Abdomen nondistended nontender Extremities warm well perfused Neurological examination awake alert oriented No aphasia No dysarthria Slightly diminished attention concentration Cranial 2-12 intact Motor examination with mild left-sided hemiparesis Sensation intact light touch Coordination with no dysmetria   Medications  Current Facility-Administered Medications:    acetaminophen (TYLENOL) tablet 650 mg, 650 mg, Oral, Q6H PRN, 650 mg at 05/30/21 0542 **OR** acetaminophen (TYLENOL) suppository 650 mg, 650 mg, Rectal, Q6H PRN, Orma Flaming, MD   amLODipine (NORVASC) tablet 5 mg, 5 mg, Oral, q AM, Orma Flaming, MD, 5 mg at 05/30/21 0542   aspirin EC tablet 81 mg, 81 mg, Oral, Daily, Rosalin Hawking, MD, 81 mg at 05/30/21 8144   buPROPion (WELLBUTRIN XL) 24 hr tablet 300 mg, 300 mg, Oral, Daily, Orma Flaming, MD, 300 mg at 05/30/21 8185   clopidogrel (PLAVIX) tablet 75 mg, 75 mg, Oral, Daily, Rosalin Hawking, MD, 75 mg at 05/30/21 0907   enoxaparin (LOVENOX) injection 40 mg, 40 mg, Subcutaneous, Q24H, Rosalin Hawking, MD, 40 mg at 05/29/21 1610   feeding supplement (ENSURE ENLIVE / ENSURE PLUS) liquid 237 mL, 237 mL, Oral, BID BM, Orma Flaming, MD, 237 mL at 05/30/21 1423   hydrALAZINE (APRESOLINE) tablet 25 mg, 25 mg, Oral, BID, Regalado, Belkys A, MD, 25 mg at 05/30/21 0908   HYDROcodone-acetaminophen (NORCO) 7.5-325 MG per tablet 1 tablet, 1 tablet, Oral, Q6H PRN, Regalado, Belkys A, MD, 1 tablet at 05/29/21 1751   lactated ringers infusion, , Intravenous, Continuous, Regalado, Belkys A, MD, Last Rate: 75 mL/hr at 05/30/21 1345, Infusion Verify at 05/30/21 1345   LORazepam (ATIVAN) tablet 1 mg, 1 mg, Oral, BID PRN, Orma Flaming, MD, 1 mg at 05/29/21 2056   metoprolol succinate (TOPROL-XL) 24 hr tablet 100 mg, 100 mg, Oral, Daily, Orma Flaming, MD, 100 mg at 05/30/21 0908   multivitamin with minerals tablet 1 tablet, 1 tablet, Oral, Daily, Regalado, Belkys A, MD, 1 tablet at 05/30/21 0908   ondansetron (ZOFRAN) tablet 4 mg, 4 mg, Oral, Q6H PRN, Orma Flaming, MD   tiZANidine (ZANAFLEX) tablet 4 mg, 4 mg, Oral, BID PRN, Orma Flaming, MD   topiramate (TOPAMAX) tablet 150 mg, 150 mg, Oral, QHS, Orma Flaming, MD, 150 mg at 05/29/21 2057   venlafaxine XR (EFFEXOR-XR) 24 hr capsule 75 mg, 75 mg, Oral, Q breakfast, Orma Flaming, MD, 75 mg at 05/30/21 6314   vitamin B-12 (CYANOCOBALAMIN) tablet 1,000 mcg, 1,000 mcg, Oral, Daily, Regalado, Belkys A, MD, 1,000 mcg at 05/30/21 0908   zolpidem (AMBIEN) tablet 5 mg, 5 mg, Oral, QHS PRN, Orma Flaming, MD, 5 mg at 05/29/21 2056 Labs CBC    Component Value Date/Time   WBC 6.1  05/30/2021 1334   RBC 3.53 (L) 05/30/2021 1334   HGB 10.4 (L) 05/30/2021 1334   HCT 31.6 (L) 05/30/2021 1334   PLT 284 05/30/2021 1334   MCV 89.5 05/30/2021 1334   MCH 29.5 05/30/2021 1334   MCHC 32.9 05/30/2021 1334   RDW 15.4 05/30/2021 1334   LYMPHSABS 0.9 04/07/2021 1450   MONOABS 0.7 04/07/2021 1450   EOSABS 0.2 04/07/2021 1450   BASOSABS 0.1 04/07/2021 1450    CMP     Component Value Date/Time   NA 135 05/30/2021 1334   K 3.9 05/30/2021 1334   CL 109 05/30/2021 1334   CO2 21 (L)  05/30/2021 1334   GLUCOSE 104 (H) 05/30/2021 1334   BUN 11 05/30/2021 1334   CREATININE 0.66 05/30/2021 1334   CALCIUM 8.7 (L) 05/30/2021 1334   PROT 6.4 (L) 05/26/2021 0236   ALBUMIN 2.9 (L) 05/26/2021 0236   AST 25 05/26/2021 0236   ALT 13 05/26/2021 0236   ALKPHOS 54 05/26/2021 0236   BILITOT 0.6 05/26/2021 0236   GFRNONAA >60 05/30/2021 1334   GFRAA >60 08/19/2020 0745    glycosylated hemoglobin  Lipid Panel     Component Value Date/Time   CHOL 96 05/27/2021 0106   TRIG 136 05/27/2021 0106   HDL 24 (L) 05/27/2021 0106   CHOLHDL 4.0 05/27/2021 0106   VLDL 27 05/27/2021 0106   LDLCALC 45 05/27/2021 0106     Imaging I have reviewed images in epic and the results pertinent to this consultation are: MRI brain with right basal ganglia/corona radiata infarct secondary to small vessel disease.  In addition, there is abnormal hyperintensity and mild enlargement of the left head of caudate, hyperintensity also in the left thalamus and small amount of hyperintensity in the right medial thalamus.  MRI with contrast showed mild largely vascular/perivascular appearing enhancement in the areas of signal abnormality in the deep gray matter nuclei as well as small amount of leptomeningeal enhancement over the right and possibly left convexities.  No solid enhancing mass.  Assessment: 70 year old woman anxiety depression migraine hypertension peripheral vascular disease admitted for falls generalized weakness fever difficulty walking and imbalance lack of appetite noted to have a right basal ganglia/parietal infarct for which she got a stroke work-up-see Dr.Xu's note from 05/28/2021. Work-up completed but primary team recall neurology for concern for vasculitis. Her CSF did show increased protein.   CRP elevated Hepatitis panel negative Rheumatoid factor negative ANA positive with normal double-stranded DNA.  ANCA pending. Husband and patient both provide history of memory issues that have  been ongoing now for a little while as well as chronic headaches.  She had migraines since the age of 43 but has had continuing headaches over the last few months to years. CNS vasculitis can be considered as a differential with the history but the appearance of the brain on the MRI is not completely consistent with CNS vasculitis. Other differentials are to  also be considered including NeuroBehcets.  Less likely neurosarcoid given the enhancement pattern.  CNS lymphoma also less likely but should be kept in differentials. Fevers, no infectious source found.  Could be due to deep gray matter especially thalamic/hypothalamic involvement leading to temperature dysregulation.  Patient does not have any history of oral or genital ulcers.  Recommendations: Will check ACE levels Add flow cytometry to CSF-unclear how useful that might be now after these many days of spinal tap as there might be degradation of cells already. There is no obvious signs of infection-I  have no problem and trial of steroids and then repeat imaging. I would recommend IV Solu-Medrol for 3 days starting today. We will follow  D/W Dr. Tyrell Antonio  -- Amie [place], MD Neurologist [facility] Pager: [phone]

## 2021-05-31 DIAGNOSIS — R9089 Other abnormal findings on diagnostic imaging of central nervous system: Secondary | ICD-10-CM

## 2021-05-31 LAB — CSF CULTURE W GRAM STAIN: Culture: NO GROWTH

## 2021-05-31 LAB — GLUCOSE, CAPILLARY: Glucose-Capillary: 137 mg/dL — ABNORMAL HIGH (ref 70–99)

## 2021-05-31 NOTE — Progress Notes (Addendum)
Subjective: No significant changes  Exam: Vitals:   05/31/21 1020 05/31/21 1341  BP: 122/81 135/87  Pulse: (!) 111 98  Resp: 18 16  Temp:  98.2 F (36.8 C)  SpO2: 95% 98%   Gen: In bed, NAD Resp: non-labored breathing, no acute distress Abd: soft, nt  Neuro: MS: awake, alert, oriented PN:TIRWE, VFF Motor: 5/5 on the right, 4/5 LUE, 4+/5 LLE Sensory:intact to LT   Pertinent Labs: CSF WBC 2 CSF RBC 26 CSF Protein 82 CSF glucose 52 CSF VDRL - pending CSF cytology - pending CSF flow cytometry - pending CSF oligoclonal bands - pending CSF mayo paraneoplstic panel - pending  ANA positive DSDNA - negative RF - negative Hepatitis antibody panel - negative RPR - non-reactive SSA,SSB - pending ANCA - pending C3,C4 - pending ACE - pending  TSH normal B12 254 B1 normal HIV negative    CRP 6.8 ESR 37   Impression: 44 F with a history of hypertension who presented with left-sided weakness in the setting of fever.  MRI revealed right-sided stroke as well as left-sided T2 change of unclear etiology.  Work-up has proceeded as above.  Etiology continues to be unclear, possibilities including granulomatous disease, other inflammatory process, or neoplastic process.  With negative CSF I think that infectious process is much less likely.  Lymphomatoid granulomatosis (angiocentric lymphoma) or intravascular lymphoma I think would explain both stroke as well as the T2 change and therefore would be a unifying diagnosis.  Lymphoma can cause fevers as well.  Flow cytometry and cytology are pending on this.  I think another round of investigation given that vasculitis has been strongly considered would be a formal angiogram.  If possible, MR spectroscopy may give some more information as well.  Steroids have been started empirically, and I think it is reasonable to continue this for a pulse dose, but I would not continue them long-term unless we have further evidence of the  need.  Also, would rule out marantic endocarditis, culture negative endocarditis or PFO with TEE.  Recommendations: 1) consider MR spectroscopy if possible 2) catheter angiogram 3) continue steroids for now 4) Epstein-Barr serology 5) TEE  6) follow-up pending testing as above  Roland Rack, MD [facility] [phone]  If 7pm- 7am, please page neurology on call as listed in [place].

## 2021-05-31 NOTE — Plan of Care (Signed)
  Problem: Education: Goal: Knowledge of disease or condition will improve Outcome: Progressing Goal: Knowledge of secondary prevention will improve Outcome: Progressing   Problem: Coping: Goal: Will identify appropriate support needs Outcome: Progressing   Problem: Self-Care: Goal: Ability to participate in self-care as condition permits will improve Outcome: Progressing Goal: Verbalization of feelings and concerns over difficulty with self-care will improve Outcome: Progressing Goal: Ability to communicate needs accurately will improve Outcome: Progressing   Problem: Nutrition: Goal: Risk of aspiration will decrease Outcome: Progressing

## 2021-05-31 NOTE — Plan of Care (Signed)
  Problem: Education: Goal: Knowledge of General Education information will improve Description: Including pain rating scale, medication(s)/side effects and non-pharmacologic comfort measures Outcome: Progressing   Problem: Health Behavior/Discharge Planning: Goal: Ability to manage health-related needs will improve Outcome: Progressing   Problem: Clinical Measurements: Goal: Ability to maintain clinical measurements within normal limits will improve Outcome: Progressing Goal: Will remain free from infection Outcome: Progressing Goal: Diagnostic test results will improve Outcome: Progressing Goal: Respiratory complications will improve Outcome: Progressing Goal: Cardiovascular complication will be avoided Outcome: Progressing   Problem: Activity: Goal: Risk for activity intolerance will decrease Outcome: Progressing   Problem: Nutrition: Goal: Adequate nutrition will be maintained Outcome: Progressing   Problem: Coping: Goal: Level of anxiety will decrease Outcome: Progressing   Problem: Elimination: Goal: Will not experience complications related to bowel motility Outcome: Progressing Goal: Will not experience complications related to urinary retention Outcome: Progressing   Problem: Pain Managment: Goal: General experience of comfort will improve Outcome: Progressing   Problem: Safety: Goal: Ability to remain free from injury will improve Outcome: Progressing   Problem: Coping: Goal: Will identify appropriate support needs Outcome: Progressing   Problem: Self-Care: Goal: Ability to participate in self-care as condition permits will improve Outcome: Progressing Goal: Verbalization of feelings and concerns over difficulty with self-care will improve Outcome: Progressing Goal: Ability to communicate needs accurately will improve Outcome: Progressing   Problem: Nutrition: Goal: Risk of aspiration will decrease Outcome: Progressing

## 2021-05-31 NOTE — Progress Notes (Signed)
Will make NPO after MN for possible TEE tomorrow, will be scheduled tomorrow.

## 2021-05-31 NOTE — Progress Notes (Signed)
Physical Therapy Treatment Patient Details Name: Michaela Rios MRN: [id number] DOB: [birth year] Today's Date: 05/31/2021    History of Present Illness Pt is a 70 y.o. female admitted 05/24/21 with c/o weakness, fall due to loss of balance, poor PO intake. Workup for sepsis, unclear etiology. Pt with confusion, neuro consult for possible CNS infection. Brain MRI 6/29 showed R basal ganglia stroke in addition to accelerated small vessel disease. PMH includes HTN, CKD 3, migraines, chronic LBP, prediabetes, anxiety, depression.    PT Comments    Pt tolerates treatment well with increased ambulation distance. Pt continues to demonstrate significant motor planning deficits, often with shuffling gait and freezing instances with turns or distraction. With tactile cueing and PT physical assistance the pt is able to consistently perform step through gait with increased gait speed. Pt will continue to benefit from aggressive mobilization and PT POC to restore independence in mobility.   Follow Up Recommendations  CIR;Supervision/Assistance - 24 hour     Equipment Recommendations  None recommended by PT    Recommendations for Other Services       Precautions / Restrictions Precautions Precautions: Fall Precaution Comments: urinary urgency/incontinence (has Depends in room) Restrictions Weight Bearing Restrictions: No    Mobility  Bed Mobility Overal bed mobility: Needs Assistance Bed Mobility: Supine to Sit     Supine to sit: Min assist;HOB elevated     General bed mobility comments: Min A to elevate trunk to sitting EOB.    Transfers Overall transfer level: Needs assistance Equipment used: Rolling walker (2 wheeled) Transfers: Sit to/from Stand Sit to Stand: Min assist;Min guard         General transfer comment: verbal cues for hand placement, foot width  Ambulation/Gait Ambulation/Gait assistance: Min assist Gait Distance (Feet): 30 Feet (additonal trial of  25') Assistive device: Rolling walker (2 wheeled) Gait Pattern/deviations: Step-to pattern;Step-through pattern Gait velocity: reduced Gait velocity interpretation: <1.31 ft/sec, indicative of household ambulator General Gait Details: pt with slow shuffling gait initially, PT provides cues for increased step/stride length and to promote continuous movement of RW when ambulating forward. Pt is able to consistently perform step-through gait with minA of PT to propel RW   Stairs             Wheelchair Mobility    Modified Rankin (Stroke Patients Only) Modified Rankin (Stroke Patients Only) Pre-Morbid Rankin Score: No significant disability Modified Rankin: Moderately severe disability     Balance Overall balance assessment: Needs assistance Sitting-balance support: No upper extremity supported;Feet supported Sitting balance-Leahy Scale: Fair Sitting balance - Comments: Pt able to maintain midline posture with no cueing this session   Standing balance support: Single extremity supported;Bilateral upper extremity supported Standing balance-Leahy Scale: Poor Standing balance comment: reliant on UE support                            Cognition Arousal/Alertness: Awake/alert Behavior During Therapy: WFL for tasks assessed/performed Overall Cognitive Status: Impaired/Different from baseline Area of Impairment: Problem solving                             Problem Solving: Slow processing General Comments: Improved alertness and conversation today. Pt continues to demonstrate motor planning deficits, slowed processing, decreased attention      Exercises      General Comments General comments (skin integrity, edema, etc.): VSS on RA      Pertinent Vitals/Pain  Pain Assessment: No/denies pain    Home Living                      Prior Function            PT Goals (current goals can now be found in the care plan section) Acute Rehab PT  Goals Patient Stated Goal: Ready to go to rehab Progress towards PT goals: Progressing toward goals    Frequency    Min 4X/week      PT Plan Current plan remains appropriate    Co-evaluation              AM-PAC PT "6 Clicks" Mobility   Outcome Measure  Help needed turning from your back to your side while in a flat bed without using bedrails?: A Little Help needed moving from lying on your back to sitting on the side of a flat bed without using bedrails?: A Little Help needed moving to and from a bed to a chair (including a wheelchair)?: A Little Help needed standing up from a chair using your arms (e.g., wheelchair or bedside chair)?: A Little Help needed to walk in hospital room?: A Little Help needed climbing 3-5 steps with a railing? : Total 6 Click Score: 16    End of Session   Activity Tolerance: Patient tolerated treatment well Patient left: in chair;with call bell/phone within reach;with chair alarm set;with family/visitor present Nurse Communication: Mobility status PT Visit Diagnosis: Other abnormalities of gait and mobility (R26.89);Muscle weakness (generalized) (M62.81);Other symptoms and signs involving the nervous system (R29.898)     Time: 1518-3437 PT Time Calculation (min) (ACUTE ONLY): 40 min  Charges:  $Gait Training: 23-37 mins $Therapeutic Activity: 8-22 mins                     Zenaida Niece, PT, DPT Acute Rehabilitation Pager: [phone]    Zenaida Niece 05/31/2021, 1:19 PM

## 2021-05-31 NOTE — Progress Notes (Signed)
PROGRESS NOTE    Michaela Rios  XBJ:[id number] DOB: 03-30-1951 DOA: 05/24/2021 PCP: Binnie Rail, MD   Brief Narrative: 70 year old with past medical history significant for hypertension, prediabetes, depression, CKD stage II, history of PUD, chronic low back pain, migraine who presented to the ED with generalized weakness and loss of balance.  Patient fell on Sunday while trying to go to the bathroom and lost her balance.  She has been having shivering.  She denies cough, dysuria, abdominal pain.  She developed diarrhea overnight.  Patient was found to have high fever 101.4 on admission.  Elevated D-dimer.  CTA negative for PE.  Dopplers lower extremity negative for DVT. Admitted with SIRS criteria unclear etiology.   Assessment & Plan:   Principal Problem:   Sepsis ([place]) Active Problems:   Anxiety   Depression   Essential hypertension   Prediabetes   Hypokalemia   Tachycardia  1-SIRS:  -Presents with fever (6/27 tempeture 101) , tachycardia, tachypnea,  mild elevation of lactic acid. Confusion.  -Chest x ray negative for PNA. UA; negative for UTI. COVID PCR negative -Urine culture no growth to date. and blood cultures: no growth to date.  -CT abdomen and pelvis: Negative for acute infectious process, hepatic asteatosis -No Nuchal rigidity. Complaints of chronic migraines.  -Vancomycin and flagyl discontinue 6/29. Completed 5 days of Aztreonam.  -Vasculitis work up: ANA positive, Double strand DNA negative,  RF negative. ANCA; pending. Cryoglobulin pending., hepatitis panel negative, complement  pending. Sjogren's pending. , ESR 37, CRP; 6.8. -ID consulted due to persistent fever. ID recommend hold on IV antibiotics. If recurrent fever repeat Blood culture. I placed ordered for repeat blood cultures 7/03. RPR negative. They recommend follow up vasculitis work up.  -Patient does not have any systemic Vasculitis symptoms; No pulmonary affection, no joint pain , no abdominal  pain.  -Repeated Blood culture 7/03.; No growth to date.   2-Acute infarct Right basal Ganglia:  Acute metabolic encephalopathy;  -Husband report patient had confusion episode 2/29. -CT: New multifocal hypoattenuation in the basilar ganglia and thalami bilaterally of indeterminate etiology.  Consideration include multifocal infarcts, toxic metabolic insults, hypoxic ischemic injury and infection.  No mass-effect to suggest tumor. Correlate  with MRI.  -MRI: Right basal ganglia infarct, hyperintensity and mild enlargement of the left head of caudate, left thalamus and a small amount of hyperintensity in the right medial thalamus.  Vasogenic type edema in the deep white matter structures on the left.  Recommend postcontrast MRI to further evaluate. -MRI with Contrast: Mild, largely vascular/perivascular appearing enhancement in the areas of signal abnormality in the deep gray nuclei as well as a small amount of leptomeningeal enhancement over the right and possibly left parietal convexities.  No solid mass enhancing.  Differential Diagnosis: Cryptococcus, vasculitis, or unusual neoplasm and so as angiocentric lymphoma and granulomatosis/autoimmune or other inflammatory condition.  -LDL ;45. Discussed with Neurology no need for statins, LDL below goal.  -Continue with aspirin -On aspirin and plavix.  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 Fever :  -LP elevated protein, no WBC, VDRL CSF pending, HSV SCF pending. Culture: no growth to date. Cytology pending, Oligoclonal bands pending, paraneoplastic encephalitis panel pending.  -EEG; negative for seizure.  -Labs; autoimmune work up: RF negative, , Sjogren's pending , ANCA, HIV non reactive, CRP 6.8 , ESR 37 , ANA positive, Double strand DNA negative. , B 1: 115. B12; 254. VDRL SCF Pending, Oligoclonal CSF pending.  Cryoglobulin,  hepatitis panel negative,  complement ordered.  -She will need repeat MRI  in one month.  -On B 12 supplement.  -Patient had fever this am, she is feeling more sleepy today, report mild headaches. I discussed with Dr Rory Percy, neurologist , he will evaluate patient today and will provide further recommendations.  -Started on IV steroids by neurologist,plan for 3 days. ACE level ordered  -Differential: CNS vasculitis, neuroBehcets, less likely neurosarcoidosis.   Hypokalemia; replaced.   HTN;  Continue with Norvasc, hydralazine and metoprolol.//  Tachycardia; -received IV fluids.   Generalized weakness.  IV fluids.  PT , OT  B 12. Low normal, on supplement.   Depression;  Continue with Wellbutrin, Effexor.   CKD stage II Stable.   Migraines; Continue with topiromate.    Pulmonary Nodule. Needs to follow up with PCP.  Positive D dimer; Doppler LE negative for DVT> CTA negative for PE>   Nutrition Problem: Inadequate oral intake Etiology: decreased appetite    Signs/Symptoms: per patient/family report, meal completion < 25%    Interventions: Ensure Enlive (each supplement provides 350kcal and 20 grams of protein), Magic cup, MVI  Estimated body mass index is 23.74 kg/m as calculated from the following:   Height as of this encounter: _0  (1.6 m).   Weight as of this encounter: 60.8 kg.   DVT prophylaxis: Lovenox Code Status: Full code Family Communication: Husband who was at bedside Disposition Plan:  Status is: Inpatient  Remains inpatient appropriate because:IV treatments appropriate due to intensity of illness or inability to take PO  Dispo: The patient is from: Home              Anticipated d/c is to:  CIR              Patient currently is medically stable to d/c.   Difficult to place patient No        Consultants:  None  Procedures:  none  Antimicrobials:  Vancomycin Aztreonam.   Subjective:  She is feeling better today. She was up in the sink with assistance of OT>  She denies headaches. She doesn't have a  neurologist. Her neurologist left practice./  Will plan to refer her to [facility] neurologist.      Objective: Vitals:   05/30/21 2139 05/31/21 0549 05/31/21 0815 05/31/21 1020  BP: (!) 144/94 (!) 150/98  122/81  Pulse:  96 99 (!) 111  Resp:  18    Temp:  98.5 F (36.9 C)    TempSrc:  Oral    SpO2:  98% 100% 95%  Weight:      Height:        Intake/Output Summary (Last 24 hours) at 05/31/2021 1150 Last data filed at 05/31/2021 0550 Gross per 24 hour  Intake 1958.69 ml  Output 1600 ml  Net 358.69 ml    Filed Weights   05/24/21 0956  Weight: 60.8 kg    Examination:  General exam: NAD Respiratory system: CTA Cardiovascular system: S 1, S 2 RRR Gastrointestinal system: BS present, soft, nt Central nervous system: alert, interactive Extremities: No edema    Data Reviewed: I have personally reviewed following labs and imaging studies  CBC: Recent Labs  Lab 05/25/21 0155 05/26/21 0236 05/27/21 0106 05/28/21 0211 05/30/21 1334  WBC 8.1 6.1 7.2 6.6 6.1  HGB 10.5* 11.1* 10.7* 10.3* 10.4*  HCT 32.9* 34.3* 32.2* 30.6* 31.6*  MCV 93.2 91.0 88.7 89.0 89.5  PLT 199 221 249 231 540    Basic Metabolic Panel:  Recent Labs  Lab 05/24/21 1940 05/25/21 0155 05/26/21 0236 05/27/21 0106 05/28/21 0211 05/30/21 1334  NA  --  137 132* 130* 134* 135  K  --  3.9 3.6 3.4* 3.3* 3.9  CL  --  106 103 102 107 109  CO2  --  24 18* 18* 20* 21*  GLUCOSE  --  101* 94 97 107* 104*  BUN  --  5* _0 CREATININE  --  0.71 0.70 0.74 0.64 0.66  CALCIUM  --  8.7* 8.9 8.4* 8.2* 8.7*  MG 2.3  --   --   --   --   --     GFR: Estimated Creatinine Clearance: 54.1 mL/min (by C-G formula based on SCr of 0.66 mg/dL). Liver Function Tests: Recent Labs  Lab 05/26/21 0236  AST 25  ALT 13  ALKPHOS 54  BILITOT 0.6  PROT 6.4*  ALBUMIN 2.9*    No results for input(s): LIPASE, AMYLASE in the last 168 hours. No results for input(s): AMMONIA in the last 168 hours. Coagulation  Profile: No results for input(s): INR, PROTIME in the last 168 hours.  Cardiac Enzymes: No results for input(s): CKTOTAL, CKMB, CKMBINDEX, TROPONINI in the last 168 hours. BNP (last 3 results) No results for input(s): PROBNP in the last 8760 hours. HbA1C: No results for input(s): HGBA1C in the last 72 hours.  CBG: No results for input(s): GLUCAP in the last 168 hours. Lipid Profile: No results for input(s): CHOL, HDL, LDLCALC, TRIG, CHOLHDL, LDLDIRECT in the last 72 hours.  Thyroid Function Tests: No results for input(s): TSH, T4TOTAL, FREET4, T3FREE, THYROIDAB in the last 72 hours.  Anemia Panel: No results for input(s): VITAMINB12, FOLATE, FERRITIN, TIBC, IRON, RETICCTPCT in the last 72 hours.  Sepsis Labs: Recent Labs  Lab 05/24/21 1227 05/24/21 1940  PROCALCITON  --  <0.10  LATICACIDVEN 1.5  --      Recent Results (from the past 240 hour(s))  Blood culture (routine x 2)     Status: None   Collection Time: 05/24/21 10:51 AM   Specimen: BLOOD  Result Value Ref Range Status   Specimen Description BLOOD RIGHT ANTECUBITAL  Final   Special Requests   Final    BOTTLES DRAWN AEROBIC AND ANAEROBIC Blood Culture adequate volume   Culture   Final    NO GROWTH 5 DAYS Performed at [facility], [address] [place], [place] [address]    Report Status 05/29/2021 FINAL  Final  Resp Panel by RT-PCR (Flu A&B, Covid) Nasopharyngeal Swab     Status: None   Collection Time: 05/24/21 11:14 AM   Specimen: Nasopharyngeal Swab; Nasopharyngeal(NP) swabs in vial transport medium  Result Value Ref Range Status   SARS Coronavirus 2 by RT PCR NEGATIVE NEGATIVE Final    Comment: (NOTE) SARS-CoV-2 target nucleic acids are NOT DETECTED.  The SARS-CoV-2 RNA is generally detectable in upper respiratory specimens during the acute phase of infection. The lowest concentration of SARS-CoV-2 viral copies this assay can detect is 138 copies/mL. A negative result does not preclude  SARS-Cov-2 infection and should not be used as the sole basis for treatment or other patient management decisions. A negative result may occur with  improper specimen collection/handling, submission of specimen other than nasopharyngeal swab, presence of viral mutation(s) within the areas targeted by this assay, and inadequate number of viral copies(<138 copies/mL). A negative result must be combined with clinical observations, patient history, and epidemiological information. The expected result  is Negative.  Fact Sheet for Patients:  EntrepreneurPulse.com.au  Fact Sheet for Healthcare Providers:  IncredibleEmployment.be  This test is no t yet approved or cleared by the [place] FDA and  has been authorized for detection and/or diagnosis of SARS-CoV-2 by FDA under an Emergency Use Authorization (EUA). This EUA will remain  in effect (meaning this test can be used) for the duration of the COVID-19 declaration under Section 564(b)(1) of the Act, 21 U.S.C.section 360bbb-3(b)(1), unless the authorization is terminated  or revoked sooner.       Influenza A by PCR NEGATIVE NEGATIVE Final   Influenza B by PCR NEGATIVE NEGATIVE Final    Comment: (NOTE) The Xpert Xpress SARS-CoV-2/FLU/RSV plus assay is intended as an aid in the diagnosis of influenza from Nasopharyngeal swab specimens and should not be used as a sole basis for treatment. Nasal washings and aspirates are unacceptable for Xpert Xpress SARS-CoV-2/FLU/RSV testing.  Fact Sheet for Patients: EntrepreneurPulse.com.au  Fact Sheet for Healthcare Providers: IncredibleEmployment.be  This test is not yet approved or cleared by the [place] FDA and has been authorized for detection and/or diagnosis of SARS-CoV-2 by FDA under an Emergency Use Authorization (EUA). This EUA will remain in effect (meaning this test can be used) for the duration of  the COVID-19 declaration under Section 564(b)(1) of the Act, 21 U.S.C. section 360bbb-3(b)(1), unless the authorization is terminated or revoked.  Performed at [facility], [place] [address], [place], [place] [address]   Blood culture (routine x 2)     Status: None   Collection Time: 05/24/21 12:03 PM   Specimen: BLOOD  Result Value Ref Range Status   Specimen Description BLOOD LEFT ANTECUBITAL  Final   Special Requests   Final    BOTTLES DRAWN AEROBIC AND ANAEROBIC Blood Culture adequate volume   Culture   Final    NO GROWTH 5 DAYS Performed at [facility], [place] [address] [place], [place] [address]    Report Status 05/29/2021 FINAL  Final  Urine culture     Status: None   Collection Time: 05/24/21 12:48 PM   Specimen: Urine, Clean Catch  Result Value Ref Range Status   Specimen Description URINE, CLEAN CATCH  Final   Special Requests NONE  Final   Culture   Final    NO GROWTH Performed at [facility], [place] [address], [place], [place] [address]    Report Status 05/25/2021 FINAL  Final  CSF culture w Gram Stain     Status: None   Collection Time: 05/27/21  7:14 PM   Specimen: CSF; Cerebrospinal Fluid  Result Value Ref Range Status   Specimen Description CSF  Final   Special Requests NONE  Final   Gram Stain   Final    WBC PRESENT, PREDOMINANTLY MONONUCLEAR NO ORGANISMS SEEN CYTOSPIN SMEAR    Culture   Final    NO GROWTH 3 DAYS Performed at [facility], [place] [address] [place], [place] [address]    Report Status 05/31/2021 FINAL  Final  Culture, blood (routine x 2)     Status: None (Preliminary result)   Collection Time: 05/30/21  4:11 PM   Specimen: BLOOD RIGHT HAND  Result Value Ref Range Status   Specimen Description BLOOD RIGHT HAND  Final   Special Requests   Final    BOTTLES DRAWN AEROBIC AND ANAEROBIC Blood Culture results may not be optimal due to an inadequate volume of blood received in culture  bottles   Culture   Final    NO  GROWTH < 24 HOURS Performed at [facility], [place] [address], [place], [place] [address]    Report Status PENDING  Incomplete  Culture, blood (routine x 2)     Status: None (Preliminary result)   Collection Time: 05/30/21  4:11 PM   Specimen: BLOOD RIGHT HAND  Result Value Ref Range Status   Specimen Description BLOOD RIGHT HAND  Final   Special Requests   Final    BOTTLES DRAWN AEROBIC ONLY Blood Culture results may not be optimal due to an inadequate volume of blood received in culture bottles   Culture   Final    NO GROWTH < 24 HOURS Performed at [facility], [place] [address] [place],  [address]    Report Status PENDING  Incomplete          Radiology Studies: No results found.      Scheduled Meds:  amLODipine  5 mg Oral q AM   aspirin EC  81 mg Oral Daily   buPROPion  300 mg Oral Daily   calcium carbonate  1 tablet Oral Q breakfast   cholecalciferol  1,000 Units Oral Daily   clopidogrel  75 mg Oral Daily   enoxaparin (LOVENOX) injection  40 mg Subcutaneous Q24H   feeding supplement  237 mL Oral BID BM   hydrALAZINE  25 mg Oral BID   metoprolol succinate  100 mg Oral Daily   multivitamin with minerals  1 tablet Oral Daily   pantoprazole  40 mg Oral Daily   topiramate  150 mg Oral QHS   venlafaxine XR  75 mg Oral Q breakfast   vitamin B-12  1,000 mcg Oral Daily   Continuous Infusions:  lactated ringers 75 mL/hr at 05/31/21 0515   methylPREDNISolone (SOLU-MEDROL) injection 1,000 mg (05/31/21 1025)     LOS: 7 days    Time spent: 35 minutes.     Elmarie Shiley, MD [facility]   If 7PM-7AM, please contact night-coverage [email or web address]  05/31/2021, 11:50 AM

## 2021-05-31 NOTE — Progress Notes (Signed)
Occupational Therapy Treatment Patient Details Name: Michaela Rios MRN: [id number] DOB: 1950-12-25 Today's Date: 05/31/2021    History of present illness Pt is a 70 y.o. female admitted 05/24/21 with c/o weakness, fall due to loss of balance, poor PO intake. Workup for sepsis, unclear etiology. Pt with confusion, neuro consult for possible CNS infection. Brain MRI 6/29 showed R basal ganglia stroke in addition to accelerated small vessel disease. PMH includes HTN, CKD 3, migraines, chronic LBP, prediabetes, anxiety, depression.   OT comments  Pt making progress towards OT goals this session. She presented with increased activity tolerance this session as well as bodily awareness to maintain midline in sitting and standing. Pt complete grooming sitting at the sink with supervision, toileting and bathing sitting and standing with min guard-min A. Pt continues to move slowly and carefully, highly distractible by lines when up and moving. Pt tolerated continued activities for an hour this session with no rest breaks. She will benefit from intensive rehab services at [facility] to assist with maximizing her independence and assisting with relieving caregiver burden once able to return home. Acute OT will continue to follow while pt is in the hospital, to address all OT goals.    Follow Up Recommendations  CIR    Equipment Recommendations  Other (comment) (RW)    Recommendations for Other Services Rehab consult    Precautions / Restrictions Precautions Precautions: Fall Precaution Comments: urinary urgency/incontinence (has Depends in room) Restrictions Weight Bearing Restrictions: No       Mobility Bed Mobility Overal bed mobility: Needs Assistance Bed Mobility: Supine to Sit     Supine to sit: Min assist;HOB elevated     General bed mobility comments: Min A to elevate trunk to sitting EOB.    Transfers Overall transfer level: Needs assistance Equipment used: Rolling walker (2  wheeled) Transfers: Sit to/from Stand Sit to Stand: Min assist         General transfer comment: Min A to stand for bed and static chair this session for transfers.    Balance Overall balance assessment: Needs assistance Sitting-balance support: Bilateral upper extremity supported;Feet supported;No upper extremity supported;Single extremity supported Sitting balance-Leahy Scale: Fair Sitting balance - Comments: Pt able to maintain midline posture with no cueing this session   Standing balance support: Bilateral upper extremity supported;During functional activity Standing balance-Leahy Scale: Poor Standing balance comment: Pt requiring ue support throuhgout standing activities.                           ADL either performed or assessed with clinical judgement   ADL Overall ADL's : Needs assistance/impaired     Grooming: Wash/dry hands;Wash/dry face;Oral care;Sitting;Supervision/safety Grooming Details (indicate cue type and reason): completed sitting in chair at sink     Lower Body Bathing: Minimal assistance;Sitting/lateral leans;Sit to/from stand Lower Body Bathing Details (indicate cue type and reason): completed at sink     Lower Body Dressing: Moderate assistance;Sit to/from stand;Sitting/lateral leans Lower Body Dressing Details (indicate cue type and reason): Needed assist bringing briefs over her feet and steadying her while she pulled them up in standing. Toilet Transfer: Minimal assistance;Ambulation Toilet Transfer Details (indicate cue type and reason): Pt requiring min A to cue hand placement and safe walker usage as she ambulated Toileting- Clothing Manipulation and Hygiene: Min guard;Sitting/lateral lean;Sit to/from stand       Functional mobility during ADLs: Minimal assistance;Rolling walker       Vision   Vision Assessment?:  Vision impaired- to be further tested in functional context Additional Comments: Pt able to find her items for  grooming at the sink with no assist.   Perception     Praxis      Cognition Arousal/Alertness: Awake/alert Behavior During Therapy: WFL for tasks assessed/performed Overall Cognitive Status: Within Functional Limits for tasks assessed                                 General Comments: Improved alertness and conversation today. Pt continues to demonstrate motor planning deficits, slowed processing, decreased attention        Exercises     Shoulder Instructions       General Comments VSS on RA, HR maintaining 110-120 throughout session.    Pertinent Vitals/ Pain       Pain Assessment: No/denies pain  Home Living                                          Prior Functioning/Environment              Frequency  Min 2X/week        Progress Toward Goals  OT Goals(current goals can now be found in the care plan section)  Progress towards OT goals: Progressing toward goals  Acute Rehab OT Goals Patient Stated Goal: Ready to go to rehab OT Goal Formulation: With patient/family Time For Goal Achievement: 06/11/21 Potential to Achieve Goals: Good ADL Goals Pt Will Perform Grooming: with supervision;standing Pt Will Perform Lower Body Bathing: with min guard assist;sitting/lateral leans;sit to/from stand Pt Will Perform Lower Body Dressing: with min guard assist;sitting/lateral leans;sit to/from stand Pt Will Transfer to Toilet: with supervision;ambulating Pt Will Perform Toileting - Clothing Manipulation and hygiene: with min guard assist;sitting/lateral leans;sit to/from stand Pt/caregiver will Perform Home Exercise Program: Increased strength;Both right and left upper extremity;Independently;With written HEP provided  Plan Discharge plan remains appropriate;Frequency remains appropriate    Co-evaluation                 AM-PAC OT "6 Clicks" Daily Activity     Outcome Measure   Help from another person eating meals?:  None Help from another person taking care of personal grooming?: A Little Help from another person toileting, which includes using toliet, bedpan, or urinal?: A Little Help from another person bathing (including washing, rinsing, drying)?: A Lot Help from another person to put on and taking off regular upper body clothing?: A Little Help from another person to put on and taking off regular lower body clothing?: A Little 6 Click Score: 18    End of Session Equipment Utilized During Treatment: Rolling walker  OT Visit Diagnosis: Unsteadiness on feet (R26.81);Other abnormalities of gait and mobility (R26.89);Muscle weakness (generalized) (M62.81)   Activity Tolerance Patient tolerated treatment well   Patient Left in chair;with call bell/phone within reach;with chair alarm set;with family/visitor present   Nurse Communication Mobility status        Time: 8786-7672 OT Time Calculation (min): 67 min  Charges: OT General Charges $OT Visit: 1 Visit OT Treatments $Self Care/Home Management : 53-67 mins  Shaunda Tipping H., OTR/L Acute Rehabilitation   Angelli Baruch Elane Salihah Peckham 05/31/2021, 12:58 PM

## 2021-05-31 NOTE — Progress Notes (Addendum)
Consent for Transesophageal Echo   Per Dr Tyrell Antonio plan for possible TEE REcords reviewed, spoke with patient    No contraindications for procedure noted  Risks/ benefits described  to patient and to her husband   Both patient and her husband appear to understand and agree to have her proceed.  Dorris Carnes MD

## 2021-06-01 ENCOUNTER — Inpatient Hospital Stay (HOSPITAL_COMMUNITY): Payer: Medicare HMO

## 2021-06-01 ENCOUNTER — Telehealth: Payer: Medicare HMO

## 2021-06-01 DIAGNOSIS — R509 Fever, unspecified: Secondary | ICD-10-CM

## 2021-06-01 DIAGNOSIS — I97630 Postprocedural hematoma of a circulatory system organ or structure following a cardiac catheterization: Secondary | ICD-10-CM

## 2021-06-01 DIAGNOSIS — M79602 Pain in left arm: Secondary | ICD-10-CM

## 2021-06-01 DIAGNOSIS — I739 Peripheral vascular disease, unspecified: Secondary | ICD-10-CM

## 2021-06-01 HISTORY — PX: IR US GUIDE VASC ACCESS RIGHT: IMG2390

## 2021-06-01 HISTORY — PX: IR ANGIO INTRA EXTRACRAN SEL INTERNAL CAROTID UNI R MOD SED: IMG5362

## 2021-06-01 HISTORY — PX: IR ANGIO EXTERNAL CAROTID SEL EXT CAROTID UNI R MOD SED: IMG5371

## 2021-06-01 HISTORY — PX: IR ANGIO VERTEBRAL SEL VERTEBRAL UNI R MOD SED: IMG5368

## 2021-06-01 HISTORY — PX: IR ANGIO INTRA EXTRACRAN SEL COM CAROTID INNOMINATE UNI L MOD SED: IMG5358

## 2021-06-01 LAB — BASIC METABOLIC PANEL
Anion gap: 8 (ref 5–15)
BUN: 18 mg/dL (ref 8–23)
CO2: 20 mmol/L — ABNORMAL LOW (ref 22–32)
Calcium: 8.9 mg/dL (ref 8.9–10.3)
Chloride: 109 mmol/L (ref 98–111)
Creatinine, Ser: 0.58 mg/dL (ref 0.44–1.00)
GFR, Estimated: >60 mL/min (ref >60)
Glucose, Bld: 146 mg/dL — ABNORMAL HIGH (ref 70–99)
Potassium: 3.5 mmol/L (ref 3.5–5.1)
Sodium: 137 mmol/L (ref 135–145)

## 2021-06-01 LAB — SJOGRENS SYNDROME-A EXTRACTABLE NUCLEAR ANTIBODY: SSA (Ro) (ENA) Antibody, IgG: <0.2 AI (ref 0.0–0.9)

## 2021-06-01 LAB — C3 COMPLEMENT: C3 Complement: 84 mg/dL (ref 82–167)

## 2021-06-01 LAB — VDRL, CSF: VDRL Quant, CSF: NONREACTIVE

## 2021-06-01 LAB — SJOGRENS SYNDROME-B EXTRACTABLE NUCLEAR ANTIBODY: SSB (La) (ENA) Antibody, IgG: <0.2 AI (ref 0.0–0.9)

## 2021-06-01 LAB — EPSTEIN-BARR VIRUS (EBV) ANTIBODY PROFILE
EBV NA IgG: 301 U/mL — ABNORMAL HIGH (ref 0.0–17.9)
EBV VCA IgG: 175 U/mL — ABNORMAL HIGH (ref 0.0–17.9)
EBV VCA IgM: <36 U/mL (ref 0.0–35.9)

## 2021-06-01 LAB — ANCA TITERS
Atypical P-ANCA titer: <1:20 {titer}
C-ANCA: <1:20 {titer}
P-ANCA: <1:20 {titer}

## 2021-06-01 LAB — ANGIOTENSIN CONVERTING ENZYME: Angiotensin-Converting Enzyme: 50 U/L (ref 14–82)

## 2021-06-01 LAB — C4 COMPLEMENT: Complement C4, Body Fluid: 12 mg/dL (ref 12–38)

## 2021-06-01 LAB — FLOW CYTOMETRY REQUEST - FLUID (INPATIENT): Lymphocyte subsets: UNDETERMINED

## 2021-06-01 IMAGING — XA IR CARO CERE HEAD/NECK UNILAT LEFT (MS)
11 of 15 series · 12 of 24 positions shown · non-contrast
Comparison: CT/CT angiogram of the head and neck [DATE]

INDICATION: 69-year-old female with past medical history significant for
hypertension presenting with left-sided weakness and fever. MRI of
the brain revealed right-sided stroke as well as left-sided deep
gray nuclei increased T2 signal of unclear etiology. Given the
possibility of CNS vasculitis, a diagnostic cerebral angiogram was
requested.

EXAM:
ULTRASOUND-GUIDED VASCULAR [REDACTED] CEREBRAL ANGIOGRAM
TECHNIQUE: Informed written consent was obtained from the patient after a
thorough discussion of the procedural risks, benefits and
alternatives. All questions were addressed.

[Series 1: fl neuro n · 1 of 9 frames shown (1 of 2)]
[frame 8/9]
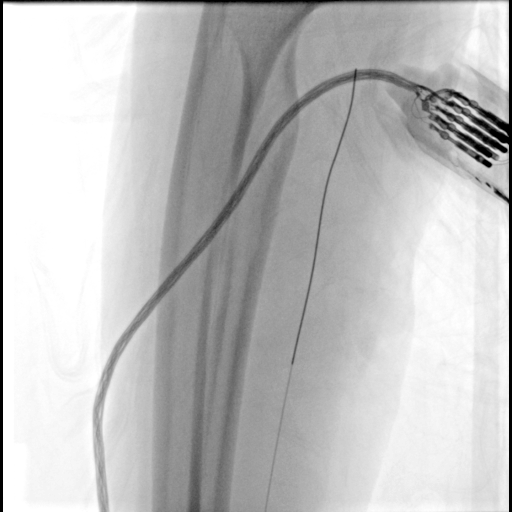

[Series 2: n roadmap · 1 of 47 frames shown]
[frame 40/47]
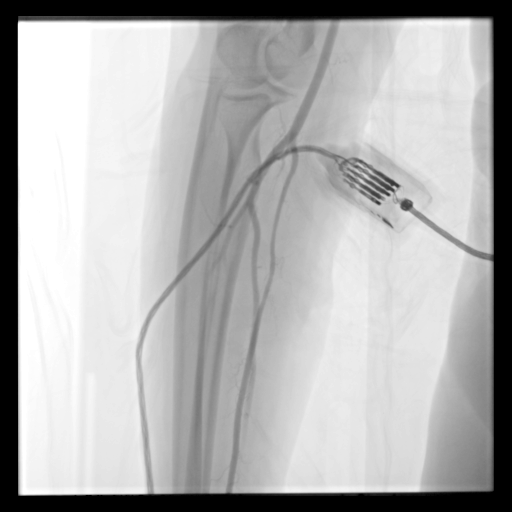

[Series 4: cerebral care 2 · 2 acquisitions, 1 frame shown (1 of 7)]
[im 1/2]
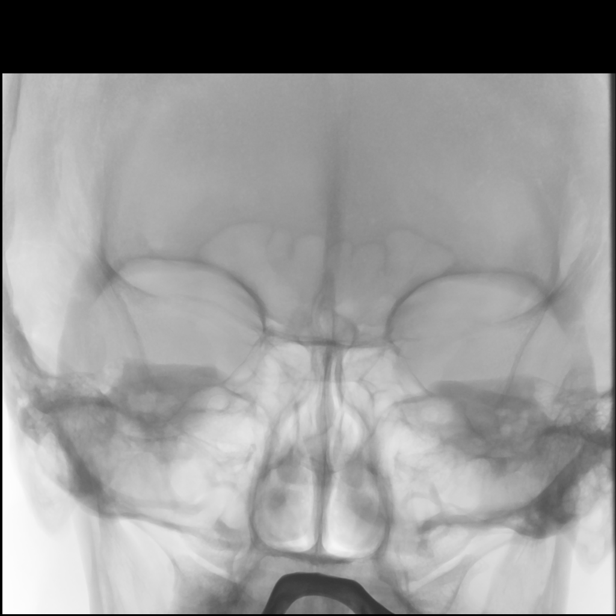

[Series 5: cerebral care 2 · 2 acquisitions, 1 frame shown (2 of 7)]
[im 1/2]
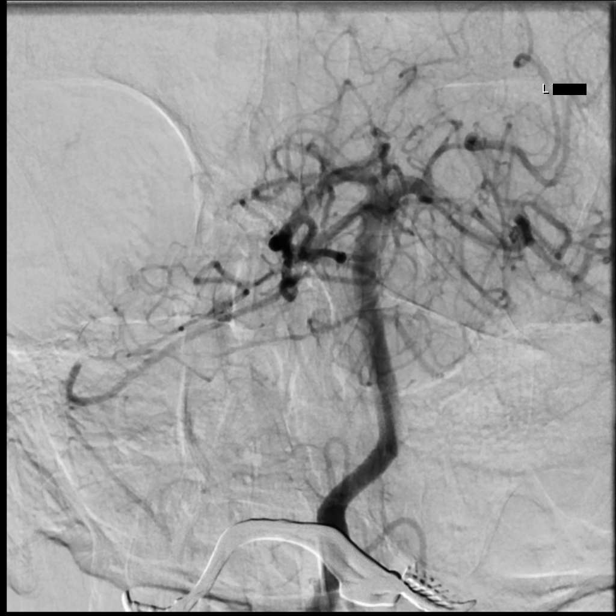

[Series 6: fl neuro n · 1 of 35 frames shown (2 of 2)]
[frame 30/35]
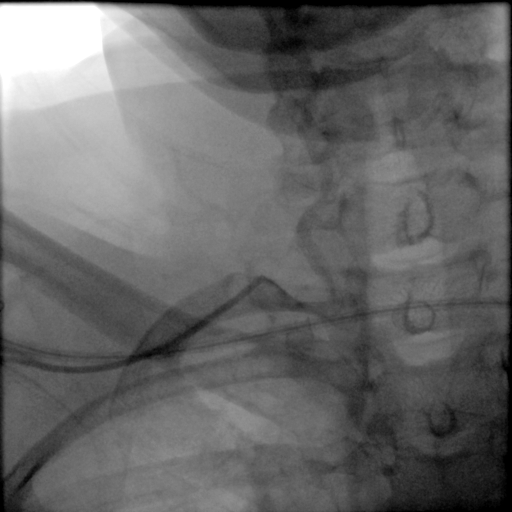

[Series 8: cerebral care 2 · 2 acquisitions, 1 frame shown (3 of 7)]
[im 1/2]
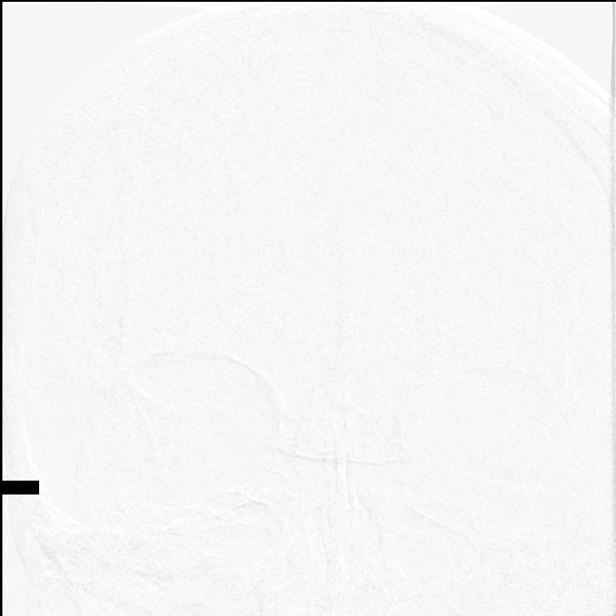

[Series 9: cerebral care 2 · 2 acquisitions, 1 frame shown (4 of 7)]
[im 1/2]
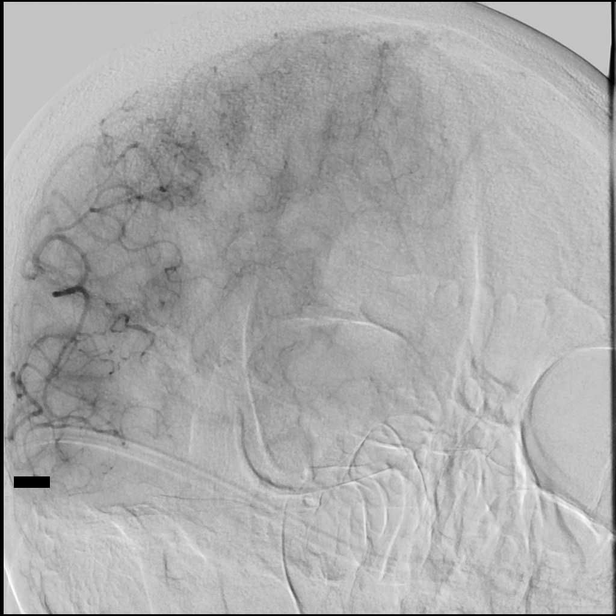

[Series 10: cerebral care 2 · 2 acquisitions, 1 frame shown (5 of 7)]
[im 1/2]
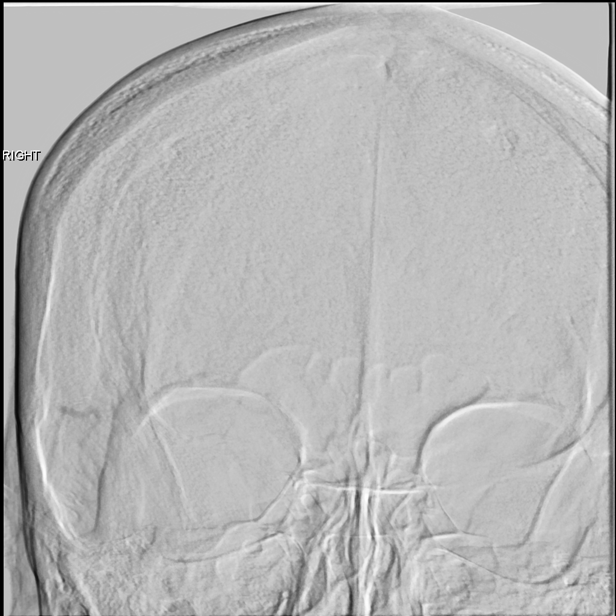

[Series 12: cerebral care 2 · 2 acquisitions, 1 frame shown (6 of 7)]
[im 1/2]
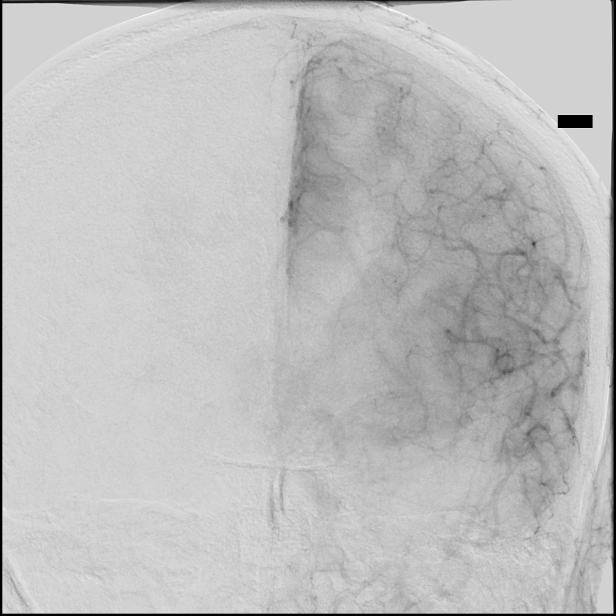

[Series 13: cerebral care 2 · 2 acquisitions, 1 frame shown (7 of 7)]
[im 1/2]
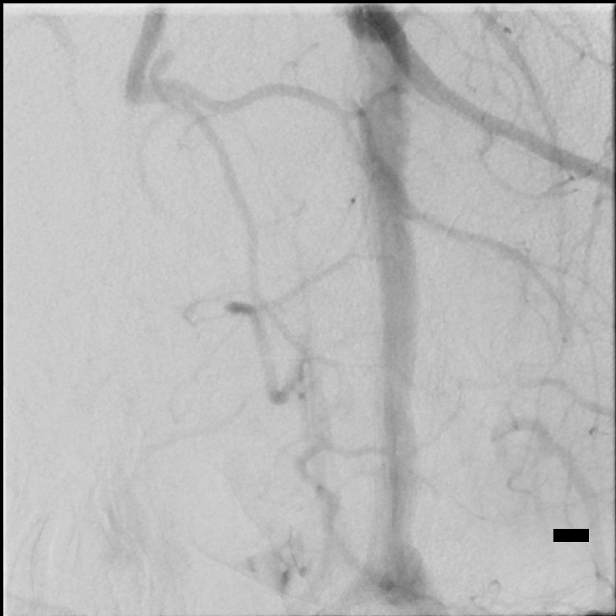

[Series 300: ir angio intra extracran sel com carotid · 2 of 34 slices shown]
[im 15/34]
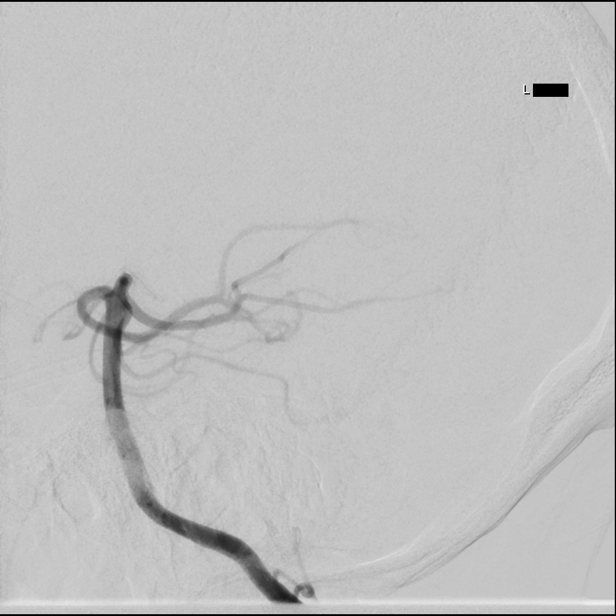
[im 34/34]
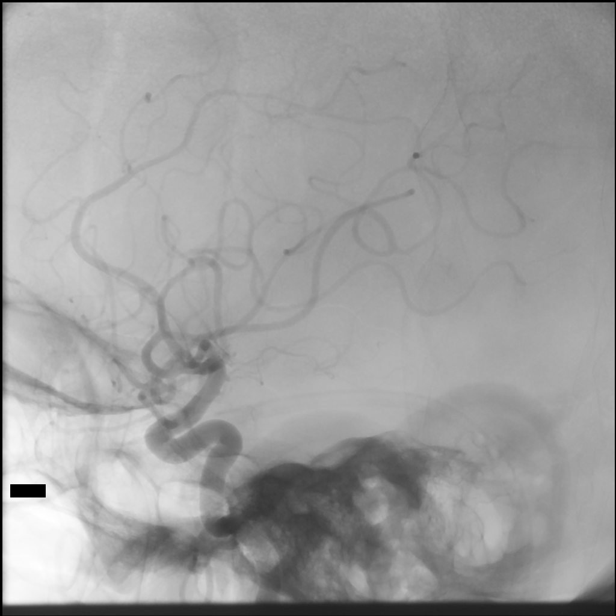

[12 of 24 positions shown; findings below may reference images not displayed]

MEDICATIONS:
5,000 FERIENHAUS heparin, 5 mg Verapamil and 200 mcg nitroglycerin

ANESTHESIA/SEDATION:
Versed 1 mg IV; Fentanyl 25 mcg IV

Moderate Sedation Time:  46 minutes

The patient was continuously monitored during the procedure by the
interventional radiology nurse under my direct supervision.

CONTRAST:  78 mL of Omnipaque 300 milligrams/mL

FLUOROSCOPY TIME:  Fluoroscopy Time: 11 minutes 6 seconds (331 mGy).

COMPLICATIONS:
None immediate.
Maximal Sterile Barrier Technique was utilized including caps, mask,
sterile gowns, sterile gloves, sterile drape, hand hygiene and skin
antiseptic. A timeout was performed prior to the initiation of the
procedure.

Using the modified Seldinger technique and a micropuncture kit,
access was gained to the distal right radial artery at the
anatomical snuffbox and a 5 French sheath was placed. Slow intra
arterial infusion of 5,000 FERIENHAUS heparin, 5 mg Verapamil and 200 mcg
nitroglycerin diluted in patient's own blood was performed. No
significant fluctuation in patient's blood pressure seen. Then, a
right radial artery roadmap was obtained via sheath side port.
Normal brachial artery branching pattern seen. No significant
anatomical variation. The right radial artery caliber is adequate
for vascular access.

Next, a 5 FERIENHAUS 2 glide catheter was navigated over a
0.035" Terumo Glidewire into the right subclavian artery under
fluoroscopic guidance. Under fluoroscopy the catheter was placed
into the right vertebral artery. Frontal, lateral and magnified
bilateral oblique views of the head were obtained.

The catheter was then placed into the right common carotid artery.
Frontal and lateral views of the neck were obtained. Under biplane
roadmap, the catheter was placed into the right internal carotid
artery. Frontal, lateral and magnified oblique views of the head
were obtained.

The catheter was subsequently placed into the right external carotid
artery. Frontal and lateral angiograms of the head were obtained.

Next, the catheter was placed into the left common carotid artery.
Frontal and lateral views of the neck were obtained followed by
frontal, lateral and magnified oblique views of the head. The
catheter was subsequently withdrawn.

An inflatable band was placed and inflated over the right hand
access site. The vascular sheath was withdrawn and the band was
slowly deflated until brisk flow was noted through the arteriotomy
site. At this point, the band was reinflated with additional 2 cc of
air to obtain patent hemostasis.
FINDINGS: Right radial artery ultrasound and right radial artery angiogram:
The caliber of the distal right radial artery is appropriate for
angiogram access. The right radial artery and the right ulnar artery
have normal course and caliber. No significant anatomical variants
noted.

Right vertebral artery angiograms: The right vertebral artery,
basilar artery, and bilateral posterior cerebral arteries are
unremarkable. The left vertebral artery seen by contrast reflux and
also appear unremarkable. Luminal caliber is smooth and tapering. No
aneurysms or abnormally high-flow, early draining veins are seen. No
regions of abnormal hypervascularity are noted. The visualized dural
sinuses are patent.

Right CCA angiograms: Cervical angiograms show normal course and
caliber of the visualized right common carotid and internal carotid
arteries. There are no significant stenoses.

Right ICA angiograms: There is brisk vascular contrast filling of
the ACA and MCA vascular trees. Luminal caliber is smooth and
tapering. No aneurysms or abnormally high-flow, early draining veins
are seen. No regions of abnormal hypervascularity are noted. The
visualized dural sinuses are patent.

Right ECA and right occipital angiograms: No early venous drainage
was noted. The visualized branches of the right external carotid
artery are unremarkable.

Left CCA angiograms: Cervical angiograms show normal caliber of the
visualized left common carotid and internal carotid arteries.
Increased tortuosity of the mid cervical segment of the left ICA
with mild luminal irregularity. There are no significant stenoses.

Left CCA angiograms - cranial views: There is brisk vascular
contrast filling of the ACA and MCA vascular trees. Luminal caliber
is smooth and tapering. No aneurysms or abnormally high-flow, early
draining veins are seen. No regions of abnormal hypervascularity are
noted. The visualized dural sinuses are patent. The visualized
branches of the left external carotid artery are unremarkable.

PROCEDURE:
No intervention performed.
IMPRESSION: 1. No angiographic evidence of vasculitis.
2. No significant intracranial stenosis, vasospasm, vascular
occlusion, aneurysm, AVM, dural AV fistula or other significant
vascular abnormality.

PLAN:
Results communicated to the neurology team.

## 2021-06-01 MED ORDER — IOHEXOL 300 MG/ML  SOLN
100.0000 mL | Freq: Once | INTRAMUSCULAR | Status: AC | PRN
  Administered 2021-06-01: 18 mL via INTRA_ARTERIAL

## 2021-06-01 MED ORDER — NITROGLYCERIN 1 MG/10 ML FOR IR/CATH LAB
INTRA_ARTERIAL | Status: AC
  Filled 2021-06-01: qty 10

## 2021-06-01 MED ORDER — SUMATRIPTAN SUCCINATE 100 MG PO TABS
100.0000 mg | ORAL_TABLET | ORAL | Status: DC | PRN
  Administered 2021-06-01 – 2021-06-05 (×4): 100 mg via ORAL
  Filled 2021-06-01 (×7): qty 1

## 2021-06-01 MED ORDER — HEPARIN SODIUM (PORCINE) 1000 UNIT/ML IJ SOLN
INTRAMUSCULAR | Status: AC
  Filled 2021-06-01: qty 1

## 2021-06-01 MED ORDER — LIDOCAINE HCL 1 % IJ SOLN
INTRAMUSCULAR | Status: AC
  Filled 2021-06-01: qty 20

## 2021-06-01 MED ORDER — FENTANYL CITRATE (PF) 100 MCG/2ML IJ SOLN
INTRAMUSCULAR | Status: AC | PRN
  Administered 2021-06-01: 25 ug via INTRAVENOUS

## 2021-06-01 MED ORDER — VERAPAMIL HCL 2.5 MG/ML IV SOLN
INTRAVENOUS | Status: AC
  Filled 2021-06-01: qty 2

## 2021-06-01 MED ORDER — VERAPAMIL HCL 2.5 MG/ML IV SOLN: INTRA_ARTERIAL | Status: AC | PRN

## 2021-06-01 MED ORDER — IOHEXOL 300 MG/ML  SOLN
150.0000 mL | Freq: Once | INTRAMUSCULAR | Status: AC | PRN
  Administered 2021-06-01: 60 mL via INTRA_ARTERIAL

## 2021-06-01 MED ORDER — MIDAZOLAM HCL 2 MG/2ML IJ SOLN
INTRAMUSCULAR | Status: AC
  Filled 2021-06-01: qty 2

## 2021-06-01 MED ORDER — FENTANYL CITRATE (PF) 100 MCG/2ML IJ SOLN
INTRAMUSCULAR | Status: AC
  Filled 2021-06-01: qty 2

## 2021-06-01 MED ORDER — ENOXAPARIN SODIUM 40 MG/0.4ML IJ SOSY
40.0000 mg | PREFILLED_SYRINGE | INTRAMUSCULAR | Status: DC
  Administered 2021-06-02 – 2021-06-08 (×7): 40 mg via SUBCUTANEOUS
  Filled 2021-06-01 (×6): qty 0.4

## 2021-06-01 MED ORDER — MIDAZOLAM HCL 2 MG/2ML IJ SOLN
INTRAMUSCULAR | Status: AC | PRN
  Administered 2021-06-01: 1 mg via INTRAVENOUS

## 2021-06-01 NOTE — Progress Notes (Signed)
Nutrition Follow-up  DOCUMENTATION CODES:   Not applicable  INTERVENTION:   Continue Ensure Enlive po BID, each supplement provides 350 kcal and 20 grams of protein. Discontinue Magic cup with meals, patient dislikes.  NUTRITION DIAGNOSIS:   Inadequate oral intake related to decreased appetite as evidenced by per patient/family report, meal completion < 25%.  Ongoing   GOAL:   Patient will meet greater than or equal to 90% of their needs  Progressing   MONITOR:   PO intake, Supplement acceptance, Labs, Weight trends, I & O's  REASON FOR ASSESSMENT:   Malnutrition Screening Tool    ASSESSMENT:   70 yo female with a PMH of HTN, prediabetes, anxiety and depression, CKD stage 3, remote hx of PUD, chronic low back pain, and migraines who presents with sepsis from an unknown origin.  Patient reports that she is eating better this week than she was last week. She drinks 1-2 vanilla Ensure supplements each day, but does not like the magic cup supplements.    Meal completion: 25-100%  S/P diagnostic cerebral angiogram today. Plans for TEE later this week.  Patient's husband requested information on a heart healthy diet. RD provided "Heart Healthy Nutrition Therapy" handout from the [employer].   Labs and medications reviewed.  NUTRITION - FOCUSED PHYSICAL EXAM:  Flowsheet Row Most Recent Value  Orbital Region No depletion  Upper Arm Region No depletion  Thoracic and Lumbar Region No depletion  Buccal Region No depletion  Temple Region No depletion  Clavicle Bone Region No depletion  Clavicle and Acromion Bone Region No depletion  Scapular Bone Region No depletion  Dorsal Hand No depletion  Patellar Region No depletion  Anterior Thigh Region No depletion  Posterior Calf Region No depletion  Edema (RD Assessment) None  Hair Reviewed  Eyes Reviewed  Mouth Reviewed  Skin Reviewed  Nails Reviewed       Diet Order:   Diet Order              Diet Heart Room service appropriate? Yes; Fluid consistency: Thin  Diet effective now                   EDUCATION NEEDS:   Education needs have been addressed  Skin:  Skin Assessment: Reviewed RN Assessment  Last BM:  7/2 type 6  Height:   Ht Readings from Last 1 Encounters:  05/24/21 5\' 3"  (1.6 m)    Weight:   Wt Readings from Last 1 Encounters:  05/24/21 60.8 kg    Ideal Body Weight:  52.3 kg  BMI:  Body mass index is 23.74 kg/m.  Estimated Nutritional Needs:   Kcal:  1700-1900  Protein:  80-95 grams  Fluid:  >1.7 L    Lucas Mallow, RD, LDN, CNSC Please refer to Amion for contact information.

## 2021-06-01 NOTE — Progress Notes (Signed)
Subjective: No significant changes  Exam: Vitals:   06/01/21 0500 06/01/21 0620  BP: (!) 156/95 (!) 156/95  Pulse: 81   Resp: 16   Temp: 97.8 F (36.6 C)   SpO2: 97%    Gen: In bed, NAD Resp: non-labored breathing, no acute distress Abd: soft, nt  Neuro: MS: awake, alert, oriented EO:FHQRF, VFF Motor: 5/5 on the right, 4/5 LUE, 4+/5 LLE Sensory:intact to LT   Pertinent Labs: CSF WBC 2 CSF RBC 26 CSF Protein 82 CSF glucose 52 CSF VDRL - pending CSF cytology - pending CSF flow cytometry - pending CSF oligoclonal bands - pending CSF mayo paraneoplstic panel - pending  ANA positive DSDNA - negative RF - negative Hepatitis antibody panel - negative RPR - non-reactive SSA,SSB - pending ANCA - pending C3,C4 - pending ACE - pending EBV Ab - pending  TSH normal B12 254 B1 normal HIV negative    CRP 6.8 ESR 37   Impression: 63 F with a history of hypertension who presented with left-sided weakness in the setting of fever.  MRI revealed right-sided stroke as well as left-sided T2 change of unclear etiology.  Work-up has proceeded as above.  Etiology continues to be unclear, possibilities including granulomatous disease, other inflammatory process, or neoplastic process.  With negative CSF I think that infectious process is much less likely.  Lymphomatoid granulomatosis (angiocentric lymphoma) or intravascular lymphoma I think would explain both stroke as well as the T2 change and therefore would be a unifying diagnosis.  Lymphoma can cause fevers as well.  Flow cytometry and cytology are pending on this.  I think another round of investigation given that vasculitis has been strongly considered would be a formal angiogram.  I have discussed the possibility of MR spectroscopy with several neuroradiologist, and it seems that it would be unlikely to yield additional information.  Steroids have been started empirically, and I think it is reasonable to continue this for a  pulse dose.  Also, would rule out marantic endocarditis, culture negative endocarditis or PFO with TEE.  Recommendations: 1) catheter angiogram 2) continue steroids for now 3) Epstein-Barr serology 4) TEE  5) follow-up pending testing as above  Roland Rack, MD [facility] [phone]  If 7pm- 7am, please page neurology on call as listed in [place].

## 2021-06-01 NOTE — Consult Note (Signed)
Referring Physician: Dr. Ladean Raya neuro interventional  Patient name: Michaela Rios MRN: [id number] DOB: [birth year] Sex: female  REASON FOR CONSULT: Hematoma right hand  HPI: Michaela Rios is a 70 y.o. female, who earlier today underwent cerebral angiography through a right radial approach.  She was noted to have swelling in her hand post procedure.  I was called to see and evaluate her for hematoma.  Patient is currently on Plavix and aspirin.  She had a stroke several days ago thought to be secondary to small vessel disease.  Her arteriogram was done for possible vasculitis.  The arteriogram was normal.  Other medical problems include hypertension which is currently controlled.  Past Medical History:  Diagnosis Date   Anemia    Anxiety    Arthritis    Cataract    Chronic migraine    follows with neuro for same   Clotting disorder (HCC)    clot in ovarian vein- hx   Esophagitis    GERD (gastroesophageal reflux disease)    Hypertension    IBS (irritable bowel syndrome)    Low back pain syndrome    MRSA (methicillin resistant Staphylococcus aureus) infection 04/17/12   left torso   Peptic ulcer disease 2003, 12/2012   Peripheral vascular disease ([place])    Vitamin D deficiency    Past Surgical History:  Procedure Laterality Date   ABDOMINAL HYSTERECTOMY     IR ANGIO EXTERNAL CAROTID SEL EXT CAROTID UNI R MOD SED  06/01/2021   IR ANGIO INTRA EXTRACRAN SEL COM CAROTID INNOMINATE UNI L MOD SED  06/01/2021   IR ANGIO INTRA EXTRACRAN SEL INTERNAL CAROTID UNI R MOD SED  06/01/2021   IR ANGIO VERTEBRAL SEL VERTEBRAL UNI R MOD SED  06/01/2021   IR US GUIDE VASC ACCESS RIGHT  06/01/2021   LAPAROSCOPIC BILATERAL SALPINGO OOPHERECTOMY     pud surgery w/duod sticture-plasty and vagotomy  2003   Dr. Marlou Starks   TONSILLECTOMY AND ADENOIDECTOMY     as a child   TUBAL LIGATION  1984    Family History  Problem Relation Age of Onset   Aneurysm Father    Prostate cancer Brother     Lung cancer Brother    Colon cancer Neg Hx    Esophageal cancer Neg Hx    Rectal cancer Neg Hx    Stomach cancer Neg Hx     SOCIAL HISTORY: Social History   Socioeconomic History   Marital status: Married    Spouse name: Levada Dy   Number of children: 1   Years of education: Not on file   Highest education level: Not on file  Occupational History   Occupation: retired  Tobacco Use   Smoking status: Never   Smokeless tobacco: Never  Vaping Use   Vaping Use: Never used  Substance and Sexual Activity   Alcohol use: Yes    Comment: rare   Drug use: No   Sexual activity: Yes  Other Topics Concern   Not on file  Social History Narrative   Lives with spouse    Grown son in [place], 2 g kids   Social Determinants of Health   Financial Resource Strain: Not on file  Food Insecurity: No Food Insecurity   Worried About Charity fundraiser in the Last Year: Never true   Arboriculturist in the Last Year: Never true  Transportation Needs: No Transportation Needs   Lack of Transportation (Medical): No   Lack of  Transportation (Non-Medical): No  Physical Activity: Inactive   Days of Exercise per Week: 0 days   Minutes of Exercise per Session: 0 min  Stress: Stress Concern Present   Feeling of Stress : Very much  Social Connections: Moderately Isolated   Frequency of Communication with Friends and Family: More than three times a week   Frequency of Social Gatherings with Friends and Family: Never   Attends Religious Services: Never   Marine scientist or Organizations: No   Attends Music therapist: Never   Marital Status: Married  Human resources officer Violence: Not on file    Allergies  Allergen Reactions   Amitriptyline Hcl     Changed personality   Shellfish Allergy Nausea And Vomiting and Other (See Comments)    Tested "high" on allergy test   Amoxicillin Hives   Cephalexin Hives   Lisinopril Cough    .   Penicillins Hives    Current  Facility-Administered Medications  Medication Dose Route Frequency Provider Last Rate Last Admin   acetaminophen (TYLENOL) tablet 650 mg  650 mg Oral Q6H PRN Orma Flaming, MD   650 mg at 05/30/21 0542   Or   acetaminophen (TYLENOL) suppository 650 mg  650 mg Rectal Q6H PRN Orma Flaming, MD       amLODipine (NORVASC) tablet 5 mg  5 mg Oral q AM Orma Flaming, MD   5 mg at 06/01/21 6301   aspirin EC tablet 81 mg  81 mg Oral Daily Rosalin Hawking, MD   81 mg at 05/31/21 1019   buPROPion (WELLBUTRIN XL) 24 hr tablet 300 mg  300 mg Oral Daily Orma Flaming, MD   300 mg at 05/31/21 1019   calcium carbonate (OS-CAL - dosed in mg of elemental calcium) tablet 500 mg of elemental calcium  1 tablet Oral Q breakfast Amie Portland, MD   500 mg of elemental calcium at 05/31/21 0820   cholecalciferol (VITAMIN D3) tablet 1,000 Units  1,000 Units Oral Daily Amie Portland, MD       clopidogrel (PLAVIX) tablet 75 mg  75 mg Oral Daily Rosalin Hawking, MD   75 mg at 06/01/21 1100   [START ON 06/02/2021] enoxaparin (LOVENOX) injection 40 mg  40 mg Subcutaneous Q24H Lyndee Leo, RPH       feeding supplement (ENSURE ENLIVE / ENSURE PLUS) liquid 237 mL  237 mL Oral BID BM Orma Flaming, MD   237 mL at 05/31/21 1525   heparin sodium (porcine) 1000 UNIT/ML injection            hydrALAZINE (APRESOLINE) tablet 25 mg  25 mg Oral BID Regalado, Belkys A, MD   25 mg at 06/01/21 1100   HYDROcodone-acetaminophen (NORCO) 7.5-325 MG per tablet 1 tablet  1 tablet Oral Q6H PRN Regalado, Belkys A, MD   1 tablet at 06/01/21 0348   lidocaine (XYLOCAINE) 1 % (with pres) injection            LORazepam (ATIVAN) tablet 1 mg  1 mg Oral BID PRN Orma Flaming, MD   1 mg at 05/31/21 2300   metoprolol succinate (TOPROL-XL) 24 hr tablet 100 mg  100 mg Oral Daily Orma Flaming, MD   100 mg at 06/01/21 1100   multivitamin with minerals tablet 1 tablet  1 tablet Oral Daily Regalado, Belkys A, MD   1 tablet at 05/31/21 1019   nitroGLYCERIN 100  mcg/mL intra-arterial injection            ondansetron (  ZOFRAN) tablet 4 mg  4 mg Oral Q6H PRN Orma Flaming, MD       pantoprazole (PROTONIX) EC tablet 40 mg  40 mg Oral Daily Amie Portland, MD   40 mg at 05/31/21 1019   SUMAtriptan (IMITREX) tablet 100 mg  100 mg Oral Q2H PRN Irene Pap N, DO   100 mg at 06/01/21 1110   tiZANidine (ZANAFLEX) tablet 4 mg  4 mg Oral BID PRN Orma Flaming, MD       topiramate (TOPAMAX) tablet 150 mg  150 mg Oral QHS Orma Flaming, MD   150 mg at 05/31/21 2153   venlafaxine XR (EFFEXOR-XR) 24 hr capsule 75 mg  75 mg Oral Q breakfast Orma Flaming, MD   75 mg at 05/31/21 0820   verapamil (ISOPTIN) 2.5 MG/ML injection            vitamin B-12 (CYANOCOBALAMIN) tablet 1,000 mcg  1,000 mcg Oral Daily Regalado, Belkys A, MD   1,000 mcg at 05/31/21 1019   zolpidem (AMBIEN) tablet 5 mg  5 mg Oral QHS PRN Orma Flaming, MD   5 mg at 05/31/21 2154    ROS:   General:  No weight loss, Fever, chills  HEENT: No recent headaches, no nasal bleeding, no visual changes, no sore throat  Neurologic: No dizziness, blackouts, seizures. No recent symptoms of stroke or mini- stroke. No recent episodes of slurred speech, or temporary blindness.  Cardiac: No recent episodes of chest pain/pressure, no shortness of breath at rest.  No shortness of breath with exertion.  Denies history of atrial fibrillation or irregular heartbeat  Vascular: No history of rest pain in feet.  No history of claudication.  No history of non-healing ulcer, No history of DVT   Pulmonary: No home oxygen, no productive cough, no hemoptysis,  No asthma or wheezing  Musculoskeletal:  [ ]  Arthritis, [ ]  Low back pain,  [ ]  Joint pain  Hematologic:No history of hypercoagulable state.  No history of easy bleeding.  No history of anemia  Gastrointestinal: No hematochezia or melena,  No gastroesophageal reflux, no trouble swallowing  Urinary: [ ]  chronic Kidney disease, [ ]  on HD - [ ]  MWF or [ ]  TTHS, [ ]   Burning with urination, [ ]  Frequent urination, [ ]  Difficulty urinating;   Skin: No rashes  Psychological:+history of anxiety,  No history of depression   Physical Examination  Vitals:   06/01/21 1420 06/01/21 1425 06/01/21 1459 06/01/21 1635  BP: (!) 151/95 (!) 151/93 (!) 157/93 (!) 145/93  Pulse: 77 75  (!) 103  Resp: 12 13    Temp:    97.9 F (36.6 C)  TempSrc:    Axillary  SpO2: 100% 100% 100% (!) 89%  Weight:      Height:        Body mass index is 23.74 kg/m.  General:  Alert and oriented, no acute distress HEENT: Normal Neck: No JVD Cardiac: Regular Rate and Rhythm Skin: No rash, mild ecchymosis dorsum of right hand Extremity Pulses: Patient has 2+ right brachial pulse radial pulse cannot be examined due to a TR band in place.  There is dorsal swelling of the right hand.  There is some mild edema of the palmar aspect of the hand.  There is no focal hematoma.  There is no pulsatility to the hematoma.  It is not expanding.  The palm structures are doughy and not tight.  There is no tightness in the forearm. Musculoskeletal: No deformity or  edema  Neurologic: Upper and lower extremity motor 5/5 and symmetric, specifically all of her hand intrinsic muscles are intact with 5/5 strength.  She has no numbness or tingling in her digits.  DATA:  Patient had a duplex ultrasound which did not show a frank pseudoaneurysm but may be a small flow channel.  There was no frank hematoma.  ASSESSMENT: Hematoma right hand status post radial artery catheterization, currently hand is not ischemic there is no evidence of hand compartment syndrome which would be exceedingly rare.  Patient currently has no neurologic or vascular compromise.   PLAN: Continued observation.  TR band will be managed by the interventional radiology staff.  I will recheck her tomorrow morning.  Advised patient if she has increasing pain loss of sensation or loss of motor function weakness in the hand she should  contact her nurse and they can page me this evening.   Ruta Hinds, MD [facility] Office: [phone]

## 2021-06-01 NOTE — Progress Notes (Signed)
PT Cancellation Note  Patient Details Name: EMERIE VANDERKOLK MRN: [id number] DOB: Nov 30, 1950   Cancelled Treatment:    Reason Eval/Treat Not Completed: Patient declined, states she  is goingh for  a test and wants to wait on PT.  Will check back another time.  Claretha Cooper 06/01/2021, 9:13 AM [place]  Office [phone]

## 2021-06-01 NOTE — Progress Notes (Signed)
Patient returned from IR, TR band on with 5 mls in band. Small hematoma noted at edge of band. Site checks with no change. 1600 Patient noted to have severe swelling in dorsal hand area and palm. Patient complained of pain, hand color dusky, good capillary refill. Extremity elevated.Notified Dr. Karenann Cai. 5 mls removed slowly with no bleeding from insertion site and band removed as discussed with Dr. Debbrah Alar.Dr. Debbrah Alar into see patient see her note.  1614 16 mls of air injected. Noted bleeding and radiology team removed, cleaned and replaced with 18 mls of air at 1618.  Dr. Tyrell Antonio updated on patients plan of care and Dr Debbrah Alar care at bedside Measured when pressure held and just after band applied and circumference measurement was 21.5 cm. Rechecked at 1700 21.5, 1800 21.0 cm and at 1900 20.5 cms.  Started deflating at 1820, 2 mls, 1840 2 mls and 1900 2 mls. No change noted with deflation. Circumference decreasing and palm no longer firm but soft with palpation. Patient denies any pain at this time. Report to [employer].

## 2021-06-01 NOTE — Sedation Documentation (Signed)
Right wrist TR band applied with 5 cc air at 1421

## 2021-06-01 NOTE — Consult Note (Signed)
Chief Complaint: Patient was seen in consultation today for diagnostic cerebral arteriogram Chief Complaint  Patient presents with   Urinary Frequency    Referring Physician(s): Poyen  Supervising Physician: Pedro Earls  Patient Status: [facility] - In-pt  History of Present Illness: Michaela Rios is a 70 y.o. female with past medical history of anemia, anxiety, chronic migraines, prior ovarian vein clot, esophagitis/GERD, hypertension, prediabetes, ?chronic kidney disease, IBS, prior MRSA in 2013, prior shingles, peptic ulcer disease, peripheral vascular disease, vitamin D deficiency.  She was admitted to [facility] on 6/27 with generalized weakness fever, chills, loss of balance as well as left-sided weakness.  Imaging has revealed no PE, 4 mm right middle lobe peripheral nodular opacity, mild largely vascular/perivascular appearing enhancement and areas of signal abnormality in the deep gray nuclei as well as a small amount of leptomeningeal enhancement over the right and possible left parietal convexities.  Also present was restricted diffusion in the right basal ganglia corresponding to lenticulostriate territory most consistent with acute infarction.  CT angio head and neck revealed minimal atherosclerosis in the head neck without large vessel occlusion or significant stenosis.  She was COVID-19 negative.  Blood cultures, CSF cx negative to date.  Due to concern for possible vasculitis request now received from neurology for diagnostic cerebral arteriogram.  She is currently on Lovenox, Plavix, aspirin and Solu-Medrol.  Current labs include potassium 3.5, creatinine 0.58, WBC normal, hemoglobin 10.4, platelets 284k.  Past Medical History:  Diagnosis Date   Anemia    Anxiety    Arthritis    Cataract    Chronic migraine    follows with neuro for same   Clotting disorder (HCC)    clot in ovarian vein- hx   Esophagitis    GERD (gastroesophageal  reflux disease)    Hypertension    IBS (irritable bowel syndrome)    Low back pain syndrome    MRSA (methicillin resistant Staphylococcus aureus) infection 04/17/12   left torso   Peptic ulcer disease 2003, 12/2012   Peripheral vascular disease ([place])    Vitamin D deficiency     Past Surgical History:  Procedure Laterality Date   ABDOMINAL HYSTERECTOMY     LAPAROSCOPIC BILATERAL SALPINGO OOPHERECTOMY     pud surgery w/duod sticture-plasty and vagotomy  2003   Dr. Marlou Starks   TONSILLECTOMY AND ADENOIDECTOMY     as a child   TUBAL LIGATION  1984    Allergies: Amitriptyline hcl, Shellfish allergy, Amoxicillin, Cephalexin, Lisinopril, and Penicillins  Medications: Prior to Admission medications   Medication Sig Start Date End Date Taking? Authorizing Provider  acetaminophen (TYLENOL) 650 MG CR tablet Take 650-1,300 mg by mouth every 8 (eight) hours as needed for pain.   Yes [provider]  amLODipine (NORVASC) 5 MG tablet TAKE ONE TABLET BY MOUTH EVERY MORNING Patient taking differently: Take 5 mg by mouth in the morning. 11/19/20  Yes Burns, Claudina Lick, MD  buPROPion (WELLBUTRIN XL) 300 MG 24 hr tablet TAKE ONE TABLET BY MOUTH EVERY MORNING Patient taking differently: Take 300 mg by mouth in the morning. 01/29/21  Yes Burns, Claudina Lick, MD  Cholecalciferol (VITAMIN D3 PO) Take 1 tablet by mouth daily.   Yes [provider]  EPINEPHrine (EPIPEN 2-PAK) 0.3 mg/0.3 mL IJ SOAJ injection Inject 0.3 mLs (0.3 mg total) into the muscle as needed for anaphylaxis. 08/15/19  Yes Burns, Claudina Lick, MD  furosemide (LASIX) 20 MG tablet TAKE ONE TABLET BY MOUTH EVERY MORNING  Patient taking differently: Take 20 mg by mouth in the morning. 04/19/21  Yes Burns, Claudina Lick, MD  hydrALAZINE (APRESOLINE) 25 MG tablet TAKE ONE TABLET BY MOUTH EVERY MORNING and TAKE ONE TABLET BY MOUTH EVERYDAY AT BEDTIME Patient taking differently: Take 25 mg by mouth in the morning and at bedtime. 01/29/21  Yes Burns, Claudina Lick,  MD  LORazepam (ATIVAN) 1 MG tablet Take 1 tablet (1 mg total) by mouth 2 (two) times daily as needed. 04/29/21  Yes Burns, Claudina Lick, MD  metoprolol succinate (TOPROL-XL) 100 MG 24 hr tablet TAKE ONE TABLET BY MOUTH EVERY MORNING WITH OR immediately following A meal Patient taking differently: Take 100 mg by mouth daily with breakfast. 04/19/21  Yes Burns, Claudina Lick, MD  ondansetron (ZOFRAN) 4 MG tablet Take 1 tablet (4 mg total) by mouth every 6 (six) hours as needed for nausea. 08/19/20  Yes Pokhrel, Corrie Mckusick, MD  SUMAtriptan (IMITREX) 100 MG tablet Take one tablet (100 mg) by mouth at onset of migraine headache, may repeat in 2 hours if still needed Patient taking differently: Take 100 mg by mouth See admin instructions. Take one tablet (100 mg) by mouth at onset of migraine headache, may repeat in 2 hours if still needed 04/07/21  Yes Burns, Claudina Lick, MD  tiZANidine (ZANAFLEX) 4 MG tablet TAKE ONE TABLET BY MOUTH twice daily AS NEEDED FOR muscle SPASMS Patient taking differently: Take 4 mg by mouth 2 (two) times daily as needed for muscle spasms. 05/03/21  Yes Burns, Claudina Lick, MD  topiramate (TOPAMAX) 100 MG tablet Take 1.5 tablets (150 mg total) by mouth at bedtime. 02/02/21  Yes Burns, Claudina Lick, MD  venlafaxine XR (EFFEXOR-XR) 75 MG 24 hr capsule TAKE THREE CAPSULES BY MOUTH EVERY MORNING Patient taking differently: Take 75 mg by mouth daily with breakfast. 01/29/21  Yes Burns, Claudina Lick, MD  zolpidem (AMBIEN) 10 MG tablet TAKE ONE TABLET BY MOUTH EVERYDAY AT BEDTIME AS NEEDED FOR SLEEP Patient taking differently: Take 10 mg by mouth at bedtime. 04/29/21  Yes Burns, Claudina Lick, MD  HYDROcodone-acetaminophen (NORCO) 7.5-325 MG tablet Take 1 tablet by mouth 2 (two) times daily as needed (migraine headache). 05/25/21   Binnie Rail, MD  nitrofurantoin, macrocrystal-monohydrate, (MACROBID) 100 MG capsule Take 1 capsule (100 mg total) by mouth 2 (two) times daily. Patient not taking: No sig reported 04/09/21   Binnie Rail,  MD  omeprazole (PRILOSEC) 20 MG capsule Take 1 capsule (20 mg total) by mouth daily. Patient not taking: No sig reported 05/15/20   Binnie Rail, MD     Family History  Problem Relation Age of Onset   Aneurysm Father    Prostate cancer Brother    Lung cancer Brother    Colon cancer Neg Hx    Esophageal cancer Neg Hx    Rectal cancer Neg Hx    Stomach cancer Neg Hx     Social History   Socioeconomic History   Marital status: Married    Spouse name: Levada Dy   Number of children: 1   Years of education: Not on file   Highest education level: Not on file  Occupational History   Occupation: retired  Tobacco Use   Smoking status: Never   Smokeless tobacco: Never  Vaping Use   Vaping Use: Never used  Substance and Sexual Activity   Alcohol use: Yes    Comment: rare   Drug use: No   Sexual activity: Yes  Other Topics Concern  Not on file  Social History Narrative   Lives with spouse    Grown son in [place], 2 g kids   Social Determinants of Health   Financial Resource Strain: Not on file  Food Insecurity: No Food Insecurity   Worried About Charity fundraiser in the Last Year: Never true   Arboriculturist in the Last Year: Never true  Transportation Needs: No Transportation Needs   Lack of Transportation (Medical): No   Lack of Transportation (Non-Medical): No  Physical Activity: Inactive   Days of Exercise per Week: 0 days   Minutes of Exercise per Session: 0 min  Stress: Stress Concern Present   Feeling of Stress : Very much  Social Connections: Moderately Isolated   Frequency of Communication with Friends and Family: More than three times a week   Frequency of Social Gatherings with Friends and Family: Never   Attends Religious Services: Never   Marine scientist or Organizations: No   Attends Music therapist: Never   Marital Status: Married     Review of Systems see above; currently denies headache but did have a right retro-orbital headache  yesterday evening.  Denies chest pain, dyspnea, cough, abdominal/back pain, nausea, vomiting or bleeding.  Also denies visual difficulties, speech difficulties, extremity paresthesias. She does report difficulty with ambulating without use of walker  Vital Signs: BP (!) 156/95   Pulse 81   Temp 97.8 F (36.6 C) (Oral)   Resp 16   Ht 5\' 3"  (1.6 m)   Wt 134 lb (60.8 kg)   SpO2 97%   BMI 23.74 kg/m   Physical Exam awake/alert; chest- CTA bilat; heart- nl rate, occ ectopy noted; abd- soft,+BS,NT; no LE edema, intact pulses; motor 5/5 on right; 4/5 LUE,4/5 LLE; sens intact, face symm, tongue midline  Imaging: CT ANGIO HEAD W OR WO CONTRAST  Result Date: 05/26/2021 CLINICAL DATA:  Stroke suspected. EXAM: CT ANGIOGRAPHY HEAD AND NECK TECHNIQUE: Multidetector CT imaging of the head and neck was performed using the standard protocol during bolus administration of intravenous contrast. Multiplanar CT image reconstructions and MIPs were obtained to evaluate the vascular anatomy. Carotid stenosis measurements (when applicable) are obtained utilizing NASCET criteria, using the distal internal carotid diameter as the denominator. CONTRAST:  45mL OMNIPAQUE IOHEXOL 350 MG/ML SOLN COMPARISON:  None. FINDINGS: CTA NECK FINDINGS Aortic arch: Standard 3 vessel aortic arch with mild atherosclerotic plaque. Widely patent arch vessel origins. Right carotid system: Patent without evidence of stenosis, dissection, or significant atherosclerosis. Left carotid system: Patent with minimal calcified plaque at the carotid bifurcation. No evidence of dissection or stenosis. Tortuous mid cervical ICA. Vertebral arteries: Patent with the right being slightly larger than the left. No evidence of dissection or stenosis. Skeleton: Advanced disc degeneration at C5-6. Advanced facet arthrosis at C4-5 with grade 1 anterolisthesis. Other neck: No evidence of cervical lymphadenopathy or mass. Upper chest: No apical lung consolidation or  mass. Review of the MIP images confirms the above findings CTA HEAD FINDINGS Anterior circulation: The internal carotid arteries are patent from skull base to carotid termini with minimal nonstenotic plaque. ACAs and MCAs are patent without evidence of a proximal branch occlusion or significant proximal stenosis. No aneurysm is identified. Posterior circulation: The intracranial vertebral arteries are widely patent to the basilar. Patent bilateral PICA, left AICA, and bilateral SCA origins are visualized. A right AICA is not clearly identified. The basilar artery is widely patent. There are small posterior communicating arteries bilaterally.  Both PCAs are patent without evidence of a significant proximal stenosis. No aneurysm is identified. Venous sinuses: As permitted by contrast timing, patent. Anatomic variants: None. Review of the MIP images confirms the above findings IMPRESSION: 1. Minimal atherosclerosis in the head and neck without large vessel occlusion or significant stenosis. 2. Aortic Atherosclerosis (ICD10-I70.0). Electronically Signed   By: Logan Bores M.D.   On: 05/26/2021 20:00   DG Chest 2 View  Result Date: 05/24/2021 CLINICAL DATA:  Fever and urinary frequency EXAM: CHEST - 2 VIEW COMPARISON:  08/17/2020 FINDINGS: The heart size and mediastinal contours are within normal limits. Both lungs are clear. The visualized skeletal structures are unremarkable. IMPRESSION: No active cardiopulmonary disease. Electronically Signed   By: Franchot Gallo M.D.   On: 05/24/2021 14:41   CT HEAD WO CONTRAST  Result Date: 05/26/2021 CLINICAL DATA:  Headache. EXAM: CT HEAD WITHOUT CONTRAST TECHNIQUE: Contiguous axial images were obtained from the base of the skull through the vertex without intravenous contrast. COMPARISON:  08/17/2020 FINDINGS: Brain: There is new multifocal hypoattenuation in the basal ganglia and thalami bilaterally. A band of low-density involving the right caudate and lentiform nuclei  has an appearance suggestive of an acute lateral lenticulostriate territory infarct. No acute cortically based infarct, intracranial hemorrhage, midline shift, or extra-axial fluid collection is identified. Hypodensities in the periventricular white matter bilaterally are nonspecific but compatible with mild chronic small vessel ischemic disease. Vascular: No hyperdense vessel. Skull: No fracture or suspicious osseous lesion. Sinuses/Orbits: Visualized paranasal sinuses and mastoid air cells are clear. Unremarkable orbits. Other: None. IMPRESSION: New multifocal hypoattenuation in the basal ganglia and thalami bilaterally of indeterminate etiology. Considerations include multifocal infarcts (including venous infarcts), toxic/metabolic insult, hypoxic-ischemic injury, and infection. No mass effect to suggest tumor. Correlate with pending MRI. Electronically Signed   By: Logan Bores M.D.   On: 05/26/2021 15:20   CT ANGIO NECK W OR WO CONTRAST  Result Date: 05/26/2021 CLINICAL DATA:  Stroke suspected. EXAM: CT ANGIOGRAPHY HEAD AND NECK TECHNIQUE: Multidetector CT imaging of the head and neck was performed using the standard protocol during bolus administration of intravenous contrast. Multiplanar CT image reconstructions and MIPs were obtained to evaluate the vascular anatomy. Carotid stenosis measurements (when applicable) are obtained utilizing NASCET criteria, using the distal internal carotid diameter as the denominator. CONTRAST:  12mL OMNIPAQUE IOHEXOL 350 MG/ML SOLN COMPARISON:  None. FINDINGS: CTA NECK FINDINGS Aortic arch: Standard 3 vessel aortic arch with mild atherosclerotic plaque. Widely patent arch vessel origins. Right carotid system: Patent without evidence of stenosis, dissection, or significant atherosclerosis. Left carotid system: Patent with minimal calcified plaque at the carotid bifurcation. No evidence of dissection or stenosis. Tortuous mid cervical ICA. Vertebral arteries: Patent with the  right being slightly larger than the left. No evidence of dissection or stenosis. Skeleton: Advanced disc degeneration at C5-6. Advanced facet arthrosis at C4-5 with grade 1 anterolisthesis. Other neck: No evidence of cervical lymphadenopathy or mass. Upper chest: No apical lung consolidation or mass. Review of the MIP images confirms the above findings CTA HEAD FINDINGS Anterior circulation: The internal carotid arteries are patent from skull base to carotid termini with minimal nonstenotic plaque. ACAs and MCAs are patent without evidence of a proximal branch occlusion or significant proximal stenosis. No aneurysm is identified. Posterior circulation: The intracranial vertebral arteries are widely patent to the basilar. Patent bilateral PICA, left AICA, and bilateral SCA origins are visualized. A right AICA is not clearly identified. The basilar artery is widely patent. There  are small posterior communicating arteries bilaterally. Both PCAs are patent without evidence of a significant proximal stenosis. No aneurysm is identified. Venous sinuses: As permitted by contrast timing, patent. Anatomic variants: None. Review of the MIP images confirms the above findings IMPRESSION: 1. Minimal atherosclerosis in the head and neck without large vessel occlusion or significant stenosis. 2. Aortic Atherosclerosis (ICD10-I70.0). Electronically Signed   By: Logan Bores M.D.   On: 05/26/2021 20:00   CT Angio Chest PE W/Cm &/Or Wo Cm  Result Date: 05/24/2021 CLINICAL DATA:  Elevated D-dimer, tachycardia EXAM: CT ANGIOGRAPHY CHEST WITH CONTRAST TECHNIQUE: Multidetector CT imaging of the chest was performed using the standard protocol during bolus administration of intravenous contrast. Multiplanar CT image reconstructions and MIPs were obtained to evaluate the vascular anatomy. CONTRAST:  16mL OMNIPAQUE IOHEXOL 350 MG/ML SOLN COMPARISON:  None. FINDINGS: Cardiovascular: Normal heart size. No pericardial effusion. Thoracic  aorta is normal in caliber minor calcified plaque. Satisfactory opacification of the pulmonary arteries to the proximal segmental level. No evidence of acute pulmonary embolism. Mediastinum/Nodes: No enlarged mediastinal or hilar lymph nodes. Mildly prominent but nonenlarged axillary and subpectoral lymph nodes. Lungs/Pleura: No pleural effusion or pneumothorax. There is a 4 mm peripheral nodular opacity of the right middle lobe (series 7, image 58). Upper Abdomen: No acute abnormality. Postoperative changes at the gastroesophageal junction. Musculoskeletal: Degenerative changes of the spine. No acute osseous abnormality. Review of the MIP images confirms the above findings. IMPRESSION: No evidence of acute pulmonary embolism or other acute abnormality. 4 mm right middle lobe peripheral nodular opacity. No follow-up needed if patient is low-risk. Non-contrast chest CT can be considered in 12 months if patient is high-risk. This recommendation follows the consensus statement: Guidelines for Management of Incidental Pulmonary Nodules Detected on CT Images: From the Fleischner Society 2017; Radiology 2017; 284:228-243. Electronically Signed   By: Macy Mis M.D.   On: 05/24/2021 16:41   MR BRAIN WO CONTRAST  Result Date: 05/26/2021 CLINICAL DATA:  Acute neuro deficit.  Abnormal CT.  Fever EXAM: MRI HEAD WITHOUT CONTRAST TECHNIQUE: Multiplanar, multiecho pulse sequences of the brain and surrounding structures were obtained without intravenous contrast. COMPARISON:  CT head 02/29/2022, 08/17/2020 FINDINGS: Brain: Focal area of restricted diffusion in the right lenticulostriate distribution compatible with acute infarct. This area also shows increased signal on T2 and FLAIR. Mild edema in the deep white matter tracts on the right. Small amount of FLAIR hyperintensity right medial thalamus. Additional areas of increased signal on T2 and FLAIR involving the head of caudate on the left and left thalamus without  restricted diffusion. There is mild enlargement of the head of the caudate on the left. There is also asymmetric white matter hyperintensity in the deep white matter tracks of the left external capsule and left internal capsule. These areas do not show restricted diffusion. Ventricle size normal.  No midline shift.  Negative for hemorrhage Vascular: Normal arterial flow voids. Skull and upper cervical spine: Negative Sinuses/Orbits: Minimal mucosal edema paranasal sinuses. Negative orbit Other: None IMPRESSION: Restricted diffusion in the right basal ganglia corresponding to lenticulostriate territory. This is most consistent with acute infarction Hyperintensity and mild enlargement of the left head of caudate. Hyperintensity also in the left thalamus and a small amount of hyperintensity in the right medial thalamus. There is vasogenic type edema in the deep white matter structures on the left and to lesser extent in the deep white matter structures on the right. No restricted diffusion on the left. This is an  unusual pattern. I would recommend postcontrast imaging of the brain to rule out tumor on the left. Subacute ischemia on the left appears unlikely given the vasogenic edema and unusual appearance of the head of the caudate and thalamus on the left. Also consider infection/cerebritis. Opportunistic infection such as toxoplasmosis possible. These results were called by telephone at the time of interpretation on 05/26/2021 at 4:10 pm to provider [facility], who verbally acknowledged these results. Electronically Signed   By: Franchot Gallo M.D.   On: 05/26/2021 16:10   MR BRAIN W CONTRAST  Result Date: 05/26/2021 CLINICAL DATA:  Follow-up abnormal brain MRI earlier today. EXAM: MRI HEAD WITH CONTRAST TECHNIQUE: Multiplanar, multiecho pulse sequences of the brain and surrounding structures were obtained with intravenous contrast. CONTRAST:  6.45mL GADAVIST GADOBUTROL 1 MMOL/ML IV SOLN COMPARISON:  Noncontrast brain  MRI earlier today FINDINGS: There is very mild enhancement within the previously seen regions of signal abnormality in the basal ganglia and thalami. Much of this enhancement appears vascular and perivascular. No solid enhancing mass is present. There is focal abnormal leptomeningeal enhancement over the right parietal convexity (series 6, image 6), and there may also be minimal leptomeningeal enhancement over the left parietal convexity (series 6, image 19). IMPRESSION: Mild, largely vascular/perivascular appearing enhancement in the areas of signal abnormality in the deep gray nuclei as well as a small amount of leptomeningeal enhancement over the right and possibly left parietal convexities. No solid enhancing mass. Differential diagnosis includes infection (including cryptococcus), vasculitis, unusual neoplasm (such as angiocentric lymphoma), and granulomatous/autoimmune/other inflammatory conditions. Electronically Signed   By: Logan Bores M.D.   On: 05/26/2021 20:30   CT ABDOMEN PELVIS W CONTRAST  Result Date: 05/25/2021 CLINICAL DATA:  Abdominal pain, fever EXAM: CT ABDOMEN AND PELVIS WITH CONTRAST TECHNIQUE: Multidetector CT imaging of the abdomen and pelvis was performed using the standard protocol following bolus administration of intravenous contrast. CONTRAST:  11mL OMNIPAQUE IOHEXOL 300 MG/ML  SOLN COMPARISON:  05/24/2021 FINDINGS: Lower chest: No acute abnormality. Hepatobiliary: Diffuse low-density throughout the liver compatible with fatty infiltration. No focal abnormality. Gallbladder unremarkable. Pancreas: No focal abnormality or ductal dilatation. Spleen: No focal abnormality.  Normal size. Adrenals/Urinary Tract: No adrenal abnormality. No focal renal abnormality. No stones or hydronephrosis. Urinary bladder is unremarkable. Stomach/Bowel: Normal appendix. Stomach, large and small bowel grossly unremarkable. Vascular/Lymphatic: No evidence of aneurysm or adenopathy. Reproductive: Prior  hysterectomy.  No adnexal masses. Other: No free fluid or free air. Musculoskeletal: No acute bony abnormality. IMPRESSION: Hepatic steatosis. No acute findings in the abdomen or pelvis. Electronically Signed   By: Rolm Baptise M.D.   On: 05/25/2021 19:07   EEG adult  Result Date: 05/27/2021 Lora Havens, MD     05/27/2021  6:42 PM Patient Name: Michaela Rios MRN: [id number] Epilepsy Attending: Lora Havens Referring Physician/Provider: Dr Rosalin Hawking Date: 05/27/2021 Duration: 24.11 mins Patient history: 70yo F presented with falling at home, generalized weakness, fever, difficult waking with imbalance, lack of appetite. MRI showed hyperintensity and mild enlargement of the left head of caudate. EEG to evaluate for seizure Level of alertness: Awake AEDs during EEG study: TPM Technical aspects: This EEG study was done with scalp electrodes positioned according to the 10-20 International system of electrode placement. Electrical activity was acquired at a sampling rate of 500Hz  and reviewed with a high frequency filter of 70Hz  and a low frequency filter of 1Hz . EEG data were recorded continuously and digitally stored. Description: The posterior dominant rhythm consists of  9 Hz activity of moderate voltage (25-35 uV) seen predominantly in posterior head regions, symmetric and reactive to eye opening and eye closing. EEG showed intermittent 3 to 6 Hz theta-delta slowing in left temporal  region. Single sharp transient was seen in left temporal region. Hyperventilation and photic stimulation were not performed.   ABNORMALITY - Intermittent slow, generalized IMPRESSION: This study is suggestive of cortical dysfunction arising from left temporal region, nonspecific etiology. No seizures or definite epileptiform discharges were seen throughout the recording. If suspicion for interictal activity remains a concern, a prolonged study including sleep should be considered. Lora Havens   ECHOCARDIOGRAM  COMPLETE  Result Date: 05/26/2021    ECHOCARDIOGRAM REPORT   Patient Name:   Michaela Rios [facility] Date of Exam: 05/26/2021 Medical Rec #:  [id number]         Height:       63.0 in Accession #:    [id number]        Weight:       134.0 lb Date of Birth:  26-Jul-1951         BSA:          1.631 m Patient Age:    40 years          BP:           140/97 mmHg Patient Gender: F                 HR:           94 bpm. Exam Location:  Inpatient Procedure: 2D Echo, Cardiac Doppler and Color Doppler Indications:    Stroke  History:        Patient has no prior history of Echocardiogram examinations,                 most recent 08/18/2020. Risk Factors:Hypertension. CKD. Sepsis.  Sonographer:    Clayton Lefort RDCS (AE) Referring Phys: [id number] BELKYS A REGALADO IMPRESSIONS  1. Left ventricular ejection fraction, by estimation, is 60 to 65%. The left ventricle has normal function. The left ventricle has no regional wall motion abnormalities. There is moderate left ventricular hypertrophy. Left ventricular diastolic parameters are consistent with Grade I diastolic dysfunction (impaired relaxation).  2. Right ventricular systolic function is normal. The right ventricular size is normal. Tricuspid regurgitation signal is inadequate for assessing PA pressure.  3. The mitral valve is normal in structure. Mild mitral valve regurgitation. No evidence of mitral stenosis.  4. The aortic valve is grossly normal. There is mild calcification of the aortic valve. Aortic valve regurgitation is not visualized. Mild aortic valve sclerosis is present, with no evidence of aortic valve stenosis.  5. The inferior vena cava is normal in size with greater than 50% respiratory variability, suggesting right atrial pressure of 3 mmHg. Conclusion(s)/Recommendation(s): No intracardiac source of embolism detected on this transthoracic study. A transesophageal echocardiogram is recommended to exclude cardiac source of embolism if clinically indicated. FINDINGS  Left  Ventricle: Left ventricular ejection fraction, by estimation, is 60 to 65%. The left ventricle has normal function. The left ventricle has no regional wall motion abnormalities. The left ventricular internal cavity size was normal in size. There is  moderate left ventricular hypertrophy. Left ventricular diastolic parameters are consistent with Grade I diastolic dysfunction (impaired relaxation). Right Ventricle: The right ventricular size is normal. No increase in right ventricular wall thickness. Right ventricular systolic function is normal. Tricuspid regurgitation signal is inadequate for assessing PA pressure. Left Atrium: Left atrial size  was normal in size. Right Atrium: Right atrial size was normal in size. Pericardium: There is no evidence of pericardial effusion. Mitral Valve: The mitral valve is normal in structure. Mild mitral valve regurgitation. No evidence of mitral valve stenosis. MV peak gradient, 2.9 mmHg. The mean mitral valve gradient is 1.0 mmHg. Tricuspid Valve: The tricuspid valve is normal in structure. Tricuspid valve regurgitation is not demonstrated. No evidence of tricuspid stenosis. Aortic Valve: The aortic valve is grossly normal. There is mild calcification of the aortic valve. Aortic valve regurgitation is not visualized. Mild aortic valve sclerosis is present, with no evidence of aortic valve stenosis. Aortic valve mean gradient  measures 3.0 mmHg. Aortic valve peak gradient measures 5.8 mmHg. Aortic valve area, by VTI measures 2.97 cm. Pulmonic Valve: The pulmonic valve was normal in structure. Pulmonic valve regurgitation is trivial. No evidence of pulmonic stenosis. Aorta: The aortic root is normal in size and structure. Venous: The inferior vena cava is normal in size with greater than 50% respiratory variability, suggesting right atrial pressure of 3 mmHg. IAS/Shunts: No atrial level shunt detected by color flow Doppler.  LEFT VENTRICLE PLAX 2D LVIDd:         3.40 cm   Diastology LVIDs:         2.20 cm  LV e' medial:    8.70 cm/s LV PW:         1.30 cm  LV E/e' medial:  7.3 LV IVS:        1.40 cm  LV e' lateral:   13.20 cm/s LVOT diam:     2.00 cm  LV E/e' lateral: 4.8 LV SV:         65 LV SV Index:   40 LVOT Area:     3.14 cm  RIGHT VENTRICLE             IVC RV Basal diam:  2.60 cm     IVC diam: 1.30 cm RV S prime:     18.50 cm/s TAPSE (M-mode): 2.8 cm LEFT ATRIUM           Index       RIGHT ATRIUM           Index LA diam:      2.50 cm 1.53 cm/m  RA Area:     12.50 cm LA Vol (A2C): 38.7 ml 23.73 ml/m RA Volume:   25.60 ml  15.70 ml/m LA Vol (A4C): 24.4 ml 14.96 ml/m  AORTIC VALVE AV Area (Vmax):    2.96 cm AV Area (Vmean):   2.80 cm AV Area (VTI):     2.97 cm AV Vmax:           120.00 cm/s AV Vmean:          84.100 cm/s AV VTI:            0.219 m AV Peak Grad:      5.8 mmHg AV Mean Grad:      3.0 mmHg LVOT Vmax:         113.00 cm/s LVOT Vmean:        74.900 cm/s LVOT VTI:          0.207 m LVOT/AV VTI ratio: 0.95  AORTA Ao Root diam: 3.30 cm Ao Asc diam:  3.40 cm MITRAL VALVE MV Area (PHT): 3.68 cm    SHUNTS MV Area VTI:   3.39 cm    Systemic VTI:  0.21 m MV Peak grad:  2.9 mmHg  Systemic Diam: 2.00 cm MV Mean grad:  1.0 mmHg MV Vmax:       0.85 m/s MV Vmean:      54.7 cm/s MV Decel Time: 206 msec MV E velocity: 63.40 cm/s MV A velocity: 76.00 cm/s MV E/A ratio:  0.83 Cherlynn Kaiser MD Electronically signed by Cherlynn Kaiser MD Signature Date/Time: 05/26/2021/7:31:57 PM    Final    CT Renal Stone Study  Result Date: 05/24/2021 CLINICAL DATA:  Flank pain, urinary frequency EXAM: CT ABDOMEN AND PELVIS WITHOUT CONTRAST TECHNIQUE: Multidetector CT imaging of the abdomen and pelvis was performed following the standard protocol without IV contrast. COMPARISON:  None available FINDINGS: Lower chest: No acute abnormality. Hepatobiliary: Limited without IV contrast. Punctate hepatic dome calcified granuloma anteriorly. No large focal hepatic abnormality intrahepatic  biliary dilatation. Gallbladder unremarkable. Common bile duct nondilated. Pancreas: Unremarkable. No pancreatic ductal dilatation or surrounding inflammatory changes. Spleen: Normal in size without focal abnormality. Adrenals/Urinary Tract: Adrenal glands are unremarkable. Kidneys are normal, without renal calculi, focal lesion, or hydronephrosis. Bladder is unremarkable. Stomach/Bowel: Negative for bowel obstruction, significant dilatation, ileus, or free air. Normal appendix demonstrated. No free fluid, fluid collection, hemorrhage, hematoma abscess, ascites. Vascular/Lymphatic: Minor aortic atherosclerosis and tortuosity. Negative for aneurysm. Limited assessment without IV contrast. Mild bilateral external iliac adenopathy, nonspecific. Left external iliac has a short axis diameter 1.3 cm. No inguinal adenopathy. No significant retroperitoneal adenopathy. Reproductive: Remote hysterectomy. Calcifications noted in the pelvis appearing benign or postoperative. No adnexal abnormality. No pelvic free fluid. Other: Small fat containing umbilical hernia. No large ventral hernia. No inguinal hernia. Negative for ascites. Musculoskeletal: Thoracolumbar degenerative changes. Lower lumbar facet arthropathy. No acute osseous finding. IMPRESSION: No acute obstructing urinary tract or ureteral calculus. No obstructive uropathy or hydronephrosis. No other acute intra-abdominal or pelvic finding by noncontrast CT. Nonspecific bilateral mild external iliac adenopathy, short axis diameters measure up to 1.3 cm on the left. Aortic Atherosclerosis (ICD10-I70.0). Electronically Signed   By: Jerilynn Mages.  Shick M.D.   On: 05/24/2021 11:47   VAS [place] LOWER EXTREMITY VENOUS (DVT)  Result Date: 05/25/2021  Lower Venous DVT Study Patient Name:  JNIYA MADARA [facility]  Date of Exam:   05/25/2021 Medical Rec #: [id number]          Accession #:    [id number] Date of Birth: 08-Sep-1951          Patient Gender: F Patient Age:   69Y Exam Location:  [facility] Procedure:      VAS [place] LOWER EXTREMITY VENOUS (DVT) Referring Phys: [id number] BELKYS A REGALADO --------------------------------------------------------------------------------  Indications: Edema.  Comparison Study: no prior Performing Technologist: Archie Patten RVS  Examination Guidelines: A complete evaluation includes B-mode imaging, spectral Doppler, color Doppler, and power Doppler as needed of all accessible portions of each vessel. Bilateral testing is considered an integral part of a complete examination. Limited examinations for reoccurring indications may be performed as noted. The reflux portion of the exam is performed with the patient in reverse Trendelenburg.  +---------+---------------+---------+-----------+----------+--------------+ RIGHT    CompressibilityPhasicitySpontaneityPropertiesThrombus Aging +---------+---------------+---------+-----------+----------+--------------+ CFV      Full           Yes      Yes                                 +---------+---------------+---------+-----------+----------+--------------+ SFJ      Full                                                        +---------+---------------+---------+-----------+----------+--------------+  FV Prox  Full                                                        +---------+---------------+---------+-----------+----------+--------------+ FV Mid   Full                                                        +---------+---------------+---------+-----------+----------+--------------+ FV DistalFull                                                        +---------+---------------+---------+-----------+----------+--------------+ PFV      Full                                                        +---------+---------------+---------+-----------+----------+--------------+ POP      Full           Yes      Yes                                  +---------+---------------+---------+-----------+----------+--------------+ PTV      Full                                                        +---------+---------------+---------+-----------+----------+--------------+ PERO     Full                                                        +---------+---------------+---------+-----------+----------+--------------+   +---------+---------------+---------+-----------+----------+--------------+ LEFT     CompressibilityPhasicitySpontaneityPropertiesThrombus Aging +---------+---------------+---------+-----------+----------+--------------+ CFV      Full           Yes      Yes                                 +---------+---------------+---------+-----------+----------+--------------+ SFJ      Full                                                        +---------+---------------+---------+-----------+----------+--------------+ FV Prox  Full                                                        +---------+---------------+---------+-----------+----------+--------------+  FV Mid   Full                                                        +---------+---------------+---------+-----------+----------+--------------+ FV DistalFull                                                        +---------+---------------+---------+-----------+----------+--------------+ PFV      Full                                                        +---------+---------------+---------+-----------+----------+--------------+ POP      Full           Yes      Yes                                 +---------+---------------+---------+-----------+----------+--------------+ PTV      Full                                                        +---------+---------------+---------+-----------+----------+--------------+ PERO     Full                                                         +---------+---------------+---------+-----------+----------+--------------+     Summary: BILATERAL: - No evidence of deep vein thrombosis seen in the lower extremities, bilaterally. -No evidence of popliteal cyst, bilaterally.   *See table(s) above for measurements and observations. Electronically signed by Deitra Mayo MD on 05/25/2021 at 2:52:23 PM.    Final     Labs:  CBC: Recent Labs    05/26/21 0236 05/27/21 0106 05/28/21 0211 05/30/21 1334  WBC 6.1 7.2 6.6 6.1  HGB 11.1* 10.7* 10.3* 10.4*  HCT 34.3* 32.2* 30.6* 31.6*  PLT 221 249 231 284    COAGS: Recent Labs    05/24/21 1150  INR 1.1  APTT 32    BMP: Recent Labs    08/17/20 0203 08/17/20 2241 08/18/20 0021 08/18/20 0454 08/19/20 0745 12/04/20 1549 05/27/21 0106 05/28/21 0211 05/30/21 1334 06/01/21 0203  NA 138 139   < > 140 137   < > 130* 134* 135 137  K 3.1* 3.6   < > 3.2* 3.6   < > 3.4* 3.3* 3.9 3.5  CL 104 106  --  110 107   < > 102 107 109 109  CO2 18* 19*  --  20* 19*   < > 18* 20* 21* 20*  GLUCOSE 111* 93  --  88 98   < > 97 107* 104* 146*  BUN 37* 31*  --  27* 8   < >  12 9 11 18   CALCIUM 10.1 9.4  --  9.1 9.2   < > 8.4* 8.2* 8.7* 8.9  CREATININE 1.31* 0.93  --  0.89 0.72   < > 0.74 0.64 0.66 0.58  GFRNONAA 41* >60  --  >60 >60   < > >60 >60 >60 >60  GFRAA 48* >60  --  >60 >60  --   --   --   --   --    < > = values in this interval not displayed.    LIVER FUNCTION TESTS: Recent Labs    12/04/20 1549 04/07/21 1450 05/24/21 1005 05/26/21 0236  BILITOT 0.3 0.3 0.5 0.6  AST 32 19 24 25   ALT 36* 16 16 13   ALKPHOS 94 104 75 54  PROT 7.9 7.6 7.7 6.4*  ALBUMIN 4.4 4.1 3.4* 2.9*    TUMOR MARKERS: No results for input(s): AFPTM, CEA, CA199, CHROMGRNA in the last 8760 hours.  Assessment and Plan: 70 y.o. female with past medical history of anemia, anxiety, chronic migraines, prior ovarian vein clot, esophagitis/GERD, hypertension, prediabetes, ?chronic kidney disease, IBS, prior MRSA in  2013, prior shingles, peptic ulcer disease, peripheral vascular disease, vitamin D deficiency.  She was admitted to [facility] on 6/27 with generalized weakness fever, chills, loss of balance as well as left-sided weakness.  Imaging has revealed no PE, 4 mm right middle lobe peripheral nodular opacity, mild largely vascular/perivascular appearing enhancement and areas of signal abnormality in the deep gray nuclei as well as a small amount of leptomeningeal enhancement over the right and possible left parietal convexities.  Also present was restricted diffusion in the right basal ganglia corresponding to lenticulostriate territory most consistent with acute infarction.  CT angio head and neck revealed minimal atherosclerosis in the head neck without large vessel occlusion or significant stenosis.  She was COVID-19 negative.  Blood cultures, CSF cx negative to date.  Due to concern for possible vasculitis request now received from neurology for diagnostic cerebral arteriogram.  She is currently on Lovenox, Plavix, aspirin and Solu-Medrol.  Current labs include potassium 3.5, creatinine 0.58, WBC normal, hemoglobin 10.4, platelets 284k.Risks and benefits of procedure were discussed with the patient/spouse including, but not limited to bleeding, infection, vascular injury or contrast induced renal failure.  This interventional procedure involves the use of X-rays and because of the nature of the planned procedure, it is possible that we will have prolonged use of X-ray fluoroscopy.  Potential radiation risks to you include (but are not limited to) the following: - A slightly elevated risk for cancer  several years later in life. This risk is typically less than 0.5% percent. This risk is low in comparison to the normal incidence of human cancer, which is 33% for women and 50% for men according to the [place]. - Radiation induced injury can include skin redness, resembling a rash, tissue  breakdown / ulcers and hair loss (which can be temporary or permanent).   The likelihood of either of these occurring depends on the difficulty of the procedure and whether you are sensitive to radiation due to previous procedures, disease, or genetic conditions.   IF your procedure requires a prolonged use of radiation, you will be notified and given written instructions for further action.  It is your responsibility to monitor the irradiated area for the 2 weeks following the procedure and to notify your physician if you are concerned that you have suffered a radiation induced injury.    All  of the patient's questions were answered, patient is agreeable to proceed.  Consent signed and in chart. Procedure scheduled for today.       Thank you for this interesting consult.  I greatly enjoyed meeting LONDA MACKOWSKI and look forward to participating in their care.  A copy of this report was sent to the requesting provider on this date.  Electronically Signed: D. Rowe Robert, PA-C 06/01/2021, 9:32 AM   I spent a total of   30 minutes  in face to face in clinical consultation, greater than 50% of which was counseling/coordinating care for cerebral arteriogram

## 2021-06-01 NOTE — Progress Notes (Signed)
PROGRESS NOTE    Michaela Rios  AQT:[id number] DOB: 1951-09-28 DOA: 05/24/2021 PCP: Binnie Rail, MD   Brief Narrative: 70 year old with past medical history significant for hypertension, prediabetes, depression, CKD stage II, history of PUD, chronic low back pain, migraine who presented to the ED with generalized weakness and loss of balance.  Patient fell on Sunday while trying to go to the bathroom and lost her balance.  She has been having shivering.  She denies cough, dysuria, abdominal pain.  She developed diarrhea overnight.  Patient was found to have high fever 101.4 on admission.  Elevated D-dimer.  CTA negative for PE.  Dopplers lower extremity negative for DVT. Admitted with SIRS criteria unclear etiology.   Assessment & Plan:   Principal Problem:   Sepsis ([place]) Active Problems:   Anxiety   Depression   Essential hypertension   Prediabetes   Hypokalemia   Tachycardia  1-SIRS; FUO -Presents with fever (6/27 tempeture 101) , tachycardia, tachypnea,  mild elevation of lactic acid. Confusion.  -Chest x ray negative for PNA. UA; negative for UTI. COVID PCR negative -Urine culture no growth to date. and blood cultures: no growth to date.  -CT abdomen and pelvis: Negative for acute infectious process, hepatic asteatosis -No Nuchal rigidity. Complaints of chronic migraines.  -Vancomycin and flagyl discontinue 6/29. Completed 5 days of Aztreonam.  -Vasculitis work up: ANA positive, Double strand DNA negative,  RF negative. ANCA; pending. Cryoglobulin pending., hepatitis panel negative, complement normal,  Sjogren's pending. , ESR 37, CRP; 6.8. RPR negative. -ID consulted due to persistent fever. ID recommend hold on IV antibiotics.  They recommend follow up vasculitis work up.  -Patient does not have any systemic Vasculitis symptoms; No pulmonary affection, no joint pain , no abdominal pain.  -Repeated Blood culture 7/03.; No growth to date.  Plan for TEE to rule out  marantic endocarditis. TEE on 7/07/ Afebrile.   2-Acute infarct Right basal Ganglia:  Acute metabolic encephalopathy;  -Husband report patient had confusion episode 2/29. -CT: New multifocal hypoattenuation in the basilar ganglia and thalami bilaterally of indeterminate etiology.  Consideration include multifocal infarcts, toxic metabolic insults, hypoxic ischemic injury and infection.  No mass-effect to suggest tumor. Correlate  with MRI.  -MRI: Right basal ganglia infarct, hyperintensity and mild enlargement of the left head of caudate, left thalamus and a small amount of hyperintensity in the right medial thalamus.  Vasogenic type edema in the deep white matter structures on the left.  Recommend postcontrast MRI to further evaluate. -MRI with Contrast: Mild, largely vascular/perivascular appearing enhancement in the areas of signal abnormality in the deep gray nuclei as well as a small amount of leptomeningeal enhancement over the right and possibly left parietal convexities.  No solid mass enhancing.  Differential Diagnosis: Cryptococcus, vasculitis, or unusual neoplasm and so as angiocentric lymphoma and granulomatosis/autoimmune or other inflammatory condition.  -LDL ;45. Discussed with Neurology no need for statins, LDL below goal.  -Continue with aspirin -On aspirin and plavix.  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 Fever :  -LP elevated protein, no WBC, VDRL CSF pending, HSV SCF pending. Culture: no growth to date. Cytology pending, Oligoclonal bands pending, paraneoplastic encephalitis panel pending.  -EEG; negative for seizure.  -Labs; autoimmune work up: RF negative, , Sjogren's pending , ANCA, HIV non reactive, CRP 6.8 , ESR 37 , ANA positive, Double strand DNA negative. , B 1: 115. B12; 254. VDRL SCF Pending, Oligoclonal CSF pending.  Cryoglobulin,  hepatitis panel negative,  complement ordered.  -She will need repeat MRI in one  month.  -On B 12 supplement.  -Patient had fever this am, she is feeling more sleepy today, report mild headaches. I discussed with Dr Rory Percy, neurologist , he will evaluate patient today and will provide further recommendations.  -Started on IV steroids by neurologist,plan for 3 days. ACE level Pending. Last dose steroids 7/05. -Differential: CNS vasculitis, Angiocentric Lymphoma (flow cytology pending and cytology pending)  Plan for arteriogram (evaluate for vasculitis) and TEE>   Hypokalemia; replaced.   HTN;  Continue with Norvasc, hydralazine and metoprolol.//  Tachycardia; -received IV fluids.   Generalized weakness.  IV fluids.  PT , OT  B 12. Low normal, on supplement.   Depression;  Continue with Wellbutrin, Effexor.   CKD stage II Stable.   Migraines; Continue with topiromate.    Pulmonary Nodule. Needs to follow up with PCP.  Positive D dimer; Doppler LE negative for DVT> CTA negative for PE>   Nutrition Problem: Inadequate oral intake Etiology: decreased appetite    Signs/Symptoms: per patient/family report, meal completion < 25%    Interventions: Ensure Enlive (each supplement provides 350kcal and 20 grams of protein), Magic cup, MVI  Estimated body mass index is 23.74 kg/m as calculated from the following:   Height as of this encounter: $RemoveBeforeD'5\' 3"'[id number]$  (1.6 m).   Weight as of this encounter: 60.8 kg.   DVT prophylaxis: Lovenox Code Status: Full code Family Communication: Husband who was at bedside 7/04  Disposition Plan:  Status is: Inpatient  Remains inpatient appropriate because:IV treatments appropriate due to intensity of illness or inability to take PO  Dispo: The patient is from: Home              Anticipated d/c is to:  CIR              Patient currently is medically stable to d/c.   Difficult to place patient No        Consultants:  None  Procedures:  none  Antimicrobials:  Vancomycin Aztreonam.   Subjective:  Patient is feeling  well, no new complaints.      Objective: Vitals:   05/31/21 2047 05/31/21 2155 06/01/21 0500 06/01/21 0620  BP: (!) 163/98 (!) 150/98 (!) 156/95 (!) 156/95  Pulse: 93  81   Resp: 16  16   Temp: 97.6 F (36.4 C)  97.8 F (36.6 C)   TempSrc: Oral  Oral   SpO2: 100%  97%   Weight:      Height:        Intake/Output Summary (Last 24 hours) at 06/01/2021 8206 Last data filed at 06/01/2021 0500 Gross per 24 hour  Intake --  Output 1200 ml  Net -1200 ml    Filed Weights   05/24/21 0956  Weight: 60.8 kg    Examination:  General exam: NAD Respiratory system: CTA Cardiovascular system: S 1, S 2 RRR Gastrointestinal system: BS present, soft, nt Central nervous system: Alert, follwos command Extremities: No edema    Data Reviewed: I have personally reviewed following labs and imaging studies  CBC: Recent Labs  Lab 05/26/21 0236 05/27/21 0106 05/28/21 0211 05/30/21 1334  WBC 6.1 7.2 6.6 6.1  HGB 11.1* 10.7* 10.3* 10.4*  HCT 34.3* 32.2* 30.6* 31.6*  MCV 91.0 88.7 89.0 89.5  PLT 221 249 231 015    Basic Metabolic Panel: Recent Labs  Lab 05/26/21 0236 05/27/21 0106 05/28/21 0211 05/30/21 1334 06/01/21 6153  NA 132* 130* 134* 135 137  K 3.6 3.4* 3.3* 3.9 3.5  CL 103 102 107 109 109  CO2 18* 18* 20* 21* 20*  GLUCOSE 94 97 107* 104* 146*  BUN $Re'8 12 9 11 18  'CJt$ CREATININE 0.70 0.74 0.64 0.66 0.58  CALCIUM 8.9 8.4* 8.2* 8.7* 8.9    GFR: Estimated Creatinine Clearance: 54.1 mL/min (by C-G formula based on SCr of 0.58 mg/dL). Liver Function Tests: Recent Labs  Lab 05/26/21 0236  AST 25  ALT 13  ALKPHOS 54  BILITOT 0.6  PROT 6.4*  ALBUMIN 2.9*    No results for input(s): LIPASE, AMYLASE in the last 168 hours. No results for input(s): AMMONIA in the last 168 hours. Coagulation Profile: No results for input(s): INR, PROTIME in the last 168 hours.  Cardiac Enzymes: No results for input(s): CKTOTAL, CKMB, CKMBINDEX, TROPONINI in the last 168 hours. BNP  (last 3 results) No results for input(s): PROBNP in the last 8760 hours. HbA1C: No results for input(s): HGBA1C in the last 72 hours.  CBG: Recent Labs  Lab 05/31/21 2213  GLUCAP 137*   Lipid Profile: No results for input(s): CHOL, HDL, LDLCALC, TRIG, CHOLHDL, LDLDIRECT in the last 72 hours.  Thyroid Function Tests: No results for input(s): TSH, T4TOTAL, FREET4, T3FREE, THYROIDAB in the last 72 hours.  Anemia Panel: No results for input(s): VITAMINB12, FOLATE, FERRITIN, TIBC, IRON, RETICCTPCT in the last 72 hours.  Sepsis Labs: No results for input(s): PROCALCITON, LATICACIDVEN in the last 168 hours.   Recent Results (from the past 240 hour(s))  Blood culture (routine x 2)     Status: None   Collection Time: 05/24/21 10:51 AM   Specimen: BLOOD  Result Value Ref Range Status   Specimen Description BLOOD RIGHT ANTECUBITAL  Final   Special Requests   Final    BOTTLES DRAWN AEROBIC AND ANAEROBIC Blood Culture adequate volume   Culture   Final    NO GROWTH 5 DAYS Performed at [facility], [address] [place], [place] [address]    Report Status 05/29/2021 FINAL  Final  Resp Panel by RT-PCR (Flu A&B, Covid) Nasopharyngeal Swab     Status: None   Collection Time: 05/24/21 11:14 AM   Specimen: Nasopharyngeal Swab; Nasopharyngeal(NP) swabs in vial transport medium  Result Value Ref Range Status   SARS Coronavirus 2 by RT PCR NEGATIVE NEGATIVE Final    Comment: (NOTE) SARS-CoV-2 target nucleic acids are NOT DETECTED.  The SARS-CoV-2 RNA is generally detectable in upper respiratory specimens during the acute phase of infection. The lowest concentration of SARS-CoV-2 viral copies this assay can detect is 138 copies/mL. A negative result does not preclude SARS-Cov-2 infection and should not be used as the sole basis for treatment or other patient management decisions. A negative result may occur with  improper specimen collection/handling, submission of specimen  other than nasopharyngeal swab, presence of viral mutation(s) within the areas targeted by this assay, and inadequate number of viral copies(<138 copies/mL). A negative result must be combined with clinical observations, patient history, and epidemiological information. The expected result is Negative.  Fact Sheet for Patients:  EntrepreneurPulse.com.au  Fact Sheet for Healthcare Providers:  IncredibleEmployment.be  This test is no t yet approved or cleared by the [place] FDA and  has been authorized for detection and/or diagnosis of SARS-CoV-2 by FDA under an Emergency Use Authorization (EUA). This EUA will remain  in effect (meaning this test can be used) for the duration of the  COVID-19 declaration under Section 564(b)(1) of the Act, 21 U.S.C.section 360bbb-3(b)(1), unless the authorization is terminated  or revoked sooner.       Influenza A by PCR NEGATIVE NEGATIVE Final   Influenza B by PCR NEGATIVE NEGATIVE Final    Comment: (NOTE) The Xpert Xpress SARS-CoV-2/FLU/RSV plus assay is intended as an aid in the diagnosis of influenza from Nasopharyngeal swab specimens and should not be used as a sole basis for treatment. Nasal washings and aspirates are unacceptable for Xpert Xpress SARS-CoV-2/FLU/RSV testing.  Fact Sheet for Patients: EntrepreneurPulse.com.au  Fact Sheet for Healthcare Providers: IncredibleEmployment.be  This test is not yet approved or cleared by the [place] FDA and has been authorized for detection and/or diagnosis of SARS-CoV-2 by FDA under an Emergency Use Authorization (EUA). This EUA will remain in effect (meaning this test can be used) for the duration of the COVID-19 declaration under Section 564(b)(1) of the Act, 21 U.S.C. section 360bbb-3(b)(1), unless the authorization is terminated or revoked.  Performed at [facility], [place] [address] [place],  [place] [address]   Blood culture (routine x 2)     Status: None   Collection Time: 05/24/21 12:03 PM   Specimen: BLOOD  Result Value Ref Range Status   Specimen Description BLOOD LEFT ANTECUBITAL  Final   Special Requests   Final    BOTTLES DRAWN AEROBIC AND ANAEROBIC Blood Culture adequate volume   Culture   Final    NO GROWTH 5 DAYS Performed at [facility], [place] [address], [place], [place] [address]    Report Status 05/29/2021 FINAL  Final  Urine culture     Status: None   Collection Time: 05/24/21 12:48 PM   Specimen: Urine, Clean Catch  Result Value Ref Range Status   Specimen Description URINE, CLEAN CATCH  Final   Special Requests NONE  Final   Culture   Final    NO GROWTH Performed at [facility], [place] [address] [place], [place] [address]    Report Status 05/25/2021 FINAL  Final  CSF culture w Gram Stain     Status: None   Collection Time: 05/27/21  7:14 PM   Specimen: CSF; Cerebrospinal Fluid  Result Value Ref Range Status   Specimen Description CSF  Final   Special Requests NONE  Final   Gram Stain   Final    WBC PRESENT, PREDOMINANTLY MONONUCLEAR NO ORGANISMS SEEN CYTOSPIN SMEAR    Culture   Final    NO GROWTH 3 DAYS Performed at [facility], [place] [address] [place], [place] [address]    Report Status 05/31/2021 FINAL  Final  Culture, blood (routine x 2)     Status: None (Preliminary result)   Collection Time: 05/30/21  4:11 PM   Specimen: BLOOD RIGHT HAND  Result Value Ref Range Status   Specimen Description BLOOD RIGHT HAND  Final   Special Requests   Final    BOTTLES DRAWN AEROBIC AND ANAEROBIC Blood Culture results may not be optimal due to an inadequate volume of blood received in culture bottles   Culture   Final    NO GROWTH < 24 HOURS Performed at [facility], [place] [address] [place], [place] [address]    Report Status PENDING  Incomplete  Culture, blood (routine x 2)     Status: None (Preliminary result)   Collection  Time: 05/30/21  4:11 PM   Specimen: BLOOD RIGHT HAND  Result Value Ref  Range Status   Specimen Description BLOOD RIGHT HAND  Final   Special Requests   Final    BOTTLES DRAWN AEROBIC ONLY Blood Culture results may not be optimal due to an inadequate volume of blood received in culture bottles   Culture   Final    NO GROWTH < 24 HOURS Performed at [place] [address], [place], [place] [address]    Report Status PENDING  Incomplete          Radiology Studies: No results found.      Scheduled Meds:  amLODipine  5 mg Oral q AM   aspirin EC  81 mg Oral Daily   buPROPion  300 mg Oral Daily   calcium carbonate  1 tablet Oral Q breakfast   cholecalciferol  1,000 Units Oral Daily   clopidogrel  75 mg Oral Daily   enoxaparin (LOVENOX) injection  40 mg Subcutaneous Q24H   feeding supplement  237 mL Oral BID BM   hydrALAZINE  25 mg Oral BID   metoprolol succinate  100 mg Oral Daily   multivitamin with minerals  1 tablet Oral Daily   pantoprazole  40 mg Oral Daily   topiramate  150 mg Oral QHS   venlafaxine XR  75 mg Oral Q breakfast   vitamin B-12  1,000 mcg Oral Daily   Continuous Infusions:  methylPREDNISolone (SOLU-MEDROL) injection 1,000 mg (05/31/21 1025)     LOS: 8 days    Time spent: 35 minutes.     Elmarie Shiley, MD [facility]   If 7PM-7AM, please contact night-coverage [email or web address]  06/01/2021, 7:22 AM

## 2021-06-01 NOTE — Progress Notes (Signed)
Left upper extremity arterial duplex has been completed. Preliminary results can be found in CV Proc through chart review.  Results were given to Dr. Oneida Alar.  06/01/21 7:07 PM Carlos Levering RVT

## 2021-06-01 NOTE — Procedures (Signed)
INTERVENTIONAL NEURORADIOLOGY BRIEF POSTPROCEDURE NOTE  Diagnostic cerebral angiogram.   Attending: Dr. Pedro Earls  Assistant: None.   Diagnosis: Question of vasculitis.   Access site: Distal right radial artery.   Access closure: Inflatable band.   Anesthesia: Moderate sedation.   Medication used: 1 mg Versed IV; 50 mcg Fentanyl IV.  Complications: None.   Estimated blood loss: Negligible.   Specimen: None.   Findings: Unremarkable cerebral angiogram.  No evidence of aneurysm, occlusion, high-grade stenosis, vasospasm or other significant vascular abnormality.   The patient tolerated the procedure well without incident or complication and is in stable condition.

## 2021-06-01 NOTE — Progress Notes (Signed)
SLP Cancellation Note  Patient Details Name: Michaela Rios MRN: [id number] DOB: 04-15-1951   Cancelled treatment:       Reason Eval/Treat Not Completed: Other (comment) Attempted to see pt this morning but she was NPO pending possible procedure. Pt now with diet order back in place but SLP not able to return this date. Will continue to follow as able.     Osie Bond., M.A. [facility] Pager [phone] Office [phone]  06/01/2021, 4:34 PM

## 2021-06-01 NOTE — Progress Notes (Signed)
Inpatient Rehab Admissions Coordinator:   Awaiting determination from insurance regarding CIR prior auth request.  Will continue to follow.   Shann Medal, PT, DPT Admissions Coordinator [phone] 06/01/21  12:25 PM

## 2021-06-01 NOTE — Progress Notes (Signed)
Interventional Neuroradiology  Called to patient's room due to hand swelling. Nurse informed she removed TR band with no signs of external bleeding. Upon my evaluation, hand severely expanded at the dorsum, warm, no digital edema, normal capillary refill. Patient refers local pain, but no paresthesia or other symptoms. Ultrasound revealed a large dorsal hematoma. Hand circumference of approximately 21.5 cm.  Manual pressure was held at the right radial artery at the wrist and snuffbox for approximately 20 minutes. TR band replied with 16 mL of air. Hand size stable. Nurse requested to communicate any further expansion or change in signs/symptoms. Consult to vascular surgery (Dr. Oneida Alar) placed for potential compartment syndrome.    Dr. Pedro Earls, MD [phone]

## 2021-06-01 NOTE — Progress Notes (Signed)
[facility] for Infectious Disease  Date of Admission:  05/24/2021           Reason for visit: Follow up on FUO   ASSESSMENT:    FUO - afebrile now over the past 24 hrs and currently on day #3 of steroids.  ID work up negative thus far and suspicious for vasculitis.  TEE pending to evaluate for marantic endocarditis and IR consulted for cerebral arteriogram.  PLAN:    Continue to monitor off antibiotics Rest of care per [facility] and neurology Will sign off for now, please call if new ID concerns arise   Principal Problem:   Sepsis ([place]) Active Problems:   Anxiety   Depression   Essential hypertension   Prediabetes   Hypokalemia   Tachycardia   Cerebral thrombosis with cerebral infarction    MEDICATIONS:    Scheduled Meds:  amLODipine  5 mg Oral q AM   aspirin EC  81 mg Oral Daily   buPROPion  300 mg Oral Daily   calcium carbonate  1 tablet Oral Q breakfast   cholecalciferol  1,000 Units Oral Daily   clopidogrel  75 mg Oral Daily   enoxaparin (LOVENOX) injection  40 mg Subcutaneous Q24H   feeding supplement  237 mL Oral BID BM   heparin sodium (porcine)       hydrALAZINE  25 mg Oral BID   lidocaine       metoprolol succinate  100 mg Oral Daily   multivitamin with minerals  1 tablet Oral Daily   nitroGLYCERIN       pantoprazole  40 mg Oral Daily   topiramate  150 mg Oral QHS   venlafaxine XR  75 mg Oral Q breakfast   verapamil       vitamin B-12  1,000 mcg Oral Daily   Continuous Infusions: PRN Meds:.acetaminophen **OR** acetaminophen, HYDROcodone-acetaminophen, LORazepam, ondansetron, SUMAtriptan, tiZANidine, zolpidem  SUBJECTIVE:     No new complaints this morning, husband at bedside, no fevers, chills.  Tolerating steroids thus far.   Review of Systems  All other systems reviewed and are negative.    OBJECTIVE:   Blood pressure (!) 156/95, pulse 81, temperature 97.8 F (36.6 C), temperature source Oral, resp. rate 16, height 5\' 3"  (1.6 m),  weight 60.8 kg, SpO2 97 %. Body mass index is 23.74 kg/m.  Physical Exam Constitutional:      General: She is not in acute distress.    Appearance: Normal appearance.  HENT:     Head: Normocephalic and atraumatic.  Abdominal:     General: There is no distension.     Palpations: Abdomen is soft.  Skin:    General: Skin is warm and dry.     Findings: No rash.  Neurological:     General: No focal deficit present.     Mental Status: She is alert and oriented to person, place, and time.  Psychiatric:        Mood and Affect: Mood normal.        Behavior: Behavior normal.     Lab Results: Lab Results  Component Value Date   WBC 6.1 05/30/2021   HGB 10.4 (L) 05/30/2021   HCT 31.6 (L) 05/30/2021   MCV 89.5 05/30/2021   PLT 284 05/30/2021    Lab Results  Component Value Date   NA 137 06/01/2021   K 3.5 06/01/2021   CO2 20 (L) 06/01/2021   GLUCOSE 146 (H) 06/01/2021   BUN 18  06/01/2021   CREATININE 0.58 06/01/2021   CALCIUM 8.9 06/01/2021   GFRNONAA >60 06/01/2021   GFRAA >60 08/19/2020    Lab Results  Component Value Date   ALT 13 05/26/2021   AST 25 05/26/2021   ALKPHOS 54 05/26/2021   BILITOT 0.6 05/26/2021       Component Value Date/Time   CRP 6.8 (H) 05/27/2021 1124       Component Value Date/Time   ESRSEDRATE 37 (H) 05/27/2021 1124     I have reviewed the micro and lab results in Epic.  Imaging: No results found.   Imaging independently reviewed in Epic.    Raynelle [facility]ious [facility] [phone] pager 06/01/2021, 12:10 PM  I spent greater than 35 minutes with the patient including greater than 50% of time in face to face counsel of the patient and in coordination of their care.

## 2021-06-01 NOTE — Progress Notes (Signed)
   Weekend coverage requested TEE for further evaluation of sepsis to r/o endocarditis.   Earliest time slot is 06/03/21 at 7:30am with Dr. Gardiner Rhyme. Will place order for NPO after MN in anticipation of procedure. Secure chat sent to Dr. Tyrell Antonio to updated to plan.  Abigail Butts, PA-C 06/01/21; 10:13 AM

## 2021-06-02 ENCOUNTER — Inpatient Hospital Stay (HOSPITAL_COMMUNITY): Payer: Medicare HMO

## 2021-06-02 DIAGNOSIS — N1831 Chronic kidney disease, stage 3a: Secondary | ICD-10-CM

## 2021-06-02 DIAGNOSIS — F3289 Other specified depressive episodes: Secondary | ICD-10-CM

## 2021-06-02 DIAGNOSIS — I10 Essential (primary) hypertension: Secondary | ICD-10-CM

## 2021-06-02 DIAGNOSIS — I97630 Postprocedural hematoma of a circulatory system organ or structure following a cardiac catheterization: Secondary | ICD-10-CM

## 2021-06-02 DIAGNOSIS — F419 Anxiety disorder, unspecified: Secondary | ICD-10-CM

## 2021-06-02 DIAGNOSIS — I739 Peripheral vascular disease, unspecified: Secondary | ICD-10-CM

## 2021-06-02 LAB — CBC
HCT: 34.8 % — ABNORMAL LOW (ref 36.0–46.0)
Hemoglobin: 11.5 g/dL — ABNORMAL LOW (ref 12.0–15.0)
MCH: 29.6 pg (ref 26.0–34.0)
MCHC: 33 g/dL (ref 30.0–36.0)
MCV: 89.5 fL (ref 80.0–100.0)
Platelets: 348 10*3/uL (ref 150–400)
RBC: 3.89 MIL/uL (ref 3.87–5.11)
RDW: 15.7 % — ABNORMAL HIGH (ref 11.5–15.5)
WBC: 10 10*3/uL (ref 4.0–10.5)
nRBC: 0.2 % (ref 0.0–0.2)

## 2021-06-02 LAB — BASIC METABOLIC PANEL
Anion gap: 9 (ref 5–15)
BUN: 15 mg/dL (ref 8–23)
CO2: 20 mmol/L — ABNORMAL LOW (ref 22–32)
Calcium: 9.1 mg/dL (ref 8.9–10.3)
Chloride: 111 mmol/L (ref 98–111)
Creatinine, Ser: 0.66 mg/dL (ref 0.44–1.00)
GFR, Estimated: >60 mL/min (ref >60)
Glucose, Bld: 123 mg/dL — ABNORMAL HIGH (ref 70–99)
Potassium: 3.7 mmol/L (ref 3.5–5.1)
Sodium: 140 mmol/L (ref 135–145)

## 2021-06-02 LAB — CYTOLOGY - NON PAP

## 2021-06-02 LAB — GLUCOSE, CAPILLARY: Glucose-Capillary: 169 mg/dL — ABNORMAL HIGH (ref 70–99)

## 2021-06-02 LAB — ANCA TITERS
Atypical P-ANCA titer: <1:20 {titer}
C-ANCA: <1:20 {titer}
P-ANCA: <1:20 {titer}

## 2021-06-02 LAB — HSV 1/2 PCR, CSF
HSV-1 DNA: NEGATIVE
HSV-2 DNA: NEGATIVE

## 2021-06-02 IMAGING — MR MR HEAD WO/W CM
6 of 12 series · 25 of 48 positions shown · IV contrast (gadavist)
Comparison: [DATE]

CLINICAL DATA: Stroke follow-up. Negative diagnostic cerebral
angiogram yesterday.

EXAM:
MRI HEAD WITHOUT AND WITH CONTRAST
TECHNIQUE: Multiplanar, multiecho pulse sequences of the brain and surrounding
structures were obtained without and with intravenous contrast.
CONTRAST:  6mL GADAVIST GADOBUTROL 1 MMOL/ML IV SOLN

[Series 2: DWI · axial · 3.0mm · 0.94mm/px · z∈[-81,+58]mm · 7 of 96 slices shown (1 of 2)]
[im 1/96]
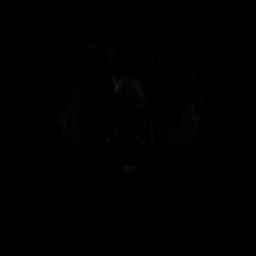
[im 16/96]
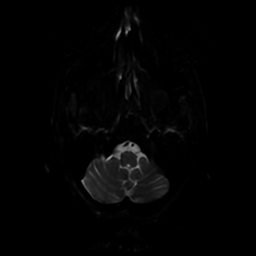
[im 32/96]
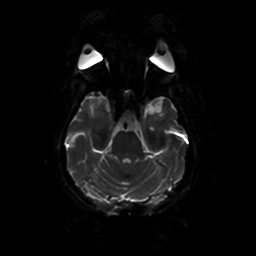
[im 48/96]
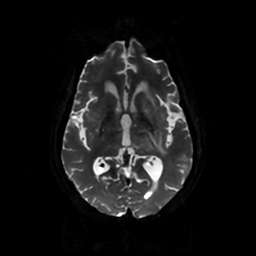
[im 64/96]
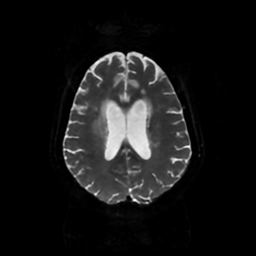
[im 80/96]
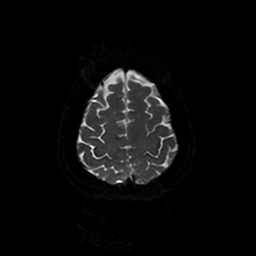
[im 96/96]
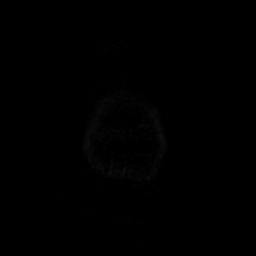

[Series 3: DWI · coronal · 4.0mm · 0.94mm/px · 6 of 69 slices shown (2 of 2)]
[im 1/69]
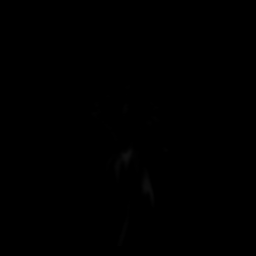
[im 14/69]
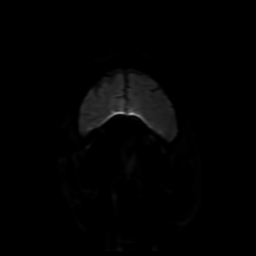
[im 28/69]
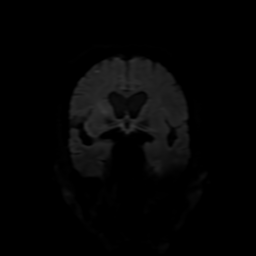
[im 41/69]
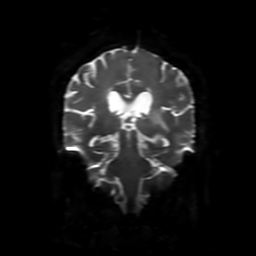
[im 55/69]
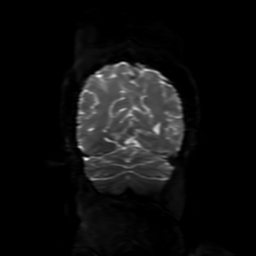
[im 69/69]
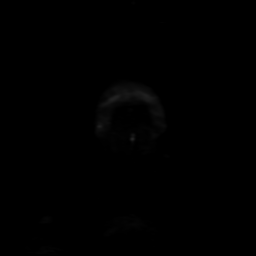

[Series 4: FLAIR · sagittal · 5.0mm · 0.23mm/px · 2 of 23 slices shown (1 of 2)]
[im 1/23]
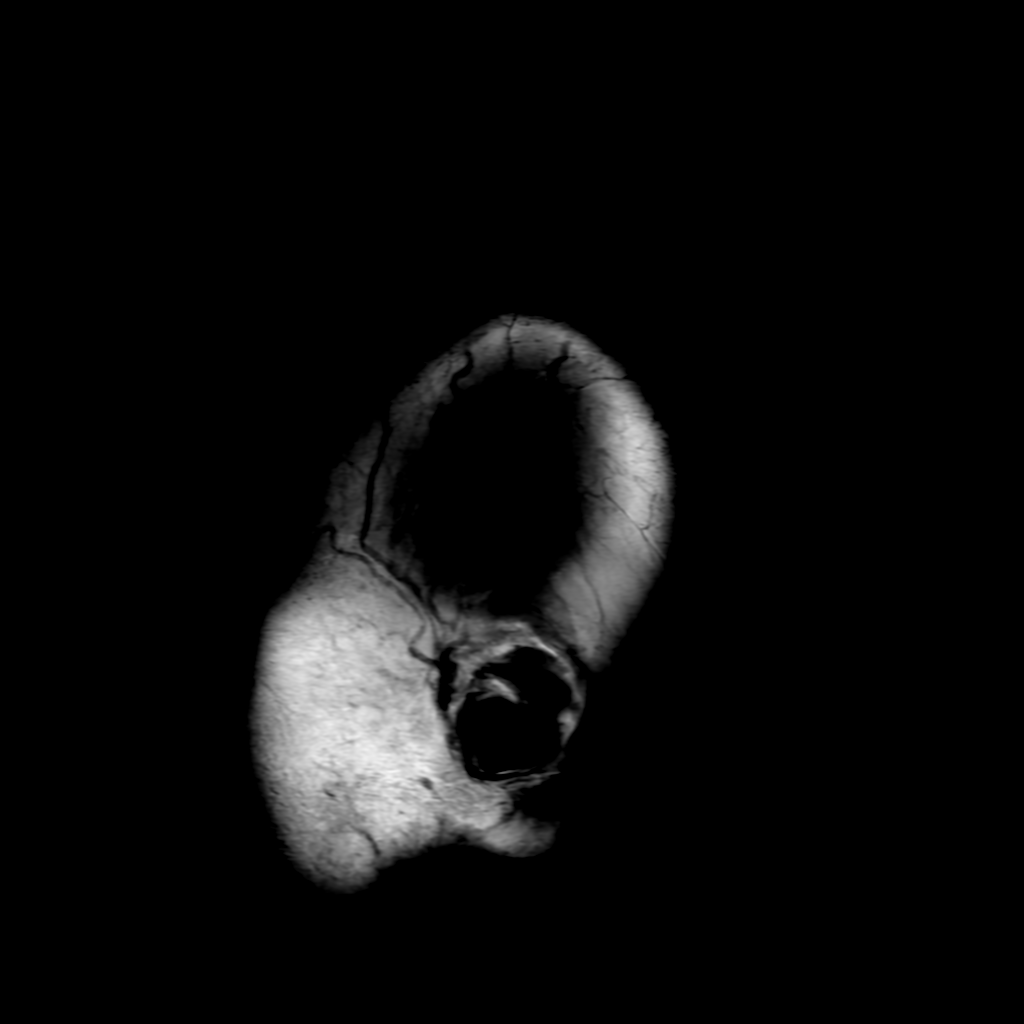
[im 23/23]
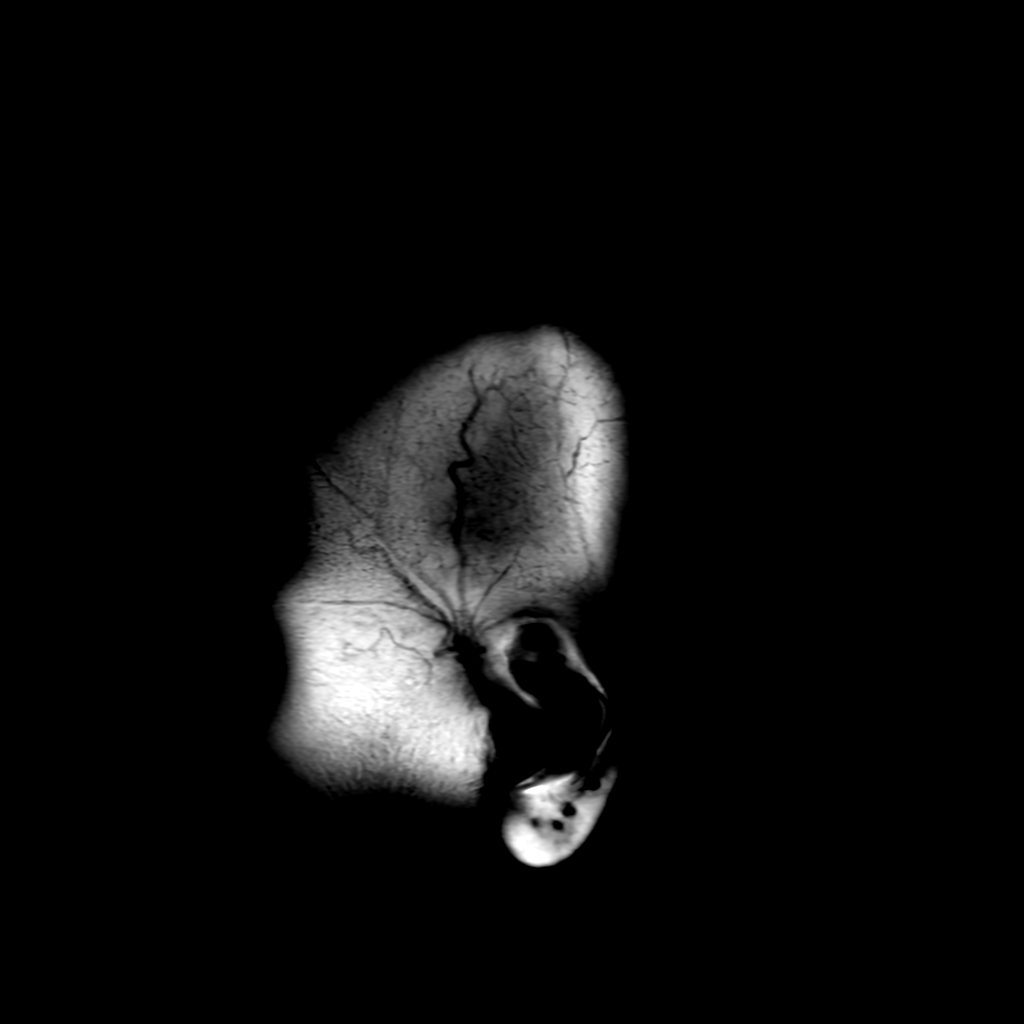

[Series 6: FLAIR · axial · 4.0mm · 0.45mm/px · z∈[-79,+59]mm · 3 of 33 slices shown (2 of 2)]
[im 1/33]
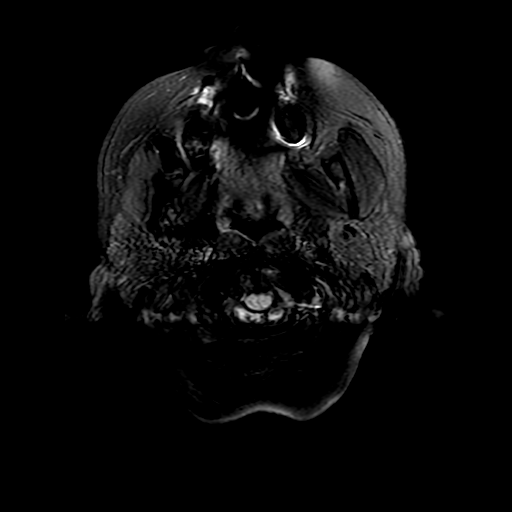
[im 17/33]
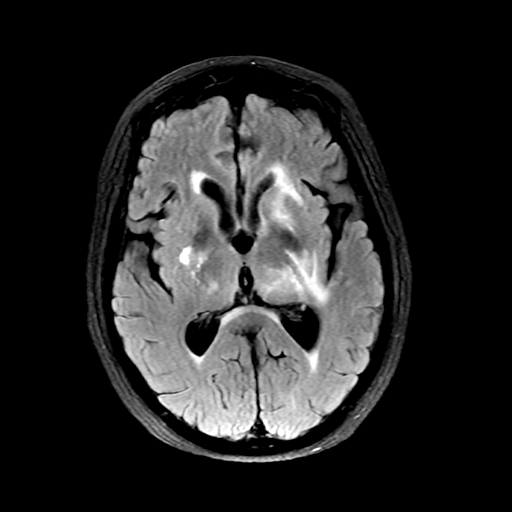
[im 33/33]
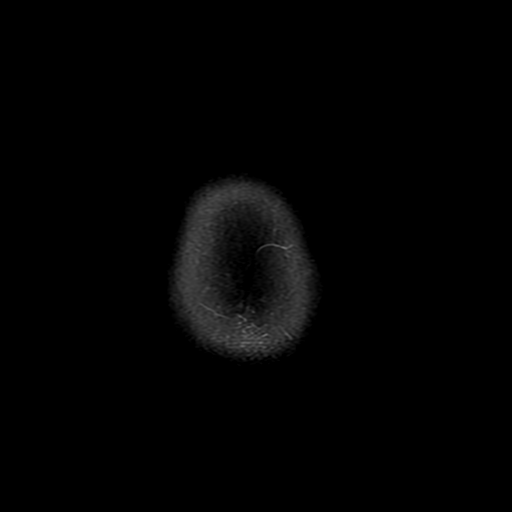

[Series 250: ADC · axial · 3.0mm · 0.94mm/px · z∈[-81,+58]mm · 4 of 48 slices shown (1 of 2)]
[im 1/48]
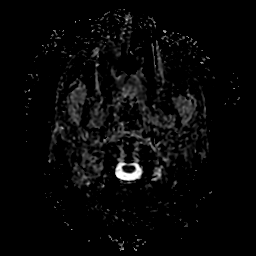
[im 16/48]
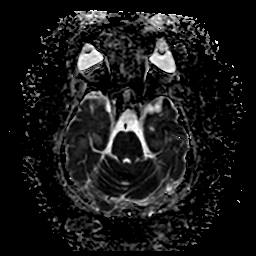
[im 32/48]
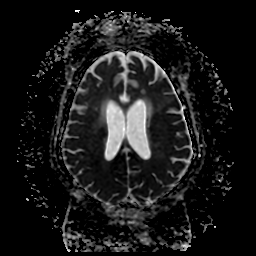
[im 48/48]
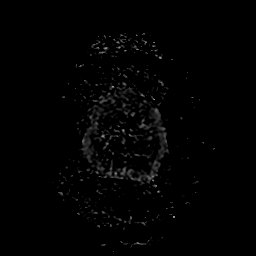

[Series 350: ADC · coronal · 4.0mm · 0.94mm/px · 3 of 35 slices shown (2 of 2)]
[im 1/35]
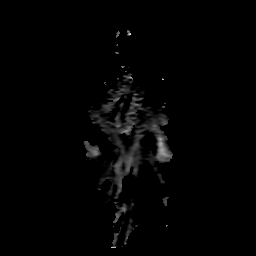
[im 18/35]
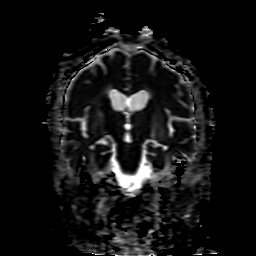
[im 35/35]
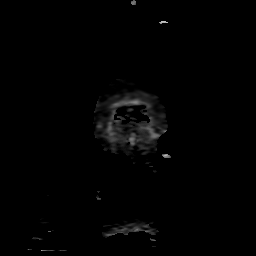

[25 of 48 positions shown; findings below may reference images not displayed]

FINDINGS: Brain: There are multiple subcentimeter acute infarcts scattered
throughout both cerebral hemispheres primarily involving cortex and
subcortical white matter of all lobes. Small acute infarcts are also
present in the left caudate head, both thalami, and right and
possibly left cerebellar hemispheres.

Restricted diffusion and T2 hyperintensity in the right basal
ganglia have mildly decreased. T2 hyperintensity involving the left
basal ganglia, left greater than right thalami, and left greater
than right internal and external capsules has also decreased with
decreased enlargement/swelling of the left caudate and left
thalamus. There is some intrinsic T1 hyperintensity associated with
the right basal ganglia infarct as well as some true, mild hazy as
well as vascular appearing enhancement. The small amount of
scattered enhancement within the left basal ganglia and left
thalamus on the prior MRI has decreased.

There may be a small amount of residual leptomeningeal enhancement
within biparietal sulci (lack of sagittal T1 postcontrast imaging on
today's study limits direct comparison). There is also minimal
persistent smooth pachymeningeal enhancement over both cerebral
convexities.

There is mild generalized cerebral atrophy. No intracranial
hemorrhage, midline shift, or extra-axial fluid collection is
evident.

Vascular: Major intracranial vascular flow voids are preserved.

Skull and upper cervical spine: Unremarkable bone marrow signal.

Sinuses/Orbits: Unremarkable orbits. Paranasal sinuses and mastoid
air cells are clear.

Other: None.
IMPRESSION: 1. Numerous small acute bilateral cerebral and cerebellar infarcts
compatible with emboli.
2. Expected evolution of right basal ganglia infarct.
3. Decreased T2 signal abnormality and enhancement in the left
greater than right deep gray nuclei and deep white matter tracts
with differential considerations as previously outlined.

## 2021-06-02 MED ORDER — GADOBUTROL 1 MMOL/ML IV SOLN
6.0000 mL | Freq: Once | INTRAVENOUS | Status: AC | PRN
  Administered 2021-06-02: 6 mL via INTRAVENOUS

## 2021-06-02 NOTE — Progress Notes (Signed)
SLP Cancellation Note  Patient Details Name: Michaela Rios MRN: [id number] DOB: [birth year]   Cancelled treatment:       Reason Eval/Treat Not Completed: Patient at procedure or test/unavailable. Per husband, she just left for MRI. He denies any concerns with swallowing and says that she has been taking her pills better with applesauce. Will continue to f/u for swallowing and cognitive therapy as able.     Osie Bond., M.A. [facility] Pager [phone] Office [phone]  06/02/2021, 3:10 PM

## 2021-06-02 NOTE — Consult Note (Signed)
[facility]  Subjective  - hand feels about the same   Objective 126/75 76 98 F (36.7 C) (Oral) 18 95%  Intake/Output Summary (Last 24 hours) at 06/02/2021 0753 Last data filed at 06/02/2021 0557 Gross per 24 hour  Intake 237 ml  Output 2000 ml  Net -1763 ml   Right upper extremity no pulsatile mass at wrist Hand intrinsics 4/5 pronation supination wrist flexion extension 4/5 mainly secondary to pain and edema  Assessment/Planning: No focal hematoma to drain as this is diffuse interstitial and intramuscular edema] Would recommend PT/OT eval to improve hand strength with time  Ruta Hinds 06/02/2021 7:53 AM --  Laboratory Lab Results: Recent Labs    05/30/21 1334  WBC 6.1  HGB 10.4*  HCT 31.6*  PLT 284   BMET Recent Labs    05/30/21 1334 06/01/21 0203  NA 135 137  K 3.9 3.5  CL 109 109  CO2 21* 20*  GLUCOSE 104* 146*  BUN 11 18  CREATININE 0.66 0.58  CALCIUM 8.7* 8.9    COAG Lab Results  Component Value Date   INR 1.1 05/24/2021   No results found for: PTT

## 2021-06-02 NOTE — Progress Notes (Signed)
Physical Therapy Treatment Patient Details Name: Michaela Rios MRN: [id number] DOB: [birth year] Today's Date: 06/02/2021    History of Present Illness Pt is a 70 y.o. female admitted 05/24/21 with c/o weakness, fall due to loss of balance, poor PO intake. Workup for sepsis, unclear etiology. Pt with confusion, neuro consult for possible CNS infection. Brain MRI 6/29 showed R basal ganglia stroke in addition to accelerated small vessel disease. PMH includes HTN, CKD 3, migraines, chronic LBP, prediabetes, anxiety, depression.    PT Comments    Pt tolerates treatment well. Pt defers use of R hand during session, not wanting to utilize walker or push during transfers with RUE due to swelling and pain. Pt continues to demonstrate instances of freezing and difficulty with motor planning during session. Motor planning does seem to be improved with less tactile cueing and without UE support of hand hold in 2nd ambulation attempt. Pt will continue to benefit from aggressive mobilization and acute PT POC to improve mobility and reduce falls risk.   Follow Up Recommendations  CIR;Supervision/Assistance - 24 hour     Equipment Recommendations  None recommended by PT    Recommendations for Other Services       Precautions / Restrictions Precautions Precautions: Fall Precaution Comments: urinary urgency/incontinence (has Depends in room) Restrictions Weight Bearing Restrictions: No    Mobility  Bed Mobility Overal bed mobility: Needs Assistance Bed Mobility: Sit to Supine       Sit to supine: Mod assist   General bed mobility comments: assistance to elevate BLE back to bed    Transfers Overall transfer level: Needs assistance Equipment used: None Transfers: Sit to/from Stand Sit to Stand: Min assist;Mod assist         General transfer comment: modA for initial transfer with multiple verbal cues for sequencing. PT provides initiation of rocking and minA to power into standing.  MinA for 2nd attempted sit to stand. Pt preferring to not utilize R hand due to swelling and discomfort  Ambulation/Gait Ambulation/Gait assistance: Min assist;Mod assist Gait Distance (Feet): 15 Feet (15' x 2) Assistive device: 1 person hand held assist;None Gait Pattern/deviations: Step-to pattern;Step-through pattern Gait velocity: reduced Gait velocity interpretation: <1.8 ft/sec, indicate of risk for recurrent falls General Gait Details: pt with slowed step-to or step-through gait. Pt freezing with attempts at turns. Pt with slowed gait speed with initial trial and PT hand hold. PT notes improved gait speed with reduced lateral sway without PT hand hold.   Stairs             Wheelchair Mobility    Modified Rankin (Stroke Patients Only) Modified Rankin (Stroke Patients Only) Pre-Morbid Rankin Score: No significant disability Modified Rankin: Moderately severe disability     Balance Overall balance assessment: Needs assistance Sitting-balance support: No upper extremity supported;Feet supported Sitting balance-Leahy Scale: Good     Standing balance support: No upper extremity supported Standing balance-Leahy Scale: Poor Standing balance comment: minA without UE support                            Cognition Arousal/Alertness: Awake/alert Behavior During Therapy: WFL for tasks assessed/performed Overall Cognitive Status: Impaired/Different from baseline Area of Impairment: Problem solving                             Problem Solving: Slow processing;Decreased initiation;Difficulty sequencing        Exercises  General Comments General comments (skin integrity, edema, etc.): VSS on RA      Pertinent Vitals/Pain Pain Assessment: Faces Faces Pain Scale: Hurts little more Pain Location: R hand Pain Descriptors / Indicators: Sore Pain Intervention(s): Monitored during session    Home Living                      Prior  Function            PT Goals (current goals can now be found in the care plan section) Acute Rehab PT Goals Patient Stated Goal: Ready to go to rehab Progress towards PT goals: Progressing toward goals    Frequency    Min 4X/week      PT Plan Current plan remains appropriate    Co-evaluation              AM-PAC PT "6 Clicks" Mobility   Outcome Measure  Help needed turning from your back to your side while in a flat bed without using bedrails?: A Little Help needed moving from lying on your back to sitting on the side of a flat bed without using bedrails?: A Little Help needed moving to and from a bed to a chair (including a wheelchair)?: A Little Help needed standing up from a chair using your arms (e.g., wheelchair or bedside chair)?: A Little Help needed to walk in hospital room?: A Little Help needed climbing 3-5 steps with a railing? : Total 6 Click Score: 16    End of Session Equipment Utilized During Treatment: Gait belt Activity Tolerance: Patient tolerated treatment well Patient left: in bed;with call bell/phone within reach;with family/visitor present (transport present to take patient to MRI) Nurse Communication: Mobility status PT Visit Diagnosis: Other abnormalities of gait and mobility (R26.89);Muscle weakness (generalized) (M62.81);Other symptoms and signs involving the nervous system (R29.898)     Time: 4034-7425 PT Time Calculation (min) (ACUTE ONLY): 42 min  Charges:  $Gait Training: 23-37 mins $Therapeutic Activity: 8-22 mins                     Zenaida Niece, PT, DPT Acute Rehabilitation Pager: [phone]    Zenaida Niece 06/02/2021, 2:38 PM

## 2021-06-02 NOTE — Progress Notes (Signed)
Referring Physician(s): * No referring provider recorded for this case *  Supervising Physician: Michaela Rios  Patient Status:  [facility] - In-pt  Chief Complaint: Vasculitis R dorsal hand hematoma  Subjective: Patient resting comfortably.  Ice pack over dorsal hand.  Improved swelling, now with diffuse, generalized swelling of the right hand with evidence of bruising.  No exquisite tenderness, however difficultly with grip due to swelling.   Allergies: Amitriptyline hcl, Shellfish allergy, Amoxicillin, Cephalexin, Lisinopril, and Penicillins  Medications: Prior to Admission medications   Medication Sig Start Date End Date Taking? Authorizing Provider  acetaminophen (TYLENOL) 650 MG CR tablet Take 650-1,300 mg by mouth every 8 (eight) hours as needed for pain.   Yes [provider]  amLODipine (NORVASC) 5 MG tablet TAKE ONE TABLET BY MOUTH EVERY MORNING Patient taking differently: Take 5 mg by mouth in the morning. 11/19/20  Yes Burns, Claudina Lick, MD  buPROPion (WELLBUTRIN XL) 300 MG 24 hr tablet TAKE ONE TABLET BY MOUTH EVERY MORNING Patient taking differently: Take 300 mg by mouth in the morning. 01/29/21  Yes Burns, Claudina Lick, MD  Cholecalciferol (VITAMIN D3 PO) Take 1 tablet by mouth daily.   Yes [provider]  EPINEPHrine (EPIPEN 2-PAK) 0.3 mg/0.3 mL IJ SOAJ injection Inject 0.3 mLs (0.3 mg total) into the muscle as needed for anaphylaxis. 08/15/19  Yes Burns, Claudina Lick, MD  furosemide (LASIX) 20 MG tablet TAKE ONE TABLET BY MOUTH EVERY MORNING Patient taking differently: Take 20 mg by mouth in the morning. 04/19/21  Yes Burns, Claudina Lick, MD  hydrALAZINE (APRESOLINE) 25 MG tablet TAKE ONE TABLET BY MOUTH EVERY MORNING and TAKE ONE TABLET BY MOUTH EVERYDAY AT BEDTIME Patient taking differently: Take 25 mg by mouth in the morning and at bedtime. 01/29/21  Yes Burns, Claudina Lick, MD  LORazepam (ATIVAN) 1 MG tablet Take 1 tablet (1 mg total) by mouth 2 (two) times  daily as needed. 04/29/21  Yes Burns, Claudina Lick, MD  metoprolol succinate (TOPROL-XL) 100 MG 24 hr tablet TAKE ONE TABLET BY MOUTH EVERY MORNING WITH OR immediately following A meal Patient taking differently: Take 100 mg by mouth daily with breakfast. 04/19/21  Yes Burns, Claudina Lick, MD  ondansetron (ZOFRAN) 4 MG tablet Take 1 tablet (4 mg total) by mouth every 6 (six) hours as needed for nausea. 08/19/20  Yes Pokhrel, Corrie Mckusick, MD  SUMAtriptan (IMITREX) 100 MG tablet Take one tablet (100 mg) by mouth at onset of migraine headache, may repeat in 2 hours if still needed Patient taking differently: Take 100 mg by mouth See admin instructions. Take one tablet (100 mg) by mouth at onset of migraine headache, may repeat in 2 hours if still needed 04/07/21  Yes Burns, Claudina Lick, MD  tiZANidine (ZANAFLEX) 4 MG tablet TAKE ONE TABLET BY MOUTH twice daily AS NEEDED FOR muscle SPASMS Patient taking differently: Take 4 mg by mouth 2 (two) times daily as needed for muscle spasms. 05/03/21  Yes Burns, Claudina Lick, MD  topiramate (TOPAMAX) 100 MG tablet Take 1.5 tablets (150 mg total) by mouth at bedtime. 02/02/21  Yes Burns, Claudina Lick, MD  venlafaxine XR (EFFEXOR-XR) 75 MG 24 hr capsule TAKE THREE CAPSULES BY MOUTH EVERY MORNING Patient taking differently: Take 75 mg by mouth daily with breakfast. 01/29/21  Yes Burns, Claudina Lick, MD  zolpidem (AMBIEN) 10 MG tablet TAKE ONE TABLET BY MOUTH EVERYDAY AT BEDTIME AS NEEDED FOR SLEEP Patient taking differently: Take 10 mg by mouth at  bedtime. 04/29/21  Yes Burns, Claudina Lick, MD  HYDROcodone-acetaminophen (NORCO) 7.5-325 MG tablet Take 1 tablet by mouth 2 (two) times daily as needed (migraine headache). 05/25/21   Binnie Rail, MD  nitrofurantoin, macrocrystal-monohydrate, (MACROBID) 100 MG capsule Take 1 capsule (100 mg total) by mouth 2 (two) times daily. Patient not taking: No sig reported 04/09/21   Binnie Rail, MD  omeprazole (PRILOSEC) 20 MG capsule Take 1 capsule (20 mg total) by mouth  daily. Patient not taking: No sig reported 05/15/20   Binnie Rail, MD     Vital Signs: BP 126/75   Pulse 76   Temp 98 F (36.7 C) (Oral)   Resp 18   Ht 5' 3" (1.6 m)   Wt 134 lb (60.8 kg)   SpO2 95%   BMI 23.74 kg/m   Physical Exam Vitals and nursing note reviewed.  Constitutional:      Appearance: Normal appearance.  Neurological:     Mental Status: She is alert.  NAD, alert, resting in bed Hand: generalize, dissipating swelling throughout the hand fading into the wrist.  No forearm or upper arm swelling.  Upper arm IV in place without known issue. Difficulty with flexion and hand grip due to degree of swelling.  No exquisite tenderness.  Radial puncture site intact.   Imaging: IR US Guide Vasc Access Right  Result Date: 06/01/2021 INDICATION: 70 year old female with past medical history significant for hypertension presenting with left-sided weakness and fever. 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 Given the possibility of CNS vasculitis, a diagnostic cerebral angiogram was requested. EXAM: ULTRASOUND-GUIDED VASCULAR ACCESS DIAGNOSTIC CEREBRAL ANGIOGRAM COMPARISON:  CT/CT angiogram of the head and neck May 26, 2021 MEDICATIONS: 5,000 IU heparin, 5 mg Verapamil and 200 mcg nitroglycerin ANESTHESIA/SEDATION: Versed 1 mg IV; Fentanyl 25 mcg IV Moderate Sedation Time:  46 minutes The patient was continuously monitored during the procedure by the interventional radiology nurse under my direct supervision. CONTRAST:  78 mL of Omnipaque 300 milligrams/mL FLUOROSCOPY TIME:  Fluoroscopy Time: 11 minutes 6 seconds (331 mGy). COMPLICATIONS: None immediate. TECHNIQUE: Informed written consent was obtained from the patient after a thorough discussion of the procedural risks, benefits and alternatives. All questions were addressed. Maximal Sterile Barrier Technique was utilized including caps, mask, sterile  gowns, sterile gloves, sterile drape, hand hygiene and skin antiseptic. A timeout was performed prior to the initiation of the procedure. Using the modified Seldinger technique and a micropuncture kit, access was gained to the distal right radial artery at the anatomical snuffbox and a 5 French sheath was placed. Slow intra arterial infusion of 5,000 IU heparin, 5 mg Verapamil and 200 mcg nitroglycerin diluted in patient's own blood was performed. No significant fluctuation in patient's blood pressure seen. Then, a right radial artery roadmap was obtained via sheath side port. Normal brachial artery branching pattern seen. No significant anatomical variation. The right radial artery caliber is adequate for vascular access. Next, a 5 [place] Simmons 2 glide catheter was navigated over a 0.035" Terumo Glidewire into the right subclavian artery under fluoroscopic guidance. Under fluoroscopy the catheter was placed into the right vertebral artery. Frontal, lateral and magnified bilateral oblique views of the head were obtained. The catheter was then placed into the right common carotid artery. Frontal and lateral views of the neck were obtained. Under biplane roadmap, the catheter was placed into the right internal carotid artery. Frontal, lateral and magnified  oblique views of the head were obtained. The catheter was subsequently placed into the right external carotid artery. Frontal and lateral angiograms of the head were obtained. Next, the catheter was placed into the left common carotid artery. Frontal and lateral views of the neck were obtained followed by frontal, lateral and magnified oblique views of the head. The catheter was subsequently withdrawn. An inflatable band was placed and inflated over the right hand access site. The vascular sheath was withdrawn and the band was slowly deflated until brisk flow was noted through the arteriotomy site. At this point, the band was reinflated with additional 2 cc of air  to obtain patent hemostasis. FINDINGS: Right radial artery ultrasound and right radial artery angiogram: The caliber of the distal right radial artery is appropriate for angiogram access. The right radial artery and the right ulnar artery have normal course and caliber. No significant anatomical variants noted. Right vertebral artery angiograms: The right vertebral artery, basilar artery, and bilateral posterior cerebral arteries are unremarkable. The left vertebral artery seen by contrast reflux and also appear unremarkable. Luminal caliber is smooth and tapering. No aneurysms or abnormally high-flow, early draining veins are seen. No regions of abnormal hypervascularity are noted. The visualized dural sinuses are patent. Right CCA angiograms: Cervical angiograms show normal course and caliber of the visualized right common carotid and internal carotid arteries. There are no significant stenoses. Right ICA angiograms: There is brisk vascular contrast filling of the ACA and MCA vascular trees. Luminal caliber is smooth and tapering. No aneurysms or abnormally high-flow, early draining veins are seen. No regions of abnormal hypervascularity are noted. The visualized dural sinuses are patent. Right ECA and right occipital angiograms: No early venous drainage was noted. The visualized branches of the right external carotid artery are unremarkable. Left CCA angiograms: Cervical angiograms show normal caliber of the visualized left common carotid and internal carotid arteries. Increased tortuosity of the mid cervical segment of the left ICA with mild luminal irregularity. There are no significant stenoses. Left CCA angiograms - cranial views: There is brisk vascular contrast filling of the ACA and MCA vascular trees. Luminal caliber is smooth and tapering. No aneurysms or abnormally high-flow, early draining veins are seen. No regions of abnormal hypervascularity are noted. The visualized dural sinuses are patent. The  visualized branches of the left external carotid artery are unremarkable. PROCEDURE: No intervention performed. IMPRESSION: 1. No angiographic evidence of vasculitis. 2. No significant intracranial stenosis, vasospasm, vascular occlusion, aneurysm, AVM, dural AV fistula or other significant vascular abnormality. PLAN: Results communicated to the neurology team. Electronically Signed   By: Michaela Rios M.D.   On: 06/01/2021 17:01   VAS [place] UPPER EXTREMITY ARTERIAL DUPLEX  Result Date: 06/01/2021  UPPER EXTREMITY DUPLEX STUDY Patient Name:  Michaela Rios [facility]  Date of Exam:   06/01/2021 Medical Rec #: [id number]          Accession #:    [id number] Date of Birth: 11-20-1951          Patient Gender: F Patient Age:   69Y Exam Location:  [facility] Procedure:      VAS [place] UPPER EXTREMITY ARTERIAL DUPLEX Referring Phys: Farson --------------------------------------------------------------------------------  Indications: Bruising and Pain. History:     Patient has a history of catheterization via left radial artery.  Risk Factors: Hypertension. Limitations: TR band in place on the right wrist. Comparison Study: No prior studies. Performing Technologist: Oliver Hum RVT  Examination Guidelines: A complete  evaluation includes B-mode imaging, spectral Doppler, color Doppler, and power Doppler as needed of all accessible portions of each vessel. Bilateral testing is considered an integral part of a complete examination. Limited examinations for reoccurring indications may be performed as noted.  Right Doppler Findings: +---------------+----------+---------+--------+--------+ Site           PSV (cm/s)Waveform StenosisComments +---------------+----------+---------+--------+--------+ Subclavian Dist          triphasic                 +---------------+----------+---------+--------+--------+ Brachial Dist            triphasic                  +---------------+----------+---------+--------+--------+ Radial Dist              triphasic                 +---------------+----------+---------+--------+--------+ Ulnar Dist               triphasic                 +---------------+----------+---------+--------+--------+ Palmar Arch              triphasic                 +---------------+----------+---------+--------+--------+ There is no evidence of a pseudoaneurysm. There is evidence of a channel leading off of the distal radial artery. The channel is located beneath the TR band.     Preliminary    IR ANGIO INTRA EXTRACRAN SEL COM CAROTID INNOMINATE UNI L MOD SED  Result Date: 06/01/2021 INDICATION: 70 year old female with past medical history significant for hypertension presenting with left-sided weakness and fever. 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 Given the possibility of CNS vasculitis, a diagnostic cerebral angiogram was requested. EXAM: ULTRASOUND-GUIDED VASCULAR ACCESS DIAGNOSTIC CEREBRAL ANGIOGRAM COMPARISON:  CT/CT angiogram of the head and neck May 26, 2021 MEDICATIONS: 5,000 IU heparin, 5 mg Verapamil and 200 mcg nitroglycerin ANESTHESIA/SEDATION: Versed 1 mg IV; Fentanyl 25 mcg IV Moderate Sedation Time:  46 minutes The patient was continuously monitored during the procedure by the interventional radiology nurse under my direct supervision. CONTRAST:  78 mL of Omnipaque 300 milligrams/mL FLUOROSCOPY TIME:  Fluoroscopy Time: 11 minutes 6 seconds (331 mGy). COMPLICATIONS: None immediate. TECHNIQUE: Informed written consent was obtained from the patient after a thorough discussion of the procedural risks, benefits and alternatives. All questions were addressed. Maximal Sterile Barrier Technique was utilized including caps, mask, sterile gowns, sterile gloves, sterile drape, hand hygiene and skin antiseptic. A timeout was performed prior to  the initiation of the procedure. Using the modified Seldinger technique and a micropuncture kit, access was gained to the distal right radial artery at the anatomical snuffbox and a 5 French sheath was placed. Slow intra arterial infusion of 5,000 IU heparin, 5 mg Verapamil and 200 mcg nitroglycerin diluted in patient's own blood was performed. No significant fluctuation in patient's blood pressure seen. Then, a right radial artery roadmap was obtained via sheath side port. Normal brachial artery branching pattern seen. No significant anatomical variation. The right radial artery caliber is adequate for vascular access. Next, a 5 [place] Simmons 2 glide catheter was navigated over a 0.035" Terumo Glidewire into the right subclavian artery under fluoroscopic guidance. Under fluoroscopy the catheter was placed into the right vertebral artery. Frontal, lateral and magnified bilateral oblique views of the head were obtained. The  catheter was then placed into the right common carotid artery. Frontal and lateral views of the neck were obtained. Under biplane roadmap, the catheter was placed into the right internal carotid artery. Frontal, lateral and magnified oblique views of the head were obtained. The catheter was subsequently placed into the right external carotid artery. Frontal and lateral angiograms of the head were obtained. Next, the catheter was placed into the left common carotid artery. Frontal and lateral views of the neck were obtained followed by frontal, lateral and magnified oblique views of the head. The catheter was subsequently withdrawn. An inflatable band was placed and inflated over the right hand access site. The vascular sheath was withdrawn and the band was slowly deflated until brisk flow was noted through the arteriotomy site. At this point, the band was reinflated with additional 2 cc of air to obtain patent hemostasis. FINDINGS: Right radial artery ultrasound and right radial artery angiogram:  The caliber of the distal right radial artery is appropriate for angiogram access. The right radial artery and the right ulnar artery have normal course and caliber. No significant anatomical variants noted. Right vertebral artery angiograms: The right vertebral artery, basilar artery, and bilateral posterior cerebral arteries are unremarkable. The left vertebral artery seen by contrast reflux and also appear unremarkable. Luminal caliber is smooth and tapering. No aneurysms or abnormally high-flow, early draining veins are seen. No regions of abnormal hypervascularity are noted. The visualized dural sinuses are patent. Right CCA angiograms: Cervical angiograms show normal course and caliber of the visualized right common carotid and internal carotid arteries. There are no significant stenoses. Right ICA angiograms: There is brisk vascular contrast filling of the ACA and MCA vascular trees. Luminal caliber is smooth and tapering. No aneurysms or abnormally high-flow, early draining veins are seen. No regions of abnormal hypervascularity are noted. The visualized dural sinuses are patent. Right ECA and right occipital angiograms: No early venous drainage was noted. The visualized branches of the right external carotid artery are unremarkable. Left CCA angiograms: Cervical angiograms show normal caliber of the visualized left common carotid and internal carotid arteries. Increased tortuosity of the mid cervical segment of the left ICA with mild luminal irregularity. There are no significant stenoses. Left CCA angiograms - cranial views: There is brisk vascular contrast filling of the ACA and MCA vascular trees. Luminal caliber is smooth and tapering. No aneurysms or abnormally high-flow, early draining veins are seen. No regions of abnormal hypervascularity are noted. The visualized dural sinuses are patent. The visualized branches of the left external carotid artery are unremarkable. PROCEDURE: No intervention  performed. IMPRESSION: 1. No angiographic evidence of vasculitis. 2. No significant intracranial stenosis, vasospasm, vascular occlusion, aneurysm, AVM, dural AV fistula or other significant vascular abnormality. PLAN: Results communicated to the neurology team. Electronically Signed   By: Michaela Rios M.D.   On: 06/01/2021 17:01   IR ANGIO INTRA EXTRACRAN SEL INTERNAL CAROTID UNI R MOD SED  Result Date: 06/01/2021 INDICATION: 70 year old female with past medical history significant for hypertension presenting with left-sided weakness and fever. 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 Given the possibility of CNS vasculitis, a diagnostic cerebral angiogram was requested. EXAM: ULTRASOUND-GUIDED VASCULAR ACCESS DIAGNOSTIC CEREBRAL ANGIOGRAM COMPARISON:  CT/CT angiogram of the head and neck May 26, 2021 MEDICATIONS: 5,000 IU heparin, 5 mg Verapamil and 200 mcg nitroglycerin ANESTHESIA/SEDATION: Versed 1 mg IV; Fentanyl 25 mcg IV Moderate Sedation Time:  46 minutes The patient was continuously monitored during the procedure by the interventional radiology nurse under my direct supervision. CONTRAST:  78 mL of Omnipaque 300 milligrams/mL FLUOROSCOPY TIME:  Fluoroscopy Time: 11 minutes 6 seconds (331 mGy). COMPLICATIONS: None immediate. TECHNIQUE: Informed written consent was obtained from the patient after a thorough discussion of the procedural risks, benefits and alternatives. All questions were addressed. Maximal Sterile Barrier Technique was utilized including caps, mask, sterile gowns, sterile gloves, sterile drape, hand hygiene and skin antiseptic. A timeout was performed prior to the initiation of the procedure. Using the modified Seldinger technique and a micropuncture kit, access was gained to the distal right radial artery at the anatomical snuffbox and a 5 French sheath was placed. Slow intra arterial  infusion of 5,000 IU heparin, 5 mg Verapamil and 200 mcg nitroglycerin diluted in patient's own blood was performed. No significant fluctuation in patient's blood pressure seen. Then, a right radial artery roadmap was obtained via sheath side port. Normal brachial artery branching pattern seen. No significant anatomical variation. The right radial artery caliber is adequate for vascular access. Next, a 5 [place] Simmons 2 glide catheter was navigated over a 0.035" Terumo Glidewire into the right subclavian artery under fluoroscopic guidance. Under fluoroscopy the catheter was placed into the right vertebral artery. Frontal, lateral and magnified bilateral oblique views of the head were obtained. The catheter was then placed into the right common carotid artery. Frontal and lateral views of the neck were obtained. Under biplane roadmap, the catheter was placed into the right internal carotid artery. Frontal, lateral and magnified oblique views of the head were obtained. The catheter was subsequently placed into the right external carotid artery. Frontal and lateral angiograms of the head were obtained. Next, the catheter was placed into the left common carotid artery. Frontal and lateral views of the neck were obtained followed by frontal, lateral and magnified oblique views of the head. The catheter was subsequently withdrawn. An inflatable band was placed and inflated over the right hand access site. The vascular sheath was withdrawn and the band was slowly deflated until brisk flow was noted through the arteriotomy site. At this point, the band was reinflated with additional 2 cc of air to obtain patent hemostasis. FINDINGS: Right radial artery ultrasound and right radial artery angiogram: The caliber of the distal right radial artery is appropriate for angiogram access. The right radial artery and the right ulnar artery have normal course and caliber. No significant anatomical variants noted. Right vertebral  artery angiograms: The right vertebral artery, basilar artery, and bilateral posterior cerebral arteries are unremarkable. The left vertebral artery seen by contrast reflux and also appear unremarkable. Luminal caliber is smooth and tapering. No aneurysms or abnormally high-flow, early draining veins are seen. No regions of abnormal hypervascularity are noted. The visualized dural sinuses are patent. Right CCA angiograms: Cervical angiograms show normal course and caliber of the visualized right common carotid and internal carotid arteries. There are no significant stenoses. Right ICA angiograms: There is brisk vascular contrast filling of the ACA and MCA vascular trees. Luminal caliber is smooth and tapering. No aneurysms or abnormally high-flow, early draining veins are seen. No regions of abnormal hypervascularity are noted. The visualized dural sinuses are patent. Right ECA and right occipital angiograms: No early venous drainage was noted. The visualized branches of the right external carotid artery are unremarkable. Left CCA angiograms: Cervical angiograms show normal caliber of the visualized left common carotid and internal carotid arteries. Increased tortuosity of the  mid cervical segment of the left ICA with mild luminal irregularity. There are no significant stenoses. Left CCA angiograms - cranial views: There is brisk vascular contrast filling of the ACA and MCA vascular trees. Luminal caliber is smooth and tapering. No aneurysms or abnormally high-flow, early draining veins are seen. No regions of abnormal hypervascularity are noted. The visualized dural sinuses are patent. The visualized branches of the left external carotid artery are unremarkable. PROCEDURE: No intervention performed. IMPRESSION: 1. No angiographic evidence of vasculitis. 2. No significant intracranial stenosis, vasospasm, vascular occlusion, aneurysm, AVM, dural AV fistula or other significant vascular abnormality. PLAN: Results  communicated to the neurology team. Electronically Signed   By: Michaela Rios M.D.   On: 06/01/2021 17:01   IR ANGIO VERTEBRAL SEL VERTEBRAL UNI R MOD SED  Result Date: 06/01/2021 INDICATION: 70 year old female with past medical history significant for hypertension presenting with left-sided weakness and fever. 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 Given the possibility of CNS vasculitis, a diagnostic cerebral angiogram was requested. EXAM: ULTRASOUND-GUIDED VASCULAR ACCESS DIAGNOSTIC CEREBRAL ANGIOGRAM COMPARISON:  CT/CT angiogram of the head and neck May 26, 2021 MEDICATIONS: 5,000 IU heparin, 5 mg Verapamil and 200 mcg nitroglycerin ANESTHESIA/SEDATION: Versed 1 mg IV; Fentanyl 25 mcg IV Moderate Sedation Time:  46 minutes The patient was continuously monitored during the procedure by the interventional radiology nurse under my direct supervision. CONTRAST:  78 mL of Omnipaque 300 milligrams/mL FLUOROSCOPY TIME:  Fluoroscopy Time: 11 minutes 6 seconds (331 mGy). COMPLICATIONS: None immediate. TECHNIQUE: Informed written consent was obtained from the patient after a thorough discussion of the procedural risks, benefits and alternatives. All questions were addressed. Maximal Sterile Barrier Technique was utilized including caps, mask, sterile gowns, sterile gloves, sterile drape, hand hygiene and skin antiseptic. A timeout was performed prior to the initiation of the procedure. Using the modified Seldinger technique and a micropuncture kit, access was gained to the distal right radial artery at the anatomical snuffbox and a 5 French sheath was placed. Slow intra arterial infusion of 5,000 IU heparin, 5 mg Verapamil and 200 mcg nitroglycerin diluted in patient's own blood was performed. No significant fluctuation in patient's blood pressure seen. Then, a right radial artery roadmap was obtained via sheath side  port. Normal brachial artery branching pattern seen. No significant anatomical variation. The right radial artery caliber is adequate for vascular access. Next, a 5 [place] Simmons 2 glide catheter was navigated over a 0.035" Terumo Glidewire into the right subclavian artery under fluoroscopic guidance. Under fluoroscopy the catheter was placed into the right vertebral artery. Frontal, lateral and magnified bilateral oblique views of the head were obtained. The catheter was then placed into the right common carotid artery. Frontal and lateral views of the neck were obtained. Under biplane roadmap, the catheter was placed into the right internal carotid artery. Frontal, lateral and magnified oblique views of the head were obtained. The catheter was subsequently placed into the right external carotid artery. Frontal and lateral angiograms of the head were obtained. Next, the catheter was placed into the left common carotid artery. Frontal and lateral views of the neck were obtained followed by frontal, lateral and magnified oblique views of the head. The catheter was subsequently withdrawn. An inflatable band was placed and inflated over the right hand access site. The vascular sheath was withdrawn and the band was slowly deflated until brisk flow was noted through the arteriotomy  site. At this point, the band was reinflated with additional 2 cc of air to obtain patent hemostasis. FINDINGS: Right radial artery ultrasound and right radial artery angiogram: The caliber of the distal right radial artery is appropriate for angiogram access. The right radial artery and the right ulnar artery have normal course and caliber. No significant anatomical variants noted. Right vertebral artery angiograms: The right vertebral artery, basilar artery, and bilateral posterior cerebral arteries are unremarkable. The left vertebral artery seen by contrast reflux and also appear unremarkable. Luminal caliber is smooth and tapering. No  aneurysms or abnormally high-flow, early draining veins are seen. No regions of abnormal hypervascularity are noted. The visualized dural sinuses are patent. Right CCA angiograms: Cervical angiograms show normal course and caliber of the visualized right common carotid and internal carotid arteries. There are no significant stenoses. Right ICA angiograms: There is brisk vascular contrast filling of the ACA and MCA vascular trees. Luminal caliber is smooth and tapering. No aneurysms or abnormally high-flow, early draining veins are seen. No regions of abnormal hypervascularity are noted. The visualized dural sinuses are patent. Right ECA and right occipital angiograms: No early venous drainage was noted. The visualized branches of the right external carotid artery are unremarkable. Left CCA angiograms: Cervical angiograms show normal caliber of the visualized left common carotid and internal carotid arteries. Increased tortuosity of the mid cervical segment of the left ICA with mild luminal irregularity. There are no significant stenoses. Left CCA angiograms - cranial views: There is brisk vascular contrast filling of the ACA and MCA vascular trees. Luminal caliber is smooth and tapering. No aneurysms or abnormally high-flow, early draining veins are seen. No regions of abnormal hypervascularity are noted. The visualized dural sinuses are patent. The visualized branches of the left external carotid artery are unremarkable. PROCEDURE: No intervention performed. IMPRESSION: 1. No angiographic evidence of vasculitis. 2. No significant intracranial stenosis, vasospasm, vascular occlusion, aneurysm, AVM, dural AV fistula or other significant vascular abnormality. PLAN: Results communicated to the neurology team. Electronically Signed   By: Michaela Rios M.D.   On: 06/01/2021 17:01   IR ANGIO EXTERNAL CAROTID SEL EXT CAROTID UNI R MOD SED  Result Date: 06/01/2021 INDICATION: 70 year old female with past  medical history significant for hypertension presenting with left-sided weakness and fever. 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 Given the possibility of CNS vasculitis, a diagnostic cerebral angiogram was requested. EXAM: ULTRASOUND-GUIDED VASCULAR ACCESS DIAGNOSTIC CEREBRAL ANGIOGRAM COMPARISON:  CT/CT angiogram of the head and neck May 26, 2021 MEDICATIONS: 5,000 IU heparin, 5 mg Verapamil and 200 mcg nitroglycerin ANESTHESIA/SEDATION: Versed 1 mg IV; Fentanyl 25 mcg IV Moderate Sedation Time:  46 minutes The patient was continuously monitored during the procedure by the interventional radiology nurse under my direct supervision. CONTRAST:  78 mL of Omnipaque 300 milligrams/mL FLUOROSCOPY TIME:  Fluoroscopy Time: 11 minutes 6 seconds (331 mGy). COMPLICATIONS: None immediate. TECHNIQUE: Informed written consent was obtained from the patient after a thorough discussion of the procedural risks, benefits and alternatives. All questions were addressed. Maximal Sterile Barrier Technique was utilized including caps, mask, sterile gowns, sterile gloves, sterile drape, hand hygiene and skin antiseptic. A timeout was performed prior to the initiation of the procedure. Using the modified Seldinger technique and a micropuncture kit, access was gained to the distal right radial artery at the anatomical snuffbox and a 5 French sheath was placed. Slow intra arterial infusion of  5,000 IU heparin, 5 mg Verapamil and 200 mcg nitroglycerin diluted in patient's own blood was performed. No significant fluctuation in patient's blood pressure seen. Then, a right radial artery roadmap was obtained via sheath side port. Normal brachial artery branching pattern seen. No significant anatomical variation. The right radial artery caliber is adequate for vascular access. Next, a 5 [place] Simmons 2 glide catheter was navigated over a 0.035" Terumo  Glidewire into the right subclavian artery under fluoroscopic guidance. Under fluoroscopy the catheter was placed into the right vertebral artery. Frontal, lateral and magnified bilateral oblique views of the head were obtained. The catheter was then placed into the right common carotid artery. Frontal and lateral views of the neck were obtained. Under biplane roadmap, the catheter was placed into the right internal carotid artery. Frontal, lateral and magnified oblique views of the head were obtained. The catheter was subsequently placed into the right external carotid artery. Frontal and lateral angiograms of the head were obtained. Next, the catheter was placed into the left common carotid artery. Frontal and lateral views of the neck were obtained followed by frontal, lateral and magnified oblique views of the head. The catheter was subsequently withdrawn. An inflatable band was placed and inflated over the right hand access site. The vascular sheath was withdrawn and the band was slowly deflated until brisk flow was noted through the arteriotomy site. At this point, the band was reinflated with additional 2 cc of air to obtain patent hemostasis. FINDINGS: Right radial artery ultrasound and right radial artery angiogram: The caliber of the distal right radial artery is appropriate for angiogram access. The right radial artery and the right ulnar artery have normal course and caliber. No significant anatomical variants noted. Right vertebral artery angiograms: The right vertebral artery, basilar artery, and bilateral posterior cerebral arteries are unremarkable. The left vertebral artery seen by contrast reflux and also appear unremarkable. Luminal caliber is smooth and tapering. No aneurysms or abnormally high-flow, early draining veins are seen. No regions of abnormal hypervascularity are noted. The visualized dural sinuses are patent. Right CCA angiograms: Cervical angiograms show normal course and caliber of  the visualized right common carotid and internal carotid arteries. There are no significant stenoses. Right ICA angiograms: There is brisk vascular contrast filling of the ACA and MCA vascular trees. Luminal caliber is smooth and tapering. No aneurysms or abnormally high-flow, early draining veins are seen. No regions of abnormal hypervascularity are noted. The visualized dural sinuses are patent. Right ECA and right occipital angiograms: No early venous drainage was noted. The visualized branches of the right external carotid artery are unremarkable. Left CCA angiograms: Cervical angiograms show normal caliber of the visualized left common carotid and internal carotid arteries. Increased tortuosity of the mid cervical segment of the left ICA with mild luminal irregularity. There are no significant stenoses. Left CCA angiograms - cranial views: There is brisk vascular contrast filling of the ACA and MCA vascular trees. Luminal caliber is smooth and tapering. No aneurysms or abnormally high-flow, early draining veins are seen. No regions of abnormal hypervascularity are noted. The visualized dural sinuses are patent. The visualized branches of the left external carotid artery are unremarkable. PROCEDURE: No intervention performed. IMPRESSION: 1. No angiographic evidence of vasculitis. 2. No significant intracranial stenosis, vasospasm, vascular occlusion, aneurysm, AVM, dural AV fistula or other significant vascular abnormality. PLAN: Results communicated to the neurology team. Electronically Signed   By: Michaela Rios M.D.   On: 06/01/2021 17:01  Labs:  CBC: Recent Labs    05/27/21 0106 05/28/21 0211 05/30/21 1334 06/02/21 0826  WBC 7.2 6.6 6.1 10.0  HGB 10.7* 10.3* 10.4* 11.5*  HCT 32.2* 30.6* 31.6* 34.8*  PLT 249 231 284 348    COAGS: Recent Labs    05/24/21 1150  INR 1.1  APTT 32    BMP: Recent Labs    08/17/20 0203 08/17/20 2241 08/18/20 0021 08/18/20 0454  08/19/20 0745 12/04/20 1549 05/27/21 0106 05/28/21 0211 05/30/21 1334 06/01/21 0203  NA 138 139   < > 140 137   < > 130* 134* 135 137  K 3.1* 3.6   < > 3.2* 3.6   < > 3.4* 3.3* 3.9 3.5  CL 104 106  --  110 107   < > 102 107 109 109  CO2 18* 19*  --  20* 19*   < > 18* 20* 21* 20*  GLUCOSE 111* 93  --  88 98   < > 97 107* 104* 146*  BUN 37* 31*  --  27* 8   < > _0 CALCIUM 10.1 9.4  --  9.1 9.2   < > 8.4* 8.2* 8.7* 8.9  CREATININE 1.31* 0.93  --  0.89 0.72   < > 0.74 0.64 0.66 0.58  GFRNONAA 41* >60  --  >60 >60   < > >60 >60 >60 >60  GFRAA 48* >60  --  >60 >60  --   --   --   --   --    < > = values in this interval not displayed.    LIVER FUNCTION TESTS: Recent Labs    12/04/20 1549 04/07/21 1450 05/24/21 1005 05/26/21 0236  BILITOT 0.3 0.3 0.5 0.6  AST 32 _1 ALT 36* _2 ALKPHOS 94 104 75 54  PROT 7.9 7.6 7.7 6.4*  ALBUMIN 4.4 4.1 3.4* 2.9*    Assessment and Plan: Vasculitis s/p diagnostic angiogram Patient with dorsal hand swelling post procedure yesterday.  No bleeding or oozing from puncture site. TR band removed, site intact.  Appreciate Vascular surgery involvement and recommendations.  Diagnostic angiogram unrevealing of any vascular abnormality.  Continue current interventions. Remains on lovenox, plavix, and aspirin.   Electronically Signed: Docia Barrier, PA 06/02/2021, 9:44 AM   I spent a total of 15 Minutes at the the patient's bedside AND on the patient's hospital floor or unit, greater than 50% of which was counseling/coordinating care for vasculitis.

## 2021-06-02 NOTE — Progress Notes (Signed)
PROGRESS NOTE    Michaela Rios  YOV:[id number] DOB: [birth year] DOA: 05/24/2021 PCP: Binnie Rail, MD    Brief Narrative:  Michaela Rios is a 70 year old female with past medical history significant for essential hypertension, prediabetes, depression, CKD stage III, history of peptic ulcer disease, chronic low back pain, migraine headache who presented to [facility] on 6/27 with generalized weakness, loss of balance.  Patient reports she was going to the bathroom lost her balance and fell.  She has been having some chills.  Patient also reports poor oral intake over the last few days with increased urinary frequency but without dysuria.  No nausea/vomiting/diarrhea, no chest pain, no palpitations, no abdominal pain.  In the ED, BP 164/116, HR 141, RR 17, temperature 101.4 F, SPO2 96% on room air.  Sodium 139, potassium 2.8, chloride 105, CO2 22, glucose 121, BUN 9, creatinine 0.77.  AST 24, ALT 16, total bilirubin 0.5.  WBC 8.6, hemoglobin 11.6, platelets 243.  Lactic acid 2.1.  Urinalysis negative.  COVID-19 PCR negative.  Influenza A/B PCR negative.  D-dimer elevated 2.51.  Chest x-ray with no acute cardiopulmonary disease process.  CT renal stone study with no acute obstructing urinary tract or ureteral calculus, no uropathy or hydronephrosis, no acute intra-abdominal or pelvic finding.  CT angiogram chest with no evidence of acute pulmonary embolism, 4 mm right middle lobe peripheral nodular opacity.   Assessment & Plan:   Principal Problem:   Sepsis ([place]) Active Problems:   Anxiety   Depression   Essential hypertension   Prediabetes   Hypokalemia   Tachycardia   Cerebral thrombosis with cerebral infarction   SIRS Fever of unknown origin Patient presenting to the ED with weakness, fever with temperature 101.4 F, tachycardia, tachypnea and mild elevation of lactic acid with associated confusion.  Chest x-ray negative for acute cardiopulmonary disease process.  Urinalysis negative  for infection.  COVID/influenza PCR negative.  Blood cultures and urine culture with no growth.  CT abdomen/pelvis with no acute intra-abdominal/pelvic process.  Underwent lumbar puncture on 6/30.  Patient was initially started on the broad-spectrum antibiotics with gentamicin, vancomycin, Flagyl, aztreonam.  Infectious disease was initially consulted, antibiotics were discontinued with negative infectious work-up.  ESR 37, CRP 6.8.  RPR negative, VR DL nonreactive.  ANA positive, double-stranded DNA negative.  ACE level within normal limits. Rheumatoid factor negative.  B12 254, B1 level normal, TSH within normal limits.  Acute hepatitis panel negative.  Complement levels within normal limits. Cerebral angiogram 7/5; no angiographic evidence of vasculitis, no intracranial stenosis/vasospasm, vascular occlusion, aneurysm, AVM, dural AV fistula or other significant vascular abnormality.  CT head with new multifocal hypoattenuation basal ganglia and thalamus bilaterally of indeterminate etiology, no mass-effect to suggest tumor.  MR brain with right basal ganglia infarct, hyperdensity and mild enlargement of the left head of the caudate, left thalamus and small amount of hyperintensity in the right medial thalamus with recommendation of postcontrast MRI for further evaluation.  MRI with contrast with mild largely vascular perivascular appearing enhancement in the areas of the signal abnormality in the deep gray nuclei as well as small amount of leptomeningeal enhancement over the right and possibly left parietal convexities, no solid mass enhancing concerning for cryptococcus, vasculitis, neoplasm, angiocentric lymphoma versus granulomatosis, autoimmune or inflammatory condition. --Neurology following, appreciate assistance --Completed 3-day course of Solu-Medrol 1 g IV q24h on 7/5 --Remains afebrile --CSF cytology: Pending --CSF oligoclonal bands: Pending --ANCA: pending --CSF paraneoplastic panel:  Pending --TEE: pending to rule out  marantic endocarditis and PFO; planned for 06/03/21 --Neurology repeating MRI brain with and without contrast today  Acute right basal ganglia infarct Patient presenting to the ED with progressive weakness, gait disturbance.  Initially thought to be infectious, chest x-ray, CT angiogram chest, and urinalysis unrevealing.  Brain with right basal ganglia infarct and hyperintensity and mild enlargement of the left head of the caudate, left thalamus and small amount of hyperintensity right medial thalamus.  Hemoglobin A1c 5.9.  LDL 45.  TTE with LVEF 60 to 65%, no LV regional wall motion normalities, grade 1 diastolic dysfunction, mild MR, no aortic valve stenosis, IVC normal in size. --Aspirin 81 mg p.o. daily --Plavix 75 mg p.o. daily --No statin as LDL below goal <70 (45) --Continue PT/OT efforts while inpatient. --Pending insurance authorization for CIR  Right hand hematoma Patient underwent cerebral angiogram via right radial artery catheterization on 7/5 with resultant right dorsal hand hematoma.  Left upper extremity arterial duplex negative for pseudoaneurysm.  Seen by vascular surgery with no focal hematoma to drain as this is diffuse interstitial and intramuscular edema. --PT/OT efforts  Right middle lobe nodule Incidental finding of a 4 mm right middle lobe peripheral nodular opacity.  Recommend repeat CT chest 12 months.  Elevated D-dimer D-dimer elevated 2.51 admission, bilateral duplex ultrasound lower extremities negative for DVT, CT angiogram chest negative for PE.  Essential hypertension BP 144/89 this morning. --Amlodipine 5 mg p.o. daily --Hydralazine 25 mg p.o. twice daily --Metoprolol succinate 100 mg p.o. daily  Depression:  --Venlafaxine 75 mg p.o. daily --Wellbutrin 300 mg p.o. daily  Anxiety: Lorazepam 1 mg twice daily as needed  Migraine headaches: Topamax 150 mg p.o. nightly, sumatriptan as needed   DVT prophylaxis:  enoxaparin (LOVENOX) injection 40 mg Start: 06/02/21 1000   Code Status: Full Code Family Communication: None present at bedside this morning.  Disposition Plan:  Level of care: Telemetry Medical Status is: Inpatient  Remains inpatient appropriate because:Ongoing diagnostic testing needed not appropriate for outpatient work up, Unsafe d/c plan, IV treatments appropriate due to intensity of illness or inability to take PO, and Inpatient level of care appropriate due to severity of illness  Dispo: The patient is from: Home              Anticipated d/c is to: CIR              Patient currently is not medically stable to d/c.   Difficult to place patient No    Consultants:  Infectious disease Neurology Vascular surgery Interventional radiology Cardiology  Procedures:  Lumbar puncture 6/30 Cerebral angiogram 7/5; no angiographic evidence of vasculitis, no intracranial stenosis/vasospasm, vascular occlusion, aneurysm, AVM, dural AV fistula or other significant vascular abnormality  Antimicrobials:  Gentamicin 6/27 - 6/27 Flagyl 6/27 - 6/29 Vancomycin 6/27 - 6/29 Aztreonam 6/27 - 7/1  Subjective: Patient seen examined bedside, resting comfortably.  Continues to complain of hand pain from recent angiogram.  Pending TEE for tomorrow and repeat MR brain per neurology today.  Also awaiting insurance authorization for CIR.  No other questions or concerns at this time.  No family present at bedside this morning.  Patient denies headache, no chest pain, no palpitations, no shortness of breath, no abdominal pain.  No acute events overnight per nursing staff.  Objective: Vitals:   06/01/21 1635 06/01/21 2100 06/02/21 0500 06/02/21 0652  BP: (!) 145/93 (!) 162/88 (!) 144/89 126/75  Pulse: (!) 103 83 76   Resp:  18 18   Temp: 97.9  F (36.6 C) 98.3 F (36.8 C) 98 F (36.7 C)   TempSrc: Axillary Oral Oral   SpO2: (!) 89% 99% 95%   Weight:      Height:        Intake/Output Summary  (Last 24 hours) at 06/02/2021 1338 Last data filed at 06/02/2021 0557 Gross per 24 hour  Intake 237 ml  Output 2000 ml  Net -1763 ml   Filed Weights   05/24/21 0956  Weight: 60.8 kg    Examination:  General exam: Appears calm and comfortable  Respiratory system: Clear to auscultation. Respiratory effort normal.  On room air Cardiovascular system: S1 & S2 heard, RRR. No JVD, murmurs, rubs, gallops or clicks. No pedal edema. Gastrointestinal system: Abdomen is nondistended, soft and nontender. No organomegaly or masses felt. Normal bowel sounds heard. Central nervous system: Alert and oriented. No focal neurological deficits.  Sensation to light touch intact. Extremities: Moves all extremities independently, muscle strength 5/5 RLE, right hand limited by pain/hematoma, LUE 4/5 and LLE 4+/5.  Skin: No rashes, lesions or ulcers Psychiatry: Judgement and insight appear normal. Mood & affect appropriate.     Data Reviewed: I have personally reviewed following labs and imaging studies  CBC: Recent Labs  Lab 05/27/21 0106 05/28/21 0211 05/30/21 1334 06/02/21 0826  WBC 7.2 6.6 6.1 10.0  HGB 10.7* 10.3* 10.4* 11.5*  HCT 32.2* 30.6* 31.6* 34.8*  MCV 88.7 89.0 89.5 89.5  PLT 249 231 284 916   Basic Metabolic Panel: Recent Labs  Lab 05/27/21 0106 05/28/21 0211 05/30/21 1334 06/01/21 0203 06/02/21 0826  NA 130* 134* 135 137 140  K 3.4* 3.3* 3.9 3.5 3.7  CL 102 107 109 109 111  CO2 18* 20* 21* 20* 20*  GLUCOSE 97 107* 104* 146* 123*  BUN $Re'12 9 11 18 15  'ubh$ CREATININE 0.74 0.64 0.66 0.58 0.66  CALCIUM 8.4* 8.2* 8.7* 8.9 9.1   GFR: Estimated Creatinine Clearance: 54.1 mL/min (by C-G formula based on SCr of 0.66 mg/dL). Liver Function Tests: No results for input(s): AST, ALT, ALKPHOS, BILITOT, PROT, ALBUMIN in the last 168 hours. No results for input(s): LIPASE, AMYLASE in the last 168 hours. No results for input(s): AMMONIA in the last 168 hours. Coagulation Profile: No results  for input(s): INR, PROTIME in the last 168 hours. Cardiac Enzymes: No results for input(s): CKTOTAL, CKMB, CKMBINDEX, TROPONINI in the last 168 hours. BNP (last 3 results) No results for input(s): PROBNP in the last 8760 hours. HbA1C: No results for input(s): HGBA1C in the last 72 hours. CBG: Recent Labs  Lab 05/31/21 2213  GLUCAP 137*   Lipid Profile: No results for input(s): CHOL, HDL, LDLCALC, TRIG, CHOLHDL, LDLDIRECT in the last 72 hours. Thyroid Function Tests: No results for input(s): TSH, T4TOTAL, FREET4, T3FREE, THYROIDAB in the last 72 hours. Anemia Panel: No results for input(s): VITAMINB12, FOLATE, FERRITIN, TIBC, IRON, RETICCTPCT in the last 72 hours. Sepsis Labs: No results for input(s): PROCALCITON, LATICACIDVEN in the last 168 hours.  Recent Results (from the past 240 hour(s))  Blood culture (routine x 2)     Status: None   Collection Time: 05/24/21 10:51 AM   Specimen: BLOOD  Result Value Ref Range Status   Specimen Description BLOOD RIGHT ANTECUBITAL  Final   Special Requests   Final    BOTTLES DRAWN AEROBIC AND ANAEROBIC Blood Culture adequate volume   Culture   Final    NO GROWTH 5 DAYS Performed at [facility], [address] [place], [place] [address]    Report Status 05/29/2021 FINAL  Final  Resp Panel by RT-PCR (Flu A&B, Covid) Nasopharyngeal Swab     Status: None   Collection Time: 05/24/21 11:14 AM   Specimen: Nasopharyngeal Swab; Nasopharyngeal(NP) swabs in vial transport medium  Result Value Ref Range Status   SARS Coronavirus 2 by RT PCR NEGATIVE NEGATIVE Final    Comment: (NOTE) SARS-CoV-2 target nucleic acids are NOT DETECTED.  The SARS-CoV-2 RNA is generally detectable in upper respiratory specimens during the acute phase of infection. The lowest concentration of SARS-CoV-2 viral copies this assay can detect is 138 copies/mL. A negative result does not preclude SARS-Cov-2 infection and should not be used as the sole basis for  treatment or other patient management decisions. A negative result may occur with  improper specimen collection/handling, submission of specimen other than nasopharyngeal swab, presence of viral mutation(s) within the areas targeted by this assay, and inadequate number of viral copies(<138 copies/mL). A negative result must be combined with clinical observations, patient history, and epidemiological information. The expected result is Negative.  Fact Sheet for Patients:  BloggerCourse.com  Fact Sheet for Healthcare Providers:  SeriousBroker.it  This test is no t yet approved or cleared by the [place] FDA and  has been authorized for detection and/or diagnosis of SARS-CoV-2 by FDA under an Emergency Use Authorization (EUA). This EUA will remain  in effect (meaning this test can be used) for the duration of the COVID-19 declaration under Section 564(b)(1) of the Act, 21 U.S.C.section 360bbb-3(b)(1), unless the authorization is terminated  or revoked sooner.       Influenza A by PCR NEGATIVE NEGATIVE Final   Influenza B by PCR NEGATIVE NEGATIVE Final    Comment: (NOTE) The Xpert Xpress SARS-CoV-2/FLU/RSV plus assay is intended as an aid in the diagnosis of influenza from Nasopharyngeal swab specimens and should not be used as a sole basis for treatment. Nasal washings and aspirates are unacceptable for Xpert Xpress SARS-CoV-2/FLU/RSV testing.  Fact Sheet for Patients: BloggerCourse.com  Fact Sheet for Healthcare Providers: SeriousBroker.it  This test is not yet approved or cleared by the [place] FDA and has been authorized for detection and/or diagnosis of SARS-CoV-2 by FDA under an Emergency Use Authorization (EUA). This EUA will remain in effect (meaning this test can be used) for the duration of the COVID-19 declaration under Section 564(b)(1) of the Act, 21  U.S.C. section 360bbb-3(b)(1), unless the authorization is terminated or revoked.  Performed at [facility], [address] [place], [place] [address]   Blood culture (routine x 2)     Status: None   Collection Time: 05/24/21 12:03 PM   Specimen: BLOOD  Result Value Ref Range Status   Specimen Description BLOOD LEFT ANTECUBITAL  Final   Special Requests   Final    BOTTLES DRAWN AEROBIC AND ANAEROBIC Blood Culture adequate volume   Culture   Final    NO GROWTH 5 DAYS Performed at [facility], [address] [place], [place] [address]    Report Status 05/29/2021 FINAL  Final  Urine culture     Status: None   Collection Time: 05/24/21 12:48 PM   Specimen: Urine, Clean Catch  Result Value Ref Range Status   Specimen Description URINE, CLEAN CATCH  Final   Special Requests NONE  Final   Culture   Final    NO GROWTH Performed at [facility], [address], [place], [place] [address]  Report Status 05/25/2021 FINAL  Final  CSF culture w Gram Stain     Status: None   Collection Time: 05/27/21  7:14 PM   Specimen: CSF; Cerebrospinal Fluid  Result Value Ref Range Status   Specimen Description CSF  Final   Special Requests NONE  Final   Gram Stain   Final    WBC PRESENT, PREDOMINANTLY MONONUCLEAR NO ORGANISMS SEEN CYTOSPIN SMEAR    Culture   Final    NO GROWTH 3 DAYS Performed at [facility], [place] [address] [place], [place] [address]    Report Status 05/31/2021 FINAL  Final  Culture, blood (routine x 2)     Status: None (Preliminary result)   Collection Time: 05/30/21  4:11 PM   Specimen: BLOOD RIGHT HAND  Result Value Ref Range Status   Specimen Description BLOOD RIGHT HAND  Final   Special Requests   Final    BOTTLES DRAWN AEROBIC AND ANAEROBIC Blood Culture results may not be optimal due to an inadequate volume of blood received in culture bottles   Culture   Final    NO GROWTH 3 DAYS Performed at [facility], [place] [address], [place], [place] [address]    Report Status PENDING  Incomplete  Culture, blood (routine x 2)     Status: None (Preliminary result)   Collection Time: 05/30/21  4:11 PM   Specimen: BLOOD RIGHT HAND  Result Value Ref Range Status   Specimen Description BLOOD RIGHT HAND  Final   Special Requests   Final    BOTTLES DRAWN AEROBIC ONLY Blood Culture results may not be optimal due to an inadequate volume of blood received in culture bottles   Culture   Final    NO GROWTH 3 DAYS Performed at [facility], [place] [address], [place], [place] [address]    Report Status PENDING  Incomplete         Radiology Studies: IR US Guide Vasc Access Right  Result Date: 06/01/2021 INDICATION: 70 year old female with past medical history significant for hypertension presenting with left-sided weakness and fever. 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 Given the possibility of CNS vasculitis, a diagnostic cerebral angiogram was requested. EXAM: ULTRASOUND-GUIDED VASCULAR ACCESS DIAGNOSTIC CEREBRAL ANGIOGRAM COMPARISON:  CT/CT angiogram of the head and neck May 26, 2021 MEDICATIONS: 5,000 IU heparin, 5 mg Verapamil and 200 mcg nitroglycerin ANESTHESIA/SEDATION: Versed 1 mg IV; Fentanyl 25 mcg IV Moderate Sedation Time:  46 minutes The patient was continuously monitored during the procedure by the interventional radiology nurse under my direct supervision. CONTRAST:  78 mL of Omnipaque 300 milligrams/mL FLUOROSCOPY TIME:  Fluoroscopy Time: 11 minutes 6 seconds (331 mGy). COMPLICATIONS: None immediate. TECHNIQUE: Informed written consent was obtained from the patient after a thorough discussion of the procedural risks, benefits and alternatives. All questions were addressed. Maximal Sterile Barrier Technique was utilized including caps, mask, sterile gowns, sterile gloves, sterile drape, hand hygiene and skin antiseptic. A timeout  was performed prior to the initiation of the procedure. Using the modified Seldinger technique and a micropuncture kit, access was gained to the distal right radial artery at the anatomical snuffbox and a 5 French sheath was placed. Slow intra arterial infusion of 5,000 IU heparin, 5 mg Verapamil and 200 mcg nitroglycerin diluted in patient's own blood was performed. No significant fluctuation in patient's blood pressure seen. Then, a right radial artery roadmap  was obtained via sheath side port. Normal brachial artery branching pattern seen. No significant anatomical variation. The right radial artery caliber is adequate for vascular access. Next, a 5 [place] Simmons 2 glide catheter was navigated over a 0.035" Terumo Glidewire into the right subclavian artery under fluoroscopic guidance. Under fluoroscopy the catheter was placed into the right vertebral artery. Frontal, lateral and magnified bilateral oblique views of the head were obtained. The catheter was then placed into the right common carotid artery. Frontal and lateral views of the neck were obtained. Under biplane roadmap, the catheter was placed into the right internal carotid artery. Frontal, lateral and magnified oblique views of the head were obtained. The catheter was subsequently placed into the right external carotid artery. Frontal and lateral angiograms of the head were obtained. Next, the catheter was placed into the left common carotid artery. Frontal and lateral views of the neck were obtained followed by frontal, lateral and magnified oblique views of the head. The catheter was subsequently withdrawn. An inflatable band was placed and inflated over the right hand access site. The vascular sheath was withdrawn and the band was slowly deflated until brisk flow was noted through the arteriotomy site. At this point, the band was reinflated with additional 2 cc of air to obtain patent hemostasis. FINDINGS: Right radial artery ultrasound and right  radial artery angiogram: The caliber of the distal right radial artery is appropriate for angiogram access. The right radial artery and the right ulnar artery have normal course and caliber. No significant anatomical variants noted. Right vertebral artery angiograms: The right vertebral artery, basilar artery, and bilateral posterior cerebral arteries are unremarkable. The left vertebral artery seen by contrast reflux and also appear unremarkable. Luminal caliber is smooth and tapering. No aneurysms or abnormally high-flow, early draining veins are seen. No regions of abnormal hypervascularity are noted. The visualized dural sinuses are patent. Right CCA angiograms: Cervical angiograms show normal course and caliber of the visualized right common carotid and internal carotid arteries. There are no significant stenoses. Right ICA angiograms: There is brisk vascular contrast filling of the ACA and MCA vascular trees. Luminal caliber is smooth and tapering. No aneurysms or abnormally high-flow, early draining veins are seen. No regions of abnormal hypervascularity are noted. The visualized dural sinuses are patent. Right ECA and right occipital angiograms: No early venous drainage was noted. The visualized branches of the right external carotid artery are unremarkable. Left CCA angiograms: Cervical angiograms show normal caliber of the visualized left common carotid and internal carotid arteries. Increased tortuosity of the mid cervical segment of the left ICA with mild luminal irregularity. There are no significant stenoses. Left CCA angiograms - cranial views: There is brisk vascular contrast filling of the ACA and MCA vascular trees. Luminal caliber is smooth and tapering. No aneurysms or abnormally high-flow, early draining veins are seen. No regions of abnormal hypervascularity are noted. The visualized dural sinuses are patent. The visualized branches of the left external carotid artery are unremarkable.  PROCEDURE: No intervention performed. IMPRESSION: 1. No angiographic evidence of vasculitis. 2. No significant intracranial stenosis, vasospasm, vascular occlusion, aneurysm, AVM, dural AV fistula or other significant vascular abnormality. PLAN: Results communicated to the neurology team. Electronically Signed   By: Pedro Earls M.D.   On: 06/01/2021 17:01   VAS [place] UPPER EXTREMITY ARTERIAL DUPLEX  Result Date: 06/01/2021  UPPER EXTREMITY DUPLEX STUDY Patient Name:  KELLIANNE EK [facility]  Date of Exam:   06/01/2021 Medical Rec #: [id number]  Accession #:    [id number] Date of Birth: 1951-06-03          Patient Gender: F Patient Age:   69Y Exam Location:  [facility] Procedure:      VAS [place] UPPER EXTREMITY ARTERIAL DUPLEX Referring Phys: [id number] [place] --------------------------------------------------------------------------------  Indications: Bruising and Pain. History:     Patient has a history of catheterization via left radial artery.  Risk Factors: Hypertension. Limitations: TR band in place on the right wrist. Comparison Study: No prior studies. Performing Technologist: Oliver Hum RVT  Examination Guidelines: A complete evaluation includes B-mode imaging, spectral Doppler, color Doppler, and power Doppler as needed of all accessible portions of each vessel. Bilateral testing is considered an integral part of a complete examination. Limited examinations for reoccurring indications may be performed as noted.  Right Doppler Findings: +---------------+----------+---------+--------+--------+ Site           PSV (cm/s)Waveform StenosisComments +---------------+----------+---------+--------+--------+ Subclavian Dist          triphasic                 +---------------+----------+---------+--------+--------+ Brachial Dist            triphasic                 +---------------+----------+---------+--------+--------+ Radial Dist              triphasic                  +---------------+----------+---------+--------+--------+ Ulnar Dist               triphasic                 +---------------+----------+---------+--------+--------+ Palmar Arch              triphasic                 +---------------+----------+---------+--------+--------+ There is no evidence of a pseudoaneurysm. There is evidence of a channel leading off of the distal radial artery. The channel is located beneath the TR band.     Preliminary    IR ANGIO INTRA EXTRACRAN SEL COM CAROTID INNOMINATE UNI L MOD SED  Result Date: 06/01/2021 INDICATION: 70 year old female with past medical history significant for hypertension presenting with left-sided weakness and fever. 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 Given the possibility of CNS vasculitis, a diagnostic cerebral angiogram was requested. EXAM: ULTRASOUND-GUIDED VASCULAR ACCESS DIAGNOSTIC CEREBRAL ANGIOGRAM COMPARISON:  CT/CT angiogram of the head and neck May 26, 2021 MEDICATIONS: 5,000 IU heparin, 5 mg Verapamil and 200 mcg nitroglycerin ANESTHESIA/SEDATION: Versed 1 mg IV; Fentanyl 25 mcg IV Moderate Sedation Time:  46 minutes The patient was continuously monitored during the procedure by the interventional radiology nurse under my direct supervision. CONTRAST:  78 mL of Omnipaque 300 milligrams/mL FLUOROSCOPY TIME:  Fluoroscopy Time: 11 minutes 6 seconds (331 mGy). COMPLICATIONS: None immediate. TECHNIQUE: Informed written consent was obtained from the patient after a thorough discussion of the procedural risks, benefits and alternatives. All questions were addressed. Maximal Sterile Barrier Technique was utilized including caps, mask, sterile gowns, sterile gloves, sterile drape, hand hygiene and skin antiseptic. A timeout was performed prior to the initiation of the procedure. Using the modified Seldinger technique and a micropuncture kit, access was  gained to the distal right radial artery at the anatomical snuffbox and a 5 French sheath was placed. Slow intra arterial infusion of 5,000 IU heparin, 5  mg Verapamil and 200 mcg nitroglycerin diluted in patient's own blood was performed. No significant fluctuation in patient's blood pressure seen. Then, a right radial artery roadmap was obtained via sheath side port. Normal brachial artery branching pattern seen. No significant anatomical variation. The right radial artery caliber is adequate for vascular access. Next, a 5 [place] Simmons 2 glide catheter was navigated over a 0.035" Terumo Glidewire into the right subclavian artery under fluoroscopic guidance. Under fluoroscopy the catheter was placed into the right vertebral artery. Frontal, lateral and magnified bilateral oblique views of the head were obtained. The catheter was then placed into the right common carotid artery. Frontal and lateral views of the neck were obtained. Under biplane roadmap, the catheter was placed into the right internal carotid artery. Frontal, lateral and magnified oblique views of the head were obtained. The catheter was subsequently placed into the right external carotid artery. Frontal and lateral angiograms of the head were obtained. Next, the catheter was placed into the left common carotid artery. Frontal and lateral views of the neck were obtained followed by frontal, lateral and magnified oblique views of the head. The catheter was subsequently withdrawn. An inflatable band was placed and inflated over the right hand access site. The vascular sheath was withdrawn and the band was slowly deflated until brisk flow was noted through the arteriotomy site. At this point, the band was reinflated with additional 2 cc of air to obtain patent hemostasis. FINDINGS: Right radial artery ultrasound and right radial artery angiogram: The caliber of the distal right radial artery is appropriate for angiogram access. The right radial artery  and the right ulnar artery have normal course and caliber. No significant anatomical variants noted. Right vertebral artery angiograms: The right vertebral artery, basilar artery, and bilateral posterior cerebral arteries are unremarkable. The left vertebral artery seen by contrast reflux and also appear unremarkable. Luminal caliber is smooth and tapering. No aneurysms or abnormally high-flow, early draining veins are seen. No regions of abnormal hypervascularity are noted. The visualized dural sinuses are patent. Right CCA angiograms: Cervical angiograms show normal course and caliber of the visualized right common carotid and internal carotid arteries. There are no significant stenoses. Right ICA angiograms: There is brisk vascular contrast filling of the ACA and MCA vascular trees. Luminal caliber is smooth and tapering. No aneurysms or abnormally high-flow, early draining veins are seen. No regions of abnormal hypervascularity are noted. The visualized dural sinuses are patent. Right ECA and right occipital angiograms: No early venous drainage was noted. The visualized branches of the right external carotid artery are unremarkable. Left CCA angiograms: Cervical angiograms show normal caliber of the visualized left common carotid and internal carotid arteries. Increased tortuosity of the mid cervical segment of the left ICA with mild luminal irregularity. There are no significant stenoses. Left CCA angiograms - cranial views: There is brisk vascular contrast filling of the ACA and MCA vascular trees. Luminal caliber is smooth and tapering. No aneurysms or abnormally high-flow, early draining veins are seen. No regions of abnormal hypervascularity are noted. The visualized dural sinuses are patent. The visualized branches of the left external carotid artery are unremarkable. PROCEDURE: No intervention performed. IMPRESSION: 1. No angiographic evidence of vasculitis. 2. No significant intracranial stenosis,  vasospasm, vascular occlusion, aneurysm, AVM, dural AV fistula or other significant vascular abnormality. PLAN: Results communicated to the neurology team. Electronically Signed   By: Pedro Earls M.D.   On: 06/01/2021 17:01   IR ANGIO INTRA EXTRACRAN SEL  INTERNAL CAROTID UNI R MOD SED  Result Date: 06/01/2021 INDICATION: 70 year old female with past medical history significant for hypertension presenting with left-sided weakness and fever. 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 Given the possibility of CNS vasculitis, a diagnostic cerebral angiogram was requested. EXAM: ULTRASOUND-GUIDED VASCULAR ACCESS DIAGNOSTIC CEREBRAL ANGIOGRAM COMPARISON:  CT/CT angiogram of the head and neck May 26, 2021 MEDICATIONS: 5,000 IU heparin, 5 mg Verapamil and 200 mcg nitroglycerin ANESTHESIA/SEDATION: Versed 1 mg IV; Fentanyl 25 mcg IV Moderate Sedation Time:  46 minutes The patient was continuously monitored during the procedure by the interventional radiology nurse under my direct supervision. CONTRAST:  78 mL of Omnipaque 300 milligrams/mL FLUOROSCOPY TIME:  Fluoroscopy Time: 11 minutes 6 seconds (331 mGy). COMPLICATIONS: None immediate. TECHNIQUE: Informed written consent was obtained from the patient after a thorough discussion of the procedural risks, benefits and alternatives. All questions were addressed. Maximal Sterile Barrier Technique was utilized including caps, mask, sterile gowns, sterile gloves, sterile drape, hand hygiene and skin antiseptic. A timeout was performed prior to the initiation of the procedure. Using the modified Seldinger technique and a micropuncture kit, access was gained to the distal right radial artery at the anatomical snuffbox and a 5 French sheath was placed. Slow intra arterial infusion of 5,000 IU heparin, 5 mg Verapamil and 200 mcg nitroglycerin diluted in patient's own blood was  performed. No significant fluctuation in patient's blood pressure seen. Then, a right radial artery roadmap was obtained via sheath side port. Normal brachial artery branching pattern seen. No significant anatomical variation. The right radial artery caliber is adequate for vascular access. Next, a 5 [place] Simmons 2 glide catheter was navigated over a 0.035" Terumo Glidewire into the right subclavian artery under fluoroscopic guidance. Under fluoroscopy the catheter was placed into the right vertebral artery. Frontal, lateral and magnified bilateral oblique views of the head were obtained. The catheter was then placed into the right common carotid artery. Frontal and lateral views of the neck were obtained. Under biplane roadmap, the catheter was placed into the right internal carotid artery. Frontal, lateral and magnified oblique views of the head were obtained. The catheter was subsequently placed into the right external carotid artery. Frontal and lateral angiograms of the head were obtained. Next, the catheter was placed into the left common carotid artery. Frontal and lateral views of the neck were obtained followed by frontal, lateral and magnified oblique views of the head. The catheter was subsequently withdrawn. An inflatable band was placed and inflated over the right hand access site. The vascular sheath was withdrawn and the band was slowly deflated until brisk flow was noted through the arteriotomy site. At this point, the band was reinflated with additional 2 cc of air to obtain patent hemostasis. FINDINGS: Right radial artery ultrasound and right radial artery angiogram: The caliber of the distal right radial artery is appropriate for angiogram access. The right radial artery and the right ulnar artery have normal course and caliber. No significant anatomical variants noted. Right vertebral artery angiograms: The right vertebral artery, basilar artery, and bilateral posterior cerebral arteries are  unremarkable. The left vertebral artery seen by contrast reflux and also appear unremarkable. Luminal caliber is smooth and tapering. No aneurysms or abnormally high-flow, early draining veins are seen. No regions of abnormal hypervascularity are noted. The visualized dural sinuses are patent. Right CCA angiograms: Cervical angiograms show normal course and caliber of the visualized right  common carotid and internal carotid arteries. There are no significant stenoses. Right ICA angiograms: There is brisk vascular contrast filling of the ACA and MCA vascular trees. Luminal caliber is smooth and tapering. No aneurysms or abnormally high-flow, early draining veins are seen. No regions of abnormal hypervascularity are noted. The visualized dural sinuses are patent. Right ECA and right occipital angiograms: No early venous drainage was noted. The visualized branches of the right external carotid artery are unremarkable. Left CCA angiograms: Cervical angiograms show normal caliber of the visualized left common carotid and internal carotid arteries. Increased tortuosity of the mid cervical segment of the left ICA with mild luminal irregularity. There are no significant stenoses. Left CCA angiograms - cranial views: There is brisk vascular contrast filling of the ACA and MCA vascular trees. Luminal caliber is smooth and tapering. No aneurysms or abnormally high-flow, early draining veins are seen. No regions of abnormal hypervascularity are noted. The visualized dural sinuses are patent. The visualized branches of the left external carotid artery are unremarkable. PROCEDURE: No intervention performed. IMPRESSION: 1. No angiographic evidence of vasculitis. 2. No significant intracranial stenosis, vasospasm, vascular occlusion, aneurysm, AVM, dural AV fistula or other significant vascular abnormality. PLAN: Results communicated to the neurology team. Electronically Signed   By: Pedro Earls M.D.   On:  06/01/2021 17:01   IR ANGIO VERTEBRAL SEL VERTEBRAL UNI R MOD SED  Result Date: 06/01/2021 INDICATION: 70 year old female with past medical history significant for hypertension presenting with left-sided weakness and fever. 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 Given the possibility of CNS vasculitis, a diagnostic cerebral angiogram was requested. EXAM: ULTRASOUND-GUIDED VASCULAR ACCESS DIAGNOSTIC CEREBRAL ANGIOGRAM COMPARISON:  CT/CT angiogram of the head and neck May 26, 2021 MEDICATIONS: 5,000 IU heparin, 5 mg Verapamil and 200 mcg nitroglycerin ANESTHESIA/SEDATION: Versed 1 mg IV; Fentanyl 25 mcg IV Moderate Sedation Time:  46 minutes The patient was continuously monitored during the procedure by the interventional radiology nurse under my direct supervision. CONTRAST:  78 mL of Omnipaque 300 milligrams/mL FLUOROSCOPY TIME:  Fluoroscopy Time: 11 minutes 6 seconds (331 mGy). COMPLICATIONS: None immediate. TECHNIQUE: Informed written consent was obtained from the patient after a thorough discussion of the procedural risks, benefits and alternatives. All questions were addressed. Maximal Sterile Barrier Technique was utilized including caps, mask, sterile gowns, sterile gloves, sterile drape, hand hygiene and skin antiseptic. A timeout was performed prior to the initiation of the procedure. Using the modified Seldinger technique and a micropuncture kit, access was gained to the distal right radial artery at the anatomical snuffbox and a 5 French sheath was placed. Slow intra arterial infusion of 5,000 IU heparin, 5 mg Verapamil and 200 mcg nitroglycerin diluted in patient's own blood was performed. No significant fluctuation in patient's blood pressure seen. Then, a right radial artery roadmap was obtained via sheath side port. Normal brachial artery branching pattern seen. No significant anatomical variation. The right  radial artery caliber is adequate for vascular access. Next, a 5 [place] Simmons 2 glide catheter was navigated over a 0.035" Terumo Glidewire into the right subclavian artery under fluoroscopic guidance. Under fluoroscopy the catheter was placed into the right vertebral artery. Frontal, lateral and magnified bilateral oblique views of the head were obtained. The catheter was then placed into the right common carotid artery. Frontal and lateral views of the neck were obtained. Under biplane roadmap, the catheter was placed into the right internal  carotid artery. Frontal, lateral and magnified oblique views of the head were obtained. The catheter was subsequently placed into the right external carotid artery. Frontal and lateral angiograms of the head were obtained. Next, the catheter was placed into the left common carotid artery. Frontal and lateral views of the neck were obtained followed by frontal, lateral and magnified oblique views of the head. The catheter was subsequently withdrawn. An inflatable band was placed and inflated over the right hand access site. The vascular sheath was withdrawn and the band was slowly deflated until brisk flow was noted through the arteriotomy site. At this point, the band was reinflated with additional 2 cc of air to obtain patent hemostasis. FINDINGS: Right radial artery ultrasound and right radial artery angiogram: The caliber of the distal right radial artery is appropriate for angiogram access. The right radial artery and the right ulnar artery have normal course and caliber. No significant anatomical variants noted. Right vertebral artery angiograms: The right vertebral artery, basilar artery, and bilateral posterior cerebral arteries are unremarkable. The left vertebral artery seen by contrast reflux and also appear unremarkable. Luminal caliber is smooth and tapering. No aneurysms or abnormally high-flow, early draining veins are seen. No regions of abnormal  hypervascularity are noted. The visualized dural sinuses are patent. Right CCA angiograms: Cervical angiograms show normal course and caliber of the visualized right common carotid and internal carotid arteries. There are no significant stenoses. Right ICA angiograms: There is brisk vascular contrast filling of the ACA and MCA vascular trees. Luminal caliber is smooth and tapering. No aneurysms or abnormally high-flow, early draining veins are seen. No regions of abnormal hypervascularity are noted. The visualized dural sinuses are patent. Right ECA and right occipital angiograms: No early venous drainage was noted. The visualized branches of the right external carotid artery are unremarkable. Left CCA angiograms: Cervical angiograms show normal caliber of the visualized left common carotid and internal carotid arteries. Increased tortuosity of the mid cervical segment of the left ICA with mild luminal irregularity. There are no significant stenoses. Left CCA angiograms - cranial views: There is brisk vascular contrast filling of the ACA and MCA vascular trees. Luminal caliber is smooth and tapering. No aneurysms or abnormally high-flow, early draining veins are seen. No regions of abnormal hypervascularity are noted. The visualized dural sinuses are patent. The visualized branches of the left external carotid artery are unremarkable. PROCEDURE: No intervention performed. IMPRESSION: 1. No angiographic evidence of vasculitis. 2. No significant intracranial stenosis, vasospasm, vascular occlusion, aneurysm, AVM, dural AV fistula or other significant vascular abnormality. PLAN: Results communicated to the neurology team. Electronically Signed   By: Pedro Earls M.D.   On: 06/01/2021 17:01   IR ANGIO EXTERNAL CAROTID SEL EXT CAROTID UNI R MOD SED  Result Date: 06/01/2021 INDICATION: 70 year old female with past medical history significant for hypertension presenting with left-sided weakness and  fever. 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 Given the possibility of CNS vasculitis, a diagnostic cerebral angiogram was requested. EXAM: ULTRASOUND-GUIDED VASCULAR ACCESS DIAGNOSTIC CEREBRAL ANGIOGRAM COMPARISON:  CT/CT angiogram of the head and neck May 26, 2021 MEDICATIONS: 5,000 IU heparin, 5 mg Verapamil and 200 mcg nitroglycerin ANESTHESIA/SEDATION: Versed 1 mg IV; Fentanyl 25 mcg IV Moderate Sedation Time:  46 minutes The patient was continuously monitored during the procedure by the interventional radiology nurse under my direct supervision. CONTRAST:  78 mL of Omnipaque 300 milligrams/mL FLUOROSCOPY TIME:  Fluoroscopy Time: 11 minutes 6 seconds (331 mGy). COMPLICATIONS: None immediate. TECHNIQUE: Informed written consent was obtained from the patient after a thorough discussion of the procedural risks, benefits and alternatives. All questions were addressed. Maximal Sterile Barrier Technique was utilized including caps, mask, sterile gowns, sterile gloves, sterile drape, hand hygiene and skin antiseptic. A timeout was performed prior to the initiation of the procedure. Using the modified Seldinger technique and a micropuncture kit, access was gained to the distal right radial artery at the anatomical snuffbox and a 5 French sheath was placed. Slow intra arterial infusion of 5,000 IU heparin, 5 mg Verapamil and 200 mcg nitroglycerin diluted in patient's own blood was performed. No significant fluctuation in patient's blood pressure seen. Then, a right radial artery roadmap was obtained via sheath side port. Normal brachial artery branching pattern seen. No significant anatomical variation. The right radial artery caliber is adequate for vascular access. Next, a 5 [place] Simmons 2 glide catheter was navigated over a 0.035" Terumo Glidewire into the right subclavian artery under fluoroscopic guidance. Under fluoroscopy the  catheter was placed into the right vertebral artery. Frontal, lateral and magnified bilateral oblique views of the head were obtained. The catheter was then placed into the right common carotid artery. Frontal and lateral views of the neck were obtained. Under biplane roadmap, the catheter was placed into the right internal carotid artery. Frontal, lateral and magnified oblique views of the head were obtained. The catheter was subsequently placed into the right external carotid artery. Frontal and lateral angiograms of the head were obtained. Next, the catheter was placed into the left common carotid artery. Frontal and lateral views of the neck were obtained followed by frontal, lateral and magnified oblique views of the head. The catheter was subsequently withdrawn. An inflatable band was placed and inflated over the right hand access site. The vascular sheath was withdrawn and the band was slowly deflated until brisk flow was noted through the arteriotomy site. At this point, the band was reinflated with additional 2 cc of air to obtain patent hemostasis. FINDINGS: Right radial artery ultrasound and right radial artery angiogram: The caliber of the distal right radial artery is appropriate for angiogram access. The right radial artery and the right ulnar artery have normal course and caliber. No significant anatomical variants noted. Right vertebral artery angiograms: The right vertebral artery, basilar artery, and bilateral posterior cerebral arteries are unremarkable. The left vertebral artery seen by contrast reflux and also appear unremarkable. Luminal caliber is smooth and tapering. No aneurysms or abnormally high-flow, early draining veins are seen. No regions of abnormal hypervascularity are noted. The visualized dural sinuses are patent. Right CCA angiograms: Cervical angiograms show normal course and caliber of the visualized right common carotid and internal carotid arteries. There are no significant  stenoses. Right ICA angiograms: There is brisk vascular contrast filling of the ACA and MCA vascular trees. Luminal caliber is smooth and tapering. No aneurysms or abnormally high-flow, early draining veins are seen. No regions of abnormal hypervascularity are noted. The visualized dural sinuses are patent. Right ECA and right occipital angiograms: No early venous drainage was noted. The visualized branches of the right external carotid artery are unremarkable. Left CCA angiograms: Cervical angiograms show normal caliber of the visualized left common carotid and internal carotid arteries. Increased tortuosity of the mid cervical segment of the left ICA with mild luminal irregularity. There are no significant stenoses. Left CCA angiograms - cranial views: There is brisk vascular contrast filling of the  ACA and MCA vascular trees. Luminal caliber is smooth and tapering. No aneurysms or abnormally high-flow, early draining veins are seen. No regions of abnormal hypervascularity are noted. The visualized dural sinuses are patent. The visualized branches of the left external carotid artery are unremarkable. PROCEDURE: No intervention performed. IMPRESSION: 1. No angiographic evidence of vasculitis. 2. No significant intracranial stenosis, vasospasm, vascular occlusion, aneurysm, AVM, dural AV fistula or other significant vascular abnormality. PLAN: Results communicated to the neurology team. Electronically Signed   By: Pedro Earls M.D.   On: 06/01/2021 17:01        Scheduled Meds:  amLODipine  5 mg Oral q AM   aspirin EC  81 mg Oral Daily   buPROPion  300 mg Oral Daily   calcium carbonate  1 tablet Oral Q breakfast   cholecalciferol  1,000 Units Oral Daily   clopidogrel  75 mg Oral Daily   enoxaparin (LOVENOX) injection  40 mg Subcutaneous Q24H   feeding supplement  237 mL Oral BID BM   hydrALAZINE  25 mg Oral BID   metoprolol succinate  100 mg Oral Daily   multivitamin with minerals  1  tablet Oral Daily   pantoprazole  40 mg Oral Daily   topiramate  150 mg Oral QHS   venlafaxine XR  75 mg Oral Q breakfast   vitamin B-12  1,000 mcg Oral Daily   Continuous Infusions:   LOS: 8 days    Time spent: 39 minutes spent on chart review, discussion with nursing staff, consultants, updating family and interview/physical exam; more than 50% of that time was spent in counseling and/or coordination of care.    Raynisha Avilla J [place] DO [facility] Available via Epic secure chat 7am-7pm After these hours, please refer to coverage provider listed on [email or web address] 06/02/2021, 1:38 PM

## 2021-06-02 NOTE — H&P (View-Only) (Signed)
PROGRESS NOTE    Michaela Rios  TIR:[id number] DOB: [birth year] DOA: 05/24/2021 PCP: Binnie Rail, MD    Brief Narrative:  Michaela Rios is a 70 year old female with past medical history significant for essential hypertension, prediabetes, depression, CKD stage III, history of peptic ulcer disease, chronic low back pain, migraine headache who presented to [facility] on 6/27 with generalized weakness, loss of balance.  Patient reports she was going to the bathroom lost her balance and fell.  She has been having some chills.  Patient also reports poor oral intake over the last few days with increased urinary frequency but without dysuria.  No nausea/vomiting/diarrhea, no chest pain, no palpitations, no abdominal pain.  In the ED, BP 164/116, HR 141, RR 17, temperature 101.4 F, SPO2 96% on room air.  Sodium 139, potassium 2.8, chloride 105, CO2 22, glucose 121, BUN 9, creatinine 0.77.  AST 24, ALT 16, total bilirubin 0.5.  WBC 8.6, hemoglobin 11.6, platelets 243.  Lactic acid 2.1.  Urinalysis negative.  COVID-19 PCR negative.  Influenza A/B PCR negative.  D-dimer elevated 2.51.  Chest x-ray with no acute cardiopulmonary disease process.  CT renal stone study with no acute obstructing urinary tract or ureteral calculus, no uropathy or hydronephrosis, no acute intra-abdominal or pelvic finding.  CT angiogram chest with no evidence of acute pulmonary embolism, 4 mm right middle lobe peripheral nodular opacity.   Assessment & Plan:   Principal Problem:   Sepsis ([place]) Active Problems:   Anxiety   Depression   Essential hypertension   Prediabetes   Hypokalemia   Tachycardia   Cerebral thrombosis with cerebral infarction   SIRS Fever of unknown origin Patient presenting to the ED with weakness, fever with temperature 101.4 F, tachycardia, tachypnea and mild elevation of lactic acid with associated confusion.  Chest x-ray negative for acute cardiopulmonary disease process.  Urinalysis negative  for infection.  COVID/influenza PCR negative.  Blood cultures and urine culture with no growth.  CT abdomen/pelvis with no acute intra-abdominal/pelvic process.  Underwent lumbar puncture on 6/30.  Patient was initially started on the broad-spectrum antibiotics with gentamicin, vancomycin, Flagyl, aztreonam.  Infectious disease was initially consulted, antibiotics were discontinued with negative infectious work-up.  ESR 37, CRP 6.8.  RPR negative, VR DL nonreactive.  ANA positive, double-stranded DNA negative.  ACE level within normal limits. Rheumatoid factor negative.  B12 254, B1 level normal, TSH within normal limits.  Acute hepatitis panel negative.  Complement levels within normal limits. Cerebral angiogram 7/5; no angiographic evidence of vasculitis, no intracranial stenosis/vasospasm, vascular occlusion, aneurysm, AVM, dural AV fistula or other significant vascular abnormality.  CT head with new multifocal hypoattenuation basal ganglia and thalamus bilaterally of indeterminate etiology, no mass-effect to suggest tumor.  MR brain with right basal ganglia infarct, hyperdensity and mild enlargement of the left head of the caudate, left thalamus and small amount of hyperintensity in the right medial thalamus with recommendation of postcontrast MRI for further evaluation.  MRI with contrast with mild largely vascular perivascular appearing enhancement in the areas of the signal abnormality in the deep gray nuclei as well as small amount of leptomeningeal enhancement over the right and possibly left parietal convexities, no solid mass enhancing concerning for cryptococcus, vasculitis, neoplasm, angiocentric lymphoma versus granulomatosis, autoimmune or inflammatory condition. --Neurology following, appreciate assistance --Completed 3-day course of Solu-Medrol 1 g IV q24h on 7/5 --Remains afebrile --CSF cytology: Pending --CSF oligoclonal bands: Pending --ANCA: pending --CSF paraneoplastic panel:  Pending --TEE: pending to rule out  marantic endocarditis and PFO; planned for 06/03/21 --Neurology repeating MRI brain with and without contrast today  Acute right basal ganglia infarct Patient presenting to the ED with progressive weakness, gait disturbance.  Initially thought to be infectious, chest x-ray, CT angiogram chest, and urinalysis unrevealing.  Brain with right basal ganglia infarct and hyperintensity and mild enlargement of the left head of the caudate, left thalamus and small amount of hyperintensity right medial thalamus.  Hemoglobin A1c 5.9.  LDL 45.  TTE with LVEF 60 to 65%, no LV regional wall motion normalities, grade 1 diastolic dysfunction, mild MR, no aortic valve stenosis, IVC normal in size. --Aspirin 81 mg p.o. daily --Plavix 75 mg p.o. daily --No statin as LDL below goal <70 (45) --Continue PT/OT efforts while inpatient. --Pending insurance authorization for CIR  Right hand hematoma Patient underwent cerebral angiogram via right radial artery catheterization on 7/5 with resultant right dorsal hand hematoma.  Left upper extremity arterial duplex negative for pseudoaneurysm.  Seen by vascular surgery with no focal hematoma to drain as this is diffuse interstitial and intramuscular edema. --PT/OT efforts  Right middle lobe nodule Incidental finding of a 4 mm right middle lobe peripheral nodular opacity.  Recommend repeat CT chest 12 months.  Elevated D-dimer D-dimer elevated 2.51 admission, bilateral duplex ultrasound lower extremities negative for DVT, CT angiogram chest negative for PE.  Essential hypertension BP 144/89 this morning. --Amlodipine 5 mg p.o. daily --Hydralazine 25 mg p.o. twice daily --Metoprolol succinate 100 mg p.o. daily  Depression:  --Venlafaxine 75 mg p.o. daily --Wellbutrin 300 mg p.o. daily  Anxiety: Lorazepam 1 mg twice daily as needed  Migraine headaches: Topamax 150 mg p.o. nightly, sumatriptan as needed   DVT prophylaxis:  enoxaparin (LOVENOX) injection 40 mg Start: 06/02/21 1000   Code Status: Full Code Family Communication: None present at bedside this morning.  Disposition Plan:  Level of care: Telemetry Medical Status is: Inpatient  Remains inpatient appropriate because:Ongoing diagnostic testing needed not appropriate for outpatient work up, Unsafe d/c plan, IV treatments appropriate due to intensity of illness or inability to take PO, and Inpatient level of care appropriate due to severity of illness  Dispo: The patient is from: Home              Anticipated d/c is to: CIR              Patient currently is not medically stable to d/c.   Difficult to place patient No    Consultants:  Infectious disease Neurology Vascular surgery Interventional radiology Cardiology  Procedures:  Lumbar puncture 6/30 Cerebral angiogram 7/5; no angiographic evidence of vasculitis, no intracranial stenosis/vasospasm, vascular occlusion, aneurysm, AVM, dural AV fistula or other significant vascular abnormality  Antimicrobials:  Gentamicin 6/27 - 6/27 Flagyl 6/27 - 6/29 Vancomycin 6/27 - 6/29 Aztreonam 6/27 - 7/1  Subjective: Patient seen examined bedside, resting comfortably.  Continues to complain of hand pain from recent angiogram.  Pending TEE for tomorrow and repeat MR brain per neurology today.  Also awaiting insurance authorization for CIR.  No other questions or concerns at this time.  No family present at bedside this morning.  Patient denies headache, no chest pain, no palpitations, no shortness of breath, no abdominal pain.  No acute events overnight per nursing staff.  Objective: Vitals:   06/01/21 1635 06/01/21 2100 06/02/21 0500 06/02/21 0652  BP: (!) 145/93 (!) 162/88 (!) 144/89 126/75  Pulse: (!) 103 83 76   Resp:  18 18   Temp: 97.9  F (36.6 C) 98.3 F (36.8 C) 98 F (36.7 C)   TempSrc: Axillary Oral Oral   SpO2: (!) 89% 99% 95%   Weight:      Height:        Intake/Output Summary  (Last 24 hours) at 06/02/2021 1338 Last data filed at 06/02/2021 0557 Gross per 24 hour  Intake 237 ml  Output 2000 ml  Net -1763 ml   Filed Weights   05/24/21 0956  Weight: 60.8 kg    Examination:  General exam: Appears calm and comfortable  Respiratory system: Clear to auscultation. Respiratory effort normal.  On room air Cardiovascular system: S1 & S2 heard, RRR. No JVD, murmurs, rubs, gallops or clicks. No pedal edema. Gastrointestinal system: Abdomen is nondistended, soft and nontender. No organomegaly or masses felt. Normal bowel sounds heard. Central nervous system: Alert and oriented. No focal neurological deficits.  Sensation to light touch intact. Extremities: Moves all extremities independently, muscle strength 5/5 RLE, right hand limited by pain/hematoma, LUE 4/5 and LLE 4+/5.  Skin: No rashes, lesions or ulcers Psychiatry: Judgement and insight appear normal. Mood & affect appropriate.     Data Reviewed: I have personally reviewed following labs and imaging studies  CBC: Recent Labs  Lab 05/27/21 0106 05/28/21 0211 05/30/21 1334 06/02/21 0826  WBC 7.2 6.6 6.1 10.0  HGB 10.7* 10.3* 10.4* 11.5*  HCT 32.2* 30.6* 31.6* 34.8*  MCV 88.7 89.0 89.5 89.5  PLT 249 231 284 371   Basic Metabolic Panel: Recent Labs  Lab 05/27/21 0106 05/28/21 0211 05/30/21 1334 06/01/21 0203 06/02/21 0826  NA 130* 134* 135 137 140  K 3.4* 3.3* 3.9 3.5 3.7  CL 102 107 109 109 111  CO2 18* 20* 21* 20* 20*  GLUCOSE 97 107* 104* 146* 123*  BUN $Re'12 9 11 18 15  'DOv$ CREATININE 0.74 0.64 0.66 0.58 0.66  CALCIUM 8.4* 8.2* 8.7* 8.9 9.1   GFR: Estimated Creatinine Clearance: 54.1 mL/min (by C-G formula based on SCr of 0.66 mg/dL). Liver Function Tests: No results for input(s): AST, ALT, ALKPHOS, BILITOT, PROT, ALBUMIN in the last 168 hours. No results for input(s): LIPASE, AMYLASE in the last 168 hours. No results for input(s): AMMONIA in the last 168 hours. Coagulation Profile: No results  for input(s): INR, PROTIME in the last 168 hours. Cardiac Enzymes: No results for input(s): CKTOTAL, CKMB, CKMBINDEX, TROPONINI in the last 168 hours. BNP (last 3 results) No results for input(s): PROBNP in the last 8760 hours. HbA1C: No results for input(s): HGBA1C in the last 72 hours. CBG: Recent Labs  Lab 05/31/21 2213  GLUCAP 137*   Lipid Profile: No results for input(s): CHOL, HDL, LDLCALC, TRIG, CHOLHDL, LDLDIRECT in the last 72 hours. Thyroid Function Tests: No results for input(s): TSH, T4TOTAL, FREET4, T3FREE, THYROIDAB in the last 72 hours. Anemia Panel: No results for input(s): VITAMINB12, FOLATE, FERRITIN, TIBC, IRON, RETICCTPCT in the last 72 hours. Sepsis Labs: No results for input(s): PROCALCITON, LATICACIDVEN in the last 168 hours.  Recent Results (from the past 240 hour(s))  Blood culture (routine x 2)     Status: None   Collection Time: 05/24/21 10:51 AM   Specimen: BLOOD  Result Value Ref Range Status   Specimen Description BLOOD RIGHT ANTECUBITAL  Final   Special Requests   Final    BOTTLES DRAWN AEROBIC AND ANAEROBIC Blood Culture adequate volume   Culture   Final    NO GROWTH 5 DAYS Performed at [facility], [address] [place], [place] [address]    Report Status 05/29/2021 FINAL  Final  Resp Panel by RT-PCR (Flu A&B, Covid) Nasopharyngeal Swab     Status: None   Collection Time: 05/24/21 11:14 AM   Specimen: Nasopharyngeal Swab; Nasopharyngeal(NP) swabs in vial transport medium  Result Value Ref Range Status   SARS Coronavirus 2 by RT PCR NEGATIVE NEGATIVE Final    Comment: (NOTE) SARS-CoV-2 target nucleic acids are NOT DETECTED.  The SARS-CoV-2 RNA is generally detectable in upper respiratory specimens during the acute phase of infection. The lowest concentration of SARS-CoV-2 viral copies this assay can detect is 138 copies/mL. A negative result does not preclude SARS-Cov-2 infection and should not be used as the sole basis for  treatment or other patient management decisions. A negative result may occur with  improper specimen collection/handling, submission of specimen other than nasopharyngeal swab, presence of viral mutation(s) within the areas targeted by this assay, and inadequate number of viral copies(<138 copies/mL). A negative result must be combined with clinical observations, patient history, and epidemiological information. The expected result is Negative.  Fact Sheet for Patients:  EntrepreneurPulse.com.au  Fact Sheet for Healthcare Providers:  IncredibleEmployment.be  This test is no t yet approved or cleared by the [place] FDA and  has been authorized for detection and/or diagnosis of SARS-CoV-2 by FDA under an Emergency Use Authorization (EUA). This EUA will remain  in effect (meaning this test can be used) for the duration of the COVID-19 declaration under Section 564(b)(1) of the Act, 21 U.S.C.section 360bbb-3(b)(1), unless the authorization is terminated  or revoked sooner.       Influenza A by PCR NEGATIVE NEGATIVE Final   Influenza B by PCR NEGATIVE NEGATIVE Final    Comment: (NOTE) The Xpert Xpress SARS-CoV-2/FLU/RSV plus assay is intended as an aid in the diagnosis of influenza from Nasopharyngeal swab specimens and should not be used as a sole basis for treatment. Nasal washings and aspirates are unacceptable for Xpert Xpress SARS-CoV-2/FLU/RSV testing.  Fact Sheet for Patients: EntrepreneurPulse.com.au  Fact Sheet for Healthcare Providers: IncredibleEmployment.be  This test is not yet approved or cleared by the [place] FDA and has been authorized for detection and/or diagnosis of SARS-CoV-2 by FDA under an Emergency Use Authorization (EUA). This EUA will remain in effect (meaning this test can be used) for the duration of the COVID-19 declaration under Section 564(b)(1) of the Act, 21  U.S.C. section 360bbb-3(b)(1), unless the authorization is terminated or revoked.  Performed at [facility], [place] [address] [place], [place] [address]   Blood culture (routine x 2)     Status: None   Collection Time: 05/24/21 12:03 PM   Specimen: BLOOD  Result Value Ref Range Status   Specimen Description BLOOD LEFT ANTECUBITAL  Final   Special Requests   Final    BOTTLES DRAWN AEROBIC AND ANAEROBIC Blood Culture adequate volume   Culture   Final    NO GROWTH 5 DAYS Performed at [facility], [place] [address], [place], [place] [address]    Report Status 05/29/2021 FINAL  Final  Urine culture     Status: None   Collection Time: 05/24/21 12:48 PM   Specimen: Urine, Clean Catch  Result Value Ref Range Status   Specimen Description URINE, CLEAN CATCH  Final   Special Requests NONE  Final   Culture   Final    NO GROWTH Performed at [facility], [place] [address], [place],  [address]  Report Status 05/25/2021 FINAL  Final  CSF culture w Gram Stain     Status: None   Collection Time: 05/27/21  7:14 PM   Specimen: CSF; Cerebrospinal Fluid  Result Value Ref Range Status   Specimen Description CSF  Final   Special Requests NONE  Final   Gram Stain   Final    WBC PRESENT, PREDOMINANTLY MONONUCLEAR NO ORGANISMS SEEN CYTOSPIN SMEAR    Culture   Final    NO GROWTH 3 DAYS Performed at [facility], [place] [address], [place], [place] [address]    Report Status 05/31/2021 FINAL  Final  Culture, blood (routine x 2)     Status: None (Preliminary result)   Collection Time: 05/30/21  4:11 PM   Specimen: BLOOD RIGHT HAND  Result Value Ref Range Status   Specimen Description BLOOD RIGHT HAND  Final   Special Requests   Final    BOTTLES DRAWN AEROBIC AND ANAEROBIC Blood Culture results may not be optimal due to an inadequate volume of blood received in culture bottles   Culture   Final    NO GROWTH 3 DAYS Performed at [facility], [place] [address] [place], [place] [address]    Report Status PENDING  Incomplete  Culture, blood (routine x 2)     Status: None (Preliminary result)   Collection Time: 05/30/21  4:11 PM   Specimen: BLOOD RIGHT HAND  Result Value Ref Range Status   Specimen Description BLOOD RIGHT HAND  Final   Special Requests   Final    BOTTLES DRAWN AEROBIC ONLY Blood Culture results may not be optimal due to an inadequate volume of blood received in culture bottles   Culture   Final    NO GROWTH 3 DAYS Performed at [facility], [place] [address], [place], [place] [address]    Report Status PENDING  Incomplete         Radiology Studies: IR US Guide Vasc Access Right  Result Date: 06/01/2021 INDICATION: 70 year old female with past medical history significant for hypertension presenting with left-sided weakness and fever. 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 Given the possibility of CNS vasculitis, a diagnostic cerebral angiogram was requested. EXAM: ULTRASOUND-GUIDED VASCULAR ACCESS DIAGNOSTIC CEREBRAL ANGIOGRAM COMPARISON:  CT/CT angiogram of the head and neck May 26, 2021 MEDICATIONS: 5,000 IU heparin, 5 mg Verapamil and 200 mcg nitroglycerin ANESTHESIA/SEDATION: Versed 1 mg IV; Fentanyl 25 mcg IV Moderate Sedation Time:  46 minutes The patient was continuously monitored during the procedure by the interventional radiology nurse under my direct supervision. CONTRAST:  78 mL of Omnipaque 300 milligrams/mL FLUOROSCOPY TIME:  Fluoroscopy Time: 11 minutes 6 seconds (331 mGy). COMPLICATIONS: None immediate. TECHNIQUE: Informed written consent was obtained from the patient after a thorough discussion of the procedural risks, benefits and alternatives. All questions were addressed. Maximal Sterile Barrier Technique was utilized including caps, mask, sterile gowns, sterile gloves, sterile drape, hand hygiene and skin antiseptic. A timeout  was performed prior to the initiation of the procedure. Using the modified Seldinger technique and a micropuncture kit, access was gained to the distal right radial artery at the anatomical snuffbox and a 5 French sheath was placed. Slow intra arterial infusion of 5,000 IU heparin, 5 mg Verapamil and 200 mcg nitroglycerin diluted in patient's own blood was performed. No significant fluctuation in patient's blood pressure seen. Then, a right radial artery roadmap  was obtained via sheath side port. Normal brachial artery branching pattern seen. No significant anatomical variation. The right radial artery caliber is adequate for vascular access. Next, a 5 [place] Simmons 2 glide catheter was navigated over a 0.035" Terumo Glidewire into the right subclavian artery under fluoroscopic guidance. Under fluoroscopy the catheter was placed into the right vertebral artery. Frontal, lateral and magnified bilateral oblique views of the head were obtained. The catheter was then placed into the right common carotid artery. Frontal and lateral views of the neck were obtained. Under biplane roadmap, the catheter was placed into the right internal carotid artery. Frontal, lateral and magnified oblique views of the head were obtained. The catheter was subsequently placed into the right external carotid artery. Frontal and lateral angiograms of the head were obtained. Next, the catheter was placed into the left common carotid artery. Frontal and lateral views of the neck were obtained followed by frontal, lateral and magnified oblique views of the head. The catheter was subsequently withdrawn. An inflatable band was placed and inflated over the right hand access site. The vascular sheath was withdrawn and the band was slowly deflated until brisk flow was noted through the arteriotomy site. At this point, the band was reinflated with additional 2 cc of air to obtain patent hemostasis. FINDINGS: Right radial artery ultrasound and right  radial artery angiogram: The caliber of the distal right radial artery is appropriate for angiogram access. The right radial artery and the right ulnar artery have normal course and caliber. No significant anatomical variants noted. Right vertebral artery angiograms: The right vertebral artery, basilar artery, and bilateral posterior cerebral arteries are unremarkable. The left vertebral artery seen by contrast reflux and also appear unremarkable. Luminal caliber is smooth and tapering. No aneurysms or abnormally high-flow, early draining veins are seen. No regions of abnormal hypervascularity are noted. The visualized dural sinuses are patent. Right CCA angiograms: Cervical angiograms show normal course and caliber of the visualized right common carotid and internal carotid arteries. There are no significant stenoses. Right ICA angiograms: There is brisk vascular contrast filling of the ACA and MCA vascular trees. Luminal caliber is smooth and tapering. No aneurysms or abnormally high-flow, early draining veins are seen. No regions of abnormal hypervascularity are noted. The visualized dural sinuses are patent. Right ECA and right occipital angiograms: No early venous drainage was noted. The visualized branches of the right external carotid artery are unremarkable. Left CCA angiograms: Cervical angiograms show normal caliber of the visualized left common carotid and internal carotid arteries. Increased tortuosity of the mid cervical segment of the left ICA with mild luminal irregularity. There are no significant stenoses. Left CCA angiograms - cranial views: There is brisk vascular contrast filling of the ACA and MCA vascular trees. Luminal caliber is smooth and tapering. No aneurysms or abnormally high-flow, early draining veins are seen. No regions of abnormal hypervascularity are noted. The visualized dural sinuses are patent. The visualized branches of the left external carotid artery are unremarkable.  PROCEDURE: No intervention performed. IMPRESSION: 1. No angiographic evidence of vasculitis. 2. No significant intracranial stenosis, vasospasm, vascular occlusion, aneurysm, AVM, dural AV fistula or other significant vascular abnormality. PLAN: Results communicated to the neurology team. Electronically Signed   By: Pedro Earls M.D.   On: 06/01/2021 17:01   VAS [place] UPPER EXTREMITY ARTERIAL DUPLEX  Result Date: 06/01/2021  UPPER EXTREMITY DUPLEX STUDY [facility]  Date of Exam:   06/01/2021 Medical Rec #: [id number]  Accession #:    [id number] Date of Birth: January 24, 1951          Patient Gender: F Patient Age:   69Y Exam Location:  [facility] Procedure:      VAS [place] UPPER EXTREMITY ARTERIAL DUPLEX Referring Phys: [id number] [place] --------------------------------------------------------------------------------  Indications: Bruising and Pain. History:     Patient has a history of catheterization via left radial artery.  Risk Factors: Hypertension. Limitations: TR band in place on the right wrist. Comparison Study: No prior studies. Performing Technologist: Oliver Hum RVT  Examination Guidelines: A complete evaluation includes B-mode imaging, spectral Doppler, color Doppler, and power Doppler as needed of all accessible portions of each vessel. Bilateral testing is considered an integral part of a complete examination. Limited examinations for reoccurring indications may be performed as noted.  Right Doppler Findings: +---------------+----------+---------+--------+--------+ Site           PSV (cm/s)Waveform StenosisComments +---------------+----------+---------+--------+--------+ Subclavian Dist          triphasic                 +---------------+----------+---------+--------+--------+ Brachial Dist            triphasic                 +---------------+----------+---------+--------+--------+ Radial Dist              triphasic                  +---------------+----------+---------+--------+--------+ Ulnar Dist               triphasic                 +---------------+----------+---------+--------+--------+ Palmar Arch              triphasic                 +---------------+----------+---------+--------+--------+ There is no evidence of a pseudoaneurysm. There is evidence of a channel leading off of the distal radial artery. The channel is located beneath the TR band.     Preliminary    IR ANGIO INTRA EXTRACRAN SEL COM CAROTID INNOMINATE UNI L MOD SED  Result Date: 06/01/2021 INDICATION: 70 year old female with past medical history significant for hypertension presenting with left-sided weakness and fever. 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 Given the possibility of CNS vasculitis, a diagnostic cerebral angiogram was requested. EXAM: ULTRASOUND-GUIDED VASCULAR ACCESS DIAGNOSTIC CEREBRAL ANGIOGRAM COMPARISON:  CT/CT angiogram of the head and neck May 26, 2021 MEDICATIONS: 5,000 IU heparin, 5 mg Verapamil and 200 mcg nitroglycerin ANESTHESIA/SEDATION: Versed 1 mg IV; Fentanyl 25 mcg IV Moderate Sedation Time:  46 minutes The patient was continuously monitored during the procedure by the interventional radiology nurse under my direct supervision. CONTRAST:  78 mL of Omnipaque 300 milligrams/mL FLUOROSCOPY TIME:  Fluoroscopy Time: 11 minutes 6 seconds (331 mGy). COMPLICATIONS: None immediate. TECHNIQUE: Informed written consent was obtained from the patient after a thorough discussion of the procedural risks, benefits and alternatives. All questions were addressed. Maximal Sterile Barrier Technique was utilized including caps, mask, sterile gowns, sterile gloves, sterile drape, hand hygiene and skin antiseptic. A timeout was performed prior to the initiation of the procedure. Using the modified Seldinger technique and a micropuncture kit, access was  gained to the distal right radial artery at the anatomical snuffbox and a 5 French sheath was placed. Slow intra arterial infusion of 5,000 IU heparin, 5  mg Verapamil and 200 mcg nitroglycerin diluted in patient's own blood was performed. No significant fluctuation in patient's blood pressure seen. Then, a right radial artery roadmap was obtained via sheath side port. Normal brachial artery branching pattern seen. No significant anatomical variation. The right radial artery caliber is adequate for vascular access. Next, a 5 [place] Simmons 2 glide catheter was navigated over a 0.035" Terumo Glidewire into the right subclavian artery under fluoroscopic guidance. Under fluoroscopy the catheter was placed into the right vertebral artery. Frontal, lateral and magnified bilateral oblique views of the head were obtained. The catheter was then placed into the right common carotid artery. Frontal and lateral views of the neck were obtained. Under biplane roadmap, the catheter was placed into the right internal carotid artery. Frontal, lateral and magnified oblique views of the head were obtained. The catheter was subsequently placed into the right external carotid artery. Frontal and lateral angiograms of the head were obtained. Next, the catheter was placed into the left common carotid artery. Frontal and lateral views of the neck were obtained followed by frontal, lateral and magnified oblique views of the head. The catheter was subsequently withdrawn. An inflatable band was placed and inflated over the right hand access site. The vascular sheath was withdrawn and the band was slowly deflated until brisk flow was noted through the arteriotomy site. At this point, the band was reinflated with additional 2 cc of air to obtain patent hemostasis. FINDINGS: Right radial artery ultrasound and right radial artery angiogram: The caliber of the distal right radial artery is appropriate for angiogram access. The right radial artery  and the right ulnar artery have normal course and caliber. No significant anatomical variants noted. Right vertebral artery angiograms: The right vertebral artery, basilar artery, and bilateral posterior cerebral arteries are unremarkable. The left vertebral artery seen by contrast reflux and also appear unremarkable. Luminal caliber is smooth and tapering. No aneurysms or abnormally high-flow, early draining veins are seen. No regions of abnormal hypervascularity are noted. The visualized dural sinuses are patent. Right CCA angiograms: Cervical angiograms show normal course and caliber of the visualized right common carotid and internal carotid arteries. There are no significant stenoses. Right ICA angiograms: There is brisk vascular contrast filling of the ACA and MCA vascular trees. Luminal caliber is smooth and tapering. No aneurysms or abnormally high-flow, early draining veins are seen. No regions of abnormal hypervascularity are noted. The visualized dural sinuses are patent. Right ECA and right occipital angiograms: No early venous drainage was noted. The visualized branches of the right external carotid artery are unremarkable. Left CCA angiograms: Cervical angiograms show normal caliber of the visualized left common carotid and internal carotid arteries. Increased tortuosity of the mid cervical segment of the left ICA with mild luminal irregularity. There are no significant stenoses. Left CCA angiograms - cranial views: There is brisk vascular contrast filling of the ACA and MCA vascular trees. Luminal caliber is smooth and tapering. No aneurysms or abnormally high-flow, early draining veins are seen. No regions of abnormal hypervascularity are noted. The visualized dural sinuses are patent. The visualized branches of the left external carotid artery are unremarkable. PROCEDURE: No intervention performed. IMPRESSION: 1. No angiographic evidence of vasculitis. 2. No significant intracranial stenosis,  vasospasm, vascular occlusion, aneurysm, AVM, dural AV fistula or other significant vascular abnormality. PLAN: Results communicated to the neurology team. Electronically Signed   By: Pedro Earls M.D.   On: 06/01/2021 17:01   IR ANGIO INTRA EXTRACRAN SEL  INTERNAL CAROTID UNI R MOD SED  Result Date: 06/01/2021 INDICATION: 70 year old female with past medical history significant for hypertension presenting with left-sided weakness and fever. 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 Given the possibility of CNS vasculitis, a diagnostic cerebral angiogram was requested. EXAM: ULTRASOUND-GUIDED VASCULAR ACCESS DIAGNOSTIC CEREBRAL ANGIOGRAM COMPARISON:  CT/CT angiogram of the head and neck May 26, 2021 MEDICATIONS: 5,000 IU heparin, 5 mg Verapamil and 200 mcg nitroglycerin ANESTHESIA/SEDATION: Versed 1 mg IV; Fentanyl 25 mcg IV Moderate Sedation Time:  46 minutes The patient was continuously monitored during the procedure by the interventional radiology nurse under my direct supervision. CONTRAST:  78 mL of Omnipaque 300 milligrams/mL FLUOROSCOPY TIME:  Fluoroscopy Time: 11 minutes 6 seconds (331 mGy). COMPLICATIONS: None immediate. TECHNIQUE: Informed written consent was obtained from the patient after a thorough discussion of the procedural risks, benefits and alternatives. All questions were addressed. Maximal Sterile Barrier Technique was utilized including caps, mask, sterile gowns, sterile gloves, sterile drape, hand hygiene and skin antiseptic. A timeout was performed prior to the initiation of the procedure. Using the modified Seldinger technique and a micropuncture kit, access was gained to the distal right radial artery at the anatomical snuffbox and a 5 French sheath was placed. Slow intra arterial infusion of 5,000 IU heparin, 5 mg Verapamil and 200 mcg nitroglycerin diluted in patient's own blood was  performed. No significant fluctuation in patient's blood pressure seen. Then, a right radial artery roadmap was obtained via sheath side port. Normal brachial artery branching pattern seen. No significant anatomical variation. The right radial artery caliber is adequate for vascular access. Next, a 5 [place] Simmons 2 glide catheter was navigated over a 0.035" Terumo Glidewire into the right subclavian artery under fluoroscopic guidance. Under fluoroscopy the catheter was placed into the right vertebral artery. Frontal, lateral and magnified bilateral oblique views of the head were obtained. The catheter was then placed into the right common carotid artery. Frontal and lateral views of the neck were obtained. Under biplane roadmap, the catheter was placed into the right internal carotid artery. Frontal, lateral and magnified oblique views of the head were obtained. The catheter was subsequently placed into the right external carotid artery. Frontal and lateral angiograms of the head were obtained. Next, the catheter was placed into the left common carotid artery. Frontal and lateral views of the neck were obtained followed by frontal, lateral and magnified oblique views of the head. The catheter was subsequently withdrawn. An inflatable band was placed and inflated over the right hand access site. The vascular sheath was withdrawn and the band was slowly deflated until brisk flow was noted through the arteriotomy site. At this point, the band was reinflated with additional 2 cc of air to obtain patent hemostasis. FINDINGS: Right radial artery ultrasound and right radial artery angiogram: The caliber of the distal right radial artery is appropriate for angiogram access. The right radial artery and the right ulnar artery have normal course and caliber. No significant anatomical variants noted. Right vertebral artery angiograms: The right vertebral artery, basilar artery, and bilateral posterior cerebral arteries are  unremarkable. The left vertebral artery seen by contrast reflux and also appear unremarkable. Luminal caliber is smooth and tapering. No aneurysms or abnormally high-flow, early draining veins are seen. No regions of abnormal hypervascularity are noted. The visualized dural sinuses are patent. Right CCA angiograms: Cervical angiograms show normal course and caliber of the visualized right  common carotid and internal carotid arteries. There are no significant stenoses. Right ICA angiograms: There is brisk vascular contrast filling of the ACA and MCA vascular trees. Luminal caliber is smooth and tapering. No aneurysms or abnormally high-flow, early draining veins are seen. No regions of abnormal hypervascularity are noted. The visualized dural sinuses are patent. Right ECA and right occipital angiograms: No early venous drainage was noted. The visualized branches of the right external carotid artery are unremarkable. Left CCA angiograms: Cervical angiograms show normal caliber of the visualized left common carotid and internal carotid arteries. Increased tortuosity of the mid cervical segment of the left ICA with mild luminal irregularity. There are no significant stenoses. Left CCA angiograms - cranial views: There is brisk vascular contrast filling of the ACA and MCA vascular trees. Luminal caliber is smooth and tapering. No aneurysms or abnormally high-flow, early draining veins are seen. No regions of abnormal hypervascularity are noted. The visualized dural sinuses are patent. The visualized branches of the left external carotid artery are unremarkable. PROCEDURE: No intervention performed. IMPRESSION: 1. No angiographic evidence of vasculitis. 2. No significant intracranial stenosis, vasospasm, vascular occlusion, aneurysm, AVM, dural AV fistula or other significant vascular abnormality. PLAN: Results communicated to the neurology team. Electronically Signed   By: Pedro Earls M.D.   On:  06/01/2021 17:01   IR ANGIO VERTEBRAL SEL VERTEBRAL UNI R MOD SED  Result Date: 06/01/2021 INDICATION: 69 year old female with past medical history significant for hypertension presenting with left-sided weakness and fever. 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 Given the possibility of CNS vasculitis, a diagnostic cerebral angiogram was requested. EXAM: ULTRASOUND-GUIDED VASCULAR ACCESS DIAGNOSTIC CEREBRAL ANGIOGRAM COMPARISON:  CT/CT angiogram of the head and neck May 26, 2021 MEDICATIONS: 5,000 IU heparin, 5 mg Verapamil and 200 mcg nitroglycerin ANESTHESIA/SEDATION: Versed 1 mg IV; Fentanyl 25 mcg IV Moderate Sedation Time:  46 minutes The patient was continuously monitored during the procedure by the interventional radiology nurse under my direct supervision. CONTRAST:  78 mL of Omnipaque 300 milligrams/mL FLUOROSCOPY TIME:  Fluoroscopy Time: 11 minutes 6 seconds (331 mGy). COMPLICATIONS: None immediate. TECHNIQUE: Informed written consent was obtained from the patient after a thorough discussion of the procedural risks, benefits and alternatives. All questions were addressed. Maximal Sterile Barrier Technique was utilized including caps, mask, sterile gowns, sterile gloves, sterile drape, hand hygiene and skin antiseptic. A timeout was performed prior to the initiation of the procedure. Using the modified Seldinger technique and a micropuncture kit, access was gained to the distal right radial artery at the anatomical snuffbox and a 5 French sheath was placed. Slow intra arterial infusion of 5,000 IU heparin, 5 mg Verapamil and 200 mcg nitroglycerin diluted in patient's own blood was performed. No significant fluctuation in patient's blood pressure seen. Then, a right radial artery roadmap was obtained via sheath side port. Normal brachial artery branching pattern seen. No significant anatomical variation. The right  radial artery caliber is adequate for vascular access. Next, a 5 [place] Simmons 2 glide catheter was navigated over a 0.035" Terumo Glidewire into the right subclavian artery under fluoroscopic guidance. Under fluoroscopy the catheter was placed into the right vertebral artery. Frontal, lateral and magnified bilateral oblique views of the head were obtained. The catheter was then placed into the right common carotid artery. Frontal and lateral views of the neck were obtained. Under biplane roadmap, the catheter was placed into the right internal  carotid artery. Frontal, lateral and magnified oblique views of the head were obtained. The catheter was subsequently placed into the right external carotid artery. Frontal and lateral angiograms of the head were obtained. Next, the catheter was placed into the left common carotid artery. Frontal and lateral views of the neck were obtained followed by frontal, lateral and magnified oblique views of the head. The catheter was subsequently withdrawn. An inflatable band was placed and inflated over the right hand access site. The vascular sheath was withdrawn and the band was slowly deflated until brisk flow was noted through the arteriotomy site. At this point, the band was reinflated with additional 2 cc of air to obtain patent hemostasis. FINDINGS: Right radial artery ultrasound and right radial artery angiogram: The caliber of the distal right radial artery is appropriate for angiogram access. The right radial artery and the right ulnar artery have normal course and caliber. No significant anatomical variants noted. Right vertebral artery angiograms: The right vertebral artery, basilar artery, and bilateral posterior cerebral arteries are unremarkable. The left vertebral artery seen by contrast reflux and also appear unremarkable. Luminal caliber is smooth and tapering. No aneurysms or abnormally high-flow, early draining veins are seen. No regions of abnormal  hypervascularity are noted. The visualized dural sinuses are patent. Right CCA angiograms: Cervical angiograms show normal course and caliber of the visualized right common carotid and internal carotid arteries. There are no significant stenoses. Right ICA angiograms: There is brisk vascular contrast filling of the ACA and MCA vascular trees. Luminal caliber is smooth and tapering. No aneurysms or abnormally high-flow, early draining veins are seen. No regions of abnormal hypervascularity are noted. The visualized dural sinuses are patent. Right ECA and right occipital angiograms: No early venous drainage was noted. The visualized branches of the right external carotid artery are unremarkable. Left CCA angiograms: Cervical angiograms show normal caliber of the visualized left common carotid and internal carotid arteries. Increased tortuosity of the mid cervical segment of the left ICA with mild luminal irregularity. There are no significant stenoses. Left CCA angiograms - cranial views: There is brisk vascular contrast filling of the ACA and MCA vascular trees. Luminal caliber is smooth and tapering. No aneurysms or abnormally high-flow, early draining veins are seen. No regions of abnormal hypervascularity are noted. The visualized dural sinuses are patent. The visualized branches of the left external carotid artery are unremarkable. PROCEDURE: No intervention performed. IMPRESSION: 1. No angiographic evidence of vasculitis. 2. No significant intracranial stenosis, vasospasm, vascular occlusion, aneurysm, AVM, dural AV fistula or other significant vascular abnormality. PLAN: Results communicated to the neurology team. Electronically Signed   By: Pedro Earls M.D.   On: 06/01/2021 17:01   IR ANGIO EXTERNAL CAROTID SEL EXT CAROTID UNI R MOD SED  Result Date: 06/01/2021 INDICATION: 70 year old female with past medical history significant for hypertension presenting with left-sided weakness and  fever. 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 Given the possibility of CNS vasculitis, a diagnostic cerebral angiogram was requested. EXAM: ULTRASOUND-GUIDED VASCULAR ACCESS DIAGNOSTIC CEREBRAL ANGIOGRAM COMPARISON:  CT/CT angiogram of the head and neck May 26, 2021 MEDICATIONS: 5,000 IU heparin, 5 mg Verapamil and 200 mcg nitroglycerin ANESTHESIA/SEDATION: Versed 1 mg IV; Fentanyl 25 mcg IV Moderate Sedation Time:  46 minutes The patient was continuously monitored during the procedure by the interventional radiology nurse under my direct supervision. CONTRAST:  78 mL of Omnipaque 300 milligrams/mL FLUOROSCOPY TIME:  Fluoroscopy Time: 11 minutes 6 seconds (331 mGy). COMPLICATIONS: None immediate. TECHNIQUE: Informed written consent was obtained from the patient after a thorough discussion of the procedural risks, benefits and alternatives. All questions were addressed. Maximal Sterile Barrier Technique was utilized including caps, mask, sterile gowns, sterile gloves, sterile drape, hand hygiene and skin antiseptic. A timeout was performed prior to the initiation of the procedure. Using the modified Seldinger technique and a micropuncture kit, access was gained to the distal right radial artery at the anatomical snuffbox and a 5 French sheath was placed. Slow intra arterial infusion of 5,000 IU heparin, 5 mg Verapamil and 200 mcg nitroglycerin diluted in patient's own blood was performed. No significant fluctuation in patient's blood pressure seen. Then, a right radial artery roadmap was obtained via sheath side port. Normal brachial artery branching pattern seen. No significant anatomical variation. The right radial artery caliber is adequate for vascular access. Next, a 5 [place] Simmons 2 glide catheter was navigated over a 0.035" Terumo Glidewire into the right subclavian artery under fluoroscopic guidance. Under fluoroscopy the  catheter was placed into the right vertebral artery. Frontal, lateral and magnified bilateral oblique views of the head were obtained. The catheter was then placed into the right common carotid artery. Frontal and lateral views of the neck were obtained. Under biplane roadmap, the catheter was placed into the right internal carotid artery. Frontal, lateral and magnified oblique views of the head were obtained. The catheter was subsequently placed into the right external carotid artery. Frontal and lateral angiograms of the head were obtained. Next, the catheter was placed into the left common carotid artery. Frontal and lateral views of the neck were obtained followed by frontal, lateral and magnified oblique views of the head. The catheter was subsequently withdrawn. An inflatable band was placed and inflated over the right hand access site. The vascular sheath was withdrawn and the band was slowly deflated until brisk flow was noted through the arteriotomy site. At this point, the band was reinflated with additional 2 cc of air to obtain patent hemostasis. FINDINGS: Right radial artery ultrasound and right radial artery angiogram: The caliber of the distal right radial artery is appropriate for angiogram access. The right radial artery and the right ulnar artery have normal course and caliber. No significant anatomical variants noted. Right vertebral artery angiograms: The right vertebral artery, basilar artery, and bilateral posterior cerebral arteries are unremarkable. The left vertebral artery seen by contrast reflux and also appear unremarkable. Luminal caliber is smooth and tapering. No aneurysms or abnormally high-flow, early draining veins are seen. No regions of abnormal hypervascularity are noted. The visualized dural sinuses are patent. Right CCA angiograms: Cervical angiograms show normal course and caliber of the visualized right common carotid and internal carotid arteries. There are no significant  stenoses. Right ICA angiograms: There is brisk vascular contrast filling of the ACA and MCA vascular trees. Luminal caliber is smooth and tapering. No aneurysms or abnormally high-flow, early draining veins are seen. No regions of abnormal hypervascularity are noted. The visualized dural sinuses are patent. Right ECA and right occipital angiograms: No early venous drainage was noted. The visualized branches of the right external carotid artery are unremarkable. Left CCA angiograms: Cervical angiograms show normal caliber of the visualized left common carotid and internal carotid arteries. Increased tortuosity of the mid cervical segment of the left ICA with mild luminal irregularity. There are no significant stenoses. Left CCA angiograms - cranial views: There is brisk vascular contrast filling of the  ACA and MCA vascular trees. Luminal caliber is smooth and tapering. No aneurysms or abnormally high-flow, early draining veins are seen. No regions of abnormal hypervascularity are noted. The visualized dural sinuses are patent. The visualized branches of the left external carotid artery are unremarkable. PROCEDURE: No intervention performed. IMPRESSION: 1. No angiographic evidence of vasculitis. 2. No significant intracranial stenosis, vasospasm, vascular occlusion, aneurysm, AVM, dural AV fistula or other significant vascular abnormality. PLAN: Results communicated to the neurology team. Electronically Signed   By: Pedro Earls M.D.   On: 06/01/2021 17:01        Scheduled Meds:  amLODipine  5 mg Oral q AM   aspirin EC  81 mg Oral Daily   buPROPion  300 mg Oral Daily   calcium carbonate  1 tablet Oral Q breakfast   cholecalciferol  1,000 Units Oral Daily   clopidogrel  75 mg Oral Daily   enoxaparin (LOVENOX) injection  40 mg Subcutaneous Q24H   feeding supplement  237 mL Oral BID BM   hydrALAZINE  25 mg Oral BID   metoprolol succinate  100 mg Oral Daily   multivitamin with minerals  1  tablet Oral Daily   pantoprazole  40 mg Oral Daily   topiramate  150 mg Oral QHS   venlafaxine XR  75 mg Oral Q breakfast   vitamin B-12  1,000 mcg Oral Daily   Continuous Infusions:   LOS: 8 days    Time spent: 39 minutes spent on chart review, discussion with nursing staff, consultants, updating family and interview/physical exam; more than 50% of that time was spent in counseling and/or coordination of care.    Ward Boissonneault J [place] DO [facility] Available via Epic secure chat 7am-7pm After these hours, please refer to coverage provider listed on [email or web address] 06/02/2021, 1:38 PM

## 2021-06-02 NOTE — Progress Notes (Signed)
  Progress Note    06/02/2021 7:53 AM * No surgery date entered *  Subjective:  R hand with sensation intact; pain with any range of motion   Vitals:   06/02/21 0500 06/02/21 0652  BP: (!) 144/89 126/75  Pulse: 76   Resp: 18   Temp: 98 F (36.7 C)   SpO2: 95%    Physical Exam: Lungs:  non labored Incisions:  R radial cath site without hematoma Extremities:  palpable R radial pulse; ecchymosis dorsal R hand but soft; fingers are warm Neurologic: A&O  CBC    Component Value Date/Time   WBC 6.1 05/30/2021 1334   RBC 3.53 (L) 05/30/2021 1334   HGB 10.4 (L) 05/30/2021 1334   HCT 31.6 (L) 05/30/2021 1334   PLT 284 05/30/2021 1334   MCV 89.5 05/30/2021 1334   MCH 29.5 05/30/2021 1334   MCHC 32.9 05/30/2021 1334   RDW 15.4 05/30/2021 1334   LYMPHSABS 0.9 04/07/2021 1450   MONOABS 0.7 04/07/2021 1450   EOSABS 0.2 04/07/2021 1450   BASOSABS 0.1 04/07/2021 1450    BMET    Component Value Date/Time   NA 137 06/01/2021 0203   K 3.5 06/01/2021 0203   CL 109 06/01/2021 0203   CO2 20 (L) 06/01/2021 0203   GLUCOSE 146 (H) 06/01/2021 0203   BUN 18 06/01/2021 0203   CREATININE 0.58 06/01/2021 0203   CALCIUM 8.9 06/01/2021 0203   GFRNONAA >60 06/01/2021 0203   GFRAA >60 08/19/2020 0745    INR    Component Value Date/Time   INR 1.1 05/24/2021 1150     Intake/Output Summary (Last 24 hours) at 06/02/2021 0753 Last data filed at 06/02/2021 0557 Gross per 24 hour  Intake 237 ml  Output 2000 ml  Net -1763 ml     Assessment/Plan:  70 y.o. female is s/p cerebral angiogram via R radial artery  R dorsal hand hematoma is soft with no skin compromise R hand well perfused with palpable radial pulse and sensation intact; hand ROM limited due to pain Encouraged patient to exercise her R hand No indication for vascular intervention at this time   Dagoberto Ligas, PA-C [facility] [phone] 06/02/2021 7:53 AM

## 2021-06-02 NOTE — Progress Notes (Addendum)
Subjective: She feels that she is improving  Exam: Vitals:   06/02/21 0500 06/02/21 0652  BP: (!) 144/89 126/75  Pulse: 76   Resp: 18   Temp: 98 F (36.7 C)   SpO2: 95%    Gen: In bed, NAD Resp: non-labored breathing, no acute distress Abd: soft, nt  Neuro: MS: awake, alert, oriented BV:PLWUZ, VFF Motor: 5/5 on the right LE, limited in RUE due to pain, 4/5 LUE, 4+/5 LLE Sensory:intact to LT   Pertinent Labs: CSF WBC 2 CSF RBC 26 CSF Protein 82 CSF glucose 52 CSF VDRL -nonreactive CSF cytology - pending CSF oligoclonal bands - pending CSF mayo paraneoplstic panel - pending  ANA positive DSDNA - negative RF - negative Hepatitis antibody panel - negative RPR - non-reactive SSA,SSB -negative ANCA - pending C3,C4 -normal ACE -50 (normal) EBV Ab - consistent with previous infection  TSH normal B12 254 B1 normal HIV negative    CRP 6.8 ESR 37   Impression: 3 F with a history of hypertension who presented with left-sided weakness in the setting of fever.  MRI revealed right-sided stroke as well as left-sided T2 change of unclear etiology.  Work-up has proceeded as above.  Etiology continues to be unclear, possibilities including granulomatous disease, other inflammatory process, or neoplastic process.  With negative CSF I think that infectious process is much less likely.  Lymphomatoid granulomatosis (angiocentric lymphoma) or intravascular lymphoma I think would explain both stroke as well as the T2 change and therefore would be a unifying diagnosis.  Lymphoma can cause fevers as well.  Cytology is pending on this.  Steroids were started empirically, and she has completed a 3 day course of 1g daily.  Also, would rule out marantic endocarditis, culture negative endocarditis or PFO with TEE.  Recommendations: 1) TEE  2) repeat MRI today 3) follow-up pending testing as above  Roland Rack, MD [facility] [phone]  If 7pm- 7am, please  page neurology on call as listed in [place].

## 2021-06-03 ENCOUNTER — Inpatient Hospital Stay (HOSPITAL_COMMUNITY): Payer: Medicare HMO | Admitting: Certified Registered Nurse Anesthetist

## 2021-06-03 ENCOUNTER — Inpatient Hospital Stay (HOSPITAL_COMMUNITY): Payer: Medicare HMO

## 2021-06-03 ENCOUNTER — Encounter (HOSPITAL_COMMUNITY): Payer: Self-pay | Admitting: Family Medicine

## 2021-06-03 ENCOUNTER — Encounter (HOSPITAL_COMMUNITY)
Admission: EM | Disposition: A | Discharge status: Rehabilitation Facility | Payer: Self-pay | Source: Home / Self Care | Attending: Internal Medicine

## 2021-06-03 DIAGNOSIS — I10 Essential (primary) hypertension: Secondary | ICD-10-CM

## 2021-06-03 DIAGNOSIS — I639 Cerebral infarction, unspecified: Secondary | ICD-10-CM

## 2021-06-03 DIAGNOSIS — N1832 Chronic kidney disease, stage 3b: Secondary | ICD-10-CM

## 2021-06-03 HISTORY — PX: BUBBLE STUDY: SHX6837

## 2021-06-03 HISTORY — PX: TEE WITHOUT CARDIOVERSION: SHX5443

## 2021-06-03 LAB — OLIGOCLONAL BANDS, CSF + SERM

## 2021-06-03 SURGERY — ECHOCARDIOGRAM, TRANSESOPHAGEAL
Anesthesia: Monitor Anesthesia Care

## 2021-06-03 MED ORDER — PROPOFOL 10 MG/ML IV BOLUS
INTRAVENOUS | Status: DC | PRN
  Administered 2021-06-03 (×3): 10 mg via INTRAVENOUS

## 2021-06-03 MED ORDER — LIDOCAINE 2% (20 MG/ML) 5 ML SYRINGE
INTRAMUSCULAR | Status: DC | PRN
  Administered 2021-06-03: 60 mg via INTRAVENOUS

## 2021-06-03 MED ORDER — PHENOL 1.4 % MT LIQD: 1.0000 | OROMUCOSAL | Status: DC | PRN

## 2021-06-03 MED ORDER — ONDANSETRON HCL 4 MG/2ML IJ SOLN
INTRAMUSCULAR | Status: DC | PRN
  Administered 2021-06-03: 4 mg via INTRAVENOUS

## 2021-06-03 MED ORDER — PROPOFOL 500 MG/50ML IV EMUL
INTRAVENOUS | Status: DC | PRN
  Administered 2021-06-03: 100 ug/kg/min via INTRAVENOUS

## 2021-06-03 MED ORDER — BUTAMBEN-TETRACAINE-BENZOCAINE 2-2-14 % EX AERO
INHALATION_SPRAY | CUTANEOUS | Status: DC | PRN
  Administered 2021-06-03: 2 via TOPICAL

## 2021-06-03 MED ORDER — SODIUM CHLORIDE 0.9 % IV SOLN: INTRAVENOUS | Status: DC | PRN

## 2021-06-03 MED ORDER — PHENYLEPHRINE 40 MCG/ML (10ML) SYRINGE FOR IV PUSH (FOR BLOOD PRESSURE SUPPORT)
PREFILLED_SYRINGE | INTRAVENOUS | Status: DC | PRN
  Administered 2021-06-03 (×2): 80 ug via INTRAVENOUS

## 2021-06-03 MED ORDER — LACTATED RINGERS IV SOLN: INTRAVENOUS | Status: DC | PRN

## 2021-06-03 MED ORDER — CLOPIDOGREL BISULFATE 75 MG PO TABS
75.0000 mg | ORAL_TABLET | Freq: Every day | ORAL | Status: DC
  Administered 2021-06-04 – 2021-06-08 (×5): 75 mg via ORAL
  Filled 2021-06-03 (×5): qty 1

## 2021-06-03 NOTE — Interval H&P Note (Signed)
History and Physical Interval Note:  06/03/2021 7:24 AM  Michaela Rios  has presented today for surgery, with the diagnosis of RULE OUT ENDOCARDITIS.  The various methods of treatment have been discussed with the patient and family. After consideration of risks, benefits and other options for treatment, the patient has consented to  Procedure(s): TRANSESOPHAGEAL ECHOCARDIOGRAM (TEE) (N/A) as a surgical intervention.  The patient's history has been reviewed, patient examined, no change in status, stable for surgery.  I have reviewed the patient's chart and labs.  Questions were answered to the patient's satisfaction.     Donato Heinz

## 2021-06-03 NOTE — Anesthesia Procedure Notes (Signed)
Procedure Name: MAC Date/Time: 06/03/2021 7:41 AM Performed by: Dorthea Cove, CRNA Pre-anesthesia Checklist: Patient identified, Emergency Drugs available, Suction available, Patient being monitored and Timeout performed Patient Re-evaluated:Patient Re-evaluated prior to induction Oxygen Delivery Method: Nasal cannula Preoxygenation: Pre-oxygenation with 100% oxygen Induction Type: IV induction Placement Confirmation: CO2 detector

## 2021-06-03 NOTE — Progress Notes (Signed)
SLP Cancellation Note  Patient Details Name: Michaela Rios MRN: [id number] DOB: September 12, 1951   Cancelled treatment:       Reason Eval/Treat Not Completed: Patient at procedure or test/unavailable. Will continue efforts.    Osie Bond., M.A. [place] [facility] Pager [phone] Office [phone]  06/03/2021, 7:39 AM

## 2021-06-03 NOTE — Progress Notes (Signed)
Physical Therapy Treatment Patient Details Name: Michaela Rios MRN: [id number] DOB: 1951-09-30 Today's Date: 06/03/2021    History of Present Illness Pt is a 70 y.o. female admitted 05/24/21 with c/o weakness, fall due to loss of balance, poor PO intake. Workup for sepsis, unclear etiology. Pt with confusion, neuro consult for possible CNS infection. Brain MRI 6/29 showed R basal ganglia stroke in addition to accelerated small vessel disease. 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 PMH includes HTN, CKD 3, migraines, chronic LBP, prediabetes, anxiety, depression.    PT Comments    Pt with some regression noted this session, requiring physical assistance to maintain all unsupported sitting and standing. Pt with tendency for posterior lean when mobilizing, and while reporting awareness of the lean she is unable to correct. Pt continues to require tactile cues or physical initiation during transfers and gait training. Pt will continue to benefit from aggressive mobilization and acute PT services to improve mobility quality and reduce falls risk.   Follow Up Recommendations  CIR;Supervision/Assistance - 24 hour     Equipment Recommendations  None recommended by PT    Recommendations for Other Services       Precautions / Restrictions Precautions Precautions: Fall Precaution Comments: urinary urgency/incontinence (has Depends in room) Restrictions Weight Bearing Restrictions: No    Mobility  Bed Mobility Overal bed mobility: Needs Assistance Bed Mobility: Rolling;Sidelying to Sit Rolling: Min assist Sidelying to sit: Mod assist       General bed mobility comments: use of bed rails and PT cues for sequencing    Transfers Overall transfer level: Needs assistance Equipment used: 1 person hand held assist Transfers: Sit to/from Stand Sit to Stand: Mod assist         General transfer comment: pt with posterior lean throughout  session, PT attempts cues to facilitate trunk flexion and anterior lean but is largely unsuccessful  Ambulation/Gait Ambulation/Gait assistance: Mod assist Gait Distance (Feet): 20 Feet (20' x 2) Assistive device: None Gait Pattern/deviations: Step-through pattern Gait velocity: reduced Gait velocity interpretation: <1.8 ft/sec, indicate of risk for recurrent falls General Gait Details: pt with slowed step-through gait, pt demonstrates significant posterior lean, multiple times requiring mod-maxA to maintain balance. PT provides minA to facilitate lateral weight shift throughout gait   Stairs             Wheelchair Mobility    Modified Rankin (Stroke Patients Only) Modified Rankin (Stroke Patients Only) Pre-Morbid Rankin Score: No significant disability Modified Rankin: Moderately severe disability     Balance Overall balance assessment: Needs assistance Sitting-balance support: Single extremity supported;Bilateral upper extremity supported;Feet supported Sitting balance-Leahy Scale: Poor Sitting balance - Comments: modA due to posterior lean, pt unable to correct with UE support. Pt also initially leaning posterior right, after 1st bout of ambulation pt leaning posterior right Postural control: Left lateral lean;Right lateral lean;Posterior lean Standing balance support: No upper extremity supported Standing balance-Leahy Scale: Poor Standing balance comment: modA-maxA due to posterior lean                            Cognition Arousal/Alertness: Awake/alert Behavior During Therapy: Flat affect Overall Cognitive Status: Impaired/Different from baseline Area of Impairment: Attention;Following commands;Safety/judgement;Awareness;Problem solving                   Current Attention Level: Sustained Memory: Decreased short-term memory Following Commands: Follows one step commands consistently;Follows multi-step commands with increased  time Safety/Judgement: Decreased awareness of safety;Decreased awareness of deficits Awareness: Emergent Problem Solving: Slow processing;Requires verbal cues;Requires tactile cues;Difficulty sequencing;Decreased initiation        Exercises      General Comments General comments (skin integrity, edema, etc.): VSS on RA, pt sleeping for most of day after TEE, appears drowsy at times during PT session      Pertinent Vitals/Pain Pain Assessment: Faces Faces Pain Scale: Hurts a little bit Pain Location: RLQ Pain Descriptors / Indicators: Aching Pain Intervention(s): Monitored during session    Home Living                      Prior Function            PT Goals (current goals can now be found in the care plan section) Acute Rehab PT Goals Patient Stated Goal: Ready to go to rehab Progress towards PT goals: Not progressing toward goals - comment (increased assistance requirements for all mobility)    Frequency    Min 4X/week      PT Plan Current plan remains appropriate    Co-evaluation              AM-PAC PT "6 Clicks" Mobility   Outcome Measure  Help needed turning from your back to your side while in a flat bed without using bedrails?: A Little Help needed moving from lying on your back to sitting on the side of a flat bed without using bedrails?: A Lot Help needed moving to and from a bed to a chair (including a wheelchair)?: A Lot Help needed standing up from a chair using your arms (e.g., wheelchair or bedside chair)?: A Lot Help needed to walk in hospital room?: A Lot Help needed climbing 3-5 steps with a railing? : Total 6 Click Score: 12    End of Session Equipment Utilized During Treatment: Gait belt Activity Tolerance: Patient tolerated treatment well Patient left: in chair;with call bell/phone within reach;with chair alarm set;with family/visitor present Nurse Communication: Mobility status PT Visit Diagnosis: Other abnormalities of  gait and mobility (R26.89);Muscle weakness (generalized) (M62.81);Other symptoms and signs involving the nervous system (R29.898)     Time: 7591-6384 PT Time Calculation (min) (ACUTE ONLY): 32 min  Charges:  $Gait Training: 8-22 mins $Therapeutic Activity: 8-22 mins                     Zenaida Niece, PT, DPT Acute Rehabilitation Pager: [place] 06/03/2021, 5:59 PM

## 2021-06-03 NOTE — Progress Notes (Signed)
STROKE TEAM PROGRESS NOTE   INTERVAL HISTORY Her husband is at the bedside.  He states patient has not had any neurological changes in the last 2 days after cerebral catheter angiogram however repeat MRI scan shows multiple small embolic looking by cerebral tiny punctate infarcts in addition to the large right subcortical infarct that she had previous MRI.  The previously seen left basal ganglia and thalamic T2/flair hyperintensities appear to be less prominent and remain of unclear significance.  There is a tiny nonspinning left thalamic infarct which is likely subacute with no other major abnormal enhancement. TEE is positive for a large right-to-left shunt but no vegetation or clot is noted Vitals:   06/03/21 0827 06/03/21 0837 06/03/21 0851 06/03/21 1353  BP: 125/87 131/81 126/79 112/79  Pulse: 98 99 94 95  Resp: 20 (!) 23 (!) 22 20  Temp:   98.6 F (37 C) 98.2 F (36.8 C)  TempSrc:   Oral Oral  SpO2: 96% 94% 96% 93%  Weight:      Height:       CBC:  Recent Labs  Lab 05/30/21 1334 06/02/21 0826  WBC 6.1 10.0  HGB 10.4* 11.5*  HCT 31.6* 34.8*  MCV 89.5 89.5  PLT 284 572   Basic Metabolic Panel:  Recent Labs  Lab 06/01/21 0203 06/02/21 0826  NA 137 140  K 3.5 3.7  CL 109 111  CO2 20* 20*  GLUCOSE 146* 123*  BUN 18 15  CREATININE 0.58 0.66  CALCIUM 8.9 9.1   Lipid Panel: LDL 45 HgbA1c: No results for input(s): HGBA1C in the last 168 hours. Urine Drug Screen: No results for input(s): LABOPIA, COCAINSCRNUR, LABBENZ, AMPHETMU, THCU, LABBARB in the last 168 hours.  Alcohol Level No results for input(s): ETH in the last 168 hours.  IMAGING past 24 hours ECHO TEE  Result Date: 06/03/2021    TRANSESOPHOGEAL ECHO REPORT   Patient Name:   Michaela Rios [facility] Date of Exam: 06/03/2021 Medical Rec #:  [id number]         Height:       63.0 in Accession IMPRESSIONS   1. Left ventricular ejection fraction, by estimation, is 60 to 65%. The left ventricle has normal function.   2.  Right ventricular systolic function is normal. The right ventricular size is normal.   3. No left atrial/left atrial appendage thrombus was detected.   4. The mitral valve is normal in structure. Trivial mitral valve regurgitation.   5. The aortic valve is tricuspid. Aortic valve regurgitation is not visualized.   6. Agitated saline contrast bubble study was positive with shunting observed within 3-6 cardiac cycles suggestive of interatrial shunt. Significant shunting seen, suggestive of large patent foramen ovale.   MRI brain Result date: 06/02/2021 IMPRESSION: 1. 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 2. Expected evolution of right basal ganglia infarct. 3. Decreased T2 signal abnormality and enhancement in the left greater than right deep gray nuclei and deep white matter tracts with differential considerations as previously outlined.  Cetebral angiogram Result date: 06/01/2021 IMPRESSION: 1. No angiographic evidence of vasculitis. 2. No significant intracranial stenosis, vasospasm, vascular occlusion, aneurysm, AVM, dural AV fistula or other significant vascular abnormality.  CTA head/neck: Result date 05/26/2021 IMPRESSION: 1. Minimal atherosclerosis in the head and neck without large vessel occlusion or significant stenosis. 2. Aortic Atherosclerosis (ICD10-I70.0).  MRI brain Result date 05/26/2021   IMPRESSION: Mild, largely vascular/perivascular appearing enhancement in the areas of signal abnormality in the  deep gray nuclei as well as a small amount of leptomeningeal enhancement over the right and possibly left parietal convexities. No solid enhancing mass.   Differential diagnosis includes infection (including cryptococcus), vasculitis, unusual neoplasm (such as angiocentric lymphoma), and granulomatous/autoimmune/other inflammatory conditions.  CT head Result date 05/26/2021 IMPRESSION: New multifocal hypoattenuation  in the basal ganglia and thalami bilaterally of indeterminate etiology. Considerations include multifocal infarcts (including venous infarcts), toxic/metabolic insult, hypoxic-ischemic injury, and infection. No mass effect to suggest tumor. Correlate with pending MRI.   CT abd/pelvis Result date: 05/25/2021 Hepatic steatosis.  No acute findings in the abdomen or pelvis.  Pertinent labs: LP No malignantcelss CSF WBC 2 CSF RBC 26 CSF Protein 82 CSF glucose 52 CSF VDRL -nonreactive CSF cytology - pending CSF oligoclonal bands - pending CSF mayo paraneoplstic panel - pending   ANA positive DSDNA - negative RF - negative Hepatitis antibody panel - negative RPR - non-reactive SSA,SSB -negative ANCA - pending C3,C4 -normal ACE -50 (normal) EBV Ab - consistent with previous infection   TSH normal B12 254 B1 normal HIV negative     CRP 6.8 ESR 37  PHYSICAL EXAM Pleasant elderly lady not in distress. . Afebrile. Head is nontraumatic. Neck is supple without bruit.    Cardiac exam no murmur or gallop. Lungs are clear to auscultation. Distal pulses are well felt.  Neurological Exam; Awake alert oriented to time place and person.  Diminished recall 2/3.  Able to name animals and objects well.  Follows commands well.  Extraocular movements are full range without nystagmus.  Blinks to threat bilaterally.  Fundi not visualized.  Face is symmetric.  Tongue midline.  Motor system exam no focal drift or weakness but slight diminished fine finger movements on the left.  Mild proximal bilateral lower extremity weakness left greater than right.  No sensory loss.  Coordination slow but accurate.  Gait not tested. ASSESSMENT/PLAN Michaela Rios is a 70 y.o. female with history of hypertension who presented with left-sided weakness and fever.  MRI brain revealed right-sided stroke and left-sided T2 changes.     Acute bilateral cerebral and cerebellar infarcts likely embolic in the  setting of large PFO detected on TEE vs cerebral angiogram procedure complication CT head: New multifocal hypoattenuation in the basal ganglia and thalami bilaterally  CTA head & neck: minimal atherosclerosis, no LVO or significant stenosis MRI  Numerous small acute bilateral cerebral and cerebellar infarcts compatible with emboli. 2D Echo (6/29):  EF 60-65%, moderate LVH, Grade 1 diastolic dysfunction TEE: EF 60-65%, Agitated saline contrast bubble study was positive with shunting observed within 3-6 cardiac cycles suggestive of interatrial shunt. A large patent foramen ovale is detected.  No vegetation of thrombus seen LDL 45 HgbA1c 5.9 VTE prophylaxis - lovenox Diet: regular Consider loop recorder to evaluate for atrial fibrillation No antithrombotic prior to admission, now on aspirin 81 mg daily and clopidogrel 75 mg daily.  Therapy recommendations:  rehab Disposition:  pending insurance for CIR  Hypertension Home meds:  hydralazine 29m bid, amlodipin 527mdaily, Toprol XL 10027maily, macobid 100m36mily Stable Permissive hypertension (OK if < 220/120) but gradually normalize in 5-7 days Long-term BP goal normotensive  Hyperlipidemia Home meds:  none LDL 45, goal < 70 No statin as LDL well below goal   Other Stroke Risk Factors Advanced Age >/= 65  1gaine headache: takes imitrex prn  Other Active Problems Vasculitis/right dorsal hand hematoma following cerebral angiogram via right radial artery: vascular surgery on board Incidental  finding: 4 mm right middle lobe peripheral nodular opacity, Recommend repeat CT chest 12 months. Depression/anxiety: Wellbutrin 362m daily  Hospital day # 9  Lissy Olivencia-Simmons, ACNP-BC Stroke NP 06/03/2021 4 pm I have personally obtained history,examined this patient, reviewed notes, independently viewed imaging studies, participated in medical decision making and plan of care.ROS completed by me personally and pertinent positives fully  documented  I have made any additions or clarifications directly to the above note. Agree with note above.  Patient presented a week ago with altered mental status and was found to have large right subcortical infarct of undetermined etiology.  She underwent extensive work-up including spinal tap which was negative for any infection with only mildly elevated CSF protein.  She was given empirical course of steroids.  Lab work for systemic vasculitis has been negative cerebral catheter angiogram does not show evidence of any large vessel stenosis occlusion or vasculitis.  TEE shows large PFO and repeat MRI today shows multifocal tiny punctate bilateral infarcts which are likely periprocedural complication of cerebral angiogram and clinically silent. Etiology of her previous right basal ganglia infarct from a week ago is likely cryptogenic given large PFO and negative work-up otherwise.  MRI abnormality of left basal ganglia and thalamic hyperintensity remains unexplained but appears to be improving.  CSF cytology negative for abnormal cells since lymphoma is less likely.  Long discussion with the patient and her husband and answered questions.  Recommend aspirin and Plavix for 3 weeks followed by aspirin alone and aggressive risk factor modification. She has a large PFO but ROPE score is 2 points indicating 0% chance that stroke is due to PFO.   PAntony Contras MD Medical Director MGreenfieldsPager: [phone]/05/2021 4:10 PM  To contact Stroke Continuity provider, please refer to [email or web address] After hours, contact General Neurology

## 2021-06-03 NOTE — Anesthesia Preprocedure Evaluation (Signed)
Anesthesia Evaluation  Patient identified by MRN, date of birth, ID band Patient awake    Reviewed: Allergy & Precautions, NPO status , Patient's Chart, lab work & pertinent test results  Airway Mallampati: II  TM Distance: >3 FB Neck ROM: Full    Dental no notable dental hx.    Pulmonary neg pulmonary ROS,    Pulmonary exam normal breath sounds clear to auscultation       Cardiovascular hypertension, + Peripheral Vascular Disease  Normal cardiovascular exam Rhythm:Regular Rate:Normal  Left ventricular ejection fraction, by estimation, is 60 to 65%. The  left ventricle has normal function. The left ventricle has no regional  wall motion abnormalities. There is moderate left ventricular hypertrophy.  Left ventricular diastolic  parameters are consistent with Grade I diastolic dysfunction (impaired  relaxation).  2. Right ventricular systolic function is normal. The right ventricular  size is normal. Tricuspid regurgitation signal is inadequate for assessing  PA pressure.  3. The mitral valve is normal in structure. Mild mitral valve  regurgitation. No evidence of mitral stenosis.  4. The aortic valve is grossly normal. There is mild calcification of the  aortic valve. Aortic valve regurgitation is not visualized. Mild aortic  valve sclerosis is present, with no evidence of aortic valve stenosis.  5. The inferior vena cava is normal in size with greater than 50%  respiratory variability, suggesting right atrial pressure of 3 mmHg.    Neuro/Psych negative neurological ROS  negative psych ROS   GI/Hepatic Neg liver ROS, GERD  ,  Endo/Other  negative endocrine ROS  Renal/GU negative Renal ROS  negative genitourinary   Musculoskeletal negative musculoskeletal ROS (+)   Abdominal   Peds negative pediatric ROS (+)  Hematology negative hematology ROS (+)   Anesthesia Other Findings    Reproductive/Obstetrics negative OB ROS                             Anesthesia Physical Anesthesia Plan  ASA: 3  Anesthesia Plan: MAC   Post-op Pain Management:    Induction: Intravenous  PONV Risk Score and Plan: 2 and Propofol infusion and Treatment may vary due to age or medical condition  Airway Management Planned: Simple Face Mask  Additional Equipment:   Intra-op Plan:   Post-operative Plan:   Informed Consent: I have reviewed the patients History and Physical, chart, labs and discussed the procedure including the risks, benefits and alternatives for the proposed anesthesia with the patient or authorized representative who has indicated his/her understanding and acceptance.     Dental advisory given  Plan Discussed with: CRNA and Surgeon  Anesthesia Plan Comments:         Anesthesia Quick Evaluation

## 2021-06-03 NOTE — Progress Notes (Signed)
PROGRESS NOTE    Michaela Rios  OMV:[id number] DOB: [birth year] DOA: 05/24/2021 PCP: Binnie Rail, MD    Brief Narrative:  Michaela Rios is a 70 year old female with past medical history significant for essential hypertension, prediabetes, depression, CKD stage III, history of peptic ulcer disease, chronic low back pain, migraine headache who presented to [facility] on 6/27 with generalized weakness, loss of balance.  Patient reports she was going to the bathroom lost her balance and fell.  She has been having some chills.  Patient also reports poor oral intake over the last few days with increased urinary frequency but without dysuria.  No nausea/vomiting/diarrhea, no chest pain, no palpitations, no abdominal pain.  In the ED, BP 164/116, HR 141, RR 17, temperature 101.4 F, SPO2 96% on room air.  Sodium 139, potassium 2.8, chloride 105, CO2 22, glucose 121, BUN 9, creatinine 0.77.  AST 24, ALT 16, total bilirubin 0.5.  WBC 8.6, hemoglobin 11.6, platelets 243.  Lactic acid 2.1.  Urinalysis negative.  COVID-19 PCR negative.  Influenza A/B PCR negative.  D-dimer elevated 2.51.  Chest x-ray with no acute cardiopulmonary disease process.  CT renal stone study with no acute obstructing urinary tract or ureteral calculus, no uropathy or hydronephrosis, no acute intra-abdominal or pelvic finding.  CT angiogram chest with no evidence of acute pulmonary embolism, 4 mm right middle lobe peripheral nodular opacity.   Assessment & Plan:   Principal Problem:   Sepsis ([place]) Active Problems:   Anxiety   Depression   Essential hypertension   Prediabetes   Hypokalemia   Tachycardia   Cerebral thrombosis with cerebral infarction   SIRS Fever of unknown origin Patient presenting to the ED with weakness, fever with temperature 101.4 F, tachycardia, tachypnea and mild elevation of lactic acid with associated confusion.  Chest x-ray negative for acute cardiopulmonary disease process.  Urinalysis negative  for infection.  COVID/influenza PCR negative.  Blood cultures and urine culture with no growth.  CT abdomen/pelvis with no acute intra-abdominal/pelvic process.  Underwent lumbar puncture on 6/30.  Patient was initially started on the broad-spectrum antibiotics with gentamicin, vancomycin, Flagyl, aztreonam.  Infectious disease was initially consulted, antibiotics were discontinued with negative infectious work-up.  ESR 37, CRP 6.8.  RPR negative, VR DL nonreactive.  ANA positive, double-stranded DNA negative.  ANCA negative. ACE level within normal limits. Rheumatoid factor negative.  B12 254, B1 level normal, TSH within normal limits.  Acute hepatitis panel negative.  Complement levels within normal limits. Cerebral angiogram 7/5; no angiographic evidence of vasculitis, no intracranial stenosis/vasospasm, vascular occlusion, aneurysm, AVM, dural AV fistula or other significant vascular abnormality.  CT head with new multifocal hypoattenuation basal ganglia and thalamus bilaterally of indeterminate etiology, no mass-effect to suggest tumor.  MR brain with right basal ganglia infarct, hyperdensity and mild enlargement of the left head of the caudate, left thalamus and small amount of hyperintensity in the right medial thalamus with recommendation of postcontrast MRI for further evaluation.  MRI with contrast with mild largely vascular perivascular appearing enhancement in the areas of the signal abnormality in the deep gray nuclei as well as small amount of leptomeningeal enhancement over the right and possibly left parietal convexities, no solid mass enhancing concerning for cryptococcus, vasculitis, neoplasm, angiocentric lymphoma versus granulomatosis, autoimmune or inflammatory condition.  Repeat follow-up MR brain with and without contrast 7/6 with numerous small acute bilateral cerebral and cerebellar infarcts compatible with emboli, expected Armond Hang of right basal ganglia infarct with decreased T2 signal  abnormality and enhancement in the left greater than right deep gray matter and deep white matter tracts.  --Neurology following, appreciate assistance --Completed 3-day course of Solu-Medrol 1 g IV q24h on 7/5 --Remains afebrile --CSF cytology: Negative for malignant cells --CSF oligoclonal bands: Pending --CSF paraneoplastic panel: Pending --TEE: Performed this morning, awaiting results.    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  Initially thought to be infectious, chest x-ray, CT angiogram chest, and urinalysis unrevealing.  Brain with right basal ganglia infarct and hyperintensity and mild enlargement of the left head of the caudate, left thalamus and small amount of hyperintensity right medial thalamus.  Hemoglobin A1c 5.9.  LDL 45.  TTE with LVEF 60 to 65%, no LV regional wall motion normalities, grade 1 diastolic dysfunction, mild MR, no aortic valve stenosis, IVC normal in size. --Aspirin 81 mg p.o. daily --Plavix 75 mg p.o. daily --No statin as LDL below goal <70 (45) --Continue PT/OT efforts while inpatient. --TEE results pending as above --Pending insurance authorization for CIR  Right hand hematoma Patient underwent cerebral angiogram via right radial artery catheterization on 7/5 with resultant right dorsal hand hematoma.  Left upper extremity arterial duplex negative for pseudoaneurysm.  Seen by vascular surgery with no focal hematoma to drain as this is diffuse interstitial and intramuscular edema. --Continue PT/OT efforts with hand ROM  Right middle lobe nodule Incidental finding of a 4 mm right middle lobe peripheral nodular opacity.  Recommend repeat CT chest 12 months.  Elevated D-dimer D-dimer elevated 2.51 admission, bilateral duplex ultrasound lower extremities negative for DVT, CT angiogram chest negative for PE.  Essential  hypertension BP 126/79 this morning. --Amlodipine 5 mg p.o. daily --Hydralazine 25 mg p.o. twice daily --Metoprolol succinate 100 mg p.o. daily  Depression:  --Venlafaxine 75 mg p.o. daily --Wellbutrin 300 mg p.o. daily  Anxiety: Lorazepam 1 mg twice daily as needed  Migraine headaches: Topamax 150 mg p.o. nightly, sumatriptan as needed   DVT prophylaxis: enoxaparin (LOVENOX) injection 40 mg Start: 06/02/21 1000   Code Status: Full Code Family Communication: None present at bedside this morning.  Disposition Plan:  Level of care: Telemetry Medical Status is: Inpatient  Remains inpatient appropriate because:Ongoing diagnostic testing needed not appropriate for outpatient work up, Unsafe d/c plan, IV treatments appropriate due to intensity of illness or inability to take PO, and Inpatient level of care appropriate due to severity of illness  Dispo: The patient is from: Home              Anticipated d/c is to: CIR              Patient currently is not medically stable to d/c.   Difficult to place patient No    Consultants:  Infectious disease Neurology Vascular surgery Interventional radiology Cardiology  Procedures:  Lumbar puncture 6/30 Cerebral angiogram 7/5; no angiographic evidence of vasculitis, no intracranial stenosis/vasospasm, vascular occlusion, aneurysm, AVM, dural AV fistula or other significant vascular abnormality  Antimicrobials:  Gentamicin 6/27 - 6/27 Flagyl 6/27 - 6/29 Vancomycin 6/27 - 6/29 Aztreonam 6/27 - 7/1  Subjective: Patient seen examined bedside, resting comfortably.  Just returned from TEE, results pending.  Sitting in bedside chair.  Continues to complain of hand pain from recent angiogram and decreased range of motion due to swelling.  No other questions or concerns at this time.  No family present at bedside this morning.  Patient denies  headache, no chest pain, no palpitations, no shortness of breath, no abdominal pain.  No acute events  overnight per nursing staff.  Objective: Vitals:   06/03/21 0817 06/03/21 0827 06/03/21 0837 06/03/21 0851  BP: 119/80 125/87 131/81 126/79  Pulse: 99 98 99 94  Resp: 10 20 (!) 23 (!) 22  Temp: 98.4 F (36.9 C)   98.6 F (37 C)  TempSrc: Oral   Oral  SpO2: 94% 96% 94% 96%  Weight:      Height:        Intake/Output Summary (Last 24 hours) at 06/03/2021 1120 Last data filed at 06/03/2021 0809 Gross per 24 hour  Intake 540 ml  Output --  Net 540 ml   Filed Weights   05/24/21 0956 06/03/21 0712  Weight: 60.8 kg 60.8 kg    Examination:  General exam: Appears calm and comfortable  Respiratory system: Clear to auscultation. Respiratory effort normal.  On room air Cardiovascular system: S1 & S2 heard, RRR. No JVD, murmurs, rubs, gallops or clicks. No pedal edema. Gastrointestinal system: Abdomen is nondistended, soft and nontender. No organomegaly or masses felt. Normal bowel sounds heard. Central nervous system: Alert and oriented. No focal neurological deficits.  Sensation to light touch intact. Extremities: Moves all extremities independently, muscle strength 5/5 RLE, right hand limited by pain/hematoma, LUE 4/5 and LLE 4+/5.  Skin: Right hand swelling, otherwise no rashes, lesions or ulcers Psychiatry: Judgement and insight appear normal. Mood & affect appropriate.     Data Reviewed: I have personally reviewed following labs and imaging studies  CBC: Recent Labs  Lab 05/28/21 0211 05/30/21 1334 06/02/21 0826  WBC 6.6 6.1 10.0  HGB 10.3* 10.4* 11.5*  HCT 30.6* 31.6* 34.8*  MCV 89.0 89.5 89.5  PLT 231 284 660   Basic Metabolic Panel: Recent Labs  Lab 05/28/21 0211 05/30/21 1334 06/01/21 0203 06/02/21 0826  NA 134* 135 137 140  K 3.3* 3.9 3.5 3.7  CL 107 109 109 111  CO2 20* 21* 20* 20*  GLUCOSE 107* 104* 146* 123*  BUN $Re'9 11 18 15  'VRm$ CREATININE 0.64 0.66 0.58 0.66  CALCIUM 8.2* 8.7* 8.9 9.1   GFR: Estimated Creatinine Clearance: 54.1 mL/min (by C-G formula  based on SCr of 0.66 mg/dL). Liver Function Tests: No results for input(s): AST, ALT, ALKPHOS, BILITOT, PROT, ALBUMIN in the last 168 hours. No results for input(s): LIPASE, AMYLASE in the last 168 hours. No results for input(s): AMMONIA in the last 168 hours. Coagulation Profile: No results for input(s): INR, PROTIME in the last 168 hours. Cardiac Enzymes: No results for input(s): CKTOTAL, CKMB, CKMBINDEX, TROPONINI in the last 168 hours. BNP (last 3 results) No results for input(s): PROBNP in the last 8760 hours. HbA1C: No results for input(s): HGBA1C in the last 72 hours. CBG: Recent Labs  Lab 05/31/21 2213 06/02/21 1350  GLUCAP 137* 169*   Lipid Profile: No results for input(s): CHOL, HDL, LDLCALC, TRIG, CHOLHDL, LDLDIRECT in the last 72 hours. Thyroid Function Tests: No results for input(s): TSH, T4TOTAL, FREET4, T3FREE, THYROIDAB in the last 72 hours. Anemia Panel: No results for input(s): VITAMINB12, FOLATE, FERRITIN, TIBC, IRON, RETICCTPCT in the last 72 hours. Sepsis Labs: No results for input(s): PROCALCITON, LATICACIDVEN in the last 168 hours.  Recent Results (from the past 240 hour(s))  Blood culture (routine x 2)     Status: None   Collection Time: 05/24/21 12:03 PM   Specimen: BLOOD  Result Value Ref Range Status   Specimen Description  BLOOD LEFT ANTECUBITAL  Final   Special Requests   Final    BOTTLES DRAWN AEROBIC AND ANAEROBIC Blood Culture adequate volume   Culture   Final    NO GROWTH 5 DAYS Performed at [facility], [address] [place], [place] [address]    Report Status 05/29/2021 FINAL  Final  Urine culture     Status: None   Collection Time: 05/24/21 12:48 PM   Specimen: Urine, Clean Catch  Result Value Ref Range Status   Specimen Description URINE, CLEAN CATCH  Final   Special Requests NONE  Final   Culture   Final    NO GROWTH Performed at [facility], [place] [address], [place], [place] [address]    Report Status 05/25/2021  FINAL  Final  CSF culture w Gram Stain     Status: None   Collection Time: 05/27/21  7:14 PM   Specimen: CSF; Cerebrospinal Fluid  Result Value Ref Range Status   Specimen Description CSF  Final   Special Requests NONE  Final   Gram Stain   Final    WBC PRESENT, PREDOMINANTLY MONONUCLEAR NO ORGANISMS SEEN CYTOSPIN SMEAR    Culture   Final    NO GROWTH 3 DAYS Performed at [facility], [place] [address] [place], [place] [address]    Report Status 05/31/2021 FINAL  Final  Culture, blood (routine x 2)     Status: None (Preliminary result)   Collection Time: 05/30/21  4:11 PM   Specimen: BLOOD RIGHT HAND  Result Value Ref Range Status   Specimen Description BLOOD RIGHT HAND  Final   Special Requests   Final    BOTTLES DRAWN AEROBIC AND ANAEROBIC Blood Culture results may not be optimal due to an inadequate volume of blood received in culture bottles   Culture   Final    NO GROWTH 4 DAYS Performed at [facility], [place] [address], [place], [place] [address]    Report Status PENDING  Incomplete  Culture, blood (routine x 2)     Status: None (Preliminary result)   Collection Time: 05/30/21  4:11 PM   Specimen: BLOOD RIGHT HAND  Result Value Ref Range Status   Specimen Description BLOOD RIGHT HAND  Final   Special Requests   Final    BOTTLES DRAWN AEROBIC ONLY Blood Culture results may not be optimal due to an inadequate volume of blood received in culture bottles   Culture   Final    NO GROWTH 4 DAYS Performed at [facility], [place] [address] [place], [place] [address]    Report Status PENDING  Incomplete         Radiology Studies: MR BRAIN W WO CONTRAST  Result Date: 06/02/2021 CLINICAL DATA:  Stroke follow-up. Negative diagnostic cerebral angiogram yesterday. EXAM: MRI HEAD WITHOUT AND WITH CONTRAST TECHNIQUE: Multiplanar, multiecho pulse sequences of the brain and surrounding structures were obtained without and with intravenous contrast. CONTRAST:  57mL  GADAVIST GADOBUTROL 1 MMOL/ML IV SOLN COMPARISON:  05/26/2021 FINDINGS: Brain: There are multiple subcentimeter acute infarcts scattered throughout both cerebral hemispheres primarily involving cortex and subcortical white matter of all lobes. Small acute infarcts are also present in the left caudate head, both thalami, and right and possibly left cerebellar hemispheres. Restricted diffusion and T2 hyperintensity in the right basal ganglia have mildly decreased. T2 hyperintensity involving the left basal ganglia, left greater than right thalami, and left greater than right internal and external  capsules has also decreased with decreased enlargement/swelling of the left caudate and left thalamus. There is some intrinsic T1 hyperintensity associated with the right basal ganglia infarct as well as some true, mild hazy as well as vascular appearing enhancement. The small amount of scattered enhancement within the left basal ganglia and left thalamus on the prior MRI has decreased. There may be a small amount of residual leptomeningeal enhancement within biparietal sulci (lack of sagittal T1 postcontrast imaging on today's study limits direct comparison). There is also minimal persistent smooth pachymeningeal enhancement over both cerebral convexities. There is mild generalized cerebral atrophy. No intracranial hemorrhage, midline shift, or extra-axial fluid collection is evident. Vascular: Major intracranial vascular flow voids are preserved. Skull and upper cervical spine: Unremarkable bone marrow signal. Sinuses/Orbits: Unremarkable orbits. Paranasal sinuses and mastoid air cells are clear. Other: None. IMPRESSION: 1. 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 2. Expected evolution of right basal ganglia infarct. 3. Decreased T2 signal abnormality and enhancement in the left greater than right deep gray nuclei and deep white matter tracts with differential considerations as  previously outlined. Electronically Signed   By: Logan Bores M.D.   On: 06/02/2021 15:56   IR US Guide Vasc Access Right  Result Date: 06/01/2021 INDICATION: 70 year old female with past medical history significant for hypertension presenting with left-sided weakness and fever. 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 Given the possibility of CNS vasculitis, a diagnostic cerebral angiogram was requested. EXAM: ULTRASOUND-GUIDED VASCULAR ACCESS DIAGNOSTIC CEREBRAL ANGIOGRAM COMPARISON:  CT/CT angiogram of the head and neck May 26, 2021 MEDICATIONS: 5,000 IU heparin, 5 mg Verapamil and 200 mcg nitroglycerin ANESTHESIA/SEDATION: Versed 1 mg IV; Fentanyl 25 mcg IV Moderate Sedation Time:  46 minutes The patient was continuously monitored during the procedure by the interventional radiology nurse under my direct supervision. CONTRAST:  78 mL of Omnipaque 300 milligrams/mL FLUOROSCOPY TIME:  Fluoroscopy Time: 11 minutes 6 seconds (331 mGy). COMPLICATIONS: None immediate. TECHNIQUE: Informed written consent was obtained from the patient after a thorough discussion of the procedural risks, benefits and alternatives. All questions were addressed. Maximal Sterile Barrier Technique was utilized including caps, mask, sterile gowns, sterile gloves, sterile drape, hand hygiene and skin antiseptic. A timeout was performed prior to the initiation of the procedure. Using the modified Seldinger technique and a micropuncture kit, access was gained to the distal right radial artery at the anatomical snuffbox and a 5 French sheath was placed. Slow intra arterial infusion of 5,000 IU heparin, 5 mg Verapamil and 200 mcg nitroglycerin diluted in patient's own blood was performed. No significant fluctuation in patient's blood pressure seen. Then, a right radial artery roadmap was obtained via sheath side port. Normal brachial artery branching pattern  seen. No significant anatomical variation. The right radial artery caliber is adequate for vascular access. Next, a 5 [place] Simmons 2 glide catheter was navigated over a 0.035" Terumo Glidewire into the right subclavian artery under fluoroscopic guidance. Under fluoroscopy the catheter was placed into the right vertebral artery. Frontal, lateral and magnified bilateral oblique views of the head were obtained. The catheter was then placed into the right common carotid artery. Frontal and lateral views of the neck were obtained. Under biplane roadmap, the catheter was placed into the right internal carotid artery. Frontal, lateral and magnified oblique views of the head were obtained. The catheter was subsequently placed into the right external carotid artery. Frontal  and lateral angiograms of the head were obtained. Next, the catheter was placed into the left common carotid artery. Frontal and lateral views of the neck were obtained followed by frontal, lateral and magnified oblique views of the head. The catheter was subsequently withdrawn. An inflatable band was placed and inflated over the right hand access site. The vascular sheath was withdrawn and the band was slowly deflated until brisk flow was noted through the arteriotomy site. At this point, the band was reinflated with additional 2 cc of air to obtain patent hemostasis. FINDINGS: Right radial artery ultrasound and right radial artery angiogram: The caliber of the distal right radial artery is appropriate for angiogram access. The right radial artery and the right ulnar artery have normal course and caliber. No significant anatomical variants noted. Right vertebral artery angiograms: The right vertebral artery, basilar artery, and bilateral posterior cerebral arteries are unremarkable. The left vertebral artery seen by contrast reflux and also appear unremarkable. Luminal caliber is smooth and tapering. No aneurysms or abnormally high-flow, early draining  veins are seen. No regions of abnormal hypervascularity are noted. The visualized dural sinuses are patent. Right CCA angiograms: Cervical angiograms show normal course and caliber of the visualized right common carotid and internal carotid arteries. There are no significant stenoses. Right ICA angiograms: There is brisk vascular contrast filling of the ACA and MCA vascular trees. Luminal caliber is smooth and tapering. No aneurysms or abnormally high-flow, early draining veins are seen. No regions of abnormal hypervascularity are noted. The visualized dural sinuses are patent. Right ECA and right occipital angiograms: No early venous drainage was noted. The visualized branches of the right external carotid artery are unremarkable. Left CCA angiograms: Cervical angiograms show normal caliber of the visualized left common carotid and internal carotid arteries. Increased tortuosity of the mid cervical segment of the left ICA with mild luminal irregularity. There are no significant stenoses. Left CCA angiograms - cranial views: There is brisk vascular contrast filling of the ACA and MCA vascular trees. Luminal caliber is smooth and tapering. No aneurysms or abnormally high-flow, early draining veins are seen. No regions of abnormal hypervascularity are noted. The visualized dural sinuses are patent. The visualized branches of the left external carotid artery are unremarkable. PROCEDURE: No intervention performed. IMPRESSION: 1. No angiographic evidence of vasculitis. 2. No significant intracranial stenosis, vasospasm, vascular occlusion, aneurysm, AVM, dural AV fistula or other significant vascular abnormality. PLAN: Results communicated to the neurology team. Electronically Signed   By: Pedro Earls M.D.   On: 06/01/2021 17:01   VAS [place] UPPER EXTREMITY ARTERIAL DUPLEX  Result Date: 06/02/2021  UPPER EXTREMITY DUPLEX STUDY Patient Name:  MIKISHA ROSELAND [facility]  Date of Exam:   06/01/2021 Medical Rec #:  [id number]          Accession #:    [id number] Date of Birth: 08-28-1951          Patient Gender: F Patient Age:   69Y Exam Location:  [facility] Procedure:      VAS [place] UPPER EXTREMITY ARTERIAL DUPLEX Referring Phys: [place] --------------------------------------------------------------------------------  Indications: Bruising and Pain. History:     Patient has a history of catheterization via left radial artery.  Risk Factors: Hypertension. Limitations: TR band in place on the right wrist. Comparison Study: No prior studies. Performing Technologist: Oliver Hum RVT  Examination Guidelines: A complete evaluation includes B-mode imaging, spectral Doppler, color Doppler, and power Doppler as needed of all accessible portions of each  vessel. Bilateral testing is considered an integral part of a complete examination. Limited examinations for reoccurring indications may be performed as noted.  Right Doppler Findings: +---------------+----------+---------+--------+--------+ Site           PSV (cm/s)Waveform StenosisComments +---------------+----------+---------+--------+--------+ Subclavian Dist          triphasic                 +---------------+----------+---------+--------+--------+ Brachial Dist            triphasic                 +---------------+----------+---------+--------+--------+ Radial Dist              triphasic                 +---------------+----------+---------+--------+--------+ Ulnar Dist               triphasic                 +---------------+----------+---------+--------+--------+ Palmar Arch              triphasic                 +---------------+----------+---------+--------+--------+ There is no evidence of a pseudoaneurysm. There is evidence of a channel leading off of the distal radial artery. The channel is located beneath the TR band.   Electronically signed by Jamelle Haring on 06/02/2021 at 5:21:27 PM.    Final    IR ANGIO INTRA  EXTRACRAN SEL COM CAROTID INNOMINATE UNI L MOD SED  Result Date: 06/01/2021 INDICATION: 70 year old female with past medical history significant for hypertension presenting with left-sided weakness and fever. 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 Given the possibility of CNS vasculitis, a diagnostic cerebral angiogram was requested. EXAM: ULTRASOUND-GUIDED VASCULAR ACCESS DIAGNOSTIC CEREBRAL ANGIOGRAM COMPARISON:  CT/CT angiogram of the head and neck May 26, 2021 MEDICATIONS: 5,000 IU heparin, 5 mg Verapamil and 200 mcg nitroglycerin ANESTHESIA/SEDATION: Versed 1 mg IV; Fentanyl 25 mcg IV Moderate Sedation Time:  46 minutes The patient was continuously monitored during the procedure by the interventional radiology nurse under my direct supervision. CONTRAST:  78 mL of Omnipaque 300 milligrams/mL FLUOROSCOPY TIME:  Fluoroscopy Time: 11 minutes 6 seconds (331 mGy). COMPLICATIONS: None immediate. TECHNIQUE: Informed written consent was obtained from the patient after a thorough discussion of the procedural risks, benefits and alternatives. All questions were addressed. Maximal Sterile Barrier Technique was utilized including caps, mask, sterile gowns, sterile gloves, sterile drape, hand hygiene and skin antiseptic. A timeout was performed prior to the initiation of the procedure. Using the modified Seldinger technique and a micropuncture kit, access was gained to the distal right radial artery at the anatomical snuffbox and a 5 French sheath was placed. Slow intra arterial infusion of 5,000 IU heparin, 5 mg Verapamil and 200 mcg nitroglycerin diluted in patient's own blood was performed. No significant fluctuation in patient's blood pressure seen. Then, a right radial artery roadmap was obtained via sheath side port. Normal brachial artery branching pattern seen. No significant anatomical variation. The right radial artery caliber  is adequate for vascular access. Next, a 5 [place] Simmons 2 glide catheter was navigated over a 0.035" Terumo Glidewire into the right subclavian artery under fluoroscopic guidance. Under fluoroscopy the catheter was placed into the right vertebral artery. Frontal, lateral and magnified bilateral oblique views of the head were obtained. The catheter was then placed into the right common  carotid artery. Frontal and lateral views of the neck were obtained. Under biplane roadmap, the catheter was placed into the right internal carotid artery. Frontal, lateral and magnified oblique views of the head were obtained. The catheter was subsequently placed into the right external carotid artery. Frontal and lateral angiograms of the head were obtained. Next, the catheter was placed into the left common carotid artery. Frontal and lateral views of the neck were obtained followed by frontal, lateral and magnified oblique views of the head. The catheter was subsequently withdrawn. An inflatable band was placed and inflated over the right hand access site. The vascular sheath was withdrawn and the band was slowly deflated until brisk flow was noted through the arteriotomy site. At this point, the band was reinflated with additional 2 cc of air to obtain patent hemostasis. FINDINGS: Right radial artery ultrasound and right radial artery angiogram: The caliber of the distal right radial artery is appropriate for angiogram access. The right radial artery and the right ulnar artery have normal course and caliber. No significant anatomical variants noted. Right vertebral artery angiograms: The right vertebral artery, basilar artery, and bilateral posterior cerebral arteries are unremarkable. The left vertebral artery seen by contrast reflux and also appear unremarkable. Luminal caliber is smooth and tapering. No aneurysms or abnormally high-flow, early draining veins are seen. No regions of abnormal hypervascularity are noted. The  visualized dural sinuses are patent. Right CCA angiograms: Cervical angiograms show normal course and caliber of the visualized right common carotid and internal carotid arteries. There are no significant stenoses. Right ICA angiograms: There is brisk vascular contrast filling of the ACA and MCA vascular trees. Luminal caliber is smooth and tapering. No aneurysms or abnormally high-flow, early draining veins are seen. No regions of abnormal hypervascularity are noted. The visualized dural sinuses are patent. Right ECA and right occipital angiograms: No early venous drainage was noted. The visualized branches of the right external carotid artery are unremarkable. Left CCA angiograms: Cervical angiograms show normal caliber of the visualized left common carotid and internal carotid arteries. Increased tortuosity of the mid cervical segment of the left ICA with mild luminal irregularity. There are no significant stenoses. Left CCA angiograms - cranial views: There is brisk vascular contrast filling of the ACA and MCA vascular trees. Luminal caliber is smooth and tapering. No aneurysms or abnormally high-flow, early draining veins are seen. No regions of abnormal hypervascularity are noted. The visualized dural sinuses are patent. The visualized branches of the left external carotid artery are unremarkable. PROCEDURE: No intervention performed. IMPRESSION: 1. No angiographic evidence of vasculitis. 2. No significant intracranial stenosis, vasospasm, vascular occlusion, aneurysm, AVM, dural AV fistula or other significant vascular abnormality. PLAN: Results communicated to the neurology team. Electronically Signed   By: Pedro Earls M.D.   On: 06/01/2021 17:01   IR ANGIO INTRA EXTRACRAN SEL INTERNAL CAROTID UNI R MOD SED  Result Date: 06/01/2021 INDICATION: 70 year old female with past medical history significant for hypertension presenting with left-sided weakness and fever. 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 Given the possibility of CNS vasculitis, a diagnostic cerebral angiogram was requested. EXAM: ULTRASOUND-GUIDED VASCULAR ACCESS DIAGNOSTIC CEREBRAL ANGIOGRAM COMPARISON:  CT/CT angiogram of the head and neck May 26, 2021 MEDICATIONS: 5,000 IU heparin, 5 mg Verapamil and 200 mcg nitroglycerin ANESTHESIA/SEDATION: Versed 1 mg IV; Fentanyl 25 mcg IV Moderate Sedation Time:  46 minutes The patient was continuously monitored  during the procedure by the interventional radiology nurse under my direct supervision. CONTRAST:  78 mL of Omnipaque 300 milligrams/mL FLUOROSCOPY TIME:  Fluoroscopy Time: 11 minutes 6 seconds (331 mGy). COMPLICATIONS: None immediate. TECHNIQUE: Informed written consent was obtained from the patient after a thorough discussion of the procedural risks, benefits and alternatives. All questions were addressed. Maximal Sterile Barrier Technique was utilized including caps, mask, sterile gowns, sterile gloves, sterile drape, hand hygiene and skin antiseptic. A timeout was performed prior to the initiation of the procedure. Using the modified Seldinger technique and a micropuncture kit, access was gained to the distal right radial artery at the anatomical snuffbox and a 5 French sheath was placed. Slow intra arterial infusion of 5,000 IU heparin, 5 mg Verapamil and 200 mcg nitroglycerin diluted in patient's own blood was performed. No significant fluctuation in patient's blood pressure seen. Then, a right radial artery roadmap was obtained via sheath side port. Normal brachial artery branching pattern seen. No significant anatomical variation. The right radial artery caliber is adequate for vascular access. Next, a 5 [place] Simmons 2 glide catheter was navigated over a 0.035" Terumo Glidewire into the right subclavian artery under fluoroscopic guidance. Under fluoroscopy the catheter was placed into the  right vertebral artery. Frontal, lateral and magnified bilateral oblique views of the head were obtained. The catheter was then placed into the right common carotid artery. Frontal and lateral views of the neck were obtained. Under biplane roadmap, the catheter was placed into the right internal carotid artery. Frontal, lateral and magnified oblique views of the head were obtained. The catheter was subsequently placed into the right external carotid artery. Frontal and lateral angiograms of the head were obtained. Next, the catheter was placed into the left common carotid artery. Frontal and lateral views of the neck were obtained followed by frontal, lateral and magnified oblique views of the head. The catheter was subsequently withdrawn. An inflatable band was placed and inflated over the right hand access site. The vascular sheath was withdrawn and the band was slowly deflated until brisk flow was noted through the arteriotomy site. At this point, the band was reinflated with additional 2 cc of air to obtain patent hemostasis. FINDINGS: Right radial artery ultrasound and right radial artery angiogram: The caliber of the distal right radial artery is appropriate for angiogram access. The right radial artery and the right ulnar artery have normal course and caliber. No significant anatomical variants noted. Right vertebral artery angiograms: The right vertebral artery, basilar artery, and bilateral posterior cerebral arteries are unremarkable. The left vertebral artery seen by contrast reflux and also appear unremarkable. Luminal caliber is smooth and tapering. No aneurysms or abnormally high-flow, early draining veins are seen. No regions of abnormal hypervascularity are noted. The visualized dural sinuses are patent. Right CCA angiograms: Cervical angiograms show normal course and caliber of the visualized right common carotid and internal carotid arteries. There are no significant stenoses. Right ICA angiograms:  There is brisk vascular contrast filling of the ACA and MCA vascular trees. Luminal caliber is smooth and tapering. No aneurysms or abnormally high-flow, early draining veins are seen. No regions of abnormal hypervascularity are noted. The visualized dural sinuses are patent. Right ECA and right occipital angiograms: No early venous drainage was noted. The visualized branches of the right external carotid artery are unremarkable. Left CCA angiograms: Cervical angiograms show normal caliber of the visualized left common carotid and internal carotid arteries. Increased tortuosity of the mid cervical segment of the left ICA  with mild luminal irregularity. There are no significant stenoses. Left CCA angiograms - cranial views: There is brisk vascular contrast filling of the ACA and MCA vascular trees. Luminal caliber is smooth and tapering. No aneurysms or abnormally high-flow, early draining veins are seen. No regions of abnormal hypervascularity are noted. The visualized dural sinuses are patent. The visualized branches of the left external carotid artery are unremarkable. PROCEDURE: No intervention performed. IMPRESSION: 1. No angiographic evidence of vasculitis. 2. No significant intracranial stenosis, vasospasm, vascular occlusion, aneurysm, AVM, dural AV fistula or other significant vascular abnormality. PLAN: Results communicated to the neurology team. Electronically Signed   By: Pedro Earls M.D.   On: 06/01/2021 17:01   IR ANGIO VERTEBRAL SEL VERTEBRAL UNI R MOD SED  Result Date: 06/01/2021 INDICATION: 70 year old female with past medical history significant for hypertension presenting with left-sided weakness and fever. 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 Given the possibility of CNS vasculitis, a diagnostic cerebral angiogram was requested. EXAM: ULTRASOUND-GUIDED VASCULAR ACCESS DIAGNOSTIC CEREBRAL  ANGIOGRAM COMPARISON:  CT/CT angiogram of the head and neck May 26, 2021 MEDICATIONS: 5,000 IU heparin, 5 mg Verapamil and 200 mcg nitroglycerin ANESTHESIA/SEDATION: Versed 1 mg IV; Fentanyl 25 mcg IV Moderate Sedation Time:  46 minutes The patient was continuously monitored during the procedure by the interventional radiology nurse under my direct supervision. CONTRAST:  78 mL of Omnipaque 300 milligrams/mL FLUOROSCOPY TIME:  Fluoroscopy Time: 11 minutes 6 seconds (331 mGy). COMPLICATIONS: None immediate. TECHNIQUE: Informed written consent was obtained from the patient after a thorough discussion of the procedural risks, benefits and alternatives. All questions were addressed. Maximal Sterile Barrier Technique was utilized including caps, mask, sterile gowns, sterile gloves, sterile drape, hand hygiene and skin antiseptic. A timeout was performed prior to the initiation of the procedure. Using the modified Seldinger technique and a micropuncture kit, access was gained to the distal right radial artery at the anatomical snuffbox and a 5 French sheath was placed. Slow intra arterial infusion of 5,000 IU heparin, 5 mg Verapamil and 200 mcg nitroglycerin diluted in patient's own blood was performed. No significant fluctuation in patient's blood pressure seen. Then, a right radial artery roadmap was obtained via sheath side port. Normal brachial artery branching pattern seen. No significant anatomical variation. The right radial artery caliber is adequate for vascular access. Next, a 5 [place] Simmons 2 glide catheter was navigated over a 0.035" Terumo Glidewire into the right subclavian artery under fluoroscopic guidance. Under fluoroscopy the catheter was placed into the right vertebral artery. Frontal, lateral and magnified bilateral oblique views of the head were obtained. The catheter was then placed into the right common carotid artery. Frontal and lateral views of the neck were obtained. Under biplane roadmap,  the catheter was placed into the right internal carotid artery. Frontal, lateral and magnified oblique views of the head were obtained. The catheter was subsequently placed into the right external carotid artery. Frontal and lateral angiograms of the head were obtained. Next, the catheter was placed into the left common carotid artery. Frontal and lateral views of the neck were obtained followed by frontal, lateral and magnified oblique views of the head. The catheter was subsequently withdrawn. An inflatable band was placed and inflated over the right hand access site. The vascular sheath was withdrawn and the band was slowly deflated until brisk flow was noted through the arteriotomy site. At this point, the band was  reinflated with additional 2 cc of air to obtain patent hemostasis. FINDINGS: Right radial artery ultrasound and right radial artery angiogram: The caliber of the distal right radial artery is appropriate for angiogram access. The right radial artery and the right ulnar artery have normal course and caliber. No significant anatomical variants noted. Right vertebral artery angiograms: The right vertebral artery, basilar artery, and bilateral posterior cerebral arteries are unremarkable. The left vertebral artery seen by contrast reflux and also appear unremarkable. Luminal caliber is smooth and tapering. No aneurysms or abnormally high-flow, early draining veins are seen. No regions of abnormal hypervascularity are noted. The visualized dural sinuses are patent. Right CCA angiograms: Cervical angiograms show normal course and caliber of the visualized right common carotid and internal carotid arteries. There are no significant stenoses. Right ICA angiograms: There is brisk vascular contrast filling of the ACA and MCA vascular trees. Luminal caliber is smooth and tapering. No aneurysms or abnormally high-flow, early draining veins are seen. No regions of abnormal hypervascularity are noted. The  visualized dural sinuses are patent. Right ECA and right occipital angiograms: No early venous drainage was noted. The visualized branches of the right external carotid artery are unremarkable. Left CCA angiograms: Cervical angiograms show normal caliber of the visualized left common carotid and internal carotid arteries. Increased tortuosity of the mid cervical segment of the left ICA with mild luminal irregularity. There are no significant stenoses. Left CCA angiograms - cranial views: There is brisk vascular contrast filling of the ACA and MCA vascular trees. Luminal caliber is smooth and tapering. No aneurysms or abnormally high-flow, early draining veins are seen. No regions of abnormal hypervascularity are noted. The visualized dural sinuses are patent. The visualized branches of the left external carotid artery are unremarkable. PROCEDURE: No intervention performed. IMPRESSION: 1. No angiographic evidence of vasculitis. 2. No significant intracranial stenosis, vasospasm, vascular occlusion, aneurysm, AVM, dural AV fistula or other significant vascular abnormality. PLAN: Results communicated to the neurology team. Electronically Signed   By: Pedro Earls M.D.   On: 06/01/2021 17:01   IR ANGIO EXTERNAL CAROTID SEL EXT CAROTID UNI R MOD SED  Result Date: 06/01/2021 INDICATION: 70 year old female with past medical history significant for hypertension presenting with left-sided weakness and fever. 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 Given the possibility of CNS vasculitis, a diagnostic cerebral angiogram was requested. EXAM: ULTRASOUND-GUIDED VASCULAR ACCESS DIAGNOSTIC CEREBRAL ANGIOGRAM COMPARISON:  CT/CT angiogram of the head and neck May 26, 2021 MEDICATIONS: 5,000 IU heparin, 5 mg Verapamil and 200 mcg nitroglycerin ANESTHESIA/SEDATION: Versed 1 mg IV; Fentanyl 25 mcg IV Moderate Sedation Time:  46  minutes The patient was continuously monitored during the procedure by the interventional radiology nurse under my direct supervision. CONTRAST:  78 mL of Omnipaque 300 milligrams/mL FLUOROSCOPY TIME:  Fluoroscopy Time: 11 minutes 6 seconds (331 mGy). COMPLICATIONS: None immediate. TECHNIQUE: Informed written consent was obtained from the patient after a thorough discussion of the procedural risks, benefits and alternatives. All questions were addressed. Maximal Sterile Barrier Technique was utilized including caps, mask, sterile gowns, sterile gloves, sterile drape, hand hygiene and skin antiseptic. A timeout was performed prior to the initiation of the procedure. Using the modified Seldinger technique and a micropuncture kit, access was gained to the distal right radial artery at the anatomical snuffbox and a 5 French sheath was placed. Slow intra arterial infusion of 5,000 IU heparin, 5 mg Verapamil and  200 mcg nitroglycerin diluted in patient's own blood was performed. No significant fluctuation in patient's blood pressure seen. Then, a right radial artery roadmap was obtained via sheath side port. Normal brachial artery branching pattern seen. No significant anatomical variation. The right radial artery caliber is adequate for vascular access. Next, a 5 [place] Simmons 2 glide catheter was navigated over a 0.035" Terumo Glidewire into the right subclavian artery under fluoroscopic guidance. Under fluoroscopy the catheter was placed into the right vertebral artery. Frontal, lateral and magnified bilateral oblique views of the head were obtained. The catheter was then placed into the right common carotid artery. Frontal and lateral views of the neck were obtained. Under biplane roadmap, the catheter was placed into the right internal carotid artery. Frontal, lateral and magnified oblique views of the head were obtained. The catheter was subsequently placed into the right external carotid artery. Frontal and  lateral angiograms of the head were obtained. Next, the catheter was placed into the left common carotid artery. Frontal and lateral views of the neck were obtained followed by frontal, lateral and magnified oblique views of the head. The catheter was subsequently withdrawn. An inflatable band was placed and inflated over the right hand access site. The vascular sheath was withdrawn and the band was slowly deflated until brisk flow was noted through the arteriotomy site. At this point, the band was reinflated with additional 2 cc of air to obtain patent hemostasis. FINDINGS: Right radial artery ultrasound and right radial artery angiogram: The caliber of the distal right radial artery is appropriate for angiogram access. The right radial artery and the right ulnar artery have normal course and caliber. No significant anatomical variants noted. Right vertebral artery angiograms: The right vertebral artery, basilar artery, and bilateral posterior cerebral arteries are unremarkable. The left vertebral artery seen by contrast reflux and also appear unremarkable. Luminal caliber is smooth and tapering. No aneurysms or abnormally high-flow, early draining veins are seen. No regions of abnormal hypervascularity are noted. The visualized dural sinuses are patent. Right CCA angiograms: Cervical angiograms show normal course and caliber of the visualized right common carotid and internal carotid arteries. There are no significant stenoses. Right ICA angiograms: There is brisk vascular contrast filling of the ACA and MCA vascular trees. Luminal caliber is smooth and tapering. No aneurysms or abnormally high-flow, early draining veins are seen. No regions of abnormal hypervascularity are noted. The visualized dural sinuses are patent. Right ECA and right occipital angiograms: No early venous drainage was noted. The visualized branches of the right external carotid artery are unremarkable. Left CCA angiograms: Cervical  angiograms show normal caliber of the visualized left common carotid and internal carotid arteries. Increased tortuosity of the mid cervical segment of the left ICA with mild luminal irregularity. There are no significant stenoses. Left CCA angiograms - cranial views: There is brisk vascular contrast filling of the ACA and MCA vascular trees. Luminal caliber is smooth and tapering. No aneurysms or abnormally high-flow, early draining veins are seen. No regions of abnormal hypervascularity are noted. The visualized dural sinuses are patent. The visualized branches of the left external carotid artery are unremarkable. PROCEDURE: No intervention performed. IMPRESSION: 1. No angiographic evidence of vasculitis. 2. No significant intracranial stenosis, vasospasm, vascular occlusion, aneurysm, AVM, dural AV fistula or other significant vascular abnormality. PLAN: Results communicated to the neurology team. Electronically Signed   By: Pedro Earls M.D.   On: 06/01/2021 17:01        Scheduled Meds:  amLODipine  5 mg Oral q AM   aspirin EC  81 mg Oral Daily   buPROPion  300 mg Oral Daily   calcium carbonate  1 tablet Oral Q breakfast   cholecalciferol  1,000 Units Oral Daily   clopidogrel  75 mg Oral Daily   enoxaparin (LOVENOX) injection  40 mg Subcutaneous Q24H   feeding supplement  237 mL Oral BID BM   hydrALAZINE  25 mg Oral BID   metoprolol succinate  100 mg Oral Daily   multivitamin with minerals  1 tablet Oral Daily   pantoprazole  40 mg Oral Daily   topiramate  150 mg Oral QHS   venlafaxine XR  75 mg Oral Q breakfast   vitamin B-12  1,000 mcg Oral Daily   Continuous Infusions:   LOS: 9 days    Time spent: 39 minutes spent on chart review, discussion with nursing staff, consultants, updating family and interview/physical exam; more than 50% of that time was spent in counseling and/or coordination of care.    Valeria Krisko J [place] DO [facility] Available via Epic  secure chat 7am-7pm After these hours, please refer to coverage provider listed on [email or web address] 06/03/2021, 11:20 AM

## 2021-06-03 NOTE — Progress Notes (Signed)
  Echocardiogram Echocardiogram Transesophageal has been performed.  Michaela Rios 06/03/2021, 8:29 AM

## 2021-06-03 NOTE — Transfer of Care (Signed)
Immediate Anesthesia Transfer of Care Note  Patient: Michaela Rios  Procedure(s) Performed: TRANSESOPHAGEAL ECHOCARDIOGRAM (TEE) BUBBLE STUDY  Patient Location: Endoscopy Unit    Anesthesia Type:MAC  Level of Consciousness: awake, alert  and oriented  Airway & Oxygen Therapy: Patient Spontanous Breathing  Post-op Assessment: Report given to RN and Post -op Vital signs reviewed and stable  Post vital signs: Reviewed and stable  Last Vitals:  Vitals Value Taken Time  BP 119/80 06/03/21 0817  Temp 36.9 C 06/03/21 0817  Pulse 102 06/03/21 0825  Resp 18 06/03/21 0825  SpO2 95 % 06/03/21 0825  Vitals shown include unvalidated device data.  Last Pain:  Vitals:   06/03/21 0817  TempSrc: Oral  PainSc: 0-No pain      Patients Stated Pain Goal: 3 ([id number])  Complications: No notable events documented.

## 2021-06-03 NOTE — Progress Notes (Signed)
Occupational Therapy Treatment Patient Details Name: Michaela Rios MRN: [id number] DOB: 12-25-1950 Today's Date: 06/03/2021    History of present illness Pt is a 70 y.o. female admitted 05/24/21 with c/o weakness, fall due to loss of balance, poor PO intake. Workup for sepsis, unclear etiology. Pt with confusion, neuro consult for possible CNS infection. Brain MRI 6/29 showed R basal ganglia stroke in addition to accelerated small vessel disease. 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 PMH includes HTN, CKD 3, migraines, chronic LBP, prediabetes, anxiety, depression.   OT comments  Pt very limited by lethargy this session. She presented with heavy posterior lean in both sitting unsupported and standing, requiring min-mod A for all ADL's and functional mobility this session. Acute OT will continue to follow to assist with ADL performance and functional mobility.    Follow Up Recommendations  CIR    Equipment Recommendations  Other (comment) (RW)    Recommendations for Other Services Rehab consult    Precautions / Restrictions Precautions Precautions: Fall Precaution Comments: urinary urgency/incontinence (has Depends in room) Restrictions Weight Bearing Restrictions: No       Mobility Bed Mobility Overal bed mobility: Needs Assistance Bed Mobility: Rolling;Sidelying to Sit Rolling: Min assist Sidelying to sit: Mod assist       General bed mobility comments: Up inrecliner on entry    Transfers Overall transfer level: Needs assistance Equipment used: 1 person hand held assist Transfers: Sit to/from Stand Sit to Stand: Min assist         General transfer comment: Focusing on leaning forward to correct posterior lean    Balance Overall balance assessment: Needs assistance Sitting-balance support: Single extremity supported;Feet supported Sitting balance-Leahy Scale: Poor Sitting balance - Comments: heavy posterior  lean this session with difficulty correcting Postural control: Left lateral lean;Right lateral lean;Posterior lean Standing balance support: Single extremity supported Standing balance-Leahy Scale: Poor Standing balance comment: heavy posterior lean                           ADL either performed or assessed with clinical judgement   ADL Overall ADL's : Needs assistance/impaired     Grooming: Wash/dry hands;Wash/dry face;Min guard;Standing Grooming Details (indicate cue type and reason): completed at sink                 Toilet Transfer: Minimal assistance;Ambulation Toilet Transfer Details (indicate cue type and reason): Pt requiring min A to cue hand placement and safe walker usage as she ambulated                 Vision       Perception     Praxis      Cognition Arousal/Alertness: Awake/alert Behavior During Therapy: Flat affect Overall Cognitive Status: Impaired/Different from baseline Area of Impairment: Attention;Following commands;Safety/judgement;Awareness;Problem solving                   Current Attention Level: Sustained Memory: Decreased short-term memory Following Commands: Follows one step commands consistently;Follows multi-step commands with increased time Safety/Judgement: Decreased awareness of safety;Decreased awareness of deficits Awareness: Emergent Problem Solving: Slow processing;Requires verbal cues;Requires tactile cues;Difficulty sequencing;Decreased initiation General Comments: Slow processing/slow to respond        Exercises     Shoulder Instructions       General Comments Pt reports extreme drowsyness, husband noted that she has slept most of the day.    Pertinent Vitals/ Pain  Pain Assessment: No/denies pain Faces Pain Scale: Hurts a little bit Pain Location: RLQ Pain Descriptors / Indicators: Aching Pain Intervention(s): Monitored during session  Home Living                                           Prior Functioning/Environment              Frequency  Min 2X/week        Progress Toward Goals  OT Goals(current goals can now be found in the care plan section)  Progress towards OT goals: Not progressing toward goals - comment  Acute Rehab OT Goals Patient Stated Goal: Ready to go to rehab OT Goal Formulation: With patient/family Time For Goal Achievement: 06/11/21 Potential to Achieve Goals: Good ADL Goals Pt Will Perform Grooming: with supervision;standing Pt Will Perform Lower Body Bathing: with min guard assist;sitting/lateral leans;sit to/from stand Pt Will Perform Lower Body Dressing: with min guard assist;sitting/lateral leans;sit to/from stand Pt Will Transfer to Toilet: with supervision;ambulating Pt Will Perform Toileting - Clothing Manipulation and hygiene: with min guard assist;sitting/lateral leans;sit to/from stand Pt/caregiver will Perform Home Exercise Program: Increased strength;Both right and left upper extremity;Independently;With written HEP provided  Plan Discharge plan remains appropriate;Frequency remains appropriate    Co-evaluation                 AM-PAC OT "6 Clicks" Daily Activity     Outcome Measure   Help from another person eating meals?: None Help from another person taking care of personal grooming?: A Little Help from another person toileting, which includes using toliet, bedpan, or urinal?: A Little Help from another person bathing (including washing, rinsing, drying)?: A Lot Help from another person to put on and taking off regular upper body clothing?: A Little Help from another person to put on and taking off regular lower body clothing?: A Little 6 Click Score: 18    End of Session Equipment Utilized During Treatment: Gait belt  OT Visit Diagnosis: Unsteadiness on feet (R26.81);Other abnormalities of gait and mobility (R26.89);Muscle weakness (generalized) (M62.81)   Activity Tolerance  Patient limited by fatigue   Patient Left in chair;with call bell/phone within reach;with chair alarm set;with family/visitor present   Nurse Communication Mobility status        Time: 9728-2060 OT Time Calculation (min): 12 min  Charges: OT General Charges $OT Visit: 1 Visit OT Treatments $Self Care/Home Management : 8-22 mins  Ayriel Texidor H., OTR/L Acute Rehabilitation   Samera Macy Elane Yolanda Bonine 06/03/2021, 6:42 PM

## 2021-06-03 NOTE — CV Procedure (Signed)
     TRANSESOPHAGEAL ECHOCARDIOGRAM   NAME:  LANYIA JEWEL   MRN: [id number] DOB:  Apr 29, 1951   ADMIT DATE: 05/24/2021  INDICATIONS: CVA  PROCEDURE:   Informed consent was obtained prior to the procedure. The risks, benefits and alternatives for the procedure were discussed and the patient comprehended these risks.  Risks include, but are not limited to, cough, sore throat, vomiting, nausea, somnolence, esophageal and stomach trauma or perforation, bleeding, low blood pressure, aspiration, pneumonia, infection, trauma to the teeth and death.    After a procedural time-out, the oropharynx was anesthetized and the patient was sedated by the anesthesia service. The transesophageal probe was inserted in the esophagus and stomach without difficulty and multiple views were obtained. Anesthesia was monitored by Dorthea Cove, CRNA and Dr Kalman Shan.    COMPLICATIONS:    There were no immediate complications.  FINDINGS:  No vegetation or thrombus seen.  Positive bubble study with significant shunting seen, suggests large PFO.  Oswaldo Milian MD [facility]  [address], [place], [place] [address] [phone]   3:11 PM

## 2021-06-03 NOTE — Anesthesia Postprocedure Evaluation (Signed)
Anesthesia Post Note  Patient: Marguerette Sheller Jiminez  Procedure(s) Performed: TRANSESOPHAGEAL ECHOCARDIOGRAM (TEE) BUBBLE STUDY     Patient location during evaluation: PACU Anesthesia Type: MAC Level of consciousness: awake and alert Pain management: pain level controlled Vital Signs Assessment: post-procedure vital signs reviewed and stable Respiratory status: spontaneous breathing, nonlabored ventilation, respiratory function stable and patient connected to nasal cannula oxygen Cardiovascular status: stable and blood pressure returned to baseline Postop Assessment: no apparent nausea or vomiting Anesthetic complications: no   No notable events documented.  Last Vitals:  Vitals:   06/03/21 0837 06/03/21 0851  BP: 131/81 126/79  Pulse: 99 94  Resp: (!) 23 (!) 22  Temp:  37 C  SpO2: 94% 96%    Last Pain:  Vitals:   06/03/21 0851  TempSrc: Oral  PainSc:                  Carmie Lanpher S

## 2021-06-03 NOTE — Progress Notes (Signed)
Inpatient Rehab Admissions Coordinator:   Notified that insurance has denied request 2/2 pt not medically ready to discharge.  Dr. [place] in agreement since further cardiology workup may be warranted.  Will likely need to pursue appeal once medically stable.   Shann Medal, PT, DPT Admissions Coordinator [phone] 06/03/21  2:16 PM

## 2021-06-04 DIAGNOSIS — R000 Tachycardia, unspecified: Secondary | ICD-10-CM

## 2021-06-04 LAB — CULTURE, BLOOD (ROUTINE X 2)
Culture: NO GROWTH
Culture: NO GROWTH

## 2021-06-04 LAB — GLUCOSE, CAPILLARY: Glucose-Capillary: 105 mg/dL — ABNORMAL HIGH (ref 70–99)

## 2021-06-04 NOTE — Progress Notes (Addendum)
PROGRESS NOTE    Michaela Rios  VEH:[id number] DOB: 03/17/1951 DOA: 05/24/2021 PCP: Binnie Rail, MD    Brief Narrative:  Michaela Rios is a 70 year old female with past medical history significant for essential hypertension, prediabetes, depression, CKD stage III, history of peptic ulcer disease, chronic low back pain, migraine headache who presented to [facility] on 6/27 with generalized weakness, loss of balance.  Patient reports she was going to the bathroom lost her balance and fell.  She has been having some chills.  Patient also reports poor oral intake over the last few days with increased urinary frequency but without dysuria.  No nausea/vomiting/diarrhea, no chest pain, no palpitations, no abdominal pain.  In the ED, BP 164/116, HR 141, RR 17, temperature 101.4 F, SPO2 96% on room air.  Sodium 139, potassium 2.8, chloride 105, CO2 22, glucose 121, BUN 9, creatinine 0.77.  AST 24, ALT 16, total bilirubin 0.5.  WBC 8.6, hemoglobin 11.6, platelets 243.  Lactic acid 2.1.  Urinalysis negative.  COVID-19 PCR negative.  Influenza A/B PCR negative.  D-dimer elevated 2.51.  Chest x-ray with no acute cardiopulmonary disease process.  CT renal stone study with no acute obstructing urinary tract or ureteral calculus, no uropathy or hydronephrosis, no acute intra-abdominal or pelvic finding.  CT angiogram chest with no evidence of acute pulmonary embolism, 4 mm right middle lobe peripheral nodular opacity.   Assessment & Plan:   Principal Problem:   Sepsis ([place]) Active Problems:   Anxiety   Depression   Essential hypertension   Prediabetes   Hypokalemia   Tachycardia   Cerebral thrombosis with cerebral infarction   SIRS Fever of unknown origin Left greater than right brain abnormal signal in our MRI Patient presenting to the ED with weakness, fever with temperature 101.4 F, tachycardia, tachypnea and mild elevation of lactic acid with associated confusion.  Chest x-ray negative for  acute cardiopulmonary disease process.  Urinalysis negative for infection.  COVID/influenza PCR negative.  Blood cultures and urine culture with no growth.  CT abdomen/pelvis with no acute intra-abdominal/pelvic process.  Underwent lumbar puncture on 6/30.  Patient was initially started on the broad-spectrum antibiotics with gentamicin, vancomycin, Flagyl, aztreonam.  Infectious disease was initially consulted, antibiotics were discontinued with negative infectious work-up.  ESR 37, CRP 6.8.  RPR negative, VR DL nonreactive.  ANA positive, double-stranded DNA negative.  ANCA negative. ACE level within normal limits. Rheumatoid factor negative.  B12 254, B1 level normal, TSH within normal limits.  Acute hepatitis panel negative.  Complement levels within normal limits. Cerebral angiogram 7/5; no angiographic evidence of vasculitis, no intracranial stenosis/vasospasm, vascular occlusion, aneurysm, AVM, dural AV fistula or other significant vascular abnormality.  CT head with new multifocal hypoattenuation basal ganglia and thalamus bilaterally of indeterminate etiology, no mass-effect to suggest tumor.  MR brain with right basal ganglia infarct, hyperdensity and mild enlargement of the left head of the caudate, left thalamus and small amount of hyperintensity in the right medial thalamus with recommendation of postcontrast MRI for further evaluation.  MRI with contrast with mild largely vascular perivascular appearing enhancement in the areas of the signal abnormality in the deep gray nuclei as well as small amount of leptomeningeal enhancement over the right and possibly left parietal convexities, no solid mass enhancing concerning for cryptococcus, vasculitis, neoplasm, angiocentric lymphoma versus granulomatosis, autoimmune or inflammatory condition.  Repeat follow-up MR brain with and without contrast 7/6 with numerous small acute bilateral cerebral and cerebellar infarcts compatible with emboli, expected  evolution of right basal ganglia infarct with decreased T2 signal abnormality and enhancement in the left greater than right deep gray matter and deep white matter tracts.  Completed 3-day course of IV cell Medrol 1 g every 24 hours on 7/5.  CSF cytology negative for malignant cells, CSF oligoclonal bands negative. --Neurology following, appreciate assistance --Remains afebrile --CSF paraneoplastic panel: Pending --Repeat MRI in 1 month with Dr. 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  Initially thought to be infectious, chest x-ray, CT angiogram chest, and urinalysis unrevealing.  Brain with right basal ganglia infarct and hyperintensity and mild enlargement of the left head of the caudate, left thalamus and small amount of hyperintensity right medial thalamus.  Hemoglobin A1c 5.9.  LDL 45.  TTE with LVEF 60 to 65%, no LV regional wall motion normalities, grade 1 diastolic dysfunction, mild MR, no aortic valve stenosis, IVC normal in size.  TEE EF 60 to 65%, no left atrial left atrial appendage thrombus, bubble study positive for shunting suggestive of a large patent foramen ovale.  Neurology believes her embolic appearing stroke secondary to cerebral angiogram procedure complication versus cryptogenic, and due to low rope score, no need for closure of PFO per Dr. Leonie Man and Dr. Erlinda Hong.  Neurology consulted cardiology for loop recorder placement to evaluate for occult atrial fibrillation. --Aspirin 81 mg p.o. daily --Plavix 75 mg p.o. daily --No statin as LDL below goal <70 (45) --Continue PT/OT efforts while inpatient. --Outpatient follow-up with neurology, Dr. Leonie Man in 4 weeks --Loop recorder placement per EP --Pending insurance authorization for CIR  Right hand hematoma Patient underwent cerebral angiogram via right radial artery  catheterization on 7/5 with resultant right dorsal hand hematoma.  Left upper extremity arterial duplex negative for pseudoaneurysm.  Seen by vascular surgery with no focal hematoma to drain as this is diffuse interstitial and intramuscular edema. --Continue PT/OT efforts with hand ROM  Right middle lobe nodule Incidental finding of a 4 mm right middle lobe peripheral nodular opacity.  Recommend repeat CT chest 12 months.  Elevated D-dimer D-dimer elevated 2.51 admission, bilateral duplex ultrasound lower extremities negative for DVT, CT angiogram chest negative for PE.  Essential hypertension BP 134/83 this morning. --Amlodipine 5 mg p.o. daily --Hydralazine 25 mg p.o. twice daily --Metoprolol succinate 100 mg p.o. daily  Depression:  --Venlafaxine 75 mg p.o. daily --Wellbutrin 300 mg p.o. daily  Anxiety: Lorazepam 1 mg twice daily as needed  Migraine headaches: Topamax 150 mg p.o. nightly, sumatriptan as needed   DVT prophylaxis: enoxaparin (LOVENOX) injection 40 mg Start: 06/02/21 1000   Code Status: Full Code Family Communication: None present at bedside this morning.  Disposition Plan:  Level of care: Telemetry Medical Status is: Inpatient  Remains inpatient appropriate because:Ongoing diagnostic testing needed not appropriate for outpatient work up, Unsafe d/c plan, IV treatments appropriate due to intensity of illness or inability to take PO, and Inpatient level of care appropriate due to severity of illness  Dispo: The patient is from: Home              Anticipated d/c is to: CIR              Patient currently is medically stable to d/c.   Difficult to place patient No    Consultants:  Infectious disease Neurology Vascular surgery  Interventional radiology Cardiology  Procedures:  Lumbar puncture 6/30 Cerebral angiogram 7/5; no  angiographic evidence of vasculitis, no intracranial stenosis/vasospasm, vascular occlusion, aneurysm, AVM, dural AV fistula or other  significant vascular abnormality TEE with contrast bubble study positive for large PFO  Antimicrobials:  Gentamicin 6/27 - 6/27 Flagyl 6/27 - 6/29 Vancomycin 6/27 - 6/29 Aztreonam 6/27 - 7/1  Subjective: Patient seen examined bedside, resting comfortably.  Lying in bed.  No family present.  No questions or concerns at this time.  Discussed with neurology, Dr. Erlinda Hong this morning, no need for PFO closure due to low rope score of 2 and believe the embolic strokes due to periprocedural complication during angiogram.  They will contact EP for loop recorder placement.  Neurology now signing off, stable for discharge to rehab. Patient denies headache, no chest pain, no palpitations, no shortness of breath, no abdominal pain.  No acute events overnight per nursing staff.  Objective: Vitals:   06/04/21 0434 06/04/21 0624 06/04/21 0726 06/04/21 1031  BP: 139/79 134/83 138/82 135/88  Pulse: 100  (!) 107 97  Resp: 17  18   Temp: 99.6 F (37.6 C)  98.3 F (36.8 C)   TempSrc: Oral  Oral   SpO2: 98%  100%   Weight:      Height:        Intake/Output Summary (Last 24 hours) at 06/04/2021 1312 Last data filed at 06/04/2021 0434 Gross per 24 hour  Intake --  Output 400 ml  Net -400 ml   Filed Weights   05/24/21 0956 06/03/21 0712  Weight: 60.8 kg 60.8 kg    Examination:  General exam: Appears calm and comfortable  Respiratory system: Clear to auscultation. Respiratory effort normal.  On room air Cardiovascular system: S1 & S2 heard, RRR. No JVD, murmurs, rubs, gallops or clicks. No pedal edema. Gastrointestinal system: Abdomen is nondistended, soft and nontender. No organomegaly or masses felt. Normal bowel sounds heard. Central nervous system: Alert and oriented. No focal neurological deficits.  Sensation to light touch intact. Extremities: Moves all extremities independently, muscle strength 5/5 RLE, right hand limited by pain/hematoma, LUE 4/5 and LLE 4+/5.  Skin: Right hand swelling,  otherwise no rashes, lesions or ulcers Psychiatry: Judgement and insight appear normal. Mood & affect appropriate.     Data Reviewed: I have personally reviewed following labs and imaging studies  CBC: Recent Labs  Lab 05/30/21 1334 06/02/21 0826  WBC 6.1 10.0  HGB 10.4* 11.5*  HCT 31.6* 34.8*  MCV 89.5 89.5  PLT 284 592   Basic Metabolic Panel: Recent Labs  Lab 05/30/21 1334 06/01/21 0203 06/02/21 0826  NA 135 137 140  K 3.9 3.5 3.7  CL 109 109 111  CO2 21* 20* 20*  GLUCOSE 104* 146* 123*  BUN _0 CREATININE 0.66 0.58 0.66  CALCIUM 8.7* 8.9 9.1   GFR: Estimated Creatinine Clearance: 54.1 mL/min (by C-G formula based on SCr of 0.66 mg/dL). Liver Function Tests: No results for input(s): AST, ALT, ALKPHOS, BILITOT, PROT, ALBUMIN in the last 168 hours. No results for input(s): LIPASE, AMYLASE in the last 168 hours. No results for input(s): AMMONIA in the last 168 hours. Coagulation Profile: No results for input(s): INR, PROTIME in the last 168 hours. Cardiac Enzymes: No results for input(s): CKTOTAL, CKMB, CKMBINDEX, TROPONINI in the last 168 hours. BNP (last 3 results) No results for input(s): PROBNP in the last 8760 hours. HbA1C: No results for input(s): HGBA1C in the last 72 hours. CBG: Recent Labs  Lab 05/31/21 2213 06/02/21 1350 06/04/21 0730  GLUCAP 137* 169*  105*   Lipid Profile: No results for input(s): CHOL, HDL, LDLCALC, TRIG, CHOLHDL, LDLDIRECT in the last 72 hours. Thyroid Function Tests: No results for input(s): TSH, T4TOTAL, FREET4, T3FREE, THYROIDAB in the last 72 hours. Anemia Panel: No results for input(s): VITAMINB12, FOLATE, FERRITIN, TIBC, IRON, RETICCTPCT in the last 72 hours. Sepsis Labs: No results for input(s): PROCALCITON, LATICACIDVEN in the last 168 hours.  Recent Results (from the past 240 hour(s))  CSF culture w Gram Stain     Status: None   Collection Time: 05/27/21  7:14 PM   Specimen: CSF; Cerebrospinal Fluid   Result Value Ref Range Status   Specimen Description CSF  Final   Special Requests NONE  Final   Gram Stain   Final    WBC PRESENT, PREDOMINANTLY MONONUCLEAR NO ORGANISMS SEEN CYTOSPIN SMEAR    Culture   Final    NO GROWTH 3 DAYS Performed at [facility], [address] [place], [place] [address]    Report Status 05/31/2021 FINAL  Final  Culture, blood (routine x 2)     Status: None   Collection Time: 05/30/21  4:11 PM   Specimen: BLOOD RIGHT HAND  Result Value Ref Range Status   Specimen Description BLOOD RIGHT HAND  Final   Special Requests   Final    BOTTLES DRAWN AEROBIC AND ANAEROBIC Blood Culture results may not be optimal due to an inadequate volume of blood received in culture bottles   Culture   Final    NO GROWTH 5 DAYS Performed at [facility], [place] [address], [place], [place] [address]    Report Status 06/04/2021 FINAL  Final  Culture, blood (routine x 2)     Status: None   Collection Time: 05/30/21  4:11 PM   Specimen: BLOOD RIGHT HAND  Result Value Ref Range Status   Specimen Description BLOOD RIGHT HAND  Final   Special Requests   Final    BOTTLES DRAWN AEROBIC ONLY Blood Culture results may not be optimal due to an inadequate volume of blood received in culture bottles   Culture   Final    NO GROWTH 5 DAYS Performed at [facility], [place] [address] [place], [place] [address]    Report Status 06/04/2021 FINAL  Final         Radiology Studies: MR BRAIN W WO CONTRAST  Result Date: 06/02/2021 CLINICAL DATA:  Stroke follow-up. Negative diagnostic cerebral angiogram yesterday. EXAM: MRI HEAD WITHOUT AND WITH CONTRAST TECHNIQUE: Multiplanar, multiecho pulse sequences of the brain and surrounding structures were obtained without and with intravenous contrast. CONTRAST:  20m GADAVIST GADOBUTROL 1 MMOL/ML IV SOLN COMPARISON:  05/26/2021 FINDINGS: Brain: There are multiple subcentimeter acute infarcts scattered throughout both cerebral  hemispheres primarily involving cortex and subcortical white matter of all lobes. Small acute infarcts are also present in the left caudate head, both thalami, and right and possibly left cerebellar hemispheres. Restricted diffusion and T2 hyperintensity in the right basal ganglia have mildly decreased. T2 hyperintensity involving the left basal ganglia, left greater than right thalami, and left greater than right internal and external capsules has also decreased with decreased enlargement/swelling of the left caudate and left thalamus. There is some intrinsic T1 hyperintensity associated with the right basal ganglia infarct as well as some true, mild hazy as well as vascular appearing enhancement. The small amount of scattered enhancement within the left basal ganglia and left thalamus on the prior MRI has decreased. There may be  a small amount of residual leptomeningeal enhancement within biparietal sulci (lack of sagittal T1 postcontrast imaging on today's study limits direct comparison). There is also minimal persistent smooth pachymeningeal enhancement over both cerebral convexities. There is mild generalized cerebral atrophy. No intracranial hemorrhage, midline shift, or extra-axial fluid collection is evident. Vascular: Major intracranial vascular flow voids are preserved. Skull and upper cervical spine: Unremarkable bone marrow signal. Sinuses/Orbits: Unremarkable orbits. Paranasal sinuses and mastoid air cells are clear. Other: None. IMPRESSION: 1. 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 2. Expected evolution of right basal ganglia infarct. 3. Decreased T2 signal abnormality and enhancement in the left greater than right deep gray nuclei and deep white matter tracts with differential considerations as previously outlined. Electronically Signed   By: Logan Bores M.D.   On: 06/02/2021 15:56   ECHO TEE  Result Date: 06/03/2021    TRANSESOPHOGEAL ECHO REPORT    Patient Name:   ANDREYA LACKS [facility] Date of Exam: 06/03/2021 Medical Rec #:  [id number]         Height:       63.0 in Accession #:    [id number]        Weight:       134.0 lb Date of Birth:  [birth year]         BSA:          1.631 m Patient Age:    67 years          BP:           152/96 mmHg Patient Gender: F                 HR:           100 bpm. Exam Location:  Inpatient Procedure: Transesophageal Echo, Color Doppler and Cardiac Doppler Indications:     stroke  History:         Patient has prior history of Echocardiogram examinations, most                  recent 05/26/2021. Risk Factors:Hypertension.  Sonographer:     Johny Chess Referring Phys:  [id number] PAULA V ROSS Diagnosing Phys: Oswaldo Milian MD PROCEDURE: After discussion of the risks and benefits of a TEE, an informed consent was obtained from the patient. The transesophogeal probe was passed without difficulty through the esophogus of the patient. Local oropharyngeal anesthetic was provided with Cetacaine. Sedation performed by different physician. The patient was monitored while under deep sedation. Anesthestetic sedation was provided intravenously by Anesthesiology: 251m of Propofol. The patient developed no complications during the procedure. IMPRESSIONS  1. Left ventricular ejection fraction, by estimation, is 60 to 65%. The left ventricle has normal function.  2. Right ventricular systolic function is normal. The right ventricular size is normal.  3. No left atrial/left atrial appendage thrombus was detected.  4. The mitral valve is normal in structure. Trivial mitral valve regurgitation.  5. The aortic valve is tricuspid. Aortic valve regurgitation is not visualized.  6. Agitated saline contrast bubble study was positive with shunting observed within 3-6 cardiac cycles suggestive of interatrial shunt. Significant shunting seen, suggestive of large patent foramen ovale. FINDINGS  Left Ventricle: Left ventricular ejection fraction, by estimation,  is 60 to 65%. The left ventricle has normal function. The left ventricular internal cavity size was normal in size. Right Ventricle: The right ventricular size is normal. No increase in right ventricular wall thickness. Right ventricular systolic function is normal. Left Atrium:  Left atrial size was normal in size. No left atrial/left atrial appendage thrombus was detected. Right Atrium: Right atrial size was normal in size. Pericardium: There is no evidence of pericardial effusion. Mitral Valve: The mitral valve is normal in structure. Trivial mitral valve regurgitation. Tricuspid Valve: The tricuspid valve is normal in structure. Tricuspid valve regurgitation is trivial. Aortic Valve: The aortic valve is tricuspid. Aortic valve regurgitation is not visualized. Pulmonic Valve: The pulmonic valve was grossly normal. Pulmonic valve regurgitation is mild. Aorta: The aortic root and ascending aorta are structurally normal, with no evidence of dilitation. IAS/Shunts: No atrial level shunt detected by color flow Doppler. Agitated saline contrast was given intravenously to evaluate for intracardiac shunting. Agitated saline contrast bubble study was positive with shunting observed within 3-6 cardiac cycles suggestive of interatrial shunt. A large patent foramen ovale is detected. Oswaldo Milian MD Electronically signed by Oswaldo Milian MD Signature Date/Time: 06/03/2021/3:09:44 PM    Final         Scheduled Meds:  amLODipine  5 mg Oral q AM   aspirin EC  81 mg Oral Daily   buPROPion  300 mg Oral Daily   calcium carbonate  1 tablet Oral Q breakfast   cholecalciferol  1,000 Units Oral Daily   clopidogrel  75 mg Oral Daily   enoxaparin (LOVENOX) injection  40 mg Subcutaneous Q24H   feeding supplement  237 mL Oral BID BM   hydrALAZINE  25 mg Oral BID   metoprolol succinate  100 mg Oral Daily   multivitamin with minerals  1 tablet Oral Daily   pantoprazole  40 mg Oral Daily   topiramate  150 mg  Oral QHS   venlafaxine XR  75 mg Oral Q breakfast   vitamin B-12  1,000 mcg Oral Daily   Continuous Infusions:   LOS: 10 days    Time spent: 39 minutes spent on chart review, discussion with nursing staff, consultants, updating family and interview/physical exam; more than 50% of that time was spent in counseling and/or coordination of care.    Gadiel John J [place] DO [facility] Available via Epic secure chat 7am-7pm After these hours, please refer to coverage provider listed on [email or web address] 06/04/2021, 1:12 PM

## 2021-06-04 NOTE — Progress Notes (Signed)
STROKE TEAM PROGRESS NOTE   INTERVAL HISTORY Her husband and RN are at the bedside.  Husband stated that overall pt is getting better day by day, yesterday she slept all day due to sedation for TEE but today she is back to herself. Pt AAO x 3, still has mild left hemiparesis. Pending CIR. TEE showed large PFO but Dr. Leonie Man does not think it is related to her stroke. Pt denies heart palpitation or heart racing.   Vitals:   06/04/21 0026 06/04/21 0434 06/04/21 0624 06/04/21 0726  BP: (!) 147/86 139/79 134/83 138/82  Pulse: (!) 102 100  (!) 107  Resp: _0 Temp: 98.2 F (36.8 C) 99.6 F (37.6 C)  98.3 F (36.8 C)  TempSrc: Oral Oral  Oral  SpO2: 92% 98%  100%  Weight:      Height:       CBC:  Recent Labs  Lab 05/30/21 1334 06/02/21 0826  WBC 6.1 10.0  HGB 10.4* 11.5*  HCT 31.6* 34.8*  MCV 89.5 89.5  PLT 284 967   Basic Metabolic Panel:  Recent Labs  Lab 06/01/21 0203 06/02/21 0826  NA 137 140  K 3.5 3.7  CL 109 111  CO2 20* 20*  GLUCOSE 146* 123*  BUN 18 15  CREATININE 0.58 0.66  CALCIUM 8.9 9.1   Lipid Panel: LDL 45 HgbA1c: No results for input(s): HGBA1C in the last 168 hours. Urine Drug Screen: No results for input(s): LABOPIA, COCAINSCRNUR, LABBENZ, AMPHETMU, THCU, LABBARB in the last 168 hours.  Alcohol Level No results for input(s): ETH in the last 168 hours.  IMAGING past 24 hours ECHO TEE  Result Date: 06/03/2021    TRANSESOPHOGEAL ECHO REPORT   Patient Name:   Michaela Rios [facility] Date of Exam: 06/03/2021 Medical Rec #:  [id number]         Height:       63.0 in Accession IMPRESSIONS   1. Left ventricular ejection fraction, by estimation, is 60 to 65%. The left ventricle has normal function.   2. Right ventricular systolic function is normal. The right ventricular size is normal.   3. No left atrial/left atrial appendage thrombus was detected.   4. The mitral valve is normal in structure. Trivial mitral valve regurgitation.   5. The aortic valve is  tricuspid. Aortic valve regurgitation is not visualized.   6. Agitated saline contrast bubble study was positive with shunting observed within 3-6 cardiac cycles suggestive of interatrial shunt. Significant shunting seen, suggestive of large patent foramen ovale.   MRI brain Result date: 06/02/2021 IMPRESSION: 1. 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 2. Expected evolution of right basal ganglia infarct. 3. Decreased T2 signal abnormality and enhancement in the left greater than right deep gray nuclei and deep white matter tracts with differential considerations as previously outlined.  Cetebral angiogram Result date: 06/01/2021 IMPRESSION: 1. No angiographic evidence of vasculitis. 2. No significant intracranial stenosis, vasospasm, vascular occlusion, aneurysm, AVM, dural AV fistula or other significant vascular abnormality.  CTA head/neck: Result date 05/26/2021 IMPRESSION: 1. Minimal atherosclerosis in the head and neck without large vessel occlusion or significant stenosis. 2. Aortic Atherosclerosis (ICD10-I70.0).  MRI brain Result date 05/26/2021   IMPRESSION: Mild, largely vascular/perivascular appearing enhancement in the areas of signal abnormality in the deep gray nuclei as well as a small amount of leptomeningeal enhancement over the right and possibly left parietal convexities. No solid enhancing mass.   Differential diagnosis includes infection (  including cryptococcus), vasculitis, unusual neoplasm (such as angiocentric lymphoma), and granulomatous/autoimmune/other inflammatory conditions.  CT head Result date 05/26/2021 IMPRESSION: New multifocal hypoattenuation in the basal ganglia and thalami bilaterally of indeterminate etiology. Considerations include multifocal infarcts (including venous infarcts), toxic/metabolic insult, hypoxic-ischemic injury, and infection. No mass effect to suggest tumor. Correlate with  pending MRI.   CT abd/pelvis Result date: 05/25/2021 Hepatic steatosis.  No acute findings in the abdomen or pelvis.  Pertinent labs:  CSF WBC 2 CSF RBC 26 CSF Protein 82 CSF glucose 52 CSF VDRL -nonreactive CSF cytology - No malignant cells CSF oligoclonal bands - negative CSF mayo paraneoplstic panel - pending   ANA positive DSDNA - negative RF - negative Hepatitis antibody panel - negative RPR - non-reactive SSA,SSB -negative ANCA - negative C3,C4 - normal ACE - 50 (normal) EBV Ab - positive IgG but not IgM, consistent with previous infection CRP 6.8 ESR 37 TSH normal B12 254 B1 normal HIV negative    PHYSICAL EXAM Pleasant elderly lady not in distress. Afebrile. Head is nontraumatic. Neck is supple without bruit.    Cardiac exam no murmur or gallop, regular rate and rhythm. Lungs are clear to auscultation. Distal pulses are well felt.  Neurological Exam Awake alert oriented to time, age, place and person.  Delayed recall 2/3.  Able to name animals and objects well.  Follows commands well.  Extraocular movements are full range without nystagmus.  Blinks to threat bilaterally.  Fundi not visualized.  Face is symmetric.  Tongue midline.  Motor system exam mild pronator drift on the left with slight diminished fine finger movements on the left.  Mild proximal bilateral lower extremity weakness left greater than right.  No sensory loss.  Coordination slow but grossly intact.  Gait not tested.  ASSESSMENT/PLAN Michaela Rios is a 70 y.o. female with history of hypertension who presented with left-sided weakness and fever.  MRI brain revealed right-sided stroke and left-sided T2 changes.     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 cryptogenic CT 6/29 head right BG/CR infarct MRI 6/29 confirmed right BG/CR infarct CTA head and neck 6/29 - no  significant finding MRI 7/6 Numerous small acute bilateral cerebral and cerebellar infarcts compatible with emboli. 2D Echo (6/29):  EF 60-65% TEE 7/7 EF 60-65%, Agitated saline contrast bubble study was positive for a large PFO.  No vegetation of thrombus seen LDL 45 HgbA1c 5.9 VTE prophylaxis - lovenox Recommend loop recorder to evaluate for occult atrial fibrillation before discharge - EP contacted No antithrombotic prior to admission, now on aspirin 81 mg daily and clopidogrel 75 mg daily DAPT for 3 weeks and then ASA alone. Therapy recommendations:  CIR Disposition:  pending   Large PFO TEE 7/7 Agitated saline contrast bubble study was positive for a large PFO.  Per Dr. Leonie Man "ROPE score is 2 points indicating 0% chance that stroke is due to PFO." Follow up with Dr. Leonie [facility] at [facility] in 4 weeks   Left > Right brain abnormal signal on MRI MRI brain showed Hyperintensity and mild enlargement of the left head of caudate. Hyperintensity also in the left thalamus and a small amount of hyperintensity in the right medial thalamus. MRI with contrast Mild, largely vascular/perivascular appearing enhancement in the areas of signal abnormality in the deep gray nuclei as well as a small amount of leptomeningeal enhancement over the right and possibly left parietal convexities.  No solid enhancing mass Asymptomatic for focal deficit at this time EEG cortical dysfunction arising from left temporal region, nonspecific etiology, no seizure CSF WBC 2, RBC 22, protein 82 and glucose 52.  Cryptococcal antigen, VDRL, oligoclonal bands, HSV PCR, cytology all negative, paraneoplastic panel pending ESR 37 and CRP 6.8 Autoimmune work up positive for ANA, but negative for ANCA, SSA/B, RF, dsDNA, C3/C4, ACE Cerebral angiogram 7/5 - no vasculitis or vascular abnormality MRI 7/6 Decreased T2 signal abnormality and enhancement in the left greater than right deep gray nuclei and deep white matter tracts Recommend to  repeat MRI in 1 months with Dr. Leonie Man as outpt  Hypertension Home meds:  hydralazine 83m bid, amlodipin 557mdaily, Toprol XL 10035maily, macobid 100m75mily Stable Long-term BP goal normotensive  Lipid management Home meds:  none LDL 45, goal < 70 No statin as LDL well below goal  Other Stroke Risk Factors Advanced Age >/= 65  69gaine headache, on topamax, and takes imitrex prn  Other Active Problems right dorsal hand hematoma following cerebral angiogram via right radial artery: vascular surgery on board - monitoring Incidental finding: 4 mm right middle lobe peripheral nodular opacity, Recommend repeat CT chest 12 months. Depression/anxiety: Wellbutrin 300mg69mly  Hospital day # 10  I had long discussion with pt and husband at bedside, updated pt current condition, treatment plan and potential prognosis, and answered all the questions. They expressed understanding and appreciation. I also discussed [place]. Neurology will sign off. Please call with questions. Pt will follow up [facility] stroke clinic [facility] Thanks for the consult.  JindoRosalin HawkingPhD Stroke Neurology 06/04/2021 11:11 AM   To contact Stroke Continuity provider, please refer to [email or web address], contact General Neurology

## 2021-06-04 NOTE — Progress Notes (Signed)
Physical Therapy Treatment Patient Details Name: Michaela Rios MRN: [id number] DOB: 1951-02-06 Today's Date: 06/04/2021    History of Present Illness Pt is a 70 y.o. female admitted 05/24/21 with c/o weakness, fall due to loss of balance, poor PO intake. Workup for sepsis, unclear etiology. Pt with confusion, neuro consult for possible CNS infection. Brain MRI 6/29 showed R basal ganglia stroke in addition to accelerated small vessel disease. 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 PMH includes HTN, CKD 3, migraines, chronic LBP, prediabetes, anxiety, depression.    PT Comments    Pt in bed upon arrival to room, agreeable to session focused on seated/standing balance and short-distance gait training. Pt with heavy R and posterior leaning in both sitting and standing tasks today, requiring max cuing and min-mod physical assist to correct initially but by end of session pt able to correct posture with verbal cuing only. Pt overall requiring mod assist for bed mobility, repeated transfers, and gait, RW utilized for balance and to promote upright/midline posture. Pt remains motivated to progress mobility, CIR remains appropriate dispo.    Follow Up Recommendations  CIR;Supervision/Assistance - 24 hour     Equipment Recommendations  None recommended by PT    Recommendations for Other Services       Precautions / Restrictions Precautions Precautions: Fall Precaution Comments: urinary urgency/incontinence (has Depends in room) Restrictions Weight Bearing Restrictions: No    Mobility  Bed Mobility Overal bed mobility: Needs Assistance Bed Mobility: Supine to Sit   Sidelying to sit: Mod assist Supine to sit: Mod assist;HOB elevated     General bed mobility comments: Mod assist for step-wise lifting/translating LEs to EOB, trunk elevation, righting posture to upright as pt with heavy R lateral leaning, and scooting to EOB. Very increased  time.    Transfers Overall transfer level: Needs assistance Equipment used: 1 person hand held assist;Rolling walker (2 wheeled) Transfers: Sit to/from Stand Sit to Stand: Mod assist;Max assist         General transfer comment: First stand requiring max assist for power up, trunk elevation, hip extension to upright, and steadying. STS x3, second 2 attempts requiring mod assist with max cues for trunk flexion when transitioning sit<>stand. Use of RW once standing for gaining balance, helping pt orient to upright standing.  Ambulation/Gait Ambulation/Gait assistance: Min assist Gait Distance (Feet): 20 Feet Assistive device: Rolling walker (2 wheeled) Gait Pattern/deviations: Step-to pattern;Decreased step length - left;Decreased dorsiflexion - left;Drifts right/left;Trunk flexed Gait velocity: decr   General Gait Details: mod assist to steady, steer RW, intermittently increase LLE clearance during swing phase. Max cuing for sequencing, RW use, upright posture, midline posture as opposed to R lateral leaning.   Stairs             Wheelchair Mobility    Modified Rankin (Stroke Patients Only) Modified Rankin (Stroke Patients Only) Pre-Morbid Rankin Score: No significant disability Modified Rankin: Moderately severe disability     Balance Overall balance assessment: Needs assistance Sitting-balance support: Single extremity supported;Feet supported Sitting balance-Leahy Scale: Poor Sitting balance - Comments: heavy R and posterior leaning, min-mod assist to correct. Seated interventions: L forearm/elbow propping x5, righting balance from posterior and R lateral leaning, RUE supported sitting. Seated interventions x10 minutes Postural control: Right lateral lean;Posterior lean Standing balance support: Bilateral upper extremity supported Standing balance-Leahy Scale: Poor Standing balance comment: reliant on external support  Cognition Arousal/Alertness: Awake/alert Behavior During Therapy: Flat affect Overall Cognitive Status: Impaired/Different from baseline Area of Impairment: Attention;Following commands;Safety/judgement;Awareness;Problem solving;Memory                   Current Attention Level: Sustained Memory: Decreased short-term memory Following Commands: Follows one step commands consistently;Follows multi-step commands with increased time Safety/Judgement: Decreased awareness of safety;Decreased awareness of deficits Awareness: Emergent Problem Solving: Slow processing;Requires verbal cues;Requires tactile cues;Difficulty sequencing;Decreased initiation General Comments: Pt starting/stopping tasks requiring cues to attend (i.e. taking a sip of water - picks up cup, puts it down before drinking, picks it up again, brings it to her mouth, doesn't drink). Pt with difficulty initiating tasks, and requires step-by-step sequencing cues for mobility.      Exercises Other Exercises Other Exercises: STS x3 from EOB Other Exercises: Standing marches x5 bilat    General Comments General comments (skin integrity, edema, etc.): HRmax 123 bpm      Pertinent Vitals/Pain Pain Assessment: No/denies pain Pain Intervention(s): Limited activity within patient's tolerance;Monitored during session    Home Living                      Prior Function            PT Goals (current goals can now be found in the care plan section) Acute Rehab PT Goals Patient Stated Goal: Ready to go to rehab PT Goal Formulation: With patient/family Time For Goal Achievement: 06/08/21 Potential to Achieve Goals: Good Progress towards PT goals: Progressing toward goals    Frequency    Min 4X/week      PT Plan Current plan remains appropriate    Co-evaluation              AM-PAC PT "6 Clicks" Mobility   Outcome Measure  Help needed turning from your back to your side while in a flat bed without  using bedrails?: A Little Help needed moving from lying on your back to sitting on the side of a flat bed without using bedrails?: A Lot Help needed moving to and from a bed to a chair (including a wheelchair)?: A Lot Help needed standing up from a chair using your arms (e.g., wheelchair or bedside chair)?: A Lot Help needed to walk in hospital room?: A Lot Help needed climbing 3-5 steps with a railing? : Total 6 Click Score: 12    End of Session Equipment Utilized During Treatment: Gait belt Activity Tolerance: Patient tolerated treatment well Patient left: in chair;with call bell/phone within reach;with chair alarm set;with family/visitor present Nurse Communication: Mobility status PT Visit Diagnosis: Other abnormalities of gait and mobility (R26.89);Muscle weakness (generalized) (M62.81);Other symptoms and signs involving the nervous system (R29.898)     Time: 3235-5732 PT Time Calculation (min) (ACUTE ONLY): 40 min  Charges:  $Gait Training: 8-22 mins $Therapeutic Activity: 8-22 mins $Neuromuscular Re-education: 8-22 mins                     Stacie Glaze, PT DPT Acute Rehabilitation Services Pager [phone]  Office [phone]    Roxine Caddy E Ruffin Pyo 06/04/2021, 3:06 PM

## 2021-06-04 NOTE — Progress Notes (Signed)
Left hand continues to improve.  Still some swelling over dorsum of hand but soft and less than a few days ago.  2+ radial pulse.  Will sign off  Follow up PRN  Ruta Hinds, MD [facility] [place] Office: [phone]

## 2021-06-04 NOTE — Care Management Important Message (Signed)
Important Message  Patient Details  Name: Michaela Rios MRN: [id number] Date of Birth: 08-15-1951   Medicare Important Message Given:  Yes     [name] 06/04/2021, 8:49 AM

## 2021-06-04 NOTE — Progress Notes (Signed)
Inpatient Rehab Admissions Coordinator:   I do not have insurance auth or a bed on CIR for this Pt. Today. I will continue to follow for potential admit and appeal with her insurance once appeal information is received.  Clemens Catholic, [place], [place] Admissions Coordinator  [phone] ([place]) [phone] (office)

## 2021-06-04 NOTE — Progress Notes (Signed)
  Speech Language Pathology Treatment: Dysphagia;Cognitive-Linquistic  Patient Details Name: Michaela Rios MRN: [id number] DOB: 1951-04-08 Today's Date: 06/04/2021 Time: 4801-6553 SLP Time Calculation (min) (ACUTE ONLY): 16 min  Assessment / Plan / Recommendation Clinical Impression   Pt acknowledges having trouble earlier this admission with one of her bigger pills, but says she has had no trouble swallowing her medications recently. She even says that she has transitioned back to taking them with thin liquids again. She consumed regular solids and thin liquids with no overt s/s of aspiration, taking extra time for mastication PRN. Recommend continuing current diet - no SLP f/u needed for swallowing, but will continue to follow for cognition. During session today she did locate graham crackers on her left side, but had reduced emergent awareness when trying to use her left hand or return objects to her tray, which was placed on her left side.    HPI HPI: Pt is a 70 y.o. female admitted 05/24/21 with c/o weakness, fall due to loss of balance, poor PO intake. Workup for sepsis, unclear etiology. Pt with confusion, neuro consult for possible CNS infection. Brain MRI 6/29 showed R basal ganglia stroke in addition to accelerated small vessel disease. PMH includes HTN, CKD 3, migraines, chronic LBP, prediabetes, anxiety, depression.      SLP Plan  Continue with current plan of care       Recommendations  Diet recommendations: Regular;Thin liquid Liquids provided via: Cup;Straw Medication Administration: Whole meds with liquid Supervision: Patient able to self feed;Intermittent supervision to cue for compensatory strategies Compensations: Slow rate;Small sips/bites;Minimize environmental distractions Postural Changes and/or Swallow Maneuvers: Seated upright 90 degrees                Oral Care Recommendations: Oral care BID Follow up Recommendations: Inpatient Rehab SLP Visit Diagnosis:  Dysphagia, unspecified (R13.10) Plan: Continue with current plan of care       GO                Osie Bond., M.A. [place] Acute Rehabilitation Services Pager [phone] Office [phone]  06/04/2021, 1:41 PM

## 2021-06-05 NOTE — Progress Notes (Signed)
Available next for loop prior to discharge

## 2021-06-05 NOTE — Progress Notes (Signed)
PROGRESS NOTE    Michaela Rios  WCB:[id number] DOB: [birth year] DOA: 05/24/2021 PCP: Binnie Rail, MD    Brief Narrative:  Michaela Rios is a 70 year old female with past medical history significant for essential hypertension, prediabetes, depression, CKD stage III, history of peptic ulcer disease, chronic low back pain, migraine headache who presented to [facility] on 6/27 with generalized weakness, loss of balance.  Patient reports she was going to the bathroom lost her balance and fell.  She has been having some chills.  Patient also reports poor oral intake over the last few days with increased urinary frequency but without dysuria.  No nausea/vomiting/diarrhea, no chest pain, no palpitations, no abdominal pain.  In the ED, BP 164/116, HR 141, RR 17, temperature 101.4 F, SPO2 96% on room air.  Sodium 139, potassium 2.8, chloride 105, CO2 22, glucose 121, BUN 9, creatinine 0.77.  AST 24, ALT 16, total bilirubin 0.5.  WBC 8.6, hemoglobin 11.6, platelets 243.  Lactic acid 2.1.  Urinalysis negative.  COVID-19 PCR negative.  Influenza A/B PCR negative.  D-dimer elevated 2.51.  Chest x-ray with no acute cardiopulmonary disease process.  CT renal stone study with no acute obstructing urinary tract or ureteral calculus, no uropathy or hydronephrosis, no acute intra-abdominal or pelvic finding.  CT angiogram chest with no evidence of acute pulmonary embolism, 4 mm right middle lobe peripheral nodular opacity.   Assessment & Plan:   Principal Problem:   Sepsis ([place]) Active Problems:   Anxiety   Depression   Essential hypertension   Prediabetes   Hypokalemia   Tachycardia   Cerebral thrombosis with cerebral infarction   SIRS Fever of unknown origin Left greater than right brain abnormal signal in our MRI Patient presenting to the ED with weakness, fever with temperature 101.4 F, tachycardia, tachypnea and mild elevation of lactic acid with associated confusion.  Chest x-ray negative for  acute cardiopulmonary disease process.  Urinalysis negative for infection.  COVID/influenza PCR negative.  Blood cultures and urine culture with no growth.  CT abdomen/pelvis with no acute intra-abdominal/pelvic process.  Underwent lumbar puncture on 6/30.  Patient was initially started on the broad-spectrum antibiotics with gentamicin, vancomycin, Flagyl, aztreonam.  Infectious disease was initially consulted, antibiotics were discontinued with negative infectious work-up.  ESR 37, CRP 6.8.  RPR negative, VR DL nonreactive.  ANA positive, double-stranded DNA negative.  ANCA negative. ACE level within normal limits. Rheumatoid factor negative.  B12 254, B1 level normal, TSH within normal limits.  Acute hepatitis panel negative.  Complement levels within normal limits. Cerebral angiogram 7/5; no angiographic evidence of vasculitis, no intracranial stenosis/vasospasm, vascular occlusion, aneurysm, AVM, dural AV fistula or other significant vascular abnormality.  CT head with new multifocal hypoattenuation basal ganglia and thalamus bilaterally of indeterminate etiology, no mass-effect to suggest tumor.  MR brain with right basal ganglia infarct, hyperdensity and mild enlargement of the left head of the caudate, left thalamus and small amount of hyperintensity in the right medial thalamus with recommendation of postcontrast MRI for further evaluation.  MRI with contrast with mild largely vascular perivascular appearing enhancement in the areas of the signal abnormality in the deep gray nuclei as well as small amount of leptomeningeal enhancement over the right and possibly left parietal convexities, no solid mass enhancing concerning for cryptococcus, vasculitis, neoplasm, angiocentric lymphoma versus granulomatosis, autoimmune or inflammatory condition.  Repeat follow-up MR brain with and without contrast 7/6 with numerous small acute bilateral cerebral and cerebellar infarcts compatible with emboli, expected  evolution of right basal ganglia infarct with decreased T2 signal abnormality and enhancement in the left greater than right deep gray matter and deep white matter tracts.  Completed 3-day course of IV cell Medrol 1 g every 24 hours on 7/5.  CSF cytology negative for malignant cells, CSF oligoclonal bands negative. --Neurology following, appreciate assistance --Remains afebrile --CSF paraneoplastic panel: Pending --Repeat MRI in 1 month with Dr. 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  Initially thought to be infectious, chest x-ray, CT angiogram chest, and urinalysis unrevealing.  Brain with right basal ganglia infarct and hyperintensity and mild enlargement of the left head of the caudate, left thalamus and small amount of hyperintensity right medial thalamus.  Hemoglobin A1c 5.9.  LDL 45.  TTE with LVEF 60 to 65%, no LV regional wall motion normalities, grade 1 diastolic dysfunction, mild MR, no aortic valve stenosis, IVC normal in size.  TEE EF 60 to 65%, no left atrial left atrial appendage thrombus, bubble study positive for shunting suggestive of a large patent foramen ovale.  Neurology believes her embolic appearing stroke secondary to cerebral angiogram procedure complication versus cryptogenic, and due to low rope score, no need for closure of PFO per Dr. Leonie Man and Dr. Erlinda Hong.  Neurology consulted cardiology for loop recorder placement to evaluate for occult atrial fibrillation. --Aspirin 81 mg p.o. daily --Plavix 75 mg p.o. daily --No statin as LDL below goal <70 (45) --Continue PT/OT efforts while inpatient. --Outpatient follow-up with neurology, Dr. Leonie Man in 4 weeks --Loop recorder placement per EP; at time of discharge --Pending insurance authorization/Bed for CIR  Right hand hematoma Patient underwent cerebral angiogram via right  radial artery catheterization on 7/5 with resultant right dorsal hand hematoma.  Left upper extremity arterial duplex negative for pseudoaneurysm.  Seen by vascular surgery with no focal hematoma to drain as this is diffuse interstitial and intramuscular edema. --Continue PT/OT efforts with hand ROM  Right middle lobe nodule Incidental finding of a 4 mm right middle lobe peripheral nodular opacity.  Recommend repeat CT chest 12 months.  Elevated D-dimer D-dimer elevated 2.51 admission, bilateral duplex ultrasound lower extremities negative for DVT, CT angiogram chest negative for PE.  Essential hypertension BP 140/91 this morning. --Amlodipine 5 mg p.o. daily --Hydralazine 25 mg p.o. twice daily --Metoprolol succinate 100 mg p.o. daily  Depression:  --Venlafaxine 75 mg p.o. daily --Wellbutrin 300 mg p.o. daily  Anxiety: Lorazepam 1 mg twice daily as needed  Migraine headaches: Topamax 150 mg p.o. nightly, sumatriptan as needed   DVT prophylaxis: enoxaparin (LOVENOX) injection 40 mg Start: 06/02/21 1000   Code Status: Full Code Family Communication: None present at bedside this morning.  Disposition Plan:  Level of care: Telemetry Medical Status is: Inpatient  Remains inpatient appropriate because:Ongoing diagnostic testing needed not appropriate for outpatient work up, Unsafe d/c plan, IV treatments appropriate due to intensity of illness or inability to take PO, and Inpatient level of care appropriate due to severity of illness  Dispo: The patient is from: Home              Anticipated d/c is to: CIR              Patient currently is medically stable to d/c.   Difficult to place patient No    Consultants:  Infectious disease Neurology Vascular surgery  Interventional radiology Cardiology  Procedures:  Lumbar puncture 6/30  Cerebral angiogram 7/5; no angiographic evidence of vasculitis, no intracranial stenosis/vasospasm, vascular occlusion, aneurysm, AVM, dural AV  fistula or other significant vascular abnormality TEE with contrast bubble study positive for large PFO  Antimicrobials:  Gentamicin 6/27 - 6/27 Flagyl 6/27 - 6/29 Vancomycin 6/27 - 6/29 Aztreonam 6/27 - 7/1  Subjective: Patient seen examined bedside, resting comfortably.  Lying in bed.  No family present.  No questions or concerns at this time.  Awaiting CIR bed and insurance authorization. Patient denies headache, no chest pain, no palpitations, no shortness of breath, no abdominal pain.  No acute events overnight per nursing staff.  Objective: Vitals:   06/05/21 0017 06/05/21 0418 06/05/21 0631 06/05/21 0900  BP: 120/79 117/82 (!) 140/91 131/89  Pulse: 100 93    Resp: $Remo'15 18  18  'HaKeb$ Temp: 98.7 F (37.1 C) 98.1 F (36.7 C)    TempSrc: Oral Oral    SpO2:  98%    Weight:      Height:        Intake/Output Summary (Last 24 hours) at 06/05/2021 1314 Last data filed at 06/05/2021 0900 Gross per 24 hour  Intake 480 ml  Output 475 ml  Net 5 ml   Filed Weights   05/24/21 0956 06/03/21 0712  Weight: 60.8 kg 60.8 kg    Examination:  General exam: Appears calm and comfortable  Respiratory system: Clear to auscultation. Respiratory effort normal.  On room air Cardiovascular system: S1 & S2 heard, RRR. No JVD, murmurs, rubs, gallops or clicks. No pedal edema. Gastrointestinal system: Abdomen is nondistended, soft and nontender. No organomegaly or masses felt. Normal bowel sounds heard. Central nervous system: Alert and oriented. No focal neurological deficits.  Sensation to light touch intact. Extremities: Moves all extremities independently, muscle strength 5/5 RLE, right hand limited by pain/hematoma, LUE 4/5 and LLE 4+/5.  Skin: Right hand swelling, otherwise no rashes, lesions or ulcers Psychiatry: Judgement and insight appear normal. Mood & affect appropriate.     Data Reviewed: I have personally reviewed following labs and imaging studies  CBC: Recent Labs  Lab  05/30/21 1334 06/02/21 0826  WBC 6.1 10.0  HGB 10.4* 11.5*  HCT 31.6* 34.8*  MCV 89.5 89.5  PLT 284 292   Basic Metabolic Panel: Recent Labs  Lab 05/30/21 1334 06/01/21 0203 06/02/21 0826  NA 135 137 140  K 3.9 3.5 3.7  CL 109 109 111  CO2 21* 20* 20*  GLUCOSE 104* 146* 123*  BUN $Re'11 18 15  'AsH$ CREATININE 0.66 0.58 0.66  CALCIUM 8.7* 8.9 9.1   GFR: Estimated Creatinine Clearance: 54.1 mL/min (by C-G formula based on SCr of 0.66 mg/dL). Liver Function Tests: No results for input(s): AST, ALT, ALKPHOS, BILITOT, PROT, ALBUMIN in the last 168 hours. No results for input(s): LIPASE, AMYLASE in the last 168 hours. No results for input(s): AMMONIA in the last 168 hours. Coagulation Profile: No results for input(s): INR, PROTIME in the last 168 hours. Cardiac Enzymes: No results for input(s): CKTOTAL, CKMB, CKMBINDEX, TROPONINI in the last 168 hours. BNP (last 3 results) No results for input(s): PROBNP in the last 8760 hours. HbA1C: No results for input(s): HGBA1C in the last 72 hours. CBG: Recent Labs  Lab 05/31/21 2213 06/02/21 1350 06/04/21 0730  GLUCAP 137* 169* 105*   Lipid Profile: No results for input(s): CHOL, HDL, LDLCALC, TRIG, CHOLHDL, LDLDIRECT in the last 72 hours. Thyroid Function Tests: No results for input(s): TSH, T4TOTAL, FREET4, T3FREE, THYROIDAB in the last 72 hours. Anemia  Panel: No results for input(s): VITAMINB12, FOLATE, FERRITIN, TIBC, IRON, RETICCTPCT in the last 72 hours. Sepsis Labs: No results for input(s): PROCALCITON, LATICACIDVEN in the last 168 hours.  Recent Results (from the past 240 hour(s))  CSF culture w Gram Stain     Status: None   Collection Time: 05/27/21  7:14 PM   Specimen: CSF; Cerebrospinal Fluid  Result Value Ref Range Status   Specimen Description CSF  Final   Special Requests NONE  Final   Gram Stain   Final    WBC PRESENT, PREDOMINANTLY MONONUCLEAR NO ORGANISMS SEEN CYTOSPIN SMEAR    Culture   Final    NO GROWTH  3 DAYS Performed at [facility], [address] [place], [place] [address]    Report Status 05/31/2021 FINAL  Final  Culture, blood (routine x 2)     Status: None   Collection Time: 05/30/21  4:11 PM   Specimen: BLOOD RIGHT HAND  Result Value Ref Range Status   Specimen Description BLOOD RIGHT HAND  Final   Special Requests   Final    BOTTLES DRAWN AEROBIC AND ANAEROBIC Blood Culture results may not be optimal due to an inadequate volume of blood received in culture bottles   Culture   Final    NO GROWTH 5 DAYS Performed at [facility], [address], [place], [place] [address]    Report Status 06/04/2021 FINAL  Final  Culture, blood (routine x 2)     Status: None   Collection Time: 05/30/21  4:11 PM   Specimen: BLOOD RIGHT HAND  Result Value Ref Range Status   Specimen Description BLOOD RIGHT HAND  Final   Special Requests   Final    BOTTLES DRAWN AEROBIC ONLY Blood Culture results may not be optimal due to an inadequate volume of blood received in culture bottles   Culture   Final    NO GROWTH 5 DAYS Performed at [facility], [address], [place], [place] [address]    Report Status 06/04/2021 FINAL  Final         Radiology Studies: No results found.      Scheduled Meds:  amLODipine  5 mg Oral q AM   aspirin EC  81 mg Oral Daily   buPROPion  300 mg Oral Daily   calcium carbonate  1 tablet Oral Q breakfast   cholecalciferol  1,000 Units Oral Daily   clopidogrel  75 mg Oral Daily   enoxaparin (LOVENOX) injection  40 mg Subcutaneous Q24H   feeding supplement  237 mL Oral BID BM   hydrALAZINE  25 mg Oral BID   metoprolol succinate  100 mg Oral Daily   multivitamin with minerals  1 tablet Oral Daily   pantoprazole  40 mg Oral Daily   topiramate  150 mg Oral QHS   venlafaxine XR  75 mg Oral Q breakfast   vitamin B-12  1,000 mcg Oral Daily   Continuous Infusions:   LOS: 11 days    Time spent: 35 minutes spent on chart review,  discussion with nursing staff, consultants, updating family and interview/physical exam; more than 50% of that time was spent in counseling and/or coordination of care.    Zian Mohamed J [place] DO [facility] Available via Epic secure chat 7am-7pm After these hours, please refer to coverage provider listed on [email or web address] 06/05/2021, 1:14 PM

## 2021-06-05 NOTE — Progress Notes (Signed)
Physical Therapy Treatment Patient Details Name: Michaela Rios MRN: [id number] DOB: 04-Nov-1951 Today's Date: 06/05/2021    History of Present Illness Pt is a 70 y.o. female admitted 05/24/21 with c/o weakness, fall due to loss of balance, poor PO intake. Workup for sepsis, unclear etiology. Pt with confusion, neuro consult for possible CNS infection. Brain MRI 6/29 showed R basal ganglia stroke in addition to accelerated small vessel disease. 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 PMH includes HTN, CKD 3, migraines, chronic LBP, prediabetes, anxiety, depression.    PT Comments    Pt demonstrating much improved sitting balance today, requiring supervision only to maintain upright. Pt continues to present with R lateral leaning and listing during gait, requires max verbal and tactile cuing throughout to correct. Pt ambulatory for short hallway distance today, with step-by-step verbal cuing needed for sequencing (L foot, R foot, walker) otherwise pt ceases gait and stares forward. PT instructed pt in LE exercises to perform and HEP (ankle pumps, heel slides, quad sets), pt agreeable. PT to continue to follow acutely.     Follow Up Recommendations  CIR;Supervision/Assistance - 24 hour     Equipment Recommendations  None recommended by PT    Recommendations for Other Services       Precautions / Restrictions Precautions Precautions: Fall Precaution Comments: urinary urgency/incontinence (has Depends in room) Restrictions Weight Bearing Restrictions: No    Mobility  Bed Mobility Overal bed mobility: Needs Assistance Bed Mobility: Supine to Sit     Supine to sit: Mod assist     General bed mobility comments: Mod assist for trunk elevation, LE translation to EOB, and step-wise scooting to EOB. Verbal cuing for sequencing task, very increased time and multiple stopping/starting moments    Transfers Overall transfer level: Needs  assistance Equipment used: Rolling walker (2 wheeled) Transfers: Sit to/from Stand Sit to Stand: Mod assist         General transfer comment: Mod assist for initial power up, forward truncal translation, rise, and steadying. Verbal cuing for hand placement when rising.  Ambulation/Gait Ambulation/Gait assistance: Mod assist Gait Distance (Feet): 30 Feet Assistive device: Rolling walker (2 wheeled) Gait Pattern/deviations: Decreased step length - left;Decreased dorsiflexion - left;Drifts right/left;Trunk flexed;Step-through pattern;Staggering right Gait velocity: decr   General Gait Details: mod assist for steadying, tactile correction of R lateral leaning, RW management as pt listing R, and correcting posterior LOB with directional changes and stepping backwards to reach chair.   Stairs             Wheelchair Mobility    Modified Rankin (Stroke Patients Only) Modified Rankin (Stroke Patients Only) Pre-Morbid Rankin Score: No significant disability Modified Rankin: Moderately severe disability     Balance Overall balance assessment: Needs assistance Sitting-balance support: Single extremity supported;Feet supported Sitting balance-Leahy Scale: Fair Sitting balance - Comments: Able to sit EOB with supervision level of assist   Standing balance support: Bilateral upper extremity supported Standing balance-Leahy Scale: Poor Standing balance comment: reliant on external support                            Cognition Arousal/Alertness: Awake/alert Behavior During Therapy: Flat affect Overall Cognitive Status: Impaired/Different from baseline Area of Impairment: Attention;Following commands;Safety/judgement;Awareness;Problem solving;Memory                   Current Attention Level: Sustained Memory: Decreased short-term memory Following Commands: Follows one step commands consistently;Follows multi-step  commands with increased time Safety/Judgement:  Decreased awareness of safety;Decreased awareness of deficits Awareness: Emergent Problem Solving: Slow processing;Requires verbal cues;Requires tactile cues;Difficulty sequencing;Decreased initiation General Comments: very distractible, requires frequent cues for posture and safety during mobility      Exercises General Exercises - Lower Extremity Quad Sets: AROM;Both;5 reps;Seated    General Comments        Pertinent Vitals/Pain Pain Assessment: No/denies pain Faces Pain Scale: No hurt Pain Intervention(s): Monitored during session    Home Living                      Prior Function            PT Goals (current goals can now be found in the care plan section) Acute Rehab PT Goals Patient Stated Goal: Ready to go to rehab PT Goal Formulation: With patient/family Time For Goal Achievement: 06/08/21 Potential to Achieve Goals: Good Progress towards PT goals: Progressing toward goals    Frequency    Min 4X/week      PT Plan Current plan remains appropriate    Co-evaluation              AM-PAC PT "6 Clicks" Mobility   Outcome Measure  Help needed turning from your back to your side while in a flat bed without using bedrails?: A Little Help needed moving from lying on your back to sitting on the side of a flat bed without using bedrails?: A Little Help needed moving to and from a bed to a chair (including a wheelchair)?: A Lot Help needed standing up from a chair using your arms (e.g., wheelchair or bedside chair)?: A Little Help needed to walk in hospital room?: A Lot Help needed climbing 3-5 steps with a railing? : Total 6 Click Score: 14    End of Session Equipment Utilized During Treatment: Gait belt Activity Tolerance: Patient tolerated treatment well Patient left: in chair;with call bell/phone within reach;with chair alarm set;with family/visitor present Nurse Communication: Mobility status PT Visit Diagnosis: Other abnormalities of gait  and mobility (R26.89);Muscle weakness (generalized) (M62.81);Other symptoms and signs involving the nervous system (R29.898)     Time: 4650-3546 PT Time Calculation (min) (ACUTE ONLY): 30 min  Charges:  $Gait Training: 8-22 mins $Neuromuscular Re-education: 8-22 mins                     Stacie Glaze, PT DPT Acute Rehabilitation Services Pager [phone]  Office [phone]    [place] 06/05/2021, 3:59 PM

## 2021-06-06 NOTE — Progress Notes (Signed)
PROGRESS NOTE    Nandika Stetzer Backhaus  WCB:[id number] DOB: [birth year] DOA: 05/24/2021 PCP: Binnie Rail, MD    Brief Narrative:  Michaela Rios is a 70 year old female with past medical history significant for essential hypertension, prediabetes, depression, CKD stage III, history of peptic ulcer disease, chronic low back pain, migraine headache who presented to [facility] on 6/27 with generalized weakness, loss of balance.  Patient reports she was going to the bathroom lost her balance and fell.  She has been having some chills.  Patient also reports poor oral intake over the last few days with increased urinary frequency but without dysuria.  No nausea/vomiting/diarrhea, no chest pain, no palpitations, no abdominal pain.  In the ED, BP 164/116, HR 141, RR 17, temperature 101.4 F, SPO2 96% on room air.  Sodium 139, potassium 2.8, chloride 105, CO2 22, glucose 121, BUN 9, creatinine 0.77.  AST 24, ALT 16, total bilirubin 0.5.  WBC 8.6, hemoglobin 11.6, platelets 243.  Lactic acid 2.1.  Urinalysis negative.  COVID-19 PCR negative.  Influenza A/B PCR negative.  D-dimer elevated 2.51.  Chest x-ray with no acute cardiopulmonary disease process.  CT renal stone study with no acute obstructing urinary tract or ureteral calculus, no uropathy or hydronephrosis, no acute intra-abdominal or pelvic finding.  CT angiogram chest with no evidence of acute pulmonary embolism, 4 mm right middle lobe peripheral nodular opacity.   Assessment & Plan:   Principal Problem:   Sepsis ([place]) Active Problems:   Anxiety   Depression   Essential hypertension   Prediabetes   Hypokalemia   Tachycardia   Cerebral thrombosis with cerebral infarction   SIRS Fever of unknown origin Left greater than right brain abnormal signal in our MRI Patient presenting to the ED with weakness, fever with temperature 101.4 F, tachycardia, tachypnea and mild elevation of lactic acid with associated confusion.  Chest x-ray negative for  acute cardiopulmonary disease process.  Urinalysis negative for infection.  COVID/influenza PCR negative.  Blood cultures and urine culture with no growth.  CT abdomen/pelvis with no acute intra-abdominal/pelvic process.  Underwent lumbar puncture on 6/30.  Patient was initially started on the broad-spectrum antibiotics with gentamicin, vancomycin, Flagyl, aztreonam.  Infectious disease was initially consulted, antibiotics were discontinued with negative infectious work-up.  ESR 37, CRP 6.8.  RPR negative, VR DL nonreactive.  ANA positive, double-stranded DNA negative.  ANCA negative. ACE level within normal limits. Rheumatoid factor negative.  B12 254, B1 level normal, TSH within normal limits.  Acute hepatitis panel negative.  Complement levels within normal limits. Cerebral angiogram 7/5; no angiographic evidence of vasculitis, no intracranial stenosis/vasospasm, vascular occlusion, aneurysm, AVM, dural AV fistula or other significant vascular abnormality.  CT head with new multifocal hypoattenuation basal ganglia and thalamus bilaterally of indeterminate etiology, no mass-effect to suggest tumor.  MR brain with right basal ganglia infarct, hyperdensity and mild enlargement of the left head of the caudate, left thalamus and small amount of hyperintensity in the right medial thalamus with recommendation of postcontrast MRI for further evaluation.  MRI with contrast with mild largely vascular perivascular appearing enhancement in the areas of the signal abnormality in the deep gray nuclei as well as small amount of leptomeningeal enhancement over the right and possibly left parietal convexities, no solid mass enhancing concerning for cryptococcus, vasculitis, neoplasm, angiocentric lymphoma versus granulomatosis, autoimmune or inflammatory condition.  Repeat follow-up MR brain with and without contrast 7/6 with numerous small acute bilateral cerebral and cerebellar infarcts compatible with emboli, expected  evolution of right basal ganglia infarct with decreased T2 signal abnormality and enhancement in the left greater than right deep gray matter and deep white matter tracts.  Completed 3-day course of IV cell Medrol 1 g every 24 hours on 7/5.  CSF cytology negative for malignant cells, CSF oligoclonal bands negative. --Neurology following, appreciate assistance --Remains afebrile --CSF paraneoplastic panel: Pending --Repeat MRI in 1 month with Dr. 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  Initially thought to be infectious, chest x-ray, CT angiogram chest, and urinalysis unrevealing.  Brain with right basal ganglia infarct and hyperintensity and mild enlargement of the left head of the caudate, left thalamus and small amount of hyperintensity right medial thalamus.  Hemoglobin A1c 5.9.  LDL 45.  TTE with LVEF 60 to 65%, no LV regional wall motion normalities, grade 1 diastolic dysfunction, mild MR, no aortic valve stenosis, IVC normal in size.  TEE EF 60 to 65%, no left atrial left atrial appendage thrombus, bubble study positive for shunting suggestive of a large patent foramen ovale.  Neurology believes her embolic appearing stroke secondary to cerebral angiogram procedure complication versus cryptogenic, and due to low rope score, no need for closure of PFO per Dr. Leonie Man and Dr. Erlinda Hong.  Neurology consulted cardiology for loop recorder placement to evaluate for occult atrial fibrillation. --Aspirin 81 mg p.o. daily --Plavix 75 mg p.o. daily --No statin as LDL below goal <70 (45) --Continue PT/OT efforts while inpatient. --Outpatient follow-up with neurology, Dr. Leonie Man in 4 weeks --Loop recorder placement per EP; at time of discharge --Pending insurance authorization/Bed for CIR  Right hand hematoma Patient underwent cerebral angiogram via right  radial artery catheterization on 7/5 with resultant right dorsal hand hematoma.  Left upper extremity arterial duplex negative for pseudoaneurysm.  Seen by vascular surgery with no focal hematoma to drain as this is diffuse interstitial and intramuscular edema. --Continue PT/OT efforts with hand ROM  Right middle lobe nodule Incidental finding of a 4 mm right middle lobe peripheral nodular opacity.  Recommend repeat CT chest 12 months.  Elevated D-dimer D-dimer elevated 2.51 admission, bilateral duplex ultrasound lower extremities negative for DVT, CT angiogram chest negative for PE.  Essential hypertension BP 140/91 this morning. --Amlodipine 5 mg p.o. daily --Hydralazine 25 mg p.o. twice daily --Metoprolol succinate 100 mg p.o. daily  Depression:  --Venlafaxine 75 mg p.o. daily --Wellbutrin 300 mg p.o. daily  Anxiety: Lorazepam 1 mg twice daily as needed  Migraine headaches: Topamax 150 mg p.o. nightly, sumatriptan as needed   DVT prophylaxis: enoxaparin (LOVENOX) injection 40 mg Start: 06/02/21 1000   Code Status: Full Code Family Communication: None present at bedside this morning.  Disposition Plan:  Level of care: Telemetry Medical Status is: Inpatient  Remains inpatient appropriate because:Ongoing diagnostic testing needed not appropriate for outpatient work up, Unsafe d/c plan, IV treatments appropriate due to intensity of illness or inability to take PO, and Inpatient level of care appropriate due to severity of illness  Dispo: The patient is from: Home              Anticipated d/c is to: CIR              Patient currently is medically stable to d/c.   Difficult to place patient No    Consultants:  Infectious disease Neurology Vascular surgery  Interventional radiology Cardiology  Procedures:  Lumbar puncture 6/30  Cerebral angiogram 7/5; no angiographic evidence of vasculitis, no intracranial stenosis/vasospasm, vascular occlusion, aneurysm, AVM, dural AV  fistula or other significant vascular abnormality TEE with contrast bubble study positive for large PFO  Antimicrobials:  Gentamicin 6/27 - 6/27 Flagyl 6/27 - 6/29 Vancomycin 6/27 - 6/29 Aztreonam 6/27 - 7/1  Subjective: Patient seen examined bedside, resting comfortably.  Lying in bed.  No family present.  Eating breakfast.  No questions or concerns at this time.  Reports good session with PT yesterday.  Awaiting CIR bed and insurance authorization. Patient denies headache, no chest pain, no palpitations, no shortness of breath, no abdominal pain.  No acute events overnight per nursing staff.  Objective: Vitals:   06/05/21 2135 06/06/21 0040 06/06/21 0609 06/06/21 0944  BP: (!) 128/97 125/88 127/78 109/80  Pulse:  93  94  Resp:  18    Temp:  98.1 F (36.7 C)    TempSrc:  Oral    SpO2:      Weight:      Height:        Intake/Output Summary (Last 24 hours) at 06/06/2021 1042 Last data filed at 06/06/2021 0600 Gross per 24 hour  Intake --  Output 800 ml  Net -800 ml   Filed Weights   05/24/21 0956 06/03/21 0712  Weight: 60.8 kg 60.8 kg    Examination:  General exam: Appears calm and comfortable  Respiratory system: Clear to auscultation. Respiratory effort normal.  On room air Cardiovascular system: S1 & S2 heard, RRR. No JVD, murmurs, rubs, gallops or clicks. No pedal edema. Gastrointestinal system: Abdomen is nondistended, soft and nontender. No organomegaly or masses felt. Normal bowel sounds heard. Central nervous system: Alert and oriented. No focal neurological deficits.  Sensation to light touch intact. Extremities: Moves all extremities independently, muscle strength 5/5 RLE, right hand limited by pain/hematoma, LUE 4/5 and LLE 4+/5.  Skin: Right hand swelling, otherwise no rashes, lesions or ulcers Psychiatry: Judgement and insight appear normal. Mood & affect appropriate.     Data Reviewed: I have personally reviewed following labs and imaging  studies  CBC: Recent Labs  Lab 05/30/21 1334 06/02/21 0826  WBC 6.1 10.0  HGB 10.4* 11.5*  HCT 31.6* 34.8*  MCV 89.5 89.5  PLT 284 027   Basic Metabolic Panel: Recent Labs  Lab 05/30/21 1334 06/01/21 0203 06/02/21 0826  NA 135 137 140  K 3.9 3.5 3.7  CL 109 109 111  CO2 21* 20* 20*  GLUCOSE 104* 146* 123*  BUN $Re'11 18 15  'aIQ$ CREATININE 0.66 0.58 0.66  CALCIUM 8.7* 8.9 9.1   GFR: Estimated Creatinine Clearance: 54.1 mL/min (by C-G formula based on SCr of 0.66 mg/dL). Liver Function Tests: No results for input(s): AST, ALT, ALKPHOS, BILITOT, PROT, ALBUMIN in the last 168 hours. No results for input(s): LIPASE, AMYLASE in the last 168 hours. No results for input(s): AMMONIA in the last 168 hours. Coagulation Profile: No results for input(s): INR, PROTIME in the last 168 hours. Cardiac Enzymes: No results for input(s): CKTOTAL, CKMB, CKMBINDEX, TROPONINI in the last 168 hours. BNP (last 3 results) No results for input(s): PROBNP in the last 8760 hours. HbA1C: No results for input(s): HGBA1C in the last 72 hours. CBG: Recent Labs  Lab 05/31/21 2213 06/02/21 1350 06/04/21 0730  GLUCAP 137* 169* 105*   Lipid Profile: No results for input(s): CHOL, HDL, LDLCALC, TRIG, CHOLHDL, LDLDIRECT in the last 72 hours. Thyroid Function Tests: No results for input(s): TSH, T4TOTAL, FREET4, T3FREE, THYROIDAB  in the last 72 hours. Anemia Panel: No results for input(s): VITAMINB12, FOLATE, FERRITIN, TIBC, IRON, RETICCTPCT in the last 72 hours. Sepsis Labs: No results for input(s): PROCALCITON, LATICACIDVEN in the last 168 hours.  Recent Results (from the past 240 hour(s))  CSF culture w Gram Stain     Status: None   Collection Time: 05/27/21  7:14 PM   Specimen: CSF; Cerebrospinal Fluid  Result Value Ref Range Status   Specimen Description CSF  Final   Special Requests NONE  Final   Gram Stain   Final    WBC PRESENT, PREDOMINANTLY MONONUCLEAR NO ORGANISMS SEEN CYTOSPIN  SMEAR    Culture   Final    NO GROWTH 3 DAYS Performed at [facility], [address] [place], [place] [address]    Report Status 05/31/2021 FINAL  Final  Culture, blood (routine x 2)     Status: None   Collection Time: 05/30/21  4:11 PM   Specimen: BLOOD RIGHT HAND  Result Value Ref Range Status   Specimen Description BLOOD RIGHT HAND  Final   Special Requests   Final    BOTTLES DRAWN AEROBIC AND ANAEROBIC Blood Culture results may not be optimal due to an inadequate volume of blood received in culture bottles   Culture   Final    NO GROWTH 5 DAYS Performed at [facility], [place] [address], [place], [place] [address]    Report Status 06/04/2021 FINAL  Final  Culture, blood (routine x 2)     Status: None   Collection Time: 05/30/21  4:11 PM   Specimen: BLOOD RIGHT HAND  Result Value Ref Range Status   Specimen Description BLOOD RIGHT HAND  Final   Special Requests   Final    BOTTLES DRAWN AEROBIC ONLY Blood Culture results may not be optimal due to an inadequate volume of blood received in culture bottles   Culture   Final    NO GROWTH 5 DAYS Performed at [facility], [place] [address], [place], [place] [address]    Report Status 06/04/2021 FINAL  Final         Radiology Studies: No results found.      Scheduled Meds:  amLODipine  5 mg Oral q AM   aspirin EC  81 mg Oral Daily   buPROPion  300 mg Oral Daily   calcium carbonate  1 tablet Oral Q breakfast   cholecalciferol  1,000 Units Oral Daily   clopidogrel  75 mg Oral Daily   enoxaparin (LOVENOX) injection  40 mg Subcutaneous Q24H   feeding supplement  237 mL Oral BID BM   hydrALAZINE  25 mg Oral BID   metoprolol succinate  100 mg Oral Daily   multivitamin with minerals  1 tablet Oral Daily   pantoprazole  40 mg Oral Daily   topiramate  150 mg Oral QHS   venlafaxine XR  75 mg Oral Q breakfast   vitamin B-12  1,000 mcg Oral Daily   Continuous Infusions:   LOS: 12 days    Time spent:  35 minutes spent on chart review, discussion with nursing staff, consultants, updating family and interview/physical exam; more than 50% of that time was spent in counseling and/or coordination of care.    Anitria Andon J [place] DO [facility] Available via Epic secure chat 7am-7pm After these hours, please refer to coverage provider listed on [email or web address] 06/06/2021, 10:42 AM

## 2021-06-07 LAB — BASIC METABOLIC PANEL
Anion gap: 8 (ref 5–15)
BUN: 16 mg/dL (ref 8–23)
CO2: 20 mmol/L — ABNORMAL LOW (ref 22–32)
Calcium: 8.7 mg/dL — ABNORMAL LOW (ref 8.9–10.3)
Chloride: 105 mmol/L (ref 98–111)
Creatinine, Ser: 0.76 mg/dL (ref 0.44–1.00)
GFR, Estimated: >60 mL/min (ref >60)
Glucose, Bld: 90 mg/dL (ref 70–99)
Potassium: 3.8 mmol/L (ref 3.5–5.1)
Sodium: 133 mmol/L — ABNORMAL LOW (ref 135–145)

## 2021-06-07 LAB — CBC
HCT: 30.5 % — ABNORMAL LOW (ref 36.0–46.0)
Hemoglobin: 9.9 g/dL — ABNORMAL LOW (ref 12.0–15.0)
MCH: 29.4 pg (ref 26.0–34.0)
MCHC: 32.5 g/dL (ref 30.0–36.0)
MCV: 90.5 fL (ref 80.0–100.0)
Platelets: 405 10*3/uL — ABNORMAL HIGH (ref 150–400)
RBC: 3.37 MIL/uL — ABNORMAL LOW (ref 3.87–5.11)
RDW: 15.7 % — ABNORMAL HIGH (ref 11.5–15.5)
WBC: 10.4 10*3/uL (ref 4.0–10.5)
nRBC: 0.3 % — ABNORMAL HIGH (ref 0.0–0.2)

## 2021-06-07 LAB — MAGNESIUM: Magnesium: 2.1 mg/dL (ref 1.7–2.4)

## 2021-06-07 NOTE — Progress Notes (Signed)
Occupational Therapy Treatment Patient Details Name: Michaela Rios MRN: [id number] DOB: 10-31-1951 Today's Date: 06/07/2021    History of present illness Pt is a 70 y.o. female admitted 05/24/21 with c/o weakness, fall due to loss of balance, poor PO intake. Workup for sepsis, unclear etiology. Pt with confusion, neuro consult for possible CNS infection. Brain MRI 6/29 showed R basal ganglia stroke in addition to accelerated small vessel disease. 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 PMH includes HTN, CKD 3, migraines, chronic LBP, prediabetes, anxiety, depression.   OT comments  Pt progressing to OOB ADL and mobility from chair to sink ~10' with maxA for multimodal cues using RW and set-by step direction with modA overall. Pt scoring 3/28 for the Short Blessed Test with deficits in time. Pt A/Ox4 and requires sequencing tasks. Pt performing grooming tasks x3 mins with modA for stability in standing; and sitting x2 mins for brushing teeth for fatigue and for safety. Pt would benefit from continued OT skilled services. OT following acutely.    Follow Up Recommendations  CIR    Equipment Recommendations  None recommended by OT    Recommendations for Other Services Rehab consult    Precautions / Restrictions Precautions Precautions: Fall Precaution Comments: urinary urgency/incontinence (has Depends in room) Restrictions Weight Bearing Restrictions: No       Mobility Bed Mobility Overal bed mobility: Needs Assistance Bed Mobility: Supine to Sit     Supine to sit: Mod assist;HOB elevated     General bed mobility comments: up in recliner    Transfers Overall transfer level: Needs assistance Equipment used: Rolling walker (2 wheeled) Transfers: Sit to/from [facility] Sit to Stand: Mod assist Stand pivot transfers: Mod assist       General transfer comment: Cues for sequencing and hand placement.     Balance Overall balance assessment: Needs assistance Sitting-balance support: Single extremity supported;Feet supported Sitting balance-Leahy Scale: Fair   Postural control: Right lateral lean;Posterior lean Standing balance support: Bilateral upper extremity supported;During functional activity Standing balance-Leahy Scale: Poor Standing balance comment: reliant on external support                           ADL either performed or assessed with clinical judgement   ADL Overall ADL's : Needs assistance/impaired     Grooming: Minimal assistance;Standing;Set up;Sitting Grooming Details (indicate cue type and reason): Standing at sink for grooming tasks x2 mins- able to wash face and sitting in recliner for brushing teeth due to posterior/R lateral lean in standing. Pt requiring increased time for task and prompts to add more toothpaste.                 Toilet Transfer: Moderate assistance;Ambulation;Stand-pivot;Grab bars Toilet Transfer Details (indicate cue type and reason): Pt with R lateral lean and posterior lean with cues for proper hand placement.         Functional mobility during ADLs: Moderate assistance;Rolling walker;Cueing for safety;Cueing for sequencing General ADL Comments: Pt's session focused on ADL at sink to challenge balance and cognition. Pt with increased ability to care for self since eval, but modA overall for stability and mobility with RW.     Vision   Vision Assessment?: Yes Eye Alignment: Within Functional Limits Ocular Range of Motion: Within Functional Limits Alignment/Gaze Preference: Within Defined Limits   Perception     Praxis      Cognition Arousal/Alertness: Awake/alert Behavior During Therapy: Flat  affect Overall Cognitive Status: Impaired/Different from baseline Area of Impairment: Attention;Following commands;Safety/judgement;Awareness;Problem solving;Memory                   Current Attention Level:  Sustained Memory: Decreased short-term memory Following Commands: Follows one step commands consistently;Follows multi-step commands with increased time;Follows multi-step commands inconsistently Safety/Judgement: Decreased awareness of safety;Decreased awareness of deficits Awareness: Emergent Problem Solving: Slow processing;Requires verbal cues;Requires tactile cues;Difficulty sequencing;Decreased initiation General Comments: Short Blessed Test: Incorrect time within 1 hour 15 mins and cues to state months of year in reverse order. Pt scoring 3/28 for cues with memory and sequencing. Pt stating "how would I know what time it is without looking at clock?" OTR explained pt was aware that is was the afternoon and required no cues for selecting a time. Pt statin "3:45pm and it was 2:27pm."        Exercises     Shoulder Instructions       General Comments No dizziness at this time.    Pertinent Vitals/ Pain       Pain Assessment: No/denies pain Pain Intervention(s): Monitored during session  Home Living                                          Prior Functioning/Environment              Frequency  Min 2X/week        Progress Toward Goals  OT Goals(current goals can now be found in the care plan section)  Progress towards OT goals: Progressing toward goals  Acute Rehab OT Goals Patient Stated Goal: Ready to go to rehab OT Goal Formulation: With patient/family Time For Goal Achievement: 06/11/21 Potential to Achieve Goals: Good ADL Goals Pt Will Perform Grooming: with supervision;standing Pt Will Perform Lower Body Bathing: with min guard assist;sitting/lateral leans;sit to/from stand Pt Will Perform Lower Body Dressing: with min guard assist;sitting/lateral leans;sit to/from stand Pt Will Transfer to Toilet: with supervision;ambulating Pt Will Perform Toileting - Clothing Manipulation and hygiene: with min guard assist;sitting/lateral leans;sit  to/from stand Pt/caregiver will Perform Home Exercise Program: Increased strength;Both right and left upper extremity;Independently;With written HEP provided  Plan Discharge plan remains appropriate;Frequency remains appropriate    Co-evaluation                 AM-PAC OT "6 Clicks" Daily Activity     Outcome Measure   Help from another person eating meals?: None Help from another person taking care of personal grooming?: A Little Help from another person toileting, which includes using toliet, bedpan, or urinal?: A Little Help from another person bathing (including washing, rinsing, drying)?: A Lot Help from another person to put on and taking off regular upper body clothing?: A Little Help from another person to put on and taking off regular lower body clothing?: A Little 6 Click Score: 18    End of Session Equipment Utilized During Treatment: Gait belt;Rolling walker  OT Visit Diagnosis: Unsteadiness on feet (R26.81);Other abnormalities of gait and mobility (R26.89);Muscle weakness (generalized) (M62.81)   Activity Tolerance Patient tolerated treatment well;Patient limited by fatigue   Patient Left in chair;with call bell/phone within reach;Other (comment) (pt to use call bell; no new batteries for chair alarm to stop beeping.)   Nurse Communication Mobility status        Time: 1402-1440 OT Time Calculation (min): 38 min  Charges: OT  General Charges $OT Visit: 1 Visit OT Treatments $Self Care/Home Management : 8-22 mins $Therapeutic Activity: 8-22 mins $Cognitive Funtion inital: Initial 15 mins  Jefferey Pica, OTR/L Acute Rehabilitation Services Pager: [phone] Office: [phone]    Shana Younge C 06/07/2021, 2:56 PM

## 2021-06-07 NOTE — Progress Notes (Signed)
PROGRESS NOTE    Nandika Stetzer Backhaus  WCB:[id number] DOB: [birth year] DOA: 05/24/2021 PCP: Binnie Rail, MD    Brief Narrative:  Michaela Rios is a 70 year old female with past medical history significant for essential hypertension, prediabetes, depression, CKD stage III, history of peptic ulcer disease, chronic low back pain, migraine headache who presented to [facility] on 6/27 with generalized weakness, loss of balance.  Patient reports she was going to the bathroom lost her balance and fell.  She has been having some chills.  Patient also reports poor oral intake over the last few days with increased urinary frequency but without dysuria.  No nausea/vomiting/diarrhea, no chest pain, no palpitations, no abdominal pain.  In the ED, BP 164/116, HR 141, RR 17, temperature 101.4 F, SPO2 96% on room air.  Sodium 139, potassium 2.8, chloride 105, CO2 22, glucose 121, BUN 9, creatinine 0.77.  AST 24, ALT 16, total bilirubin 0.5.  WBC 8.6, hemoglobin 11.6, platelets 243.  Lactic acid 2.1.  Urinalysis negative.  COVID-19 PCR negative.  Influenza A/B PCR negative.  D-dimer elevated 2.51.  Chest x-ray with no acute cardiopulmonary disease process.  CT renal stone study with no acute obstructing urinary tract or ureteral calculus, no uropathy or hydronephrosis, no acute intra-abdominal or pelvic finding.  CT angiogram chest with no evidence of acute pulmonary embolism, 4 mm right middle lobe peripheral nodular opacity.   Assessment & Plan:   Principal Problem:   Sepsis ([place]) Active Problems:   Anxiety   Depression   Essential hypertension   Prediabetes   Hypokalemia   Tachycardia   Cerebral thrombosis with cerebral infarction   SIRS Fever of unknown origin Left greater than right brain abnormal signal in our MRI Patient presenting to the ED with weakness, fever with temperature 101.4 F, tachycardia, tachypnea and mild elevation of lactic acid with associated confusion.  Chest x-ray negative for  acute cardiopulmonary disease process.  Urinalysis negative for infection.  COVID/influenza PCR negative.  Blood cultures and urine culture with no growth.  CT abdomen/pelvis with no acute intra-abdominal/pelvic process.  Underwent lumbar puncture on 6/30.  Patient was initially started on the broad-spectrum antibiotics with gentamicin, vancomycin, Flagyl, aztreonam.  Infectious disease was initially consulted, antibiotics were discontinued with negative infectious work-up.  ESR 37, CRP 6.8.  RPR negative, VR DL nonreactive.  ANA positive, double-stranded DNA negative.  ANCA negative. ACE level within normal limits. Rheumatoid factor negative.  B12 254, B1 level normal, TSH within normal limits.  Acute hepatitis panel negative.  Complement levels within normal limits. Cerebral angiogram 7/5; no angiographic evidence of vasculitis, no intracranial stenosis/vasospasm, vascular occlusion, aneurysm, AVM, dural AV fistula or other significant vascular abnormality.  CT head with new multifocal hypoattenuation basal ganglia and thalamus bilaterally of indeterminate etiology, no mass-effect to suggest tumor.  MR brain with right basal ganglia infarct, hyperdensity and mild enlargement of the left head of the caudate, left thalamus and small amount of hyperintensity in the right medial thalamus with recommendation of postcontrast MRI for further evaluation.  MRI with contrast with mild largely vascular perivascular appearing enhancement in the areas of the signal abnormality in the deep gray nuclei as well as small amount of leptomeningeal enhancement over the right and possibly left parietal convexities, no solid mass enhancing concerning for cryptococcus, vasculitis, neoplasm, angiocentric lymphoma versus granulomatosis, autoimmune or inflammatory condition.  Repeat follow-up MR brain with and without contrast 7/6 with numerous small acute bilateral cerebral and cerebellar infarcts compatible with emboli, expected  evolution of right basal ganglia infarct with decreased T2 signal abnormality and enhancement in the left greater than right deep gray matter and deep white matter tracts.  Completed 3-day course of IV cell Medrol 1 g every 24 hours on 7/5.  CSF cytology negative for malignant cells, CSF oligoclonal bands negative. --Neurology following, appreciate assistance --Remains afebrile --CSF [facility] autoimmune/paraneoplastic panel: Pending --Repeat MRI in 1 month with Dr. 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  Initially thought to be infectious, chest x-ray, CT angiogram chest, and urinalysis unrevealing.  Brain with right basal ganglia infarct and hyperintensity and mild enlargement of the left head of the caudate, left thalamus and small amount of hyperintensity right medial thalamus.  Hemoglobin A1c 5.9.  LDL 45.  TTE with LVEF 60 to 65%, no LV regional wall motion normalities, grade 1 diastolic dysfunction, mild MR, no aortic valve stenosis, IVC normal in size.  TEE EF 60 to 65%, no left atrial left atrial appendage thrombus, bubble study positive for shunting suggestive of a large patent foramen ovale.  Neurology believes her embolic appearing stroke secondary to cerebral angiogram procedure complication versus cryptogenic, and due to low rope score, no need for closure of PFO per Dr. Leonie Man and Dr. Erlinda Hong.  Neurology consulted cardiology for loop recorder placement to evaluate for occult atrial fibrillation. --Aspirin 81 mg p.o. daily --Plavix 75 mg p.o. daily --No statin as LDL below goal <70 (45) --Continue PT/OT efforts while inpatient. --Outpatient follow-up with neurology, Dr. Leonie Man in 4 weeks --Loop recorder placement per EP; at time of discharge --Pending insurance authorization/Bed for CIR  Right hand hematoma Patient underwent cerebral  angiogram via right radial artery catheterization on 7/5 with resultant right dorsal hand hematoma.  Left upper extremity arterial duplex negative for pseudoaneurysm.  Seen by vascular surgery with no focal hematoma to drain as this is diffuse interstitial and intramuscular edema. --Continue PT/OT efforts with hand ROM  Right middle lobe nodule Incidental finding of a 4 mm right middle lobe peripheral nodular opacity.  Recommend repeat CT chest 12 months.  Elevated D-dimer D-dimer elevated 2.51 admission, bilateral duplex ultrasound lower extremities negative for DVT, CT angiogram chest negative for PE.  Essential hypertension BP 140/91 this morning. --Amlodipine 5 mg p.o. daily --Hydralazine 25 mg p.o. twice daily --Metoprolol succinate 100 mg p.o. daily  Depression:  --Venlafaxine 75 mg p.o. daily --Wellbutrin 300 mg p.o. daily  Anxiety: Lorazepam 1 mg twice daily as needed  Migraine headaches: Topamax 150 mg p.o. nightly, sumatriptan as needed   DVT prophylaxis: enoxaparin (LOVENOX) injection 40 mg Start: 06/02/21 1000   Code Status: Full Code Family Communication: None present at bedside this morning.  Disposition Plan:  Level of care: Telemetry Medical Status is: Inpatient  Remains inpatient appropriate because:Unsafe d/c plan  Dispo: The patient is from: Home              Anticipated d/c is to: CIR              Patient currently is medically stable to d/c.   Difficult to place patient No    Consultants:  Infectious disease Neurology Vascular surgery  Interventional radiology Cardiology  Procedures:  Lumbar puncture 6/30 Cerebral angiogram 7/5; no angiographic evidence of vasculitis, no intracranial stenosis/vasospasm, vascular occlusion, aneurysm, AVM, dural AV fistula or other significant vascular abnormality TEE with contrast bubble study positive for large PFO  Antimicrobials:  Gentamicin 6/27 - 6/27 Flagyl 6/27 - 6/29 Vancomycin 6/27 - 6/29 Aztreonam  6/27 - 7/1  Subjective: Patient seen examined bedside, resting comfortably.  Lying in bed.  No family present.  Eating breakfast.  No questions or concerns at this time.  Reports good session with PT on Saturday, did not work with PT yesterday; hopeful for repeat therapy fit session today.  Awaiting CIR bed and insurance authorization. Patient denies headache, no chest pain, no palpitations, no shortness of breath, no abdominal pain.  No acute events overnight per nursing staff.  Objective: Vitals:   06/06/21 1925 06/06/21 2308 06/07/21 0311 06/07/21 0621  BP: 130/82 (!) 140/97 (!) 146/99 (!) 125/96  Pulse: 93 91 96   Resp: _0 Temp: 98.7 F (37.1 C) 98.4 F (36.9 C) 99 F (37.2 C)   TempSrc: Oral Oral Oral   SpO2: 96% 98% 94%   Weight:      Height:       No intake or output data in the 24 hours ending 06/07/21 1035  Filed Weights   05/24/21 0956 06/03/21 0712  Weight: 60.8 kg 60.8 kg    Examination:  General exam: Appears calm and comfortable  Respiratory system: Clear to auscultation. Respiratory effort normal.  On room air Cardiovascular system: S1 & S2 heard, RRR. No JVD, murmurs, rubs, gallops or clicks. No pedal edema. Gastrointestinal system: Abdomen is nondistended, soft and nontender. No organomegaly or masses felt. Normal bowel sounds heard. Central nervous system: Alert and oriented. No focal neurological deficits.  Sensation to light touch intact. Extremities: Moves all extremities independently, muscle strength 5/5 RLE, right hand limited by pain/hematoma, LUE 4/5 and LLE 4+/5.  Skin: Right hand swelling, otherwise no rashes, lesions or ulcers Psychiatry: Judgement and insight appear normal. Mood & affect appropriate.     Data Reviewed: I have personally reviewed following labs and imaging studies  CBC: Recent Labs  Lab 06/02/21 0826 06/07/21 0140  WBC 10.0 10.4  HGB 11.5* 9.9*  HCT 34.8* 30.5*  MCV 89.5 90.5  PLT 348 981*   Basic Metabolic  Panel: Recent Labs  Lab 06/01/21 0203 06/02/21 0826 06/07/21 0140  NA 137 140 133*  K 3.5 3.7 3.8  CL 109 111 105  CO2 20* 20* 20*  GLUCOSE 146* 123* 90  BUN _1 CREATININE 0.58 0.66 0.76  CALCIUM 8.9 9.1 8.7*  MG  --   --  2.1   GFR: Estimated Creatinine Clearance: 54.1 mL/min (by C-G formula based on SCr of 0.76 mg/dL). Liver Function Tests: No results for input(s): AST, ALT, ALKPHOS, BILITOT, PROT, ALBUMIN in the last 168 hours. No results for input(s): LIPASE, AMYLASE in the last 168 hours. No results for input(s): AMMONIA in the last 168 hours. Coagulation Profile: No results for input(s): INR, PROTIME in the last 168 hours. Cardiac Enzymes: No results for input(s): CKTOTAL, CKMB, CKMBINDEX, TROPONINI in the last 168 hours. BNP (last 3 results) No results for input(s): PROBNP in the last 8760 hours. HbA1C: No results for input(s): HGBA1C in the last 72 hours. CBG: Recent Labs  Lab 05/31/21 2213 06/02/21 1350 06/04/21 0730  GLUCAP 137* 169* 105*   Lipid Profile: No results for input(s): CHOL, HDL, LDLCALC, TRIG, CHOLHDL, LDLDIRECT in the last 72 hours. Thyroid Function Tests: No results for input(s): TSH, T4TOTAL, FREET4, T3FREE, THYROIDAB in the last 72 hours. Anemia Panel: No results for input(s): VITAMINB12, FOLATE, FERRITIN, TIBC, IRON, RETICCTPCT in the last 72  hours. Sepsis Labs: No results for input(s): PROCALCITON, LATICACIDVEN in the last 168 hours.  Recent Results (from the past 240 hour(s))  Culture, blood (routine x 2)     Status: None   Collection Time: 05/30/21  4:11 PM   Specimen: BLOOD RIGHT HAND  Result Value Ref Range Status   Specimen Description BLOOD RIGHT HAND  Final   Special Requests   Final    BOTTLES DRAWN AEROBIC AND ANAEROBIC Blood Culture results may not be optimal due to an inadequate volume of blood received in culture bottles   Culture   Final    NO GROWTH 5 DAYS Performed at [facility], [place] [address]  [place], [place] [address]    Report Status 06/04/2021 FINAL  Final  Culture, blood (routine x 2)     Status: None   Collection Time: 05/30/21  4:11 PM   Specimen: BLOOD RIGHT HAND  Result Value Ref Range Status   Specimen Description BLOOD RIGHT HAND  Final   Special Requests   Final    BOTTLES DRAWN AEROBIC ONLY Blood Culture results may not be optimal due to an inadequate volume of blood received in culture bottles   Culture   Final    NO GROWTH 5 DAYS Performed at [facility], [place] [address], [place], [place] [address]    Report Status 06/04/2021 FINAL  Final         Radiology Studies: No results found.      Scheduled Meds:  amLODipine  5 mg Oral q AM   aspirin EC  81 mg Oral Daily   buPROPion  300 mg Oral Daily   calcium carbonate  1 tablet Oral Q breakfast   cholecalciferol  1,000 Units Oral Daily   clopidogrel  75 mg Oral Daily   enoxaparin (LOVENOX) injection  40 mg Subcutaneous Q24H   feeding supplement  237 mL Oral BID BM   hydrALAZINE  25 mg Oral BID   metoprolol succinate  100 mg Oral Daily   multivitamin with minerals  1 tablet Oral Daily   pantoprazole  40 mg Oral Daily   topiramate  150 mg Oral QHS   venlafaxine XR  75 mg Oral Q breakfast   vitamin B-12  1,000 mcg Oral Daily   Continuous Infusions:   LOS: 13 days    Time spent: 35 minutes spent on chart review, discussion with nursing staff, consultants, updating family and interview/physical exam; more than 50% of that time was spent in counseling and/or coordination of care.    Montae Stager J [place] DO [facility] Available via Epic secure chat 7am-7pm After these hours, please refer to coverage provider listed on [email or web address] 06/07/2021, 10:35 AM

## 2021-06-07 NOTE — Progress Notes (Signed)
Inpatient Rehab Admissions Coordinator:   I do not yet have insurance auth or a bed for Pt. On CIR today. I sent additional clinicals to [facility] for CIR appeal this morning. They requested an updated OT note, so I contacted acute rehab to request she be seen by OT today.  I will follow for potential admit pending insurance auth and bed availability.    Clemens Catholic, [place], [place] Admissions Coordinator  [phone] ([place]) [phone] (office)

## 2021-06-07 NOTE — Plan of Care (Signed)
  Problem: Education: Goal: Knowledge of General Education information will improve Description Including pain rating scale, medication(s)/side effects and non-pharmacologic comfort measures Outcome: Progressing   

## 2021-06-07 NOTE — Progress Notes (Signed)
Physical Therapy Treatment Patient Details Name: Michaela Rios MRN: [id number] DOB: 1951-09-01 Today's Date: 06/07/2021    History of Present Illness Pt is a 70 y.o. female admitted 05/24/21 with c/o weakness, fall due to loss of balance, poor PO intake. Workup for sepsis, unclear etiology. Pt with confusion, neuro consult for possible CNS infection. Brain MRI 6/29 showed R basal ganglia stroke in addition to accelerated small vessel disease. 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 PMH includes HTN, CKD 3, migraines, chronic LBP, prediabetes, anxiety, depression.    PT Comments    Pt received in bed, agreeable to participation in therapy. Husband present in room. Pt required mod assist bed mobility, transfers, and ambulation 30' with RW. She requires continuous verbal cues for sequencing during mobility skill. Posterior and right lateral lean in sitting and standing requiring assist to maintain balance. Pt in recliner with feet elevated at end of session.    Follow Up Recommendations  CIR;Supervision/Assistance - 24 hour     Equipment Recommendations  None recommended by PT    Recommendations for Other Services       Precautions / Restrictions Precautions Precautions: Fall Precaution Comments: urinary urgency/incontinence (has Depends in room)    Mobility  Bed Mobility Overal bed mobility: Needs Assistance Bed Mobility: Supine to Sit     Supine to sit: Mod assist;HOB elevated     General bed mobility comments: cues for sequencing, increased time, assist with BLE and trunk    Transfers Overall transfer level: Needs assistance Equipment used: Rolling walker (2 wheeled) Transfers: Sit to/from [facility] Sit to Stand: Mod assist Stand pivot transfers: Mod assist       General transfer comment: cues for hand placement and sequencing, assist to power up and stabilize balance. Retropulsive with initial  stance requiring assist with anterior translation of weight.  Ambulation/Gait Ambulation/Gait assistance: Mod assist Gait Distance (Feet): 30 Feet Assistive device: Rolling walker (2 wheeled) Gait Pattern/deviations: Decreased step length - left;Trunk flexed;Step-through pattern;Staggering right Gait velocity: decreased Gait velocity interpretation: <1.8 ft/sec, indicate of risk for recurrent falls General Gait Details: assist to maintain balance and manage RW. Continuous verbal cues for sequencing. Increased processing time when cueing for sequencing.   Stairs             Wheelchair Mobility    Modified Rankin (Stroke Patients Only) Modified Rankin (Stroke Patients Only) Pre-Morbid Rankin Score: No significant disability Modified Rankin: Moderately severe disability     Balance Overall balance assessment: Needs assistance Sitting-balance support: Single extremity supported;Feet supported Sitting balance-Leahy Scale: Fair   Postural control: Right lateral lean;Posterior lean Standing balance support: Bilateral upper extremity supported;During functional activity Standing balance-Leahy Scale: Poor Standing balance comment: reliant on external support                            Cognition Arousal/Alertness: Awake/alert Behavior During Therapy: Flat affect Overall Cognitive Status: Impaired/Different from baseline Area of Impairment: Attention;Following commands;Safety/judgement;Awareness;Problem solving;Memory                   Current Attention Level: Sustained Memory: Decreased short-term memory Following Commands: Follows one step commands consistently;Follows multi-step commands with increased time;Follows multi-step commands inconsistently Safety/Judgement: Decreased awareness of safety;Decreased awareness of deficits Awareness: Emergent Problem Solving: Slow processing;Requires verbal cues;Requires tactile cues;Difficulty sequencing;Decreased  initiation General Comments: easily distracted, continuous multi modal cues during session      Exercises  General Comments General comments (skin integrity, edema, etc.): Pt with c/o mild dizziness with position changes. BP stable.      Pertinent Vitals/Pain Pain Assessment: No/denies pain    Home Living                      Prior Function            PT Goals (current goals can now be found in the care plan section) Acute Rehab PT Goals Patient Stated Goal: Ready to go to rehab Progress towards PT goals: Progressing toward goals    Frequency    Min 4X/week      PT Plan Current plan remains appropriate    Co-evaluation              AM-PAC PT "6 Clicks" Mobility   Outcome Measure  Help needed turning from your back to your side while in a flat bed without using bedrails?: A Little Help needed moving from lying on your back to sitting on the side of a flat bed without using bedrails?: A Lot Help needed moving to and from a bed to a chair (including a wheelchair)?: A Lot Help needed standing up from a chair using your arms (e.g., wheelchair or bedside chair)?: A Lot Help needed to walk in hospital room?: A Lot Help needed climbing 3-5 steps with a railing? : Total 6 Click Score: 12    End of Session Equipment Utilized During Treatment: Gait belt Activity Tolerance: Patient tolerated treatment well Patient left: in chair;with call bell/phone within reach;with chair alarm set;with family/visitor present Nurse Communication: Mobility status PT Visit Diagnosis: Other abnormalities of gait and mobility (R26.89);Muscle weakness (generalized) (M62.81);Other symptoms and signs involving the nervous system (R29.898)     Time: 8299-3716 PT Time Calculation (min) (ACUTE ONLY): 27 min  Charges:  $Gait Training: 8-22 mins $Therapeutic Activity: 8-22 mins                     [name], PT  Office # [phone] Pager [phone]    [name] 06/07/2021, 12:15 PM

## 2021-06-08 ENCOUNTER — Inpatient Hospital Stay (HOSPITAL_COMMUNITY)
Admission: RE | Admit: 2021-06-08 | Discharge: 2021-07-01 | DRG: 057 | Disposition: A | Discharge status: Home / Self Care | Payer: Medicare HMO | Source: Intra-hospital | Attending: Physical Medicine & Rehabilitation | Admitting: Physical Medicine & Rehabilitation

## 2021-06-08 ENCOUNTER — Encounter (HOSPITAL_COMMUNITY): Payer: Self-pay | Admitting: Physical Medicine & Rehabilitation

## 2021-06-08 ENCOUNTER — Other Ambulatory Visit: Payer: Self-pay

## 2021-06-08 ENCOUNTER — Inpatient Hospital Stay (HOSPITAL_COMMUNITY)
Admission: EM | Disposition: A | Discharge status: Rehabilitation Facility | Payer: Self-pay | Source: Home / Self Care | Attending: Internal Medicine

## 2021-06-08 DIAGNOSIS — Z801 Family history of malignant neoplasm of trachea, bronchus and lung: Secondary | ICD-10-CM | POA: Diagnosis not present

## 2021-06-08 DIAGNOSIS — N1832 Chronic kidney disease, stage 3b: Secondary | ICD-10-CM | POA: Diagnosis not present

## 2021-06-08 DIAGNOSIS — M545 Low back pain, unspecified: Secondary | ICD-10-CM | POA: Diagnosis present

## 2021-06-08 DIAGNOSIS — F32A Depression, unspecified: Secondary | ICD-10-CM | POA: Diagnosis present

## 2021-06-08 DIAGNOSIS — I69398 Other sequelae of cerebral infarction: Secondary | ICD-10-CM | POA: Diagnosis not present

## 2021-06-08 DIAGNOSIS — I10 Essential (primary) hypertension: Secondary | ICD-10-CM | POA: Diagnosis present

## 2021-06-08 DIAGNOSIS — M6281 Muscle weakness (generalized): Secondary | ICD-10-CM | POA: Diagnosis not present

## 2021-06-08 DIAGNOSIS — D631 Anemia in chronic kidney disease: Secondary | ICD-10-CM | POA: Diagnosis not present

## 2021-06-08 DIAGNOSIS — I6381 Other cerebral infarction due to occlusion or stenosis of small artery: Secondary | ICD-10-CM | POA: Diagnosis not present

## 2021-06-08 DIAGNOSIS — G4700 Insomnia, unspecified: Secondary | ICD-10-CM | POA: Diagnosis present

## 2021-06-08 DIAGNOSIS — F3289 Other specified depressive episodes: Secondary | ICD-10-CM | POA: Diagnosis not present

## 2021-06-08 DIAGNOSIS — R630 Anorexia: Secondary | ICD-10-CM | POA: Diagnosis present

## 2021-06-08 DIAGNOSIS — E876 Hypokalemia: Secondary | ICD-10-CM | POA: Diagnosis present

## 2021-06-08 DIAGNOSIS — I129 Hypertensive chronic kidney disease with stage 1 through stage 4 chronic kidney disease, or unspecified chronic kidney disease: Secondary | ICD-10-CM | POA: Diagnosis not present

## 2021-06-08 DIAGNOSIS — R0989 Other specified symptoms and signs involving the circulatory and respiratory systems: Secondary | ICD-10-CM

## 2021-06-08 DIAGNOSIS — R32 Unspecified urinary incontinence: Secondary | ICD-10-CM | POA: Diagnosis not present

## 2021-06-08 DIAGNOSIS — G43009 Migraine without aura, not intractable, without status migrainosus: Secondary | ICD-10-CM | POA: Diagnosis not present

## 2021-06-08 DIAGNOSIS — Z8711 Personal history of peptic ulcer disease: Secondary | ICD-10-CM | POA: Diagnosis not present

## 2021-06-08 DIAGNOSIS — Q211 Atrial septal defect: Secondary | ICD-10-CM

## 2021-06-08 DIAGNOSIS — R911 Solitary pulmonary nodule: Secondary | ICD-10-CM | POA: Diagnosis present

## 2021-06-08 DIAGNOSIS — G43909 Migraine, unspecified, not intractable, without status migrainosus: Secondary | ICD-10-CM | POA: Diagnosis not present

## 2021-06-08 DIAGNOSIS — I6330 Cerebral infarction due to thrombosis of unspecified cerebral artery: Secondary | ICD-10-CM

## 2021-06-08 DIAGNOSIS — I639 Cerebral infarction, unspecified: Secondary | ICD-10-CM | POA: Diagnosis present

## 2021-06-08 DIAGNOSIS — Z8042 Family history of malignant neoplasm of prostate: Secondary | ICD-10-CM

## 2021-06-08 DIAGNOSIS — R4189 Other symptoms and signs involving cognitive functions and awareness: Secondary | ICD-10-CM | POA: Diagnosis present

## 2021-06-08 DIAGNOSIS — E871 Hypo-osmolality and hyponatremia: Secondary | ICD-10-CM | POA: Diagnosis not present

## 2021-06-08 DIAGNOSIS — R000 Tachycardia, unspecified: Secondary | ICD-10-CM | POA: Diagnosis not present

## 2021-06-08 DIAGNOSIS — R7303 Prediabetes: Secondary | ICD-10-CM | POA: Diagnosis not present

## 2021-06-08 DIAGNOSIS — G8929 Other chronic pain: Secondary | ICD-10-CM | POA: Diagnosis present

## 2021-06-08 DIAGNOSIS — Z8614 Personal history of Methicillin resistant Staphylococcus aureus infection: Secondary | ICD-10-CM

## 2021-06-08 DIAGNOSIS — R350 Frequency of micturition: Secondary | ICD-10-CM

## 2021-06-08 DIAGNOSIS — F419 Anxiety disorder, unspecified: Secondary | ICD-10-CM | POA: Diagnosis not present

## 2021-06-08 DIAGNOSIS — N183 Chronic kidney disease, stage 3 unspecified: Secondary | ICD-10-CM | POA: Diagnosis not present

## 2021-06-08 DIAGNOSIS — A419 Sepsis, unspecified organism: Secondary | ICD-10-CM | POA: Diagnosis not present

## 2021-06-08 DIAGNOSIS — Z87440 Personal history of urinary (tract) infections: Secondary | ICD-10-CM | POA: Diagnosis not present

## 2021-06-08 DIAGNOSIS — R69 Illness, unspecified: Secondary | ICD-10-CM | POA: Diagnosis not present

## 2021-06-08 DIAGNOSIS — R413 Other amnesia: Secondary | ICD-10-CM | POA: Diagnosis present

## 2021-06-08 DIAGNOSIS — I6359 Cerebral infarction due to unspecified occlusion or stenosis of other cerebral artery: Secondary | ICD-10-CM | POA: Diagnosis not present

## 2021-06-08 DIAGNOSIS — D649 Anemia, unspecified: Secondary | ICD-10-CM | POA: Diagnosis not present

## 2021-06-08 HISTORY — PX: LOOP RECORDER INSERTION: EP1214

## 2021-06-08 SURGERY — LOOP RECORDER INSERTION

## 2021-06-08 MED ORDER — DIPHENHYDRAMINE HCL 12.5 MG/5ML PO ELIX: 12.5000 mg | ORAL_SOLUTION | Freq: Four times a day (QID) | ORAL | Status: DC | PRN

## 2021-06-08 MED ORDER — HYDRALAZINE HCL 25 MG PO TABS
25.0000 mg | ORAL_TABLET | Freq: Two times a day (BID) | ORAL | Status: DC
  Administered 2021-06-08 – 2021-07-01 (×46): 25 mg via ORAL
  Filled 2021-06-08 (×46): qty 1

## 2021-06-08 MED ORDER — VITAMIN B-12 1000 MCG PO TABS
1000.0000 ug | ORAL_TABLET | Freq: Every day | ORAL | Status: DC
  Administered 2021-06-09 – 2021-07-01 (×23): 1000 ug via ORAL
  Filled 2021-06-08 (×23): qty 1

## 2021-06-08 MED ORDER — ASPIRIN 81 MG PO TBEC: 81.0000 mg | DELAYED_RELEASE_TABLET | Freq: Every day | ORAL | 11 refills | Status: AC

## 2021-06-08 MED ORDER — ACETAMINOPHEN 325 MG PO TABS
325.0000 mg | ORAL_TABLET | ORAL | Status: DC | PRN
  Administered 2021-06-08 – 2021-06-15 (×3): 650 mg via ORAL
  Filled 2021-06-08 (×3): qty 2

## 2021-06-08 MED ORDER — ALUM & MAG HYDROXIDE-SIMETH 200-200-20 MG/5ML PO SUSP: 30.0000 mL | ORAL | Status: DC | PRN

## 2021-06-08 MED ORDER — PROCHLORPERAZINE MALEATE 5 MG PO TABS: 5.0000 mg | ORAL_TABLET | Freq: Four times a day (QID) | ORAL | Status: DC | PRN

## 2021-06-08 MED ORDER — CALCIUM CARBONATE 1250 (500 CA) MG PO TABS: 1.0000 | ORAL_TABLET | Freq: Every day | ORAL | Status: AC

## 2021-06-08 MED ORDER — ASPIRIN EC 81 MG PO TBEC
81.0000 mg | DELAYED_RELEASE_TABLET | Freq: Every day | ORAL | Status: DC
  Administered 2021-06-09 – 2021-07-01 (×23): 81 mg via ORAL
  Filled 2021-06-08 (×23): qty 1

## 2021-06-08 MED ORDER — HYDROCODONE-ACETAMINOPHEN 7.5-325 MG PO TABS
1.0000 | ORAL_TABLET | Freq: Four times a day (QID) | ORAL | Status: DC | PRN
  Administered 2021-06-10 – 2021-06-22 (×2): 1 via ORAL
  Filled 2021-06-08 (×2): qty 1

## 2021-06-08 MED ORDER — TIZANIDINE HCL 4 MG PO TABS
4.0000 mg | ORAL_TABLET | Freq: Two times a day (BID) | ORAL | Status: DC | PRN
  Administered 2021-06-29: 4 mg via ORAL
  Filled 2021-06-08: qty 1

## 2021-06-08 MED ORDER — FLEET ENEMA 7-19 GM/118ML RE ENEM: 1.0000 | ENEMA | Freq: Once | RECTAL | Status: DC | PRN

## 2021-06-08 MED ORDER — LIDOCAINE HCL URETHRAL/MUCOSAL 2 % EX GEL: CUTANEOUS | Status: DC | PRN

## 2021-06-08 MED ORDER — GUAIFENESIN-DM 100-10 MG/5ML PO SYRP: 5.0000 mL | ORAL_SOLUTION | Freq: Four times a day (QID) | ORAL | Status: DC | PRN

## 2021-06-08 MED ORDER — SUMATRIPTAN SUCCINATE 100 MG PO TABS
100.0000 mg | ORAL_TABLET | ORAL | Status: DC | PRN
  Filled 2021-06-08: qty 1

## 2021-06-08 MED ORDER — CYANOCOBALAMIN 1000 MCG PO TABS: 1000.0000 ug | ORAL_TABLET | Freq: Every day | ORAL | Status: AC

## 2021-06-08 MED ORDER — BUPROPION HCL ER (XL) 300 MG PO TB24
300.0000 mg | ORAL_TABLET | Freq: Every day | ORAL | Status: DC
  Administered 2021-06-09 – 2021-07-01 (×23): 300 mg via ORAL
  Filled 2021-06-08 (×23): qty 1

## 2021-06-08 MED ORDER — POLYETHYLENE GLYCOL 3350 17 G PO PACK
17.0000 g | PACK | Freq: Every day | ORAL | Status: DC | PRN
  Administered 2021-06-13: 17 g via ORAL
  Filled 2021-06-08: qty 1

## 2021-06-08 MED ORDER — LORAZEPAM 1 MG PO TABS
1.0000 mg | ORAL_TABLET | Freq: Two times a day (BID) | ORAL | Status: DC | PRN
  Administered 2021-06-14 – 2021-06-29 (×9): 1 mg via ORAL
  Filled 2021-06-08 (×10): qty 1

## 2021-06-08 MED ORDER — TOPIRAMATE 25 MG PO TABS
150.0000 mg | ORAL_TABLET | Freq: Every day | ORAL | Status: DC
  Administered 2021-06-08 – 2021-06-09 (×2): 150 mg via ORAL
  Filled 2021-06-08 (×2): qty 6

## 2021-06-08 MED ORDER — METOPROLOL SUCCINATE ER 50 MG PO TB24
100.0000 mg | ORAL_TABLET | Freq: Every day | ORAL | Status: DC
  Administered 2021-06-09 – 2021-07-01 (×23): 100 mg via ORAL
  Filled 2021-06-08 (×24): qty 2

## 2021-06-08 MED ORDER — ENSURE ENLIVE PO LIQD
237.0000 mL | Freq: Two times a day (BID) | ORAL | Status: DC
  Administered 2021-06-09 – 2021-07-01 (×24): 237 mL via ORAL
  Filled 2021-06-08: qty 237

## 2021-06-08 MED ORDER — PANTOPRAZOLE SODIUM 40 MG PO TBEC: 40.0000 mg | DELAYED_RELEASE_TABLET | Freq: Every day | ORAL | Status: AC

## 2021-06-08 MED ORDER — LIDOCAINE-EPINEPHRINE 1 %-1:100000 IJ SOLN
INTRAMUSCULAR | Status: AC
  Filled 2021-06-08: qty 1

## 2021-06-08 MED ORDER — ONDANSETRON HCL 4 MG PO TABS: 4.0000 mg | ORAL_TABLET | Freq: Four times a day (QID) | ORAL | Status: DC | PRN

## 2021-06-08 MED ORDER — VENLAFAXINE HCL ER 75 MG PO CP24
75.0000 mg | ORAL_CAPSULE | Freq: Every day | ORAL | Status: DC
  Administered 2021-06-09 – 2021-07-01 (×23): 75 mg via ORAL
  Filled 2021-06-08 (×23): qty 1

## 2021-06-08 MED ORDER — ENOXAPARIN SODIUM 40 MG/0.4ML IJ SOSY
40.0000 mg | PREFILLED_SYRINGE | INTRAMUSCULAR | Status: DC
  Administered 2021-06-09 – 2021-07-01 (×23): 40 mg via SUBCUTANEOUS
  Filled 2021-06-08 (×23): qty 0.4

## 2021-06-08 MED ORDER — AMLODIPINE BESYLATE 5 MG PO TABS
5.0000 mg | ORAL_TABLET | Freq: Every morning | ORAL | Status: DC
  Administered 2021-06-09 – 2021-07-01 (×23): 5 mg via ORAL
  Filled 2021-06-08 (×22): qty 1

## 2021-06-08 MED ORDER — PROCHLORPERAZINE EDISYLATE 10 MG/2ML IJ SOLN: 5.0000 mg | Freq: Four times a day (QID) | INTRAMUSCULAR | Status: DC | PRN

## 2021-06-08 MED ORDER — CLOPIDOGREL BISULFATE 75 MG PO TABS
75.0000 mg | ORAL_TABLET | Freq: Every day | ORAL | Status: DC
  Administered 2021-06-09 – 2021-07-01 (×23): 75 mg via ORAL
  Filled 2021-06-08 (×23): qty 1

## 2021-06-08 MED ORDER — CALCIUM CARBONATE 1250 (500 CA) MG PO TABS
1.0000 | ORAL_TABLET | Freq: Every day | ORAL | Status: DC
  Administered 2021-06-09 – 2021-07-01 (×23): 500 mg via ORAL
  Filled 2021-06-08 (×23): qty 1

## 2021-06-08 MED ORDER — PROCHLORPERAZINE 25 MG RE SUPP: 12.5000 mg | Freq: Four times a day (QID) | RECTAL | Status: DC | PRN

## 2021-06-08 MED ORDER — ZOLPIDEM TARTRATE 5 MG PO TABS
5.0000 mg | ORAL_TABLET | Freq: Every evening | ORAL | Status: DC | PRN
  Administered 2021-06-08 – 2021-06-10 (×2): 5 mg via ORAL
  Filled 2021-06-08 (×2): qty 1

## 2021-06-08 MED ORDER — ADULT MULTIVITAMIN W/MINERALS CH
1.0000 | ORAL_TABLET | Freq: Every day | ORAL | Status: DC
  Administered 2021-06-09 – 2021-07-01 (×23): 1 via ORAL
  Filled 2021-06-08 (×23): qty 1

## 2021-06-08 MED ORDER — BISACODYL 10 MG RE SUPP
10.0000 mg | Freq: Every day | RECTAL | Status: DC | PRN
  Administered 2021-06-11: 10 mg via RECTAL
  Filled 2021-06-08: qty 1

## 2021-06-08 MED ORDER — PANTOPRAZOLE SODIUM 40 MG PO TBEC
40.0000 mg | DELAYED_RELEASE_TABLET | Freq: Every day | ORAL | Status: DC
  Administered 2021-06-09 – 2021-07-01 (×23): 40 mg via ORAL
  Filled 2021-06-08 (×23): qty 1

## 2021-06-08 MED ORDER — VITAMIN D 25 MCG (1000 UNIT) PO TABS
1000.0000 [IU] | ORAL_TABLET | Freq: Every day | ORAL | Status: DC
  Administered 2021-06-09 – 2021-07-01 (×23): 1000 [IU] via ORAL
  Filled 2021-06-08 (×23): qty 1

## 2021-06-08 MED ORDER — CLOPIDOGREL BISULFATE 75 MG PO TABS: 75.0000 mg | ORAL_TABLET | Freq: Every day | ORAL | Status: DC

## 2021-06-08 SURGICAL SUPPLY — 2 items
MONITOR MOBILE MNGR LINQ22 (Prosthesis & Implant Heart) ×2 IMPLANT
PACK LOOP INSERTION (CUSTOM PROCEDURE TRAY) ×2 IMPLANT

## 2021-06-08 NOTE — H&P (Signed)
Physical Medicine and Rehabilitation Admission H&P    Chief Complaint  Patient presents with   Functional deficits due to stroke    HPI: Michaela Rios is a 70 year old female with history of HTN, anxiety/depression, CKD III, migranes, chronic LBP, frequent UTIs who was admitted on 05/24/21 after a fall with reports of weakness, urinary incontinence and poor po intake. She was febrile in ED with T 101, was hypokalemic- K-2.8 and was tachycardic. She was treated with IVF and started on gentamicin for SIRS. She reported right flank pain and infectious work up negative. CT head showed multifocal hypoattenuations and LP recommended by neurology.   EEG showed intermittent generalized slowing in left temporal region felt to be nonspecific etiology.  Patient with elevated d-dimer-->CTA chest negative for PE and incidental right nodule ->recs to repeat CT in a year and BLE dopplers negative for DVT.  MRI brain showed restricted diffusion in right basal ganglia corresponding to lenticulostriate territory and hyperintensity in left head of caudate, left thalamus and right medial thalamus with vasogenic type edema felt to be unusual pattern --question of subacute ischemia, tumor, infection/cerebritis or toxoplasmosis.    She was treated with 5 day course of antibiotics and developed fever once antibiotics d/c. ID consulted for input and recommended monitoring off antibiotics. Repeat blood cultures negative.  LP done showing elevated protein  with 3 WBC, culture negative--CSF oligoclonal bands/paraneoplastic panel pending. .  Vasculitis work up showed patient to be ANA positive/DSDNA negative and she was treated with 3 day course of IV steroids due to concerns of vasculitis.   Dr. Leonel Ramsay questioned lymphoma as cause of stroke as well as T2 changes and recommended EBV serology--showed evidence of prior infection as well as TEE and MR spectroscopy. She underwent radial cerebral angiogram by Dr. Deatra Ina on 07/05 which was negative for vasculitis, AVM, stenosis, dural AV fistua or other significant vascular abnormality. Post procedure developed large dorsal hand hematoma treated with TR band and dopplers were negative for pseudoaneurysm. Dr. Oneida Alar consulted and felt that no evidence of compartment syndrome noted.   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 TEE done 07/07 and was negative for endocarditis but showed large patent PFO. Dr. Leonie Man felt that repeat MRI with "punctate infarcts likely periprocedural complication of cerebral angiogram and clinically silent" and he doubted that PFO was cause of her stroke. Doubt lymphoma as CSF negative for abnormal cells and hyperintensities improving--Loop recorder recommended due to rule out occult A fib. Neurology recommends DAPT X 3 weeks followed by ASA alone and follow up MRI brain in a month on outpatient basis.  Her mentation has been improving but she continues  to have deficits in balance w/ posterior/right lateral lean, delayed processing affecting ability to carry out ADLs as well as weakness. CIR recommended due to functional decline.   Of note, has a large PFO, however due to ROPE score, 0% chance causing the stroke.   Denies pain currently- but has had 4 migraines since here- used ot take Imitrex- was told couldn't take anymore- also took Norco at home if Imitrex didn't work.  LBM 6 days ago, but eating <25% of meals and sometimes nothing for meals- no appetite.  Did have diarrhea last week x 2 days due to ABX.  Also having bladder incontinence ever since stroke- cannot control it.  Review of Systems  Constitutional:  Negative for chills and fever.  HENT:  Negative for hearing loss and tinnitus.   Eyes:  Negative for blurred vision, double vision and discharge.  Respiratory:   Positive for shortness of breath (with activity./). Negative for cough.   Cardiovascular:  Negative for chest pain.  Gastrointestinal:  Negative for abdominal pain, constipation, heartburn and vomiting.  Genitourinary:  Negative for dysuria and urgency.       Frequent UTIs. --mild incontinence worse since admission  Musculoskeletal:  Positive for falls (legs give out--falls once a month). Negative for back pain, myalgias and neck pain.  Skin:  Negative for itching and rash.  Neurological:  Positive for dizziness, sensory change (right hand 2nd/3rd finger since shingles episode) and headaches.  Psychiatric/Behavioral:  Positive for depression. The patient has insomnia.   All other systems reviewed and are negative.   Past Medical History:  Diagnosis Date   Anemia    Anxiety    Arthritis    Cataract    Chronic migraine    follows with neuro for same   Clotting disorder (HCC)    clot in ovarian vein- hx   Esophagitis    GERD (gastroesophageal reflux disease)    Hypertension    IBS (irritable bowel syndrome)    Low back pain syndrome    MRSA (methicillin resistant Staphylococcus aureus) infection 04/17/12   left torso   Peptic ulcer disease 2003, 12/2012   Peripheral vascular disease ([place])    Vitamin D deficiency     Past Surgical History:  Procedure Laterality Date   ABDOMINAL HYSTERECTOMY     BUBBLE STUDY  06/03/2021   Procedure: BUBBLE STUDY;  Surgeon: Donato Heinz, MD;  Location: [facility] ENDOSCOPY;  Service: Cardiovascular;;   IR ANGIO EXTERNAL CAROTID SEL EXT CAROTID UNI R MOD SED  06/01/2021   IR ANGIO INTRA EXTRACRAN SEL COM CAROTID INNOMINATE UNI L MOD SED  06/01/2021   IR ANGIO INTRA EXTRACRAN SEL INTERNAL CAROTID UNI R MOD SED  06/01/2021   IR ANGIO VERTEBRAL SEL VERTEBRAL UNI R MOD SED  06/01/2021   IR US GUIDE VASC ACCESS RIGHT  06/01/2021   LAPAROSCOPIC BILATERAL SALPINGO OOPHERECTOMY     pud surgery w/duod sticture-plasty and vagotomy  2003   Dr. Marlou Starks   TEE WITHOUT  CARDIOVERSION N/A 06/03/2021   Procedure: TRANSESOPHAGEAL ECHOCARDIOGRAM (TEE);  Surgeon: Donato Heinz, MD;  Location: [facility] ENDOSCOPY;  Service: Cardiovascular;  Laterality: N/A;   TONSILLECTOMY AND ADENOIDECTOMY     as a child   TUBAL LIGATION  1984    Family History  Problem Relation Age of Onset   Aneurysm Father    Prostate cancer Brother    Lung cancer Brother    Colon cancer Neg Hx    Esophageal cancer Neg Hx    Rectal cancer Neg Hx    Stomach cancer Neg Hx     Social History:  Married--sedentary due to SOB? Husband manages home, cooks since last year. She used to work at [employer] center--retired 2013 to help take care of sick brother. She  reports that she has never smoked. She has never used smokeless tobacco. She reports current alcohol use--rare months agao. She reports that she does not use drugs.   Allergies  Allergen Reactions   Amitriptyline Hcl     Changed personality   Shellfish Allergy Nausea And Vomiting and Other (See Comments)    Tested "high" on allergy test   Amoxicillin Hives   Cephalexin  Hives   Lisinopril Cough    .   Penicillins Hives    Medications Prior to Admission  Medication Sig Dispense Refill   acetaminophen (TYLENOL) 650 MG CR tablet Take 650-1,300 mg by mouth every 8 (eight) hours as needed for pain.     amLODipine (NORVASC) 5 MG tablet TAKE ONE TABLET BY MOUTH EVERY MORNING (Patient taking differently: Take 5 mg by mouth in the morning.) 90 tablet 1   buPROPion (WELLBUTRIN XL) 300 MG 24 hr tablet TAKE ONE TABLET BY MOUTH EVERY MORNING (Patient taking differently: Take 300 mg by mouth in the morning.) 90 tablet 2   Cholecalciferol (VITAMIN D3 PO) Take 1 tablet by mouth daily.     EPINEPHrine (EPIPEN 2-PAK) 0.3 mg/0.3 mL IJ SOAJ injection Inject 0.3 mLs (0.3 mg total) into the muscle as needed for anaphylaxis. 1 each 0   furosemide (LASIX) 20 MG tablet TAKE ONE TABLET BY MOUTH EVERY MORNING (Patient taking differently: Take 20 mg  by mouth in the morning.) 90 tablet 2   hydrALAZINE (APRESOLINE) 25 MG tablet TAKE ONE TABLET BY MOUTH EVERY MORNING and TAKE ONE TABLET BY MOUTH EVERYDAY AT BEDTIME (Patient taking differently: Take 25 mg by mouth in the morning and at bedtime.) 90 tablet 6   LORazepam (ATIVAN) 1 MG tablet Take 1 tablet (1 mg total) by mouth 2 (two) times daily as needed. 180 tablet 0   metoprolol succinate (TOPROL-XL) 100 MG 24 hr tablet TAKE ONE TABLET BY MOUTH EVERY MORNING WITH OR immediately following A meal (Patient taking differently: Take 100 mg by mouth daily with breakfast.) 90 tablet 3   ondansetron (ZOFRAN) 4 MG tablet Take 1 tablet (4 mg total) by mouth every 6 (six) hours as needed for nausea. 20 tablet 0   SUMAtriptan (IMITREX) 100 MG tablet Take one tablet (100 mg) by mouth at onset of migraine headache, may repeat in 2 hours if still needed (Patient taking differently: Take 100 mg by mouth See admin instructions. Take one tablet (100 mg) by mouth at onset of migraine headache, may repeat in 2 hours if still needed) 27 tablet 3   tiZANidine (ZANAFLEX) 4 MG tablet TAKE ONE TABLET BY MOUTH twice daily AS NEEDED FOR muscle SPASMS (Patient taking differently: Take 4 mg by mouth 2 (two) times daily as needed for muscle spasms.) 30 tablet 1   topiramate (TOPAMAX) 100 MG tablet Take 1.5 tablets (150 mg total) by mouth at bedtime. 135 tablet 1   venlafaxine XR (EFFEXOR-XR) 75 MG 24 hr capsule TAKE THREE CAPSULES BY MOUTH EVERY MORNING (Patient taking differently: Take 75 mg by mouth daily with breakfast.) 270 capsule 6   zolpidem (AMBIEN) 10 MG tablet TAKE ONE TABLET BY MOUTH EVERYDAY AT BEDTIME AS NEEDED FOR SLEEP (Patient taking differently: Take 10 mg by mouth at bedtime.) 30 tablet 1   nitrofurantoin, macrocrystal-monohydrate, (MACROBID) 100 MG capsule Take 1 capsule (100 mg total) by mouth 2 (two) times daily. (Patient not taking: No sig reported) 14 capsule 0   omeprazole (PRILOSEC) 20 MG capsule Take 1  capsule (20 mg total) by mouth daily. (Patient not taking: No sig reported) 90 capsule 3    Drug Regimen Review  Drug regimen was reviewed and remains appropriate with no significant issues identified  Home: Home Living Family/patient expects to be discharged to:: Private residence Living Arrangements: Spouse/significant other Available Help at Discharge: Family, Available 24 hours/day Type of Home: House Home Access: Stairs to enter [employer] of Steps: 2-5 Entrance  Stairs-Rails: Right Home Layout: One level Bathroom Shower/Tub: Multimedia programmer: Handicapped height Bathroom Accessibility: Yes Home Equipment: Civil engineer, contracting, Grab bars - tub/shower Additional Comments: Husband retired and available for 24/7 assist  Lives With: Spouse   Functional History: Prior Function Level of Independence: Independent Comments: Independent without DME. 1x fall that led to admission, otherwise no falls. retired from work. enjoys reading  Functional Status:  Mobility: Bed Mobility Overal bed mobility: Needs Assistance Bed Mobility: Supine to Sit Rolling: Min assist Sidelying to sit: Mod assist Supine to sit: Mod assist, HOB elevated Sit to supine: Mod assist General bed mobility comments: up in recliner Transfers Overall transfer level: Needs assistance Equipment used: Rolling walker (2 wheeled) Transfers: Sit to/from Stand, Stand Pivot Transfers Sit to Stand: Mod assist Stand pivot transfers: Mod assist General transfer comment: Cues for sequencing and hand placement. Ambulation/Gait Ambulation/Gait assistance: Mod assist Gait Distance (Feet): 30 Feet Assistive device: Rolling walker (2 wheeled) Gait Pattern/deviations: Decreased step length - left, Trunk flexed, Step-through pattern, Staggering right General Gait Details: assist to maintain balance and manage RW. Continuous verbal cues for sequencing. Increased processing time when cueing for  sequencing. Gait velocity: decreased Gait velocity interpretation: <1.8 ft/sec, indicate of risk for recurrent falls    ADL: ADL Overall ADL's : Needs assistance/impaired Eating/Feeding: Set up, Sitting Grooming: Minimal assistance, Standing, Set up, Sitting Grooming Details (indicate cue type and reason): Standing at sink for grooming tasks x2 mins- able to wash face and sitting in recliner for brushing teeth due to posterior/R lateral lean in standing. Pt requiring increased time for task and prompts to add more toothpaste. Upper Body Bathing: Minimal assistance, Sitting Upper Body Bathing Details (indicate cue type and reason): reminders to sit up straight/maintain midline due to posterior and L sided leaning. Lower Body Bathing: Minimal assistance, Sitting/lateral leans, Sit to/from stand Lower Body Bathing Details (indicate cue type and reason): completed at sink Upper Body Dressing : Min guard, Sitting Lower Body Dressing: Moderate assistance, Sit to/from stand, Sitting/lateral leans Lower Body Dressing Details (indicate cue type and reason): Needed assist bringing briefs over her feet and steadying her while she pulled them up in standing. Toilet Transfer: Moderate assistance, Ambulation, Stand-pivot, Grab bars Toilet Transfer Details (indicate cue type and reason): Pt with R lateral lean and posterior lean with cues for proper hand placement. Toileting- Water quality scientist and Hygiene: Min guard, Sitting/lateral lean, Sit to/from stand Scientist, research (medical): Moderate assistance, +2 for physical assistance, +2 for safety/equipment, Stand-pivot Functional mobility during ADLs: Moderate assistance, Rolling walker, Cueing for safety, Cueing for sequencing General ADL Comments: Pt's session focused on ADL at sink to challenge balance and cognition. Pt with increased ability to care for self since eval, but modA overall for stability and mobility with RW.  Cognition: Cognition Overall  Cognitive Status: Impaired/Different from baseline Arousal/Alertness: Awake/alert Orientation Level: Oriented X4 Attention: Focused, Sustained Focused Attention: Impaired Sustained Attention: Impaired Memory: Impaired Memory Impairment: Decreased recall of new information, Decreased short term memory Awareness: Impaired Problem Solving: Impaired Executive Function: Organizing, Self Monitoring Organizing: Impaired Self Monitoring: Impaired Safety/Judgment: Impaired Cognition Arousal/Alertness: Awake/alert Behavior During Therapy: Flat affect Overall Cognitive Status: Impaired/Different from baseline Area of Impairment: Attention, Following commands, Safety/judgement, Awareness, Problem solving, Memory Current Attention Level: Sustained Memory: Decreased short-term memory Following Commands: Follows one step commands consistently, Follows multi-step commands with increased time, Follows multi-step commands inconsistently Safety/Judgement: Decreased awareness of safety, Decreased awareness of deficits Awareness: Emergent Problem Solving: Slow processing, Requires verbal cues, Requires  tactile cues, Difficulty sequencing, Decreased initiation General Comments: Short Blessed Test: Incorrect time within 1 hour 15 mins and cues to state months of year in reverse order. Pt scoring 3/28 for cues with memory and sequencing. Pt stating "how would I know what time it is without looking at clock?" OTR explained pt was aware that is was the afternoon and required no cues for selecting a time. Pt statin "3:45pm and it was 2:27pm."  Physical Exam: Blood pressure (!) 140/93, pulse 89, temperature 98.2 F (36.8 C), temperature source Oral, resp. rate 18, height 5\' 3"  (1.6 m), weight 60.8 kg, SpO2 94 %. Physical Exam Vitals and nursing note reviewed.  Constitutional:      Appearance: Normal appearance. She is normal weight. She is not ill-appearing.     Comments: Sitting up in bedside chair- husband  in room - snacks in room, but bags of snacks full; NAD   HENT:     Head: Normocephalic and atraumatic.     Comments: Smile equal; tongue midline; facial sensation intact    Right Ear: External ear normal.     Left Ear: External ear normal.     Nose: Nose normal. No congestion.     Mouth/Throat:     Mouth: Mucous membranes are dry.     Pharynx: Oropharynx is clear. No oropharyngeal exudate.  Eyes:     General:        Right eye: No discharge.        Left eye: No discharge.     Extraocular Movements: Extraocular movements intact.     Comments: A little nystagmus to left  Cardiovascular:     Rate and Rhythm: Regular rhythm. Tachycardia present.     Heart sounds: Normal heart sounds. No murmur heard.   No gallop.  Pulmonary:     Comments: CTA B/L- no W/R/R- good air movement   Abdominal:     Comments: Soft, NT, ND, (+)BS - hypoactive  Genitourinary:    Comments: Purewick in place- medium amber urine in container Musculoskeletal:     Cervical back: Normal range of motion and neck supple.     Comments: Right hand with min edema and resolving ecchymosis--small area of clearing at puncture site.  +radial pulse--both hands are cool to touch equally.   RUE- biceps, triceps 4+/5, WE 3+/5, grip 3/5, FA 3-/5 LUE- 4+/5 except FA 4-/5 RLE- HF 4/5, KE 4/5, KF 4+/5, DF/PF 4+/5 LLE- HF 4-/5, KE/KF 4+/5, DF/PF 4+/5   Skin:    General: Skin is warm and dry.     Comments: Bruising - purple around R wrist/hand  Neurological:     Mental Status: She is alert and oriented to person, place, and time.     Comments: Slow to initiate --able to answer basic orientation questions but thought it was August and Wed-->needs cues to correct. She is able to follow simple one step motor commands.  Told me was in [place]; July- 2022- knew last holiday was 4th July.  Slightly delayed responses; decreased memory  Psychiatric:     Comments: Slightly flat    Results for orders placed or performed  during the hospital encounter of 05/24/21 (from the past 48 hour(s))  CBC     Status: Abnormal   Collection Time: 06/07/21  1:40 AM  Result Value Ref Range   WBC 10.4 4.0 - 10.5 K/uL   RBC 3.37 (L) 3.87 - 5.11 MIL/uL   Hemoglobin 9.9 (L) 12.0 - 15.0 g/dL  HCT 30.5 (L) 36.0 - 46.0 %   MCV 90.5 80.0 - 100.0 fL   MCH 29.4 26.0 - 34.0 pg   MCHC 32.5 30.0 - 36.0 g/dL   RDW 15.7 (H) 11.5 - 15.5 %   Platelets 405 (H) 150 - 400 K/uL   nRBC 0.3 (H) 0.0 - 0.2 %    Comment: Performed at [facility] [address] [place], [place] [address]  Basic metabolic panel     Status: Abnormal   Collection Time: 06/07/21  1:40 AM  Result Value Ref Range   Sodium 133 (L) 135 - 145 mmol/L   Potassium 3.8 3.5 - 5.1 mmol/L   Chloride 105 98 - 111 mmol/L   CO2 20 (L) 22 - 32 mmol/L   Glucose, Bld 90 70 - 99 mg/dL    Comment: Glucose reference range applies only to samples taken after fasting for at least 8 hours.   BUN 16 8 - 23 mg/dL   Creatinine, Ser 0.76 0.44 - 1.00 mg/dL   Calcium 8.7 (L) 8.9 - 10.3 mg/dL   GFR, Estimated >60 >60 mL/min    Comment: (NOTE) Calculated using the CKD-EPI Creatinine Equation (2021)    Anion gap 8 5 - 15    Comment: Performed at [facility] [address], [place], [place] [address]  Magnesium     Status: None   Collection Time: 06/07/21  1:40 AM  Result Value Ref Range   Magnesium 2.1 1.7 - 2.4 mg/dL    Comment: Performed at [facility] [address], [place], [place] [address]   No results found.     Medical Problem List and Plan: 1.  B/L weakness secondary to R BG and B/L thalamic infarcts  -patient may  shower  -ELOS/Goals: 21-24 days- supervision to min A 2.  Antithrombotics: -DVT/anticoagulation:  Pharmaceutical: Lovenox  -antiplatelet therapy: DAPT X 3 weeks followed by ASA alone.  3. Pain Management: Tylenol prn. Has migraines- cannot take Imitrex- suggest Nurtec prn for migraines 4. Mood: LCSW to follow for evaluation and  support.   -antipsychotic agents: N/A 5. Neuropsych: This patient is not  capable of making decisions on his own behalf. 6. Skin/Wound Care: Routine pressure relief measures.  7. Fluids/Electrolytes/Nutrition: Monitor I/O. Check lytes in am.  8. HTN: Monitor BP tid-- 9. CKD III: Has been incontinent since admission. Will monitor voiding with PVR checkss.  --set toileting schedule.  10. H/o depression/anxiety: Managed with Effexor, Wellbutrin and lorazepam prn.  11. H/o Migraines: controlled on Topamax.  Has had 4x since admission- strongly suggest Nurtec prn; continue Topamax.  12. RML nodule: Recommend repeat CT in 12 months.  13. Hyponatremia: Continue to monitor for now 14. Acute on chronic anemia of chronic disease:  Hgb 11.6 at admission-->9.9 15. FUO: WBC trending up but likely due to steroids.   --Continue to monitor for fevers or other signs of infection.  16. Poor appetite- will monitor- might need appetite stimulant 17. Constipation vs poor appetite- LBM 6 days ago- suggest KUB.    Bary Leriche, PA-C 06/08/2021   I have personally performed a face to face diagnostic evaluation of this patient and formulated the key components of the plan.  Additionally, I have personally reviewed laboratory data, imaging studies, as well as relevant notes and concur with the physician assistant's documentation above.   The patient's status has not changed from the original H&P.  Any changes in documentation from the acute care chart  have been noted above.

## 2021-06-08 NOTE — Care Management Important Message (Signed)
Important Message  Patient Details  Name: Michaela Rios MRN: [id number] Date of Birth: 04-26-1951   Medicare Important Message Given:  Yes     [name] 06/08/2021, 9:57 AM

## 2021-06-08 NOTE — H&P (Signed)
Physical Medicine and Rehabilitation Admission H&P        Chief Complaint  Patient presents with   Functional deficits due to stroke      HPI: Michaela Rios is a 70 year old female with history of HTN, anxiety/depression, CKD III, migranes, chronic LBP, frequent UTIs who was admitted on 05/24/21 after a fall with reports of weakness, urinary incontinence and poor po intake. She was febrile in ED with T 101, was hypokalemic- K-2.8 and was tachycardic. She was treated with IVF and started on gentamicin for SIRS. She reported right flank pain and infectious work up negative. CT head showed multifocal hypoattenuations and LP recommended by neurology.   EEG showed intermittent generalized slowing in left temporal region felt to be nonspecific etiology.  Patient with elevated d-dimer-->CTA chest negative for PE and incidental right nodule ->recs to repeat CT in a year and BLE dopplers negative for DVT.  MRI brain showed restricted diffusion in right basal ganglia corresponding to lenticulostriate territory and hyperintensity in left head of caudate, left thalamus and right medial thalamus with vasogenic type edema felt to be unusual pattern --question of subacute ischemia, tumor, infection/cerebritis or toxoplasmosis.     She was treated with 5 day course of antibiotics and developed fever once antibiotics d/c. ID consulted for input and recommended monitoring off antibiotics. Repeat blood cultures negative.  LP done showing elevated protein  with 3 WBC, culture negative--CSF oligoclonal bands/paraneoplastic panel pending. . Vasculitis work up showed patient to be ANA positive/DSDNA negative and she was treated with 3 day course of IV steroids due to concerns of vasculitis.   Dr. Leonel Ramsay questioned lymphoma as cause of stroke as well as T2 changes and recommended EBV serology--showed evidence of prior infection as well as TEE and MR spectroscopy. She underwent radial cerebral angiogram by Dr.  Deatra Ina on 07/05 which was negative for vasculitis, AVM, stenosis, dural AV fistua or other significant vascular abnormality. Post procedure developed large dorsal hand hematoma treated with TR band and dopplers were negative for pseudoaneurysm. Dr. Oneida Alar consulted and felt that no evidence of compartment syndrome noted.   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 TEE done 07/07 and was negative for endocarditis but showed large patent PFO. Dr. Leonie Man felt that repeat MRI with "punctate infarcts likely periprocedural complication of cerebral angiogram and clinically silent" and he doubted that PFO was cause of her stroke. Doubt lymphoma as CSF negative for abnormal cells and hyperintensities improving--Loop recorder recommended due to rule out occult A fib. Neurology recommends DAPT X 3 weeks followed by ASA alone and follow up MRI brain in a month on outpatient basis.  Her mentation has been improving but she continues  to have deficits in balance w/ posterior/right lateral lean, delayed processing affecting ability to carry out ADLs as well as weakness. CIR recommended due to functional decline.   Of note, has a large PFO, however due to ROPE score, 0% chance causing the stroke.   Denies pain currently- but has had 4 migraines since here- used ot take Imitrex- was told couldn't take anymore- also took Norco at home if Imitrex didn't work. LBM 6 days ago, but eating <25% of meals and sometimes nothing for meals- no appetite. Did have diarrhea last week x 2 days due to ABX. Also having bladder incontinence ever since stroke- cannot  control it.      Review of Systems Constitutional:  Negative for chills and fever. HENT:  Negative for hearing loss and tinnitus.   Eyes:  Negative for blurred vision, double vision and discharge. Respiratory:   Positive for shortness of breath (with activity./). Negative for cough.   Cardiovascular:  Negative for chest pain. Gastrointestinal:  Negative for abdominal pain, constipation, heartburn and vomiting. Genitourinary:  Negative for dysuria and urgency.       Frequent UTIs. --mild incontinence worse since admission  Musculoskeletal:  Positive for falls (legs give out--falls once a month). Negative for back pain, myalgias and neck pain. Skin:  Negative for itching and rash. Neurological:  Positive for dizziness, sensory change (right hand 2nd/3rd finger since shingles episode) and headaches. Psychiatric/Behavioral:  Positive for depression. The patient has insomnia.   All other systems reviewed and are negative.         Past Medical History:  Diagnosis Date   Anemia     Anxiety     Arthritis     Cataract     Chronic migraine      follows with neuro for same   Clotting disorder (HCC)      clot in ovarian vein- hx   Esophagitis     GERD (gastroesophageal reflux disease)     Hypertension     IBS (irritable bowel syndrome)     Low back pain syndrome     MRSA (methicillin resistant Staphylococcus aureus) infection 04/17/12    left torso   Peptic ulcer disease 2003, 12/2012   Peripheral vascular disease ([place])     Vitamin D deficiency             Past Surgical History:  Procedure Laterality Date   ABDOMINAL HYSTERECTOMY       BUBBLE STUDY   06/03/2021    Procedure: BUBBLE STUDY;  Surgeon: Donato Heinz, MD;  Location: [facility] ENDOSCOPY;  Service: Cardiovascular;;   IR ANGIO EXTERNAL CAROTID SEL EXT CAROTID UNI R MOD SED   06/01/2021   IR ANGIO INTRA EXTRACRAN SEL COM CAROTID INNOMINATE UNI L MOD SED   06/01/2021   IR ANGIO INTRA EXTRACRAN SEL INTERNAL CAROTID UNI R MOD SED   06/01/2021   IR ANGIO VERTEBRAL SEL VERTEBRAL UNI R MOD SED   06/01/2021   IR US GUIDE VASC ACCESS RIGHT   06/01/2021   LAPAROSCOPIC BILATERAL SALPINGO OOPHERECTOMY       pud surgery w/duod sticture-plasty and  vagotomy   2003    Dr. Marlou Starks   TEE WITHOUT CARDIOVERSION N/A 06/03/2021    Procedure: TRANSESOPHAGEAL ECHOCARDIOGRAM (TEE);  Surgeon: Donato Heinz, MD;  Location: [facility] ENDOSCOPY;  Service: Cardiovascular;  Laterality: N/A;   TONSILLECTOMY AND ADENOIDECTOMY        as a child   TUBAL LIGATION   1984           Family History  Problem Relation Age of Onset   Aneurysm Father     Prostate cancer Brother     Lung cancer Brother     Colon cancer Neg Hx     Esophageal cancer Neg Hx     Rectal cancer Neg Hx     Stomach cancer Neg Hx        Social History:  Married--sedentary due to SOB? Husband manages home, cooks since last year. She used to work at [employer] center--retired 2013 to help take care of sick brother. She  reports that she has never smoked. She  has never used smokeless tobacco. She reports current alcohol use--rare months agao. She reports that she does not use drugs.          Allergies  Allergen Reactions   Amitriptyline Hcl        Changed personality   Shellfish Allergy Nausea And Vomiting and Other (See Comments)      Tested "high" on allergy test   Amoxicillin Hives   Cephalexin Hives   Lisinopril Cough      .   Penicillins Hives            Medications Prior to Admission  Medication Sig Dispense Refill   acetaminophen (TYLENOL) 650 MG CR tablet Take 650-1,300 mg by mouth every 8 (eight) hours as needed for pain.       amLODipine (NORVASC) 5 MG tablet TAKE ONE TABLET BY MOUTH EVERY MORNING (Patient taking differently: Take 5 mg by mouth in the morning.) 90 tablet 1   buPROPion (WELLBUTRIN XL) 300 MG 24 hr tablet TAKE ONE TABLET BY MOUTH EVERY MORNING (Patient taking differently: Take 300 mg by mouth in the morning.) 90 tablet 2   Cholecalciferol (VITAMIN D3 PO) Take 1 tablet by mouth daily.       EPINEPHrine (EPIPEN 2-PAK) 0.3 mg/0.3 mL IJ SOAJ injection Inject 0.3 mLs (0.3 mg total) into the muscle as needed for anaphylaxis. 1 each 0   furosemide (LASIX)  20 MG tablet TAKE ONE TABLET BY MOUTH EVERY MORNING (Patient taking differently: Take 20 mg by mouth in the morning.) 90 tablet 2   hydrALAZINE (APRESOLINE) 25 MG tablet TAKE ONE TABLET BY MOUTH EVERY MORNING and TAKE ONE TABLET BY MOUTH EVERYDAY AT BEDTIME (Patient taking differently: Take 25 mg by mouth in the morning and at bedtime.) 90 tablet 6   LORazepam (ATIVAN) 1 MG tablet Take 1 tablet (1 mg total) by mouth 2 (two) times daily as needed. 180 tablet 0   metoprolol succinate (TOPROL-XL) 100 MG 24 hr tablet TAKE ONE TABLET BY MOUTH EVERY MORNING WITH OR immediately following A meal (Patient taking differently: Take 100 mg by mouth daily with breakfast.) 90 tablet 3   ondansetron (ZOFRAN) 4 MG tablet Take 1 tablet (4 mg total) by mouth every 6 (six) hours as needed for nausea. 20 tablet 0   SUMAtriptan (IMITREX) 100 MG tablet Take one tablet (100 mg) by mouth at onset of migraine headache, may repeat in 2 hours if still needed (Patient taking differently: Take 100 mg by mouth See admin instructions. Take one tablet (100 mg) by mouth at onset of migraine headache, may repeat in 2 hours if still needed) 27 tablet 3   tiZANidine (ZANAFLEX) 4 MG tablet TAKE ONE TABLET BY MOUTH twice daily AS NEEDED FOR muscle SPASMS (Patient taking differently: Take 4 mg by mouth 2 (two) times daily as needed for muscle spasms.) 30 tablet 1   topiramate (TOPAMAX) 100 MG tablet Take 1.5 tablets (150 mg total) by mouth at bedtime. 135 tablet 1   venlafaxine XR (EFFEXOR-XR) 75 MG 24 hr capsule TAKE THREE CAPSULES BY MOUTH EVERY MORNING (Patient taking differently: Take 75 mg by mouth daily with breakfast.) 270 capsule 6   zolpidem (AMBIEN) 10 MG tablet TAKE ONE TABLET BY MOUTH EVERYDAY AT BEDTIME AS NEEDED FOR SLEEP (Patient taking differently: Take 10 mg by mouth at bedtime.) 30 tablet 1   nitrofurantoin, macrocrystal-monohydrate, (MACROBID) 100 MG capsule Take 1 capsule (100 mg total) by mouth 2 (two) times daily. (Patient  not taking: No  sig reported) 14 capsule 0   omeprazole (PRILOSEC) 20 MG capsule Take 1 capsule (20 mg total) by mouth daily. (Patient not taking: No sig reported) 90 capsule 3      Drug Regimen Review  Drug regimen was reviewed and remains appropriate with no significant issues identified   Home: Home Living Family/patient expects to be discharged to:: Private residence Living Arrangements: Spouse/significant other Available Help at Discharge: Family, Available 24 hours/day Type of Home: House Home Access: Stairs to enter [employer] of Steps: 2-5 Entrance Stairs-Rails: Right Home Layout: One level Bathroom Shower/Tub: Multimedia programmer: Handicapped height Bathroom Accessibility: Yes Home Equipment: Civil engineer, contracting, Grab bars - tub/shower Additional Comments: Husband retired and available for 24/7 assist  Lives With: Spouse   Functional History: Prior Function Level of Independence: Independent Comments: Independent without DME. 1x fall that led to admission, otherwise no falls. retired from work. enjoys reading   Functional Status:  Mobility: Bed Mobility Overal bed mobility: Needs Assistance Bed Mobility: Supine to Sit Rolling: Min assist Sidelying to sit: Mod assist Supine to sit: Mod assist, HOB elevated Sit to supine: Mod assist General bed mobility comments: up in recliner Transfers Overall transfer level: Needs assistance Equipment used: Rolling walker (2 wheeled) Transfers: Sit to/from Stand, Stand Pivot Transfers Sit to Stand: Mod assist Stand pivot transfers: Mod assist General transfer comment: Cues for sequencing and hand placement. Ambulation/Gait Ambulation/Gait assistance: Mod assist Gait Distance (Feet): 30 Feet Assistive device: Rolling walker (2 wheeled) Gait Pattern/deviations: Decreased step length - left, Trunk flexed, Step-through pattern, Staggering right General Gait Details: assist to maintain balance and manage RW.  Continuous verbal cues for sequencing. Increased processing time when cueing for sequencing. Gait velocity: decreased Gait velocity interpretation: <1.8 ft/sec, indicate of risk for recurrent falls   ADL: ADL Overall ADL's : Needs assistance/impaired Eating/Feeding: Set up, Sitting Grooming: Minimal assistance, Standing, Set up, Sitting Grooming Details (indicate cue type and reason): Standing at sink for grooming tasks x2 mins- able to wash face and sitting in recliner for brushing teeth due to posterior/R lateral lean in standing. Pt requiring increased time for task and prompts to add more toothpaste. Upper Body Bathing: Minimal assistance, Sitting Upper Body Bathing Details (indicate cue type and reason): reminders to sit up straight/maintain midline due to posterior and L sided leaning. Lower Body Bathing: Minimal assistance, Sitting/lateral leans, Sit to/from stand Lower Body Bathing Details (indicate cue type and reason): completed at sink Upper Body Dressing : Min guard, Sitting Lower Body Dressing: Moderate assistance, Sit to/from stand, Sitting/lateral leans Lower Body Dressing Details (indicate cue type and reason): Needed assist bringing briefs over her feet and steadying her while she pulled them up in standing. Toilet Transfer: Moderate assistance, Ambulation, Stand-pivot, Grab bars Toilet Transfer Details (indicate cue type and reason): Pt with R lateral lean and posterior lean with cues for proper hand placement. Toileting- Water quality scientist and Hygiene: Min guard, Sitting/lateral lean, Sit to/from stand Scientist, research (medical): Moderate assistance, +2 for physical assistance, +2 for safety/equipment, Stand-pivot Functional mobility during ADLs: Moderate assistance, Rolling walker, Cueing for safety, Cueing for sequencing General ADL Comments: Pt's session focused on ADL at sink to challenge balance and cognition. Pt with increased ability to care for self since eval, but modA  overall for stability and mobility with RW.   Cognition: Cognition Overall Cognitive Status: Impaired/Different from baseline Arousal/Alertness: Awake/alert Orientation Level: Oriented X4 Attention: Focused, Sustained Focused Attention: Impaired Sustained Attention: Impaired Memory: Impaired Memory Impairment: Decreased recall of  new information, Decreased short term memory Awareness: Impaired Problem Solving: Impaired Executive Function: Organizing, Self Monitoring Organizing: Impaired Self Monitoring: Impaired Safety/Judgment: Impaired Cognition Arousal/Alertness: Awake/alert Behavior During Therapy: Flat affect Overall Cognitive Status: Impaired/Different from baseline Area of Impairment: Attention, Following commands, Safety/judgement, Awareness, Problem solving, Memory Current Attention Level: Sustained Memory: Decreased short-term memory Following Commands: Follows one step commands consistently, Follows multi-step commands with increased time, Follows multi-step commands inconsistently Safety/Judgement: Decreased awareness of safety, Decreased awareness of deficits Awareness: Emergent Problem Solving: Slow processing, Requires verbal cues, Requires tactile cues, Difficulty sequencing, Decreased initiation General Comments: Short Blessed Test: Incorrect time within 1 hour 15 mins and cues to state months of year in reverse order. Pt scoring 3/28 for cues with memory and sequencing. Pt stating "how would I know what time it is without looking at clock?" OTR explained pt was aware that is was the afternoon and required no cues for selecting a time. Pt statin "3:45pm and it was 2:27pm."   Physical Exam: Blood pressure (!) 140/93, pulse 89, temperature 98.2 F (36.8 C), temperature source Oral, resp. rate 18, height 5\' 3"  (1.6 m), weight 60.8 kg, SpO2 94 %. Physical Exam Vitals and nursing note reviewed. Constitutional:      Appearance: Normal appearance. She is normal weight.  She is not ill-appearing.    Comments: Sitting up in bedside chair- husband in room - snacks in room, but bags of snacks full; NAD   HENT:    Head: Normocephalic and atraumatic.    Comments: Smile equal; tongue midline; facial sensation intact    Right Ear: External ear normal.    Left Ear: External ear normal.    Nose: Nose normal. No congestion.    Mouth/Throat:    Mouth: Mucous membranes are dry.    Pharynx: Oropharynx is clear. No oropharyngeal exudate. Eyes:    General:        Right eye: No discharge.        Left eye: No discharge.    Extraocular Movements: Extraocular movements intact.    Comments: A little nystagmus to left  Cardiovascular:    Rate and Rhythm: Regular rhythm. Tachycardia present.    Heart sounds: Normal heart sounds. No murmur heard.   No gallop. Pulmonary:    Comments: CTA B/L- no W/R/R- good air movement     Abdominal:    Comments: Soft, NT, ND, (+)BS - hypoactive  Genitourinary:    Comments: Purewick in place- medium amber urine in container Musculoskeletal:    Cervical back: Normal range of motion and neck supple.    Comments: Right hand with min edema and resolving ecchymosis--small area of clearing at puncture site.  +radial pulse--both hands are cool to touch equally.   RUE- biceps, triceps 4+/5, WE 3+/5, grip 3/5, FA 3-/5 LUE- 4+/5 except FA 4-/5 RLE- HF 4/5, KE 4/5, KF 4+/5, DF/PF 4+/5 LLE- HF 4-/5, KE/KF 4+/5, DF/PF 4+/5   Skin:    General: Skin is warm and dry.    Comments: Bruising - purple around R wrist/hand  Neurological:    Mental Status: She is alert and oriented to person, place, and time.    Comments: Slow to initiate --able to answer basic orientation questions but thought it was August and Wed-->needs cues to correct. She is able to follow simple one step motor commands. Told me was in [facility]; July- 2022- knew last holiday was 4th July. Slightly delayed responses; decreased memory  Psychiatric:    Comments:  Slightly flat  Lab Results Last 48 Hours        Results for orders placed or performed during the hospital encounter of 05/24/21 (from the past 48 hour(s))  CBC     Status: Abnormal    Collection Time: 06/07/21  1:40 AM  Result Value Ref Range    WBC 10.4 4.0 - 10.5 K/uL    RBC 3.37 (L) 3.87 - 5.11 MIL/uL    Hemoglobin 9.9 (L) 12.0 - 15.0 g/dL    HCT 30.5 (L) 36.0 - 46.0 %    MCV 90.5 80.0 - 100.0 fL    MCH 29.4 26.0 - 34.0 pg    MCHC 32.5 30.0 - 36.0 g/dL    RDW 15.7 (H) 11.5 - 15.5 %    Platelets 405 (H) 150 - 400 K/uL    nRBC 0.3 (H) 0.0 - 0.2 %      Comment: Performed at [facility], [address] [place], [place] [address]  Basic metabolic panel     Status: Abnormal    Collection Time: 06/07/21  1:40 AM  Result Value Ref Range    Sodium 133 (L) 135 - 145 mmol/L    Potassium 3.8 3.5 - 5.1 mmol/L    Chloride 105 98 - 111 mmol/L    CO2 20 (L) 22 - 32 mmol/L    Glucose, Bld 90 70 - 99 mg/dL      Comment: Glucose reference range applies only to samples taken after fasting for at least 8 hours.    BUN 16 8 - 23 mg/dL    Creatinine, Ser 0.76 0.44 - 1.00 mg/dL    Calcium 8.7 (L) 8.9 - 10.3 mg/dL    GFR, Estimated >60 >60 mL/min      Comment: (NOTE) Calculated using the CKD-EPI Creatinine Equation (2021)      Anion gap 8 5 - 15      Comment: Performed at [facility] [address] [place], [place] [address]  Magnesium     Status: None    Collection Time: 06/07/21  1:40 AM  Result Value Ref Range    Magnesium 2.1 1.7 - 2.4 mg/dL      Comment: Performed at [facility] [address] [place], [place] [address]      Imaging Results (Last 48 hours)  No results found.           Medical Problem List and Plan: 1.  B/L weakness secondary to R BG and B/L thalamic infarcts             -patient may  shower             -ELOS/Goals: 21-24 days- supervision to min A 2.  Antithrombotics: -DVT/anticoagulation:  Pharmaceutical: Lovenox              -antiplatelet therapy: DAPT X 3 weeks followed by ASA alone.  3. Pain Management: Tylenol prn. Has migraines- cannot take Imitrex- suggest Nurtec prn for migraines 4. Mood: LCSW to follow for evaluation and support.              -antipsychotic agents: N/A 5. Neuropsych: This patient is not  capable of making decisions on his own behalf. 6. Skin/Wound Care: Routine pressure relief measures.  7. Fluids/Electrolytes/Nutrition: Monitor I/O. Check lytes in am. 8. HTN: Monitor BP tid-- 9. CKD III: Has been incontinent since admission. Will monitor voiding with PVR checkss.             --set  toileting schedule. 10. H/o depression/anxiety: Managed with Effexor, Wellbutrin and lorazepam prn. 11. H/o Migraines: controlled on Topamax.  Has had 4x since admission- strongly suggest Nurtec prn; continue Topamax.  12. RML nodule: Recommend repeat CT in 12 months. 13. Hyponatremia: Continue to monitor for now 14. Acute on chronic anemia of chronic disease:  Hgb 11.6 at admission-->9.9 15. FUO: WBC trending up but likely due to steroids.             --Continue to monitor for fevers or other signs of infection. 16. Poor appetite- will monitor- might need appetite stimulant 17. Constipation vs poor appetite- LBM 6 days ago- suggest KUB.      Bary Leriche, PA-C 06/08/2021    I have personally performed a face to face diagnostic evaluation of this patient and formulated the key components of the plan.  Additionally, I have personally reviewed laboratory data, imaging studies, as well as relevant notes and concur with the physician assistant's documentation above.   The patient's status has not changed from the original H&P.  Any changes in documentation from the acute care chart have been noted above.

## 2021-06-08 NOTE — Consult Note (Addendum)
ELECTROPHYSIOLOGY CONSULT NOTE  Patient ID: Michaela Rios MRN: [id number], DOB/AGE: Aug 30, 1951   Admit date: 05/24/2021 Date of Consult: 06/08/2021  Primary Physician: Pincus Sanes, MD Primary Cardiologist: None  Primary Electrophysiologist: New to None  Reason for Consultation: Cryptogenic stroke; recommendations regarding Implantable Loop Recorder Insurance: [employer] Medicare  History of Present Illness EP has been asked to evaluate Michaela Rios for placement of an implantable loop recorder to monitor for atrial fibrillation by Dr Roda Shutters.  The patient was admitted on 05/24/2021 with left sided weakness and fever.    Imaging demonstrated r-sided stroke and left-sided T2 changes.    She has undergone workup for stroke including:  CT 6/29 head right BG/CR infarct MRI 6/29 confirmed right BG/CR infarct CTA head and neck 6/29 - no significant finding MRI 7/6 Numerous small acute bilateral cerebral and cerebellar infarcts compatible with emboli. 2D Echo (6/29):  EF 60-65% TEE 7/7 EF 60-65%, Agitated saline contrast bubble study was positive for a large PFO.  No vegetation of thrombus seen. Low ROPE score indicating a 0% change that stroke is due to PFO.  LDL 45 HgbA1c 5.9 VTE prophylaxis - lovenox No antithrombotic prior to admission, now on aspirin 81 mg daily and clopidogrel 75 mg daily DAPT for 3 weeks and then ASA alone. Therapy recommendations:  CIR   The patient has been monitored on telemetry which has demonstrated sinus rhythm with no arrhythmias.    Echocardiogram as above. Lab work is reviewed.  Prior to admission, the patient denies chest pain, shortness of breath, dizziness, palpitations, or syncope.  She is recovering from her stroke with plans to attend CIR  at discharge.  Past Medical History:  Diagnosis Date   Anemia    Anxiety    Arthritis    Cataract    Chronic migraine    follows with neuro for same   Clotting disorder (HCC)    clot in ovarian vein-  hx   Esophagitis    GERD (gastroesophageal reflux disease)    Hypertension    IBS (irritable bowel syndrome)    Low back pain syndrome    MRSA (methicillin resistant Staphylococcus aureus) infection 04/17/12   left torso   Peptic ulcer disease 2003, 12/2012   Peripheral vascular disease (HCC)    Vitamin D deficiency      Surgical History:  Past Surgical History:  Procedure Laterality Date   ABDOMINAL HYSTERECTOMY     BUBBLE STUDY  06/03/2021   Procedure: BUBBLE STUDY;  Surgeon: Little Ishikawa, MD;  Location: [facility] ENDOSCOPY;  Service: Cardiovascular;;   IR ANGIO EXTERNAL CAROTID SEL EXT CAROTID UNI R MOD SED  06/01/2021   IR ANGIO INTRA EXTRACRAN SEL COM CAROTID INNOMINATE UNI L MOD SED  06/01/2021   IR ANGIO INTRA EXTRACRAN SEL INTERNAL CAROTID UNI R MOD SED  06/01/2021   IR ANGIO VERTEBRAL SEL VERTEBRAL UNI R MOD SED  06/01/2021   IR US GUIDE VASC ACCESS RIGHT  06/01/2021   LAPAROSCOPIC BILATERAL SALPINGO OOPHERECTOMY     pud surgery w/duod sticture-plasty and vagotomy  2003   Dr. Carolynne Edouard   TEE WITHOUT CARDIOVERSION N/A 06/03/2021   Procedure: TRANSESOPHAGEAL ECHOCARDIOGRAM (TEE);  Surgeon: Little Ishikawa, MD;  Location: [facility] ENDOSCOPY;  Service: Cardiovascular;  Laterality: N/A;   TONSILLECTOMY AND ADENOIDECTOMY     as a child   TUBAL LIGATION  1984     Medications Prior to Admission  Medication Sig Dispense Refill Last Dose   acetaminophen (TYLENOL) 650  MG CR tablet Take 650-1,300 mg by mouth every 8 (eight) hours as needed for pain.   Past Week   amLODipine (NORVASC) 5 MG tablet TAKE ONE TABLET BY MOUTH EVERY MORNING 90 tablet 1 Past Week   buPROPion (WELLBUTRIN XL) 300 MG 24 hr tablet TAKE ONE TABLET BY MOUTH EVERY MORNING 90 tablet 2 Past Week   Cholecalciferol (VITAMIN D3 PO) Take 1 tablet by mouth daily.   unk   EPINEPHrine (EPIPEN 2-PAK) 0.3 mg/0.3 mL IJ SOAJ injection Inject 0.3 mLs (0.3 mg total) into the muscle as needed for anaphylaxis. 1 each 0 unk   furosemide  (LASIX) 20 MG tablet TAKE ONE TABLET BY MOUTH EVERY MORNING (Patient taking differently: Take 20 mg by mouth in the morning.) 90 tablet 2 Past Week   hydrALAZINE (APRESOLINE) 25 MG tablet TAKE ONE TABLET BY MOUTH EVERY MORNING and TAKE ONE TABLET BY MOUTH EVERYDAY AT BEDTIME 90 tablet 6 Past Week   LORazepam (ATIVAN) 1 MG tablet Take 1 tablet (1 mg total) by mouth 2 (two) times daily as needed. 180 tablet 0 Past Week   metoprolol succinate (TOPROL-XL) 100 MG 24 hr tablet TAKE ONE TABLET BY MOUTH EVERY MORNING WITH OR immediately following A meal 90 tablet 3 Past Week   ondansetron (ZOFRAN) 4 MG tablet Take 1 tablet (4 mg total) by mouth every 6 (six) hours as needed for nausea. 20 tablet 0 unk   SUMAtriptan (IMITREX) 100 MG tablet Take one tablet (100 mg) by mouth at onset of migraine headache, may repeat in 2 hours if still needed 27 tablet 3 unk   tiZANidine (ZANAFLEX) 4 MG tablet TAKE ONE TABLET BY MOUTH twice daily AS NEEDED FOR muscle SPASMS 30 tablet 1 unk   topiramate (TOPAMAX) 100 MG tablet Take 1.5 tablets (150 mg total) by mouth at bedtime. 135 tablet 1 Past Week   venlafaxine XR (EFFEXOR-XR) 75 MG 24 hr capsule TAKE THREE CAPSULES BY MOUTH EVERY MORNING 270 capsule 6 Past Week   zolpidem (AMBIEN) 10 MG tablet TAKE ONE TABLET BY MOUTH EVERYDAY AT BEDTIME AS NEEDED FOR SLEEP 30 tablet 1 Past Week   nitrofurantoin, macrocrystal-monohydrate, (MACROBID) 100 MG capsule Take 1 capsule (100 mg total) by mouth 2 (two) times daily. (Patient not taking: No sig reported) 14 capsule 0 Completed Course   omeprazole (PRILOSEC) 20 MG capsule Take 1 capsule (20 mg total) by mouth daily. (Patient not taking: No sig reported) 90 capsule 3 Not Taking    Inpatient Medications:   amLODipine  5 mg Oral q AM   aspirin EC  81 mg Oral Daily   buPROPion  300 mg Oral Daily   calcium carbonate  1 tablet Oral Q breakfast   cholecalciferol  1,000 Units Oral Daily   clopidogrel  75 mg Oral Daily   enoxaparin  (LOVENOX) injection  40 mg Subcutaneous Q24H   feeding supplement  237 mL Oral BID BM   hydrALAZINE  25 mg Oral BID   metoprolol succinate  100 mg Oral Daily   multivitamin with minerals  1 tablet Oral Daily   pantoprazole  40 mg Oral Daily   topiramate  150 mg Oral QHS   venlafaxine XR  75 mg Oral Q breakfast   vitamin B-12  1,000 mcg Oral Daily    Allergies:  Allergies  Allergen Reactions   Amitriptyline Hcl     Changed personality   Shellfish Allergy Nausea And Vomiting and Other (See Comments)    Tested "high" on  allergy test   Amoxicillin Hives   Cephalexin Hives   Lisinopril Cough    .   Penicillins Hives    Social History   Socioeconomic History   Marital status: Married    Spouse name: Levada Dy   Number of children: 1   Years of education: Not on file   Highest education level: Not on file  Occupational History   Occupation: retired  Tobacco Use   Smoking status: Never   Smokeless tobacco: Never  Vaping Use   Vaping Use: Never used  Substance and Sexual Activity   Alcohol use: Yes    Comment: rare   Drug use: No   Sexual activity: Yes  Other Topics Concern   Not on file  Social History Narrative   Lives with spouse    Grown son in [place], 2 g kids   Social Determinants of Health   Financial Resource Strain: Not on file  Food Insecurity: No Food Insecurity   Worried About Charity fundraiser in the Last Year: Never true   Arboriculturist in the Last Year: Never true  Transportation Needs: No Transportation Needs   Lack of Transportation (Medical): No   Lack of Transportation (Non-Medical): No  Physical Activity: Inactive   Days of Exercise per Week: 0 days   Minutes of Exercise per Session: 0 min  Stress: Stress Concern Present   Feeling of Stress : Very much  Social Connections: Moderately Isolated   Frequency of Communication with Friends and Family: More than three times a week   Frequency of Social Gatherings with Friends and Family: Never    Attends Religious Services: Never   Marine scientist or Organizations: No   Attends Music therapist: Never   Marital Status: Married  Human resources officer Violence: Not on file     Family History  Problem Relation Age of Onset   Aneurysm Father    Prostate cancer Brother    Lung cancer Brother    Colon cancer Neg Hx    Esophageal cancer Neg Hx    Rectal cancer Neg Hx    Stomach cancer Neg Hx       Review of Systems: All other systems reviewed and are otherwise negative except as noted above.  Physical Exam: Vitals:   06/07/21 2031 06/08/21 0528 06/08/21 0822 06/08/21 0829  BP: (!) 141/114 (!) 138/91 (!) 140/93 (!) 140/93  Pulse: 75 89    Resp: 18 18    Temp: 98.3 F (36.8 C) 98.2 F (36.8 C)    TempSrc: Oral Oral    SpO2: 95% 94%    Weight:      Height:        GEN- The patient is well appearing, alert and oriented x 3 today.   Head- normocephalic, atraumatic Eyes-  Sclera clear, conjunctiva pink Ears- hearing intact Oropharynx- clear Neck- supple Lungs- Clear to ausculation bilaterally, normal work of breathing Heart- Regular rate and rhythm, no murmurs, rubs or gallops  GI- soft, NT, ND, + BS Extremities- no clubbing, cyanosis, or edema MS- no significant deformity or atrophy Skin- no rash or lesion Psych- euthymic mood, full affect   Labs:   Lab Results  Component Value Date   WBC 10.4 06/07/2021   HGB 9.9 (L) 06/07/2021   HCT 30.5 (L) 06/07/2021   MCV 90.5 06/07/2021   PLT 405 (H) 06/07/2021    Recent Labs  Lab 06/07/21 0140  NA 133*  K 3.8  CL  105  CO2 20*  BUN 16  CREATININE 0.76  CALCIUM 8.7*  GLUCOSE 90     Radiology/Studies: CT ANGIO HEAD W OR WO CONTRAST  Result Date: 05/26/2021 CLINICAL DATA:  Stroke suspected. EXAM: CT ANGIOGRAPHY HEAD AND NECK TECHNIQUE: Multidetector CT imaging of the head and neck was performed using the standard protocol during bolus administration of intravenous contrast. Multiplanar CT image  reconstructions and MIPs were obtained to evaluate the vascular anatomy. Carotid stenosis measurements (when applicable) are obtained utilizing NASCET criteria, using the distal internal carotid diameter as the denominator. CONTRAST:  53mL OMNIPAQUE IOHEXOL 350 MG/ML SOLN COMPARISON:  None. FINDINGS: CTA NECK FINDINGS Aortic arch: Standard 3 vessel aortic arch with mild atherosclerotic plaque. Widely patent arch vessel origins. Right carotid system: Patent without evidence of stenosis, dissection, or significant atherosclerosis. Left carotid system: Patent with minimal calcified plaque at the carotid bifurcation. No evidence of dissection or stenosis. Tortuous mid cervical ICA. Vertebral arteries: Patent with the right being slightly larger than the left. No evidence of dissection or stenosis. Skeleton: Advanced disc degeneration at C5-6. Advanced facet arthrosis at C4-5 with grade 1 anterolisthesis. Other neck: No evidence of cervical lymphadenopathy or mass. Upper chest: No apical lung consolidation or mass. Review of the MIP images confirms the above findings CTA HEAD FINDINGS Anterior circulation: The internal carotid arteries are patent from skull base to carotid termini with minimal nonstenotic plaque. ACAs and MCAs are patent without evidence of a proximal branch occlusion or significant proximal stenosis. No aneurysm is identified. Posterior circulation: The intracranial vertebral arteries are widely patent to the basilar. Patent bilateral PICA, left AICA, and bilateral SCA origins are visualized. A right AICA is not clearly identified. The basilar artery is widely patent. There are small posterior communicating arteries bilaterally. Both PCAs are patent without evidence of a significant proximal stenosis. No aneurysm is identified. Venous sinuses: As permitted by contrast timing, patent. Anatomic variants: None. Review of the MIP images confirms the above findings IMPRESSION: 1. Minimal atherosclerosis in  the head and neck without large vessel occlusion or significant stenosis. 2. Aortic Atherosclerosis (ICD10-I70.0). Electronically Signed   By: Logan Bores M.D.   On: 05/26/2021 20:00   DG Chest 2 View  Result Date: 05/24/2021 CLINICAL DATA:  Fever and urinary frequency EXAM: CHEST - 2 VIEW COMPARISON:  08/17/2020 FINDINGS: The heart size and mediastinal contours are within normal limits. Both lungs are clear. The visualized skeletal structures are unremarkable. IMPRESSION: No active cardiopulmonary disease. Electronically Signed   By: Franchot Gallo M.D.   On: 05/24/2021 14:41   CT HEAD WO CONTRAST  Result Date: 05/26/2021 CLINICAL DATA:  Headache. EXAM: CT HEAD WITHOUT CONTRAST TECHNIQUE: Contiguous axial images were obtained from the base of the skull through the vertex without intravenous contrast. COMPARISON:  08/17/2020 FINDINGS: Brain: There is new multifocal hypoattenuation in the basal ganglia and thalami bilaterally. A band of low-density involving the right caudate and lentiform nuclei has an appearance suggestive of an acute lateral lenticulostriate territory infarct. No acute cortically based infarct, intracranial hemorrhage, midline shift, or extra-axial fluid collection is identified. Hypodensities in the periventricular white matter bilaterally are nonspecific but compatible with mild chronic small vessel ischemic disease. Vascular: No hyperdense vessel. Skull: No fracture or suspicious osseous lesion. Sinuses/Orbits: Visualized paranasal sinuses and mastoid air cells are clear. Unremarkable orbits. Other: None. IMPRESSION: New multifocal hypoattenuation in the basal ganglia and thalami bilaterally of indeterminate etiology. Considerations include multifocal infarcts (including venous infarcts), toxic/metabolic insult, hypoxic-ischemic injury, and  infection. No mass effect to suggest tumor. Correlate with pending MRI. Electronically Signed   By: Logan Bores M.D.   On: 05/26/2021 15:20   CT  ANGIO NECK W OR WO CONTRAST  Result Date: 05/26/2021 CLINICAL DATA:  Stroke suspected. EXAM: CT ANGIOGRAPHY HEAD AND NECK TECHNIQUE: Multidetector CT imaging of the head and neck was performed using the standard protocol during bolus administration of intravenous contrast. Multiplanar CT image reconstructions and MIPs were obtained to evaluate the vascular anatomy. Carotid stenosis measurements (when applicable) are obtained utilizing NASCET criteria, using the distal internal carotid diameter as the denominator. CONTRAST:  25mL OMNIPAQUE IOHEXOL 350 MG/ML SOLN COMPARISON:  None. FINDINGS: CTA NECK FINDINGS Aortic arch: Standard 3 vessel aortic arch with mild atherosclerotic plaque. Widely patent arch vessel origins. Right carotid system: Patent without evidence of stenosis, dissection, or significant atherosclerosis. Left carotid system: Patent with minimal calcified plaque at the carotid bifurcation. No evidence of dissection or stenosis. Tortuous mid cervical ICA. Vertebral arteries: Patent with the right being slightly larger than the left. No evidence of dissection or stenosis. Skeleton: Advanced disc degeneration at C5-6. Advanced facet arthrosis at C4-5 with grade 1 anterolisthesis. Other neck: No evidence of cervical lymphadenopathy or mass. Upper chest: No apical lung consolidation or mass. Review of the MIP images confirms the above findings CTA HEAD FINDINGS Anterior circulation: The internal carotid arteries are patent from skull base to carotid termini with minimal nonstenotic plaque. ACAs and MCAs are patent without evidence of a proximal branch occlusion or significant proximal stenosis. No aneurysm is identified. Posterior circulation: The intracranial vertebral arteries are widely patent to the basilar. Patent bilateral PICA, left AICA, and bilateral SCA origins are visualized. A right AICA is not clearly identified. The basilar artery is widely patent. There are small posterior communicating  arteries bilaterally. Both PCAs are patent without evidence of a significant proximal stenosis. No aneurysm is identified. Venous sinuses: As permitted by contrast timing, patent. Anatomic variants: None. Review of the MIP images confirms the above findings IMPRESSION: 1. Minimal atherosclerosis in the head and neck without large vessel occlusion or significant stenosis. 2. Aortic Atherosclerosis (ICD10-I70.0). Electronically Signed   By: Logan Bores M.D.   On: 05/26/2021 20:00   CT Angio Chest PE W/Cm &/Or Wo Cm  Result Date: 05/24/2021 CLINICAL DATA:  Elevated D-dimer, tachycardia EXAM: CT ANGIOGRAPHY CHEST WITH CONTRAST TECHNIQUE: Multidetector CT imaging of the chest was performed using the standard protocol during bolus administration of intravenous contrast. Multiplanar CT image reconstructions and MIPs were obtained to evaluate the vascular anatomy. CONTRAST:  74mL OMNIPAQUE IOHEXOL 350 MG/ML SOLN COMPARISON:  None. FINDINGS: Cardiovascular: Normal heart size. No pericardial effusion. Thoracic aorta is normal in caliber minor calcified plaque. Satisfactory opacification of the pulmonary arteries to the proximal segmental level. No evidence of acute pulmonary embolism. Mediastinum/Nodes: No enlarged mediastinal or hilar lymph nodes. Mildly prominent but nonenlarged axillary and subpectoral lymph nodes. Lungs/Pleura: No pleural effusion or pneumothorax. There is a 4 mm peripheral nodular opacity of the right middle lobe (series 7, image 58). Upper Abdomen: No acute abnormality. Postoperative changes at the gastroesophageal junction. Musculoskeletal: Degenerative changes of the spine. No acute osseous abnormality. Review of the MIP images confirms the above findings. IMPRESSION: No evidence of acute pulmonary embolism or other acute abnormality. 4 mm right middle lobe peripheral nodular opacity. No follow-up needed if patient is low-risk. Non-contrast chest CT can be considered in 12 months if patient is  high-risk. This recommendation follows the consensus statement:  Guidelines for Management of Incidental Pulmonary Nodules Detected on CT Images: From the Fleischner Society 2017; Radiology 2017; [id number]:[id number]. Electronically Signed   By: Macy Mis M.D.   On: 05/24/2021 16:41   MR BRAIN WO CONTRAST  Result Date: 05/26/2021 CLINICAL DATA:  Acute neuro deficit.  Abnormal CT.  Fever EXAM: MRI HEAD WITHOUT CONTRAST TECHNIQUE: Multiplanar, multiecho pulse sequences of the brain and surrounding structures were obtained without intravenous contrast. COMPARISON:  CT head 02/29/2022, 08/17/2020 FINDINGS: Brain: Focal area of restricted diffusion in the right lenticulostriate distribution compatible with acute infarct. This area also shows increased signal on T2 and FLAIR. Mild edema in the deep white matter tracts on the right. Small amount of FLAIR hyperintensity right medial thalamus. Additional areas of increased signal on T2 and FLAIR involving the head of caudate on the left and left thalamus without restricted diffusion. There is mild enlargement of the head of the caudate on the left. There is also asymmetric white matter hyperintensity in the deep white matter tracks of the left external capsule and left internal capsule. These areas do not show restricted diffusion. Ventricle size normal.  No midline shift.  Negative for hemorrhage Vascular: Normal arterial flow voids. Skull and upper cervical spine: Negative Sinuses/Orbits: Minimal mucosal edema paranasal sinuses. Negative orbit Other: None IMPRESSION: Restricted diffusion in the right basal ganglia corresponding to lenticulostriate territory. This is most consistent with acute infarction Hyperintensity and mild enlargement of the left head of caudate. Hyperintensity also in the left thalamus and a small amount of hyperintensity in the right medial thalamus. There is vasogenic type edema in the deep white matter structures on the left and to lesser extent in  the deep white matter structures on the right. No restricted diffusion on the left. This is an unusual pattern. I would recommend postcontrast imaging of the brain to rule out tumor on the left. Subacute ischemia on the left appears unlikely given the vasogenic edema and unusual appearance of the head of the caudate and thalamus on the left. Also consider infection/cerebritis. Opportunistic infection such as toxoplasmosis possible. These results were called by telephone at the time of interpretation on 05/26/2021 at 4:10 pm to provider [facility], who verbally acknowledged these results. Electronically Signed   By: Franchot Gallo M.D.   On: 05/26/2021 16:10   MR BRAIN W CONTRAST  Result Date: 05/26/2021 CLINICAL DATA:  Follow-up abnormal brain MRI earlier today. EXAM: MRI HEAD WITH CONTRAST TECHNIQUE: Multiplanar, multiecho pulse sequences of the brain and surrounding structures were obtained with intravenous contrast. CONTRAST:  6.56mL GADAVIST GADOBUTROL 1 MMOL/ML IV SOLN COMPARISON:  Noncontrast brain MRI earlier today FINDINGS: There is very mild enhancement within the previously seen regions of signal abnormality in the basal ganglia and thalami. Much of this enhancement appears vascular and perivascular. No solid enhancing mass is present. There is focal abnormal leptomeningeal enhancement over the right parietal convexity (series 6, image 6), and there may also be minimal leptomeningeal enhancement over the left parietal convexity (series 6, image 19). IMPRESSION: Mild, largely vascular/perivascular appearing enhancement in the areas of signal abnormality in the deep gray nuclei as well as a small amount of leptomeningeal enhancement over the right and possibly left parietal convexities. No solid enhancing mass. Differential diagnosis includes infection (including cryptococcus), vasculitis, unusual neoplasm (such as angiocentric lymphoma), and granulomatous/autoimmune/other inflammatory conditions.  Electronically Signed   By: Logan Bores M.D.   On: 05/26/2021 20:30   MR BRAIN W WO CONTRAST  Result Date: 06/02/2021 CLINICAL DATA:  Stroke follow-up. Negative diagnostic cerebral angiogram yesterday. EXAM: MRI HEAD WITHOUT AND WITH CONTRAST TECHNIQUE: Multiplanar, multiecho pulse sequences of the brain and surrounding structures were obtained without and with intravenous contrast. CONTRAST:  36mL GADAVIST GADOBUTROL 1 MMOL/ML IV SOLN COMPARISON:  05/26/2021 FINDINGS: Brain: There are multiple subcentimeter acute infarcts scattered throughout both cerebral hemispheres primarily involving cortex and subcortical white matter of all lobes. Small acute infarcts are also present in the left caudate head, both thalami, and right and possibly left cerebellar hemispheres. Restricted diffusion and T2 hyperintensity in the right basal ganglia have mildly decreased. T2 hyperintensity involving the left basal ganglia, left greater than right thalami, and left greater than right internal and external capsules has also decreased with decreased enlargement/swelling of the left caudate and left thalamus. There is some intrinsic T1 hyperintensity associated with the right basal ganglia infarct as well as some true, mild hazy as well as vascular appearing enhancement. The small amount of scattered enhancement within the left basal ganglia and left thalamus on the prior MRI has decreased. There may be a small amount of residual leptomeningeal enhancement within biparietal sulci (lack of sagittal T1 postcontrast imaging on today's study limits direct comparison). There is also minimal persistent smooth pachymeningeal enhancement over both cerebral convexities. There is mild generalized cerebral atrophy. No intracranial hemorrhage, midline shift, or extra-axial fluid collection is evident. Vascular: Major intracranial vascular flow voids are preserved. Skull and upper cervical spine: Unremarkable bone marrow signal. Sinuses/Orbits:  Unremarkable orbits. Paranasal sinuses and mastoid air cells are clear. Other: None. IMPRESSION: 1. 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 2. Expected evolution of right basal ganglia infarct. 3. Decreased T2 signal abnormality and enhancement in the left greater than right deep gray nuclei and deep white matter tracts with differential considerations as previously outlined. Electronically Signed   By: Logan Bores M.D.   On: 06/02/2021 15:56   CT ABDOMEN PELVIS W CONTRAST  Result Date: 05/25/2021 CLINICAL DATA:  Abdominal pain, fever EXAM: CT ABDOMEN AND PELVIS WITH CONTRAST TECHNIQUE: Multidetector CT imaging of the abdomen and pelvis was performed using the standard protocol following bolus administration of intravenous contrast. CONTRAST:  148mL OMNIPAQUE IOHEXOL 300 MG/ML  SOLN COMPARISON:  05/24/2021 FINDINGS: Lower chest: No acute abnormality. Hepatobiliary: Diffuse low-density throughout the liver compatible with fatty infiltration. No focal abnormality. Gallbladder unremarkable. Pancreas: No focal abnormality or ductal dilatation. Spleen: No focal abnormality.  Normal size. Adrenals/Urinary Tract: No adrenal abnormality. No focal renal abnormality. No stones or hydronephrosis. Urinary bladder is unremarkable. Stomach/Bowel: Normal appendix. Stomach, large and small bowel grossly unremarkable. Vascular/Lymphatic: No evidence of aneurysm or adenopathy. Reproductive: Prior hysterectomy.  No adnexal masses. Other: No free fluid or free air. Musculoskeletal: No acute bony abnormality. IMPRESSION: Hepatic steatosis. No acute findings in the abdomen or pelvis. Electronically Signed   By: Rolm Baptise M.D.   On: 05/25/2021 19:07   IR US Guide Vasc Access Right  Result Date: 06/01/2021 INDICATION: 70 year old female with past medical history significant for hypertension presenting with left-sided weakness and fever. 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 Given the possibility of CNS vasculitis, a diagnostic cerebral angiogram was requested. EXAM: ULTRASOUND-GUIDED VASCULAR ACCESS DIAGNOSTIC CEREBRAL ANGIOGRAM COMPARISON:  CT/CT angiogram of the head and neck May 26, 2021 MEDICATIONS: 5,000 IU heparin, 5 mg Verapamil and 200 mcg nitroglycerin ANESTHESIA/SEDATION: Versed 1 mg IV; Fentanyl 25 mcg IV Moderate Sedation Time:  46 minutes The patient was continuously monitored during the procedure by the interventional radiology nurse under my direct supervision. CONTRAST:  78 mL of Omnipaque 300 milligrams/mL FLUOROSCOPY TIME:  Fluoroscopy Time: 11 minutes 6 seconds (331 mGy). COMPLICATIONS: None immediate. TECHNIQUE: Informed written consent was obtained from the patient after a thorough discussion of the procedural risks, benefits and alternatives. All questions were addressed. Maximal Sterile Barrier Technique was utilized including caps, mask, sterile gowns, sterile gloves, sterile drape, hand hygiene and skin antiseptic. A timeout was performed prior to the initiation of the procedure. Using the modified Seldinger technique and a micropuncture kit, access was gained to the distal right radial artery at the anatomical snuffbox and a 5 French sheath was placed. Slow intra arterial infusion of 5,000 IU heparin, 5 mg Verapamil and 200 mcg nitroglycerin diluted in patient's own blood was performed. No significant fluctuation in patient's blood pressure seen. Then, a right radial artery roadmap was obtained via sheath side port. Normal brachial artery branching pattern seen. No significant anatomical variation. The right radial artery caliber is adequate for vascular access. Next, a 5 [place] Simmons 2 glide catheter was navigated over a 0.035" Terumo Glidewire into the right subclavian artery under fluoroscopic guidance. Under fluoroscopy the catheter was placed into the right vertebral  artery. Frontal, lateral and magnified bilateral oblique views of the head were obtained. The catheter was then placed into the right common carotid artery. Frontal and lateral views of the neck were obtained. Under biplane roadmap, the catheter was placed into the right internal carotid artery. Frontal, lateral and magnified oblique views of the head were obtained. The catheter was subsequently placed into the right external carotid artery. Frontal and lateral angiograms of the head were obtained. Next, the catheter was placed into the left common carotid artery. Frontal and lateral views of the neck were obtained followed by frontal, lateral and magnified oblique views of the head. The catheter was subsequently withdrawn. An inflatable band was placed and inflated over the right hand access site. The vascular sheath was withdrawn and the band was slowly deflated until brisk flow was noted through the arteriotomy site. At this point, the band was reinflated with additional 2 cc of air to obtain patent hemostasis. FINDINGS: Right radial artery ultrasound and right radial artery angiogram: The caliber of the distal right radial artery is appropriate for angiogram access. The right radial artery and the right ulnar artery have normal course and caliber. No significant anatomical variants noted. Right vertebral artery angiograms: The right vertebral artery, basilar artery, and bilateral posterior cerebral arteries are unremarkable. The left vertebral artery seen by contrast reflux and also appear unremarkable. Luminal caliber is smooth and tapering. No aneurysms or abnormally high-flow, early draining veins are seen. No regions of abnormal hypervascularity are noted. The visualized dural sinuses are patent. Right CCA angiograms: Cervical angiograms show normal course and caliber of the visualized right common carotid and internal carotid arteries. There are no significant stenoses. Right ICA angiograms: There is brisk  vascular contrast filling of the ACA and MCA vascular trees. Luminal caliber is smooth and tapering. No aneurysms or abnormally high-flow, early draining veins are seen. No regions of abnormal hypervascularity are noted. The visualized dural sinuses are patent. Right ECA and right occipital angiograms: No early venous drainage was noted. The visualized branches of the right external carotid artery are unremarkable. Left CCA angiograms: Cervical angiograms show normal caliber of the visualized left common carotid and internal carotid arteries. Increased tortuosity of the  mid cervical segment of the left ICA with mild luminal irregularity. There are no significant stenoses. Left CCA angiograms - cranial views: There is brisk vascular contrast filling of the ACA and MCA vascular trees. Luminal caliber is smooth and tapering. No aneurysms or abnormally high-flow, early draining veins are seen. No regions of abnormal hypervascularity are noted. The visualized dural sinuses are patent. The visualized branches of the left external carotid artery are unremarkable. PROCEDURE: No intervention performed. IMPRESSION: 1. No angiographic evidence of vasculitis. 2. No significant intracranial stenosis, vasospasm, vascular occlusion, aneurysm, AVM, dural AV fistula or other significant vascular abnormality. PLAN: Results communicated to the neurology team. Electronically Signed   By: Pedro Earls M.D.   On: 06/01/2021 17:01   EEG adult  Result Date: 05/27/2021 Lora Havens, MD     05/27/2021  6:42 PM Patient Name: Michaela Rios MRN: [id number] Epilepsy Attending: Lora Havens Referring Physician/Provider: Dr Rosalin Hawking Date: 05/27/2021 Duration: 24.11 mins Patient history: 70yo F presented with falling at home, generalized weakness, fever, difficult waking with imbalance, lack of appetite. MRI showed hyperintensity and mild enlargement of the left head of caudate. EEG to evaluate for seizure Level of  alertness: Awake AEDs during EEG study: TPM Technical aspects: This EEG study was done with scalp electrodes positioned according to the 10-20 International system of electrode placement. Electrical activity was acquired at a sampling rate of $Remov'500Hz'Qkwpjw$  and reviewed with a high frequency filter of $RemoveB'70Hz'XqVJeZMe$  and a low frequency filter of $RemoveB'1Hz'yEwMWMpm$ . EEG data were recorded continuously and digitally stored. Description: The posterior dominant rhythm consists of 9 Hz activity of moderate voltage (25-35 uV) seen predominantly in posterior head regions, symmetric and reactive to eye opening and eye closing. EEG showed intermittent 3 to 6 Hz theta-delta slowing in left temporal  region. Single sharp transient was seen in left temporal region. Hyperventilation and photic stimulation were not performed.   ABNORMALITY - Intermittent slow, generalized IMPRESSION: This study is suggestive of cortical dysfunction arising from left temporal region, nonspecific etiology. No seizures or definite epileptiform discharges were seen throughout the recording. If suspicion for interictal activity remains a concern, a prolonged study including sleep should be considered. Priyanka O Yadav   VAS [place] UPPER EXTREMITY ARTERIAL DUPLEX  Result Date: 06/02/2021  UPPER EXTREMITY DUPLEX STUDY Patient Name:  Michaela Rios [facility]  Date of Exam:   06/01/2021 Medical Rec #: [id number]          Accession #:    [id number] Date of Birth: [birth year]          Patient Gender: F Patient Age:   69Y Exam Location:  [facility] Procedure:      VAS [place] UPPER EXTREMITY ARTERIAL DUPLEX Referring Phys: [place] --------------------------------------------------------------------------------  Indications: Bruising and Pain. History:     Patient has a history of catheterization via left radial artery.  Risk Factors: Hypertension. Limitations: TR band in place on the right wrist. Comparison Study: No prior studies. Performing Technologist: Oliver Hum RVT   Examination Guidelines: A complete evaluation includes B-mode imaging, spectral Doppler, color Doppler, and power Doppler as needed of all accessible portions of each vessel. Bilateral testing is considered an integral part of a complete examination. Limited examinations for reoccurring indications may be performed as noted.  Right Doppler Findings: +---------------+----------+---------+--------+--------+ Site           PSV (cm/s)Waveform StenosisComments +---------------+----------+---------+--------+--------+ Subclavian Dist          triphasic                 +---------------+----------+---------+--------+--------+  Brachial Dist            triphasic                 +---------------+----------+---------+--------+--------+ Radial Dist              triphasic                 +---------------+----------+---------+--------+--------+ Ulnar Dist               triphasic                 +---------------+----------+---------+--------+--------+ Palmar Arch              triphasic                 +---------------+----------+---------+--------+--------+ There is no evidence of a pseudoaneurysm. There is evidence of a channel leading off of the distal radial artery. The channel is located beneath the TR band.   Electronically signed by Jamelle Haring on 06/02/2021 at 5:21:27 PM.    Final    ECHOCARDIOGRAM COMPLETE  Result Date: 05/26/2021    ECHOCARDIOGRAM REPORT   Patient Name:   Michaela Rios [facility] Date of Exam: 05/26/2021 Medical Rec #:  [id number]         Height:       63.0 in Accession #:    [id number]        Weight:       134.0 lb Date of Birth:  16-May-1951         BSA:          1.631 m Patient Age:    70 years          BP:           140/97 mmHg Patient Gender: F                 HR:           94 bpm. Exam Location:  Inpatient Procedure: 2D Echo, Cardiac Doppler and Color Doppler Indications:    Stroke  History:        Patient has no prior history of Echocardiogram examinations,                  most recent 08/18/2020. Risk Factors:Hypertension. CKD. Sepsis.  Sonographer:    Clayton Lefort RDCS (AE) Referring Phys: [id number] BELKYS A REGALADO IMPRESSIONS  1. Left ventricular ejection fraction, by estimation, is 60 to 65%. The left ventricle has normal function. The left ventricle has no regional wall motion abnormalities. There is moderate left ventricular hypertrophy. Left ventricular diastolic parameters are consistent with Grade I diastolic dysfunction (impaired relaxation).  2. Right ventricular systolic function is normal. The right ventricular size is normal. Tricuspid regurgitation signal is inadequate for assessing PA pressure.  3. The mitral valve is normal in structure. Mild mitral valve regurgitation. No evidence of mitral stenosis.  4. The aortic valve is grossly normal. There is mild calcification of the aortic valve. Aortic valve regurgitation is not visualized. Mild aortic valve sclerosis is present, with no evidence of aortic valve stenosis.  5. The inferior vena cava is normal in size with greater than 50% respiratory variability, suggesting right atrial pressure of 3 mmHg. Conclusion(s)/Recommendation(s): No intracardiac source of embolism detected on this transthoracic study. A transesophageal echocardiogram is recommended to exclude cardiac source of embolism if clinically indicated. FINDINGS  Left Ventricle: Left ventricular ejection fraction, by estimation, is 60 to 65%. The left ventricle has normal function. The left ventricle has  no regional wall motion abnormalities. The left ventricular internal cavity size was normal in size. There is  moderate left ventricular hypertrophy. Left ventricular diastolic parameters are consistent with Grade I diastolic dysfunction (impaired relaxation). Right Ventricle: The right ventricular size is normal. No increase in right ventricular wall thickness. Right ventricular systolic function is normal. Tricuspid regurgitation signal is inadequate for  assessing PA pressure. Left Atrium: Left atrial size was normal in size. Right Atrium: Right atrial size was normal in size. Pericardium: There is no evidence of pericardial effusion. Mitral Valve: The mitral valve is normal in structure. Mild mitral valve regurgitation. No evidence of mitral valve stenosis. MV peak gradient, 2.9 mmHg. The mean mitral valve gradient is 1.0 mmHg. Tricuspid Valve: The tricuspid valve is normal in structure. Tricuspid valve regurgitation is not demonstrated. No evidence of tricuspid stenosis. Aortic Valve: The aortic valve is grossly normal. There is mild calcification of the aortic valve. Aortic valve regurgitation is not visualized. Mild aortic valve sclerosis is present, with no evidence of aortic valve stenosis. Aortic valve mean gradient  measures 3.0 mmHg. Aortic valve peak gradient measures 5.8 mmHg. Aortic valve area, by VTI measures 2.97 cm. Pulmonic Valve: The pulmonic valve was normal in structure. Pulmonic valve regurgitation is trivial. No evidence of pulmonic stenosis. Aorta: The aortic root is normal in size and structure. Venous: The inferior vena cava is normal in size with greater than 50% respiratory variability, suggesting right atrial pressure of 3 mmHg. IAS/Shunts: No atrial level shunt detected by color flow Doppler.  LEFT VENTRICLE PLAX 2D LVIDd:         3.40 cm  Diastology LVIDs:         2.20 cm  LV e' medial:    8.70 cm/s LV PW:         1.30 cm  LV E/e' medial:  7.3 LV IVS:        1.40 cm  LV e' lateral:   13.20 cm/s LVOT diam:     2.00 cm  LV E/e' lateral: 4.8 LV SV:         65 LV SV Index:   40 LVOT Area:     3.14 cm  RIGHT VENTRICLE             IVC RV Basal diam:  2.60 cm     IVC diam: 1.30 cm RV S prime:     18.50 cm/s TAPSE (M-mode): 2.8 cm LEFT ATRIUM           Index       RIGHT ATRIUM           Index LA diam:      2.50 cm 1.53 cm/m  RA Area:     12.50 cm LA Vol (A2C): 38.7 ml 23.73 ml/m RA Volume:   25.60 ml  15.70 ml/m LA Vol (A4C): 24.4 ml 14.96  ml/m  AORTIC VALVE AV Area (Vmax):    2.96 cm AV Area (Vmean):   2.80 cm AV Area (VTI):     2.97 cm AV Vmax:           120.00 cm/s AV Vmean:          84.100 cm/s AV VTI:            0.219 m AV Peak Grad:      5.8 mmHg AV Mean Grad:      3.0 mmHg LVOT Vmax:         113.00 cm/s LVOT Vmean:  74.900 cm/s LVOT VTI:          0.207 m LVOT/AV VTI ratio: 0.95  AORTA Ao Root diam: 3.30 cm Ao Asc diam:  3.40 cm MITRAL VALVE MV Area (PHT): 3.68 cm    SHUNTS MV Area VTI:   3.39 cm    Systemic VTI:  0.21 m MV Peak grad:  2.9 mmHg    Systemic Diam: 2.00 cm MV Mean grad:  1.0 mmHg MV Vmax:       0.85 m/s MV Vmean:      54.7 cm/s MV Decel Time: 206 msec MV E velocity: 63.40 cm/s MV A velocity: 76.00 cm/s MV E/A ratio:  0.83 Cherlynn Kaiser MD Electronically signed by Cherlynn Kaiser MD Signature Date/Time: 05/26/2021/7:31:57 PM    Final    CT Renal Stone Study  Result Date: 05/24/2021 CLINICAL DATA:  Flank pain, urinary frequency EXAM: CT ABDOMEN AND PELVIS WITHOUT CONTRAST TECHNIQUE: Multidetector CT imaging of the abdomen and pelvis was performed following the standard protocol without IV contrast. COMPARISON:  None available FINDINGS: Lower chest: No acute abnormality. Hepatobiliary: Limited without IV contrast. Punctate hepatic dome calcified granuloma anteriorly. No large focal hepatic abnormality intrahepatic biliary dilatation. Gallbladder unremarkable. Common bile duct nondilated. Pancreas: Unremarkable. No pancreatic ductal dilatation or surrounding inflammatory changes. Spleen: Normal in size without focal abnormality. Adrenals/Urinary Tract: Adrenal glands are unremarkable. Kidneys are normal, without renal calculi, focal lesion, or hydronephrosis. Bladder is unremarkable. Stomach/Bowel: Negative for bowel obstruction, significant dilatation, ileus, or free air. Normal appendix demonstrated. No free fluid, fluid collection, hemorrhage, hematoma abscess, ascites. Vascular/Lymphatic: Minor aortic  atherosclerosis and tortuosity. Negative for aneurysm. Limited assessment without IV contrast. Mild bilateral external iliac adenopathy, nonspecific. Left external iliac has a short axis diameter 1.3 cm. No inguinal adenopathy. No significant retroperitoneal adenopathy. Reproductive: Remote hysterectomy. Calcifications noted in the pelvis appearing benign or postoperative. No adnexal abnormality. No pelvic free fluid. Other: Small fat containing umbilical hernia. No large ventral hernia. No inguinal hernia. Negative for ascites. Musculoskeletal: Thoracolumbar degenerative changes. Lower lumbar facet arthropathy. No acute osseous finding. IMPRESSION: No acute obstructing urinary tract or ureteral calculus. No obstructive uropathy or hydronephrosis. No other acute intra-abdominal or pelvic finding by noncontrast CT. Nonspecific bilateral mild external iliac adenopathy, short axis diameters measure up to 1.3 cm on the left. Aortic Atherosclerosis (ICD10-I70.0). Electronically Signed   By: Jerilynn Mages.  Shick M.D.   On: 05/24/2021 11:47   ECHO TEE  Result Date: 06/03/2021    TRANSESOPHOGEAL ECHO REPORT   Patient Name:   Michaela Rios [facility] Date of Exam: 06/03/2021 Medical Rec #:  [id number]         Height:       63.0 in Accession #:    [id number]        Weight:       134.0 lb Date of Birth:  Sep 12, 1951         BSA:          1.631 m Patient Age:    25 years          BP:           152/96 mmHg Patient Gender: F                 HR:           100 bpm. Exam Location:  Inpatient Procedure: Transesophageal Echo, Color Doppler and Cardiac Doppler Indications:     stroke  History:         Patient has  prior history of Echocardiogram examinations, most                  recent 05/26/2021. Risk Factors:Hypertension.  Sonographer:     Johny Chess Referring Phys:  [id number] PAULA V ROSS Diagnosing Phys: Oswaldo Milian MD PROCEDURE: After discussion of the risks and benefits of a TEE, an informed consent was obtained from the patient. The  transesophogeal probe was passed without difficulty through the esophogus of the patient. Local oropharyngeal anesthetic was provided with Cetacaine. Sedation performed by different physician. The patient was monitored while under deep sedation. Anesthestetic sedation was provided intravenously by Anesthesiology: 242mg  of Propofol. The patient developed no complications during the procedure. IMPRESSIONS  1. Left ventricular ejection fraction, by estimation, is 60 to 65%. The left ventricle has normal function.  2. Right ventricular systolic function is normal. The right ventricular size is normal.  3. No left atrial/left atrial appendage thrombus was detected.  4. The mitral valve is normal in structure. Trivial mitral valve regurgitation.  5. The aortic valve is tricuspid. Aortic valve regurgitation is not visualized.  6. Agitated saline contrast bubble study was positive with shunting observed within 3-6 cardiac cycles suggestive of interatrial shunt. Significant shunting seen, suggestive of large patent foramen ovale. FINDINGS  Left Ventricle: Left ventricular ejection fraction, by estimation, is 60 to 65%. The left ventricle has normal function. The left ventricular internal cavity size was normal in size. Right Ventricle: The right ventricular size is normal. No increase in right ventricular wall thickness. Right ventricular systolic function is normal. Left Atrium: Left atrial size was normal in size. No left atrial/left atrial appendage thrombus was detected. Right Atrium: Right atrial size was normal in size. Pericardium: There is no evidence of pericardial effusion. Mitral Valve: The mitral valve is normal in structure. Trivial mitral valve regurgitation. Tricuspid Valve: The tricuspid valve is normal in structure. Tricuspid valve regurgitation is trivial. Aortic Valve: The aortic valve is tricuspid. Aortic valve regurgitation is not visualized. Pulmonic Valve: The pulmonic valve was grossly normal.  Pulmonic valve regurgitation is mild. Aorta: The aortic root and ascending aorta are structurally normal, with no evidence of dilitation. IAS/Shunts: No atrial level shunt detected by color flow Doppler. Agitated saline contrast was given intravenously to evaluate for intracardiac shunting. Agitated saline contrast bubble study was positive with shunting observed within 3-6 cardiac cycles suggestive of interatrial shunt. A large patent foramen ovale is detected. Oswaldo Milian MD Electronically signed by Oswaldo Milian MD Signature Date/Time: 06/03/2021/3:09:44 PM    Final    VAS [place] LOWER EXTREMITY VENOUS (DVT)  Result Date: 05/25/2021  Lower Venous DVT Study Patient Name:  Michaela Rios [facility]  Date of Exam:   05/25/2021 Medical Rec #: [id number]          Accession #:    [id number] Date of Birth: Dec 15, 1950          Patient Gender: F Patient Age:   69Y Exam Location:  [facility] Procedure:      VAS [place] LOWER EXTREMITY VENOUS (DVT) Referring Phys: [id number] BELKYS A REGALADO --------------------------------------------------------------------------------  Indications: Edema.  Comparison Study: no prior Performing Technologist: Archie Patten RVS  Examination Guidelines: A complete evaluation includes B-mode imaging, spectral Doppler, color Doppler, and power Doppler as needed of all accessible portions of each vessel. Bilateral testing is considered an integral part of a complete examination. Limited examinations for reoccurring indications may be performed as noted. The reflux portion of the exam is performed with the patient in  reverse Trendelenburg.  +---------+---------------+---------+-----------+----------+--------------+ RIGHT    CompressibilityPhasicitySpontaneityPropertiesThrombus Aging +---------+---------------+---------+-----------+----------+--------------+ CFV      Full           Yes      Yes                                  +---------+---------------+---------+-----------+----------+--------------+ SFJ      Full                                                        +---------+---------------+---------+-----------+----------+--------------+ FV Prox  Full                                                        +---------+---------------+---------+-----------+----------+--------------+ FV Mid   Full                                                        +---------+---------------+---------+-----------+----------+--------------+ FV DistalFull                                                        +---------+---------------+---------+-----------+----------+--------------+ PFV      Full                                                        +---------+---------------+---------+-----------+----------+--------------+ POP      Full           Yes      Yes                                 +---------+---------------+---------+-----------+----------+--------------+ PTV      Full                                                        +---------+---------------+---------+-----------+----------+--------------+ PERO     Full                                                        +---------+---------------+---------+-----------+----------+--------------+   +---------+---------------+---------+-----------+----------+--------------+ LEFT     CompressibilityPhasicitySpontaneityPropertiesThrombus Aging +---------+---------------+---------+-----------+----------+--------------+ CFV      Full           Yes      Yes                                 +---------+---------------+---------+-----------+----------+--------------+  SFJ      Full                                                        +---------+---------------+---------+-----------+----------+--------------+ FV Prox  Full                                                         +---------+---------------+---------+-----------+----------+--------------+ FV Mid   Full                                                        +---------+---------------+---------+-----------+----------+--------------+ FV DistalFull                                                        +---------+---------------+---------+-----------+----------+--------------+ PFV      Full                                                        +---------+---------------+---------+-----------+----------+--------------+ POP      Full           Yes      Yes                                 +---------+---------------+---------+-----------+----------+--------------+ PTV      Full                                                        +---------+---------------+---------+-----------+----------+--------------+ PERO     Full                                                        +---------+---------------+---------+-----------+----------+--------------+     Summary: BILATERAL: - No evidence of deep vein thrombosis seen in the lower extremities, bilaterally. -No evidence of popliteal cyst, bilaterally.   *See table(s) above for measurements and observations. Electronically signed by Deitra Mayo MD on 05/25/2021 at 2:52:23 PM.    Final    IR ANGIO INTRA EXTRACRAN SEL COM CAROTID INNOMINATE UNI L MOD SED  Result Date: 06/01/2021 INDICATION: 70 year old female with past medical history significant for hypertension presenting with left-sided weakness and fever. 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 Given the possibility of CNS vasculitis, a diagnostic  cerebral angiogram was requested. EXAM: ULTRASOUND-GUIDED VASCULAR ACCESS DIAGNOSTIC CEREBRAL ANGIOGRAM COMPARISON:  CT/CT angiogram of the head and neck May 26, 2021 MEDICATIONS: 5,000 IU heparin, 5 mg Verapamil and 200 mcg nitroglycerin  ANESTHESIA/SEDATION: Versed 1 mg IV; Fentanyl 25 mcg IV Moderate Sedation Time:  46 minutes The patient was continuously monitored during the procedure by the interventional radiology nurse under my direct supervision. CONTRAST:  78 mL of Omnipaque 300 milligrams/mL FLUOROSCOPY TIME:  Fluoroscopy Time: 11 minutes 6 seconds (331 mGy). COMPLICATIONS: None immediate. TECHNIQUE: Informed written consent was obtained from the patient after a thorough discussion of the procedural risks, benefits and alternatives. All questions were addressed. Maximal Sterile Barrier Technique was utilized including caps, mask, sterile gowns, sterile gloves, sterile drape, hand hygiene and skin antiseptic. A timeout was performed prior to the initiation of the procedure. Using the modified Seldinger technique and a micropuncture kit, access was gained to the distal right radial artery at the anatomical snuffbox and a 5 French sheath was placed. Slow intra arterial infusion of 5,000 IU heparin, 5 mg Verapamil and 200 mcg nitroglycerin diluted in patient's own blood was performed. No significant fluctuation in patient's blood pressure seen. Then, a right radial artery roadmap was obtained via sheath side port. Normal brachial artery branching pattern seen. No significant anatomical variation. The right radial artery caliber is adequate for vascular access. Next, a 5 [place] Simmons 2 glide catheter was navigated over a 0.035" Terumo Glidewire into the right subclavian artery under fluoroscopic guidance. Under fluoroscopy the catheter was placed into the right vertebral artery. Frontal, lateral and magnified bilateral oblique views of the head were obtained. The catheter was then placed into the right common carotid artery. Frontal and lateral views of the neck were obtained. Under biplane roadmap, the catheter was placed into the right internal carotid artery. Frontal, lateral and magnified oblique views of the head were obtained. The  catheter was subsequently placed into the right external carotid artery. Frontal and lateral angiograms of the head were obtained. Next, the catheter was placed into the left common carotid artery. Frontal and lateral views of the neck were obtained followed by frontal, lateral and magnified oblique views of the head. The catheter was subsequently withdrawn. An inflatable band was placed and inflated over the right hand access site. The vascular sheath was withdrawn and the band was slowly deflated until brisk flow was noted through the arteriotomy site. At this point, the band was reinflated with additional 2 cc of air to obtain patent hemostasis. FINDINGS: Right radial artery ultrasound and right radial artery angiogram: The caliber of the distal right radial artery is appropriate for angiogram access. The right radial artery and the right ulnar artery have normal course and caliber. No significant anatomical variants noted. Right vertebral artery angiograms: The right vertebral artery, basilar artery, and bilateral posterior cerebral arteries are unremarkable. The left vertebral artery seen by contrast reflux and also appear unremarkable. Luminal caliber is smooth and tapering. No aneurysms or abnormally high-flow, early draining veins are seen. No regions of abnormal hypervascularity are noted. The visualized dural sinuses are patent. Right CCA angiograms: Cervical angiograms show normal course and caliber of the visualized right common carotid and internal carotid arteries. There are no significant stenoses. Right ICA angiograms: There is brisk vascular contrast filling of the ACA and MCA vascular trees. Luminal caliber is smooth and tapering. No aneurysms or abnormally high-flow, early draining veins are seen. No regions of abnormal hypervascularity are noted. The visualized dural sinuses  are patent. Right ECA and right occipital angiograms: No early venous drainage was noted. The visualized branches of the  right external carotid artery are unremarkable. Left CCA angiograms: Cervical angiograms show normal caliber of the visualized left common carotid and internal carotid arteries. Increased tortuosity of the mid cervical segment of the left ICA with mild luminal irregularity. There are no significant stenoses. Left CCA angiograms - cranial views: There is brisk vascular contrast filling of the ACA and MCA vascular trees. Luminal caliber is smooth and tapering. No aneurysms or abnormally high-flow, early draining veins are seen. No regions of abnormal hypervascularity are noted. The visualized dural sinuses are patent. The visualized branches of the left external carotid artery are unremarkable. PROCEDURE: No intervention performed. IMPRESSION: 1. No angiographic evidence of vasculitis. 2. No significant intracranial stenosis, vasospasm, vascular occlusion, aneurysm, AVM, dural AV fistula or other significant vascular abnormality. PLAN: Results communicated to the neurology team. Electronically Signed   By: Pedro Earls M.D.   On: 06/01/2021 17:01   IR ANGIO INTRA EXTRACRAN SEL INTERNAL CAROTID UNI R MOD SED  Result Date: 06/01/2021 INDICATION: 70 year old female with past medical history significant for hypertension presenting with left-sided weakness and fever. 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 Given the possibility of CNS vasculitis, a diagnostic cerebral angiogram was requested. EXAM: ULTRASOUND-GUIDED VASCULAR ACCESS DIAGNOSTIC CEREBRAL ANGIOGRAM COMPARISON:  CT/CT angiogram of the head and neck May 26, 2021 MEDICATIONS: 5,000 IU heparin, 5 mg Verapamil and 200 mcg nitroglycerin ANESTHESIA/SEDATION: Versed 1 mg IV; Fentanyl 25 mcg IV Moderate Sedation Time:  46 minutes The patient was continuously monitored during the procedure by the interventional radiology nurse under my direct supervision. CONTRAST:   78 mL of Omnipaque 300 milligrams/mL FLUOROSCOPY TIME:  Fluoroscopy Time: 11 minutes 6 seconds (331 mGy). COMPLICATIONS: None immediate. TECHNIQUE: Informed written consent was obtained from the patient after a thorough discussion of the procedural risks, benefits and alternatives. All questions were addressed. Maximal Sterile Barrier Technique was utilized including caps, mask, sterile gowns, sterile gloves, sterile drape, hand hygiene and skin antiseptic. A timeout was performed prior to the initiation of the procedure. Using the modified Seldinger technique and a micropuncture kit, access was gained to the distal right radial artery at the anatomical snuffbox and a 5 French sheath was placed. Slow intra arterial infusion of 5,000 IU heparin, 5 mg Verapamil and 200 mcg nitroglycerin diluted in patient's own blood was performed. No significant fluctuation in patient's blood pressure seen. Then, a right radial artery roadmap was obtained via sheath side port. Normal brachial artery branching pattern seen. No significant anatomical variation. The right radial artery caliber is adequate for vascular access. Next, a 5 [place] Simmons 2 glide catheter was navigated over a 0.035" Terumo Glidewire into the right subclavian artery under fluoroscopic guidance. Under fluoroscopy the catheter was placed into the right vertebral artery. Frontal, lateral and magnified bilateral oblique views of the head were obtained. The catheter was then placed into the right common carotid artery. Frontal and lateral views of the neck were obtained. Under biplane roadmap, the catheter was placed into the right internal carotid artery. Frontal, lateral and magnified oblique views of the head were obtained. The catheter was subsequently placed into the right external carotid artery. Frontal and lateral angiograms of the head were obtained. Next, the catheter was placed into the left common carotid artery. Frontal and lateral views of the neck  were obtained followed by frontal, lateral and magnified oblique views of the head. The catheter was subsequently withdrawn. An inflatable band was placed and inflated over the right hand access site. The vascular sheath was withdrawn and the band was slowly deflated until brisk flow was noted through the arteriotomy site. At this point, the band was reinflated with additional 2 cc of air to obtain patent hemostasis. FINDINGS: Right radial artery ultrasound and right radial artery angiogram: The caliber of the distal right radial artery is appropriate for angiogram access. The right radial artery and the right ulnar artery have normal course and caliber. No significant anatomical variants noted. Right vertebral artery angiograms: The right vertebral artery, basilar artery, and bilateral posterior cerebral arteries are unremarkable. The left vertebral artery seen by contrast reflux and also appear unremarkable. Luminal caliber is smooth and tapering. No aneurysms or abnormally high-flow, early draining veins are seen. No regions of abnormal hypervascularity are noted. The visualized dural sinuses are patent. Right CCA angiograms: Cervical angiograms show normal course and caliber of the visualized right common carotid and internal carotid arteries. There are no significant stenoses. Right ICA angiograms: There is brisk vascular contrast filling of the ACA and MCA vascular trees. Luminal caliber is smooth and tapering. No aneurysms or abnormally high-flow, early draining veins are seen. No regions of abnormal hypervascularity are noted. The visualized dural sinuses are patent. Right ECA and right occipital angiograms: No early venous drainage was noted. The visualized branches of the right external carotid artery are unremarkable. Left CCA angiograms: Cervical angiograms show normal caliber of the visualized left common carotid and internal carotid arteries. Increased tortuosity of the mid cervical segment of the left  ICA with mild luminal irregularity. There are no significant stenoses. Left CCA angiograms - cranial views: There is brisk vascular contrast filling of the ACA and MCA vascular trees. Luminal caliber is smooth and tapering. No aneurysms or abnormally high-flow, early draining veins are seen. No regions of abnormal hypervascularity are noted. The visualized dural sinuses are patent. The visualized branches of the left external carotid artery are unremarkable. PROCEDURE: No intervention performed. IMPRESSION: 1. No angiographic evidence of vasculitis. 2. No significant intracranial stenosis, vasospasm, vascular occlusion, aneurysm, AVM, dural AV fistula or other significant vascular abnormality. PLAN: Results communicated to the neurology team. Electronically Signed   By: Pedro Earls M.D.   On: 06/01/2021 17:01   IR ANGIO VERTEBRAL SEL VERTEBRAL UNI R MOD SED  Result Date: 06/01/2021 INDICATION: 70 year old female with past medical history significant for hypertension presenting with left-sided weakness and fever. 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 Given the possibility of CNS vasculitis, a diagnostic cerebral angiogram was requested. EXAM: ULTRASOUND-GUIDED VASCULAR ACCESS DIAGNOSTIC CEREBRAL ANGIOGRAM COMPARISON:  CT/CT angiogram of the head and neck May 26, 2021 MEDICATIONS: 5,000 IU heparin, 5 mg Verapamil and 200 mcg nitroglycerin ANESTHESIA/SEDATION: Versed 1 mg IV; Fentanyl 25 mcg IV Moderate Sedation Time:  46 minutes The patient was continuously monitored during the procedure by the interventional radiology nurse under my direct supervision. CONTRAST:  78 mL of Omnipaque 300 milligrams/mL FLUOROSCOPY TIME:  Fluoroscopy Time: 11 minutes 6 seconds (331 mGy). COMPLICATIONS: None immediate. TECHNIQUE: Informed written consent was obtained from the patient after a thorough discussion of the procedural  risks, benefits and alternatives. All questions were addressed. Maximal Sterile Barrier Technique was utilized including caps, mask, sterile gowns, sterile gloves, sterile drape, hand hygiene  and skin antiseptic. A timeout was performed prior to the initiation of the procedure. Using the modified Seldinger technique and a micropuncture kit, access was gained to the distal right radial artery at the anatomical snuffbox and a 5 French sheath was placed. Slow intra arterial infusion of 5,000 IU heparin, 5 mg Verapamil and 200 mcg nitroglycerin diluted in patient's own blood was performed. No significant fluctuation in patient's blood pressure seen. Then, a right radial artery roadmap was obtained via sheath side port. Normal brachial artery branching pattern seen. No significant anatomical variation. The right radial artery caliber is adequate for vascular access. Next, a 5 [place] Simmons 2 glide catheter was navigated over a 0.035" Terumo Glidewire into the right subclavian artery under fluoroscopic guidance. Under fluoroscopy the catheter was placed into the right vertebral artery. Frontal, lateral and magnified bilateral oblique views of the head were obtained. The catheter was then placed into the right common carotid artery. Frontal and lateral views of the neck were obtained. Under biplane roadmap, the catheter was placed into the right internal carotid artery. Frontal, lateral and magnified oblique views of the head were obtained. The catheter was subsequently placed into the right external carotid artery. Frontal and lateral angiograms of the head were obtained. Next, the catheter was placed into the left common carotid artery. Frontal and lateral views of the neck were obtained followed by frontal, lateral and magnified oblique views of the head. The catheter was subsequently withdrawn. An inflatable band was placed and inflated over the right hand access site. The vascular sheath was withdrawn and the band  was slowly deflated until brisk flow was noted through the arteriotomy site. At this point, the band was reinflated with additional 2 cc of air to obtain patent hemostasis. FINDINGS: Right radial artery ultrasound and right radial artery angiogram: The caliber of the distal right radial artery is appropriate for angiogram access. The right radial artery and the right ulnar artery have normal course and caliber. No significant anatomical variants noted. Right vertebral artery angiograms: The right vertebral artery, basilar artery, and bilateral posterior cerebral arteries are unremarkable. The left vertebral artery seen by contrast reflux and also appear unremarkable. Luminal caliber is smooth and tapering. No aneurysms or abnormally high-flow, early draining veins are seen. No regions of abnormal hypervascularity are noted. The visualized dural sinuses are patent. Right CCA angiograms: Cervical angiograms show normal course and caliber of the visualized right common carotid and internal carotid arteries. There are no significant stenoses. Right ICA angiograms: There is brisk vascular contrast filling of the ACA and MCA vascular trees. Luminal caliber is smooth and tapering. No aneurysms or abnormally high-flow, early draining veins are seen. No regions of abnormal hypervascularity are noted. The visualized dural sinuses are patent. Right ECA and right occipital angiograms: No early venous drainage was noted. The visualized branches of the right external carotid artery are unremarkable. Left CCA angiograms: Cervical angiograms show normal caliber of the visualized left common carotid and internal carotid arteries. Increased tortuosity of the mid cervical segment of the left ICA with mild luminal irregularity. There are no significant stenoses. Left CCA angiograms - cranial views: There is brisk vascular contrast filling of the ACA and MCA vascular trees. Luminal caliber is smooth and tapering. No aneurysms or  abnormally high-flow, early draining veins are seen. No regions of abnormal hypervascularity are noted. The visualized dural sinuses are patent. The visualized branches of the left external carotid artery are unremarkable. PROCEDURE: No intervention performed. IMPRESSION: 1. No  angiographic evidence of vasculitis. 2. No significant intracranial stenosis, vasospasm, vascular occlusion, aneurysm, AVM, dural AV fistula or other significant vascular abnormality. PLAN: Results communicated to the neurology team. Electronically Signed   By: Pedro Earls M.D.   On: 06/01/2021 17:01   IR ANGIO EXTERNAL CAROTID SEL EXT CAROTID UNI R MOD SED  Result Date: 06/01/2021 INDICATION: 70 year old female with past medical history significant for hypertension presenting with left-sided weakness and fever. 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 Given the possibility of CNS vasculitis, a diagnostic cerebral angiogram was requested. EXAM: ULTRASOUND-GUIDED VASCULAR ACCESS DIAGNOSTIC CEREBRAL ANGIOGRAM COMPARISON:  CT/CT angiogram of the head and neck May 26, 2021 MEDICATIONS: 5,000 IU heparin, 5 mg Verapamil and 200 mcg nitroglycerin ANESTHESIA/SEDATION: Versed 1 mg IV; Fentanyl 25 mcg IV Moderate Sedation Time:  46 minutes The patient was continuously monitored during the procedure by the interventional radiology nurse under my direct supervision. CONTRAST:  78 mL of Omnipaque 300 milligrams/mL FLUOROSCOPY TIME:  Fluoroscopy Time: 11 minutes 6 seconds (331 mGy). COMPLICATIONS: None immediate. TECHNIQUE: Informed written consent was obtained from the patient after a thorough discussion of the procedural risks, benefits and alternatives. All questions were addressed. Maximal Sterile Barrier Technique was utilized including caps, mask, sterile gowns, sterile gloves, sterile drape, hand hygiene and skin antiseptic. A timeout was  performed prior to the initiation of the procedure. Using the modified Seldinger technique and a micropuncture kit, access was gained to the distal right radial artery at the anatomical snuffbox and a 5 French sheath was placed. Slow intra arterial infusion of 5,000 IU heparin, 5 mg Verapamil and 200 mcg nitroglycerin diluted in patient's own blood was performed. No significant fluctuation in patient's blood pressure seen. Then, a right radial artery roadmap was obtained via sheath side port. Normal brachial artery branching pattern seen. No significant anatomical variation. The right radial artery caliber is adequate for vascular access. Next, a 5 [place] Simmons 2 glide catheter was navigated over a 0.035" Terumo Glidewire into the right subclavian artery under fluoroscopic guidance. Under fluoroscopy the catheter was placed into the right vertebral artery. Frontal, lateral and magnified bilateral oblique views of the head were obtained. The catheter was then placed into the right common carotid artery. Frontal and lateral views of the neck were obtained. Under biplane roadmap, the catheter was placed into the right internal carotid artery. Frontal, lateral and magnified oblique views of the head were obtained. The catheter was subsequently placed into the right external carotid artery. Frontal and lateral angiograms of the head were obtained. Next, the catheter was placed into the left common carotid artery. Frontal and lateral views of the neck were obtained followed by frontal, lateral and magnified oblique views of the head. The catheter was subsequently withdrawn. An inflatable band was placed and inflated over the right hand access site. The vascular sheath was withdrawn and the band was slowly deflated until brisk flow was noted through the arteriotomy site. At this point, the band was reinflated with additional 2 cc of air to obtain patent hemostasis. FINDINGS: Right radial artery ultrasound and right  radial artery angiogram: The caliber of the distal right radial artery is appropriate for angiogram access. The right radial artery and the right ulnar artery have normal course and caliber. No significant anatomical variants noted. Right vertebral artery angiograms: The right vertebral artery, basilar artery, and bilateral posterior cerebral arteries are unremarkable. The left vertebral  artery seen by contrast reflux and also appear unremarkable. Luminal caliber is smooth and tapering. No aneurysms or abnormally high-flow, early draining veins are seen. No regions of abnormal hypervascularity are noted. The visualized dural sinuses are patent. Right CCA angiograms: Cervical angiograms show normal course and caliber of the visualized right common carotid and internal carotid arteries. There are no significant stenoses. Right ICA angiograms: There is brisk vascular contrast filling of the ACA and MCA vascular trees. Luminal caliber is smooth and tapering. No aneurysms or abnormally high-flow, early draining veins are seen. No regions of abnormal hypervascularity are noted. The visualized dural sinuses are patent. Right ECA and right occipital angiograms: No early venous drainage was noted. The visualized branches of the right external carotid artery are unremarkable. Left CCA angiograms: Cervical angiograms show normal caliber of the visualized left common carotid and internal carotid arteries. Increased tortuosity of the mid cervical segment of the left ICA with mild luminal irregularity. There are no significant stenoses. Left CCA angiograms - cranial views: There is brisk vascular contrast filling of the ACA and MCA vascular trees. Luminal caliber is smooth and tapering. No aneurysms or abnormally high-flow, early draining veins are seen. No regions of abnormal hypervascularity are noted. The visualized dural sinuses are patent. The visualized branches of the left external carotid artery are unremarkable.  PROCEDURE: No intervention performed. IMPRESSION: 1. No angiographic evidence of vasculitis. 2. No significant intracranial stenosis, vasospasm, vascular occlusion, aneurysm, AVM, dural AV fistula or other significant vascular abnormality. PLAN: Results communicated to the neurology team. Electronically Signed   By: Pedro Earls M.D.   On: 06/01/2021 17:01    12-lead ECG on admission shows sinus tachycardia at 138 bpm (personally reviewed) All prior EKG's in EPIC reviewed with no documented atrial fibrillation  Telemetry NSR - Sinus tach 90-120s at times (personally reviewed)  Assessment and Plan:  1. Cryptogenic stroke The patient presents with cryptogenic stroke.  The patient underwent a TEE earlier this admission. I spoke at length with the patient about monitoring for afib with an implantable loop recorder.  Risks, benefits, and alteratives to implantable loop recorder were discussed with the patient today.   At this time, the patient is very clear in their decision to proceed with implantable loop recorder.   Wound care was reviewed with the patient (keep incision clean and dry for 3 days).  Wound check scheduled and entered in AVS. Please call with questions.   Taje Tondreau review Sinus tach episodes with Dr. Curt Bears.   Shirley Friar, PA-C 06/08/2021 11:48 AM  I have seen and examined this patient with Oda Kilts.  Agree with above, note added to reflect my findings.  On exam, RRR, no murmurs.  Patient presented to the hospital with cryptogenic stroke. To date, no cause has been found. Elloise Roark plan for LINQ monitor to look for atrial fibrillation. Risks and benefits discussed. Risks include but not limited to bleeding and infection. The patient understands the risks and has agreed to the procedure.  Burnice Vassel M. Courney Garrod MD 06/08/2021 12:34 PM

## 2021-06-08 NOTE — Progress Notes (Addendum)
PMR Admission Coordinator Pre-Admission Assessment   Patient: Michaela Rios is an 70 y.o., female MRN: [id number] DOB: [birth year] Height: $RemoveBeforeDE'5\' 3"'RPeIKGlqVOcLlhq$  (160 cm) Weight: 60.8 kg   Insurance Information HMO:     PPO: yes     PCP:      IPA:      80/20:      OTHER: PRIMARY: [employer] Medicare      Policy#: [id number]      Subscriber: pt CM Name: Ubaldo Glassing      Phone#: [phone]     Fax#: [phone] Pre-Cert#: [id number] [place] for CIR given by Lattie Haw  with [employer] on 06/07/21, for 5 days Medicare with updates due to [place]  at faxed listed above on 06/11/21      Employer: Benefits:  Phone #: [phone]     Name: Eff. Date: 11/28/20     Deduct: $0      Out of Pocket Max: $4500 ($0 met)      Life Max: n/a CIR: $295/day for days 1-6      SNF: 20 full days Outpatient:      Co-Pay: $35/day Home Health: 100%      Co-Pay: DME: 80%     Co-Pay: 20% Providers:  SECONDARY:       Policy#:      Phone#:   Development worker, community:       Phone#:   The Therapist, art Information Summary" for patients in Inpatient Rehabilitation Facilities with attached "Privacy Act [place] Records" was provided and verbally reviewed with: Patient and Family   Emergency Contact Information Contact Information       Name Relation Home Work Mobile    Bieler,Kerry Spouse [phone]               Current Medical History  Patient Admitting Diagnosis: R BG/CR CVA; L caudate/thal CVA   History of Present Illness: Pt is a 70 y/o female with PMH of HTN, prediabetes, depression, CKD II, and PUD, migraines who presented to [place] on 6/27 with a fall and headache.  Workup in the ED revealed temp of 101.4, elevated d-dimer, tachycardia, and tachypnea.  Mildly elevated lactic acid.  C xray and UA was negative.  Covid negative.  She was admitted for SIRS workup.  On 6/29 husband noted increased confused and PT noted significant change in functional mobility.  CT showed new multifocal hypoattenuation on the basal galglia and  thalami bilaterally.  MRI was completed and showed R basal ganglia infarct as well as L caudate and thalamic infarct.  Neurology consulted and recommended LP and EEG. Which were non-concerning.  Therapy evaluations were completed and pt was recommended for CIR.    Complete NIHSS TOTAL: 3   Patient's medical record from [facility] has been reviewed by the rehabilitation admission coordinator and physician.   Past Medical History      Past Medical History:  Diagnosis Date   Anemia     Anxiety     Arthritis     Cataract     Chronic migraine      follows with neuro for same   Clotting disorder (HCC)      clot in ovarian vein- hx   Esophagitis     GERD (gastroesophageal reflux disease)     Hypertension     IBS (irritable bowel syndrome)     Low back pain syndrome     MRSA (methicillin resistant Staphylococcus aureus) infection 04/17/12    left torso   Peptic ulcer disease 2003, 12/2012  Peripheral vascular disease ([place])     Vitamin D deficiency        Family History   family history includes Aneurysm in her father; Lung cancer in her brother; Prostate cancer in her brother.   Prior Rehab/Hospitalizations Has the patient had prior rehab or hospitalizations prior to admission? No   Has the patient had major surgery during 100 days prior to admission? No               Current Medications   Current Facility-Administered Medications:   acetaminophen (TYLENOL) tablet 650 mg, 650 mg, Oral, Q6H PRN, 650 mg at 05/25/21 0438 **OR** acetaminophen (TYLENOL) suppository 650 mg, 650 mg, Rectal, Q6H PRN, Orma Flaming, MD   amLODipine (NORVASC) tablet 5 mg, 5 mg, Oral, q AM, Orma Flaming, MD, 5 mg at 05/28/21 8177   aspirin suppository 300 mg, 300 mg, Rectal, Daily **OR** aspirin tablet 325 mg, 325 mg, Oral, Daily, Regalado, Belkys A, MD, 325 mg at 05/28/21 0943   aztreonam (AZACTAM) 2 g in sodium chloride 0.9 % 100 mL IVPB, 2 g, Intravenous, Q8H, Regalado, Belkys A, MD, Last Rate: 200  mL/hr at 05/28/21 1400, 2 g at 05/28/21 1400   buPROPion (WELLBUTRIN XL) 24 hr tablet 300 mg, 300 mg, Oral, Daily, Orma Flaming, MD, 300 mg at 05/28/21 0944   feeding supplement (ENSURE ENLIVE / ENSURE PLUS) liquid 237 mL, 237 mL, Oral, BID BM, Orma Flaming, MD, 237 mL at 05/28/21 1100   hydrALAZINE (APRESOLINE) tablet 25 mg, 25 mg, Oral, BID, Regalado, Belkys A, MD   HYDROcodone-acetaminophen (NORCO) 7.5-325 MG per tablet 1 tablet, 1 tablet, Oral, Q6H PRN, Regalado, Belkys A, MD, 1 tablet at 05/27/21 0133   lactated ringers infusion, , Intravenous, Continuous, Regalado, Belkys A, MD, Last Rate: 100 mL/hr at 05/28/21 1000, New Bag at 05/28/21 1000   LORazepam (ATIVAN) tablet 1 mg, 1 mg, Oral, BID PRN, Orma Flaming, MD, 1 mg at 05/27/21 2249   metoprolol succinate (TOPROL-XL) 24 hr tablet 100 mg, 100 mg, Oral, Daily, Orma Flaming, MD, 100 mg at 05/28/21 0944   multivitamin with minerals tablet 1 tablet, 1 tablet, Oral, Daily, Regalado, Belkys A, MD, 1 tablet at 05/28/21 0944   ondansetron (ZOFRAN) tablet 4 mg, 4 mg, Oral, Q6H PRN, Orma Flaming, MD   potassium chloride SA (KLOR-CON) CR tablet 40 mEq, 40 mEq, Oral, BID, Regalado, Belkys A, MD, 40 mEq at 05/28/21 0944   tiZANidine (ZANAFLEX) tablet 4 mg, 4 mg, Oral, BID PRN, Orma Flaming, MD   topiramate (TOPAMAX) tablet 150 mg, 150 mg, Oral, QHS, Orma Flaming, MD, 150 mg at 05/27/21 2244   venlafaxine XR (EFFEXOR-XR) 24 hr capsule 75 mg, 75 mg, Oral, Q breakfast, Orma Flaming, MD, 75 mg at 05/28/21 0944   vitamin B-12 (CYANOCOBALAMIN) tablet 1,000 mcg, 1,000 mcg, Oral, Daily, Regalado, Belkys A, MD, 1,000 mcg at 05/28/21 0943   zolpidem (AMBIEN) tablet 5 mg, 5 mg, Oral, QHS PRN, Orma Flaming, MD, 5 mg at 05/27/21 2244   Patients Current Diet:  Diet Order                  Diet Heart Room service appropriate? Yes; Fluid consistency: Thin  Diet effective now                         Precautions /  Restrictions Precautions Precautions: Fall, Other (comment) Precaution Comments: urinary urgency/incontinence (has Depends in room) Restrictions Weight Bearing Restrictions: No  Has the patient had 2 or more falls or a fall with injury in the past year? No   Prior Activity Level Community (5-7x/wk): retired, no DME prior to admit, still driving   Prior Functional Level Self Care: Did the patient need help bathing, dressing, using the toilet or eating? Independent   Indoor Mobility: Did the patient need assistance with walking from room to room (with or without device)? Independent   Stairs: Did the patient need assistance with internal or external stairs (with or without device)? Independent   Functional Cognition: Did the patient need help planning regular tasks such as shopping or remembering to take medications? Independent   Home Assistive Devices / Equipment Home Assistive Devices/Equipment: None Home Equipment: Shower seat, Grab bars - tub/shower   Prior Device Use: Indicate devices/aids used by the patient prior to current illness, exacerbation or injury? None of the above   Current Functional Level Cognition   Arousal/Alertness: Awake/alert Overall Cognitive Status: Impaired/Different from baseline Current Attention Level: Sustained Orientation Level: Oriented X4 Following Commands: Follows one step commands inconsistently, Follows one step commands with increased time Safety/Judgement: Decreased awareness of safety, Decreased awareness of deficits General Comments: Pt demonstrating decreased L sided awareness/mild attentional neglect, as well as motor planning deficits and slow processing. Attention: Focused, Sustained Focused Attention: Impaired Sustained Attention: Impaired Memory: Impaired Memory Impairment: Decreased recall of new information, Decreased short term memory Awareness: Impaired Problem Solving: Impaired Executive Function: Organizing, Self  Monitoring Organizing: Impaired Self Monitoring: Impaired Safety/Judgment: Impaired    Extremity Assessment (includes Sensation/Coordination)   Upper Extremity Assessment: Overall WFL for tasks assessed  Lower Extremity Assessment: Defer to PT evaluation     ADLs   Overall ADL's : Needs assistance/impaired Eating/Feeding: Set up, Sitting Grooming: Set up, Sitting Upper Body Bathing: Minimal assistance, Sitting Upper Body Bathing Details (indicate cue type and reason): reminders to sit up straight/maintain midline due to posterior and L sided leaning. Lower Body Bathing: Moderate assistance, Maximal assistance, Sitting/lateral leans, Sit to/from stand Lower Body Bathing Details (indicate cue type and reason): Mod/Max A due to balance concerns as well as L sided neglect/inattention requiring verbal and tactile cues Upper Body Dressing : Min guard, Sitting Lower Body Dressing: Moderate assistance, Maximal assistance, Sitting/lateral leans, Sit to/from stand Lower Body Dressing Details (indicate cue type and reason): Mod/Max A due to L sided leaning and incoordination with fine motor skills, particularly in the L hand. Toilet Transfer: Moderate assistance, +2 for physical assistance, +2 for safety/equipment, Stand-pivot Toilet Transfer Details (indicate cue type and reason): Pt leans heavly to the L side and posteriorly upon standing, requires mod A to steady and +2 totransfers. Toileting- Clothing Manipulation and Hygiene: Moderate assistance, +2 for physical assistance, +2 for safety/equipment, Sitting/lateral lean, Sit to/from stand Tub/ Shower Transfer: Moderate assistance, +2 for physical assistance, +2 for safety/equipment, Stand-pivot Functional mobility during ADLs: Moderate assistance, +2 for physical assistance, +2 for safety/equipment General ADL Comments: Pt presenting with increased incoordination in L hand in particular for all dressing and grooming tasks. Additionally, pt  requiring increased assistance for all mobility due to L sided inattention/neglect, where pt leans posteriorly and to the L.     Mobility   Overal bed mobility: Needs Assistance Bed Mobility: Supine to Sit Supine to sit: Mod assist, HOB elevated Sit to supine: Min assist General bed mobility comments: Mod A to elevate trunk and square hips, Pt required additional time to process/plan how to come to sitting EOB.     Transfers  Overall transfer level: Needs assistance Equipment used: 2 person hand held assist Transfers: Sit to/from Stand, Stand Pivot Transfers Sit to Stand: Max assist, +2 physical assistance, +2 safety/equipment Stand pivot transfers: Mod assist, Max assist, +2 physical assistance, +2 safety/equipment General transfer comment: Pt unable to stand without HHA, requiring mod-maxA to elevate trunk and maintain stability, pt with strong posterior lean bracing BLEs against EOB. Additional stand pivot to recliner with bilateral HHA and modA, pt able to take shuffling steps.     Ambulation / Gait / Stairs / Wheelchair Mobility   Ambulation/Gait Ambulation/Gait assistance: Counsellor (Feet): 150 Feet Assistive device: 1 person hand held assist, IV Pole, None Gait Pattern/deviations: Step-through pattern, Decreased stride length, Trunk flexed, Shuffle General Gait Details: Shuffling, pivotal steps from bed to recliner with bilateral HHA and modA for trunk stability; will require +2 assist for gait progression Gait velocity: Decreased     Posture / Balance Dynamic Sitting Balance Sitting balance - Comments: Pt reliant on UE support and fluctuating min guard to modA to maintain static sitting balance; pt with posterior/L lateral lean requiring verbal and tactile cues to correct. Balance Overall balance assessment: Needs assistance Sitting-balance support: Bilateral upper extremity supported, Feet supported Sitting balance-Leahy Scale: Poor Sitting balance - Comments:  Pt reliant on UE support and fluctuating min guard to modA to maintain static sitting balance; pt with posterior/L lateral lean requiring verbal and tactile cues to correct. Postural control: Posterior lean, Left lateral lean Standing balance support: Bilateral upper extremity supported, During functional activity Standing balance-Leahy Scale: Poor Standing balance comment: Reliant on UE support and external assist, difficulty correct heavy posterior lean     Special needs/care consideration Diabetic management yes, prediabetes    Previous Home Environment (from acute therapy documentation) Living Arrangements: Spouse/significant other  Lives With: Spouse Available Help at Discharge: Family, Available 24 hours/day Type of Home: House Home Layout: One level Home Access: Stairs to enter Entrance Stairs-Rails: Right Entrance Stairs-Number of Steps: 2-5 Bathroom Shower/Tub: Multimedia programmer: Handicapped height Bathroom Accessibility: Yes How Accessible: Accessible via walker Home Care Services: No Additional Comments: Husband retired and available for 24/7 assist   Discharge Living Setting Plans for Discharge Living Setting: Patient's home, Lives with (comment) (spouse) Type of Home at Discharge: House Discharge Home Layout: One level Discharge Home Access: Stairs to enter Entrance Stairs-Rails: None Entrance Stairs-Number of Steps: 3 Discharge Bathroom Shower/Tub: Walk-in shower Discharge Bathroom Toilet: Handicapped height Discharge Bathroom Accessibility: Yes How Accessible: Accessible via walker (have a water closet, so accessibility to the actual toilet will be challenging) Does the patient have any problems obtaining your medications?: No   Social/Family/Support Systems Patient Roles: Spouse Anticipated Caregiver: Makalyn Lennox Anticipated Caregiver's Contact Information: [phone] Ability/Limitations of Caregiver: min assist, except toileting Caregiver  Availability: 24/7 Discharge Plan Discussed with Primary Caregiver: Yes Is Caregiver In Agreement with Plan?: Yes Does Caregiver/Family have Issues with Lodging/Transportation while Pt is in Rehab?: No   Goals Patient/Family Goal for Rehab: PT/OT supervision to min assist, SLP supervision Expected length of stay: 21-24 days Pt/Family Agrees to Admission and willing to participate: Yes Program Orientation Provided & Reviewed with Pt/Caregiver Including Roles  & Responsibilities: Yes  Barriers to Discharge: Decreased caregiver support, Insurance for SNF coverage   Decrease burden of Care through IP rehab admission: n/a    Possible need for SNF placement upon discharge: Not anticipated.  Explained that [facility] would not approve SNF directly from CIR.  Only option is to  d/c home with support from spouse. He is agreeable and states he can assist.   Patient Condition: I have reviewed medical records from [facility], spoken with CM, and patient and spouse. I met with patient at the bedside for inpatient rehabilitation assessment.  Patient will benefit from ongoing PT, OT, and SLP, can actively participate in 3 hours of therapy a day 5 days of the week, and can make measurable gains during the admission.  Patient will also benefit from the coordinated team approach during an Inpatient Acute Rehabilitation admission.  The patient will receive intensive therapy as well as Rehabilitation physician, nursing, social worker, and care management interventions.  Due to safety, skin/wound care, disease management, medication administration, pain management, and patient education the patient requires 24 hour a day rehabilitation nursing.  The patient is currently min assist with mobility and basic ADLs.  Discharge setting and therapy post discharge at home with home health is anticipated.  Patient has agreed to participate in the Acute Inpatient Rehabilitation Program and will admit pending insurance  authorization 06/08/21 .   Preadmission Screen Completed By: Shann Medal, PT, DPT with updates by Clemens Catholic , 05/28/2021 2:41 PM ______________________________________________________________________   Discussed status with Dr. Dagoberto Ligas  on 06/08/21 at  930and received approval for admission today.   Admission Coordinator: Michel Santee, PT, time 1030 Sudie Grumbling 06/08/21    Assessment/Plan: Diagnosis: Does the need for close, 24 hr/day Medical supervision in concert with the patient's rehab needs make it unreasonable for this patient to be served in a less intensive setting? Yes Co-Morbidities requiring supervision/potential complications: HTN, prediabetes, migraines, CKD II, PUD, depression; stroke with B/L weakness and cognitive issues. Due to bladder management, bowel management, safety, skin/wound care, disease management, medication administration, and patient education, does the patient require 24 hr/day rehab nursing? Yes Does the patient require coordinated care of a physician, rehab nurse, PT, OT, and SLP to address physical and functional deficits in the context of the above medical diagnosis(es)? Yes Addressing deficits in the following areas: balance, endurance, locomotion, strength, transferring, bowel/bladder control, bathing, dressing, feeding, grooming, toileting, and cognition Can the patient actively participate in an intensive therapy program of at least 3 hrs of therapy 5 days a week? Yes The potential for patient to make measurable gains while on inpatient rehab is good Anticipated functional outcomes upon discharge from inpatient rehab: supervision and min assist PT, supervision and min assist OT, supervision SLP Estimated rehab length of stay to reach the above functional goals is: 21-24 days Anticipated discharge destination: Home 10. Overall Rehab/Functional Prognosis: good     MD Signature:            Revision History

## 2021-06-08 NOTE — Discharge Summary (Signed)
Physician Discharge Summary  Michaela Rios KZS:[id number] DOB: Dec 03, 1950 DOA: 05/24/2021  PCP: Binnie Rail, MD  Admit date: 05/24/2021 Discharge date: 06/08/2021  Admitted From: Home Disposition: CIR  Recommendations for Outpatient Follow-up:  Follow up with PCP in 1-2 weeks Follow-up with neurology 1 month with Dr. Leonie Man with repeat MRI Started on aspirin and Plavix Follow-up [facility] autoimmune and paraneoplastic panel which is pending at time of discharge Repeat CT chest in 12 months for incidental nodule finding Will need loop recorder placement prior to discharge from CIR  Discharge Condition: Stable CODE STATUS: Full code Diet recommendation: Heart healthy diet  History of present illness:  Michaela Rios is a 70 year old female with past medical history significant for essential hypertension, prediabetes, depression, CKD stage III, history of peptic ulcer disease, chronic low back pain, migraine headache who presented to [facility] on 6/27 with generalized weakness, loss of balance.  Patient reports she was going to the bathroom lost her balance and fell.  She has been having some chills.  Patient also reports poor oral intake over the last few days with increased urinary frequency but without dysuria.  No nausea/vomiting/diarrhea, no chest pain, no palpitations, no abdominal pain.   In the ED, BP 164/116, HR 141, RR 17, temperature 101.4 F, SPO2 96% on room air.  Sodium 139, potassium 2.8, chloride 105, CO2 22, glucose 121, BUN 9, creatinine 0.77.  AST 24, ALT 16, total bilirubin 0.5.  WBC 8.6, hemoglobin 11.6, platelets 243.  Lactic acid 2.1.  Urinalysis negative.  COVID-19 PCR negative.  Influenza A/B PCR negative.  D-dimer elevated 2.51.  Chest x-ray with no acute cardiopulmonary disease process.  CT renal stone study with no acute obstructing urinary tract or ureteral calculus, no uropathy or hydronephrosis, no acute intra-abdominal or pelvic finding.  CT angiogram chest with no  evidence of acute pulmonary embolism, 4 mm right middle lobe peripheral nodular opacity.  Hospital course:  SIRS Fever of unknown origin Left greater than right brain abnormal signal in our MRI Patient presenting to the ED with weakness, fever with temperature 101.4 F, tachycardia, tachypnea and mild elevation of lactic acid with associated confusion.  Chest x-ray negative for acute cardiopulmonary disease process.  Urinalysis negative for infection.  COVID/influenza PCR negative.  Blood cultures and urine culture with no growth.  CT abdomen/pelvis with no acute intra-abdominal/pelvic process.  Underwent lumbar puncture on 6/30.  Patient was initially started on the broad-spectrum antibiotics with gentamicin, vancomycin, Flagyl, aztreonam.  Infectious disease was initially consulted, antibiotics were discontinued with negative infectious work-up.  ESR 37, CRP 6.8.  RPR negative, VR DL nonreactive.  ANA positive, double-stranded DNA negative.  ANCA negative. ACE level within normal limits. Rheumatoid factor negative.  B12 254, B1 level normal, TSH within normal limits.  Acute hepatitis panel negative.  Complement levels within normal limits. Cerebral angiogram 7/5; no angiographic evidence of vasculitis, no intracranial stenosis/vasospasm, vascular occlusion, aneurysm, AVM, dural AV fistula or other significant vascular abnormality.  CT head with new multifocal hypoattenuation basal ganglia and thalamus bilaterally of indeterminate etiology, no mass-effect to suggest tumor.  MR brain with right basal ganglia infarct, hyperdensity and mild enlargement of the left head of the caudate, left thalamus and small amount of hyperintensity in the right medial thalamus with recommendation of postcontrast MRI for further evaluation.  MRI with contrast with mild largely vascular perivascular appearing enhancement in the areas of the signal abnormality in the deep gray nuclei as well as small amount of leptomeningeal  enhancement over the right and possibly left parietal convexities, no solid mass enhancing concerning for cryptococcus, vasculitis, neoplasm, angiocentric lymphoma versus granulomatosis, autoimmune or inflammatory condition.  Repeat follow-up MR brain with and without contrast 7/6 with numerous small acute bilateral cerebral and cerebellar infarcts compatible with emboli, expected evolution of right basal ganglia infarct with decreased T2 signal abnormality and enhancement in the left greater than right deep gray matter and deep white matter tracts.  Completed 3-day course of IV cell Medrol 1 g every 24 hours on 7/5.  CSF cytology negative for malignant cells, CSF oligoclonal bands negative.  Pending [facility] autoimmune/paraneoplastic panel at time of discharge.  Will need close follow-up with neurology 1 month with Dr. Leonie Man with repeat MRI.   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  Initially thought to be infectious, chest x-ray, CT angiogram chest, and urinalysis unrevealing.  Brain with right basal ganglia infarct and hyperintensity and mild enlargement of the left head of the caudate, left thalamus and small amount of hyperintensity right medial thalamus.  Hemoglobin A1c 5.9.  LDL 45.  TTE with LVEF 60 to 65%, no LV regional wall motion normalities, grade 1 diastolic dysfunction, mild MR, no aortic valve stenosis, IVC normal in size.  TEE EF 60 to 65%, no left atrial left atrial appendage thrombus, bubble study positive for shunting suggestive of a large patent foramen ovale.  Neurology believes her embolic appearing stroke secondary to cerebral angiogram procedure complication versus cryptogenic, and due to low rope score, no need for closure of PFO per Dr. Leonie Man and Dr. Erlinda Hong.  Neurology consulted cardiology for loop recorder placement to evaluate for occult atrial  fibrillation.  Continue aspirin and Plavix.  No statin as LDL below goal of 70 (45), outpatient follow-up with neurology 1 month.  Will need loop recorder placement per electrophysiology at time of discharge from CIR.  Discharging to CIR for further rehabilitation.   Right hand hematoma Patient underwent cerebral angiogram via right radial artery catheterization on 7/5 with resultant right dorsal hand hematoma.  Left upper extremity arterial duplex negative for pseudoaneurysm.  Seen by vascular surgery with no focal hematoma to drain as this is diffuse interstitial and intramuscular edema.  Continue supportive care, much improved.  Continue PT/OT efforts with range of motion exercises.   Right middle lobe nodule Incidental finding of a 4 mm right middle lobe peripheral nodular opacity.  Recommend repeat CT chest 12 months.   Elevated D-dimer D-dimer elevated 2.51 admission, bilateral duplex ultrasound lower extremities negative for DVT, CT angiogram chest negative for PE.   Essential hypertension Amlodipine 5 mg p.o. daily, Hydralazine 25 mg p.o. twice daily, Metoprolol succinate 100 mg p.o. daily   Depression: Venlafaxine 75 mg p.o. daily, Wellbutrin 300 mg p.o. daily   Anxiety: Lorazepam 1 mg twice daily as needed   Migraine headaches: Topamax 150 mg p.o. nightly, sumatriptan as needed  Discharge Diagnoses:  Active Problems:   Anxiety   Depression   Essential hypertension   Prediabetes   Hypokalemia   Cerebral thrombosis with cerebral infarction    Discharge Instructions  Discharge Instructions     Ambulatory referral to Neurology   Complete by: As directed    Follow up with Dr. Leonie Man at [facility] in 4-6 weeks. Too complicated for RN to follow. Thanks.   Diet - low sodium heart healthy   Complete by: As directed    Increase  activity slowly   Complete by: As directed    No wound care   Complete by: As directed       Allergies as of 06/08/2021       Reactions    Amitriptyline Hcl    Changed personality   Shellfish Allergy Nausea And Vomiting, Other (See Comments)   Tested "high" on allergy test   Amoxicillin Hives   Cephalexin Hives   Lisinopril Cough   .   Penicillins Hives        Medication List     STOP taking these medications    furosemide 20 MG tablet Commonly known as: LASIX   nitrofurantoin (macrocrystal-monohydrate) 100 MG capsule Commonly known as: Macrobid   omeprazole 20 MG capsule Commonly known as: PRILOSEC       TAKE these medications    acetaminophen 650 MG CR tablet Commonly known as: TYLENOL Take 650-1,300 mg by mouth every 8 (eight) hours as needed for pain.   amLODipine 5 MG tablet Commonly known as: NORVASC TAKE ONE TABLET BY MOUTH EVERY MORNING What changed: when to take this   aspirin 81 MG EC tablet Take 1 tablet (81 mg total) by mouth daily. Swallow whole. Start taking on: June 09, 2021   buPROPion 300 MG 24 hr tablet Commonly known as: WELLBUTRIN XL TAKE ONE TABLET BY MOUTH EVERY MORNING What changed: when to take this   calcium carbonate 1250 (500 Ca) MG tablet Commonly known as: OS-CAL - dosed in mg of elemental calcium Take 1 tablet (500 mg of elemental calcium total) by mouth daily with breakfast. Start taking on: June 09, 2021   clopidogrel 75 MG tablet Commonly known as: PLAVIX Take 1 tablet (75 mg total) by mouth daily. Start taking on: June 09, 2021   cyanocobalamin 1000 MCG tablet Take 1 tablet (1,000 mcg total) by mouth daily. Start taking on: June 09, 2021   EPINEPHrine 0.3 mg/0.3 mL Soaj injection Commonly known as: EpiPen 2-Pak Inject 0.3 mLs (0.3 mg total) into the muscle as needed for anaphylaxis.   hydrALAZINE 25 MG tablet Commonly known as: APRESOLINE TAKE ONE TABLET BY MOUTH EVERY MORNING and TAKE ONE TABLET BY MOUTH EVERYDAY AT BEDTIME What changed: See the new instructions.   HYDROcodone-acetaminophen 7.5-325 MG tablet Commonly known as: NORCO Take 1  tablet by mouth 2 (two) times daily as needed (migraine headache).   LORazepam 1 MG tablet Commonly known as: ATIVAN Take 1 tablet (1 mg total) by mouth 2 (two) times daily as needed.   metoprolol succinate 100 MG 24 hr tablet Commonly known as: TOPROL-XL TAKE ONE TABLET BY MOUTH EVERY MORNING WITH OR immediately following A meal What changed: See the new instructions.   ondansetron 4 MG tablet Commonly known as: ZOFRAN Take 1 tablet (4 mg total) by mouth every 6 (six) hours as needed for nausea.   pantoprazole 40 MG tablet Commonly known as: PROTONIX Take 1 tablet (40 mg total) by mouth daily. Start taking on: June 09, 2021   SUMAtriptan 100 MG tablet Commonly known as: IMITREX Take one tablet (100 mg) by mouth at onset of migraine headache, may repeat in 2 hours if still needed What changed:  how much to take how to take this when to take this   tiZANidine 4 MG tablet Commonly known as: ZANAFLEX TAKE ONE TABLET BY MOUTH twice daily AS NEEDED FOR muscle SPASMS What changed: See the new instructions.   topiramate 100 MG tablet Commonly known as: TOPAMAX Take 1.5 tablets (  150 mg total) by mouth at bedtime.   venlafaxine XR 75 MG 24 hr capsule Commonly known as: EFFEXOR-XR TAKE THREE CAPSULES BY MOUTH EVERY MORNING What changed: See the new instructions.   VITAMIN D3 PO Take 1 tablet by mouth daily.   zolpidem 10 MG tablet Commonly known as: AMBIEN TAKE ONE TABLET BY MOUTH EVERYDAY AT BEDTIME AS NEEDED FOR SLEEP What changed:  how much to take how to take this when to take this additional instructions        Follow-up Information     Garvin Fila, MD. Schedule an appointment as soon as possible for a visit in 1 month(s).   Specialties: Neurology, Radiology Why: stroke clinic Contact information: [address] [place] [address] [phone]         Binnie Rail, MD. Schedule an appointment as soon as possible for a visit in 1  week(s).   Specialty: Internal Medicine Contact information: [place] [address] [phone]                Allergies  Allergen Reactions   Amitriptyline Hcl     Changed personality   Shellfish Allergy Nausea And Vomiting and Other (See Comments)    Tested "high" on allergy test   Amoxicillin Hives   Cephalexin Hives   Lisinopril Cough    .   Penicillins Hives    Consultations: Neurology Infectious disease Interventional radiology Cardiology Vascular surgery   Procedures/Studies: CT ANGIO HEAD W OR WO CONTRAST  Result Date: 05/26/2021 CLINICAL DATA:  Stroke suspected. EXAM: CT ANGIOGRAPHY HEAD AND NECK TECHNIQUE: Multidetector CT imaging of the head and neck was performed using the standard protocol during bolus administration of intravenous contrast. Multiplanar CT image reconstructions and MIPs were obtained to evaluate the vascular anatomy. Carotid stenosis measurements (when applicable) are obtained utilizing NASCET criteria, using the distal internal carotid diameter as the denominator. CONTRAST:  60m OMNIPAQUE IOHEXOL 350 MG/ML SOLN COMPARISON:  None. FINDINGS: CTA NECK FINDINGS Aortic arch: Standard 3 vessel aortic arch with mild atherosclerotic plaque. Widely patent arch vessel origins. Right carotid system: Patent without evidence of stenosis, dissection, or significant atherosclerosis. Left carotid system: Patent with minimal calcified plaque at the carotid bifurcation. No evidence of dissection or stenosis. Tortuous mid cervical ICA. Vertebral arteries: Patent with the right being slightly larger than the left. No evidence of dissection or stenosis. Skeleton: Advanced disc degeneration at C5-6. Advanced facet arthrosis at C4-5 with grade 1 anterolisthesis. Other neck: No evidence of cervical lymphadenopathy or mass. Upper chest: No apical lung consolidation or mass. Review of the MIP images confirms the above findings CTA HEAD FINDINGS Anterior  circulation: The internal carotid arteries are patent from skull base to carotid termini with minimal nonstenotic plaque. ACAs and MCAs are patent without evidence of a proximal branch occlusion or significant proximal stenosis. No aneurysm is identified. Posterior circulation: The intracranial vertebral arteries are widely patent to the basilar. Patent bilateral PICA, left AICA, and bilateral SCA origins are visualized. A right AICA is not clearly identified. The basilar artery is widely patent. There are small posterior communicating arteries bilaterally. Both PCAs are patent without evidence of a significant proximal stenosis. No aneurysm is identified. Venous sinuses: As permitted by contrast timing, patent. Anatomic variants: None. Review of the MIP images confirms the above findings IMPRESSION: 1. Minimal atherosclerosis in the head and neck without large vessel occlusion or significant stenosis. 2. Aortic Atherosclerosis (ICD10-I70.0). Electronically Signed   By:  Logan Bores M.D.   On: 05/26/2021 20:00   DG Chest 2 View  Result Date: 05/24/2021 CLINICAL DATA:  Fever and urinary frequency EXAM: CHEST - 2 VIEW COMPARISON:  08/17/2020 FINDINGS: The heart size and mediastinal contours are within normal limits. Both lungs are clear. The visualized skeletal structures are unremarkable. IMPRESSION: No active cardiopulmonary disease. Electronically Signed   By: Franchot Gallo M.D.   On: 05/24/2021 14:41   CT HEAD WO CONTRAST  Result Date: 05/26/2021 CLINICAL DATA:  Headache. EXAM: CT HEAD WITHOUT CONTRAST TECHNIQUE: Contiguous axial images were obtained from the base of the skull through the vertex without intravenous contrast. COMPARISON:  08/17/2020 FINDINGS: Brain: There is new multifocal hypoattenuation in the basal ganglia and thalami bilaterally. A band of low-density involving the right caudate and lentiform nuclei has an appearance suggestive of an acute lateral lenticulostriate territory infarct. No  acute cortically based infarct, intracranial hemorrhage, midline shift, or extra-axial fluid collection is identified. Hypodensities in the periventricular white matter bilaterally are nonspecific but compatible with mild chronic small vessel ischemic disease. Vascular: No hyperdense vessel. Skull: No fracture or suspicious osseous lesion. Sinuses/Orbits: Visualized paranasal sinuses and mastoid air cells are clear. Unremarkable orbits. Other: None. IMPRESSION: New multifocal hypoattenuation in the basal ganglia and thalami bilaterally of indeterminate etiology. Considerations include multifocal infarcts (including venous infarcts), toxic/metabolic insult, hypoxic-ischemic injury, and infection. No mass effect to suggest tumor. Correlate with pending MRI. Electronically Signed   By: Logan Bores M.D.   On: 05/26/2021 15:20   CT ANGIO NECK W OR WO CONTRAST  Result Date: 05/26/2021 CLINICAL DATA:  Stroke suspected. EXAM: CT ANGIOGRAPHY HEAD AND NECK TECHNIQUE: Multidetector CT imaging of the head and neck was performed using the standard protocol during bolus administration of intravenous contrast. Multiplanar CT image reconstructions and MIPs were obtained to evaluate the vascular anatomy. Carotid stenosis measurements (when applicable) are obtained utilizing NASCET criteria, using the distal internal carotid diameter as the denominator. CONTRAST:  96m OMNIPAQUE IOHEXOL 350 MG/ML SOLN COMPARISON:  None. FINDINGS: CTA NECK FINDINGS Aortic arch: Standard 3 vessel aortic arch with mild atherosclerotic plaque. Widely patent arch vessel origins. Right carotid system: Patent without evidence of stenosis, dissection, or significant atherosclerosis. Left carotid system: Patent with minimal calcified plaque at the carotid bifurcation. No evidence of dissection or stenosis. Tortuous mid cervical ICA. Vertebral arteries: Patent with the right being slightly larger than the left. No evidence of dissection or stenosis.  Skeleton: Advanced disc degeneration at C5-6. Advanced facet arthrosis at C4-5 with grade 1 anterolisthesis. Other neck: No evidence of cervical lymphadenopathy or mass. Upper chest: No apical lung consolidation or mass. Review of the MIP images confirms the above findings CTA HEAD FINDINGS Anterior circulation: The internal carotid arteries are patent from skull base to carotid termini with minimal nonstenotic plaque. ACAs and MCAs are patent without evidence of a proximal branch occlusion or significant proximal stenosis. No aneurysm is identified. Posterior circulation: The intracranial vertebral arteries are widely patent to the basilar. Patent bilateral PICA, left AICA, and bilateral SCA origins are visualized. A right AICA is not clearly identified. The basilar artery is widely patent. There are small posterior communicating arteries bilaterally. Both PCAs are patent without evidence of a significant proximal stenosis. No aneurysm is identified. Venous sinuses: As permitted by contrast timing, patent. Anatomic variants: None. Review of the MIP images confirms the above findings IMPRESSION: 1. Minimal atherosclerosis in the head and neck without large vessel occlusion or significant stenosis. 2. Aortic Atherosclerosis (  ICD10-I70.0). Electronically Signed   By: Logan Bores M.D.   On: 05/26/2021 20:00   CT Angio Chest PE W/Cm &/Or Wo Cm  Result Date: 05/24/2021 CLINICAL DATA:  Elevated D-dimer, tachycardia EXAM: CT ANGIOGRAPHY CHEST WITH CONTRAST TECHNIQUE: Multidetector CT imaging of the chest was performed using the standard protocol during bolus administration of intravenous contrast. Multiplanar CT image reconstructions and MIPs were obtained to evaluate the vascular anatomy. CONTRAST:  77m OMNIPAQUE IOHEXOL 350 MG/ML SOLN COMPARISON:  None. FINDINGS: Cardiovascular: Normal heart size. No pericardial effusion. Thoracic aorta is normal in caliber minor calcified plaque. Satisfactory opacification of the  pulmonary arteries to the proximal segmental level. No evidence of acute pulmonary embolism. Mediastinum/Nodes: No enlarged mediastinal or hilar lymph nodes. Mildly prominent but nonenlarged axillary and subpectoral lymph nodes. Lungs/Pleura: No pleural effusion or pneumothorax. There is a 4 mm peripheral nodular opacity of the right middle lobe (series 7, image 58). Upper Abdomen: No acute abnormality. Postoperative changes at the gastroesophageal junction. Musculoskeletal: Degenerative changes of the spine. No acute osseous abnormality. Review of the MIP images confirms the above findings. IMPRESSION: No evidence of acute pulmonary embolism or other acute abnormality. 4 mm right middle lobe peripheral nodular opacity. No follow-up needed if patient is low-risk. Non-contrast chest CT can be considered in 12 months if patient is high-risk. This recommendation follows the consensus statement: Guidelines for Management of Incidental Pulmonary Nodules Detected on CT Images: From the Fleischner Society 2017; Radiology 2017; 284:228-243. Electronically Signed   By: PMacy MisM.D.   On: 05/24/2021 16:41   MR BRAIN WO CONTRAST  Result Date: 05/26/2021 CLINICAL DATA:  Acute neuro deficit.  Abnormal CT.  Fever EXAM: MRI HEAD WITHOUT CONTRAST TECHNIQUE: Multiplanar, multiecho pulse sequences of the brain and surrounding structures were obtained without intravenous contrast. COMPARISON:  CT head 02/29/2022, 08/17/2020 FINDINGS: Brain: Focal area of restricted diffusion in the right lenticulostriate distribution compatible with acute infarct. This area also shows increased signal on T2 and FLAIR. Mild edema in the deep white matter tracts on the right. Small amount of FLAIR hyperintensity right medial thalamus. Additional areas of increased signal on T2 and FLAIR involving the head of caudate on the left and left thalamus without restricted diffusion. There is mild enlargement of the head of the caudate on the left.  There is also asymmetric white matter hyperintensity in the deep white matter tracks of the left external capsule and left internal capsule. These areas do not show restricted diffusion. Ventricle size normal.  No midline shift.  Negative for hemorrhage Vascular: Normal arterial flow voids. Skull and upper cervical spine: Negative Sinuses/Orbits: Minimal mucosal edema paranasal sinuses. Negative orbit Other: None IMPRESSION: Restricted diffusion in the right basal ganglia corresponding to lenticulostriate territory. This is most consistent with acute infarction Hyperintensity and mild enlargement of the left head of caudate. Hyperintensity also in the left thalamus and a small amount of hyperintensity in the right medial thalamus. There is vasogenic type edema in the deep white matter structures on the left and to lesser extent in the deep white matter structures on the right. No restricted diffusion on the left. This is an unusual pattern. I would recommend postcontrast imaging of the brain to rule out tumor on the left. Subacute ischemia on the left appears unlikely given the vasogenic edema and unusual appearance of the head of the caudate and thalamus on the left. Also consider infection/cerebritis. Opportunistic infection such as toxoplasmosis possible. These results were called by telephone at the time  of interpretation on 05/26/2021 at 4:10 pm to provider [facility], who verbally acknowledged these results. Electronically Signed   By: Franchot Gallo M.D.   On: 05/26/2021 16:10   MR BRAIN W CONTRAST  Result Date: 05/26/2021 CLINICAL DATA:  Follow-up abnormal brain MRI earlier today. EXAM: MRI HEAD WITH CONTRAST TECHNIQUE: Multiplanar, multiecho pulse sequences of the brain and surrounding structures were obtained with intravenous contrast. CONTRAST:  6.42m GADAVIST GADOBUTROL 1 MMOL/ML IV SOLN COMPARISON:  Noncontrast brain MRI earlier today FINDINGS: There is very mild enhancement within the previously seen  regions of signal abnormality in the basal ganglia and thalami. Much of this enhancement appears vascular and perivascular. No solid enhancing mass is present. There is focal abnormal leptomeningeal enhancement over the right parietal convexity (series 6, image 6), and there may also be minimal leptomeningeal enhancement over the left parietal convexity (series 6, image 19). IMPRESSION: Mild, largely vascular/perivascular appearing enhancement in the areas of signal abnormality in the deep gray nuclei as well as a small amount of leptomeningeal enhancement over the right and possibly left parietal convexities. No solid enhancing mass. Differential diagnosis includes infection (including cryptococcus), vasculitis, unusual neoplasm (such as angiocentric lymphoma), and granulomatous/autoimmune/other inflammatory conditions. Electronically Signed   By: ALogan BoresM.D.   On: 05/26/2021 20:30   MR BRAIN W WO CONTRAST  Result Date: 06/02/2021 CLINICAL DATA:  Stroke follow-up. Negative diagnostic cerebral angiogram yesterday. EXAM: MRI HEAD WITHOUT AND WITH CONTRAST TECHNIQUE: Multiplanar, multiecho pulse sequences of the brain and surrounding structures were obtained without and with intravenous contrast. CONTRAST:  614mGADAVIST GADOBUTROL 1 MMOL/ML IV SOLN COMPARISON:  05/26/2021 FINDINGS: Brain: There are multiple subcentimeter acute infarcts scattered throughout both cerebral hemispheres primarily involving cortex and subcortical white matter of all lobes. Small acute infarcts are also present in the left caudate head, both thalami, and right and possibly left cerebellar hemispheres. Restricted diffusion and T2 hyperintensity in the right basal ganglia have mildly decreased. T2 hyperintensity involving the left basal ganglia, left greater than right thalami, and left greater than right internal and external capsules has also decreased with decreased enlargement/swelling of the left caudate and left thalamus. There  is some intrinsic T1 hyperintensity associated with the right basal ganglia infarct as well as some true, mild hazy as well as vascular appearing enhancement. The small amount of scattered enhancement within the left basal ganglia and left thalamus on the prior MRI has decreased. There may be a small amount of residual leptomeningeal enhancement within biparietal sulci (lack of sagittal T1 postcontrast imaging on today's study limits direct comparison). There is also minimal persistent smooth pachymeningeal enhancement over both cerebral convexities. There is mild generalized cerebral atrophy. No intracranial hemorrhage, midline shift, or extra-axial fluid collection is evident. Vascular: Major intracranial vascular flow voids are preserved. Skull and upper cervical spine: Unremarkable bone marrow signal. Sinuses/Orbits: Unremarkable orbits. Paranasal sinuses and mastoid air cells are clear. Other: None. IMPRESSION: 1. 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 2. Expected evolution of right basal ganglia infarct. 3. Decreased T2 signal abnormality and enhancement in the left greater than right deep gray nuclei and deep white matter tracts with differential considerations as previously outlined. Electronically Signed   By: AlLogan Bores.D.   On: 06/02/2021 15:56   CT ABDOMEN PELVIS W CONTRAST  Result Date: 05/25/2021 CLINICAL DATA:  Abdominal pain, fever EXAM: CT ABDOMEN AND PELVIS WITH CONTRAST TECHNIQUE: Multidetector CT imaging of the abdomen and pelvis was performed using the standard  protocol following bolus administration of intravenous contrast. CONTRAST:  145m OMNIPAQUE IOHEXOL 300 MG/ML  SOLN COMPARISON:  05/24/2021 FINDINGS: Lower chest: No acute abnormality. Hepatobiliary: Diffuse low-density throughout the liver compatible with fatty infiltration. No focal abnormality. Gallbladder unremarkable. Pancreas: No focal abnormality or ductal dilatation. Spleen:  No focal abnormality.  Normal size. Adrenals/Urinary Tract: No adrenal abnormality. No focal renal abnormality. No stones or hydronephrosis. Urinary bladder is unremarkable. Stomach/Bowel: Normal appendix. Stomach, large and small bowel grossly unremarkable. Vascular/Lymphatic: No evidence of aneurysm or adenopathy. Reproductive: Prior hysterectomy.  No adnexal masses. Other: No free fluid or free air. Musculoskeletal: No acute bony abnormality. IMPRESSION: Hepatic steatosis. No acute findings in the abdomen or pelvis. Electronically Signed   By: KRolm BaptiseM.D.   On: 05/25/2021 19:07   IR [place] Vasc Access Right  Result Date: 06/01/2021 INDICATION: 70year old female with past medical history significant for hypertension presenting with left-sided weakness and fever. 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 Given the possibility of CNS vasculitis, a diagnostic cerebral angiogram was requested. EXAM: ULTRASOUND-GUIDED VASCULAR ACCESS DIAGNOSTIC CEREBRAL ANGIOGRAM COMPARISON:  CT/CT angiogram of the head and neck May 26, 2021 MEDICATIONS: 5,000 IU heparin, 5 mg Verapamil and 200 mcg nitroglycerin ANESTHESIA/SEDATION: Versed 1 mg IV; Fentanyl 25 mcg IV Moderate Sedation Time:  46 minutes The patient was continuously monitored during the procedure by the interventional radiology nurse under my direct supervision. CONTRAST:  78 mL of Omnipaque 300 milligrams/mL FLUOROSCOPY TIME:  Fluoroscopy Time: 11 minutes 6 seconds (331 mGy). COMPLICATIONS: None immediate. TECHNIQUE: Informed written consent was obtained from the patient after a thorough discussion of the procedural risks, benefits and alternatives. All questions were addressed. Maximal Sterile Barrier Technique was utilized including caps, mask, sterile gowns, sterile gloves, sterile drape, hand hygiene and skin antiseptic. A timeout was performed prior to the initiation of  the procedure. Using the modified Seldinger technique and a micropuncture kit, access was gained to the distal right radial artery at the anatomical snuffbox and a 5 French sheath was placed. Slow intra arterial infusion of 5,000 IU heparin, 5 mg Verapamil and 200 mcg nitroglycerin diluted in patient's own blood was performed. No significant fluctuation in patient's blood pressure seen. Then, a right radial artery roadmap was obtained via sheath side port. Normal brachial artery branching pattern seen. No significant anatomical variation. The right radial artery caliber is adequate for vascular access. Next, a 5 [place] 2 glide catheter was navigated over a 0.035" Terumo Glidewire into the right subclavian artery under fluoroscopic guidance. Under fluoroscopy the catheter was placed into the right vertebral artery. Frontal, lateral and magnified bilateral oblique views of the head were obtained. The catheter was then placed into the right common carotid artery. Frontal and lateral views of the neck were obtained. Under biplane roadmap, the catheter was placed into the right internal carotid artery. Frontal, lateral and magnified oblique views of the head were obtained. The catheter was subsequently placed into the right external carotid artery. Frontal and lateral angiograms of the head were obtained. Next, the catheter was placed into the left common carotid artery. Frontal and lateral views of the neck were obtained followed by frontal, lateral and magnified oblique views of the head. The catheter was subsequently withdrawn. An inflatable band was placed and inflated over the right hand access site. The vascular sheath was withdrawn and the band was slowly deflated until brisk flow was noted through  the arteriotomy site. At this point, the band was reinflated with additional 2 cc of air to obtain patent hemostasis. FINDINGS: Right radial artery ultrasound and right radial artery angiogram: The caliber of  the distal right radial artery is appropriate for angiogram access. The right radial artery and the right ulnar artery have normal course and caliber. No significant anatomical variants noted. Right vertebral artery angiograms: The right vertebral artery, basilar artery, and bilateral posterior cerebral arteries are unremarkable. The left vertebral artery seen by contrast reflux and also appear unremarkable. Luminal caliber is smooth and tapering. No aneurysms or abnormally high-flow, early draining veins are seen. No regions of abnormal hypervascularity are noted. The visualized dural sinuses are patent. Right CCA angiograms: Cervical angiograms show normal course and caliber of the visualized right common carotid and internal carotid arteries. There are no significant stenoses. Right ICA angiograms: There is brisk vascular contrast filling of the ACA and MCA vascular trees. Luminal caliber is smooth and tapering. No aneurysms or abnormally high-flow, early draining veins are seen. No regions of abnormal hypervascularity are noted. The visualized dural sinuses are patent. Right ECA and right occipital angiograms: No early venous drainage was noted. The visualized branches of the right external carotid artery are unremarkable. Left CCA angiograms: Cervical angiograms show normal caliber of the visualized left common carotid and internal carotid arteries. Increased tortuosity of the mid cervical segment of the left ICA with mild luminal irregularity. There are no significant stenoses. Left CCA angiograms - cranial views: There is brisk vascular contrast filling of the ACA and MCA vascular trees. Luminal caliber is smooth and tapering. No aneurysms or abnormally high-flow, early draining veins are seen. No regions of abnormal hypervascularity are noted. The visualized dural sinuses are patent. The visualized branches of the left external carotid artery are unremarkable. PROCEDURE: No intervention performed.  IMPRESSION: 1. No angiographic evidence of vasculitis. 2. No significant intracranial stenosis, vasospasm, vascular occlusion, aneurysm, AVM, dural AV fistula or other significant vascular abnormality. PLAN: Results communicated to the neurology team. Electronically Signed   By: Pedro Earls M.D.   On: 06/01/2021 17:01   EEG adult  Result Date: 05/27/2021 Lora Havens, MD     05/27/2021  6:42 PM Patient Name: JUDETH GILLES MRN: [id number] Epilepsy Attending: Lora Havens Referring Physician/Provider: Dr Rosalin Hawking Date: 05/27/2021 Duration: 24.11 mins Patient history: 70yo F presented with falling at home, generalized weakness, fever, difficult waking with imbalance, lack of appetite. MRI showed hyperintensity and mild enlargement of the left head of caudate. EEG to evaluate for seizure Level of alertness: Awake AEDs during EEG study: TPM Technical aspects: This EEG study was done with scalp electrodes positioned according to the 10-20 International system of electrode placement. Electrical activity was acquired at a sampling rate of _0  and reviewed with a high frequency filter of _1  and a low frequency filter of _2 . EEG data were recorded continuously and digitally stored. Description: The posterior dominant rhythm consists of 9 Hz activity of moderate voltage (25-35 uV) seen predominantly in posterior head regions, symmetric and reactive to eye opening and eye closing. EEG showed intermittent 3 to 6 Hz theta-delta slowing in left temporal  region. Single sharp transient was seen in left temporal region. Hyperventilation and photic stimulation were not performed.   ABNORMALITY - Intermittent slow, generalized IMPRESSION: This study is suggestive of cortical dysfunction arising from left temporal region, nonspecific etiology. No seizures or definite epileptiform discharges were seen throughout the recording. If suspicion  for interictal activity remains a concern, a prolonged  study including sleep should be considered. Priyanka O Yadav   VAS [place] UPPER EXTREMITY ARTERIAL DUPLEX  Result Date: 06/02/2021  UPPER EXTREMITY DUPLEX STUDY Patient Name:  SHERALEE QAZI [facility]  Date of Exam:   06/01/2021 Medical Rec #: [id number]          Accession #:    [id number] Date of Birth: [birth year]          Patient Gender: F Patient Age:   69Y Exam Location:  [facility] Procedure:      VAS [place] UPPER EXTREMITY ARTERIAL DUPLEX Referring Phys: [place] --------------------------------------------------------------------------------  Indications: Bruising and Pain. History:     Patient has a history of catheterization via left radial artery.  Risk Factors: Hypertension. Limitations: TR band in place on the right wrist. Comparison Study: No prior studies. Performing Technologist: Oliver Hum RVT  Examination Guidelines: A complete evaluation includes B-mode imaging, spectral Doppler, color Doppler, and power Doppler as needed of all accessible portions of each vessel. Bilateral testing is considered an integral part of a complete examination. Limited examinations for reoccurring indications may be performed as noted.  Right Doppler Findings: +---------------+----------+---------+--------+--------+ Site           PSV (cm/s)Waveform StenosisComments +---------------+----------+---------+--------+--------+ Subclavian Dist          triphasic                 +---------------+----------+---------+--------+--------+ Brachial Dist            triphasic                 +---------------+----------+---------+--------+--------+ Radial Dist              triphasic                 +---------------+----------+---------+--------+--------+ Ulnar Dist               triphasic                 +---------------+----------+---------+--------+--------+ Palmar Arch              triphasic                 +---------------+----------+---------+--------+--------+ There is no evidence  of a pseudoaneurysm. There is evidence of a channel leading off of the distal radial artery. The channel is located beneath the TR band.   Electronically signed by Jamelle Haring on 06/02/2021 at 5:21:27 PM.    Final    ECHOCARDIOGRAM COMPLETE  Result Date: 05/26/2021    ECHOCARDIOGRAM REPORT   Patient Name:   ANAMARIA DUSENBURY [facility] Date of Exam: 05/26/2021 Medical Rec #:  [id number]         Height:       63.0 in Accession #:    [id number]        Weight:       134.0 lb Date of Birth:  [birth year]         BSA:          1.631 m Patient Age:    50 years          BP:           140/97 mmHg Patient Gender: F                 HR:           94 bpm. Exam Location:  Inpatient Procedure: 2D Echo, Cardiac Doppler and Color Doppler Indications:  Stroke  History:        Patient has no prior history of Echocardiogram examinations,                 most recent 08/18/2020. Risk Factors:Hypertension. CKD. Sepsis.  Sonographer:    Clayton Lefort RDCS (AE) Referring Phys: [id number] BELKYS A REGALADO IMPRESSIONS  1. Left ventricular ejection fraction, by estimation, is 60 to 65%. The left ventricle has normal function. The left ventricle has no regional wall motion abnormalities. There is moderate left ventricular hypertrophy. Left ventricular diastolic parameters are consistent with Grade I diastolic dysfunction (impaired relaxation).  2. Right ventricular systolic function is normal. The right ventricular size is normal. Tricuspid regurgitation signal is inadequate for assessing PA pressure.  3. The mitral valve is normal in structure. Mild mitral valve regurgitation. No evidence of mitral stenosis.  4. The aortic valve is grossly normal. There is mild calcification of the aortic valve. Aortic valve regurgitation is not visualized. Mild aortic valve sclerosis is present, with no evidence of aortic valve stenosis.  5. The inferior vena cava is normal in size with greater than 50% respiratory variability, suggesting right atrial pressure of 3 mmHg.  Conclusion(s)/Recommendation(s): No intracardiac source of embolism detected on this transthoracic study. A transesophageal echocardiogram is recommended to exclude cardiac source of embolism if clinically indicated. FINDINGS  Left Ventricle: Left ventricular ejection fraction, by estimation, is 60 to 65%. The left ventricle has normal function. The left ventricle has no regional wall motion abnormalities. The left ventricular internal cavity size was normal in size. There is  moderate left ventricular hypertrophy. Left ventricular diastolic parameters are consistent with Grade I diastolic dysfunction (impaired relaxation). Right Ventricle: The right ventricular size is normal. No increase in right ventricular wall thickness. Right ventricular systolic function is normal. Tricuspid regurgitation signal is inadequate for assessing PA pressure. Left Atrium: Left atrial size was normal in size. Right Atrium: Right atrial size was normal in size. Pericardium: There is no evidence of pericardial effusion. Mitral Valve: The mitral valve is normal in structure. Mild mitral valve regurgitation. No evidence of mitral valve stenosis. MV peak gradient, 2.9 mmHg. The mean mitral valve gradient is 1.0 mmHg. Tricuspid Valve: The tricuspid valve is normal in structure. Tricuspid valve regurgitation is not demonstrated. No evidence of tricuspid stenosis. Aortic Valve: The aortic valve is grossly normal. There is mild calcification of the aortic valve. Aortic valve regurgitation is not visualized. Mild aortic valve sclerosis is present, with no evidence of aortic valve stenosis. Aortic valve mean gradient  measures 3.0 mmHg. Aortic valve peak gradient measures 5.8 mmHg. Aortic valve area, by VTI measures 2.97 cm. Pulmonic Valve: The pulmonic valve was normal in structure. Pulmonic valve regurgitation is trivial. No evidence of pulmonic stenosis. Aorta: The aortic root is normal in size and structure. Venous: The inferior vena cava  is normal in size with greater than 50% respiratory variability, suggesting right atrial pressure of 3 mmHg. IAS/Shunts: No atrial level shunt detected by color flow Doppler.  LEFT VENTRICLE PLAX 2D LVIDd:         3.40 cm  Diastology LVIDs:         2.20 cm  LV e' medial:    8.70 cm/s LV PW:         1.30 cm  LV E/e' medial:  7.3 LV IVS:        1.40 cm  LV e' lateral:   13.20 cm/s LVOT diam:     2.00 cm  LV  E/e' lateral: 4.8 LV SV:         65 LV SV Index:   40 LVOT Area:     3.14 cm  RIGHT VENTRICLE             IVC RV Basal diam:  2.60 cm     IVC diam: 1.30 cm RV S prime:     18.50 cm/s TAPSE (M-mode): 2.8 cm LEFT ATRIUM           Index       RIGHT ATRIUM           Index LA diam:      2.50 cm 1.53 cm/m  RA Area:     12.50 cm LA Vol (A2C): 38.7 ml 23.73 ml/m RA Volume:   25.60 ml  15.70 ml/m LA Vol (A4C): 24.4 ml 14.96 ml/m  AORTIC VALVE AV Area (Vmax):    2.96 cm AV Area (Vmean):   2.80 cm AV Area (VTI):     2.97 cm AV Vmax:           120.00 cm/s AV Vmean:          84.100 cm/s AV VTI:            0.219 m AV Peak Grad:      5.8 mmHg AV Mean Grad:      3.0 mmHg LVOT Vmax:         113.00 cm/s LVOT Vmean:        74.900 cm/s LVOT VTI:          0.207 m LVOT/AV VTI ratio: 0.95  AORTA Ao Root diam: 3.30 cm Ao Asc diam:  3.40 cm MITRAL VALVE MV Area (PHT): 3.68 cm    SHUNTS MV Area VTI:   3.39 cm    Systemic VTI:  0.21 m MV Peak grad:  2.9 mmHg    Systemic Diam: 2.00 cm MV Mean grad:  1.0 mmHg MV Vmax:       0.85 m/s MV Vmean:      54.7 cm/s MV Decel Time: 206 msec MV E velocity: 63.40 cm/s MV A velocity: 76.00 cm/s MV E/A ratio:  0.83 Cherlynn Kaiser MD Electronically signed by Cherlynn Kaiser MD Signature Date/Time: 05/26/2021/7:31:57 PM    Final    CT Renal Stone Study  Result Date: 05/24/2021 CLINICAL DATA:  Flank pain, urinary frequency EXAM: CT ABDOMEN AND PELVIS WITHOUT CONTRAST TECHNIQUE: Multidetector CT imaging of the abdomen and pelvis was performed following the standard protocol without IV contrast.  COMPARISON:  None available FINDINGS: Lower chest: No acute abnormality. Hepatobiliary: Limited without IV contrast. Punctate hepatic dome calcified granuloma anteriorly. No large focal hepatic abnormality intrahepatic biliary dilatation. Gallbladder unremarkable. Common bile duct nondilated. Pancreas: Unremarkable. No pancreatic ductal dilatation or surrounding inflammatory changes. Spleen: Normal in size without focal abnormality. Adrenals/Urinary Tract: Adrenal glands are unremarkable. Kidneys are normal, without renal calculi, focal lesion, or hydronephrosis. Bladder is unremarkable. Stomach/Bowel: Negative for bowel obstruction, significant dilatation, ileus, or free air. Normal appendix demonstrated. No free fluid, fluid collection, hemorrhage, hematoma abscess, ascites. Vascular/Lymphatic: Minor aortic atherosclerosis and tortuosity. Negative for aneurysm. Limited assessment without IV contrast. Mild bilateral external iliac adenopathy, nonspecific. Left external iliac has a short axis diameter 1.3 cm. No inguinal adenopathy. No significant retroperitoneal adenopathy. Reproductive: Remote hysterectomy. Calcifications noted in the pelvis appearing benign or postoperative. No adnexal abnormality. No pelvic free fluid. Other: Small fat containing umbilical hernia. No large ventral hernia. No inguinal hernia. Negative for ascites. Musculoskeletal:  Thoracolumbar degenerative changes. Lower lumbar facet arthropathy. No acute osseous finding. IMPRESSION: No acute obstructing urinary tract or ureteral calculus. No obstructive uropathy or hydronephrosis. No other acute intra-abdominal or pelvic finding by noncontrast CT. Nonspecific bilateral mild external iliac adenopathy, short axis diameters measure up to 1.3 cm on the left. Aortic Atherosclerosis (ICD10-I70.0). Electronically Signed   By: Jerilynn Mages.  Shick M.D.   On: 05/24/2021 11:47   ECHO TEE  Result Date: 06/03/2021    TRANSESOPHOGEAL ECHO REPORT   Patient Name:    JUANELLE TRUEHEART [facility] Date of Exam: 06/03/2021 Medical Rec #:  [id number]         Height:       63.0 in Accession #:    [id number]        Weight:       134.0 lb Date of Birth:  1951-10-30         BSA:          1.631 m Patient Age:    49 years          BP:           152/96 mmHg Patient Gender: F                 HR:           100 bpm. Exam Location:  Inpatient Procedure: Transesophageal Echo, Color Doppler and Cardiac Doppler Indications:     stroke  History:         Patient has prior history of Echocardiogram examinations, most                  recent 05/26/2021. Risk Factors:Hypertension.  Sonographer:     Johny Chess Referring Phys:  [id number] PAULA V ROSS Diagnosing Phys: Oswaldo Milian MD PROCEDURE: After discussion of the risks and benefits of a TEE, an informed consent was obtained from the patient. The transesophogeal probe was passed without difficulty through the esophogus of the patient. Local oropharyngeal anesthetic was provided with Cetacaine. Sedation performed by different physician. The patient was monitored while under deep sedation. Anesthestetic sedation was provided intravenously by Anesthesiology: 257m of Propofol. The patient developed no complications during the procedure. IMPRESSIONS  1. Left ventricular ejection fraction, by estimation, is 60 to 65%. The left ventricle has normal function.  2. Right ventricular systolic function is normal. The right ventricular size is normal.  3. No left atrial/left atrial appendage thrombus was detected.  4. The mitral valve is normal in structure. Trivial mitral valve regurgitation.  5. The aortic valve is tricuspid. Aortic valve regurgitation is not visualized.  6. Agitated saline contrast bubble study was positive with shunting observed within 3-6 cardiac cycles suggestive of interatrial shunt. Significant shunting seen, suggestive of large patent foramen ovale. FINDINGS  Left Ventricle: Left ventricular ejection fraction, by estimation, is 60 to 65%.  The left ventricle has normal function. The left ventricular internal cavity size was normal in size. Right Ventricle: The right ventricular size is normal. No increase in right ventricular wall thickness. Right ventricular systolic function is normal. Left Atrium: Left atrial size was normal in size. No left atrial/left atrial appendage thrombus was detected. Right Atrium: Right atrial size was normal in size. Pericardium: There is no evidence of pericardial effusion. Mitral Valve: The mitral valve is normal in structure. Trivial mitral valve regurgitation. Tricuspid Valve: The tricuspid valve is normal in structure. Tricuspid valve regurgitation is trivial. Aortic Valve: The aortic valve is tricuspid. Aortic valve regurgitation is not visualized. Pulmonic Valve:  The pulmonic valve was grossly normal. Pulmonic valve regurgitation is mild. Aorta: The aortic root and ascending aorta are structurally normal, with no evidence of dilitation. IAS/Shunts: No atrial level shunt detected by color flow Doppler. Agitated saline contrast was given intravenously to evaluate for intracardiac shunting. Agitated saline contrast bubble study was positive with shunting observed within 3-6 cardiac cycles suggestive of interatrial shunt. A large patent foramen ovale is detected. Oswaldo Milian MD Electronically signed by Oswaldo Milian MD Signature Date/Time: 06/03/2021/3:09:44 PM    Final    VAS [place] LOWER EXTREMITY VENOUS (DVT)  Result Date: 05/25/2021  Lower Venous DVT Study Patient Name:  MACEL YEARSLEY [facility]  Date of Exam:   05/25/2021 Medical Rec #: [id number]          Accession #:    [id number] Date of Birth: [birth year]          Patient Gender: F Patient Age:   69Y Exam Location:  [facility] Procedure:      VAS [place] LOWER EXTREMITY VENOUS (DVT) Referring Phys: [id number] BELKYS A REGALADO --------------------------------------------------------------------------------  Indications: Edema.  Comparison Study: no prior  Performing Technologist: Archie Patten RVS  Examination Guidelines: A complete evaluation includes B-mode imaging, spectral Doppler, color Doppler, and power Doppler as needed of all accessible portions of each vessel. Bilateral testing is considered an integral part of a complete examination. Limited examinations for reoccurring indications may be performed as noted. The reflux portion of the exam is performed with the patient in reverse Trendelenburg.  +---------+---------------+---------+-----------+----------+--------------+ RIGHT    CompressibilityPhasicitySpontaneityPropertiesThrombus Aging +---------+---------------+---------+-----------+----------+--------------+ CFV      Full           Yes      Yes                                 +---------+---------------+---------+-----------+----------+--------------+ SFJ      Full                                                        +---------+---------------+---------+-----------+----------+--------------+ FV Prox  Full                                                        +---------+---------------+---------+-----------+----------+--------------+ FV Mid   Full                                                        +---------+---------------+---------+-----------+----------+--------------+ FV DistalFull                                                        +---------+---------------+---------+-----------+----------+--------------+ PFV      Full                                                        +---------+---------------+---------+-----------+----------+--------------+  POP      Full           Yes      Yes                                 +---------+---------------+---------+-----------+----------+--------------+ PTV      Full                                                        +---------+---------------+---------+-----------+----------+--------------+ PERO     Full                                                         +---------+---------------+---------+-----------+----------+--------------+   +---------+---------------+---------+-----------+----------+--------------+ LEFT     CompressibilityPhasicitySpontaneityPropertiesThrombus Aging +---------+---------------+---------+-----------+----------+--------------+ CFV      Full           Yes      Yes                                 +---------+---------------+---------+-----------+----------+--------------+ SFJ      Full                                                        +---------+---------------+---------+-----------+----------+--------------+ FV Prox  Full                                                        +---------+---------------+---------+-----------+----------+--------------+ FV Mid   Full                                                        +---------+---------------+---------+-----------+----------+--------------+ FV DistalFull                                                        +---------+---------------+---------+-----------+----------+--------------+ PFV      Full                                                        +---------+---------------+---------+-----------+----------+--------------+ POP      Full           Yes      Yes                                 +---------+---------------+---------+-----------+----------+--------------+  PTV      Full                                                        +---------+---------------+---------+-----------+----------+--------------+ PERO     Full                                                        +---------+---------------+---------+-----------+----------+--------------+     Summary: BILATERAL: - No evidence of deep vein thrombosis seen in the lower extremities, bilaterally. -No evidence of popliteal cyst, bilaterally.   *See table(s) above for measurements and observations. Electronically signed by Deitra Mayo MD on 05/25/2021 at 2:52:23 PM.    Final    IR ANGIO INTRA EXTRACRAN SEL COM CAROTID INNOMINATE UNI L MOD SED  Result Date: 06/01/2021 INDICATION: 70 year old female with past medical history significant for hypertension presenting with left-sided weakness and fever. 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 Given the possibility of CNS vasculitis, a diagnostic cerebral angiogram was requested. EXAM: ULTRASOUND-GUIDED VASCULAR ACCESS DIAGNOSTIC CEREBRAL ANGIOGRAM COMPARISON:  CT/CT angiogram of the head and neck May 26, 2021 MEDICATIONS: 5,000 IU heparin, 5 mg Verapamil and 200 mcg nitroglycerin ANESTHESIA/SEDATION: Versed 1 mg IV; Fentanyl 25 mcg IV Moderate Sedation Time:  46 minutes The patient was continuously monitored during the procedure by the interventional radiology nurse under my direct supervision. CONTRAST:  78 mL of Omnipaque 300 milligrams/mL FLUOROSCOPY TIME:  Fluoroscopy Time: 11 minutes 6 seconds (331 mGy). COMPLICATIONS: None immediate. TECHNIQUE: Informed written consent was obtained from the patient after a thorough discussion of the procedural risks, benefits and alternatives. All questions were addressed. Maximal Sterile Barrier Technique was utilized including caps, mask, sterile gowns, sterile gloves, sterile drape, hand hygiene and skin antiseptic. A timeout was performed prior to the initiation of the procedure. Using the modified Seldinger technique and a micropuncture kit, access was gained to the distal right radial artery at the anatomical snuffbox and a 5 French sheath was placed. Slow intra arterial infusion of 5,000 IU heparin, 5 mg Verapamil and 200 mcg nitroglycerin diluted in patient's own blood was performed. No significant fluctuation in patient's blood pressure seen. Then, a right radial artery roadmap was obtained via sheath side port. Normal brachial artery branching pattern seen.  No significant anatomical variation. The right radial artery caliber is adequate for vascular access. Next, a 5 [place] Simmons 2 glide catheter was navigated over a 0.035" Terumo Glidewire into the right subclavian artery under fluoroscopic guidance. Under fluoroscopy the catheter was placed into the right vertebral artery. Frontal, lateral and magnified bilateral oblique views of the head were obtained. The catheter was then placed into the right common carotid artery. Frontal and lateral views of the neck were obtained. Under biplane roadmap, the catheter was placed into the right internal carotid artery. Frontal, lateral and magnified oblique views of the head were obtained. The catheter was subsequently placed into the right external carotid artery. Frontal and lateral angiograms of the head were obtained. Next, the catheter was placed into the left common carotid artery. Frontal and lateral views of the  neck were obtained followed by frontal, lateral and magnified oblique views of the head. The catheter was subsequently withdrawn. An inflatable band was placed and inflated over the right hand access site. The vascular sheath was withdrawn and the band was slowly deflated until brisk flow was noted through the arteriotomy site. At this point, the band was reinflated with additional 2 cc of air to obtain patent hemostasis. FINDINGS: Right radial artery ultrasound and right radial artery angiogram: The caliber of the distal right radial artery is appropriate for angiogram access. The right radial artery and the right ulnar artery have normal course and caliber. No significant anatomical variants noted. Right vertebral artery angiograms: The right vertebral artery, basilar artery, and bilateral posterior cerebral arteries are unremarkable. The left vertebral artery seen by contrast reflux and also appear unremarkable. Luminal caliber is smooth and tapering. No aneurysms or abnormally high-flow, early draining veins  are seen. No regions of abnormal hypervascularity are noted. The visualized dural sinuses are patent. Right CCA angiograms: Cervical angiograms show normal course and caliber of the visualized right common carotid and internal carotid arteries. There are no significant stenoses. Right ICA angiograms: There is brisk vascular contrast filling of the ACA and MCA vascular trees. Luminal caliber is smooth and tapering. No aneurysms or abnormally high-flow, early draining veins are seen. No regions of abnormal hypervascularity are noted. The visualized dural sinuses are patent. Right ECA and right occipital angiograms: No early venous drainage was noted. The visualized branches of the right external carotid artery are unremarkable. Left CCA angiograms: Cervical angiograms show normal caliber of the visualized left common carotid and internal carotid arteries. Increased tortuosity of the mid cervical segment of the left ICA with mild luminal irregularity. There are no significant stenoses. Left CCA angiograms - cranial views: There is brisk vascular contrast filling of the ACA and MCA vascular trees. Luminal caliber is smooth and tapering. No aneurysms or abnormally high-flow, early draining veins are seen. No regions of abnormal hypervascularity are noted. The visualized dural sinuses are patent. The visualized branches of the left external carotid artery are unremarkable. PROCEDURE: No intervention performed. IMPRESSION: 1. No angiographic evidence of vasculitis. 2. No significant intracranial stenosis, vasospasm, vascular occlusion, aneurysm, AVM, dural AV fistula or other significant vascular abnormality. PLAN: Results communicated to the neurology team. Electronically Signed   By: Pedro Earls M.D.   On: 06/01/2021 17:01   IR ANGIO INTRA EXTRACRAN SEL INTERNAL CAROTID UNI R MOD SED  Result Date: 06/01/2021 INDICATION: 70 year old female with past medical history significant for hypertension  presenting with left-sided weakness and fever. 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 Given the possibility of CNS vasculitis, a diagnostic cerebral angiogram was requested. EXAM: ULTRASOUND-GUIDED VASCULAR ACCESS DIAGNOSTIC CEREBRAL ANGIOGRAM COMPARISON:  CT/CT angiogram of the head and neck May 26, 2021 MEDICATIONS: 5,000 IU heparin, 5 mg Verapamil and 200 mcg nitroglycerin ANESTHESIA/SEDATION: Versed 1 mg IV; Fentanyl 25 mcg IV Moderate Sedation Time:  46 minutes The patient was continuously monitored during the procedure by the interventional radiology nurse under my direct supervision. CONTRAST:  78 mL of Omnipaque 300 milligrams/mL FLUOROSCOPY TIME:  Fluoroscopy Time: 11 minutes 6 seconds (331 mGy). COMPLICATIONS: None immediate. TECHNIQUE: Informed written consent was obtained from the patient after a thorough discussion of the procedural risks, benefits and alternatives. All questions were addressed. Maximal Sterile Barrier Technique was utilized including caps, mask, sterile gowns, sterile gloves, sterile  drape, hand hygiene and skin antiseptic. A timeout was performed prior to the initiation of the procedure. Using the modified Seldinger technique and a micropuncture kit, access was gained to the distal right radial artery at the anatomical snuffbox and a 5 French sheath was placed. Slow intra arterial infusion of 5,000 IU heparin, 5 mg Verapamil and 200 mcg nitroglycerin diluted in patient's own blood was performed. No significant fluctuation in patient's blood pressure seen. Then, a right radial artery roadmap was obtained via sheath side port. Normal brachial artery branching pattern seen. No significant anatomical variation. The right radial artery caliber is adequate for vascular access. Next, a 5 [place] Simmons 2 glide catheter was navigated over a 0.035" Terumo Glidewire into the right subclavian artery under  fluoroscopic guidance. Under fluoroscopy the catheter was placed into the right vertebral artery. Frontal, lateral and magnified bilateral oblique views of the head were obtained. The catheter was then placed into the right common carotid artery. Frontal and lateral views of the neck were obtained. Under biplane roadmap, the catheter was placed into the right internal carotid artery. Frontal, lateral and magnified oblique views of the head were obtained. The catheter was subsequently placed into the right external carotid artery. Frontal and lateral angiograms of the head were obtained. Next, the catheter was placed into the left common carotid artery. Frontal and lateral views of the neck were obtained followed by frontal, lateral and magnified oblique views of the head. The catheter was subsequently withdrawn. An inflatable band was placed and inflated over the right hand access site. The vascular sheath was withdrawn and the band was slowly deflated until brisk flow was noted through the arteriotomy site. At this point, the band was reinflated with additional 2 cc of air to obtain patent hemostasis. FINDINGS: Right radial artery ultrasound and right radial artery angiogram: The caliber of the distal right radial artery is appropriate for angiogram access. The right radial artery and the right ulnar artery have normal course and caliber. No significant anatomical variants noted. Right vertebral artery angiograms: The right vertebral artery, basilar artery, and bilateral posterior cerebral arteries are unremarkable. The left vertebral artery seen by contrast reflux and also appear unremarkable. Luminal caliber is smooth and tapering. No aneurysms or abnormally high-flow, early draining veins are seen. No regions of abnormal hypervascularity are noted. The visualized dural sinuses are patent. Right CCA angiograms: Cervical angiograms show normal course and caliber of the visualized right common carotid and internal  carotid arteries. There are no significant stenoses. Right ICA angiograms: There is brisk vascular contrast filling of the ACA and MCA vascular trees. Luminal caliber is smooth and tapering. No aneurysms or abnormally high-flow, early draining veins are seen. No regions of abnormal hypervascularity are noted. The visualized dural sinuses are patent. Right ECA and right occipital angiograms: No early venous drainage was noted. The visualized branches of the right external carotid artery are unremarkable. Left CCA angiograms: Cervical angiograms show normal caliber of the visualized left common carotid and internal carotid arteries. Increased tortuosity of the mid cervical segment of the left ICA with mild luminal irregularity. There are no significant stenoses. Left CCA angiograms - cranial views: There is brisk vascular contrast filling of the ACA and MCA vascular trees. Luminal caliber is smooth and tapering. No aneurysms or abnormally high-flow, early draining veins are seen. No regions of abnormal hypervascularity are noted. The visualized dural sinuses are patent. The visualized branches of the left external carotid artery are unremarkable. PROCEDURE: No intervention performed.  IMPRESSION: 1. No angiographic evidence of vasculitis. 2. No significant intracranial stenosis, vasospasm, vascular occlusion, aneurysm, AVM, dural AV fistula or other significant vascular abnormality. PLAN: Results communicated to the neurology team. Electronically Signed   By: Pedro Earls M.D.   On: 06/01/2021 17:01   IR ANGIO VERTEBRAL SEL VERTEBRAL UNI R MOD SED  Result Date: 06/01/2021 INDICATION: 70 year old female with past medical history significant for hypertension presenting with left-sided weakness and fever. 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 Given the possibility of CNS vasculitis, a diagnostic cerebral angiogram  was requested. EXAM: ULTRASOUND-GUIDED VASCULAR ACCESS DIAGNOSTIC CEREBRAL ANGIOGRAM COMPARISON:  CT/CT angiogram of the head and neck May 26, 2021 MEDICATIONS: 5,000 IU heparin, 5 mg Verapamil and 200 mcg nitroglycerin ANESTHESIA/SEDATION: Versed 1 mg IV; Fentanyl 25 mcg IV Moderate Sedation Time:  46 minutes The patient was continuously monitored during the procedure by the interventional radiology nurse under my direct supervision. CONTRAST:  78 mL of Omnipaque 300 milligrams/mL FLUOROSCOPY TIME:  Fluoroscopy Time: 11 minutes 6 seconds (331 mGy). COMPLICATIONS: None immediate. TECHNIQUE: Informed written consent was obtained from the patient after a thorough discussion of the procedural risks, benefits and alternatives. All questions were addressed. Maximal Sterile Barrier Technique was utilized including caps, mask, sterile gowns, sterile gloves, sterile drape, hand hygiene and skin antiseptic. A timeout was performed prior to the initiation of the procedure. Using the modified Seldinger technique and a micropuncture kit, access was gained to the distal right radial artery at the anatomical snuffbox and a 5 French sheath was placed. Slow intra arterial infusion of 5,000 IU heparin, 5 mg Verapamil and 200 mcg nitroglycerin diluted in patient's own blood was performed. No significant fluctuation in patient's blood pressure seen. Then, a right radial artery roadmap was obtained via sheath side port. Normal brachial artery branching pattern seen. No significant anatomical variation. The right radial artery caliber is adequate for vascular access. Next, a 5 [place] Simmons 2 glide catheter was navigated over a 0.035" Terumo Glidewire into the right subclavian artery under fluoroscopic guidance. Under fluoroscopy the catheter was placed into the right vertebral artery. Frontal, lateral and magnified bilateral oblique views of the head were obtained. The catheter was then placed into the right common carotid artery.  Frontal and lateral views of the neck were obtained. Under biplane roadmap, the catheter was placed into the right internal carotid artery. Frontal, lateral and magnified oblique views of the head were obtained. The catheter was subsequently placed into the right external carotid artery. Frontal and lateral angiograms of the head were obtained. Next, the catheter was placed into the left common carotid artery. Frontal and lateral views of the neck were obtained followed by frontal, lateral and magnified oblique views of the head. The catheter was subsequently withdrawn. An inflatable band was placed and inflated over the right hand access site. The vascular sheath was withdrawn and the band was slowly deflated until brisk flow was noted through the arteriotomy site. At this point, the band was reinflated with additional 2 cc of air to obtain patent hemostasis. FINDINGS: Right radial artery ultrasound and right radial artery angiogram: The caliber of the distal right radial artery is appropriate for angiogram access. The right radial artery and the right ulnar artery have normal course and caliber. No significant anatomical variants noted. Right vertebral artery angiograms: The right vertebral artery, basilar artery, and bilateral posterior cerebral arteries are unremarkable. The left  vertebral artery seen by contrast reflux and also appear unremarkable. Luminal caliber is smooth and tapering. No aneurysms or abnormally high-flow, early draining veins are seen. No regions of abnormal hypervascularity are noted. The visualized dural sinuses are patent. Right CCA angiograms: Cervical angiograms show normal course and caliber of the visualized right common carotid and internal carotid arteries. There are no significant stenoses. Right ICA angiograms: There is brisk vascular contrast filling of the ACA and MCA vascular trees. Luminal caliber is smooth and tapering. No aneurysms or abnormally high-flow, early draining  veins are seen. No regions of abnormal hypervascularity are noted. The visualized dural sinuses are patent. Right ECA and right occipital angiograms: No early venous drainage was noted. The visualized branches of the right external carotid artery are unremarkable. Left CCA angiograms: Cervical angiograms show normal caliber of the visualized left common carotid and internal carotid arteries. Increased tortuosity of the mid cervical segment of the left ICA with mild luminal irregularity. There are no significant stenoses. Left CCA angiograms - cranial views: There is brisk vascular contrast filling of the ACA and MCA vascular trees. Luminal caliber is smooth and tapering. No aneurysms or abnormally high-flow, early draining veins are seen. No regions of abnormal hypervascularity are noted. The visualized dural sinuses are patent. The visualized branches of the left external carotid artery are unremarkable. PROCEDURE: No intervention performed. IMPRESSION: 1. No angiographic evidence of vasculitis. 2. No significant intracranial stenosis, vasospasm, vascular occlusion, aneurysm, AVM, dural AV fistula or other significant vascular abnormality. PLAN: Results communicated to the neurology team. Electronically Signed   By: Pedro Earls M.D.   On: 06/01/2021 17:01   IR ANGIO EXTERNAL CAROTID SEL EXT CAROTID UNI R MOD SED  Result Date: 06/01/2021 INDICATION: 70 year old female with past medical history significant for hypertension presenting with left-sided weakness and fever. 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 Given the possibility of CNS vasculitis, a diagnostic cerebral angiogram was requested. EXAM: ULTRASOUND-GUIDED VASCULAR ACCESS DIAGNOSTIC CEREBRAL ANGIOGRAM COMPARISON:  CT/CT angiogram of the head and neck May 26, 2021 MEDICATIONS: 5,000 IU heparin, 5 mg Verapamil and 200 mcg nitroglycerin  ANESTHESIA/SEDATION: Versed 1 mg IV; Fentanyl 25 mcg IV Moderate Sedation Time:  46 minutes The patient was continuously monitored during the procedure by the interventional radiology nurse under my direct supervision. CONTRAST:  78 mL of Omnipaque 300 milligrams/mL FLUOROSCOPY TIME:  Fluoroscopy Time: 11 minutes 6 seconds (331 mGy). COMPLICATIONS: None immediate. TECHNIQUE: Informed written consent was obtained from the patient after a thorough discussion of the procedural risks, benefits and alternatives. All questions were addressed. Maximal Sterile Barrier Technique was utilized including caps, mask, sterile gowns, sterile gloves, sterile drape, hand hygiene and skin antiseptic. A timeout was performed prior to the initiation of the procedure. Using the modified Seldinger technique and a micropuncture kit, access was gained to the distal right radial artery at the anatomical snuffbox and a 5 French sheath was placed. Slow intra arterial infusion of 5,000 IU heparin, 5 mg Verapamil and 200 mcg nitroglycerin diluted in patient's own blood was performed. No significant fluctuation in patient's blood pressure seen. Then, a right radial artery roadmap was obtained via sheath side port. Normal brachial artery branching pattern seen. No significant anatomical variation. The right radial artery caliber is adequate for vascular access. Next, a 5 [place] Simmons 2 glide catheter was navigated over a 0.035" Terumo Glidewire into the right subclavian artery under fluoroscopic  guidance. Under fluoroscopy the catheter was placed into the right vertebral artery. Frontal, lateral and magnified bilateral oblique views of the head were obtained. The catheter was then placed into the right common carotid artery. Frontal and lateral views of the neck were obtained. Under biplane roadmap, the catheter was placed into the right internal carotid artery. Frontal, lateral and magnified oblique views of the head were obtained. The  catheter was subsequently placed into the right external carotid artery. Frontal and lateral angiograms of the head were obtained. Next, the catheter was placed into the left common carotid artery. Frontal and lateral views of the neck were obtained followed by frontal, lateral and magnified oblique views of the head. The catheter was subsequently withdrawn. An inflatable band was placed and inflated over the right hand access site. The vascular sheath was withdrawn and the band was slowly deflated until brisk flow was noted through the arteriotomy site. At this point, the band was reinflated with additional 2 cc of air to obtain patent hemostasis. FINDINGS: Right radial artery ultrasound and right radial artery angiogram: The caliber of the distal right radial artery is appropriate for angiogram access. The right radial artery and the right ulnar artery have normal course and caliber. No significant anatomical variants noted. Right vertebral artery angiograms: The right vertebral artery, basilar artery, and bilateral posterior cerebral arteries are unremarkable. The left vertebral artery seen by contrast reflux and also appear unremarkable. Luminal caliber is smooth and tapering. No aneurysms or abnormally high-flow, early draining veins are seen. No regions of abnormal hypervascularity are noted. The visualized dural sinuses are patent. Right CCA angiograms: Cervical angiograms show normal course and caliber of the visualized right common carotid and internal carotid arteries. There are no significant stenoses. Right ICA angiograms: There is brisk vascular contrast filling of the ACA and MCA vascular trees. Luminal caliber is smooth and tapering. No aneurysms or abnormally high-flow, early draining veins are seen. No regions of abnormal hypervascularity are noted. The visualized dural sinuses are patent. Right ECA and right occipital angiograms: No early venous drainage was noted. The visualized branches of the  right external carotid artery are unremarkable. Left CCA angiograms: Cervical angiograms show normal caliber of the visualized left common carotid and internal carotid arteries. Increased tortuosity of the mid cervical segment of the left ICA with mild luminal irregularity. There are no significant stenoses. Left CCA angiograms - cranial views: There is brisk vascular contrast filling of the ACA and MCA vascular trees. Luminal caliber is smooth and tapering. No aneurysms or abnormally high-flow, early draining veins are seen. No regions of abnormal hypervascularity are noted. The visualized dural sinuses are patent. The visualized branches of the left external carotid artery are unremarkable. PROCEDURE: No intervention performed. IMPRESSION: 1. No angiographic evidence of vasculitis. 2. No significant intracranial stenosis, vasospasm, vascular occlusion, aneurysm, AVM, dural AV fistula or other significant vascular abnormality. PLAN: Results communicated to the neurology team. Electronically Signed   By: Pedro Earls M.D.   On: 06/01/2021 17:01     Subjective: Patient seen examined bedside, resting comfortably.  No complaints this morning.  Discharging to CIR today.  Denies headache, no fever/chills/night sweats, no nausea/vomiting/diarrhea, no chest pain, no palpitations, no shortness of breath, no abdominal pain, no weakness, no fatigue, no paresthesias.  No acute event overnight per nursing staff.  Discharge Exam: Vitals:   06/08/21 0822 06/08/21 0829  BP: (!) 140/93 (!) 140/93  Pulse:    Resp:    Temp:  SpO2:     Vitals:   06/07/21 2031 06/08/21 0528 06/08/21 0822 06/08/21 0829  BP: (!) 141/114 (!) 138/91 (!) 140/93 (!) 140/93  Pulse: 75 89    Resp: 18 18    Temp: 98.3 F (36.8 C) 98.2 F (36.8 C)    TempSrc: Oral Oral    SpO2: 95% 94%    Weight:      Height:        General: Pt is alert, awake, not in acute distress, elderly in appearance Cardiovascular: RRR,  S1/S2 +, no rubs, no gallops Respiratory: CTA bilaterally, no wheezing, no rhonchi, on room air Abdominal: Soft, NT, ND, bowel sounds + Extremities: Mild edema right hand with ecchymosis which is improved, no cyanosis    The results of significant diagnostics from this hospitalization (including imaging, microbiology, ancillary and laboratory) are listed below for reference.     Microbiology: Recent Results (from the past 240 hour(s))  Culture, blood (routine x 2)     Status: None   Collection Time: 05/30/21  4:11 PM   Specimen: BLOOD RIGHT HAND  Result Value Ref Range Status   Specimen Description BLOOD RIGHT HAND  Final   Special Requests   Final    BOTTLES DRAWN AEROBIC AND ANAEROBIC Blood Culture results may not be optimal due to an inadequate volume of blood received in culture bottles   Culture   Final    NO GROWTH 5 DAYS Performed at [facility], [place] [address] [place], [place] [address]    Report Status 06/04/2021 FINAL  Final  Culture, blood (routine x 2)     Status: None   Collection Time: 05/30/21  4:11 PM   Specimen: BLOOD RIGHT HAND  Result Value Ref Range Status   Specimen Description BLOOD RIGHT HAND  Final   Special Requests   Final    BOTTLES DRAWN AEROBIC ONLY Blood Culture results may not be optimal due to an inadequate volume of blood received in culture bottles   Culture   Final    NO GROWTH 5 DAYS Performed at [facility], [place] [address] [place], [place] [address]    Report Status 06/04/2021 FINAL  Final     Labs: BNP (last 3 results) No results for input(s): BNP in the last 8760 hours. Basic Metabolic Panel: Recent Labs  Lab 06/02/21 0826 06/07/21 0140  NA 140 133*  K 3.7 3.8  CL 111 105  CO2 20* 20*  GLUCOSE 123* 90  BUN 15 16  CREATININE 0.66 0.76  CALCIUM 9.1 8.7*  MG  --  2.1   Liver Function Tests: No results for input(s): AST, ALT, ALKPHOS, BILITOT, PROT, ALBUMIN in the last 168 hours. No results for input(s):  LIPASE, AMYLASE in the last 168 hours. No results for input(s): AMMONIA in the last 168 hours. CBC: Recent Labs  Lab 06/02/21 0826 06/07/21 0140  WBC 10.0 10.4  HGB 11.5* 9.9*  HCT 34.8* 30.5*  MCV 89.5 90.5  PLT 348 405*   Cardiac Enzymes: No results for input(s): CKTOTAL, CKMB, CKMBINDEX, TROPONINI in the last 168 hours. BNP: Invalid input(s): POCBNP CBG: Recent Labs  Lab 06/02/21 1350 06/04/21 0730  GLUCAP 169* 105*   D-Dimer No results for input(s): DDIMER in the last 72 hours. Hgb A1c No results for input(s): HGBA1C in the last 72 hours. Lipid Profile No results for input(s): CHOL, HDL, LDLCALC, TRIG, CHOLHDL, LDLDIRECT in the last 72 hours. Thyroid function studies No results for input(s):  TSH, T4TOTAL, T3FREE, THYROIDAB in the last 72 hours.  Invalid input(s): FREET3 Anemia work up No results for input(s): VITAMINB12, FOLATE, FERRITIN, TIBC, IRON, RETICCTPCT in the last 72 hours. Urinalysis    Component Value Date/Time   COLORURINE YELLOW 05/24/2021 1002   APPEARANCEUR CLEAR 05/24/2021 1002   LABSPEC 1.010 05/24/2021 1002   PHURINE 6.0 05/24/2021 1002   GLUCOSEU NEGATIVE 05/24/2021 1002   GLUCOSEU NEGATIVE 04/07/2021 1450   HGBUR SMALL (A) 05/24/2021 1002   BILIRUBINUR NEGATIVE 05/24/2021 1002   BILIRUBINUR negative 09/29/2020 [place] 05/24/2021 1002   PROTEINUR 30 (A) 05/24/2021 1002   UROBILINOGEN 0.2 04/07/2021 1450   NITRITE NEGATIVE 05/24/2021 1002   LEUKOCYTESUR NEGATIVE 05/24/2021 1002   Sepsis Labs Invalid input(s): PROCALCITONIN,  WBC,  LACTICIDVEN Microbiology Recent Results (from the past 240 hour(s))  Culture, blood (routine x 2)     Status: None   Collection Time: 05/30/21  4:11 PM   Specimen: BLOOD RIGHT HAND  Result Value Ref Range Status   Specimen Description BLOOD RIGHT HAND  Final   Special Requests   Final    BOTTLES DRAWN AEROBIC AND ANAEROBIC Blood Culture results may not be optimal due to an inadequate  volume of blood received in culture bottles   Culture   Final    NO GROWTH 5 DAYS Performed at [facility], [place] [address] [place], [place] [address]    Report Status 06/04/2021 FINAL  Final  Culture, blood (routine x 2)     Status: None   Collection Time: 05/30/21  4:11 PM   Specimen: BLOOD RIGHT HAND  Result Value Ref Range Status   Specimen Description BLOOD RIGHT HAND  Final   Special Requests   Final    BOTTLES DRAWN AEROBIC ONLY Blood Culture results may not be optimal due to an inadequate volume of blood received in culture bottles   Culture   Final    NO GROWTH 5 DAYS Performed at [facility], [place] [address], [place],  [address]    Report Status 06/04/2021 FINAL  Final     Time coordinating discharge: Over 30 minutes  SIGNED:   Roosevelt Bisher J [place] DO  [facility] 06/08/2021, 10:08 AM

## 2021-06-08 NOTE — Progress Notes (Signed)
Inpatient Rehab Admissions Coordinator:   I have a bed for this pt. Today on CIR. I will plan for transfer later today.  RN may call report to [phone] after 12pm  Clemens Catholic, [place], [place] Admissions Coordinator  [phone] (celll) [phone] (office)

## 2021-06-08 NOTE — Progress Notes (Signed)
Inpatient Rehabilitation Medication Review by a Pharmacist  A complete drug regimen review was completed for this patient to identify any potential clinically significant medication issues.  Clinically significant medication issues were identified:  no  Check AMION for pharmacist assigned to patient if future medication questions/issues arise during this admission.  Pharmacist comments: None  Time spent performing this drug regimen review (minutes):  10 minutes   Tad Moore 06/08/2021 5:56 PM

## 2021-06-08 NOTE — Progress Notes (Signed)
Patient arrived on unit, oriented to unit. Reviewed medications, therapy schedule, rehab routine and plan of care. States an understanding of information reviewed. No complications noted at this time. Patient reports no pain and is AX4 Michaela Rios Michaela Rios  

## 2021-06-08 NOTE — Discharge Instructions (Signed)

## 2021-06-08 NOTE — Progress Notes (Signed)
Physical Therapy Treatment Patient Details Name: Michaela Rios MRN: [id number] DOB: 05/09/1951 Today's Date: 06/08/2021    History of Present Illness Pt is a 70 y.o. female admitted 05/24/21 with c/o weakness, fall due to loss of balance, poor PO intake. Workup for sepsis, unclear etiology. Pt with confusion, neuro consult for possible CNS infection. Brain MRI 6/29 showed R basal ganglia stroke in addition to accelerated small vessel disease. 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 PMH includes HTN, CKD 3, migraines, chronic LBP, prediabetes, anxiety, depression.    PT Comments    Pt required mod assist transfers and mod assist ambulation 62' with RW. Sequencing cues 75% of time during ambulation. Pt returned to recliner with feet elevated at end of session.    Follow Up Recommendations  CIR;Supervision/Assistance - 24 hour     Equipment Recommendations  None recommended by PT    Recommendations for Other Services       Precautions / Restrictions Precautions Precautions: Fall Precaution Comments: urinary urgency/incontinence (has Depends in room) Restrictions Weight Bearing Restrictions: No    Mobility  Bed Mobility               General bed mobility comments: up in recliner    Transfers Overall transfer level: Needs assistance Equipment used: Rolling walker (2 wheeled) Transfers: Sit to/from Stand Sit to Stand: Mod assist         General transfer comment: Cues for sequencing and hand placement, increased time  Ambulation/Gait Ambulation/Gait assistance: Mod assist Gait Distance (Feet): 35 Feet Assistive device: Rolling walker (2 wheeled) Gait Pattern/deviations: Decreased step length - left;Trunk flexed;Step-through pattern;Staggering right Gait velocity: decreased Gait velocity interpretation: <1.8 ft/sec, indicate of risk for recurrent falls General Gait Details: assist to maintain balance and manage  RW. Verbal cues for sequencing 75% of time. Increased processing time when cueing for sequencing.   Stairs             Wheelchair Mobility    Modified Rankin (Stroke Patients Only) Modified Rankin (Stroke Patients Only) Pre-Morbid Rankin Score: No significant disability Modified Rankin: Moderately severe disability     Balance Overall balance assessment: Needs assistance Sitting-balance support: Single extremity supported;Feet supported Sitting balance-Leahy Scale: Fair   Postural control: Right lateral lean;Posterior lean Standing balance support: Bilateral upper extremity supported;During functional activity Standing balance-Leahy Scale: Poor Standing balance comment: reliant on external support                            Cognition Arousal/Alertness: Awake/alert Behavior During Therapy: Flat affect Overall Cognitive Status: Impaired/Different from baseline Area of Impairment: Attention;Following commands;Safety/judgement;Awareness;Problem solving;Memory                   Current Attention Level: Sustained Memory: Decreased short-term memory Following Commands: Follows one step commands consistently;Follows multi-step commands with increased time;Follows multi-step commands inconsistently Safety/Judgement: Decreased awareness of safety;Decreased awareness of deficits Awareness: Emergent Problem Solving: Slow processing;Requires verbal cues;Requires tactile cues;Difficulty sequencing;Decreased initiation        Exercises      General Comments        Pertinent Vitals/Pain Pain Assessment: No/denies pain    Home Living                      Prior Function            PT Goals (current goals can now be found in the care plan  section) Acute Rehab PT Goals Patient Stated Goal: Ready to go to rehab Progress towards PT goals: Progressing toward goals    Frequency    Min 4X/week      PT Plan Current plan remains appropriate     Co-evaluation              AM-PAC PT "6 Clicks" Mobility   Outcome Measure  Help needed turning from your back to your side while in a flat bed without using bedrails?: A Little Help needed moving from lying on your back to sitting on the side of a flat bed without using bedrails?: A Lot Help needed moving to and from a bed to a chair (including a wheelchair)?: A Lot Help needed standing up from a chair using your arms (e.g., wheelchair or bedside chair)?: A Lot Help needed to walk in hospital room?: A Lot Help needed climbing 3-5 steps with a railing? : Total 6 Click Score: 12    End of Session Equipment Utilized During Treatment: Gait belt Activity Tolerance: Patient tolerated treatment well Patient left: in chair;with call bell/phone within reach;with chair alarm set Nurse Communication: Mobility status PT Visit Diagnosis: Other abnormalities of gait and mobility (R26.89);Muscle weakness (generalized) (M62.81);Other symptoms and signs involving the nervous system (R29.898)     Time: 1000-1014 PT Time Calculation (min) (ACUTE ONLY): 14 min  Charges:  $Gait Training: 8-22 mins                     [name], PT  Office # [phone] Pager [phone]    [name] 06/08/2021, 11:30 AM

## 2021-06-08 NOTE — TOC Transition Note (Signed)
Transition of Care [facility]) - CM/SW Discharge Note   Patient Details  Name: Michaela Rios MRN: [id number] Date of Birth: [birth year]  Transition [facility]) CM/SW Contact:  Zenon Mayo, RN Phone Number: 06/08/2021, 11:54 AM   Clinical Narrative:    Patient is for dc to CIR today.   Final next level of care: IP Rehab Facility Barriers to Discharge: No Barriers Identified   Patient Goals and CMS Choice        Discharge Placement                       Discharge Plan and Services                                     Social Determinants of Health (SDOH) Interventions     Readmission Risk Interventions No flowsheet data found.

## 2021-06-09 ENCOUNTER — Encounter (HOSPITAL_COMMUNITY): Payer: Self-pay | Admitting: Cardiology

## 2021-06-09 LAB — COMPREHENSIVE METABOLIC PANEL
ALT: 21 U/L (ref 0–44)
AST: 26 U/L (ref 15–41)
Albumin: 2.6 g/dL — ABNORMAL LOW (ref 3.5–5.0)
Alkaline Phosphatase: 61 U/L (ref 38–126)
Anion gap: 7 (ref 5–15)
BUN: 10 mg/dL (ref 8–23)
CO2: 20 mmol/L — ABNORMAL LOW (ref 22–32)
Calcium: 8.8 mg/dL — ABNORMAL LOW (ref 8.9–10.3)
Chloride: 110 mmol/L (ref 98–111)
Creatinine, Ser: 0.73 mg/dL (ref 0.44–1.00)
GFR, Estimated: >60 mL/min (ref >60)
Glucose, Bld: 86 mg/dL (ref 70–99)
Potassium: 3.5 mmol/L (ref 3.5–5.1)
Sodium: 137 mmol/L (ref 135–145)
Total Bilirubin: 0.6 mg/dL (ref 0.3–1.2)
Total Protein: 6.3 g/dL — ABNORMAL LOW (ref 6.5–8.1)

## 2021-06-09 LAB — CBC WITH DIFFERENTIAL/PLATELET
Abs Immature Granulocytes: 0.23 10*3/uL — ABNORMAL HIGH (ref 0.00–0.07)
Basophils Absolute: 0.1 10*3/uL (ref 0.0–0.1)
Basophils Relative: 1 %
Eosinophils Absolute: 0.3 10*3/uL (ref 0.0–0.5)
Eosinophils Relative: 5 %
HCT: 30.2 % — ABNORMAL LOW (ref 36.0–46.0)
Hemoglobin: 9.9 g/dL — ABNORMAL LOW (ref 12.0–15.0)
Immature Granulocytes: 4 %
Lymphocytes Relative: 11 %
Lymphs Abs: 0.6 10*3/uL — ABNORMAL LOW (ref 0.7–4.0)
MCH: 29.6 pg (ref 26.0–34.0)
MCHC: 32.8 g/dL (ref 30.0–36.0)
MCV: 90.1 fL (ref 80.0–100.0)
Monocytes Absolute: 0.6 10*3/uL (ref 0.1–1.0)
Monocytes Relative: 11 %
Neutro Abs: 3.7 10*3/uL (ref 1.7–7.7)
Neutrophils Relative %: 68 %
Platelets: 340 10*3/uL (ref 150–400)
RBC: 3.35 MIL/uL — ABNORMAL LOW (ref 3.87–5.11)
RDW: 16.1 % — ABNORMAL HIGH (ref 11.5–15.5)
WBC: 5.5 10*3/uL (ref 4.0–10.5)
nRBC: 0 % (ref 0.0–0.2)

## 2021-06-09 MED FILL — Lidocaine Inj 1% w/ Epinephrine-1:100000: INTRAMUSCULAR | Qty: 20 | Status: AC

## 2021-06-09 NOTE — Plan of Care (Signed)
  Problem: RH Balance Goal: LTG: Patient will maintain dynamic sitting balance (OT) Description: LTG:  Patient will maintain dynamic sitting balance with assistance during activities of daily living (OT) Flowsheets (Taken 06/09/2021 1228) LTG: Pt will maintain dynamic sitting balance during ADLs with: Supervision/Verbal cueing Goal: LTG Patient will maintain dynamic standing with ADLs (OT) Description: LTG:  Patient will maintain dynamic standing balance with assist during activities of daily living (OT)  Flowsheets (Taken 06/09/2021 1228) LTG: Pt will maintain dynamic standing balance during ADLs with: Supervision/Verbal cueing   Problem: Sit to Stand Goal: LTG:  Patient will perform sit to stand in prep for activites of daily living with assistance level (OT) Description: LTG:  Patient will perform sit to stand in prep for activites of daily living with assistance level (OT) Flowsheets (Taken 06/09/2021 1228) LTG: PT will perform sit to stand in prep for activites of daily living with assistance level: Supervision/Verbal cueing   Problem: RH Eating Goal: LTG Patient will perform eating w/assist, cues/equip (OT) Description: LTG: Patient will perform eating with assist, with/without cues using equipment (OT) Flowsheets (Taken 06/09/2021 1228) LTG: Pt will perform eating with assistance level of: Independent   Problem: RH Grooming Goal: LTG Patient will perform grooming w/assist,cues/equip (OT) Description: LTG: Patient will perform grooming with assist, with/without cues using equipment (OT) Flowsheets (Taken 06/09/2021 1228) LTG: Pt will perform grooming with assistance level of: Supervision/Verbal cueing   Problem: RH Bathing Goal: LTG Patient will bathe all body parts with assist levels (OT) Description: LTG: Patient will bathe all body parts with assist levels (OT) Flowsheets (Taken 06/09/2021 1228) LTG: Pt will perform bathing with assistance level/cueing: Supervision/Verbal cueing    Problem: RH Dressing Goal: LTG Patient will perform upper body dressing (OT) Description: LTG Patient will perform upper body dressing with assist, with/without cues (OT). Flowsheets (Taken 06/09/2021 1228) LTG: Pt will perform upper body dressing with assistance level of: Supervision/Verbal cueing Goal: LTG Patient will perform lower body dressing w/assist (OT) Description: LTG: Patient will perform lower body dressing with assist, with/without cues in positioning using equipment (OT) Flowsheets (Taken 06/09/2021 1228) LTG: Pt will perform lower body dressing with assistance level of: Supervision/Verbal cueing   Problem: RH Toileting Goal: LTG Patient will perform toileting task (3/3 steps) with assistance level (OT) Description: LTG: Patient will perform toileting task (3/3 steps) with assistance level (OT)  Flowsheets (Taken 06/09/2021 1228) LTG: Pt will perform toileting task (3/3 steps) with assistance level: Supervision/Verbal cueing   Problem: RH Functional Use of Upper Extremity Goal: LTG Patient will use RT/LT upper extremity as a (OT) Description: LTG: Patient will use right/left upper extremity as a stabilizer/gross assist/diminished/nondominant/dominant level with assist, with/without cues during functional activity (OT) Flowsheets (Taken 06/09/2021 1228) LTG: Use of upper extremity in functional activities: LUE as nondominant level LTG: Pt will use upper extremity in functional activity with assistance level of: Supervision/Verbal cueing   Problem: RH Toilet Transfers Goal: LTG Patient will perform toilet transfers w/assist (OT) Description: LTG: Patient will perform toilet transfers with assist, with/without cues using equipment (OT) Flowsheets (Taken 06/09/2021 1228) LTG: Pt will perform toilet transfers with assistance level of: Supervision/Verbal cueing   Problem: RH Tub/Shower Transfers Goal: LTG Patient will perform tub/shower transfers w/assist (OT) Description: LTG:  Patient will perform tub/shower transfers with assist, with/without cues using equipment (OT) Flowsheets (Taken 06/09/2021 1228) LTG: Pt will perform tub/shower stall transfers with assistance level of: Supervision/Verbal cueing

## 2021-06-09 NOTE — Patient Care Conference (Signed)
Inpatient RehabilitationTeam Conference and Plan of Care Update Date: 06/09/2021   Time: 10:21 AM    Patient Name: Michaela Rios      Medical Record Number: [id number]  Date of Birth: [birth year] Sex: Female         Room/Bed: 4W24C/4W24C-01 Payor Info: Payor: [employer] MEDICARE / Plan: [employer] MEDICARE HMO/PPO / Product Type: *No Product type* /    Admit Date/Time:  06/08/2021  5:45 PM  Primary Diagnosis:  Embolic stroke of right basal ganglia [facility])  Hospital Problems: Principal Problem:   Embolic stroke of right basal ganglia ([place]) Active Problems:   Thalamic stroke [facility])    Expected Discharge Date: Expected Discharge Date:  (evals pending ELOS 2-3 weeks)  Team Members Present: Physician leading conference: Dr. Alysia Penna Care Coodinator Present: Dorien Chihuahua, RN, BSN, CRRN;Christina [place], BSW Nurse Present: Dorien Chihuahua, RN PT Present: Barrie Folk, PT OT Present: Other (comment) Georgina Quint OT) SLP Present: Other (comment) Herbert Deaner, SLP) PPS Coordinator present : Gunnar Fusi, SLP     Current Status/Progress Goal Weekly Team Focus  Bowel/Bladder             Swallow/Nutrition/ Hydration   Eval pending  Eval pending  Eval pending   ADL's   eval pending  eval pending  eval pending   Mobility             Communication   eval pending  eval pending  eval pending   Safety/Cognition/ Behavioral Observations  Eval pending  Eval pending  Eval pending   Pain             Skin               Discharge Planning:  TBA   Team Discussion: Extensive work up for edema PTA; general stroke with hx of migraines. BP stable; hx of HTN.  CKD and HTN. Progress limited by initiation and motor planning issues and hand pain.  Patient on target to meet rehab goals: Evals pending. Anticipate supervision goals and ELOS 2-3 weeks.  *See Care Plan and progress notes for long and short-term goals.   Revisions to Treatment Plan:    Teaching Needs: Transfers,  toileting, medications, secondary stroke risk management, etc  Current Barriers to Discharge: Decreased caregiver support and Home enviroment access/layout  Possible Resolutions to Barriers: Family education with spouse     Medical Summary Current Status: urinary urgency and incont, elevated BP  Barriers to Discharge: Medical stability   Possible Resolutions to [employer] Focus: Work on continence, work on Lexicographer , will tighten control of BP   Continued Need for Acute Rehabilitation Level of Care: The patient requires daily medical management by a physician with specialized training in physical medicine and rehabilitation for the following reasons: Direction of a multidisciplinary physical rehabilitation program to maximize functional independence : Yes Medical management of patient stability for increased activity during participation in an intensive rehabilitation regime.: Yes Analysis of laboratory values and/or radiology reports with any subsequent need for medication adjustment and/or medical intervention. : Yes   I attest that I was present, lead the team conference, and concur with the assessment and plan of the team.   Dorien Chihuahua B 06/09/2021, 2:02 PM

## 2021-06-09 NOTE — Evaluation (Signed)
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) Active Problems:   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[place])   Past Medical History:  Past Medical History:  Diagnosis Date   Anemia    Anxiety    Arthritis    Cataract    Chronic migraine    follows with neuro for same   Clotting disorder (HCC)    clot in ovarian vein- hx   Esophagitis    GERD (gastroesophageal reflux disease)    Hypertension    IBS (irritable bowel syndrome)    Low back pain syndrome    MRSA (methicillin resistant Staphylococcus aureus) infection 04/17/12   left torso   Peptic ulcer disease 2003, 12/2012   Peripheral vascular disease ([place])    Vitamin D deficiency    Past Surgical History:  Past Surgical History:  Procedure Laterality Date   ABDOMINAL HYSTERECTOMY     BUBBLE STUDY  06/03/2021   Procedure: BUBBLE STUDY;  Surgeon: Donato Heinz, MD;  Location: [facility];  Service: Cardiovascular;;   IR ANGIO EXTERNAL CAROTID SEL EXT CAROTID UNI R MOD SED  06/01/2021   IR ANGIO INTRA EXTRACRAN SEL COM CAROTID INNOMINATE UNI L MOD SED  06/01/2021   IR ANGIO INTRA EXTRACRAN SEL INTERNAL CAROTID UNI R MOD SED  06/01/2021   IR ANGIO VERTEBRAL SEL VERTEBRAL UNI R MOD SED  06/01/2021   IR US GUIDE VASC ACCESS RIGHT  06/01/2021   LAPAROSCOPIC BILATERAL SALPINGO OOPHERECTOMY     LOOP RECORDER INSERTION N/A 06/08/2021   Procedure: LOOP RECORDER INSERTION;  Surgeon: Constance Haw, MD;   Location: [facility];  Service: Cardiovascular;  Laterality: N/A;   pud surgery w/duod sticture-plasty and vagotomy  2003   Dr. Marlou Starks   TEE WITHOUT CARDIOVERSION N/A 06/03/2021   Procedure: TRANSESOPHAGEAL ECHOCARDIOGRAM (TEE);  Surgeon: Donato Heinz, MD;  Location[facility] ENDOSCOPY;  Service: Cardiovascular;  Laterality: N/A;   [place]     as a child   TUBAL LIGATION  1984    Assessment & Plan Clinical Impression: Patient is a 70 y.o. year old female with history of HTN, anxiety/depression, CKD III, migranes, chronic LBP, frequent UTIs who was admitted on 05/24/21 after a fall with reports of weakness, urinary incontinence and poor po intake. She was febrile in ED with T 101, was hypokalemic- K-2.8 and was tachycardic. She was treated with IVF and started on gentamicin for SIRS. She reported right flank pain and infectious work up negative. CT head showed multifocal hypoattenuations and LP recommended by neurology.   EEG showed intermittent generalized slowing in left temporal region felt to be nonspecific etiology.  Patient with elevated d-dimer-->CTA chest negative for PE and incidental right nodule ->recs to repeat CT in a year and BLE dopplers negative for DVT.  MRI brain showed restricted diffusion in right basal ganglia corresponding to lenticulostriate territory and hyperintensity in left head of caudate, left thalamus and right medial thalamus with vasogenic type edema felt to be unusual pattern --question of subacute ischemia, tumor, infection/cerebritis or toxoplasmosis.  She was treated with 5 day course of antibiotics and developed fever once antibiotics d/c. ID consulted for input and recommended monitoring off antibiotics. Repeat blood cultures negative.  LP done showing elevated protein  with 3 WBC, culture negative--CSF oligoclonal bands/paraneoplastic panel pending. . Vasculitis work up showed patient to be ANA positive/DSDNA negative and she was  treated with 3 day course of IV steroids due to concerns of vasculitis.   Dr. Leonel Ramsay questioned lymphoma as cause of stroke as well as T2 changes and recommended EBV serology--showed evidence of prior infection as well as TEE and MR spectroscopy. She underwent radial cerebral angiogram by Dr. Deatra Ina on 07/05 which was negative for vasculitis, AVM, stenosis, dural AV fistua or other significant vascular abnormality. Post procedure developed large dorsal hand hematoma treated with TR band and dopplers were negative for pseudoaneurysm. Dr. Oneida Alar consulted and felt that no evidence of compartment syndrome noted.   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 TEE done 07/07 and was negative for endocarditis but showed large patent PFO. Dr. Leonie Man felt that repeat MRI with "punctate infarcts likely periprocedural complication of cerebral angiogram and clinically silent" and he doubted that PFO was cause of her stroke. Doubt lymphoma as CSF negative for abnormal cells and hyperintensities improving--Loop recorder recommended due to rule out occult A fib. Neurology recommends DAPT X 3 weeks followed by ASA alone and follow up MRI brain in a month on outpatient basis.  Her mentation has been improving but she continues  to have deficits in balance w/ posterior/right lateral lean, delayed processing affecting ability to carry out ADLs as well as weakness. CIR recommended due to functional decline.   Of note, has a large PFO, however due to ROPE score, 0% chance causing the stroke.   Denies pain currently- but has had 4 migraines since here- used ot take Imitrex- was told couldn't take anymore- also took Norco at home if Imitrex didn't work. LBM 6 days ago, but eating <25% of meals and sometimes nothing for meals- no appetite. Did have diarrhea last week x 2  days due to ABX. Also having bladder incontinence ever since stroke- cannot control it. Patient transferred to CIR on 06/08/2021 .    Patient currently requires mod with basic self-care skills secondary to muscle weakness, decreased cardiorespiratoy endurance, impaired timing and sequencing, decreased coordination, and decreased motor planning, decreased visual motor skills, decreased attention to left and decreased motor planning, decreased initiation, decreased attention, decreased awareness, decreased problem solving, decreased safety awareness, decreased memory, and delayed processing, and decreased sitting balance, decreased standing balance, decreased postural control, hemiplegia, and decreased balance strategies.  Prior to hospitalization, patient could complete BADL with independent .  Patient will benefit from skilled intervention to increase independence with basic self-care skills prior to discharge home with care partner.  Anticipate patient will require 24 hour supervision and follow up home health.  OT - End of Session Activity Tolerance: Tolerates 10 - 20 min activity with multiple rests Endurance Deficit: Yes Endurance Deficit Description: req increased time to complete ADL 2/2 fatigue OT Assessment Rehab Potential (ACUTE ONLY): Good OT Patient demonstrates impairments in the following area(s): Balance;Cognition;Endurance;Motor;Pain;Perception;Safety;Vision;Skin Integrity;Sensory OT Basic ADL's Functional Problem(s): Eating;Grooming;Bathing;Dressing;Toileting OT Transfers Functional Problem(s): Toilet;Tub/Shower OT Additional Impairment(s): Fuctional Use of Upper Extremity OT Plan OT Intensity: Minimum of 1-2 x/day, 45 to 90 minutes OT Frequency: 5 out  of 7 days OT Duration/Estimated Length of Stay: 2-2.5 weeks OT Treatment/Interventions: Balance/vestibular training;Disease mangement/prevention;Neuromuscular re-education;Self Care/advanced ADL retraining;Therapeutic  Exercise;Wheelchair propulsion/positioning;UE/LE Strength taining/ROM;Skin care/wound managment;Pain management;DME/adaptive equipment instruction;Cognitive remediation/compensation;Community reintegration;Functional electrical stimulation;Patient/family education;Visual/perceptual remediation/compensation;Therapeutic Activities;Psychosocial support;Functional mobility training;Discharge planning;UE/LE Coordination activities OT Self Feeding Anticipated Outcome(s): ind OT Basic Self-Care Anticipated Outcome(s): S OT Toileting Anticipated Outcome(s): S OT Bathroom Transfers Anticipated Outcome(s): S OT Recommendation Patient destination: Home Follow Up Recommendations: Home health OT Equipment Recommended: To be determined   OT Evaluation Precautions/Restrictions  Precautions Precautions: Fall Precaution Comments: mild L hemiparesis Restrictions Weight Bearing Restrictions: No General Chart Reviewed: Yes Family/Caregiver Present: No  Pain Pain Assessment Pain Scale: 0-10 Pain Score: 0-No pain Home Living/Prior Functioning Home Living Family/patient expects to be discharged to:: Private residence Living Arrangements: Spouse/significant other Vision Baseline Vision/History:  (contacts per pt) Wears Glasses: At all times Patient Visual Report: No change from baseline Vision Assessment?: Yes Eye Alignment: Within Functional Limits Ocular Range of Motion: Within Functional Limits Alignment/Gaze Preference: Within Defined Limits Tracking/Visual Pursuits: Decreased smoothness of vertical tracking;Decreased smoothness of horizontal tracking;Requires cues, head turns, or add eye shifts to track Saccades: Additional eye shifts occurred during testing;Additional head turns occurred during testing;Decreased speed of saccadic movement Convergence: Impaired - to be further tested in functional context Visual Fields: Impaired-to be further tested in functional context Additional Comments:  Pt able to locate ADL items on sink. Perception  Perception: Impaired Spatial Orientation: Difficulty locating arm rests/reaching arm rests of w/c when instructed. Praxis Praxis: Impaired Praxis Impairment Details: Initiation;Motor planning Cognition Overall Cognitive Status: Impaired/Different from baseline Arousal/Alertness: Awake/alert Orientation Level: Person;Place;Situation Person: Oriented Place: Oriented Situation: Oriented Year: 2022 Month: July Day of Week: Correct Memory: Impaired Memory Impairment: Decreased recall of new information;Decreased short term memory Immediate Memory Recall: Sock;Blue;Bed Memory Recall Sock: Not able to recall Memory Recall Blue: Not able to recall Memory Recall Bed: Without Cue Attention: Sustained Sustained Attention: Impaired Problem Solving: Impaired Safety/Judgment: Impaired Sensation Sensation Light Touch: Impaired Detail Peripheral sensation comments: impaired R digits baseline 2/2 shingles Light Touch Impaired Details: Impaired RUE Hot/Cold: Appears Intact Proprioception: Impaired Detail (mild L hemiparesis) Stereognosis: Appears Intact Coordination Gross Motor Movements are Fluid and Coordinated: No Fine Motor Movements are Fluid and Coordinated: No Coordination and Movement Description: mild L hemiparesis, posterior bias in standing; further impaired by poor motor planning Finger Nose Finger Test: unable to complete with R hand 2/2 acute pain in R wrist/pain, mild dysmetria on L with decreased speed Motor  Motor Motor: Hemiplegia;Other (comment) Motor - Skilled Clinical Observations: mild L hemiparesis, posterior bias in standing; further impaired by poor motor planning  Trunk/Postural Assessment  Cervical Assessment Cervical Assessment: Within Functional Limits Thoracic Assessment Thoracic Assessment: Exceptions to [facility] (mild kyphotic posture) Lumbar Assessment Lumbar Assessment: Exceptions to [facility] (posterior pelvic  tilt) Postural Control Postural Control: Deficits on evaluation  Balance Balance Balance Assessed: Yes Static Sitting Balance Static Sitting - Balance Support: Feet supported Static Sitting - Level of Assistance: 5: Stand by assistance Static Sitting - Comment/# of Minutes: CGA Dynamic Sitting Balance Dynamic Sitting - Balance Support: Feet supported Dynamic Sitting - Level of Assistance: 4: Min assist Dynamic Sitting - Balance Activities: Reaching across midline;Forward lean/weight shifting;Reaching for objects Static Standing Balance Static Standing - Balance Support: During functional activity;No upper extremity supported Static Standing - Level of Assistance: 3: Mod assist Dynamic Standing Balance Dynamic Standing - Balance Support: During functional activity;No upper extremity supported Dynamic Standing - Level of Assistance: 3: Mod assist Extremity/Trunk Assessment  RUE Assessment RUE Assessment: Exceptions to [facility] Active Range of Motion (AROM) Comments: 3/4 to full shoulder flexion General Strength Comments: weak grip strength 2/2 R wrist/hand pain, req A to open objects LUE Assessment LUE Assessment: Exceptions to [facility] Active Range of Motion (AROM) Comments: 3/4 to full shoulder flexion General Strength Comments: 4/5 in shoulder flexion LUE Body System: Neuro Brunstrum levels for arm and hand: Arm;Hand Brunstrum level for arm: Stage IV Movement is deviating from synergy Brunstrum level for hand: Stage V Independence from basic synergies;Stage IV Movements deviating from synergies  Care Tool Care Tool Self Care Eating   Eating Assist Level: Supervision/Verbal cueing    Oral Care    Oral Care Assist Level: Supervision/Verbal cueing    Bathing   Body parts bathed by patient: Right arm;Left arm;Chest;Face;Front perineal area;Abdomen;Right upper leg;Left upper leg Body parts bathed by helper: Buttocks;Right lower leg;Left lower leg   Assist Level: Minimal Assistance -  Patient > 75%    Upper Body Dressing(including orthotics)   What is the patient wearing?: Pull over shirt   Assist Level: Supervision/Verbal cueing    Lower Body Dressing (excluding footwear)   What is the patient wearing?: Pants;Underwear/pull up Assist for lower body dressing: Maximal Assistance - Patient 25 - 49%    Putting on/Taking off footwear   What is the patient wearing?: Non-skid slipper socks Assist for footwear: Maximal Assistance - Patient 25 - 49%       Care Tool Toileting Toileting activity   Assist for toileting: Moderate Assistance - Patient 50 - 74%     Care Tool Bed Mobility Roll left and right activity   Roll left and right assist level: Minimal Assistance - Patient > 75%    Sit to lying activity   Sit to lying assist level: Minimal Assistance - Patient > 75%    Lying to sitting edge of bed activity   Lying to sitting edge of bed assist level: Moderate Assistance - Patient 50 - 74%     Care Tool Transfers Sit to stand transfer   Sit to stand assist level: Moderate Assistance - Patient 50 - 74%    Chair/bed transfer   Chair/bed transfer assist level: Moderate Assistance - Patient 50 - 74%     Toilet transfer   Assist Level: Moderate Assistance - Patient 50 - 74%     Care Tool Cognition Expression of Ideas and Wants Expression of Ideas and Wants: Some difficulty - exhibits some difficulty with expressing needs and ideas (e.g, some words or finishing thoughts) or speech is not clear   Understanding Verbal and Non-Verbal Content Understanding Verbal and Non-Verbal Content: Usually understands - understands most conversations, but misses some part/intent of message. Requires cues at times to understand   Memory/Recall Ability *first 3 days only Memory/Recall Ability *first 3 days only: That he or she is in a hospital/hospital unit;Current season    Refer to Care Plan for Long Term Goals  SHORT TERM GOAL WEEK 1 OT Short Term Goal 1 (Week 1): Pt will  don pants mod A. OT Short Term Goal 2 (Week 1): Pt will complete STS with min A + LRAD in prep for standing ADL. OT Short Term Goal 3 (Week 1): Pt will complete toilet transfer min A.  Recommendations for other services: None    Skilled Therapeutic Intervention ADL ADL Eating: Set up;Supervision/safety Where Assessed-Eating: Wheelchair Grooming: Supervision/safety Where Assessed-Grooming: Sitting at sink Upper Body Bathing: Supervision/safety Where Assessed-Upper Body Bathing: Sitting at sink Lower Body  Bathing: Minimal assistance Where Assessed-Lower Body Bathing: Sitting at sink Upper Body Dressing: Supervision/safety Where Assessed-Upper Body Dressing: Sitting at sink Lower Body Dressing: Maximal assistance Where Assessed-Lower Body Dressing: Standing at sink Toileting: Maximal assistance Where Assessed-Toileting: Glass blower/designer: Moderate assistance Toilet Transfer Method: Stand pivot Toilet Transfer Equipment: Grab bars;Raised toilet seat;Bedside commode Tub/Shower Transfer: Not assessed Social research officer, government: Not assessed Mobility  Bed Mobility Bed Mobility: Supine to Sit Supine to Sit: Moderate Assistance - Patient 50-74% Transfers Sit to Stand: Moderate Assistance - Patient 50-74% Stand to Sit: Moderate Assistance - Patient 50-74%  Session Note: Pt received semi-reclined in bed, denies pain, agreeable to OT eval.  Reviewed role of CIR OT, evaluation process, ADL/func mobility retraining, goals for therapy, and safety plan. Evaluation completed as documented above with session focus on functional mobility, sinkside bathing, dressing, and toileting. Came to sitting EOB with mod A to progress BLE off bed and to lift trunk, max Vcs for motor planning. Maintains static balance with CGA and req B feet support to prevent posterior LOB. Mod stand-pivot throughout session fro balance 2/2 posterior bias in standing and max Vcs for technique. Doffed hospital gown with cues  to initiate, donned shirt with increased time and close S. Bathed UB with close S and Vcs for initiation and to avoid bandages. Donned brief/pants with max A to thread BLE and to pull over hips, mod A for STS. Stand-pivot with mod A to and from toilet, total A for LB clothing management, but pt able to complete pericare with S in sitting. Pt c/o "light headedness" post STS, but resolves somewhat with sitting. BP read at 125/89 (101). Communicated with NT to utilize stedy for now for toilet t/f, 2/2 pt req mod Vcs for technique for stand-pivot. Pt req to remain seated in w/c.   Pt left seated in w/c, with safety belt alarm engaged, call bell in reach, and all immediate needs met.    Discharge Criteria: Patient will be discharged from OT if patient refuses treatment 3 consecutive times without medical reason, if treatment goals not met, if there is a change in medical status, if patient makes no progress towards goals or if patient is discharged from hospital.  The above assessment, treatment plan, treatment alternatives and goals were discussed and mutually agreed upon: by patient  Volanda Napoleon MS, OTR/L  06/09/2021, 10:30 AM

## 2021-06-09 NOTE — Progress Notes (Signed)
[facility] Individual Statement of Services  Patient Name:  Michaela Rios  Date:  06/09/2021  Welcome to the [facility].  Our goal is to provide you with an individualized program based on your diagnosis and situation, designed to meet your specific needs.  With this comprehensive rehabilitation program, you will be expected to participate in at least 3 hours of rehabilitation therapies Monday-Friday, with modified therapy programming on the weekends.  Your rehabilitation program will include the following services:  Physical Therapy (PT), Occupational Therapy (OT), Speech Therapy (ST), 24 hour per day rehabilitation nursing, Therapeutic Recreaction (TR), Neuropsychology, Care Coordinator, Rehabilitation Medicine, Nutrition Services, Pharmacy Services, and Other  Weekly team conferences will be held on Wednesdays to discuss your progress.  Your Inpatient Rehabilitation Care Coordinator will talk with you frequently to get your input and to update you on team discussions.  Team conferences with you and your family in attendance may also be held.  Expected length of stay: 21-24 Days  Overall anticipated outcome: Supervision to Min A  Depending on your progress and recovery, your program may change. Your Inpatient Rehabilitation Care Coordinator will coordinate services and will keep you informed of any changes. Your Inpatient Rehabilitation Care Coordinator's name and contact numbers are listed  below.  The following services may also be recommended but are not provided by the [facility]:   [place] will be made to provide these services after discharge if needed.  Arrangements include referral to agencies that provide these services.  Your insurance has been verified to be:  [employer] Your primary doctor is:  Billey Gosling, MD  Pertinent  information will be shared with your doctor and your insurance company.  Inpatient Rehabilitation Care Coordinator:  Erlene Quan, Power or [phone]  Information discussed with and copy given to patient by: Dyanne Iha, 06/09/2021, 11:20 AM

## 2021-06-09 NOTE — Progress Notes (Signed)
Inpatient Rehabilitation  Patient information reviewed and entered into eRehab system by Shemiah Rosch Temari Schooler, OTR/L.   Information including medical coding, functional ability and quality indicators will be reviewed and updated through discharge.    

## 2021-06-09 NOTE — Plan of Care (Signed)
  Problem: RH Attention Goal: LTG Patient will demonstrate this level of attention during functional activites (SLP) Description: LTG:  Patient will will demonstrate this level of attention during functional activites (SLP) 06/09/2021 2143 by Charna Elizabeth T, CCC-SLP Note: Goal adjusted to increase complexity 06/09/2021 2141 by Charna Elizabeth T, CCC-SLP Flowsheets (Taken 06/09/2021 2141) Patient will demonstrate during cognitive/linguistic activities the attention type of: Selective Number of minutes patient will demonstrate attention during cognitive/linguistic activities: 35 06/09/2021 1645 by Charna Elizabeth T, CCC-SLP Flowsheets (Taken 06/09/2021 1645) Patient will demonstrate during cognitive/linguistic activities the attention type of: Sustained Patient will demonstrate this level of attention during cognitive/linguistic activities in: Controlled LTG: Patient will demonstrate this level of attention during cognitive/linguistic activities with assistance of (SLP): Minimal Assistance - Patient > 75% Number of minutes patient will demonstrate attention during cognitive/linguistic activities: 30

## 2021-06-09 NOTE — Progress Notes (Signed)
Patient ID: Michaela Rios, female   DOB: [birth year], 70 y.o.   MRN: [id number] Met with the patient to introduce self and the role of the nurse CM. Reviewed medical history and medications prior to the event. Discussed management of HTN, prediabetes and low protein level. Continue to follow along to discharge to address educational needs and questions so as to facilitate preparation for discharge. Reviewed rehab therapy schedule and plan of care. Margarito Liner, RN

## 2021-06-09 NOTE — Progress Notes (Signed)
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[facility])  Past Medical History:  Past Medical History:  Diagnosis Date   Anemia    Anxiety    Arthritis    Cataract    Chronic migraine    follows with neuro for same   Clotting disorder (HCC)    clot in ovarian vein- hx   Esophagitis    GERD (gastroesophageal reflux disease)    Hypertension    IBS (irritable bowel syndrome)    Low back pain syndrome    MRSA (methicillin resistant Staphylococcus aureus) infection 04/17/12   left torso   Peptic ulcer disease 2003, 12/2012   Peripheral vascular disease ([place])    Vitamin D deficiency    Past Surgical History:  Past Surgical History:  Procedure Laterality Date   ABDOMINAL HYSTERECTOMY     BUBBLE STUDY  06/03/2021   Procedure: BUBBLE STUDY;  Surgeon: Donato Heinz, MD;  Location: [place];  Service: Cardiovascular;;   IR ANGIO EXTERNAL CAROTID SEL EXT CAROTID UNI R MOD SED  06/01/2021   IR ANGIO INTRA EXTRACRAN SEL COM CAROTID INNOMINATE UNI L MOD SED  06/01/2021   IR ANGIO INTRA EXTRACRAN SEL INTERNAL CAROTID UNI R MOD SED  06/01/2021   IR ANGIO VERTEBRAL SEL VERTEBRAL UNI R MOD SED  06/01/2021   IR US GUIDE VASC ACCESS RIGHT  06/01/2021   LAPAROSCOPIC BILATERAL SALPINGO OOPHERECTOMY     LOOP RECORDER INSERTION N/A 06/08/2021   Procedure: LOOP RECORDER INSERTION;  Surgeon: Constance Haw, MD;  Location: [place] CV [facility];  Service: Cardiovascular;  Laterality: N/A;   pud surgery w/duod sticture-plasty and vagotomy  2003   Dr. Marlou Starks   TEE WITHOUT CARDIOVERSION N/A 06/03/2021   Procedure: TRANSESOPHAGEAL ECHOCARDIOGRAM (TEE);  Surgeon: Donato Heinz, MD;  Location: [place];  Service: Cardiovascular;  Laterality: N/A;    TONSILLECTOMY AND ADENOIDECTOMY     as a child   TUBAL LIGATION  1984   Social History:  reports that she has never smoked. She has never used smokeless tobacco. She reports current alcohol use. She reports that she does not use drugs.  Family / Support Systems Marital Status: Married Patient Roles: Spouse Spouse/Significant Other: Levada Dy Rafferty Anticipated Caregiver: Levada Dy Nourse Ability/Limitations of Caregiver: Min A, no toileting Caregiver Availability: 24/7 Family Dynamics: some support  Social History Preferred language: English Religion: Baptist Read: Yes Write: Yes Employment Status: Retired Public relations account executive Issues: n/a Guardian/Conservator: n/a   Abuse/Neglect Abuse/Neglect Assessment Can Be Completed: Yes Physical Abuse: Denies Verbal Abuse: Denies Sexual Abuse: Denies Exploitation of patient/patient's resources: Denies Self-Neglect: Denies  Emotional Status Recent Psychosocial Issues: hx of dperession and anxiety Psychiatric History: n/a Substance Abuse History: n/a  Patient / Family Perceptions, Expectations & Goals Pt/Family understanding of illness & functional limitations: yes Premorbid pt/family roles/activities: Pt active in the comminity, driving Anticipated changes in roles/activities/participation: spouse able to assist with roles and task Pt/family expectations/goals: Supervision to [employer]: None Premorbid Home Care/DME Agencies: Other (Comment) Lobbyist, Education officer, environmental) Transportation available at discharge: Family able to transport Resource referrals recommended: Neuropsychology (hx of dperession and anxiety)  Discharge Planning Living Arrangements: Spouse/significant other Support Systems: Spouse/significant other Type of Residence: Private residence (1 level home, 3 steps) Insurance Resources: Data processing manager  Insurance (specify) Scientist, clinical (histocompatibility and immunogenetics) Medicare) Financial Resources: SSD Financial Screen  Referred: No Living Expenses: Own Money Management: Patient, Spouse Does the patient have any problems obtaining your medications?: No Home Management: independent Patient/Family Preliminary Plans: spouse able to assist Care Coordinator Barriers to Discharge: Insurance for SNF coverage Care Coordinator Anticipated Follow Up Needs: HH/OP Expected length of stay: 21-24 Days  Clinical Impression SW met with patient, introduced self, explained role and process. Patient spouse will be herr this evening. No additional questions or concerns SW will follow up,  Dyanne Iha 06/09/2021, 1:34 PM

## 2021-06-09 NOTE — Evaluation (Signed)
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) Active Problems:   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[place])  Past Medical History:  Past Medical History:  Diagnosis Date   Anemia    Anxiety    Arthritis    Cataract    Chronic migraine    follows with neuro for same   Clotting disorder (HCC)    clot in ovarian vein- hx   Esophagitis    GERD (gastroesophageal reflux disease)    Hypertension    IBS (irritable bowel syndrome)    Low back pain syndrome    MRSA (methicillin resistant Staphylococcus aureus) infection 04/17/12   left torso   Peptic ulcer disease 2003, 12/2012   Peripheral vascular disease ([place])    Vitamin D deficiency    Past Surgical History:  Past Surgical History:  Procedure Laterality Date   ABDOMINAL HYSTERECTOMY     BUBBLE STUDY  06/03/2021   Procedure: BUBBLE STUDY;  Surgeon: Donato Heinz, MD;  Location: [place];  Service: Cardiovascular;;   IR ANGIO EXTERNAL CAROTID SEL EXT CAROTID UNI R MOD SED  06/01/2021   IR ANGIO INTRA EXTRACRAN SEL COM CAROTID INNOMINATE UNI L MOD SED  06/01/2021   IR ANGIO INTRA EXTRACRAN SEL INTERNAL CAROTID UNI R MOD SED  06/01/2021   IR ANGIO VERTEBRAL SEL VERTEBRAL UNI R MOD SED  06/01/2021   IR US GUIDE VASC ACCESS RIGHT  06/01/2021   LAPAROSCOPIC BILATERAL SALPINGO OOPHERECTOMY     LOOP RECORDER INSERTION N/A 06/08/2021   Procedure: LOOP RECORDER INSERTION;  Surgeon: Constance Haw, MD;  Location: [place] [facility];  Service: Cardiovascular;  Laterality: N/A;   pud surgery w/duod sticture-plasty and vagotomy  2003   Dr. Marlou Starks   TEE WITHOUT CARDIOVERSION N/A 06/03/2021   Procedure: TRANSESOPHAGEAL ECHOCARDIOGRAM (TEE);  Surgeon:  Donato Heinz, MD;  Location: [place];  Service: Cardiovascular;  Laterality: N/A;   TONSILLECTOMY AND ADENOIDECTOMY     as a child   TUBAL LIGATION  1984    Assessment / Plan / Recommendation Clinical Impression Michaela Rios is a 70 year old female with history of HTN, anxiety/depression, CKD III, migranes, chronic LBP, frequent UTIs who was admitted on 05/24/21 after a fall with reports of weakness, urinary incontinence and poor po intake. She was febrile in ED with T 101, was hypokalemic- K-2.8 and was tachycardic. EEG showed intermittent generalized slowing in left temporal region felt to be nonspecific etiology. MRI brain showed restricted diffusion in right basal ganglia corresponding to lenticulostriate territory and hyperintensity in left head of caudate, left thalamus and right medial thalamus with vasogenic type edema felt to be unusual pattern. 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 Neurology recommends DAPT X 3 weeks followed by ASA alone and follow up MRI brain in a month on outpatient basis.  Her mentation has been improving but she continues  to have deficits in balance w/ posterior/right lateral lean, delayed processing affecting ability to carry out ADLs as well as weakness. CIR recommended due to functional decline.  Patient presents with moderate cognitive deficits post CVA. [facility]  Mental Status (SLUMS) administered revealing score of 17/30 (score >27 considered normal based on patient's educational history). Areas of impairment include immediate and short-term recall, mental manipulation, attention, problem solving skills, and executive functions. Patient reports currently feeling "slow" in regards to her processing and benefited from extended processing time among all evaluation tasks. Patient exhibited  inattention to left visual field during clock drawing without awareness. When this was brought to her attention she stated "I feel like my peripheral vision is different in my left eye." Verbal expression, auditory comprehension, and motor speech skills appear grossly intact. Swallow function was evaluated 6/30 with f/u on 7/08 revealing swallow WFL and tolerance of regular diet and thin liquids with no f/u needed for swallowing. Pills taken whole with liquid. Patient would benefit from skilled SLP intervention to maximize cognitive function and functional independence prior to discharge. Anticipate patient will require 24hr supervision and would benefit from OP speech therapy services.    Skilled Therapeutic Interventions          Administered [facility] Mental Status evaluation (SLUMS), further informal cognitive-linguistic assessment, reviewed results with patient and discussed anticipated plan of care. All questions were addressed and patient agreeable to continuation of skilled ST services.   SLP Assessment  Patient will need skilled Speech Lanaguage Pathology Services during CIR admission    Recommendations  SLP Diet Recommendations: Age appropriate regular solids;Thin Medication Administration: Whole meds with liquid Supervision: Patient able to self feed Compensations: Slow rate;Small sips/bites;Minimize environmental distractions Postural Changes and/or Swallow Maneuvers: Seated upright 90 degrees Oral Care Recommendations: Oral care BID Patient destination: Home Follow up Recommendations: Outpatient SLP Equipment Recommended: None recommended by SLP    SLP Frequency 3 to 5 out of 7 days   SLP Duration  SLP Intensity  SLP Treatment/Interventions 21-24  Minumum of 1-2 x/day, 30 to 90 minutes  Cognitive remediation/compensation;Environmental controls;Internal/external aids;Cueing hierarchy;Functional tasks;Medication managment;Patient/family education;Therapeutic  Activities    Pain Pain Assessment Pain Scale: 0-10 Pain Score: 0-No pain  Prior Functioning Cognitive/Linguistic Baseline: Within functional limits Type of Home: House  Lives With: Spouse Available Help at Discharge: Family;Available 24 hours/day 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, systems Overall Cognitive Status: Impaired/Different from baseline Arousal/Alertness: Awake/alert Attention: Selective;Sustained Focused Attention: Appears intact Sustained Attention: Impaired Sustained Attention Impairment: Functional basic;Verbal basic Selective Attention: Impaired Selective Attention Impairment: Verbal basic;Functional basic Memory: Impaired Memory Impairment: Decreased recall of new information;Decreased short term memory Decreased Short Term Memory: Verbal basic;Functional basic Awareness: Impaired Awareness Impairment: Emergent impairment Problem Solving: Impaired Problem Solving Impairment: Verbal basic Executive Function: Organizing;Self Monitoring Organizing: Impaired Organizing Impairment: Verbal basic Self Monitoring: Impaired Self Monitoring Impairment: Verbal basic Safety/Judgment: Impaired  Comprehension Auditory Comprehension Overall Auditory Comprehension: Appears within functional limits for tasks assessed Expression Expression Primary Mode of Expression: Verbal Verbal Expression Overall Verbal Expression: Appears within functional limits for tasks assessed Oral Motor Oral Motor/Sensory Function Overall Oral Motor/Sensory Function: Within functional limits Motor Speech Overall Motor Speech: Appears within functional limits for tasks assessed Respiration: Within functional limits Phonation: Normal Resonance: Within functional limits Articulation: Within functional limitis Intelligibility: Intelligible Motor Planning: Witnin functional limits Motor Speech Errors: Not applicable  Care Tool Care Tool  Cognition Expression of Ideas and Wants Expression of Ideas and Wants: Some difficulty - exhibits some difficulty with expressing needs and ideas (e.g, some words or finishing thoughts) or speech is not clear   Understanding Verbal and Non-Verbal Content Understanding Verbal and Non-Verbal Content: Usually understands - understands most conversations,  but misses some part/intent of message. Requires cues at times to understand   Memory/Recall Ability *first 3 days only Memory/Recall Ability *first 3 days only: That he or she is in a hospital/hospital unit;Current season      Short Term Goals: Week 1: SLP Short Term Goal 1 (Week 1): Patient will demonstrate selective attention to functional tasks in a mildly-to-moderately distracting environment for 20 minutes with min A cues for re-direction SLP Short Term Goal 2 (Week 1): Patient will attend to left field of environment during functional tasks with mod A cues SLP Short Term Goal 3 (Week 1): Patient will demonstrate functional problem solving for basic and mildly complex tasks with mod A cues SLP Short Term Goal 4 (Week 1): Patient will demonstrate recent recall (15 minutes or greater) of novel information with mod A verbal cues with use of compensatory strategies SLP Short Term Goal 5 (Week 1): Patient will demonstrate improved awareness by identifying and repairing errors with mod A cues.  Refer to Care Plan for Long Term Goals  Recommendations for other services: None   Discharge Criteria: Patient will be discharged from SLP if patient refuses treatment 3 consecutive times without medical reason, if treatment goals not met, if there is a change in medical status, if patient makes no progress towards goals or if patient is discharged from hospital.  The above assessment, treatment plan, treatment alternatives and goals were discussed and mutually agreed upon: by patient  Michaela Rios 06/09/2021, 9:55 PM

## 2021-06-09 NOTE — Evaluation (Addendum)
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) Active Problems:   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[place])   Past Medical History:  Past Medical History:  Diagnosis Date   Anemia    Anxiety    Arthritis    Cataract    Chronic migraine    follows with neuro for same   Clotting disorder (HCC)    clot in ovarian vein- hx   Esophagitis    GERD (gastroesophageal reflux disease)    Hypertension    IBS (irritable bowel syndrome)    Low back pain syndrome    MRSA (methicillin resistant Staphylococcus aureus) infection 04/17/12   left torso   Peptic ulcer disease 2003, 12/2012   Peripheral vascular disease ([place])    Vitamin D deficiency    Past Surgical History:  Past Surgical History:  Procedure Laterality Date   ABDOMINAL HYSTERECTOMY     BUBBLE STUDY  06/03/2021   Procedure: BUBBLE STUDY;  Surgeon: Donato Heinz, MD;  Location: [facility];  Service: Cardiovascular;;   IR ANGIO EXTERNAL CAROTID SEL EXT CAROTID UNI R MOD SED  06/01/2021   IR ANGIO INTRA EXTRACRAN SEL COM CAROTID INNOMINATE UNI L MOD SED  06/01/2021   IR ANGIO INTRA EXTRACRAN SEL INTERNAL CAROTID UNI R MOD SED  06/01/2021   IR ANGIO VERTEBRAL SEL VERTEBRAL UNI R MOD SED  06/01/2021   IR US GUIDE VASC ACCESS RIGHT  06/01/2021   LAPAROSCOPIC BILATERAL SALPINGO OOPHERECTOMY     LOOP RECORDER INSERTION N/A 06/08/2021   Procedure: LOOP RECORDER INSERTION;  Surgeon: Constance Haw, MD;  Location: [facility];  Service: Cardiovascular;  Laterality: N/A;   pud surgery  w/duod sticture-plasty and vagotomy  2003   Dr. Marlou Starks   TEE WITHOUT CARDIOVERSION N/A 06/03/2021   Procedure: TRANSESOPHAGEAL ECHOCARDIOGRAM (TEE);  Surgeon: Donato Heinz, MD;  Location: [facility] ENDOSCOPY;  Service: Cardiovascular;  Laterality: N/A;   TONSILLECTOMY AND ADENOIDECTOMY     as a child   TUBAL LIGATION  1984    Assessment & Plan Clinical Impression:  Michaela Rios is a 70 year old female with history of HTN, anxiety/depression, CKD III, migranes, chronic LBP, frequent UTIs who was admitted on 05/24/21 after a fall with reports of weakness, urinary incontinence and poor po intake. She was febrile in ED with T 101, was hypokalemic- K-2.8 and was tachycardic. She was treated with IVF and started on gentamicin for SIRS. She reported right flank pain and infectious work up negative. CT head showed multifocal hypoattenuations and LP recommended by neurology.   EEG showed intermittent generalized slowing in left temporal region felt to be nonspecific etiology.  Patient with elevated d-dimer-->CTA chest negative for PE and incidental right nodule ->recs to repeat CT in a year and BLE dopplers negative for DVT.  MRI brain showed restricted diffusion in right basal ganglia corresponding to lenticulostriate territory and hyperintensity in left head of caudate, left thalamus and right medial thalamus with vasogenic type edema felt to be unusual pattern --question of subacute ischemia, tumor, infection/cerebritis or toxoplasmosis.  She underwent radial cerebral angiogram by [name]  [name] on 07/05 which was negative for vasculitis, AVM, stenosis, dural AV fistua or other significant vascular abnormality. Post procedure developed large dorsal hand hematoma treated with TR band and dopplers were negative for pseudoaneurysm. Dr. [name] consulted and felt that no evidence of compartment syndrome noted. Repeat MRI brain 07/06 showed numerous small acute bilateral cerebral and cerebellar  infarcts c/w emboli and expected evolution of right BG infarct with decrease in T2 signal abnormality. TEE done 07/07 and was negative for endocarditis but showed large patent PFO. Dr. Pearlean Brownie felt that repeat MRI with "punctate infarcts likely periprocedural complication of cerebral angiogram and clinically silent" and he doubted that PFO was cause of her stroke. Doubt lymphoma as CSF negative for abnormal cells and hyperintensities improving--Loop recorder recommended due to rule out occult A fib. Neurology recommends DAPT X 3 weeks followed by ASA alone and follow up MRI brain in a month on outpatient basis.  Her mentation has been improving but she continues  to have deficits in balance w/ posterior/right lateral lean, delayed processing affecting ability to carry out ADLs as well as weakness   Patient transferred to CIR on 06/08/2021 .   Patient currently requires mod with mobility secondary to muscle weakness, decreased cardiorespiratoy endurance, impaired timing and sequencing, decreased coordination, and decreased motor planning, decreased initiation and decreased safety awareness, and decreased sitting balance, decreased standing balance, decreased postural control, hemiplegia, and decreased balance strategies.  Prior to hospitalization, patient was independent  with mobility and lived with Spouse in a House home.  Home access is 3 from the front, 7 from the back Stairs to enter.  Patient will benefit from skilled PT intervention to maximize safe functional mobility, minimize fall risk, and decrease caregiver burden for planned discharge home with 24 hour supervision.  Anticipate patient will benefit from follow up OP at discharge.  PT - End of Session Activity Tolerance: Tolerates 30+ min activity with multiple rests Endurance Deficit: Yes Endurance Deficit Description: required increased time to complete functional mobility secondary to CVA and fatigue PT Assessment Rehab Potential (ACUTE/IP  ONLY): Good PT Barriers to Discharge: Home environment access/layout PT Patient demonstrates impairments in the following area(s): Balance;Behavior;Edema;Endurance;Motor;Pain;Safety;Sensory PT Transfers Functional Problem(s): Bed Mobility;Bed to Chair;Car;Furniture PT Locomotion Functional Problem(s): Ambulation;Wheelchair Mobility;Stairs PT Plan PT Intensity: Minimum of 1-2 x/day ,45 to 90 minutes PT Frequency: 5 out of 7 days PT Duration Estimated Length of Stay: 21-24 days PT Treatment/Interventions: Ambulation/gait training;Discharge planning;Functional mobility training;Therapeutic Activities;Psychosocial support;Visual/perceptual remediation/compensation;Balance/vestibular training;Disease management/prevention;Neuromuscular re-education;Skin care/wound management;Therapeutic Exercise;Wheelchair propulsion/positioning;DME/adaptive equipment instruction;Cognitive remediation/compensation;Pain management;Splinting/orthotics;UE/LE Strength taining/ROM;Community reintegration;Functional electrical stimulation;Patient/family education;Stair training;UE/LE Coordination activities PT Transfers Anticipated Outcome(s): Supervision PT Locomotion Anticipated Outcome(s): Supervision with LRAD PT Recommendation Follow Up Recommendations: Outpatient PT Patient destination: Home Equipment Recommended: To be determined   PT Evaluation Precautions/Restrictions Precautions Precautions: Fall Precaution Comments: mild L hemiparesis Restrictions Weight Bearing Restrictions: No General Chart Reviewed: Yes Family/Caregiver Present: No Vital SignsTherapy Vitals Pain Pain Assessment Pain Scale: 0-10 Pain Score: 0-No pain Home Living/Prior Functioning Home Living Available Help at Discharge: Family;Available 24 hours/day Type of Home: House Home Access: Stairs to enter [employer] of Steps: 3 from the front, 7 from the back Entrance Stairs-Rails: Right (no railing in the front, railing  on the right in the back) Home Layout: One level Bathroom Shower/Tub: Health visitor: Handicapped height Bathroom Accessibility: Yes Additional Comments: Husband retired and available for 24/7 assist  Lives With: Spouse Prior Function Level of Independence: Independent with basic ADLs;Independent with gait;Independent with transfers  Able to Take Stairs?: Yes Driving: Yes Vocation: Retired Leisure: Hobbies-yes (Comment) Comments: Independent without AD; enjoys reading Vision/Perception  Vision - Assessment Eye Alignment: Within Functional Limits Ocular Range of Motion: Within Functional Limits Alignment/Gaze Preference: Within Defined Limits Tracking/Visual Pursuits: Decreased smoothness of vertical tracking;Decreased smoothness of horizontal tracking;Requires cues, head turns, or add eye shifts to track Saccades: Additional eye shifts occurred during testing;Additional head turns occurred during testing;Decreased speed of saccadic movement Convergence: Impaired - to be further tested in functional context Additional Comments: Pt able to locate ADL items on sink. Perception Perception: Impaired Spatial Orientation: Difficulty locating arm rests/reaching arm rests of w/c when instructed. Praxis Praxis: Impaired Praxis Impairment Details: Initiation;Motor planning Praxis-Other Comments: Increased time for movements, increased cuing for initiation and motor planning  Cognition Overall Cognitive Status: Impaired/Different from baseline Arousal/Alertness: Awake/alert Orientation Level: Oriented X4 Attention: Sustained Sustained Attention: Impaired Safety/Judgment: Impaired Sensation Sensation Light Touch: Impaired Detail Peripheral sensation comments: impaired R digits baseline 2/2 shingles; appears intact with RLE Light Touch Impaired Details: Impaired RUE Hot/Cold: Appears Intact Proprioception: Impaired Detail (mild R hemiparesis) Stereognosis: Appears  Intact Coordination Gross Motor Movements are Fluid and Coordinated: No Fine Motor Movements are Fluid and Coordinated: No Coordination and Movement Description: mild R hemiparesis, posterior bias in standing; further impaired by poor motor planning Finger Nose Finger Test: mild dysmetria with decreased speed bilaterally; R>L Heel Shin Test: Decreased speed biltaterally; R>L Motor  Motor Motor: Hemiplegia;Other (comment) Motor - Skilled Clinical Observations: mild R hemiparesis, posterior bias in standing; further impaired by poor motor planning   Trunk/Postural Assessment  Cervical Assessment Cervical Assessment: Within Functional Limits Thoracic Assessment Thoracic Assessment: Within Functional Limits (Mild rounded shoulders) Lumbar Assessment Lumbar Assessment: Exceptions to [facility] (posterior pelvic tilt) Postural Control Postural Control: Deficits on evaluation  Balance Balance Balance Assessed: Yes Static Sitting Balance Static Sitting - Balance Support: Feet supported Static Sitting - Level of Assistance: 5: Stand by assistance Static Standing Balance Static Standing - Balance Support: During functional activity; Bilateral upper extremity supported Static Standing - Level of Assistance: 3: Mod assist Dynamic Standing Balance Dynamic Standing - Balance Support: During functional activity;Bilateral upper extremity supported Dynamic Standing - Level of Assistance: 3: Mod assist Extremity Assessment  RLE Assessment RLE Assessment: Exceptions to [facility] RLE Strength Right Hip Flexion: 4-/5 Right Hip ABduction: 4+/5 Right Hip ADduction: 4+/5 Right Knee Extension: 4/5 Right Ankle Dorsiflexion: 3+/5 LLE Assessment General Strength Comments: 4+/5 globally  Care Tool Care Tool Bed Mobility Roll left and right activity   Roll left and right assist level: Minimal Assistance - Patient > 75%    Sit to lying activity   Sit to lying assist level: Moderate Assistance - Patient 50 -  74%    Lying to sitting edge of bed activity   Lying to sitting edge of bed assist level: Moderate Assistance - Patient 50 - 74%     Care Tool Transfers Sit to stand transfer   Sit to stand assist level: Moderate Assistance - Patient 50 - 74%    Chair/bed transfer   Chair/bed transfer assist level: Moderate Assistance - Patient 50 - 74%     Toilet transfer    N/A    Geneticist, molecular transfer assist level: Moderate Assistance - Patient 50 - 74%      Care Tool Locomotion Ambulation   Assist level: Moderate Assistance - Patient 50 - 74% Assistive device: Walker-rolling Max distance: 44ft  Walk 10 feet activity   Assist level: Moderate  Assistance - Patient - 50 - 74% Assistive device: Walker-rolling   Walk 50 feet with 2 turns activity Walk 50 feet with 2 turns activity did not occur: Safety/medical concerns      Walk 150 feet activity Walk 150 feet activity did not occur: Safety/medical concerns      Walk 10 feet on uneven surfaces activity Walk 10 feet on uneven surfaces activity did not occur: Safety/medical concerns      Stairs   Assist level: Moderate Assistance - Patient - 50 - 74% Stairs assistive device: 2 hand rails Max number of stairs: 4  Walk up/down 1 step activity   Walk up/down 1 step (curb) assist level: Moderate Assistance - Patient - 50 - 74% Walk up/down 1 step or curb assistive device: 2 hand rails    Walk up/down 4 steps activity Walk up/down 4 steps assist level: Moderate Assistance - Patient - 50 - 74% Walk up/down 4 steps assistive device: 2 hand rails  Walk up/down 12 steps activity Walk up/down 12 steps activity did not occur: Safety/medical concerns      Pick up small objects from floor Pick up small object from the floor (from standing position) activity did not occur: Safety/medical concerns      Wheelchair     Wheelchair activity did not occur: N/A      Wheel 50 feet with 2 turns activity Wheelchair 50 feet with 2 turns activity  did not occur: N/A    Wheel 150 feet activity Wheelchair 150 feet activity did not occur: N/A      Refer to Care Plan for Long Term Goals  SHORT TERM GOAL WEEK 1 PT Short Term Goal 1 (Week 1): Pt will complete bed mobility MinA PT Short Term Goal 2 (Week 1): Pt will complete bed<>chair transfer MinA PT Short Term Goal 3 (Week 1): Pt will ambulate at least 64ft ModA to progression towards ambulation LTG PT Short Term Goal 4 (Week 1): Pt will complete at least 4 stairs MinA PT Short Term Goal 5 (Week 1): Pt will consistently complete sit to stands with MinA  Recommendations for other services: None   Skilled Therapeutic Intervention Evaluation completed (see details above and below) with education on PT POC and goals and individual treatment initiated with focus on bed mobility, balance, transfers, and ambulation. Pt received sitting in wheelchair and agreeable to physical therapy evaluation. Gait belt placed prior to initiation of mobility. In addition to above evaluation, TUG was performed with a time of 2 minutes and 28 seconds. (A score of >13.5 seconds indicates patient is at a high fall risk. Pt educated on interpretation of their score). Pt demonstrated overall decreased gait velocity, freezing/shuffle stepping during turns, and increased time to complete turns. Verbal and tactile cuing given during turns to increased step length of the outside foot in order to make a more smooth transition through turn. Pt demonstrated increased time to complete stand>sit.  Pt supine in bed with HOB elevated, call bell within reach, and bed alarm on at the end of session. No further needs at this time.   Mobility Bed Mobility Bed Mobility: Sit to Supine;Scooting to HOB Supine to Sit: Moderate Assistance - Patient 50-74% Sit to Supine: Moderate Assistance - Patient 50-74% Scooting to [facility]: 2 Helpers;Other (comment) (Pt able to assist by pushing through BLE and chin tuck. Therapist on either  side) Transfers Transfers: Sit to Stand;Stand to Sit;Squat Pivot Transfers Sit to Stand: Moderate Assistance - Patient 50-74% (Increased time to complete,  posterior bias) Stand to Sit: Moderate Assistance - Patient 50-74% (Increased time to complete) Stand Pivot Transfers: Moderate Assistance - Patient 50 - 74% Stand Pivot Transfer Details: Tactile cues for initiation;Verbal cues for technique;Verbal cues for precautions/safety;Verbal cues for sequencing;Tactile cues for sequencing;Tactile cues for weight shifting Squat Pivot Transfers: Minimal Assistance - Patient > 75% Transfer (Assistive device): Rolling walker (with stand pivots) Locomotion  Gait Ambulation: Yes Gait Assistance: Moderate Assistance - Patient 50-74% Gait Distance (Feet): 35 Feet (10 ft + 1ft) Assistive device: Rolling walker Gait Assistance Details: Tactile cues for initiation;Verbal cues for sequencing;Verbal cues for technique;Verbal cues for precautions/safety;Verbal cues for gait pattern;Manual facilitation for weight shifting;Tactile cues for weight shifting Gait Assistance Details: Manual facilitation of lateral weight shifting/continuous forward movement of RW/anterior weight shifting to prevent posterior LOB, verbal cues for posture/increased speed/sequencing Gait Gait: Yes (Decreased gait velocity, increased stance time, shuffle steps/increased cadence with turns) Gait Pattern: Impaired Gait Pattern: Step-through pattern Gait velocity: decreased Stairs / Additional Locomotion Stairs: Yes Stairs Assistance: Moderate Assistance - Patient 50 - 74% Stair Management Technique: Two rails Number of Stairs: 4 Height of Stairs: 6 Wheelchair Mobility Wheelchair Mobility: No   Discharge Criteria: Patient will be discharged from PT if patient refuses treatment 3 consecutive times without medical reason, if treatment goals not met, if there is a change in medical status, if patient makes no progress towards goals or  if patient is discharged from hospital.  The above assessment, treatment plan, treatment alternatives and goals were discussed and mutually agreed upon: by patient  Henrene Pastor, SPT 06/09/2021, 3:53 PM

## 2021-06-09 NOTE — Progress Notes (Signed)
PROGRESS NOTE   Subjective/Complaints:  No issues overnite  Discussed HA, was taking topirimate PTA No longer allowed triptans  ROS- neg CP, SOB, N/V/D  Objective:   EP PPM/ICD IMPLANT  Result Date: 06/08/2021 SURGEON:  Allegra Lai, MD   PREPROCEDURE DIAGNOSIS:  Cryptogenic Stroke   POSTPROCEDURE DIAGNOSIS:  Cryptogenic Stroke    PROCEDURES:  1. Implantable loop recorder implantation   INTRODUCTION:  Michaela Rios is a 70 y.o. female with a history of unexplained stroke who presents today for implantable loop implantation.  The patient has had a cryptogenic stroke.  Despite an extensive workup by neurology, no reversible causes have been identified.  she has worn telemetry during which she did not have arrhythmias.  There is significant concern for possible atrial fibrillation as the cause for the patients stroke.  The patient therefore presents today for implantable loop implantation.   DESCRIPTION OF PROCEDURE:  Informed written consent was obtained, and the patient was brought to the electrophysiology lab in a fasting state.  The patient required no sedation for the procedure today.  Mapping over the patient's chest was performed by the EP lab staff to identify the area where electrograms were most prominent for ILR recording.  This area was found to be the left parasternal region over the 3rd-4th intercostal space. The patients left chest was therefore prepped and draped in the usual sterile fashion by the EP lab staff. The skin overlying the left parasternal region was infiltrated with lidocaine for local analgesia.  A 0.5-cm incision was made over the left parasternal region over the 3rd intercostal space.  A subcutaneous ILR pocket was fashioned using a combination of sharp and blunt dissection.  A Medtronic Reveal Linq model [place] SN [id number] implantable loop recorder was then placed into the pocket  R waves were very prominent and  measured 0.15mV. EBL<1 ml.  Steri- Strips and a sterile dressing were then applied.  There were no early apparent complications.   CONCLUSIONS:  1. Successful implantation of a Medtronic Reveal LINQ implantable loop recorder for cryptogenic stroke  2. No early apparent complications.   Recent Labs    06/07/21 0140 06/09/21 0558  WBC 10.4 5.5  HGB 9.9* 9.9*  HCT 30.5* 30.2*  PLT 405* 340   Recent Labs    06/07/21 0140 06/09/21 0558  NA 133* 137  K 3.8 3.5  CL 105 110  CO2 20* 20*  GLUCOSE 90 86  BUN 16 10  CREATININE 0.76 0.73  CALCIUM 8.7* 8.8*    Intake/Output Summary (Last 24 hours) at 06/09/2021 1127 Last data filed at 06/09/2021 0700 Gross per 24 hour  Intake 0 ml  Output --  Net 0 ml        Physical Exam: Vital Signs Blood pressure 137/83, pulse 79, temperature 98.4 F (36.9 C), temperature source Oral, resp. rate 14, height 5\' 3"  (1.6 m), weight 56.1 kg, SpO2 100 %.   General: No acute distress Mood and affect are appropriate Heart: Regular rate and rhythm no rubs murmurs or extra sounds Lungs: Clear to auscultation, breathing unlabored, no rales or wheezes Abdomen: Positive bowel sounds, soft nontender to palpation, nondistended Extremities: No clubbing, cyanosis,  or edema Skin: ecchymosis and swelling but no open areas dorsum of RIght hand Neurologic: Cranial nerves II through XII intact, motor strength is 5/5 in bilateral deltoid, bicep, tricep, grip, hip flexor, knee extensors, ankle dorsiflexor and plantar flexor Sensory exam normal sensation to light touch and proprioception in bilateral upper and lower extremities Cerebellar exam normal finger to nose to finger as well as heel to shin in bilateral upper and lower extremities Musculoskeletal: Full range of motion in all 4 extremities. No joint swelling   Assessment/Plan: 1. Functional deficits which require 3+ hours per day of interdisciplinary therapy in a comprehensive inpatient rehab  setting. Physiatrist is providing close team supervision and 24 hour management of active medical problems listed below. Physiatrist and rehab team continue to assess barriers to discharge/monitor patient progress toward functional and medical goals  Care Tool:  Bathing    Body parts bathed by patient: Right arm, Left arm, Chest, Face, Front perineal area, Abdomen, Right upper leg, Left upper leg   Body parts bathed by helper: Buttocks, Right lower leg, Left lower leg     Bathing assist Assist Level: Minimal Assistance - Patient > 75%     Upper Body Dressing/Undressing Upper body dressing   What is the patient wearing?: Pull over shirt    Upper body assist Assist Level: Supervision/Verbal cueing    Lower Body Dressing/Undressing Lower body dressing      What is the patient wearing?: Pants, Underwear/pull up     Lower body assist Assist for lower body dressing: Maximal Assistance - Patient 25 - 49%     Toileting Toileting    Toileting assist Assist for toileting: Moderate Assistance - Patient 50 - 74%     Transfers Chair/bed transfer  Transfers assist     Chair/bed transfer assist level: Moderate Assistance - Patient 50 - 74%     Locomotion Ambulation   Ambulation assist              Walk 10 feet activity   Assist           Walk 50 feet activity   Assist           Walk 150 feet activity   Assist           Walk 10 feet on uneven surface  activity   Assist           Wheelchair     Assist               Wheelchair 50 feet with 2 turns activity    Assist            Wheelchair 150 feet activity     Assist          Blood pressure 137/83, pulse 79, temperature 98.4 F (36.9 C), temperature source Oral, resp. rate 14, height 5\' 3"  (1.6 m), weight 56.1 kg, SpO2 100 %.   Medical Problem List and Plan: 1.  B/L weakness secondary to R BG and B/L thalamic infarcts             -patient may   shower             -ELOS/Goals: 21-24 days- supervision to min A 2.  Antithrombotics: -DVT/anticoagulation:  Pharmaceutical: Lovenox             -antiplatelet therapy: DAPT X 3 weeks followed by ASA alone.  3. Pain Management: Tylenol prn. Has migraines- cannot take Imitrex- suggest Nurtec prn for migraines 4. Mood:  LCSW to follow for evaluation and support.              -antipsychotic agents: N/A 5. Neuropsych: This patient is not  capable of making decisions on his own behalf. 6. Skin/Wound Care: Routine pressure relief measures.  7. Fluids/Electrolytes/Nutrition: Monitor I/O. Check lytes in am.  BMET nl 8. HTN: Monitor BP tid-- Vitals:   06/08/21 1954 06/09/21 0429  BP: (!) 153/99 137/83  Pulse: 90 79  Resp: 14 14  Temp: 98.7 F (37.1 C) 98.4 F (36.9 C)  SpO2: 100% 100%   Controlled this am cont to  monitor  9. CKD III: Has been incontinent since admission. Will monitor voiding with PVR checkss.             --set toileting schedule. 10. H/o depression/anxiety: Managed with Effexor, Wellbutrin and lorazepam prn. 11. H/o Migraines: controlled on Topamax.  Has had 4x since admission- strongly suggest Nurtec prn; continue Topamax.  12. RML nodule: Recommend repeat CT in 12 months. 13. Hyponatremia: Continue to monitor for now 14. Acute on chronic anemia of chronic disease:  Hgb 11.6 at admission-->9.9 15. FUO: WBC trending up but likely due to steroids.             --Continue to monitor for fevers or other signs of infection. 16. Poor appetite- will monitor- might need appetite stimulant 17. Constipation vs poor appetite- LBM 6 days ago- suggest KUB.     LOS: 1 days A FACE TO FACE EVALUATION WAS PERFORMED  Charlett Blake 06/09/2021, 11:27 AM

## 2021-06-10 MED ORDER — TOPIRAMATE 25 MG PO TABS
100.0000 mg | ORAL_TABLET | Freq: Two times a day (BID) | ORAL | Status: DC
  Administered 2021-06-10 – 2021-07-01 (×42): 100 mg via ORAL
  Filled 2021-06-10 (×43): qty 4

## 2021-06-10 NOTE — Progress Notes (Signed)
PROGRESS NOTE   Subjective/Complaints:  Still having HA usually around 3pm   ROS- neg CP, SOB, N/V/D  Objective:   EP PPM/ICD IMPLANT  Result Date: 06/08/2021 SURGEON:  Allegra Lai, MD   PREPROCEDURE DIAGNOSIS:  Cryptogenic Stroke   POSTPROCEDURE DIAGNOSIS:  Cryptogenic Stroke    PROCEDURES:  1. Implantable loop recorder implantation   INTRODUCTION:  Michaela Rios is a 70 y.o. female with a history of unexplained stroke who presents today for implantable loop implantation.  The patient has had a cryptogenic stroke.  Despite an extensive workup by neurology, no reversible causes have been identified.  she has worn telemetry during which she did not have arrhythmias.  There is significant concern for possible atrial fibrillation as the cause for the patients stroke.  The patient therefore presents today for implantable loop implantation.   DESCRIPTION OF PROCEDURE:  Informed written consent was obtained, and the patient was brought to the electrophysiology lab in a fasting state.  The patient required no sedation for the procedure today.  Mapping over the patient's chest was performed by the EP lab staff to identify the area where electrograms were most prominent for ILR recording.  This area was found to be the left parasternal region over the 3rd-4th intercostal space. The patients left chest was therefore prepped and draped in the usual sterile fashion by the EP lab staff. The skin overlying the left parasternal region was infiltrated with lidocaine for local analgesia.  A 0.5-cm incision was made over the left parasternal region over the 3rd intercostal space.  A subcutaneous ILR pocket was fashioned using a combination of sharp and blunt dissection.  A Medtronic Reveal Linq model [place] SN [id number] implantable loop recorder was then placed into the pocket  R waves were very prominent and measured 0.76mV. EBL<1 ml.  Steri- Strips and a  sterile dressing were then applied.  There were no early apparent complications.   CONCLUSIONS:  1. Successful implantation of a Medtronic Reveal LINQ implantable loop recorder for cryptogenic stroke  2. No early apparent complications.   Recent Labs    06/09/21 0558  WBC 5.5  HGB 9.9*  HCT 30.2*  PLT 340    Recent Labs    06/09/21 0558  NA 137  K 3.5  CL 110  CO2 20*  GLUCOSE 86  BUN 10  CREATININE 0.73  CALCIUM 8.8*     Intake/Output Summary (Last 24 hours) at 06/10/2021 0838 Last data filed at 06/10/2021 0750 Gross per 24 hour  Intake 400 ml  Output --  Net 400 ml         Physical Exam: Vital Signs Blood pressure 134/85, pulse 99, temperature 98.9 F (37.2 C), temperature source Oral, resp. rate 14, height 5\' 3"  (1.6 m), weight 56.1 kg, SpO2 100 %.   General: No acute distress Mood and affect are appropriate Heart: Regular rate and rhythm no rubs murmurs or extra sounds Lungs: Clear to auscultation, breathing unlabored, no rales or wheezes Abdomen: Positive bowel sounds, soft nontender to palpation, nondistended Extremities: No clubbing, cyanosis, or edema Skin: ecchymosis and swelling but no open areas dorsum of RIght hand Neurologic: Cranial nerves II through XII  intact, motor strength is 5/5 in bilateral deltoid, bicep, tricep, grip, hip flexor, knee extensors, ankle dorsiflexor and plantar flexor Sensory exam normal sensation to light touch and proprioception in bilateral upper and lower extremities Cerebellar exam normal finger to nose to finger as well as heel to shin in bilateral upper and lower extremities Musculoskeletal: Full range of motion in all 4 extremities. No joint swelling   Assessment/Plan: 1. Functional deficits which require 3+ hours per day of interdisciplinary therapy in a comprehensive inpatient rehab setting. Physiatrist is providing close team supervision and 24 hour management of active medical problems listed below. Physiatrist and  rehab team continue to assess barriers to discharge/monitor patient progress toward functional and medical goals  Care Tool:  Bathing    Body parts bathed by patient: Right arm, Left arm, Chest, Face, Front perineal area, Abdomen, Right upper leg, Left upper leg   Body parts bathed by helper: Buttocks, Right lower leg, Left lower leg     Bathing assist Assist Level: Minimal Assistance - Patient > 75%     Upper Body Dressing/Undressing Upper body dressing   What is the patient wearing?: Pull over shirt    Upper body assist Assist Level: Supervision/Verbal cueing    Lower Body Dressing/Undressing Lower body dressing      What is the patient wearing?: Underwear/pull up, Incontinence brief     Lower body assist Assist for lower body dressing: Maximal Assistance - Patient 25 - 49%     Toileting Toileting    Toileting assist Assist for toileting: Moderate Assistance - Patient 50 - 74%     Transfers Chair/bed transfer  Transfers assist     Chair/bed transfer assist level: Moderate Assistance - Patient 50 - 74%     Locomotion Ambulation   Ambulation assist      Assist level: Moderate Assistance - Patient 50 - 74% Assistive device: Walker-rolling Max distance: 20ft   Walk 10 feet activity   Assist     Assist level: Moderate Assistance - Patient - 50 - 74% Assistive device: Walker-rolling   Walk 50 feet activity   Assist Walk 50 feet with 2 turns activity did not occur: Safety/medical concerns         Walk 150 feet activity   Assist Walk 150 feet activity did not occur: Safety/medical concerns         Walk 10 feet on uneven surface  activity   Assist Walk 10 feet on uneven surfaces activity did not occur: Safety/medical concerns         Wheelchair     Assist     Wheelchair activity did not occur: N/A         Wheelchair 50 feet with 2 turns activity    Assist    Wheelchair 50 feet with 2 turns activity did not  occur: N/A       Wheelchair 150 feet activity     Assist  Wheelchair 150 feet activity did not occur: N/A       Blood pressure 134/85, pulse 99, temperature 98.9 F (37.2 C), temperature source Oral, resp. rate 14, height 5\' 3"  (1.6 m), weight 56.1 kg, SpO2 100 %.   Medical Problem List and Plan: 1.  B/L weakness secondary to R BG and B/L thalamic infarcts             -patient may  shower             -ELOS/Goals: 21-24 days- supervision to min A 2.  Antithrombotics: -DVT/anticoagulation:  Pharmaceutical: Lovenox             -antiplatelet therapy: DAPT X 3 weeks followed by ASA alone.  3. Pain Management: Tylenol prn. Has migraines- cannot take Imitrex- suggest Nurtec prn for migraines 4. Mood: LCSW to follow for evaluation and support.              -antipsychotic agents: N/A 5. Neuropsych: This patient is not  capable of making decisions on his own behalf. 6. Skin/Wound Care: Routine pressure relief measures.  7. Fluids/Electrolytes/Nutrition: Monitor I/O. Check lytes in am.  BMET nl 8. HTN: Monitor BP tid-- Vitals:   06/09/21 2026 06/10/21 0500  BP: (!) 153/93 134/85  Pulse: 95 99  Resp: 14 14  Temp: 98.6 F (37 C) 98.9 F (37.2 C)  SpO2: 99% 100%   Controlled this am cont to  monitor  9. CKD III: Has been incontinent since admission. Will monitor voiding with PVR checkss.             --set toileting schedule. 10. H/o depression/anxiety: Managed with Effexor, Wellbutrin and lorazepam prn. 11. H/o Migraines: controlled on Topamax.  Has had 4x since admission- may use Nurtec prn since this is not a vasoconstrictor; increase Topamax to 100mg  BID.  12. RML nodule: Recommend repeat CT in 12 months. 13. Hyponatremia: Continue to monitor for now 14. Acute on chronic anemia of chronic disease:  Hgb 11.6 at admission-->9.9 15. FUO: WBC trending up but likely due to steroids.             --Continue to monitor for fevers or other signs of infection. 16. Poor appetite- will  monitor- might need appetite stimulant 17. Constipation vs poor appetite- LBM 6 days ago- suggest KUB.     LOS: 2 days A FACE TO FACE EVALUATION WAS PERFORMED  Charlett Blake 06/10/2021, 8:38 AM

## 2021-06-10 NOTE — Progress Notes (Signed)
Occupational Therapy Session Note  Patient Details  Name: Michaela Rios MRN: [id number] Date of Birth: 24-Nov-1951  Today's Date: 06/10/2021 OT Individual Time: 3716-9678 OT Individual Time Calculation (min): 56 min    Short Term Goals: Week 1:  OT Short Term Goal 1 (Week 1): Pt will don pants mod A. OT Short Term Goal 2 (Week 1): Pt will complete STS with min A + LRAD in prep for standing ADL. OT Short Term Goal 3 (Week 1): Pt will complete toilet transfer min A.  Skilled Therapeutic Interventions/Progress Updates:    Pt received semi-reclined in bed, denies pain but endorses fatigue, agreeable to therapy. Session focus on self-care retraining, activity tolerance, func mobility in prep for improved ADL/IADL/func mobility performance + decreased caregiver burden. Came to sitting EOB close S and min Vcs for motor planning. Stand-pivot with min A to w/c. Completed UBD + UBB + grooming seated at sink with close S and extended time, easily distracted with conversation. Completed LBB with min A for STS and thoroughness of pericare cleansing. Donned new brief and pants with mod A to thread LLE and to pull over hips in standing, min A for STS and maintains standing with CGA with BUE support on sink. Req mod Vcs for correcting posterior bias in standing. MD present for assessment. Doffed and donned B socks with min A to don L sock.  Pt left seated in w/c with safety belt alarm engaged, call bell in reach, and all immediate needs met.    Therapy Documentation Precautions:  Precautions Precautions: Fall Precaution Comments: mild L hemiparesis Restrictions Weight Bearing Restrictions: No  Pain:  Denies but c/o R hand tenderness ADL: See Care Tool for more details.   Therapy/Group: Individual Therapy  Volanda Napoleon MS, OTR/L  06/10/2021, 6:48 AM

## 2021-06-10 NOTE — Progress Notes (Signed)
Physical Therapy Session Note  Patient Details  Name: Michaela Rios MRN: [id number] Date of Birth: 1951/09/10  Today's Date: 06/10/2021 PT Individual Time: 8333-8329 PT Individual Time Calculation (min): 91 min   Short Term Goals: Week 1:  PT Short Term Goal 1 (Week 1): Pt will complete bed mobility MinA PT Short Term Goal 2 (Week 1): Pt will complete bed<>chair transfer MinA PT Short Term Goal 3 (Week 1): Pt will ambulate at least 52ft ModA to progression towards ambulation LTG PT Short Term Goal 4 (Week 1): Pt will complete at least 4 stairs MinA PT Short Term Goal 5 (Week 1): Pt will consistently complete sit to stands with MinA   Skilled Therapeutic Interventions/Progress Updates:    Pt received sitting in wheelchair and agreeable to therapy. Gait belt placed prior to initiation of mobility. Tennis shoes donned by therapist for time management.   Dynamic standing balance with 4 colored circles on the ground MinA. Progressed exercise from BUE support or no UE support. Pt completed 3 rounds of stepping with BLE to each circle and 3 rounds of randomized color step. Verbal cuing for increased step length secondary to pt demonstrating a stutter step to each color. Pt improved step length bilaterally with verbal cuing and min manual facilitation for weight shifting.   Blue thera-ball roll out 2x10 while in sitting to promote increased anterior weight shifting with STS.    2x5 STS MinA. Verbal cuing to facilitate increased weight shifting throughout activities. Noted increased speed and motor planning of decent phase of sitting as repetitions increased, requiring significantly less time compared to previous session.   STS towards beginning of session requiring heavy modA and one instance of 3 attempts to stand. Pt progressed to MinA post ball roll out activity and improved balance.   Step ups RLE 10x then LLE x10 with eccentric step down on 6" step. MinA for balance and weight shifting.  Verbal cuing sequencing, posture, and activation of quads throughout activity. ModA provided halfway through LLE set and rest break given secondary to fatigue with poor body mechanics. Pt able to complete exercise with MinA and improved body mechanics after rest break.   Gait training ~82ft and ~46ft x2 into room/to bathroom with ModA and manual facilitation for continuous forward motion of RW to improve gait velocity. Pt requires verbal cuing for increased step length bilaterally, upright posture with gait training, and step-by-step sequencing RW/BLE during turns to correct small shuffle gait pattern and freezing episodes.   Pt completed tolieting with CGA for balance while performing peri care and clothing management.   Frequent rest breaks throughout session secondary to fatigue. Pt requires increased time for completion of activities and intermittent verbal cuing for attention to task.   Pt transitioned sitting EOB CGA with verbal cuing to bridge/utilization of bilateral elbows for repositioning hips / shoulders. Bed alarm on and call bell within reach. No further needs expressed at this time.   Therapy Documentation Precautions:  Precautions Precautions: Fall Precaution Comments: mild L hemiparesis Restrictions Weight Bearing Restrictions: No  Pain: Pain Assessment Pain Scale: 0-10 Pain Score: 0-No pain   Therapy/Group: Individual Therapy  Namish Krise, SPT 06/10/2021, 11:20 AM

## 2021-06-10 NOTE — Progress Notes (Signed)
Speech Language Pathology Daily Session Note  Patient Details  Name: Michaela Rios MRN: [id number] Date of Birth: 1951/02/21  Today's Date: 06/10/2021 SLP Individual Time: 1345-1430 SLP Individual Time Calculation (min): 45 min  Short Term Goals: Week 1: SLP Short Term Goal 1 (Week 1): Patient will demonstrate selective attention to functional tasks in a mildly-to-moderately distracting environment for 20 minutes with min A cues for re-direction SLP Short Term Goal 2 (Week 1): Patient will attend to left field of environment during functional tasks with mod A cues SLP Short Term Goal 3 (Week 1): Patient will demonstrate functional problem solving for basic and mildly complex tasks with mod A cues SLP Short Term Goal 4 (Week 1): Patient will demonstrate recent recall (15 minutes or greater) of novel information with mod A verbal cues with use of compensatory strategies SLP Short Term Goal 5 (Week 1): Patient will demonstrate improved awareness by identifying and repairing errors with mod A cues.  Skilled Therapeutic Interventions: Patient agreeable for skilled ST intervention with focus on cognitive goals. Patient was accompanied by her spouse today. SLP educated spouse on results of yesterday's evaluation and plan of care. SLP facilitated the following cognitive tasks. Verbal reasoning/problem solving: generated a word within a category based on initial letter (e.g., name a vegetable that begins with the letter b). Completed with sup A verbal (semantic) cues for reasoning and additional processing time. Immediate memory: patient was verbally provided a word list and asked to repeat and answer question based on various characteristics of the words. Achieved 75% accuracy with mod A verbal cues with list of 4 words, 60% accuracy with mod A verbal cues with 5 words.Visual scanning: patient able to locate items on a piece of paper within a large group of items at sup A verbal cues to scan to left  bottom corner. Alternating attention + visual scanning: completed "connect the dots" type of activity where patient connected certain objects based on the sequential order that was provided at the top of the page.  She completed with min A verbal cues to alternate attention. Patient would occasionally her place and require re-direction when she was known to attempt side conversation during the task. She required complete silence and minimal-to-no external stimulation to complete tasks. At one point she stated she could not concentrate because therapist shifted weight from one side-to-the other. Patient was left in her bed with alarm activated and needs within reach at end of session. Continue per ST plan of care.  Pain Pain Assessment Pain Scale: 0-10 Pain Score: 0-No pain  Therapy/Group: Individual Therapy  Patty Sermons 06/10/2021, 3:45 PM

## 2021-06-10 NOTE — IPOC Note (Signed)
Overall Plan of Care [facility]) Patient Details Name: Michaela Rios MRN: [id number] DOB: Oct 18, 1951  Admitting Diagnosis: Embolic stroke of right basal ganglia [facility])  Hospital Problems: Principal Problem:   Embolic stroke of right basal ganglia ([place]) Active Problems:   Thalamic stroke [facility])     Functional Problem List: Nursing Bladder, Bowel, Pain, Endurance, Medication Management, Safety  PT Balance, Behavior, Edema, Endurance, Motor, Pain, Safety, Sensory  OT Balance, Cognition, Endurance, Motor, Pain, Perception, Safety, Vision, Skin Integrity, Sensory  SLP Cognition  TR         Basic ADL's: OT Eating, Grooming, Bathing, Dressing, Toileting     Advanced  ADL's: OT       Transfers: PT Bed Mobility, Bed to Chair, Car, Manufacturing systems engineer, Metallurgist: PT Ambulation, Emergency planning/management officer, Stairs     Additional Impairments: OT Fuctional Use of Upper Extremity  SLP None      TR      Anticipated Outcomes Item Anticipated Outcome  Self Feeding ind  Swallowing      Basic self-care  S  Toileting  S   Bathroom Transfers S  Bowel/Bladder  manage bowel and bladder with min assist  Transfers  Supervision  Locomotion  Supervision with LRAD  Communication     Cognition  Min A  Pain  at or below level 4  Safety/Judgment  maintain safety with cues/reminders   Therapy Plan: PT Intensity: Minimum of 1-2 x/day ,45 to 90 minutes PT Frequency: 5 out of 7 days PT Duration Estimated Length of Stay: 21-24 days OT Intensity: Minimum of 1-2 x/day, 45 to 90 minutes OT Frequency: 5 out of 7 days OT Duration/Estimated Length of Stay: 2-2.5 weeks SLP Intensity: Minumum of 1-2 x/day, 30 to 90 minutes SLP Frequency: 3 to 5 out of 7 days SLP Duration/Estimated Length of Stay: 21-24   Due to the current state of emergency, patients may not be receiving their 3-hours of Medicare-mandated therapy.   Team Interventions: Nursing Interventions Bladder  Management, Medication Management, Disease Management/Prevention, Pain Management, Bowel Management, Patient/Family Education, Discharge Planning, Psychosocial Support  PT interventions Ambulation/gait training, Discharge planning, Functional mobility training, Therapeutic Activities, Psychosocial support, Visual/perceptual remediation/compensation, Balance/vestibular training, Disease management/prevention, Neuromuscular re-education, Skin care/wound management, Therapeutic Exercise, Wheelchair propulsion/positioning, DME/adaptive equipment instruction, Cognitive remediation/compensation, Pain management, Splinting/orthotics, UE/LE Strength taining/ROM, Community reintegration, Technical sales engineer stimulation, Patient/family education, IT trainer, UE/LE Coordination activities  OT Interventions Training and development officer, Disease mangement/prevention, Neuromuscular re-education, Self Care/advanced ADL retraining, Therapeutic Exercise, Wheelchair propulsion/positioning, UE/LE Strength taining/ROM, Skin care/wound managment, Pain management, DME/adaptive equipment instruction, Cognitive remediation/compensation, Community reintegration, Functional electrical stimulation, Patient/family education, Visual/perceptual remediation/compensation, Therapeutic Activities, Psychosocial support, Functional mobility training, Discharge planning, UE/LE Coordination activities  SLP Interventions Cognitive remediation/compensation, Environmental controls, Internal/external aids, Cueing hierarchy, Functional tasks, Medication managment, Patient/family education, Therapeutic Activities  TR Interventions    SW/CM Interventions Discharge Planning, Psychosocial Support, Patient/Family Education, Disease Management/Prevention   Barriers to Discharge MD  Medical stability  Nursing Decreased caregiver support, Home environment access/layout 1 level  2-5 ste right rail with spouse  PT Home environment access/layout    OT       SLP      SW Insurance for SNF coverage     Team Discharge Planning: Destination: PT-Home ,OT- Home , SLP-Home Projected Follow-up: PT-Outpatient PT, OT-  Home health OT, SLP-Outpatient SLP Projected Equipment Needs: PT-To be determined, OT- To be determined, SLP-None recommended by SLP Equipment Details: PT- , OT-  Patient/family involved in discharge  planning: PT- Patient,  OT-Patient, SLP-Patient  MD ELOS: 18-21d Medical Rehab Prognosis:  Good Assessment: 70 year old female with history of HTN, anxiety/depression, CKD III, migranes, chronic LBP, frequent UTIs who was admitted on 05/24/21 after a fall with reports of weakness, urinary incontinence and poor po intake. She was febrile in ED with T 101, was hypokalemic- K-2.8 and was tachycardic. She was treated with IVF and started on gentamicin for SIRS. She reported right flank pain and infectious work up negative. CT head showed multifocal hypoattenuations and LP recommended by neurology.   EEG showed intermittent generalized slowing in left temporal region felt to be nonspecific etiology.  Patient with elevated d-dimer-->CTA chest negative for PE and incidental right nodule ->recs to repeat CT in a year and BLE dopplers negative for DVT.  MRI brain showed restricted diffusion in right basal ganglia corresponding to lenticulostriate territory and hyperintensity in left head of caudate, left thalamus and right medial thalamus with vasogenic type edema felt to be unusual pattern --question of subacute ischemia, tumor, infection/cerebritis or toxoplasmosis.     She was treated with 5 day course of antibiotics and developed fever once antibiotics d/c. ID consulted for input and recommended monitoring off antibiotics. Repeat blood cultures negative.  LP done showing elevated protein  with 3 WBC, culture negative--CSF oligoclonal bands/paraneoplastic panel pending. . Vasculitis work up showed patient to be ANA positive/DSDNA negative and she was  treated with 3 day course of IV steroids due to concerns of vasculitis.   Dr. Leonel Ramsay questioned lymphoma as cause of stroke as well as T2 changes and recommended EBV serology--showed evidence of prior infection as well as TEE and MR spectroscopy. She underwent radial cerebral angiogram by Dr. Deatra Ina on 07/05 which was negative for vasculitis, AVM, stenosis, dural AV fistua or other significant vascular abnormality. Post procedure developed large dorsal hand hematoma treated with TR band and dopplers were negative for pseudoaneurysm. Dr. Oneida Alar consulted and felt that no evidence of compartment syndrome noted.   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 TEE done 07/07 and was negative for endocarditis but showed large patent PFO. Dr. Leonie Man felt that repeat MRI with "punctate infarcts likely periprocedural complication of cerebral angiogram and clinically silent" and he doubted that PFO was cause of her stroke. Doubt lymphoma as CSF negative for abnormal cells and hyperintensities improving--Loop recorder recommended due to rule out occult A fib. Neurology recommends DAPT X 3 weeks followed by ASA alone and follow up MRI brain in a month on outpatient basis.  Her mentation has been improving but she continues  to have deficits in balance w/ posterior/right lateral lean, delayed processing affecting ability to carry out ADLs as well as weakness. CIR recommended due to functional decline.    See Team Conference Notes for weekly updates to the plan of care

## 2021-06-11 NOTE — Progress Notes (Signed)
Occupational Therapy Session Note  Patient Details  Name: Michaela Rios MRN: [id number] Date of Birth: 08/18/1951  Today's Date: 06/11/2021 OT Individual Time: 2197-5883 OT Individual Time Calculation (min): 58 min  + 29 min   Short Term Goals: Week 1:  OT Short Term Goal 1 (Week 1): Pt will don pants mod A. OT Short Term Goal 2 (Week 1): Pt will complete STS with min A + LRAD in prep for standing ADL. OT Short Term Goal 3 (Week 1): Pt will complete toilet transfer min A.  Skilled Therapeutic Interventions/Progress Updates:     Session 1 (2549-8264): Pt received semi-reclined in bed with EVS present, denies pain, agreeable to therapy. Session focus on self-care retraining, activity tolerance, functional mobility, grooming in prep for improved ADL/IADL/func mobility performance + decreased caregiver burden. Came to sitting EOB close S, stand-pivot with min A to facilitate forward WS and for balance throughout session. Squat-pivot mod A to and from TTB with use of grab bar, difficulty motor planning req increased time and Vcs to execute. Completes UBD + UBB close S. Did req CGA intermittent sitting on TTB 2/2 posterior lean and heavy reliance on grab bar for balance. Mod A to doff/don brief and pants in standing for assist over hips. Min A for LBB to bathe buttocks and STS with CGA and BUE on grab bars. Completed grooming in seated at sink with close S and increased time. Req freq rest breaks throughout 2/2 fatigue. Returned to supine with min A to adjust trunk. Total A to doff/don B socks 2/2 time constraints.   Pt left semi-reclined in bed with RN present with bed alarm engaged, call bell in reach, and all immediate needs met.    Session 2 [id number]): Pt received semi-reclined in bed with niece present, agreeable to therapy. Session focus on self-care retraining, activity tolerance, STS in prep for improved ADL/IADL/func mobility performance + decreased caregiver burden.  Came to sitting  EOB close S. Donned B shoes total A 2/2 time constraints. Stand-pivot with min A > w/c > toilet, cues for correcting posterior bias and heavy reliance on GB. Min A for balance during LB clothing management and pericare in standing. Cont void of bladder. C/o mild pain with urination, RN notified. Sat at sink to complete oral care with increased time, req cues to problem solve use of cup for rinsing/spitting. Completed 4 STS progressing from min A to close S with cues for technique and to correct posterior bias. Utilized sink for RUE support.   Pt left seated in w/c with safety belt alarm engaged, call bell in reach, and all immediate needs met.    Therapy Documentation Precautions:  Precautions Precautions: Fall Precaution Comments: mild L hemiparesis Restrictions Weight Bearing Restrictions: No  Pain: see session note   ADL: See Care Tool for more details. Therapy/Group: Individual Therapy  Volanda Napoleon MS, OTR/L  06/11/2021, 6:44 AM

## 2021-06-11 NOTE — Progress Notes (Signed)
Speech Language Pathology Daily Session Note  Patient Details  Name: Michaela Rios MRN: [id number] Date of Birth: 07/28/1951  Today's Date: 06/11/2021 SLP Individual Time: 1452-1535 SLP Individual Time Calculation (min): 43 min  Short Term Goals: Week 1: SLP Short Term Goal 1 (Week 1): Patient will demonstrate selective attention to functional tasks in a mildly-to-moderately distracting environment for 20 minutes with min A cues for re-direction SLP Short Term Goal 2 (Week 1): Patient will attend to left field of environment during functional tasks with mod A cues SLP Short Term Goal 3 (Week 1): Patient will demonstrate functional problem solving for basic and mildly complex tasks with mod A cues SLP Short Term Goal 4 (Week 1): Patient will demonstrate recent recall (15 minutes or greater) of novel information with mod A verbal cues with use of compensatory strategies SLP Short Term Goal 5 (Week 1): Patient will demonstrate improved awareness by identifying and repairing errors with mod A cues.  Skilled Therapeutic Interventions: Patient agreeable to skilled ST intervention with focus on cognitive goals. Completed session in patient's room to support a quiet, low stimulation environment. Patient easily distractible to outside noise and staff opening door to check on patient. Patient easy to redirect although external stimuli appeared frustrating for her as she prefers a completely quiet environment. SLP facilitated money counting task with coins. Patient completed with min A verbal/visual cues for correct addition/subtraction and visual scanning. Facilitated pill label comprehension with 100% accuracy with sup A verbal cues. Patient receives pre-filled pill packs from her local pharmacy and wishes to continue this at discharge. Completed medication management scenarios with 80% accuracy and min A verbal cues. Patient was left in her wheelchair with alarm activated and needs within reach at end of  session. Continue per plan of care.    Pain Pain Assessment Pain Scale: 0-10 Pain Score: 0-No pain  Therapy/Group: Individual Therapy  Patty Sermons 06/11/2021, 3:40 PM

## 2021-06-11 NOTE — Progress Notes (Signed)
Physical Therapy Session Note  Patient Details  Name: Michaela Rios MRN: [id number] Date of Birth: [birth year]  Today's Date: 06/11/2021 PT Individual Time: 1640-1735 PT Individual Time Calculation (min): 55 min   Short Term Goals: Week 1:  PT Short Term Goal 1 (Week 1): Pt will complete bed mobility MinA PT Short Term Goal 2 (Week 1): Pt will complete bed<>chair transfer MinA PT Short Term Goal 3 (Week 1): Pt will ambulate at least 55ft ModA to progression towards ambulation LTG PT Short Term Goal 4 (Week 1): Pt will complete at least 4 stairs MinA PT Short Term Goal 5 (Week 1): Pt will consistently complete sit to stands with MinA   Skilled Therapeutic Interventions/Progress Updates:   Pt received sitting in WC and agreeable to PT. Pt performed gait training 2 x 48ft with CGA-min assist for safety and improved AD management to improve step length and increase cadence. Prolonged therapeutic rest break between bouts.  Dynamic balance training while engaged in fine motor task of Wii bowling. Min assist from PT for control of RUE due to mild hand tremor with fatigue. CGA for safety in standing with 1-2 UE support on RW; able to tolerate standing x 7 frames prior to requiring seated rest break.   Nustep reciprocal movement training level 3  2.5 min and levl 2 x 2 min. Min assist from PT progressing to supervision to improve ROM in the RLE and LUE. Prolonged therapeutic rest break between bouts due to BUE/BLE fatigue.    Throughout session pt performed sit<>stand and stand pivot transfer to to and from [facility] with min assist. Cues for AD management and improved UE placement to reduce fall risk when preparing to sit.   Pt returned to room and performed stand pivot transfer to bed with min assist and RW as listed above. Sit>supine completed with min assist at the LLE, and left supine in bed with call bell in reach and all needs met.        Therapy Documentation Precautions:   Precautions Precautions: Fall Precaution Comments: mild L hemiparesis Restrictions Weight Bearing Restrictions: No    Pain: Pain Assessment Pain Scale: 0-10 Pain Score: 0-No pain    Therapy/Group: Individual Therapy  Lorie Phenix 06/11/2021, 5:51 PM

## 2021-06-12 LAB — COMPREHENSIVE METABOLIC PANEL
ALT: 18 U/L (ref 0–44)
AST: 27 U/L (ref 15–41)
Albumin: 2.9 g/dL — ABNORMAL LOW (ref 3.5–5.0)
Alkaline Phosphatase: 65 U/L (ref 38–126)
Anion gap: 8 (ref 5–15)
BUN: 14 mg/dL (ref 8–23)
CO2: 21 mmol/L — ABNORMAL LOW (ref 22–32)
Calcium: 9.2 mg/dL (ref 8.9–10.3)
Chloride: 109 mmol/L (ref 98–111)
Creatinine, Ser: 0.77 mg/dL (ref 0.44–1.00)
GFR, Estimated: >60 mL/min (ref >60)
Glucose, Bld: 99 mg/dL (ref 70–99)
Potassium: 3.5 mmol/L (ref 3.5–5.1)
Sodium: 138 mmol/L (ref 135–145)
Total Bilirubin: 0.3 mg/dL (ref 0.3–1.2)
Total Protein: 6.6 g/dL (ref 6.5–8.1)

## 2021-06-12 LAB — CBC
HCT: 31.8 % — ABNORMAL LOW (ref 36.0–46.0)
Hemoglobin: 10.4 g/dL — ABNORMAL LOW (ref 12.0–15.0)
MCH: 29.4 pg (ref 26.0–34.0)
MCHC: 32.7 g/dL (ref 30.0–36.0)
MCV: 89.8 fL (ref 80.0–100.0)
Platelets: 291 10*3/uL (ref 150–400)
RBC: 3.54 MIL/uL — ABNORMAL LOW (ref 3.87–5.11)
RDW: 16 % — ABNORMAL HIGH (ref 11.5–15.5)
WBC: 6 10*3/uL (ref 4.0–10.5)
nRBC: 0 % (ref 0.0–0.2)

## 2021-06-12 NOTE — Progress Notes (Signed)
Occupational Therapy Session Note  Patient Details  Name: Michaela Rios MRN: [id number] Date of Birth: 01/12/1951  Today's Date: 06/12/2021 OT Individual Time: 1410-1435 25 min    and Today's Date: 06/12/2021 OT Missed Time: 78 Minutes Missed Time Reason: Patient unwilling/refused to participate without medical reason;Other (comment) (Wanting to eat breakfast)   Short Term Goals: Week 1:  OT Short Term Goal 1 (Week 1): Pt will don pants mod A. OT Short Term Goal 2 (Week 1): Pt will complete STS with min A + LRAD in prep for standing ADL. OT Short Term Goal 3 (Week 1): Pt will complete toilet transfer min A.  Skilled Therapeutic Interventions/Progress Updates:    Session 1: Pt received semi-reclined in bed eating breakfast, denies pain but endorses poor sleep and urinary urgency. Requesting to eat breakfast prior to exiting bed, declines sitting in w/c. Returned 20 min later, pt still eating breakfast. Req A to open breakfast items. Pt missed 45 min of scheduled OT, will reattempt to make up as able. Pt left semi-reclined in bed with bed alarm engaged, call bell in reach, and all immediate needs met.     Session 2 (1410-1435): Pt received semi-reclined in bed, denied pain, agreeable to therapy. Session focus on self-care retraining, activity tolerance, func transfers in prep for improved ADL/IADL/func mobility performance + decreased caregiver burden. Came to sitting EOB with increased time and close S. Total A to don/doff B shoes 2/2 time constraints. Stand-pivot with min A for balance > w/c > toilet with use of arm rests/grab bars. Max A for LB clothing management and posterior hygiene post continent void of b/b. Total A to change brief as it was slightly soiled. Completed seated hand hygiene distant S and assist to reach paper towels.   Pt left seated in w/c with safety belt alarm engaged, call bell in reach, and all immediate needs met.    Therapy Documentation Precautions:   Precautions Precautions: Fall Precaution Comments: mild L hemiparesis Restrictions Weight Bearing Restrictions: No  Pain: denies   ADL: See Care Tool for more details.   Therapy/Group: Individual Therapy  Volanda Napoleon MS, OTR/L  06/12/2021, 6:44 AM

## 2021-06-12 NOTE — Progress Notes (Signed)
Physical Therapy Session Note  Patient Details  Name: Michaela Rios MRN: [id number] Date of Birth: Aug 22, 1951  Today's Date: 06/12/2021 PT Individual Time: 0918-1003 PT Individual Time Calculation (min): 45 min   Short Term Goals: Week 1:  PT Short Term Goal 1 (Week 1): Pt will complete bed mobility MinA PT Short Term Goal 2 (Week 1): Pt will complete bed<>chair transfer MinA PT Short Term Goal 3 (Week 1): Pt will ambulate at least 62ft ModA to progression towards ambulation LTG PT Short Term Goal 4 (Week 1): Pt will complete at least 4 stairs MinA PT Short Term Goal 5 (Week 1): Pt will consistently complete sit to stands with MinA  Skilled Therapeutic Interventions/Progress Updates:    First Session: Pt received in wc from nursing coming out of restroom. Pt agreeable to therapy and request to change clothes prior to going to the gym. Shirt donned/doffed with set up assist/MinA. Pants donned in standing with ModA and CGA for balance at RW. Tennis shoes donned by therapist for time management.   STS throughout session with CGA after verbal cuing for feet placement and anterior weight shifting.   Pt completed 1 x 146ft with RW CGA-min assist for safety and improved RW management to improve step length and increase cadence. Verbal cuing to look up/ahead for path navigation. Pt completed obstacle course 2x76ft that consisted of weaving around cones and stepping over DB. Same cuing as above. Pt required additional cuing to step up to DB prior to stepping over for improved balance/safety and foot clearance over obstacle.   Dynamic standing balance with bean bag toss game 2x12 bags. LUE support with wide BOS first round. No UE support and wide base-tandem for the second round. CGA for safety and intermittent verbal cuing for weight shifting left in-between tosses.   Noted freezing/festination movement patterns with RUE/RLE during step over task and bean bag toss. This was improved with  verbal/visual cuing with big gestures.   Pt sitting in wc with seatbelt alarm on, tray table and call bell within reach. Pt requested ice before therapist leaves.   Second Session: Pt sitting in wc and agreeable to therapy at start of session. Pt wheeled outside for time management and energy conservation.   STS throughout session with CGA after verbal cuing for feet placement and anterior weight shifting. Frequent rest break secondary to fatigue.    Pt completed gait training 1x156ft RW on unlevel surface outdoors CGA-min assist for safety and improved RW management to improve step length and increase cadence. Verbal cuing for navigating environmental obstacles. 1x 178ft CGA in hall > pt's bed with HHA on RUE.  Noted significant improvement in increased cadence and step length with HHA. Verbal cuing to look up/ahead for path navigation and heel strike.   Seated exercises with feet supported completed for dynamic balance and increased trunk rotation. Noted decreased trunk rotation towards the right requiring increased verbal and visual cuing to improve. Kickball toss with forward and cross body movements 4 x 30". Verbal cuing for core activation while catching for postural stability. Pt demonstrates decreased force while tossing that improve with verbal cuing to timing release. Upper body PNF patterns used 1x10 each direction with ball to hand targets to encourage reaching outside BOS.   Sitting EOB pt demonstrated ability to doff left shoe with supervision and untied right shoe. Pt required MinA to remove heel from shoe before doffing. Supervision transfer sitting > Supine.   Pt returned to supine with HOB elevated at  the end of session.  Ice water and call bell within reach. Bed alarm on.   Therapy Documentation Precautions:  Precautions Precautions: Fall Precaution Comments: mild L hemiparesis Restrictions Weight Bearing Restrictions: No  Pain: Pain Assessment Pain Scale: 0-10 Pain  Score: 0-No pain   Therapy/Group: Individual Therapy  Miriah Maruyama, SPT  06/12/2021, 10:27 AM

## 2021-06-13 NOTE — Progress Notes (Signed)
PROGRESS NOTE   Subjective/Complaints:  Pt reports 3 BM's yesterday- that's what is charted.  Otherwise, resting this AM- has no issues.     ROS-  Pt denies SOB, abd pain, CP, N/V/C/D, and vision changes   Objective:   No results found. Recent Labs    06/12/21 0519  WBC 6.0  HGB 10.4*  HCT 31.8*  PLT 291   Recent Labs    06/12/21 0519  NA 138  K 3.5  CL 109  CO2 21*  GLUCOSE 99  BUN 14  CREATININE 0.77  CALCIUM 9.2    Intake/Output Summary (Last 24 hours) at 06/13/2021 1548 Last data filed at 06/13/2021 0730 Gross per 24 hour  Intake 168 ml  Output --  Net 168 ml        Physical Exam: Vital Signs Blood pressure 121/81, pulse 91, temperature 99.7 F (37.6 C), temperature source Oral, resp. rate 17, height 5\' 3"  (1.6 m), weight 56.1 kg, SpO2 98 %.   Tc=Tm- 99.7- but feels well General: awake, alert, appropriate, but resting in bed; supine; NAD HENT: conjugate gaze; oropharynx moist CV: regular rate; no JVD Pulmonary: CTA B/L; no W/R/R- good air movement GI: soft, NT, ND, (+)BS Psychiatric: appropriate; quiet Neurological: alert  Extremities: No clubbing, cyanosis, or edema Skin: ecchymosis and swelling but no open areas dorsum of RIght hand Neurologic: Cranial nerves II through XII intact, motor strength is 5/5 in bilateral deltoid, bicep, tricep, grip, hip flexor, knee extensors, ankle dorsiflexor and plantar flexor Sensory exam normal sensation to light touch and proprioception in bilateral upper and lower extremities Cerebellar exam normal finger to nose to finger as well as heel to shin in bilateral upper and lower extremities Musculoskeletal: Full range of motion in all 4 extremities. No joint swelling   Assessment/Plan: 1. Functional deficits which require 3+ hours per day of interdisciplinary therapy in a comprehensive inpatient rehab setting. Physiatrist is providing close team  supervision and 24 hour management of active medical problems listed below. Physiatrist and rehab team continue to assess barriers to discharge/monitor patient progress toward functional and medical goals  Care Tool:  Bathing    Body parts bathed by patient: Right arm, Left arm, Chest, Face, Front perineal area, Abdomen, Right upper leg, Left upper leg, Right lower leg, Left lower leg   Body parts bathed by helper: Buttocks     Bathing assist Assist Level: Minimal Assistance - Patient > 75%     Upper Body Dressing/Undressing Upper body dressing   What is the patient wearing?: Pull over shirt    Upper body assist Assist Level: Supervision/Verbal cueing    Lower Body Dressing/Undressing Lower body dressing      What is the patient wearing?: Pants, Underwear/pull up     Lower body assist Assist for lower body dressing: Moderate Assistance - Patient 50 - 74%     Toileting Toileting    Toileting assist Assist for toileting: Maximal Assistance - Patient 25 - 49%     Transfers Chair/bed transfer  Transfers assist     Chair/bed transfer assist level: Moderate Assistance - Patient 50 - 74%     Locomotion Ambulation   Ambulation assist  Assist level: Moderate Assistance - Patient 50 - 74% Assistive device: Walker-rolling Max distance: 65ft   Walk 10 feet activity   Assist     Assist level: Moderate Assistance - Patient - 50 - 74% Assistive device: Walker-rolling   Walk 50 feet activity   Assist Walk 50 feet with 2 turns activity did not occur: Safety/medical concerns         Walk 150 feet activity   Assist Walk 150 feet activity did not occur: Safety/medical concerns         Walk 10 feet on uneven surface  activity   Assist Walk 10 feet on uneven surfaces activity did not occur: Safety/medical concerns         Wheelchair     Assist Will patient use wheelchair at discharge?: No   Wheelchair activity did not occur: N/A          Wheelchair 50 feet with 2 turns activity    Assist    Wheelchair 50 feet with 2 turns activity did not occur: N/A       Wheelchair 150 feet activity     Assist  Wheelchair 150 feet activity did not occur: N/A       Blood pressure 121/81, pulse 91, temperature 99.7 F (37.6 C), temperature source Oral, resp. rate 17, height 5\' 3"  (1.6 m), weight 56.1 kg, SpO2 98 %.   Medical Problem List and Plan: 1.  B/L weakness secondary to R BG and B/L thalamic infarcts             -patient may  shower             -ELOS/Goals: 21-24 days- supervision to min A  Con't PT, OT and SLP 2.  Antithrombotics: -DVT/anticoagulation:  Pharmaceutical: Lovenox             -antiplatelet therapy: DAPT X 3 weeks followed by ASA alone.  3. Pain Management: Tylenol prn. Has migraines- cannot take Imitrex-   7/17- pt has Norco prn for migraines-I don't know Neuro's viewpoint on Nurtec for migraines after stroke? 4. Mood: LCSW to follow for evaluation and support.              -antipsychotic agents: N/A 5. Neuropsych: This patient is not  capable of making decisions on his own behalf. 6. Skin/Wound Care: Routine pressure relief measures.  7. Fluids/Electrolytes/Nutrition: Monitor I/O. Check lytes in am.  BMET nl 8. HTN: Monitor BP tid-- Vitals:   06/13/21 0815 06/13/21 1530  BP: 121/87 121/81  Pulse: 88 91  Resp:  17  Temp: 98.4 F (36.9 C) 99.7 F (37.6 C)  SpO2: 100% 98%   7/17- BP controlled- -con't regimen 9. CKD III: Has been incontinent since admission. Will monitor voiding with PVR checkss.             --set toileting schedule.  7/17- voiding some on toilet- but still incontinent- con't voiding/toileting schedule 10. H/o depression/anxiety: Managed with Effexor, Wellbutrin and lorazepam prn. 11. H/o Migraines: controlled on Topamax.  Has had 4x since admission- may use Nurtec prn since this is not a vasoconstrictor; increase Topamax to 100mg  BID.   7/17- will have primary ask  if Nurtec appropriate? Depends on what Neuro says 12. RML nodule: Recommend repeat CT in 12 months. 13. Hyponatremia: Continue to monitor for now 14. Acute on chronic anemia of chronic disease:  Hgb 11.6 at admission-->9.9 15. FUO: WBC trending up but likely due to steroids.             --  Continue to monitor for fevers or other signs of infection. 16. Poor appetite- will monitor- might need appetite stimulant 17. Constipation vs poor appetite- LBM 6 days ago- suggest KUB.    7/17- had 3 small to medium BM's yesterday- con't regimen for now  LOS: 5 days A FACE TO FACE EVALUATION WAS PERFORMED  Michaela Rios 06/13/2021, 3:48 PM

## 2021-06-14 ENCOUNTER — Inpatient Hospital Stay (HOSPITAL_COMMUNITY): Payer: Medicare HMO

## 2021-06-14 LAB — URINALYSIS, ROUTINE W REFLEX MICROSCOPIC
Bilirubin Urine: NEGATIVE
Glucose, UA: NEGATIVE mg/dL
Hgb urine dipstick: NEGATIVE
Ketones, ur: NEGATIVE mg/dL
Leukocytes,Ua: NEGATIVE
Nitrite: NEGATIVE
Protein, ur: NEGATIVE mg/dL
Specific Gravity, Urine: 1.014 (ref 1.005–1.030)
pH: 6 (ref 5.0–8.0)

## 2021-06-14 LAB — CBC
HCT: 31.8 % — ABNORMAL LOW (ref 36.0–46.0)
Hemoglobin: 10.4 g/dL — ABNORMAL LOW (ref 12.0–15.0)
MCH: 29.5 pg (ref 26.0–34.0)
MCHC: 32.7 g/dL (ref 30.0–36.0)
MCV: 90.3 fL (ref 80.0–100.0)
Platelets: 305 10*3/uL (ref 150–400)
RBC: 3.52 MIL/uL — ABNORMAL LOW (ref 3.87–5.11)
RDW: 16.3 % — ABNORMAL HIGH (ref 11.5–15.5)
WBC: 5.7 10*3/uL (ref 4.0–10.5)
nRBC: 0 % (ref 0.0–0.2)

## 2021-06-14 IMAGING — CR DG CHEST 2V
2 series · 2 of 2 positions shown · non-contrast
Comparison: PA and lateral chest [DATE].

CLINICAL DATA: Rales in the right chest on physical examination.

EXAM:
CHEST - 2 VIEW

[chest pa]
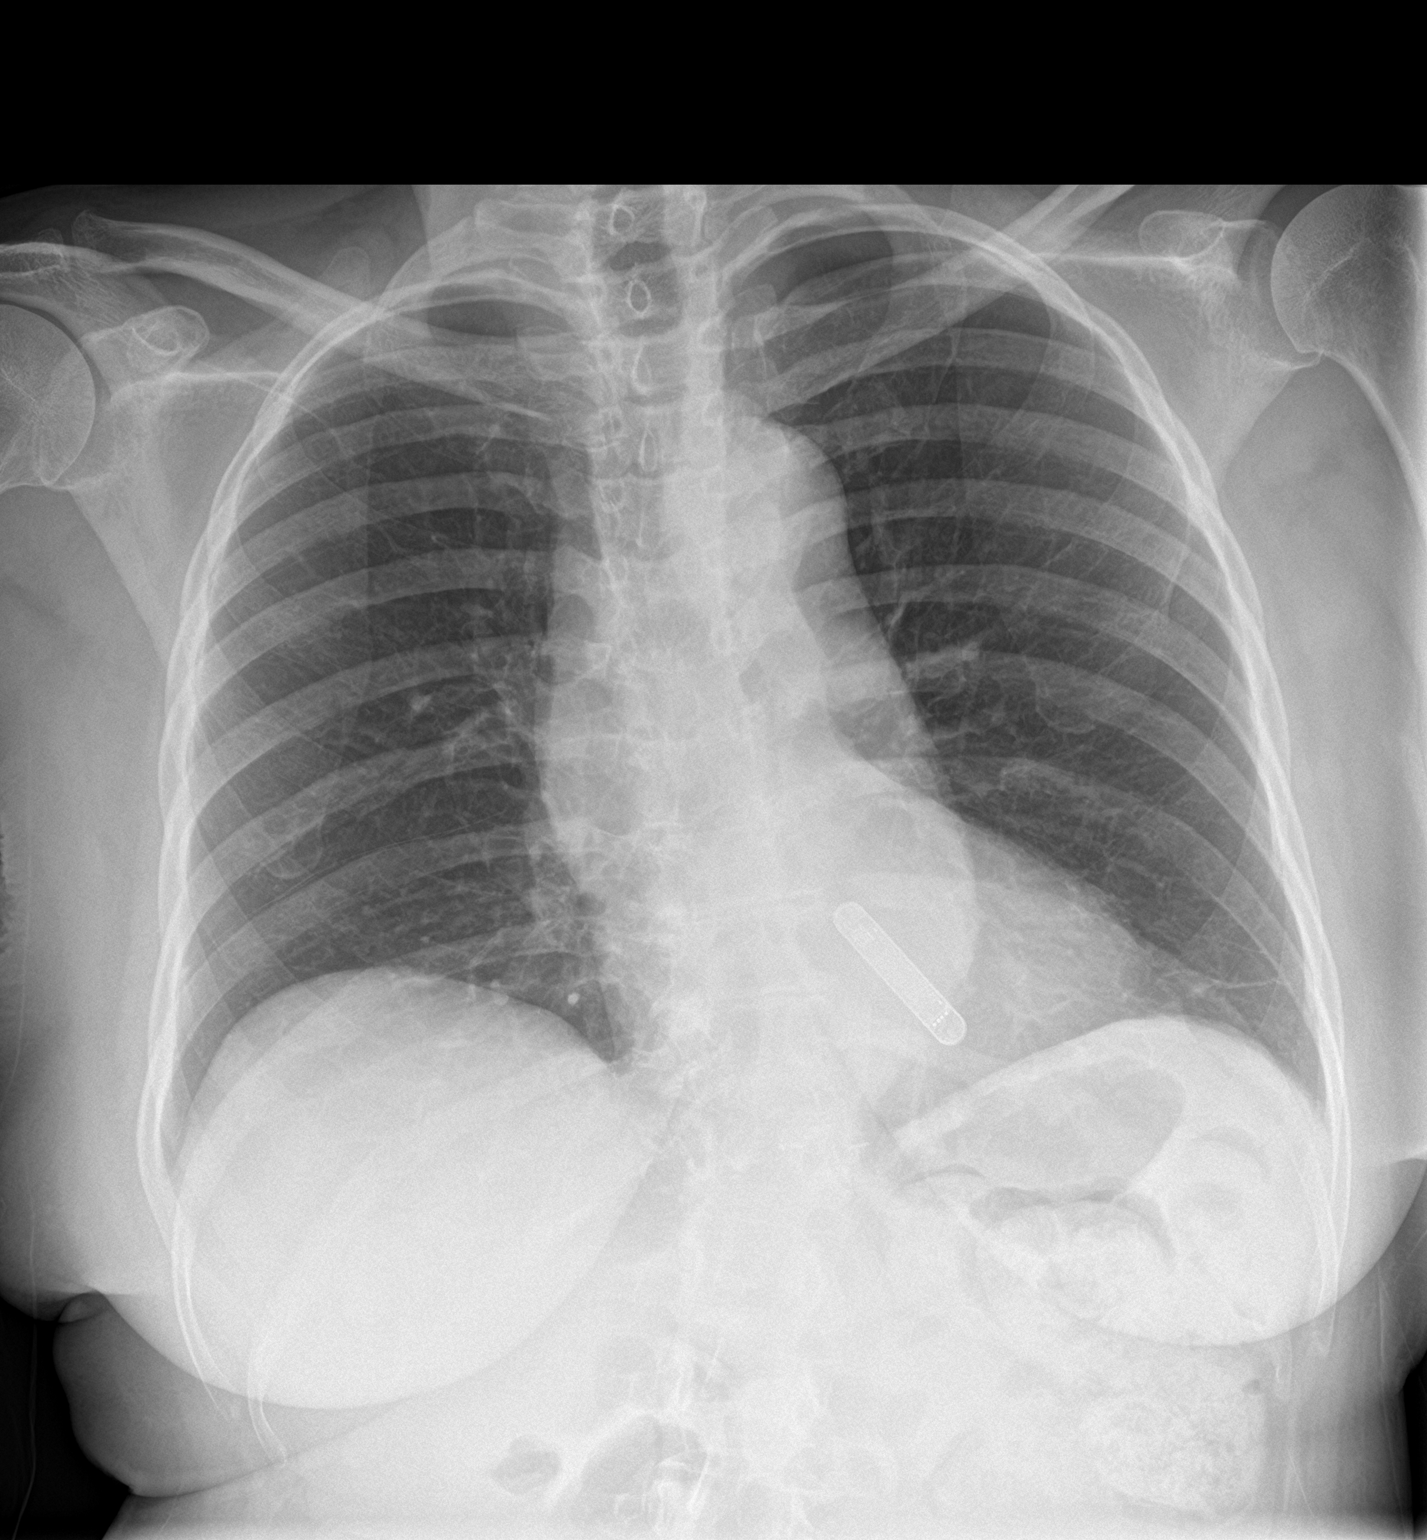

[chest lat]
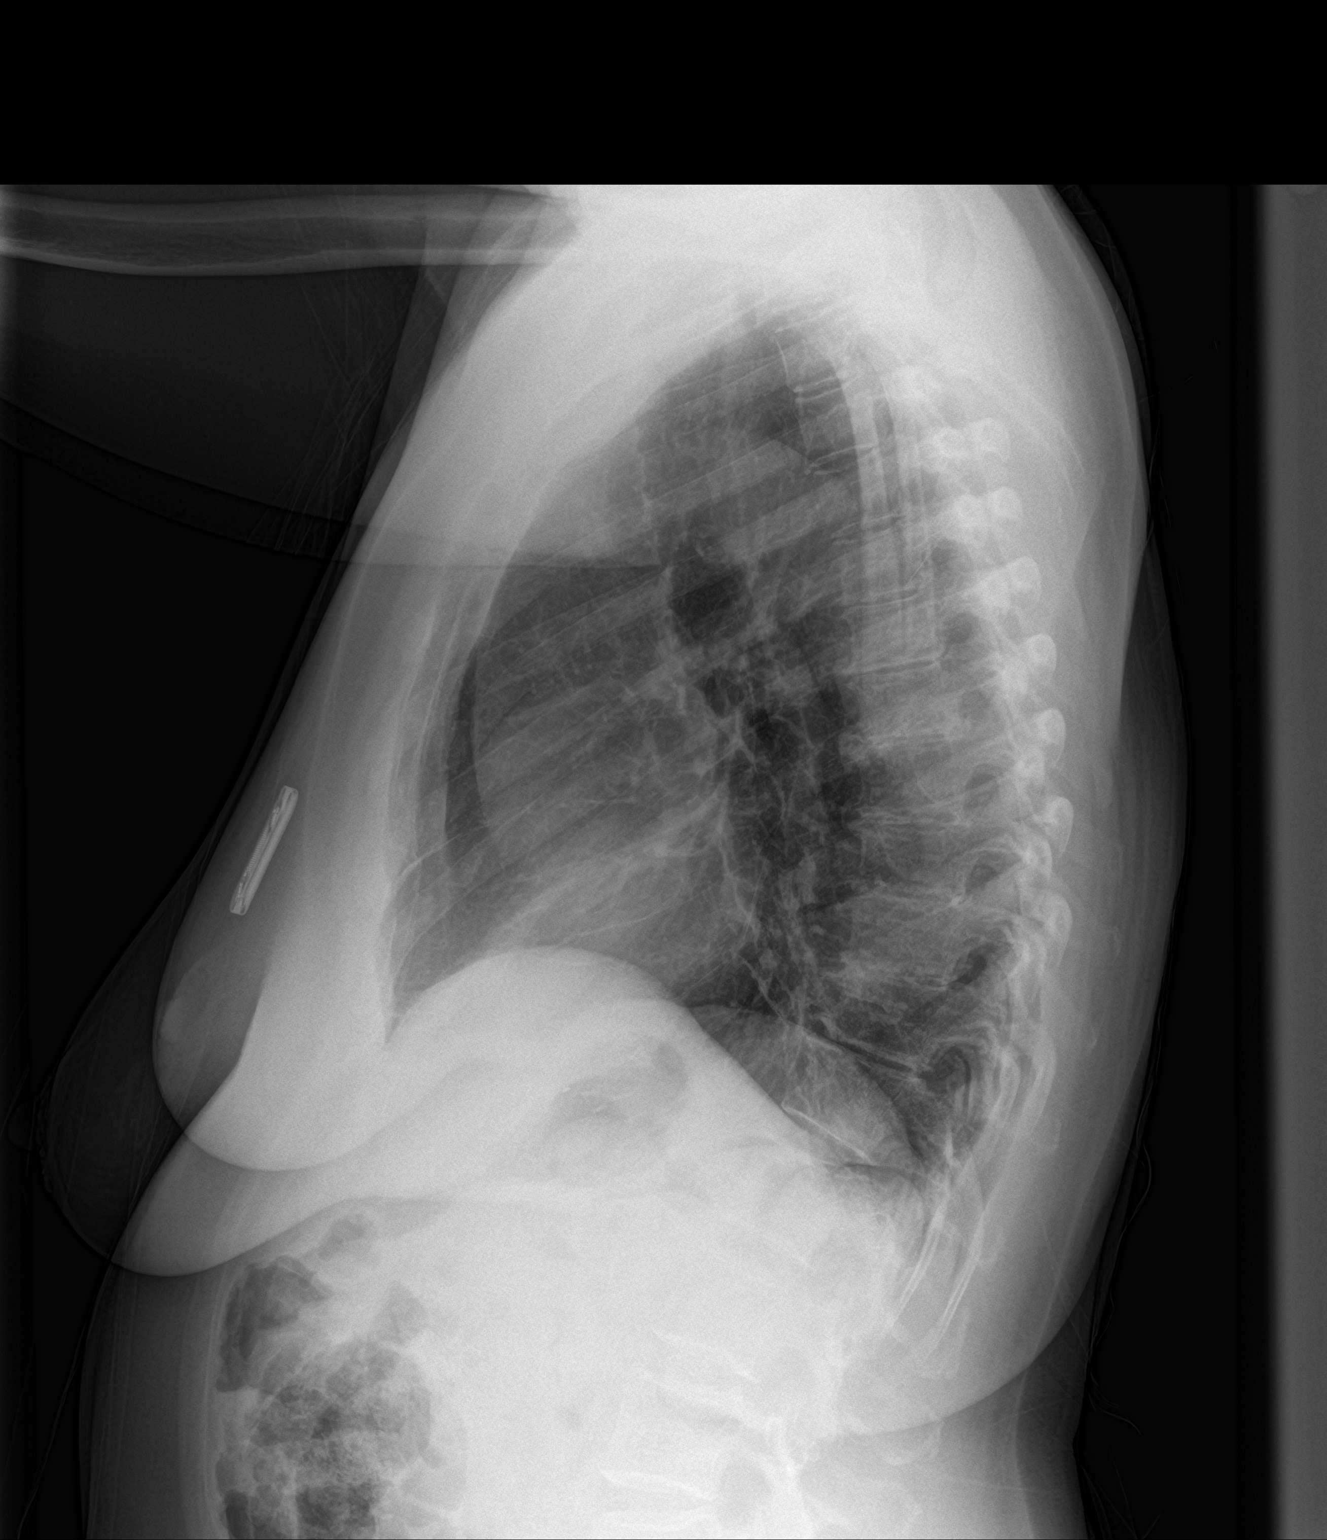

[2 of 2 positions shown; findings below may reference images not displayed]

FINDINGS: The lungs are clear. Heart size is normal. Loop recorder noted. No
pneumothorax or pleural fluid. No acute or focal bony abnormality.
IMPRESSION: Negative chest.

## 2021-06-14 MED ORDER — SENNOSIDES-DOCUSATE SODIUM 8.6-50 MG PO TABS
2.0000 | ORAL_TABLET | Freq: Every day | ORAL | Status: DC
  Administered 2021-06-14 – 2021-06-30 (×16): 2 via ORAL
  Filled 2021-06-14 (×17): qty 2

## 2021-06-14 NOTE — Progress Notes (Signed)
Physical Therapy Session Note  Patient Details  Name: Michaela Rios MRN: [id number] Date of Birth: [birth year]  Today's Date: 06/14/2021 PT Individual Time: 0807-0910 PT Individual Time Calculation (min): 63 min   Short Term Goals: Week 1:  PT Short Term Goal 1 (Week 1): Pt will complete bed mobility MinA PT Short Term Goal 2 (Week 1): Pt will complete bed<>chair transfer MinA PT Short Term Goal 3 (Week 1): Pt will ambulate at least 63ft ModA to progression towards ambulation LTG PT Short Term Goal 4 (Week 1): Pt will complete at least 4 stairs MinA PT Short Term Goal 5 (Week 1): Pt will consistently complete sit to stands with MinA  Skilled Therapeutic Interventions/Progress Updates:  Patient supine in bed on entrance to room. Patient initially asleep and requires time and effort to rouse. When finally alert, pt agreeable to PT session and relates that she must toilet and dress. Patient with no pain complaint throughout session.  Therapeutic Activity: Bed Mobility: Patient performed supine --> sit with CGA and vc/ tc required for technique. Min A to complete forward lean to scoot toward EOB to place Bil feet on floor for improved balance. Transfers: Patient performed STS and SPVT transfers with Min/ Mod A and either HHA or UE support to armrests. Provided vc/tc for reaching back for armrests prior to sit, pushing from seat or armrests to push to stand, leaning forward with head and flexing at hips to initiate reaching back for armrests. Toilet transfer performed with Min A and BUE support at safety rails. Pt able to maintain balance with no UE support in order to doff/ don pants with min A. Performs pericare with supervision/ setup.   Gait Training:  Patient ambulated 12' x1/ 71' x2 with no AD and Mod A provided at hips for balance. Demonstrated decreased step length/ height, decreased weight shift, Ambulated 20 feet in room with improved quality of gait which disappears when location of  gait moved to hallway with more visual distractions. Provided vc/ tc for appropriate shift of weight with step progression, increasing step length/ height, increasing knee flexion of LLE.  Neuromuscular Re-ed: NMR facilitated during session with focus on standing balance during functional tasks. Pt guided in static standing activities at sink with no UE support. Pt is able to maintain balance with intermittent brief touches to sink for weight shifting on feet and to maintain balance. All performed with close supervision/ CGA. NMR performed for improvements in motor control and coordination, balance, sequencing, judgement, and self confidence/ efficacy in performing all aspects of mobility at highest level of independence.   Patient seated upright  in w/c at end of session with brakes locked, belt alarm set, and all needs within reach.     Therapy Documentation Precautions:  Precautions Precautions: Fall Precaution Comments: mild L hemiparesis Restrictions Weight Bearing Restrictions: No  Therapy/Group: Individual Therapy  Alger Simons PT, DPT 06/14/2021, 12:33 PM

## 2021-06-14 NOTE — Progress Notes (Addendum)
Speech Language Pathology Daily Session Note  Patient Details  Name: Michaela Rios MRN: [id number] Date of Birth: 02-Sep-1951  Today's Date: 06/14/2021 SLP Individual Time: 1115-1200 SLP Individual Time Calculation (min): 45 min  Short Term Goals: Week 1: SLP Short Term Goal 1 (Week 1): Patient will demonstrate selective attention to functional tasks in a mildly-to-moderately distracting environment for 20 minutes with min A cues for re-direction SLP Short Term Goal 2 (Week 1): Patient will attend to left field of environment during functional tasks with mod A cues SLP Short Term Goal 3 (Week 1): Patient will demonstrate functional problem solving for basic and mildly complex tasks with mod A cues SLP Short Term Goal 4 (Week 1): Patient will demonstrate recent recall (15 minutes or greater) of novel information with mod A verbal cues with use of compensatory strategies SLP Short Term Goal 5 (Week 1): Patient will demonstrate improved awareness by identifying and repairing errors with mod A cues.  Skilled Therapeutic Interventions: Patient agreeable to skilled ST intervention with focus on cognition. Session took place in a quiet, low stimulating environment per patient request, as she reports her hearing feels increasingly sensitive since CVA resulting in increased distractibility. Provided education on internal and external memory strategies including categorization/chunking, visualization, writing reminders, using calendar, etc. Facilitated short-term recall task using the discussed strategies involving recall of a grocery list containing 10 items. Patient recalled 3/10 items independently after 10 delay with distractor task in between. Able to recall additional 7 items with min A semantic cues and additional processing time. Able to sustain attention for 35 minutes with sup-to-min A verbal cues for re-direction. Patient was left in wheelchair with alarm activated and immediate needs within reach  at end of session. Continue per current plan of care.      Pain Pain Assessment Pain Scale: 0-10 Pain Score: 0-No pain  Therapy/Group: Individual Therapy  Patty Sermons, M.S., CCC-SLP  Patty Sermons 06/14/2021, 12:53 PM

## 2021-06-14 NOTE — Progress Notes (Signed)
Occupational Therapy Session Note  Patient Details  Name: Michaela Rios MRN: [id number] Date of Birth: [birth year]  Today's Date: 06/14/2021 OT Individual Time: 5051-8335 OT Individual Time Calculation (min): 89 min    Short Term Goals: Week 1:  OT Short Term Goal 1 (Week 1): Pt will don pants mod A. OT Short Term Goal 2 (Week 1): Pt will complete STS with min A + LRAD in prep for standing ADL. OT Short Term Goal 3 (Week 1): Pt will complete toilet transfer min A.  Skilled Therapeutic Interventions/Progress Updates:    Pt received seated in w/c, agreeable to OT, reporting no pain throughout session. Pt requested and provided with soda; then session focused on BADLs. Pt reporting she was incontinent of bladder twice recently; agreeable to trial in bathroom. Pt husband entered briefly but left for remainder of session. Sit<>stands throughout min A progressing to [facility] with RW. Stand pivot transfer with use of grab bars min-mod A for sequencing and slight posterior bias. RN entered to retrieve urine sample. Pt completed 3/3 toileting tasks with min A for clothing management in stance; pt completed perihygiene and wiping buttocks in stance with CGA. Stand pivot transfer toilet>w/c mod A for sequencing and posterior bias despite vc's. Pt engaged in UB bathing and oral hygiene seated at sink in w/c close spvsn with increased time as pt attempted to remove denture partials but unable to do so. Donned long sleeve shirt/jacket with min A for RUE as pt demoed difficulties problem solving when getting arm stuck. Pt engaged in functional ambulation x2 trials with RW and CGA-min A. Both trials required navigation of obstacles in tight spaces with pt requiring mod multimodal cues for not bumping into obstacles on L side, min vc's for maintaining upright posture and safe use of RW. Pt demoed shuffling gait pattern and decreased speed; requiring rest break between trials due to BLEs and BUEs becoming shaky. Max A to  doff shoes EOB. Sit>sup close spvsn. Scooting HOB mod vc's for use of bed rails and bridging hips. Pt remained supine, alarm set, call bell in reach, and all immediate needs met.   Therapy Documentation Precautions:  Precautions Precautions: Fall Precaution Comments: mild L hemiparesis Restrictions Weight Bearing Restrictions: No  Pain: Pain Assessment Pain Scale: 0-10 Pain Score: 0-No pain    Therapy/Group: Individual Therapy  Mellissa Kohut 06/14/2021, 12:52 PM

## 2021-06-14 NOTE — Progress Notes (Signed)
PROGRESS NOTE   Subjective/Complaints:  Very tired today , has a runny nose but no cough  ROS-  Pt denies SOB, abd pain, CP, N/V/C/D, and vision changes   Objective:   No results found. Recent Labs    06/12/21 0519  WBC 6.0  HGB 10.4*  HCT 31.8*  PLT 291    Recent Labs    06/12/21 0519  NA 138  K 3.5  CL 109  CO2 21*  GLUCOSE 99  BUN 14  CREATININE 0.77  CALCIUM 9.2     Intake/Output Summary (Last 24 hours) at 06/14/2021 1025 Last data filed at 06/14/2021 0755 Gross per 24 hour  Intake 360 ml  Output --  Net 360 ml         Physical Exam: Vital Signs Blood pressure 119/77, pulse 97, temperature 100 F (37.8 C), temperature source Oral, resp. rate 16, height 5\' 3"  (1.6 m), weight 56.1 kg, SpO2 96 %.     General: No acute distress Mood and affect are appropriate Heart: Regular rate and rhythm no rubs murmurs or extra sounds Lungs: Clear to auscultation, breathing unlabored, no rales or wheezes, rales at Right base Abdomen: Positive bowel sounds, soft nontender to palpation, nondistended Extremities: No clubbing, cyanosis, or edema Skin: No evidence of breakdown, no evidence of rash   Extremities: No clubbing, cyanosis, or edema Skin: ecchymosis and swelling but no open areas dorsum of RIght hand Neurologic: Cranial nerves II through XII intact, motor strength is 5/5 in bilateral deltoid, bicep, tricep, grip, hip flexor, knee extensors, ankle dorsiflexor and plantar flexor  Musculoskeletal: Full range of motion in all 4 extremities. No joint swelling   Assessment/Plan: 1. Functional deficits which require 3+ hours per day of interdisciplinary therapy in a comprehensive inpatient rehab setting. Physiatrist is providing close team supervision and 24 hour management of active medical problems listed below. Physiatrist and rehab team continue to assess barriers to discharge/monitor patient  progress toward functional and medical goals  Care Tool:  Bathing    Body parts bathed by patient: Right arm, Left arm, Chest, Face, Front perineal area, Abdomen, Right upper leg, Left upper leg   Body parts bathed by helper: Buttocks     Bathing assist Assist Level: Minimal Assistance - Patient > 75%     Upper Body Dressing/Undressing Upper body dressing   What is the patient wearing?: Pull over shirt    Upper body assist Assist Level: Supervision/Verbal cueing    Lower Body Dressing/Undressing Lower body dressing      What is the patient wearing?: Pants, Underwear/pull up     Lower body assist Assist for lower body dressing: Maximal Assistance - Patient 25 - 49%     Toileting Toileting    Toileting assist Assist for toileting: Moderate Assistance - Patient 50 - 74%     Transfers Chair/bed transfer  Transfers assist     Chair/bed transfer assist level: Moderate Assistance - Patient 50 - 74%     Locomotion Ambulation   Ambulation assist      Assist level: Moderate Assistance - Patient 50 - 74% Assistive device: Walker-rolling Max distance: 28ft   Walk 10 feet activity  Assist     Assist level: Moderate Assistance - Patient - 50 - 74% Assistive device: Walker-rolling   Walk 50 feet activity   Assist Walk 50 feet with 2 turns activity did not occur: Safety/medical concerns         Walk 150 feet activity   Assist Walk 150 feet activity did not occur: Safety/medical concerns         Walk 10 feet on uneven surface  activity   Assist Walk 10 feet on uneven surfaces activity did not occur: Safety/medical concerns         Wheelchair     Assist Will patient use wheelchair at discharge?: No   Wheelchair activity did not occur: N/A         Wheelchair 50 feet with 2 turns activity    Assist    Wheelchair 50 feet with 2 turns activity did not occur: N/A       Wheelchair 150 feet activity     Assist   Wheelchair 150 feet activity did not occur: N/A       Blood pressure 119/77, pulse 97, temperature 100 F (37.8 C), temperature source Oral, resp. rate 16, height 5\' 3"  (1.6 m), weight 56.1 kg, SpO2 96 %.   Medical Problem List and Plan: 1.  B/L weakness secondary to R BG and B/L thalamic infarcts             -patient may  shower             -ELOS/Goals: 21-24 days- supervision to min A  Con't PT, OT and SLP 2.  Antithrombotics: -DVT/anticoagulation:  Pharmaceutical: Lovenox             -antiplatelet therapy: DAPT X 3 weeks followed by ASA alone.  3. Pain Management: Tylenol prn. Has migraines- cannot take Imitrex-   Controlled on topamax 100mg  BID, not using hydrocodone x 3d  4. Mood: LCSW to follow for evaluation and support.              -antipsychotic agents: N/A 5. Neuropsych: This patient is not  capable of making decisions on his own behalf. 6. Skin/Wound Care: Routine pressure relief measures.  7. Fluids/Electrolytes/Nutrition: Monitor I/O. Check lytes in am.  BMET nl 8. HTN: Monitor BP tid-- Vitals:   06/13/21 2144 06/14/21 0535  BP: 136/86 119/77  Pulse: (!) 104 97  Resp:  16  Temp: 99.6 F (37.6 C) 100 F (37.8 C)  SpO2: 100% 96%   7/18 controlled  9. CKD III: Has been incontinent since admission. Will monitor voiding with PVR checkss.             --set toileting schedule.  7/17- voiding some on toilet- but still incontinent- con't voiding/toileting schedule 10. H/o depression/anxiety: Managed with Effexor, Wellbutrin and lorazepam prn. 11. H/o Migraines: controlled on Topamax.  Has had 4x since admission- may use Nurtec prn since this is not a vasoconstrictor; increase Topamax to 100mg  BID.   7/17- will have primary ask if Nurtec appropriate? Depends on what Neuro says 12. RML nodule: Recommend repeat CT in 12 months. 13. Hyponatremia: Continue to monitor for now 14. Acute on chronic anemia of chronic disease:  Hgb 11.6 at admission-->9.9 now up to 10.6 15.  FUO: WBC trending up but likely due to steroids.             --Continue to monitor for fevers or other signs of infection. 16. Poor appetite- will monitor- might need appetite stimulant 17. Constipation  vs poor appetite- LBM 6 days ago- suggest KUB.    7/17- had 3 small to medium BM's yesterday- con't regimen for now 18.  Somnolence denies depression , no physical c/o will check UA C and S as well as CXR, CBC and BMET look good  LOS: 6 days A FACE TO FACE EVALUATION WAS PERFORMED  Charlett Blake 06/14/2021, 10:25 AM

## 2021-06-14 NOTE — Progress Notes (Signed)
Occupational Therapy Session Note  Patient Details  Name: Michaela Rios MRN: [id number] Date of Birth: 1951/07/10  Today's Date: 06/14/2021 OT Individual Time: 0938-1829 OT Individual Time Calculation (min): 31 min    Short Term Goals: Week 1:  OT Short Term Goal 1 (Week 1): Pt will don pants mod A. OT Short Term Goal 2 (Week 1): Pt will complete STS with min A + LRAD in prep for standing ADL. OT Short Term Goal 3 (Week 1): Pt will complete toilet transfer min A.  Skilled Therapeutic Interventions/Progress Updates:    Treatment session with focus on motor planning, sequencing,  LUE NMR, L attention, dynamic standing balance.  Pt received upright in w/c agreeable to therapy session.  Pt completed sit > stand with min assist.  Completed peg board pattern replication activity with focus on L attention and use of LUE.  Pt required min question cues for problem solving and sequencing, and awareness of errors.  Pt maintained standing balance with CGA.  Increased challenge to utilizing grid for increased cognitive challenge.  Pt requiring max multimodal cues for sequencing and problem solving to complete pattern replication with increased cognitive challenge with use of grid.  Pt returned to room and remained upright in w/c with seat belt alarm on and all needs in reach.  Therapy Documentation Precautions:  Precautions Precautions: Fall Precaution Comments: mild L hemiparesis Restrictions Weight Bearing Restrictions: No General:   Vital Signs: Therapy Vitals Temp: 98.5 F (36.9 C) Temp Source: Oral Pulse Rate: 90 Resp: 18 BP: 119/87 Patient Position (if appropriate): Sitting Oxygen Therapy SpO2: 100 % O2 Device: Room Air Pain: Pain Assessment Pain Scale: 0-10 Pain Score: 0-No pain    Therapy/Group: Individual Therapy  Simonne Come 06/14/2021, 2:24 PM

## 2021-06-14 NOTE — Progress Notes (Signed)
CXR clear. UA negative. CBC without rise in WBC. Note patient has not had a BM in 2 days--will schedule senna after supper daily.

## 2021-06-15 LAB — URINE CULTURE: Culture: <10000 — AB

## 2021-06-15 NOTE — Progress Notes (Addendum)
Speech Language Pathology Daily Session Note  Patient Details  Name: Michaela Rios MRN: [id number] Date of Birth: 1951/09/17  Today's Date: 06/15/2021 SLP Individual Time: 1586-8257 SLP Individual Time Calculation (min): 42 min  Short Term Goals: Week 1: SLP Short Term Goal 1 (Week 1): Patient will demonstrate selective attention to functional tasks in a mildly-to-moderately distracting environment for 20 minutes with min A cues for re-direction SLP Short Term Goal 2 (Week 1): Patient will attend to left field of environment during functional tasks with mod A cues SLP Short Term Goal 3 (Week 1): Patient will demonstrate functional problem solving for basic and mildly complex tasks with mod A cues SLP Short Term Goal 4 (Week 1): Patient will demonstrate recent recall (15 minutes or greater) of novel information with mod A verbal cues with use of compensatory strategies SLP Short Term Goal 5 (Week 1): Patient will demonstrate improved awareness by identifying and repairing errors with mod A cues.  Skilled Therapeutic Interventions: Patient agreeable to skilled ST intervention with focus on cognitive goals. SLP facilitated sustained attention, visual scanning, recall, and problem solving with novel card game. Patient completed with sup A verbal or visual cues with both basic and moderate complexity. Mod I for use of compensatory memory strategy (visual with task instructions). Patient able to sustain attention for duration of 45 minute session in quiet environment. Still exhibits difficulty with selective attention with mild distractions due to hearing sensitivity and easily distractible. Patient was passed off to OT at end of session. Continue per current plan of care.      Pain Pain Assessment Pain Scale: 0-10 Pain Score: 0-No pain  Therapy/Group: Individual Therapy  Patty Sermons, M.S., CCC-SLP   Patty Sermons 06/15/2021, 4:11 PM

## 2021-06-15 NOTE — Progress Notes (Signed)
PROGRESS NOTE   Subjective/Complaints:  No breathing issues , runny nose improved , no cough HA overall better had a short duration HA yesterday am   ROS-  Pt denies SOB, abd pain, CP, N/V/C/D, and vision changes   Objective:   DG Chest 2 View  Result Date: 06/14/2021 CLINICAL DATA:  Rales in the right chest on physical examination. EXAM: CHEST - 2 VIEW COMPARISON:  PA and lateral chest 05/24/2021. FINDINGS: The lungs are clear. Heart size is normal. Loop recorder noted. No pneumothorax or pleural fluid. No acute or focal bony abnormality. IMPRESSION: Negative chest. Electronically Signed   By: Inge Rise M.D.   On: 06/14/2021 15:01   Recent Labs    06/14/21 1004  WBC 5.7  HGB 10.4*  HCT 31.8*  PLT 305    No results for input(s): NA, K, CL, CO2, GLUCOSE, BUN, CREATININE, CALCIUM in the last 72 hours.   Intake/Output Summary (Last 24 hours) at 06/15/2021 0826 Last data filed at 06/15/2021 0816 Gross per 24 hour  Intake 480 ml  Output 69 ml  Net 411 ml         Physical Exam: Vital Signs Blood pressure (!) 138/92, pulse 93, temperature 98.9 F (37.2 C), temperature source Oral, resp. rate 14, height 5\' 3"  (1.6 m), weight 56.1 kg, SpO2 97 %.  General: No acute distress Mood and affect are appropriate Heart: Regular rate and rhythm no rubs murmurs or extra sounds Lungs: Clear to auscultation, breathing unlabored, no rales or wheezes Abdomen: Positive bowel sounds, soft nontender to palpation, nondistended  Extremities: No clubbing, cyanosis, or edema Skin: ecchymosis and swelling but no open areas dorsum of RIght hand Neurologic: Cranial nerves II through XII intact, motor strength is 5/5 in bilateral deltoid, bicep, tricep, grip, hip flexor, knee extensors, ankle dorsiflexor and plantar flexor  Musculoskeletal: Full range of motion in all 4 extremities. No joint swelling   Assessment/Plan: 1.  Functional deficits which require 3+ hours per day of interdisciplinary therapy in a comprehensive inpatient rehab setting. Physiatrist is providing close team supervision and 24 hour management of active medical problems listed below. Physiatrist and rehab team continue to assess barriers to discharge/monitor patient progress toward functional and medical goals  Care Tool:  Bathing    Body parts bathed by patient: Right arm, Left arm, Chest, Face, Front perineal area, Abdomen, Right upper leg, Left upper leg   Body parts bathed by helper: Buttocks     Bathing assist Assist Level: Minimal Assistance - Patient > 75%     Upper Body Dressing/Undressing Upper body dressing   What is the patient wearing?:  (jacket)    Upper body assist Assist Level: Supervision/Verbal cueing    Lower Body Dressing/Undressing Lower body dressing      What is the patient wearing?: Pants, Underwear/pull up     Lower body assist Assist for lower body dressing: Maximal Assistance - Patient 25 - 49%     Toileting Toileting    Toileting assist Assist for toileting: Moderate Assistance - Patient 50 - 74%     Transfers Chair/bed transfer  Transfers assist     Chair/bed transfer assist level: Minimal Assistance -  Patient > 75%     Locomotion Ambulation   Ambulation assist      Assist level: Moderate Assistance - Patient 50 - 74% Assistive device: Walker-rolling Max distance: 43ft   Walk 10 feet activity   Assist     Assist level: Moderate Assistance - Patient - 50 - 74% Assistive device: Walker-rolling   Walk 50 feet activity   Assist Walk 50 feet with 2 turns activity did not occur: Safety/medical concerns         Walk 150 feet activity   Assist Walk 150 feet activity did not occur: Safety/medical concerns         Walk 10 feet on uneven surface  activity   Assist Walk 10 feet on uneven surfaces activity did not occur: Safety/medical concerns          Wheelchair     Assist Will patient use wheelchair at discharge?: No   Wheelchair activity did not occur: N/A         Wheelchair 50 feet with 2 turns activity    Assist    Wheelchair 50 feet with 2 turns activity did not occur: N/A       Wheelchair 150 feet activity     Assist  Wheelchair 150 feet activity did not occur: N/A       Blood pressure (!) 138/92, pulse 93, temperature 98.9 F (37.2 C), temperature source Oral, resp. rate 14, height 5\' 3"  (1.6 m), weight 56.1 kg, SpO2 97 %.   Medical Problem List and Plan: 1.  B/L weakness secondary to R BG and B/L thalamic infarcts             -patient may  shower             -ELOS/Goals: 21-24 days- supervision to min A, team conf in am   Con't PT, OT and SLP 2.  Antithrombotics: -DVT/anticoagulation:  Pharmaceutical: Lovenox             -antiplatelet therapy: DAPT X 3 weeks followed by ASA alone.  3. Pain Management: Tylenol prn. Has migraines- cannot take Imitrex-   Controlled on topamax 100mg  BID, not using hydrocodone x 3d  4. Mood: LCSW to follow for evaluation and support.              -antipsychotic agents: N/A 5. Neuropsych: This patient is not  capable of making decisions on his own behalf. 6. Skin/Wound Care: Routine pressure relief measures.  7. Fluids/Electrolytes/Nutrition: Monitor I/O. Check lytes in am.  BMET nl 8. HTN: Monitor BP tid-- Vitals:   06/14/21 2042 06/15/21 0558  BP: 126/81 (!) 138/92  Pulse: 96 93  Resp: 14 14  Temp: 99.7 F (37.6 C) 98.9 F (37.2 C)  SpO2: 99% 97%   7/19 controlled  9. CKD III: Has been incontinent since admission. Will monitor voiding with PVR checkss.             --set toileting schedule.  7/17- voiding some on toilet- but still incontinent- con't voiding/toileting schedule 10. H/o depression/anxiety: Managed with Effexor, Wellbutrin and lorazepam prn. 11. H/o Migraines: controlled on Topamax.  Has had 4x since admission- may use Nurtec prn since this  is not a vasoconstrictor; increase Topamax to 100mg  BID.    12. RML nodule: Recommend repeat CT in 12 months. 13. Hyponatremia: Continue to monitor for now 14. Acute on chronic anemia of chronic disease:  Hgb 11.6 at admission-->9.9 now up to 10.6 15. FUO: WBC trending up but likely due  to steroids.             --Continue to monitor for fevers or other signs of infection. 16. Poor appetite- will monitor- might need appetite stimulant 17. Constipation vs poor appetite- LBM 6 days ago- suggest KUB.    7/17- had 3 small to medium BM's yesterday- con't regimen for now 18.  Somnolence denies depression , no physical c/o will check UA C and S as well as CXR, CBC and BMET look good  LOS: 7 days A FACE TO FACE EVALUATION WAS PERFORMED  Charlett Blake 06/15/2021, 8:26 AM

## 2021-06-15 NOTE — Progress Notes (Signed)
Occupational Therapy Session Note  Patient Details  Name: Michaela Rios MRN: [id number] Date of Birth: [birth year]  Today's Date: 06/15/2021 OT Individual Time: 0900-1000   &   1445-1528 OT Individual Time Calculation (min): 60 min   &  43 min   Short Term Goals: Week 1:  OT Short Term Goal 1 (Week 1): Pt will don pants mod A. OT Short Term Goal 2 (Week 1): Pt will complete STS with min A + LRAD in prep for standing ADL. OT Short Term Goal 3 (Week 1): Pt will complete toilet transfer min A.  Skilled Therapeutic Interventions/Progress Updates:    AM session:   Patient seated in w/c just finishing toileting with nursing.  She denies pain , she declined shower this am and states that she feels a little weak today.  Assisted to sink where she completed sponge bath with CS/CGA.  Sit to stand at sink with CGA.  Dressing completed with set up, CGA for CM in stance.  Grooming tasks with set up.  Reviewed technique for tying shoes which she was able to complete with foot propped up on surface.  She remained seated in w/c at close of session, seat belt alarm set and call bell in reach.     PM session:   Patient in bed, just finished SLP session.  She notes HA but would like to complete OT session in room.  Supine to sitting edge of bed with min A and increased time.  Reviewed techniques for forward posture and seated balance.  Completed core strengthening, seated balance, reaching, upper and lower body coordination/AROM activities.  She returned to lying in bed twice for brief rest break requiring min A for positioning and transition.  Completed supine trunk and upper body exercises.   She remained in bed at close of session, bed alarm set and callbell in hand.      Therapy Documentation Precautions:  Precautions Precautions: Fall Precaution Comments: mild L hemiparesis Restrictions Weight Bearing Restrictions: No   Therapy/Group: Individual Therapy  Michaela Rios 06/15/2021, 7:32 AM

## 2021-06-15 NOTE — Progress Notes (Signed)
Physical Therapy Session Note  Patient Details  Name: Michaela Rios MRN: [id number] Date of Birth: [birth year]  Today's Date: 06/15/2021 PT Individual Time: 1001-1103 PT Individual Time Calculation (min): 62 min   Short Term Goals: Week 1:  PT Short Term Goal 1 (Week 1): Pt will complete bed mobility MinA PT Short Term Goal 2 (Week 1): Pt will complete bed<>chair transfer MinA PT Short Term Goal 3 (Week 1): Pt will ambulate at least 24ft ModA to progression towards ambulation LTG PT Short Term Goal 4 (Week 1): Pt will complete at least 4 stairs MinA PT Short Term Goal 5 (Week 1): Pt will consistently complete sit to stands with MinA   Skilled Therapeutic Interventions/Progress Updates:    Pt received sitting in wc and agreeable to start physical therapy. Pt wheeled to day room for time management.   Wii balance board activities completed to target weight shifting and balance. Character ball/target x 2 and Penguin ice berg x2. Increased verbal cuing with manual weight shifting at the start, decreased as activity progressed. Pt demonstrated hesitancy/decreased anterior weight shifting towards toes requiring moderate manual weight shifting from therapist. Pt's score gradually increased in between rounds.    Forward stepping HHA with ladder drills down and back both feet in x1 and one foot in x1 to improve step length and anterior weight shifting with ambulation. Verbal cuing for increased step length BLE and sequencing to alternate which LE is leading. Therapist provided min-mod manual facilitation of anterior weight shifting to assist in continuous stepping and increased step length. Noted occasional freezing especially with turns that could be corrected with verbal encouragement.  Lateral stepping attempted with ladder, pt unable to safely step wide enough to complete. Transitioned to lateral stepping L/R with hand railing at wall 64ft x4. Verbal/tactile cuing for increased step width with  leading LE both directions. Pt demonstrated decreased stance time on RLE .   Pt completed gait training >239ft HHA with MinA for anterior weight shifting to promote continuous gait pattern and improve balance. Verbal cuing for posture and increased step length/height throughout training.   Frequent rest breaks throughout session secondary to fatigue.   Pt sitting in wc with seatbelt alarm on, brakes locked, call bell within reach, and tray table within reach. Therapist provided can of sprite with ice secondary to request. No further needs expressed at this time.   Therapy Documentation Precautions:  Precautions Precautions: Fall Precaution Comments: mild L hemiparesis Restrictions Weight Bearing Restrictions: No  Pain: Pain Assessment Pain Scale: 0-10 Pain Score: 0-No pain    Therapy/Group: Individual Therapy  Rolande Moe, SPT 06/15/2021, 12:37 PM

## 2021-06-16 LAB — CBC
HCT: 30.6 % — ABNORMAL LOW (ref 36.0–46.0)
Hemoglobin: 9.7 g/dL — ABNORMAL LOW (ref 12.0–15.0)
MCH: 29 pg (ref 26.0–34.0)
MCHC: 31.7 g/dL (ref 30.0–36.0)
MCV: 91.6 fL (ref 80.0–100.0)
Platelets: 328 10*3/uL (ref 150–400)
RBC: 3.34 MIL/uL — ABNORMAL LOW (ref 3.87–5.11)
RDW: 16.2 % — ABNORMAL HIGH (ref 11.5–15.5)
WBC: 4.6 10*3/uL (ref 4.0–10.5)
nRBC: 0 % (ref 0.0–0.2)

## 2021-06-16 LAB — BASIC METABOLIC PANEL
Anion gap: 8 (ref 5–15)
BUN: 11 mg/dL (ref 8–23)
CO2: 22 mmol/L (ref 22–32)
Calcium: 9.3 mg/dL (ref 8.9–10.3)
Chloride: 111 mmol/L (ref 98–111)
Creatinine, Ser: 0.87 mg/dL (ref 0.44–1.00)
GFR, Estimated: >60 mL/min (ref >60)
Glucose, Bld: 93 mg/dL (ref 70–99)
Potassium: 3.9 mmol/L (ref 3.5–5.1)
Sodium: 141 mmol/L (ref 135–145)

## 2021-06-16 MED ORDER — ZOLPIDEM TARTRATE 5 MG PO TABS
5.0000 mg | ORAL_TABLET | Freq: Every day | ORAL | Status: DC
  Administered 2021-06-16 – 2021-06-30 (×15): 5 mg via ORAL
  Filled 2021-06-16 (×15): qty 1

## 2021-06-16 NOTE — Plan of Care (Signed)
  Problem: RH Problem Solving Goal: LTG Patient will demonstrate problem solving for (SLP) Description: LTG:  Patient will demonstrate problem solving for basic/complex daily situations with cues  (SLP) Flowsheets (Taken 06/16/2021 0712) LTG Patient will demonstrate problem solving for: Supervision Note: Goals upgraded as patient progresses toward goals   Problem: RH Awareness Goal: LTG: Patient will demonstrate awareness during functional activites type of (SLP) Description: LTG: Patient will demonstrate awareness during functional activites type of (SLP) Flowsheets (Taken 06/16/2021 0712) LTG: Patient will demonstrate awareness during cognitive/linguistic activities with assistance of (SLP): Supervision

## 2021-06-16 NOTE — Progress Notes (Signed)
Physical Therapy Weekly Progress Note  Patient Details  Name: Michaela Rios MRN: [id number] Date of Birth: 12/04/1950  Beginning of progress report period: June 09, 2021 End of progress report period: June 16, 2021  Today's Date: 06/16/2021 PT Individual Time:  1104-1201  PT individual Time Calculation (min):  57 min  Patient has met 4 of 5 short term goals.  Progressing toward reaching 5th STG quickly. Pt has shown good progress since [facility]. Improvements noted in seated/ standing balance, strength, standing tolerance, overall activity tolerance.   Patient continues to demonstrate the following deficits muscle weakness, decreased cardiorespiratoy endurance, impaired timing and sequencing and decreased coordination, decreased midline orientation and decreased motor planning, decreased initiation, decreased attention, and decreased awareness, and decreased standing balance, hemiplegia, and decreased balance strategies and therefore will continue to benefit from skilled PT intervention to increase functional independence with mobility.  Patient progressing toward long term goals..  Continue plan of care.  PT Short Term Goals Week 1:  PT Short Term Goal 1 (Week 1): Pt will complete bed mobility MinA PT Short Term Goal 1 - Progress (Week 1): Met PT Short Term Goal 2 (Week 1): Pt will complete bed<>chair transfer MinA PT Short Term Goal 2 - Progress (Week 1): Met PT Short Term Goal 3 (Week 1): Pt will ambulate at least 85ft ModA to progression towards ambulation LTG PT Short Term Goal 3 - Progress (Week 1): Met PT Short Term Goal 4 (Week 1): Pt will complete at least 4 stairs MinA PT Short Term Goal 4 - Progress (Week 1): Progressing toward goal PT Short Term Goal 5 (Week 1): Pt will consistently complete sit to stands with MinA PT Short Term Goal 5 - Progress (Week 1): Met Week 2:  PT Short Term Goal 1 (Week 2): Pt will complete bed<>chair transfer with consistent CGA and no AD. PT Short  Term Goal 2 (Week 2): Pt will ambulate at least 122ft CGA to progress toward ambulation LTG PT Short Term Goal 3 (Week 2): Pt will complete at least 4 stairs MinA/ CGA. PT Short Term Goal 4 (Week 2): Pt will consistently complete sit to stands with supervision.  Skilled Therapeutic Interventions/Progress Updates:  Patient seated upright in w/c on entrance to room. Patient alert and agreeable to PT session. Patient with no pain complaint throughout session, but relates fatigue from lack of sleep the previous evening.  Therapeutic Activity: Transfers: Patient performed STS and SPVT transfers throughout session with CGA/ supervision. Provided verbal cues for maintaining midline in rise/ descent, reaching back for armrests to improve balance. Vc/ tc throughout pivot stepping for taking time and not rushing to get to seat.  Gait Training/ NMR:  Patient ambulated 74' x2 using no AD with CGA and intermittent Min A . Pt also guided in fwd stepping and lateral stepping through agility ladder completing 2 passes each. Progressed to high knee stepping to increase time spent in single leg stance for improved balance. Demos intermittent minor LOB requiring light Min A to maintain with add'l good stepping strategy demo'd by pt. Provided vc/ tc throughout for attaining balance prior to next step, widening lateral step to improve spacing of steps.  Neuromuscular Re-ed: NMR facilitated during session with focus onstanding balance, motor control, muscle activation. Pt guided in four square stepping in CW and CCW directions. Guided in balance training using agility ladder, see section above. NMR performed for improvements in motor control and coordination, balance, sequencing, judgement, and self confidence/ efficacy in performing all aspects  of mobility at highest level of independence.   Patient supine  in bed at end of session with brakes locked, bed alarm set, and all needs within reach.     Therapy  Documentation Precautions:  Precautions Precautions: Fall Precaution Comments: mild L hemiparesis Restrictions Weight Bearing Restrictions: No  Therapy/Group: Individual Therapy  Alger Simons 06/16/2021, 1:01 PM

## 2021-06-16 NOTE — Progress Notes (Signed)
Speech Language Pathology Weekly Progress and Session Note  Patient Details  Name: Michaela Rios MRN: [id number] Date of Birth: 1951-02-20  Beginning of progress report period: June 09, 2021 End of progress report period: June 16, 2021  Today's Date: 06/16/2021 SLP Individual Time: 6503-5465 SLP Individual Time Calculation (min): 45 min  Short Term Goals: Week 1: SLP Short Term Goal 1 (Week 1): Patient will demonstrate selective attention to functional tasks in a mildly-to-moderately distracting environment for 20 minutes with min A cues for re-direction SLP Short Term Goal 1 - Progress (Week 1): Not met SLP Short Term Goal 2 (Week 1): Patient will attend to left field of environment during functional tasks with mod A cues SLP Short Term Goal 2 - Progress (Week 1): Met SLP Short Term Goal 3 (Week 1): Patient will demonstrate functional problem solving for basic and mildly complex tasks with mod A cues SLP Short Term Goal 3 - Progress (Week 1): Met SLP Short Term Goal 4 (Week 1): Patient will demonstrate recent recall (15 minutes or greater) of novel information with mod A verbal cues with use of compensatory strategies SLP Short Term Goal 4 - Progress (Week 1): Met SLP Short Term Goal 5 (Week 1): Patient will demonstrate improved awareness by identifying and repairing errors with mod A cues. SLP Short Term Goal 5 - Progress (Week 1): Met    New Short Term Goals: Week 2: SLP Short Term Goal 1 (Week 2): Patient will demonstrate selective attention to functional tasks in a mildly-to-moderately distracting environment for 20 minutes with min A cues for re-direction SLP Short Term Goal 2 (Week 2): Patient will attend to left field of environment during functional tasks with sup A cues SLP Short Term Goal 3 (Week 2): Patient will demonstrate functional problem solving for complex tasks with min-sup A cues SLP Short Term Goal 4 (Week 2): Patient will demonstrate recent recall (20 minutes or  greater) of novel information with min A verbal cues with use of compensatory strategies SLP Short Term Goal 5 (Week 2): Patient will demonstrate improved awareness by identifying and repairing errors with sup A cues.  Weekly Progress Updates: Patient has met 4 of 5 short term goals. Patient has made consistent progress with ST over the past week. She is currently completing cognitive-linguistic tasks with sup A verbal and visual cues for problem solving, reasoning, sustained attention in quiet environments, medication/money management. She is min A for short-term recall and using compensatory memory strategies. Education on strategies ongoing. Patient continues to benefit from performing cognitive-linguistic treatments in quiet, minimally distracting environments as patient states she become easily distracted by background noise. Have not been able to explicitly address selective attention in mildly distracting environment due to patient preferences and possibility of increased frustration which has ben displayed during a previous sessions when SLP intentionally set up environment with mild distractions despite education/explanation. Patient/family education is ongoing. Recommend continued skilled ST intervention to maximize cognitive function and functional independence prior to discharge. Continue progression toward established LTGs set at sup-to-min A assist level overall.    Intensity: Minumum of 1-2 x/day, 30 to 90 minutes Frequency: 3 to 5 out of 7 days Duration/Length of Stay: 21-24 Treatment/Interventions: Cognitive remediation/compensation;Environmental controls;Internal/external aids;Cueing hierarchy;Functional tasks;Medication managment;Patient/family education;Therapeutic Activities  Daily Session Skilled Therapeutic Interventions: Patient agreeable to skilled ST intervention with focus on cognitive goals. SLP assisted patient with ADLs (i.e., toileting, washing hands, upper/body dressing at  bed level, and brushing hair at sink) with sup A verbal  cues for initiation and attention to left. SLP facilitated basic working memory and reasoning task involving retention of 4-5 word list and answering questions about various attributes Patient completed with sup A verbal cues for implementing verbal repetition of word list. Session completed in quiet environment to minimize distractions, as patient endorsed mild frustration with external stimuli. Patient was left in bed with alarm activated and immediate needs within reach at end of session. Continue per current plan of care.     General    Pain Pain Assessment Pain Scale: 0-10 Pain Score: 0-No pain  Therapy/Group: Individual Therapy Patty Sermons, M.S., CCC-SLP   Patty Sermons 06/16/2021, 8:32 AM

## 2021-06-16 NOTE — Progress Notes (Signed)
Occupational Therapy Weekly Progress Note  Patient Details  Name: Michaela Rios MRN: [id number] Date of Birth: 29-May-1951  Beginning of progress report period: June 09, 2021 End of progress report period: June 16, 2021  Today's Date: 06/16/2021 OT Individual Time: 1093-2355 OT Individual Time Calculation (min): 53 min + 43 min   Patient has met 3 of 3 short term goals.  Pt has made steady progress this week in OT. Pt has demonstrated improved dynamic sitting and standing balance, activity tolerance, initiation and attention to tasks, functional BUE strength, and ability to compensate for deficits to presently complete UB ADL at S level while seated level, LBD at mod A, bathing at min A, toilet t/fs at min A, and shower transfers at mod A.. Pt cont to be primarily limited by decreased dynamic standing balance and motor planning. LUE and hand currently assessed at Brummstrom level IV and V respectively. Anticipate intermittant S and CGA physical assist required upon DC.   Patient continues to demonstrate the following deficits: muscle weakness, decreased cardiorespiratoy endurance, impaired timing and sequencing, decreased coordination, and decreased motor planning, decreased attention to left and decreased motor planning, decreased initiation, decreased attention, decreased awareness, decreased problem solving, decreased safety awareness, decreased memory, and delayed processing, and decreased sitting balance, decreased standing balance, decreased postural control, hemiplegia, and decreased balance strategies and therefore will continue to benefit from skilled OT intervention to enhance overall performance with BADL, iADL, and Reduce care partner burden.  Patient progressing toward long term goals..  Continue plan of care.  OT Short Term Goals Week 2:  OT Short Term Goal 1 (Week 2): Pt will don pants with min A. OT Short Term Goal 2 (Week 2): Pt will complete 2/3 steps of toileting with  CGA. OT Short Term Goal 3 (Week 2): Pt will complete ambulatory toilet transfer min A + LRAD.  Skilled Therapeutic Interventions/Progress Updates:    Session 1 [phone]): Pt received semi-reclined in bed with RN present for rx pass, agreeable to therapy. Denies pain throughout session. Session focus on dynamic standing balance, func cog, activity tolerance, LUE NMR, and energy conservation in prep for improved ADL/IADL/func mobility performance + decreased caregiver burden. Came to sitting EOB with close S and increased time. Amb transfer to w/c with CGA for balance with RW. Total A w/c transport to and from gym 2/2 time management and energy conservation. Stood to remove and match playing cards at head and B knee height. Req CGA overall for balance but improved posterior bias noted this session. Req increased time to manipulate and match playing cards with min Vcs for accuracy while using LUE. Stood to complete the following BUE exercises with 1 lb dowel rod: forward/backward rows, chest and shoulder press with CGA for balance. Amb transfer back to recliner with min Vcs to use RW and CGA.   Pt left in w/c with safety belt alarm engaged, call bell in reach, and all immediate needs met.    Session 2 [phone]): Pt received seated in w/c with husband and SW present, denies pain, agreeable to therapy. Session focus on self-care retraining, activity tolerance, energy conservation in prep for improved ADL/IADL/func mobility performance + decreased caregiver burden. Donned B shoes close S via figure four. STS and amb to and from toilet with CGA to min A with RW, Vcs for safe RW management and for turning prior to sitting down. No void and min A for LB clothing management. Amb to w/c, sat to brush teeth for energy conservation.  Req A to set-up tooth brush and problem solve use of cups to rinse and spit. Completed oral care close S.  Amb transfer ~ 10 ft to Nustep - completed first 2 min 30 secs then 2 min at  level 6 resistance and average step/min of 25. Reports 5/10 post activity on mod RPE. Pt noted to req mod Vcs this date to place L foot on L foot rest.   Pt left seated in w/c with safety belt alarm engaged, call bell in reach, and all immediate needs met.    Therapy Documentation Precautions:  Precautions Precautions: Fall Precaution Comments: mild L hemiparesis Restrictions Weight Bearing Restrictions: No  Pain: see session notes   ADL: See Care Tool for more details.   Therapy/Group: Individual Therapy  Volanda Napoleon MS, OTR/L  06/16/2021, 6:34 AM

## 2021-06-16 NOTE — Patient Care Conference (Signed)
Inpatient RehabilitationTeam Conference and Plan of Care Update Date: 06/16/2021   Time: 10:21 AM    Patient Name: Michaela Rios      Medical Record Number: [id number]  Date of Birth: Mar 06, 1951 Sex: Female         Room/Bed: 4W24C/4W24C-01 Payor Info: Payor: [employer] MEDICARE / Plan: [place] MEDICARE HMO/PPO / Product Type: *No Product type* /    Admit Date/Time:  06/08/2021  5:45 PM  Primary Diagnosis:  Embolic stroke of right basal ganglia [facility])  Hospital Problems: Principal Problem:   Embolic stroke of right basal ganglia [facility]) Active Problems:   Thalamic stroke [facility])    Expected Discharge Date: Expected Discharge Date: 07/01/21  Team Members Present: Physician leading conference: Dr. Alysia Penna Care Coodinator Present: Erlene Quan, BSW;Elchanan Bob Hervey Ard, RN, BSN, [place] Nurse Present: Dorien Chihuahua, RN PT Present: Other (comment) Filbert Berthold, PT) OT Present: Other (comment) Providence Lanius, OT) SLP Present: Other (comment) Annice Needy, SLP)     Current Status/Progress Goal Weekly Team Focus  Bowel/Bladder   cont of B/B, occasional incont of bladder. Last BM-7/19  gain full cont.  assess q shift and PRN   Swallow/Nutrition/ Hydration             ADL's   S for UBD, grooming in seated, min A toilet t/f via stand-pivot, mod toileting tasks, max LBD, min for full-body bathing seated at shower level  S for transfers and ADL  LUE NMR, balance/self-care/transfer retraining, pt/family education, activity tolerance, energy conservation, AE/DME education   Mobility   Bed mobility = Min A, Transfers = Min A, Gait = Min/ Mod A for balance  Overall SUP  L hemibody NMR, standing balance training, reduced LOA for bed mobility transfers and gait, improved quality of gait, increased activity tolerance, initiate stair training   Communication             Safety/Cognition/ Behavioral Observations  Min A  Sup for problem solving and awareness; Min-to-mod A for recall and  attention  medication/money management, complex problem solving, short-term recall, selective attention. Easily distractible to environmental distractors   Pain   c/o headache pain; med effective  pain<3  assess pain q shift and PRN   Skin   intact  free of breakdown  assess q shift and PRN     Discharge Planning:  Discharging home with sister 24/7 supervision- Min A, prefers not to assist with toileting task.   Team Discussion: Patient with poor appetite and insomnia. Md adjusting meds and added sleep chart. Patient on target to meet rehab goals: yes, currently supervision for upper body bathing and dressing and mod - max assist for lower body dressing. Requires mod assist to get in/out of the shower. Requires min assist for transfers, min-mod assist to ambulate and Mod assist for toileting. Supervision goals set for discharge.  *See Care Plan and progress notes for long and short-term goals.   Revisions to Treatment Plan:  Working on balance, problem solving, recall, money and medication management, selective attention and requires mild instructions for activities as she is easily distracted  Teaching Needs: Transfers, toileting, safety, secondary stroke risk management, medications, etc.  Current Barriers to Discharge: Decreased caregiver support and Home enviroment access/layout  Possible Resolutions to Barriers: Family education     Medical Summary Current Status: Insomnia , poor appetite, incont  Barriers to Discharge: Medical stability   Possible Resolutions to Barriers/Weekly Focus: improve toileting given caregiver reluctance to assist, med management for insomnia, and posibly appetite  Continued Need for Acute Rehabilitation Level of Care: The patient requires daily medical management by a physician with specialized training in physical medicine and rehabilitation for the following reasons: Direction of a multidisciplinary physical rehabilitation program to maximize  functional independence : Yes Medical management of patient stability for increased activity during participation in an intensive rehabilitation regime.: Yes Analysis of laboratory values and/or radiology reports with any subsequent need for medication adjustment and/or medical intervention. : Yes   I attest that I was present, lead the team conference, and concur with the assessment and plan of the team.   Margarito Liner 06/16/2021, 12:34 PM

## 2021-06-16 NOTE — Progress Notes (Signed)
Patient ID: Michaela Rios, female   DOB: 12/17/1950, 70 y.o.   MRN: [id number] Team Conference Report to Patient/Family  Team Conference discussion was reviewed with the patient and caregiver, including goals, any changes in plan of care and target discharge date.  Patient and caregiver express understanding and are in agreement.  The patient has a target discharge date of 07/01/21.  SW met with patient and spouse in patient room. Provided pt and spouse with conference updates. Patient reports sleep and migraines have improved. SW will continue to follow up with questions and concerns.   Dyanne Iha 06/16/2021, 1:11 PM

## 2021-06-16 NOTE — Progress Notes (Signed)
PROGRESS NOTE   Subjective/Complaints:  Reviewed labs, per SLP did better with memory task today  ROS-  Pt denies SOB, abd pain, CP, N/V/C/D, and vision changes   Objective:   DG Chest 2 View  Result Date: 06/14/2021 CLINICAL DATA:  Rales in the right chest on physical examination. EXAM: CHEST - 2 VIEW COMPARISON:  PA and lateral chest 05/24/2021. FINDINGS: The lungs are clear. Heart size is normal. Loop recorder noted. No pneumothorax or pleural fluid. No acute or focal bony abnormality. IMPRESSION: Negative chest. Electronically Signed   By: Inge Rise M.D.   On: 06/14/2021 15:01   Recent Labs    06/14/21 1004 06/16/21 0459  WBC 5.7 4.6  HGB 10.4* 9.7*  HCT 31.8* 30.6*  PLT 305 328    Recent Labs    06/16/21 0459  NA 141  K 3.9  CL 111  CO2 22  GLUCOSE 93  BUN 11  CREATININE 0.87  CALCIUM 9.3     Intake/Output Summary (Last 24 hours) at 06/16/2021 0814 Last data filed at 06/16/2021 0721 Gross per 24 hour  Intake 470 ml  Output 1 ml  Net 469 ml         Physical Exam: Vital Signs Blood pressure (!) 146/93, pulse 90, temperature 98.3 F (36.8 C), temperature source Oral, resp. rate 16, height 5' 3" (1.6 m), weight 56.1 kg, SpO2 99 %.  General: No acute distress Mood and affect are appropriate Heart: Regular rate and rhythm no rubs murmurs or extra sounds Lungs: Clear to auscultation, breathing unlabored, no rales or wheezes Abdomen: Positive bowel sounds, soft nontender to palpation, nondistended Extremities: No clubbing, cyanosis, or edema Skin: No evidence of breakdown, no evidence of ras   Extremities: No clubbing, cyanosis, or edema Skin: ecchymosis and swelling but no open areas dorsum of RIght hand Neurologic: Cranial nerves II through XII intact, motor strength is 5/5 in bilateral deltoid, bicep, tricep, grip, hip flexor, knee extensors, ankle dorsiflexor and plantar  flexor  Musculoskeletal: Full range of motion in all 4 extremities. No joint swelling   Assessment/Plan: 1. Functional deficits which require 3+ hours per day of interdisciplinary therapy in a comprehensive inpatient rehab setting. Physiatrist is providing close team supervision and 24 hour management of active medical problems listed below. Physiatrist and rehab team continue to assess barriers to discharge/monitor patient progress toward functional and medical goals  Care Tool:  Bathing    Body parts bathed by patient: Right arm, Left arm, Chest, Face, Front perineal area, Abdomen, Right upper leg, Left upper leg   Body parts bathed by helper: Buttocks     Bathing assist Assist Level: Minimal Assistance - Patient > 75%     Upper Body Dressing/Undressing Upper body dressing   What is the patient wearing?:  (jacket)    Upper body assist Assist Level: Supervision/Verbal cueing    Lower Body Dressing/Undressing Lower body dressing      What is the patient wearing?: Pants, Underwear/pull up     Lower body assist Assist for lower body dressing: Maximal Assistance - Patient 25 - 49%     Toileting Toileting    Toileting assist Assist for toileting: Moderate  Assistance - Patient 50 - 74%     Transfers Chair/bed transfer  Transfers assist     Chair/bed transfer assist level: Minimal Assistance - Patient > 75%     Locomotion Ambulation   Ambulation assist      Assist level: Moderate Assistance - Patient 50 - 74% Assistive device: Walker-rolling Max distance: 70f   Walk 10 feet activity   Assist     Assist level: Moderate Assistance - Patient - 50 - 74% Assistive device: Walker-rolling   Walk 50 feet activity   Assist Walk 50 feet with 2 turns activity did not occur: Safety/medical concerns         Walk 150 feet activity   Assist Walk 150 feet activity did not occur: Safety/medical concerns         Walk 10 feet on uneven surface   activity   Assist Walk 10 feet on uneven surfaces activity did not occur: Safety/medical concerns         Wheelchair     Assist Will patient use wheelchair at discharge?: No   Wheelchair activity did not occur: N/A         Wheelchair 50 feet with 2 turns activity    Assist    Wheelchair 50 feet with 2 turns activity did not occur: N/A       Wheelchair 150 feet activity     Assist  Wheelchair 150 feet activity did not occur: N/A       Blood pressure (!) 146/93, pulse 90, temperature 98.3 F (36.8 C), temperature source Oral, resp. rate 16, height 5' 3" (1.6 m), weight 56.1 kg, SpO2 99 %.   Medical Problem List and Plan: 1.  B/L weakness secondary to R BG and B/L thalamic infarcts             -patient may  shower             -ELOS/Goals: 21-24 days- supervision to min A, Team conference today please see physician documentation under team conference tab, met with team  to discuss problems,progress, and goals. Formulized individual treatment plan based on medical history, underlying problem and comorbidities.   Con't PT, OT and SLP 2.  Antithrombotics: -DVT/anticoagulation:  Pharmaceutical: Lovenox             -antiplatelet therapy: DAPT X 3 weeks followed by ASA alone.  3. Pain Management: Tylenol prn. Has migraines- cannot take Imitrex-   Controlled on topamax 1091mBID, not using hydrocodone, will not need on D/C 4. Mood: LCSW to follow for evaluation and support.              -antipsychotic agents: N/A 5. Neuropsych: This patient is not  capable of making decisions on his own behalf. 6. Skin/Wound Care: Routine pressure relief measures.  7. Fluids/Electrolytes/Nutrition: Monitor I/O. Check lytes in am.  BMET nl 8. HTN: Monitor BP tid-- Vitals:   06/15/21 1928 06/16/21 0448  BP: 114/81 (!) 146/93  Pulse: 87 90  Resp: 18 16  Temp: 97.8 F (36.6 C) 98.3 F (36.8 C)  SpO2: 97% 99%   7/20 controlled  9. CKD III: Has been incontinent since  admission. Will monitor voiding with PVR checkss.             --set toileting schedule.  7/17- voiding some on toilet- but still incontinent- con't voiding/toileting schedule 10. H/o depression/anxiety: Managed with Effexor, Wellbutrin and lorazepam prn. 11. H/o Migraines: controlled on Topamax.  Has had 4x since admission- may use  Nurtec prn since this is not a vasoconstrictor; increase Topamax to 158m BID.    12. RML nodule: Recommend repeat CT in 12 months. 13. Hyponatremia: Continue to monitor for now 14. Acute on chronic anemia of chronic disease:  Hgb 11.6 at admission-->9.9 now up to 10.6 15. FUO: WBC trending up but likely due to steroids.             --Continue to monitor for fevers or other signs of infection. 16. Poor appetite- will monitor- might need appetite stimulant 17. Constipation vs poor appetite- LBM 6 days ago- suggest KUB.    7/17- had 3 small to medium BM's yesterday- con't regimen for now 18.  Somnolence , likely CVA vs med related denies depression ,normalUA C and S as well as CXR, CBC and BMET- pt with poor sleep has prn ambien but not using will schedule  LOS: 8 days A FACE TO FACE EVALUATION WAS PERFORMED  ACharlett Blake7/20/2022, 8:14 AM

## 2021-06-17 ENCOUNTER — Telehealth: Payer: Self-pay

## 2021-06-17 NOTE — Telephone Encounter (Signed)
The patient was implanted on 06-08-2021 and her phone app has not transmitted since she was implanted. The patient states she is still in the hospital and will not be released until 07-01-2021. She has a wound check on 06-23-2021.  Her phone monitor is at home. I told her I will let the nurses know and I will check with the Apps to see if they can do her wound check while she is at the hospital.

## 2021-06-17 NOTE — Progress Notes (Signed)
Occupational Therapy Session Note  Patient Details  Name: Michaela Rios MRN: [id number] Date of Birth: Oct 20, 1951  Today's Date: 06/17/2021 OT Individual Time: 1188-6773 OT Individual Time Calculation (min): 58 min + 41 min   Short Term Goals: Week 2:  OT Short Term Goal 1 (Week 2): Pt will don pants with min A. OT Short Term Goal 2 (Week 2): Pt will complete 2/3 steps of toileting with CGA. OT Short Term Goal 3 (Week 2): Pt will complete ambulatory toilet transfer min A + LRAD.  Skilled Therapeutic Interventions/Progress Updates:    Session 1 [phone]): Pt received semi-reclined in bed, denies pain but reports migraine last night and endorses poor sleep, agreeable to therapy. Session focus on self-care retraining, activity tolerance, func transfers, energy conservation, func cog in prep for improved ADL/IADL/func mobility performance + decreased caregiver burden. Came to sitting EOB with close S and increased time, stand-pivot to w/c with min A for balance. Req to get washed up at sink. Pt able to pick out clothes while seated with increased time and close S. Completed UBB/UBD + grooming seated at sink with distant S + increased time. Pt cont to be easily distracted with conversation, but noted improvement. Did ind state that [name] and attention have been impacted. Discussed external aids to assist with this including to-do lists, decreasing environmental distractions, etc. Additionally, discussed energy conservation management strategies, states she has a chair that should be able to fit in bathroom if needed. MD in/out for morning assessment. Amb to toilet with min A for balance and safe RW use. CGA for LB clothing management and pericare. Donned new brief with min A to thread LLE, poor carryover of hemi-dressing from previous sessions. Able to don pants with CGA. Amb back to w/c at sink and completed oral care with close S.   Pt left seated in w/c with safety belt alarm engaged, RN  present, call bell in reach, and all immediate needs met.    Session 2 [phone]) : Pt received semi-reclined in bed, no c/o pain, agreeable to therapy. Session focus on self-care retraining, activity tolerance, func cog, attention to task, BUE NMR, dynamic standing balance in prep for improved ADL/IADL/func mobility performance + decreased caregiver burden. Came to sitting EOB close S + increased time. Donned B shoes min A to put L heel in shoe and CGA for balance 2/2 posterior lean. Stand-pivot with min HHA to w/c. Total A w/c transport to and from gym 2/2 time management and energy conservation. Stood at [employer] to complete trail making part B in 12 min 34 secs with 33 errors/96 interruptions. Req mod Vcs to complete accurately + attention to task and noted B hand shakiness with R hand worse than L. In room, amb transfer > toilet > bed with min A for balance and safe RW management. CGA for LB clothing management and standing pericare post continent void of bladder. Doffed shoes and returned to supine close S.    Pt left semi-reclined in bed with husband present, bed alarm engaged, call bell in reach, and all immediate needs met.    Therapy Documentation Precautions:  Precautions Precautions: Fall Precaution Comments: mild L hemiparesis Restrictions Weight Bearing Restrictions: No  Pain: see session notes   ADL: See Care Tool for more details.  Therapy/Group: Individual Therapy  Volanda Napoleon MS, OTR/L  06/17/2021, 6:45 AM

## 2021-06-17 NOTE — Progress Notes (Signed)
PROGRESS NOTE   Subjective/Complaints:  Had a migraine yesterday 6p lasting >1 h, slept a bit better yesterday, takes ambien at home   ROS-  Pt denies SOB, abd pain, CP, N/V/C/D, and vision changes   Objective:   No results found. Recent Labs    06/14/21 1004 06/16/21 0459  WBC 5.7 4.6  HGB 10.4* 9.7*  HCT 31.8* 30.6*  PLT 305 328    Recent Labs    06/16/21 0459  NA 141  K 3.9  CL 111  CO2 22  GLUCOSE 93  BUN 11  CREATININE 0.87  CALCIUM 9.3     Intake/Output Summary (Last 24 hours) at 06/17/2021 0141 Last data filed at 06/17/2021 0809 Gross per 24 hour  Intake 480 ml  Output --  Net 480 ml         Physical Exam: Vital Signs Blood pressure 135/79, pulse 91, temperature 98.6 F (37 C), resp. rate 18, height 5\' 3"  (1.6 m), weight 56.1 kg, SpO2 98 %.   General: No acute distress Mood and affect are appropriate Heart: Regular rate and rhythm no rubs murmurs or extra sounds Lungs: Clear to auscultation, breathing unlabored, no rales or wheezes Abd:  Non distended , non tender, nl BS   Extremities: No clubbing, cyanosis, or edema Skin: ecchymosis and swelling but no open areas dorsum of RIght hand Neurologic: Cranial nerves II through XII intact, motor strength is 5/5 in bilateral deltoid, bicep, tricep, grip, hip flexor, knee extensors, ankle dorsiflexor and plantar flexor  Musculoskeletal: Full range of motion in all 4 extremities. No joint swelling   Assessment/Plan: 1. Functional deficits which require 3+ hours per day of interdisciplinary therapy in a comprehensive inpatient rehab setting. Physiatrist is providing close team supervision and 24 hour management of active medical problems listed below. Physiatrist and rehab team continue to assess barriers to discharge/monitor patient progress toward functional and medical goals  Care Tool:  Bathing    Body parts bathed by patient: Right  arm, Left arm, Chest, Face, Front perineal area, Abdomen, Right upper leg, Left upper leg   Body parts bathed by helper: Buttocks     Bathing assist Assist Level: Minimal Assistance - Patient > 75%     Upper Body Dressing/Undressing Upper body dressing   What is the patient wearing?:  (jacket)    Upper body assist Assist Level: Supervision/Verbal cueing    Lower Body Dressing/Undressing Lower body dressing      What is the patient wearing?: Pants, Underwear/pull up     Lower body assist Assist for lower body dressing: Maximal Assistance - Patient 25 - 49%     Toileting Toileting    Toileting assist Assist for toileting: Minimal Assistance - Patient > 75%     Transfers Chair/bed transfer  Transfers assist     Chair/bed transfer assist level: Minimal Assistance - Patient > 75%     Locomotion Ambulation   Ambulation assist      Assist level: Moderate Assistance - Patient 50 - 74% Assistive device: Walker-rolling Max distance: 75ft   Walk 10 feet activity   Assist     Assist level: Moderate Assistance - Patient - 73 -  74% Assistive device: Walker-rolling   Walk 50 feet activity   Assist Walk 50 feet with 2 turns activity did not occur: Safety/medical concerns         Walk 150 feet activity   Assist Walk 150 feet activity did not occur: Safety/medical concerns         Walk 10 feet on uneven surface  activity   Assist Walk 10 feet on uneven surfaces activity did not occur: Safety/medical concerns         Wheelchair     Assist Will patient use wheelchair at discharge?: No   Wheelchair activity did not occur: N/A         Wheelchair 50 feet with 2 turns activity    Assist    Wheelchair 50 feet with 2 turns activity did not occur: N/A       Wheelchair 150 feet activity     Assist  Wheelchair 150 feet activity did not occur: N/A       Blood pressure 135/79, pulse 91, temperature 98.6 F (37 C), resp. rate  18, height 5\' 3"  (1.6 m), weight 56.1 kg, SpO2 98 %.   Medical Problem List and Plan: 1.  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  Antithrombotics: -DVT/anticoagulation:  Pharmaceutical: Lovenox             -antiplatelet therapy: DAPT X 3 weeks followed by ASA alone.  3. Pain Management: Tylenol prn. Has migraines- cannot take Imitrex- generally controlled but had one ~6p yesterday that lasted >1h- monitor on current meds   Controlled on topamax 100mg  BID, not using hydrocodone, will not need on D/C 4. Mood: LCSW to follow for evaluation and support.              -antipsychotic agents: N/A 5. Neuropsych: This patient is not  capable of making decisions on his own behalf. 6. Skin/Wound Care: Routine pressure relief measures.  7. Fluids/Electrolytes/Nutrition: Monitor I/O. Check lytes in am.  BMET nl 8. HTN: Monitor BP tid-- Vitals:   06/16/21 1948 06/17/21 0531  BP: 126/84 135/79  Pulse: 95 91  Resp: 20 18  Temp: 99.1 F (37.3 C) 98.6 F (37 C)  SpO2: 98% 98%   7/21 controlled  9. CKD III: Has been incontinent since admission. Will monitor voiding with PVR checkss.             --set toileting schedule.  7/17- voiding some on toilet- but still incontinent- con't voiding/toileting schedule 10. H/o depression/anxiety: Managed with Effexor, Wellbutrin and lorazepam prn. 11. H/o Migraines: controlled on Topamax.  Has had 4x since admission- may use Nurtec prn since this is not a vasoconstrictor; increase Topamax to 100mg  BID.    12. RML nodule: Recommend repeat CT in 12 months. 13. Hyponatremia: Continue to monitor for now 14. Acute on chronic anemia of chronic disease:  Hgb 11.6 at admission-->9.9 now up to 10.6 15. FUO: WBC trending up but likely due to steroids.             --Continue to monitor for fevers or other signs of infection. 16. Poor  appetite- will monitor- might need appetite stimulant 17. Constipation vs poor appetite- LBM 6 days ago- suggest KUB.    7/17- had  3 small to medium BM's yesterday- con't regimen for now 18.  Somnolence , likely CVA vs med related denies depression ,normalUA C and S as well as CXR, CBC and BMET- pt with poor sleep has prn ambien but not using will schedule  LOS: 9 days A FACE TO FACE EVALUATION WAS PERFORMED  Charlett Blake 06/17/2021, 8:12 AM

## 2021-06-17 NOTE — Progress Notes (Signed)
Speech Language Pathology Daily Session Note  Patient Details  Name: Michaela Rios MRN: [id number] Date of Birth: [birth year]  Today's Date: 06/17/2021 SLP Individual Time: 1300-1400 SLP Individual Time Calculation (min): 60 min  Short Term Goals: Week 2: SLP Short Term Goal 1 (Week 2): Patient will demonstrate selective attention to functional tasks in a mildly-to-moderately distracting environment for 20 minutes with min A cues for re-direction SLP Short Term Goal 2 (Week 2): Patient will attend to left field of environment during functional tasks with sup A cues SLP Short Term Goal 3 (Week 2): Patient will demonstrate functional problem solving for complex tasks with min-sup A cues SLP Short Term Goal 4 (Week 2): Patient will demonstrate recent recall (20 minutes or greater) of novel information with min A verbal cues with use of compensatory strategies SLP Short Term Goal 5 (Week 2): Patient will demonstrate improved awareness by identifying and repairing errors with sup A cues.  Skilled Therapeutic Interventions: Patient agreeable to skilled ST intervention with focus on cognitive goals. Facilitated use of internal memory strategies with memorization of a daily to-do list. Patient was able to successfully recall 9/9 items with min A semantic cues after 15 minute delay using chunking strategy and repetition. Facilitated visual memory task with spatial location of 8 items in a garden plot. Patient able to recall 8/8 items successfully after 1 minute delay and after 10 minute delay. IND with left visual scanning. Patient was passed off to OT at end of session. Continue per current plan of care.      Pain Pain Assessment Pain Scale: 0-10 Pain Score: 0-No pain  Therapy/Group: Individual Therapy  Patty Sermons 06/17/2021, 3:38 PM

## 2021-06-17 NOTE — Progress Notes (Signed)
Physical Therapy Session Note  Patient Details  Name: Michaela Rios MRN: [id number] Date of Birth: 09/02/1951  Today's Date: 06/17/2021 PT Individual Time: 1005-1102 PT Individual Time Calculation (min): 57 min   Short Term Goals: Week 2:  PT Short Term Goal 1 (Week 2): Pt will complete bed<>chair transfer with consistent CGA and no AD. PT Short Term Goal 2 (Week 2): Pt will ambulate at least 160f CGA to progress toward ambulation LTG PT Short Term Goal 3 (Week 2): Pt will complete at least 4 stairs MinA/ CGA. PT Short Term Goal 4 (Week 2): Pt will consistently complete sit to stands with supervision.  Skilled Therapeutic Interventions/Progress Updates:  Patient seated upright in w/c on entrance to room. Patient alert and agreeable to PT session, but does state fatigue from lack of sleep the previous evening. Patient with no pain complaint throughout session.  Therapeutic Activity: Bed Mobility: At end of session performed sit-->supine with supervision. VC required for technique. Transfers: Patient performed STS throughout session with supervision and focus on performance with hands on thighs. SPVT transfers throughout session with supervision/ CGA with focus on performance and slowing pivot stepping. Good SPVT at end of session with use of bedrail for UE support and purposeful pivot steps not rushed.Provided verbal cues for increasing step height/ length.  Gait Training:  Patient guided in stair training  and provided with verbal instructions with visual demonstration re: leading LE to ascend/ descend and progressing hand placement. Pt performed using BHR and CGA with consistent vc throughout for clearing steps with foot, and maintaining strong knee extension with WB to LLE.   Patient ambulated 880 x1 and 120' x1 using no AD with CGA/ one instance of Min A. Demonstrated improved step length and weight shift bilaterally. One misstep with pt demonstrating step strategy with crossover step  and Min A to maintain balance. Provided vc/ tc for improving quality of gait throughout.  Neuromuscular Re-ed: NMR facilitated during session with focus on standing balance, dynamic movements, motor control. Pt guided in lateral and forward lunges with HHA on therapist's forearms. Standing coordination exercise with star drill touches to colored discs on floor. Difficulty with Bil cross body stepping. VC to slow movements, attain balance first, weight shift prior to reach with LE. NMR performed for improvements in motor control and coordination, balance, sequencing, judgement, and self confidence/ efficacy in performing all aspects of mobility at highest level of independence.   Patient supine  in bed at end of session with brakes locked, bed alarm set, and all needs within reach. Oriented to time and start of next therapy session.     Therapy Documentation Precautions:  Precautions Precautions: Fall Precaution Comments: mild L hemiparesis Restrictions Weight Bearing Restrictions: No  Therapy/Group: Individual Therapy  JAlger SimonsPT, DPT 06/17/2021, 5:18 PM

## 2021-06-18 NOTE — Progress Notes (Signed)
Occupational Therapy Session Note  Patient Details  Name: Michaela Rios MRN: [id number] Date of Birth: [birth year]  Today's Date: 06/18/2021 OT Individual Time: 3354-5625 OT Individual Time Calculation (min): 57 min    Short Term Goals: Week 2:  OT Short Term Goal 1 (Week 2): Pt will don pants with min A. OT Short Term Goal 2 (Week 2): Pt will complete 2/3 steps of toileting with CGA. OT Short Term Goal 3 (Week 2): Pt will complete ambulatory toilet transfer min A + LRAD.  Skilled Therapeutic Interventions/Progress Updates:    Pt received asleep in bed, easily awakened by voice, no c/o pain, agreeable to therapy. Session focus on self-care retraining, activity tolerance, func transfers/mobility, attention and task initiation, memory, static standing balance in prep for improved ADL/IADL/func mobility performance + decreased caregiver burden. Came to sitting EOB with close S and increased time. Amb to and from toilet CGA with RW, min Vcs to back up completely to toilet prior to sitting. CGA for LB clothing management overall, light min A to prevent pants from falling to ground and pt req min Vcs to initiate pericare. Amb back to w/c at sink. Completed UBD with distant S and min Vcs for initiation, LBD with CGA standing at sink. Oral care with close S for min Vcs for initiation. Pt donned B shoes with close S and increased time. Standing at [place], pt completed single target game, memory sequence game with first words then letters with the following results:  Accuracy: 93% Reaction Time: 3.98 Duration Time: 2 min Hits: 33 Accuracy: 83% Duration Time: 3 min Hits: 6 Accuracy: 75%  Duration Time: 48min  Hits: 6  Req close S for balance and seated rest break after 5 min. Mod Vcs for to correctly recall word/lett sequences of 5 + items.   Pt left in w/c with safety belt alarm engaged, call bell in reach, and all immediate needs met.    Therapy Documentation Precautions:   Precautions Precautions: Fall Precaution Comments: mild L hemiparesis Restrictions Weight Bearing Restrictions: No  Pain:  No c/o throughout session ADL: See Care Tool for more details.  Therapy/Group: Individual Therapy  Volanda Napoleon MS, OTR/L  06/18/2021, 6:49 AM

## 2021-06-18 NOTE — Progress Notes (Signed)
Physical Therapy Session Note  Patient Details  Name: Michaela Rios MRN: [id number] Date of Birth: 1951-09-08  Today's Date: 06/18/2021 PT Individual Time: [id number] PT Individual Time Calculation (min): 45 min   Short Term Goals: Week 2:  PT Short Term Goal 1 (Week 2): Pt will complete bed<>chair transfer with consistent CGA and no AD. PT Short Term Goal 2 (Week 2): Pt will ambulate at least 155f CGA to progress toward ambulation LTG PT Short Term Goal 3 (Week 2): Pt will complete at least 4 stairs MinA/ CGA. PT Short Term Goal 4 (Week 2): Pt will consistently complete sit to stands with supervision.  Skilled Therapeutic Interventions/Progress Updates: Pt presents supine in bed and agreeable to therapy.  Pt transfers sup to sit w/ supervision and increased time to scoot to EOB.  Pt able to don shoes w/ Figure-4 position after reaching down to retrieve.  Pt transfers sit to stand w/ supervision and then CGA for SPT bed > w/c.  Pt wheeled to Dayroom for time conservation.  Pt performed multiple sit to stand w/ Supervision but requires occasional verbal cues for forward scoot and then lean to improve performance.  Pt negotiated obstacle course of 6 cones weaving in and out w/ CGA to min A w/ turns.  Pt requires cueing for speed.  Pt performed standing stacking/unstacking cones w/ B Ues simultaneously standing on [employer]ire[employer] cushion .  Pt performed w/ gradually increasing reaches outside of BOS.  Pt amb multiple trials x 60' w/o AD and CGA.  Pt remained sitting in recliner w/ chair alarm on and all needs in reach.     Therapy Documentation Precautions:  Precautions Precautions: Fall Precaution Comments: mild L hemiparesis Restrictions Weight Bearing Restrictions: No General:   Vital Signs: Therapy Vitals Temp: 98 F (36.7 C) Temp Source: Oral Pulse Rate: 85 Resp: 18 BP: (!) 124/92 Patient Position (if appropriate): Sitting Oxygen Therapy SpO2: 98 % O2 Device: Room Air Pain:no c/o  pain.        Therapy/Group: Individual Therapy  JLadoris Gene7/22/2022, 3:52 PM

## 2021-06-18 NOTE — Progress Notes (Addendum)
PROGRESS NOTE   Subjective/Complaints:  No C/os overnite   ROS-  Pt denies SOB, abd pain, CP, N/V/C/D, and vision changes   Objective:   No results found. Recent Labs    06/16/21 0459  WBC 4.6  HGB 9.7*  HCT 30.6*  PLT 328    Recent Labs    06/16/21 0459  NA 141  K 3.9  CL 111  CO2 22  GLUCOSE 93  BUN 11  CREATININE 0.87  CALCIUM 9.3     Intake/Output Summary (Last 24 hours) at 06/18/2021 0820 Last data filed at 06/17/2021 1844 Gross per 24 hour  Intake 480 ml  Output --  Net 480 ml         Physical Exam: Vital Signs Blood pressure 137/90, pulse 92, temperature 98.4 F (36.9 C), resp. rate 20, height '5\' 3"'$  (1.6 m), weight 56.1 kg, SpO2 100 %. General: No acute distress Mood and affect are appropriate Heart: Regular rate and rhythm no rubs murmurs or extra sounds Lungs: Clear to auscultation, breathing unlabored, no rales or wheezes Abdomen: Positive bowel sounds, soft nontender to palpation, nondistended Extremities: No clubbing, cyanosis, or edema Skin: No evidence of breakdown, no evidence of rash  Extremities: No clubbing, cyanosis, or edema Skin: ecchymosis and swelling but no open areas dorsum of RIght hand Neurologic: Cranial nerves II through XII intact, motor strength is 5/5 in bilateral deltoid, bicep, tricep, grip, hip flexor, knee extensors, ankle dorsiflexor and plantar flexor  Musculoskeletal: Full range of motion in all 4 extremities. No joint swelling   Assessment/Plan: 1. Functional deficits which require 3+ hours per day of interdisciplinary therapy in a comprehensive inpatient rehab setting. Physiatrist is providing close team supervision and 24 hour management of active medical problems listed below. Physiatrist and rehab team continue to assess barriers to discharge/monitor patient progress toward functional and medical goals  Care Tool:  Bathing    Body parts bathed  by patient: Right arm, Left arm, Chest, Face, Front perineal area, Abdomen, Right upper leg, Left upper leg   Body parts bathed by helper: Buttocks     Bathing assist Assist Level: Minimal Assistance - Patient > 75%     Upper Body Dressing/Undressing Upper body dressing   What is the patient wearing?:  (jacket)    Upper body assist Assist Level: Supervision/Verbal cueing    Lower Body Dressing/Undressing Lower body dressing      What is the patient wearing?: Pants, Underwear/pull up     Lower body assist Assist for lower body dressing: Minimal Assistance - Patient > 75%     Toileting Toileting    Toileting assist Assist for toileting: Contact Guard/Touching assist     Transfers Chair/bed transfer  Transfers assist     Chair/bed transfer assist level: Contact Guard/Touching assist     Locomotion Ambulation   Ambulation assist      Assist level: Minimal Assistance - Patient > 75% Assistive device: No Device Max distance: 175f   Walk 10 feet activity   Assist     Assist level: Contact Guard/Touching assist Assistive device: No Device   Walk 50 feet activity   Assist Walk 50 feet with 2 turns  activity did not occur: Safety/medical concerns  Assist level: Minimal Assistance - Patient > 75% Assistive device: No Device    Walk 150 feet activity   Assist Walk 150 feet activity did not occur: Safety/medical concerns  Assist level: Minimal Assistance - Patient > 75% Assistive device: No Device    Walk 10 feet on uneven surface  activity   Assist Walk 10 feet on uneven surfaces activity did not occur: Safety/medical concerns         Wheelchair     Assist Will patient use wheelchair at discharge?: No   Wheelchair activity did not occur: N/A         Wheelchair 50 feet with 2 turns activity    Assist    Wheelchair 50 feet with 2 turns activity did not occur: N/A       Wheelchair 150 feet activity     Assist   Wheelchair 150 feet activity did not occur: N/A       Blood pressure 137/90, pulse 92, temperature 98.4 F (36.9 C), resp. rate 20, height '5\' 3"'$  (1.6 m), weight 56.1 kg, SpO2 100 %.   Medical Problem List and Plan: 1.  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  Antithrombotics: -DVT/anticoagulation:  Pharmaceutical: Lovenox             -antiplatelet therapy: DAPT X 3 weeks followed by ASA alone.  3. Pain Management: Tylenol prn. Has migraines- cannot take Imitrex- generally controlled but had one ~6p yesterday that lasted >1h- monitor on current meds   Controlled on topamax '100mg'$  BID, not using hydrocodone, will not need on D/C 4. Mood: LCSW to follow for evaluation and support.              -antipsychotic agents: N/A 5. Neuropsych: This patient is not  capable of making decisions on his own behalf. 6. Skin/Wound Care: Routine pressure relief measures.  7. Fluids/Electrolytes/Nutrition: Monitor I/O. Check lytes in am.  BMET nl 8. HTN: Monitor BP tid-- Vitals:   06/17/21 1926 06/18/21 0339  BP: 125/84 137/90  Pulse: 88 92  Resp: 20 20  Temp: 98.5 F (36.9 C) 98.4 F (36.9 C)  SpO2: 100% 100%   7/22 controlled  9. CKD III: Has been incontinent since admission. Will monitor voiding with PVR checkss.             --set toileting schedule.  7/17- voiding some on toilet- but still incontinent- con't voiding/toileting schedule 10. H/o depression/anxiety: Managed with Effexor, Wellbutrin and lorazepam prn. 11. H/o Migraines: controlled on Topamax.  Has had 4x since admission- may use Nurtec prn since this is not a vasoconstrictor; increase Topamax to '100mg'$  BID.    12. RML nodule: Recommend repeat CT in 12 months. 13. Hyponatremia: Continue to monitor for now 14. Acute on chronic anemia of chronic disease:  Hgb 11.6 at admission-->9.9 now up to 10.6 15.  FUO: WBC trending up but likely due to steroids.             --Continue to monitor for fevers or other signs of infection. 16. Poor appetite- will monitor- might need appetite stimulant 17. Constipation vs poor appetite- LBM 6 days ago- suggest KUB.    7/17- had 3 small to  medium BM's yesterday- con't regimen for now 18.  Somnolence , likely CVA vs med related denies depression ,normalUA C and S as well as CXR, CBC and BMET- pt with poor sleep has prn ambien but not using will schedule  LOS: 10 days A FACE TO FACE EVALUATION WAS PERFORMED  Charlett Blake 06/18/2021, 8:20 AM

## 2021-06-18 NOTE — Progress Notes (Signed)
Occupational Therapy Session Note  Patient Details  Name: Michaela Rios MRN: [id number] Date of Birth: [birth year]  Today's Date: 06/18/2021 OT Group Time: 1100-1200 OT Group Time Calculation (min): 60 min   Short Term Goals: Week 1:  OT Short Term Goal 1 (Week 1): Pt will don pants mod A. OT Short Term Goal 1 - Progress (Week 1): Met OT Short Term Goal 2 (Week 1): Pt will complete STS with min A + LRAD in prep for standing ADL. OT Short Term Goal 2 - Progress (Week 1): Met OT Short Term Goal 3 (Week 1): Pt will complete toilet transfer min A. OT Short Term Goal 3 - Progress (Week 1): Met  Skilled Therapeutic Interventions/Progress Updates:    Pt participated in rhythmic drumming group. Pain not reported during session. Focus of group on BUE coordination, strengthening, endurance, timing/control, activity tolerance, and social participation and engagement. Pt performs session from seated position for energy conservation. Skilled interventions included grading movements to medium range of motion to improve timing and control. Pt frustrated with performance, however liked group. Warm up performed prior to exercises and UB stretching completed at end of group with demo from OT. Pt able to select preferred song to share with group. Returned pt to room at end of session. Exited session with pt seated in bed, exit alarm on and call light in reach   Therapy Documentation Precautions:  Precautions Precautions: Fall Precaution Comments: mild L hemiparesis Restrictions Weight Bearing Restrictions: No General:   Vital Signs: Therapy Vitals Temp: 98.4 F (36.9 C) Pulse Rate: 92 Resp: 20 BP: 137/90 Patient Position (if appropriate): Lying Oxygen Therapy SpO2: 100 % O2 Device: Room Air Pain:   ADL: ADL Eating: Set up, Supervision/safety Where Assessed-Eating: Wheelchair Grooming: Supervision/safety Where Assessed-Grooming: Sitting at sink Upper Body Bathing:  Supervision/safety Where Assessed-Upper Body Bathing: Sitting at sink Lower Body Bathing: Minimal assistance Where Assessed-Lower Body Bathing: Sitting at sink Upper Body Dressing: Supervision/safety Where Assessed-Upper Body Dressing: Sitting at sink Lower Body Dressing: Maximal assistance Where Assessed-Lower Body Dressing: Standing at sink Toileting: Maximal assistance Where Assessed-Toileting: Glass blower/designer: Moderate assistance Toilet Transfer Method: Stand pivot Toilet Transfer Equipment: Grab bars, Raised toilet seat, Bedside commode Tub/Shower Transfer: Not assessed Social research officer, government: Not assessed Vision   Perception    Praxis   Exercises:   Other Treatments:     Therapy/Group: Group Therapy  Tonny Branch 06/18/2021, 6:42 AM

## 2021-06-18 NOTE — Telephone Encounter (Signed)
Jonni Sanger I just wanted to clarify that you did see this patient today? If so I will cancel her wound check appointment.

## 2021-06-18 NOTE — Progress Notes (Signed)
Speech Language Pathology Daily Session Note  Patient Details  Name: Michaela Rios MRN: [id number] Date of Birth: January 30, 1951  Today's Date: 06/18/2021 SLP Individual Time: 1425-1510 SLP Individual Time Calculation (min): 45 min  Short Term Goals: Week 2: SLP Short Term Goal 1 (Week 2): Patient will demonstrate selective attention to functional tasks in a mildly-to-moderately distracting environment for 20 minutes with min A cues for re-direction SLP Short Term Goal 2 (Week 2): Patient will attend to left field of environment during functional tasks with sup A cues SLP Short Term Goal 3 (Week 2): Patient will demonstrate functional problem solving for complex tasks with min-sup A cues SLP Short Term Goal 4 (Week 2): Patient will demonstrate recent recall (20 minutes or greater) of novel information with min A verbal cues with use of compensatory strategies SLP Short Term Goal 5 (Week 2): Patient will demonstrate improved awareness by identifying and repairing errors with sup A cues.  Skilled Therapeutic Interventions: Patient agreeable to skilled ST intervention with focus on cognitive goals. SLP facilitated moderately complex daily schedule planning task with moderate A verbal and visual cues for organization, recall, attention, and re-direction to task. Patient exhibited decreased attention to task and would often take extended breaks with encouragement to persist to task. She exhibited awareness of errors with sup A verbal cues and able to repair with sup A. Patient was left in wheelchair with alarm activated and immediate needs within reach at end of session. Continue per current plan of care.      Pain Pain Assessment Pain Scale: 0-10 Pain Score: 0-No pain  Therapy/Group: Individual Therapy  Patty Sermons 06/18/2021, 4:14 PM

## 2021-06-18 NOTE — Progress Notes (Signed)
ILR site stable. Steri-strips previously fallen off.  Monitor at patients residence. She will instruct husband to bring it in over the weekend.  If still not sending by Monday will reach back out.  Legrand Como [address] [place], [place]

## 2021-06-18 NOTE — Plan of Care (Signed)
  Problem: Consults Goal: RH STROKE PATIENT EDUCATION Description: See Patient Education module for education specifics  Outcome: Progressing   Problem: RH BOWEL ELIMINATION Goal: RH STG MANAGE BOWEL WITH ASSISTANCE Description: STG Manage Bowel with min Assistance. Outcome: Progressing Goal: RH STG MANAGE BOWEL W/MEDICATION W/ASSISTANCE Description: STG Manage Bowel with Medication with Assistance. Outcome: Progressing   Problem: RH BLADDER ELIMINATION Goal: RH STG MANAGE BLADDER WITH ASSISTANCE Description: STG Manage Bladder With min  Assistance Outcome: Progressing   Problem: RH SAFETY Goal: RH STG ADHERE TO SAFETY PRECAUTIONS W/ASSISTANCE/DEVICE Description: STG Adhere to Safety Precautions With Assistance/Device. Outcome: Progressing   Problem: RH COGNITION-NURSING Goal: RH STG USES MEMORY AIDS/STRATEGIES W/ASSIST TO PROBLEM SOLVE Description: STG Uses Memory Aids/Strategies With cues/reminders Assistance to Problem Solve. Outcome: Progressing   Problem: RH PAIN MANAGEMENT Goal: RH STG PAIN MANAGED AT OR BELOW PT'S PAIN GOAL Description: At or below level 4 Outcome: Progressing   Problem: RH KNOWLEDGE DEFICIT Goal: RH STG INCREASE KNOWLEDGE OF DIABETES Description: Patient will be able to manage prediabetes with dietary modifications using handouts and educational tools with cues/reminders Outcome: Progressing Goal: RH STG INCREASE KNOWLEDGE OF HYPERTENSION Description: Patient will be able to manage HTN with medications and dietary modifications using handouts and educational tools with cues/reminders Outcome: Progressing Goal: RH STG INCREASE KNOWLEDGE OF STROKE PROPHYLAXIS Description: Patient will be able to manage secondary stroke risks with meds and dietary modifications using handouts and educational tools with cues/reminders Outcome: Progressing

## 2021-06-19 NOTE — Plan of Care (Signed)
  Problem: Consults Goal: RH STROKE PATIENT EDUCATION Description: See Patient Education module for education specifics  06/19/2021 0904 by Sanda Linger, LPN Outcome: Progressing 06/18/2021 1934 by Renda Rolls L, LPN Outcome: Progressing   Problem: RH BOWEL ELIMINATION Goal: RH STG MANAGE BOWEL WITH ASSISTANCE Description: STG Manage Bowel with min Assistance. 06/19/2021 0904 by Sanda Linger, LPN Outcome: Progressing 06/18/2021 1934 by Sanda Linger, LPN Outcome: Progressing Goal: RH STG MANAGE BOWEL W/MEDICATION W/ASSISTANCE Description: STG Manage Bowel with Medication with Assistance. 06/19/2021 0904 by Sanda Linger, LPN Outcome: Progressing 06/18/2021 1934 by Sanda Linger, LPN Outcome: Progressing   Problem: RH BLADDER ELIMINATION Goal: RH STG MANAGE BLADDER WITH ASSISTANCE Description: STG Manage Bladder With min  Assistance 06/19/2021 0904 by Sanda Linger, LPN Outcome: Progressing 06/18/2021 1934 by Sanda Linger, LPN Outcome: Progressing   Problem: RH SAFETY Goal: RH STG ADHERE TO SAFETY PRECAUTIONS W/ASSISTANCE/DEVICE Description: STG Adhere to Safety Precautions With Assistance/Device. 06/19/2021 0904 by Sanda Linger, LPN Outcome: Progressing 06/18/2021 1934 by Sanda Linger, LPN Outcome: Progressing   Problem: RH COGNITION-NURSING Goal: RH STG USES MEMORY AIDS/STRATEGIES W/ASSIST TO PROBLEM SOLVE Description: STG Uses Memory Aids/Strategies With cues/reminders Assistance to Problem Solve. 06/19/2021 0904 by Sanda Linger, LPN Outcome: Progressing 06/18/2021 1934 by Sanda Linger, LPN Outcome: Progressing   Problem: RH PAIN MANAGEMENT Goal: RH STG PAIN MANAGED AT OR BELOW PT'S PAIN GOAL Description: At or below level 4 06/19/2021 0904 by Renda Rolls L, LPN Outcome: Progressing 06/18/2021 1934 by Renda Rolls L, LPN Outcome: Progressing   Problem: RH KNOWLEDGE DEFICIT Goal: RH STG INCREASE KNOWLEDGE OF DIABETES Description: Patient will be able to  manage prediabetes with dietary modifications using handouts and educational tools with cues/reminders 06/19/2021 0904 by Renda Rolls L, LPN Outcome: Progressing 06/18/2021 1934 by Sanda Linger, LPN Outcome: Progressing Goal: RH STG INCREASE KNOWLEDGE OF HYPERTENSION Description: Patient will be able to manage HTN with medications and dietary modifications using handouts and educational tools with cues/reminders 06/19/2021 0904 by Renda Rolls L, LPN Outcome: Progressing 06/18/2021 1934 by Sanda Linger, LPN Outcome: Progressing Goal: RH STG INCREASE KNOWLEDGE OF STROKE PROPHYLAXIS Description: Patient will be able to manage secondary stroke risks with meds and dietary modifications using handouts and educational tools with cues/reminders 06/19/2021 0904 by Sanda Linger, LPN Outcome: Progressing 06/18/2021 1934 by Sanda Linger, LPN Outcome: Progressing

## 2021-06-19 NOTE — Progress Notes (Signed)
Physical Therapy Session Note  Patient Details  Name: Michaela Rios MRN: [id number] Date of Birth: 1951/08/21  Today's Date: 06/19/2021 PT Individual Time: 1305-1400 PT Individual Time Calculation (min): 55 min   Short Term Goals:  Week 2:  PT Short Term Goal 1 (Week 2): Pt will complete bed<>chair transfer with consistent CGA and no AD. PT Short Term Goal 2 (Week 2): Pt will ambulate at least 121f CGA to progress toward ambulation LTG PT Short Term Goal 3 (Week 2): Pt will complete at least 4 stairs MinA/ CGA. PT Short Term Goal 4 (Week 2): Pt will consistently complete sit to stands with supervision.   Skilled Therapeutic Interventions/Progress Updates:   Pt received sitting in WC and agreeable to PT. [facility] and [facility] time.  BP  Sitting: 122/88 HR 96 Standing 96/81. Standing 1 minute 112/82. Pt asymptomatic.   Gait training with RW x 763fwith supervision assist. Additional gait training without AD x [id number] supervision assist. Noted to have increased step length without AD, but decreased balance and safety with turning to sit, requiring min assist to prevent posterior LOB.   Dynamic gait training instructed by PT. Stepping over 1 inch bar weight 3 x 2 with min assist. Stepping over 4 inch bolster x 3 with min assist. Weaving through 6 cones x 2 with supervision assist. Forward/reverse gait 2x [id number] LOB when stepping over obstacles due to posterior LOB as well as poor step length/height and landing on bolster. Pt able to improve anterior weight shift and step length with instruction.   Sit<>stand with supervision assist throughout session with supervision assist and increased time for adequate anterior weight shift.   Pt returned to room and performed stand pivot transfer to bed with supervision assist. Sit>supine completed with supervision assist for safety, and left supine in bed  with call bell in reach and all needs met.         Therapy Documentation Precautions:  Precautions Precautions: Fall Precaution Comments: mild L hemiparesis Restrictions Weight Bearing Restrictions: No  Vital Signs: Therapy Vitals Temp: 99.2 F (37.3 C) Pulse Rate: 91 Resp: 18 BP: 123/89 Patient Position (if appropriate): Lying Oxygen Therapy SpO2: 100 % O2 Device: Room Air Pain: denies    Therapy/Group: Individual Therapy  AustLorie Phenix3/2022, 3:30 PM

## 2021-06-20 NOTE — Plan of Care (Signed)
  Problem: Consults Goal: RH STROKE PATIENT EDUCATION Description: See Patient Education module for education specifics  Outcome: Progressing   Problem: RH BOWEL ELIMINATION Goal: RH STG MANAGE BOWEL WITH ASSISTANCE Description: STG Manage Bowel with min Assistance. Outcome: Progressing Goal: RH STG MANAGE BOWEL W/MEDICATION W/ASSISTANCE Description: STG Manage Bowel with Medication with Assistance. Outcome: Progressing   Problem: RH BLADDER ELIMINATION Goal: RH STG MANAGE BLADDER WITH ASSISTANCE Description: STG Manage Bladder With min  Assistance Outcome: Progressing   Problem: RH SAFETY Goal: RH STG ADHERE TO SAFETY PRECAUTIONS W/ASSISTANCE/DEVICE Description: STG Adhere to Safety Precautions With Assistance/Device. Outcome: Progressing   Problem: RH COGNITION-NURSING Goal: RH STG USES MEMORY AIDS/STRATEGIES W/ASSIST TO PROBLEM SOLVE Description: STG Uses Memory Aids/Strategies With cues/reminders Assistance to Problem Solve. Outcome: Progressing   Problem: RH PAIN MANAGEMENT Goal: RH STG PAIN MANAGED AT OR BELOW PT'S PAIN GOAL Description: At or below level 4 Outcome: Progressing   Problem: RH KNOWLEDGE DEFICIT Goal: RH STG INCREASE KNOWLEDGE OF DIABETES Description: Patient will be able to manage prediabetes with dietary modifications using handouts and educational tools with cues/reminders Outcome: Progressing Goal: RH STG INCREASE KNOWLEDGE OF HYPERTENSION Description: Patient will be able to manage HTN with medications and dietary modifications using handouts and educational tools with cues/reminders Outcome: Progressing Goal: RH STG INCREASE KNOWLEDGE OF STROKE PROPHYLAXIS Description: Patient will be able to manage secondary stroke risks with meds and dietary modifications using handouts and educational tools with cues/reminders Outcome: Progressing

## 2021-06-20 NOTE — Progress Notes (Signed)
PROGRESS NOTE   Subjective/Complaints:  Pt without new issues , slept well, like raisin bran for breakfast   ROS-  Pt denies SOB, abd pain, CP, N/V/C/D, and vision changes   Objective:   No results found. No results for input(s): WBC, HGB, HCT, PLT in the last 72 hours.  No results for input(s): NA, K, CL, CO2, GLUCOSE, BUN, CREATININE, CALCIUM in the last 72 hours.   Intake/Output Summary (Last 24 hours) at 06/20/2021 0806 Last data filed at 06/19/2021 1315 Gross per 24 hour  Intake 240 ml  Output --  Net 240 ml         Physical Exam: Vital Signs Blood pressure (!) 134/93, pulse 90, temperature 98.7 F (37.1 C), temperature source Oral, resp. rate 15, height '5\' 3"'$  (1.6 m), weight 56.1 kg, SpO2 100 %.  General: No acute distress Mood and affect are appropriate Heart: Regular rate and rhythm no rubs murmurs or extra sounds Lungs: Clear to auscultation, breathing unlabored, no rales or wheezes Abdomen: Positive bowel sounds, soft nontender to palpation, nondistended Extremities: No clubbing, cyanosis, or edema Skin: No evidence of breakdown, no evidence of rash  Skin: ecchymosis and swelling but no open areas dorsum of RIght hand Neurologic: Cranial nerves II through XII intact, motor strength is 5/5 in bilateral deltoid, bicep, tricep, grip, hip flexor, knee extensors, ankle dorsiflexor and plantar flexor  Musculoskeletal: Full range of motion in all 4 extremities. No joint swelling   Assessment/Plan: 1. Functional deficits which require 3+ hours per day of interdisciplinary therapy in a comprehensive inpatient rehab setting. Physiatrist is providing close team supervision and 24 hour management of active medical problems listed below. Physiatrist and rehab team continue to assess barriers to discharge/monitor patient progress toward functional and medical goals  Care Tool:  Bathing    Body parts bathed by  patient: Right arm, Left arm, Chest, Face, Front perineal area, Abdomen, Right upper leg, Left upper leg   Body parts bathed by helper: Buttocks     Bathing assist Assist Level: Minimal Assistance - Patient > 75%     Upper Body Dressing/Undressing Upper body dressing   What is the patient wearing?: Pull over shirt    Upper body assist Assist Level: Supervision/Verbal cueing    Lower Body Dressing/Undressing Lower body dressing      What is the patient wearing?: Pants     Lower body assist Assist for lower body dressing: Contact Guard/Touching assist     Toileting Toileting    Toileting assist Assist for toileting: Contact Guard/Touching assist     Transfers Chair/bed transfer  Transfers assist     Chair/bed transfer assist level: Contact Guard/Touching assist     Locomotion Ambulation   Ambulation assist      Assist level: Minimal Assistance - Patient > 75% Assistive device: No Device Max distance: 60   Walk 10 feet activity   Assist     Assist level: Contact Guard/Touching assist Assistive device: No Device   Walk 50 feet activity   Assist Walk 50 feet with 2 turns activity did not occur: Safety/medical concerns  Assist level: Minimal Assistance - Patient > 75% Assistive device: No Device  Walk 150 feet activity   Assist Walk 150 feet activity did not occur: Safety/medical concerns  Assist level: Minimal Assistance - Patient > 75% Assistive device: No Device    Walk 10 feet on uneven surface  activity   Assist Walk 10 feet on uneven surfaces activity did not occur: Safety/medical concerns         Wheelchair     Assist Will patient use wheelchair at discharge?: No   Wheelchair activity did not occur: N/A         Wheelchair 50 feet with 2 turns activity    Assist    Wheelchair 50 feet with 2 turns activity did not occur: N/A       Wheelchair 150 feet activity     Assist  Wheelchair 150 feet activity  did not occur: N/A       Blood pressure (!) 134/93, pulse 90, temperature 98.7 F (37.1 C), temperature source Oral, resp. rate 15, height '5\' 3"'$  (1.6 m), weight 56.1 kg, SpO2 100 %.   Medical Problem List and Plan: 1.  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  Antithrombotics: -DVT/anticoagulation:  Pharmaceutical: Lovenox             -antiplatelet therapy: DAPT X 3 weeks followed by ASA alone.  3. Pain Management: Tylenol prn. Has migraines- cannot take Imitrex- generally controlled but had one ~6p yesterday that lasted >1h- monitor on current meds   Controlled on topamax '100mg'$  BID, not using hydrocodone, will not need on D/C 4. Mood: LCSW to follow for evaluation and support.              -antipsychotic agents: N/A 5. Neuropsych: This patient is not  capable of making decisions on his own behalf. 6. Skin/Wound Care: Routine pressure relief measures.  7. Fluids/Electrolytes/Nutrition: Monitor I/O. Check lytes in am.  BMET nl 8. HTN: Monitor BP tid-- Vitals:   06/19/21 2014 06/20/21 0442  BP: 137/89 (!) 134/93  Pulse: 93 90  Resp: 18 15  Temp: 98.5 F (36.9 C) 98.7 F (37.1 C)  SpO2: 96% 100%   7/24 controlled  9. CKD III: Has been incontinent since admission. Will monitor voiding with PVR checkss.             --set toileting schedule.  7/17- voiding some on toilet- but still incontinent- con't voiding/toileting schedule 10. H/o depression/anxiety: Managed with Effexor, Wellbutrin and lorazepam prn. 11. H/o Migraines: controlled on Topamax.  Has had 4x since admission- may use Nurtec prn since this is not a vasoconstrictor; increase Topamax to '100mg'$  BID.    12. RML nodule: Recommend repeat CT in 12 months. 13. Hyponatremia: Continue to monitor for now 14. Acute on chronic anemia of chronic disease:  Hgb 11.6 at admission-->9.9 now up to 10.6 15.  FUO: WBC trending up but likely due to steroids.             --Continue to monitor for fevers or other signs of infection. 16. Poor appetite- will monitor- might need appetite stimulant 17. Constipation vs poor appetite- LBM 6 days ago- suggest KUB.    7/17- had 3 small to medium BM's yesterday- con't regimen for now 18.  Somnolence , likely CVA vs med related denies  depression ,normalUA C and S as well as CXR, CBC and BMET- pt with poor sleep has prn ambien but not using will schedule  LOS: 12 days A FACE TO FACE EVALUATION WAS PERFORMED  Charlett Blake 06/20/2021, 8:06 AM

## 2021-06-21 DIAGNOSIS — G43009 Migraine without aura, not intractable, without status migrainosus: Secondary | ICD-10-CM

## 2021-06-21 DIAGNOSIS — D649 Anemia, unspecified: Secondary | ICD-10-CM

## 2021-06-21 NOTE — Progress Notes (Signed)
PROGRESS NOTE   Subjective/Complaints:  Pt in bed. Just finished breakfast. No complaints. Denies pain.   ROS: Patient denies fever, rash, sore throat, blurred vision, nausea, vomiting, diarrhea, cough, shortness of breath or chest pain, joint or back pain, headache, or mood change.    Objective:   No results found. No results for input(s): WBC, HGB, HCT, PLT in the last 72 hours.  No results for input(s): NA, K, CL, CO2, GLUCOSE, BUN, CREATININE, CALCIUM in the last 72 hours.   Intake/Output Summary (Last 24 hours) at 06/21/2021 1349 Last data filed at 06/21/2021 0750 Gross per 24 hour  Intake 440 ml  Output --  Net 440 ml        Physical Exam: Vital Signs Blood pressure 122/81, pulse 91, temperature 98.2 F (36.8 C), temperature source Oral, resp. rate 20, height '5\' 3"'$  (1.6 m), weight 56.1 kg, SpO2 98 %.  Constitutional: No distress . Vital signs reviewed. HEENT: EOMI, oral membranes moist Neck: supple Cardiovascular: RRR without murmur. No JVD    Respiratory/Chest: CTA Bilaterally without wheezes or rales. Normal effort    GI/Abdomen: BS +, non-tender, non-distended Ext: no clubbing, cyanosis, or edema Psych: quiet but cooperative  Skin: ecchymosis and swelling but no open areas dorsum of RIght hand Neurologic: Cranial nerves II through XII intact, motor strength is 4-5/5 in bilateral deltoid, bicep, tricep, grip, hip flexor, knee extensors, ankle dorsiflexor and plantar flexor. No sensory changes  Musculoskeletal: Full ROM, No pain with AROM or PROM in the neck, trunk, or extremities. Posture appropriate    Assessment/Plan: 1. Functional deficits which require 3+ hours per day of interdisciplinary therapy in a comprehensive inpatient rehab setting. Physiatrist is providing close team supervision and 24 hour management of active medical problems listed below. Physiatrist and rehab team continue to assess  barriers to discharge/monitor patient progress toward functional and medical goals  Care Tool:  Bathing    Body parts bathed by patient: Right arm, Left arm, Chest, Face, Front perineal area, Abdomen, Right upper leg, Left upper leg, Buttocks   Body parts bathed by helper: Buttocks     Bathing assist Assist Level: Contact Guard/Touching assist     Upper Body Dressing/Undressing Upper body dressing   What is the patient wearing?: Pull over shirt    Upper body assist Assist Level: Supervision/Verbal cueing    Lower Body Dressing/Undressing Lower body dressing      What is the patient wearing?: Pants, Underwear/pull up     Lower body assist Assist for lower body dressing: Contact Guard/Touching assist     Toileting Toileting    Toileting assist Assist for toileting: Contact Guard/Touching assist     Transfers Chair/bed transfer  Transfers assist     Chair/bed transfer assist level: Contact Guard/Touching assist     Locomotion Ambulation   Ambulation assist      Assist level: Minimal Assistance - Patient > 75% Assistive device: No Device Max distance: 60   Walk 10 feet activity   Assist     Assist level: Contact Guard/Touching assist Assistive device: No Device   Walk 50 feet activity   Assist Walk 50 feet with 2 turns activity did not  occur: Safety/medical concerns  Assist level: Minimal Assistance - Patient > 75% Assistive device: No Device    Walk 150 feet activity   Assist Walk 150 feet activity did not occur: Safety/medical concerns  Assist level: Minimal Assistance - Patient > 75% Assistive device: No Device    Walk 10 feet on uneven surface  activity   Assist Walk 10 feet on uneven surfaces activity did not occur: Safety/medical concerns         Wheelchair     Assist Will patient use wheelchair at discharge?: No   Wheelchair activity did not occur: N/A         Wheelchair 50 feet with 2 turns  activity    Assist    Wheelchair 50 feet with 2 turns activity did not occur: N/A       Wheelchair 150 feet activity     Assist  Wheelchair 150 feet activity did not occur: N/A       Blood pressure 122/81, pulse 91, temperature 98.2 F (36.8 C), temperature source Oral, resp. rate 20, height '5\' 3"'$  (1.6 m), weight 56.1 kg, SpO2 98 %.   Medical Problem List and Plan: 1.  B/L weakness secondary to R BG and B/L thalamic infarcts             -patient may  shower             -ELOS/Goals: 07/01/21 supervision to min A,   -Continue CIR therapies including PT, OT, and SLP  2.  Antithrombotics: -DVT/anticoagulation:  Pharmaceutical: Lovenox             -antiplatelet therapy: DAPT X 3 weeks followed by ASA alone.  3. Pain Management: Tylenol prn. Has migraines- cannot take Imitrex- generally controlled but had one ~6p yesterday that lasted >1h- monitor on current meds   Doing well with topamax '100mg'$  BID, not using hydrocodone, will not need on D/C 4. Mood: LCSW to follow for evaluation and support.              -antipsychotic agents: N/A 5. Neuropsych: This patient is not  capable of making decisions on his own behalf. 6. Skin/Wound Care: Routine pressure relief measures.  7. Fluids/Electrolytes/Nutrition: encourage PO 8. HTN: Monitor BP tid-- Vitals:   06/21/21 0405 06/21/21 1331  BP: 134/86 122/81  Pulse: 86 91  Resp: 16 20  Temp: 98.3 F (36.8 C) 98.2 F (36.8 C)  SpO2: 98% 98%   7/25 controlled  9. CKD III: Has been incontinent since admission. Will monitor voiding with PVR checkss.             --set toileting schedule.  7/17- voiding some on toilet- incontinence gradually improving- con't voiding/toileting schedule 10. H/o depression/anxiety: Managed with Effexor, Wellbutrin and lorazepam prn. 11. H/o Migraines: controlled on Topamax.  Has had 4x since admission- may use Nurtec prn since this is not a vasoconstrictor; increased Topamax to '100mg'$  BID.    12. RML  nodule: Recommend repeat CT in 12 months. 13. Hyponatremia: Continue to monitor for now 14. Acute on chronic anemia of chronic disease:  Hgb 11.6 at admission-->9.9 >10.6>9.7 15. FUO: WBC wnl             --Continue to monitor for fevers or other signs of infection. 16. Poor appetite- eating 50-100% currently 17. Constipation vs poor appetite- LBM 6 days ago- suggest KUB.    Moved bowels yesterday  18.  Somnolence , likely CVA vs med related denies depression ,normalUA C and S  as well as CXR, CBC and BMET- pt with poor sleep has prn ambien but not using will schedule    -seems improved   LOS: 13 days A FACE TO FACE EVALUATION WAS PERFORMED  Meredith Staggers 06/21/2021, 1:49 PM

## 2021-06-21 NOTE — Progress Notes (Signed)
Speech Language Pathology Daily Session Note  Patient Details  Name: Michaela Rios MRN: [id number] Date of Birth: Oct 05, 1951  Today's Date: 06/21/2021 SLP Individual Time: 1100-1130 SLP Individual Time Calculation (min): 30 min  Short Term Goals: Week 2: SLP Short Term Goal 1 (Week 2): Patient will demonstrate selective attention to functional tasks in a mildly-to-moderately distracting environment for 20 minutes with min A cues for re-direction SLP Short Term Goal 2 (Week 2): Patient will attend to left field of environment during functional tasks with sup A cues SLP Short Term Goal 3 (Week 2): Patient will demonstrate functional problem solving for complex tasks with min-sup A cues SLP Short Term Goal 4 (Week 2): Patient will demonstrate recent recall (20 minutes or greater) of novel information with min A verbal cues with use of compensatory strategies SLP Short Term Goal 5 (Week 2): Patient will demonstrate improved awareness by identifying and repairing errors with sup A cues.  Skilled Therapeutic Interventions:Skilled ST services focused on cognitive skills. SLP facilitated complex problem solving, error awareness and recall skills in novel simple deductive reasoning task, pt initially required step by step instructions fading to mod A verbal cues. Pt was left in room with call bell within reach and bed alarm set. SLP recommends to continue skilled services.     Pain Pain Assessment Pain Score: 0-No pain  Therapy/Group: Individual Therapy  Talar Fraley  [facility] 06/21/2021, 12:37 PM

## 2021-06-21 NOTE — Progress Notes (Signed)
Physical Therapy Session Note  Patient Details  Name: Michaela Rios MRN: [id number] Date of Birth: [birth year]  Today's Date: 06/21/2021 PT Individual Time: 0802-0900 PT Individual Time Calculation (min): 58 min   Short Term Goals: Week 2:  PT Short Term Goal 1 (Week 2): Pt will complete bed<>chair transfer with consistent CGA and no AD. PT Short Term Goal 2 (Week 2): Pt will ambulate at least 128f CGA to progress toward ambulation LTG PT Short Term Goal 3 (Week 2): Pt will complete at least 4 stairs MinA/ CGA. PT Short Term Goal 4 (Week 2): Pt will consistently complete sit to stands with supervision.  Skilled Therapeutic Interventions/Progress Updates:  Patient supine in bed on entrance to room. Patient initially asleep and requires time to fully rouse. Then alert and agreeable to PT session. Patient with no pain complaint throughout session.  Therapeutic Activity: Bed Mobility: Patient performed supine --> sit with supervision and extra time to complete to full upright seated position. VC required for effort and scooting forward to EOB for improved balance in sitting position. Transfers: Patient performed STS and SPVT transfers throughout session with supervision. Continued focus on performing functional transfers with no AD and with slow, purposeful movements throughout. Provided vc for completing turn to seat prior to sit and reaching back for armrests prior to descent to sit. Ambulatory toilet transfer performed with light CGA and pt able to perform clothing mgmt with supervision. When re-donning pants, pt able to reach all the way to floor and pull up with no LOB.   Gait Training:  Patient ambulated 150 ' x1 throughout therapy gym and in hallway with task of collecting cones placed from lower shin height to overhead height and requiring safe planning of body placement to maintain balance.  Completes with no AD and demonstrating safe reaching and awareness of balance.   Gait  training performed with no AD and reaching 200' x1/ 180' x1. Demonstrated improved step length/ height with vc. Also noted improvements in maintaining midline, equal stance time bilaterally.  Neuromuscular Re-ed: NMR facilitated during session with focus on dynamic stepping over cones. Pt nervous initially d/t height of cones. Pt guided verbally through necessary steps to attain and maintain balance throughout first performance with one catch of trailing LE on one cone. Pt able to perform x3 more bouts with less vc re: how to maintain balance and more for encouragement. Pt with one instance of posterior LOB requiring Min A to correct. . NMR performed for improvements in motor control and coordination, balance, sequencing, judgement, and self confidence/ efficacy in performing all aspects of mobility at highest level of independence.   Patient seated upright in w/c at end of session with brakes locked, belt alarm set, and all needs within reach.     Therapy Documentation Precautions:  Precautions Precautions: Fall Precaution Comments: mild L hemiparesis Restrictions Weight Bearing Restrictions: No  Pain: Pain Assessment Pain Score: 0-No pain  Therapy/Group: Individual Therapy  JAlger SimonsPT, DPT 06/21/2021, 4:23 PM

## 2021-06-21 NOTE — Progress Notes (Signed)
Occupational Therapy Session Note  Patient Details  Name: Michaela Rios MRN: [id number] Date of Birth: Mar 20, 1951  Today's Date: 06/21/2021 OT Individual Time: 1000-1100 OT Individual Time Calculation (min): 60 min    Short Term Goals: Week 2:  OT Short Term Goal 1 (Week 2): Pt will don pants with min A. OT Short Term Goal 2 (Week 2): Pt will complete 2/3 steps of toileting with CGA. OT Short Term Goal 3 (Week 2): Pt will complete ambulatory toilet transfer min A + LRAD.  Skilled Therapeutic Interventions/Progress Updates:    Patient seated in w/c, alert and ready for therapy session. She denies pain but states that she feels fatigued.  She requests to wash and change clothes but does not feel up to taking a shower this am.  Sit to stand and short distance ambulation in room without AD CGA to/from w/c, toilet and edge of bed.  She is able to gather clothing and items needed for adl with CS/CGA.  Completed grooming tasks w/c level with set up.  UB bathing/dressing with CS.  Toileting with CS/CGA.  LB bathing dressing with CS/CGA. She is able to recall daily events and past activities completed.  Reviewed bladder management techniques.  She returned to bed at close of session for brief rest break prior to upcoming session, bed alarm set and call bell in hand.    Therapy Documentation Precautions:  Precautions Precautions: Fall Precaution Comments: mild L hemiparesis Restrictions Weight Bearing Restrictions: No   Therapy/Group: Individual Therapy  Carlos Levering 06/21/2021, 7:30 AM

## 2021-06-21 NOTE — Progress Notes (Signed)
Physical Therapy Session Note  Patient Details  Name: DAYANIS BERGQUIST MRN: [id number] Date of Birth: 1951/11/27  Today's Date: 06/21/2021 PT Individual Time: 1130-1202 PT Individual Time Calculation (min): 32 min   Short Term Goals: Week 1:  PT Short Term Goal 1 (Week 1): Pt will complete bed mobility MinA PT Short Term Goal 1 - Progress (Week 1): Met PT Short Term Goal 2 (Week 1): Pt will complete bed<>chair transfer MinA PT Short Term Goal 2 - Progress (Week 1): Met PT Short Term Goal 3 (Week 1): Pt will ambulate at least 29f ModA to progression towards ambulation LTG PT Short Term Goal 3 - Progress (Week 1): Met PT Short Term Goal 4 (Week 1): Pt will complete at least 4 stairs MinA PT Short Term Goal 4 - Progress (Week 1): Progressing toward goal PT Short Term Goal 5 (Week 1): Pt will consistently complete sit to stands with MinA PT Short Term Goal 5 - Progress (Week 1): Met Week 2:  PT Short Term Goal 1 (Week 2): Pt will complete bed<>chair transfer with consistent CGA and no AD. PT Short Term Goal 2 (Week 2): Pt will ambulate at least 104fCGA to progress toward ambulation LTG PT Short Term Goal 3 (Week 2): Pt will complete at least 4 stairs MinA/ CGA. PT Short Term Goal 4 (Week 2): Pt will consistently complete sit to stands with supervision.  Skilled Therapeutic Interventions/Progress Updates:    pt received in bed at end of SLP session and agreeable to therapy. No complaint of pain. Bed mobility with supervision, pt donned shoes while sitting EOB with set up, required min to don L shoe. Gait x 150 ft with RW and CGA, pt demoed variable step length and occasional shuffling steps when turning. Further gait training without AD 2 x 150 ft with cues for improved cadence and step length. NMR w/ agility ladder, x 4 with cues to take big steps, CGA fading to mod A with fatigue for posterior LOB. Gait x 150 ft back to pt room, pt doffed shoes independently, bed mobility with supervision.  Pt left in room with bed alarm set. All needs in reach.  Therapy Documentation Precautions:  Precautions Precautions: Fall Precaution Comments: mild L hemiparesis Restrictions Weight Bearing Restrictions: No    Therapy/Group: Individual Therapy  OlMickel Fuchs/25/2022, 12:38 PM

## 2021-06-21 NOTE — Plan of Care (Signed)
  Problem: Consults Goal: RH STROKE PATIENT EDUCATION Description: See Patient Education module for education specifics  Outcome: Progressing   Problem: RH BOWEL ELIMINATION Goal: RH STG MANAGE BOWEL WITH ASSISTANCE Description: STG Manage Bowel with min Assistance. Outcome: Progressing Goal: RH STG MANAGE BOWEL W/MEDICATION W/ASSISTANCE Description: STG Manage Bowel with Medication with Assistance. Outcome: Progressing   Problem: RH BLADDER ELIMINATION Goal: RH STG MANAGE BLADDER WITH ASSISTANCE Description: STG Manage Bladder With min  Assistance Outcome: Progressing   Problem: RH SAFETY Goal: RH STG ADHERE TO SAFETY PRECAUTIONS W/ASSISTANCE/DEVICE Description: STG Adhere to Safety Precautions With Assistance/Device. Outcome: Progressing   Problem: RH COGNITION-NURSING Goal: RH STG USES MEMORY AIDS/STRATEGIES W/ASSIST TO PROBLEM SOLVE Description: STG Uses Memory Aids/Strategies With cues/reminders Assistance to Problem Solve. Outcome: Progressing   Problem: RH PAIN MANAGEMENT Goal: RH STG PAIN MANAGED AT OR BELOW PT'S PAIN GOAL Description: At or below level 4 Outcome: Progressing   Problem: RH KNOWLEDGE DEFICIT Goal: RH STG INCREASE KNOWLEDGE OF DIABETES Description: Patient will be able to manage prediabetes with dietary modifications using handouts and educational tools with cues/reminders Outcome: Progressing Goal: RH STG INCREASE KNOWLEDGE OF HYPERTENSION Description: Patient will be able to manage HTN with medications and dietary modifications using handouts and educational tools with cues/reminders Outcome: Progressing Goal: RH STG INCREASE KNOWLEDGE OF STROKE PROPHYLAXIS Description: Patient will be able to manage secondary stroke risks with meds and dietary modifications using handouts and educational tools with cues/reminders Outcome: Progressing

## 2021-06-22 MED ORDER — BUTALBITAL-APAP-CAFFEINE 50-325-40 MG PO TABS
1.0000 | ORAL_TABLET | Freq: Four times a day (QID) | ORAL | Status: DC | PRN
  Administered 2021-06-29: 1 via ORAL
  Filled 2021-06-22: qty 1

## 2021-06-22 NOTE — Progress Notes (Signed)
Occupational Therapy Note  Patient Details  Name: Michaela Rios MRN: [id number] Date of Birth: [birth year]  Today's Date: 06/22/2021 OT Missed Time: 21 Minutes Missed Time Reason: Patient unwilling/refused to participate without medical reason;Other (comment) (pt declined group session)   Per rehab tech, pt declined attending group session, pt missed 60 mins of group session, will f/u as time allows to makeup missed minutes.    Corinne Ports [facility] 06/22/2021, 3:57 PM

## 2021-06-22 NOTE — Progress Notes (Signed)
Physical Therapy Session Note  Patient Details  Name: Michaela Rios MRN: [id number] Date of Birth: 12-Nov-1951  Today's Date: 06/22/2021 PT Individual Time: 1103-1200  PT Individual Time Calculation (min): 57 min    Short Term Goals: Week 1:  PT Short Term Goal 1 (Week 1): Pt will complete bed mobility MinA PT Short Term Goal 1 - Progress (Week 1): Met PT Short Term Goal 2 (Week 1): Pt will complete bed<>chair transfer MinA PT Short Term Goal 2 - Progress (Week 1): Met PT Short Term Goal 3 (Week 1): Pt will ambulate at least 74f ModA to progression towards ambulation LTG PT Short Term Goal 3 - Progress (Week 1): Met PT Short Term Goal 4 (Week 1): Pt will complete at least 4 stairs MinA PT Short Term Goal 4 - Progress (Week 1): Progressing toward goal PT Short Term Goal 5 (Week 1): Pt will consistently complete sit to stands with MinA PT Short Term Goal 5 - Progress (Week 1): Met Week 2:  PT Short Term Goal 1 (Week 2): Pt will complete bed<>chair transfer with consistent CGA and no AD. PT Short Term Goal 2 (Week 2): Pt will ambulate at least 1036fCGA to progress toward ambulation LTG PT Short Term Goal 3 (Week 2): Pt will complete at least 4 stairs MinA/ CGA. PT Short Term Goal 4 (Week 2): Pt will consistently complete sit to stands with supervision.   Skilled Therapeutic Interventions/Progress Updates:  Patient seated upright in w/c on entrance to room. Patient alert and agreeable to PT session, although she is relating moderate headache pain at start of session. By end of session, headache pain has resolved.  Therapeutic Activity: Transfers: Patient performed STS transfers throughout session with focus on no UE use and with supervision. SPVT transfers performed with use of at least one UE for positioning at seat  and supervisoin/ CGA. Provided verbal cues for completing turn prior to descending to sit.  Gait Training:  Patient ambulated 175 feet with no AD requiring CGA d/t  intermittent changes in gait speed/ forward step progression with changes in balance.  Demonstrated slow but accurate reactive response to shifts in balance. No complete LOB but shifting in weight forwd/ bkwd that change pt's momentum and gait speed throughout. On return ambulation to room, pt demos improvement in balance with better maintenance of forward shift of torso in order to promote forward momentum in gait. Provided vc/ tc throughout for proper weight shifting with minor changes in floor pitch as well as for forward balance shift.  Stair training facilitated with focus on completion of at least 5 steps and use of RHR only with decreased level of assist. Pt states there are no railings in front of home, so attempt today is to decrease use of railings. Pt is able to complete 2 bouts ascending/ descending of four 6" steps using RHR only with CGA and vc for technique intermittently. Then pt states that step are shorter and longer almost like platforms. Pt completes 3" steps using RHR only with minimal vc for technique.  Neuromuscular Re-ed: NMR facilitated during session with focus on standing dynamic balance. Pt guided in one UE supported lateral and forward lunges. NMR performed for improvements in motor control and coordination, balance, sequencing, judgement, and self confidence/ efficacy in performing all aspects of mobility at highest level of independence.   Patient completes SPVT from w/c to bed with supervision at end of session. Sit--> supine with supervision and pt  in bed at end  of session with brakes locked, bed alarm set, and all needs within reach. No c/o headache.     Therapy Documentation Precautions:  Precautions Precautions: Fall Precaution Comments: mild L hemiparesis Restrictions Weight Bearing Restrictions: No  Therapy/Group: Individual Therapy  Alger Simons PT, DPT 06/22/2021, 11:31 AM

## 2021-06-22 NOTE — Progress Notes (Signed)
PROGRESS NOTE   Subjective/Complaints:    ROS: Patient denies CP, SOB, N/V/D   Objective:   No results found. No results for input(s): WBC, HGB, HCT, PLT in the last 72 hours.  No results for input(s): NA, K, CL, CO2, GLUCOSE, BUN, CREATININE, CALCIUM in the last 72 hours.   Intake/Output Summary (Last 24 hours) at 06/22/2021 0854 Last data filed at 06/21/2021 1805 Gross per 24 hour  Intake 120 ml  Output --  Net 120 ml         Physical Exam: Vital Signs Blood pressure 138/90, pulse 85, temperature 98.4 F (36.9 C), temperature source Oral, resp. rate 16, height '5\' 3"'$  (1.6 m), weight 56.1 kg, SpO2 100 %.   General: No acute distress Mood and affect are appropriate Heart: Regular rate and rhythm no rubs murmurs or extra sounds Lungs: Clear to auscultation, breathing unlabored, no rales or wheezes Abdomen: Positive bowel sounds, soft nontender to palpation, nondistended Extremities: No clubbing, cyanosis, or edema    Skin: ecchymosis and swelling but no open areas dorsum of RIght hand Neurologic: Cranial nerves II through XII intact, motor strength is 4-5/5 in bilateral deltoid, bicep, tricep, grip, hip flexor, knee extensors, ankle dorsiflexor and plantar flexor. No sensory changes  Musculoskeletal: Full ROM, No pain with AROM or PROM in the neck, trunk, or extremities. Posture appropriate    Assessment/Plan: 1. Functional deficits which require 3+ hours per day of interdisciplinary therapy in a comprehensive inpatient rehab setting. Physiatrist is providing close team supervision and 24 hour management of active medical problems listed below. Physiatrist and rehab team continue to assess barriers to discharge/monitor patient progress toward functional and medical goals  Care Tool:  Bathing    Body parts bathed by patient: Right arm, Left arm, Chest, Face, Front perineal area, Abdomen, Right upper leg,  Left upper leg, Buttocks   Body parts bathed by helper: Buttocks     Bathing assist Assist Level: Contact Guard/Touching assist     Upper Body Dressing/Undressing Upper body dressing   What is the patient wearing?: Pull over shirt    Upper body assist Assist Level: Supervision/Verbal cueing    Lower Body Dressing/Undressing Lower body dressing      What is the patient wearing?: Pants, Underwear/pull up     Lower body assist Assist for lower body dressing: Contact Guard/Touching assist     Toileting Toileting    Toileting assist Assist for toileting: Contact Guard/Touching assist     Transfers Chair/bed transfer  Transfers assist     Chair/bed transfer assist level: Contact Guard/Touching assist     Locomotion Ambulation   Ambulation assist      Assist level: Minimal Assistance - Patient > 75% Assistive device: No Device Max distance: 60   Walk 10 feet activity   Assist     Assist level: Contact Guard/Touching assist Assistive device: No Device   Walk 50 feet activity   Assist Walk 50 feet with 2 turns activity did not occur: Safety/medical concerns  Assist level: Minimal Assistance - Patient > 75% Assistive device: No Device    Walk 150 feet activity   Assist Walk 150 feet activity did not occur:  Safety/medical concerns  Assist level: Minimal Assistance - Patient > 75% Assistive device: No Device    Walk 10 feet on uneven surface  activity   Assist Walk 10 feet on uneven surfaces activity did not occur: Safety/medical concerns         Wheelchair     Assist Will patient use wheelchair at discharge?: No   Wheelchair activity did not occur: N/A         Wheelchair 50 feet with 2 turns activity    Assist    Wheelchair 50 feet with 2 turns activity did not occur: N/A       Wheelchair 150 feet activity     Assist  Wheelchair 150 feet activity did not occur: N/A       Blood pressure 138/90, pulse 85,  temperature 98.4 F (36.9 C), temperature source Oral, resp. rate 16, height '5\' 3"'$  (1.6 m), weight 56.1 kg, SpO2 100 %.   Medical Problem List and Plan: 1.  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  Antithrombotics: -DVT/anticoagulation:  Pharmaceutical: Lovenox             -antiplatelet therapy: DAPT X 3 weeks followed by ASA alone.  3. Pain Management: Tylenol prn. Has migraines- cannot take Imitrex- generally controlled but had one ~6p yesterday that lasted >1h- monitor on current meds   Some breakthrough HA topamax '100mg'$  BID, will try fioricet in place of hydrocodone 4. Mood: LCSW to follow for evaluation and support.              -antipsychotic agents: N/A 5. Neuropsych: This patient is not  capable of making decisions on his own behalf. 6. Skin/Wound Care: Routine pressure relief measures.  7. Fluids/Electrolytes/Nutrition: encourage PO 8. HTN: Monitor BP tid-- Vitals:   06/21/21 2008 06/22/21 0428  BP: 130/87 138/90  Pulse: 87 85  Resp: 16 16  Temp: 98.4 F (36.9 C) 98.4 F (36.9 C)  SpO2: 97% 100%   7/26 controlled  9. CKD III: Has been incontinent since admission. Will monitor voiding with PVR checkss.             --set toileting schedule.  7/17- voiding some on toilet- incontinence gradually improving- con't voiding/toileting schedule 10. H/o depression/anxiety: Managed with Effexor, Wellbutrin and lorazepam prn. 11. H/o Migraines: controlled on Topamax.  Has had 4x since admission- may use Nurtec prn since this is not a vasoconstrictor; increased Topamax to '100mg'$  BID.    12. RML nodule: Recommend repeat CT in 12 months. 13. Hyponatremia: Continue to monitor for now 14. Acute on chronic anemia of chronic disease:  Hgb 11.6 at admission-->9.9 >10.6>9.7 15. FUO: WBC wnl             --Continue to  monitor for fevers or other signs of infection. 16. Poor appetite- eating 50-100% currently 17. Constipation vs poor appetite- LBM 6 days ago- suggest KUB.    Moved bowels yesterday  18.  Somnolence ,improved  pt with poor sleep  ambien    -seems improved   LOS: 14 days A FACE TO FACE EVALUATION WAS PERFORMED  Charlett Blake 06/22/2021, 8:54 AM

## 2021-06-22 NOTE — Progress Notes (Signed)
Speech Language Pathology Weekly Progress and Session Note  Patient Details  Name: Michaela Rios MRN: [id number] Date of Birth: 1951/06/13  Beginning of progress report period: June 16, 2021 End of progress report period: June 22, 2021  Today's Date: 06/22/2021 SLP Individual Time: 0923-3007 SLP Individual Time Calculation (min): 45 min  Short Term Goals: Week 2: SLP Short Term Goal 1 (Week 2): Patient will demonstrate selective attention to functional tasks in a mildly-to-moderately distracting environment for 20 minutes with min A cues for re-direction SLP Short Term Goal 1 - Progress (Week 2): Progressing toward goal SLP Short Term Goal 2 (Week 2): Patient will attend to left field of environment during functional tasks with sup A cues SLP Short Term Goal 2 - Progress (Week 2): Met SLP Short Term Goal 3 (Week 2): Patient will demonstrate functional problem solving for complex tasks with min-sup A cues SLP Short Term Goal 3 - Progress (Week 2): Progressing toward goal SLP Short Term Goal 4 (Week 2): Patient will demonstrate recent recall (20 minutes or greater) of novel information with min A verbal cues with use of compensatory strategies SLP Short Term Goal 4 - Progress (Week 2): Met SLP Short Term Goal 5 (Week 2): Patient will demonstrate improved awareness by identifying and repairing errors with sup A cues. SLP Short Term Goal 5 - Progress (Week 2): Met    New Short Term Goals: Week 3: SLP Short Term Goal 1 (Week 3): STG=LTG d/t ELOS  Weekly Progress Updates: Patient has met 3 of 5 short term goals this reporting period. Patient has made functional progress with ST over the past week. Patient is currently completing cognitive-linguistic tasks with min-to-mod A verbal and visual cues for basic verbal and functional tasks, and mod A for complex cognitive task including higher level executive functions such as organization and planning. She continues to benefit from verbal  reminders to refer to compensatory memory aids for functional recall and continues to progress with sustained attention, working memory, and overall short-term memory. Patient requires a quiet and low stimulating environment for functional participation as she continues to endorse distractibility by external stimuli which is new s/p CVA per report, and benefits from additional processing time and verbal repetition to promote information processing. Patient/family education is ongoing. Recommend continued skilled ST intervention to maximize cognitive function and functional independence prior to discharge. Continue progression toward established LTGs set at sup-to-min A assist level overall.    Intensity: Minumum of 1-2 x/day, 30 to 90 minutes Frequency: 3 to 5 out of 7 days Duration/Length of Stay: 8/4 Treatment/Interventions: Cognitive remediation/compensation;Environmental controls;Internal/external aids;Cueing hierarchy;Functional tasks;Medication managment;Patient/family education;Therapeutic Activities   Daily Session Skilled Therapeutic Interventions: Patient agreeable to skilled ST intervention with focus on cognitive goals. Patient required additional processing time and verbal explanation for comprehension of today's tasks. Suspect this was attributed to sleepiness. Facilitated working memory task with word list retention of 4 words and answering questions pertaining to the list. Patient completed with sup A verbal cues for recall. Facilitated visual recall with names and faces. Patient was able to recall 3/5 independently, and 5/5 with min A semantic and/or first letter cues during subsequent trials. Facilitated visual recall with a detailed picture scene. Patient successfully recalled 85% of information with min A verbal cues. Patient was left in bed with alarm activated and immediate needs within reach at end of session. Continue per current plan of care.     General    Pain Pain  Assessment Pain Scale: 0-10  Pain Score: 0-No pain  Therapy/Group: Individual Therapy  Patty Sermons 06/22/2021, 12:34 PM

## 2021-06-22 NOTE — Progress Notes (Signed)
Occupational Therapy Session Note  Patient Details  Name: Michaela Rios MRN: [id number] Date of Birth: [birth year]  Today's Date: 06/22/2021 OT Individual Time: 0929-1000 OT Individual Time Calculation (min): 31 min    Short Term Goals: Week 2:  OT Short Term Goal 1 (Week 2): Pt will don pants with min A. OT Short Term Goal 2 (Week 2): Pt will complete 2/3 steps of toileting with CGA. OT Short Term Goal 3 (Week 2): Pt will complete ambulatory toilet transfer min A + LRAD.  Skilled Therapeutic Interventions/Progress Updates:    Pt received semi-reclined in bed, denies pain currently, agreeable to therapy. Session focus on self-care retraining, activity tolerance, func transfers in prep for improved ADL/IADL/func mobility performance + decreased caregiver burden. Came to sitting EOB with close S. Donned B shoes close S. STS with CGA and amb to and from toilet with no AD and min HHA. CGA during LB clothing management + pericare in standing, light min A to keep pants from falling to floor.  Continent void of bladder. Seated at sink, completed UBB/UBD and seated grooming distant S and assist to gather items. Doffed/donned pants with CGA for STS at sink and increased time, req cuing to initiate. Elected to remain seated to complete oral care with distant S.   Pt left seated in w/c with safety belt alarm engaged, call bell in reach, and all immediate needs met.    Therapy Documentation Precautions:  Precautions Precautions: Fall Precaution Comments: mild L hemiparesis Restrictions Weight Bearing Restrictions: No  Pain: denies   ADL: See Care Tool for more details.    Therapy/Group: Individual Therapy  Volanda Napoleon MS, OTR/L  06/22/2021, 6:49 AM

## 2021-06-23 ENCOUNTER — Ambulatory Visit: Payer: Medicare HMO

## 2021-06-23 LAB — CBC
HCT: 32.3 % — ABNORMAL LOW (ref 36.0–46.0)
Hemoglobin: 10.4 g/dL — ABNORMAL LOW (ref 12.0–15.0)
MCH: 29.5 pg (ref 26.0–34.0)
MCHC: 32.2 g/dL (ref 30.0–36.0)
MCV: 91.5 fL (ref 80.0–100.0)
Platelets: 345 10*3/uL (ref 150–400)
RBC: 3.53 MIL/uL — ABNORMAL LOW (ref 3.87–5.11)
RDW: 16.9 % — ABNORMAL HIGH (ref 11.5–15.5)
WBC: 3.8 10*3/uL — ABNORMAL LOW (ref 4.0–10.5)
nRBC: 0 % (ref 0.0–0.2)

## 2021-06-23 LAB — BASIC METABOLIC PANEL
Anion gap: 9 (ref 5–15)
BUN: 8 mg/dL (ref 8–23)
CO2: 20 mmol/L — ABNORMAL LOW (ref 22–32)
Calcium: 8.8 mg/dL — ABNORMAL LOW (ref 8.9–10.3)
Chloride: 109 mmol/L (ref 98–111)
Creatinine, Ser: 0.8 mg/dL (ref 0.44–1.00)
GFR, Estimated: >60 mL/min (ref >60)
Glucose, Bld: 96 mg/dL (ref 70–99)
Potassium: 3.3 mmol/L — ABNORMAL LOW (ref 3.5–5.1)
Sodium: 138 mmol/L (ref 135–145)

## 2021-06-23 MED ORDER — POTASSIUM CHLORIDE CRYS ER 10 MEQ PO TBCR
10.0000 meq | EXTENDED_RELEASE_TABLET | Freq: Every day | ORAL | Status: DC
  Administered 2021-06-23 – 2021-07-01 (×9): 10 meq via ORAL
  Filled 2021-06-23 (×9): qty 1

## 2021-06-23 NOTE — Progress Notes (Signed)
Physical Therapy Session Note  Patient Details  Name: Michaela Rios MRN: [id number] Date of Birth: 1951/11/27  Today's Date: 06/23/2021 PT Individual Time: 1040-1205 PT Individual Time Calculation (min): 85 min   Short Term Goals: Week 2:  PT Short Term Goal 1 (Week 2): Pt will complete bed<>chair transfer with consistent CGA and no AD. PT Short Term Goal 2 (Week 2): Pt will ambulate at least 166f CGA to progress toward ambulation LTG PT Short Term Goal 3 (Week 2): Pt will complete at least 4 stairs MinA/ CGA. PT Short Term Goal 4 (Week 2): Pt will consistently complete sit to stands with supervision.  Skilled Therapeutic Interventions/Progress Updates:    Pt received sitting wc and agreeable to therapy. Pt states no pain.   STS during session completed with CGA to close supervision and verbal cuing for weight shifting. One instance of requiring second attempt secondary to decreased anterior weight shifting.   Biodex Lateral, anterior/posterior, and limits of stability modes utilized for visual feedback during multidirectional weight shift training. Limits of stability trialed 3x at 24%, 41%, and 37% accuracy.  Pt demonstrated decreased right lateral weight shifting and limited anterior/posterior weight shifting requiring increased verbal cuing and intermittent min manual facilitation.   3" steps completed 1x6 bilateral HR CGA and 1x3 no HR MinA completed with pt education on importance of stair training prior to return to home with added benefit of balance training for patient buy in.   Kickball to targets in BGueydandiagonal patterns to promote weight shifting with reaching high and low 1x10 each side CGA.  Pt demonstrates increased time and effort to complete low target placed on the right side and requiring intermittent MinA for steadying balance. Anterior/Posterior weight shifting with step to/from tandem stance with placing ball to hoop. CGA stepping with RLE and CGA/intermittent MinA  stepping with LLE. Verbal cuing for sequencing throughout.   Gait training completed with several longer bouts, longest duration ~2230fCGA. Verbal cuing for increased gait velocity, bilateral step length, and navigation of objects on left. Obstacle course completed weaving through cones and stepping over hurdles/dumbbell for varying heights. MinA for steadying balance with stepping over hurdles (LLE>RLE). Pt demonstrated increased difficulty leading with LLE over obstacles.  Pt completed toileting task CGA while performing peri care and clothing management.   Vitals taken during session secondary to pt reporting "wooziness" with activities that improved with rest breaks. Vitals taken in sitting post ambulation from bathroom BP 109/91 and pulse 95.   Pt sitting in wc, brakes locked, seatbelt alarm on, and call bell within reach.   Therapy Documentation Precautions:  Precautions Precautions: Fall Precaution Comments: mild L hemiparesis Restrictions Weight Bearing Restrictions: No  Pain: Pain Assessment Pain Scale: 0-10 Pain Score: 0-No pain Faces Pain Scale: No hurt     Therapy/Group: Individual Therapy  Dalya Maselli, SPT 06/23/2021, 1:37 PM

## 2021-06-23 NOTE — Progress Notes (Signed)
Patient ID: Michaela Rios, female   DOB: [birth year], 70 y.o.   MRN: [id number] Team Conference Report to Patient/Family  Team Conference discussion was reviewed with the patient and caregiver, including goals, any changes in plan of care and target discharge date.  Patient and caregiver express understanding and are in agreement.  The patient has a target discharge date of 07/01/21.  SW met with patient. Provided conference updates. Patient happy about progress. No additional concerns, sw will continue to follow up. Dyanne Iha 06/23/2021, 1:54 PM

## 2021-06-23 NOTE — Progress Notes (Signed)
Physical Therapy Session Note  Patient Details  Name: Michaela Rios MRN: [id number] Date of Birth: 1951-03-14  Today's Date: 06/23/2021 PT Individual Time: 0900-0935 PT Individual Time Calculation (min): 35 min   Short Term Goals: Week 2:  PT Short Term Goal 1 (Week 2): Pt will complete bed<>chair transfer with consistent CGA and no AD. PT Short Term Goal 2 (Week 2): Pt will ambulate at least 1102ft CGA to progress toward ambulation LTG PT Short Term Goal 3 (Week 2): Pt will complete at least 4 stairs MinA/ CGA. PT Short Term Goal 4 (Week 2): Pt will consistently complete sit to stands with supervision.  Skilled Therapeutic Interventions/Progress Updates: Pt presented at sink with nsg present agreeable to therapy. Pt denies pain at start of sessio. Pt ambulated to EOB with RW and close S and took am meds and changed shirt while sitting EOB. Pt also donned shoes with set up at EOB. Once completed pt ambulated without AD to day room and CGA. Pt required verbal cues for increasing step length, gait speed and to increase arm swing with ambulation. In day room pt participated in [place] activities for wt shifting and forced use of LLE. Participated in step overs 4in hurdle alternating LE requiring CGA overall. Pt encouraged to promote wt shifting on LLE. Pt with x 1 LOB posteriorly requiring minA from PTA for recovery. Pt also participated in obstacle course stepping over 4in hurdles and weaving through cones with CGA. Pt did require verbal cues for increased trunk rotation when performing weaving task. Pt also participated in ambulation backwards 46ft x 2 with CGA and verbal cues for increased step length to promote step through pattern. After brief rest pt ambulated back to room CGA without AD. Pt left in w/c at end of session with belt alarm on, call bell within reach and needs met.      Therapy Documentation Precautions:  Precautions Precautions: Fall Precaution Comments: mild L  hemiparesis Restrictions Weight Bearing Restrictions: No General:   Vital Signs:   Pain: Pain Assessment Pain Scale: 0-10 Pain Score: 0-No pain   Therapy/Group: Individual Therapy  Francys Bolin 06/23/2021, 9:51 AM

## 2021-06-23 NOTE — Progress Notes (Signed)
Occupational Therapy Weekly Progress Note  Patient Details  Name: Michaela Rios MRN: [id number] Date of Birth: [birth year]  Beginning of progress report period: June 16, 2021 End of progress report period: June 23, 2021  Today's Date: 06/23/2021 OT Individual Time: 0272-5366 OT Individual Time Calculation (min): 38 min    Patient has met 3 of 3 short term goals.  Pt has made good progress this week in OT. Pt has demonstrated improved initiation, dynamic standing balance, activity tolerance, safety awareness, activity tolerance to presently complete UB ADL at set-up level and LBD ADL at [facility] level, ambulatory transfers to toilet currently min A to CGA with no AD. Pt cont to be primarily limited by impaired gait/dynamic standing balance, initiation, generalized weakness/deconditioning. LUE and hand currently assessed at [place] level V and VI respectively. Anticipate intermittent S required upon DC.   Patient continues to demonstrate the following deficits: muscle weakness, decreased cardiorespiratoy endurance, unbalanced muscle activation, decreased coordination, and decreased motor planning, decreased initiation, decreased problem solving, and delayed processing, and decreased standing balance, decreased postural control, and hemiplegia and therefore will continue to benefit from skilled OT intervention to enhance overall performance with BADL, iADL, and Reduce care partner burden.  Patient progressing toward long term goals..  Continue plan of care.  OT Short Term Goals Week 3:  OT Short Term Goal 1 (Week 3): STG = LTG 2/2 ELOS  Skilled Therapeutic Interventions/Progress Updates:    Pt received seated in w/c, denies pain, agreeable to therapy. Session focus on self-care / IADLretraining, activity tolerance, dynamic standing balance, func transfers in prep for improved ADL/IADL/func mobility performance + decreased caregiver burden. Total A w/c transport down to hospital gift shop 2/2  time management and energy conservation. Given a list of 5 items verbally x2, pt able to locate all items with overall min A to find items and recall 2/5 items in gift shop. CGA ot min A for balance with no AD throughout. In kitchen, gathered and completed steps to make jello and clean up dishes with overall CGA for balance. Discussed kitchen set-up at home to decrease falls risk and to facilitate participation in light meal prep. Pt opted to use hot water from sink vs boiling water, stating she didn't forget, but anticipated her husband wouldn't let her use the stove upon DC. Endorses leg "shakiness" but req to amb back to room ~20 ft with cga. Returned to supine in bed distant S.   Pt left semi-reclined in bed with bed alarm engaged, call bell in reach, and all immediate needs met.    Therapy Documentation Precautions:  Precautions Precautions: Fall Precaution Comments: mild L hemiparesis Restrictions Weight Bearing Restrictions: No  Pain: denies   ADL: See Care Tool for more details.   Therapy/Group: Individual Therapy  Volanda Napoleon MS, OTR/L  06/23/2021, 6:46 AM

## 2021-06-23 NOTE — Patient Care Conference (Signed)
Inpatient RehabilitationTeam Conference and Plan of Care Update Date: 06/23/2021   Time: 10:34 AM    Patient Name: Michaela Rios      Medical Record Number: [id number]  Date of Birth: 1951-02-24 Sex: Female         Room/Bed: 4W24C/4W24C-01 Payor Info: Payor: [employer] MEDICARE / Plan: [place] MEDICARE HMO/PPO / Product Type: *No Product type* /    Admit Date/Time:  06/08/2021  5:45 PM  Primary Diagnosis:  Embolic stroke of right basal ganglia [facility])  Hospital Problems: Principal Problem:   Embolic stroke of right basal ganglia [facility]) Active Problems:   Thalamic stroke [facility])    Expected Discharge Date: Expected Discharge Date: 07/01/21  Team Members Present: Physician leading conference: Dr. Alysia Penna Social Worker Present: Erlene Quan, BSW Nurse Present: Dorien Chihuahua, RN PT Present: Barrie Folk, PT OT Present: Providence Lanius, OT SLP Present: Sherren Kerns, SLP PPS Coordinator present : Gunnar Fusi, SLP     Current Status/Progress Goal Weekly Team Focus  Bowel/Bladder   (P) Patient is Continent of bladder/bowel LBM 06/21/2021  (P) gain full cont.  (P) Assess QS/PRN   Swallow/Nutrition/ Hydration   not addressing - WFL  not addressing - WFL  not addressing - WFL   ADL's   S to set-up UBD, UBB, seated grooming; CGA toilet transfer ambulatory, toileting tasks, CGA bathing at sink  S for transfers and ADL  LUE NMR, balance/self-care/transfer retraining, pt/family education, activity tolerance, energy conservation, AE/DME education   Mobility   Bed mobility = supervision, Transfers = supervision/ CGA, Gait = CGA/ intermittent Min A for balance  Overall SUP  L hemibody NMR, standing balance training, reduced LOA for bed mobility transfers and gait, improved quality of gait, increased activity tolerance and strength, continue stair training, initiate family education   Communication   not addressing - WFL  not addressing -WFL  not addressing - WFL    Safety/Cognition/ Behavioral Observations  min A, mod A for complex cognitive tasks  Sup for problem solving and awareness, min A for memory and attention  executive functions, problem solving, error awareness, attention   Pain   (P) Denies pain occassional headache with schedule and prn medication administered  Pain < 3  Pain assess QS/PRN   Skin   Skin intact  free of breakdown, maintain skin integrity  QS/PRN assess with f/u     Discharge Planning:  Discharging home with sister 24/7 supervision- Min A, prefers not to assist with toileting task.   Team Discussion: BP stable. Steady progress noted, less flat affect. Progress limited by external distractions, memory, and poor attention. Patient on target to meet rehab goals: yes, currently supervision - et up for upper body bathing and dressing and self feeding. CGA  for toilet transfers without and assistive device and bathing at a sink level. Supervision - CGA for gait without an assistive device. Min assist for cognitive tasks, mod assist for complex executive functions with supervision goals set for discharge.   *See Care Plan and progress notes for long and short-term goals.   Revisions to Treatment Plan:    Teaching Needs: Safety, transfers, toileting, medications, secondary stroke risk management, etc.  Current Barriers to Discharge: Decreased caregiver support and Home enviroment access/layout  Possible Resolutions to Barriers: Family education     Medical Summary Current Status: intermittent HA, intermittent incontinence  Barriers to Discharge: Incontinence;Other (comments)  Barriers to Discharge Comments: hx Migraines, manage incont Possible Resolutions to [employer] Focus: family training , toileting to improve  continence   Continued Need for Acute Rehabilitation Level of Care: The patient requires daily medical management by a physician with specialized training in physical medicine and rehabilitation for the  following reasons: Direction of a multidisciplinary physical rehabilitation program to maximize functional independence : Yes Medical management of patient stability for increased activity during participation in an intensive rehabilitation regime.: Yes Analysis of laboratory values and/or radiology reports with any subsequent need for medication adjustment and/or medical intervention. : Yes   I attest that I was present, lead the team conference, and concur with the assessment and plan of the team.   Dorien Chihuahua B 06/23/2021, 3:54 PM

## 2021-06-23 NOTE — Progress Notes (Signed)
Speech Language Pathology Daily Session Note  Patient Details  Name: Michaela Rios MRN: [id number] Date of Birth: December 26, 1950  Today's Date: 06/23/2021 SLP Individual Time: 1500-1530 SLP Individual Time Calculation (min): 30 min  Short Term Goals: Week 3: SLP Short Term Goal 1 (Week 3): STG=LTG d/t ELOS  Skilled Therapeutic Interventions: Patient agreeable to skilled ST intervention with focus on cognitive goals. SLP facilitated complex problem solving, reasoning, and recall skills with deductive reasoning task. Patient completed with mod A fading to min-to-mod A verbal and visual cues for effective organization, planning, and recall. Patient was left in bed with alarm activated and immediate needs within reach at end of session. Continue per current plan of care.      Pain Pain Assessment Pain Scale: 0-10 Pain Score: 0-No pain Faces Pain Scale: No hurt  Therapy/Group: Individual Therapy  Patty Sermons 06/23/2021, 4:13 PM

## 2021-06-23 NOTE — Progress Notes (Signed)
PROGRESS NOTE   Subjective/Complaints:  Reviewed labwork, discussed K+ level. Pt wants me to look at her Right tricep area , wonders if there is a bump there, no pain  ROS: Patient denies CP, SOB, N/V/D   Objective:   No results found. Recent Labs    06/23/21 0502  WBC 3.8*  HGB 10.4*  HCT 32.3*  PLT 345    Recent Labs    06/23/21 0502  NA 138  K 3.3*  CL 109  CO2 20*  GLUCOSE 96  BUN 8  CREATININE 0.80  CALCIUM 8.8*     Intake/Output Summary (Last 24 hours) at 06/23/2021 1124 Last data filed at 06/23/2021 0735 Gross per 24 hour  Intake 700 ml  Output --  Net 700 ml         Physical Exam: Vital Signs Blood pressure 132/87, pulse 90, temperature 98.1 F (36.7 C), resp. rate 20, height '5\' 3"'$  (1.6 m), weight 56.1 kg, SpO2 99 %.   General: No acute distress Mood and affect are appropriate Heart: Regular rate and rhythm no rubs murmurs or extra sounds Lungs: Clear to auscultation, breathing unlabored, no rales or wheezes Abdomen: Positive bowel sounds, soft nontender to palpation, nondistended Extremities: No clubbing, cyanosis, or edema, no masses or lesions RIght tricep area Skin: No evidence of breakdown, no evidence of rash   Skin: ecchymosis and swelling but no open areas dorsum of RIght hand Neurologic: Cranial nerves II through XII intact, motor strength is 4-5/5 in bilateral deltoid, bicep, tricep, grip, hip flexor, knee extensors, ankle dorsiflexor and plantar flexor. No sensory changes  Musculoskeletal: Full ROM, No pain with AROM or PROM in the neck, trunk, or extremities. Posture appropriate    Assessment/Plan: 1. Functional deficits which require 3+ hours per day of interdisciplinary therapy in a comprehensive inpatient rehab setting. Physiatrist is providing close team supervision and 24 hour management of active medical problems listed below. Physiatrist and rehab team continue to  assess barriers to discharge/monitor patient progress toward functional and medical goals  Care Tool:  Bathing    Body parts bathed by patient: Right arm, Left arm, Chest, Face, Front perineal area, Abdomen, Right upper leg, Left upper leg, Buttocks   Body parts bathed by helper: Buttocks     Bathing assist Assist Level: Contact Guard/Touching assist     Upper Body Dressing/Undressing Upper body dressing   What is the patient wearing?: Pull over shirt    Upper body assist Assist Level: Supervision/Verbal cueing    Lower Body Dressing/Undressing Lower body dressing      What is the patient wearing?: Pants, Underwear/pull up     Lower body assist Assist for lower body dressing: Contact Guard/Touching assist     Toileting Toileting    Toileting assist Assist for toileting: Contact Guard/Touching assist     Transfers Chair/bed transfer  Transfers assist     Chair/bed transfer assist level: Contact Guard/Touching assist     Locomotion Ambulation   Ambulation assist      Assist level: Minimal Assistance - Patient > 75% Assistive device: No Device Max distance: 60   Walk 10 feet activity   Assist  Assist level: Contact Guard/Touching assist Assistive device: No Device   Walk 50 feet activity   Assist Walk 50 feet with 2 turns activity did not occur: Safety/medical concerns  Assist level: Minimal Assistance - Patient > 75% Assistive device: No Device    Walk 150 feet activity   Assist Walk 150 feet activity did not occur: Safety/medical concerns  Assist level: Minimal Assistance - Patient > 75% Assistive device: No Device    Walk 10 feet on uneven surface  activity   Assist Walk 10 feet on uneven surfaces activity did not occur: Safety/medical concerns         Wheelchair     Assist Will patient use wheelchair at discharge?: No   Wheelchair activity did not occur: N/A         Wheelchair 50 feet with 2 turns  activity    Assist    Wheelchair 50 feet with 2 turns activity did not occur: N/A       Wheelchair 150 feet activity     Assist  Wheelchair 150 feet activity did not occur: N/A       Blood pressure 132/87, pulse 90, temperature 98.1 F (36.7 C), resp. rate 20, height '5\' 3"'$  (1.6 m), weight 56.1 kg, SpO2 99 %.   Medical Problem List and Plan: 1.  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  Antithrombotics: -DVT/anticoagulation:  Pharmaceutical: Lovenox             -antiplatelet therapy: DAPT X 3 weeks followed by ASA alone.  3. Pain Management: Tylenol prn. Has migraines- cannot take Imitrex- generally controlled but had one ~6p yesterday that lasted >1h- monitor on current meds   Some breakthrough HA topamax '100mg'$  BID, will try fioricet in place of hydrocodone 4. Mood: LCSW to follow for evaluation and support.              -antipsychotic agents: N/A 5. Neuropsych: This patient is not  capable of making decisions on his own behalf. 6. Skin/Wound Care: Routine pressure relief measures.  7. Fluids/Electrolytes/Nutrition: encourage PO, HypoK+no diuretics, rare effect of topamax will supplement and recheck  8. HTN: Monitor BP tid-- Vitals:   06/22/21 1935 06/23/21 0440  BP: 134/87 132/87  Pulse: 94 90  Resp: 20 20  Temp: 98.2 F (36.8 C) 98.1 F (36.7 C)  SpO2: 97% 99%   7/27 controlled  9. CKD III: Has been incontinent since admission. Will monitor voiding with PVR checkss.             --set toileting schedule.  7/17- voiding some on toilet- incontinence gradually improving- con't voiding/toileting schedule 10. H/o depression/anxiety: Managed with Effexor, Wellbutrin and lorazepam prn. 11. H/o Migraines: controlled on Topamax.  Has had 4x since admission- may use Nurtec prn since this is not a  vasoconstrictor; increased Topamax to '100mg'$  BID.    12. RML nodule: Recommend repeat CT in 12 months. 13. Hyponatremia: Continue to monitor for now 14. Acute on chronic anemia of chronic disease:  Hgb 11.6 at admission-->9.9 >10.6>9.7>10.4 15. FUO: WBC wnl             --Continue to monitor for fevers or other signs of infection. 16.  Poor appetite- eating 50-100% currently 17. Constipation vs poor appetite- LBM 6 days ago- suggest KUB.    Moved bowels yesterday  18.  Somnolence ,improved  pt with poor sleep  ambien    -seems improved   LOS: 15 days A FACE TO FACE EVALUATION WAS PERFORMED  Charlett Blake 06/23/2021, 11:24 AM

## 2021-06-24 NOTE — Progress Notes (Signed)
PROGRESS NOTE   Subjective/Complaints:  Slept well did not need med for HA yesterday   ROS: Patient denies CP, SOB, N/V/D   Objective:   No results found. Recent Labs    06/23/21 0502  WBC 3.8*  HGB 10.4*  HCT 32.3*  PLT 345     Recent Labs    06/23/21 0502  NA 138  K 3.3*  CL 109  CO2 20*  GLUCOSE 96  BUN 8  CREATININE 0.80  CALCIUM 8.8*      Intake/Output Summary (Last 24 hours) at 06/24/2021 0647 Last data filed at 06/24/2021 [id number] Gross per 24 hour  Intake 720 ml  Output --  Net 720 ml         Physical Exam: Vital Signs Blood pressure 122/81, pulse 86, temperature (!) 97.5 F (36.4 C), temperature source Oral, resp. rate 18, height '5\' 3"'$  (1.6 m), weight 56.1 kg, SpO2 98 %.   General: No acute distress Mood and affect are appropriate Heart: Regular rate and rhythm no rubs murmurs or extra sounds Lungs: Clear to auscultation, breathing unlabored, no rales or wheezes Abdomen: Positive bowel sounds, soft nontender to palpation, nondistended Extremities: No clubbing, cyanosis, or edema, no masses or lesions RIght tricep area Skin: No evidence of breakdown, no evidence of rash   Skin: ecchymosis and swelling but no open areas dorsum of RIght hand Neurologic: Cranial nerves II through XII intact, motor strength is 4-5/5 in bilateral deltoid, bicep, tricep, grip, hip flexor, knee extensors, ankle dorsiflexor and plantar flexor. No sensory changes  Musculoskeletal: Full ROM, No pain with AROM or PROM in the neck, trunk, or extremities. Posture appropriate    Assessment/Plan: 1. Functional deficits which require 3+ hours per day of interdisciplinary therapy in a comprehensive inpatient rehab setting. Physiatrist is providing close team supervision and 24 hour management of active medical problems listed below. Physiatrist and rehab team continue to assess barriers to discharge/monitor patient  progress toward functional and medical goals  Care Tool:  Bathing    Body parts bathed by patient: Right arm, Left arm, Chest, Face, Front perineal area, Abdomen, Right upper leg, Left upper leg, Buttocks   Body parts bathed by helper: Buttocks     Bathing assist Assist Level: Contact Guard/Touching assist     Upper Body Dressing/Undressing Upper body dressing   What is the patient wearing?: Pull over shirt    Upper body assist Assist Level: Supervision/Verbal cueing    Lower Body Dressing/Undressing Lower body dressing      What is the patient wearing?: Pants, Underwear/pull up     Lower body assist Assist for lower body dressing: Contact Guard/Touching assist     Toileting Toileting    Toileting assist Assist for toileting: Contact Guard/Touching assist     Transfers Chair/bed transfer  Transfers assist     Chair/bed transfer assist level: Contact Guard/Touching assist     Locomotion Ambulation   Ambulation assist      Assist level: Minimal Assistance - Patient > 75% Assistive device: No Device Max distance: 60   Walk 10 feet activity   Assist     Assist level: Contact Guard/Touching assist Assistive device: No Device  Walk 50 feet activity   Assist Walk 50 feet with 2 turns activity did not occur: Safety/medical concerns  Assist level: Minimal Assistance - Patient > 75% Assistive device: No Device    Walk 150 feet activity   Assist Walk 150 feet activity did not occur: Safety/medical concerns  Assist level: Minimal Assistance - Patient > 75% Assistive device: No Device    Walk 10 feet on uneven surface  activity   Assist Walk 10 feet on uneven surfaces activity did not occur: Safety/medical concerns         Wheelchair     Assist Will patient use wheelchair at discharge?: No   Wheelchair activity did not occur: N/A         Wheelchair 50 feet with 2 turns activity    Assist    Wheelchair 50 feet with 2  turns activity did not occur: N/A       Wheelchair 150 feet activity     Assist  Wheelchair 150 feet activity did not occur: N/A       Blood pressure 122/81, pulse 86, temperature (!) 97.5 F (36.4 C), temperature source Oral, resp. rate 18, height '5\' 3"'$  (1.6 m), weight 56.1 kg, SpO2 98 %.   Medical Problem List and Plan: 1.  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  Antithrombotics: -DVT/anticoagulation:  Pharmaceutical: Lovenox             -antiplatelet therapy: DAPT X 3 weeks followed by ASA alone.  3. Pain Management: Tylenol prn. Has migraines- cannot take Imitrex- generally controlled but had one ~6p yesterday that lasted >1h- monitor on current meds   Some breakthrough HA topamax '100mg'$  BID, will try fioricet in place of hydrocodone 4. Mood: LCSW to follow for evaluation and support.              -antipsychotic agents: N/A 5. Neuropsych: This patient is not  capable of making decisions on his own behalf. 6. Skin/Wound Care: Routine pressure relief measures.  7. Fluids/Electrolytes/Nutrition: encourage PO, HypoK+no diuretics, rare effect of topamax will supplement and recheck  8. HTN: Monitor BP tid-- Vitals:   06/23/21 1948 06/24/21 0628  BP: 128/85 122/81  Pulse: 93 86  Resp: 20 18  Temp: 98.2 F (36.8 C) (!) 97.5 F (36.4 C)  SpO2: 94% 98%   7/28 controlled  9. CKD III: Has been incontinent since admission. Will monitor voiding with PVR checkss.             --set toileting schedule.  7/17- voiding some on toilet- incontinence gradually improving- con't voiding/toileting schedule 10. H/o depression/anxiety: Managed with Effexor, Wellbutrin and lorazepam prn. 11. H/o Migraines: controlled on Topamax.  Has had 4x since admission- may use Nurtec prn since this is not a vasoconstrictor;  increased Topamax to '100mg'$  BID.    12. RML nodule: Recommend repeat CT in 12 months. 13. Hyponatremia: Continue to monitor for now 14. Acute on chronic anemia of chronic disease:  Hgb 11.6 at admission-->9.9 >10.6>9.7>10.4 15. FUO: WBC wnl             --Continue to monitor for fevers or other signs of infection. 16. Poor appetite- eating 50-100% currently 17.  Constipation vs poor appetite- LBM 6 days ago- suggest KUB.    Moved bowels yesterday  18.  Somnolence ,improved  pt with poor sleep  ambien    -seems improved   LOS: 16 days A FACE TO FACE EVALUATION WAS PERFORMED  Charlett Blake 06/24/2021, 6:47 AM

## 2021-06-24 NOTE — Progress Notes (Signed)
Occupational Therapy Session Note  Patient Details  Name: Michaela Rios MRN: [id number] Date of Birth: [birth year]  Today's Date: 06/24/2021 OT Group Time: 1100-1200 OT Group Time Calculation (min): 60 min   Short Term Goals: Week 2:  OT Short Term Goal 1 (Week 2): Pt will don pants with min A. OT Short Term Goal 2 (Week 2): Pt will complete 2/3 steps of toileting with CGA. OT Short Term Goal 3 (Week 2): Pt will complete ambulatory toilet transfer min A + LRAD.  Skilled Therapeutic Interventions/Progress Updates:  Pt was seen for skilled group session with focus of group session on kitchen safety/ home management. Discussed energy conservation strategies, fall prevention and kitchen safety for home during session. Pt reports she previously completed iADL task of making jello in previous OT session and feels prepared to complete simple meal prep tasks in kitchen. Provided handout and education on energy conservation strategies for home. 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 Discussed fall prevention strategies with pt reporting she is aware that she will need to remove some rugs at home, pt reports she does not have pets or small children that may potentially create a fall hazard. Education and demonstration provided on how to get up if a fall were to occur, pt reports she is familiar with this strategy and reports understanding of importance of being ready if a fall were to occur. Education provided on community reintegration techniques with pt receptive to education and asking appropriate questions. Pt transported back to room with total A where pt completed stand pivot transfer back to bed with CGA, pt left supine in bed with bed alarm activated and all needs within reach.    Therapy Documentation Precautions:  Precautions Precautions: Fall Precaution Comments: mild L hemiparesis Restrictions Weight Bearing Restrictions: No  Pain: Pt  does not report any pain during session.    Therapy/Group: Group Therapy  Precious Haws 06/24/2021, 3:44 PM

## 2021-06-24 NOTE — Progress Notes (Signed)
Physical Therapy Weekly Progress Note  Patient Details  Name: Michaela Rios MRN: [id number] Date of Birth: 1951/06/11  Beginning of progress report period: June 16, 2021 End of progress report period: June 24, 2021  Today's Date: 06/24/2021 PT Individual Time: 1300-1335 PT Individual Time Calculation (min): 35 min   Patient has met 4 of 4 short term goals.  Pt continues to make slow, but consistent progress in balance and coordination. Along with continued progress in overall strength and awareness, her level of required physical assist has improved significantly since [facility].   Patient continues to demonstrate the following deficits: muscle weakness, decreased cardiorespiratoy endurance, impaired timing and sequencing, unbalanced muscle activation, and decreased coordination, decreased motor planning, decreased attention, decreased safety awareness, decreased memory, and delayed processing, and decreased standing balance, decreased postural control, decreased balance strategies, and hemipareisis  and therefore will continue to benefit from skilled PT intervention to increase functional independence with mobility and decrease burden on caregiver.  Patient progressing toward long term goals..  Continue plan of care.  PT Short Term Goals Week 2:  PT Short Term Goal 1 (Week 2): Pt will complete bed<>chair transfer with consistent CGA and no AD. PT Short Term Goal 1 - Progress (Week 2): Met PT Short Term Goal 2 (Week 2): Pt will ambulate at least 117f CGA to progress toward ambulation LTG PT Short Term Goal 2 - Progress (Week 2): Met PT Short Term Goal 3 (Week 2): Pt will complete at least 4 stairs MinA/ CGA. PT Short Term Goal 3 - Progress (Week 2): Met PT Short Term Goal 4 (Week 2): Pt will consistently complete sit to stands with supervision. PT Short Term Goal 4 - Progress (Week 2): Met Week 3:  PT Short Term Goal 1 (Week 3): STG = LTG d/t remaining LOS  Skilled Therapeutic  Interventions/Progress Updates:  Patient supine in bed on entrance to room. Patient alert and agreeable to PT session. Patient denied pain during session.  Therapeutic Activity: Bed Mobility: Patient performed supine <> sit with supervision. VC/ tc required for body alignment on return to supine. Transfers: Patient performed STS and SPVT transfers throughout session with focus on performance with conscious control of balance and smooth, controlled  movements with close supervision. Final short distance ambulatory pivot transfer from w/c to bed with close supervision and very good technique/ coordination. Provided vc during session for completing pivot turn and reaching back for seat.  Neuromuscular Re-ed: NMR facilitated during session with focus on standing balance/ coordination. Pt guided in static balance training on small red foam wedge with pt requiring HHA to step onto and extended time to attain balance. Significant vc/ tc with overall Min A for bringing weight forward "over toes" ito maintain balance. Upon stepping back off wedge and leaning forward to sit, pt freezes and is briefly unable to shift weight backwards to sit. Feels frustrated and stating that maybe she is not ready to go home yet. Moved to [employer] and performance of target touching exercise with pt demonstrating improved awareness of direction and requiring vc/ tc for increasing amplitude of weight shift in order to complete. Completes moderate target in 1576m 45 sec with 45% accuracy and large size target in 76m23m17sec and 23% accuracy. NMR performed for improvements in motor control and coordination, balance, sequencing, judgement, and self confidence/ efficacy in performing all aspects of mobility at highest level of independence.   Patient supine  in bed at end of session with brakes locked, bed alarm set,  and all needs within reach.     Therapy Documentation Precautions:  Precautions Precautions: Fall Precaution Comments:  mild L hemiparesis Restrictions Weight Bearing Restrictions: No  Therapy/Group: Individual Therapy  Alger Simons 06/24/2021, 9:05 AM

## 2021-06-24 NOTE — Progress Notes (Signed)
Occupational Therapy Session Note  Patient Details  Name: Michaela Rios MRN: [id number] Date of Birth: 1951/05/22  Today's Date: 06/24/2021 OT Individual Time: 0725-0807 OT Individual Time Calculation (min): 42 min    Short Term Goals: Week 3:  OT Short Term Goal 1 (Week 3): STG = LTG 2/2 ELOS  Skilled Therapeutic Interventions/Progress Updates:    Session 1 (0700-0709): Pt received semi-reclined in bed, agreeable to therapy. Session focus on self-care retraining, activity tolerance, func transfers in prep for improved ADL/IADL/func mobility performance + decreased caregiver burden. Declines ADL 2/2 having changed clothes earlier this am. First requesting more time to finish breakfast.   Therapist returned at [place], pt agreeable to therapy and requesting to wash hair. Came to sitting EOB with distant S and min Vcs for initiation. STS and amb to w/c at sink with min HHA, mild posterior LOB req min A to guide to seat. Washed hair with hair washing tray and pt able to appropriately hold tray in place and complete hair care with set-up. Additionally changed shirt and completed oral care at sink with set-up A.  Doffed/donned pants with min A for balance this date 2/2 posterior lean. Reviewed CVA prevention education including modifiable life style factors and BE FAST. Provided written copy. Pt reports having no previous knowledge of CVA prevention, and didn't know she was having a stroke PTA.   Pt left seated in w/c with safety belt alarm engaged, call bell in reach, and all immediate needs met.    Therapy Documentation Precautions:  Precautions Precautions: Fall Precaution Comments: mild L hemiparesis Restrictions Weight Bearing Restrictions: No  Pain: denies   ADL: See Care Tool for more details. Therapy/Group: Individual Therapy  Volanda Napoleon MS, OTR/L  06/24/2021, 6:36 AM

## 2021-06-24 NOTE — Progress Notes (Signed)
Speech Language Pathology Daily Session Note  Patient Details  Name: Michaela Rios MRN: [id number] Date of Birth: 10/11/1951  Today's Date: 06/24/2021 SLP Individual Time: 1430-1500 SLP Individual Time Calculation (min): 30 min  Short Term Goals: Week 3: SLP Short Term Goal 1 (Week 3): STG=LTG d/t ELOS  Skilled Therapeutic Interventions: Patient agreeable to skilled ST intervention with focus on cognitive goals. SLP facilitated selective attention and mildly complex verbal reasoning task using novel "Scattergories" game with min A semantic cues for attention and reasoning skills. Patient exhibited decreased cognitive endurance and internal distractions this date as she reported feeling tired from prior therapy. She benefited from prolonged processing time and semantic cues throughout activity. Patient was left in bed with alarm activated and immediate needs within reach at end of session. Continue per current plan of care.      Pain Pain Assessment Pain Scale: 0-10 Pain Score: 0-No pain  Therapy/Group: Individual Therapy  Annaka Cleaver T Gordie Belvin 06/24/2021, 3:00 PM

## 2021-06-25 LAB — BASIC METABOLIC PANEL
Anion gap: 8 (ref 5–15)
BUN: 14 mg/dL (ref 8–23)
CO2: 20 mmol/L — ABNORMAL LOW (ref 22–32)
Calcium: 8.7 mg/dL — ABNORMAL LOW (ref 8.9–10.3)
Chloride: 110 mmol/L (ref 98–111)
Creatinine, Ser: 0.85 mg/dL (ref 0.44–1.00)
GFR, Estimated: >60 mL/min (ref >60)
Glucose, Bld: 92 mg/dL (ref 70–99)
Potassium: 3.9 mmol/L (ref 3.5–5.1)
Sodium: 138 mmol/L (ref 135–145)

## 2021-06-25 NOTE — Progress Notes (Signed)
Occupational Therapy Session Note  Patient Details  Name: Michaela Rios MRN: [id number] Date of Birth: May 15, 1951  Today's Date: 06/25/2021 OT Individual Time: 1052-1202 OT Individual Time Calculation (min): 70 min    Short Term Goals: Week 3:  OT Short Term Goal 1 (Week 3): STG = LTG 2/2 ELOS  Skilled Therapeutic Interventions/Progress Updates:    Pt received semi-reclined in bed, denies pain, agreeable to therapy. Session focus on self-care retraining, activity tolerance, fall recovery, dynamic standing balance in prep for improved ADL/IADL/func mobility performance + decreased caregiver burden. Came to sitting EOB distant S and donned B shoes. Short amb transfer no AD with CGA > w/c. Total A w/c transport to and from gym 2/2 time management and energy conservation. Amb transfer > mat with min to mod A 2/2 R LOB but able to assist in self-recovery. Donned maxi sky sling and completed 3 passes of obstacle course involving weaving in/out cones, stepping up 6 inch step, while carrying objects. Req mod A for step with hand-held assist and cues for sequencing. Completed 2x10 on rocker board with and without BUE support, eyes open/closed. Req min A to facilitate WS on BLE. Endorses dizziness post activity but resolves with seated rest break. Assessed B grip strength + administered 9HPT with the following results:  R: 20, 24, 18; average of 21 lbs; 56 secs L: 19, 19, 18, average of 19 lbs; 59 secs   Finally, reviewed falls recovery including keeping cell phone on person at all times and assessing for wounds. Req mod A to recover from floor mat and mod Vcs for sequencing.   Pt left seated in recliner with safety belt alarm engaged, call bell in reach, and all immediate needs met.    Therapy Documentation Precautions:  Precautions Precautions: Fall Precaution Comments: mild L hemiparesis Restrictions Weight Bearing Restrictions: No  Pain: denies   ADL: See Care Tool for more  details.   Therapy/Group: Individual Therapy  Volanda Napoleon MS, OTR/L  06/25/2021, 6:39 AM

## 2021-06-25 NOTE — Progress Notes (Signed)
Speech Language Pathology Daily Session Note  Patient Details  Name: WINNELL CALDERARO MRN: [id number] Date of Birth: 12-01-1950  Today's Date: 06/25/2021 SLP Individual Time: 1433-1530 SLP Individual Time Calculation (min): 57 min  Short Term Goals: Week 3: SLP Short Term Goal 1 (Week 3): STG=LTG d/t ELOS  Skilled Therapeutic Interventions: Patient agreeable to skilled ST intervention with focus on cognitive goals. SLP re-administered [facility] Mental Status (SLUMS): 23/30 (score of <27 considered impairment based on patient's educational history) as compared to initial score of 17/30 on 06/09/21. Improvements noted in mental manipulation, short-term recall, attention, and problem solving. Although clock drawing was still not accurate, she exhibited improvement with attention to left visual field, self monitoring, and attempts to repair. Patient exhibited improved selective attention during today's session with minimal redirection in mildly distracting environment. Reviewed beneficial memory strategies in which patient was able to verbalize understanding with teach back at end of session. Patient was left in bed with alarm activated and immediate needs within reach at end of session. Continue per current plan of care.      Pain Pain Assessment Pain Scale: 0-10 Pain Score: 0-No pain Pain Type: Acute pain Pain Location: Hip Pain Orientation: Left Pain Descriptors / Indicators: Sore (muscle soreness)  Therapy/Group: Individual Therapy  Patty Sermons 06/25/2021, 3:50 PM

## 2021-06-25 NOTE — Progress Notes (Signed)
Patient w/o noted distress or complaints, denies pain or episodes of headaches,states she is eating somewhat better today,she is looking forward in going home soon( tentative plans 07/01/21) .Patient states she became frustrated today in therapy when she could not recall simple things asked of her,but she realize with time she will get better,Provided support and encouragement..  2211-Request for Ativan due to anxiety, resting   2300-0600 Monitor throughout shift appears to be asleep, respiration even and unlabored on rounding, call bell and bed alarm in place

## 2021-06-25 NOTE — Progress Notes (Signed)
Physical Therapy Session Note  Patient Details  Name: Michaela Rios MRN: [id number] Date of Birth: 1950/12/25  Today's Date: 06/25/2021 PT Individual Time: 1300-1416 PT Individual Time Calculation (min): 76 min   Short Term Goals: Week 3:  PT Short Term Goal 1 (Week 3): STG = LTG d/t remaining LOS  Skilled Therapeutic Interventions/Progress Updates:    Pt received sitting in wc and agreeable to therapy. Pt request to use the restroom prior to heading to the gym. Gait belt placed prior to initiation of mobility. Pt ambulated with CGA to toliet, continent of bladder, and pt completed peri care with CGA for balance.   Gait training 2x ~155f <> day room with CGA and verbal cuing for increased gait speed and step length. Noted foot clearance improved with increased step length. 3 x 176fand 1x~8049fith SPT anterior facilitation for increased gait speed, anterior weight shifting, and step length.   Ladder drills two feet in fwd, one foot in fwd, and lateral stepping 2x each CGA. Lateral stepping with BUE support on therapists forearms and 3 instances LOB with delayed stepping strategy min-modA for steadying. Pt demonstrated decreased lateral step length L>R that improved with verbal cuing and minor manual facilitation of weight shifting.   Forwards mini-lunge with reaching towards number targets placed hip to over head height and various angles, alternating LE step and UE reach 3x3' CGA-intermittent MinA for steadying. Lateral mini-lunge L/R with cone targets completed 2x15 with CGA. Pt demonstrated difficulty with LLE leading lunge, taking two steps to return to starting position. This improved with verbal cuing/min manual facilitation of weight shift to the R. Several instances LOB with delayed stepping strategy min-modA for steadying. Pt states she does not feel as confident when stepping with LLE. Pt education on how LOB in a controlled environment is a normal part of improving strategies to  maintain balance.   Pt transitioned sitting EOB> supine with supervision. HOB elevated, bed alarm on, and call bell within reach at the end of session. No further needs expressed at this time.   Therapy Documentation Precautions:  Precautions Precautions: Fall Precaution Comments: mild L hemiparesis Restrictions Weight Bearing Restrictions: No  Pain: Pain Assessment Pain Scale: 0-10 Pain Score: 2  Pain Type: Acute pain Pain Location: Hip Pain Orientation: Left Pain Descriptors / Indicators: Sore (muscle soreness)    Therapy/Group: Individual Therapy  Dominie Benedick, SPT  06/25/2021, 3:29 PM

## 2021-06-25 NOTE — Progress Notes (Addendum)
PROGRESS NOTE   Subjective/Complaints:  Slept well did not need med for HA yesterday , did not get her breakfast order  ROS: Patient denies CP, SOB, N/V/D   Objective:   No results found. Recent Labs    06/23/21 0502  WBC 3.8*  HGB 10.4*  HCT 32.3*  PLT 345     Recent Labs    06/23/21 0502 06/25/21 0454  NA 138 138  K 3.3* 3.9  CL 109 110  CO2 20* 20*  GLUCOSE 96 92  BUN 8 14  CREATININE 0.80 0.85  CALCIUM 8.8* 8.7*      Intake/Output Summary (Last 24 hours) at 06/25/2021 0818 Last data filed at 06/24/2021 1750 Gross per 24 hour  Intake 480 ml  Output --  Net 480 ml         Physical Exam: Vital Signs Blood pressure 135/89, pulse 91, temperature 98.5 F (36.9 C), temperature source Oral, resp. rate 20, height '5\' 3"'$  (1.6 m), weight 56.1 kg, SpO2 99 %.   General: No acute distress Mood and affect are appropriate Heart: Regular rate and rhythm no rubs murmurs or extra sounds Lungs: Clear to auscultation, breathing unlabored, no rales or wheezes Abdomen: Positive bowel sounds, soft nontender to palpation, nondistended Extremities: No clubbing, cyanosis, or edema, no masses or lesions RIght tricep area Skin: No evidence of breakdown, no evidence of rash   Skin: ecchymosis and swelling but no open areas dorsum of RIght hand Neurologic: Cranial nerves II through XII intact, motor strength is 4-5/5 in bilateral deltoid, bicep, tricep, grip, hip flexor, knee extensors, ankle dorsiflexor and plantar flexor. No sensory changes  Musculoskeletal: Full ROM, No pain with AROM or PROM in the neck, trunk, or extremities. Posture appropriate    Assessment/Plan: 1. Functional deficits which require 3+ hours per day of interdisciplinary therapy in a comprehensive inpatient rehab setting. Physiatrist is providing close team supervision and 24 hour management of active medical problems listed below. Physiatrist  and rehab team continue to assess barriers to discharge/monitor patient progress toward functional and medical goals  Care Tool:  Bathing    Body parts bathed by patient: Right arm, Left arm, Chest, Face, Front perineal area, Abdomen, Right upper leg, Left upper leg, Buttocks   Body parts bathed by helper: Buttocks     Bathing assist Assist Level: Contact Guard/Touching assist     Upper Body Dressing/Undressing Upper body dressing   What is the patient wearing?: Pull over shirt    Upper body assist Assist Level: Set up assist    Lower Body Dressing/Undressing Lower body dressing      What is the patient wearing?: Pants, Underwear/pull up     Lower body assist Assist for lower body dressing: Contact Guard/Touching assist     Toileting Toileting    Toileting assist Assist for toileting: Contact Guard/Touching assist     Transfers Chair/bed transfer  Transfers assist     Chair/bed transfer assist level: Contact Guard/Touching assist     Locomotion Ambulation   Ambulation assist      Assist level: Contact Guard/Touching assist Assistive device: No Device Max distance: 140f   Walk 10 feet activity   Assist  Assist level: Contact Guard/Touching assist Assistive device: No Device   Walk 50 feet activity   Assist Walk 50 feet with 2 turns activity did not occur: Safety/medical concerns  Assist level: Contact Guard/Touching assist Assistive device: No Device    Walk 150 feet activity   Assist Walk 150 feet activity did not occur: Safety/medical concerns  Assist level: Minimal Assistance - Patient > 75% Assistive device: No Device    Walk 10 feet on uneven surface  activity   Assist Walk 10 feet on uneven surfaces activity did not occur: Safety/medical concerns         Wheelchair     Assist Will patient use wheelchair at discharge?: No   Wheelchair activity did not occur: N/A         Wheelchair 50 feet with 2 turns  activity    Assist    Wheelchair 50 feet with 2 turns activity did not occur: N/A       Wheelchair 150 feet activity     Assist  Wheelchair 150 feet activity did not occur: N/A       Blood pressure 135/89, pulse 91, temperature 98.5 F (36.9 C), temperature source Oral, resp. rate 20, height '5\' 3"'$  (1.6 m), weight 56.1 kg, SpO2 99 %.   Medical Problem List and Plan: 1.  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  Antithrombotics: -DVT/anticoagulation:  Pharmaceutical: Lovenox             -antiplatelet therapy: DAPT X 3 weeks followed by ASA alone.  3. Pain Management: Tylenol prn. Has migraines- cannot take Imitrex- generally controlled but had one ~6p yesterday that lasted >1h- monitor on current meds   Some breakthrough HA topamax '100mg'$  BID, will try fioricet in place of hydrocodone 4. Mood: LCSW to follow for evaluation and support.              -antipsychotic agents: N/A 5. Neuropsych: This patient is not  capable of making decisions on his own behalf. 6. Skin/Wound Care: Routine pressure relief measures.  7. Fluids/Electrolytes/Nutrition: encourage PO, HypoK+ resolved with KCL 41mq daily  8. HTN: Monitor BP tid-- Vitals:   06/24/21 1941 06/25/21 0522  BP: 122/85 135/89  Pulse: 90 91  Resp: 18 20  Temp: 98.9 F (37.2 C) 98.5 F (36.9 C)  SpO2: 97% 99%   7/29 controlled  9. CKD III: Has been incontinent since admission. Will monitor voiding with PVR checkss.             --set toileting schedule.  7/17- voiding some on toilet- incontinence gradually improving- con't voiding/toileting schedule 10. H/o depression/anxiety: Managed with Effexor, Wellbutrin and lorazepam prn. 11. H/o Migraines: controlled on Topamax.  Has had 4x since admission- may use Nurtec prn since this is not a  vasoconstrictor; increased Topamax to '100mg'$  BID.    12. RML nodule: Recommend repeat CT in 12 months. 13. Hyponatremia: Resolved  14. Acute on chronic anemia of chronic disease:  Hgb 11.6 at admission-->9.9 >10.6>9.7>10.4 15. FUO: WBC wnl             --Continue to monitor for fevers or other signs of infection. 16. Poor appetite- eating 50-100% currently 17. Constipation  vs poor appetite- LBM 6 days ago- suggest KUB.    Moved bowels yesterday  18.  Somnolence ,improved  pt with poor sleep  ambien    -seems improved   LOS: 17 days A FACE TO [place] E Tanner Yeley 06/25/2021, 8:18 AM

## 2021-06-26 NOTE — Plan of Care (Signed)
  Problem: Consults Goal: RH STROKE PATIENT EDUCATION Description: See Patient Education module for education specifics  Outcome: Progressing   Problem: RH BOWEL ELIMINATION Goal: RH STG MANAGE BOWEL WITH ASSISTANCE Description: STG Manage Bowel with min Assistance. Outcome: Progressing Goal: RH STG MANAGE BOWEL W/MEDICATION W/ASSISTANCE Description: STG Manage Bowel with Medication with Assistance. Outcome: Progressing   Problem: RH BLADDER ELIMINATION Goal: RH STG MANAGE BLADDER WITH ASSISTANCE Description: STG Manage Bladder With min  Assistance Outcome: Progressing   Problem: RH SAFETY Goal: RH STG ADHERE TO SAFETY PRECAUTIONS W/ASSISTANCE/DEVICE Description: STG Adhere to Safety Precautions With Assistance/Device. Outcome: Progressing   Problem: RH COGNITION-NURSING Goal: RH STG USES MEMORY AIDS/STRATEGIES W/ASSIST TO PROBLEM SOLVE Description: STG Uses Memory Aids/Strategies With cues/reminders Assistance to Problem Solve. Outcome: Progressing   Problem: RH PAIN MANAGEMENT Goal: RH STG PAIN MANAGED AT OR BELOW PT'S PAIN GOAL Description: At or below level 4 Outcome: Progressing   Problem: RH KNOWLEDGE DEFICIT Goal: RH STG INCREASE KNOWLEDGE OF DIABETES Description: Patient will be able to manage prediabetes with dietary modifications using handouts and educational tools with cues/reminders Outcome: Progressing Goal: RH STG INCREASE KNOWLEDGE OF HYPERTENSION Description: Patient will be able to manage HTN with medications and dietary modifications using handouts and educational tools with cues/reminders Outcome: Progressing Goal: RH STG INCREASE KNOWLEDGE OF STROKE PROPHYLAXIS Description: Patient will be able to manage secondary stroke risks with meds and dietary modifications using handouts and educational tools with cues/reminders Outcome: Progressing

## 2021-06-26 NOTE — Progress Notes (Signed)
Physical Therapy Session Note  Patient Details  Name: Michaela Rios MRN: [id number] Date of Birth: [birth year]  Today's Date: 06/26/2021 PT Individual Time: 0803-0914 PT Individual Time Calculation (min): 71 min   Short Term Goals: Week 3:  PT Short Term Goal 1 (Week 3): STG = LTG d/t remaining LOS  Skilled Therapeutic Interventions/Progress Updates:  Patient supine in bed on entrance to room. Patient alert and agreeable to PT session. Patient denied pain during session, but takes time to initiate  Therapeutic Activity: Bed Mobility: Patient performed supine <> sit with supervision. VC/ tc required for initiation/ attn to task. Transfers: Patient performed STS and SPVT transfers throughout session with supervision/ CGA. Provided vc/ tc for weight shift and hands to seat prior to descent to sit.  Gait Training:  Patient ambulated >200 feet x2 using no AD with CGA. Intermittent balance corrections made with posterior balance shifts with pauses in gait. Attempt to introduce SBQC to pt for increased stability and slight forward weight shift. Pt becomes extremely focused on sequencing of AD and quality of gait significantly reduces. Immediate improvement in quality of gait with return to amb with no AD. Provided vc/ tc for arm swing, forward gaze, slight increase in forward lean for improved momentum and balance maintenance.  Neuromuscular Re-ed: NMR facilitated during session with focus on standing balance. Pt guided in start drill with toe taps to cones. Taps with r foot performed with good coordination and balance. Taps with L foot cause significant pause and reduction in balance.She requires min A and increased vc for sequencing balance and weight shift to complete. Pt able to withstand up to max perturbations and demonstrate correct stepping strategy in order to maintain balance. However, pt unable to attain and maintain balance stepping onto low and medium height foam wedge as pt unable to  overcome posterior LOB despite up to Mod A to attain. Pt ambulated up/ down ramped surface with no problems with balance/ coordination and no LOB. NMR performed for improvements in motor control and coordination, balance, sequencing, judgement, and self confidence/ efficacy in performing all aspects of mobility at highest level of independence.   Patient seated upright in w/c at end of session with brakes locked, belt alarm set, and all needs within reach. Tray table to front of pt and call bell to pt's R.      Therapy Documentation Precautions:  Precautions Precautions: Fall Precaution Comments: mild L hemiparesis Restrictions Weight Bearing Restrictions: No  Therapy/Group: Individual Therapy  Alger Simons PT, DPT 06/26/2021, 3:52 PM

## 2021-06-26 NOTE — Progress Notes (Signed)
PROGRESS NOTE   Subjective/Complaints: Feeling well, has no complaints Patient's chart reviewed- No issues reported overnight Diastolic BP elevated  ROS: Patient denies CP, SOB, N/V/D   Objective:   No results found. No results for input(s): WBC, HGB, HCT, PLT in the last 72 hours.  Recent Labs    06/25/21 0454  NA 138  K 3.9  CL 110  CO2 20*  GLUCOSE 92  BUN 14  CREATININE 0.85  CALCIUM 8.7*     Intake/Output Summary (Last 24 hours) at 06/26/2021 1105 Last data filed at 06/26/2021 0849 Gross per 24 hour  Intake 236 ml  Output --  Net 236 ml        Physical Exam: Vital Signs Blood pressure (!) 134/93, pulse 89, temperature 98.3 F (36.8 C), resp. rate 18, height '5\' 3"'$  (1.6 m), weight 56.1 kg, SpO2 96 %. Gen: no distress, normal appearing HEENT: oral mucosa pink and moist, NCAT Cardio: Reg rate Chest: normal effort, normal rate of breathing Abd: soft, non-distended Ext: no edema Psych: pleasant, normal affect   Skin: ecchymosis and swelling but no open areas dorsum of RIght hand Neurologic: Cranial nerves II through XII intact, motor strength is 4-5/5 in bilateral deltoid, bicep, tricep, grip, hip flexor, knee extensors, ankle dorsiflexor and plantar flexor. No sensory changes  Musculoskeletal: Full ROM, No pain with AROM or PROM in the neck, trunk, or extremities. Posture appropriate    Assessment/Plan: 1. Functional deficits which require 3+ hours per day of interdisciplinary therapy in a comprehensive inpatient rehab setting. Physiatrist is providing close team supervision and 24 hour management of active medical problems listed below. Physiatrist and rehab team continue to assess barriers to discharge/monitor patient progress toward functional and medical goals  Care Tool:  Bathing    Body parts bathed by patient: Right arm, Left arm, Chest, Face, Front perineal area, Abdomen, Right upper  leg, Left upper leg, Buttocks   Body parts bathed by helper: Buttocks     Bathing assist Assist Level: Contact Guard/Touching assist     Upper Body Dressing/Undressing Upper body dressing   What is the patient wearing?: Pull over shirt    Upper body assist Assist Level: Set up assist    Lower Body Dressing/Undressing Lower body dressing      What is the patient wearing?: Pants, Underwear/pull up     Lower body assist Assist for lower body dressing: Contact Guard/Touching assist     Toileting Toileting    Toileting assist Assist for toileting: Contact Guard/Touching assist     Transfers Chair/bed transfer  Transfers assist     Chair/bed transfer assist level: Contact Guard/Touching assist     Locomotion Ambulation   Ambulation assist      Assist level: Contact Guard/Touching assist Assistive device: No Device Max distance: 158f   Walk 10 feet activity   Assist     Assist level: Contact Guard/Touching assist Assistive device: No Device   Walk 50 feet activity   Assist Walk 50 feet with 2 turns activity did not occur: Safety/medical concerns  Assist level: Contact Guard/Touching assist Assistive device: No Device    Walk 150 feet activity   Assist Walk 150  feet activity did not occur: Safety/medical concerns  Assist level: Minimal Assistance - Patient > 75% Assistive device: No Device    Walk 10 feet on uneven surface  activity   Assist Walk 10 feet on uneven surfaces activity did not occur: Safety/medical concerns         Wheelchair     Assist Will patient use wheelchair at discharge?: No   Wheelchair activity did not occur: N/A         Wheelchair 50 feet with 2 turns activity    Assist    Wheelchair 50 feet with 2 turns activity did not occur: N/A       Wheelchair 150 feet activity     Assist  Wheelchair 150 feet activity did not occur: N/A       Blood pressure (!) 134/93, pulse 89, temperature  98.3 F (36.8 C), resp. rate 18, height '5\' 3"'$  (1.6 m), weight 56.1 kg, SpO2 96 %.   Medical Problem List and Plan: 1.  B/L weakness secondary to R BG and B/L thalamic infarcts             -patient may  shower             -ELOS/Goals: 07/01/21 supervision to min A,   -Continue CIR therapies including PT, OT, and SLP 2.  Antithrombotics: -DVT/anticoagulation:  Pharmaceutical: Lovenox             -antiplatelet therapy: DAPT X 3 weeks followed by ASA alone.  3. Pain Management: Tylenol prn. Has migraines- cannot take Imitrex- generally controlled but had one ~6p yesterday that lasted >1h- monitor on current meds   Some breakthrough HA topamax '100mg'$  BID, will try fioricet in place of hydrocodone  7/30: improved, continue above regimen 4. Mood: LCSW to follow for evaluation and support.              -antipsychotic agents: N/A 5. Neuropsych: This patient is not  capable of making decisions on his own behalf. 6. Skin/Wound Care: Routine pressure relief measures.  7. Fluids/Electrolytes/Nutrition: encourage PO, HypoK+ resolved with KCL 73mq daily  8. HTN: Monitor BP tid-- Vitals:   06/25/21 1931 06/26/21 0606  BP: 131/88 (!) 134/93  Pulse: 95 89  Resp: 17 18  Temp: 98.5 F (36.9 C) 98.3 F (36.8 C)  SpO2: 93% 90000000  [id number] diastolic BP elevated. Flowsheet reviewed and has mostly been stable- continue to monitor. 9. CKD III: Has been incontinent since admission. Will monitor voiding with PVR checkss.             --set toileting schedule.  7/17- voiding some on toilet- incontinence gradually improving- con't voiding/toileting schedule 10. H/o depression/anxiety: Managed with Effexor, Wellbutrin and lorazepam prn. 11. H/o Migraines: continueTopamax.  Has had 4x since admission- may use Nurtec prn since this is not a vasoconstrictor; increased Topamax to '100mg'$  BID.  12. RML nodule: Recommend repeat CT in 12 months. 13. Hyponatremia: Resolved  14. Acute on chronic anemia of chronic disease:   Hgb 11.6 at admission-->9.9 >10.6>9.7>10.4 15. FUO: WBC wnl             --Continue to monitor for fevers or other signs of infection. 16. Poor appetite- eating 50-100% currently 17. Constipation vs poor appetite- LBM 6 days ago- suggest KUB.    Moved bowels yesterday  18.  Somnolence ,improved  pt with poor sleep  ambien    -seems improved   LOS: 18 days A FACE TO FACE EVALUATION WAS PERFORMED  Michaela Rios 06/26/2021, 11:05 AM

## 2021-06-27 NOTE — Progress Notes (Signed)
PROGRESS NOTE   Subjective/Complaints: No complaints  ROS: Patient denies CP, SOB, N/V/D   Objective:   No results found. No results for input(s): WBC, HGB, HCT, PLT in the last 72 hours.  Recent Labs    06/25/21 0454  NA 138  K 3.9  CL 110  CO2 20*  GLUCOSE 92  BUN 14  CREATININE 0.85  CALCIUM 8.7*     Intake/Output Summary (Last 24 hours) at 06/27/2021 1227 Last data filed at 06/27/2021 0932 Gross per 24 hour  Intake 717 ml  Output --  Net 717 ml        Physical Exam: Vital Signs Blood pressure 128/89, pulse 89, temperature 98.8 F (37.1 C), temperature source Oral, resp. rate 18, height '5\' 3"'$  (1.6 m), weight 56.1 kg, SpO2 99 %. Gen: no distress, normal appearing HEENT: oral mucosa pink and moist, NCAT Cardio: Reg rate Chest: normal effort, normal rate of breathing Abd: soft, non-distended Ext: no edema Psych: pleasant, normal affect   Skin: ecchymosis and swelling but no open areas dorsum of RIght hand Neurologic: Cranial nerves II through XII intact, motor strength is 4-5/5 in bilateral deltoid, bicep, tricep, grip, hip flexor, knee extensors, ankle dorsiflexor and plantar flexor. No sensory changes  Musculoskeletal: Full ROM, No pain with AROM or PROM in the neck, trunk, or extremities. Posture appropriate    Assessment/Plan: 1. Functional deficits which require 3+ hours per day of interdisciplinary therapy in a comprehensive inpatient rehab setting. Physiatrist is providing close team supervision and 24 hour management of active medical problems listed below. Physiatrist and rehab team continue to assess barriers to discharge/monitor patient progress toward functional and medical goals  Care Tool:  Bathing    Body parts bathed by patient: Right arm, Left arm, Chest, Face, Front perineal area, Abdomen, Right upper leg, Left upper leg, Buttocks   Body parts bathed by helper: Buttocks      Bathing assist Assist Level: Contact Guard/Touching assist     Upper Body Dressing/Undressing Upper body dressing   What is the patient wearing?: Pull over shirt    Upper body assist Assist Level: Set up assist    Lower Body Dressing/Undressing Lower body dressing      What is the patient wearing?: Pants, Underwear/pull up     Lower body assist Assist for lower body dressing: Contact Guard/Touching assist     Toileting Toileting    Toileting assist Assist for toileting: Contact Guard/Touching assist     Transfers Chair/bed transfer  Transfers assist     Chair/bed transfer assist level: Contact Guard/Touching assist     Locomotion Ambulation   Ambulation assist      Assist level: Contact Guard/Touching assist Assistive device: No Device Max distance: 195f   Walk 10 feet activity   Assist     Assist level: Contact Guard/Touching assist Assistive device: No Device   Walk 50 feet activity   Assist Walk 50 feet with 2 turns activity did not occur: Safety/medical concerns  Assist level: Contact Guard/Touching assist Assistive device: No Device    Walk 150 feet activity   Assist Walk 150 feet activity did not occur: Safety/medical concerns  Assist level: Minimal  Assistance - Patient > 75% Assistive device: No Device    Walk 10 feet on uneven surface  activity   Assist Walk 10 feet on uneven surfaces activity did not occur: Safety/medical concerns         Wheelchair     Assist Will patient use wheelchair at discharge?: No   Wheelchair activity did not occur: N/A         Wheelchair 50 feet with 2 turns activity    Assist    Wheelchair 50 feet with 2 turns activity did not occur: N/A       Wheelchair 150 feet activity     Assist  Wheelchair 150 feet activity did not occur: N/A       Blood pressure 128/89, pulse 89, temperature 98.8 F (37.1 C), temperature source Oral, resp. rate 18, height '5\' 3"'$  (1.6 m),  weight 56.1 kg, SpO2 99 %.   Medical Problem List and Plan: 1.  B/L weakness secondary to R BG and B/L thalamic infarcts             -patient may  shower             -ELOS/Goals: 07/01/21 supervision to min A,   -Continue CIR therapies including PT, OT, and SLP 2.  Antithrombotics: -DVT/anticoagulation:  Pharmaceutical: Lovenox             -antiplatelet therapy: DAPT X 3 weeks followed by ASA alone.  3. Pain Management: Tylenol prn. Has migraines- cannot take Imitrex- generally controlled but had one ~6p yesterday that lasted >1h- monitor on current meds   Some breakthrough HA topamax '100mg'$  BID, will try fioricet in place of hydrocodone  7/30: improved, continue above regimen 4. Mood: LCSW to follow for evaluation and support.              -antipsychotic agents: N/A 5. Neuropsych: This patient is not  capable of making decisions on his own behalf. 6. Skin/Wound Care: Routine pressure relief measures.  7. Fluids/Electrolytes/Nutrition: encourage PO, HypoK+ resolved with KCL 73mq daily  8. HTN: Monitor BP tid-- Vitals:   06/26/21 1943 06/27/21 0437  BP: 118/81 128/89  Pulse: 88 89  Resp: 16 18  Temp: 98.9 F (37.2 C) 98.8 F (37.1 C)  SpO2: 96% [id number]  [id number] diastolic BP elevated. Flowsheet reviewed and has mostly been stable- continue to monitor. 9. CKD III: Has been incontinent since admission. Will monitor voiding with PVR checkss.             --set toileting schedule.  7/17- voiding some on toilet- incontinence gradually improving- con't voiding/toileting schedule 10. H/o depression/anxiety: Continue Effexor, Wellbutrin and lorazepam prn. 11. H/o Migraines: Continue Topamax.  Has had 4x since admission- may use Nurtec prn since this is not a vasoconstrictor; increased Topamax to '100mg'$  BID 12. RML nodule: Recommend repeat CT in 12 months. 13. Hyponatremia: Resolved  14. Acute on chronic anemia of chronic disease:  Hgb 11.6 at admission-->9.9 >10.6>9.7>10.4 15. FUO: WBC wnl              --Continue to monitor for fevers or other signs of infection. 16. Poor appetite- eating 50-100% currently 17. Constipation vs poor appetite- LBM 6 days ago- suggest KUB.    Moved bowels yesterday  18.  Somnolence ,improved  pt with poor sleep  ambien    -seems improved   LOS: 19 days A FACE TO FACE EVALUATION WAS PERFORMED  KClide DeutscherRaulkar 06/27/2021, 12:27 PM

## 2021-06-27 NOTE — Plan of Care (Signed)
  Problem: Consults Goal: RH STROKE PATIENT EDUCATION Description: See Patient Education module for education specifics  Outcome: Progressing   Problem: RH BOWEL ELIMINATION Goal: RH STG MANAGE BOWEL WITH ASSISTANCE Description: STG Manage Bowel with min Assistance. Outcome: Progressing Goal: RH STG MANAGE BOWEL W/MEDICATION W/ASSISTANCE Description: STG Manage Bowel with Medication with Assistance. Outcome: Progressing   Problem: RH BLADDER ELIMINATION Goal: RH STG MANAGE BLADDER WITH ASSISTANCE Description: STG Manage Bladder With min  Assistance Outcome: Progressing   Problem: RH SAFETY Goal: RH STG ADHERE TO SAFETY PRECAUTIONS W/ASSISTANCE/DEVICE Description: STG Adhere to Safety Precautions With Assistance/Device. Outcome: Progressing   Problem: RH COGNITION-NURSING Goal: RH STG USES MEMORY AIDS/STRATEGIES W/ASSIST TO PROBLEM SOLVE Description: STG Uses Memory Aids/Strategies With cues/reminders Assistance to Problem Solve. Outcome: Progressing   Problem: RH PAIN MANAGEMENT Goal: RH STG PAIN MANAGED AT OR BELOW PT'S PAIN GOAL Description: At or below level 4 Outcome: Progressing   Problem: RH KNOWLEDGE DEFICIT Goal: RH STG INCREASE KNOWLEDGE OF DIABETES Description: Patient will be able to manage prediabetes with dietary modifications using handouts and educational tools with cues/reminders Outcome: Progressing Goal: RH STG INCREASE KNOWLEDGE OF HYPERTENSION Description: Patient will be able to manage HTN with medications and dietary modifications using handouts and educational tools with cues/reminders Outcome: Progressing Goal: RH STG INCREASE KNOWLEDGE OF STROKE PROPHYLAXIS Description: Patient will be able to manage secondary stroke risks with meds and dietary modifications using handouts and educational tools with cues/reminders Outcome: Progressing

## 2021-06-28 LAB — MISC LABCORP TEST (SEND OUT): Labcorp test code: 9985

## 2021-06-28 NOTE — Plan of Care (Signed)
  Problem: RH Balance Goal: LTG Patient will maintain dynamic standing with ADLs (OT) Description: LTG:  Patient will maintain dynamic standing balance with assist during activities of daily living (OT)  Flowsheets (Taken 06/28/2021 1613) LTG: Pt will maintain dynamic standing balance during ADLs with: (Goal downgraded 2/2 slower than anticipated progress.) Contact Guard/Touching assist Note: Goal downgraded 2/2 slower than anticipated progress.   Problem: RH Toileting Goal: LTG Patient will perform toileting task (3/3 steps) with assistance level (OT) Description: LTG: Patient will perform toileting task (3/3 steps) with assistance level (OT)  Flowsheets (Taken 06/28/2021 1613) LTG: Pt will perform toileting task (3/3 steps) with assistance level: (Goal downgraded 2/2 slower than anticipated progress.) Contact Guard/Touching assist Note: Goal downgraded 2/2 slower than anticipated progress.   Problem: RH Toilet Transfers Goal: LTG Patient will perform toilet transfers w/assist (OT) Description: LTG: Patient will perform toilet transfers with assist, with/without cues using equipment (OT) Flowsheets (Taken 06/28/2021 1613) LTG: Pt will perform toilet transfers with assistance level of: (Goal downgraded 2/2 slower than anticipated progress.) Contact Guard/Touching assist Note: Goal downgraded 2/2 slower than anticipated progress.   Problem: RH Tub/Shower Transfers Goal: LTG Patient will perform tub/shower transfers w/assist (OT) Description: LTG: Patient will perform tub/shower transfers with assist, with/without cues using equipment (OT) Flowsheets (Taken 06/28/2021 1613) LTG: Pt will perform tub/shower stall transfers with assistance level of: (Goal downgraded 2/2 slower than anticipated progress.) Contact Guard/Touching assist Note: Goal downgraded 2/2 slower than anticipated progress.

## 2021-06-28 NOTE — Plan of Care (Signed)
  Problem: Consults Goal: RH STROKE PATIENT EDUCATION Description: See Patient Education module for education specifics  Outcome: Progressing   Problem: RH BOWEL ELIMINATION Goal: RH STG MANAGE BOWEL WITH ASSISTANCE Description: STG Manage Bowel with min Assistance. Outcome: Progressing Goal: RH STG MANAGE BOWEL W/MEDICATION W/ASSISTANCE Description: STG Manage Bowel with Medication with Assistance. Outcome: Progressing   Problem: RH BLADDER ELIMINATION Goal: RH STG MANAGE BLADDER WITH ASSISTANCE Description: STG Manage Bladder With min  Assistance Outcome: Progressing   Problem: RH SAFETY Goal: RH STG ADHERE TO SAFETY PRECAUTIONS W/ASSISTANCE/DEVICE Description: STG Adhere to Safety Precautions With Assistance/Device. Outcome: Progressing   Problem: RH COGNITION-NURSING Goal: RH STG USES MEMORY AIDS/STRATEGIES W/ASSIST TO PROBLEM SOLVE Description: STG Uses Memory Aids/Strategies With cues/reminders Assistance to Problem Solve. Outcome: Progressing   Problem: RH PAIN MANAGEMENT Goal: RH STG PAIN MANAGED AT OR BELOW PT'S PAIN GOAL Description: At or below level 4 Outcome: Progressing   Problem: RH KNOWLEDGE DEFICIT Goal: RH STG INCREASE KNOWLEDGE OF DIABETES Description: Patient will be able to manage prediabetes with dietary modifications using handouts and educational tools with cues/reminders Outcome: Progressing Goal: RH STG INCREASE KNOWLEDGE OF HYPERTENSION Description: Patient will be able to manage HTN with medications and dietary modifications using handouts and educational tools with cues/reminders Outcome: Progressing Goal: RH STG INCREASE KNOWLEDGE OF STROKE PROPHYLAXIS Description: Patient will be able to manage secondary stroke risks with meds and dietary modifications using handouts and educational tools with cues/reminders Outcome: Progressing

## 2021-06-28 NOTE — Progress Notes (Signed)
PROGRESS NOTE   Subjective/Complaints:   No issues overnite , bowels doing ok, occ bladder incont   ROS: Patient denies CP, SOB, N/V/D   Objective:   No results found. No results for input(s): WBC, HGB, HCT, PLT in the last 72 hours.  No results for input(s): NA, K, CL, CO2, GLUCOSE, BUN, CREATININE, CALCIUM in the last 72 hours.    Intake/Output Summary (Last 24 hours) at 06/28/2021 0952 Last data filed at 06/28/2021 0730 Gross per 24 hour  Intake 480 ml  Output --  Net 480 ml         Physical Exam: Vital Signs Blood pressure 126/87, pulse 83, temperature 98.7 F (37.1 C), temperature source Oral, resp. rate 18, height '5\' 3"'$  (1.6 m), weight 56.1 kg, SpO2 96 %.  General: No acute distress Mood and affect are appropriate Heart: Regular rate and rhythm no rubs murmurs or extra sounds Lungs: Clear to auscultation, breathing unlabored, no rales or wheezes Abdomen: Positive bowel sounds, soft nontender to palpation, nondistended Extremities: No clubbing, cyanosis, or edema  Skin: ecchymosis and swelling but no open areas dorsum of RIght hand Neurologic: Cranial nerves II through XII intact, motor strength is 4-5/5 in bilateral deltoid, bicep, tricep, grip, hip flexor, knee extensors, ankle dorsiflexor and plantar flexor. No sensory changes  Musculoskeletal: Full ROM, No pain with AROM or PROM in the neck, trunk, or extremities. Posture appropriate    Assessment/Plan: 1. Functional deficits which require 3+ hours per day of interdisciplinary therapy in a comprehensive inpatient rehab setting. Physiatrist is providing close team supervision and 24 hour management of active medical problems listed below. Physiatrist and rehab team continue to assess barriers to discharge/monitor patient progress toward functional and medical goals  Care Tool:  Bathing    Body parts bathed by patient: Right arm, Left arm, Chest,  Face, Front perineal area, Abdomen, Right upper leg, Left upper leg, Buttocks   Body parts bathed by helper: Buttocks     Bathing assist Assist Level: Contact Guard/Touching assist     Upper Body Dressing/Undressing Upper body dressing   What is the patient wearing?: Pull over shirt    Upper body assist Assist Level: Set up assist    Lower Body Dressing/Undressing Lower body dressing      What is the patient wearing?: Pants, Underwear/pull up     Lower body assist Assist for lower body dressing: Contact Guard/Touching assist     Toileting Toileting    Toileting assist Assist for toileting: Contact Guard/Touching assist     Transfers Chair/bed transfer  Transfers assist     Chair/bed transfer assist level: Contact Guard/Touching assist     Locomotion Ambulation   Ambulation assist      Assist level: Contact Guard/Touching assist Assistive device: No Device Max distance: 114f   Walk 10 feet activity   Assist     Assist level: Contact Guard/Touching assist Assistive device: No Device   Walk 50 feet activity   Assist Walk 50 feet with 2 turns activity did not occur: Safety/medical concerns  Assist level: Contact Guard/Touching assist Assistive device: No Device    Walk 150 feet activity   Assist Walk 150 feet  activity did not occur: Safety/medical concerns  Assist level: Minimal Assistance - Patient > 75% Assistive device: No Device    Walk 10 feet on uneven surface  activity   Assist Walk 10 feet on uneven surfaces activity did not occur: Safety/medical concerns         Wheelchair     Assist Will patient use wheelchair at discharge?: No   Wheelchair activity did not occur: N/A         Wheelchair 50 feet with 2 turns activity    Assist    Wheelchair 50 feet with 2 turns activity did not occur: N/A       Wheelchair 150 feet activity     Assist  Wheelchair 150 feet activity did not occur: N/A        Blood pressure 126/87, pulse 83, temperature 98.7 F (37.1 C), temperature source Oral, resp. rate 18, height '5\' 3"'$  (1.6 m), weight 56.1 kg, SpO2 96 %.   Medical Problem List and Plan: 1.  B/L weakness secondary to R BG and B/L thalamic infarcts             -patient may  shower             -ELOS/Goals: 07/01/21 supervision to min A,   -Continue CIR therapies including PT, OT, and SLP 2.  Antithrombotics: -DVT/anticoagulation:  Pharmaceutical: Lovenox             -antiplatelet therapy: DAPT X 3 weeks followed by ASA alone.  3. Pain Management: Tylenol prn. Has migraines- cannot take Imitrex- generally controlled but had one ~6p yesterday that lasted >1h- monitor on current meds   Some breakthrough HA topamax '100mg'$  BID, will try fioricet in place of hydrocodone  7/30: improved, continue above regimen 4. Mood: LCSW to follow for evaluation and support.              -antipsychotic agents: N/A 5. Neuropsych: This patient is not  capable of making decisions on his own behalf. 6. Skin/Wound Care: Routine pressure relief measures.  7. Fluids/Electrolytes/Nutrition: encourage PO, HypoK+ resolved with KCL 63mq daily , recheck prior to D/C 8. HTN: Monitor BP tid-- Vitals:   06/28/21 0356 06/28/21 0545  BP: 116/81 126/87  Pulse: 83   Resp: 18   Temp: 98.7 F (37.1 C)   SpO2: 96%    8/1 well controlled  9. CKD III: Has been incontinent since admission. Will monitor voiding with PVR checkss.             --set toileting schedule.  7/17- voiding some on toilet- incontinence gradually improving- con't voiding/toileting schedule 10. H/o depression/anxiety: Continue Effexor, Wellbutrin and lorazepam prn. 11. H/o Migraines: Continue Topamax.  Has had 4x since admission- may use Nurtec prn since this is not a vasoconstrictor; increased Topamax to '100mg'$  BID 12. RML nodule: Recommend repeat CT in 12 months. 13. Hyponatremia: Resolved  14. Acute on chronic anemia of chronic disease:  Hgb 11.6 at  admission-->9.9 >10.6>9.7>10.4, recheck prior to d/c 15. FUO: WBC wnl             --Continue to monitor for fevers or other signs of infection. 16. Poor appetite- eating 50-100% currently 17. Constipation vs poor appetite- LBM 6 days ago- suggest KUB.    Moved bowels yesterday  18.  Somnolence ,improved  pt with poor sleep  ambien    -seems improved   LOS: 20 days A FACE TO FACE EVALUATION WAS PERFORMED  ACharlett Blake8/11/2020, 9:52 AM

## 2021-06-28 NOTE — Progress Notes (Addendum)
Occupational Therapy Session Note  Patient Details  Name: Michaela Rios MRN: [id number] Date of Birth: January 22, 1951  Today's Date: 06/28/2021 OT Individual Time: [id number] OT Individual Time Calculation (min): 45 min    Short Term Goals: Week 3:  OT Short Term Goal 1 (Week 3): STG = LTG 2/2 ELOS  Skilled Therapeutic Interventions/Progress Updates:   Pt supine with no c/o pain, agreeable to OT session. Sit > stand from EOB with CGA. Ambulatory transfer with CGA to the sink. She sat in the w/c and completed grooming tasks with set up assist. UB bathing and dressing with set up assist. CGA to stand from the w/c. Pt completed LB dressing with CGA. Stand with CGA at the sink. Pt ended session with 1 set of 10 mini squats in standing with BUE forward reach. Pt was left sitting up in the w/c with the chair alarm belt on, all needs within reach.   Therapy Documentation Precautions:  Precautions Precautions: Fall Precaution Comments: mild L hemiparesis Restrictions Weight Bearing Restrictions: No    Therapy/Group: Individual Therapy  Curtis Sites 06/28/2021, 11:03 AM

## 2021-06-28 NOTE — Progress Notes (Signed)
Physical Therapy Session Note  Patient Details  Name: Michaela Rios MRN: [id number] Date of Birth: 1951-09-30  Today's Date: 06/28/2021 PT Individual Time: [id number] and 1435-1551 PT Individual Time Calculation (min): 42 min and 76 min  Short Term Goals: Week 3:  PT Short Term Goal 1 (Week 3): STG = LTG d/t remaining LOS   Skilled Therapeutic Interventions/Progress Updates:  Session 1:   Patient supine in bed on entrance to room. Patient alert and agreeable to PT session. Patient denied pain during session.  Therapeutic Activity: Bed Mobility: Patient performed supine <> sit with Mod I. No cusing require to complete transition, but vc required for pt to complete positioning in bed for improved posture with bed adjustments.  Transfers: Patient performed STS and SPVT transfers throughout session with close supervision. Provided verbal cues for reaching back to target seat.  Gait Training:  Patient ambulated 125' x4 using no AD, with RW, and then with [facility] x2 in order to work on [place] of cane with steps. Demonstrated better quality of gait with RW and with no AD. However, pt does not want to return home with walker as she says it is cumbersome. Rollator acquired at end of session  to attempt in next session this afternoon.  Pt guided in stepping over 8" and 6" obstacles and requires vc/ tc for increased step height and sequence of weight shift for improved performance.   Patient seated EOB at end of session with brakes locked, bed alarm set, and all needs within reach. SLP arriving for next session.     Session 2: Patient supine in bed on entrance to room. Husband present for education session. Patient alert and agreeable to PT session. Patient with no pain complaint throughout session although did relate low back soreness at end of session from increased amount of ambulation this day.   Therapeutic Activity: Bed Mobility: Patient performed supine <> sit with supervision. Related  to husband that pt should not require physical assist with bed mobility.  Transfers: Patient performed STS and SPVT transfers with close supervision.Marland Kitchen demonstrated to husband re: where to hover hands near gait belt and in front of pt's shoulder. In the case that her balance shifts forward and she requires light assist to position to seat posteriorly. Able to return demonstrate. Provided verbal cues for weight shift and bringing hands to seat.  Gait Training:  Patient ambulated 125' x2 once with [facility] and once with rollator. Pt relates liking rollator stability and mobility. Educated husband and pt re: use of brakes positioning for using seat, and use of brakes during ambulation. Demonstrated good technique with use of rollator though will continue to require cueing for brake application for now. Provided vc/ tc for AD use. Husband also likes rollator for pt. Demonstrated to husband how to collapse rollator for storage in car.   Pt guided in stepping over 8" and 6" obstacles with no AD and CGA and is able to clear all obstacles. VC/ tc required.   Neuromuscular Re-ed: NMR facilitated during session with focus on standing balance. Pt guided in standing balance against perturbations with good example of stepping strategy. Education to husband re: pt's progress with balance. Progress to kicking cones over with LLE and with RLE. Extra time required bilaterally for finding balance over single leg and initiating lift of contralateral LE. Good, coordinated movement with decreased awareness of balance and requirement for up to Min A for balance intermittently. Education to husband re: increased need for physical assist in balance  with NBOS activities, more dynamic standing activities.  NMR performed for improvements in motor control and coordination, balance, sequencing, judgement, and self confidence/ efficacy in performing all aspects of mobility at highest level of independence.   Patient supine  in bed at end  of session with brakes locked, bed alarm set, and all needs within reach.  Husband with questions re: logistics of discharge day. Answered to best knowledge of this PT and referred other questions to SW/ nsg. Husband and pt with questions re: recommendation for HH vs OP. Recommendation for OP d/t increased focus on balance if pt attends neuro OP.  Husband also questions pt's ability to participate in more activity during day. Related to husband that pt has been stating that she wishes she could do more in her room without supervision so that she could spend more time on her feet as she thinks this will improve her quality of balance and safety. Husband happy to hear this.   Therapy Documentation Precautions:  Precautions Precautions: Fall Precaution Comments: mild L hemiparesis Restrictions Weight Bearing Restrictions: No  Therapy/Group: Individual Therapy  Alger Simons PT, DPT 06/28/2021, 12:23 PM

## 2021-06-28 NOTE — Progress Notes (Signed)
Speech Language Pathology Daily Session Note  Patient Details  Name: MARTISHA PAONESSA MRN: [id number] Date of Birth: 1950-12-30  Today's Date: 06/28/2021 SLP Individual Time: 1345-1430 SLP Individual Time Calculation (min): 45 min  Short Term Goals: Week 3: SLP Short Term Goal 1 (Week 3): STG=LTG d/t ELOS  Skilled Therapeutic Interventions: Patient agreeable to skilled ST intervention with focus on cognitive goals. SLP facilitated mildly complex deductive reasoning/problem solving task using novel "Rush [place]" game with min-to-mod A verbal and visual cues for initiation, problem solving, and sustained attention. Spouse arrived toward end of session and SLP provided education on progress with ST, results of cognitive-linguistic re-assessment, and cognitive-communication strategies including increased processing time, minimizing distractions, and use of compensatory memory strategies for improved recall. Spouse verbalized understanding through teach back and appreciative for the update.Patient was left in bed with alarm activated and immediate needs within reach at end of session. Continue per current plan of care.      Pain Pain Assessment Pain Scale: 0-10 Pain Score: 0-No pain  Therapy/Group: Individual Therapy  Patty Sermons 06/28/2021, 2:17 PM

## 2021-06-29 NOTE — Progress Notes (Signed)
Occupational Therapy Discharge Summary  Patient Details  Name: Michaela Rios MRN: [id number] Date of Birth: [birth year]   Patient has met 90 of 25 long term goals due to improved activity tolerance, improved balance, postural control, ability to compensate for deficits, functional use of  LEFT upper extremity, improved attention, improved awareness, and improved coordination.  Patient to discharge at overall Supervision level.  Patient's care partner is independent to provide the necessary physical and cognitive assistance at discharge.    Reasons goals not met: NA  Recommendation:  Patient will benefit from ongoing skilled OT services in outpatient setting to continue to advance functional skills in the area of BADL, iADL, and Reduce care partner burden.  Equipment: 3in1  Reasons for discharge: treatment goals met and discharge from hospital  Patient/family agrees with progress made and goals achieved: Yes  OT Discharge Precautions/Restrictions  Precautions Precautions: Fall Precaution Comments: mild L hemiparesis Restrictions Weight Bearing Restrictions: No  ADL ADL Eating: Independent Where Assessed-Eating: Wheelchair Grooming: Supervision/safety Where Assessed-Grooming: Sitting at sink Upper Body Bathing: Supervision/safety Where Assessed-Upper Body Bathing: Sitting at sink Lower Body Bathing: Supervision/safety Where Assessed-Lower Body Bathing: Standing at sink Upper Body Dressing: Supervision/safety Where Assessed-Upper Body Dressing: Sitting at sink Lower Body Dressing: Supervision/safety Where Assessed-Lower Body Dressing: Standing at sink Toileting: Contact guard Where Assessed-Toileting: Glass blower/designer: Therapist, music Method: Counselling psychologist: Grab bars, Raised toilet seat, Bedside commode Tub/Shower Transfer: Not assessed Tub/Shower Transfer Method: Ambulating Tub/Shower Equipment: Grab bars, Leisure centre manager: Contact guard Vision Baseline Vision/History:  (Wears 1 contact) Wears Glasses: At all times Patient Visual Report: No change from baseline Vision Assessment?: Yes Eye Alignment: Within Functional Limits Ocular Range of Motion: Within Functional Limits Alignment/Gaze Preference: Within Defined Limits Tracking/Visual Pursuits: Able to track stimulus in all quads without difficulty Saccades: Additional eye shifts occurred during testing;Decreased speed of saccadic movement Convergence: Impaired (comment) Visual Fields: No apparent deficits Perception  Perception: Impaired Inattention/Neglect: Does not attend to left side of body Spatial Orientation: Mild Praxis Praxis: Impaired Praxis Impairment Details: Motor planning;Initiation Praxis-Other Comments: Cont to req occasional cues for initiation and safe motor planning of transfers, improved from eval. Cognition Overall Cognitive Status: Impaired/Different from baseline Arousal/Alertness: Awake/alert Orientation Level: Oriented X4 Attention: Sustained Sustained Attention: Appears intact Memory: Impaired Immediate Memory Recall: Sock;Blue;Bed Memory Recall Sock: Without Cue Memory Recall Blue: Without Cue Memory Recall Bed: Without Cue Problem Solving: Impaired Safety/Judgment: Appears intact Sensation Sensation Light Touch: Appears Intact Hot/Cold: Appears Intact Proprioception: Impaired Detail (mild L hemiparesis) Stereognosis: Appears Intact Coordination Gross Motor Movements are Fluid and Coordinated: No Fine Motor Movements are Fluid and Coordinated: No Coordination and Movement Description: mild L hemiparesis, slowed gross motor movements 2/2 decreased motor planning/dynamic standing balance Finger Nose Finger Test: mild dysmetria with decreased speed bilaterally; R>L 9 Hole Peg Test: R: 56 secs, L: 59 secs Motor  Motor Motor: Hemiplegia Motor - Discharge Observations: mild L  hemiplegia, decreased dynamic standing balance, but improved from eval Mobility  Bed Mobility Bed Mobility: Supine to Sit;Sit to Supine Supine to Sit: Independent with assistive device Sit to Supine: Independent with assistive device Transfers Sit to Stand: Supervision/Verbal cueing Stand to Sit: Supervision/Verbal cueing  Trunk/Postural Assessment  Cervical Assessment Cervical Assessment: Within Functional Limits Thoracic Assessment Thoracic Assessment: Within Functional Limits Lumbar Assessment Lumbar Assessment: Exceptions to [facility] (posterior pelvic tilt) Postural Control Postural Control: Deficits on evaluation  Balance Balance Balance Assessed: Yes Static Sitting Balance Static Sitting -  Balance Support: Feet supported Static Sitting - Level of Assistance: 6: Modified independent (Device/Increase time) Dynamic Sitting Balance Dynamic Sitting - Balance Support: Feet supported Dynamic Sitting - Level of Assistance: 5: Stand by assistance Dynamic Sitting Balance - Compensations: S Static Standing Balance Static Standing - Balance Support: During functional activity;No upper extremity supported Static Standing - Level of Assistance: 5: Stand by assistance Static Standing - Comment/# of Minutes: CGA Dynamic Standing Balance Dynamic Standing - Balance Support: During functional activity;Bilateral upper extremity supported Dynamic Standing - Level of Assistance: 5: Stand by assistance Dynamic Standing - Comments: CGA Extremity/Trunk Assessment RUE Assessment RUE Assessment: Within Functional Limits Active Range of Motion (AROM) Comments: 3/4 to full shoulder flexion General Strength Comments: grossly WFL for ADL , 5/5 in shoulder flexion; 21 lbs grip strength LUE Assessment LUE Assessment: Exceptions to [facility] Active Range of Motion (AROM) Comments: 3/4 to full shoulder flexion General Strength Comments: 4/5 in shoulder flexion; 19 lb grip strength; mild L hemi LUE Body System:  Neuro Brunstrum levels for arm and hand: Arm;Hand Brunstrum level for arm: Stage V Relative Independence from Synergy Brunstrum level for hand: Stage V Independence from basic synergies;Stage VI Isolated joint movements   Volanda Napoleon MS, OTR/L   06/29/2021, 7:59 AM

## 2021-06-29 NOTE — Progress Notes (Signed)
PROGRESS NOTE   Subjective/Complaints: Pt asking who would rx percocet (not using ) and topamax post d/c Discussed that initial topamax  rx would be given at d/c and will need to f/u with neuro for topamax and PCP for percocet if needed   ROS: Patient denies CP, SOB, N/V/D   Objective:   No results found. No results for input(s): WBC, HGB, HCT, PLT in the last 72 hours.  No results for input(s): NA, K, CL, CO2, GLUCOSE, BUN, CREATININE, CALCIUM in the last 72 hours.    Intake/Output Summary (Last 24 hours) at 06/29/2021 0823 Last data filed at 06/28/2021 1903 Gross per 24 hour  Intake 0 ml  Output --  Net 0 ml         Physical Exam: Vital Signs Blood pressure 121/87, pulse 92, temperature 98.9 F (37.2 C), temperature source Oral, resp. rate 18, height '5\' 3"'$  (1.6 m), weight 56.1 kg, SpO2 97 %.  General: No acute distress Mood and affect are appropriate Heart: Regular rate and rhythm no rubs murmurs or extra sounds Lungs: Clear to auscultation, breathing unlabored, no rales or wheezes Abdomen: Positive bowel sounds, soft nontender to palpation, nondistended Extremities: No clubbing, cyanosis, or edema  Skin: ecchymosis and swelling but no open areas dorsum of RIght hand Neurologic: Cranial nerves II through XII intact, motor strength is 4-5/5 in bilateral deltoid, bicep, tricep, grip, hip flexor, knee extensors, ankle dorsiflexor and plantar flexor. No sensory changes  Musculoskeletal: Full ROM, No pain with AROM or PROM in the neck, trunk, or extremities. Posture appropriate    Assessment/Plan: 1. Functional deficits which require 3+ hours per day of interdisciplinary therapy in a comprehensive inpatient rehab setting. Physiatrist is providing close team supervision and 24 hour management of active medical problems listed below. Physiatrist and rehab team continue to assess barriers to discharge/monitor patient  progress toward functional and medical goals  Care Tool:  Bathing    Body parts bathed by patient: Right arm, Left arm, Chest, Face, Front perineal area, Abdomen, Right upper leg, Left upper leg, Buttocks   Body parts bathed by helper: Buttocks     Bathing assist Assist Level: Contact Guard/Touching assist     Upper Body Dressing/Undressing Upper body dressing   What is the patient wearing?: Pull over shirt    Upper body assist Assist Level: Set up assist    Lower Body Dressing/Undressing Lower body dressing      What is the patient wearing?: Pants, Underwear/pull up     Lower body assist Assist for lower body dressing: Contact Guard/Touching assist     Toileting Toileting    Toileting assist Assist for toileting: Contact Guard/Touching assist     Transfers Chair/bed transfer  Transfers assist     Chair/bed transfer assist level: Contact Guard/Touching assist     Locomotion Ambulation   Ambulation assist      Assist level: Contact Guard/Touching assist Assistive device: No Device Max distance: 12f   Walk 10 feet activity   Assist     Assist level: Contact Guard/Touching assist Assistive device: No Device   Walk 50 feet activity   Assist Walk 50 feet with 2 turns activity did  not occur: Safety/medical concerns  Assist level: Contact Guard/Touching assist Assistive device: No Device    Walk 150 feet activity   Assist Walk 150 feet activity did not occur: Safety/medical concerns  Assist level: Minimal Assistance - Patient > 75% Assistive device: No Device    Walk 10 feet on uneven surface  activity   Assist Walk 10 feet on uneven surfaces activity did not occur: Safety/medical concerns         Wheelchair     Assist Will patient use wheelchair at discharge?: No   Wheelchair activity did not occur: N/A         Wheelchair 50 feet with 2 turns activity    Assist    Wheelchair 50 feet with 2 turns activity did  not occur: N/A       Wheelchair 150 feet activity     Assist  Wheelchair 150 feet activity did not occur: N/A       Blood pressure 121/87, pulse 92, temperature 98.9 F (37.2 C), temperature source Oral, resp. rate 18, height '5\' 3"'$  (1.6 m), weight 56.1 kg, SpO2 97 %.   Medical Problem List and Plan: 1.  B/L weakness secondary to R BG and B/L thalamic infarcts             -patient may  shower             -ELOS/Goals: 07/01/21 supervision to min A, team conf in am   -Continue CIR therapies including PT, OT, and SLP 2.  Antithrombotics: -DVT/anticoagulation:  Pharmaceutical: Lovenox             -antiplatelet therapy: DAPT X 3 weeks followed by ASA alone.  3. Pain Management: Tylenol prn. Has migraines- cannot take Imitrex- generally controlled but had one ~6p yesterday that lasted >1h- monitor on current meds   Some breakthrough HA topamax '100mg'$  BID, will try fioricet  7/30: improved, continue above regimen 4. Mood: LCSW to follow for evaluation and support.              -antipsychotic agents: N/A 5. Neuropsych: This patient is not  capable of making decisions on his own behalf. 6. Skin/Wound Care: Routine pressure relief measures.  7. Fluids/Electrolytes/Nutrition: encourage PO, HypoK+ resolved with KCL 60mq daily , recheck prior to D/C 8. HTN: Monitor BP tid-- Vitals:   06/28/21 1951 06/29/21 0528  BP: 121/81 121/87  Pulse: 94 92  Resp: 20 18  Temp: 98.2 F (36.8 C) 98.9 F (37.2 C)  SpO2: 100% 97%   8/1 well controlled  9. CKD III: Has been incontinent since admission. Will monitor voiding with PVR checkss.             --set toileting schedule.  7/17- voiding some on toilet- incontinence gradually improving- con't voiding/toileting schedule 10. H/o depression/anxiety: Continue Effexor, Wellbutrin and lorazepam prn. 11. H/o Migraines: Continue Topamax.  Has had 4x since admission- may use Nurtec prn since this is not a vasoconstrictor; increased Topamax to '100mg'$   BID 12. RML nodule: Recommend repeat CT in 12 months. 13. Hyponatremia: Resolved  14. Acute on chronic anemia of chronic disease:  Hgb 11.6 at admission-->9.9 >10.6>9.7>10.4, recheck prior to d/c 15. FUO: WBC wnl             --Continue to monitor for fevers or other signs of infection. 16. Poor appetite- eating 50-100% currently 17. Constipation vs poor appetite- LBM 6 days ago- suggest KUB.    Moved bowels yesterday  18.  Somnolence ,improved  pt with poor sleep  ambien    -seems improved   LOS: 21 days A FACE TO FACE EVALUATION WAS PERFORMED  Charlett Blake 06/29/2021, 8:23 AM

## 2021-06-29 NOTE — Progress Notes (Signed)
Occupational Therapy Session Note  Patient Details  Name: Michaela Rios MRN: [id number] Date of Birth: 11-05-1951  Today's Date: 06/29/2021 OT Individual Time: 1587-2761 OT Individual Time Calculation (min): 53 min    Short Term Goals: Week 3:  OT Short Term Goal 1 (Week 3): STG = LTG 2/2 ELOS  Skilled Therapeutic Interventions/Progress Updates:    Pt received semi-reclined in bed, no c/o pain, agreeable to therapy. Session focus on self-care retraining, activity tolerance, func transsfers in prep for improved ADL/IADL/func mobility performance + decreased caregiver burden. Pt came to sitting EOB with distant S and able to don B shoes. Stand-pivot no AD with CGA > w/c. Seated at sink with set-up A, pt able to complete UBD + UBB. Pt requesting to use restroom, trialed amb with new rollator placed in room. Req CGA for balance and mod Vcs for safe rollator management and motor planning. Additionally req assist to lock/unlock brakes due to weak grasp. Demonstrated difficulty maneuvering rollator in bathroom space and req Vcs to sequence safely. Managed toileting tasks with CGA for balance and use of RUE on grab bar. Continent void of bladder. Req cuing to retrieve toilet paper. Amb back to w/c in room with min HHA and no AD. Completed LBD with increased time and CGA for STS at sink. Set-up A for breakfast to open milk. Provided printed handouts for rec of CGA with mobility at home, energy conservation principles, how to decrease falls risk with ADL, stroke support group information, and activities to improve B [place]. Will review with husband tomorrow during fam ed, as well. Pt with no further questions at this time.  Pt left seated in w/c eating breakfast with safety belt alarm engaged, call bell in reach, and all immediate needs met.    Therapy Documentation Precautions:  Precautions Precautions: Fall Precaution Comments: mild L hemiparesis Restrictions Weight Bearing Restrictions: No  Pain:  no c/o   ADL: See Care Tool for more details.  Therapy/Group: Individual Therapy  Volanda Napoleon MS, OTR/L  06/29/2021, 6:38 AM

## 2021-06-29 NOTE — Progress Notes (Signed)
Speech Language Pathology Daily Session Note  Patient Details  Name: Michaela Rios MRN: [id number] Date of Birth: 07/16/1951  Today's Date: 06/29/2021 SLP Individual Time: 1015-1100 SLP Individual Time Calculation (min): 45 min  Short Term Goals: Week 3: SLP Short Term Goal 1 (Week 3): STG=LTG d/Rios ELOS  Skilled Therapeutic Interventions: Patient agreeable to skilled ST intervention with focus on cognitive goals. SLP facilitated selective attention and problem solving skills with mildly complex word scramble activity with sup-to-min A verbal cues and extended processing time. Patient was able to maintain attention to task for duration of session in mildly distracting environment including background noise from hallway and low volume from TV. Patient demonstrated improvement with attention skills despite intentional environmental distractions. Patient was left in bed with alarm activated and immediate needs within reach at end of session. Continue per current plan of care.     Pain Pain Assessment Pain Scale: 0-10 Pain Score: 3  Pain Location: Head Pain Descriptors / Indicators: Headache  Therapy/Group: Individual Therapy  Michaela Rios Michaela Rios 06/29/2021, 10:30 AM

## 2021-06-29 NOTE — Progress Notes (Signed)
Occupational Therapy Session Note  Patient Details  Name: Michaela Rios MRN: [id number] Date of Birth: 1951/07/21   Today's Date: 06/29/2021 OT Individual Time: 4503-8882 OT Individual Time Calculation (min): 28 min    Short Term Goals: Week 2:  OT Short Term Goal 1 (Week 2): Pt will don pants with min A. OT Short Term Goal 2 (Week 2): Pt will complete 2/3 steps of toileting with CGA. OT Short Term Goal 3 (Week 2): Pt will complete ambulatory toilet transfer min A + LRAD.  Skilled Therapeutic Interventions/Progress Updates:    Pt received sleeping in w/c, easily awoken and no c/o pain. Pt agreeable to session. She was taken via w/c to the therapy gym. CGA stand pivot transfer to the mat. EOM worked on core stabilization exercises, 2x10 repetitions. Standing level BUE strengthening circuit to address dynamic standing balance with UE coordination, holding a 2lb dowel. CGA required throughout. Pt returned to her room and was left supine with all needs met, bed alarm set.   Therapy Documentation Precautions:  Precautions Precautions: Fall Precaution Comments: mild L hemiparesis Restrictions Weight Bearing Restrictions: No  Therapy/Group: Individual Therapy  Curtis Sites 06/29/2021, 6:21 AM

## 2021-06-29 NOTE — Plan of Care (Signed)
  Problem: Consults Goal: RH STROKE PATIENT EDUCATION Description: See Patient Education module for education specifics  Outcome: Progressing   Problem: RH BOWEL ELIMINATION Goal: RH STG MANAGE BOWEL WITH ASSISTANCE Description: STG Manage Bowel with min Assistance. Outcome: Progressing Goal: RH STG MANAGE BOWEL W/MEDICATION W/ASSISTANCE Description: STG Manage Bowel with Medication with Assistance. Outcome: Progressing   Problem: RH BLADDER ELIMINATION Goal: RH STG MANAGE BLADDER WITH ASSISTANCE Description: STG Manage Bladder With min  Assistance Outcome: Progressing   Problem: RH SAFETY Goal: RH STG ADHERE TO SAFETY PRECAUTIONS W/ASSISTANCE/DEVICE Description: STG Adhere to Safety Precautions With Assistance/Device. Outcome: Progressing   Problem: RH COGNITION-NURSING Goal: RH STG USES MEMORY AIDS/STRATEGIES W/ASSIST TO PROBLEM SOLVE Description: STG Uses Memory Aids/Strategies With cues/reminders Assistance to Problem Solve. Outcome: Progressing   Problem: RH PAIN MANAGEMENT Goal: RH STG PAIN MANAGED AT OR BELOW PT'S PAIN GOAL Description: At or below level 4 Outcome: Progressing   Problem: RH KNOWLEDGE DEFICIT Goal: RH STG INCREASE KNOWLEDGE OF DIABETES Description: Patient will be able to manage prediabetes with dietary modifications using handouts and educational tools with cues/reminders Outcome: Progressing Goal: RH STG INCREASE KNOWLEDGE OF HYPERTENSION Description: Patient will be able to manage HTN with medications and dietary modifications using handouts and educational tools with cues/reminders Outcome: Progressing Goal: RH STG INCREASE KNOWLEDGE OF STROKE PROPHYLAXIS Description: Patient will be able to manage secondary stroke risks with meds and dietary modifications using handouts and educational tools with cues/reminders Outcome: Progressing

## 2021-06-30 LAB — CBC
HCT: 32.3 % — ABNORMAL LOW (ref 36.0–46.0)
Hemoglobin: 10.1 g/dL — ABNORMAL LOW (ref 12.0–15.0)
MCH: 28.5 pg (ref 26.0–34.0)
MCHC: 31.3 g/dL (ref 30.0–36.0)
MCV: 91.2 fL (ref 80.0–100.0)
Platelets: 303 10*3/uL (ref 150–400)
RBC: 3.54 MIL/uL — ABNORMAL LOW (ref 3.87–5.11)
RDW: 16.7 % — ABNORMAL HIGH (ref 11.5–15.5)
WBC: 7.7 10*3/uL (ref 4.0–10.5)
nRBC: 0 % (ref 0.0–0.2)

## 2021-06-30 LAB — BASIC METABOLIC PANEL
Anion gap: 9 (ref 5–15)
BUN: 15 mg/dL (ref 8–23)
CO2: 21 mmol/L — ABNORMAL LOW (ref 22–32)
Calcium: 9 mg/dL (ref 8.9–10.3)
Chloride: 108 mmol/L (ref 98–111)
Creatinine, Ser: 0.91 mg/dL (ref 0.44–1.00)
GFR, Estimated: >60 mL/min (ref >60)
Glucose, Bld: 97 mg/dL (ref 70–99)
Potassium: 3.9 mmol/L (ref 3.5–5.1)
Sodium: 138 mmol/L (ref 135–145)

## 2021-06-30 MED ORDER — POTASSIUM CHLORIDE CRYS ER 10 MEQ PO TBCR: 10.0000 meq | EXTENDED_RELEASE_TABLET | Freq: Every day | ORAL | 0 refills | Status: DC

## 2021-06-30 MED ORDER — ADULT MULTIVITAMIN W/MINERALS CH: 1.0000 | ORAL_TABLET | Freq: Every day | ORAL | Status: AC

## 2021-06-30 MED ORDER — TIZANIDINE HCL 4 MG PO TABS: ORAL_TABLET | ORAL | 0 refills | Status: AC

## 2021-06-30 MED ORDER — SENNOSIDES-DOCUSATE SODIUM 8.6-50 MG PO TABS: 2.0000 | ORAL_TABLET | Freq: Every day | ORAL | 0 refills | Status: DC

## 2021-06-30 MED ORDER — ACETAMINOPHEN ER 650 MG PO TBCR: 650.0000 mg | EXTENDED_RELEASE_TABLET | Freq: Three times a day (TID) | ORAL | Status: AC | PRN

## 2021-06-30 MED ORDER — TOPIRAMATE 100 MG PO TABS: 100.0000 mg | ORAL_TABLET | Freq: Two times a day (BID) | ORAL | 0 refills | Status: DC

## 2021-06-30 MED ORDER — BUTALBITAL-APAP-CAFFEINE 50-325-40 MG PO TABS: 1.0000 | ORAL_TABLET | Freq: Four times a day (QID) | ORAL | 0 refills | Status: AC | PRN

## 2021-06-30 NOTE — Progress Notes (Signed)
Patient ID: Michaela Rios, female   DOB: August 12, 1951, 70 y.o.   MRN: [id number]  Rollater and Bedside commode ordered through [place], [place]

## 2021-06-30 NOTE — Plan of Care (Signed)
  Problem: RH Problem Solving Goal: LTG Patient will demonstrate problem solving for (SLP) Description: LTG:  Patient will demonstrate problem solving for basic/complex daily situations with cues  (SLP) Outcome: Completed/Met   Problem: RH Memory Goal: LTG Patient will demonstrate ability for day to day (SLP) Description: LTG:   Patient will demonstrate ability for day to day recall/carryover during cognitive/linguistic activities with assist  (SLP) Outcome: Completed/Met Goal: LTG Patient will use memory compensatory aids to (SLP) Description: LTG:  Patient will use memory compensatory aids to recall biographical/new, daily complex information with cues (SLP) Outcome: Completed/Met   Problem: RH Attention Goal: LTG Patient will demonstrate this level of attention during functional activites (SLP) Description: LTG:  Patient will will demonstrate this level of attention during functional activites (SLP) Outcome: Completed/Met   Problem: RH Awareness Goal: LTG: Patient will demonstrate awareness during functional activites type of (SLP) Description: LTG: Patient will demonstrate awareness during functional activites type of (SLP) Outcome: Completed/Met

## 2021-06-30 NOTE — Progress Notes (Signed)
Speech Language Pathology Discharge Summary  Patient Details  Name: Michaela Rios MRN: [id number] Date of Birth: Apr 26, 1951  Today's Date: 06/30/2021 SLP Individual Time: 0815-0915 SLP Individual Time Calculation (min): 60 min  Skilled Therapeutic Interventions:  Patient agreeable to skilled ST intervention with focus on cognitive goals. SLP facilitated alternating attention, verbal reasoning, and recall skills with sup-to-min A verbal cues for processing. Patient completed today's tasks in a mildly distracting environment for duration of session with minimal-to-no redirection necessary, and was able to alternate her attention back to task efficiently when staff entered room or caused unintentional distraction. Patient was left in bed with alarm activated and immediate needs within reach at end of session. Continue per current plan of care.    Patient has met 5 of 5 long term goals.  Patient to discharge at overall Supervision;Min level.   Reasons goals not met: N/A,; All goals met   Clinical Impression/Discharge Summary: Patient has made excellent progress and has met 5 of 5 long-term goals this admission due to improved cognitive-linguistic skills including improved sustained and selective attention, working and short-term recall, problem solving, verbal reasoning, left visual scanning, and emergent awareness. Cognitive-linguistic gains are further supported by improvement on SLUMS scores during admission as evidenced by score 23/30 on 06/25/21, as compared to initial score of 17/30 collected on 06/09/21. Patient is currently an overall sup-to-min A for cognitive tasks and requires intermittent cues for utilization of compensatory memory aids, and minimizing environmental distractions to promote attention to task. Patient and family education is complete and patient will discharge home with supervision from patient's care partner who is independent to provide necessary cognitive assistance at  discharge. Patient would benefit from continued SLP services to maximize cognitive-linguisic function and functional independence.    Care Partner:  Caregiver Able to Provide Assistance: Yes  Type of Caregiver Assistance: Physical;Cognitive  Recommendation:  Outpatient SLP;Home Health SLP  Rationale for SLP Follow Up: Maximize cognitive function and independence;Reduce caregiver burden   Equipment: NA   Reasons for discharge: Treatment goals met   Patient/Family Agrees with Progress Made and Goals Achieved: Yes   Patty Sermons, M.S., CCC-SLP   Everson Mott T Rolin Schult 06/30/2021, 3:45 PM

## 2021-06-30 NOTE — Progress Notes (Signed)
Physical Therapy Session Note  Patient Details  Name: Michaela Rios MRN: [id number] Date of Birth: 1950/12/30  Today's Date: 06/29/2021 PT Individual Time:  1305-1410  PT Individual Time Calculation (min): 65 min    Short Term Goals: Week 3:  PT Short Term Goal 1 (Week 3): STG = LTG d/t remaining LOS  Skilled Therapeutic Interventions/Progress Updates:  Patient supine in bed on entrance to room. Patient alert and agreeable to PT session although she relates that she wasn't feeling well prior and requested medication from nsg. Received nausea medication and muscle relaxer prior to lunch arrival. Patient denied pain during session.  Therapeutic Activity: Bed Mobility: Patient performed supine <> sit with supervision requiring extra time to complete and vc for initiaiton. . Transfers: Patient performed STS and SPVT transfers throughout session with supervision and intermittent CGA for pt's balance. Transfers performed to no AD and to rollator.  Provided verbal cues for technique and safety.  Gait Training:  Patient ambulated 200' x2 using rollator with very close supervision. Demonstrated intermittent adjustments to balance in order to continue forward motion. Provided vc/ tc for upright posture and leveling of shoulders.   Neuromuscular Re-ed: NMR facilitated during session with focus on standing balance and motor control. Pt guided in performance of TUG with improved score to 28.43 seconds with focus on task. Overall average of 40.67 seconds when distracted visually and/ or auditorally. Pt also completes reaching activity with dynamic stepping and coordination task for BLE. Continued difficulty with self-efficacy and freezing with single leg balance and lift of conralateral LE to hit target.  She requires xtra long time to initiate but demos good performance. NMR performed for improvements in motor control and coordination, balance, sequencing, judgement, and self confidence/ efficacy in  performing all aspects of mobility at highest level of independence.   Patient supine  in bed at end of session with brakes locked, bed alarm set, and all needs within reach.   Therapy Documentation Precautions:  Precautions Precautions: Fall Precaution Comments: mild L hemiparesis Restrictions Weight Bearing Restrictions: No  Therapy/Group: Individual Therapy  Alger Simons PT, DPT 06/29/2021, 6:16 PM

## 2021-06-30 NOTE — Progress Notes (Signed)
Patient ID: Michaela Rios, female   DOB: Apr 19, 1951, 70 y.o.   MRN: [id number] Team Conference Report to Patient/Family  Team Conference discussion was reviewed with the patient and caregiver, including goals, any changes in plan of care and target discharge date.  Patient and caregiver express understanding and are in agreement.  The patient has a target discharge date of 07/01/21.  SW met with patient provided conference updates. Patient medically stable for d/c home on tomorrow. Patient reports headache has improved. Family education will be complete on today. SW met patient up at neuro OP, no additional questions or concerns.   Dyanne Iha 06/30/2021, 1:44 PM

## 2021-06-30 NOTE — Progress Notes (Signed)
Occupational Therapy Session Note  Patient Details  Name: Michaela Rios MRN: [id number] Date of Birth: 1951-09-15  Today's Date: 06/30/2021 OT Individual Time: [id number] OT Individual Time Calculation (min): 55 min    Short Term Goals: Week 3:  OT Short Term Goal 1 (Week 3): STG = LTG 2/2 ELOS  Skilled Therapeutic Interventions/Progress Updates:    Treatment session with focus on hands on education with pt and pt's husband.  Pt received upright in w/c reporting need to toilet.  Pt ambulated to toilet with Rollator with initial min cues for safety with Rollator and cues to lock brakes.  Pt's husband able to step in and provide cues and CGA during transfers and toileting hygiene.  Pt ambulated to sink and completed grooming tasks in standing with supervision/setup. Engaged in simulated walk-in shower transfer as pt with level entry.  Discussed recommendation for stepping in backwards to increase safety and hand placement on Rollator during transfers.  Therapist reiterated education handouts left by primary therapist in regards to fine motor control activities, energy conservation strategies, and overall reminders for transfers, mobility, and self-care tasks.  Pt ambulated ~50' with Rollator and then reports "swimmy headed" requiring seated rest break.  Pt returned to room and remained upright in w/c with seat belt alarm on and all needs in reach.  Therapy Documentation Precautions:  Precautions Precautions: Fall Precaution Comments: mild L hemiparesis Restrictions Weight Bearing Restrictions: No General:   Vital Signs: Therapy Vitals Temp: 97.7 F (36.5 C) Temp Source: Oral Pulse Rate: 96 BP: 113/78 Pain:  Pt with no c/o pain   Therapy/Group: Individual Therapy  Simonne Come 06/30/2021, 4:17 PM

## 2021-06-30 NOTE — Progress Notes (Signed)
PROGRESS NOTE   Subjective/Complaints:  Handicap sticker requested   Working with SLP no new issues   ROS: Patient denies CP, SOB, N/V/D   Objective:   No results found. Recent Labs    06/30/21 0503  WBC 7.7  HGB 10.1*  HCT 32.3*  PLT 303    Recent Labs    06/30/21 0503  NA 138  K 3.9  CL 108  CO2 21*  GLUCOSE 97  BUN 15  CREATININE 0.91  CALCIUM 9.0      Intake/Output Summary (Last 24 hours) at 06/30/2021 0911 Last data filed at 06/30/2021 0810 Gross per 24 hour  Intake 360 ml  Output --  Net 360 ml         Physical Exam: Vital Signs Blood pressure 126/87, pulse (!) 102, temperature 99.3 F (37.4 C), temperature source Oral, resp. rate 18, height _0  (1.6 m), weight 56.1 kg, SpO2 93 %.   General: No acute distress Mood and affect are appropriate Heart: Regular rate and rhythm no rubs murmurs or extra sounds Lungs: Clear to auscultation, breathing unlabored, no rales or wheezes Abdomen: Positive bowel sounds, soft nontender to palpation, nondistended Extremities: No clubbing, cyanosis, or edema Skin: No evidence of breakdown, no evidence of rash  Skin: ecchymosis and swelling but no open areas dorsum of RIght hand Neurologic: Cranial nerves II through XII intact, motor strength is 4-5/5 in bilateral deltoid, bicep, tricep, grip, hip flexor, knee extensors, ankle dorsiflexor and plantar flexor. No sensory changes  Musculoskeletal: Full ROM, No pain with AROM or PROM in the neck, trunk, or extremities. Posture appropriate    Assessment/Plan: 1. Functional deficits which require 3+ hours per day of interdisciplinary therapy in a comprehensive inpatient rehab setting. Physiatrist is providing close team supervision and 24 hour management of active medical problems listed below. Physiatrist and rehab team continue to assess barriers to discharge/monitor patient progress toward functional and  medical goals  Care Tool:  Bathing    Body parts bathed by patient: Right arm, Left arm, Chest, Face, Front perineal area, Abdomen, Right upper leg, Left upper leg, Buttocks   Body parts bathed by helper: Buttocks     Bathing assist Assist Level: Contact Guard/Touching assist     Upper Body Dressing/Undressing Upper body dressing   What is the patient wearing?: Pull over shirt    Upper body assist Assist Level: Set up assist    Lower Body Dressing/Undressing Lower body dressing      What is the patient wearing?: Pants, Underwear/pull up     Lower body assist Assist for lower body dressing: Contact Guard/Touching assist     Toileting Toileting    Toileting assist Assist for toileting: Contact Guard/Touching assist     Transfers Chair/bed transfer  Transfers assist     Chair/bed transfer assist level: Contact Guard/Touching assist     Locomotion Ambulation   Ambulation assist      Assist level: Contact Guard/Touching assist Assistive device: No Device Max distance: 159f   Walk 10 feet activity   Assist     Assist level: Contact Guard/Touching assist Assistive device: No Device   Walk 50 feet activity   Assist  Walk 50 feet with 2 turns activity did not occur: Safety/medical concerns  Assist level: Contact Guard/Touching assist Assistive device: No Device    Walk 150 feet activity   Assist Walk 150 feet activity did not occur: Safety/medical concerns  Assist level: Minimal Assistance - Patient > 75% Assistive device: No Device    Walk 10 feet on uneven surface  activity   Assist Walk 10 feet on uneven surfaces activity did not occur: Safety/medical concerns         Wheelchair     Assist Will patient use wheelchair at discharge?: No   Wheelchair activity did not occur: N/A         Wheelchair 50 feet with 2 turns activity    Assist    Wheelchair 50 feet with 2 turns activity did not occur: N/A        Wheelchair 150 feet activity     Assist  Wheelchair 150 feet activity did not occur: N/A       Blood pressure 126/87, pulse (!) 102, temperature 99.3 F (37.4 C), temperature source Oral, resp. rate 18, height _0  (1.6 m), weight 56.1 kg, SpO2 93 %.   Medical Problem List and Plan: 1.  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 Formulized individual treatment plan based on medical history, underlying problem and comorbidities.   -Continue CIR therapies including PT, OT, and SLP 2.  Antithrombotics: -DVT/anticoagulation:  Pharmaceutical: Lovenox             -antiplatelet therapy: DAPT X 3 weeks followed by ASA alone.  3. Pain Management: Tylenol prn. Has migraines- cannot take Imitrex- generally controlled but had one ~6p yesterday that lasted >1h- monitor on current meds   Some breakthrough HA topamax 160m BID, will try fioricet  7/30: improved, continue above regimen 4. Mood: LCSW to follow for evaluation and support.              -antipsychotic agents: N/A 5. Neuropsych: This patient is not  capable of making decisions on his own behalf. 6. Skin/Wound Care: Routine pressure relief measures.  7. Fluids/Electrolytes/Nutrition: encourage PO, HypoK+ resolved with KCL 171m daily , recheck prior to D/C 8. HTN: Monitor BP tid-- Vitals:   06/29/21 1945 06/30/21 0610  BP: 136/83 126/87  Pulse: 89 (!) 102  Resp: 18   Temp: 98.4 F (36.9 C) 99.3 F (37.4 C)  SpO2: 100% 93%   8/3 well controlled  9. CKD III: Has been incontinent since admission. Will monitor voiding with PVR checkss.             --set toileting schedule.  7/17- voiding some on toilet- incontinence gradually improving- con't voiding/toileting schedule 10. H/o  depression/anxiety: Continue Effexor, Wellbutrin and lorazepam prn. 11. H/o Migraines: Continue Topamax.  Has had 4x since admission- may use Nurtec prn since this is not a vasoconstrictor; increased Topamax to 10050mID 12. RML nodule: Recommend repeat CT in 12 months. 13. Hyponatremia: Resolved  14. Acute on chronic anemia of chronic disease:  Hgb 11.6 at admission-->9.9 >10.6>9.7>10.4,10.1 on 8/3 15. FUO: WBC wnl             --Continue to monitor  for fevers or other signs of infection. 16. Poor appetite- eating 50-100% currently 17. Constipation vs poor appetite- LBM 6 days ago- suggest KUB.    Moved bowels yesterday  18.  Somnolence ,improved  pt with poor sleep  ambien    -seems improved   LOS: 22 days A FACE TO [place] E Griffey Nicasio 06/30/2021, 9:11 AM

## 2021-06-30 NOTE — Progress Notes (Signed)
Inpatient Rehabilitation Care Coordinator Discharge Note  The overall goal for the admission was met for:   Discharge location: Yes, home   Length of Stay: Yes, 23 Days  Discharge activity level: Yes  Home/community participation: Yes  Services provided included: MD, RD, PT, OT, SLP, RN, CM, TR, Pharmacy, and SW  Financial Services: Private Insurance: [employer]  Choices offered to/list presented to: patient   Follow-up services arranged: Outpatient: [facility]  Comments (or additional information): PT OT Rolling Walker, Bedside Commode  Patient/Family verbalized understanding of follow-up arrangements: Yes  Individual responsible for coordination of the follow-up plan: self, [phone]  Confirmed correct DME delivered: Dyanne Iha 06/30/2021    Dyanne Iha

## 2021-06-30 NOTE — Progress Notes (Addendum)
Physical Therapy Session Note  Patient Details  Name: Michaela Rios MRN: [id number] Date of Birth: 10-19-1951  Today's Date: 06/30/2021 PT Individual Time: 1530-1650 PT Individual Time Calculation (min): 80 min   Short Term Goals: Week 3:  PT Short Term Goal 1 (Week 3): STG = LTG d/t remaining LOS  Skilled Therapeutic Interventions/Progress Updates:    Pt received sitting in wc and agreeable to therapy. Gait belt placed prior to initiation of mobility. Pt ambulated from room to 4w gym ~211f with rollator and close supervision. Pt required verbal cuing with navigating objects to the left.   Pt completed balance testing: Tug score of 38.12 seconds (A score of >13.5 seconds indicates patient is at a high fall risk. Pt educated on interpretation of their score). Berg Balance score of 40/56 (A score of <45 indicates the patient is at an increased risk for falls. Education provided to the patient on the interpretation of balance score.)  Biodex limits of stability testing with patient demonstrating overall improvement with a score of 64% accuracy in 1:11 (up from 41%).   Overall sensation and strength testing has improved from evaluation. Coordination has improved since evaluation and deficits are more noted now with fatigue. Pt demonstrated ability to consistently complete STS with supervision and at an increased velocity from eval.   Pt completed car transfer with supervision and verbal cuing for scooting back on seat prior to transitioning BLE into car simulator.   Gait training completed with no AD in several bouts, longest distance being ~2345fand CGA-close supervision. Verbal cuing for increased gait velocity and increased step length/height. Noted patient improved gait velocity when given an external cue to keep up with "pacer." Pt educated on importance of rollator for safety while ambulating at home.  Pt demonstrated HEP exercises and reviewed via handout at the end of  session: Lateral stepping  Toe Taps 10 each side  Modified Tandem stance (with progression to tandem) 3 x 30 sec hold  Feet together - eyes open 3 x 30 sec hold Feet together - eyes closed 3 x 10 sec hold *listed above with instructions to perform at the counter in case need of support  Sit to stands 3x5 (working up to 10) with focus on anterior weight shifting  Walking with rollator   Pt sitting in recliner with brakes locked, seatbelt alarm on, and call bell within reach. No further needs stated at this time.   Therapy Documentation Precautions:  Precautions Precautions: Fall Precaution Comments: mild L hemiparesis Restrictions Weight Bearing Restrictions: No General:   Vital Signs: Therapy Vitals Temp: 97.7 F (36.5 C) Temp Source: Oral Pulse Rate: 96 BP: 113/78 Pain: Pain Assessment Pain Scale: 0-10 Pain Score: 0-No pain Mobility: Bed Mobility Bed Mobility: Supine to Sit;Sit to Supine Supine to Sit: Independent with assistive device Sit to Supine: Independent with assistive device Transfers Transfers: Sit to Stand;Stand to Sit;Stand Pivot Transfers Sit to Stand: Supervision/Verbal cueing Stand to Sit: Supervision/Verbal cueing Stand Pivot Transfers: Supervision/Verbal cueing Stand Pivot Transfer Details: Verbal cues for gait pattern Stand Pivot Transfer Details (indicate cue type and reason): Vc for increased step length Locomotion : Gait Ambulation: Yes Gait Assistance: Contact Guard/Touching assist Gait Distance (Feet): 230 Feet Assistive device: None Gait Assistance Details: Verbal cues for gait pattern;Verbal cues for precautions/safety Gait Assistance Details: cuing for increase gait velocity and (intermittently) for increased step height/length Gait Gait: Yes Gait Pattern: Impaired Gait Pattern: Shuffle;Decreased step length - right;Decreased step length - left Gait velocity: decreased  High Level Ambulation High Level Ambulation: Side  stepping;Backwards walking Side Stepping: pt demonstrates decreased step length when stepping towards the left Backwards Walking: pt demonstrates short shuffled steps when backwards ambulating, mildly improved with verbal cuing Stairs / Additional Locomotion Stairs: Yes Stairs Assistance: Supervision/Verbal cueing Stair Management Technique: One rail Right Number of Stairs: 4 Height of Stairs: 6 Wheelchair Mobility Wheelchair Mobility: No  Trunk/Postural Assessment : Cervical Assessment Cervical Assessment: Within Functional Limits Thoracic Assessment Thoracic Assessment: Within Functional Limits Lumbar Assessment Lumbar Assessment: Exceptions to [facility] (posterior pelvic tilt) Postural Control Postural Control: Deficits on evaluation (decreased especially with fatigue and SLS)  Balance: Balance Balance Assessed: Yes Standardized Balance Assessment Standardized Balance Assessment: Berg Balance Test;Timed Up and Go Test Berg Balance Test Sit to Stand: Able to stand  independently using hands Standing Unsupported: Able to stand safely 2 minutes Sitting with Back Unsupported but Feet Supported on Floor or Stool: Able to sit safely and securely 2 minutes Stand to Sit: Controls descent by using hands Transfers: Able to transfer safely, minor use of hands Standing Unsupported with Eyes Closed: Able to stand 10 seconds with supervision Standing Ubsupported with Feet Together: Able to place feet together independently and stand for 1 minute with supervision From Standing, Reach Forward with Outstretched Arm: Can reach confidently >25 cm (10") From Standing Position, Pick up Object from Floor: Able to pick up shoe, needs supervision From Standing Position, Turn to Look Behind Over each Shoulder: Looks behind one side only/other side shows less weight shift Turn 360 Degrees: Able to turn 360 degrees safely but slowly Standing Unsupported, Alternately Place Feet on Step/Stool: Able to complete  >2 steps/needs minimal assist Standing Unsupported, One Foot in Front: Able to plae foot ahead of the other independently and hold 30 seconds Standing on One Leg: Unable to try or needs assist to prevent fall Total Score: 40 Timed Up and Go Test TUG: Normal TUG Normal TUG (seconds): 38.12    Therapy/Group: Individual Therapy  Mirai Greenwood, SPT 06/30/2021, 5:25 PM

## 2021-06-30 NOTE — Patient Care Conference (Signed)
Inpatient RehabilitationTeam Conference and Plan of Care Update Date: 06/30/2021   Time: 10:09 AM    Patient Name: Michaela Rios      Medical Record Number: [id number]  Date of Birth: May 06, 1951 Sex: Female         Room/Bed: 4W24C/4W24C-01 Payor Info: Payor: [employer] MEDICARE / Plan: [place] MEDICARE HMO/PPO / Product Type: *No Product type* /    Admit Date/Time:  06/08/2021  5:45 PM  Primary Diagnosis:  Embolic stroke of right basal ganglia [facility])  Hospital Problems: Principal Problem:   Embolic stroke of right basal ganglia [facility]) Active Problems:   Thalamic stroke [facility])    Expected Discharge Date: Expected Discharge Date: 07/01/21  Team Members Present: Physician leading conference: Dr. Alysia Penna Social Worker Present: Erlene Quan, BSW Nurse Present: Dorien Chihuahua, RN PT Present: Barrie Folk, PT OT Present: Willeen Cass, OT SLP Present: Sherren Kerns, SLP PPS Coordinator present : Gunnar Fusi, SLP     Current Status/Progress Goal Weekly Team Focus  Bowel/Bladder             Swallow/Nutrition/ Hydration             ADL's   S to set-up UBD, UBB, seated grooming; CGA  ambulatory bathroom transfers, toileting tasks, CGA LB bathing at sink  Downgraded to [place] for transfers, S for ADL; will meet  LUE NMR, balance/self-care/transfer retraining, pt/family education, activity tolerance, energy conservation, AE/DME education   Mobility   Bed mobility = supervision, Transfers = supervision, Gait = CGA/ intermittent supervision for balance  Overall SUP - will meet  discharging from therapy 8/3   Communication             Safety/Cognition/ Behavioral Observations  sup-to-min A. Performance fluctuates with motivation  Sup for problem solving and awareness, min A for memory and attention  executive functions, sustained attention, problem solving   Pain             Skin               Discharge Planning:  Patient set for discharge. Family education  complete. OP follow up   Team Discussion: Patient has made good progress.  Patient on target to meet rehab goals: Currently supervision for transfers. She requires CGA for use of a rollator and intermittent cues to correct loss of balance. She has met SLP goals for supervision - min assist with problem solving, attention and memory.  *See Care Plan and progress notes for long and short-term goals.   Revisions to Treatment Plan:  OT downgraded goals to CGA for transfers and supervision for ADLs and she has met those goals.  Teaching Needs: Transfers, toileting,medication, secondary stroke risk management, etc  Current Barriers to Discharge: Decreased caregiver support and Home enviroment access/layout  Possible Resolutions to Barriers: Family education completed     Medical Summary Current Status: HA controlled ,cont insomnia  Barriers to Discharge: Medical stability   Possible Resolutions to [employer] Focus: d/c in am, finalize equipment   Continued Need for Acute Rehabilitation Level of Care: The patient requires daily medical management by a physician with specialized training in physical medicine and rehabilitation for the following reasons: Direction of a multidisciplinary physical rehabilitation program to maximize functional independence : Yes Medical management of patient stability for increased activity during participation in an intensive rehabilitation regime.: Yes Analysis of laboratory values and/or radiology reports with any subsequent need for medication adjustment and/or medical intervention. : Yes   I attest that I was present,  lead the team conference, and concur with the assessment and plan of the team.   Margarito Liner 06/30/2021, 11:41 AM

## 2021-06-30 NOTE — Discharge Instructions (Addendum)
Inpatient Rehab Discharge Instructions  [place] Discharge date and time: 07/01/21   Activities/Precautions/ Functional Status: Activity: no lifting, driving, or strenuous exercise till cleared by MD Diet: cardiac diet Wound Care: none needed   Functional status:  ___ No restrictions     ___ Walk up steps independently _X__ 24/7 supervision/assistance   ___ Walk up steps with assistance ___ Intermittent supervision/assistance  ___ Bathe/dress independently ___ Walk with walker     ___ Bathe/dress with assistance ___ Walk Independently    ___ Shower independently ___ Walk with assistance    _X__ Shower with assistance __X_ No alcohol     ___ Return to work/school ________   Special Instructions: Family needs to assist with medication management.   COMMUNITY REFERRALS UPON DISCHARGE:    Outpatient: PT     OT                Agency: [facility]  Phone: [phone]              Appointment Date/Time: TBD  Medical Equipment/Items Ordered: Vassie Moselle, Bedside Commode                                                 Agency/Supplier: [employer]   My questions have been answered and I understand these instructions. I will adhere to these goals and the provided educational materials after my discharge from the hospital.  Patient/Caregiver Signature _______________________________ Date __________  Clinician Signature _______________________________________ Date __________  Please bring this form and your medication list with you to all your follow-up doctor's appointments.

## 2021-07-01 NOTE — Progress Notes (Signed)
Patient discharged with husband via private car. Provider explained discharge instructions. All belongings sent home with patient. Vital signs stable. All questions answered prior to discharge.

## 2021-07-01 NOTE — Discharge Summary (Signed)
Physician Discharge Summary  Patient ID: Michaela Rios MRN: [id number] DOB/AGE: Feb 03, 1951 70 y.o.  Admit date: 06/08/2021 Discharge date: 07/01/2021  Discharge Diagnoses:  Principal Problem:   Embolic stroke of right basal ganglia [facility]) Active Problems:   Essential hypertension   Right middle lobe pulmonary nodule   Migraines   Insomnia   Memory difficulties   Hypokalemia   Thalamic stroke [facility])   Discharged Condition: good  Significant Diagnostic Studies: DG Chest 2 View  Result Date: 06/14/2021 CLINICAL DATA:  Rales in the right chest on physical examination. EXAM: CHEST - 2 VIEW COMPARISON:  PA and lateral chest 05/24/2021. FINDINGS: The lungs are clear. Heart size is normal. Loop recorder noted. No pneumothorax or pleural fluid. No acute or focal bony abnormality. IMPRESSION: Negative chest. Electronically Signed   By: Inge Rise M.D.   On: 06/14/2021 15:01    Labs:  Basic Metabolic Panel: BMP Latest Ref Rng & Units 06/30/2021 06/25/2021 06/23/2021  Glucose 70 - 99 mg/dL 97 92 96  BUN 8 - 23 mg/dL '15 14 8  '$ Creatinine 0.44 - 1.00 mg/dL 0.91 0.85 0.80  Sodium 135 - 145 mmol/L 138 138 138  Potassium 3.5 - 5.1 mmol/L 3.9 3.9 3.3(L)  Chloride 98 - 111 mmol/L 108 110 109  CO2 22 - 32 mmol/L 21(L) 20(L) 20(L)  Calcium 8.9 - 10.3 mg/dL 9.0 8.7(L) 8.8(L)     CBC: CBC Latest Ref Rng & Units 06/30/2021 06/23/2021 06/16/2021  WBC 4.0 - 10.5 K/uL 7.7 3.8(L) 4.6  Hemoglobin 12.0 - 15.0 g/dL 10.1(L) 10.4(L) 9.7(L)  Hematocrit 36.0 - 46.0 % 32.3(L) 32.3(L) 30.6(L)  Platelets 150 - 400 K/uL 303 345 328     CBG: No results for input(s): GLUCAP in the last 168 hours.  Brief HPI:   Michaela Rios is a 70 y.o. female with history of HTN, anxiety/depression, CKD 3, migraines, chronic low back pain who was admitted on 05/24/2021 after a fall with reports of weakness, urinary incontinence and poor p.o. intake.  She was febrile in ED and hypokalemic with CT of head showing  multifocal hyperthermia changes.  MRI brain showed restricted diffusion in right basal ganglia corresponding to left lenticular striate territory and hyperintensity in left caudate, left thalamus and right medial thalamic with vasogenic Edema felt to be unusual packing with question of subacute ischemia, tumor, infection, cerebritis or toxoplasmosis.  Blood cultures were negative, LP showed elevated protein but was negative for CSF oligoclonal bands.  Vasculitis work-up showed patient to be ANA positive, dsDNA negative and she was treated with 3-day course of IV steroids due to concerns of vasculitis.    She underwent cerebral angiogram with Dr. Alonza Smoker on 07/05 which was negative for vasculitis, AVM, stenosis, dural AV fistula or other significant vascular abnormality.  Postprocedure developed large dorsal hand hematoma treated with TR band and no evidence of compartment syndrome noted per Dr. Oneida Alar.  Repeat MRI brain on 07/06 showed numerous small acute bilateral cerebral and cerebellar infarct compatible with emboli and expected evolution of right basal ganglia infarct as well as decreased T2 signal abnormality.  TEE was negative for endocarditis but showed large patent PFO.    Dr. Leonie Man felt that repeat MRI showed punctate infarcts that were likely periprocedural complications and clinically silent.  He did not feel that PFO was because of a stroke and loop recorder recommended to rule out occult A. fib.  Neurology recommended DAPT x3 weeks followed by ASA alone as well as follow-up MRI brain  on outpatient basis.  Her mentation was improving but she continued to have deficits in balance with posterior and right lateral lean, delayed in processing as well as weakness affecting ADLs as well as mobility.  See IR was recommended to functional decline.   Hospital Course: Michaela Rios was admitted to rehab 06/08/2021 for inpatient therapies to consist of PT, ST and OT at least three hours five  days a week. Past admission physiatrist, therapy team and rehab RN have worked together to provide customized collaborative inpatient rehab.  Her blood pressures were monitored on TID basis and have been stable.  Imitrex was discontinued due to recent stroke and Topamax was added and titrated up to 200 mg twice daily.  Hydrocodone was discontinued and her headaches have been managed with use of tizanidine and Fioricet on prn basis    She was noted to have problems with urinary incontinence since schedule tolerating program was initiated.  Constipation has resolved with adjustment of bowel program.  Follow-up CBC done showing slight drop in H&H however serial checks have been relatively stable without any signs of bleeding.  Neutropenia has resolved.  Check of electrolytes showed hypokalemia and kdur was added for supplementation with good results.  Her p.o. intake has been good and mood has been stable.  She has had issues with insomnia therefore Ambien was scheduled with improvement in sleep-wake cycle.  She has made steady gains in supervision is recommended for safety and due to cognitive deficits. She will continue to receive follow up outpatient therapy PT and OT at [facility] neuro rehab after discharge.    Rehab course: During patient's stay in rehab weekly team conferences were held to monitor patient's progress, set goals and discuss barriers to discharge. At admission, patient required mod assist with ADL  tasks and  She presented with moderate cognitive deficits with SLUMS score of 17/30.  She  has had improvement in activity tolerance, balance, postural control as well as ability to compensate for deficits.  She is able to complete ADL tasks with supervision.  She requires supervision for transfers and to ambulate with CGA.  She requires supervision to min assist for cognitive tasks and requires intermittent cues to utilize compensatory memory strategies.  Family education was completed with  husband.  Disposition: Home  Diet: Heart healthy.  Special Instructions: Will need Chest CT repeated in 12 months to follow up on RML nodule Husband to assist with medication  management.  3. No driving or strenuous activity.    Discharge Instructions     Ambulatory referral to Neurology   Complete by: As directed    An appointment is requested in approximately: 2 weeks   Ambulatory referral to Physical Medicine Rehab   Complete by: As directed    3-4 week follow up appt      Allergies as of 07/01/2021       Reactions   Amitriptyline Hcl    Changed personality   Shellfish Allergy Nausea And Vomiting, Other (See Comments)   Tested "high" on allergy test   Amoxicillin Hives   Cephalexin Hives   Lisinopril Cough   .   Penicillins Hives        Medication List     STOP taking these medications    clopidogrel 75 MG tablet Commonly known as: PLAVIX   HYDROcodone-acetaminophen 7.5-325 MG tablet Commonly known as: NORCO   SUMAtriptan 100 MG tablet Commonly known as: IMITREX       TAKE these medications  acetaminophen 650 MG CR tablet Commonly known as: TYLENOL Take 1 tablet (650 mg total) by mouth every 8 (eight) hours as needed for pain. What changed: how much to take   amLODipine 5 MG tablet Commonly known as: NORVASC TAKE ONE TABLET BY MOUTH EVERY MORNING   aspirin 81 MG EC tablet Take 1 tablet (81 mg total) by mouth daily. Swallow whole.   buPROPion 300 MG 24 hr tablet Commonly known as: WELLBUTRIN XL TAKE ONE TABLET BY MOUTH EVERY MORNING   butalbital-acetaminophen-caffeine 50-325-40 MG tablet Commonly known as: FIORICET Take 1 tablet by mouth every 6 (six) hours as needed for headache or migraine.   calcium carbonate 1250 (500 Ca) MG tablet Commonly known as: OS-CAL - dosed in mg of elemental calcium Take 1 tablet (500 mg of elemental calcium total) by mouth daily with breakfast.   cyanocobalamin 1000 MCG tablet Take 1 tablet (1,000 mcg  total) by mouth daily.   EPINEPHrine 0.3 mg/0.3 mL Soaj injection Commonly known as: EpiPen 2-Pak Inject 0.3 mLs (0.3 mg total) into the muscle as needed for anaphylaxis.   hydrALAZINE 25 MG tablet Commonly known as: APRESOLINE TAKE ONE TABLET BY MOUTH EVERY MORNING and TAKE ONE TABLET BY MOUTH EVERYDAY AT BEDTIME   LORazepam 1 MG tablet Commonly known as: ATIVAN Take 1 tablet (1 mg total) by mouth 2 (two) times daily as needed.   metoprolol succinate 100 MG 24 hr tablet Commonly known as: TOPROL-XL TAKE ONE TABLET BY MOUTH EVERY MORNING WITH OR immediately following A meal   multivitamin with minerals Tabs tablet Take 1 tablet by mouth daily.   ondansetron 4 MG tablet Commonly known as: ZOFRAN Take 1 tablet (4 mg total) by mouth every 6 (six) hours as needed for nausea.   pantoprazole 40 MG tablet Commonly known as: PROTONIX Take 1 tablet (40 mg total) by mouth daily.   potassium chloride 10 MEQ tablet Commonly known as: KLOR-CON Take 1 tablet (10 mEq total) by mouth daily.   senna-docusate 8.6-50 MG tablet Commonly known as: Senokot-S Take 2 tablets by mouth daily after supper.   tiZANidine 4 MG tablet Commonly known as: ZANAFLEX TAKE ONE TABLET BY MOUTH twice daily AS NEEDED FOR muscle SPASMS   topiramate 100 MG tablet Commonly known as: TOPAMAX Take 1 tablet (100 mg total) by mouth 2 (two) times daily. What changed:  how much to take when to take this   venlafaxine XR 75 MG 24 hr capsule Commonly known as: EFFEXOR-XR TAKE THREE CAPSULES BY MOUTH EVERY MORNING   VITAMIN D3 PO Take 1 tablet by mouth daily.   zolpidem 10 MG tablet Commonly known as: AMBIEN TAKE ONE TABLET BY MOUTH EVERYDAY AT BEDTIME AS NEEDED FOR SLEEP        Follow-up Information     Kirsteins, Luanna Salk, MD Follow up.   Specialty: Physical Medicine and Rehabilitation Why: office will call you with follow up appointment Contact information: [place]  [place] [address] [phone]         Binnie Rail, MD. Call.   Specialty: Internal Medicine Why: for post hospital follow up Contact information: [place] [address] [phone]         Garvin Fila, MD Follow up.   Specialties: Neurology, Radiology Why: office will call you with follow up appt Contact information: [address] [place] [address] [phone]  Signed: Bary Leriche 07/08/2021, 5:32 PM

## 2021-07-02 ENCOUNTER — Telehealth: Payer: Self-pay

## 2021-07-02 NOTE — Telephone Encounter (Signed)
Transition Care Management Follow-up Telephone Call Date of discharge and from where: 07/01/2021 from [facility] How have you been since you were released from the hospital? Spoke with husband: stated that patient was sleep at the moment, but was still weak. Any questions or concerns? No  Items Reviewed: Did the pt receive and understand the discharge instructions provided? Yes  Medications obtained and verified? Yes  Other? No  Any new allergies since your discharge? No  Dietary orders reviewed? Yes; cardiac diet Do you have support at home? Yes , husband  [place] and Equipment/Supplies: Were home health services ordered? no If so, what is the name of the agency? N/a  Has the agency set up a time to come to the patient's home? no Were any new equipment or medical supplies ordered?  Yes: rolling walker, bedside commode What is the name of the medical supply agency? [employer] Were you able to get the supplies/equipment? yes Do you have any questions related to the use of the equipment or supplies? No  Functional Questionnaire: (I = Independent and D = Dependent) ADLs: D  Bathing/Dressing- D  Meal Prep- D  Eating- I  Maintaining continence- D  Transferring/Ambulation- I  Managing Meds- D  Follow up appointments reviewed:  PCP Hospital f/u appt confirmed? Yes  Scheduled to see Billey Gosling, MD on 07/07/2021 @ 2:20 pm. [facility] f/u appt confirmed? Yes  Scheduled to see Alysia Penna, MD office will call with appt. Are transportation arrangements needed? No  If their condition worsens, is the pt aware to call PCP or go to the Emergency Dept.? Yes Was the patient provided with contact information for the PCP's office or ED? Yes Was to pt encouraged to call back with questions or concerns? Yes

## 2021-07-03 NOTE — Progress Notes (Signed)
Physical Therapy Discharge Summary  Patient Details  Name: Michaela Rios MRN: [id number] Date of Birth: Nov 05, 1951  Today's Date: 07/02/2021       Patient has met 9 of 9 long term goals due to improved activity tolerance, improved balance, improved postural control, increased strength, increased range of motion, ability to compensate for deficits, improved attention, improved awareness, and improved coordination.  Patient to discharge at an ambulatory level Supervision.   Patient's care partner is independent to provide the necessary physical assistance at discharge.  Reasons goals not met: All PT goals met   Recommendation:  Patient will benefit from ongoing skilled PT services in home health setting to continue to advance safe functional mobility, address ongoing impairments in balance, safety, transfers endurance, attention, coordinaiton, and minimize fall risk.  Equipment: Rollator  RW  Reasons for discharge: treatment goals met and discharge from hospital  Patient/family agrees with progress made and goals achieved: Yes  PT Discharge Precautions/Restrictions Precautions Precautions: Fall    Pain   denies Vision/Perception  Vision - Assessment Eye Alignment: Within Functional Limits Ocular Range of Motion: Within Functional Limits Perception Inattention/Neglect: Does not attend to left side of body Spatial Orientation: Mild Praxis Praxis: Impaired Praxis Impairment Details: Motor planning;Initiation  Cognition Overall Cognitive Status: Impaired/Different from baseline Arousal/Alertness: Awake/alert Attention: Sustained Focused Attention: Appears intact Sustained Attention: Impaired Sustained Attention Impairment: Verbal complex Selective Attention: Impaired Selective Attention Impairment: Verbal complex Memory: Impaired Memory Impairment: Decreased recall of new information;Decreased short term memory Decreased Short Term Memory: Verbal basic;Functional  basic Awareness: Impaired Awareness Impairment: Anticipatory impairment Problem Solving: Impaired Problem Solving Impairment: Verbal complex;Functional complex Executive Function: Organizing;Initiating Organizing: Impaired Organizing Impairment: Functional complex;Verbal complex Initiating: Impaired Initiating Impairment: Verbal complex;Functional complex Self Monitoring: Appears intact Safety/Judgment: Appears intact Sensation Sensation Light Touch: Appears Intact Coordination Gross Motor Movements are Fluid and Coordinated: No Coordination and Movement Description: slowed gross motor movements Finger Nose Finger Test: mild dysmetria with decreased speed bilaterally; R>L Heel Shin Test: Decreased speed biltaterally; R>L Motor  Motor Motor: Hemiplegia Motor - Discharge Observations: decreased standing balance, however, much improved from eval. Pt demonstrates decreased speed with tasks especially with fatigue  Mobility Bed Mobility Bed Mobility: Supine to Sit;Sit to Supine Supine to Sit: Independent with assistive device Sit to Supine: Independent with assistive device Transfers Transfers: Sit to Stand;Stand to Sit;Stand Pivot Transfers Sit to Stand: Supervision/Verbal cueing Stand to Sit: Supervision/Verbal cueing Stand Pivot Transfers: Supervision/Verbal cueing Stand Pivot Transfer Details: Verbal cues for gait pattern Locomotion  Gait Ambulation: Yes Gait Assistance: Contact Guard/Touching assist Assistive device: None Gait Assistance Details: Verbal cues for gait pattern;Verbal cues for precautions/safety Gait Gait: Yes Gait Pattern: Impaired Gait Pattern: Shuffle;Decreased step length - right;Decreased step length - left Gait velocity: decreased High Level Ambulation High Level Ambulation: Side stepping;Backwards walking Side Stepping: pt demonstrates decreased step length when stepping towards the left Backwards Walking: pt demonstrates short shuffled steps when  backwards ambulating, mildly improved with verbal cuing Stairs / Additional Locomotion Stairs: Yes Stairs Assistance: Supervision/Verbal cueing Stair Management Technique: One rail Right Height of Stairs: 6 Wheelchair Mobility Wheelchair Mobility: No  Trunk/Postural Assessment  Cervical Assessment Cervical Assessment: Within Functional Limits Thoracic Assessment Thoracic Assessment: Within Functional Limits Lumbar Assessment Lumbar Assessment: Exceptions to [facility] (posterior pelvic tilt) Postural Control Postural Control: Deficits on evaluation (decreased especially with fatigue and SLS)  Balance Balance Balance Assessed: Yes Standardized Balance Assessment Standardized Balance Assessment: Berg Balance Test;Timed Up and Go Test Berg Balance Test Sit to  Stand: Able to stand  independently using hands Standing Unsupported: Able to stand safely 2 minutes Sitting with Back Unsupported but Feet Supported on Floor or Stool: Able to sit safely and securely 2 minutes Stand to Sit: Controls descent by using hands Transfers: Able to transfer safely, minor use of hands Standing Unsupported with Eyes Closed: Able to stand 10 seconds with supervision Standing Ubsupported with Feet Together: Able to place feet together independently and stand for 1 minute with supervision From Standing, Reach Forward with Outstretched Arm: Can reach confidently >25 cm (10") From Standing Position, Pick up Object from Floor: Able to pick up shoe, needs supervision From Standing Position, Turn to Look Behind Over each Shoulder: Looks behind one side only/other side shows less weight shift Turn 360 Degrees: Able to turn 360 degrees safely but slowly Standing Unsupported, Alternately Place Feet on Step/Stool: Able to complete >2 steps/needs minimal assist Standing Unsupported, One Foot in Front: Able to plae foot ahead of the other independently and hold 30 seconds Standing on One Leg: Unable to try or needs assist to  prevent fall Total Score: 40 Timed Up and Go Test TUG: Normal TUG Normal TUG (seconds): 38.12 Static Sitting Balance Static Sitting - Level of Assistance: 6: Modified independent (Device/Increase time) Dynamic Sitting Balance Dynamic Sitting - Level of Assistance: 5: Stand by assistance Static Standing Balance Static Standing - Balance Support: During functional activity;No upper extremity supported Static Standing - Level of Assistance: 5: Stand by assistance Dynamic Standing Balance Dynamic Standing - Balance Support: During functional activity;Bilateral upper extremity supported Dynamic Standing - Level of Assistance: 5: Stand by assistance Extremity Assessment      RLE Assessment RLE Assessment: Within Functional Limits General Strength Comments: 5/5 Strength throughout, except 4+/5 hip flexion LLE Assessment LLE Assessment: Within Functional Limits General Strength Comments: 5/5 Strength throughout, except 4+/5 hip flexion    Lorie Phenix 07/03/2021, 9:55 AM

## 2021-07-06 DIAGNOSIS — I639 Cerebral infarction, unspecified: Secondary | ICD-10-CM | POA: Diagnosis not present

## 2021-07-06 NOTE — Progress Notes (Signed)
Subjective:    Patient ID: Michaela Rios, female    DOB: 1951-06-24, 70 y.o.   MRN: [id number]  HPI The patient is here for follow up from the hospital..  her husband provides most of the history.    Admitted to hospital  6/27 - 7/12 for fall, weakness, urinary incontinence, poor PO intake  [Admitted to rehab inpt   7/12 - 8/4]  In ED febrile 101, K was 2.8, elevated HR.   Given IVF, started on gentamicin for SIRS.  She had right flank pain and infectious work up neg - CXR, Ct renal stone study neg.  Ct showed multifocal hypoattenuations and LP recommended by neuro.  EEG - intermittent generalized slowing in left temporal region - nonspecific.  She had an elevated d-dimer - CTA neg for PE, BLE [place] neg for DVT.  there was an incidental right lung nodule - CT in a year.  MRI showed restricted diffusion in right basal ganglia corrresponding to lentiulostriate territory and hyperintestsity in left head of caudate, left thalamus, right medial thalamus with vasogenic typie edema.      SIRS: Fever w/o known cause, L > R abnormal signal in brain: Extensive w/u for infection negative Was started on broad spectrum abx ID consulted - abx stopped LP showed elevated protein, 3 WBC, cx neg, CSF pattern - oligoclonal bands/paraneoplastic panel pending.   Vasculitis w/u - ANA pos/DSDNA neg.  Received 3 days course of IV steroids for ? Vasculitis.  ? Lymphoma as cause of stroke as well as T2 changes and recommended EBV serology - prior infection.   Cerebral angiogram 7/5 - ruled out vasculiits, vascular occlusion, aneurysm, AVM, dural AV fistula.   Repeat MRI 7/5 - numerous small acute b/l cerebral and cerebellar infarcts c/w emboli and expected evolution of right BG infarct and dec in T2 signal abnormality.  TEE done 7/7 - neg for endocarditis, large patent PFO Dr Leonie Man felt MRI was punctate infarcts likely periprocedural of cerebral angiogram and doubted PFO as cuase  Acute right basal ganglia  infarct, acute bilateral cerebra/cerebellar infarcts c/w embolic CVA: Initially thought to be infectious, which was ruled out Embolic stroke likely related to cerebral angiogram vs cryptogenic  -  PFO unlikely the cause Loop recorder placed Neuro recommneded DAPT x 3 weeks, then ASA alone F/u MRI in a month as outpt Menation improved, still deficits in balance w/ posteior/ right lateral lean, delayed processing afecting ability to carry out ADLs and weakness  RML nodule on CT: Needs repeat in one year  Elevated d-dimer: B/l leg [place] and  CTA neg for clot  Htn: Amlodipine 5 mg qd, hydralazine 25 mg bid, metoprolol 100 mg qd  Depression: Effexor 75 po daily, Wellbutrin 300 mg qd  Anxiety: Lorazepam 1 mg bid prn  Migraines: Topamax 150 mg HS, fioricet prn  imitrex discontinued   Appetite improving slowly.  She has a cane and walker.  She can not go to the bathroom by herself.  Her husband is always with her - he does not feel she is stable and very unsteady.  he is generally weak.  There is no focal weakness.  She tends to lean toward the left side.  She has been sleeping a lot and less active since being home.  Her husband feels she has gotten weaker since rehab.   She does not feel her anxiety or depression are controlled.    Medications and allergies reviewed with patient and updated if appropriate.  Patient  Active Problem List   Diagnosis Date Noted   Embolic stroke of right basal ganglia ([place]) 06/08/2021   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[place]) 06/08/2021   Cerebral thrombosis with cerebral infarction 05/27/2021   Urinary frequency 04/07/2021   Numbness and tingling in right hand 09/08/2020   Rhabdomyolysis 08/18/2020   Nausea & vomiting 08/18/2020   Intractable nausea and vomiting 08/18/2020   Elevated LFTs 08/17/2020   Hypokalemia 08/17/2020   Post herpetic neuralgia 06/30/2020   Urinary urgency 04/06/2020   Left lumbar radiculopathy 06/04/2018   Pain management contract signed  06/03/2018   Encounter for pain management 03/05/2018   Memory difficulties 03/05/2018   Sialadenitis, right parotid 06/24/2017   Insomnia 05/12/2017   Prediabetes 11/08/2016   Vitamin D deficiency 06/08/2009   Depression 01/07/2009   PEPTIC ULCER DISEASE, CHRONIC 01/07/2009   OTHER OBSTRUCTION OF DUODENUM 01/07/2009   HIATAL HERNIA 01/07/2009   Irritable bowel syndrome 01/07/2009   DEGENERATIVE JOINT DISEASE 01/07/2009   ANEMIA, HX OF 01/07/2009   PULMONARY NODULE 06/09/2008   Anxiety 04/01/2008   Essential hypertension 04/01/2008   Allergic rhinitis 04/01/2008   GERD 04/01/2008   LOW BACK PAIN SYNDROME 04/01/2008   Migraines 04/01/2008    Current Outpatient Medications on File Prior to Visit  Medication Sig Dispense Refill   acetaminophen (TYLENOL) 650 MG CR tablet Take 1 tablet (650 mg total) by mouth every 8 (eight) hours as needed for pain.     amLODipine (NORVASC) 5 MG tablet TAKE ONE TABLET BY MOUTH EVERY MORNING 90 tablet 1   aspirin EC 81 MG EC tablet Take 1 tablet (81 mg total) by mouth daily. Swallow whole. 30 tablet 11   buPROPion (WELLBUTRIN XL) 300 MG 24 hr tablet TAKE ONE TABLET BY MOUTH EVERY MORNING 90 tablet 2   butalbital-acetaminophen-caffeine (FIORICET) 50-325-40 MG tablet Take 1 tablet by mouth every 6 (six) hours as needed for headache or migraine. 14 tablet 0   calcium carbonate (OS-CAL - DOSED IN MG OF ELEMENTAL CALCIUM) 1250 (500 Ca) MG tablet Take 1 tablet (500 mg of elemental calcium total) by mouth daily with breakfast.     Cholecalciferol (VITAMIN D3 PO) Take 1 tablet by mouth daily.     EPINEPHrine (EPIPEN 2-PAK) 0.3 mg/0.3 mL IJ SOAJ injection Inject 0.3 mLs (0.3 mg total) into the muscle as needed for anaphylaxis. 1 each 0   hydrALAZINE (APRESOLINE) 25 MG tablet TAKE ONE TABLET BY MOUTH EVERY MORNING and TAKE ONE TABLET BY MOUTH EVERYDAY AT BEDTIME 90 tablet 6   LORazepam (ATIVAN) 1 MG tablet Take 1 tablet (1 mg total) by mouth 2 (two) times daily as  needed. 180 tablet 0   metoprolol succinate (TOPROL-XL) 100 MG 24 hr tablet TAKE ONE TABLET BY MOUTH EVERY MORNING WITH OR immediately following A meal 90 tablet 3   Multiple Vitamin (MULTIVITAMIN WITH MINERALS) TABS tablet Take 1 tablet by mouth daily.     ondansetron (ZOFRAN) 4 MG tablet Take 1 tablet (4 mg total) by mouth every 6 (six) hours as needed for nausea. 20 tablet 0   pantoprazole (PROTONIX) 40 MG tablet Take 1 tablet (40 mg total) by mouth daily.     potassium chloride (KLOR-CON) 10 MEQ tablet Take 1 tablet (10 mEq total) by mouth daily. 30 tablet 0   senna-docusate (SENOKOT-S) 8.6-50 MG tablet Take 2 tablets by mouth daily after supper. 60 tablet 0   tiZANidine (ZANAFLEX) 4 MG tablet TAKE ONE TABLET BY MOUTH twice daily AS NEEDED FOR muscle SPASMS 30  tablet 0   topiramate (TOPAMAX) 100 MG tablet Take 1 tablet (100 mg total) by mouth 2 (two) times daily. 60 tablet 0   venlafaxine XR (EFFEXOR-XR) 75 MG 24 hr capsule TAKE THREE CAPSULES BY MOUTH EVERY MORNING 270 capsule 6   vitamin B-12 1000 MCG tablet Take 1 tablet (1,000 mcg total) by mouth daily.     zolpidem (AMBIEN) 10 MG tablet TAKE ONE TABLET BY MOUTH EVERYDAY AT BEDTIME AS NEEDED FOR SLEEP 30 tablet 1   No current facility-administered medications on file prior to visit.    Past Medical History:  Diagnosis Date   Anemia    Anxiety    Arthritis    Cataract    Chronic migraine    follows with neuro for same   Clotting disorder (HCC)    clot in ovarian vein- hx   Esophagitis    GERD (gastroesophageal reflux disease)    Hypertension    IBS (irritable bowel syndrome)    Low back pain syndrome    MRSA (methicillin resistant Staphylococcus aureus) infection 04/17/12   left torso   Peptic ulcer disease 2003, 12/2012   Peripheral vascular disease ([place])    Vitamin D deficiency     Past Surgical History:  Procedure Laterality Date   ABDOMINAL HYSTERECTOMY     BUBBLE STUDY  06/03/2021   Procedure: BUBBLE STUDY;  Surgeon:  Donato Heinz, MD;  Location: [facility];  Service: Cardiovascular;;   IR ANGIO EXTERNAL CAROTID SEL EXT CAROTID UNI R MOD SED  06/01/2021   IR ANGIO INTRA EXTRACRAN SEL COM CAROTID INNOMINATE UNI L MOD SED  06/01/2021   IR ANGIO INTRA EXTRACRAN SEL INTERNAL CAROTID UNI R MOD SED  06/01/2021   IR ANGIO VERTEBRAL SEL VERTEBRAL UNI R MOD SED  06/01/2021   IR US GUIDE VASC ACCESS RIGHT  06/01/2021   LAPAROSCOPIC BILATERAL SALPINGO OOPHERECTOMY     LOOP RECORDER INSERTION N/A 06/08/2021   Procedure: LOOP RECORDER INSERTION;  Surgeon: Constance Haw, MD;  Location: [place] [facility];  Service: Cardiovascular;  Laterality: N/A;   pud surgery w/duod sticture-plasty and vagotomy  2003   Dr. Marlou Starks   TEE WITHOUT CARDIOVERSION N/A 06/03/2021   Procedure: TRANSESOPHAGEAL ECHOCARDIOGRAM (TEE);  Surgeon: Donato Heinz, MD;  Location: [facility];  Service: Cardiovascular;  Laterality: N/A;   TONSILLECTOMY AND ADENOIDECTOMY     as a child   TUBAL LIGATION  1984    Social History   Socioeconomic History   Marital status: Married    Spouse name: Levada Dy   Number of children: 1   Years of education: Not on file   Highest education level: Not on file  Occupational History   Occupation: retired  Tobacco Use   Smoking status: Never   Smokeless tobacco: Never  Vaping Use   Vaping Use: Never used  Substance and Sexual Activity   Alcohol use: Yes    Comment: rare   Drug use: No   Sexual activity: Yes  Other Topics Concern   Not on file  Social History Narrative   Lives with spouse    Grown son in [place], 2 g kids   Social Determinants of Health   Financial Resource Strain: Not on file  Food Insecurity: No Food Insecurity   Worried About Charity fundraiser in the Last Year: Never true   [place] in the Last Year: Never true  Transportation Needs: No Transportation Needs   Lack of Transportation (Medical): No  Lack of Transportation (Non-Medical): No  Physical Activity:  Inactive   Days of Exercise per Week: 0 days   Minutes of Exercise per Session: 0 min  Stress: Stress Concern Present   Feeling of Stress : Very much  Social Connections: Moderately Isolated   Frequency of Communication with Friends and Family: More than three times a week   Frequency of Social Gatherings with Friends and Family: Never   Attends Religious Services: Never   Marine scientist or Organizations: No   Attends Music therapist: Never   Marital Status: Married    Family History  Problem Relation Age of Onset   Aneurysm Father    Prostate cancer Brother    Lung cancer Brother    Colon cancer Neg Hx    Esophageal cancer Neg Hx    Rectal cancer Neg Hx    Stomach cancer Neg Hx     Review of Systems  Constitutional:  Positive for fatigue. Negative for chills and fever.  HENT:  Negative for trouble swallowing.   Respiratory:  Positive for cough (mild). Negative for shortness of breath and wheezing.   Cardiovascular:  Negative for chest pain, palpitations and leg swelling.  Gastrointestinal:  Negative for abdominal pain, constipation, diarrhea and nausea.       No gerd  Neurological:  Positive for light-headedness and headaches (migraines). Negative for speech difficulty.  Psychiatric/Behavioral:  Positive for confusion (some confusion per husband), dysphoric mood and sleep disturbance (controlled). The patient is nervous/anxious.        Some change in memory      Objective:   Vitals:   07/07/21 1444  BP: 104/60  Pulse: 81  Temp: 98.5 F (36.9 C)  SpO2: 96%   BP Readings from Last 3 Encounters:  07/07/21 104/60  07/01/21 124/75  06/08/21 (!) 142/95   Wt Readings from Last 3 Encounters:  07/07/21 122 lb (55.3 kg)  06/08/21 123 lb 10.9 oz (56.1 kg)  06/03/21 134 lb 0.6 oz (60.8 kg)   Body mass index is 21.61 kg/m.   Physical Exam    Constitutional: Appears well-developed and well-nourished. No distress.  HENT:  Head: Normocephalic and  atraumatic.  Neck: Neck supple. No tracheal deviation present. No thyromegaly present.  No cervical lymphadenopathy Cardiovascular: Normal rate, regular rhythm and normal heart sounds.   No murmur heard. No carotid bruit .  No edema Pulmonary/Chest: Effort normal and breath sounds normal. No respiratory distress. No has no wheezes. No rales.  Abdomen: soft, NT, ND Neuro: generalized weakness - LLE slightly more weak than RLE, no CN deficits Skin: Skin is warm and dry. Not diaphoretic.  Psychiatric: Normal mood and affect. Behavior is normal.   Lab Results  Component Value Date   WBC 7.7 06/30/2021   HGB 10.1 (L) 06/30/2021   HCT 32.3 (L) 06/30/2021   PLT 303 06/30/2021   GLUCOSE 97 06/30/2021   CHOL 96 05/27/2021   TRIG 136 05/27/2021   HDL 24 (L) 05/27/2021   LDLCALC 45 05/27/2021   ALT 18 06/12/2021   AST 27 06/12/2021   NA 138 06/30/2021   K 3.9 06/30/2021   CL 108 06/30/2021   CREATININE 0.91 06/30/2021   BUN 15 06/30/2021   CO2 21 (L) 06/30/2021   TSH 1.904 05/26/2021   INR 1.1 05/24/2021   HGBA1C 5.9 (H) 05/27/2021    DG Chest 2 View CLINICAL DATA:  Rales in the right chest on physical examination.  EXAM: CHEST - 2 VIEW  COMPARISON:  PA and lateral chest 05/24/2021.  FINDINGS: The lungs are clear. Heart size is normal. Loop recorder noted. No pneumothorax or pleural fluid. No acute or focal bony abnormality.  IMPRESSION: Negative chest.  Electronically Signed   By: Inge Rise M.D.   On: 06/14/2021 15:01    Assessment & Plan:    See Problem List for Assessment and Plan of chronic medical problems.    This visit occurred during the SARS-CoV-2 public health emergency.  Safety protocols were in place, including screening questions prior to the visit, additional usage of staff PPE, and extensive cleaning of exam room while observing appropriate contact time as indicated for disinfecting solutions.

## 2021-07-07 ENCOUNTER — Other Ambulatory Visit: Payer: Self-pay

## 2021-07-07 ENCOUNTER — Encounter: Payer: Self-pay | Admitting: Internal Medicine

## 2021-07-07 ENCOUNTER — Ambulatory Visit (INDEPENDENT_AMBULATORY_CARE_PROVIDER_SITE_OTHER): Payer: Medicare HMO | Admitting: Internal Medicine

## 2021-07-07 DIAGNOSIS — Z8673 Personal history of transient ischemic attack (TIA), and cerebral infarction without residual deficits: Secondary | ICD-10-CM | POA: Diagnosis not present

## 2021-07-07 DIAGNOSIS — I10 Essential (primary) hypertension: Secondary | ICD-10-CM | POA: Diagnosis not present

## 2021-07-07 DIAGNOSIS — G43909 Migraine, unspecified, not intractable, without status migrainosus: Secondary | ICD-10-CM

## 2021-07-07 DIAGNOSIS — I639 Cerebral infarction, unspecified: Secondary | ICD-10-CM

## 2021-07-07 DIAGNOSIS — I6381 Other cerebral infarction due to occlusion or stenosis of small artery: Secondary | ICD-10-CM

## 2021-07-07 DIAGNOSIS — F419 Anxiety disorder, unspecified: Secondary | ICD-10-CM | POA: Diagnosis not present

## 2021-07-07 DIAGNOSIS — K219 Gastro-esophageal reflux disease without esophagitis: Secondary | ICD-10-CM

## 2021-07-07 DIAGNOSIS — G4709 Other insomnia: Secondary | ICD-10-CM | POA: Diagnosis not present

## 2021-07-07 DIAGNOSIS — R69 Illness, unspecified: Secondary | ICD-10-CM | POA: Diagnosis not present

## 2021-07-07 DIAGNOSIS — F3289 Other specified depressive episodes: Secondary | ICD-10-CM

## 2021-07-07 NOTE — Assessment & Plan Note (Addendum)
Chronic Has tried several medications in the past Has seen neuro in the past but no longer following with neuro Continue topamax 10 mg bid fioricet prn Tizanidine 4 mg daily prn

## 2021-07-07 NOTE — Assessment & Plan Note (Signed)
Chronic BP on low side here today, but has not been this low previously Monitor bp but will not make any changes today Continue amlodipine 5 mg qd, hydralazine 25 mg bid, metoprolol xl 100 mg qd

## 2021-07-07 NOTE — Assessment & Plan Note (Signed)
Chronic Controlled Try decreasing pantoprazole to 40 mg QOD

## 2021-07-07 NOTE — Assessment & Plan Note (Signed)
Chronic ? Controlled or not - it is hard to say at this time continue effexor 75 mg qd

## 2021-07-07 NOTE — Assessment & Plan Note (Signed)
Chronic ? Truly controlled I do not want to make any changes today - she does not seem anxious continue effexor 75 mg qd and ativan 1 mg bid

## 2021-07-07 NOTE — Assessment & Plan Note (Signed)
Acute With generalized weakness and fatigued/mental slowing Likely slightly more weak due to being more sedentary and sleeping more - advise to increase activity safely - to start outpt PT this month Needs to f/u with neuro - they did get a call from them but did not call them back - stressed importance of making f/u appt - name and number given for Dr Leonie Man

## 2021-07-07 NOTE — Assessment & Plan Note (Signed)
Chronic On ambien 10 mg daily - ideally would like to decrease this dose to 5 mg  But she is very resistant - will address at a future visit

## 2021-07-07 NOTE — Patient Instructions (Addendum)
   Medications changes include :   try decreasing the pantoprazole to every other day 0 this is for reflux.     You can hold off on seeing the kidney doctor for now.    Try to start to increase activity in a safe way at home.     Schedule an appointment with neurology.  Dr Leonie Man.   [facility] [address], [place], [place] [address] [phone]   Please followup in 3 months

## 2021-07-09 ENCOUNTER — Encounter: Payer: Self-pay | Admitting: Internal Medicine

## 2021-07-09 DIAGNOSIS — R9389 Abnormal findings on diagnostic imaging of other specified body structures: Secondary | ICD-10-CM

## 2021-07-09 DIAGNOSIS — I6381 Other cerebral infarction due to occlusion or stenosis of small artery: Secondary | ICD-10-CM

## 2021-07-09 DIAGNOSIS — I639 Cerebral infarction, unspecified: Secondary | ICD-10-CM

## 2021-07-17 ENCOUNTER — Other Ambulatory Visit: Payer: Self-pay | Admitting: Internal Medicine

## 2021-07-19 ENCOUNTER — Ambulatory Visit: Payer: Medicare HMO | Admitting: Physical Therapy

## 2021-07-19 ENCOUNTER — Encounter: Payer: Self-pay | Admitting: Internal Medicine

## 2021-07-19 ENCOUNTER — Ambulatory Visit: Payer: Medicare HMO | Admitting: Occupational Therapy

## 2021-07-20 ENCOUNTER — Telehealth: Payer: Self-pay | Admitting: Pharmacist

## 2021-07-20 NOTE — Progress Notes (Signed)
Chronic Care Management Pharmacy Assistant   Name: Michaela Rios  MRN: [id number] DOB: 27-Feb-1951  Medications: Outpatient Encounter Medications as of 07/20/2021  Medication Sig   acetaminophen (TYLENOL) 650 MG CR tablet Take 1 tablet (650 mg total) by mouth every 8 (eight) hours as needed for pain.   amLODipine (NORVASC) 5 MG tablet TAKE ONE TABLET BY MOUTH EVERY MORNING   aspirin EC 81 MG EC tablet Take 1 tablet (81 mg total) by mouth daily. Swallow whole.   buPROPion (WELLBUTRIN XL) 300 MG 24 hr tablet TAKE ONE TABLET BY MOUTH EVERY MORNING   butalbital-acetaminophen-caffeine (FIORICET) 50-325-40 MG tablet Take 1 tablet by mouth every 6 (six) hours as needed for headache or migraine.   calcium carbonate (OS-CAL - DOSED IN MG OF ELEMENTAL CALCIUM) 1250 (500 Ca) MG tablet Take 1 tablet (500 mg of elemental calcium total) by mouth daily with breakfast.   Cholecalciferol (VITAMIN D3 PO) Take 1 tablet by mouth daily.   EPINEPHrine (EPIPEN 2-PAK) 0.3 mg/0.3 mL IJ SOAJ injection Inject 0.3 mLs (0.3 mg total) into the muscle as needed for anaphylaxis.   hydrALAZINE (APRESOLINE) 25 MG tablet TAKE ONE TABLET BY MOUTH EVERY MORNING and TAKE ONE TABLET BY MOUTH EVERYDAY AT BEDTIME   LORazepam (ATIVAN) 1 MG tablet Take 1 tablet (1 mg total) by mouth 2 (two) times daily as needed.   metoprolol succinate (TOPROL-XL) 100 MG 24 hr tablet TAKE ONE TABLET BY MOUTH EVERY MORNING WITH OR immediately following A meal   Multiple Vitamin (MULTIVITAMIN WITH MINERALS) TABS tablet Take 1 tablet by mouth daily.   ondansetron (ZOFRAN) 4 MG tablet Take 1 tablet (4 mg total) by mouth every 6 (six) hours as needed for nausea.   pantoprazole (PROTONIX) 40 MG tablet Take 1 tablet (40 mg total) by mouth daily.   potassium chloride (KLOR-CON) 10 MEQ tablet Take 1 tablet (10 mEq total) by mouth daily.   senna-docusate (SENOKOT-S) 8.6-50 MG tablet Take 2 tablets by mouth daily after supper.   tiZANidine (ZANAFLEX) 4 MG  tablet TAKE ONE TABLET BY MOUTH twice daily AS NEEDED FOR muscle SPASMS   topiramate (TOPAMAX) 100 MG tablet Take 1 tablet (100 mg total) by mouth 2 (two) times daily.   venlafaxine XR (EFFEXOR-XR) 75 MG 24 hr capsule TAKE THREE CAPSULES BY MOUTH EVERY MORNING   vitamin B-12 1000 MCG tablet Take 1 tablet (1,000 mcg total) by mouth daily.   zolpidem (AMBIEN) 10 MG tablet TAKE ONE TABLET BY MOUTH EVERYDAY AT BEDTIME AS NEEDED FOR SLEEP   No facility-administered encounter medications on file as of 07/20/2021.   Reviewed chart for medication changes ahead of medication coordination call.  No OVs, Consults, or hospital visits since last care coordination call/Pharmacist visit. (If appropriate, list visit date, provider name)  No medication changes indicated OR if recent visit, treatment plan here.  BP Readings from Last 3 Encounters:  07/07/21 104/60  07/01/21 124/75  06/08/21 (!) 142/95    Lab Results  Component Value Date   HGBA1C 5.9 (H) 05/27/2021     Patient obtains medications through Adherence Packaging  90 Days   Last adherence delivery included:  Sumatriptan 100 mg  Zolpidem 10 mg  Patient is due for next adherence delivery on: 07/30/21. Called patient and reviewed medications and coordinated delivery.  This delivery to include: Potassium chloride 10 meq Topiramate 100 mg Amlodipine 5 mg Butalbital-acetaminophen-caffeine 50-325-40 mg Venlafaxine Er 75 mg Bupropion XL 300 mg Furosemide 20 mg Metoprolol Er 100  mg Hydralazine 25 mg  Patient needs refills for: Potassium chloride 10 meq Topiramate 100 mg Amlodipine 5 mg Butalbital-acetaminophen-caffeine 50-325-40 mg  Confirmed delivery date of 07/30/21, advised patient that pharmacy will contact them the morning of delivery.   Star Rating Drugs: None noted  [name], RMA Clinical Pharmacists Assistant [phone]  Time Spent: [phone]

## 2021-07-21 ENCOUNTER — Ambulatory Visit (INDEPENDENT_AMBULATORY_CARE_PROVIDER_SITE_OTHER): Payer: Medicare HMO

## 2021-07-21 DIAGNOSIS — I6330 Cerebral infarction due to thrombosis of unspecified cerebral artery: Secondary | ICD-10-CM

## 2021-07-22 ENCOUNTER — Other Ambulatory Visit: Payer: Self-pay | Admitting: Physical Medicine and Rehabilitation

## 2021-07-22 LAB — CUP PACEART REMOTE DEVICE CHECK
Date Time Interrogation Session: 20220824155026
Implantable Pulse Generator Implant Date: 20220712

## 2021-07-23 ENCOUNTER — Other Ambulatory Visit: Payer: Self-pay | Admitting: Internal Medicine

## 2021-07-23 ENCOUNTER — Other Ambulatory Visit: Payer: Self-pay | Admitting: Physical Medicine and Rehabilitation

## 2021-07-27 ENCOUNTER — Other Ambulatory Visit: Payer: Self-pay | Admitting: Internal Medicine

## 2021-07-29 ENCOUNTER — Other Ambulatory Visit: Payer: Self-pay | Admitting: Physical Medicine and Rehabilitation

## 2021-07-30 ENCOUNTER — Ambulatory Visit: Payer: Medicare HMO | Attending: Physician Assistant

## 2021-07-30 ENCOUNTER — Other Ambulatory Visit: Payer: Self-pay

## 2021-07-30 ENCOUNTER — Ambulatory Visit: Payer: Medicare HMO | Admitting: Occupational Therapy

## 2021-07-30 DIAGNOSIS — R2689 Other abnormalities of gait and mobility: Secondary | ICD-10-CM | POA: Insufficient documentation

## 2021-07-30 DIAGNOSIS — I69354 Hemiplegia and hemiparesis following cerebral infarction affecting left non-dominant side: Secondary | ICD-10-CM

## 2021-07-30 DIAGNOSIS — R278 Other lack of coordination: Secondary | ICD-10-CM | POA: Insufficient documentation

## 2021-07-30 DIAGNOSIS — R41840 Attention and concentration deficit: Secondary | ICD-10-CM

## 2021-07-30 DIAGNOSIS — R41844 Frontal lobe and executive function deficit: Secondary | ICD-10-CM

## 2021-07-30 DIAGNOSIS — M6281 Muscle weakness (generalized): Secondary | ICD-10-CM | POA: Insufficient documentation

## 2021-07-30 NOTE — Addendum Note (Signed)
Addended by: Kerrie Pleasure on: 07/30/2021 12:28 PM   Modules accepted: Orders

## 2021-07-30 NOTE — Therapy (Signed)
[facility] [address] [place], [place], [address] Phone: [phone]   Fax:  [phone]  Occupational Therapy Evaluation  Patient Details  Name: Michaela Rios MRN: [id number] Date of Birth: Apr 11, 1951 Referring Provider (OT): [place] f/u [place]   Encounter Date: 07/30/2021   OT End of Session - 07/30/21 1419     Visit Number 1    Number of Visits 25    Date for OT Re-Evaluation 10/22/21    Authorization Type [employer]    Authorization Time Period $35 copay per day  VL:MN    OT Start Time 1105    OT Stop Time 1145    OT Time Calculation (min) 40 min    Activity Tolerance Patient tolerated treatment well    Behavior During Therapy WFL for tasks assessed/performed             Past Medical History:  Diagnosis Date   Anemia    Anxiety    Arthritis    Cataract    Chronic migraine    follows with neuro for same   Clotting disorder (HCC)    clot in ovarian vein- hx   Esophagitis    GERD (gastroesophageal reflux disease)    Hypertension    IBS (irritable bowel syndrome)    Low back pain syndrome    MRSA (methicillin resistant Staphylococcus aureus) infection 04/17/12   left torso   Peptic ulcer disease 2003, 12/2012   Peripheral vascular disease ([place])    Vitamin D deficiency     Past Surgical History:  Procedure Laterality Date   ABDOMINAL HYSTERECTOMY     BUBBLE STUDY  06/03/2021   Procedure: BUBBLE STUDY;  Surgeon: Donato Heinz, MD;  Location: [facility];  Service: Cardiovascular;;   IR ANGIO EXTERNAL CAROTID SEL EXT CAROTID UNI R MOD SED  06/01/2021   IR ANGIO INTRA EXTRACRAN SEL COM CAROTID INNOMINATE UNI L MOD SED  06/01/2021   IR ANGIO INTRA EXTRACRAN SEL INTERNAL CAROTID UNI R MOD SED  06/01/2021   IR ANGIO VERTEBRAL SEL VERTEBRAL UNI R MOD SED  06/01/2021   IR US GUIDE VASC ACCESS RIGHT  06/01/2021   LAPAROSCOPIC BILATERAL SALPINGO OOPHERECTOMY     LOOP RECORDER INSERTION N/A 06/08/2021    Procedure: LOOP RECORDER INSERTION;  Surgeon: Constance Haw, MD;  Location: [facility];  Service: Cardiovascular;  Laterality: N/A;   pud surgery w/duod sticture-plasty and vagotomy  2003   Dr. Marlou Starks   TEE WITHOUT CARDIOVERSION N/A 06/03/2021   Procedure: TRANSESOPHAGEAL ECHOCARDIOGRAM (TEE);  Surgeon: Donato Heinz, MD;  Location: [facility];  Service: Cardiovascular;  Laterality: N/A;   TONSILLECTOMY AND ADENOIDECTOMY     as a child   TUBAL LIGATION  1984    There were no vitals filed for this visit.   Subjective Assessment - 07/30/21 1059     Subjective  Pt is a 70 year old female that presents to Neuro OPOT s/p right basal ganglia stroke on 05/24/21. PMH significant for HTN, CKD 3, migraines, chronic LBP, prediabetes and anxiety, depression. Pt reports getting home on 07/01/21 and has since had a significant decline in function. Pt was ambulating without AD when she got home and is now req'd min A - CGA for ambulation. Pt reports having shingles that lead up to the CVA. Pt lives with spouse in a one story home.    Pertinent History PMH significant for HTN, CKD 3, migraines, chronic LBP, prediabetes and anxiety, depression.  Limitations Fall Risk    Patient Stated Goals "to be like I was before"    Currently in Pain? No/denies    Pain Score 0-No pain               [facility] OT Assessment - 07/30/21 1101       Assessment   Medical Diagnosis Cerebral Infarction (R)    Referring Provider (OT) [place] f/u [place]    Onset Date/Surgical Date 05/24/21    Hand Dominance Right      Precautions   Precautions Fall;Other (comment)    Precaution Comments loop recorder      Balance Screen   Has the patient fallen in the past 6 months Yes   PT seeing patient     Home  Environment   Family/patient expects to be discharged to: Private residence    Living Arrangements Spouse/significant other    Available Help at Discharge Family    Type of [place] One level    Bathroom Shower/Tub [place] Accessibility Yes   rollator will not fit - amublates in with spouse and quad cane   Chief Financial Officer seat - built in;Cane -Set designer - 4 wheels;Grab bars - tub/shower      Prior Function   Level of Independence Independent    Vocation Retired    Leisure read      ADL   Eating/Feeding Modified independent    Grooming Modified independent    Upper Body Bathing Minimal assistance    Lower Body Bathing Minimal assistance    Upper Body Dressing Independent    Lower Body Dressing Modified independent    Toilet Transfer Minimal assistance   needs help getting up off toilet   Toileting - [place] independent    [place] Minimal assistance   needs help washing thoroghly after every BM   Tub/Shower Transfer Supervision/safety      IADL   Prior Level of Function Shopping independent    Shopping Completely unable to shop    Prior Level of Function Light Housekeeping laundry and cleaning before    [employer] Does not participate in any housekeeping tasks    Prior Level of Function Meal Prep independent    Meal Prep Does not utilize stove or oven;Needs to have meals prepared and served    Prior Level of Energy manager Relies on family or friends for transportation    Prior Level of Function Medication Managment independent    Medication Management Is responsible for taking medication in correct dosages at correct time;Takes responsibility if medication is prepared in advance in [place] dosage   uses prepackaed [place] ([employer])   Prior Level of Function Financial Management independent    Financial Management Requires supervision/minimal cuing;Requires assistance;Dependent      Written Expression   Dominant Hand Right      Vision - History   Baseline Vision Wears contact   L eye only     Cognition   Overall  Cognitive Status Impaired/Different from baseline    Area of Impairment Memory;Following commands;Problem solving      Observation/Other Assessments   Focus on Therapeutic Outcomes (FOTO)  55%   predicted 66%     Sensation   Light Touch Appears Intact    Hot/Cold Appears Intact      Coordination   Finger Nose Finger Test slower and uncoordinated L>R  9 Hole Peg Test Right;Left    Right 9 Hole Peg Test 52.28s    Left 9 Hole Peg Test 59.06s    Box and Blocks R 20, L 20      Praxis   Praxis Impaired      ROM / Strength   AROM / PROM / Strength AROM;Strength      AROM   Overall AROM  Within functional limits for tasks performed      Strength   Overall Strength Other (comment)    Overall Strength Comments did not assess d/t time      Hand Function   Right Hand Grip (lbs) 30.6    Left Hand Grip (lbs) 31.3                               OT Short Term Goals - 07/30/21 1423       OT SHORT TERM GOAL #1   Title Pt will be independent with HEP    Time 4    Period Weeks    Status New    Target Date 08/27/21      OT SHORT TERM GOAL #2   Title Pt will increase coordination in BUE by completing 9 hole peg test in 5 seconds less than eval.    Baseline R 52.28s, L 59.06s    Time 4    Period Weeks    Status New      OT SHORT TERM GOAL #3   Title Pt will demonstrate improved sustained attention by completing a novel cognitive task in appropriate time.    Time 4    Period Weeks    Status New      OT SHORT TERM GOAL #4   Title Pt will perform simple warm meal prep and/or light housekeeping task with supervision and good safety awareness    Time 4    Period Weeks    Status New               OT Long Term Goals - 07/30/21 1426       OT LONG TERM GOAL #1   Title Pt will be independent with updated HEP    Time 12    Period Weeks    Status New    Target Date 10/22/21      OT LONG TERM GOAL #2   Title Pt will increase grip strength,  bilaterally, by 5 lbs or greater.    Baseline R 30.6, L 31.3    Time 12    Period Weeks    Status New      OT LONG TERM GOAL #3   Title Pt will improve 9 hole peg test score by 10 seconds or greater.    Baseline R 52.28s, L 59.06s    Time 12    Period Weeks    Status New      OT LONG TERM GOAL #4   Title Pt will complete Michaela alternating attention task with 90% accuracy or greater.    Time 12    Period Weeks    Status New      OT LONG TERM GOAL #5   Title Pt will perform simple warm meal prep and/or light housekeeping with mod I and good safety awareness.    Time 12    Period Weeks    Status New      Long Term Additional Goals   Additional Long Term  Goals Yes      OT LONG TERM GOAL #6   Title Pt will complete FOTO at discharge with score of 66% or greater.    Baseline 55% at eval    Time 12    Period Weeks    Status New                   Plan - 07/30/21 1100     Clinical Impression Statement Pt is a 70 year old female that presents to Neuro OPOT s/p right basal ganglia stroke on 05/24/21. PMH significant for HTN, CKD 3, migraines, chronic LBP, prediabetes and anxiety, depression. Pt presents with slower processing and decreased speed with [place] and FMC, poor grip strength, bilaterally, and decreased attention and deficits with memory, and unsteadiness on feet. Skilled occupational therapy is recommended to target listed arease of deficit and increase independence upon discharge and decrease caregiver burden.    OT Occupational Profile and History Problem Focused Assessment - Including review of records relating to presenting problem    Occupational performance deficits (Please refer to evaluation for details): IADL's;ADL's;Leisure    Body Structure / Function / Physical Skills ADL;Coordination;FMC;Mobility;IADL;ROM;Proprioception;UE functional use;Dexterity;GMC;Strength;Decreased knowledge of use of DME    Rehab Potential Good    Clinical Decision Making Limited  treatment options, no task modification necessary    Comorbidities Affecting Occupational Performance: None    Modification or Assistance to Complete Evaluation  No modification of tasks or assist necessary to complete eval    OT Frequency 2x / week    OT Duration 12 weeks    OT Treatment/Interventions Self-care/ADL training;Manual Therapy;Energy conservation;Neuromuscular education;Therapist, nutritional;Therapeutic exercise;Therapeutic activities;DME and/or AE instruction;Moist Heat;Fluidtherapy;Patient/family education    Plan HEP coordination BUE, grip strength/putty HEP, review goals.    Consulted and Agree with Plan of Care Patient;Family member/caregiver    Family Member Consulted spouse, Levada Dy             Patient will benefit from skilled therapeutic intervention in order to improve the following deficits and impairments:   Body Structure / Function / Physical Skills: ADL, Coordination, FMC, Mobility, IADL, ROM, Proprioception, UE functional use, Dexterity, GMC, Strength, Decreased knowledge of use of DME       Visit Diagnosis: Muscle weakness (generalized) - Plan: Ot plan of care cert/re-cert  Hemiplegia and hemiparesis following cerebral infarction affecting left non-dominant side ([place]) - Plan: Ot plan of care cert/re-cert  Other lack of coordination - Plan: Ot plan of care cert/re-cert  Attention and concentration deficit - Plan: Ot plan of care cert/re-cert  Frontal lobe and executive function deficit - Plan: Ot plan of care cert/re-cert    Problem List Patient Active Problem List   Diagnosis Date Noted   Embolic stroke of right basal ganglia ([place]) 06/08/2021   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[place]) 06/08/2021   Cerebral thrombosis with cerebral infarction 05/27/2021   Numbness and tingling in right hand 09/08/2020   Rhabdomyolysis 08/18/2020   Hypokalemia 08/17/2020   Post herpetic neuralgia 06/30/2020   Left lumbar radiculopathy 06/04/2018   Memory difficulties  03/05/2018   Sialadenitis, right parotid 06/24/2017   Insomnia 05/12/2017   Prediabetes 11/08/2016   Vitamin D deficiency 06/08/2009   Depression 01/07/2009   PEPTIC ULCER DISEASE, CHRONIC 01/07/2009   OTHER OBSTRUCTION OF DUODENUM 01/07/2009   HIATAL HERNIA 01/07/2009   Irritable bowel syndrome 01/07/2009   DEGENERATIVE JOINT DISEASE 01/07/2009   ANEMIA, HX OF 01/07/2009   Right middle lobe pulmonary nodule 06/09/2008   Anxiety 04/01/2008  Essential hypertension 04/01/2008   Allergic rhinitis 04/01/2008   GERD 04/01/2008   LOW BACK PAIN SYNDROME 04/01/2008   Migraines 04/01/2008    Zachery Conch MOT, OTR/L  07/30/2021, 2:31 PM  [facility] [address] [place], [place], [address] Phone: [phone]   Fax:  [phone]  Name: Michaela Rios MRN: [id number] Date of Birth: [birth year]

## 2021-07-30 NOTE — Therapy (Addendum)
[facility] [address] [place], [place], [address] Phone: [phone]   Fax:  [phone]  Physical Therapy Evaluation  Patient Details  Name: Michaela Rios MRN: [id number] Date of Birth: 1951-03-02 Referring Provider (PT): Lauraine Rinne, PA-C   Encounter Date: 07/30/2021   PT End of Session - 07/30/21 1222     Visit Number 1    Number of Visits 17    Date for PT Re-Evaluation 10/08/21    Progress Note Due on Visit 10    PT Start Time 1020    PT Stop Time 1105    PT Time Calculation (min) 45 min    Equipment Utilized During Treatment Gait belt    Activity Tolerance Patient tolerated treatment well    Behavior During Therapy WFL for tasks assessed/performed             Past Medical History:  Diagnosis Date   Anemia    Anxiety    Arthritis    Cataract    Chronic migraine    follows with neuro for same   Clotting disorder (HCC)    clot in ovarian vein- hx   Esophagitis    GERD (gastroesophageal reflux disease)    Hypertension    IBS (irritable bowel syndrome)    Low back pain syndrome    MRSA (methicillin resistant Staphylococcus aureus) infection 04/17/12   left torso   Peptic ulcer disease 2003, 12/2012   Peripheral vascular disease ([place])    Vitamin D deficiency     Past Surgical History:  Procedure Laterality Date   ABDOMINAL HYSTERECTOMY     BUBBLE STUDY  06/03/2021   Procedure: BUBBLE STUDY;  Surgeon: Donato Heinz, MD;  Location: [facility];  Service: Cardiovascular;;   IR ANGIO EXTERNAL CAROTID SEL EXT CAROTID UNI R MOD SED  06/01/2021   IR ANGIO INTRA EXTRACRAN SEL COM CAROTID INNOMINATE UNI L MOD SED  06/01/2021   IR ANGIO INTRA EXTRACRAN SEL INTERNAL CAROTID UNI R MOD SED  06/01/2021   IR ANGIO VERTEBRAL SEL VERTEBRAL UNI R MOD SED  06/01/2021   IR US GUIDE VASC ACCESS RIGHT  06/01/2021   LAPAROSCOPIC BILATERAL SALPINGO OOPHERECTOMY     LOOP RECORDER INSERTION N/A 06/08/2021    Procedure: LOOP RECORDER INSERTION;  Surgeon: Constance Haw, MD;  Location: [facility];  Service: Cardiovascular;  Laterality: N/A;   pud surgery w/duod sticture-plasty and vagotomy  2003   Dr. Marlou Starks   TEE WITHOUT CARDIOVERSION N/A 06/03/2021   Procedure: TRANSESOPHAGEAL ECHOCARDIOGRAM (TEE);  Surgeon: Donato Heinz, MD;  Location: [facility];  Service: Cardiovascular;  Laterality: N/A;   TONSILLECTOMY AND ADENOIDECTOMY     as a child   TUBAL LIGATION  1984    There were no vitals filed for this visit.    Subjective Assessment - 07/30/21 1033     Subjective acute and subacute rehab from 05/24/21 to 07/01/21 due to stroke. Husband reports that when she came home she was able to walk better for the first week but then he realized that left side was getting weaker and she has fallen 3 times due to left leg giving out.    Patient is accompained by: Family member   Husband   Pertinent History CVA, multiple falls    Limitations Standing;Walking;House hold activities;Sitting    How long can you sit comfortably? unlimited in supported sitting; 5 min in unsupported sitting as pt starts leaning to her R and posterior  How long can you stand comfortably? <1 min    How long can you walk comfortably? 30 feet    Patient Stated Goals get stronger    Currently in Pain? No/denies                [facility] PT Assessment - 07/30/21 1051       Assessment   Medical Diagnosis CVA    Referring Provider (PT) Lauraine Rinne, PA-C    Onset Date/Surgical Date 05/24/21      Precautions   Precautions Fall      Balance Screen   Has the patient fallen in the past 6 months Yes    How many times? 4    Has the patient had a decrease in activity level because of a fear of falling?  No    Is the patient reluctant to leave their home because of a fear of falling?  No      Home Environment   Living Environment Private residence    Living Arrangements Spouse/significant other    Type  of [place] to enter    Entrance Stairs-Number of Steps 2    [place] One level    [place] - 4 wheels;Cane - quad;Shower seat;Grab bars - tub/shower      Prior Function   Level of Independence Independent    Vocation Retired      Charity fundraiser Status Impaired/Different from baseline      Transfers   Transfers Sit to [employer] to [employer] to Stand 6: Modified independent (Device/Increase time)    Stand to Sit 6: Modified independent (Device/Increase time)    Comments posterior sway noted      Ambulation/Gait   Ambulation/Gait Yes    Ambulation/Gait Assistance 6: Modified independent (Device/Increase time)    Ambulation Distance (Feet) 30 Feet    Assistive device Small based quad cane    Gait Pattern Decreased arm swing - right;Decreased arm swing - left;Decreased step length - right;Decreased step length - left;Decreased stance time - right;Decreased stance time - left;Decreased stride length;Decreased dorsiflexion - right;Decreased dorsiflexion - left;Decreased weight shift to right;Decreased weight shift to left;Antalgic;Trunk flexed;Poor foot clearance - left;Poor foot clearance - right      Standardized Balance Assessment   Standardized Balance Assessment Timed Up and Go Test;Five Times Sit to Stand    Five times sit to stand comments  1 min 12 seconds, with bil HHA from mat table, pt required min A for last rep due to fatigue      Timed Up and Go Test   Normal TUG (seconds) 62   quad cane                       Objective measurements completed on examination: See above findings.               PT Education - 07/30/21 1224     Education Details Educated on using rollator for in home and community mobility to improve safety. Since they cannot fit rollator in bathroom, they can use quad cane but husband should be present next to patient.    Person(s) Educated  Patient;Spouse    Methods Explanation    Comprehension Verbalized understanding              PT Short Term Goals - 07/30/21 1208  PT SHORT TERM GOAL #1   Title Pt will be able to ambulate with rollator or standard walker for 115' with SBA to improve in home mobility    Baseline quad cane 30 feet (07/30/21)    Time 4    Period Weeks    Status New    Target Date 08/27/21      PT SHORT TERM GOAL #2   Title Pt will be able to perform 5x sit to stand with bil HHA in <1 min to improve functional strength    Baseline 1 min 12 sec but only able to complete 4 reps I and required min A for 5th rep (07/30/21)    Time 4    Period Weeks    Status New    Target Date 08/27/21      PT SHORT TERM GOAL #3   Title Pt will be able to go up and down 4 steps with use of quad cane and min A to safely get in and out of their home.    Baseline Husband helps her (07/30/21)    Time 4    Period Weeks    Status New    Target Date 08/27/21               PT Long Term Goals - 07/30/21 1214       PT LONG TERM GOAL #1   Title Patient will be able to perform 5x sit to stand in under 30 seconds to improve functional strength    Baseline 1 min 12 sec (07/30/21)    Time 10    Period Weeks    Status New    Target Date 10/08/21      PT LONG TERM GOAL #2   Title Pt will be able to perform TUG in under 30 sec with LRAD to improve functional mobility    Baseline 62 sec with quad cane (07/30/21)    Time 10    Period Weeks    Status New    Target Date 10/08/21      PT LONG TERM GOAL #3   Title Pt will be able to ambulate 400 feet with LRAD to improve community ambulation    Baseline 30 feet with quad cane (07/30/21)    Time 10    Period Weeks    Status New    Target Date 10/08/21      PT LONG TERM GOAL #4   Title Pt will demo 6 points improvement from baseline on BBS to improve functional balance and reduce fall risk    Baseline TBD    Time 10    Period Weeks    Status New    Target Date  10/08/21                    Plan - 07/30/21 1218     Clinical Impression Statement Patient is a 70 y.o. female who was seen today for physical therapy evaluation and treatment for generalized weakness, decreased balance and gait and mobility disorder due to recent CVA. Patient demonstrates significantly decreased functional strength (5x sit to stand test), decreased mobility (Timed up and go test), decreaased walking endurance, decreased safety awareess and is at high risk for fall. patient reports of 3 falls since discharge at home. Patient will benefit from skilled PT to address above impairments, improve function and independence and reduce fall risk.    Personal Factors and Comorbidities Age;Comorbidity 3+;Past/Current Experience;Time since onset of injury/illness/exacerbation  Comorbidities HTN, prediabetes, anxiety and depression, CKD stage 3, remote hx of PUD, chronic low back pain, migraines    Examination-Activity Limitations Bathing;Carry;Caring for Others;Bend;Bed Mobility;Lift;Squat;Stairs;Stand;Transfers;Toileting    Examination-Participation Restrictions Church;Cleaning;Community Activity;Driving;Laundry;Medication Management;Meal Prep;Shop    Stability/Clinical Decision Making Unstable/Unpredictable    Clinical Decision Making High    Rehab Potential Good    PT Frequency 2x / week    PT Duration Other (comment)   10 weeks   PT Treatment/Interventions ADLs/Self Care Home Management;Aquatic Therapy;Cryotherapy;Electrical Stimulation;Moist Heat;Gait training;Stair training;Functional mobility training;Therapeutic activities;Therapeutic exercise;Balance training;Manual techniques;Orthotic Fit/Training;Patient/family education;Cognitive remediation;Neuromuscular re-education;Passive range of motion;Energy conservation;Visual/perceptual remediation/compensation;Joint Manipulations    PT Next Visit Plan Perform BBS and address LTG#4; trial of rollator and standard walker    PT  Home Exercise Plan Not issues on eval    Consulted and Agree with Plan of Care Patient             Patient will benefit from skilled therapeutic intervention in order to improve the following deficits and impairments:  Abnormal gait, Decreased activity tolerance, Decreased balance, Decreased cognition, Decreased coordination, Decreased safety awareness, Decreased range of motion, Decreased mobility, Decreased knowledge of precautions, Decreased endurance, Decreased scar mobility, Decreased strength, Difficulty walking, Dizziness, Impaired flexibility, Increased fascial restricitons, Impaired sensation, Postural dysfunction, Impaired UE functional use, Improper body mechanics  Visit Diagnosis: Other abnormalities of gait and mobility  Muscle weakness (generalized)  Hemiplegia and hemiparesis following cerebral infarction affecting left non-dominant side [facility])     Problem List Patient Active Problem List   Diagnosis Date Noted   Embolic stroke of right basal ganglia (HCC) 06/08/2021   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[place]) 06/08/2021   Cerebral thrombosis with cerebral infarction 05/27/2021   Numbness and tingling in right hand 09/08/2020   Rhabdomyolysis 08/18/2020   Hypokalemia 08/17/2020   Post herpetic neuralgia 06/30/2020   Left lumbar radiculopathy 06/04/2018   Memory difficulties 03/05/2018   Sialadenitis, right parotid 06/24/2017   Insomnia 05/12/2017   Prediabetes 11/08/2016   Vitamin D deficiency 06/08/2009   Depression 01/07/2009   PEPTIC ULCER DISEASE, CHRONIC 01/07/2009   OTHER OBSTRUCTION OF DUODENUM 01/07/2009   HIATAL HERNIA 01/07/2009   Irritable bowel syndrome 01/07/2009   DEGENERATIVE JOINT DISEASE 01/07/2009   ANEMIA, HX OF 01/07/2009   Right middle lobe pulmonary nodule 06/09/2008   Anxiety 04/01/2008   Essential hypertension 04/01/2008   Allergic rhinitis 04/01/2008   GERD 04/01/2008   LOW BACK PAIN SYNDROME 04/01/2008   Migraines 04/01/2008    Kerrie Pleasure,  PT 07/30/2021, 12:26 PM  [facility] [address] [place], [place], [address] Phone: [phone]   Fax:  [phone]  Name: Michaela Rios MRN: [id number] Date of Birth: Aug 18, 1951

## 2021-08-01 ENCOUNTER — Ambulatory Visit
Admission: RE | Admit: 2021-08-01 | Discharge: 2021-08-01 | Disposition: A | Discharge status: Home / Self Care | Payer: Medicare HMO | Source: Ambulatory Visit | Attending: Internal Medicine | Admitting: Internal Medicine

## 2021-08-01 DIAGNOSIS — G9389 Other specified disorders of brain: Secondary | ICD-10-CM | POA: Diagnosis not present

## 2021-08-01 DIAGNOSIS — I639 Cerebral infarction, unspecified: Secondary | ICD-10-CM

## 2021-08-01 DIAGNOSIS — I6350 Cerebral infarction due to unspecified occlusion or stenosis of unspecified cerebral artery: Secondary | ICD-10-CM | POA: Diagnosis not present

## 2021-08-01 DIAGNOSIS — R9389 Abnormal findings on diagnostic imaging of other specified body structures: Secondary | ICD-10-CM

## 2021-08-01 DIAGNOSIS — I6381 Other cerebral infarction due to occlusion or stenosis of small artery: Secondary | ICD-10-CM

## 2021-08-01 IMAGING — MR MR HEAD WO/W CM
13 series · 48 of 48 positions shown · IV contrast (multihance)
Comparison: MR head without and with contrast [DATE] [DATE]

CLINICAL DATA: Stroke, follow-up.  Previous abnormal MRI.

EXAM:
MRI HEAD WITHOUT AND WITH CONTRAST
TECHNIQUE: Multiplanar, multiecho pulse sequences of the brain and surrounding
structures were obtained without and with intravenous contrast.
CONTRAST:  11mL MULTIHANCE GADOBENATE DIMEGLUMINE 529 MG/ML IV SOLN

[Series 2: T1 · sagittal · 5.0mm · 0.45mm/px · 1 of 21 slices shown]
[im 1/21]
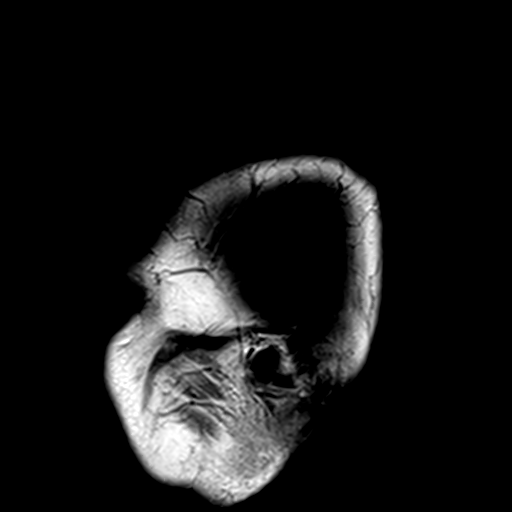

[Series 3: T2 · axial · 5.0mm · 0.60mm/px · 1 of 22 slices shown (1 of 2)]
[im 1/22]
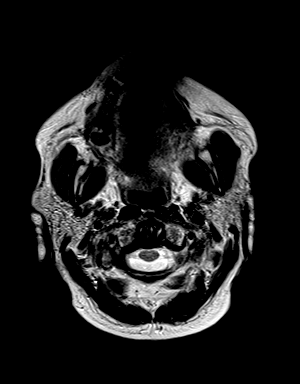

[Series 4: DWI · axial · 3.0mm · 1.80mm/px · z∈[-79,+67]mm · 7 of 100 slices shown (1 of 4)]
[im 1/100]
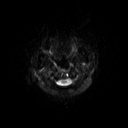
[im 17/100]
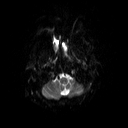
[im 34/100]
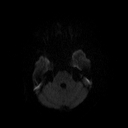
[im 50/100]
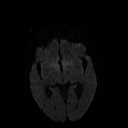
[im 67/100]
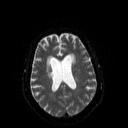
[im 83/100]
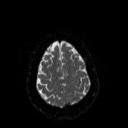
[im 100/100]
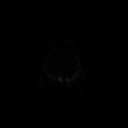

[Series 5: DWI · axial · 3.0mm · 1.80mm/px · z∈[-79,+67]mm · 3 of 50 slices shown (2 of 4)]
[im 1/50]
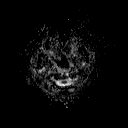
[im 25/50]
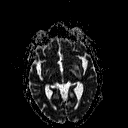
[im 50/50]
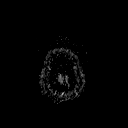

[Series 6: DWI · coronal · 5.0mm · 1.80mm/px · 4 of 67 slices shown (3 of 4)]
[im 1/67]
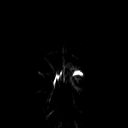
[im 23/67]
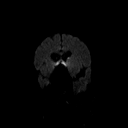
[im 45/67]
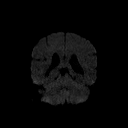
[im 67/67]
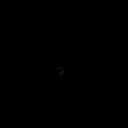

[Series 7: DWI · coronal · 5.0mm · 1.80mm/px · 2 of 34 slices shown (4 of 4)]
[im 1/34]
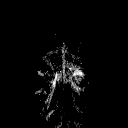
[im 34/34]
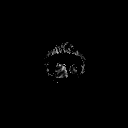

[Series 8: FLAIR · axial · 3.0mm · 0.45mm/px · z∈[-73,+61]mm · 2 of 30 slices shown (1 of 2)]
[im 1/30]
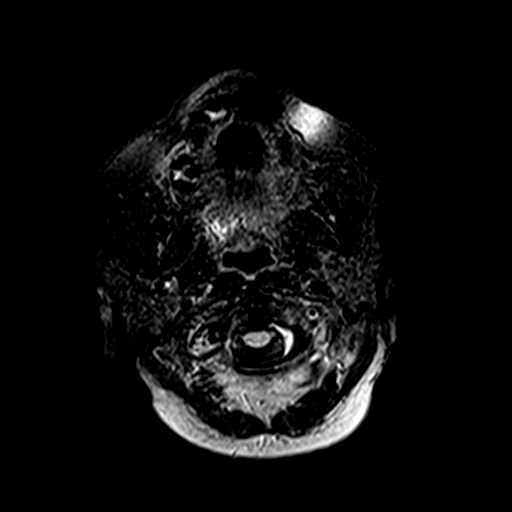
[im 30/30]
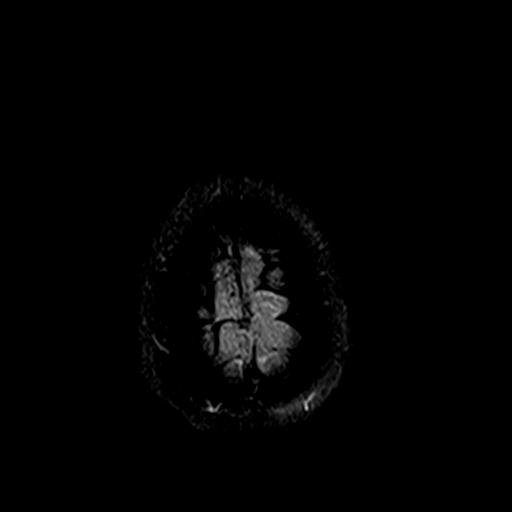

[Series 10: swi_images · axial · 4.0mm · 0.90mm/px · z∈[-76,+63]mm · 2 of 36 slices shown]
[im 1/36]
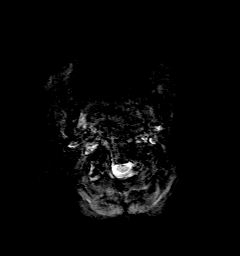
[im 36/36]
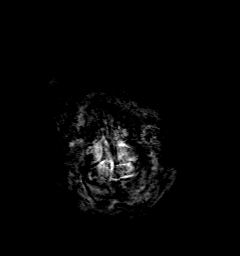

[Series 11: t1_mpr_tra · axial · 1.0mm · 0.75mm/px · z∈[-78,+64]mm · 10 of 144 slices shown]
[im 1/144]
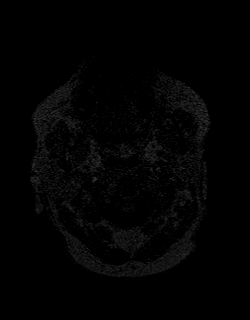
[im 16/144]
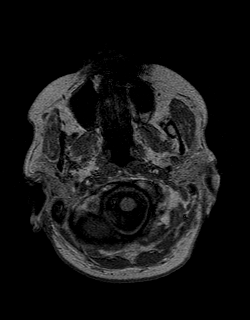
[im 32/144]
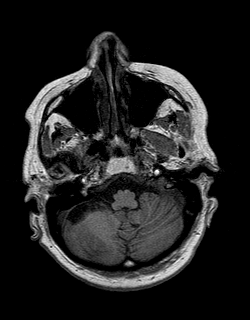
[im 48/144]
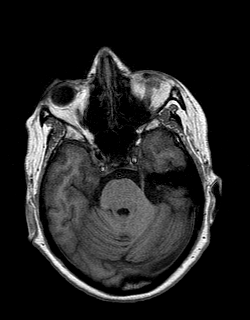
[im 64/144]
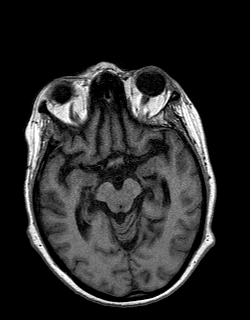
[im 80/144]
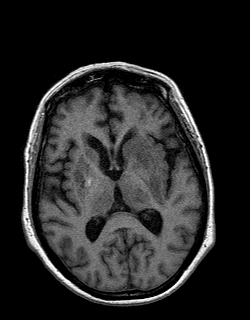
[im 96/144]
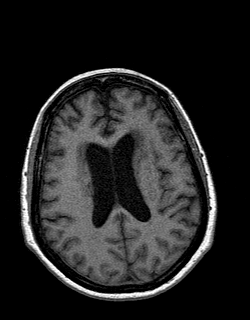
[im 112/144]
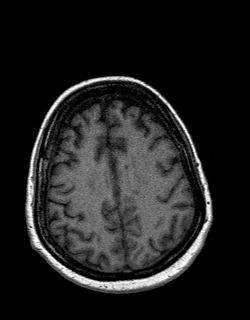
[im 128/144]
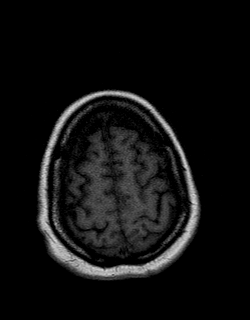
[im 144/144]
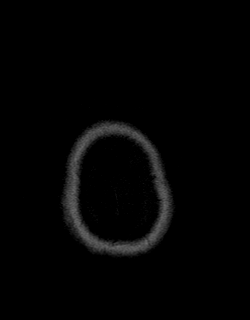

[Series 12: FLAIR · sagittal · 5.0mm · 0.45mm/px · 2 of 25 slices shown (2 of 2)]
[im 1/25]
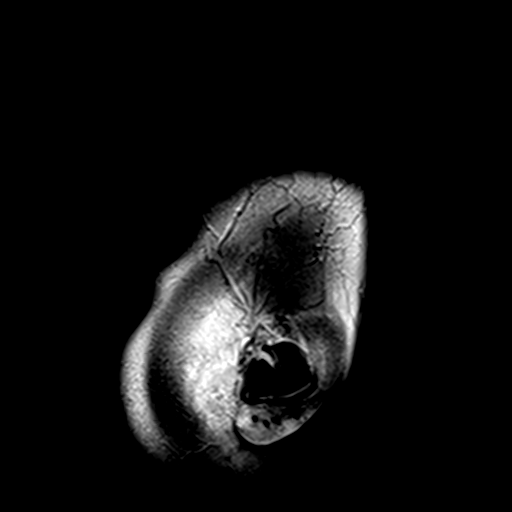
[im 25/25]
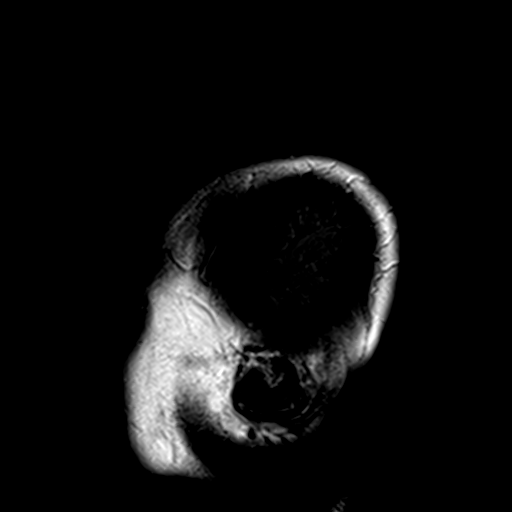

[Series 13: T2 · coronal · 5.0mm · 0.45mm/px · 2 of 25 slices shown (2 of 2)]
[im 1/25]
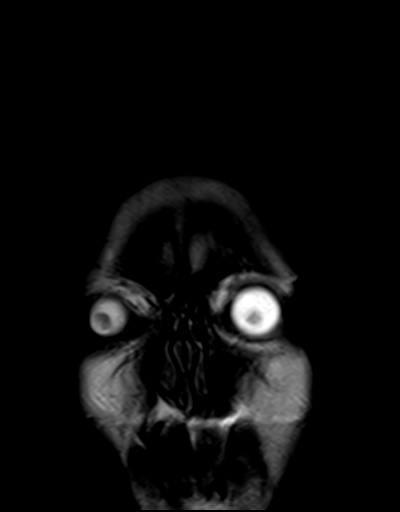
[im 25/25]
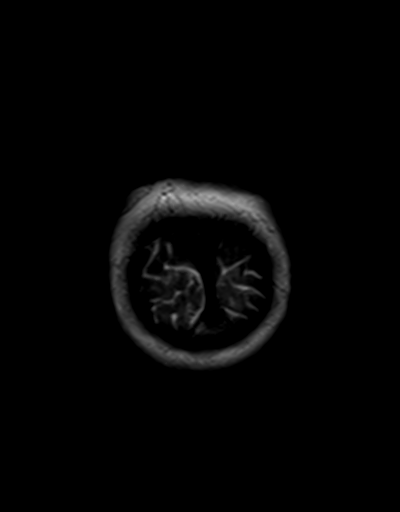

[Series 14: t1_mpr_tra post · axial · 1.0mm · 0.75mm/px · z∈[-78,+64]mm · 10 of 144 slices shown]
[im 1/144]
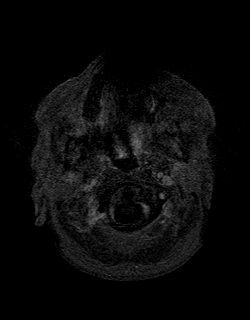
[im 16/144]
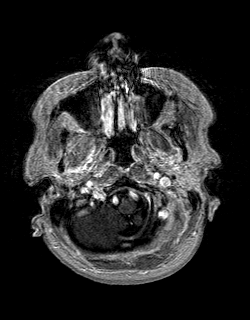
[im 32/144]
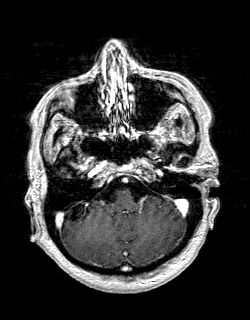
[im 48/144]
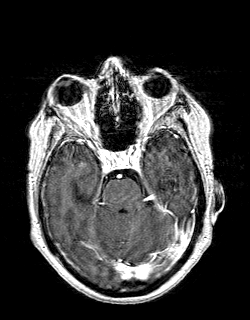
[im 64/144]
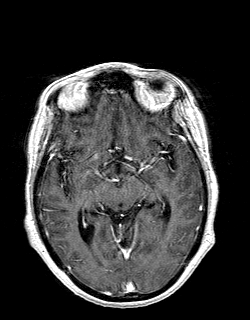
[im 80/144]
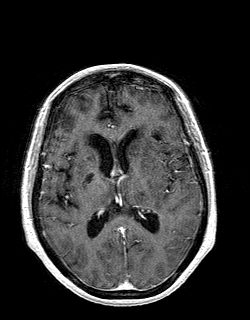
[im 96/144]
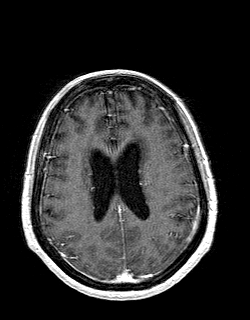
[im 112/144]
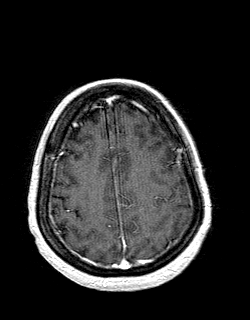
[im 128/144]
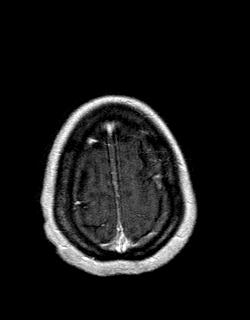
[im 144/144]
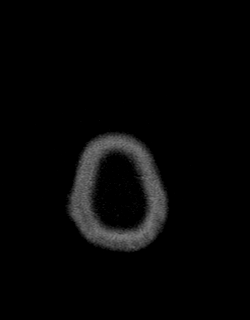

[Series 15: post cor · coronal · 5.0mm · 0.45mm/px · 2 of 25 slices shown]
[im 1/25]
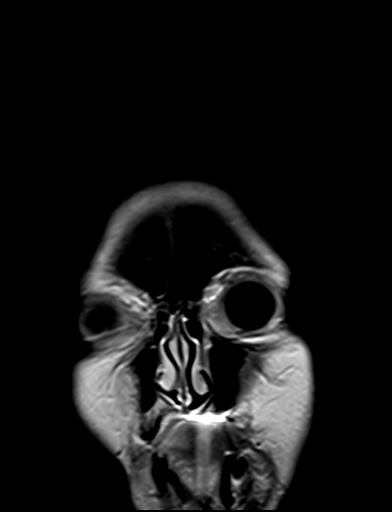
[im 25/25]
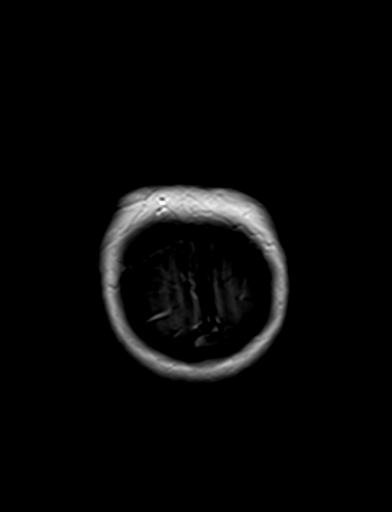

[48 of 48 positions shown; findings below may reference images not displayed]

FINDINGS: Brain: Continued expected evolution of deep gray matter infarcts
noted. No residual enhancement is present. Punctate areas of
diffusion signal surround the infarct involving the right internal
capsule and caudate. Additional punctate areas of diffusion
hyperintensity noted within the left caudate head and posterior left
putamen. Areas of confluent T2 signal change are noted in the basal
ganglia bilaterally and left greater than right thalami without
significant interval change.

No acute hemorrhage or mass lesion is present.

The ventricles are of normal size. No significant extraaxial fluid
collection is present.

Postcontrast images are otherwise within normal limits.

Vascular: Flow is present in the major intracranial arteries.

Skull and upper cervical spine: Mild degenerative changes are noted
at C2-3. The craniocervical junction is normal. Midline structures
are within normal limits. Marrow signal is unremarkable.

Sinuses/Orbits: The paranasal sinuses and mastoid air cells are
clear. The globes and orbits are within normal limits.
IMPRESSION: 1. Continued expected evolution of deep gray matter infarcts
bilaterally.
2. Additional punctate areas of diffusion hyperintensity are present
within the left caudate head and posterior left putamen. These
likely represent areas of acute/subacute ischemia.
3. Stable diffuse T2 signal change in the basal ganglia bilaterally
and left greater than right thalami. This likely reflects the
sequela of chronic microvascular ischemia.

## 2021-08-01 MED ORDER — GADOBENATE DIMEGLUMINE 529 MG/ML IV SOLN
11.0000 mL | Freq: Once | INTRAVENOUS | Status: AC | PRN
  Administered 2021-08-01: 11 mL via INTRAVENOUS

## 2021-08-02 ENCOUNTER — Encounter: Payer: Self-pay | Admitting: Internal Medicine

## 2021-08-03 ENCOUNTER — Ambulatory Visit: Payer: Medicare HMO

## 2021-08-03 NOTE — Telephone Encounter (Signed)
Please advise as the pts husband has called and asked that his MyChart message be sent to Dr. [name] to further help with the decision of her MRI results.  "This is Michaela Rios's husband on Tuesday morning, Kayden is doing much better than the past 2 days. She is much stronger, her balance is much better and she is alert and walking. For two days she complained about having headaces and on both occasions, she took two ibuprofen which seem to help. Not sure what to ecpect today but all seems promising. Any thoughts would be welcomed."

## 2021-08-04 ENCOUNTER — Ambulatory Visit: Payer: Medicare HMO

## 2021-08-04 ENCOUNTER — Other Ambulatory Visit: Payer: Self-pay

## 2021-08-04 DIAGNOSIS — R2689 Other abnormalities of gait and mobility: Secondary | ICD-10-CM

## 2021-08-04 DIAGNOSIS — I69354 Hemiplegia and hemiparesis following cerebral infarction affecting left non-dominant side: Secondary | ICD-10-CM | POA: Diagnosis not present

## 2021-08-04 DIAGNOSIS — R41840 Attention and concentration deficit: Secondary | ICD-10-CM | POA: Diagnosis not present

## 2021-08-04 DIAGNOSIS — M6281 Muscle weakness (generalized): Secondary | ICD-10-CM | POA: Diagnosis not present

## 2021-08-04 DIAGNOSIS — R278 Other lack of coordination: Secondary | ICD-10-CM | POA: Diagnosis not present

## 2021-08-04 DIAGNOSIS — R41844 Frontal lobe and executive function deficit: Secondary | ICD-10-CM | POA: Diagnosis not present

## 2021-08-04 NOTE — Therapy (Signed)
[facility] [address] [place], [place], [address] Phone: [phone]   Fax:  [phone]  Physical Therapy Treatment  Patient Details  Name: Michaela Rios MRN: [id number] Date of Birth: [birth year] Referring Provider (PT): Lauraine Rinne, PA-C   Encounter Date: 08/04/2021   PT End of Session - 08/04/21 1322     Visit Number 2    Number of Visits 17    Date for PT Re-Evaluation 10/08/21    Progress Note Due on Visit 10    PT Start Time 1320    PT Stop Time 1400    PT Time Calculation (min) 40 min    Equipment Utilized During Treatment Gait belt    Activity Tolerance Patient tolerated treatment well    Behavior During Therapy WFL for tasks assessed/performed             Past Medical History:  Diagnosis Date   Anemia    Anxiety    Arthritis    Cataract    Chronic migraine    follows with neuro for same   Clotting disorder (HCC)    clot in ovarian vein- hx   Esophagitis    GERD (gastroesophageal reflux disease)    Hypertension    IBS (irritable bowel syndrome)    Low back pain syndrome    MRSA (methicillin resistant Staphylococcus aureus) infection 04/17/12   left torso   Peptic ulcer disease 2003, 12/2012   Peripheral vascular disease ([place])    Vitamin D deficiency     Past Surgical History:  Procedure Laterality Date   ABDOMINAL HYSTERECTOMY     BUBBLE STUDY  06/03/2021   Procedure: BUBBLE STUDY;  Surgeon: Donato Heinz, MD;  Location: [facility];  Service: Cardiovascular;;   IR ANGIO EXTERNAL CAROTID SEL EXT CAROTID UNI R MOD SED  06/01/2021   IR ANGIO INTRA EXTRACRAN SEL COM CAROTID INNOMINATE UNI L MOD SED  06/01/2021   IR ANGIO INTRA EXTRACRAN SEL INTERNAL CAROTID UNI R MOD SED  06/01/2021   IR ANGIO VERTEBRAL SEL VERTEBRAL UNI R MOD SED  06/01/2021   IR US GUIDE VASC ACCESS RIGHT  06/01/2021   LAPAROSCOPIC BILATERAL SALPINGO OOPHERECTOMY     LOOP RECORDER INSERTION N/A 06/08/2021    Procedure: LOOP RECORDER INSERTION;  Surgeon: Constance Haw, MD;  Location: [facility];  Service: Cardiovascular;  Laterality: N/A;   pud surgery w/duod sticture-plasty and vagotomy  2003   Dr. Marlou Starks   TEE WITHOUT CARDIOVERSION N/A 06/03/2021   Procedure: TRANSESOPHAGEAL ECHOCARDIOGRAM (TEE);  Surgeon: Donato Heinz, MD;  Location: [facility];  Service: Cardiovascular;  Laterality: N/A;   TONSILLECTOMY AND ADENOIDECTOMY     as a child   TUBAL LIGATION  1984    There were no vitals filed for this visit.   Subjective Assessment - 08/04/21 1322     Subjective Pt reports she fell twice over the weekend and both times she slid out of chair. Once in the shower chair and one in dining chair. No injury reported from last 2 falls over the weekend.. Per husband, she is having re occuring strokes according to MRI on Sunday.    Patient is accompained by: Family member   Husband   Pertinent History CVA, multiple falls    Limitations Standing;Walking;House hold activities;Sitting    How long can you sit comfortably? unlimited in supported sitting; 5 min in unsupported sitting as pt starts leaning to her R and posterior  How long can you stand comfortably? <1 min    How long can you walk comfortably? 30 feet    Patient Stated Goals get stronger    Currently in Pain? No/denies                [facility] PT Assessment - 08/04/21 1330       Standardized Balance Assessment   Standardized Balance Assessment 10 meter walk test;Berg Balance Test    10 Meter Walk 0.38ms      Berg Balance Test   Sit to Stand Able to stand  independently using hands    Standing Unsupported Needs several tries to stand 30 seconds unsupported    Sitting with Back Unsupported but Feet Supported on Floor or Stool Able to sit safely and securely 2 minutes    Stand to Sit Uses backs of legs against chair to control descent    Transfers Able to transfer with verbal cueing and /or supervision     Standing Unsupported with Eyes Closed Able to stand 10 seconds with supervision    Standing Unsupported with Feet Together Needs help to attain position and unable to hold for 15 seconds    From Standing, Reach Forward with Outstretched Arm Loses balance while trying/requires external support    From Standing Position, Pick up Object from Floor Unable to try/needs assist to keep balance    From Standing Position, Turn to Look Behind Over each Shoulder Needs assist to keep from losing balance and falling    Turn 360 Degrees Needs assistance while turning    Standing Unsupported, Alternately Place Feet on Step/Stool Needs assistance to keep from falling or unable to try    Standing Unsupported, One Foot in Front Loses balance while stepping or standing    Standing on One Leg Unable to try or needs assist to prevent fall    Total Score 15                                    PT Education - 08/04/21 2125     Education Details Pt and husband educated on not using cane at all at home. Only using rollator for in home and community mobility. Pt and husband educated on practicing walking for upto 5 min inside the home everyday.    Person(s) Educated Patient;Spouse    Methods Explanation    Comprehension Verbalized understanding              PT Short Term Goals - 07/30/21 1208       PT SHORT TERM GOAL #1   Title Pt will be able to ambulate with rollator or standard walker for 115' with SBA to improve in home mobility    Baseline quad cane 30 feet (07/30/21)    Time 4    Period Weeks    Status New    Target Date 08/27/21      PT SHORT TERM GOAL #2   Title Pt will be able to perform 5x sit to stand with bil HHA in <1 min to improve functional strength    Baseline 1 min 12 sec but only able to complete 4 reps I and required min A for 5th rep (07/30/21)    Time 4    Period Weeks    Status New    Target Date 08/27/21      PT SHORT TERM GOAL #3   Title Pt will  be  able to go up and down 4 steps with use of quad cane and min A to safely get in and out of their home.    Baseline Husband helps her (07/30/21)    Time 4    Period Weeks    Status New    Target Date 08/27/21               PT Long Term Goals - 08/04/21 2122       PT LONG TERM GOAL #1   Title Patient will be able to perform 5x sit to stand in under 30 seconds to improve functional strength    Baseline 1 min 12 sec (07/30/21)    Time 10    Period Weeks    Status New      PT LONG TERM GOAL #2   Title Pt will be able to perform TUG in under 30 sec with LRAD to improve functional mobility    Baseline 62 sec with quad cane (07/30/21)    Time 10    Period Weeks    Status New      PT LONG TERM GOAL #3   Title Pt will be able to ambulate 400 feet with LRAD to improve community ambulation    Baseline 30 feet with quad cane (07/30/21)    Time 10    Period Weeks    Status New      PT LONG TERM GOAL #4   Title Pt will demo 6 points improvement from baseline on BBS to improve functional balance and reduce fall risk    Baseline 15/56 (08/04/21)    Time 10    Period Weeks    Status New    Target Date 10/08/21      PT LONG TERM GOAL #5   Title Patient will demo at 0.55 m/s gait speed with RW to improve overall functional gait and community ambulation    Baseline 0.15 m/s with rolling walker (08/04/21)    Time 10    Period Weeks    Target Date 10/08/21                   Plan - 08/04/21 2124     Clinical Impression Statement Patient demonstrated 15/56 on BBS indicating very high fall risk. Patient has had 2 falls since her evaluation. Patient demo 0.15 m/s gait speed indicating high fall risk and limited house hold mobility. Patient will benefit from skilled PT.    Personal Factors and Comorbidities Age;Comorbidity 3+;Past/Current Experience;Time since onset of injury/illness/exacerbation    Comorbidities HTN, prediabetes, anxiety and depression, CKD stage 3, remote hx of PUD,  chronic low back pain, migraines    Examination-Activity Limitations Bathing;Carry;Caring for Others;Bend;Bed Mobility;Lift;Squat;Stairs;Stand;Transfers;Toileting    Examination-Participation Restrictions Church;Cleaning;Community Activity;Driving;Laundry;Medication Management;Meal Prep;Shop    Stability/Clinical Decision Making Unstable/Unpredictable    Rehab Potential Good    PT Frequency 2x / week    PT Duration Other (comment)   10 weeks   PT Treatment/Interventions ADLs/Self Care Home Management;Aquatic Therapy;Cryotherapy;Electrical Stimulation;Moist Heat;Gait training;Stair training;Functional mobility training;Therapeutic activities;Therapeutic exercise;Balance training;Manual techniques;Orthotic Fit/Training;Patient/family education;Cognitive remediation;Neuromuscular re-education;Passive range of motion;Energy conservation;Visual/perceptual remediation/compensation;Joint Manipulations    PT Next Visit Plan Perform BBS and address LTG#4; trial of rollator and standard walker    PT Home Exercise Plan Not issues on eval    Consulted and Agree with Plan of Care Patient             Patient will benefit from skilled therapeutic intervention in order to  improve the following deficits and impairments:  Abnormal gait, Decreased activity tolerance, Decreased balance, Decreased cognition, Decreased coordination, Decreased safety awareness, Decreased range of motion, Decreased mobility, Decreased knowledge of precautions, Decreased endurance, Decreased scar mobility, Decreased strength, Difficulty walking, Dizziness, Impaired flexibility, Increased fascial restricitons, Impaired sensation, Postural dysfunction, Impaired UE functional use, Improper body mechanics  Visit Diagnosis: Muscle weakness (generalized)  Hemiplegia and hemiparesis following cerebral infarction affecting left non-dominant side (HCC)  Other abnormalities of gait and mobility     Problem List Patient Active Problem  List   Diagnosis Date Noted   Embolic stroke of right basal ganglia ([place]) 06/08/2021   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[place]) 06/08/2021   Cerebral thrombosis with cerebral infarction 05/27/2021   Numbness and tingling in right hand 09/08/2020   Rhabdomyolysis 08/18/2020   Hypokalemia 08/17/2020   Post herpetic neuralgia 06/30/2020   Left lumbar radiculopathy 06/04/2018   Memory difficulties 03/05/2018   Sialadenitis, right parotid 06/24/2017   Insomnia 05/12/2017   Prediabetes 11/08/2016   Vitamin D deficiency 06/08/2009   Depression 01/07/2009   PEPTIC ULCER DISEASE, CHRONIC 01/07/2009   OTHER OBSTRUCTION OF DUODENUM 01/07/2009   HIATAL HERNIA 01/07/2009   Irritable bowel syndrome 01/07/2009   DEGENERATIVE JOINT DISEASE 01/07/2009   ANEMIA, HX OF 01/07/2009   Right middle lobe pulmonary nodule 06/09/2008   Anxiety 04/01/2008   Essential hypertension 04/01/2008   Allergic rhinitis 04/01/2008   GERD 04/01/2008   LOW BACK PAIN SYNDROME 04/01/2008   Migraines 04/01/2008    Kerrie Pleasure, PT 08/04/2021, 9:27 PM  [facility] [address] [place], [place], [address] Phone: [phone]   Fax:  [phone]  Name: AMARILYS MEEKER MRN: [id number] Date of Birth: [birth year]

## 2021-08-05 ENCOUNTER — Encounter: Payer: Medicare HMO | Admitting: Occupational Therapy

## 2021-08-05 NOTE — Progress Notes (Signed)
[facility] Summary Report / Loop Recorder 

## 2021-08-09 ENCOUNTER — Ambulatory Visit: Payer: Medicare HMO | Admitting: Physical Therapy

## 2021-08-10 ENCOUNTER — Emergency Department (HOSPITAL_COMMUNITY): Payer: Medicare HMO

## 2021-08-10 ENCOUNTER — Inpatient Hospital Stay (HOSPITAL_COMMUNITY)
Admission: EM | Admit: 2021-08-10 | Discharge: 2021-08-28 | DRG: 871 | Disposition: E | Payer: Medicare HMO | Attending: Pulmonary Disease | Admitting: Pulmonary Disease

## 2021-08-10 ENCOUNTER — Encounter (HOSPITAL_COMMUNITY): Payer: Self-pay

## 2021-08-10 ENCOUNTER — Other Ambulatory Visit: Payer: Self-pay

## 2021-08-10 DIAGNOSIS — Q211 Atrial septal defect: Secondary | ICD-10-CM

## 2021-08-10 DIAGNOSIS — E876 Hypokalemia: Secondary | ICD-10-CM | POA: Diagnosis present

## 2021-08-10 DIAGNOSIS — I10 Essential (primary) hypertension: Secondary | ICD-10-CM | POA: Diagnosis not present

## 2021-08-10 DIAGNOSIS — K219 Gastro-esophageal reflux disease without esophagitis: Secondary | ICD-10-CM

## 2021-08-10 DIAGNOSIS — N1831 Chronic kidney disease, stage 3a: Secondary | ICD-10-CM | POA: Diagnosis not present

## 2021-08-10 DIAGNOSIS — R29818 Other symptoms and signs involving the nervous system: Secondary | ICD-10-CM | POA: Diagnosis not present

## 2021-08-10 DIAGNOSIS — Z91013 Allergy to seafood: Secondary | ICD-10-CM

## 2021-08-10 DIAGNOSIS — R531 Weakness: Secondary | ICD-10-CM | POA: Diagnosis not present

## 2021-08-10 DIAGNOSIS — Z743 Need for continuous supervision: Secondary | ICD-10-CM | POA: Diagnosis not present

## 2021-08-10 DIAGNOSIS — I739 Peripheral vascular disease, unspecified: Secondary | ICD-10-CM | POA: Diagnosis not present

## 2021-08-10 DIAGNOSIS — E43 Unspecified severe protein-calorie malnutrition: Secondary | ICD-10-CM | POA: Diagnosis not present

## 2021-08-10 DIAGNOSIS — M4696 Unspecified inflammatory spondylopathy, lumbar region: Secondary | ICD-10-CM | POA: Diagnosis not present

## 2021-08-10 DIAGNOSIS — M47816 Spondylosis without myelopathy or radiculopathy, lumbar region: Secondary | ICD-10-CM | POA: Diagnosis not present

## 2021-08-10 DIAGNOSIS — R400 Somnolence: Secondary | ICD-10-CM | POA: Diagnosis not present

## 2021-08-10 DIAGNOSIS — R6521 Severe sepsis with septic shock: Secondary | ICD-10-CM | POA: Diagnosis not present

## 2021-08-10 DIAGNOSIS — I6310 Cerebral infarction due to embolism of unspecified precerebral artery: Secondary | ICD-10-CM | POA: Diagnosis not present

## 2021-08-10 DIAGNOSIS — F32A Depression, unspecified: Secondary | ICD-10-CM | POA: Diagnosis not present

## 2021-08-10 DIAGNOSIS — G249 Dystonia, unspecified: Secondary | ICD-10-CM | POA: Diagnosis present

## 2021-08-10 DIAGNOSIS — I129 Hypertensive chronic kidney disease with stage 1 through stage 4 chronic kidney disease, or unspecified chronic kidney disease: Secondary | ICD-10-CM | POA: Diagnosis present

## 2021-08-10 DIAGNOSIS — F419 Anxiety disorder, unspecified: Secondary | ICD-10-CM | POA: Diagnosis not present

## 2021-08-10 DIAGNOSIS — Z888 Allergy status to other drugs, medicaments and biological substances status: Secondary | ICD-10-CM

## 2021-08-10 DIAGNOSIS — Z8673 Personal history of transient ischemic attack (TIA), and cerebral infarction without residual deficits: Secondary | ICD-10-CM

## 2021-08-10 DIAGNOSIS — I5030 Unspecified diastolic (congestive) heart failure: Secondary | ICD-10-CM | POA: Diagnosis not present

## 2021-08-10 DIAGNOSIS — J690 Pneumonitis due to inhalation of food and vomit: Secondary | ICD-10-CM | POA: Diagnosis not present

## 2021-08-10 DIAGNOSIS — R509 Fever, unspecified: Principal | ICD-10-CM

## 2021-08-10 DIAGNOSIS — R911 Solitary pulmonary nodule: Secondary | ICD-10-CM | POA: Diagnosis present

## 2021-08-10 DIAGNOSIS — D696 Thrombocytopenia, unspecified: Secondary | ICD-10-CM | POA: Diagnosis not present

## 2021-08-10 DIAGNOSIS — Z7982 Long term (current) use of aspirin: Secondary | ICD-10-CM

## 2021-08-10 DIAGNOSIS — G43909 Migraine, unspecified, not intractable, without status migrainosus: Secondary | ICD-10-CM | POA: Diagnosis present

## 2021-08-10 DIAGNOSIS — J189 Pneumonia, unspecified organism: Secondary | ICD-10-CM

## 2021-08-10 DIAGNOSIS — R627 Adult failure to thrive: Secondary | ICD-10-CM | POA: Diagnosis present

## 2021-08-10 DIAGNOSIS — Z880 Allergy status to penicillin: Secondary | ICD-10-CM

## 2021-08-10 DIAGNOSIS — Z515 Encounter for palliative care: Secondary | ICD-10-CM

## 2021-08-10 DIAGNOSIS — R918 Other nonspecific abnormal finding of lung field: Secondary | ICD-10-CM | POA: Diagnosis not present

## 2021-08-10 DIAGNOSIS — N179 Acute kidney failure, unspecified: Secondary | ICD-10-CM | POA: Diagnosis present

## 2021-08-10 DIAGNOSIS — R109 Unspecified abdominal pain: Secondary | ICD-10-CM

## 2021-08-10 DIAGNOSIS — R69 Illness, unspecified: Secondary | ICD-10-CM | POA: Diagnosis not present

## 2021-08-10 DIAGNOSIS — R000 Tachycardia, unspecified: Secondary | ICD-10-CM | POA: Diagnosis not present

## 2021-08-10 DIAGNOSIS — I63313 Cerebral infarction due to thrombosis of bilateral middle cerebral arteries: Secondary | ICD-10-CM | POA: Diagnosis not present

## 2021-08-10 DIAGNOSIS — R6510 Systemic inflammatory response syndrome (SIRS) of non-infectious origin without acute organ dysfunction: Secondary | ICD-10-CM | POA: Diagnosis not present

## 2021-08-10 DIAGNOSIS — Z20822 Contact with and (suspected) exposure to covid-19: Secondary | ICD-10-CM | POA: Diagnosis present

## 2021-08-10 DIAGNOSIS — E860 Dehydration: Secondary | ICD-10-CM | POA: Diagnosis present

## 2021-08-10 DIAGNOSIS — I6340 Cerebral infarction due to embolism of unspecified cerebral artery: Secondary | ICD-10-CM | POA: Insufficient documentation

## 2021-08-10 DIAGNOSIS — R1310 Dysphagia, unspecified: Secondary | ICD-10-CM | POA: Diagnosis present

## 2021-08-10 DIAGNOSIS — N390 Urinary tract infection, site not specified: Secondary | ICD-10-CM | POA: Diagnosis not present

## 2021-08-10 DIAGNOSIS — E872 Acidosis: Secondary | ICD-10-CM | POA: Diagnosis present

## 2021-08-10 DIAGNOSIS — J90 Pleural effusion, not elsewhere classified: Secondary | ICD-10-CM | POA: Diagnosis not present

## 2021-08-10 DIAGNOSIS — A419 Sepsis, unspecified organism: Principal | ICD-10-CM

## 2021-08-10 DIAGNOSIS — I700 Atherosclerosis of aorta: Secondary | ICD-10-CM | POA: Diagnosis present

## 2021-08-10 DIAGNOSIS — E871 Hypo-osmolality and hyponatremia: Secondary | ICD-10-CM | POA: Diagnosis present

## 2021-08-10 DIAGNOSIS — Z881 Allergy status to other antibiotic agents status: Secondary | ICD-10-CM

## 2021-08-10 DIAGNOSIS — R0602 Shortness of breath: Secondary | ICD-10-CM

## 2021-08-10 DIAGNOSIS — G9341 Metabolic encephalopathy: Secondary | ICD-10-CM | POA: Diagnosis present

## 2021-08-10 DIAGNOSIS — I639 Cerebral infarction, unspecified: Secondary | ICD-10-CM

## 2021-08-10 DIAGNOSIS — Z4659 Encounter for fitting and adjustment of other gastrointestinal appliance and device: Secondary | ICD-10-CM

## 2021-08-10 DIAGNOSIS — Z79899 Other long term (current) drug therapy: Secondary | ICD-10-CM

## 2021-08-10 DIAGNOSIS — M545 Low back pain, unspecified: Secondary | ICD-10-CM | POA: Diagnosis present

## 2021-08-10 DIAGNOSIS — G8114 Spastic hemiplegia affecting left nondominant side: Secondary | ICD-10-CM | POA: Diagnosis not present

## 2021-08-10 DIAGNOSIS — Z681 Body mass index (BMI) 19 or less, adult: Secondary | ICD-10-CM

## 2021-08-10 DIAGNOSIS — G8929 Other chronic pain: Secondary | ICD-10-CM | POA: Diagnosis present

## 2021-08-10 DIAGNOSIS — J9811 Atelectasis: Secondary | ICD-10-CM | POA: Diagnosis not present

## 2021-08-10 DIAGNOSIS — R768 Other specified abnormal immunological findings in serum: Secondary | ICD-10-CM | POA: Diagnosis present

## 2021-08-10 DIAGNOSIS — R748 Abnormal levels of other serum enzymes: Secondary | ICD-10-CM | POA: Diagnosis not present

## 2021-08-10 DIAGNOSIS — D631 Anemia in chronic kidney disease: Secondary | ICD-10-CM | POA: Diagnosis not present

## 2021-08-10 DIAGNOSIS — R0902 Hypoxemia: Secondary | ICD-10-CM | POA: Diagnosis not present

## 2021-08-10 DIAGNOSIS — D6862 Lupus anticoagulant syndrome: Secondary | ICD-10-CM | POA: Diagnosis present

## 2021-08-10 DIAGNOSIS — F3289 Other specified depressive episodes: Secondary | ICD-10-CM

## 2021-08-10 DIAGNOSIS — Z66 Do not resuscitate: Secondary | ICD-10-CM | POA: Diagnosis not present

## 2021-08-10 DIAGNOSIS — K5900 Constipation, unspecified: Secondary | ICD-10-CM | POA: Diagnosis present

## 2021-08-10 DIAGNOSIS — I959 Hypotension, unspecified: Secondary | ICD-10-CM | POA: Diagnosis not present

## 2021-08-10 DIAGNOSIS — R7303 Prediabetes: Secondary | ICD-10-CM | POA: Diagnosis present

## 2021-08-10 DIAGNOSIS — R0689 Other abnormalities of breathing: Secondary | ICD-10-CM | POA: Diagnosis not present

## 2021-08-10 DIAGNOSIS — Z4682 Encounter for fitting and adjustment of non-vascular catheter: Secondary | ICD-10-CM | POA: Diagnosis not present

## 2021-08-10 LAB — URINALYSIS, ROUTINE W REFLEX MICROSCOPIC
Bilirubin Urine: NEGATIVE
Glucose, UA: NEGATIVE mg/dL
Ketones, ur: 5 mg/dL — AB
Nitrite: NEGATIVE
Protein, ur: 30 mg/dL — AB
Specific Gravity, Urine: 1.013 (ref 1.005–1.030)
pH: 7 (ref 5.0–8.0)

## 2021-08-10 LAB — COMPREHENSIVE METABOLIC PANEL
ALT: 12 U/L (ref 0–44)
AST: 26 U/L (ref 15–41)
Albumin: 2.9 g/dL — ABNORMAL LOW (ref 3.5–5.0)
Alkaline Phosphatase: 66 U/L (ref 38–126)
Anion gap: 11 (ref 5–15)
BUN: 12 mg/dL (ref 8–23)
CO2: 19 mmol/L — ABNORMAL LOW (ref 22–32)
Calcium: 8.5 mg/dL — ABNORMAL LOW (ref 8.9–10.3)
Chloride: 100 mmol/L (ref 98–111)
Creatinine, Ser: 0.81 mg/dL (ref 0.44–1.00)
GFR, Estimated: >60 mL/min (ref >60)
Glucose, Bld: 103 mg/dL — ABNORMAL HIGH (ref 70–99)
Potassium: 4 mmol/L (ref 3.5–5.1)
Sodium: 130 mmol/L — ABNORMAL LOW (ref 135–145)
Total Bilirubin: 0.8 mg/dL (ref 0.3–1.2)
Total Protein: 7 g/dL (ref 6.5–8.1)

## 2021-08-10 LAB — PROTIME-INR
INR: 1 (ref 0.8–1.2)
Prothrombin Time: 13.2 seconds (ref 11.4–15.2)

## 2021-08-10 LAB — RESP PANEL BY RT-PCR (FLU A&B, COVID) ARPGX2
Influenza A by PCR: NEGATIVE
Influenza B by PCR: NEGATIVE
SARS Coronavirus 2 by RT PCR: NEGATIVE

## 2021-08-10 LAB — CBC WITH DIFFERENTIAL/PLATELET
Abs Immature Granulocytes: 0.03 10*3/uL (ref 0.00–0.07)
Basophils Absolute: 0 10*3/uL (ref 0.0–0.1)
Basophils Relative: 0 %
Eosinophils Absolute: 0.2 10*3/uL (ref 0.0–0.5)
Eosinophils Relative: 3 %
HCT: 36.5 % (ref 36.0–46.0)
Hemoglobin: 12.1 g/dL (ref 12.0–15.0)
Immature Granulocytes: 1 %
Lymphocytes Relative: 13 %
Lymphs Abs: 0.9 10*3/uL (ref 0.7–4.0)
MCH: 28.3 pg (ref 26.0–34.0)
MCHC: 33.2 g/dL (ref 30.0–36.0)
MCV: 85.3 fL (ref 80.0–100.0)
Monocytes Absolute: 0.5 10*3/uL (ref 0.1–1.0)
Monocytes Relative: 8 %
Neutro Abs: 4.9 10*3/uL (ref 1.7–7.7)
Neutrophils Relative %: 75 %
Platelets: 402 10*3/uL — ABNORMAL HIGH (ref 150–400)
RBC: 4.28 MIL/uL (ref 3.87–5.11)
RDW: 15.5 % (ref 11.5–15.5)
Smear Review: NORMAL
WBC: 6.4 10*3/uL (ref 4.0–10.5)
nRBC: 0 % (ref 0.0–0.2)

## 2021-08-10 LAB — APTT: aPTT: 40 seconds — ABNORMAL HIGH (ref 24–36)

## 2021-08-10 LAB — LACTIC ACID, PLASMA: Lactic Acid, Venous: 1.7 mmol/L (ref 0.5–1.9)

## 2021-08-10 IMAGING — DX DG CHEST 1V PORT
1 series · 1 of 1 positions shown · non-contrast
Comparison: Chest x-ray dated [DATE]

CLINICAL DATA: Concern for sepsis

EXAM:
PORTABLE CHEST 1 VIEW

[chest]
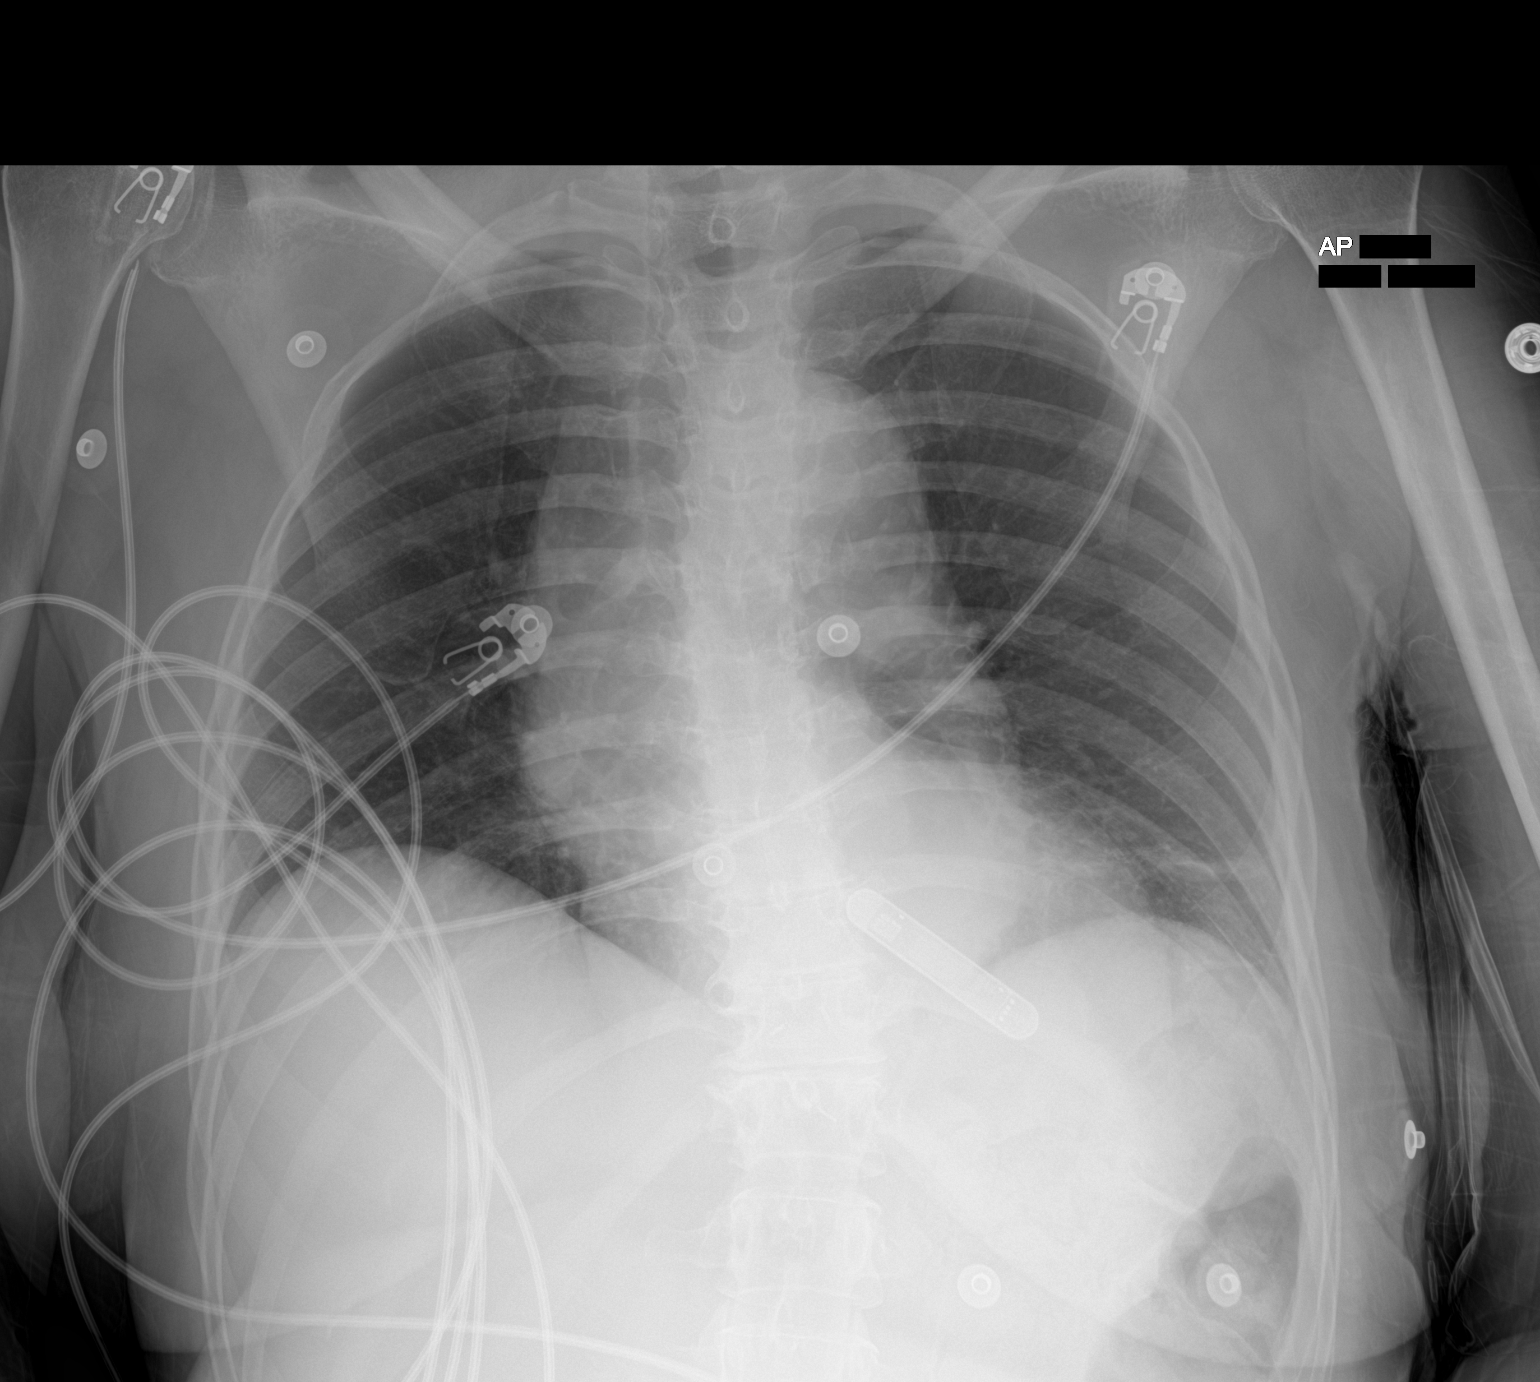

[1 of 1 positions shown; findings below may reference images not displayed]

FINDINGS: Cardiac and mediastinal contours are unchanged when accounting for
differences in lung volumes. Left basilar atelectasis. Lungs
otherwise clear. No pleural effusion or pneumothorax. Loop recorder
device noted.
IMPRESSION: No active disease.

## 2021-08-10 MED ORDER — LACTATED RINGERS IV SOLN: INTRAVENOUS | Status: DC

## 2021-08-10 MED ORDER — BUPROPION HCL ER (XL) 150 MG PO TB24
300.0000 mg | ORAL_TABLET | Freq: Every day | ORAL | Status: DC
  Administered 2021-08-11 – 2021-08-13 (×4): 300 mg via ORAL
  Filled 2021-08-10: qty 2
  Filled 2021-08-10 (×2): qty 1
  Filled 2021-08-10: qty 2

## 2021-08-10 MED ORDER — LACTATED RINGERS IV BOLUS (SEPSIS)
250.0000 mL | Freq: Once | INTRAVENOUS | Status: AC
  Administered 2021-08-10: 250 mL via INTRAVENOUS

## 2021-08-10 MED ORDER — PANTOPRAZOLE SODIUM 40 MG PO TBEC: 40.0000 mg | DELAYED_RELEASE_TABLET | Freq: Every day | ORAL | Status: DC | PRN

## 2021-08-10 MED ORDER — ENOXAPARIN SODIUM 40 MG/0.4ML IJ SOSY
40.0000 mg | PREFILLED_SYRINGE | INTRAMUSCULAR | Status: DC
  Administered 2021-08-10 – 2021-08-18 (×9): 40 mg via SUBCUTANEOUS
  Filled 2021-08-10 (×9): qty 0.4

## 2021-08-10 MED ORDER — VENLAFAXINE HCL ER 75 MG PO CP24
75.0000 mg | ORAL_CAPSULE | Freq: Every day | ORAL | Status: DC
  Administered 2021-08-11 – 2021-08-12 (×3): 75 mg via ORAL
  Filled 2021-08-10 (×4): qty 1

## 2021-08-10 MED ORDER — SODIUM CHLORIDE 0.9 % IV SOLN
2.0000 g | Freq: Once | INTRAVENOUS | Status: AC
  Administered 2021-08-10: 2 g via INTRAVENOUS
  Filled 2021-08-10: qty 2

## 2021-08-10 MED ORDER — ONDANSETRON HCL 4 MG PO TABS: 4.0000 mg | ORAL_TABLET | Freq: Four times a day (QID) | ORAL | Status: DC | PRN

## 2021-08-10 MED ORDER — VENLAFAXINE HCL ER 75 MG PO CP24
150.0000 mg | ORAL_CAPSULE | Freq: Every day | ORAL | Status: DC
  Administered 2021-08-11 – 2021-08-13 (×3): 150 mg via ORAL
  Filled 2021-08-10: qty 2
  Filled 2021-08-10 (×2): qty 1
  Filled 2021-08-10: qty 2
  Filled 2021-08-10: qty 1

## 2021-08-10 MED ORDER — ZOLPIDEM TARTRATE 5 MG PO TABS
10.0000 mg | ORAL_TABLET | Freq: Every evening | ORAL | Status: DC | PRN
  Administered 2021-08-12: 10 mg via ORAL
  Filled 2021-08-10: qty 2

## 2021-08-10 MED ORDER — ALBUTEROL SULFATE (2.5 MG/3ML) 0.083% IN NEBU: 2.5000 mg | INHALATION_SOLUTION | Freq: Four times a day (QID) | RESPIRATORY_TRACT | Status: DC | PRN

## 2021-08-10 MED ORDER — VITAMIN B-12 1000 MCG PO TABS
1000.0000 ug | ORAL_TABLET | Freq: Every day | ORAL | Status: DC
  Administered 2021-08-11 – 2021-08-18 (×9): 1000 ug via ORAL
  Filled 2021-08-10 (×9): qty 1

## 2021-08-10 MED ORDER — SODIUM CHLORIDE 0.9% FLUSH
3.0000 mL | Freq: Two times a day (BID) | INTRAVENOUS | Status: DC
  Administered 2021-08-11 – 2021-08-21 (×14): 3 mL via INTRAVENOUS

## 2021-08-10 MED ORDER — ACETAMINOPHEN 650 MG RE SUPP
650.0000 mg | Freq: Four times a day (QID) | RECTAL | Status: DC | PRN
  Administered 2021-08-14 – 2021-08-19 (×3): 650 mg via RECTAL
  Filled 2021-08-10 (×3): qty 1

## 2021-08-10 MED ORDER — ONDANSETRON HCL 4 MG/2ML IJ SOLN: 4.0000 mg | Freq: Four times a day (QID) | INTRAMUSCULAR | Status: DC | PRN

## 2021-08-10 MED ORDER — LACTATED RINGERS IV BOLUS (SEPSIS)
500.0000 mL | Freq: Once | INTRAVENOUS | Status: AC
  Administered 2021-08-10: 500 mL via INTRAVENOUS

## 2021-08-10 MED ORDER — HYDRALAZINE HCL 25 MG PO TABS
25.0000 mg | ORAL_TABLET | Freq: Two times a day (BID) | ORAL | Status: DC
  Administered 2021-08-11 – 2021-08-13 (×6): 25 mg via ORAL
  Filled 2021-08-10 (×6): qty 1

## 2021-08-10 MED ORDER — AMLODIPINE BESYLATE 5 MG PO TABS
5.0000 mg | ORAL_TABLET | Freq: Every day | ORAL | Status: DC
  Administered 2021-08-11 – 2021-08-15 (×6): 5 mg via ORAL
  Filled 2021-08-10 (×6): qty 1

## 2021-08-10 MED ORDER — VENLAFAXINE HCL ER 75 MG PO CP24: 75.0000 mg | ORAL_CAPSULE | ORAL | Status: DC

## 2021-08-10 MED ORDER — METOPROLOL SUCCINATE ER 100 MG PO TB24
100.0000 mg | ORAL_TABLET | Freq: Every day | ORAL | Status: DC
  Administered 2021-08-11 – 2021-08-15 (×6): 100 mg via ORAL
  Filled 2021-08-10 (×6): qty 1

## 2021-08-10 MED ORDER — METRONIDAZOLE 500 MG/100ML IV SOLN
500.0000 mg | Freq: Once | INTRAVENOUS | Status: AC
  Administered 2021-08-10: 500 mg via INTRAVENOUS
  Filled 2021-08-10: qty 100

## 2021-08-10 MED ORDER — LACTATED RINGERS IV BOLUS (SEPSIS)
1000.0000 mL | Freq: Once | INTRAVENOUS | Status: AC
  Administered 2021-08-10: 1000 mL via INTRAVENOUS

## 2021-08-10 MED ORDER — LORAZEPAM 1 MG PO TABS
1.0000 mg | ORAL_TABLET | Freq: Two times a day (BID) | ORAL | Status: DC | PRN
  Administered 2021-08-11: 1 mg via ORAL
  Filled 2021-08-10: qty 1

## 2021-08-10 MED ORDER — VANCOMYCIN HCL 1250 MG/250ML IV SOLN
1250.0000 mg | INTRAVENOUS | Status: DC
  Administered 2021-08-11: 1250 mg via INTRAVENOUS
  Filled 2021-08-10: qty 250

## 2021-08-10 MED ORDER — TOPIRAMATE 25 MG PO TABS
100.0000 mg | ORAL_TABLET | Freq: Two times a day (BID) | ORAL | Status: DC
  Administered 2021-08-11 – 2021-08-18 (×16): 100 mg via ORAL
  Filled 2021-08-10 (×16): qty 4

## 2021-08-10 MED ORDER — SODIUM CHLORIDE 0.9 % IV SOLN
2.0000 g | Freq: Three times a day (TID) | INTRAVENOUS | Status: DC
  Administered 2021-08-10 – 2021-08-11 (×3): 2 g via INTRAVENOUS
  Filled 2021-08-10 (×3): qty 2

## 2021-08-10 MED ORDER — VANCOMYCIN HCL IN DEXTROSE 1-5 GM/200ML-% IV SOLN
1000.0000 mg | Freq: Once | INTRAVENOUS | Status: AC
  Administered 2021-08-10: 1000 mg via INTRAVENOUS
  Filled 2021-08-10: qty 200

## 2021-08-10 MED ORDER — ACETAMINOPHEN 650 MG RE SUPP
650.0000 mg | Freq: Once | RECTAL | Status: AC
  Administered 2021-08-10: 650 mg via RECTAL
  Filled 2021-08-10: qty 1

## 2021-08-10 MED ORDER — ACETAMINOPHEN 325 MG PO TABS
650.0000 mg | ORAL_TABLET | Freq: Four times a day (QID) | ORAL | Status: DC | PRN
  Administered 2021-08-11 – 2021-08-18 (×4): 650 mg via ORAL
  Filled 2021-08-10 (×5): qty 2

## 2021-08-10 MED ORDER — ASPIRIN EC 81 MG PO TBEC
81.0000 mg | DELAYED_RELEASE_TABLET | Freq: Every day | ORAL | Status: DC
  Administered 2021-08-11 – 2021-08-18 (×9): 81 mg via ORAL
  Filled 2021-08-10 (×10): qty 1

## 2021-08-10 MED ORDER — BUTALBITAL-APAP-CAFFEINE 50-325-40 MG PO TABS: 1.0000 | ORAL_TABLET | Freq: Four times a day (QID) | ORAL | Status: DC | PRN

## 2021-08-10 MED ORDER — HYDRALAZINE HCL 20 MG/ML IJ SOLN: 10.0000 mg | INTRAMUSCULAR | Status: DC | PRN

## 2021-08-10 NOTE — Progress Notes (Signed)
Pharmacy Antibiotic Note  Michaela Rios is a 70 y.o. female admitted on 08/19/2021 with sepsis.  Pharmacy has been consulted for vancomycin and aztreonam dosing.  Plan: Vancomycin '1000mg'$  x1 then '1250mg'$  IV q24h (eAUC 490, Cr used 0.'81mg'$ /dL) Aztreonam 2g IV q8h -Monitor renal function, clinical status, and antibiotic plan -Order vanc levels as necessary  Height: '5\' 6"'$  (167.6 cm) Weight: 55.3 kg (122 lb) IBW/kg (Calculated) : 59.3  Temp (24hrs), Avg:98.8 F (37.1 C), Min:98.8 F (37.1 C), Max:98.8 F (37.1 C)  Recent Labs  Lab 08/24/2021 1039  WBC 6.4  CREATININE 0.81  LATICACIDVEN 1.7    Estimated Creatinine Clearance: 56.4 mL/min (by C-G formula based on SCr of 0.81 mg/dL).    Allergies  Allergen Reactions   Amitriptyline Hcl     Changed personality   Shellfish Allergy Nausea And Vomiting and Other (See Comments)    Tested "high" on allergy test   Amoxicillin Hives   Cephalexin Hives   Lisinopril Cough    .   Penicillins Hives    Antimicrobials this admission: Vanc 9/13 >>  Aztreonam 9/13 >>   Dose adjustments this admission: N/A  Microbiology results: 9/13 BCx:  9/13 UCx:    Thank you for allowing pharmacy to be a part of this patient's care.  Joetta Manners, PharmD, [facility] Emergency Medicine Clinical Pharmacist ED RPh Phone: [place]: [phone]

## 2021-08-10 NOTE — ED Notes (Signed)
Pt in room laying down. NAD chest rising and falling. Pt with delayed responses. Updated pt on plan of care. Pt receptive to information provided.

## 2021-08-10 NOTE — ED Notes (Signed)
Spouse at bedside. [place] in place to suction. Patient incontinent of urine. Draining to suction.

## 2021-08-10 NOTE — ED Provider Notes (Signed)
[facility] EMERGENCY DEPARTMENT Provider Note   CSN: [id number] Arrival date & time: 08/12/2021  1015     History Chief Complaint  Patient presents with   Weakness    Michaela Rios is a 70 y.o. female.  HPI Patient unable to give me history Level 5 caveat history obtained from EMS who obtained history from husband and some from patient. 70 yo female ho stroke, presents today from home with concern for sepsis.  Per EMS patient has been sick since last Thursday.  She has had a fever.  She was febrile to 101.5 on their arrival.  Her husband is a caregiver.  He reported that she has been increasingly weak with fever since last Thursday. 11:48 AM Husband has arrived.  He reports that patient was doing well when she first arrived home in early August.  Since that time she has had episodes of generalized weakness and decreased responsiveness.  He had not noted a fever prior to being told by EMS that she had a fever.  Reports that she has had generalized decreased p.o. intake but continues to eat and drink.  He denies any cough, shortness of breath, complaints of abdominal pain, nausea, vomiting, or diarrhea.  He states he is unable to tell me about UTI symptoms that she has been wearing a diaper but has not complained to him of anything.  Reports that she had an MRI on September 4.  MRI from September 4 shows continued expected evolution degree of bladder infarcts bilaterally, additional punctate areas of diffuse hyperintensity within the left caudate head and posterior left putamen likely representing areas of acute/subacute ischemia Most recent discharge reviewed from rehab reveals that patient was admitted in June with similar symptoms with fever to 101 and several days of weakness.  At that time.  She was given IV fluids and antibiotics.  She is also hypokalemic.  Right flank pain.  CT of the head showed multifocal hypoattenuation's and EEG with generalized slowing with  nonspecific etiology.  Abnormal MRI was obtained with question of subacute ischemia, tumor, tumor, infection, or toxoplasmosis.  While in the hospital she was treated with 5-day course of antibiotics and ended up having a ID consult.  LP performed which showed elevated protein and because culture negative.  Vasculitis work-up was obtained which is positive for ANA was treated with 3-day course of IV steroids.  She had radial cerebral angiogram negative for vasculitis, AVM, stenosis, dural AV fistula significant vascular normality.  Repeat brain MRI showed numerous small acute bilateral cerebral and cerebellar infarcts consistent with emboli and expected evolution of her right basal ganglia infarct.  TEE done 7 7 was negative for endocarditis that showed large patent PFO was not thought that this is likely etiology of her strokes.  Consideration was given to lymphoma but was eventually felt to be low probability.    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[place]) 06/08/2021   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   Cerebral thrombosis with cerebral infarction 05/27/2021   Numbness and tingling in right hand 09/08/2020   Rhabdomyolysis 08/18/2020   Hypokalemia 08/17/2020   Post herpetic neuralgia 06/30/2020   Left lumbar radiculopathy 06/04/2018   Memory difficulties 03/05/2018   Sialadenitis, right parotid 06/24/2017   Insomnia 05/12/2017    Prediabetes 11/08/2016   Vitamin D deficiency 06/08/2009   Depression 01/07/2009   PEPTIC ULCER DISEASE, CHRONIC 01/07/2009   OTHER OBSTRUCTION OF DUODENUM 01/07/2009   HIATAL HERNIA 01/07/2009   Irritable bowel syndrome 01/07/2009   DEGENERATIVE JOINT DISEASE 01/07/2009   ANEMIA, HX OF 01/07/2009   Right middle lobe pulmonary nodule 06/09/2008   Anxiety 04/01/2008   Essential hypertension 04/01/2008   Allergic rhinitis 04/01/2008   GERD 04/01/2008   LOW BACK PAIN SYNDROME 04/01/2008   Migraines 04/01/2008    Past Surgical History:  Procedure Laterality Date   ABDOMINAL HYSTERECTOMY     BUBBLE STUDY  06/03/2021   Procedure: BUBBLE STUDY;  Surgeon: Donato Heinz, MD;  Location: [facility] ENDOSCOPY;  Service: Cardiovascular;;   IR ANGIO EXTERNAL CAROTID SEL EXT CAROTID UNI R MOD SED  06/01/2021   IR ANGIO INTRA EXTRACRAN SEL COM CAROTID INNOMINATE UNI L MOD SED  06/01/2021   IR ANGIO INTRA EXTRACRAN SEL INTERNAL CAROTID UNI R MOD SED  06/01/2021   IR ANGIO VERTEBRAL SEL VERTEBRAL UNI R MOD SED  06/01/2021   IR US GUIDE VASC ACCESS RIGHT  06/01/2021   LAPAROSCOPIC BILATERAL SALPINGO OOPHERECTOMY     LOOP RECORDER INSERTION N/A 06/08/2021   Procedure: LOOP RECORDER INSERTION;  Surgeon: Constance Haw, MD;  Location: [place] [facility] LAB;  Service: Cardiovascular;  Laterality: N/A;   pud surgery w/duod sticture-plasty and vagotomy  2003   Dr. Marlou Starks   TEE WITHOUT CARDIOVERSION N/A 06/03/2021   Procedure: TRANSESOPHAGEAL ECHOCARDIOGRAM (TEE);  Surgeon: Donato Heinz, MD;  Location: [facility];  Service: Cardiovascular;  Laterality: N/A;   [place]     as a child   TUBAL LIGATION  1984     OB History   No obstetric history on file.     Family History  Problem Relation Age of Onset   Aneurysm Father    Prostate cancer Brother    Lung cancer Brother    Colon cancer Neg Hx    Esophageal cancer Neg Hx    Rectal cancer Neg Hx    Stomach cancer Neg Hx      Social History   Tobacco Use   Smoking status: Never   Smokeless tobacco: Never  Vaping Use   Vaping Use: Never used  Substance Use Topics   Alcohol use: Yes    Comment: rare   Drug use: No    Home Medications Prior to Admission medications   Medication Sig Start Date End Date Taking? Authorizing Provider  acetaminophen (TYLENOL) 650 MG CR tablet Take 1 tablet (650 mg total) by mouth every 8 (eight) hours as needed for pain. 06/30/21   Love, Ivan Anchors, PA-C  amLODipine (NORVASC) 5 MG tablet TAKE ONE TABLET BY MOUTH EVERY MORNING 07/17/21   Binnie Rail, MD  aspirin EC 81 MG EC tablet Take 1 tablet (81 mg total) by mouth daily. Swallow whole. 06/09/21   [place] Eric J, DO  buPROPion (WELLBUTRIN XL) 300 MG  24 hr tablet TAKE ONE TABLET BY MOUTH EVERY MORNING 01/29/21   Binnie Rail, MD  butalbital-acetaminophen-caffeine (FIORICET) [phone] MG tablet Take 1 tablet by mouth every 6 (six) hours as needed for headache or migraine. 06/30/21   Love, Ivan Anchors, PA-C  calcium carbonate (OS-CAL - DOSED IN MG OF ELEMENTAL CALCIUM) 1250 (500 Ca) MG tablet Take 1 tablet (500 mg of elemental calcium total) by mouth daily with breakfast. 06/09/21   [place] Donnamarie Poag, DO  Cholecalciferol (VITAMIN D3 PO) Take 1 tablet by mouth daily.    [provider]  EPINEPHrine (EPIPEN 2-PAK) 0.3 mg/0.3 mL IJ SOAJ injection Inject 0.3 mLs (0.3 mg total) into the muscle as needed for anaphylaxis. 08/15/19   Binnie Rail, MD  hydrALAZINE (APRESOLINE) 25 MG tablet TAKE ONE TABLET BY MOUTH EVERY MORNING and TAKE ONE TABLET BY MOUTH EVERYDAY AT BEDTIME 01/29/21   Burns, Claudina Lick, MD  LORazepam (ATIVAN) 1 MG tablet Take 1 tablet (1 mg total) by mouth 2 (two) times daily as needed. 04/29/21   Binnie Rail, MD  metoprolol succinate (TOPROL-XL) 100 MG 24 hr tablet TAKE ONE TABLET BY MOUTH EVERY MORNING WITH OR immediately following A meal 04/19/21   Burns, Claudina Lick, MD  Multiple Vitamin (MULTIVITAMIN WITH MINERALS) TABS  tablet Take 1 tablet by mouth daily. 07/01/21   Love, Ivan Anchors, PA-C  ondansetron (ZOFRAN) 4 MG tablet Take 1 tablet (4 mg total) by mouth every 6 (six) hours as needed for nausea. 08/19/20   Pokhrel, Corrie Mckusick, MD  pantoprazole (PROTONIX) 40 MG tablet Take 1 tablet (40 mg total) by mouth daily. 06/09/21   [place] Eric J, DO  potassium chloride (KLOR-CON) 10 MEQ tablet TAKE ONE TABLET BY MOUTH ONCE DAILY 07/26/21   Binnie Rail, MD  senna-docusate (SENOKOT-S) 8.6-50 MG tablet Take 2 tablets by mouth daily after supper. 07/01/21   Love, Ivan Anchors, PA-C  tiZANidine (ZANAFLEX) 4 MG tablet TAKE ONE TABLET BY MOUTH twice daily AS NEEDED FOR muscle SPASMS 06/30/21   Love, Ivan Anchors, PA-C  topiramate (TOPAMAX) 100 MG tablet TAKE ONE TABLET BY MOUTH TWICE DAILY 07/26/21   Binnie Rail, MD  venlafaxine XR (EFFEXOR-XR) 75 MG 24 hr capsule TAKE THREE CAPSULES BY MOUTH EVERY MORNING 01/29/21   Binnie Rail, MD  vitamin B-12 1000 MCG tablet Take 1 tablet (1,000 mcg total) by mouth daily. 06/09/21   [place] Eric J, DO  zolpidem (AMBIEN) 10 MG tablet TAKE ONE TABLET BY MOUTH EVERYDAY AT BEDTIME AS NEEDED FOR SLEEP 04/29/21   Binnie Rail, MD    Allergies    Amitriptyline hcl, Shellfish allergy, Amoxicillin, Cephalexin, Lisinopril, and Penicillins  Review of Systems   Review of Systems  Constitutional:  Positive for fever.  Neurological:  Positive for weakness.  All other systems reviewed and are negative.  Physical Exam Updated Vital Signs BP (!) 154/106 (BP Location: Right Arm)   Temp 98.8 F (37.1 C) (Oral)   Resp (!) 30   Ht 1.676 m ('5\' 6"'$ )   Wt 55.3 kg   SpO2 100%   BMI 19.69 kg/m   Physical Exam Vitals and nursing note reviewed.  Constitutional:      General: She is not in acute distress.    Appearance: She is ill-appearing.     Comments: Chronically ill-appearing female in bed somewhat pale  HENT:     Head: Normocephalic and atraumatic.     Right Ear: External ear normal.  Left Ear:  External ear normal.     Nose: Nose normal.     Mouth/Throat:     Mouth: Mucous membranes are dry.     Pharynx: Oropharynx is clear.  Eyes:     Pupils: Pupils are equal, round, and reactive to light.  Cardiovascular:     Rate and Rhythm: Regular rhythm. Tachycardia present.     Pulses: Normal pulses.  Pulmonary:     Effort: Pulmonary effort is normal.     Breath sounds: Normal breath sounds.  Abdominal:     General: Bowel sounds are normal.     Palpations: Abdomen is soft.  Musculoskeletal:        General: No swelling or tenderness. Normal range of motion.     Cervical back: Normal range of motion.  Skin:    General: Skin is warm and dry.     Capillary Refill: Capillary refill takes less than 2 seconds.     Coloration: Skin is pale.     Findings: No lesion or rash.  Neurological:     Mental Status: She is alert.    ED Results / Procedures / Treatments   Labs (all labs ordered are listed, but only abnormal results are displayed) Labs Reviewed  RESP PANEL BY RT-PCR (FLU A&B, COVID) ARPGX2  CULTURE, BLOOD (ROUTINE X 2)  CULTURE, BLOOD (ROUTINE X 2)  URINE CULTURE  LACTIC ACID, PLASMA  LACTIC ACID, PLASMA  COMPREHENSIVE METABOLIC PANEL  CBC WITH DIFFERENTIAL/PLATELET  PROTIME-INR  APTT  URINALYSIS, ROUTINE W REFLEX MICROSCOPIC  MISCELLANEOUS GENETIC TEST    EKG EKG Interpretation  Date/Time:  Tuesday August 10 2021 10:34:17 EDT Ventricular Rate:  101 PR Interval:  150 QRS Duration: 84 QT Interval:  346 QTC Calculation: 448 R Axis:   33 Text Interpretation: Sinus tachycardia Septal infarct , age undetermined Abnormal ECG Confirmed by Pattricia Boss [phone]) on 08/01/2021 11:10:08 AM  Radiology DG Chest Port 1 View  Result Date: 08/04/2021 CLINICAL DATA:  Concern for sepsis EXAM: PORTABLE CHEST 1 VIEW COMPARISON:  Chest x-Gelene Recktenwald dated July 18th 2022 FINDINGS: Cardiac and mediastinal contours are unchanged when accounting for differences in lung volumes. Left  basilar atelectasis. Lungs otherwise clear. No pleural effusion or pneumothorax. Loop recorder device noted. IMPRESSION: No active disease. Electronically Signed   By: Yetta Glassman M.D.   On: 08/26/2021 11:06    Procedures .Critical Care Performed by: Pattricia Boss, MD Authorized by: Pattricia Boss, MD   Critical care provider statement:    Critical care time (minutes):  60   Critical care was necessary to treat or prevent imminent or life-threatening deterioration of the following conditions:  CNS failure or compromise and sepsis   Critical care was time spent personally by me on the following activities:  Discussions with consultants, evaluation of patient's response to treatment, examination of patient, ordering and performing treatments and interventions, ordering and review of laboratory studies, ordering and review of radiographic studies, pulse oximetry, re-evaluation of patient's condition, obtaining history from patient or surrogate and review of old charts   Medications Ordered in ED Medications  lactated ringers infusion (has no administration in time range)  lactated ringers bolus 1,000 mL (has no administration in time range)    And  lactated ringers bolus 500 mL (has no administration in time range)    And  lactated ringers bolus 250 mL (has no administration in time range)  aztreonam (AZACTAM) 2 g in sodium chloride 0.9 % 100 mL IVPB (has no administration in  time range)  metroNIDAZOLE (FLAGYL) IVPB 500 mg (has no administration in time range)  vancomycin (VANCOCIN) IVPB 1000 mg/200 mL premix (has no administration in time range)  aztreonam (AZACTAM) 2 g in sodium chloride 0.9 % 100 mL IVPB (has no administration in time range)  vancomycin (VANCOREADY) IVPB 1250 mg/250 mL (has no administration in time range)    ED Course  I have reviewed the triage vital signs and the nursing notes.  Pertinent labs & imaging results that were available during my care of the patient  were reviewed by me and considered in my medical decision making (see chart for details).  Clinical Course as of 08/13/2021 1342  Tue Aug 10, 2021  1228 CBC reviewed and normal with the exception of thrombocytopenia noted Flu and COVID test negative First lactic acid is normal at 1.7 Mild hyponatremia with sodium of 130, CO2 decreased at 19 [DR]  1228 Chest x-Addiel Mccardle reviewed without evidence of acute infiltrate EKG reviewed and interpreted [DR]    Clinical Course User Index [DR] Pattricia Boss, MD   MDM Rules/Calculators/A&P                           Patient presents today with febrile illness and weakness.  Chest x-Evelen Vazguez, blood cultures, urinalysis, and labs ordered.  COVID swab ordered. 1-febrile illness-blood cultures obtained and broad-spectrum antibiotics initiated. Urinalysis with 21-50 white blood cells, trace LE and nitrite negative.  This is obtained via catheterization.  Is being sent for culture.  2- altered mental status/generalized weakness-reviewed most recent MRI of September 4 with acute to subacute infarcts.  Discussed care with neurology, Dr. Theda Sers, who will see in consult Final Clinical Impression(s) / ED Diagnoses Final diagnoses:  None    Rx / DC Orders ED Discharge Orders     None        Pattricia Boss, MD 08/17/2021 1343

## 2021-08-10 NOTE — Consult Note (Signed)
Neurology Consult H&P  CLARIS BIGBIE MR# [id number] 08/02/2021  CC: progressive weakness and fever.  History is obtained from: husband and chart  HPI: TACCARA WILTZ is a 70 y.o. female PMHx as reviewed below, recent stroke discharged 06/04/2021. After rehabilitation her husband said that there was incremental improvement. Last Thursday (08/05/2021) during therapy she was able to walk as well as at home however, the ensuing days she progressively developed generalized weakness to the point that she could not even sit up straight. Last night, she even had extreme difficulty swallowing and he brought her in for further evaluation. In ED she spiked fever and was started on empiric antibiotics.  She has a history of migraines since age 11 and has had continuing headaches over the last several years. She had a similar febrile presentation at last admission and she had extensive workup to rule out febrile illness, vasculitis, neurosarcoidosis. 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 TEE which showed PFO and Dr. Leonie Man felt the "ROPE score was 2 points indicating 0% chance that stroke is due to PFO." ILR placement was planned and Imitrex was switched to tompiramate '100mg'$  bid.  LKW: unclear tpa given: No OSW IR Thrombectomy No, Low NIHSS Modified Rankin Scale: 3-Moderate disability-requires help but walks WITHOUT assistance NIHSS: 4 LOC Responsiveness 0 LOC Questions 0 LOC Commands 0 Horizontal eye movement 0 Visual field 0 Facial palsy 0 Motor arm - Right arm 1 Motor arm - Left arm 1 Motor leg - Right leg 1 Motor leg - Left leg 1 Limb ataxia 0 Sensory test 0 Language 0 Speech 0 Extinction and inattention 0   ROS: A complete ROS was performed and is negative except as noted in the HPI.   Past Medical History:  Diagnosis Date   Anemia    Anxiety     Arthritis    Cataract    Chronic migraine    follows with neuro for same   Clotting disorder (HCC)    clot in ovarian vein- hx   Esophagitis    GERD (gastroesophageal reflux disease)    Hypertension    IBS (irritable bowel syndrome)    Low back pain syndrome    MRSA (methicillin resistant Staphylococcus aureus) infection 04/17/12   left torso   Peptic ulcer disease 2003, 12/2012   Peripheral vascular disease ([place])    Vitamin D deficiency      Family History  Problem Relation Age of Onset   Aneurysm Father    Prostate cancer Brother    Lung cancer Brother    Colon cancer Neg Hx    Esophageal cancer Neg Hx    Rectal cancer Neg Hx    Stomach cancer Neg Hx     Social History:  reports that she has never smoked. She has never used smokeless tobacco. She reports current alcohol use. She reports that she does not use drugs.   Prior to Admission medications   Medication Sig Start Date End Date Taking? Authorizing Provider  acetaminophen (TYLENOL) 650 MG CR tablet Take 1 tablet (650 mg total) by mouth every 8 (eight) hours as needed for pain. 06/30/21   Love, Ivan Anchors, PA-C  amLODipine (NORVASC) 5 MG tablet TAKE ONE TABLET BY MOUTH EVERY MORNING 07/17/21   Binnie Rail, MD  aspirin EC 81 MG EC tablet Take 1 tablet (81 mg total) by mouth daily. Swallow whole. 06/09/21   [place] Eric J, DO  buPROPion (WELLBUTRIN XL) 300  MG 24 hr tablet TAKE ONE TABLET BY MOUTH EVERY MORNING 01/29/21   Binnie Rail, MD  butalbital-acetaminophen-caffeine (FIORICET) [phone] MG tablet Take 1 tablet by mouth every 6 (six) hours as needed for headache or migraine. 06/30/21   Love, Ivan Anchors, PA-C  calcium carbonate (OS-CAL - DOSED IN MG OF ELEMENTAL CALCIUM) 1250 (500 Ca) MG tablet Take 1 tablet (500 mg of elemental calcium total) by mouth daily with breakfast. 06/09/21   [place] Donnamarie Poag, DO  Cholecalciferol (VITAMIN D3 PO) Take 1 tablet by mouth daily.    [provider]  EPINEPHrine (EPIPEN 2-PAK) 0.3  mg/0.3 mL IJ SOAJ injection Inject 0.3 mLs (0.3 mg total) into the muscle as needed for anaphylaxis. 08/15/19   Binnie Rail, MD  hydrALAZINE (APRESOLINE) 25 MG tablet TAKE ONE TABLET BY MOUTH EVERY MORNING and TAKE ONE TABLET BY MOUTH EVERYDAY AT BEDTIME 01/29/21   Burns, Claudina Lick, MD  LORazepam (ATIVAN) 1 MG tablet Take 1 tablet (1 mg total) by mouth 2 (two) times daily as needed. 04/29/21   Binnie Rail, MD  metoprolol succinate (TOPROL-XL) 100 MG 24 hr tablet TAKE ONE TABLET BY MOUTH EVERY MORNING WITH OR immediately following A meal 04/19/21   Burns, Claudina Lick, MD  Multiple Vitamin (MULTIVITAMIN WITH MINERALS) TABS tablet Take 1 tablet by mouth daily. 07/01/21   Love, Ivan Anchors, PA-C  ondansetron (ZOFRAN) 4 MG tablet Take 1 tablet (4 mg total) by mouth every 6 (six) hours as needed for nausea. 08/19/20   Pokhrel, Corrie Mckusick, MD  pantoprazole (PROTONIX) 40 MG tablet Take 1 tablet (40 mg total) by mouth daily. 06/09/21   [place] Eric J, DO  potassium chloride (KLOR-CON) 10 MEQ tablet TAKE ONE TABLET BY MOUTH ONCE DAILY 07/26/21   Binnie Rail, MD  senna-docusate (SENOKOT-S) 8.6-50 MG tablet Take 2 tablets by mouth daily after supper. 07/01/21   Love, Ivan Anchors, PA-C  tiZANidine (ZANAFLEX) 4 MG tablet TAKE ONE TABLET BY MOUTH twice daily AS NEEDED FOR muscle SPASMS 06/30/21   Love, Ivan Anchors, PA-C  topiramate (TOPAMAX) 100 MG tablet TAKE ONE TABLET BY MOUTH TWICE DAILY 07/26/21   Binnie Rail, MD  venlafaxine XR (EFFEXOR-XR) 75 MG 24 hr capsule TAKE THREE CAPSULES BY MOUTH EVERY MORNING 01/29/21   Binnie Rail, MD  vitamin B-12 1000 MCG tablet Take 1 tablet (1,000 mcg total) by mouth daily. 06/09/21   [place] Eric J, DO  zolpidem (AMBIEN) 10 MG tablet TAKE ONE TABLET BY MOUTH EVERYDAY AT BEDTIME AS NEEDED FOR SLEEP 04/29/21   Binnie Rail, MD    Exam: Current vital signs: BP (!) 162/93 (BP Location: Left Arm)   Pulse 100   Temp (!) 102 F (38.9 C) (Rectal)   Resp (!) 26   Ht '5\' 6"'$  (1.676 m)   Wt 55.3 kg    SpO2 95%   BMI 19.69 kg/m   Physical Exam  Constitutional: Appears well-developed and well-nourished.  Psych: Affect appropriate to situation Eyes: No scleral injection HENT: No OP obstruction. Head: Normocephalic.  Cardiovascular: Normal rate and regular rhythm.  Respiratory: Effort normal, symmetric excursions bilaterally, no audible wheezing. GI: Soft.  No distension. There is no tenderness.  Skin: WDI  Neuro: Mental Status: Patient is awake, alert, oriented to person, place, situation. Patient is able to give a somewhat clear and coherent history. Speech hypophonic bradylalia however fluent, intact comprehension and repetition. No signs of aphasia or neglect. Visual Fields are full. Pupils are equal, round,  and reactive to light. EOMI without ptosis or diploplia.  Facial sensation is symmetric to temperature Facial movement is symmetric.  Hearing is intact to voice. Uvula midline and palate elevates symmetrically. Shoulder shrug is symmetric. Tongue is midline without atrophy or fasciculations.  Tone is normal. Bulk is normal. 4-/5 strength right upper and lower, ~4-/5 left upper 3+/5 left lower. Sensation is symmetric to temperature in the arms and legs. Deep Tendon Reflexes: brisk throughout. Toes up on left right mute. FNF and HKS are intact bilaterally. Gait - Deferred  I have reviewed labs in epic and the pertinent results are: Results for CARELI, HOHENSTEIN (MRN [id number]) as of 08/09/2021 12:48  Ref. Range 08/03/2021  Sodium 135 - 145 mmol/L 130 (L)    I have reviewed the images obtained: MRI brain today showed Continued expected evolution of deep gray matter infarcts bilaterally. Additional punctate areas of diffusion hyperintensity are present within the left caudate head and posterior left putamen. These likely represent areas of acute/subacute ischemia. 3. Stable diffuse T2 signal change in the basal ganglia bilaterally and left greater than right thalami.    Assessment: AMIRE ABRAM is a 70 y.o. female PMHx as above recent stroke was discharged 06/04/2021 and incremental improvement with rehabilitation had recent progressive decline in function presented with generalized weakness and fever.  She is currently being followed by Dr. Leonie Man and and does a follow-up appointment in November and both she and her husband would like him to follow-up in the morning.  When she was last seen a few full was uncovered and there were plans for ILR as it was thought that PFO was not unlikely to be etiology based on rope score.  During the previous stroke admission, the patient underwent extensive workup for fever of unknown origin for autoimmune/praneoplastic which was unyielding.  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  Though central hyperthermia secondary to acute brain injury is a possibility, the associated ischemic strokes are generally larger and diffusion tensor imaging study would be helpful in patients show unexplained fever following brain injury.   Plan: - Continue statin. - Continue aspirin '81mg'$  daily. - SBP 140-160.  - Telemetry monitoring for arrhythmia. - Recommend bedside Swallow screen. - Recommend Stroke education. - Recommend PT/OT/SLP consult. - Stroke neurology will continue to follow.   Electronically signed by:  Lynnae Sandhoff, MD Page: [id number] 08/14/2021, 12:19 PM

## 2021-08-10 NOTE — ED Triage Notes (Signed)
"  Coming from home, spouse stated weakness since Thursday" per EMS

## 2021-08-10 NOTE — H&P (Signed)
History and Physical    Michaela Rios [id number] DOB: 04-Aug-1951 DOA: 08/14/2021  Referring MD/NP/PA: Pattricia Boss, MD PCP: Binnie Rail, MD  Patient coming from: Home via EMS  Chief Complaint: Weakness  I have personally briefly reviewed patient's old medical records in [place]   HPI: Michaela Rios is a 70 y.o. female with medical history significant of hypertension, CVA prediabetes, depression, CKD stage III, PVD, chronic low back pain presented with complaints of progressively worsening weakness.  History is obtained from the patient and review of records.  Patient had recently had a complicated hospitalization 6/27 -7/12 where patient had initially presented with fall and weakness and was incidentally found to be febrile meeting SIRS criteria.  An extensive work-up to evaluate the exact cause of the febrile illness.  Patient had undergone CT scan of the brain which showed multifocal hypoattenuations and subsequently underwent LP.  Cultures and studies were otherwise unremarkable. TEE which did not show signs of vegetation or endocarditis, but did note a PFO.  Patient also underwent CT renal angiogram which ruled out vasculitis, vascular occlusion, aneurysm, and AVM dural fistulous.  However, repeat MRI on 7/5 noted numerous small acute bilateral cerebral thought to be possibly periprocedural and less likely related to PFO.  Patient had a loop recorder placed and was recommended to come continue on dual antiplatelet therapy for least 3 weeks and then aspirin alone follow-up with neurology and have a repeat MRI in 1 month.  Patient had gone to rehab and seemed to be improving.  A repeat MRI of the brain has been performed on 9/4, which noted expected evolution of bilateral infarcts with additional areas of acute/subacute infarcts noted of the left caudate head and posterior left putamen.  Over the last 5 days or so the patient had progressively declined.  She went from being  able to walk to not being able to sit up on her own and he reports that she was just globally weak. At home her husband had not noticed that the patient had had a fever until picked up by EMS.  The patient does report having left-sided weakness and urinary frequency, but had been wearing depends.  Denies having any dysuria, nausea, vomiting, chest pain, or shortness of breath.   ED Course: Upon admission into the emergency department patient was seen to be febrile up to 102 F, pulse 99-1 03, respiration 20-30, and all other vital signs maintained.  Neurology have been consulted, but patient was not considered a candidate for tPA or thrombectomy.  Labs significant for WBC 6.4, platelets 402, sodium 130, BUN 12, creatinine 0.81,  and lactic acid 1.7.  Chest x-ray showed no acute abnormalities.  Urinalysis was significant for trace leukocytes, many bacteria, and 21-50 WBCs.  Sepsis protocol has been initiated with blood and urine cultures obtained.  Patient had been given full bolus of 1750 mL of lactated Ringer's, vancomycin, metronidazole, and aztreonam.  TRH called to admit. . Review of Systems  Constitutional:  Positive for malaise/fatigue. Negative for fever.  HENT:  Negative for ear discharge.   Eyes:  Negative for photophobia and pain.  Respiratory:  Negative for shortness of breath.   Cardiovascular:  Negative for chest pain and leg swelling.  Gastrointestinal:  Negative for abdominal pain, nausea and vomiting.  Genitourinary:  Positive for frequency. Negative for dysuria.  Musculoskeletal:  Negative for falls.  Neurological:  Positive for focal weakness, weakness and headaches.  Psychiatric/Behavioral:  Negative for substance abuse.  Past Medical History:  Diagnosis Date  . Anemia   . Anxiety   . Arthritis   . Cataract   . Chronic migraine    follows with neuro for same  . Clotting disorder (HCC)    clot in ovarian vein- hx  . Esophagitis   . GERD (gastroesophageal reflux  disease)   . Hypertension   . IBS (irritable bowel syndrome)   . Low back pain syndrome   . MRSA (methicillin resistant Staphylococcus aureus) infection 04/17/12   left torso  . Peptic ulcer disease 2003, 12/2012  . Peripheral vascular disease ([place])   . Vitamin D deficiency     Past Surgical History:  Procedure Laterality Date  . ABDOMINAL HYSTERECTOMY    . BUBBLE STUDY  06/03/2021   Procedure: BUBBLE STUDY;  Surgeon: Donato Heinz, MD;  Location: [facility] ENDOSCOPY;  Service: Cardiovascular;;  . IR ANGIO EXTERNAL CAROTID SEL EXT CAROTID UNI R MOD SED  06/01/2021  . IR ANGIO INTRA EXTRACRAN SEL COM CAROTID INNOMINATE UNI L MOD SED  06/01/2021  . IR ANGIO INTRA EXTRACRAN SEL INTERNAL CAROTID UNI R MOD SED  06/01/2021  . IR ANGIO VERTEBRAL SEL VERTEBRAL UNI R MOD SED  06/01/2021  . IR US GUIDE VASC ACCESS RIGHT  06/01/2021  . LAPAROSCOPIC BILATERAL SALPINGO OOPHERECTOMY    . LOOP RECORDER INSERTION N/A 06/08/2021   Procedure: LOOP RECORDER INSERTION;  Surgeon: Constance Haw, MD;  Location: [facility];  Service: Cardiovascular;  Laterality: N/A;  . pud surgery w/duod sticture-plasty and vagotomy  2003   Dr. Marlou Starks  . TEE WITHOUT CARDIOVERSION N/A 06/03/2021   Procedure: TRANSESOPHAGEAL ECHOCARDIOGRAM (TEE);  Surgeon: Donato Heinz, MD;  Location: [facility];  Service: Cardiovascular;  Laterality: N/A;  . TONSILLECTOMY AND ADENOIDECTOMY     as a child  . TUBAL LIGATION  1984     reports that she has never smoked. She has never used smokeless tobacco. She reports current alcohol use. She reports that she does not use drugs.  Allergies  Allergen Reactions  . Amitriptyline Hcl     Changed personality  . Shellfish Allergy Nausea And Vomiting and Other (See Comments)    Tested "high" on allergy test  . Amoxicillin Hives  . Cephalexin Hives  . Lisinopril Cough    .  Marland Kitchen Penicillins Hives    Family History  Problem Relation Age of Onset  . Aneurysm Father   .  Prostate cancer Brother   . Lung cancer Brother   . Colon cancer Neg Hx   . Esophageal cancer Neg Hx   . Rectal cancer Neg Hx   . Stomach cancer Neg Hx     Prior to Admission medications   Medication Sig Start Date End Date Taking? Authorizing Provider  acetaminophen (TYLENOL) 650 MG CR tablet Take 1 tablet (650 mg total) by mouth every 8 (eight) hours as needed for pain. 06/30/21   Love, Ivan Anchors, PA-C  amLODipine (NORVASC) 5 MG tablet TAKE ONE TABLET BY MOUTH EVERY MORNING 07/17/21   Binnie Rail, MD  aspirin EC 81 MG EC tablet Take 1 tablet (81 mg total) by mouth daily. Swallow whole. 06/09/21   [place] Eric J, DO  buPROPion (WELLBUTRIN XL) 300 MG 24 hr tablet TAKE ONE TABLET BY MOUTH EVERY MORNING 01/29/21   Burns, Claudina Lick, MD  butalbital-acetaminophen-caffeine (FIORICET) [phone] MG tablet Take 1 tablet by mouth every 6 (six) hours as needed for headache or migraine. 06/30/21   Love,  Ivan Anchors, PA-C  calcium carbonate (OS-CAL - DOSED IN MG OF ELEMENTAL CALCIUM) 1250 (500 Ca) MG tablet Take 1 tablet (500 mg of elemental calcium total) by mouth daily with breakfast. 06/09/21   [place] Donnamarie Poag, DO  Cholecalciferol (VITAMIN D3 PO) Take 1 tablet by mouth daily.    [provider]  EPINEPHrine (EPIPEN 2-PAK) 0.3 mg/0.3 mL IJ SOAJ injection Inject 0.3 mLs (0.3 mg total) into the muscle as needed for anaphylaxis. 08/15/19   Binnie Rail, MD  hydrALAZINE (APRESOLINE) 25 MG tablet TAKE ONE TABLET BY MOUTH EVERY MORNING and TAKE ONE TABLET BY MOUTH EVERYDAY AT BEDTIME 01/29/21   Burns, Claudina Lick, MD  LORazepam (ATIVAN) 1 MG tablet Take 1 tablet (1 mg total) by mouth 2 (two) times daily as needed. 04/29/21   Binnie Rail, MD  metoprolol succinate (TOPROL-XL) 100 MG 24 hr tablet TAKE ONE TABLET BY MOUTH EVERY MORNING WITH OR immediately following A meal 04/19/21   Burns, Claudina Lick, MD  Multiple Vitamin (MULTIVITAMIN WITH MINERALS) TABS tablet Take 1 tablet by mouth daily. 07/01/21   Love, Ivan Anchors, PA-C   ondansetron (ZOFRAN) 4 MG tablet Take 1 tablet (4 mg total) by mouth every 6 (six) hours as needed for nausea. 08/19/20   Pokhrel, Corrie Mckusick, MD  pantoprazole (PROTONIX) 40 MG tablet Take 1 tablet (40 mg total) by mouth daily. 06/09/21   [place] Eric J, DO  potassium chloride (KLOR-CON) 10 MEQ tablet TAKE ONE TABLET BY MOUTH ONCE DAILY 07/26/21   Binnie Rail, MD  senna-docusate (SENOKOT-S) 8.6-50 MG tablet Take 2 tablets by mouth daily after supper. 07/01/21   Love, Ivan Anchors, PA-C  tiZANidine (ZANAFLEX) 4 MG tablet TAKE ONE TABLET BY MOUTH twice daily AS NEEDED FOR muscle SPASMS 06/30/21   Love, Ivan Anchors, PA-C  topiramate (TOPAMAX) 100 MG tablet TAKE ONE TABLET BY MOUTH TWICE DAILY 07/26/21   Binnie Rail, MD  venlafaxine XR (EFFEXOR-XR) 75 MG 24 hr capsule TAKE THREE CAPSULES BY MOUTH EVERY MORNING 01/29/21   Binnie Rail, MD  vitamin B-12 1000 MCG tablet Take 1 tablet (1,000 mcg total) by mouth daily. 06/09/21   [place] Donnamarie Poag, DO  zolpidem (AMBIEN) 10 MG tablet TAKE ONE TABLET BY MOUTH EVERYDAY AT BEDTIME AS NEEDED FOR SLEEP 04/29/21   Binnie Rail, MD    Physical Exam:  Constitutional: Elderly female who appears Vitals:   08/01/2021 1200 08/01/2021 1215 08/23/2021 1230 08/04/2021 1245  BP: (!) 148/80 (!) 148/82 (!) 159/96 140/85  Pulse: (!) 101 (!) 102 99 99  Resp:      Temp:      TempSrc:      SpO2: 91% 100% 99% 98%  Weight:      Height:       Eyes: PERRL, lids and conjunctivae normal ENMT: Mucous membranes are moist. Posterior pharynx clear of any exudate or lesions.Normal dentition.  Neck: normal, supple, no masses, no thyromegaly Respiratory: clear to auscultation bilaterally, no wheezing, no crackles. Normal respiratory effort. No accessory muscle use.  Cardiovascular: Regular rate and rhythm, no murmurs / rubs / gallops. No extremity edema. 2+ pedal pulses. No carotid bruits.  Abdomen: no tenderness, no masses palpated. No hepatosplenomegaly. Bowel sounds positive.  Musculoskeletal: no  clubbing / cyanosis. No joint deformity upper and lower extremities. Good ROM, no contractures. Normal muscle tone.  Skin: no rashes, lesions, ulcers. No induration Neurologic: CN 2-12 grossly intact.  Strength 4/5 in right upper and lower extremity. Strength is  4/5 left upper extremity and 3+/5 in the left lower extremity. Psychiatric: Patient is alert, but slowed in responses often stares off.   Labs on Admission: I have personally reviewed following labs and imaging studies  CBC: Recent Labs  Lab 08/06/2021 1039  WBC 6.4  NEUTROABS 4.9  HGB 12.1  HCT 36.5  MCV 85.3  PLT [id number]   Basic Metabolic Panel: Recent Labs  Lab 08/20/2021 1039  NA 130*  K 4.0  CL 100  CO2 19*  GLUCOSE 103*  BUN 12  CREATININE 0.81  CALCIUM 8.5*   GFR: Estimated Creatinine Clearance: 56.4 mL/min (by C-G formula based on SCr of 0.81 mg/dL). Liver Function Tests: Recent Labs  Lab 08/09/2021 1039  AST 26  ALT 12  ALKPHOS 66  BILITOT 0.8  PROT 7.0  ALBUMIN 2.9*   No results for input(s): LIPASE, AMYLASE in the last 168 hours. No results for input(s): AMMONIA in the last 168 hours. Coagulation Profile: Recent Labs  Lab 08/01/2021 1039  INR 1.0   Cardiac Enzymes: No results for input(s): CKTOTAL, CKMB, CKMBINDEX, TROPONINI in the last 168 hours. BNP (last 3 results) No results for input(s): PROBNP in the last 8760 hours. HbA1C: No results for input(s): HGBA1C in the last 72 hours. CBG: No results for input(s): GLUCAP in the last 168 hours. Lipid Profile: No results for input(s): CHOL, HDL, LDLCALC, TRIG, CHOLHDL, LDLDIRECT in the last 72 hours. Thyroid Function Tests: No results for input(s): TSH, T4TOTAL, FREET4, T3FREE, THYROIDAB in the last 72 hours. Anemia Panel: No results for input(s): VITAMINB12, FOLATE, FERRITIN, TIBC, IRON, RETICCTPCT in the last 72 hours. Urine analysis:    Component Value Date/Time   COLORURINE YELLOW 08/18/2021 1156   APPEARANCEUR CLOUDY (A) 08/08/2021 1156    LABSPEC 1.013 08/09/2021 1156   PHURINE 7.0 07/29/2021 1156   GLUCOSEU NEGATIVE 08/18/2021 1156   GLUCOSEU NEGATIVE 04/07/2021 1450   HGBUR SMALL (A) 08/02/2021 1156   BILIRUBINUR NEGATIVE 08/08/2021 1156   BILIRUBINUR negative 09/29/2020 1445   KETONESUR 5 (A) 08/14/2021 1156   PROTEINUR 30 (A) 08/25/2021 1156   UROBILINOGEN 0.2 04/07/2021 1450   NITRITE NEGATIVE 08/25/2021 1156   LEUKOCYTESUR TRACE (A) 08/05/2021 1156   Sepsis Labs: Recent Results (from the past 240 hour(s))  Resp Panel by RT-PCR (Flu A&B, Covid) Nasopharyngeal Swab     Status: None   Collection Time: 08/11/2021 10:39 AM   Specimen: Nasopharyngeal Swab; Nasopharyngeal(NP) swabs in vial transport medium  Result Value Ref Range Status   SARS Coronavirus 2 by RT PCR NEGATIVE NEGATIVE Final    Comment: (NOTE) SARS-CoV-2 target nucleic acids are NOT DETECTED.  The SARS-CoV-2 RNA is generally detectable in upper respiratory specimens during the acute phase of infection. The lowest concentration of SARS-CoV-2 viral copies this assay can detect is 138 copies/mL. A negative result does not preclude SARS-Cov-2 infection and should not be used as the sole basis for treatment or other patient management decisions. A negative result may occur with  improper specimen collection/handling, submission of specimen other than nasopharyngeal swab, presence of viral mutation(s) within the areas targeted by this assay, and inadequate number of viral copies(<138 copies/mL). A negative result must be combined with clinical observations, patient history, and epidemiological information. The expected result is Negative.  Fact Sheet for Patients:  EntrepreneurPulse.com.au  Fact Sheet for Healthcare Providers:  IncredibleEmployment.be  This test is no t yet approved or cleared by the [place] FDA and  has been authorized for detection and/or diagnosis of  SARS-CoV-2 by FDA under an Emergency  Use Authorization (EUA). This EUA will remain  in effect (meaning this test can be used) for the duration of the COVID-19 declaration under Section 564(b)(1) of the Act, 21 U.S.C.section 360bbb-3(b)(1), unless the authorization is terminated  or revoked sooner.       Influenza A by PCR NEGATIVE NEGATIVE Final   Influenza B by PCR NEGATIVE NEGATIVE Final    Comment: (NOTE) The Xpert Xpress SARS-CoV-2/FLU/RSV plus assay is intended as an aid in the diagnosis of influenza from Nasopharyngeal swab specimens and should not be used as a sole basis for treatment. Nasal washings and aspirates are unacceptable for Xpert Xpress SARS-CoV-2/FLU/RSV testing.  Fact Sheet for Patients: EntrepreneurPulse.com.au  Fact Sheet for Healthcare Providers: IncredibleEmployment.be  This test is not yet approved or cleared by the [place] FDA and has been authorized for detection and/or diagnosis of SARS-CoV-2 by FDA under an Emergency Use Authorization (EUA). This EUA will remain in effect (meaning this test can be used) for the duration of the COVID-19 declaration under Section 564(b)(1) of the Act, 21 U.S.C. section 360bbb-3(b)(1), unless the authorization is terminated or revoked.  Performed at [facility], [place] [address] [place], [place] [address]      Radiological Exams on Admission: DG Chest Port 1 View  Result Date: 08/16/2021 CLINICAL DATA:  Concern for sepsis EXAM: PORTABLE CHEST 1 VIEW COMPARISON:  Chest x-ray dated July 18th 2022 FINDINGS: Cardiac and mediastinal contours are unchanged when accounting for differences in lung volumes. Left basilar atelectasis. Lungs otherwise clear. No pleural effusion or pneumothorax. Loop recorder device noted. IMPRESSION: No active disease. Electronically Signed   By: Yetta Glassman M.D.   On: 08/07/2021 11:06    EKG: Independently reviewed. Sinus   Assessment/Plan Sepsis secondary to suspected urinary  tract infection: Patient presents after being found to be febrile up to 102 F with tachycardia and tachypnea.  Lactic acid was noted to be within normal limits.   She had initially been given empiric antibiotics of vancomycin, aztreonam, and metronidazole.  Source of infection thought to be possibly urine after urinalysis was noted to be significant for trace leukocytes with many bacteria, and 21-50 WBCs. During last hospitalization patient was also noted to have fever and underwent extensive testing without a clear cause of symptoms noted. -Admit to medical telemetry bed -Follow-up blood and urine cultures -Continue aztreonam and vancomycin -De-escalate antibiotics when medically appropriate -Pharmacy consult for review of medications for possibility of induced effect  History of recent CVA: Patient noted to have acute/subacute left posterior putamen and caudate head on MRI on 9/4. -Neurochecks -Continue aspirin and statin -Have loop recorder interrogated -PT/OT/speech to evaluate and treat -Appreciate neurology consultative services, we will follow-up for any further recommendations  Hyponatremia: Acute.  Sodium 130 on admission. This could be related with patient being on furosemide. -Held furosemide  Essential hypertension: Current Home regimen includes amlodipine 5 mg daily, metoprolol succinate 100 mg daily, hydralazine 25 mg twice daily, furosemide 20 mg daily -Held furosemide -Continue metoprolol, hydralazine, and amlodipine  Anxiety and Depression: Home medications include Ativan as needed, Wellbutrin 300 mg daily, and Effexor 150 mg qam and 75 qhs.  Question the possibility of patient's Wellbutrin and Effexor leading to possible serotonin syndrome, but pharmacy felt less likely. Pharm Zanaflex was on the med list as prn which can cause rebound symptoms like hypertension/tachycardia and possibly fever if abruptly discontinued.  -Continue Wellbutrin, Effexor, and Ativan as  needed  Migraines  -  Continue Topamax  GERD -Continue Protonix  DVT prophylaxis: Lovenox Code Status: Full Family Communication: Husband updated over the phone Disposition Plan: To be determined Consults called: Neurology Admission status: Inpatient, likely require more than 2 midnights and work-ups of sepsis  Norval Morton MD [facility]   If 7PM-7AM, please contact night-coverage   08/02/2021, 12:57 PM

## 2021-08-10 NOTE — Progress Notes (Signed)
Elink following for code sepsis 

## 2021-08-10 NOTE — ED Notes (Signed)
Did in and out cath on patient

## 2021-08-10 NOTE — ED Notes (Signed)
Patient care taken, pt is feeling hot to touch, temp is 99.9

## 2021-08-11 DIAGNOSIS — G43909 Migraine, unspecified, not intractable, without status migrainosus: Secondary | ICD-10-CM

## 2021-08-11 DIAGNOSIS — I6340 Cerebral infarction due to embolism of unspecified cerebral artery: Secondary | ICD-10-CM | POA: Insufficient documentation

## 2021-08-11 DIAGNOSIS — I639 Cerebral infarction, unspecified: Secondary | ICD-10-CM

## 2021-08-11 DIAGNOSIS — R531 Weakness: Secondary | ICD-10-CM

## 2021-08-11 DIAGNOSIS — I6310 Cerebral infarction due to embolism of unspecified precerebral artery: Secondary | ICD-10-CM | POA: Diagnosis not present

## 2021-08-11 LAB — CBC
HCT: 32.2 % — ABNORMAL LOW (ref 36.0–46.0)
Hemoglobin: 10.5 g/dL — ABNORMAL LOW (ref 12.0–15.0)
MCH: 27.7 pg (ref 26.0–34.0)
MCHC: 32.6 g/dL (ref 30.0–36.0)
MCV: 85 fL (ref 80.0–100.0)
Platelets: 313 10*3/uL (ref 150–400)
RBC: 3.79 MIL/uL — ABNORMAL LOW (ref 3.87–5.11)
RDW: 15.6 % — ABNORMAL HIGH (ref 11.5–15.5)
WBC: 6.1 10*3/uL (ref 4.0–10.5)
nRBC: 0.3 % — ABNORMAL HIGH (ref 0.0–0.2)

## 2021-08-11 LAB — BASIC METABOLIC PANEL
Anion gap: 9 (ref 5–15)
BUN: 11 mg/dL (ref 8–23)
CO2: 20 mmol/L — ABNORMAL LOW (ref 22–32)
Calcium: 8 mg/dL — ABNORMAL LOW (ref 8.9–10.3)
Chloride: 103 mmol/L (ref 98–111)
Creatinine, Ser: 0.77 mg/dL (ref 0.44–1.00)
GFR, Estimated: >60 mL/min (ref >60)
Glucose, Bld: 101 mg/dL — ABNORMAL HIGH (ref 70–99)
Potassium: 2.7 mmol/L — CL (ref 3.5–5.1)
Sodium: 132 mmol/L — ABNORMAL LOW (ref 135–145)

## 2021-08-11 LAB — POTASSIUM: Potassium: 3 mmol/L — ABNORMAL LOW (ref 3.5–5.1)

## 2021-08-11 LAB — MAGNESIUM: Magnesium: 1.8 mg/dL (ref 1.7–2.4)

## 2021-08-11 MED ORDER — POTASSIUM CHLORIDE CRYS ER 20 MEQ PO TBCR
40.0000 meq | EXTENDED_RELEASE_TABLET | Freq: Two times a day (BID) | ORAL | Status: AC
  Administered 2021-08-11 (×2): 40 meq via ORAL
  Filled 2021-08-11 (×2): qty 2

## 2021-08-11 MED ORDER — LACTATED RINGERS IV SOLN: INTRAVENOUS | Status: DC

## 2021-08-11 MED ORDER — CLOPIDOGREL BISULFATE 75 MG PO TABS
75.0000 mg | ORAL_TABLET | Freq: Every day | ORAL | Status: DC
  Administered 2021-08-11 – 2021-08-18 (×8): 75 mg via ORAL
  Filled 2021-08-11 (×8): qty 1

## 2021-08-11 NOTE — ED Notes (Signed)
Spoke to [employer] and told him the potassium level, he advised to pull another for verification, and to pull a magnesium level

## 2021-08-11 NOTE — ED Notes (Signed)
Pt care taken, pt resting, is able to answer questions, does not move much

## 2021-08-11 NOTE — Progress Notes (Signed)
[facility] PROGRESS NOTE    Michaela Rios  [id number] DOB: September 26, 1951 DOA: 08/19/2021 PCP: Binnie Rail, MD      Brief Narrative:  Michaela Rios is a 70 y.o. F with HTN, CKD IIIa, and PVD as well as recent admission for stroke and FUO presented with again fever and weakness.  Patient presented 2 months ago with weakness, fall, urinary irritative symptoms and flank pain, found to have HR 140 and Temp 101.1F, admitted for sepsis.  In the hospital, after 2 days had confusion out of proportion to resolving sepsis, MRI brain showed a right BG stroke but also hyperintensity of the head of the caudate, left thalamus and vasogenic edema with a broader differential including infection, neoplasm, or autoimmune disease.  Neurology and ID were consulted.    LP with 2 WBC in tube 4.  HSV PCR on CSF negative.  Crypto antigen on CSF negative.  Protein high and RBCs still 22 in tube 4.  Further work up for systemic or CNS vasculitis, including cerebral angiography was negative. Antibiotics were held and fever abated.   Discharged to CIR.    Since returning to home, patient had about a week of feeling well, ambulating, no fever, then began to feel sick again, weak, tired, malaise.  Returned to the ER, where she was febrile, tachycardic, but WBC normal, lactate normal, CXR normal.  UA with bacteria, started on broad spectrum antibiotics and admitted.     Assessment & Plan:  Fever Unclear cause.  Urine with Klebsiella.  Low suspicion for Meningitis given time course. - Continue Meropenem - Follow urine and blood cultures and senstivities    Stroke -Continue aspirin, Plavix - Obtain LE dopllers, TCD study, and Loop interrogation -Start statin  Hypertension BP normal -Continue amlodipine, hydralazine, metoprolol  CKD IIIa  CHronic migraines -Continue venlafaxine -Continue topiramate, bupropion  Hyponatremia Mild asymptomatic  Hypokalemia - Supplement  K  Anemia, normocytic      Disposition: Status is: Inpatient  Remains inpatient appropriate because:IV treatments appropriate due to intensity of illness or inability to take PO  Dispo: The patient is from: Home              Anticipated d/c is to: SNF              Patient currently is not medically stable to d/c.   Difficult to place patient No       Level of care: Med-Surg       MDM: The below labs and imaging reports were reviewed and summarized above.  Medication management as above.    DVT prophylaxis: enoxaparin (LOVENOX) injection 40 mg Start: 08/16/2021 1600  Code Status: FULL Family Communication: Husband    Consultants:     Procedures:     Antimicrobials:      Culture data:              Subjective: Feeling better but still very weak and tired.  Stiff neck, torticollis to the left.  No cough, respiratory symptoms, no suprapubic pain.  Objective: Vitals:   08/11/21 1426 08/11/21 1500 08/11/21 1514 08/11/21 1645  BP: 108/73 115/77 115/77 114/71  Pulse:  81 81 75  Resp:   17   Temp:   98 F (36.7 C)   TempSrc:   Oral   SpO2:  99% 99% 100%  Weight:      Height:        Intake/Output Summary (Last 24 hours) at 08/11/2021 1730 Last data  filed at 08/11/2021 1615 Gross per 24 hour  Intake 696.26 ml  Output 1000 ml  Net -303.74 ml   Filed Weights   08/24/2021 1020  Weight: 55.3 kg    Examination: General appearance:  adult female, awake but tired. sluggish HEENT: Anicteric, conjunctiva pink, lids and lashes normal. No nasal deformity, discharge, epistaxis.  Lips moist, dentition in good repair, OP moist, no oral lesions hearing normal.   Skin: Warm and dry.  no jaundice.  No suspicious rashes or lesions. Cardiac: RRR, nl S1-S2, no murmurs appreciated.  Capillary refill is brisk.  JVP normal.  No LE edema.  Radial  pulses 2+ and symmetric. Respiratory: Normal respiratory rate and rhythm.  CTAB without rales or wheezes. Abdomen:  Abdomen soft.  No TTP. No ascites, distension, hepatosplenomegaly.   MSK: No deformities or effusions. Neuro: Awake and responds to questions appropriately.  EOMI, moves all extremities with generalized weakness. Speech fluent.    Psych: Sensorium intact and responding to questions, psychomotor slowing noted, attention diminished, affect blunted     Data Reviewed: I have personally reviewed following labs and imaging studies:  CBC: Recent Labs  Lab 07/30/2021 1039 08/11/21 0420  WBC 6.4 6.1  NEUTROABS 4.9  --   HGB 12.1 10.5*  HCT 36.5 32.2*  MCV 85.3 85.0  PLT 402* [id number]   Basic Metabolic Panel: Recent Labs  Lab 08/11/2021 1039 08/11/21 0420 08/11/21 0706  NA 130* 132*  --   K 4.0 2.7* 3.0*  CL 100 103  --   CO2 19* 20*  --   GLUCOSE 103* 101*  --   BUN 12 11  --   CREATININE 0.81 0.77  --   CALCIUM 8.5* 8.0*  --   MG  --   --  1.8   GFR: Estimated Creatinine Clearance: 57.1 mL/min (by C-G formula based on SCr of 0.77 mg/dL). Liver Function Tests: Recent Labs  Lab 08/15/2021 1039  AST 26  ALT 12  ALKPHOS 66  BILITOT 0.8  PROT 7.0  ALBUMIN 2.9*   No results for input(s): LIPASE, AMYLASE in the last 168 hours. No results for input(s): AMMONIA in the last 168 hours. Coagulation Profile: Recent Labs  Lab 08/04/2021 1039  INR 1.0   Cardiac Enzymes: No results for input(s): CKTOTAL, CKMB, CKMBINDEX, TROPONINI in the last 168 hours. BNP (last 3 results) No results for input(s): PROBNP in the last 8760 hours. HbA1C: No results for input(s): HGBA1C in the last 72 hours. CBG: No results for input(s): GLUCAP in the last 168 hours. Lipid Profile: No results for input(s): CHOL, HDL, LDLCALC, TRIG, CHOLHDL, LDLDIRECT in the last 72 hours. Thyroid Function Tests: No results for input(s): TSH, T4TOTAL, FREET4, T3FREE, THYROIDAB in the last 72 hours. Anemia Panel: No results for input(s): VITAMINB12, FOLATE, FERRITIN, TIBC, IRON, RETICCTPCT in the last 72 hours. Urine  analysis:    Component Value Date/Time   COLORURINE YELLOW 08/14/2021 1156   APPEARANCEUR CLOUDY (A) 08/08/2021 1156   LABSPEC 1.013 08/22/2021 1156   PHURINE 7.0 07/29/2021 1156   GLUCOSEU NEGATIVE 08/03/2021 1156   GLUCOSEU NEGATIVE 04/07/2021 1450   HGBUR SMALL (A) 08/05/2021 1156   BILIRUBINUR NEGATIVE 08/09/2021 1156   BILIRUBINUR negative 09/29/2020 1445   KETONESUR 5 (A) 08/23/2021 1156   PROTEINUR 30 (A) 08/09/2021 1156   UROBILINOGEN 0.2 04/07/2021 1450   NITRITE NEGATIVE 07/30/2021 1156   LEUKOCYTESUR TRACE (A) 08/18/2021 1156   Sepsis Labs: '@LABRCNTIP'$ (procalcitonin:4,lacticacidven:4)  ) Recent Results (from the past 240 hour(s))  Urine Culture     Status: Abnormal (Preliminary result)   Collection Time: 08/02/2021 10:27 AM   Specimen: In/Out Cath Urine  Result Value Ref Range Status   Specimen Description IN/OUT CATH URINE  Final   Special Requests NONE  Final   Culture (A)  Final    30,000 COLONIES/mL KLEBSIELLA PNEUMONIAE SUSCEPTIBILITIES TO FOLLOW Performed at [facility], [place] [address], [place], [place] [address]    Report Status PENDING  Incomplete  Resp Panel by RT-PCR (Flu A&B, Covid) Nasopharyngeal Swab     Status: None   Collection Time: 08/16/2021 10:39 AM   Specimen: Nasopharyngeal Swab; Nasopharyngeal(NP) swabs in vial transport medium  Result Value Ref Range Status   SARS Coronavirus 2 by RT PCR NEGATIVE NEGATIVE Final    Comment: (NOTE) SARS-CoV-2 target nucleic acids are NOT DETECTED.  The SARS-CoV-2 RNA is generally detectable in upper respiratory specimens during the acute phase of infection. The lowest concentration of SARS-CoV-2 viral copies this assay can detect is 138 copies/mL. A negative result does not preclude SARS-Cov-2 infection and should not be used as the sole basis for treatment or other patient management decisions. A negative result may occur with  improper specimen collection/handling, submission of specimen other than  nasopharyngeal swab, presence of viral mutation(s) within the areas targeted by this assay, and inadequate number of viral copies(<138 copies/mL). A negative result must be combined with clinical observations, patient history, and epidemiological information. The expected result is Negative.  Fact Sheet for Patients:  EntrepreneurPulse.com.au  Fact Sheet for Healthcare Providers:  IncredibleEmployment.be  This test is no t yet approved or cleared by the [place] FDA and  has been authorized for detection and/or diagnosis of SARS-CoV-2 by FDA under an Emergency Use Authorization (EUA). This EUA will remain  in effect (meaning this test can be used) for the duration of the COVID-19 declaration under Section 564(b)(1) of the Act, 21 U.S.C.section 360bbb-3(b)(1), unless the authorization is terminated  or revoked sooner.       Influenza A by PCR NEGATIVE NEGATIVE Final   Influenza B by PCR NEGATIVE NEGATIVE Final    Comment: (NOTE) The Xpert Xpress SARS-CoV-2/FLU/RSV plus assay is intended as an aid in the diagnosis of influenza from Nasopharyngeal swab specimens and should not be used as a sole basis for treatment. Nasal washings and aspirates are unacceptable for Xpert Xpress SARS-CoV-2/FLU/RSV testing.  Fact Sheet for Patients: EntrepreneurPulse.com.au  Fact Sheet for Healthcare Providers: IncredibleEmployment.be  This test is not yet approved or cleared by the [place] FDA and has been authorized for detection and/or diagnosis of SARS-CoV-2 by FDA under an Emergency Use Authorization (EUA). This EUA will remain in effect (meaning this test can be used) for the duration of the COVID-19 declaration under Section 564(b)(1) of the Act, 21 U.S.C. section 360bbb-3(b)(1), unless the authorization is terminated or revoked.  Performed at [facility], [place] [address], [place], [place] [address]    Blood Culture (routine x 2)     Status: None (Preliminary result)   Collection Time: 07/31/2021 10:39 AM   Specimen: BLOOD RIGHT HAND  Result Value Ref Range Status   Specimen Description BLOOD RIGHT HAND  Final   Special Requests   Final    BOTTLES DRAWN AEROBIC ONLY Blood Culture results may not be optimal due to an inadequate volume of blood received in culture bottles   Culture   Final    NO GROWTH < 24 HOURS Performed at [facility], [place] [address] [place], [place] [address]    Report Status PENDING  Incomplete  Blood Culture (routine x 2)     Status: None (Preliminary result)   Collection Time: 08/16/2021 10:39 AM   Specimen: BLOOD  Result Value Ref Range Status   Specimen Description BLOOD RIGHT ANTECUBITAL  Final   Special Requests   Final    BOTTLES DRAWN AEROBIC AND ANAEROBIC Blood Culture adequate volume   Culture   Final    NO GROWTH < 24 HOURS Performed at [facility], [place] [address], [place],  [address]    Report Status PENDING  Incomplete         Radiology Studies: DG Chest Port 1 View  Result Date: 08/06/2021 CLINICAL DATA:  Concern for sepsis EXAM: PORTABLE CHEST 1 VIEW COMPARISON:  Chest x-ray dated July 18th 2022 FINDINGS: Cardiac and mediastinal contours are unchanged when accounting for differences in lung volumes. Left basilar atelectasis. Lungs otherwise clear. No pleural effusion or pneumothorax. Loop recorder device noted. IMPRESSION: No active disease. Electronically Signed   By: Yetta Glassman M.D.   On: 08/08/2021 11:06        Scheduled Meds:  amLODipine  5 mg Oral Daily   aspirin EC  81 mg Oral Daily   buPROPion  300 mg Oral Daily   clopidogrel  75 mg Oral Daily   enoxaparin (LOVENOX) injection  40 mg Subcutaneous Q24H   hydrALAZINE  25 mg Oral BID   metoprolol succinate  100 mg Oral Daily   potassium chloride  40 mEq Oral BID   sodium chloride flush  3 mL Intravenous Q12H   topiramate  100 mg Oral BID   venlafaxine  XR  150 mg Oral Q breakfast   And   venlafaxine XR  75 mg Oral QHS   cyanocobalamin  1,000 mcg Oral Daily   Continuous Infusions:  lactated ringers 75 mL/hr at 08/11/21 1655     LOS: 1 day    Time spent: 25 minutes    Edwin Dada, MD [facility] 08/11/2021, 5:30 PM     Please page though [facility] or Epic secure chat:  For [employer], Adult nurse

## 2021-08-11 NOTE — Evaluation (Signed)
Clinical/Bedside Swallow Evaluation Patient Details  Name: Michaela Rios MRN: [id number] Date of Birth: [birth year]  Today's Date: 08/11/2021 Time: SLP Start Time (ACUTE ONLY): 28 SLP Stop Time (ACUTE ONLY): [id number] SLP Time Calculation (min) (ACUTE ONLY): 15 min  Past Medical History:  Past Medical History:  Diagnosis Date   Anemia    Anxiety    Arthritis    Cataract    Chronic migraine    follows with neuro for same   Clotting disorder (HCC)    clot in ovarian vein- hx   Esophagitis    GERD (gastroesophageal reflux disease)    Hypertension    IBS (irritable bowel syndrome)    Low back pain syndrome    MRSA (methicillin resistant Staphylococcus aureus) infection 04/17/12   left torso   Peptic ulcer disease 2003, 12/2012   Peripheral vascular disease ([facility])    Vitamin D deficiency    Past Surgical History:  Past Surgical History:  Procedure Laterality Date   ABDOMINAL HYSTERECTOMY     BUBBLE STUDY  06/03/2021   Procedure: BUBBLE STUDY;  Surgeon: Donato Heinz, MD;  Location: [facility];  Service: Cardiovascular;;   IR ANGIO EXTERNAL CAROTID SEL EXT CAROTID UNI R MOD SED  06/01/2021   IR ANGIO INTRA EXTRACRAN SEL COM CAROTID INNOMINATE UNI L MOD SED  06/01/2021   IR ANGIO INTRA EXTRACRAN SEL INTERNAL CAROTID UNI R MOD SED  06/01/2021   IR ANGIO VERTEBRAL SEL VERTEBRAL UNI R MOD SED  06/01/2021   IR US GUIDE VASC ACCESS RIGHT  06/01/2021   LAPAROSCOPIC BILATERAL SALPINGO OOPHERECTOMY     LOOP RECORDER INSERTION N/A 06/08/2021   Procedure: LOOP RECORDER INSERTION;  Surgeon: Constance Haw, MD;  Location: [facility];  Service: Cardiovascular;  Laterality: N/A;   pud surgery w/duod sticture-plasty and vagotomy  2003   Dr. Marlou Starks   TEE WITHOUT CARDIOVERSION N/A 06/03/2021   Procedure: TRANSESOPHAGEAL ECHOCARDIOGRAM (TEE);  Surgeon: Donato Heinz, MD;  Location: [facility] ENDOS[facility];  Service: Cardiovascular;  Laterality: N/A;   TONSILLECTOMY AND ADENOIDECTOMY      as a child   TUBAL LIGATION  1984   HPI:  Michaela Rios is a 70 y.o. female PMHx as reviewed below, recent stroke discharged 06/04/2021. After rehabilitation her husband said that there was incremental improvement. Last Thursday (08/05/2021) during therapy she was able to walk as well as at home however, the ensuing days she progressively developed generalized weakness to the point that she could not even sit up straight. Difficulty swallowing also reported. During prior admission swallow function was evaluated 6/30 with f/u on 7/08 revealing swallow WFL and tolerance of regular diet and thin liquids with no f/u needed for swallowing.   Assessment / Plan / Recommendation Clinical Impression  Pt demonstrates mild impariment in swallowing that seems primarily cognitive in nature. SHe is alert and following commands, but sluggish. She took a few oz of water from a straw without difficulty, but did have slow oral manipulation of a graham cracker, seemed to suck on it more than masticate and requested a liquid wash to clear. Recommend a dys 2 (finely chopped) diet at this time with potential to upgrade as mentation improves. SLP Visit Diagnosis: Dysphagia, oral phase (R13.11)    Aspiration Risk  Mild aspiration risk;Risk for inadequate nutrition/hydration    Diet Recommendation Dysphagia 2 (Fine chop);Thin liquid   Liquid Administration via: Cup;Straw Medication Administration: Whole meds with puree Supervision: Staff to assist with self feeding Compensations: Minimize  environmental distractions;Slow rate;Small sips/bites    Other  Recommendations Oral Care Recommendations: Oral care BID   Follow up Recommendations 24 hour supervision/assistance      Frequency and Duration min 2x/week  1 week       Prognosis        Swallow Study   General HPI: Michaela Rios is a 70 y.o. female PMHx as reviewed below, recent stroke discharged 06/04/2021. After rehabilitation her husband said that  there was incremental improvement. Last Thursday (08/05/2021) during therapy she was able to walk as well as at home however, the ensuing days she progressively developed generalized weakness to the point that she could not even sit up straight. Difficulty swallowing also reported. During prior admission swallow function was evaluated 6/30 with f/u on 7/08 revealing swallow WFL and tolerance of regular diet and thin liquids with no f/u needed for swallowing. Type of Study: Bedside Swallow Evaluation Diet Prior to this Study: NPO Temperature Spikes Noted: No Respiratory Status: Room air Behavior/Cognition: Alert;Cooperative;Requires cueing Oral Cavity Assessment: Dry Oral Care Completed by SLP: No Oral Cavity - Dentition: Adequate natural dentition Vision: Functional for self-feeding Self-Feeding Abilities: Needs assist Patient Positioning: Upright in bed Baseline Vocal Quality: Normal Volitional Cough: Strong Volitional Swallow: Able to elicit    Oral/Motor/Sensory Function Overall Oral Motor/Sensory Function: Generalized oral weakness   Ice Chips     Thin Liquid Thin Liquid: Within functional limits Presentation: Straw    Nectar Thick Nectar Thick Liquid: Not tested   Honey Thick Honey Thick Liquid: Not tested   Puree Puree: Not tested   Solid     Solid: Impaired Oral Phase Impairments: Impaired mastication Oral Phase Functional Implications: Prolonged oral transit      Thorin Starner, Katherene Ponto 08/11/2021,9:52 AM

## 2021-08-11 NOTE — Progress Notes (Addendum)
STROKE TEAM PROGRESS NOTE   INTERVAL HISTORY 70 year old female with recent strokes primarily involving cortex and subcortical white matter of all lobes in July, 2022, with extensive negative work-up for vasculitis and cryptogenic stroke on aspirin '81mg'$  daily at home, presented for progressive decline in function and generalized weakness. She follows Dr. Leonie Man in the office and has appt in November.  Chart review shows a recent MRI brain done on 9/4, which revealed known strokes as well as additional punctate areas of diffuse at left caudate head and posterior left putamen.   She is on aspirin at home. Plan to add plavix for 3 weeks, then continue only with plavix as patient had new strokes while on aspirin  No one is at the bedside at time of this exam.   Vitals:   08/11/21 1400 08/11/21 1426 08/11/21 1500 08/11/21 1514  BP: 108/73 108/73 115/77 115/77  Pulse:   81 81  Resp:    17  Temp:    98 F (36.7 C)  TempSrc:    Oral  SpO2:   99% 99%  Weight:      Height:       CBC:  Recent Labs  Lab 08/19/2021 1039 08/11/21 0420  WBC 6.4 6.1  NEUTROABS 4.9  --   HGB 12.1 10.5*  HCT 36.5 32.2*  MCV 85.3 85.0  PLT 402* Q000111Q   Basic Metabolic Panel:  Recent Labs  Lab 07/31/2021 1039 08/11/21 0420 08/11/21 0706  NA 130* 132*  --   K 4.0 2.7* 3.0*  CL 100 103  --   CO2 19* 20*  --   GLUCOSE 103* 101*  --   BUN 12 11  --   CREATININE 0.81 0.77  --   CALCIUM 8.5* 8.0*  --   MG  --   --  1.8    Lipid Panel: Results for DELOISE, BROSIUS (MRN [id number]) as of 08/11/2021 15:24  Ref. Range 05/27/2021 01:06  Total CHOL/HDL Ratio Latest Units: RATIO 4.0  Cholesterol Latest Ref Range: 0 - 200 mg/dL 96  HDL Cholesterol Latest Ref Range: >40 mg/dL 24 (L)  LDL (calc) Latest Ref Range: 0 - 99 mg/dL 45  Triglycerides Latest Ref Range: <150 mg/dL 136  VLDL Latest Ref Range: 0 - 40 mg/dL 27   HgbA1c:  Results for KJIRSTEN, JOFFE (MRN [id number]) as of 08/11/2021 15:24  Ref. 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  She is awake, alert, and oreinted. But she speaks slowly and softly, but speech is able to  be understood. No signs of aphasia or neglect. Visual Fields are full. Pupils are equal, round, and reactive to light. EOMI without ptosis or diploplia.  Facial sensation is symmetric to temperature Facial movement is symmetric.  Hearing is intact to voice. Uvula midline and palate elevates symmetrically. Shoulder shrug is symmetric. Tongue is midline without atrophy or fasciculations.   4-/5 strength right upper and lower, ~4-/5 left upper 3+/5 left lower. Sensation is symmetric to temperature in the arms and legs. Left toe is downgoing   ASSESSMENT/PLAN Michaela Rios is a 70 y.o. female with history of recent bihemispheric strokes, chronic migraine, hypertension,  presenting with  generalized weakness and spastic left sided hemiparesis. .   Stroke:  left caudate and putamen infarct embolic secondary to  likely small vessel disease .  Prior history of multiple cryptogenic strokes in July 2022 with negative extensive work-up she does have a PFO but at her age she has a low rope score of 2 making PFO unlikely cause of her strokes.. Will obtain doppler BLE, will obtain TCD with bubble study.  To get aggressive PFO continue with aspirin and plavix for 3 weeks followed by Plavix alone. PT/OT/ST. Needs loop recorder interrogation for paroxysmal A. fib. ED nurse notified of need. Note is pending.    MRI  08/01/2021 1. Continued expected evolution of deep gray matter infarcts bilaterally. 2. Additional punctate areas of diffusion hyperintensity are present within the left caudate head and posterior left putamen. These likely represent areas of acute/subacute ischemia. 3. Stable diffuse T2 signal change in the basal ganglia  bilaterally and left greater than right thalami. This likely reflects the sequela of chronic microvascular ischemia.   CTA HEAD AND NECK 05/26/2021  1. Minimal atherosclerosis in the head and neck without large vessel occlusion or significant stenosis. 2. Aortic Atherosclerosis (ICD10-I70.0).  TEE 06/03/2021: EF 60-65%, no left atrial appendage thrombus detected, Agitated saline contrast bubble study was positive with shunting  observed within 3-6 cardiac cycles suggestive of interatrial shunt.  Significant shunting seen, suggestive of large patent foramen ovale.     ILR placement 06/08/2021. Needs to be interrogated.   Doppler BLE 05/25/2021 - No evidence of deep vein thrombosis seen in the lower extremities,  bilaterally.  -No evidence of popliteal cyst, bilaterally.  Repeat during this hospitalization - ordered     LDL 45 HgbA1c 5.9 VTE prophylaxis - scd    Diet   DIET DYS 2 Room service appropriate? Yes; Fluid consistency: Thin   aspirin 81 mg daily prior to admission, now on aspirin 81 mg daily.   Therapy recommendations:  pending Disposition:  pending   Hypertension Home meds:  amlodipine, hydralazine, metoprolol Stable Permissive hypertension (OK if < 220/120) but gradually normalize in 5-7 days Long-term BP goal normotensive  LDL 45, goal < 70  HgbA1c 5.9, goal < 7.0  Other Stroke Risk Factors  Advanced Age >/= 48    Hx stroke/TIA  PFO    Other Active Problems    Hospital day # 1 I have personally obtained history,examined this patient, reviewed notes, independently viewed imaging studies, participated in medical decision making and plan of care.ROS completed by me personally and pertinent positives fully documented  I have made any additions or clarifications directly to the above note. Agree with note above.  Patient with recent extensive work-up for bilateral multiple strokes in July 22 negative for vasculitis and cryptogenic stroke who has a loop recorder.  She  presented with fever and altered mental status and MRI shows small bilateral subcortical infarcts Likely from small vessel disease.  Recommend aspirin Plavix for 3 weeks followed by Plavix alone.  Interrogate loop recorder for paroxysmal A. fib.  Check lower extremity venous Dopplers for DVT and TCD bubble study to gauge size of PFO.  I do not think patient is a candidate for endovascular PFO closure at her advanced age of 23 with a low rope score of 2.  Fever work-up as per primary team.  Long discussion with patient and Dr. Loleta Books and answered questions.  Greater than 50% time during this 35-minute visit was spent in counseling and coordination of care and discussion with care team and answering questions.  Antony Contras, MD Medical Director [facility] Pager: [phone] 08/11/2021 4:35 PM  To contact Stroke Continuity provider, please refer to [email or web address] After hours, contact General Neurology

## 2021-08-11 NOTE — Progress Notes (Signed)
? ?  Inpatient Rehab Admissions Coordinator : ? ?Per therapy recommendations, patient was screened for CIR candidacy by Lamiah Marmol RN MSN.  At this time patient appears to be a potential candidate for CIR. I will place a rehab consult per protocol for full assessment. Please call me with any questions. ? ?Marchella Hibbard RN MSN ?Admissions Coordinator ?[phone] ?  ?

## 2021-08-11 NOTE — Progress Notes (Signed)
Physical Therapy Evaluation Patient Details Name: Michaela Rios MRN: [id number] DOB: [birth year] Today's Date: 08/11/2021  History of Present Illness  Pt is a 70yo female presenting to [facility] ED on 9/13 with weakness, poor PO inntake, found to be febrile in ED. Workup for sepsis, unclear etiology. Pt with admission July of 2022 for CVA. Per neuro notes, acute/subacute left posterior putamen and caudate head on MRI on 9/4. 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   Clinical Impression  Pt presents with the impairments above and problems listed below. Pt requiring increased time to answer questions with slightly slowed speech. Followed one step commands consistently with increased time. Required mod-max assist for bed mobility, max assist for transfers and sidestepping EOB for repositioning. Pt with head rotated  to the left and unable to actively rotate R without increase in pain. Gross motor coordination decreased especially notable in BUEs, and LLE is weaker than RLE. Recommending CIR-level care to address current impairments; husband is available to provide assistance upon discharge. We will continue to follow her acutely to promote independence with functional mobility.     Recommendations for follow up therapy are one component of a multi-disciplinary discharge planning process, led by the attending physician.  Recommendations may be updated based on patient status, additional functional criteria and insurance authorization.  Follow Up Recommendations CIR;Supervision/Assistance - 24 hour    Equipment Recommendations  3in1 (PT);Wheelchair cushion (measurements PT);Wheelchair (measurements PT)    Recommendations for Other Services       Precautions / Restrictions Precautions Precautions: Fall Restrictions Weight Bearing Restrictions: No      Mobility  Bed Mobility Overal bed mobility: Needs Assistance Bed Mobility: Sit to Supine;Supine to Sit     Supine to  sit: Mod assist Sit to supine: Max assist   General bed mobility comments: Pt required mod assist for trunk control and LE advancement off bed to sit. To return to supine, pt required max assist for lowering trunk and bringing LEs back to bed.    Transfers Overall transfer level: Needs assistance Equipment used: 1 person hand held assist Transfers: Sit to/from Stand Sit to Stand: Max assist         General transfer comment: Pt required max assist to perform sit to stand and remain standing for cleanup and pericare. Pt with heavy posterior lean. PT stood in front of pt and had pt hold to PT arms.  Ambulation/Gait Ambulation/Gait assistance: Max assist   Assistive device: 1 person hand held assist   Gait velocity: decreased   General Gait Details: Pt required max assist to perform sidesteps at EOB; able to weightshift to left and move right foot.  Required PT assist to weightshift to the R slide L foot. Max assist provided for steadying and anti-gravity support.  Stairs            Wheelchair Mobility    Modified Rankin (Stroke Patients Only) Modified Rankin (Stroke Patients Only) Pre-Morbid Rankin Score: Moderately severe disability Modified Rankin: Severe disability     Balance Overall balance assessment: Needs assistance Sitting-balance support: No upper extremity supported;Feet unsupported;Single extremity supported Sitting balance-Leahy Scale: Poor Sitting balance - Comments: Pt required approximately min assist to maintain upright seated posture initially, but after ~60s able to maintain upright seated position with close supervision   Standing balance support: Bilateral upper extremity supported;During functional activity Standing balance-Leahy Scale: Zero Standing balance comment: Pt reliant on BUE external support from PT (max assist) to remain standing in static and  dynamic tasks. Demonstrated posterior lean.                              Pertinent Vitals/Pain Pain Assessment: 0-10 Pain Score: 7  Pain Location: headache Pain Descriptors / Indicators: Headache Pain Intervention(s): Limited activity within patient's tolerance;Monitored during session    [place] expects to be discharged to:: Private residence Living Arrangements: Spouse/significant other Available Help at Discharge: Family;Available 24 hours/day Type of Home: House Home Access: Stairs to enter Entrance Stairs-Rails: Right (no railing in the front, right railing in the back) Entrance Stairs-Number of Steps: 3 from the front, 7 from the back Home Layout: One level Home Equipment: Shower seat;Grab bars - tub/shower;Walker - 4 wheels Additional Comments: Husband retired and available for 24/7 assist    Prior Function Level of Independence: Needs assistance   Gait / Transfers Assistance Needed: Uses cane and rollator for ambulation  ADL's / Homemaking Assistance Needed: Husband does cooking, assists with bathing. IND with dressing.        Hand Dominance        Extremity/Trunk Assessment   Upper Extremity Assessment Upper Extremity Assessment: Generalized weakness;RUE deficits/detail;LUE deficits/detail RUE Deficits / Details: RUE demonstrated decreased ROM and strength compared to LUE RUE Sensation: WNL RUE Coordination: decreased gross motor LUE Deficits / Details: ROM WFL, strength 3+/5 grossly. LUE Sensation: WNL LUE Coordination: decreased gross motor    Lower Extremity Assessment Lower Extremity Assessment: RLE deficits/detail;LLE deficits/detail;Generalized weakness RLE Coordination: decreased gross motor LLE Coordination: decreased gross motor    Cervical / Trunk Assessment Cervical / Trunk Assessment: Kyphotic;Other exceptions Cervical / Trunk Exceptions: Pt with head rotated toward the left with tight SCM bilaterally; pt reports painful and able to actively rotate head to near midline but with increased  pain. Passively, head can be rotated further to midline with increased muscular guarding and pain; further testing deferred.  Communication   Communication: Other (comment) (Pt required increased time to respond, sometimes with multiple prompts.)  Cognition Arousal/Alertness: Awake/alert Behavior During Therapy: Flat affect Overall Cognitive Status: No family/caregiver present to determine baseline cognitive functioning                                 General Comments: Pt required increased time to respond to questions and often required multiple prompts. Pt had soiled bed and reports she knew she had but had not told anyone.      General Comments      Exercises     Assessment/Plan    PT Assessment Patient needs continued PT services  PT Problem List Decreased strength;Decreased range of motion;Decreased activity tolerance;Decreased balance;Decreased mobility;Decreased coordination;Decreased cognition;Decreased knowledge of use of DME;Decreased safety awareness;Decreased knowledge of precautions       PT Treatment Interventions DME instruction;Gait training;Functional mobility training;Therapeutic activities;Therapeutic exercise;Balance training;Neuromuscular re-education;Cognitive remediation;Patient/family education;Stair training    PT Goals (Current goals can be found in the Care Plan section)  Acute Rehab PT Goals Patient Stated Goal: to reduce pain PT Goal Formulation: With patient Time For Goal Achievement: 08/25/21 Potential to Achieve Goals: Good    Frequency Min 4X/week   Barriers to discharge        Co-evaluation               AM-PAC PT "6 Clicks" Mobility  Outcome Measure Help needed turning from your back to your side while in a flat  bed without using bedrails?: A Lot Help needed moving from lying on your back to sitting on the side of a flat bed without using bedrails?: A Lot Help needed moving to and from a bed to a chair (including a  wheelchair)?: Total Help needed standing up from a chair using your arms (e.g., wheelchair or bedside chair)?: Total Help needed to walk in hospital room?: Total Help needed climbing 3-5 steps with a railing? : Total 6 Click Score: 8    End of Session Equipment Utilized During Treatment: Gait belt Activity Tolerance: Patient limited by fatigue Patient left: in bed;with call bell/phone within reach (On stretcher in ED) Nurse Communication: Mobility status PT Visit Diagnosis: Other symptoms and signs involving the nervous system (R29.898);Muscle weakness (generalized) (M62.81);Unsteadiness on feet (R26.81)    Time: [id number] PT Time Calculation (min) (ACUTE ONLY): 26 min   Charges:   PT Evaluation $PT Eval Moderate Complexity: 1 Mod PT Treatments $Therapeutic Activity: 8-22 mins        Dawayne Cirri, SPT Dawayne Cirri 08/11/2021, 2:32 PM

## 2021-08-12 ENCOUNTER — Other Ambulatory Visit (HOSPITAL_COMMUNITY): Payer: Medicare HMO

## 2021-08-12 ENCOUNTER — Ambulatory Visit: Payer: Medicare HMO | Admitting: Occupational Therapy

## 2021-08-12 ENCOUNTER — Encounter (HOSPITAL_COMMUNITY): Payer: Medicare HMO

## 2021-08-12 DIAGNOSIS — I6310 Cerebral infarction due to embolism of unspecified precerebral artery: Secondary | ICD-10-CM | POA: Diagnosis not present

## 2021-08-12 DIAGNOSIS — G43909 Migraine, unspecified, not intractable, without status migrainosus: Secondary | ICD-10-CM | POA: Diagnosis not present

## 2021-08-12 LAB — CBC
HCT: 36.9 % (ref 36.0–46.0)
Hemoglobin: 11.7 g/dL — ABNORMAL LOW (ref 12.0–15.0)
MCH: 27.1 pg (ref 26.0–34.0)
MCHC: 31.7 g/dL (ref 30.0–36.0)
MCV: 85.6 fL (ref 80.0–100.0)
Platelets: 363 10*3/uL (ref 150–400)
RBC: 4.31 MIL/uL (ref 3.87–5.11)
RDW: 15.7 % — ABNORMAL HIGH (ref 11.5–15.5)
WBC: 8.2 10*3/uL (ref 4.0–10.5)
nRBC: 0 % (ref 0.0–0.2)

## 2021-08-12 LAB — URINE CULTURE: Culture: 30000 — AB

## 2021-08-12 LAB — BASIC METABOLIC PANEL
Anion gap: 9 (ref 5–15)
BUN: 11 mg/dL (ref 8–23)
CO2: 20 mmol/L — ABNORMAL LOW (ref 22–32)
Calcium: 8.6 mg/dL — ABNORMAL LOW (ref 8.9–10.3)
Chloride: 106 mmol/L (ref 98–111)
Creatinine, Ser: 0.63 mg/dL (ref 0.44–1.00)
GFR, Estimated: >60 mL/min (ref >60)
Glucose, Bld: 97 mg/dL (ref 70–99)
Potassium: 3.4 mmol/L — ABNORMAL LOW (ref 3.5–5.1)
Sodium: 135 mmol/L (ref 135–145)

## 2021-08-12 MED ORDER — POTASSIUM CHLORIDE CRYS ER 20 MEQ PO TBCR
40.0000 meq | EXTENDED_RELEASE_TABLET | Freq: Once | ORAL | Status: AC
  Administered 2021-08-12: 40 meq via ORAL
  Filled 2021-08-12: qty 2

## 2021-08-12 MED ORDER — SODIUM CHLORIDE 0.9 % IV SOLN: INTRAVENOUS | Status: AC

## 2021-08-12 MED ORDER — ATORVASTATIN CALCIUM 40 MG PO TABS
40.0000 mg | ORAL_TABLET | Freq: Every day | ORAL | Status: DC
  Administered 2021-08-12 – 2021-08-18 (×7): 40 mg via ORAL
  Filled 2021-08-12 (×7): qty 1

## 2021-08-12 MED ORDER — CLONAZEPAM 0.5 MG PO TABS
0.5000 mg | ORAL_TABLET | Freq: Every day | ORAL | Status: DC
  Administered 2021-08-12: 0.5 mg via ORAL
  Filled 2021-08-12: qty 1

## 2021-08-12 MED ORDER — LEVOFLOXACIN IN D5W 750 MG/150ML IV SOLN: 750.0000 mg | INTRAVENOUS | Status: DC

## 2021-08-12 MED ORDER — CEFTRIAXONE SODIUM 1 G IJ SOLR
1.0000 g | INTRAMUSCULAR | Status: DC
  Administered 2021-08-12 – 2021-08-13 (×2): 1 g via INTRAVENOUS
  Filled 2021-08-12 (×2): qty 10

## 2021-08-12 NOTE — Progress Notes (Signed)
Pharmacy Antibiotic Note  Michaela Rios is a 70 y.o. female admitted on 08/16/2021 with UTI.  Pharmacy has been consulted for antibiotic recommendation for Klebsiella UTI.  Patient has documented allergy to penicillins and Keflex with hives listed as the reaction. RN spoke to patient to get more specifics about reactions and patient denies having hives and reports only mild rash. RN to update profile. Additionally, Rocephin is structurally dissimilar to penicillins and Keflex.  Plan: Recommend Rocephin 1 gram IV every 24 hours x 3 days (total of 5 days per MD) Monitor for resolution of symptoms Monitor clinical picture, blood cultures   Height: '5\' 6"'$  (167.6 cm) Weight: 54.5 kg (120 lb 2.4 oz) IBW/kg (Calculated) : 59.3  Temp (24hrs), Avg:98.3 F (36.8 C), Min:98 F (36.7 C), Max:99.1 F (37.3 C)  Recent Labs  Lab 08/16/2021 1039 08/11/21 0420 08/12/21 0055  WBC 6.4 6.1 8.2  CREATININE 0.81 0.77 0.63  LATICACIDVEN 1.7  --   --     Estimated Creatinine Clearance: 56.3 mL/min (by C-G formula based on SCr of 0.63 mg/dL).    Allergies  Allergen Reactions   Amitriptyline Hcl     Changed personality   Shellfish Allergy Nausea And Vomiting and Other (See Comments)    Tested "high" on allergy test   Amoxicillin Hives   Cephalexin Hives   Lisinopril Cough    .   Penicillins Hives    Antimicrobials this admission: 9/13 aztreonam >> 9/14 9/13 vancomycin >> 9/14 9/13 Flagyl x 1 9/15 Rocephin >> (9/17)   Microbiology results: 9/13 BCx: ng x 2 days 9/13 UCx: Klebsiella pneumoniae (R only to ampicillin)    Thank you for allowing [place] to participate in this patients care. Jens Som, PharmD 08/12/2021 9:48 AM  **Pharmacist phone directory can be found on [email or web address] listed under [place]**

## 2021-08-12 NOTE — Progress Notes (Signed)
STROKE TEAM PROGRESS NOTE   INTERVAL HISTORY Patient is lying in bed comfortably.  She states she is feeling better.  She has no complaints.  Neurological exam is unchanged.  Vital signs are stable.  Loop recorder has not yet been interrogated.  No one is at the bedside at time of this exam.   Vitals:   08/12/21 1215 08/12/21 1223 08/12/21 1353 08/12/21 1603  BP: (!) 143/95 (!) 149/95 (!) 132/91 139/84  Pulse: (!) 108 (!) 108 (!) 110 (!) 110  Resp: (!) 25 (!) 21 (!) 23 19  Temp: 97.7 F (36.5 C) 98.7 F (37.1 C) 98.5 F (36.9 C) 98.6 F (37 C)  TempSrc: Oral Oral Oral Oral  SpO2: 97% 97% 97% 96%  Weight:      Height:       CBC:  Recent Labs  Lab 08/04/2021 1039 08/11/21 0420 08/12/21 0055  WBC 6.4 6.1 8.2  NEUTROABS 4.9  --   --   HGB 12.1 10.5* 11.7*  HCT 36.5 32.2* 36.9  MCV 85.3 85.0 85.6  PLT 402* 313 [id number]   Basic Metabolic Panel:  Recent Labs  Lab 08/11/21 0420 08/11/21 0706 08/12/21 0055  NA 132*  --  135  K 2.7* 3.0* 3.4*  CL 103  --  106  CO2 20*  --  20*  GLUCOSE 101*  --  97  BUN 11  --  11  CREATININE 0.77  --  0.63  CALCIUM 8.0*  --  8.6*  MG  --  1.8  --     Lipid Panel: Results for CYNIAH, BILINSKI (MRN [id number]) as of 08/11/2021 15:24  Ref. Range 05/27/2021 01:06  Total CHOL/HDL Ratio Latest Units: RATIO 4.0  Cholesterol Latest Ref Range: 0 - 200 mg/dL 96  HDL Cholesterol Latest Ref Range: >40 mg/dL 24 (L)  LDL (calc) Latest Ref Range: 0 - 99 mg/dL 45  Triglycerides Latest Ref Range: <150 mg/dL 136  VLDL Latest Ref Range: 0 - 40 mg/dL 27   HgbA1c:  Results for AVNI, VOTA (MRN [id number]) as of 08/11/2021 15:24  Ref. 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  She is awake, alert, and oreinted. But she speaks slowly and softly, but speech is able to  be understood. No signs of aphasia or  neglect. Visual Fields are full. Pupils are equal, round, and reactive to light. EOMI without ptosis or diploplia.  Facial sensation is symmetric to temperature Facial movement is symmetric.  Hearing is intact to voice. Uvula midline and palate elevates symmetrically. Shoulder shrug is symmetric. Tongue is midline without atrophy or fasciculations.   4-/5 strength right upper and lower, ~4-/5 left upper 3+/5 left lower. Sensation is symmetric to temperature in the arms and legs. Left toe is downgoing   ASSESSMENT/PLAN Ms. CLAUDETTA FRAGER is a 70 y.o. female with history of recent bihemispheric strokes, chronic migraine, hypertension,  presenting with  generalized weakness and spastic left sided hemiparesis. .   Stroke:  left caudate and putamen infarct embolic secondary to likely small vessel disease .  Prior history of multiple cryptogenic strokes in July 2022 with negative extensive work-up she does have a PFO but at her age she has a low rope score of 2 making PFO unlikely cause of her strokes.. Will obtain doppler BLE, will obtain TCD  with bubble study.  To get aggressive PFO continue with aspirin and plavix for 3 weeks followed by Plavix alone. PT/OT/ST. Needs loop recorder interrogation for paroxysmal A. fib. ED nurse notified of need. Note is pending.    MRI  08/01/2021 1. Continued expected evolution of deep gray matter infarcts bilaterally. 2. Additional punctate areas of diffusion hyperintensity are present within the left caudate head and posterior left putamen. These likely represent areas of acute/subacute ischemia. 3. Stable diffuse T2 signal change in the basal ganglia bilaterally and left greater than right thalami. This likely reflects the sequela of chronic microvascular ischemia.   CTA HEAD AND NECK 05/26/2021  1. Minimal atherosclerosis in the head and neck without large vessel occlusion or significant stenosis. 2. Aortic Atherosclerosis (ICD10-I70.0).  TEE 06/03/2021: EF  60-65%, no left atrial appendage thrombus detected, Agitated saline contrast bubble study was positive with shunting  observed within 3-6 cardiac cycles suggestive of interatrial shunt.  Significant shunting seen, suggestive of large patent foramen ovale.     ILR placement 06/08/2021. Needs to be interrogated.   Doppler BLE 05/25/2021 - No evidence of deep vein thrombosis seen in the lower extremities,  bilaterally.  -No evidence of popliteal cyst, bilaterally.  Repeat during this hospitalization - ordered     LDL 45 HgbA1c 5.9 VTE prophylaxis - scd    Diet   Diet regular Room service appropriate? Yes with Assist; Fluid consistency: Thin   aspirin 81 mg daily prior to admission, now on aspirin 81 mg daily.   Therapy recommendations:  CLR Disposition:  pending   Hypertension Home meds:  amlodipine, hydralazine, metoprolol Stable Permissive hypertension (OK if < 220/120) but gradually normalize in 5-7 days Long-term BP goal normotensive  LDL 45, goal < 70  HgbA1c 5.9, goal < 7.0  Other Stroke Risk Factors  Advanced Age >/= 45    Hx stroke/TIA  PFO    Other Active Problems    Hospital day # 2   Patient with recent extensive work-up for bilateral multiple strokes in July 22 negative for vasculitis and cryptogenic stroke who has a loop recorder.  She presented with fever and altered mental status and MRI shows small bilateral subcortical infarcts Likely from small vessel disease.  Recommend aspirin Plavix for 3 weeks followed by Plavix alone.  Interrogate loop recorder for paroxysmal A. fib.  Check lower extremity venous Dopplers for DVT and TCD bubble study to gauge size of PFO.  I do not think patient is a candidate for endovascular PFO closure at her advanced age of 36 with a low rope score of 2.  Fever work-up as per primary team.  Long discussion with patient and Dr. Loleta Books and answered questions.  Greater than 50% time during this 25-minute visit was spent in counseling  and coordination of care and discussion with care team and answering questions.I called and left message for pt`s husband to call me but he did not return my call.  Antony Contras, MD Medical Director [place] Pager: [phone] 08/12/2021 4:39 PM     To contact Stroke Continuity provider, please refer to [email or web address] After hours, contact General Neurology

## 2021-08-12 NOTE — Progress Notes (Signed)
[employer] rep at bedside to interrogate loop recorder. Per Rep they do not document in Epic, but physical printout of loop recorder results on patient's physical chart. Dr. Loleta Books made aware of loop recorder interrogation.

## 2021-08-12 NOTE — Progress Notes (Signed)
Pharmacist Evon Slack asked this RN via secure chart to enquire with patient her response to Keflex and Amoxicillin.  When asked about reaction to amoxicillin and kelfex, patient reported she had no hives with those antibiotics. Pharmacist Evon Slack notified via secure chat. Allergies updated in Epic.

## 2021-08-12 NOTE — NC FL2 (Signed)
[place] LEVEL OF CARE SCREENING TOOL     IDENTIFICATION  Patient Name: Michaela Rios Birthdate: 1951-07-01 Sex: female Admission Date (Current Location): 08/17/2021  [facility] and [place] Number:  Herbalist and Address:  [facility], [place] [address] [place], [place] [address]      Provider Number: [id number]  Attending Physician Name and Address:  Edwin Dada, *  Relative Name and Phone Number:       Current Level of Care: Hospital Recommended Level of Care: [place] Prior Approval Number:    Date Approved/Denied:   PASRR Number: [id number]  Discharge Plan: SNF    Current Diagnoses: Patient Active Problem List   Diagnosis Date Noted   Weakness 08/11/2021   Cerebral embolism with cerebral infarction 08/11/2021   Sepsis secondary to UTI ([place]) [id number]   Embolic stroke of right basal ganglia ([place]) 06/08/2021   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[place]) 06/08/2021   Cerebral thrombosis with cerebral infarction 05/27/2021   Numbness and tingling in right hand 09/08/2020   Rhabdomyolysis 08/18/2020   Hypokalemia 08/17/2020   Post herpetic neuralgia 06/30/2020   Left lumbar radiculopathy 06/04/2018   Memory difficulties 03/05/2018   Sialadenitis, right parotid 06/24/2017   Insomnia 05/12/2017   Prediabetes 11/08/2016   Vitamin D deficiency 06/08/2009   Depression 01/07/2009   PEPTIC ULCER DISEASE, CHRONIC 01/07/2009   OTHER OBSTRUCTION OF DUODENUM 01/07/2009   HIATAL HERNIA 01/07/2009   Irritable bowel syndrome 01/07/2009   DEGENERATIVE JOINT DISEASE 01/07/2009   ANEMIA, HX OF 01/07/2009   Right middle lobe pulmonary nodule 06/09/2008   Anxiety 04/01/2008   Essential hypertension 04/01/2008   Allergic rhinitis 04/01/2008   GERD 04/01/2008   LOW BACK PAIN SYNDROME 04/01/2008   Migraines 04/01/2008    Orientation RESPIRATION BLADDER Height & Weight     Self, Time, Situation, Place  Normal Continent  Weight: 120 lb 2.4 oz (54.5 kg) Height:  '5\' 6"'$  (167.6 cm)  BEHAVIORAL SYMPTOMS/MOOD NEUROLOGICAL BOWEL NUTRITION STATUS      Continent Diet (see dc summary)  AMBULATORY STATUS COMMUNICATION OF NEEDS Skin   Extensive Assist Verbally Normal                       Personal Care Assistance Level of Assistance  Bathing, Feeding, Dressing Bathing Assistance: Maximum assistance Feeding assistance: Limited assistance Dressing Assistance: Limited assistance     Functional Limitations Info             SPECIAL CARE FACTORS FREQUENCY  PT (By licensed PT), OT (By licensed OT)     PT Frequency: 5x/week OT Frequency: 5x/week            Contractures Contractures Info: Not present    Additional Factors Info  Code Status, Allergies, Psychotropic Code Status Info: Full Allergies Info: Amitriptyline Hcl, Shellfish Allergy, Lisinopril, Amoxicillin, Cephalexin, Penicillins Psychotropic Info: Wellbutrin, Effexor         Current Medications (08/12/2021):  This is the current hospital active medication list Current Facility-Administered Medications  Medication Dose Route Frequency Provider Last Rate Last Admin   acetaminophen (TYLENOL) tablet 650 mg  650 mg Oral Q6H PRN Fuller Plan A, MD   650 mg at 08/11/21 0844   Or   acetaminophen (TYLENOL) suppository 650 mg  650 mg Rectal Q6H PRN Fuller Plan A, MD       albuterol (PROVENTIL) (2.5 MG/3ML) 0.083% nebulizer solution 2.5 mg  2.5 mg Nebulization Q6H PRN Fuller Plan  A, MD       amLODipine (NORVASC) tablet 5 mg  5 mg Oral Daily Tamala Julian, Rondell A, MD   5 mg at 08/12/21 0953   aspirin EC tablet 81 mg  81 mg Oral Daily Smith, Rondell A, MD   81 mg at 08/12/21 0959   atorvastatin (LIPITOR) tablet 40 mg  40 mg Oral QHS Danford, Suann Larry, MD       buPROPion (WELLBUTRIN XL) 24 hr tablet 300 mg  300 mg Oral Daily Smith, Rondell A, MD   300 mg at 08/12/21 0954   butalbital-acetaminophen-caffeine (FIORICET) 50-325-40 MG per tablet 1  tablet  1 tablet Oral Q6H PRN Fuller Plan A, MD       cefTRIAXone (ROCEPHIN) 1 g in sodium chloride 0.9 % 100 mL IVPB  1 g Intravenous Q24H Danford, Suann Larry, MD 200 mL/hr at 08/12/21 1009 1 g at 08/12/21 1009   clopidogrel (PLAVIX) tablet 75 mg  75 mg Oral Daily Dennison Mascot, PA-C   75 mg at 08/12/21 0953   enoxaparin (LOVENOX) injection 40 mg  40 mg Subcutaneous Q24H Smith, Rondell A, MD   40 mg at 08/11/21 1649   hydrALAZINE (APRESOLINE) injection 10 mg  10 mg Intravenous Q4H PRN Fuller Plan A, MD       hydrALAZINE (APRESOLINE) tablet 25 mg  25 mg Oral BID Tamala Julian, Rondell A, MD   25 mg at 08/12/21 0953   LORazepam (ATIVAN) tablet 1 mg  1 mg Oral BID PRN Fuller Plan A, MD   1 mg at 08/11/21 0845   metoprolol succinate (TOPROL-XL) 24 hr tablet 100 mg  100 mg Oral Daily Tamala Julian, Rondell A, MD   100 mg at 08/12/21 0953   ondansetron (ZOFRAN) tablet 4 mg  4 mg Oral Q6H PRN Norval Morton, MD       Or   ondansetron (ZOFRAN) injection 4 mg  4 mg Intravenous Q6H PRN Smith, Rondell A, MD       pantoprazole (PROTONIX) EC tablet 40 mg  40 mg Oral Daily PRN Smith, Rondell A, MD       sodium chloride flush (NS) 0.9 % injection 3 mL  3 mL Intravenous Q12H Smith, Rondell A, MD   3 mL at 08/12/21 1000   topiramate (TOPAMAX) tablet 100 mg  100 mg Oral BID Fuller Plan A, MD   100 mg at 08/12/21 0952   venlafaxine XR (EFFEXOR-XR) 24 hr capsule 150 mg  150 mg Oral Q breakfast Fuller Plan A, MD   150 mg at 08/12/21 0858   And   venlafaxine XR (EFFEXOR-XR) 24 hr capsule 75 mg  75 mg Oral QHS Smith, Rondell A, MD   75 mg at 08/11/21 2349   vitamin B-12 (CYANOCOBALAMIN) tablet 1,000 mcg  1,000 mcg Oral Daily Fuller Plan A, MD   1,000 mcg at 08/12/21 0953   zolpidem (AMBIEN) tablet 10 mg  10 mg Oral QHS PRN Norval Morton, MD   10 mg at 08/12/21 0006     Discharge Medications: Please see discharge summary for a list of discharge medications.  Relevant Imaging Results:  Relevant Lab  Results:   Additional Information SSN: [id number]. [place] COVID-19 Vaccine 08/28/20, 02/05/2020 , 01/11/2020  Benard Halsted, LCSW

## 2021-08-12 NOTE — Consult Note (Signed)
   [facility] Inpatient Consult   08/12/2021  Veola Hegland Hickmon [birth year] [id number]  [place] [facility] [ACO] Patient: Holland Falling Medicare  Primary Care Provider:  Binnie Rail, MD, [facility] an Embedded provider  Patient screened for  hospitalization with noted high risk score for unplanned readmission risk and  to assess for potential [facility] Management service needs for post hospital transition. Patient has been active with an [place] Management program with [facility].   Review of patient's medical record reveals patient is being recommended for a CIR transition.  Patient would need insurance authorization noted as well.  Plan:  Continue to follow progress and disposition to assess for post hospital care management needs.    For questions contact:   Natividad Brood, RN BSN [facility] Liaison  [phone] business mobile phone Toll free office [phone]  Fax number: [phone] [email or web address]

## 2021-08-12 NOTE — Progress Notes (Signed)
[facility] PROGRESS NOTE    Michaela Rios  NID:[id number] DOB: 1951/06/06 DOA: 08/11/2021 PCP: Binnie Rail, MD      Brief Narrative:  Michaela Rios is a 70 y.o. F with HTN, CKD IIIa, and PVD as well as recent admission for stroke and FUO presented with again fever and weakness.  Patient presented 2 months ago with weakness, fall, urinary irritative symptoms and flank pain, found to have HR 140 and Temp 101.72F, admitted for sepsis.  In the hospital, after 2 days had confusion out of proportion to resolving sepsis, MRI brain showed a right BG stroke but also hyperintensity of the head of the caudate, left thalamus and vasogenic edema with a broader differential including infection, neoplasm, or autoimmune disease.  Neurology and ID were consulted.    LP with 2 WBC in tube 4.  HSV PCR on CSF negative.  Crypto antigen on CSF negative.  Protein high and RBCs still 22 in tube 4.  Further work up for systemic or CNS vasculitis, including cerebral angiography was negative. Antibiotics were held and fever abated.   Discharged to CIR.  Since returning to home, patient had about a week of feeling well, ambulating, no fever, then began to feel sick again, weak, tired, malaise.  Returned to the ER, where she was febrile, tachycardic, but WBC normal, lactate normal, CXR normal.  UA with bacteria, started on broad spectrum antibiotics and admitted.          Assessment & Plan:  Fever due to Possible pyelonephritis Paitent presented again with fever as chief complaint, weakness somewhat secondary.  Sepsis ruled out, met SIRS criteria but due to pyelo, not clinically septic.  Urine growing Klebsiella, no clear urinary irritative symptoms, but CXR clear, blood cultures clear and low suspicion for meningitis given time course.  She appears to be improving with fluids and antibiotics.   - Narrow to Rocephin, discussed with Pharmacy.   - Follow urine and blood cultures and  senstivities   Torticollis Again, low clinical suspicion for meningitis, this is more a left sided neck spasm, present for the last >1 week per husband. MRI brain from 9/4 showed no abnormalities in the upper cervical spine. 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   - Trial low dose clonazepam     Stroke Likely subacute.  New infarcts were apparent on MRI brain obtained by PCP 1 week prior to admission.   - Consult Neurology - Continue aspirin, Plavix - Start atorvastatin - Check Lipids - PT eval - Follow TCD study and LE dopplers - Loop interrogation shows no evidence of Afib    Hypertension BP normal - Continue amlodipine, hydralazine, metoprolol  CKD IIIa  Chronic migraines - Continue venlafaxine, topiramate, bupropion   Hyponatremia Mild asymptomatic  Hypokalemia - Supplement K  Anemia, normocytic      Disposition: Status is: Inpatient  Remains inpatient appropriate because:IV treatments appropriate due to intensity of illness or inability to take PO  Dispo: The patient is from: Home              Anticipated d/c is to: SNF              Patient currently is not medically stable to d/c.   Difficult to place patient No       Level of care: Med-Surg  MDM: The below labs and imaging reports were reviewed and summarized above.  Medication management as above.    DVT prophylaxis: enoxaparin (LOVENOX) injection 40 mg Start: 08/06/2021 1600  Code Status: FULL Family Communication: Husband    Consultants:     Procedures:     Antimicrobials:      Culture data:              Subjective: Better again today.  No fever.  Has left neck stiffness and discomfort, no mass.  No cough, no headache, no confusion.  No cough, repsiratory symptoms.         Objective: Vitals:   08/12/21 0950 08/12/21 1213 08/12/21  1215 08/12/21 1223  BP: (!) 150/105 (!) 142/94 (!) 143/95 (!) 149/95  Pulse: (!) 108 (!) 108 (!) 108 (!) 108  Resp: 16 (!) 21 (!) 25 (!) 21  Temp:  98.2 F (36.8 C) 97.7 F (36.5 C) 98.7 F (37.1 C)  TempSrc:  Oral Oral Oral  SpO2: 94% 98% 97% 97%  Weight:      Height:        Intake/Output Summary (Last 24 hours) at 08/12/2021 1351 Last data filed at 08/12/2021 1000 Gross per 24 hour  Intake 1542.59 ml  Output 250 ml  Net 1292.59 ml   Filed Weights   08/25/2021 1020 08/11/21 2312  Weight: 55.3 kg 54.5 kg    Examination: General appearance: Adult female, appears weak but is oriented and interactive HEENT:   Skin: Warm and dry.  no jaundice.  No suspicious rashes or lesions. Cardiac: Tachycardic, regular, no LE edema, Radial pulses 2+ and symmetric   Respiratory: Normal respiratory rate and rhythm.  CTAB without rales or wheezes. Abdomen: Abdomen soft.  No TTP. No ascites, distension, hepatosplenomegaly.   MSK: No deformities or effusions. Neuro: Extraocular movements are intact, without nystagmus.  Cranial nerve 7 is symmetrical.  Cranial nerve 8 is within normal limits.  There is dystonia in the left neck.  Cranial nerve 12 is midline.  Motor strength testing is 5-/5 in the right arm and leg, on the left she can resist but not lift against gravity.  Finger-to-nose testing is within normal limits on the right, limited by left arm weakness. The patient is oriented to time, place and person.  Speech is fluent. Recall, recent and remote, as well as general fund of knowledge seem within normal limits.  Attention span and concentration are within normal limits.   Psych: Sensorium intact and responding to questions, psychomotor slowing noted, attention diminished, affect blunted     Data Reviewed: I have personally reviewed following labs and imaging studies:  CBC: Recent Labs  Lab 08/14/2021 1039 08/11/21 0420 08/12/21 0055  WBC 6.4 6.1 8.2  NEUTROABS 4.9  --   --   HGB 12.1 10.5*  11.7*  HCT 36.5 32.2* 36.9  MCV 85.3 85.0 85.6  PLT 402* 313 672   [facility] Metabolic Panel: Recent Labs  Lab 08/20/2021 1039 08/11/21 0420 08/11/21 0706 08/12/21 0055  NA 130* 132*  --  135  K 4.0 2.7* 3.0* 3.4*  CL 100 103  --  106  CO2 19* 20*  --  20*  GLUCOSE 103* 101*  --  97  BUN 12 11  --  11  CREATININE 0.81 0.77  --  0.63  CALCIUM 8.5* 8.0*  --  8.6*  MG  --   --  1.8  --    GFR: Estimated Creatinine Clearance: 56.3 mL/min (by C-G formula  based on SCr of 0.63 mg/dL). Liver Function Tests: Recent Labs  Lab 07/29/2021 1039  AST 26  ALT 12  ALKPHOS 66  BILITOT 0.8  PROT 7.0  ALBUMIN 2.9*   No results for input(s): LIPASE, AMYLASE in the last 168 hours. No results for input(s): AMMONIA in the last 168 hours. Coagulation Profile: Recent Labs  Lab 08/19/2021 1039  INR 1.0   Cardiac Enzymes: No results for input(s): CKTOTAL, CKMB, CKMBINDEX, TROPONINI in the last 168 hours. BNP (last 3 results) No results for input(s): PROBNP in the last 8760 hours. HbA1C: No results for input(s): HGBA1C in the last 72 hours. CBG: No results for input(s): GLUCAP in the last 168 hours. Lipid Profile: No results for input(s): CHOL, HDL, LDLCALC, TRIG, CHOLHDL, LDLDIRECT in the last 72 hours. Thyroid Function Tests: No results for input(s): TSH, T4TOTAL, FREET4, T3FREE, THYROIDAB in the last 72 hours. Anemia Panel: No results for input(s): VITAMINB12, FOLATE, FERRITIN, TIBC, IRON, RETICCTPCT in the last 72 hours. Urine analysis:    Component Value Date/Time   COLORURINE YELLOW 08/25/2021 1156   APPEARANCEUR CLOUDY (A) 08/25/2021 1156   LABSPEC 1.013 08/06/2021 1156   PHURINE 7.0 07/30/2021 1156   GLUCOSEU NEGATIVE 08/16/2021 1156   GLUCOSEU NEGATIVE 04/07/2021 1450   HGBUR SMALL (A) 08/07/2021 1156   BILIRUBINUR NEGATIVE 08/11/2021 1156   BILIRUBINUR negative 09/29/2020 1445   KETONESUR 5 (A) 08/06/2021 1156   PROTEINUR 30 (A) 08/26/2021 1156   UROBILINOGEN 0.2 04/07/2021  1450   NITRITE NEGATIVE 08/12/2021 1156   LEUKOCYTESUR TRACE (A) 08/13/2021 1156   Sepsis Labs: _0 (procalcitonin:4,lacticacidven:4)  ) Recent Results (from the past 240 hour(s))  Urine Culture     Status: Abnormal   Collection Time: 08/06/2021 10:27 AM   Specimen: In/Out Cath Urine  Result Value Ref Range Status   Specimen Description IN/OUT CATH URINE  Final   Special Requests   Final    NONE Performed at [facility], [place] [address], [place], [place] [address]    Culture 30,000 COLONIES/mL KLEBSIELLA PNEUMONIAE (A)  Final   Report Status 08/12/2021 FINAL  Final   Organism ID, Bacteria KLEBSIELLA PNEUMONIAE (A)  Final      Susceptibility   Klebsiella pneumoniae - MIC*    AMPICILLIN RESISTANT Resistant     CEFAZOLIN <=4 SENSITIVE Sensitive     CEFEPIME <=0.12 SENSITIVE Sensitive     CEFTRIAXONE <=0.25 SENSITIVE Sensitive     CIPROFLOXACIN <=0.25 SENSITIVE Sensitive     GENTAMICIN <=1 SENSITIVE Sensitive     IMIPENEM <=0.25 SENSITIVE Sensitive     NITROFURANTOIN 64 INTERMEDIATE Intermediate     TRIMETH/SULFA <=20 SENSITIVE Sensitive     AMPICILLIN/SULBACTAM 4 SENSITIVE Sensitive     PIP/TAZO <=4 SENSITIVE Sensitive     * 30,000 COLONIES/mL KLEBSIELLA PNEUMONIAE  Resp Panel by RT-PCR (Flu A&B, Covid) Nasopharyngeal Swab     Status: None   Collection Time: 08/08/2021 10:39 AM   Specimen: Nasopharyngeal Swab; Nasopharyngeal(NP) swabs in vial transport medium  Result Value Ref Range Status   SARS Coronavirus 2 by RT PCR NEGATIVE NEGATIVE Final    Comment: (NOTE) SARS-CoV-2 target nucleic acids are NOT DETECTED.  The SARS-CoV-2 RNA is generally detectable in upper respiratory specimens during the acute phase of infection. The lowest concentration of SARS-CoV-2 viral copies this assay can detect is 138 copies/mL. A negative result does not preclude SARS-Cov-2 infection and should not be used as the sole basis for treatment or other patient management decisions. A  negative result may occur with  improper specimen collection/handling, submission of specimen other than nasopharyngeal swab, presence of viral mutation(s) within the areas targeted by this assay, and inadequate number of viral copies(<138 copies/mL). A negative result must be combined with clinical observations, patient history, and epidemiological information. The expected result is Negative.  Fact Sheet for Patients:  EntrepreneurPulse.com.au  Fact Sheet for Healthcare Providers:  IncredibleEmployment.be  This test is no t yet approved or cleared by the [place] FDA and  has been authorized for detection and/or diagnosis of SARS-CoV-2 by FDA under an Emergency Use Authorization (EUA). This EUA will remain  in effect (meaning this test can be used) for the duration of the COVID-19 declaration under Section 564(b)(1) of the Act, 21 U.S.C.section 360bbb-3(b)(1), unless the authorization is terminated  or revoked sooner.       Influenza A by PCR NEGATIVE NEGATIVE Final   Influenza B by PCR NEGATIVE NEGATIVE Final    Comment: (NOTE) The Xpert Xpress SARS-CoV-2/FLU/RSV plus assay is intended as an aid in the diagnosis of influenza from Nasopharyngeal swab specimens and should not be used as a sole basis for treatment. Nasal washings and aspirates are unacceptable for Xpert Xpress SARS-CoV-2/FLU/RSV testing.  Fact Sheet for Patients: EntrepreneurPulse.com.au  Fact Sheet for Healthcare Providers: IncredibleEmployment.be  This test is not yet approved or cleared by the [place] FDA and has been authorized for detection and/or diagnosis of SARS-CoV-2 by FDA under an Emergency Use Authorization (EUA). This EUA will remain in effect (meaning this test can be used) for the duration of the COVID-19 declaration under Section 564(b)(1) of the Act, 21 U.S.C. section 360bbb-3(b)(1), unless the authorization  is terminated or revoked.  Performed at [facility], [place] [address] [place], [place] [address]   Blood Culture (routine x 2)     Status: None (Preliminary result)   Collection Time: 08/15/2021 10:39 AM   Specimen: BLOOD RIGHT HAND  Result Value Ref Range Status   Specimen Description BLOOD RIGHT HAND  Final   Special Requests   Final    BOTTLES DRAWN AEROBIC ONLY Blood Culture results may not be optimal due to an inadequate volume of blood received in culture bottles   Culture   Final    NO GROWTH 2 DAYS Performed at [facility], [place] [address], [place], [place] [address]    Report Status PENDING  Incomplete  Blood Culture (routine x 2)     Status: None (Preliminary result)   Collection Time: 08/01/2021 10:39 AM   Specimen: BLOOD  Result Value Ref Range Status   Specimen Description BLOOD RIGHT ANTECUBITAL  Final   Special Requests   Final    BOTTLES DRAWN AEROBIC AND ANAEROBIC Blood Culture adequate volume   Culture   Final    NO GROWTH 2 DAYS Performed at [facility], [place] [address], [place], [place] [address]    Report Status PENDING  Incomplete         Radiology Studies: No results found.      Scheduled Meds:  amLODipine  5 mg Oral Daily   aspirin EC  81 mg Oral Daily   atorvastatin  40 mg Oral QHS   buPROPion  300 mg Oral Daily   clopidogrel  75 mg Oral Daily   enoxaparin (LOVENOX) injection  40 mg Subcutaneous Q24H   hydrALAZINE  25 mg Oral BID   metoprolol succinate  100 mg Oral Daily   sodium chloride flush  3 mL Intravenous Q12H   topiramate  100 mg Oral BID   venlafaxine XR  150 mg Oral Q breakfast   And   venlafaxine XR  75 mg Oral QHS   cyanocobalamin  1,000 mcg Oral Daily   Continuous Infusions:  sodium chloride     cefTRIAXone (ROCEPHIN)  IV 1 g (08/12/21 1009)     LOS: 2 days    Time spent: 25 minutes    Edwin Dada, MD [facility] 08/12/2021, 1:51 PM     Please page though [place] or Epic  secure chat:  For [employer], Adult nurse

## 2021-08-12 NOTE — Progress Notes (Signed)
   08/12/21 1223  Assess: MEWS Score  Temp 98.7 F (37.1 C)  BP (!) 149/95  Pulse Rate (!) 108  ECG Heart Rate (!) 108  Resp (!) 21  SpO2 97 %  O2 Device Room Air  Assess: MEWS Score  MEWS Temp 0  MEWS Systolic 0  MEWS Pulse 1  MEWS RR 1  MEWS LOC 0  MEWS Score 2  MEWS Score Color Yellow  Assess: if the MEWS score is Yellow or Red  Were vital signs taken at a resting state? Yes  Focused Assessment No change from prior assessment  Early Detection of Sepsis Score *See Row Information* Low  MEWS guidelines implemented *See Row Information* Yes  Treat  MEWS Interventions Escalated (See documentation below)  Pain Scale 0-10  Pain Score 0  Take Vital Signs  Increase Vital Sign Frequency  Yellow: Q 2hr X 2 then Q 4hr X 2, if remains yellow, continue Q 4hrs  Escalate  MEWS: Escalate Yellow: discuss with charge nurse/RN and consider discussing with provider and RRT  Notify: Charge Nurse/RN  Name of Charge Nurse/RN Notified Alvy Bimler RN  Date Charge Nurse/RN Notified 08/12/21  Time Charge Nurse/RN Notified 44  Notify: Provider  Provider Name/Title Dr. Loleta Books  Date Provider Notified 08/12/21  Time Provider Notified 1330  Notification Type Page (Secure Chat message)  Notification Reason Change in status (Yellow MEWS)  Provider response No new orders  Date of Provider Response 08/12/21  Time of Provider Response 1338

## 2021-08-12 NOTE — Progress Notes (Signed)
   08/12/21 1353  Assess: MEWS Score  Temp 98.5 F (36.9 C)  BP (!) 132/91  Pulse Rate (!) 110  ECG Heart Rate (!) 110  Resp (!) 23  SpO2 97 %  Assess: MEWS Score  MEWS Temp 0  MEWS Systolic 0  MEWS Pulse 1  MEWS RR 1  MEWS LOC 0  MEWS Score 2  MEWS Score Color Yellow  Assess: if the MEWS score is Yellow or Red  Were vital signs taken at a resting state? Yes  Focused Assessment No change from prior assessment  Early Detection of Sepsis Score *See Row Information* Low  MEWS guidelines implemented *See Row Information* No, previously yellow, continue vital signs every 4 hours

## 2021-08-12 NOTE — Progress Notes (Signed)
  Speech Language Pathology Treatment: Dysphagia  Patient Details Name: Michaela Rios MRN: [id number] DOB: 1950/12/02 Today's Date: 08/12/2021 Time: [id number] SLP Time Calculation (min) (ACUTE ONLY): 14 min  Assessment / Plan / Recommendation Clinical Impression  Pt was seen for dysphagia treatment. She was adequately alert for p.o. intake, but her level of alertness was suboptimal with her occasionally closing her eyes. Pt maintained adequate alertness during p.o. intake and no s/sx of aspiration were noted with solids or liquids. Pt demonstrated mildly prolonged mastication of regular texture solids which pt reported is her baseline since she "always eats slow". Oral clearance was WNL. Pt's diet will be advanced to regular texture solids and thin liquids. Considering her presentation today, SLP will follow briefly to ensure tolerance.    HPI HPI: Michaela Rios is a 70 y.o. female PMHx as reviewed below, recent stroke discharged 06/04/2021. After rehabilitation her husband said that there was incremental improvement. Last Thursday (08/05/2021) during therapy she was able to walk as well as at home however, the ensuing days she progressively developed generalized weakness to the point that she could not even sit up straight. Difficulty swallowing also reported. During prior admission swallow function was evaluated 6/30 with f/u on 7/08 revealing swallow WFL and tolerance of regular diet and thin liquids with no f/u needed for swallowing.      SLP Plan  Continue with current plan of care       Recommendations  Diet recommendations: Regular;Thin liquid Liquids provided via: Cup;Straw Medication Administration: Whole meds with puree (per pt's preference) Supervision: Staff to assist with self feeding Compensations: Minimize environmental distractions;Slow rate;Small sips/bites Postural Changes and/or Swallow Maneuvers: Seated upright 90 degrees                Oral Care  Recommendations: Oral care BID Follow up Recommendations: 24 hour supervision/assistance SLP Visit Diagnosis: Dysphagia, oral phase (R13.11) Plan: Continue with current plan of care       Taralee Marcus I. [name], [place], [place] Office number [phone] Pager [place] 08/12/2021, 10:11 AM

## 2021-08-12 NOTE — Evaluation (Signed)
Occupational Therapy Evaluation Patient Details Name: Michaela Rios MRN: [id number] DOB: 1951/02/16 Today's Date: 08/12/2021   History of Present Illness Pt is a 70yo female presenting to [facility] ED on 9/13 with weakness, poor PO inntake, found to be febrile in ED. Workup for sepsis, unclear etiology. Pt with admission July of 2022 for CVA. Per neuro notes, acute/subacute left posterior putamen and caudate head on MRI on 9/4. 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   Clinical Impression   Pt is a poor historian, prior level functioning information gathered in chart. PTA, pt was living with her husband. She used a cane and rollator for ambulation and she was independent with dressing. Pt's husband assisted with all other ADL/IADL. Pt currently limited due to lethargy, keeping eyes shut majority of session. Pt requires totalA for rolling in bed and for all ADL. She demonstrates bilateral weakness and appears to be weaker in RUE and LLE this session. Pt keeping head turned to left and unable to progress to midline secondary to increased pain. Currently recommend SNF for sub-acute therapy to progress with safety and independence. Will continue to follow acutely.       Recommendations for follow up therapy are one component of a multi-disciplinary discharge planning process, led by the attending physician.  Recommendations may be updated based on patient status, additional functional criteria and insurance authorization.   Follow Up Recommendations  SNF;Supervision/Assistance - 24 hour    Equipment Recommendations  Other (comment) (to be determined at next venue)    Recommendations for Other Services       Precautions / Restrictions Precautions Precautions: Fall      Mobility Bed Mobility Overal bed mobility: Needs Assistance Bed Mobility: Rolling Rolling: Total assist         General bed mobility comments: totalA for cueing and physical assistance, pt able to  bend RLE but not actively assisting with rolling;    Transfers                 General transfer comment: deferred secondary to safety    Balance       Sitting balance - Comments: deferred                                   ADL either performed or assessed with clinical judgement   ADL Overall ADL's : Needs assistance/impaired                                       General ADL Comments: totalA for all ADL this session, pt required assistance to hold cup;noted pt to be holding food in her mouth upon OT arrival.     Vision         Perception     Praxis      Pertinent Vitals/Pain Pain Assessment: No/denies pain Pain Intervention(s): Monitored during session     Hand Dominance Right   Extremity/Trunk Assessment Upper Extremity Assessment Upper Extremity Assessment: RUE deficits/detail;LUE deficits/detail RUE Deficits / Details: RUE demonstrated decreased ROM and strength compared to LUE;shoulder flexion AAROM about 160*;grossly 3-/5 RUE Sensation: WNL RUE Coordination: decreased gross motor;decreased fine motor LUE Deficits / Details: ROM WFL, strength 3/5 grossly. able to reach end range AAROM shoulder flexion LUE Sensation: WNL LUE Coordination: decreased gross motor   Lower Extremity Assessment Lower Extremity  Assessment: Defer to PT evaluation RLE Deficits / Details: RLE noted to be stronger than LLE RLE Coordination: decreased gross motor LLE Coordination: decreased gross motor   Cervical / Trunk Assessment Cervical / Trunk Assessment: Kyphotic;Other exceptions Cervical / Trunk Exceptions: Pt with head rotated toward the left with tight SCM bilaterally; pt reports painful and unable to actively rotate head to near midline despite assistance from therapist due to increased pain. further testing deferred   Communication Communication Communication: Other (comment) (Pt required increased time to respond, sometimes with  multiple prompts. soft vocal quality)   Cognition Arousal/Alertness: Lethargic Behavior During Therapy: Flat affect Overall Cognitive Status: No family/caregiver present to determine baseline cognitive functioning                                 General Comments: pt oriented to self, year, place;pt unable to state year she was born and responded "no" when therapist confirmed year. Pt with poor command following this session requiring increased time and effort. Pt keeping eyes shut majority of session   General Comments  Hr up to 117bpm with rolling in bed, HR 110bpm at rest;SpO2 >90% on RA    Exercises     Shoulder Instructions      Home Living Family/patient expects to be discharged to:: Private residence Living Arrangements: Spouse/significant other Available Help at Discharge: Family;Available 24 hours/day Type of Home: House Home Access: Stairs to enter [employer] of Steps: 3 from the front, 7 from the back Entrance Stairs-Rails: Right (no railing in the front, right railing in the back) Home Layout: One level     Bathroom Shower/Tub: Occupational psychologist: Handicapped height Bathroom Accessibility: Yes   Home Equipment: Shower seat;Grab bars - tub/shower;Walker - 4 wheels   Additional Comments: information gathered from chart, pt is a poor historian Husband retired and available for 24/7 assist      Prior Functioning/Environment Level of Independence: Needs assistance  Gait / Transfers Assistance Needed: Uses cane and rollator for ambulation ADL's / Homemaking Assistance Needed: Husband does cooking, assists with bathing. IND with dressing.   Comments: pt is a poor historian, information gathered per chartIndependent without AD; enjoys reading        OT Problem List: Decreased activity tolerance;Impaired balance (sitting and/or standing);Decreased safety awareness;Decreased knowledge of use of DME or AE;Decreased knowledge of  precautions;Impaired UE functional use      OT Treatment/Interventions: Self-care/ADL training;Therapeutic exercise;Energy conservation;DME and/or AE instruction;Therapeutic activities;Cognitive remediation/compensation;Balance training;Patient/family education    OT Goals(Current goals can be found in the care plan section) Acute Rehab OT Goals Patient Stated Goal: to sleep OT Goal Formulation: With patient Time For Goal Achievement: 08/26/21 Potential to Achieve Goals: Fair ADL Goals Pt Will Perform Eating: with set-up;sitting Pt Will Perform Grooming: with set-up;sitting Pt Will Transfer to Toilet: with min assist;stand pivot transfer Additional ADL Goal #1: Pt will follow 2-3step commands consistently during ADL/IADL.  OT Frequency: Min 2X/week   Barriers to D/C:            Co-evaluation              AM-PAC OT "6 Clicks" Daily Activity     Outcome Measure Help from another person eating meals?: Total Help from another person taking care of personal grooming?: Total Help from another person toileting, which includes using toliet, bedpan, or urinal?: Total Help from another person bathing (including washing, rinsing, drying)?: Total  Help from another person to put on and taking off regular upper body clothing?: Total Help from another person to put on and taking off regular lower body clothing?: Total 6 Click Score: 6   End of Session Nurse Communication: Mobility status  Activity Tolerance: Patient tolerated treatment well;Patient limited by fatigue Patient left: in bed;with call bell/phone within reach;with bed alarm set;Other (comment) (in chair position)  OT Visit Diagnosis: Other abnormalities of gait and mobility (R26.89);Muscle weakness (generalized) (M62.81);Other symptoms and signs involving cognitive function                Time: [id number] OT Time Calculation (min): 18 min Charges:  OT General Charges $OT Visit: 1 Visit OT Evaluation $OT Eval Moderate  Complexity: [place] OTR/L Acute Rehabilitation Services Office: [place] 08/12/2021, 1:13 PM

## 2021-08-12 NOTE — Progress Notes (Signed)
IP rehab admissions - I spoke with patient at the bedside.  She asked me to call her spouse.  Call placed to Levada Dy and 2 messages left today to return my call.  I will have my partner follow up again tomorrow.  Call for questions.  [phone]

## 2021-08-13 ENCOUNTER — Inpatient Hospital Stay (HOSPITAL_COMMUNITY): Payer: Medicare HMO

## 2021-08-13 DIAGNOSIS — R509 Fever, unspecified: Secondary | ICD-10-CM

## 2021-08-13 DIAGNOSIS — I6310 Cerebral infarction due to embolism of unspecified precerebral artery: Secondary | ICD-10-CM

## 2021-08-13 DIAGNOSIS — I639 Cerebral infarction, unspecified: Secondary | ICD-10-CM

## 2021-08-13 DIAGNOSIS — I10 Essential (primary) hypertension: Secondary | ICD-10-CM

## 2021-08-13 DIAGNOSIS — G43909 Migraine, unspecified, not intractable, without status migrainosus: Secondary | ICD-10-CM | POA: Diagnosis not present

## 2021-08-13 DIAGNOSIS — G9341 Metabolic encephalopathy: Secondary | ICD-10-CM | POA: Diagnosis not present

## 2021-08-13 LAB — BASIC METABOLIC PANEL
Anion gap: 11 (ref 5–15)
BUN: 20 mg/dL (ref 8–23)
CO2: 18 mmol/L — ABNORMAL LOW (ref 22–32)
Calcium: 8.7 mg/dL — ABNORMAL LOW (ref 8.9–10.3)
Chloride: 105 mmol/L (ref 98–111)
Creatinine, Ser: 0.77 mg/dL (ref 0.44–1.00)
GFR, Estimated: >60 mL/min (ref >60)
Glucose, Bld: 128 mg/dL — ABNORMAL HIGH (ref 70–99)
Potassium: 4.4 mmol/L (ref 3.5–5.1)
Sodium: 134 mmol/L — ABNORMAL LOW (ref 135–145)

## 2021-08-13 LAB — CBC
HCT: 40.7 % (ref 36.0–46.0)
Hemoglobin: 13.4 g/dL (ref 12.0–15.0)
MCH: 27.9 pg (ref 26.0–34.0)
MCHC: 32.9 g/dL (ref 30.0–36.0)
MCV: 84.6 fL (ref 80.0–100.0)
Platelets: 442 10*3/uL — ABNORMAL HIGH (ref 150–400)
RBC: 4.81 MIL/uL (ref 3.87–5.11)
RDW: 15.9 % — ABNORMAL HIGH (ref 11.5–15.5)
WBC: 9.2 10*3/uL (ref 4.0–10.5)
nRBC: 0 % (ref 0.0–0.2)

## 2021-08-13 LAB — PARASITE EXAM SCREEN, BLOOD-W CONF TO LABCORP (NOT @ ARMC)

## 2021-08-13 LAB — FERRITIN: Ferritin: 354 ng/mL — ABNORMAL HIGH (ref 11–307)

## 2021-08-13 MED ORDER — LACTATED RINGERS IV SOLN: INTRAVENOUS | Status: DC

## 2021-08-13 MED ORDER — VENLAFAXINE HCL ER 75 MG PO CP24
75.0000 mg | ORAL_CAPSULE | Freq: Every day | ORAL | Status: DC
  Administered 2021-08-14 – 2021-08-15 (×2): 75 mg via ORAL
  Filled 2021-08-13 (×2): qty 1

## 2021-08-13 MED ORDER — BUPROPION HCL ER (XL) 150 MG PO TB24
150.0000 mg | ORAL_TABLET | Freq: Every day | ORAL | Status: DC
  Administered 2021-08-14 – 2021-08-18 (×5): 150 mg via ORAL
  Filled 2021-08-13 (×6): qty 1

## 2021-08-13 NOTE — TOC Progression Note (Addendum)
Dr Loleta Books Transition of Care [facility]) - Progression Note    Patient Details  Name: Michaela Rios MRN: [id number] Date of Birth: 06/10/1951  Transition of Care [facility]) CM/SW [place], RN Phone Number: 08/13/2021, 1:11 PM  Clinical Narrative:     Dr Loleta Books called  Neurology at [facility] , they would like the facesheet faxed over to set up possible transfer.  Their fax is [phone] for assessment of specialty services, in tertiary care setting. Discussed with Dr. Loleta Books, Will talk to family about consent for medical information to fax.  LM  confidential VM  for spouse  Levada Dy to call this [place] CSW spoke to spouse in room, verbal permission given for fax of information to [facility] for follow up Faxed facesheet to [employer] at [phone]       Expected Discharge Plan and Services                                                 Social Determinants of Health (SDOH) Interventions    Readmission Risk Interventions No flowsheet data found.

## 2021-08-13 NOTE — Consult Note (Signed)
[place] for Infectious Disease    Date of Admission:  08/24/2021     Total days of antibiotics 4  Ceftriaxone 9/15  Vanc + Aztreonam + Metronidazole              Reason for Consult: FUO    Referring Provider: [place] Primary Care Provider: Binnie Rail, MD   Assessment: Michaela Rios is a 70 y.o. female with 3 month illness characterized by waxing and waning global weakness, solomnence, poor intake in the setting of strokes. Two week hospitalization in June - July where she presented with weakness and found to have fever/SIRS, acute bbihemispheric strokes. Infectious work up at that time was unremarkable including negative C/A/P CT scan, blood cultures, benign LP (3 WBC, Pr 82, Glu 52, Cx negative, Crypto -, VDRL -). Autoimmune encephalitis panel negative aside from slightly elevated GAD65 Ab (unclear of this significance). No localizing symptoms per her husband, though she is difficult to get history from now. Rheumatologic work up largely negative aside from (+) ANA without reflex titer. She was given 3 days of IV steroids which seemed to abate her fevers; I don't see where they recurred during her stay on rehab and from her husband's report it appears she was not having fever at home prior to current admission.   Given the duration of her illness I don't suspect that her fevers are due to an infectious cause. Previous LP does not reflect any concern for atypical chronic meningoencephalitis. MRI repeated 9/4 with new areas of acute ischemia in basal ganglia. This admission +fever without SIRS - no organ dysfunction, no abnormal cell lines, slightly tachycardic (in the setting of a week of decreasing PO intake), normal lactic acid.   Will re-send some rheumatologic serologies, ferratin, quantiferon and blood smear. Agree with stopping antibiotics and observing. ?PET scan vs tagged WBC scan vs Karius test (patient/husband will need to provide consent d/t out of pocket  cost) to pursue if she continues to have fevers (overall seems largely improved with TMax last 24h 100.1).  Dr. Gale Journey to provide further recommendations for Michaela Rios.    Plan: Repeat RF, ANCA, CCP, ANA with reflex titer  Quantiferon GOLD and blood smear Agree to stop antibiotics     Active Problems:   Anxiety   Depression   Essential hypertension   GERD   Migraines   Sepsis secondary to UTI ([place])   Weakness   Cerebral embolism with cerebral infarction    amLODipine  5 mg Oral Daily   aspirin EC  81 mg Oral Daily   atorvastatin  40 mg Oral QHS   [START ON 08/14/2021] buPROPion  150 mg Oral Daily   clopidogrel  75 mg Oral Daily   enoxaparin (LOVENOX) injection  40 mg Subcutaneous Q24H   metoprolol succinate  100 mg Oral Daily   sodium chloride flush  3 mL Intravenous Q12H   topiramate  100 mg Oral BID   [START ON 08/14/2021] venlafaxine XR  75 mg Oral Daily   cyanocobalamin  1,000 mcg Oral Daily    HPI: Michaela Rios is a 70 y.o. female admitted from home via EMS for evaluation after she had a rapid functional decline at home.   PMHx severe migraines, recurrent cystitis/UTI episodes recently, HTN, GERD, anxiety/depression.   Michaela Rios is not able to verbally contribute to today's interview. HPI obtained from husband and chart review.   Review of records:  In normal health  up until hospitalization 6/27 - 7/12 for mechanical fall, weakness, failure to thrive >> SIRS criteria with fevers at that encounter with extensive work up. Localized complaints to left flank pain as well as generalized weakness L>R. Infectious work up at this time also included LP>> Protein 82, 3 WBC, Cx negative. Crypto Ag negative, VDRL and HSV negative. Blood cultures negative. COVID negative. Autoimmune encephalitis panel negative aside from a mildly elevated GAD65 Ab.  Repeated brain MRI 7/5 with numerous small acute b/l cerebral and cerebellar infarcts. ID team evaluation strongly suspected  vasculitis at that time and stopped antibiotics. Steroids x 3d were given that seemed to abate her fevers at that time.  She had radial cerebral angiogram negative for vasculitis, AVM, stenosis, dural AV fistula significant vascular normality.  Repeat brain MRI showed numerous small acute bilateral cerebral and cerebellar infarcts consistent with emboli and expected evolution of her right basal ganglia infarct.  TEE done 7/7 was negative for endocarditis that showed large patent PFO was not thought that this is likely etiology of her strokes.  Consideration was given to lymphoma but was eventually felt to be low probability.  Stayed in inpatient rehab through 8/04 with discharge home in the care of her husband. Fevers had resolved throughout inpatient rehab stay. Her husband says that the 2 weeks home from rehab were going great. She was walking, talking, laughing and feeding herself and things seemed to be continuing to improve. At an outpatient follow up in early August she was noted to have had overall improvement in mentation, still balance deficits, delayed processing and improved weakness. Appetite was improved. Walking with cane and could toilet herself and provide assisted simple ADLs.   Repeated outpatient MRI 9/5 noted to have new areas indicating acute ischemia concerning for new strokes. Since then her husband describes a waxing/waning period her functional capacity, appetite and overall ability to contribute to self care up until last Thursday. She was taken to hospital on 9/13 for evaluation in the setting of acutely worsening weakness and debilty.  When Michaela Rios was brought to the ER she was unable to provide history. Current illness started 9/08 with generalized weakness and decreased responsiveness. No fevers noted at home but febrile to 102 F when EMS came to see her. Has been wearing diaper, uncertain if she has any urinary symptoms. Started on vancomycin + aztreonam + metronidazole x 2d  then continued on ceftriaxone alone 9/15 after BCx no growth. With 30,000 cfu klebsiella pneumoniae for which she has continued ceftriaxone.  No formal recurrence of fevers since admission, last 24h TMax 100.1 F   Neurology has been consulted - brought up possibility of temperature dysregulation d/t mesencephalothalamic lesions present bilaterally vs central hyperthermia from brain injuries.    Review of Systems: Review of Systems  Unable to perform ROS: Mental status change   Past Medical History:  Diagnosis Date   Anemia    Anxiety    Arthritis    Cataract    Chronic migraine    follows with neuro for same   Clotting disorder (HCC)    clot in ovarian vein- hx   Esophagitis    GERD (gastroesophageal reflux disease)    Hypertension    IBS (irritable bowel syndrome)    Low back pain syndrome    MRSA (methicillin resistant Staphylococcus aureus) infection 04/17/12   left torso   Peptic ulcer disease 2003, 12/2012   Peripheral vascular disease ([place])    Vitamin D deficiency  Social History   Tobacco Use   Smoking status: Never   Smokeless tobacco: Never  Vaping Use   Vaping Use: Never used  Substance Use Topics   Alcohol use: Yes    Comment: rare   Drug use: No    Family History  Problem Relation Age of Onset   Aneurysm Father    Prostate cancer Brother    Lung cancer Brother    Colon cancer Neg Hx    Esophageal cancer Neg Hx    Rectal cancer Neg Hx    Stomach cancer Neg Hx    Allergies  Allergen Reactions   Amitriptyline Hcl     Changed personality   Shellfish Allergy Nausea And Vomiting and Other (See Comments)    Tested "high" on allergy test   Lisinopril Cough    .   Amoxicillin Rash   Cephalexin Rash   Penicillins Rash    OBJECTIVE: Blood pressure (!) 121/92, pulse (!) 125, temperature 99.1 F (37.3 C), resp. rate (!) 21, height '5\' 6"'$  (1.676 m), weight 54.5 kg, SpO2 95 %.  Physical Exam Vitals reviewed.  Constitutional:      Appearance:  She is well-developed.     Comments: Resting quietly in bed. Chronically ill appearing. Deconditioned.   HENT:     Mouth/Throat:     Mouth: Mucous membranes are moist. No oral lesions.     Dentition: Normal dentition. No dental abscesses.     Pharynx: No oropharyngeal exudate.  Cardiovascular:     Rate and Rhythm: Regular rhythm. Tachycardia present.     Heart sounds: Normal heart sounds.  Pulmonary:     Effort: Pulmonary effort is normal.     Breath sounds: Normal breath sounds.  Abdominal:     General: There is no distension.     Palpations: Abdomen is soft.     Tenderness: There is no abdominal tenderness.  Lymphadenopathy:     Cervical: No cervical adenopathy.  Skin:    General: Skin is warm and dry.     Findings: No rash.  Neurological:     Mental Status: She is alert.     Motor: Weakness present.     Comments: She is not verbally responding to me. Does make eye contact and seemed to wave when I walked in with right hand.   Psychiatric:        Judgment: Judgment normal.     Comments: In good spirits today and engaged in care discussion    Lab Results Lab Results  Component Value Date   WBC 9.2 08/13/2021   HGB 13.4 08/13/2021   HCT 40.7 08/13/2021   MCV 84.6 08/13/2021   PLT 442 (H) 08/13/2021    Lab Results  Component Value Date   CREATININE 0.77 08/13/2021   BUN 20 08/13/2021   NA 134 (L) 08/13/2021   K 4.4 08/13/2021   CL 105 08/13/2021   CO2 18 (L) 08/13/2021    Lab Results  Component Value Date   ALT 12 08/07/2021   AST 26 08/17/2021   ALKPHOS 66 08/23/2021   BILITOT 0.8 08/11/2021     Microbiology: Recent Results (from the past 240 hour(s))  Urine Culture     Status: Abnormal   Collection Time: 08/24/2021 10:27 AM   Specimen: In/Out Cath Urine  Result Value Ref Range Status   Specimen Description IN/OUT CATH URINE  Final   Special Requests   Final    NONE Performed at [facility], [address] [place], [place] [address]    Culture  30,000 COLONIES/mL KLEBSIELLA PNEUMONIAE (A)  Final   Report Status 08/12/2021 FINAL  Final   Organism ID, Bacteria KLEBSIELLA PNEUMONIAE (A)  Final      Susceptibility   Klebsiella pneumoniae - MIC*    AMPICILLIN RESISTANT Resistant     CEFAZOLIN <=4 SENSITIVE Sensitive     CEFEPIME <=0.12 SENSITIVE Sensitive     CEFTRIAXONE <=0.25 SENSITIVE Sensitive     CIPROFLOXACIN <=0.25 SENSITIVE Sensitive     GENTAMICIN <=1 SENSITIVE Sensitive     IMIPENEM <=0.25 SENSITIVE Sensitive     NITROFURANTOIN 64 INTERMEDIATE Intermediate     TRIMETH/SULFA <=20 SENSITIVE Sensitive     AMPICILLIN/SULBACTAM 4 SENSITIVE Sensitive     PIP/TAZO <=4 SENSITIVE Sensitive     * 30,000 COLONIES/mL KLEBSIELLA PNEUMONIAE  Resp Panel by RT-PCR (Flu A&B, Covid) Nasopharyngeal Swab     Status: None   Collection Time: 08/19/2021 10:39 AM   Specimen: Nasopharyngeal Swab; Nasopharyngeal(NP) swabs in vial transport medium  Result Value Ref Range Status   SARS Coronavirus 2 by RT PCR NEGATIVE NEGATIVE Final    Comment: (NOTE) SARS-CoV-2 target nucleic acids are NOT DETECTED.  The SARS-CoV-2 RNA is generally detectable in upper respiratory specimens during the acute phase of infection. The lowest concentration of SARS-CoV-2 viral copies this assay can detect is 138 copies/mL. A negative result does not preclude SARS-Cov-2 infection and should not be used as the sole basis for treatment or other patient management decisions. A negative result may occur with  improper specimen collection/handling, submission of specimen other than nasopharyngeal swab, presence of viral mutation(s) within the areas targeted by this assay, and inadequate number of viral copies(<138 copies/mL). A negative result must be combined with clinical observations, patient history, and epidemiological information. The expected result is Negative.  Fact Sheet for Patients:  EntrepreneurPulse.com.au  Fact Sheet for Healthcare  Providers:  IncredibleEmployment.be  This test is no t yet approved or cleared by the [place] FDA and  has been authorized for detection and/or diagnosis of SARS-CoV-2 by FDA under an Emergency Use Authorization (EUA). This EUA will remain  in effect (meaning this test can be used) for the duration of the COVID-19 declaration under Section 564(b)(1) of the Act, 21 U.S.C.section 360bbb-3(b)(1), unless the authorization is terminated  or revoked sooner.       Influenza A by PCR NEGATIVE NEGATIVE Final   Influenza B by PCR NEGATIVE NEGATIVE Final    Comment: (NOTE) The Xpert Xpress SARS-CoV-2/FLU/RSV plus assay is intended as an aid in the diagnosis of influenza from Nasopharyngeal swab specimens and should not be used as a sole basis for treatment. Nasal washings and aspirates are unacceptable for Xpert Xpress SARS-CoV-2/FLU/RSV testing.  Fact Sheet for Patients: EntrepreneurPulse.com.au  Fact Sheet for Healthcare Providers: IncredibleEmployment.be  This test is not yet approved or cleared by the [place] FDA and has been authorized for detection and/or diagnosis of SARS-CoV-2 by FDA under an Emergency Use Authorization (EUA). This EUA will remain in effect (meaning this test can be used) for the duration of the COVID-19 declaration under Section 564(b)(1) of the Act, 21 U.S.C. section 360bbb-3(b)(1), unless the authorization is terminated or revoked.  Performed at [facility], [place] [address] [place], [place] [address]   Blood Culture (routine x 2)     Status: None (Preliminary result)   Collection Time: 08/24/2021 10:39 AM   Specimen: BLOOD RIGHT HAND  Result Value Ref Range Status  Specimen Description BLOOD RIGHT HAND  Final   Special Requests   Final    BOTTLES DRAWN AEROBIC ONLY Blood Culture results may not be optimal due to an inadequate volume of blood received in culture bottles   Culture    Final    NO GROWTH 3 DAYS Performed at [facility], [place] [address], [place], [place] [address]    Report Status PENDING  Incomplete  Blood Culture (routine x 2)     Status: None (Preliminary result)   Collection Time: 08/14/2021 10:39 AM   Specimen: BLOOD  Result Value Ref Range Status   Specimen Description BLOOD RIGHT ANTECUBITAL  Final   Special Requests   Final    BOTTLES DRAWN AEROBIC AND ANAEROBIC Blood Culture adequate volume   Culture   Final    NO GROWTH 3 DAYS Performed at [facility], [place] [address] [place], [place] [address]    Report Status PENDING  Incomplete    Janene Madeira, MSN, NP-C [place] for Infectious [place] Pager: [phone]  08/13/2021 1:26 PM

## 2021-08-13 NOTE — Progress Notes (Signed)
Inpatient Rehab Admissions Coordinator:   Pt. Is not doing enough for CIR at this time, but may progress to being a CIR candidate if participation with therapies improve. Discussed with husband and he states understanding.   Clemens Catholic, [place], [place] Admissions Coordinator  [phone] ([place]) [phone] (office)

## 2021-08-13 NOTE — Progress Notes (Signed)
TCD with bubbles and bilateral lower extremity venous duplex has been completed. Preliminary results can be found in CV Proc through chart review.   08/13/21 3:08 PM Michaela Rios RVT

## 2021-08-13 NOTE — Care Management Important Message (Signed)
Important Message  Patient Details  Name: Michaela Rios MRN: [id number] Date of Birth: Dec 19, 1950   Medicare Important Message Given:  Yes     Orbie Pyo 08/13/2021, 3:20 PM

## 2021-08-13 NOTE — Progress Notes (Signed)
Speech Language Pathology Treatment: Dysphagia  Patient Details Name: Michaela Rios MRN: [id number] DOB: May 31, 1951 Today's Date: 08/13/2021 Time: [id number] SLP Time Calculation (min) (ACUTE ONLY): 15 min  Assessment / Plan / Recommendation Clinical Impression  Pt was seen during lunch for dysphagia treatment. Pt's husband was present and feeding pt banana pudding with her reclined. Pt continues to demonstrate a left head turn position throughout the session. She was adequately alert and cooperative during the session, but verbal output was more reduced than that noted on 9/15. Mastication was notably more prolonged than when she was last seen and, despite cues, mastication extended beyond the point of necessity. Pt ultimately swallowed regular texture boluses once liquids were given. Mild to moderate residue was noted thereafter which was cleared with additional liquid washes. Pt tolerated dysphagia 2 solids with more functional mastication and oral clearance. Pt's husband stated that the pt has always eaten somewhat slowly, but that her presentation today is notably worse than baseline. Pt's performance today is worse than on 9/15 and her diet will be downgraded to dysphagia 2 and thin liquids. SLP will continue to follow pt.    HPI HPI: Michaela Rios is a 70 y.o. female with recent stroke discharged 06/04/2021. After rehabilitation her husband said that there was incremental improvement. Last Thursday (08/05/2021) during therapy she was able to walk as well as at home however, the ensuing days she progressively developed generalized weakness to the point that she could not even sit up straight. Difficulty swallowing also reported. Dx acute metabolic encephalopathy,  sepsis, unclear etiology.  MRI brain 9/4 showed new restricted diffusion consistent with new strokes in left BG. Neurology felt these are statistically more likely to be related to risk factors of hypertension, hyperlipidemia. During  prior admission swallow function was evaluated 6/30 with f/u on 7/08 revealing swallow WFL and tolerance of regular diet and thin liquids with no f/u needed for swallowing. 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      SLP Plan  Continue with current plan of care      Recommendations for follow up therapy are one component of a multi-disciplinary discharge planning process, led by the attending physician.  Recommendations may be updated based on patient status, additional functional criteria and insurance authorization.    Recommendations  Diet recommendations: Dysphagia 2 (fine chop);Thin liquid Liquids provided via: Cup;Straw Medication Administration: Whole meds with puree (per pt's preference) Supervision: Staff to assist with self feeding Compensations: Minimize environmental distractions;Slow rate;Small sips/bites Postural Changes and/or Swallow Maneuvers: Seated upright 90 degrees                Oral Care Recommendations: Oral care BID Follow up Recommendations: 24 hour supervision/assistance SLP Visit Diagnosis: Dysphagia, oral phase (R13.11) Plan: Continue with current plan of care       Ismael Karge I. [name], [place], [place] Office number [phone] Pager [place]  08/13/2021, 2:55 PM

## 2021-08-13 NOTE — TOC Initial Note (Signed)
Transition of Care [facility]) - Initial/Assessment Note    Patient Details  Name: Michaela Rios MRN: [id number] Date of Birth: 12/09/1950  [facility] CM/SW Contact:    Benard Halsted, LCSW Phone Number: 08/13/2021, 3:34 PM  Clinical Narrative:                 CSW received consult for possible SNF placement at time of discharge. CSW spoke with patient's spouse at bedside. He reported that he is currently unable to care for patient at their home given patient's current physical needs and fall risk. He expressed understanding of PT recommendation and is agreeable to SNF placement at time of discharge since [facility] is unable to accept patient. CSW discussed insurance authorization process and provided Medicare SNF ratings list and SNF bed offers. Patient has received 3 COVID vaccines. Patient's spouse expressed being hopeful for rehab and for patient to feel better soon.   Update: Patient's spouse has selected [facility]. They are able to accept patient if she receives a covid booster in the hospital. They will begin insurance authorization, but they will be closed on the weekend. CSW to touch base with them and send updated therapy notes on Monday.   Skilled Nursing Rehab Facilities-   [email or web address] Ratings out of 5 possible    Name Address  Phone # [place] [employer] Overall  [facility] [address] [place] '5 1 4 4  '$ [facility]  [address], [place] [phone] '3 1 5 4  '$ [facility] [place], [place] '3 1 1 1  '$ [place], [place] '2 2 4 4  '$ [facility] [address], [place] '3 1 2 1  '$ [facility] [address] [place] '3 2 4 4  '$ [facility] [address], [place] '5 1 2 2  '$ [facility] [address] [place] '5 2 2 3   '$ [facility] [place] '5 1 2 2  '$ [facility] [phone], [place] [phone] '5 1 2 2  '$ [facility] [address], [facility] [phone] '5 1 2 2  '$ [facility] [address] [phone] '3 1 1 1     '$ Expected Discharge Plan: Skilled Nursing Facility Barriers to Discharge: Insurance Authorization   Patient Goals and CMS Choice Patient states their goals for this hospitalization and ongoing recovery are:: rehab CMS Medicare.gov Compare Post Acute Care list provided to:: Patient Represenative (must comment) Choice offered to / list presented to : Spouse  Expected Discharge Plan and Services Expected Discharge Plan: [place] In-house Referral: Clinical Social Work   Post Acute Care Choice: [place] Living arrangements for the past 2 months: [place]                                      Prior Living Arrangements/Services Living arrangements for the past 2 months: Single Family Home Lives with:: Spouse Patient language and need for interpreter reviewed:: Yes Do you feel safe going back to the place where you live?: Yes      Need for Family Participation in Patient Care: Yes (Comment) Care giver support system in place?: Yes (comment) Current home services: DME Criminal Activity/Legal Involvement Pertinent to Current Situation/Hospitalization: No - Comment as  needed  Activities of Daily Living   ADL Screening (condition at time of admission) Patient's cognitive ability adequate to safely complete daily activities?: Yes Is the patient deaf or have difficulty hearing?: No Does the patient have difficulty seeing, even when wearing glasses/contacts?: No Does the patient have difficulty concentrating, remembering, or making decisions?: No Patient able to express need for assistance with ADLs?: Yes Does the patient have difficulty  dressing or bathing?: Yes Independently performs ADLs?: No Communication: Independent Dressing (OT): Needs assistance Is this a change from baseline?: Pre-admission baseline Grooming: Needs assistance Is this a change from baseline?: Pre-admission baseline Feeding: Needs assistance Is this a change from baseline?: Pre-admission baseline Bathing: Needs assistance Is this a change from baseline?: Pre-admission baseline Toileting: Needs assistance Is this a change from baseline?: Pre-admission baseline In/Out Bed: Needs assistance Is this a change from baseline?: Pre-admission baseline Walks in Home: Dependent Is this a change from baseline?: Pre-admission baseline Does the patient have difficulty walking or climbing stairs?: Yes Weakness of Legs: Left Weakness of Arms/Hands: Left  Permission Sought/Granted Permission sought to share information with : Facility Sport and exercise psychologist, Family Supports Permission granted to share information with : No  Share Information with NAME: Levada Dy  Permission granted to share info w AGENCY: SNFs  Permission granted to share info w Relationship: Spouse  Permission granted to share info w Contact Information: [phone]  Emotional Assessment Appearance:: Appears stated age Attitude/Demeanor/Rapport: Unable to Assess Affect (typically observed): Unable to Assess Orientation: : Oriented to Self, Oriented to Place Alcohol / Substance Use: Not Applicable Psych Involvement: No (comment)  Admission diagnosis:  Weakness [R53.1] Stroke [facility]) [I63.9] Sepsis secondary to UTI ([place]) [A41.9, N39.0] Fever, unspecified fever cause [R50.9] Cerebrovascular accident (CVA), unspecified mechanism ([place]) [I63.9] Patient Active Problem List   Diagnosis Date Noted   Weakness 08/11/2021   Cerebral embolism with cerebral infarction 08/11/2021   Sepsis secondary to UTI ([place]) [id number]   Embolic stroke of right basal ganglia ([place]) 06/08/2021   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[place]) 06/08/2021   Cerebral thrombosis with cerebral infarction 05/27/2021   Numbness and tingling in right hand 09/08/2020   Rhabdomyolysis 08/18/2020   Hypokalemia 08/17/2020   Post herpetic neuralgia 06/30/2020   Left lumbar radiculopathy 06/04/2018   Memory difficulties 03/05/2018   Sialadenitis, right parotid 06/24/2017   Insomnia 05/12/2017   Prediabetes 11/08/2016   Vitamin D deficiency 06/08/2009   Depression 01/07/2009   PEPTIC ULCER DISEASE, CHRONIC 01/07/2009   OTHER OBSTRUCTION OF DUODENUM 01/07/2009   HIATAL HERNIA 01/07/2009   Irritable bowel syndrome 01/07/2009   DEGENERATIVE JOINT DISEASE 01/07/2009   ANEMIA, HX OF 01/07/2009   Right middle lobe pulmonary nodule 06/09/2008   Anxiety 04/01/2008   Essential hypertension 04/01/2008   Allergic rhinitis 04/01/2008   GERD 04/01/2008   LOW BACK PAIN SYNDROME 04/01/2008   Migraines 04/01/2008   PCP:  Binnie Rail, MD Pharmacy:   [place], [place] - [address] [place] [place] [address] Phone: [phone] Fax: [phone]  [id number] [place]- [id number] [id number]: [phone]: [phone]    Social Determinants of Health (SDOH) Interventions    Readmission Risk Interventions No flowsheet data found.

## 2021-08-13 NOTE — Progress Notes (Addendum)
Michaela Rios    Michaela Rios  TJQ:[id number] DOB: 11-14-1951 DOA: 08/01/2021 PCP: Binnie Rail, MD      Brief Narrative:  Michaela Rios is a 70 y.o. F with HTN, CKD IIIa, and PVD as well as recent admission for stroke and FUO presented again with fever and weakness.    History of current illness begins late June (prior to which patient was independent, no dementia, lived with husband, normal with ADLs):  6/27: Initially presented w/ 1 week urinary symptoms, generalized weakness -> temp 101F, HR 140s in the ER, started on treatment for sepsis 6/29: Still encephalopathic out of proportion to expected for sepsis, CT head obtained, showed abnormal signal in BGs -> f/u MRI showed acute right BG stroke, acute left BG/thalamus/caudate T2 hyperintensity, neuro consulted, no focal weakness at that time 6/30: first mention of left sided weakness on exam; LP this date, culture negative, no WBC, Crypto neg, HSV PCR neg, protein elevated and 22 RBCs in tubes 1 and 4, not enough fluid for flow cytometry 7/2: Recurrent fever, ID consulted for FUO  7/2-7/5: Work up for systemic vasculitis included ANCA, complements, ANA, anti-dsDNA, Ro/La Abs, RF.  All negative except ANA+, CRP ~7.  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  The patient's fever resolved with stopping antibiotics.  She was treated with Solu-medrol 1g for 3 days.    7/5: Cerebral angiography was unremarkable.  7/12: She was discharged to CIR, where she progressed to the point of being interactive, ambulatory with a walker.   8/4: Returned home from [place] where she remained ambulatory, interactive for 1-2 weeks, then started to get worse.  9/4: MRI brain w/wo contrast on 9/4 showed evolution of right BG infarct, no change in L BG T2 hyperintensity, and  new acute/subacute infarcts in the left BG.  Around this time, the patient had worsening generalized weakness, worse PO intake, more somnolence and psychomotor slowing, and husband brought her back to the ER.    On return to the ER this admission, she was febrile, tachycardic, but WBC normal, lactate normal, CXR normal.  UA with bacteria, started on broad spectrum antibiotics and admitted.          Assessment & Plan:  Fever of unknown origin Admitted and started on antibiotics.  Urine culture with only 30K Klebsiella, not consistent with infection.  CT abdomen/pelvis last admission showed no stones or masses of the kidneys, nor suspicion for enteroureteral fistula.  Sepsis ruled out, met SIRS criteria but due to pyelo, not clinically septic but started empirically on antibiotics.  Repeat cultures of blood here are negative.  TEE last admission negative.  CT C/A/P last admission negative for lymphadenopathy, showed only a very small low risk 69mm lung nodule, dubious significance.    - Stop antibiotics - Consult ID    Acute metabolic encephalopathy Patient continues to have psychomotor slowing and delayed responses that are excessive and out of proportion to that expected for cystitis or her small BG strokes.  The etiology of this is unclear.  TSH normal, B12 normal.  She is on 2 central acting agents venlafaxine for anxiety and Topamax for migraines. - Reduce Topamax - Taper and stop venlafaxine     Subacute stroke MRI brain one week before admission showed new restricted diffusion consistent with new strokes in left BG.  These appear to be small vessel strokes, which Neurology feel are statistically more likely to be related to risk factors of hypertension, hyperlipidemia.  ILR interrogated, this captured no events, Afib unlikely.  (The tachycardia noted on the report is consistent with her observed sinus tachycardia here)  - Aspirin and Plavix for 3 weeks then Plavix alone -  Continue new atorvastatin - Follow up TCD study and LE dopplers    T2 hyperintensity Unclear cause.  Prior differential included granulomatous disease, CNS vasculitis, or neoplasm, including angiocentric lymphoma.  Given negative WBC in LP, infection is thought very unlikely.  Crypto/HSV negative.  EBV only with IgG, negative IgM.  Discussed with Stroke Neurology and Neurosurgery, who also discussed with general Neurology.  The greatest likelihood is that her imaging findings represent cryptogenic stroke alone.  There are no discrete CNS targets to biopsy, and so this seems unsafe, with low diagnostic yield.  Above work up for systemic and CNS vasculitis has been negative, granted that some of these tests have low sensitivity.  - Stop hydralazine - Outpatient Neurology follow up     Torticollis Low clinical suspicion for meningitis, this is more a left sided neck spasm, present for the last >1 week per husband. MRI brain from 9/4 showed no abnormalities in the upper cervical spine. Also, she is on no dopamine antagonists, I do not suspect a medication related dystonia.  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 - Outpatient Neurology follow up     Sinus tachycardia She remains tachycardic, not eating at all.  I think this is dehydration. - Start IVF  Hypertension BP normal - Continue amlodipine, metoprolol - Stop hydralazine   CKD IIIa  Chronic migraines No headaches - Reduce Topamax  Anxiety - Taper and stop venlafaxine - Reduce bupropion   Hyponatremia Mild asymptomatic  Hypokalemia Supplemented and ersovled  Anemia, normocytic Mild, asymptomatic  Lung nodule 35mm incidental nodule noted last admission.  In this non-smoker, I feel no follow up of this is necessary.        Disposition: Status is: Inpatient  Remains inpatient appropriate because:IV treatments appropriate due to intensity of illness or inability to take PO  Dispo:  The patient is from: Home              Anticipated d/c is to: SNF              Patient currently is not medically stable to d/c.   Difficult to place patient No       Level of care: Med-Surg       MDM: The below labs and imaging reports were reviewed and summarized above.  Medication management as above.    DVT prophylaxis: enoxaparin (LOVENOX) injection 40 mg Start: 08/11/2021 1600  Code Status: FULL Family Communication:      Consultants:   ID Neurology  Procedures:   9/13 CXR clear 9/14 Lower extremity US DVT -- pending 9/14 transcranial doppler with bubbles -- pending  Antimicrobials:    Vancomycin, flagyl x1 on 9/13 Aztreonam 9/13 >> 9/14 Rocephin                       9/15 >> 9/16  Culture data:    9/13 blood culture -- ngtd 9/13 urine culture -- <30K colonies klebnsiella          Subjective: States she "feels better" but doesn't appear better.  Still with left hemiparesis.  Very weak overall.  Fever  to 100.70F last night.  Still with torticollis and left neck spasm/pain.  Still with psychomotor slowing, no change.  No cough or respiratory symptoms, no abdominal pain, no diarrhea, no urinary symptoms.      Objective: Vitals:   08/13/21 0200 08/13/21 0400 08/13/21 0747 08/13/21 1026  BP: (!) 136/95 (!) 135/93 (!) 128/93 (!) 124/96  Pulse: (!) 115 (!) 118 (!) 127   Resp: (!) 22 (!) $Remo'22 20 19  'tGImK$ Temp: 100.1 F (37.8 C) 99 F (37.2 C) 98.7 F (37.1 C) (!) 97.5 F (36.4 C)  TempSrc: Oral Oral Oral Oral  SpO2: 95% 94% 94% 95%  Weight:      Height:        Intake/Output Summary (Last 24 hours) at 08/13/2021 1129 Last data filed at 08/13/2021 1031 Gross per 24 hour  Intake 1490.89 ml  Output 1100 ml  Net 390.89 ml   Filed Weights   08/01/2021 1020 08/11/21 2312  Weight: 55.3 kg 54.5 kg    Examination: General appearance: Adult female, sluggish, lying in bed, opens eyes to touch, responds slowly to questions.  HEENT:  Anicteric,  conjunctiva, lids and lashes normal, no oral lesions, OP moist Skin: Warm and dry.  no jaundice.  No suspicious rashes or lesions. Cardiac: Tachy, regular, no murmurs, no LE edema, no JVD  Respiratory: Normal respiratory rate and rhythm.  CTAB without rales or wheezes. Abdomen: Abdomen soft.  No TTP. No ascites, distension, hepatosplenomegaly.   MSK: No deformities or effusions. Neuro: EOMI, face seems to have left droop, this is hard to evaluate due to torticollis, neck is stiff and tilted to left.  No nystagmus.  4/5 in right arm and leg, 3/5 in left arm and leg, can barely hold up arm against gravity.  Oriented to self, place.  Psychomotor slowing is obvious, some questions she does not answer just stares in space.    Psych: Sensorium intact and responding to questions, psychomotor slowing noted, attention diminished, affect blunted     Data Reviewed: I have personally reviewed following labs and imaging studies:  CBC: Recent Labs  Lab 08/11/2021 1039 08/11/21 0420 08/12/21 0055 08/13/21 0124  WBC 6.4 6.1 8.2 9.2  NEUTROABS 4.9  --   --   --   HGB 12.1 10.5* 11.7* 13.4  HCT 36.5 32.2* 36.9 40.7  MCV 85.3 85.0 85.6 84.6  PLT 402* 313 363 982*   Basic Metabolic Panel: Recent Labs  Lab 08/22/2021 1039 08/11/21 0420 08/11/21 0706 08/12/21 0055 08/13/21 0124  NA 130* 132*  --  135 134*  K 4.0 2.7* 3.0* 3.4* 4.4  CL 100 103  --  106 105  CO2 19* 20*  --  20* 18*  GLUCOSE 103* 101*  --  97 128*  BUN 12 11  --  11 20  CREATININE 0.81 0.77  --  0.63 0.77  CALCIUM 8.5* 8.0*  --  8.6* 8.7*  MG  --   --  1.8  --   --    GFR: Estimated Creatinine Clearance: 56.3 mL/min (by C-G formula based on SCr of 0.77 mg/dL). Liver Function Tests: Recent Labs  Lab 08/03/2021 1039  AST 26  ALT 12  ALKPHOS 66  BILITOT 0.8  PROT 7.0  ALBUMIN 2.9*   No results for input(s): LIPASE, AMYLASE in the last 168 hours. No results for input(s): AMMONIA in the last 168 hours. Coagulation  Profile: Recent Labs  Lab 08/12/2021 1039  INR 1.0   Cardiac Enzymes: No results  for input(s): CKTOTAL, CKMB, CKMBINDEX, TROPONINI in the last 168 hours. BNP (last 3 results) No results for input(s): PROBNP in the last 8760 hours. HbA1C: No results for input(s): HGBA1C in the last 72 hours. CBG: No results for input(s): GLUCAP in the last 168 hours. Lipid Profile: No results for input(s): CHOL, HDL, LDLCALC, TRIG, CHOLHDL, LDLDIRECT in the last 72 hours. Thyroid Function Tests: No results for input(s): TSH, T4TOTAL, FREET4, T3FREE, THYROIDAB in the last 72 hours. Anemia Panel: No results for input(s): VITAMINB12, FOLATE, FERRITIN, TIBC, IRON, RETICCTPCT in the last 72 hours. Urine analysis:    Component Value Date/Time   COLORURINE YELLOW 08/04/2021 1156   APPEARANCEUR CLOUDY (A) 08/18/2021 1156   LABSPEC 1.013 08/26/2021 1156   PHURINE 7.0 07/29/2021 1156   GLUCOSEU NEGATIVE 08/26/2021 1156   GLUCOSEU NEGATIVE 04/07/2021 1450   HGBUR SMALL (A) 08/07/2021 1156   BILIRUBINUR NEGATIVE 08/20/2021 1156   BILIRUBINUR negative 09/29/2020 1445   KETONESUR 5 (A) 08/19/2021 1156   PROTEINUR 30 (A) 08/02/2021 1156   UROBILINOGEN 0.2 04/07/2021 1450   NITRITE NEGATIVE 08/08/2021 1156   LEUKOCYTESUR TRACE (A) 08/04/2021 1156   Sepsis Labs: $RemoveBefo'[email or web address]'[id number]$ (procalcitonin:4,lacticacidven:4)  ) Recent Results (from the past 240 hour(s))  Urine Culture     Status: Abnormal   Collection Time: 07/29/2021 10:27 AM   Specimen: In/Out Cath Urine  Result Value Ref Range Status   Specimen Description IN/OUT CATH URINE  Final   Special Requests   Final    NONE Performed at [facility], [place] [address] [place], [place] [address]    Culture 30,000 COLONIES/mL KLEBSIELLA PNEUMONIAE (A)  Final   Report Status 08/12/2021 FINAL  Final   Organism ID, Bacteria KLEBSIELLA PNEUMONIAE (A)  Final      Susceptibility   Klebsiella pneumoniae - MIC*    AMPICILLIN RESISTANT Resistant     CEFAZOLIN <=4  SENSITIVE Sensitive     CEFEPIME <=0.12 SENSITIVE Sensitive     CEFTRIAXONE <=0.25 SENSITIVE Sensitive     CIPROFLOXACIN <=0.25 SENSITIVE Sensitive     GENTAMICIN <=1 SENSITIVE Sensitive     IMIPENEM <=0.25 SENSITIVE Sensitive     NITROFURANTOIN 64 INTERMEDIATE Intermediate     TRIMETH/SULFA <=20 SENSITIVE Sensitive     AMPICILLIN/SULBACTAM 4 SENSITIVE Sensitive     PIP/TAZO <=4 SENSITIVE Sensitive     * 30,000 COLONIES/mL KLEBSIELLA PNEUMONIAE  Resp Panel by RT-PCR (Flu A&B, Covid) Nasopharyngeal Swab     Status: None   Collection Time: 07/31/2021 10:39 AM   Specimen: Nasopharyngeal Swab; Nasopharyngeal(NP) swabs in vial transport medium  Result Value Ref Range Status   SARS Coronavirus 2 by RT PCR NEGATIVE NEGATIVE Final    Comment: (Rios) SARS-CoV-2 target nucleic acids are NOT DETECTED.  The SARS-CoV-2 RNA is generally detectable in upper respiratory specimens during the acute phase of infection. The lowest concentration of SARS-CoV-2 viral copies this assay can detect is 138 copies/mL. A negative result does not preclude SARS-Cov-2 infection and should not be used as the sole basis for treatment or other patient management decisions. A negative result may occur with  improper specimen collection/handling, submission of specimen other than nasopharyngeal swab, presence of viral mutation(s) within the areas targeted by this assay, and inadequate number of viral copies(<138 copies/mL). A negative result must be combined with clinical observations, patient history, and epidemiological information. The expected result is Negative.  Fact Sheet for Patients:  EntrepreneurPulse.com.au  Fact Sheet for Healthcare Providers:  IncredibleEmployment.be  This test is no t  yet approved or cleared by the [place] and  has been authorized for detection and/or diagnosis of SARS-CoV-2 by FDA under an Emergency Use Authorization (EUA). This EUA will  remain  in effect (meaning this test can be used) for the duration of the COVID-19 declaration under Section 564(b)(1) of the Act, 21 U.S.C.section 360bbb-3(b)(1), unless the authorization is terminated  or revoked sooner.       Influenza A by PCR NEGATIVE NEGATIVE Final   Influenza B by PCR NEGATIVE NEGATIVE Final    Comment: (Rios) The Xpert Xpress SARS-CoV-2/FLU/RSV plus assay is intended as an aid in the diagnosis of influenza from Nasopharyngeal swab specimens and should not be used as a sole basis for treatment. Nasal washings and aspirates are unacceptable for Xpert Xpress SARS-CoV-2/FLU/RSV testing.  Fact Sheet for Patients: EntrepreneurPulse.com.au  Fact Sheet for Healthcare Providers: IncredibleEmployment.be  This test is not yet approved or cleared by the [place] FDA and has been authorized for detection and/or diagnosis of SARS-CoV-2 by FDA under an Emergency Use Authorization (EUA). This EUA will remain in effect (meaning this test can be used) for the duration of the COVID-19 declaration under Section 564(b)(1) of the Act, 21 U.S.C. section 360bbb-3(b)(1), unless the authorization is terminated or revoked.  Performed at [facility], [place] [address], [place], [place] [address]   Blood Culture (routine x 2)     Status: None (Preliminary result)   Collection Time: 08/19/2021 10:39 AM   Specimen: BLOOD RIGHT HAND  Result Value Ref Range Status   Specimen Description BLOOD RIGHT HAND  Final   Special Requests   Final    BOTTLES DRAWN AEROBIC ONLY Blood Culture results may not be optimal due to an inadequate volume of blood received in culture bottles   Culture   Final    NO GROWTH 3 DAYS Performed at [facility], [place] [address], [place], [place] [address]    Report Status PENDING  Incomplete  Blood Culture (routine x 2)     Status: None (Preliminary result)   Collection Time: 08/13/2021 10:39 AM   Specimen:  BLOOD  Result Value Ref Range Status   Specimen Description BLOOD RIGHT ANTECUBITAL  Final   Special Requests   Final    BOTTLES DRAWN AEROBIC AND ANAEROBIC Blood Culture adequate volume   Culture   Final    NO GROWTH 3 DAYS Performed at [facility], [place] [address], [place],  [address]    Report Status PENDING  Incomplete         Radiology Studies: No results found.      Scheduled Meds:  amLODipine  5 mg Oral Daily   aspirin EC  81 mg Oral Daily   atorvastatin  40 mg Oral QHS   buPROPion  300 mg Oral Daily   clonazePAM  0.5 mg Oral QHS   clopidogrel  75 mg Oral Daily   enoxaparin (LOVENOX) injection  40 mg Subcutaneous Q24H   hydrALAZINE  25 mg Oral BID   metoprolol succinate  100 mg Oral Daily   sodium chloride flush  3 mL Intravenous Q12H   topiramate  100 mg Oral BID   venlafaxine XR  150 mg Oral Q breakfast   And   venlafaxine XR  75 mg Oral QHS   cyanocobalamin  1,000 mcg Oral Daily   Continuous Infusions:  cefTRIAXone (ROCEPHIN)  IV 1 g (08/13/21 1021)     LOS: 3 days  Time spent: 35 minutes    Edwin Dada, MD [facility] 08/13/2021, 11:29 AM     Please page though [place] or Epic secure chat:  For [employer], Adult nurse

## 2021-08-13 NOTE — Progress Notes (Signed)
STROKE TEAM PROGRESS NOTE   INTERVAL HISTORY Patient is lying in bed comfortably.  She states she is feeling better.  Her husband is at the bedside.  Neurological exam is unchanged.  Vital signs are stable.  She continues to have low-grade fever.  Loop recorder interrogation does not show any paroxysmal A. fib.  Transcranial Doppler bubble study was performed at the bedside and is positive for a large right-to-left shunt with curtain effect Lower extremity venous Dopplers are negative for DVT  Vitals:   08/13/21 1158 08/13/21 1200 08/13/21 1400 08/13/21 1606  BP: (!) 121/92 (!) 123/92 (!) 128/93 125/90  Pulse: (!) 125 (!) 125 (!) 116 (!) 111  Resp: (!) '21  20 20  '$ Temp: 99.1 F (37.3 C)  98.6 F (37 C) 98.8 F (37.1 C)  TempSrc:   Axillary Oral  SpO2: 95% 95% 94% 95%  Weight:      Height:       CBC:  Recent Labs  Lab 08/09/2021 1039 08/11/21 0420 08/12/21 0055 08/13/21 0124  WBC 6.4   < > 8.2 9.2  NEUTROABS 4.9  --   --   --   HGB 12.1   < > 11.7* 13.4  HCT 36.5   < > 36.9 40.7  MCV 85.3   < > 85.6 84.6  PLT 402*   < > 363 442*   < > = values in this interval not displayed.   Basic Metabolic Panel:  Recent Labs  Lab 08/11/21 0706 08/12/21 0055 08/13/21 0124  NA  --  135 134*  K 3.0* 3.4* 4.4  CL  --  106 105  CO2  --  20* 18*  GLUCOSE  --  97 128*  BUN  --  11 20  CREATININE  --  0.63 0.77  CALCIUM  --  8.6* 8.7*  MG 1.8  --   --     Lipid Panel: Results for Michaela Rios, Michaela Rios (MRN [id number]) as of 08/11/2021 15:24  Ref. Range 05/27/2021 01:06  Total CHOL/HDL Ratio Latest Units: RATIO 4.0  Cholesterol Latest Ref Range: 0 - 200 mg/dL 96  HDL Cholesterol Latest Ref Range: >40 mg/dL 24 (L)  LDL (calc) Latest Ref Range: 0 - 99 mg/dL 45  Triglycerides Latest Ref Range: <150 mg/dL 136  VLDL Latest Ref Range: 0 - 40 mg/dL 27   HgbA1c:  Results for Michaela Rios, Michaela Rios (MRN [id number]) as of 08/11/2021 15:24  Ref. 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  She is awake, alert, and oreinted. But she speaks slowly and softly, but speech is able to  be understood. No signs of aphasia or neglect. Visual Fields are full. Pupils are equal, round, and reactive to light. EOMI without ptosis or diploplia.  Facial sensation is symmetric to temperature Facial movement is symmetric.  Hearing is intact to voice. Uvula midline and palate elevates symmetrically. Shoulder shrug is symmetric. Tongue is midline without atrophy or fasciculations.   4-/5 strength right upper and lower, ~4-/5 left upper 3+/5 left lower. Sensation is symmetric to temperature in the arms and legs. Left toe is downgoing   ASSESSMENT/PLAN Michaela Rios is a 70 y.o. female with history of recent bihemispheric strokes, chronic migraine, hypertension,  presenting with  generalized weakness and spastic left sided hemiparesis. .   Stroke:  left caudate and putamen infarct embolic secondary to likely small vessel disease .  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 Continued expected evolution of deep gray matter infarcts bilaterally. 2. Additional punctate areas of diffusion hyperintensity are present within the left caudate head and posterior left putamen. These likely represent areas of acute/subacute ischemia. 3. Stable diffuse T2 signal change in the basal ganglia bilaterally and left greater than right thalami. This likely reflects the sequela of chronic microvascular ischemia.   CTA HEAD AND NECK 05/26/2021  1. Minimal atherosclerosis in the head and neck without large vessel occlusion or significant stenosis. 2. Aortic Atherosclerosis (ICD10-I70.0).  TEE 06/03/2021: EF  60-65%, no left atrial appendage thrombus detected, Agitated saline contrast bubble study was positive with shunting  observed within 3-6 cardiac cycles suggestive of interatrial shunt.  Significant shunting seen, suggestive of large patent foramen ovale.     ILR placement 06/08/2021. Needs to be interrogated.   Doppler BLE 05/25/2021 - No evidence of deep vein thrombosis seen in the lower extremities,  bilaterally.  -No evidence of popliteal cyst, bilaterally.  Repeat during this hospitalization - ordered     LDL 45 HgbA1c 5.9 TCD bubble study positive for large right-to-left shunt Lower extremity venous Dopplers no evidence of DVT VTE prophylaxis - scd    Diet   DIET DYS 2 Room service appropriate? Yes with Assist; Fluid consistency: Thin   aspirin 81 mg daily prior to admission, now on aspirin 81 mg daily.   Therapy recommendations:  CLR Disposition:  pending   Hypertension Home meds:  amlodipine, hydralazine, metoprolol Stable Permissive hypertension (OK if < 220/120) but gradually normalize in 5-7 days Long-term BP goal normotensive  LDL 45, goal < 70  HgbA1c 5.9, goal < 7.0  Other Stroke Risk Factors  Advanced Age >/= 71    Hx stroke/TIA  PFO    Other Active Problems    Hospital day # 3   Patient with recent extensive work-up for bilateral multiple strokes in July 22 negative for vasculitis and cryptogenic stroke who has a loop recorder but without showing any evidence of paroxysmal A. fib so far..  She presented with fever of unknown origin and altered mental status and MRI shows small bilateral subcortical infarcts .  Recommend aspirin Plavix for 3 weeks followed by Plavix alone.  Transcranial Doppler study suggests a large PFO but given her low ROPE score of 2 this is less likely a PFO attributable stroke.  May consider further discussion with patient and husband about PFO closure electively as an outpatient provided she makes significant clinical improvement.   Current indication for endovascular PFO closure is for patients less than 77 years of age with cryptogenic stroke.  Fever work-up as per primary team and ID team.  Long discussion with patient, her husband and Dr. Loleta Books and Dr. vu and answered questions.  Greater than 50% time during this 25-minute visit was spent in counseling and coordination of care and discussion with care team and answering questions.I called and left message for pt`s husband to call me but he did not return my call. Stroke team will sign off.  Kindly call for questions.  Follow-up as an outpatient in the stroke clinic in 2 months Antony Contras, MD Medical Director [facility] Pager: [phone]  08/13/2021 4:31 PM     To contact Stroke Continuity provider, please refer to [email or web address] After hours, contact General Neurology

## 2021-08-13 NOTE — Progress Notes (Signed)
Date and time results received: 08/13/21 1740 (use smartphrase ".now" to insert current time)  Test: Parasite  Critical Value: NEGATIVE preliminary  Name of Provider Notified: Dr. Loleta Books  Orders Received? Or Actions Taken?:  NO

## 2021-08-13 NOTE — Care Management (Signed)
Michaela Rios, Michaela Rios [id number] (CSN: [id number]) (70 y.o. F) (Adm: 08/16/2021) [id number] PCP  BURNS, Claudina Lick Demographics Comment     Address  [place] [address]   Home Phone  [phone]   Work Phone     Mobile Phone  [phone]         Social Security Number  [id number]   Insurance Information  [employer] MEDICARE   Marital Status  Married   [place]  Date Of Birth  09-Jul-1951 Gender Identity  Female Race  White or Caucasian Ethnic Group  Not Hispanic or Latino Preferred Language  English   Documents Filed to Patient  Power of Attorney Living Will Clinical Unknown Study Attachment Consent Form ABN Waiver After Visit Summary Lab Result Scan Code Status MyChart Status Advance Care Planning   Not on File  Not on File  Not on File  Not on File  Filed  Not on File  Jani Files  FULL [Updated on 08/02/2021 1346]  Active Jump to the Activity    Admission Information  Current Information  Attending Provider Admitting Provider Admission Type Admission Status  Danford, Suann Larry, MD Edwin Dada, MD Emergency Confirmed Admission        Admission Date/Time Discharge Date Hospital Service Auth/Cert Status  [id number]  10:16 AM  Acute Care Incomplete        Hospital Area Unit Room/Bed   [place] [facility] Account  Name Acct ID Class Status Primary Coverage  [name], [name] [id number] Inpatient Open [place]        Guarantor Account (for Hospital Account [id number])  Name Relation to Pt Service Area Active? Acct Type  [name] Self [facility] Yes Personal/Family  Address Phone    [place]  [place], [place] [address] [phone])          Coverage Information (for Hospital Account [id number])  F/O Payor/Plan Precert #  [facility] MEDICARE HMO/PPO    Subscriber Subscriber #  [name], [name] [id number]  Address [place]  [address] [address] [phone]         Care Everywhere ID:  [id number]

## 2021-08-13 NOTE — Progress Notes (Signed)
   08/13/21 1947  Assess: MEWS Score  Temp 99.9 F (37.7 C)  BP 131/86  Resp (!) 24  SpO2 96 %  O2 Device Room Air  Assess: MEWS Score  MEWS Temp 0  MEWS Systolic 0  MEWS Pulse 1  MEWS RR 1  MEWS LOC 0  MEWS Score 2  MEWS Score Color Yellow  Assess: if the MEWS score is Yellow or Red  Were vital signs taken at a resting state? Yes  Focused Assessment Change from prior assessment (see assessment flowsheet)  Early Detection of Sepsis Score *See Row Information* Low  MEWS guidelines implemented *See Row Information* Yes  Treat  MEWS Interventions Escalated (See documentation below)  Pain Scale 0-10  Pain Score 0  Take Vital Signs  Increase Vital Sign Frequency  Yellow: Q 2hr X 2 then Q 4hr X 2, if remains yellow, continue Q 4hrs  Escalate  MEWS: Escalate Yellow: discuss with charge nurse/RN and consider discussing with provider and RRT  Notify: Charge Nurse/RN  Name of Charge Nurse/RN Notified Chris, RN  Date Charge Nurse/RN Notified 08/13/21  Time Charge Nurse/RN Notified 1949  Notify: Provider  Provider Name/Title D. [place]  Date Provider Notified 08/13/21  Time Provider Notified 2033  Notification Type Page  Notification Reason Change in status  Provider response Evaluate remotely  Date of Provider Response 08/13/21  Time of Provider Response 2042  Document  Patient Outcome Other (Comment) (continuing yellow MEWS interventions; on call MD aware; awaiting f/u communication)

## 2021-08-13 NOTE — Progress Notes (Signed)
   08/13/21 0002  Assess: MEWS Score  Temp (!) 100.4 F (38 C)  BP 134/89  Pulse Rate (!) 109  Resp (!) 24  Level of Consciousness Alert  SpO2 93 %  O2 Device Room Air  Assess: MEWS Score  MEWS Temp 0  MEWS Systolic 0  MEWS Pulse 1  MEWS RR 1  MEWS LOC 0  MEWS Score 2  MEWS Score Color Yellow  Assess: if the MEWS score is Yellow or Red  Were vital signs taken at a resting state? Yes  Focused Assessment No change from prior assessment  Early Detection of Sepsis Score *See Row Information* Low  MEWS guidelines implemented *See Row Information* Yes  Treat  MEWS Interventions Escalated (See documentation below)  Pain Scale 0-10  Pain Score 0  Take Vital Signs  Increase Vital Sign Frequency  Yellow: Q 2hr X 2 then Q 4hr X 2, if remains yellow, continue Q 4hrs  Escalate  MEWS: Escalate Yellow: discuss with charge nurse/RN and consider discussing with provider and RRT  Notify: Charge Nurse/RN  Name of Charge Nurse/RN Notified Nicole, RN  Date Charge Nurse/RN Notified 08/13/21  Time Charge Nurse/RN Notified 0008

## 2021-08-13 NOTE — Progress Notes (Signed)
Physical Therapy Treatment Patient Details Name: Michaela Rios MRN: [id number] DOB: August 28, 1951 Today's Date: 08/13/2021   History of Present Illness Pt is a 70yo female presenting to [facility] ED on 9/13 with weakness, poor PO inntake, found to be febrile in ED. Workup for sepsis, unclear etiology. Pt with admission July of 2022 for CVA. Per neuro notes, acute/subacute left posterior putamen and caudate head on MRI on 9/4. 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    PT Comments    Pt received in bed, lethargic with minimal interaction. She required total assist bed mobility, and max assist to maintain balance EOB x 15 minutes. Cues required to keep eyes open. Little to no active movement noted BUE/LE. RN administered pt's meds in applesauce while pt sat EOB. At end of session, pt supine in bed with HOB at 30 degrees.     Recommendations for follow up therapy are one component of a multi-disciplinary discharge planning process, led by the attending physician.  Recommendations may be updated based on patient status, additional functional criteria and insurance authorization.  Follow Up Recommendations  CIR;Supervision/Assistance - 24 hour     Equipment Recommendations  3in1 (PT);Wheelchair cushion (measurements PT);Wheelchair (measurements PT)    Recommendations for Other Services Rehab consult     Precautions / Restrictions Precautions Precautions: Fall     Mobility  Bed Mobility Overal bed mobility: Needs Assistance Bed Mobility: Rolling;Supine to Sit;Sit to Supine Rolling: Total assist   Supine to sit: Total assist Sit to supine: +2 for physical assistance;Total assist   General bed mobility comments: assist for all aspects of mobility. Little to no effort from pt.    Transfers                 General transfer comment: unable to safely progress beyond EOB  Ambulation/Gait                 Stairs             Wheelchair Mobility     Modified Rankin (Stroke Patients Only) Modified Rankin (Stroke Patients Only) Pre-Morbid Rankin Score: Moderately severe disability Modified Rankin: Severe disability     Balance Overall balance assessment: Needs assistance Sitting-balance support: No upper extremity supported;Feet supported Sitting balance-Leahy Scale: Poor Sitting balance - Comments: Therapist providing max support with trunk to trunk contact on pt's L side, with pillow as buffer/padding between. Pt sat EOB x 15 minutes while RN administered meds in applesauce.                                    Cognition Arousal/Alertness: Lethargic Behavior During Therapy: Flat affect Overall Cognitive Status: No family/caregiver present to determine baseline cognitive functioning                                 General Comments: Cues to keep eyes open. Minimal verbalizations. Poor command follow requiring increased time and effort.      Exercises      General Comments        Pertinent Vitals/Pain Pain Assessment: Faces Faces Pain Scale: Hurts little more Pain Location: neck with ROM Pain Descriptors / Indicators: Grimacing;Guarding Pain Intervention(s): Monitored during session;Repositioned;Limited activity within patient's tolerance    Home Living  Prior Function            PT Goals (current goals can now be found in the care plan section) Acute Rehab PT Goals Patient Stated Goal: not stated Progress towards PT goals: Not progressing toward goals - comment (increased lethargy)    Frequency    Min 4X/week      PT Plan Current plan remains appropriate    Co-evaluation              AM-PAC PT "6 Clicks" Mobility   Outcome Measure  Help needed turning from your back to your side while in a flat bed without using bedrails?: A Lot Help needed moving from lying on your back to sitting on the side of a flat bed without using bedrails?:  Total Help needed moving to and from a bed to a chair (including a wheelchair)?: Total Help needed standing up from a chair using your arms (e.g., wheelchair or bedside chair)?: Total Help needed to walk in hospital room?: Total Help needed climbing 3-5 steps with a railing? : Total 6 Click Score: 7    End of Session   Activity Tolerance: Patient limited by fatigue Patient left: in bed;with call bell/phone within reach;with bed alarm set Nurse Communication: Mobility status PT Visit Diagnosis: Other symptoms and signs involving the nervous system (R29.898);Muscle weakness (generalized) (M62.81);Unsteadiness on feet (R26.81)     Time: 1000-1024 PT Time Calculation (min) (ACUTE ONLY): 24 min  Charges:  $Therapeutic Activity: 23-37 mins                     [name], PT  Office # [phone] Pager [phone]    Michaela Rios 08/13/2021, 12:36 PM

## 2021-08-14 ENCOUNTER — Inpatient Hospital Stay (HOSPITAL_COMMUNITY): Payer: Medicare HMO

## 2021-08-14 DIAGNOSIS — R509 Fever, unspecified: Secondary | ICD-10-CM | POA: Diagnosis not present

## 2021-08-14 DIAGNOSIS — I639 Cerebral infarction, unspecified: Secondary | ICD-10-CM | POA: Diagnosis not present

## 2021-08-14 LAB — CBC
HCT: 35.2 % — ABNORMAL LOW (ref 36.0–46.0)
Hemoglobin: 11.5 g/dL — ABNORMAL LOW (ref 12.0–15.0)
MCH: 27.6 pg (ref 26.0–34.0)
MCHC: 32.7 g/dL (ref 30.0–36.0)
MCV: 84.6 fL (ref 80.0–100.0)
Platelets: 381 10*3/uL (ref 150–400)
RBC: 4.16 MIL/uL (ref 3.87–5.11)
RDW: 16.1 % — ABNORMAL HIGH (ref 11.5–15.5)
WBC: 10.3 10*3/uL (ref 4.0–10.5)
nRBC: 0 % (ref 0.0–0.2)

## 2021-08-14 LAB — URINALYSIS, MICROSCOPIC (REFLEX)
Bacteria, UA: NONE SEEN
WBC, UA: >50 WBC/hpf (ref 0–5)

## 2021-08-14 LAB — URINALYSIS, ROUTINE W REFLEX MICROSCOPIC
Bilirubin Urine: NEGATIVE
Glucose, UA: NEGATIVE mg/dL
Ketones, ur: NEGATIVE mg/dL
Nitrite: POSITIVE — AB
Protein, ur: NEGATIVE mg/dL
Specific Gravity, Urine: 1.025 (ref 1.005–1.030)
pH: 6 (ref 5.0–8.0)

## 2021-08-14 LAB — ENA+DNA/DS+ANTICH+CENTRO+JO...
Anti JO-1: <0.2 AI (ref 0.0–0.9)
Centromere Ab Screen: <0.2 AI (ref 0.0–0.9)
Chromatin Ab SerPl-aCnc: 0.5 AI (ref 0.0–0.9)
ENA SM Ab Ser-aCnc: 1.6 AI — ABNORMAL HIGH (ref 0.0–0.9)
Ribonucleic Protein: 0.3 AI (ref 0.0–0.9)
SSA (Ro) (ENA) Antibody, IgG: <0.2 AI (ref 0.0–0.9)
SSB (La) (ENA) Antibody, IgG: <0.2 AI (ref 0.0–0.9)
Scleroderma (Scl-70) (ENA) Antibody, IgG: <0.2 AI (ref 0.0–0.9)
ds DNA Ab: 6 IU/mL (ref 0–9)

## 2021-08-14 LAB — BASIC METABOLIC PANEL
Anion gap: 10 (ref 5–15)
BUN: 32 mg/dL — ABNORMAL HIGH (ref 8–23)
CO2: 18 mmol/L — ABNORMAL LOW (ref 22–32)
Calcium: 8.6 mg/dL — ABNORMAL LOW (ref 8.9–10.3)
Chloride: 107 mmol/L (ref 98–111)
Creatinine, Ser: 0.91 mg/dL (ref 0.44–1.00)
GFR, Estimated: >60 mL/min (ref >60)
Glucose, Bld: 114 mg/dL — ABNORMAL HIGH (ref 70–99)
Potassium: 3.9 mmol/L (ref 3.5–5.1)
Sodium: 135 mmol/L (ref 135–145)

## 2021-08-14 LAB — PROTIME-INR
INR: 1.8 — ABNORMAL HIGH (ref 0.8–1.2)
Prothrombin Time: 20.4 seconds — ABNORMAL HIGH (ref 11.4–15.2)

## 2021-08-14 LAB — C-REACTIVE PROTEIN: CRP: 8.6 mg/dL — ABNORMAL HIGH (ref <1.0)

## 2021-08-14 LAB — LACTATE DEHYDROGENASE: LDH: 114 U/L (ref 98–192)

## 2021-08-14 LAB — RHEUMATOID FACTOR: Rheumatoid fact SerPl-aCnc: 12.2 IU/mL (ref <14.0)

## 2021-08-14 LAB — ANA W/REFLEX IF POSITIVE: Anti Nuclear Antibody (ANA): POSITIVE — AB

## 2021-08-14 LAB — SEDIMENTATION RATE: Sed Rate: 34 mm/hr — ABNORMAL HIGH (ref 0–22)

## 2021-08-14 LAB — APTT: aPTT: 47 seconds — ABNORMAL HIGH (ref 24–36)

## 2021-08-14 LAB — PROCALCITONIN: Procalcitonin: 0.21 ng/mL

## 2021-08-14 LAB — FIBRINOGEN: Fibrinogen: 481 mg/dL — ABNORMAL HIGH (ref 210–475)

## 2021-08-14 LAB — GLUCOSE, CAPILLARY: Glucose-Capillary: 96 mg/dL (ref 70–99)

## 2021-08-14 IMAGING — DX DG CHEST 1V PORT
1 series · 1 of 1 positions shown · non-contrast
Comparison: [DATE]

CLINICAL DATA: Shortness of breath

EXAM:
PORTABLE CHEST 1 VIEW

[chest ap]
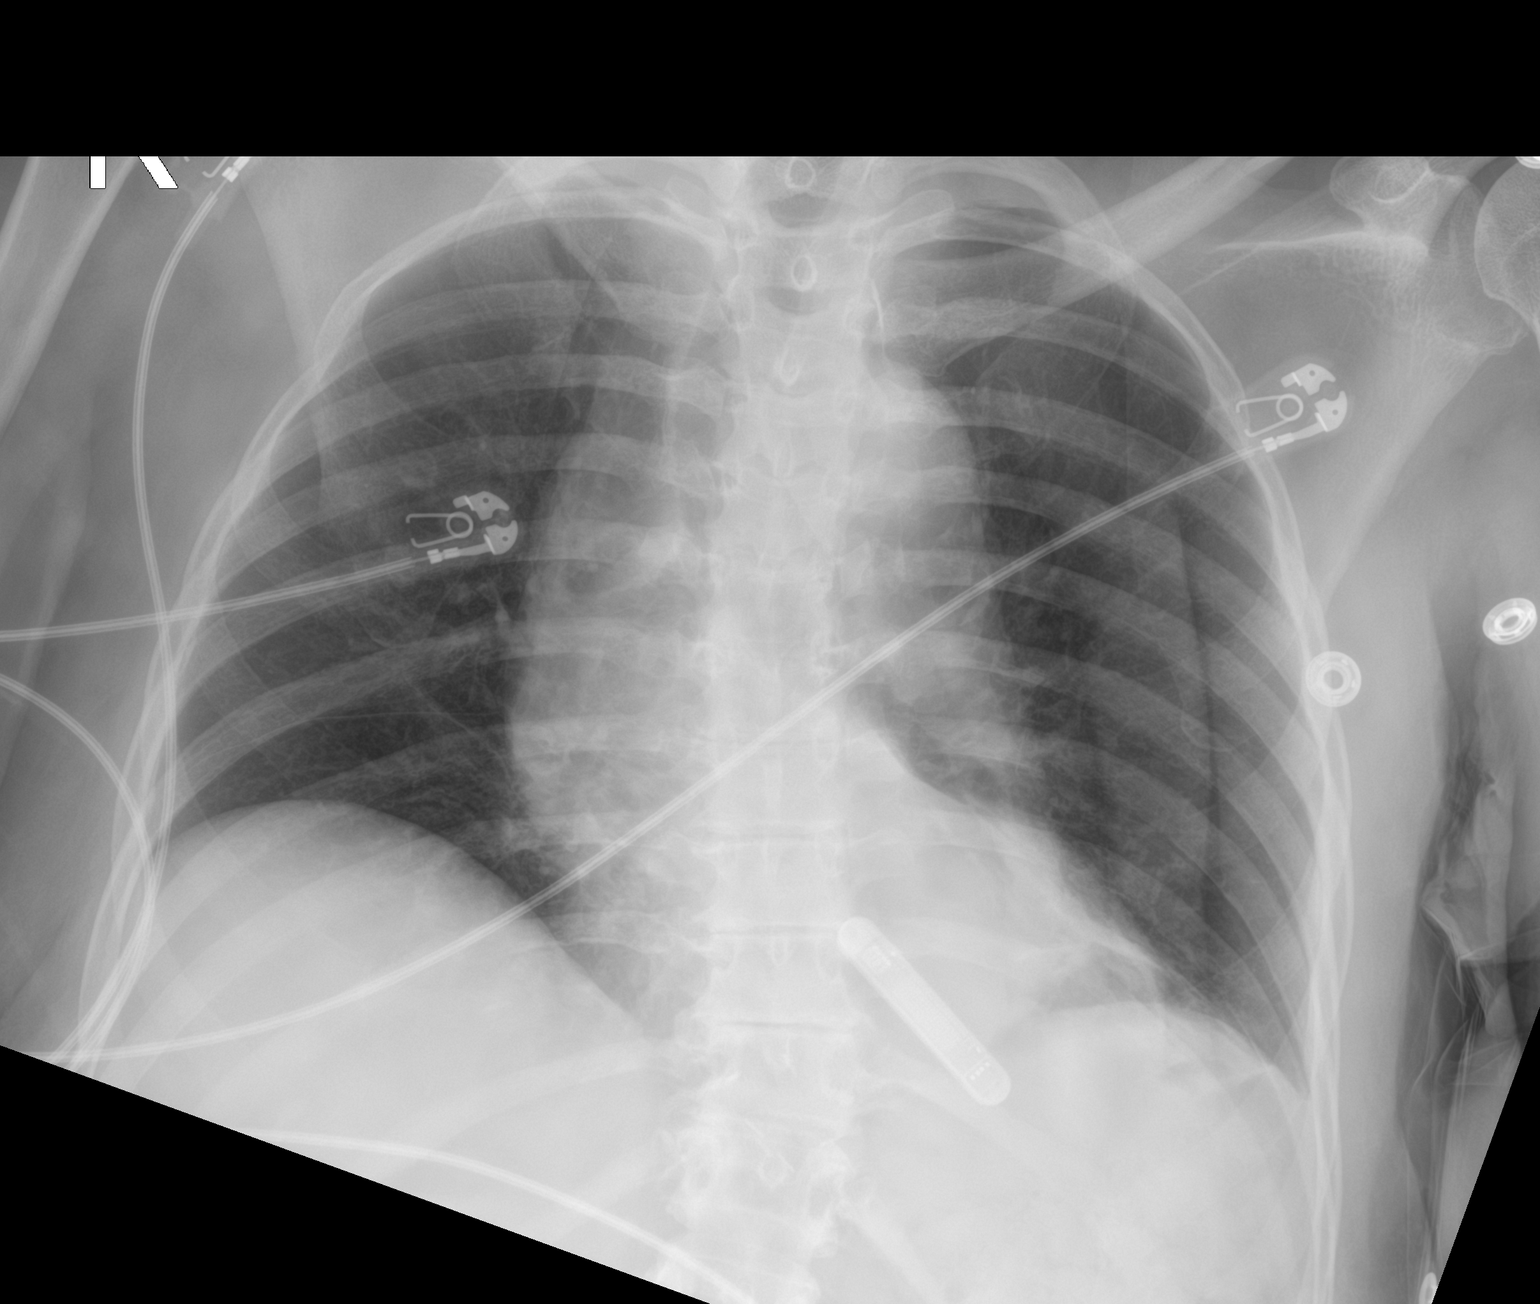

[1 of 1 positions shown; findings below may reference images not displayed]

FINDINGS: Hazy and streaky retrocardiac opacity. Aortic tortuosity. Normal
heart size. Implantable loop recorder. The lungs are clear of edema.
No pleural fluid or pneumothorax.
IMPRESSION: Retrocardiac opacity with shape favoring atelectasis.

## 2021-08-14 MED ORDER — LACTATED RINGERS IV SOLN: INTRAVENOUS | Status: DC

## 2021-08-14 MED ORDER — METRONIDAZOLE 500 MG/100ML IV SOLN
500.0000 mg | Freq: Two times a day (BID) | INTRAVENOUS | Status: DC
  Administered 2021-08-14 – 2021-08-16 (×4): 500 mg via INTRAVENOUS
  Filled 2021-08-14 (×4): qty 100

## 2021-08-14 MED ORDER — SODIUM CHLORIDE 0.9 % IV SOLN
1.0000 g | INTRAVENOUS | Status: DC
  Administered 2021-08-14 – 2021-08-15 (×2): 1 g via INTRAVENOUS
  Filled 2021-08-14 (×2): qty 10

## 2021-08-14 NOTE — Progress Notes (Addendum)
Pharmacy Antibiotic Note  Michaela Rios is a 70 y.o. female admitted on 07/29/2021 with  FUO  with recent admission of extensive work-up.  Pharmacy has been consulted for antibiotic dosing for aspiration pneumonia.  PCN and amoxicillin allergy noted, Tolerated Ceftriaxone in the past.   Plan: Start Ceftriaxone 1 g q 24h and flagyl 500 mg q 12h  F/u clinical improvement, cultures, and LOT  Pharmacy to sign off, will continue to follow peripherally   Height: '5\' 6"'$  (167.6 cm) Weight: 55.1 kg (121 lb 7.6 oz) IBW/kg (Calculated) : 59.3  Temp (24hrs), Avg:99.1 F (37.3 C), Min:97.9 F (36.6 C), Max:100.1 F (37.8 C)  Recent Labs  Lab 08/09/2021 1039 08/11/21 0420 08/12/21 0055 08/13/21 0124 08/14/21 0138 08/14/21 0716  WBC 6.4 6.1 8.2 9.2  --  10.3  CREATININE 0.81 0.77 0.63 0.77 0.91  --   LATICACIDVEN 1.7  --   --   --   --   --     Estimated Creatinine Clearance: 50 mL/min (by C-G formula based on SCr of 0.91 mg/dL).    Allergies  Allergen Reactions   Amitriptyline Hcl     Changed personality   Shellfish Allergy Nausea And Vomiting and Other (See Comments)    Tested "high" on allergy test   Lisinopril Cough    .   Amoxicillin Rash   Cephalexin Rash   Penicillins Rash    Antimicrobials this admission: Vanc 9/13 >> 9/14 Aztreonam 9/13 >> 9/14 Flagyl x1 in ED 9/15 CTX >> (9/17)  Microbiology results: 9/13 BCx: NGTD 9/13 UCx: kleb pneumo (R ampicillin)   Thank you for allowing pharmacy to be a part of this patient's care.  Levonne Spiller 08/14/2021 12:49 PM

## 2021-08-14 NOTE — Progress Notes (Signed)
[facility] PROGRESS NOTE    Michaela Rios  [id number] DOB: 09-Nov-1951 DOA: 08/02/2021 PCP: Binnie Rail, MD      Brief Narrative:  Michaela Rios is a 70 y.o. F with HTN, CKD IIIa, and PVD as well as recent admission for stroke and FUO presented again with fever and weakness.    History of current illness begins late June (prior to which patient was independent, no dementia, lived with husband, normal with ADLs):  6/27: Initially presented w/ 1 week urinary symptoms, generalized weakness -> temp 101F, HR 140s in the ER, started on treatment for sepsis 6/29: Still encephalopathic out of proportion to expected for sepsis, CT head obtained, showed abnormal signal in BGs -> f/u MRI showed acute right BG stroke, acute left BG/thalamus/caudate T2 hyperintensity, neuro consulted, no focal weakness at that time 6/30: first mention of left sided weakness on exam; LP this date, culture negative, no WBC, Crypto neg, HSV PCR neg, protein elevated and 22 RBCs in tubes 1 and 4, not enough fluid for flow cytometry 7/2: Recurrent fever, ID consulted for FUO  7/2-7/5: Work up for systemic vasculitis included ANCA, complements, ANA, anti-dsDNA, Ro/La Abs, RF.  All negative except ANA+, CRP ~7.  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  The patient's fever resolved with stopping antibiotics.  She was treated with Solu-medrol 1g for 3 days.    7/5: Cerebral angiography was unremarkable.  7/12: She was discharged to CIR, where she progressed to the point of being interactive, ambulatory with a walker.   8/4: Returned home from [place] where she remained ambulatory, interactive for 1-2 weeks, then started to get worse.  9/4: MRI brain w/wo contrast on 9/4 showed evolution of right BG infarct, no change in L BG T2 hyperintensity, and  new acute/subacute infarcts in the left BG.  Around this time, the patient had worsening generalized weakness, worse PO intake, more somnolence and psychomotor slowing, and husband brought her back to the ER.    On return to the ER this admission, she was febrile, tachycardic, but WBC normal, lactate normal, CXR normal.  UA with bacteria, started on broad spectrum antibiotics and admitted.    Subjective:  Patient in bed appears to be in no discomfort, head restricted towards the left, denies any headache or chest pain, states she is comfortable.  No cough or belly pain.   Assessment & Plan:  Fever - off and on could be neurological but LLL Infilterate on 08/14/21, high risk for aspiration, repeat BC, UA-UC, Procal and WBC trend, Unasyn for now.  Acute metabolic encephalopathy - Could be due to CVA + PNA, TSH normal, B12 normal.  She was on 2 central acting agents venlafaxine for anxiety and Topamax for migraines, Reduced Topamax, Taper and stop venlafaxine.  Subacute strokes - Left caudate and putamen infarct embolic secondary to likely small vessel disease .  Prior history of multiple cryptogenic strokes in July 2022 with negative extensive work-up she does have a PFO but at her age she has a low rope score of 2 making PFO unlikely cause of her strokes  Seen by CVA team, ILR interrogated, this captured no events, Afib unlikely.  (The tachycardia noted on the report is consistent with her observed sinus tachycardia here), per CVA team -  Aspirin and Plavix for 3 weeks then Plavix alone, Continue  new atorvastatin, TCD confirms PFO, Leg [place] -ve for DVT.  Torticollis - Low clinical suspicion for meningitis, this is more a left sided neck spasm, present for the last >1 week per husband. MRI brain from 9/4 showed no abnormalities in the upper cervical spine. Also, she is on no dopamine antagonists, I do not suspect a medication related dystonia.  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   Sinus tachycardia  She remains tachycardic, not eating at all.  Start IVF, stable TSH.  Hypertension  - BP normal, Continue amlodipine, metoprolol    CKD IIIa - stable.  Chronic migraines - No headaches,  reduced Topamax  Anxiety - Taper and stop venlafaxine, Reduced bupropion   Hyponatremia Mild asymptomatic  Hypokalemia Supplemented and ersovled  Anemia, normocytic Mild, asymptomatic  Lung nodule 46m incidental nodule noted last admission.  Outpt Pulm follow up.  PFO.  Cardiology outpatient cardiology follow-up for elective closure if family desires.    Disposition: Status is: Inpatient  Remains inpatient appropriate because:IV treatments appropriate due to intensity of illness or inability to take PO  Dispo: The patient is from: Home              Anticipated d/c is to: SNF              Patient currently is not medically stable to d/c.   Difficult to place patient No  Level of care: Tele   MDM: The below labs and imaging reports were reviewed and summarized above.  Medication management as above.    DVT prophylaxis: enoxaparin (LOVENOX) injection 40 mg Start: 08/11/2021 1600  Code Status: FULL Family Communication:      Consultants:   ID Neurology  Procedures:   9/13 CXR clear 9/14 Lower extremity [place] -- no DVT 9/14 transcranial doppler with bubbles -- TCD bubble study positive for large right-to-left shunt +ve grade 5 PFO - DW Dr XErlinda Hong9/17/22 CT head 9/17 - non acute  Antimicrobials:    Vancomycin, flagyl x1 on 9/13 Aztreonam 9/13 >> 9/14 Rocephin    9/15 >> 9/16 Unasyn 9/17  Culture data:    9/13 blood culture -- ngtd 9/13 urine culture -- <30K colonies klebnsiella   Objective: Vitals:   08/13/21 2200 08/14/21 0007 08/14/21 0400 08/14/21 0800  BP: (!) 133/93 129/85 111/73 118/70  Pulse: (!) 102 100 (!) 101 (!) 108  Resp: (!) 22 17 (!) 24 19  Temp: 99.6 F  (37.6 C) 99.1 F (37.3 C) 98.5 F (36.9 C) 100.1 F (37.8 C)  TempSrc: Axillary Axillary Axillary Oral  SpO2: 94% 96% 94% 94%  Weight:   55.1 kg   Height:        Intake/Output Summary (Last 24 hours) at 08/14/2021 1210 Last data filed at 08/14/2021 1115 Gross per 24 hour  Intake 1028.36 ml  Output 250 ml  Net 778.36 ml   Filed Weights   08/09/2021 1020 08/11/21 2312 08/14/21 0400  Weight: 55.3 kg 54.5 kg 55.1 kg    Examination:  Awake Alert, neck is tilted to left, strength 4/5 in all extremities except for the left leg which is 3/5. [place].AT,PERRAL Supple Neck,No JVD, No cervical lymphadenopathy appriciated.  Symmetrical Chest wall movement, Good air movement bilaterally, CTAB RRR,No Gallops, Rubs or new Murmurs, No Parasternal Heave +ve B.Sounds, Abd Soft, No tenderness, No organomegaly appriciated, No rebound - guarding or rigidity. No Cyanosis, Clubbing or edema, No new  Rash or bruise    Data Reviewed: I have personally reviewed following labs and imaging studies:  Recent Labs  Lab 08/27/2021 1039 08/11/21 0420 08/12/21 0055 08/13/21 0124 08/14/21 0716  WBC 6.4 6.1 8.2 9.2 10.3  HGB 12.1 10.5* 11.7* 13.4 11.5*  HCT 36.5 32.2* 36.9 40.7 35.2*  PLT 402* 313 363 442* 381  MCV 85.3 85.0 85.6 84.6 84.6  MCH 28.3 27.7 27.1 27.9 27.6  MCHC 33.2 32.6 31.7 32.9 32.7  RDW 15.5 15.6* 15.7* 15.9* 16.1*  LYMPHSABS 0.9  --   --   --   --   MONOABS 0.5  --   --   --   --   EOSABS 0.2  --   --   --   --   BASOSABS 0.0  --   --   --   --     Recent Labs  Lab 08/12/2021 1039 08/11/21 0420 08/11/21 0706 08/12/21 0055 08/13/21 0124 08/14/21 0138 08/14/21 0716  NA 130* 132*  --  135 134* 135  --   K 4.0 2.7* 3.0* 3.4* 4.4 3.9  --   CL 100 103  --  106 105 107  --   CO2 19* 20*  --  20* 18* 18*  --   GLUCOSE 103* 101*  --  97 128* 114*  --   BUN 12 11  --  11 20 32*  --   CREATININE 0.81 0.77  --  0.63 0.77 0.91  --   CALCIUM 8.5* 8.0*  --  8.6* 8.7* 8.6*  --   AST 26   --   --   --   --   --   --   ALT 12  --   --   --   --   --   --   ALKPHOS 66  --   --   --   --   --   --   BILITOT 0.8  --   --   --   --   --   --   ALBUMIN 2.9*  --   --   --   --   --   --   MG  --   --  1.8  --   --   --   --   CRP  --   --   --   --   --  8.6*  --   PROCALCITON  --   --   --   --   --   --  0.21  LATICACIDVEN 1.7  --   --   --   --   --   --   INR 1.0  --   --   --   --  1.8*  --          Radiology Studies: DG Chest Port 1 View  Result Date: 08/14/2021 CLINICAL DATA:  Shortness of breath EXAM: PORTABLE CHEST 1 VIEW COMPARISON:  08/12/2021 FINDINGS: Hazy and streaky retrocardiac opacity. Aortic tortuosity. Normal heart size. Implantable loop recorder. The lungs are clear of edema. No pleural fluid or pneumothorax. IMPRESSION: Retrocardiac opacity with shape favoring atelectasis. Electronically Signed   By: Jorje Guild M.D.   On: 08/14/2021 09:14   VAS [place] TRANSCRANIAL DOPPLER W BUBBLES  Result Date: 08/13/2021  Transcranial Doppler with Bubble Patient Name:  Michaela Rios [facility]  Date of Exam:   08/13/2021 Medical Rec #: [id number]  Accession #:    [id number] Date of Birth: June 11, 1951          Patient Gender: F Patient Age:   48 years Exam Location:  [facility] Procedure:      VAS [place] TRANSCRANIAL DOPPLER W BUBBLES Referring Phys: Lelan Pons FRANCOIS --------------------------------------------------------------------------------  Indications: Stroke. Comparison Study: No prior studies. Performing Technologist: Oliver Hum RVT  Examination Guidelines: A complete evaluation includes B-mode imaging, spectral Doppler, color Doppler, and power Doppler as needed of all accessible portions of each vessel. Bilateral testing is considered an integral part of a complete examination. Limited examinations for reoccurring indications may be performed as noted.  Summary:  A vascular evaluation was performed. The right middle cerebral artery was studied. An IV was inserted  into the patient's right Cephalic vein.. Verbal informed consent was obtained.  A near curtain effect was noted with and without valsalva. Moderate to large, Spencer grade 5 PFO. *See table(s) above for TCD measurements and observations.    Preliminary    VAS [place] LOWER EXTREMITY VENOUS (DVT)  Result Date: 08/13/2021  Lower Venous DVT Study Patient Name:  Michaela Rios [facility]  Date of Exam:   08/13/2021 Medical Rec #: [id number]          Accession #:    [id number] Date of Birth: [birth year]          Patient Gender: F Patient Age:   33 years Exam Location:  [facility] Procedure:      VAS [place] LOWER EXTREMITY VENOUS (DVT) Referring Phys: Lelan Pons FRANCOIS --------------------------------------------------------------------------------  Indications: Stroke. Other Indications: PFO. Limitations: Poor ultrasound/tissue interface and patient positioning, patient immobility. Comparison Study: No prior studies. Performing Technologist: Oliver Hum RVT  Examination Guidelines: A complete evaluation includes B-mode imaging, spectral Doppler, color Doppler, and power Doppler as needed of all accessible portions of each vessel. Bilateral testing is considered an integral part of a complete examination. Limited examinations for reoccurring indications may be performed as noted. The reflux portion of the exam is performed with the patient in reverse Trendelenburg.  +---------+---------------+---------+-----------+----------+--------------+ RIGHT    CompressibilityPhasicitySpontaneityPropertiesThrombus Aging +---------+---------------+---------+-----------+----------+--------------+ CFV      Full           Yes      Yes                                 +---------+---------------+---------+-----------+----------+--------------+ SFJ      Full                                                        +---------+---------------+---------+-----------+----------+--------------+ FV Prox  Full                                                         +---------+---------------+---------+-----------+----------+--------------+ FV Mid   Full                                                        +---------+---------------+---------+-----------+----------+--------------+  FV DistalFull                                                        +---------+---------------+---------+-----------+----------+--------------+ PFV      Full                                                        +---------+---------------+---------+-----------+----------+--------------+ POP      Full           Yes      Yes                                 +---------+---------------+---------+-----------+----------+--------------+ PTV      Full                                                        +---------+---------------+---------+-----------+----------+--------------+ PERO     Full                                                        +---------+---------------+---------+-----------+----------+--------------+   +---------+---------------+---------+-----------+----------+--------------+ LEFT     CompressibilityPhasicitySpontaneityPropertiesThrombus Aging +---------+---------------+---------+-----------+----------+--------------+ CFV      Full           Yes      Yes                                 +---------+---------------+---------+-----------+----------+--------------+ SFJ      Full                                                        +---------+---------------+---------+-----------+----------+--------------+ FV Prox  Full                                                        +---------+---------------+---------+-----------+----------+--------------+ FV Mid   Full                                                        +---------+---------------+---------+-----------+----------+--------------+ FV Distal               Yes      Yes                                  +---------+---------------+---------+-----------+----------+--------------+  PFV      Full                                                        +---------+---------------+---------+-----------+----------+--------------+ POP      Full           Yes      Yes                                 +---------+---------------+---------+-----------+----------+--------------+ PTV      Full                                                        +---------+---------------+---------+-----------+----------+--------------+ PERO     Full                                                        +---------+---------------+---------+-----------+----------+--------------+    Summary: RIGHT: - There is no evidence of deep vein thrombosis in the lower extremity. However, portions of this examination were limited- see technologist comments above.  - No cystic structure found in the popliteal fossa.  LEFT: - There is no evidence of deep vein thrombosis in the lower extremity. However, portions of this examination were limited- see technologist comments above.  - No cystic structure found in the popliteal fossa.  *See table(s) above for measurements and observations.    Preliminary    CT HEAD WO CONTRAST (CHARM STUDY)  Result Date: 08/14/2021 CLINICAL DATA:  Mild neurologic decline EXAM: CT HEAD WITHOUT CONTRAST TECHNIQUE: Contiguous axial images were obtained from the base of the skull through the vertex without intravenous contrast. COMPARISON:  CTA head 05/26/2021, MRI brain 08/01/2021. 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 Periventricular white matter changes, likely the sequela of chronic small vessel ischemic disease. No acute infarct, hemorrhage, mass, mass effect, or midline shift. No definite hydrocephalus. Vascular: No hyperdense vessel or unexpected calcification. Skull: Normal. Negative for fracture or focal lesion. Sinuses/Orbits: No acute finding. Other: The  mastoids are well aerated. IMPRESSION: No acute intracranial process. Electronically Signed   By: Merilyn Baba M.D.   On: 08/14/2021 01:04     Scheduled Meds:  amLODipine  5 mg Oral Daily   aspirin EC  81 mg Oral Daily   atorvastatin  40 mg Oral QHS   buPROPion  150 mg Oral Daily   clopidogrel  75 mg Oral Daily   enoxaparin (LOVENOX) injection  40 mg Subcutaneous Q24H   metoprolol succinate  100 mg Oral Daily   sodium chloride flush  3 mL Intravenous Q12H   topiramate  100 mg Oral BID   venlafaxine XR  75 mg Oral Daily   cyanocobalamin  1,000 mcg Oral Daily   Continuous Infusions:  lactated ringers 100 mL/hr at 08/14/21 0009     LOS: 4 days    Time spent: 35 minutes  Signature  Lala Lund M.D on 08/14/2021 at 12:10 PM   -  To page go to [email or web address]

## 2021-08-15 ENCOUNTER — Inpatient Hospital Stay (HOSPITAL_COMMUNITY): Payer: Medicare HMO

## 2021-08-15 DIAGNOSIS — R509 Fever, unspecified: Secondary | ICD-10-CM

## 2021-08-15 DIAGNOSIS — I639 Cerebral infarction, unspecified: Secondary | ICD-10-CM | POA: Diagnosis not present

## 2021-08-15 LAB — BLOOD GAS, ARTERIAL
Acid-base deficit: 5.5 mmol/L — ABNORMAL HIGH (ref 0.0–2.0)
Bicarbonate: 18.1 mmol/L — ABNORMAL LOW (ref 20.0–28.0)
FIO2: 21
O2 Saturation: 94.7 %
Patient temperature: 38.5
pCO2 arterial: 30.2 mmHg — ABNORMAL LOW (ref 32.0–48.0)
pH, Arterial: 7.403 (ref 7.350–7.450)
pO2, Arterial: 82.5 mmHg — ABNORMAL LOW (ref 83.0–108.0)

## 2021-08-15 LAB — CULTURE, BLOOD (ROUTINE X 2)
Culture: NO GROWTH
Culture: NO GROWTH
Special Requests: ADEQUATE

## 2021-08-15 LAB — CBC WITH DIFFERENTIAL/PLATELET
Abs Immature Granulocytes: 0.12 10*3/uL — ABNORMAL HIGH (ref 0.00–0.07)
Basophils Absolute: 0 10*3/uL (ref 0.0–0.1)
Basophils Relative: 0 %
Eosinophils Absolute: 0.2 10*3/uL (ref 0.0–0.5)
Eosinophils Relative: 2 %
HCT: 35.3 % — ABNORMAL LOW (ref 36.0–46.0)
Hemoglobin: 11.6 g/dL — ABNORMAL LOW (ref 12.0–15.0)
Immature Granulocytes: 1 %
Lymphocytes Relative: 6 %
Lymphs Abs: 0.6 10*3/uL — ABNORMAL LOW (ref 0.7–4.0)
MCH: 27.6 pg (ref 26.0–34.0)
MCHC: 32.9 g/dL (ref 30.0–36.0)
MCV: 84 fL (ref 80.0–100.0)
Monocytes Absolute: 0.6 10*3/uL (ref 0.1–1.0)
Monocytes Relative: 5 %
Neutro Abs: 9.7 10*3/uL — ABNORMAL HIGH (ref 1.7–7.7)
Neutrophils Relative %: 86 %
Platelets: 391 10*3/uL (ref 150–400)
RBC: 4.2 MIL/uL (ref 3.87–5.11)
RDW: 16.3 % — ABNORMAL HIGH (ref 11.5–15.5)
WBC: 11.2 10*3/uL — ABNORMAL HIGH (ref 4.0–10.5)
nRBC: 0 % (ref 0.0–0.2)

## 2021-08-15 LAB — URINE CULTURE: Culture: NO GROWTH

## 2021-08-15 LAB — COMPREHENSIVE METABOLIC PANEL
ALT: 11 U/L (ref 0–44)
AST: 24 U/L (ref 15–41)
Albumin: 2 g/dL — ABNORMAL LOW (ref 3.5–5.0)
Alkaline Phosphatase: 41 U/L (ref 38–126)
Anion gap: 9 (ref 5–15)
BUN: 26 mg/dL — ABNORMAL HIGH (ref 8–23)
CO2: 19 mmol/L — ABNORMAL LOW (ref 22–32)
Calcium: 8.2 mg/dL — ABNORMAL LOW (ref 8.9–10.3)
Chloride: 109 mmol/L (ref 98–111)
Creatinine, Ser: 0.77 mg/dL (ref 0.44–1.00)
GFR, Estimated: >60 mL/min (ref >60)
Glucose, Bld: 101 mg/dL — ABNORMAL HIGH (ref 70–99)
Potassium: 3.9 mmol/L (ref 3.5–5.1)
Sodium: 137 mmol/L (ref 135–145)
Total Bilirubin: 0.6 mg/dL (ref 0.3–1.2)
Total Protein: 5.5 g/dL — ABNORMAL LOW (ref 6.5–8.1)

## 2021-08-15 LAB — RESP PANEL BY RT-PCR (FLU A&B, COVID) ARPGX2
Influenza A by PCR: NEGATIVE
Influenza B by PCR: NEGATIVE
SARS Coronavirus 2 by RT PCR: NEGATIVE

## 2021-08-15 LAB — C-REACTIVE PROTEIN: CRP: 12 mg/dL — ABNORMAL HIGH (ref <1.0)

## 2021-08-15 LAB — MAGNESIUM: Magnesium: 1.9 mg/dL (ref 1.7–2.4)

## 2021-08-15 LAB — PROCALCITONIN: Procalcitonin: 0.21 ng/mL

## 2021-08-15 LAB — D-DIMER, QUANTITATIVE: D-Dimer, Quant: 3.22 ug/mL-FEU — ABNORMAL HIGH (ref 0.00–0.50)

## 2021-08-15 IMAGING — DX DG CHEST 1V PORT
1 series · 1 of 1 positions shown · non-contrast
Comparison: [DATE]

CLINICAL DATA: Retrocardiac density.

EXAM:
PORTABLE CHEST 1 VIEW

[chest]
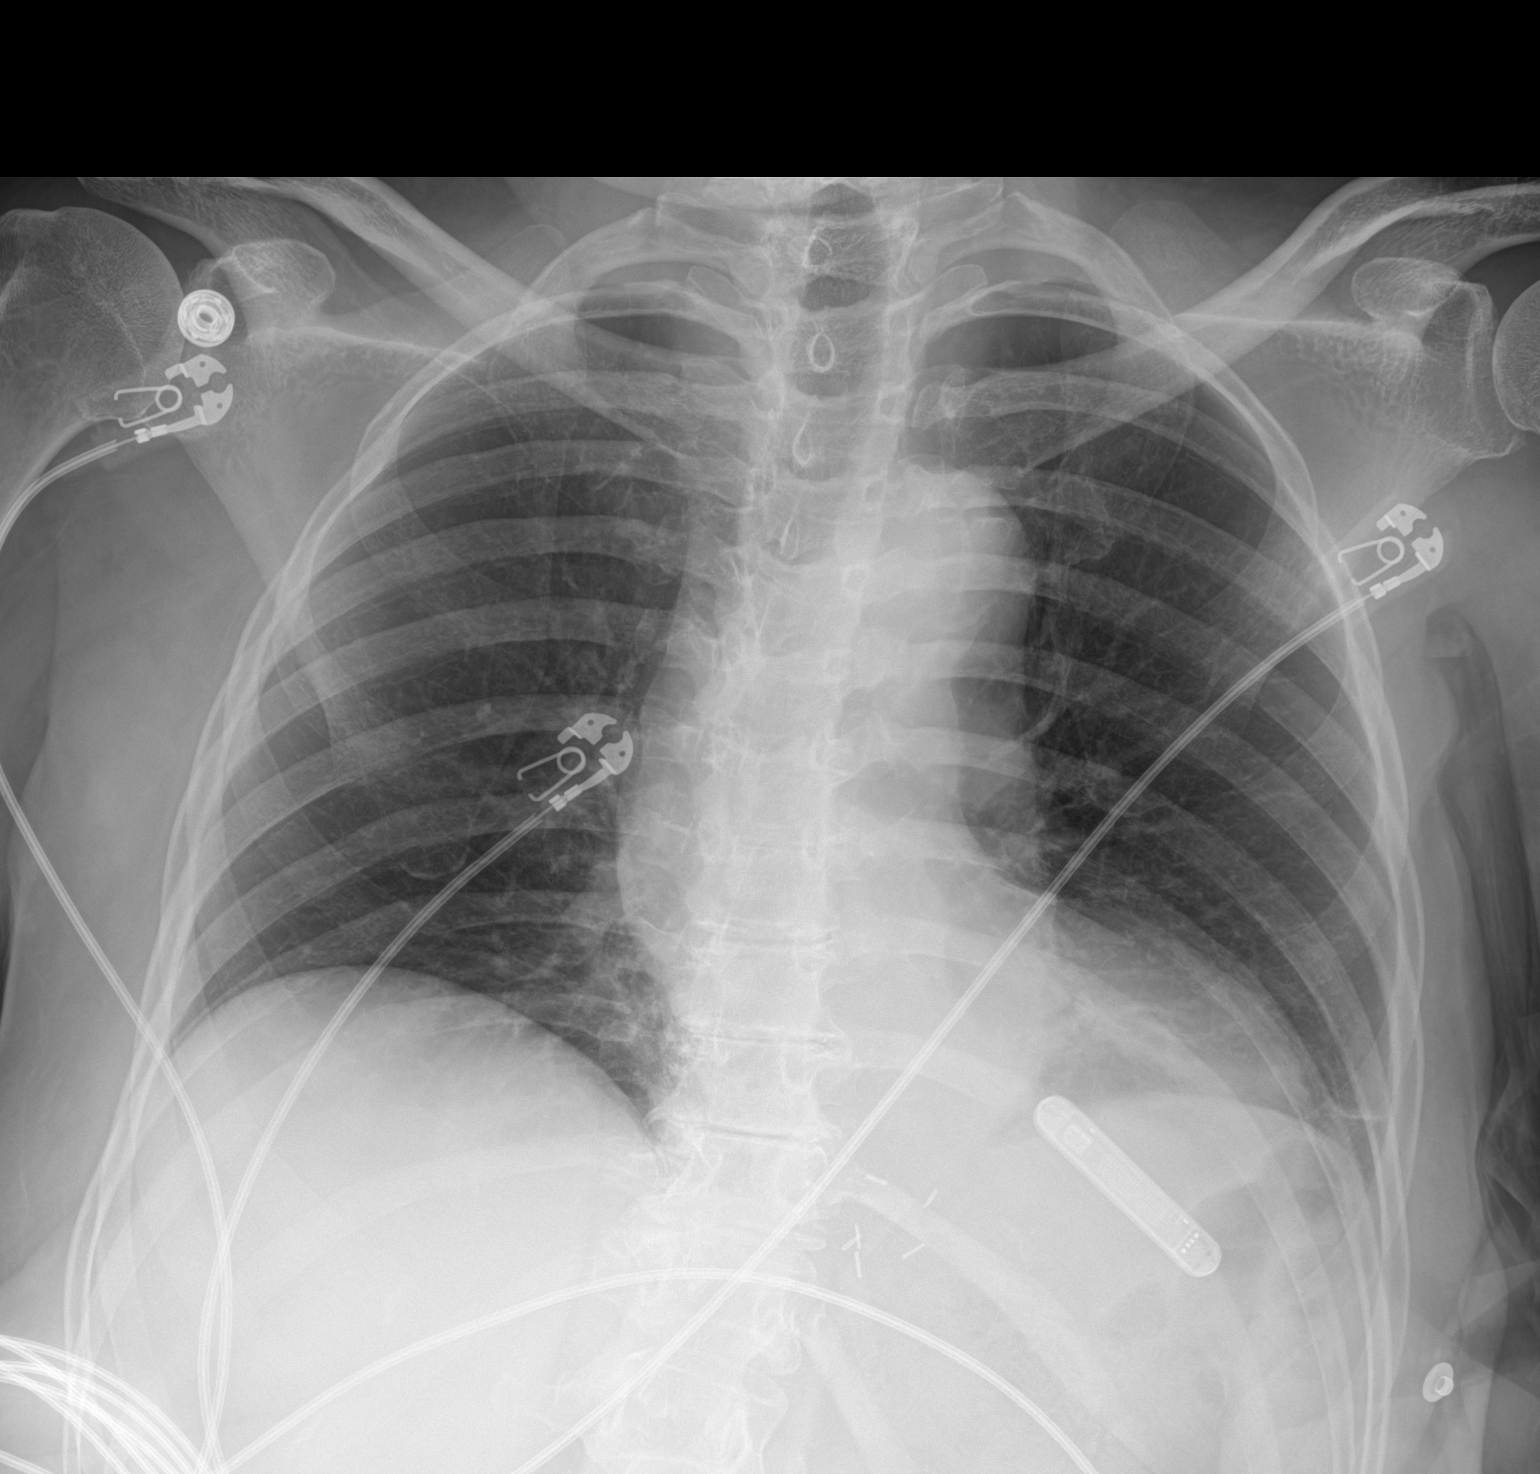

[1 of 1 positions shown; findings below may reference images not displayed]

FINDINGS: Right lung clear. Persistent retrocardiac streaky opacity, likely
atelectasis or scar. The cardiopericardial silhouette is within
normal limits for size. The visualized bony structures of the thorax
show no acute abnormality.
IMPRESSION: Persistent retrocardiac streaky opacity, likely atelectasis or scar.
This appears to be new since CT scan of [DATE]. Follow-up chest
x-ray could be used to ensure resolution.

## 2021-08-15 IMAGING — CT CT ABD-PELV W/ CM
2 of 5 series · 16 of 46 positions shown, 18 images · IV contrast (omnipaque)
Comparison: [DATE]

CLINICAL DATA: Fever, abdominal pain

EXAM:
CT ABDOMEN AND PELVIS WITH CONTRAST
TECHNIQUE: Multidetector CT imaging of the abdomen and pelvis was performed
using the standard protocol following bolus administration of
intravenous contrast.
CONTRAST:  100mL OMNIPAQUE IOHEXOL 300 MG/ML  SOLN

[Series 3: abd/ pelvis 5.0 i30f 2 · axial · 0.88mm/px · z∈[+890,+1275]mm · 13 of 89 slices shown, 15 images]
[im 6/89  soft-tissue]
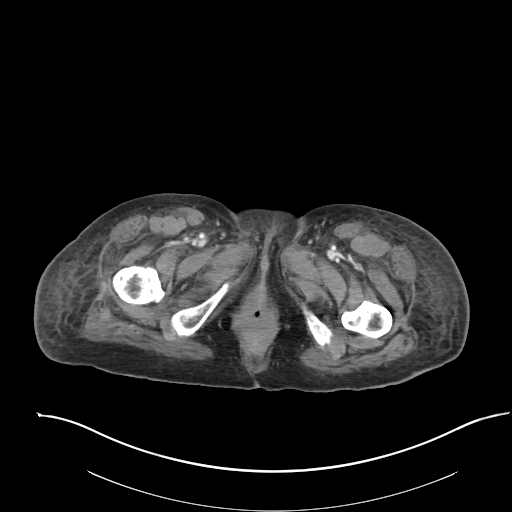
[im 6/89  bone]
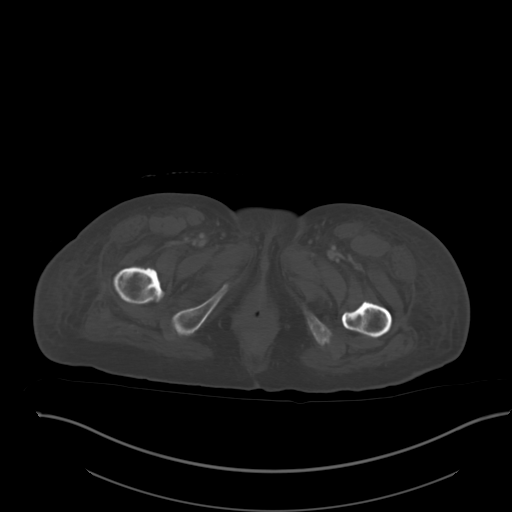
[im 11/89  soft-tissue]
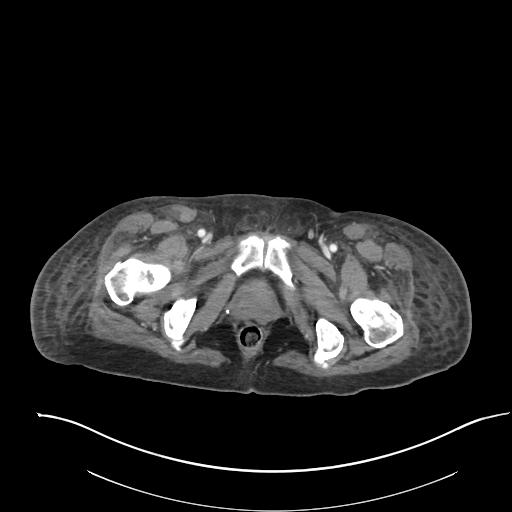
[im 21/89  soft-tissue]
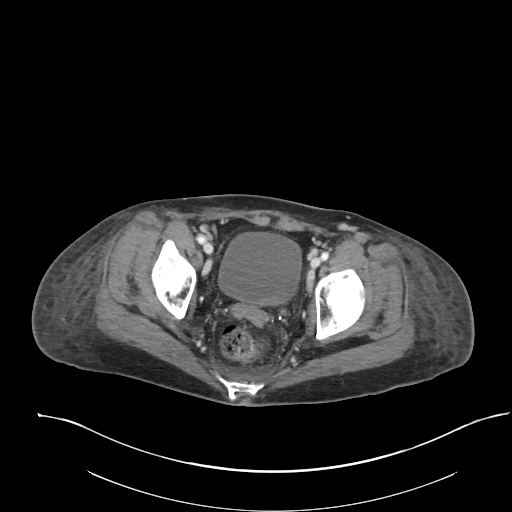
[im 26/89  soft-tissue]
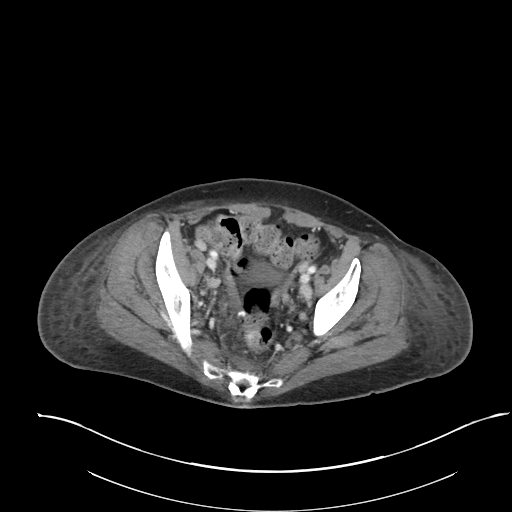
[im 32/89  soft-tissue]
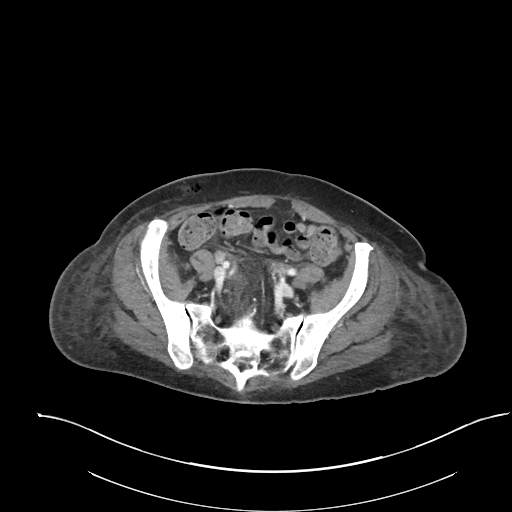
[im 37/89  soft-tissue]
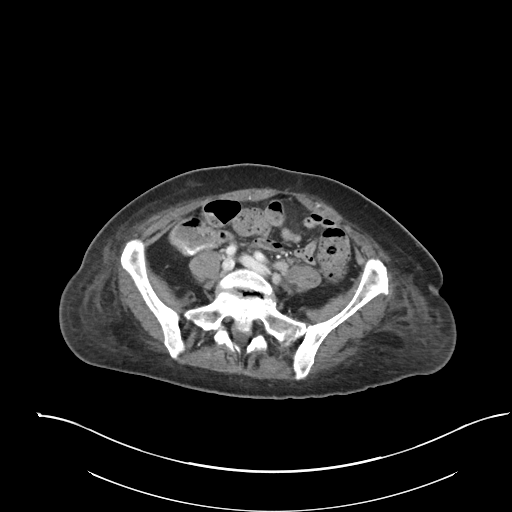
[im 47/89  soft-tissue]
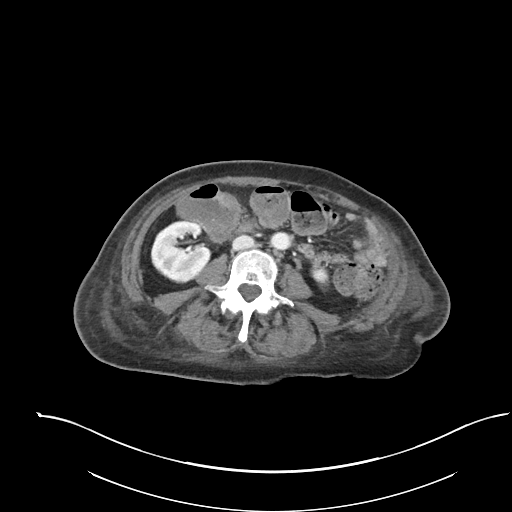
[im 52/89  soft-tissue]
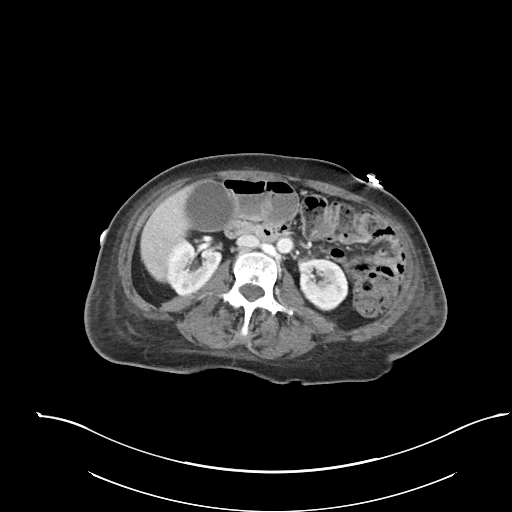
[im 57/89  soft-tissue]
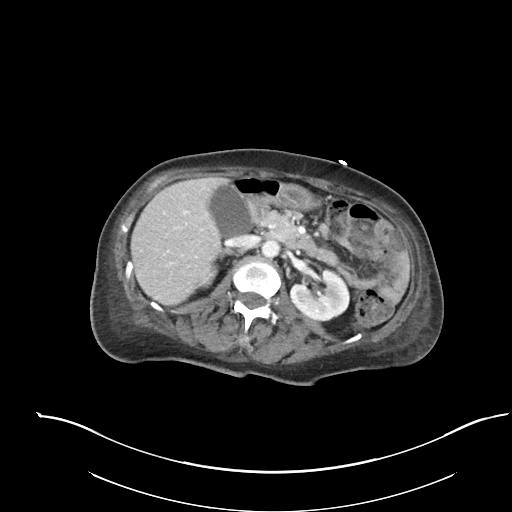
[im 57/89  bone]
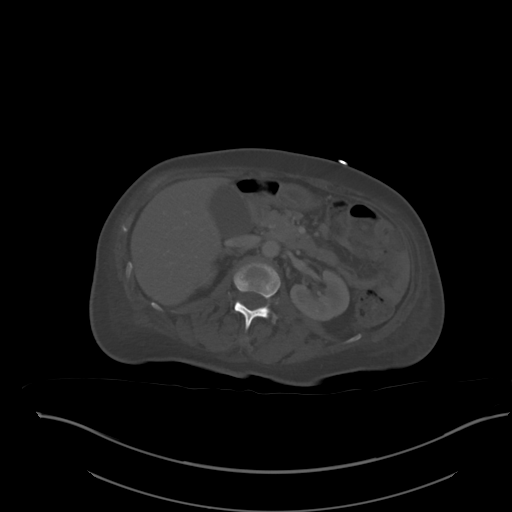
[im 63/89  soft-tissue]
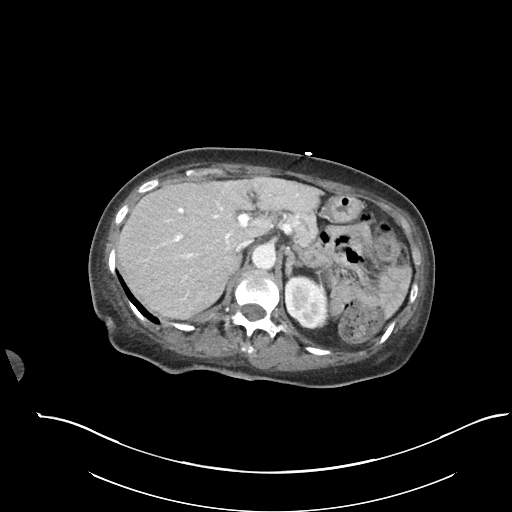
[im 68/89  soft-tissue]
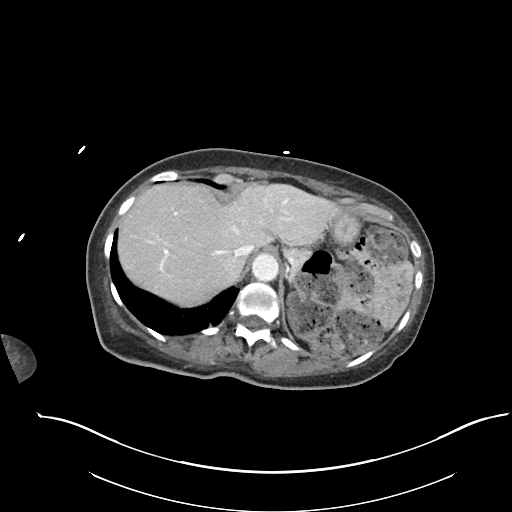
[im 78/89  soft-tissue]
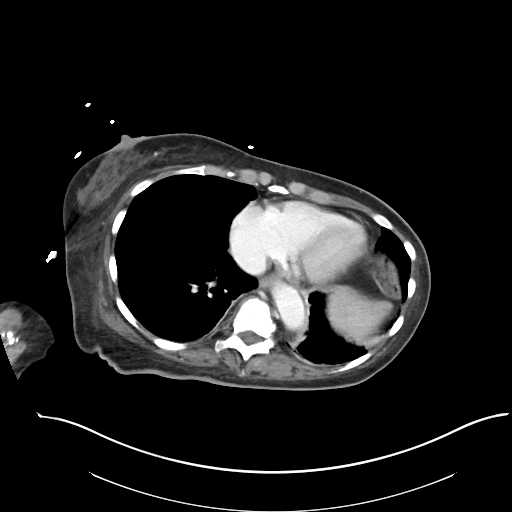
[im 83/89  soft-tissue]
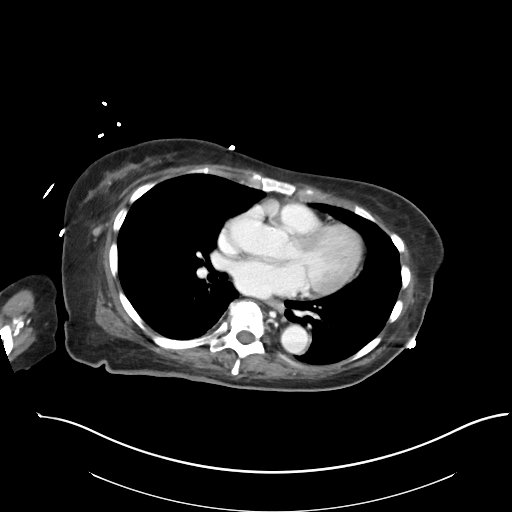

[Series 6: coronal soft tissue · coronal · 0.76mm/px · 3 of 76 slices shown]
[im 26/76  soft-tissue]
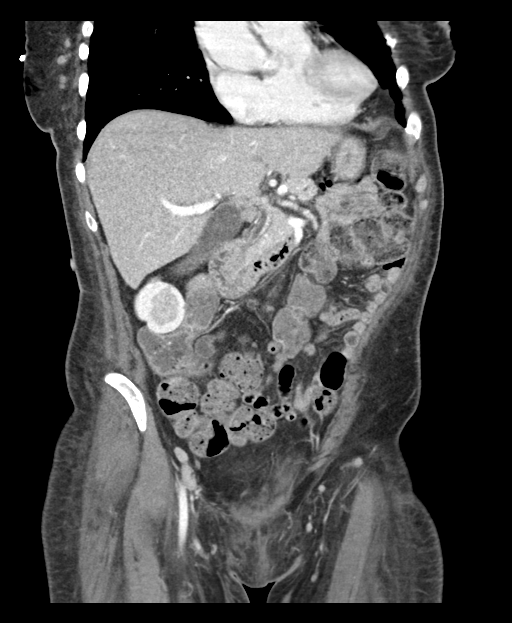
[im 34/76  soft-tissue]
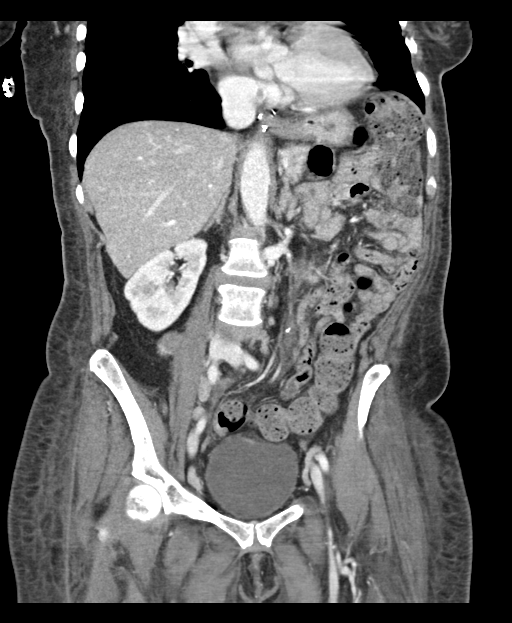
[im 42/76  soft-tissue]
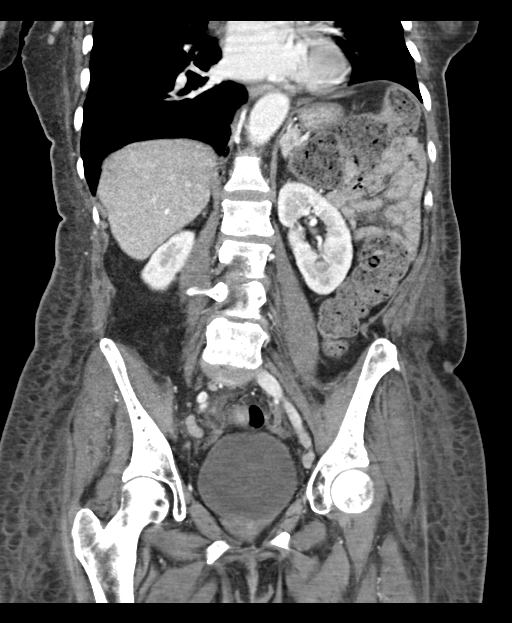

[16 of 46 positions shown; findings below may reference images not displayed]

FINDINGS: Lower chest: No acute abnormality. Unchanged elevation of the left
hemidiaphragm.

Hepatobiliary: No solid liver abnormality is seen. No gallstones,
gallbladder wall thickening, or biliary dilatation.

Pancreas: Unremarkable. No pancreatic ductal dilatation or
surrounding inflammatory changes.

Spleen: Normal in size without significant abnormality.

Adrenals/Urinary Tract: Adrenal glands are unremarkable. Kidneys are
normal, without renal calculi, solid lesion, or hydronephrosis.
Heterogeneous material within the dependent left urinary bladder,
dependently hyperdense (series 3, image 73).

Stomach/Bowel: Stomach is within normal limits. Appendix appears
normal. No evidence of bowel wall thickening, distention, or
inflammatory changes. Large burden of stool throughout the distal
colon.

Vascular/Lymphatic: Aortic atherosclerosis. No enlarged abdominal or
pelvic lymph nodes.

Reproductive: Status post hysterectomy.

Other: No abdominal wall hernia. Anasarca. No abdominopelvic
ascites.

Musculoskeletal: No acute or significant osseous findings.
IMPRESSION: 1. Heterogeneous material within the dependent left urinary bladder,
dependently hyperdense. This may reflect some combination of
excreted contrast with mixing artifact, clot, or small bladder
stones/calcific debris. Endoluminal bladder mass is difficult to
strictly exclude. Recommend delayed phase CT urogram to further
evaluate.
2. Large burden of stool throughout the distal colon.
3. Anasarca.

Aortic Atherosclerosis ([LO]-[LO]).

## 2021-08-15 IMAGING — CT CT HEAD W/O CM
3 series · 15 of 47 positions shown, 18 images · non-contrast
Comparison: [DATE]

CLINICAL DATA: Neuro deficit, stroke suspected right-sided weakness

EXAM:
CT HEAD WITHOUT CONTRAST
TECHNIQUE: Contiguous axial images were obtained from the base of the skull
through the vertex without intravenous contrast.

[Series 3: head 5.0 h30s · axial · 0.41mm/px · z∈[-158,-13]mm · 9 of 35 slices shown, 12 images]
[im 3/35  brain]
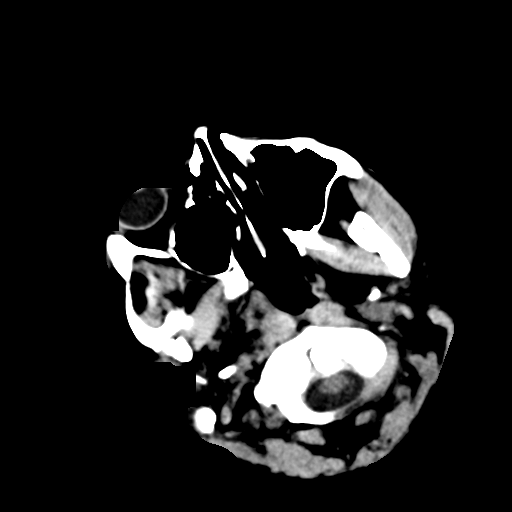
[im 3/35  bone]
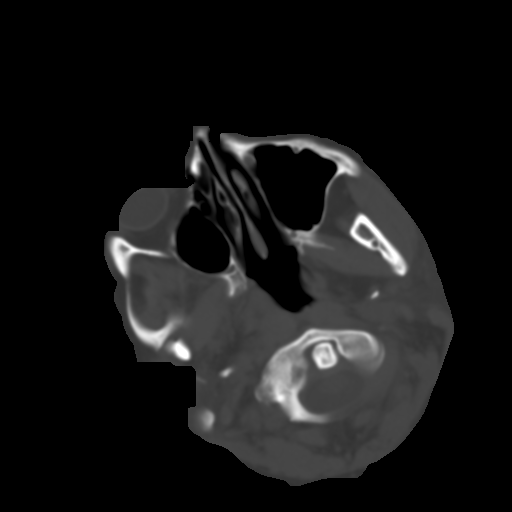
[im 6/35  brain]
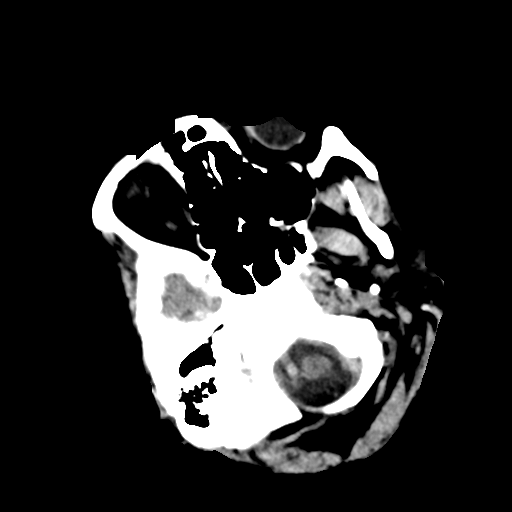
[im 10/35  brain]
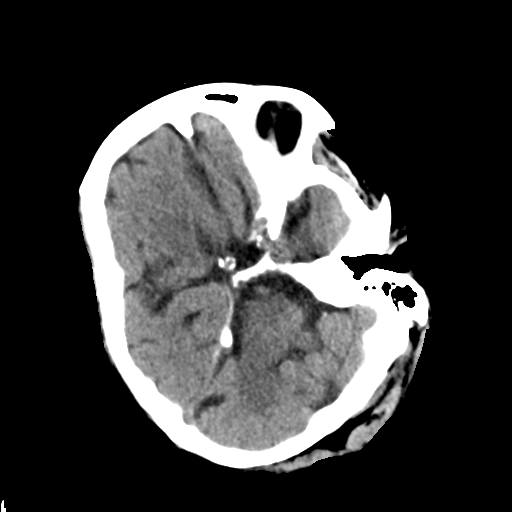
[im 13/35  brain]
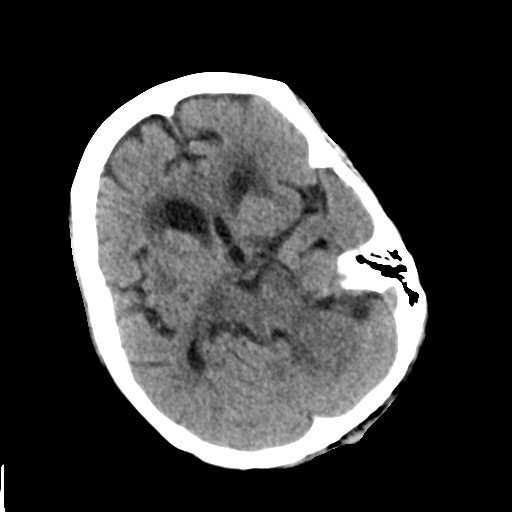
[im 18/35  brain]
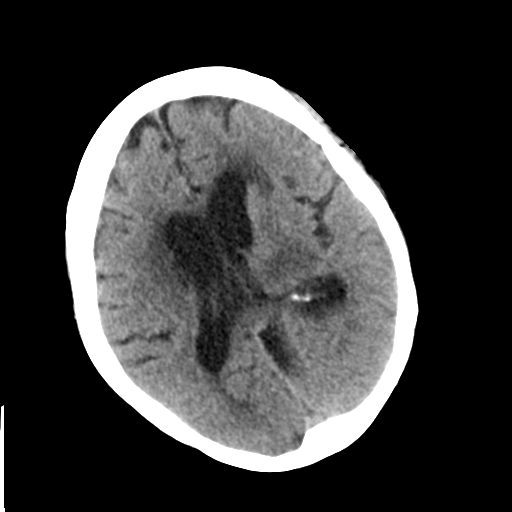
[im 18/35  bone]
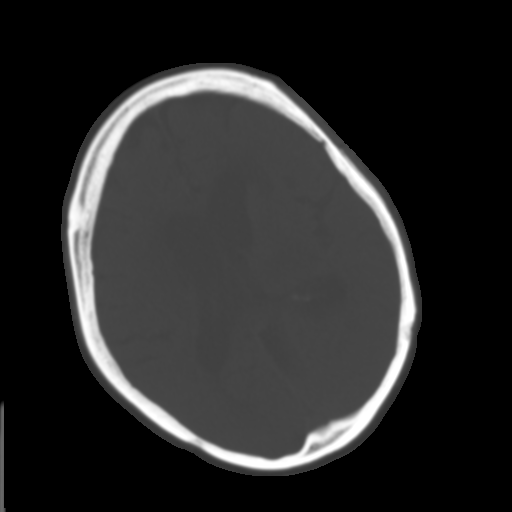
[im 22/35  brain]
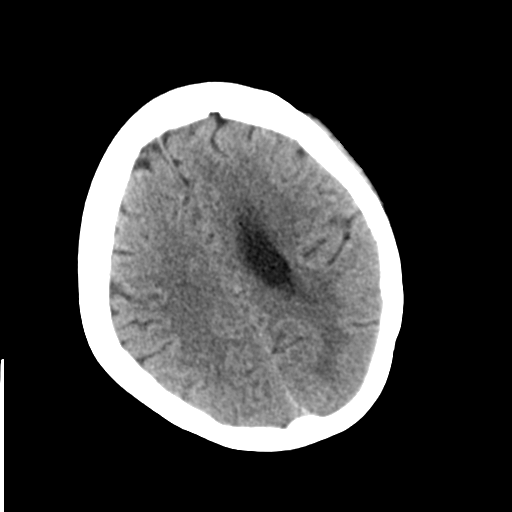
[im 25/35  brain]
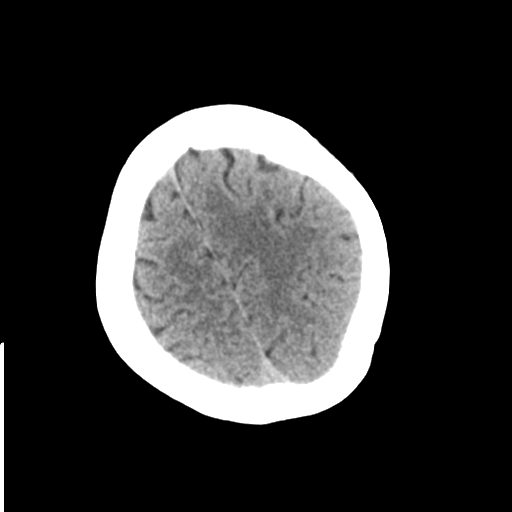
[im 29/35  brain]
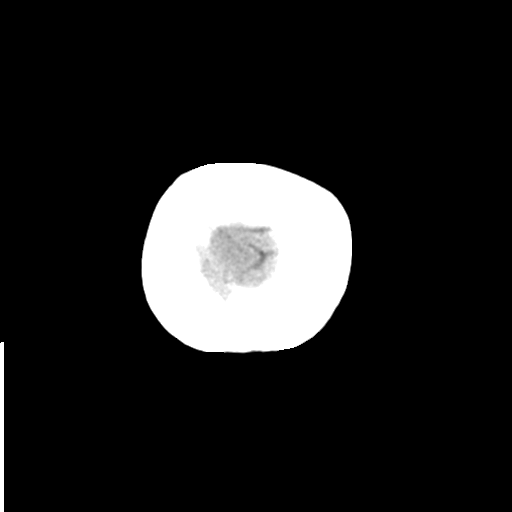
[im 32/35  brain]
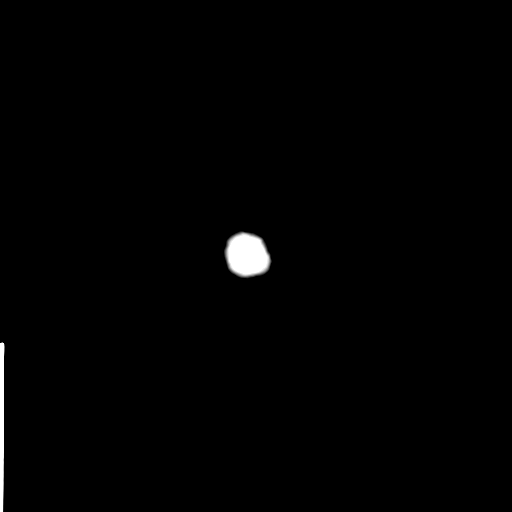
[im 32/35  bone]
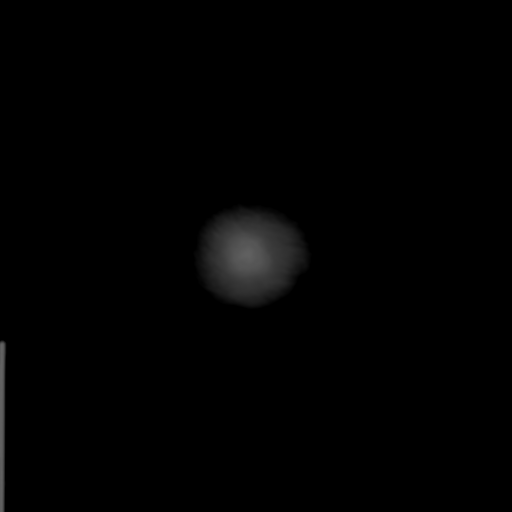

[Series 5: head 3.0 mpr cor · coronal · 0.33mm/px · 3 of 67 slices shown]
[im 23/67  brain]
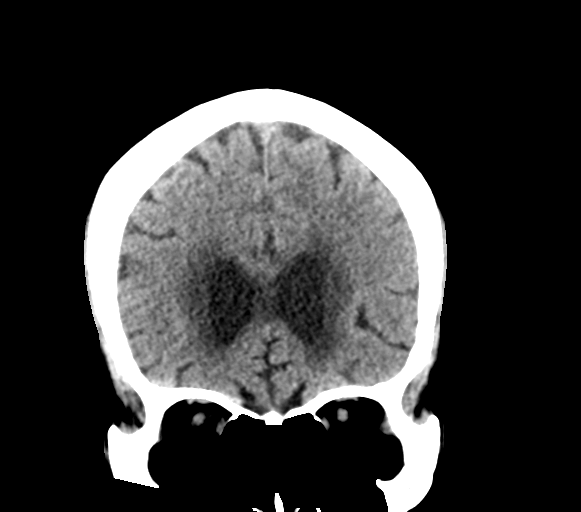
[im 30/67  brain]
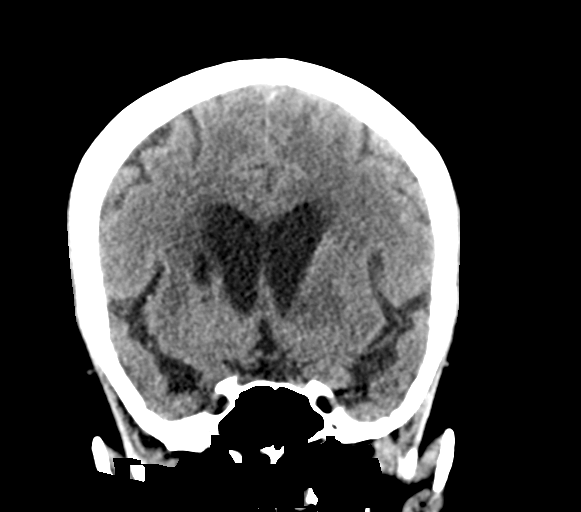
[im 37/67  brain]
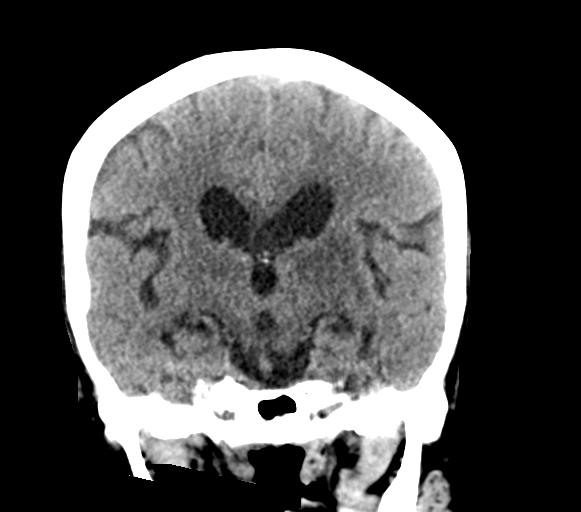

[Series 6: head 3.0 mpr sag · sagittal · 0.33mm/px · 3 of 64 slices shown]
[im 26/64  brain]
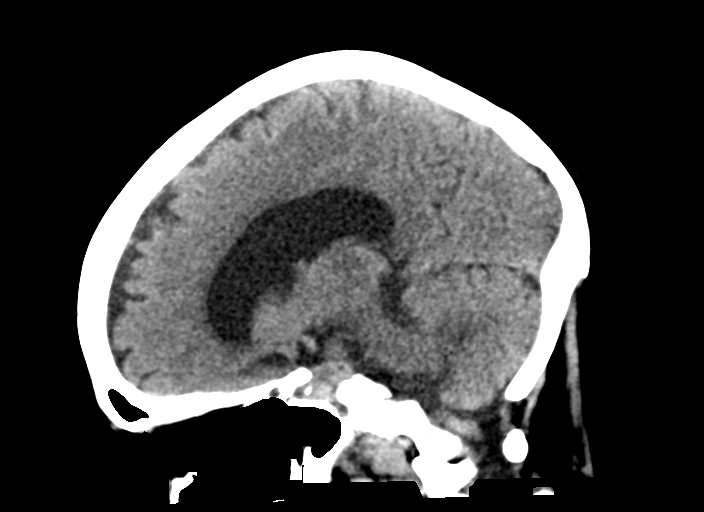
[im 32/64  brain]
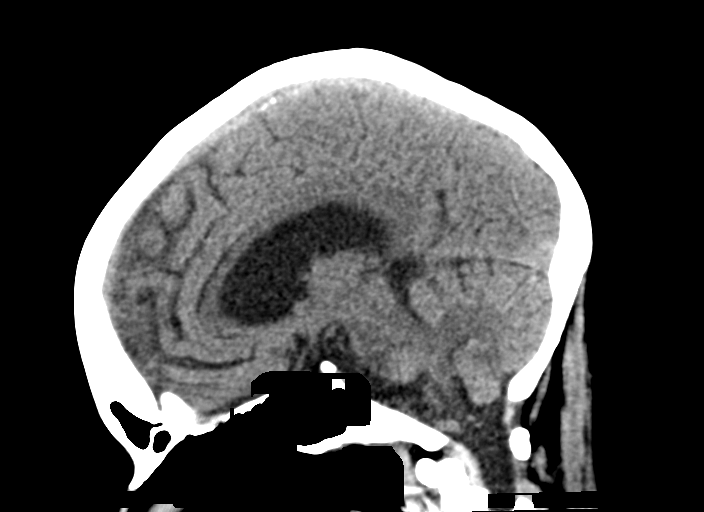
[im 39/64  brain]
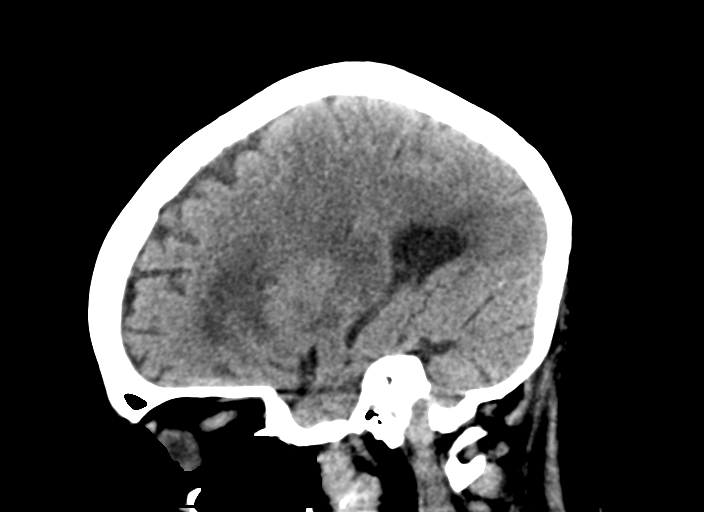

[15 of 47 positions shown; findings below may reference images not displayed]

FINDINGS: Brain: No significant change from prior exam. Redemonstrated right
basal ganglia and left thalamic infarcts. Periventricular white
matter changes, likely the sequela of chronic small vessel ischemic
disease. No acute cortical infarct, hemorrhage, mass, mass effect,
or midline shift. No hydrocephalus.

Vascular: No hyperdense vessel

Skull: Normal. Negative for fracture or focal lesion.

Sinuses/Orbits: No acute finding.

Other: The mastoids are well aerated.
IMPRESSION: No acute intracranial process. No significant change from the prior
exam.

## 2021-08-15 MED ORDER — LACTATED RINGERS IV BOLUS
1000.0000 mL | Freq: Once | INTRAVENOUS | Status: AC
  Administered 2021-08-15: 1000 mL via INTRAVENOUS

## 2021-08-15 MED ORDER — IOHEXOL 300 MG/ML  SOLN
100.0000 mL | Freq: Once | INTRAMUSCULAR | Status: AC | PRN
  Administered 2021-08-15: 100 mL via INTRAVENOUS

## 2021-08-15 MED ORDER — LACTATED RINGERS IV SOLN: INTRAVENOUS | Status: DC

## 2021-08-15 NOTE — Progress Notes (Signed)
Rapid response activated in the night due to change in mental status and vital signs, prn medication was administered, Dr. Claria Dice was notified, new order for CT Head done. Patient responded to treatment, patient is stable and vital signs WNL. Will continue to monitor

## 2021-08-15 NOTE — Progress Notes (Signed)
Physical Therapy Treatment Patient Details Name: Michaela Rios MRN: [id number] DOB: [birth year] Today's Date: 08/15/2021   History of Present Illness Pt is a 70yo female presenting to [facility] ED on 9/13 with weakness, poor PO inntake, found to be febrile in ED. Workup for sepsis, unclear etiology. Pt with admission July of 2022 for CVA. Per neuro notes, acute/subacute left posterior putamen and caudate head on MRI on 9/4. Pt placed on COVID precautions pending test on 9/18. 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    PT Comments    Pt admitted with above diagnosis. Pt was able to stand with +2 max total assist as she needed to be cleaned of urine as purewick leaked. Pt then transferred to chair with +2 max to total assist. Given pts poor progression thus far, recommend SNF for rehab and decreased frequency to 3 x week as this is appropriate for pts current d/c plan.  Pt currently with functional limitations due to balance and endurance deficits. Pt will benefit from skilled PT to increase their independence and safety with mobility to allow discharge to the venue listed below.      Recommendations for follow up therapy are one component of a multi-disciplinary discharge planning process, led by the attending physician.  Recommendations may be updated based on patient status, additional functional criteria and insurance authorization.  Follow Up Recommendations  Supervision/Assistance - 24 hour;SNF     Equipment Recommendations  3in1 (PT);Wheelchair cushion (measurements PT);Wheelchair (measurements PT)    Recommendations for Other Services       Precautions / Restrictions Precautions Precautions: Fall Precaution Comments: airborne/contact  precautions Restrictions Weight Bearing Restrictions: No     Mobility  Bed Mobility Overal bed mobility: Needs Assistance Bed Mobility: Rolling;Supine to Sit;Sit to Supine Rolling: Total assist;Max assist;+2 for physical  assistance   Supine to sit: Total assist;Max assist;+2 for physical assistance     General bed mobility comments: assist for all aspects of mobility. Little to no effort from pt.    Transfers Overall transfer level: Needs assistance Equipment used: 2 person hand held assist Transfers: Sit to/from [facility] Sit to Stand: Max assist;+2 physical assistance;From elevated surface;Total assist         General transfer comment: Pt was soaked with urine as purewick was leaking.Cleaned pt and changed gown while pt sitting EOB.  Pt assisted to standing with +2 max assist blocking pts knees with pt flexed at trunk and knees.  Pt did give effort however.  After NT cleaned pts bottom and legs, pivoted her with max to total assist of 2 to chair.  Ambulation/Gait             General Gait Details: unable   Stairs             Wheelchair Mobility    Modified Rankin (Stroke Patients Only) Modified Rankin (Stroke Patients Only) Pre-Morbid Rankin Score: Moderately severe disability Modified Rankin: Severe disability     Balance Overall balance assessment: Needs assistance Sitting-balance support: Feet supported;Single extremity supported Sitting balance-Leahy Scale: Poor Sitting balance - Comments: Therapist providing max support with trunk to trunk contact on pt's L side, with pillow as buffer/padding between. Pt sat EOB x 15 minutes.  Pt leans to left and posteriorly needing constant assist and cues.   Standing balance support: Bilateral upper extremity supported;During functional activity Standing balance-Leahy Scale: Zero Standing balance comment: Pt reliant on BUE external support from PT (max assist) to remain standing in static and  dynamic tasks. Demonstrated posterior lean.                            Cognition Arousal/Alertness: Awake/alert Behavior During Therapy: Flat affect Overall Cognitive Status: No family/caregiver present to  determine baseline cognitive functioning                                 General Comments: increased time and effort when cued      Exercises      General Comments        Pertinent Vitals/Pain Pain Assessment: No/denies pain    Home Living                      Prior Function            PT Goals (current goals can now be found in the care plan section) Acute Rehab PT Goals Patient Stated Goal: not stated Progress towards PT goals: Not progressing toward goals - comment (multiple medical issues)    Frequency    Min 3X/week      PT Plan Discharge plan needs to be updated;Frequency needs to be updated    Co-evaluation              AM-PAC PT "6 Clicks" Mobility   Outcome Measure  Help needed turning from your back to your side while in a flat bed without using bedrails?: Total Help needed moving from lying on your back to sitting on the side of a flat bed without using bedrails?: Total Help needed moving to and from a bed to a chair (including a wheelchair)?: Total Help needed standing up from a chair using your arms (e.g., wheelchair or bedside chair)?: Total Help needed to walk in hospital room?: Total Help needed climbing 3-5 steps with a railing? : Total 6 Click Score: 6    End of Session Equipment Utilized During Treatment: Gait belt Activity Tolerance: Patient limited by fatigue Patient left: with call bell/phone within reach;in chair;with chair alarm set Nurse Communication: Mobility status;Need for lift equipment PT Visit Diagnosis: Other symptoms and signs involving the nervous system (R29.898);Muscle weakness (generalized) (M62.81);Unsteadiness on feet (R26.81)     Time: [id number] PT Time Calculation (min) (ACUTE ONLY): 38 min  Charges:  $Therapeutic Activity: 38-52 mins                     Michaela Rios M,PT Acute Rehab Services [phone] (pager)    Michaela Rios 08/15/2021, 2:22 PM

## 2021-08-15 NOTE — Plan of Care (Signed)
  Problem: Education: Goal: Knowledge of disease or condition will improve Outcome: Progressing Goal: Knowledge of secondary prevention will improve Outcome: Progressing   Problem: Coping: Goal: Will verbalize positive feelings about self 08/15/2021 0420 by Briant Cedar, RN Outcome: Progressing 08/15/2021 0410 by Briant Cedar, RN Outcome: Progressing   Problem: Self-Care: Goal: Verbalization of feelings and concerns over difficulty with self-care will improve Outcome: Progressing   Problem: Nutrition: Goal: Risk of aspiration will decrease Outcome: Progressing   Problem: Clinical Measurements: Goal: Diagnostic test results will improve Outcome: Progressing Goal: Respiratory complications will improve 08/15/2021 0420 by Briant Cedar, RN Outcome: Progressing 08/15/2021 0414 by Briant Cedar, RN Outcome: Progressing 08/15/2021 0410 by Briant Cedar, RN Outcome: Progressing   Problem: Activity: Goal: Risk for activity intolerance will decrease Outcome: Progressing   Problem: Pain Managment: Goal: General experience of comfort will improve Outcome: Progressing

## 2021-08-15 NOTE — Plan of Care (Signed)
  Problem: Coping: Goal: Will verbalize positive feelings about self Outcome: Progressing   Problem: Clinical Measurements: Goal: Respiratory complications will improve Outcome: Progressing   Problem: Pain Managment: Goal: General experience of comfort will improve Outcome: Progressing

## 2021-08-15 NOTE — Significant Event (Signed)
Rapid Response Event Note   Reason for Call :  Lethargy  Initial Focused Assessment:  Pt lying in bed with eyes open. Her breathing is tachypneic and mildly labored. She will nod/shake head appropriately to questions however will not speak. She will follow commands and moves all extremities equally, though very weak. Lungs diminished t/o. Pupils 4, equal and sluggish. Skin hot to touch.  T-103.1, HR-122, BP-120/77, RR-22, SpO2-93% on RA  Interventions:  CBG-96 Tylenol for fever(already ordered) ABG CBC/BMP Plan of Care:  Fever could explain pt's lethargy, tachycardia, and tachypnea. Tx fever. Await lab results. Continue to monitor pt closely. Call RRT if further assistance needed.   Event Summary:   MD Notified: Bedside RN to notify MD. Call [phone] Arrival [phone] End [id number]  Dillard Essex, RN

## 2021-08-15 NOTE — Progress Notes (Signed)
VASCULAR LAB    Bilateral upper extremity venous duplex has been performed.  See CV proc for preliminary results.   Anayla Giannetti, RVT 08/15/2021, 3:00 PM

## 2021-08-15 NOTE — Progress Notes (Signed)
Patient more confused overnight.  UA done earlier was positive, patient was started on Rocephin.   CT head without contrast ordered as patient has unilateral deficits

## 2021-08-15 NOTE — Plan of Care (Signed)
  Problem: Education: Goal: Knowledge of disease or condition will improve Outcome: Progressing Goal: Knowledge of secondary prevention will improve Outcome: Progressing   Problem: Coping: Goal: Will verbalize positive feelings about self Outcome: Progressing   Problem: Self-Care: Goal: Verbalization of feelings and concerns over difficulty with self-care will improve Outcome: Progressing   Problem: Nutrition: Goal: Risk of aspiration will decrease Outcome: Progressing   Problem: Clinical Measurements: Goal: Respiratory complications will improve 08/15/2021 0414 by Briant Cedar, RN Outcome: Progressing 08/15/2021 0410 by Briant Cedar, RN Outcome: Progressing   Problem: Pain Managment: Goal: General experience of comfort will improve Outcome: Progressing

## 2021-08-15 NOTE — Progress Notes (Signed)
   08/14/21 2318  Assess: MEWS Score  Temp (!) 103.1 F (39.5 C)  BP 120/77  Pulse Rate (!) 122  ECG Heart Rate (!) 122  Resp (!) 22  SpO2 93 %  O2 Device Room Air  Patient Activity (if Appropriate) In bed  Assess: MEWS Score  MEWS Temp 2  MEWS Systolic 0  MEWS Pulse 2  MEWS RR 1  MEWS LOC 0  MEWS Score 5  MEWS Score Color Red  Assess: if the MEWS score is Yellow or Red  Were vital signs taken at a resting state? Yes  Focused Assessment Change from prior assessment (see assessment flowsheet)  Early Detection of Sepsis Score *See Row Information* High  MEWS guidelines implemented *See Row Information* Yes  Treat  MEWS Interventions Administered prn meds/treatments  Pain Scale PAINAD  Pain Score 0  Take Vital Signs  Increase Vital Sign Frequency  Red: Q 1hr X 4 then Q 4hr X 4, if remains red, continue Q 4hrs  Escalate  MEWS: Escalate Red: discuss with charge nurse/RN and provider, consider discussing with RRT  Notify: Charge Nurse/RN  Name of Charge Nurse/RN Notified Chris, RN  Date Charge Nurse/RN Notified 08/14/21  Time Charge Nurse/RN Notified 2325  Notify: Provider  Provider Name/Title Clotilde Dieter MD  Date Provider Notified 08/15/21  Time Provider Notified 0005  Notification Type Page  Notification Reason Change in status  Provider response See new orders  Date of Provider Response 08/15/21  Time of Provider Response 0037  Notify: Rapid Response  Name of Rapid Response RN Notified Mandy, RN  Date Rapid Response Notified 08/14/21  Time Rapid Response Notified 2330  Document  Patient Outcome Stabilized after interventions  Progress note created (see row info) Yes

## 2021-08-15 NOTE — Progress Notes (Signed)
[facility] PROGRESS NOTE    Michaela Rios  [id number] DOB: 1951/05/08 DOA: 08/15/2021 PCP: Binnie Rail, MD      Brief Narrative:  Michaela Rios is a 70 y.o. F with HTN, CKD IIIa, and PVD as well as recent admission for stroke and FUO presented again with fever and weakness.    History of current illness begins late June (prior to which patient was independent, no dementia, lived with husband, normal with ADLs):  6/27: Initially presented w/ 1 week urinary symptoms, generalized weakness -> temp 101F, HR 140s in the ER, started on treatment for sepsis 6/29: Still encephalopathic out of proportion to expected for sepsis, CT head obtained, showed abnormal signal in BGs -> f/u MRI showed acute right BG stroke, acute left BG/thalamus/caudate T2 hyperintensity, neuro consulted, no focal weakness at that time 6/30: first mention of left sided weakness on exam; LP this date, culture negative, no WBC, Crypto neg, HSV PCR neg, protein elevated and 22 RBCs in tubes 1 and 4, not enough fluid for flow cytometry 7/2: Recurrent fever, ID consulted for FUO  7/2-7/5: Work up for systemic vasculitis included ANCA, complements, ANA, anti-dsDNA, Ro/La Abs, RF.  All negative except ANA+, CRP ~7.  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  The patient's fever resolved with stopping antibiotics.  She was treated with Solu-medrol 1g for 3 days.    7/5: Cerebral angiography was unremarkable.  7/12: She was discharged to CIR, where she progressed to the point of being interactive, ambulatory with a walker.   8/4: Returned home from [place] where she remained ambulatory, interactive for 1-2 weeks, then started to get worse.  9/4: MRI brain w/wo contrast on 9/4 showed evolution of right BG infarct, no change in L BG T2 hyperintensity, and  new acute/subacute infarcts in the left BG.  Around this time, the patient had worsening generalized weakness, worse PO intake, more somnolence and psychomotor slowing, and husband brought her back to the ER.    On return to the ER this admission, she was febrile, tachycardic, but WBC normal, lactate normal, CXR normal.  UA with bacteria, started on broad spectrum antibiotics and admitted.    Subjective:  Patient in bed, appears comfortable, denies any headache, no fever, no chest pain or pressure, no shortness of breath , no abdominal pain. No new focal weakness.   Assessment & Plan:  Fever - off and on could be neurological,  but LLL Infilterate on 08/14/21, high risk for aspiration, repeat BC, UA-UC, Procal and WBC trend, Unasyn for now.  Since D-dimer is elevated and lower extremity venous duplex is negative, in the setting of high fevers of unclear origin will check upper extremity venous duplex along with CT scan abdomen pelvis.  Acute metabolic encephalopathy - Could be due to CVA + PNA, TSH normal, B12 normal.  She was on 2 central acting agents venlafaxine for anxiety and Topamax for migraines, Reduced Topamax, Taper and stop venlafaxine.  Subacute strokes - Left caudate and putamen infarct embolic secondary to likely small vessel disease .  Prior history of multiple cryptogenic strokes in July 2022 with negative extensive work-up she does have a PFO but at her age she has a low rope score of 2 making PFO unlikely cause of her strokes.  Seen by CVA team, ILR interrogated, this captured no events, Afib  unlikely.  (The tachycardia noted on the report is consistent with her observed sinus tachycardia here), per CVA team -  Aspirin and Plavix for 3 weeks then Plavix alone, Continue new atorvastatin, TCD confirms PFO, Leg [place] -ve for DVT.  Torticollis - Low clinical suspicion for meningitis, this is more a left sided neck spasm, present for the last >1 week per husband. MRI brain from 9/4  showed no abnormalities in the upper cervical spine. Also, she is on no dopamine antagonists, I do not suspect a medication related dystonia.  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   Sinus tachycardia  She remains tachycardic, not eating at all.  Started IVF, stable TSH.  Hypertension  - BP normal, Continue amlodipine, metoprolol   CKD IIIa - stable.  Chronic migraines - No headaches,  reduced Topamax  Anxiety - Taper and stop venlafaxine, Reduced bupropion   Hyponatremia Mild asymptomatic  Hypokalemia Supplemented and ersovled  Anemia, normocytic Mild, asymptomatic  Lung nodule 35m incidental nodule noted last admission.  Outpt Pulm follow up.  PFO.  Cardiology outpatient cardiology follow-up for elective closure if family desires.    Disposition: Status is: Inpatient  Remains inpatient appropriate because:IV treatments appropriate due to intensity of illness or inability to take PO  Dispo: The patient is from: Home              Anticipated d/c is to: SNF              Patient currently is not medically stable to d/c.   Difficult to place patient No  Level of care: Tele   MDM: The below labs and imaging reports were reviewed and summarized above.  Medication management as above.    DVT prophylaxis: enoxaparin (LOVENOX) injection 40 mg Start: 08/16/2021 1600  Code Status: FULL  Family Communication:  Husband KLevada Dy- [phone] on 08/15/21    Consultants:   ID Neurology  Procedures:   9/13 CXR clear 9/14 Lower extremity UK[place]DVT -- no DVT 9/14 transcranial doppler with bubbles -- TCD bubble study positive for large right-to-left shunt +ve grade 5 PFO - DW Dr XErlinda Hong9/17/22 CT head 9/17 - non acute [place]oreaupper - CT abd Pelvis -   Antimicrobials:    Vancomycin, flagyl x1 on 9/13 Aztreonam 9/13 >> 9/14 Rocephin    9/15 >> 9/16 Unasyn 9/17  Culture data:    9/13 blood  culture -- ngtd 9/13 urine culture -- <30K colonies klebnsiella   Objective: Vitals:   08/15/21 0500 08/15/21 0700 08/15/21 0947 08/15/21 1100  BP: 110/79 120/87 130/88 131/87  Pulse: 92 92 100 96  Resp: '17 16  17  '$ Temp: 99 F (37.2 C) 97.6 F (36.4 C)  98.3 F (36.8 C)  TempSrc: Axillary Axillary  Oral  SpO2: 93% 100%  92%  Weight:      Height:        Intake/Output Summary (Last 24 hours) at 08/15/2021 1139 Last data filed at 08/15/2021 0742 Gross per 24 hour  Intake 1455.02 ml  Output --  Net 1455.02 ml   Filed Weights   08/11/21 2312 08/14/21 0400 08/15/21 0455  Weight: 54.5 kg 55.1 kg 57 kg    Examination:  Awake Alert, neck is tilted to left, strength 4/5 in all extremities except for the left leg which is 3/5. [place].AT,PERRAL Supple Neck,No JVD, No cervical lymphadenopathy appriciated.  Symmetrical Chest wall movement, Good air  movement bilaterally, CTAB RRR,No Gallops, Rubs or new Murmurs, No Parasternal Heave +ve B.Sounds, Abd Soft, No tenderness, No organomegaly appriciated, No rebound - guarding or rigidity. No Cyanosis, Clubbing or edema, No new Rash or bruise     Data Reviewed: I have personally reviewed following labs and imaging studies:  Recent Labs  Lab 08/23/2021 1039 08/11/21 0420 08/12/21 0055 08/13/21 0124 08/14/21 0716 08/15/21 0055  WBC 6.4 6.1 8.2 9.2 10.3 11.2*  HGB 12.1 10.5* 11.7* 13.4 11.5* 11.6*  HCT 36.5 32.2* 36.9 40.7 35.2* 35.3*  PLT 402* 313 363 442* 381 391  MCV 85.3 85.0 85.6 84.6 84.6 84.0  MCH 28.3 27.7 27.1 27.9 27.6 27.6  MCHC 33.2 32.6 31.7 32.9 32.7 32.9  RDW 15.5 15.6* 15.7* 15.9* 16.1* 16.3*  LYMPHSABS 0.9  --   --   --   --  0.6*  MONOABS 0.5  --   --   --   --  0.6  EOSABS 0.2  --   --   --   --  0.2  BASOSABS 0.0  --   --   --   --  0.0    Recent Labs  Lab 08/27/2021 1039 08/11/21 0420 08/11/21 0706 08/12/21 0055 08/13/21 0124 08/14/21 0138 08/14/21 0716 08/15/21 0055 08/15/21 0816  NA 130* 132*  --   135 134* 135  --  137  --   K 4.0 2.7* 3.0* 3.4* 4.4 3.9  --  3.9  --   CL 100 103  --  106 105 107  --  109  --   CO2 19* 20*  --  20* 18* 18*  --  19*  --   GLUCOSE 103* 101*  --  97 128* 114*  --  101*  --   BUN 12 11  --  11 20 32*  --  26*  --   CREATININE 0.81 0.77  --  0.63 0.77 0.91  --  0.77  --   CALCIUM 8.5* 8.0*  --  8.6* 8.7* 8.6*  --  8.2*  --   AST 26  --   --   --   --   --   --  24  --   ALT 12  --   --   --   --   --   --  11  --   ALKPHOS 66  --   --   --   --   --   --  41  --   BILITOT 0.8  --   --   --   --   --   --  0.6  --   ALBUMIN 2.9*  --   --   --   --   --   --  2.0*  --   MG  --   --  1.8  --   --   --   --  1.9  --   CRP  --   --   --   --   --  8.6*  --  12.0*  --   DDIMER  --   --   --   --   --   --   --   --  3.22*  PROCALCITON  --   --   --   --   --   --  0.21 0.21  --   LATICACIDVEN 1.7  --   --   --   --   --   --   --   --  INR 1.0  --   --   --   --  1.8*  --   --   --          Radiology Studies: CT HEAD WO CONTRAST (5MM)  Result Date: 08/15/2021 CLINICAL DATA:  Neuro deficit, stroke suspected right-sided weakness EXAM: CT HEAD WITHOUT CONTRAST TECHNIQUE: Contiguous axial images were obtained from the base of the skull through the vertex without intravenous contrast. COMPARISON:  08/14/2021 FINDINGS: Brain: No significant change from prior exam. Redemonstrated right basal ganglia and left thalamic infarcts. Periventricular white matter changes, likely the sequela of chronic small vessel ischemic disease. No acute cortical infarct, hemorrhage, mass, mass effect, or midline shift. No hydrocephalus. Vascular: No hyperdense vessel Skull: Normal. Negative for fracture or focal lesion. Sinuses/Orbits: No acute finding. Other: The mastoids are well aerated. IMPRESSION: No acute intracranial process. No significant change from the prior exam. Electronically Signed   By: Merilyn Baba M.D.   On: 08/15/2021 03:09   DG Chest Port 1 View  Result Date:  08/15/2021 CLINICAL DATA:  Retrocardiac density. EXAM: PORTABLE CHEST 1 VIEW COMPARISON:  08/14/2021 FINDINGS: Right lung clear. Persistent retrocardiac streaky opacity, likely atelectasis or scar. The cardiopericardial silhouette is within normal limits for size. The visualized bony structures of the thorax show no acute abnormality. IMPRESSION: Persistent retrocardiac streaky opacity, likely atelectasis or scar. This appears to be new since CT scan of 05/25/2021. Follow-up chest x-ray could be used to ensure resolution. Electronically Signed   By: Misty Stanley M.D.   On: 08/15/2021 08:07   DG Chest Port 1 View  Result Date: 08/14/2021 CLINICAL DATA:  Shortness of breath EXAM: PORTABLE CHEST 1 VIEW COMPARISON:  08/03/2021 FINDINGS: Hazy and streaky retrocardiac opacity. Aortic tortuosity. Normal heart size. Implantable loop recorder. The lungs are clear of edema. No pleural fluid or pneumothorax. IMPRESSION: Retrocardiac opacity with shape favoring atelectasis. Electronically Signed   By: Jorje Guild M.D.   On: 08/14/2021 09:14   VAS [place] TRANSCRANIAL DOPPLER W BUBBLES  Result Date: 08/13/2021  Transcranial Doppler with Bubble Patient Name:  Michaela Rios [facility]  Date of Exam:   08/13/2021 Medical Rec #: [id number]          Accession #:    [id number] Date of Birth: 1951/03/31          Patient Gender: F Patient Age:   47 years Exam Location:  [facility] Procedure:      VAS [place] TRANSCRANIAL DOPPLER W BUBBLES Referring Phys: Lelan Pons FRANCOIS --------------------------------------------------------------------------------  Indications: Stroke. Comparison Study: No prior studies. Performing Technologist: Oliver Hum RVT  Examination Guidelines: A complete evaluation includes B-mode imaging, spectral Doppler, color Doppler, and power Doppler as needed of all accessible portions of each vessel. Bilateral testing is considered an integral part of a complete examination. Limited examinations for reoccurring  indications may be performed as noted.  Summary:  A vascular evaluation was performed. The right middle cerebral artery was studied. An IV was inserted into the patient's right Cephalic vein.. Verbal informed consent was obtained.  A near curtain effect was noted with and without valsalva. Moderate to large, Spencer grade 5 PFO. *See table(s) above for TCD measurements and observations.    Preliminary    VAS [place] LOWER EXTREMITY VENOUS (DVT)  Result Date: 08/13/2021  Lower Venous DVT Study Patient Name:  Michaela Rios [facility]  Date of Exam:   08/13/2021 Medical Rec #: [id number]          Accession #:  [id number] Date of Birth: Aug 11, 1951          Patient Gender: F Patient Age:   49 years Exam Location:  [facility] Procedure:      VAS [place] LOWER EXTREMITY VENOUS (DVT) Referring Phys: Lelan Pons FRANCOIS --------------------------------------------------------------------------------  Indications: Stroke. Other Indications: PFO. Limitations: Poor ultrasound/tissue interface and patient positioning, patient immobility. Comparison Study: No prior studies. Performing Technologist: Oliver Hum RVT  Examination Guidelines: A complete evaluation includes B-mode imaging, spectral Doppler, color Doppler, and power Doppler as needed of all accessible portions of each vessel. Bilateral testing is considered an integral part of a complete examination. Limited examinations for reoccurring indications may be performed as noted. The reflux portion of the exam is performed with the patient in reverse Trendelenburg.  +---------+---------------+---------+-----------+----------+--------------+ RIGHT    CompressibilityPhasicitySpontaneityPropertiesThrombus Aging +---------+---------------+---------+-----------+----------+--------------+ CFV      Full           Yes      Yes                                 +---------+---------------+---------+-----------+----------+--------------+ SFJ      Full                                                         +---------+---------------+---------+-----------+----------+--------------+ FV Prox  Full                                                        +---------+---------------+---------+-----------+----------+--------------+ FV Mid   Full                                                        +---------+---------------+---------+-----------+----------+--------------+ FV DistalFull                                                        +---------+---------------+---------+-----------+----------+--------------+ PFV      Full                                                        +---------+---------------+---------+-----------+----------+--------------+ POP      Full           Yes      Yes                                 +---------+---------------+---------+-----------+----------+--------------+ PTV      Full                                                        +---------+---------------+---------+-----------+----------+--------------+  PERO     Full                                                        +---------+---------------+---------+-----------+----------+--------------+   +---------+---------------+---------+-----------+----------+--------------+ LEFT     CompressibilityPhasicitySpontaneityPropertiesThrombus Aging +---------+---------------+---------+-----------+----------+--------------+ CFV      Full           Yes      Yes                                 +---------+---------------+---------+-----------+----------+--------------+ SFJ      Full                                                        +---------+---------------+---------+-----------+----------+--------------+ FV Prox  Full                                                        +---------+---------------+---------+-----------+----------+--------------+ FV Mid   Full                                                         +---------+---------------+---------+-----------+----------+--------------+ FV Distal               Yes      Yes                                 +---------+---------------+---------+-----------+----------+--------------+ PFV      Full                                                        +---------+---------------+---------+-----------+----------+--------------+ POP      Full           Yes      Yes                                 +---------+---------------+---------+-----------+----------+--------------+ PTV      Full                                                        +---------+---------------+---------+-----------+----------+--------------+ PERO     Full                                                        +---------+---------------+---------+-----------+----------+--------------+  Summary: RIGHT: - There is no evidence of deep vein thrombosis in the lower extremity. However, portions of this examination were limited- see technologist comments above.  - No cystic structure found in the popliteal fossa.  LEFT: - There is no evidence of deep vein thrombosis in the lower extremity. However, portions of this examination were limited- see technologist comments above.  - No cystic structure found in the popliteal fossa.  *See table(s) above for measurements and observations.    Preliminary    CT HEAD WO CONTRAST (CHARM STUDY)  Result Date: 08/14/2021 CLINICAL DATA:  Mild neurologic decline EXAM: CT HEAD WITHOUT CONTRAST TECHNIQUE: Contiguous axial images were obtained from the base of the skull through the vertex without intravenous contrast. COMPARISON:  CTA head 05/26/2021, MRI brain 08/01/2021. 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 Periventricular white matter changes, likely the sequela of chronic small vessel ischemic disease. No acute infarct, hemorrhage, mass, mass effect, or midline shift. No definite hydrocephalus.  Vascular: No hyperdense vessel or unexpected calcification. Skull: Normal. Negative for fracture or focal lesion. Sinuses/Orbits: No acute finding. Other: The mastoids are well aerated. IMPRESSION: No acute intracranial process. Electronically Signed   By: Merilyn Baba M.D.   On: 08/14/2021 01:04     Scheduled Meds:  amLODipine  5 mg Oral Daily   aspirin EC  81 mg Oral Daily   atorvastatin  40 mg Oral QHS   buPROPion  150 mg Oral Daily   clopidogrel  75 mg Oral Daily   enoxaparin (LOVENOX) injection  40 mg Subcutaneous Q24H   metoprolol succinate  100 mg Oral Daily   sodium chloride flush  3 mL Intravenous Q12H   topiramate  100 mg Oral BID   venlafaxine XR  75 mg Oral Daily   cyanocobalamin  1,000 mcg Oral Daily   Continuous Infusions:  cefTRIAXone (ROCEPHIN)  IV 1 g (08/14/21 1449)   lactated ringers 50 mL/hr at 08/15/21 0946   metronidazole 500 mg (08/15/21 0243)     LOS: 5 days    Time spent: 35 minutes  Signature  Lala Lund M.D on 08/15/2021 at 11:39 AM   -  To page go to [email or web address]

## 2021-08-16 DIAGNOSIS — I639 Cerebral infarction, unspecified: Secondary | ICD-10-CM | POA: Diagnosis not present

## 2021-08-16 DIAGNOSIS — R509 Fever, unspecified: Secondary | ICD-10-CM | POA: Diagnosis not present

## 2021-08-16 LAB — LACTIC ACID, PLASMA
Lactic Acid, Venous: 1.6 mmol/L (ref 0.5–1.9)
Lactic Acid, Venous: 2 mmol/L (ref 0.5–1.9)

## 2021-08-16 LAB — COMPREHENSIVE METABOLIC PANEL
ALT: 11 U/L (ref 0–44)
AST: 26 U/L (ref 15–41)
Albumin: 1.9 g/dL — ABNORMAL LOW (ref 3.5–5.0)
Alkaline Phosphatase: 41 U/L (ref 38–126)
Anion gap: 9 (ref 5–15)
BUN: 20 mg/dL (ref 8–23)
CO2: 19 mmol/L — ABNORMAL LOW (ref 22–32)
Calcium: 8.2 mg/dL — ABNORMAL LOW (ref 8.9–10.3)
Chloride: 112 mmol/L — ABNORMAL HIGH (ref 98–111)
Creatinine, Ser: 0.69 mg/dL (ref 0.44–1.00)
GFR, Estimated: >60 mL/min (ref >60)
Glucose, Bld: 109 mg/dL — ABNORMAL HIGH (ref 70–99)
Potassium: 3.8 mmol/L (ref 3.5–5.1)
Sodium: 140 mmol/L (ref 135–145)
Total Bilirubin: 0.5 mg/dL (ref 0.3–1.2)
Total Protein: 5.2 g/dL — ABNORMAL LOW (ref 6.5–8.1)

## 2021-08-16 LAB — CBC WITH DIFFERENTIAL/PLATELET
Abs Immature Granulocytes: 0.1 10*3/uL — ABNORMAL HIGH (ref 0.00–0.07)
Basophils Absolute: 0 10*3/uL (ref 0.0–0.1)
Basophils Relative: 0 %
Eosinophils Absolute: 0.3 10*3/uL (ref 0.0–0.5)
Eosinophils Relative: 3 %
HCT: 38 % (ref 36.0–46.0)
Hemoglobin: 12.2 g/dL (ref 12.0–15.0)
Immature Granulocytes: 1 %
Lymphocytes Relative: 7 %
Lymphs Abs: 0.7 10*3/uL (ref 0.7–4.0)
MCH: 26.9 pg (ref 26.0–34.0)
MCHC: 32.1 g/dL (ref 30.0–36.0)
MCV: 83.7 fL (ref 80.0–100.0)
Monocytes Absolute: 0.4 10*3/uL (ref 0.1–1.0)
Monocytes Relative: 4 %
Neutro Abs: 8.8 10*3/uL — ABNORMAL HIGH (ref 1.7–7.7)
Neutrophils Relative %: 85 %
Platelets: 413 10*3/uL — ABNORMAL HIGH (ref 150–400)
RBC: 4.54 MIL/uL (ref 3.87–5.11)
RDW: 16.2 % — ABNORMAL HIGH (ref 11.5–15.5)
WBC: 10.3 10*3/uL (ref 4.0–10.5)
nRBC: 0 % (ref 0.0–0.2)

## 2021-08-16 LAB — PROCALCITONIN: Procalcitonin: 0.24 ng/mL

## 2021-08-16 LAB — C-REACTIVE PROTEIN: CRP: 10.9 mg/dL — ABNORMAL HIGH (ref <1.0)

## 2021-08-16 LAB — D-DIMER, QUANTITATIVE: D-Dimer, Quant: 3.67 ug/mL-FEU — ABNORMAL HIGH (ref 0.00–0.50)

## 2021-08-16 LAB — MAGNESIUM: Magnesium: 1.7 mg/dL (ref 1.7–2.4)

## 2021-08-16 MED ORDER — LACTATED RINGERS IV SOLN: INTRAVENOUS | Status: AC

## 2021-08-16 MED ORDER — LACTULOSE 10 GM/15ML PO SOLN
30.0000 g | Freq: Three times a day (TID) | ORAL | Status: AC
  Administered 2021-08-16: 30 g via ORAL
  Filled 2021-08-16 (×2): qty 45

## 2021-08-16 MED ORDER — METOPROLOL SUCCINATE ER 50 MG PO TB24: 50.0000 mg | ORAL_TABLET | Freq: Every day | ORAL | Status: DC

## 2021-08-16 MED ORDER — METOPROLOL SUCCINATE ER 50 MG PO TB24
50.0000 mg | ORAL_TABLET | Freq: Every day | ORAL | Status: DC
  Administered 2021-08-16 – 2021-08-18 (×3): 50 mg via ORAL
  Filled 2021-08-16 (×3): qty 1

## 2021-08-16 MED ORDER — VENLAFAXINE HCL ER 75 MG PO CP24
75.0000 mg | ORAL_CAPSULE | Freq: Once | ORAL | Status: AC
  Administered 2021-08-16: 75 mg via ORAL
  Filled 2021-08-16: qty 1

## 2021-08-16 MED ORDER — VENLAFAXINE HCL ER 37.5 MG PO CP24
37.5000 mg | ORAL_CAPSULE | Freq: Every day | ORAL | Status: AC
  Administered 2021-08-17 – 2021-08-18 (×2): 37.5 mg via ORAL
  Filled 2021-08-16 (×2): qty 1

## 2021-08-16 MED ORDER — LACTATED RINGERS IV BOLUS
1000.0000 mL | Freq: Once | INTRAVENOUS | Status: AC
  Administered 2021-08-16: 1000 mL via INTRAVENOUS

## 2021-08-16 MED ORDER — DOCUSATE SODIUM 100 MG PO CAPS
100.0000 mg | ORAL_CAPSULE | Freq: Two times a day (BID) | ORAL | Status: DC
  Administered 2021-08-16 – 2021-08-17 (×4): 100 mg via ORAL
  Filled 2021-08-16 (×5): qty 1

## 2021-08-16 MED ORDER — POLYETHYLENE GLYCOL 3350 17 G PO PACK
17.0000 g | PACK | Freq: Two times a day (BID) | ORAL | Status: DC
  Administered 2021-08-16 – 2021-08-17 (×2): 17 g via ORAL
  Filled 2021-08-16 (×3): qty 1

## 2021-08-16 MED ORDER — BISACODYL 10 MG RE SUPP
10.0000 mg | Freq: Once | RECTAL | Status: AC
  Administered 2021-08-16: 10 mg via RECTAL
  Filled 2021-08-16: qty 1

## 2021-08-16 NOTE — Progress Notes (Signed)
Pt febrile 101.8 rectal and tachycardia 127.  Dr. Hal Hope notified. Pt remains stable, no change from baseline status. At this time awaiting lactic acid results. Will continue to monitor using MEWS. Giving bedside report to oncoming nurse Valentino Nose, RN.

## 2021-08-16 NOTE — Progress Notes (Signed)
   08/16/21 0400  Assess: MEWS Score  Temp (!) 100.9 F (38.3 C)  BP 126/88  Pulse Rate (!) 131  ECG Heart Rate (!) 131  Resp (!) 25  Level of Consciousness Alert  SpO2 95 %  O2 Device Room Air  Patient Activity (if Appropriate) In bed  Assess: MEWS Score  MEWS Temp 1  MEWS Systolic 0  MEWS Pulse 3  MEWS RR 1  MEWS LOC 0  MEWS Score 5  MEWS Score Color Red  Assess: if the MEWS score is Yellow or Red  Were vital signs taken at a resting state? Yes  Focused Assessment No change from prior assessment  Early Detection of Sepsis Score *See Row Information* High  MEWS guidelines implemented *See Row Information* Yes  Treat  MEWS Interventions Administered prn meds/treatments;Other (Comment) (MD contacted)  Pain Scale 0-10  Pain Score 0  Take Vital Signs  Increase Vital Sign Frequency  Red: Q 1hr X 4 then Q 4hr X 4, if remains red, continue Q 4hrs  Escalate  MEWS: Escalate Red: discuss with charge nurse/RN and provider, consider discussing with RRT  Notify: Charge Nurse/RN  Name of Charge Nurse/RN Notified Matt, RN  Date Charge Nurse/RN Notified 08/16/21  Time Charge Nurse/RN Notified 0400  Notify: Provider  Provider Name/Title Hal Hope, MD  Date Provider Notified 08/16/21  Time Provider Notified 0430  Notification Type Call  Notification Reason  (red MEWS, no change in status from baseline)  Provider response No new orders  Date of Provider Response 08/16/21  Time of Provider Response 0430

## 2021-08-16 NOTE — Progress Notes (Signed)
Speech Language Pathology Treatment: Dysphagia  Patient Details Name: Michaela Rios MRN: [id number] DOB: 08-Jul-1951 Today's Date: 08/16/2021 Time: 3570-1779 SLP Time Calculation (min) (ACUTE ONLY): 15 min  Assessment / Plan / Recommendation Clinical Impression  Pt seen for dysphagia treatment.  Her RN reports poor PO intake.  Provided today with saltines, water to determine potential to advance diet back to regular.  Pt continues to demonstrate impaired initiation and ability to persist through task of eating. Approach to mouth is slow, with cues needed to continue effort.  Mastication is c/b prolonged chewing then ceased activity, again requiring max cues to continue process.  Initiation and attention are the primary barrier.  Recommend continuing dysphagia 2 for now. Pt needs 1:1 help with meals for ongoing encouragement. SLP will follow.   HPI HPI: Michaela Rios is a 70 y.o. female with recent stroke discharged 06/04/2021. After rehabilitation her husband said that there was incremental improvement. Last Thursday (08/05/2021) during therapy she was able to walk as well as at home however, the ensuing days she progressively developed generalized weakness to the point that she could not even sit up straight. Difficulty swallowing also reported. Dx acute metabolic encephalopathy,  sepsis, unclear etiology.  MRI brain 9/4 showed new restricted diffusion consistent with new strokes in left BG. Neurology felt these are statistically more likely to be related to risk factors of hypertension, hyperlipidemia. During prior admission swallow function was evaluated 6/30 with f/u on 7/08 revealing swallow WFL and tolerance of regular diet and thin liquids with no f/u needed for swallowing. 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      SLP Plan  Continue with current plan of care      Recommendations for follow up therapy are one component of a multi-disciplinary discharge planning  process, led by the attending physician.  Recommendations may be updated based on patient status, additional functional criteria and insurance authorization.    Recommendations  Diet recommendations: Dysphagia 2 (fine chop);Thin liquid Liquids provided via: Cup;Straw Medication Administration: Whole meds with puree Supervision: Staff to assist with self feeding Compensations: Minimize environmental distractions Postural Changes and/or Swallow Maneuvers: Seated upright 90 degrees                Oral Care Recommendations: Oral care BID Follow up Recommendations: 24 hour supervision/assistance SLP Visit Diagnosis: Dysphagia, oral phase (R13.11) Plan: Continue with current plan of care       Godson Pollan L. Tivis Ringer, [place] Office number [phone] Pager [phone]                 Juan Quam Laurice  08/16/2021, 11:42 AM

## 2021-08-16 NOTE — Progress Notes (Addendum)
Inpatient Rehab Admissions Coordinator:   Pt. Continues to require max-total A with PT/OT recommending SNF. I agree that Pt. Does not appear to be able to tolerate intensity of CIR at this time. CIR will sign off at this time.  I have notified pt.'s husband and TOC.   Clemens Catholic, [place], [place] Admissions Coordinator  [phone] ([place]) [phone] (office)

## 2021-08-16 NOTE — Plan of Care (Signed)
Pt alert and oriented during am shift, denies pain or discomfort, room air, pt has refused all meals for staff and spouse at bedside, iv infusion increased lr'@100'$ , multiple stool softeners and laxatives provided, multiple stools, noted skin becoming irritated, rectal pouch placed for additional stool, in speaking with spouse lactulose held at this time will received pm dose, iv site changed due to accidental removal, pt remained afebrile during shift with tachycardia noted, new med orders, call bell within reach, bed in lowest and locked position, sbar to be provided to on coming shift.   Problem: Education: Goal: Knowledge of disease or condition will improve Outcome: Progressing Goal: Knowledge of secondary prevention will improve Outcome: Progressing Goal: Knowledge of patient specific risk factors addressed and post discharge goals established will improve Outcome: Progressing   Problem: Self-Care: Goal: Ability to participate in self-care as condition permits will improve Outcome: Progressing Goal: Verbalization of feelings and concerns over difficulty with self-care will improve Outcome: Progressing   Problem: Clinical Measurements: Goal: Ability to maintain clinical measurements within normal limits will improve Outcome: Progressing Goal: Will remain free from infection Outcome: Progressing   Problem: Activity: Goal: Risk for activity intolerance will decrease Outcome: Progressing

## 2021-08-16 NOTE — Progress Notes (Signed)
[facility] PROGRESS NOTE    Michaela Rios  [id number] DOB: Jul 26, 1951 DOA: 08/09/2021 PCP: Binnie Rail, MD      Brief Narrative:  Michaela Rios is a 70 y.o. F with HTN, CKD IIIa, and PVD as well as recent admission for stroke and FUO presented again with fever and weakness.    History of current illness begins late June (prior to which patient was independent, no dementia, lived with husband, normal with ADLs):  6/27: Initially presented w/ 1 week urinary symptoms, generalized weakness -> temp 101F, HR 140s in the ER, started on treatment for sepsis 6/29: Still encephalopathic out of proportion to expected for sepsis, CT head obtained, showed abnormal signal in BGs -> f/u MRI showed acute right BG stroke, acute left BG/thalamus/caudate T2 hyperintensity, neuro consulted, no focal weakness at that time 6/30: first mention of left sided weakness on exam; LP this date, culture negative, no WBC, Crypto neg, HSV PCR neg, protein elevated and 22 RBCs in tubes 1 and 4, not enough fluid for flow cytometry 7/2: Recurrent fever, ID consulted for FUO  7/2-7/5: Work up for systemic vasculitis included ANCA, complements, ANA, anti-dsDNA, Ro/La Abs, RF.  All negative except ANA+, CRP ~7.  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  The patient's fever resolved with stopping antibiotics.  She was treated with Solu-medrol 1g for 3 days.    7/5: Cerebral angiography was unremarkable.  7/12: She was discharged to CIR, where she progressed to the point of being interactive, ambulatory with a walker.   8/4: Returned home from [place] where she remained ambulatory, interactive for 1-2 weeks, then started to get worse.  9/4: MRI brain w/wo contrast on 9/4 showed evolution of right BG infarct, no change in L BG T2 hyperintensity, and  new acute/subacute infarcts in the left BG.  Around this time, the patient had worsening generalized weakness, worse PO intake, more somnolence and psychomotor slowing, and husband brought her back to the ER.    On return to the ER this admission, she was febrile, tachycardic, but WBC normal, lactate normal, CXR normal.  UA with bacteria, started on broad spectrum antibiotics and admitted.    Subjective:  Patient in bed, appears comfortable, denies any headache, no fever, no chest pain or pressure, no shortness of breath , no abdominal pain. No new focal weakness.    Assessment & Plan:  Fever - off and on could be neurological,  but LLL Infilterate on 08/14/21, high risk for aspiration, repeat BC, UA-UC, Procal and WBC trend, Rocephin + Flagyl for now.  Her D-dimer was elevated hence we checked both upper and lower extremity duplex which was negative, CT scan abdomen pelvis with unremarkable except for stool burden.  Plan is for aspiration hence ABX total of 5 days.  We will request ID evaluation, this could very well be central fevers from CVA.  Acute metabolic encephalopathy - Could be due to CVA + PNA, TSH normal, B12 normal.  She was on 2 central acting agents venlafaxine for anxiety and Topamax for migraines, Reduced Topamax, Taper and stop venlafaxine.  Subacute strokes - Left caudate and putamen infarct embolic secondary to likely small vessel disease .  Prior history of multiple cryptogenic strokes in July 2022 with negative extensive work-up she does have a PFO but at her age she has a low  rope score of 2 making PFO unlikely cause of her strokes.  Seen by CVA team, ILR interrogated, this captured no events, Afib unlikely.  (The tachycardia noted on the report is consistent with her observed sinus tachycardia here), per CVA team -  Aspirin and Plavix for 3 weeks then Plavix alone, Continue new atorvastatin, TCD confirms PFO, Leg [place] -ve for DVT.  Torticollis - Low clinical suspicion for  meningitis, this is more a left sided neck spasm, present for the last >1 week per husband. MRI brain from 9/4 showed no abnormalities in the upper cervical spine. Also, she is on no dopamine antagonists, I do not suspect a medication related dystonia.  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   Sinus tachycardia  She remains tachycardic, not eating at all.  Started IVF, stable TSH.  Hypertension  - BP normal, Continue amlodipine, metoprolol   CKD IIIa - stable.  Chronic migraines - No headaches,  reduced Topamax  Anxiety - Taper and stop venlafaxine, Reduced bupropion   Hyponatremia Mild asymptomatic  Hypokalemia Supplemented and ersovled  Anemia, normocytic Mild, asymptomatic  Lung nodule 29m incidental nodule noted last admission.  Outpt Pulm follow up.  Severe stool burden with constipation.  Placed on Bowel regimen.    PFO.  Cardiology outpatient cardiology follow-up for elective closure if family desires.    Disposition: Status is: Inpatient  Remains inpatient appropriate because:IV treatments appropriate due to intensity of illness or inability to take PO  Dispo: The patient is from: Home              Anticipated d/c is to: SNF              Patient currently is not medically stable to d/c.   Difficult to place patient No  Level of care: Tele   MDM: The below labs and imaging reports were reviewed and summarized above.  Medication management as above.    DVT prophylaxis: enoxaparin (LOVENOX) injection 40 mg Start: 08/14/2021 1600  Code Status: FULL  Family Communication:  Husband KLevada Dy- [phone]- on 08/15/21    Consultants:   ID Neurology  Procedures:   9/13 CXR clear 9/14 Lower extremity [place]DVT -- no DVT 9/14 transcranial doppler with bubbles -- TCD bubble study positive for large right-to-left shunt +ve grade 5 PFO - DW Dr XErlinda Hong9/17/22 CT head 9/17 - non  acute [place]upper - CT abd Pelvis -   Antimicrobials:    Vancomycin, flagyl x1 on 9/13 Aztreonam 9/13 >> 9/14 Rocephin    9/15 >> 9/16 Unasyn 9/17  Culture data:    9/13 blood culture -- ngtd 9/13 urine culture -- <30K colonies klebnsiella   Objective: Vitals:   08/16/21 0600 08/16/21 0701 08/16/21 0802 08/16/21 0901  BP:  1'00/86 96/81 95/74 '$  Pulse:  (!) 127 (!) 114 (!) 108  Resp:  (!) '22 17 20  '$ Temp: (!) 102.1 F (38.9 C) (!) 101.8 F (38.8 C) 98.9 F (37.2 C) 99 F (37.2 C)  TempSrc:  Rectal Axillary Axillary  SpO2:  97% 96% 96%  Weight:      Height:        Intake/Output Summary (Last 24 hours) at 08/16/2021 1104 Last data filed at 08/15/2021 1501 Gross per 24 hour  Intake 100.16 ml  Output --  Net 100.16 ml   Filed Weights   08/11/21 2312 08/14/21 0400 08/15/21 0455  Weight: 54.5  kg 55.1 kg 57 kg    Examination:  Awake Alert, neck is tilted to left, strength 4/5 in all extremities except for the left leg which is 3/5. Jenkins.AT,PERRAL Supple Neck,No JVD, No cervical lymphadenopathy appriciated.  Symmetrical Chest wall movement, Good air movement bilaterally, CTAB RRR,No Gallops, Rubs or new Murmurs, No Parasternal Heave +ve B.Sounds, Abd Soft, No tenderness, No organomegaly appriciated, No rebound - guarding or rigidity. No Cyanosis, Clubbing or edema, No new Rash or bruise   Data Reviewed: I have personally reviewed following labs and imaging studies:  Recent Labs  Lab 08/09/2021 1039 08/11/21 0420 08/12/21 0055 08/13/21 0124 08/14/21 0716 08/15/21 0055 08/16/21 0249  WBC 6.4   < > 8.2 9.2 10.3 11.2* 10.3  HGB 12.1   < > 11.7* 13.4 11.5* 11.6* 12.2  HCT 36.5   < > 36.9 40.7 35.2* 35.3* 38.0  PLT 402*   < > 363 442* 381 391 413*  MCV 85.3   < > 85.6 84.6 84.6 84.0 83.7  MCH 28.3   < > 27.1 27.9 27.6 27.6 26.9  MCHC 33.2   < > 31.7 32.9 32.7 32.9 32.1  RDW 15.5   < > 15.7* 15.9* 16.1* 16.3* 16.2*  LYMPHSABS 0.9  --   --   --   --  0.6* 0.7  MONOABS  0.5  --   --   --   --  0.6 0.4  EOSABS 0.2  --   --   --   --  0.2 0.3  BASOSABS 0.0  --   --   --   --  0.0 0.0   < > = values in this interval not displayed.    Recent Labs  Lab 08/13/2021 1039 08/11/21 0420 08/11/21 0706 08/12/21 0055 08/13/21 0124 08/14/21 0138 08/14/21 0716 08/15/21 0055 08/15/21 0816 08/16/21 0249 08/16/21 0713  NA 130*   < >  --  135 134* 135  --  137  --  140  --   K 4.0   < > 3.0* 3.4* 4.4 3.9  --  3.9  --  3.8  --   CL 100   < >  --  106 105 107  --  109  --  112*  --   CO2 19*   < >  --  20* 18* 18*  --  19*  --  19*  --   GLUCOSE 103*   < >  --  97 128* 114*  --  101*  --  109*  --   BUN 12   < >  --  11 20 32*  --  26*  --  20  --   CREATININE 0.81   < >  --  0.63 0.77 0.91  --  0.77  --  0.69  --   CALCIUM 8.5*   < >  --  8.6* 8.7* 8.6*  --  8.2*  --  8.2*  --   AST 26  --   --   --   --   --   --  24  --  26  --   ALT 12  --   --   --   --   --   --  11  --  11  --   ALKPHOS 66  --   --   --   --   --   --  41  --  41  --   BILITOT 0.8  --   --   --   --   --   --  0.6  --  0.5  --   ALBUMIN 2.9*  --   --   --   --   --   --  2.0*  --  1.9*  --   MG  --   --  1.8  --   --   --   --  1.9  --  1.7  --   CRP  --   --   --   --   --  8.6*  --  12.0*  --  10.9*  --   DDIMER  --   --   --   --   --   --   --   --  3.22* 3.67*  --   PROCALCITON  --   --   --   --   --   --  0.21 0.21  --  0.24  --   LATICACIDVEN 1.7  --   --   --   --   --   --   --   --   --  1.6  INR 1.0  --   --   --   --  1.8*  --   --   --   --   --    < > = values in this interval not displayed.         Radiology Studies: CT HEAD WO CONTRAST (5MM)  Result Date: 08/15/2021 CLINICAL DATA:  Neuro deficit, stroke suspected right-sided weakness EXAM: CT HEAD WITHOUT CONTRAST TECHNIQUE: Contiguous axial images were obtained from the base of the skull through the vertex without intravenous contrast. COMPARISON:  08/14/2021 FINDINGS: Brain: No significant change from prior exam.  Redemonstrated right basal ganglia and left thalamic infarcts. Periventricular white matter changes, likely the sequela of chronic small vessel ischemic disease. No acute cortical infarct, hemorrhage, mass, mass effect, or midline shift. No hydrocephalus. Vascular: No hyperdense vessel Skull: Normal. Negative for fracture or focal lesion. Sinuses/Orbits: No acute finding. Other: The mastoids are well aerated. IMPRESSION: No acute intracranial process. No significant change from the prior exam. Electronically Signed   By: Merilyn Baba M.D.   On: 08/15/2021 03:09   CT ABDOMEN PELVIS W CONTRAST  Result Date: 08/15/2021 CLINICAL DATA:  Fever, abdominal pain EXAM: CT ABDOMEN AND PELVIS WITH CONTRAST TECHNIQUE: Multidetector CT imaging of the abdomen and pelvis was performed using the standard protocol following bolus administration of intravenous contrast. CONTRAST:  125m OMNIPAQUE IOHEXOL 300 MG/ML  SOLN COMPARISON:  06/24/2021 FINDINGS: Lower chest: No acute abnormality. Unchanged elevation of the left hemidiaphragm. Hepatobiliary: No solid liver abnormality is seen. No gallstones, gallbladder wall thickening, or biliary dilatation. Pancreas: Unremarkable. No pancreatic ductal dilatation or surrounding inflammatory changes. Spleen: Normal in size without significant abnormality. Adrenals/Urinary Tract: Adrenal glands are unremarkable. Kidneys are normal, without renal calculi, solid lesion, or hydronephrosis. Heterogeneous material within the dependent left urinary bladder, dependently hyperdense (series 3, image 73). Stomach/Bowel: Stomach is within normal limits. Appendix appears normal. No evidence of bowel wall thickening, distention, or inflammatory changes. Large burden of stool throughout the distal colon. Vascular/Lymphatic: Aortic atherosclerosis. No enlarged abdominal or pelvic lymph nodes. Reproductive: Status post hysterectomy. Other: No abdominal wall hernia. Anasarca. No abdominopelvic ascites.  Musculoskeletal: No acute or significant osseous findings. IMPRESSION: 1. Heterogeneous material within the dependent left urinary bladder, dependently hyperdense. This may reflect some combination of excreted contrast with mixing artifact, clot, or small bladder stones/calcific debris. Endoluminal bladder mass is difficult to strictly exclude. Recommend  delayed phase CT urogram to further evaluate. 2. Large burden of stool throughout the distal colon. 3. Anasarca. Aortic Atherosclerosis (ICD10-I70.0). Electronically Signed   By: Eddie Candle M.D.   On: 08/15/2021 12:50   DG Chest Port 1 View  Result Date: 08/15/2021 CLINICAL DATA:  Retrocardiac density. EXAM: PORTABLE CHEST 1 VIEW COMPARISON:  08/14/2021 FINDINGS: Right lung clear. Persistent retrocardiac streaky opacity, likely atelectasis or scar. The cardiopericardial silhouette is within normal limits for size. The visualized bony structures of the thorax show no acute abnormality. IMPRESSION: Persistent retrocardiac streaky opacity, likely atelectasis or scar. This appears to be new since CT scan of 05/25/2021. Follow-up chest x-ray could be used to ensure resolution. Electronically Signed   By: Misty Stanley M.D.   On: 08/15/2021 08:07   VAS [place] UPPER EXTREMITY VENOUS DUPLEX  Result Date: 08/15/2021 UPPER VENOUS STUDY  Patient Name:  JARDYN EALY [facility]  Date of Exam:   08/15/2021 Medical Rec #: [id number]          Accession #:    [id number] Date of Birth: 1951-09-10          Patient Gender: F Patient Age:   19 years Exam Location:  [facility] Procedure:      VAS [place] UPPER EXTREMITY VENOUS DUPLEX Referring Phys: Deno Etienne [facility] --------------------------------------------------------------------------------  Indications: Fever of unknown origin Limitations: Inability to keep arm extended. Comparison Study: No prior study Performing Technologist: Sharion Dove RVS  Examination Guidelines: A complete evaluation includes B-mode imaging, spectral  Doppler, color Doppler, and power Doppler as needed of all accessible portions of each vessel. Bilateral testing is considered an integral part of a complete examination. Limited examinations for reoccurring indications may be performed as noted.  Right Findings: +----------+------------+---------+-----------+----------+---------------+ RIGHT     CompressiblePhasicitySpontaneousProperties    Summary     +----------+------------+---------+-----------+----------+---------------+ IJV           Full       Yes       Yes                              +----------+------------+---------+-----------+----------+---------------+ Subclavian               Yes       Yes                              +----------+------------+---------+-----------+----------+---------------+ Axillary      Full       Yes       Yes                              +----------+------------+---------+-----------+----------+---------------+ Brachial      Full       Yes       Yes                              +----------+------------+---------+-----------+----------+---------------+ Radial                                              patent by color +----------+------------+---------+-----------+----------+---------------+ Ulnar  Not visualized  +----------+------------+---------+-----------+----------+---------------+ Cephalic      Full                                                  +----------+------------+---------+-----------+----------+---------------+ Basilic                                             Not visualized  +----------+------------+---------+-----------+----------+---------------+  Left Findings: +----------+------------+---------+-----------+----------+-----------------+ LEFT      CompressiblePhasicitySpontaneousProperties     Summary      +----------+------------+---------+-----------+----------+-----------------+ IJV            Full       Yes       Yes                                +----------+------------+---------+-----------+----------+-----------------+ Subclavian               Yes       Yes                                +----------+------------+---------+-----------+----------+-----------------+ Axillary                 Yes       Yes                                +----------+------------+---------+-----------+----------+-----------------+ Brachial      Full       Yes       Yes                                +----------+------------+---------+-----------+----------+-----------------+ Radial                                               patent by color  +----------+------------+---------+-----------+----------+-----------------+ Ulnar                                                patent by color  +----------+------------+---------+-----------+----------+-----------------+ Cephalic      None                                       Chronic      +----------+------------+---------+-----------+----------+-----------------+ Basilic       None                                  Age Indeterminate +----------+------------+---------+-----------+----------+-----------------+  Summary:  Right: No evidence of deep vein thrombosis in the upper extremity. No evidence of superficial vein thrombosis in the upper extremity.  Left: No evidence of deep vein thrombosis in the upper extremity. Findings consistent with age indeterminate superficial vein  thrombosis involving the left basilic vein. 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  *See table(s) above for measurements and observations.    Preliminary      Scheduled Meds:  aspirin EC  81 mg Oral Daily   atorvastatin  40 mg Oral QHS   bisacodyl  10 mg Rectal Once   buPROPion  150 mg Oral Daily   clopidogrel  75 mg Oral Daily   docusate sodium  100 mg Oral BID   enoxaparin (LOVENOX)  injection  40 mg Subcutaneous Q24H   lactulose  30 g Oral TID   metoprolol succinate  50 mg Oral Daily   polyethylene glycol  17 g Oral BID   sodium chloride flush  3 mL Intravenous Q12H   topiramate  100 mg Oral BID   venlafaxine XR  75 mg Oral Daily   cyanocobalamin  1,000 mcg Oral Daily   Continuous Infusions:  cefTRIAXone (ROCEPHIN)  IV Stopped (08/15/21 1501)   lactated ringers     metronidazole 500 mg (08/16/21 0240)     LOS: 6 days    Time spent: 35 minutes  Signature  Lala Lund M.D on 08/16/2021 at 11:04 AM   -  To page go to [email or web address]

## 2021-08-16 NOTE — Progress Notes (Signed)
Date and time results received: 08/16/21 1231 (use smartphrase ".now" to insert current time)  Test: lactic  Critical Value: 2.0  Name of Provider Notified: singh, prashant   Orders Received? Or Actions Taken?: Orders Received - See Orders for details

## 2021-08-16 NOTE — Progress Notes (Signed)
[facility]  Date of Admission:  08/20/2021     A/p Recurrent fever episodes x2 since 04/2021 Cryptogenic bilateral cerebral/cerebella stroke Torticollis Hx migraine     Unfortunate lady Extensive w/u for id has been performed. Her presentation and labs finding and clinical course are not suggestive of a chronic/recurrent ID etiology. So far it seems most of her sign/sx besides fever have been cns related (stroke). To play the devil's advocate and presumed this is ID related, would consider viral (covid screen negative, no recent covid vaccine) or other viral process such as flu (that is negative as well) or hsv (negative previous admission). This is not TB/cocci/crypto as those can be associated with cns vasculitis/stroke and without treatment is universally fatal and ones don't improve like she did. There are relapsing febrile syndrome that is of the id realm such as parasite, babesia, and some tick (epidemic typhus) but this is not consistent with her presentation although worthwhile to send a peripheral blood smear. Whipple can be a chronic picture but clinically her story and labs are not suggestive. Nothing to suggestive other chronic granulomatous infectious process such as bartonella, brucella/qfever.    Non-id could consider screening for antiphospholipid syndrome or checking jak-2, mutation given her stroke. Intravascular lymphoma can be difficult to diagnose and is often late when caught, but can consider sendign flow cytometry. Vasculitis/connective tissue disease remains high in ddx as improved with steroid last admission, and could benefit from formal rheumatologic evaluation   She doesn't exhibit a course of adult's still disease, familial febrile syndrome, pheochromacytoma.    It is interesting though she has brief episodes of hypokalemia during the fever. I don't know what to make of this and doesn't seem associated with stroke    -------- 9/19 assessment Blood smear negative Quantiferon gold pending (for ID to know in case prolonged chemo/immunosuppressant drugs needed). No suspecion tb active infection at this time Repeat blood cx and abd/pelv ct with contrast obtained for recurrent fever --> unremarkable. Procalcitonin normal level Symptomatically no new focalizing sign of infectious syndrome  Ana positive (no titer given). Anca in progress. Antiphospholipid syndrome serology/jak2 mutation testing for hypercoagulable w/u in process Ldh normal; flow cytometry peripheral blood in progress Ferritin only mildly elevated. Syndrome not c/w adult's stills   At this time, we haven't found evidence of any chronic infectious syndrome  We query given negative tee for embolic process, but just with pfo, in the setting recurrent strokes if she has a secondary hypercoagulable process. She could benefit from heme/rheumatology input   -consider discussing case with rheumatology/hematology -no other id w/u needed at this time; I do not see any significant yield of a pet ct or tagged wbc scan at this time -I don't see need for abx will d/c ceftriaxone/flagyl -id will sign off -discussed with primary team   I spent more than 35 minute reviewing data/chart, and coordinating care and >50% direct face to face time providing counseling/discussing diagnostics/treatment plan with patient   Active Problems:   Anxiety   Depression   Essential hypertension   GERD   Migraines   Sepsis secondary to UTI ([place])   Weakness   Cerebral embolism with cerebral infarction   Fever   Allergies  Allergen Reactions   Amitriptyline Hcl     Changed personality   Shellfish Allergy Nausea And Vomiting and Other (See Comments)    Tested "high" on allergy test   Lisinopril Cough    .  Amoxicillin Rash   Cephalexin Rash   Penicillins Rash    Scheduled Meds:  aspirin EC  81 mg Oral Daily   atorvastatin  40 mg Oral QHS   bisacodyl   10 mg Rectal Once   buPROPion  150 mg Oral Daily   clopidogrel  75 mg Oral Daily   docusate sodium  100 mg Oral BID   enoxaparin (LOVENOX) injection  40 mg Subcutaneous Q24H   lactulose  30 g Oral TID   metoprolol succinate  50 mg Oral Daily   polyethylene glycol  17 g Oral BID   sodium chloride flush  3 mL Intravenous Q12H   topiramate  100 mg Oral BID   [START ON 08/17/2021] venlafaxine XR  37.5 mg Oral Daily   cyanocobalamin  1,000 mcg Oral Daily   Continuous Infusions:  lactated ringers 100 mL/hr at 08/16/21 1140   PRN Meds:.acetaminophen **OR** acetaminophen, albuterol, butalbital-acetaminophen-caffeine, LORazepam, [DISCONTINUED] ondansetron **OR** ondansetron (ZOFRAN) IV, pantoprazole   SUBJECTIVE: Patient without new complaint Intermittent fever Repeat bcx negative 9/17 Abd pelv ct unremarkable No rash/joint pain No n/v/diarrhea No cough/chest pain/sob  Started on empiric abx over the weekend for fever  Review of Systems: ROS All other ROS was negative, except mentioned above     OBJECTIVE: Vitals:   08/16/21 0701 08/16/21 0802 08/16/21 0901 08/16/21 1248  BP: 100/86 96/81 95/74  115/76  Pulse: (!) 127 (!) 114 (!) 108 (!) 104  Resp: (!) 22 17 20 15   Temp: (!) 101.8 F (38.8 C) 98.9 F (37.2 C) 99 F (37.2 C) 98.7 F (37.1 C)  TempSrc: Rectal Axillary Axillary Oral  SpO2: 97% 96% 96% 95%  Weight:      Height:       Body mass index is 20.28 kg/m.  Physical Exam  General/constitutional: no distress, conversant,  HEENT: Normocephalic, PER, Conj Clear, EOMI, Oropharynx clear Neck supple; head turned to right side today vs left side the last several days CV: rrr no mrg Lungs: clear to auscultation, normal respiratory effort Abd: Soft, Nontender Ext: no edema Skin: No Rash Neuro: generalized weakness with more of a left side paresis MSK: no peripheral joint swelling/tenderness/warmth; back spines nontender  Lab Results Lab Results  Component Value  Date   WBC 10.3 08/16/2021   HGB 12.2 08/16/2021   HCT 38.0 08/16/2021   MCV 83.7 08/16/2021   PLT 413 (H) 08/16/2021    Lab Results  Component Value Date   CREATININE 0.69 08/16/2021   BUN 20 08/16/2021   NA 140 08/16/2021   K 3.8 08/16/2021   CL 112 (H) 08/16/2021   CO2 19 (L) 08/16/2021    Lab Results  Component Value Date   ALT 11 08/16/2021   AST 26 08/16/2021   ALKPHOS 41 08/16/2021   BILITOT 0.5 08/16/2021      Microbiology: Recent Results (from the past 240 hour(s))  Urine Culture     Status: Abnormal   Collection Time: 08/02/2021 10:27 AM   Specimen: In/Out Cath Urine  Result Value Ref Range Status   Specimen Description IN/OUT CATH URINE  Final   Special Requests   Final    NONE Performed at [facility], [address], [place], [place] [address]    Culture 30,000 COLONIES/mL KLEBSIELLA PNEUMONIAE (A)  Final   Report Status 08/12/2021 FINAL  Final   Organism ID, Bacteria KLEBSIELLA PNEUMONIAE (A)  Final      Susceptibility   Klebsiella pneumoniae - MIC*    AMPICILLIN  RESISTANT Resistant     CEFAZOLIN <=4 SENSITIVE Sensitive     CEFEPIME <=0.12 SENSITIVE Sensitive     CEFTRIAXONE <=0.25 SENSITIVE Sensitive     CIPROFLOXACIN <=0.25 SENSITIVE Sensitive     GENTAMICIN <=1 SENSITIVE Sensitive     IMIPENEM <=0.25 SENSITIVE Sensitive     NITROFURANTOIN 64 INTERMEDIATE Intermediate     TRIMETH/SULFA <=20 SENSITIVE Sensitive     AMPICILLIN/SULBACTAM 4 SENSITIVE Sensitive     PIP/TAZO <=4 SENSITIVE Sensitive     * 30,000 COLONIES/mL KLEBSIELLA PNEUMONIAE  Resp Panel by RT-PCR (Flu A&B, Covid) Nasopharyngeal Swab     Status: None   Collection Time: 08/04/2021 10:39 AM   Specimen: Nasopharyngeal Swab; Nasopharyngeal(NP) swabs in vial transport medium  Result Value Ref Range Status   SARS Coronavirus 2 by RT PCR NEGATIVE NEGATIVE Final    Comment: (NOTE) SARS-CoV-2 target nucleic acids are NOT DETECTED.  The SARS-CoV-2 RNA is generally detectable in upper  respiratory specimens during the acute phase of infection. The lowest concentration of SARS-CoV-2 viral copies this assay can detect is 138 copies/mL. A negative result does not preclude SARS-Cov-2 infection and should not be used as the sole basis for treatment or other patient management decisions. A negative result may occur with  improper specimen collection/handling, submission of specimen other than nasopharyngeal swab, presence of viral mutation(s) within the areas targeted by this assay, and inadequate number of viral copies(<138 copies/mL). A negative result must be combined with clinical observations, patient history, and epidemiological information. The expected result is Negative.  Fact Sheet for Patients:  EntrepreneurPulse.com.au  Fact Sheet for Healthcare Providers:  IncredibleEmployment.be  This test is no t yet approved or cleared by the [place] FDA and  has been authorized for detection and/or diagnosis of SARS-CoV-2 by FDA under an Emergency Use Authorization (EUA). This EUA will remain  in effect (meaning this test can be used) for the duration of the COVID-19 declaration under Section 564(b)(1) of the Act, 21 U.S.C.section 360bbb-3(b)(1), unless the authorization is terminated  or revoked sooner.       Influenza A by PCR NEGATIVE NEGATIVE Final   Influenza B by PCR NEGATIVE NEGATIVE Final    Comment: (NOTE) The Xpert Xpress SARS-CoV-2/FLU/RSV plus assay is intended as an aid in the diagnosis of influenza from Nasopharyngeal swab specimens and should not be used as a sole basis for treatment. Nasal washings and aspirates are unacceptable for Xpert Xpress SARS-CoV-2/FLU/RSV testing.  Fact Sheet for Patients: EntrepreneurPulse.com.au  Fact Sheet for Healthcare Providers: IncredibleEmployment.be  This test is not yet approved or cleared by the [place] FDA and has been  authorized for detection and/or diagnosis of SARS-CoV-2 by FDA under an Emergency Use Authorization (EUA). This EUA will remain in effect (meaning this test can be used) for the duration of the COVID-19 declaration under Section 564(b)(1) of the Act, 21 U.S.C. section 360bbb-3(b)(1), unless the authorization is terminated or revoked.  Performed at [facility], [place] [address] [place], [place] [address]   Blood Culture (routine x 2)     Status: None   Collection Time: 08/17/2021 10:39 AM   Specimen: BLOOD RIGHT HAND  Result Value Ref Range Status   Specimen Description BLOOD RIGHT HAND  Final   Special Requests   Final    BOTTLES DRAWN AEROBIC ONLY Blood Culture results may not be optimal due to an inadequate volume of blood received in culture bottles   Culture   Final    NO GROWTH  5 DAYS Performed at [facility], [place] [address], [place], [place] [address]    Report Status 08/15/2021 FINAL  Final  Blood Culture (routine x 2)     Status: None   Collection Time: 08/25/2021 10:39 AM   Specimen: BLOOD  Result Value Ref Range Status   Specimen Description BLOOD RIGHT ANTECUBITAL  Final   Special Requests   Final    BOTTLES DRAWN AEROBIC AND ANAEROBIC Blood Culture adequate volume   Culture   Final    NO GROWTH 5 DAYS Performed at [facility], [place] [address] [place], [place] [address]    Report Status 08/15/2021 FINAL  Final  Urine Culture     Status: None   Collection Time: 08/14/21  7:59 AM   Specimen: In/Out Cath Urine  Result Value Ref Range Status   Specimen Description IN/OUT CATH URINE  Final   Special Requests NONE  Final   Culture   Final    NO GROWTH Performed at [facility], [place] [address] [place], [place] [address]    Report Status 08/15/2021 FINAL  Final  Culture, blood (routine x 2)     Status: None (Preliminary result)   Collection Time: 08/14/21 12:16 PM   Specimen: BLOOD  Result Value Ref Range Status   Specimen Description BLOOD  SITE NOT SPECIFIED  Final   Special Requests   Final    BOTTLES DRAWN AEROBIC ONLY Blood Culture adequate volume   Culture   Final    NO GROWTH 2 DAYS Performed at [facility], [place] [address], [place], [place] [address]    Report Status PENDING  Incomplete  Culture, blood (routine x 2)     Status: None (Preliminary result)   Collection Time: 08/14/21 12:16 PM   Specimen: BLOOD  Result Value Ref Range Status   Specimen Description BLOOD SITE NOT SPECIFIED  Final   Special Requests   Final    BOTTLES DRAWN AEROBIC ONLY Blood Culture results may not be optimal due to an inadequate volume of blood received in culture bottles   Culture   Final    NO GROWTH 2 DAYS Performed at [facility], [place] [address], [place], [place] [address]    Report Status PENDING  Incomplete  Resp Panel by RT-PCR (Flu A&B, Covid) Nasopharyngeal Swab     Status: None   Collection Time: 08/15/21  9:55 AM   Specimen: Nasopharyngeal Swab; Nasopharyngeal(NP) swabs in vial transport medium  Result Value Ref Range Status   SARS Coronavirus 2 by RT PCR NEGATIVE NEGATIVE Final    Comment: (NOTE) SARS-CoV-2 target nucleic acids are NOT DETECTED.  The SARS-CoV-2 RNA is generally detectable in upper respiratory specimens during the acute phase of infection. The lowest concentration of SARS-CoV-2 viral copies this assay can detect is 138 copies/mL. A negative result does not preclude SARS-Cov-2 infection and should not be used as the sole basis for treatment or other patient management decisions. A negative result may occur with  improper specimen collection/handling, submission of specimen other than nasopharyngeal swab, presence of viral mutation(s) within the areas targeted by this assay, and inadequate number of viral copies(<138 copies/mL). A negative result must be combined with clinical observations, patient history, and epidemiological information. The expected result is Negative.  Fact Sheet for  Patients:  EntrepreneurPulse.com.au  Fact Sheet for Healthcare Providers:  IncredibleEmployment.be  This test is no t yet approved or cleared by the [place] and  has been authorized for detection and/or diagnosis of SARS-CoV-2 by FDA under an Emergency Use Authorization (EUA). This EUA will remain  in effect (meaning this test can be used) for the duration of the COVID-19 declaration under Section 564(b)(1) of the Act, 21 U.S.C.section 360bbb-3(b)(1), unless the authorization is terminated  or revoked sooner.       Influenza A by PCR NEGATIVE NEGATIVE Final   Influenza B by PCR NEGATIVE NEGATIVE Final    Comment: (NOTE) The Xpert Xpress SARS-CoV-2/FLU/RSV plus assay is intended as an aid in the diagnosis of influenza from Nasopharyngeal swab specimens and should not be used as a sole basis for treatment. Nasal washings and aspirates are unacceptable for Xpert Xpress SARS-CoV-2/FLU/RSV testing.  Fact Sheet for Patients: EntrepreneurPulse.com.au  Fact Sheet for Healthcare Providers: IncredibleEmployment.be  This test is not yet approved or cleared by the [place] FDA and has been authorized for detection and/or diagnosis of SARS-CoV-2 by FDA under an Emergency Use Authorization (EUA). This EUA will remain in effect (meaning this test can be used) for the duration of the COVID-19 declaration under Section 564(b)(1) of the Act, 21 U.S.C. section 360bbb-3(b)(1), unless the authorization is terminated or revoked.  Performed at [facility], [place] [address], [place], [place] [address]      Serology:   Imaging: If present, new imagings (plain films, ct scans, and mri) have been personally visualized and interpreted; radiology reports have been reviewed. Decision making incorporated into the Impression / Recommendations.  9/18 ct abd pelv 1. Heterogeneous material within the dependent  left urinary bladder, dependently hyperdense. This may reflect some combination of excreted contrast with mixing artifact, clot, or small bladder stones/calcific debris. Endoluminal bladder mass is difficult to strictly exclude. Recommend delayed phase CT urogram to further evaluate. 2. Large burden of stool throughout the distal colon. 3. Anasarca.  9/18 ct head No acute intracranial process. No significant change from the prior exam.  Jabier Mutton, [facility] [place] [phone] pager    08/16/2021, 1:47 PM

## 2021-08-17 ENCOUNTER — Inpatient Hospital Stay (HOSPITAL_COMMUNITY): Payer: Medicare HMO

## 2021-08-17 LAB — URINALYSIS, MICROSCOPIC (REFLEX): WBC, UA: >50 WBC/hpf (ref 0–5)

## 2021-08-17 LAB — COMPREHENSIVE METABOLIC PANEL
ALT: 12 U/L (ref 0–44)
AST: 31 U/L (ref 15–41)
Albumin: 1.8 g/dL — ABNORMAL LOW (ref 3.5–5.0)
Alkaline Phosphatase: 38 U/L (ref 38–126)
Anion gap: 10 (ref 5–15)
BUN: 23 mg/dL (ref 8–23)
CO2: 20 mmol/L — ABNORMAL LOW (ref 22–32)
Calcium: 8.2 mg/dL — ABNORMAL LOW (ref 8.9–10.3)
Chloride: 108 mmol/L (ref 98–111)
Creatinine, Ser: 0.85 mg/dL (ref 0.44–1.00)
GFR, Estimated: >60 mL/min (ref >60)
Glucose, Bld: 135 mg/dL — ABNORMAL HIGH (ref 70–99)
Potassium: 3.5 mmol/L (ref 3.5–5.1)
Sodium: 138 mmol/L (ref 135–145)
Total Bilirubin: 0.4 mg/dL (ref 0.3–1.2)
Total Protein: 5 g/dL — ABNORMAL LOW (ref 6.5–8.1)

## 2021-08-17 LAB — URINALYSIS, ROUTINE W REFLEX MICROSCOPIC
Bilirubin Urine: NEGATIVE
Glucose, UA: NEGATIVE mg/dL
Ketones, ur: NEGATIVE mg/dL
Nitrite: POSITIVE — AB
Protein, ur: 30 mg/dL — AB
Specific Gravity, Urine: 1.02 (ref 1.005–1.030)
pH: 6 (ref 5.0–8.0)

## 2021-08-17 LAB — QUANTIFERON-TB GOLD PLUS: QuantiFERON-TB Gold Plus: UNDETERMINED — AB

## 2021-08-17 LAB — CBC WITH DIFFERENTIAL/PLATELET
Abs Immature Granulocytes: 0.17 10*3/uL — ABNORMAL HIGH (ref 0.00–0.07)
Basophils Absolute: 0 10*3/uL (ref 0.0–0.1)
Basophils Relative: 0 %
Eosinophils Absolute: 0.1 10*3/uL (ref 0.0–0.5)
Eosinophils Relative: 1 %
HCT: 39.1 % (ref 36.0–46.0)
Hemoglobin: 12.4 g/dL (ref 12.0–15.0)
Immature Granulocytes: 1 %
Lymphocytes Relative: 6 %
Lymphs Abs: 0.8 10*3/uL (ref 0.7–4.0)
MCH: 27 pg (ref 26.0–34.0)
MCHC: 31.7 g/dL (ref 30.0–36.0)
MCV: 85 fL (ref 80.0–100.0)
Monocytes Absolute: 0.5 10*3/uL (ref 0.1–1.0)
Monocytes Relative: 4 %
Neutro Abs: 11.8 10*3/uL — ABNORMAL HIGH (ref 1.7–7.7)
Neutrophils Relative %: 88 %
Platelets: 435 10*3/uL — ABNORMAL HIGH (ref 150–400)
RBC: 4.6 MIL/uL (ref 3.87–5.11)
RDW: 16.3 % — ABNORMAL HIGH (ref 11.5–15.5)
WBC: 13.4 10*3/uL — ABNORMAL HIGH (ref 4.0–10.5)
nRBC: 0.1 % (ref 0.0–0.2)

## 2021-08-17 LAB — MAGNESIUM: Magnesium: 1.8 mg/dL (ref 1.7–2.4)

## 2021-08-17 LAB — D-DIMER, QUANTITATIVE: D-Dimer, Quant: 4.1 ug/mL-FEU — ABNORMAL HIGH (ref 0.00–0.50)

## 2021-08-17 LAB — LACTATE DEHYDROGENASE: LDH: 447 U/L — ABNORMAL HIGH (ref 98–192)

## 2021-08-17 LAB — QUANTIFERON-TB GOLD PLUS (RQFGPL)
QuantiFERON Mitogen Value: 0.23 IU/mL
QuantiFERON Nil Value: 0.09 IU/mL
QuantiFERON TB1 Ag Value: 0.1 IU/mL
QuantiFERON TB2 Ag Value: 0.09 IU/mL

## 2021-08-17 LAB — PROCALCITONIN: Procalcitonin: 0.55 ng/mL

## 2021-08-17 LAB — C-REACTIVE PROTEIN: CRP: 9.8 mg/dL — ABNORMAL HIGH (ref <1.0)

## 2021-08-17 LAB — CYCLIC CITRUL PEPTIDE ANTIBODY, IGG/IGA: CCP Antibodies IgG/IgA: 11 units (ref 0–19)

## 2021-08-17 IMAGING — CT CT ANGIO CHEST
2 of 7 series · 18 of 46 positions shown · IV contrast (APPLIED)
Comparison: CTA chest [DATE], correlation is also made with CT
chest without contrast [DATE]

CLINICAL DATA: PE suspected, high probability

EXAM:
CT ANGIOGRAPHY CHEST WITH CONTRAST
TECHNIQUE: Multidetector CT imaging of the chest was performed using the
standard protocol during bolus administration of intravenous
contrast. Multiplanar CT image reconstructions and MIPs were
obtained to evaluate the vascular anatomy.
CONTRAST:  100mL OMNIPAQUE IOHEXOL 350 MG/ML SOLN

[Series 10: cor · coronal · 0.57mm/px · 3 of 100 slices shown]
[im 25/100  soft-tissue]
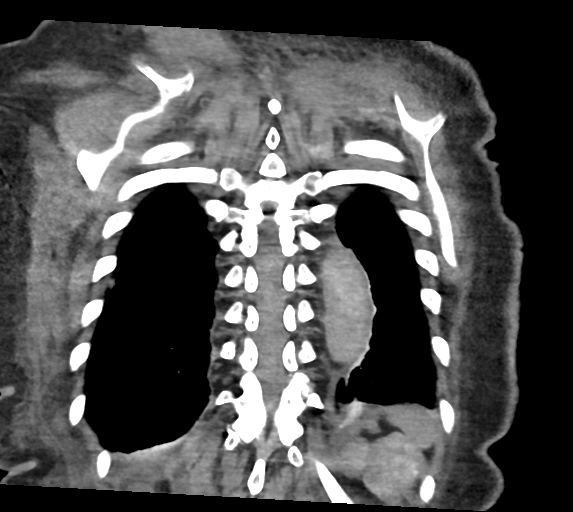
[im 50/100  soft-tissue]
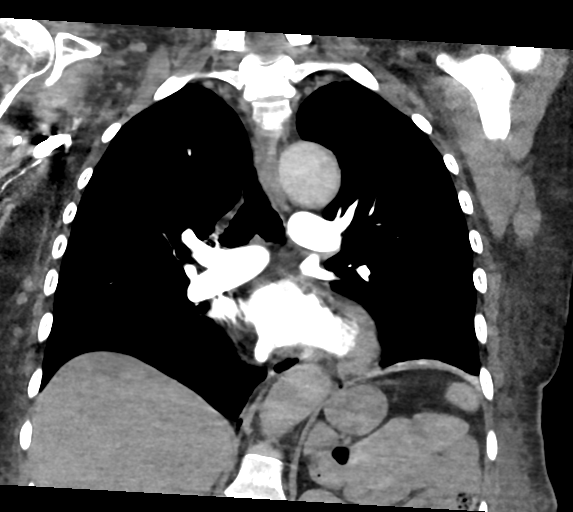
[im 75/100  soft-tissue]
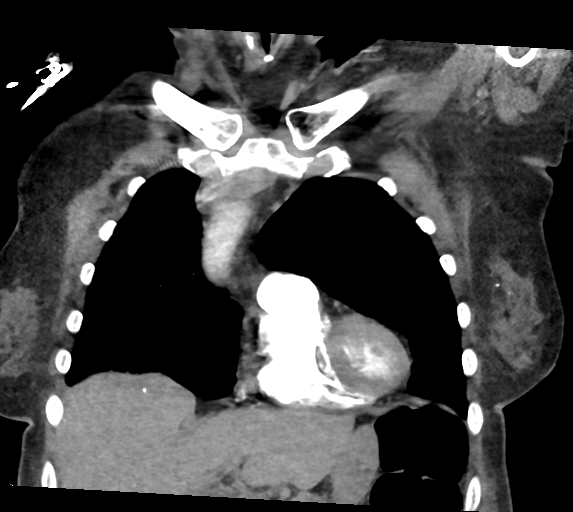

[Series 15: thins · axial · 0.60mm/px · z∈[+10,+245]mm · 15 of 381 slices shown]
[im 22/381  lung]
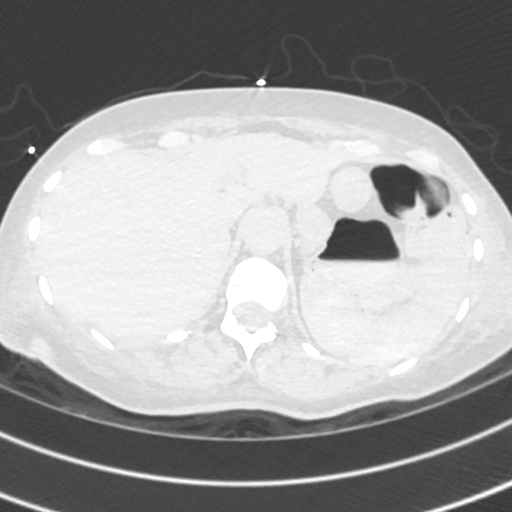
[im 43/381  soft-tissue]
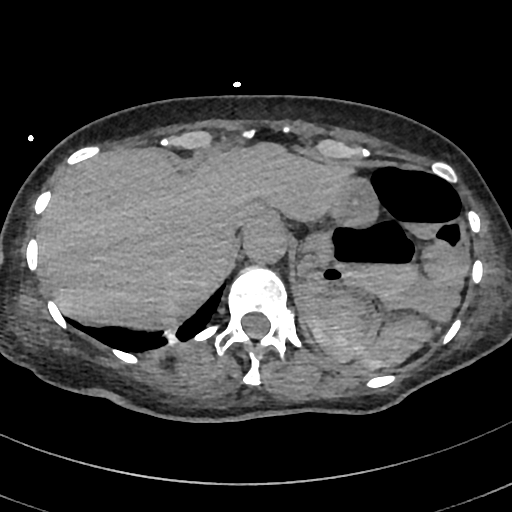
[im 64/381  lung]
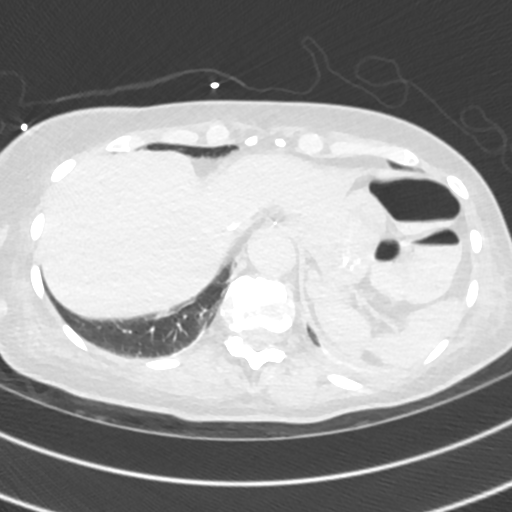
[im 85/381  soft-tissue]
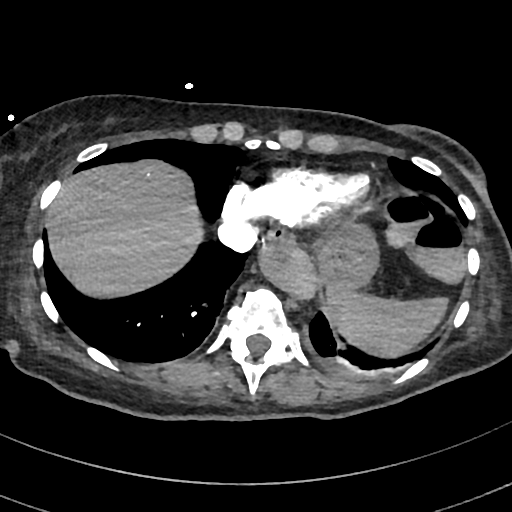
[im 127/381  lung]
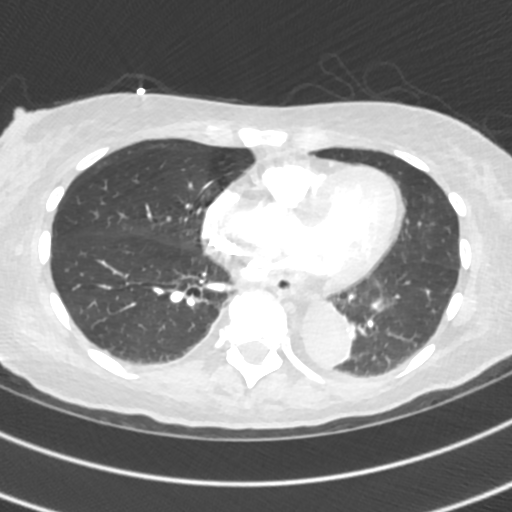
[im 148/381  soft-tissue]
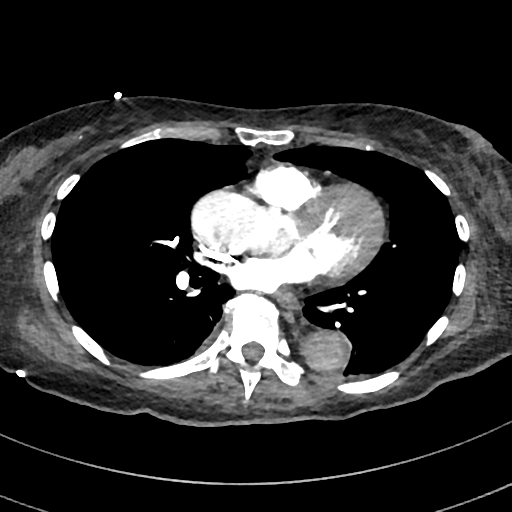
[im 169/381  lung]
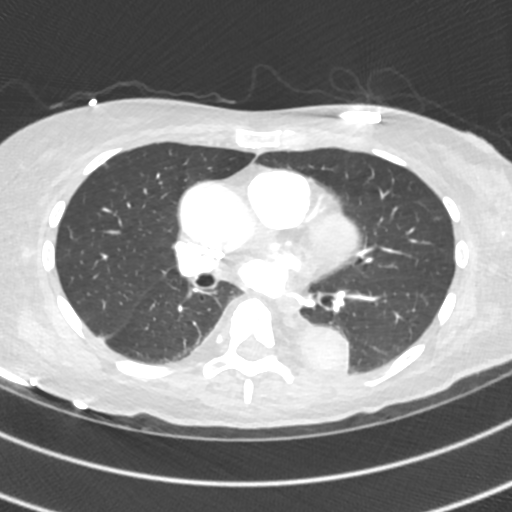
[im 191/381  soft-tissue]
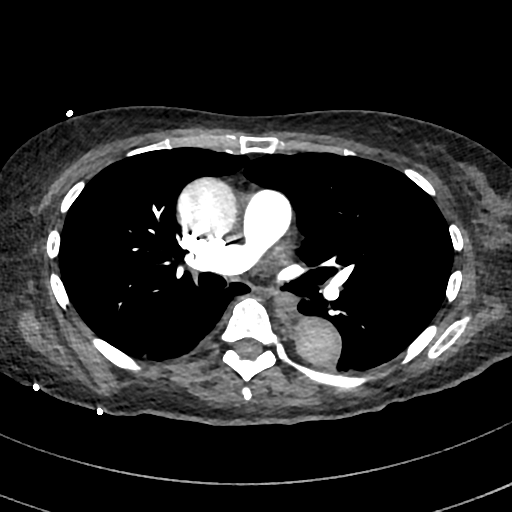
[im 212/381  lung]
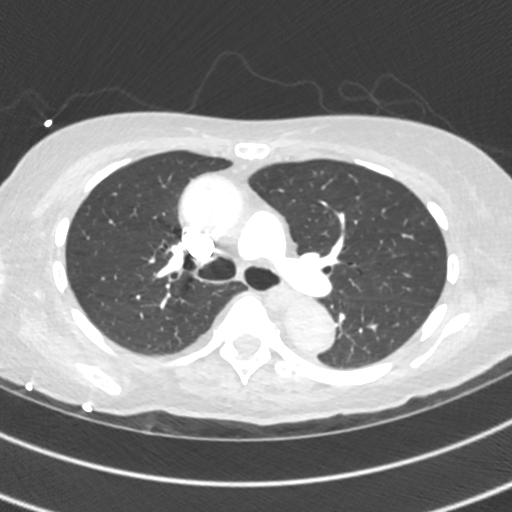
[im 233/381  soft-tissue]
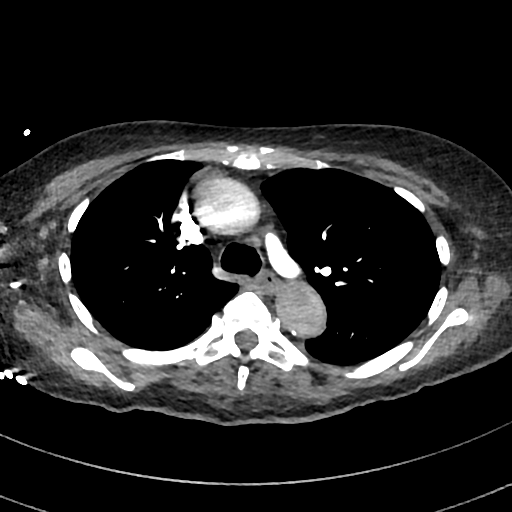
[im 254/381  lung]
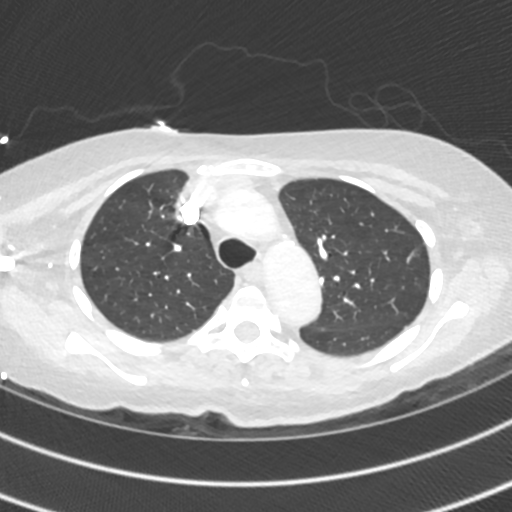
[im 296/381  soft-tissue]
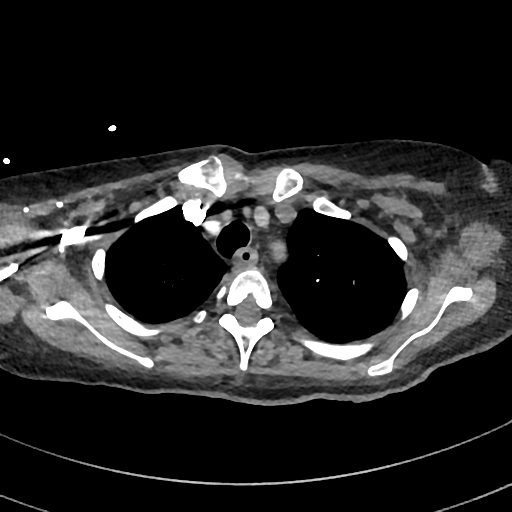
[im 317/381  lung]
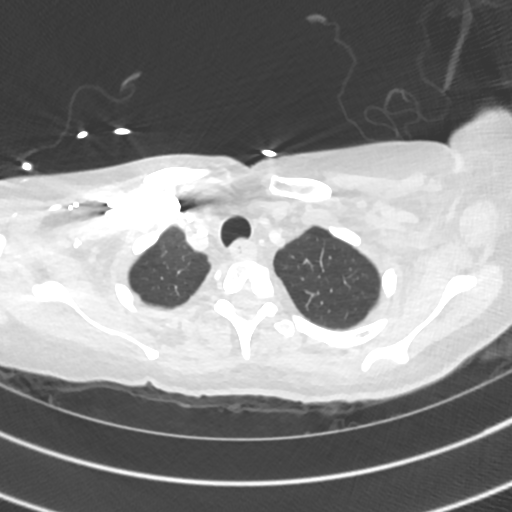
[im 338/381  soft-tissue]
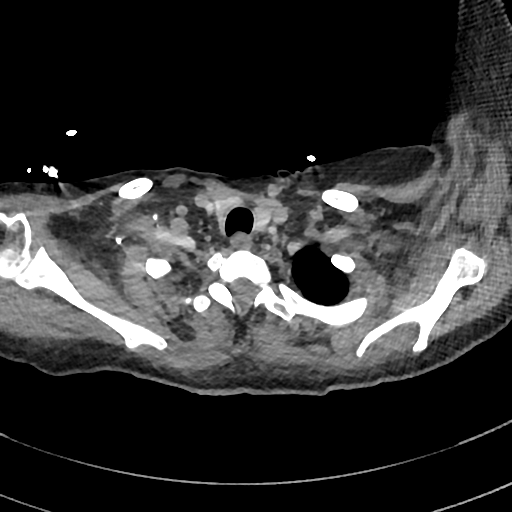
[im 359/381  lung]
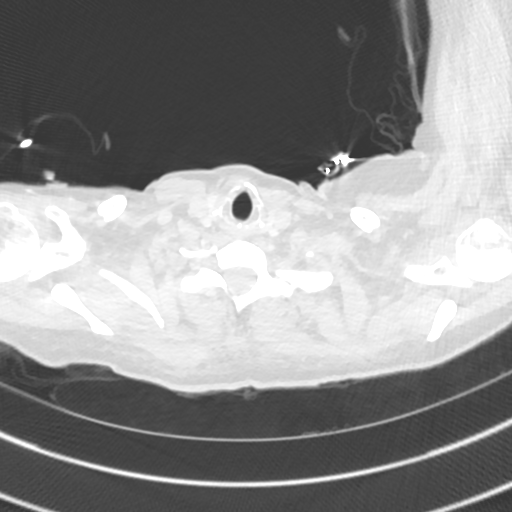

[18 of 46 positions shown; findings below may reference images not displayed]

FINDINGS: Cardiovascular: Satisfactory opacification of the pulmonary arteries
to the segmental level. No evidence of pulmonary embolism. Normal
heart size. No pericardial effusion. Aortic atherosclerosis.

Mediastinum/Nodes: No enlarged mediastinal, hilar, or axillary lymph
nodes. Thyroid gland, trachea, and esophagus demonstrate no
significant findings.

Lungs/Pleura: Trace left-greater-than-right pleural fluid, with
minimal associated atelectasis. Unchanged 4 mm nodule in the right
middle lobe (series 15, image 262), which has been stable over
multiple prior exams and may represent a subpleural lymph node.

Upper Abdomen: No acute abnormality. High-density material within
the stomach was present on the noncontrast exam and likely
represents ingested material.

Musculoskeletal: No acute osseous abnormality.

Review of the MIP images confirms the above findings.
IMPRESSION: 1. Negative for pulmonary embolism.
2. Redemonstrated trace left-greater-than-right pleural fluid with
associated atelectasis.
3.  Aortic Atherosclerosis ([JU]-[JU]).

## 2021-08-17 MED ORDER — IOHEXOL 350 MG/ML SOLN
100.0000 mL | Freq: Once | INTRAVENOUS | Status: AC | PRN
  Administered 2021-08-17: 100 mL via INTRAVENOUS

## 2021-08-17 MED ORDER — METOPROLOL TARTRATE 5 MG/5ML IV SOLN
5.0000 mg | Freq: Three times a day (TID) | INTRAVENOUS | Status: DC | PRN
  Administered 2021-08-17 – 2021-08-21 (×3): 5 mg via INTRAVENOUS
  Filled 2021-08-17 (×3): qty 5

## 2021-08-17 NOTE — Progress Notes (Signed)
   08/17/21 1209  Assess: MEWS Score  Temp 98.7 F (37.1 C)  BP 120/87  Pulse Rate (!) 123  ECG Heart Rate (!) 123  Resp (!) 22  SpO2 97 %  Assess: MEWS Score  MEWS Temp 0  MEWS Systolic 0  MEWS Pulse 2  MEWS RR 1  MEWS LOC 0  MEWS Score 3  MEWS Score Color Yellow  Assess: if the MEWS score is Yellow or Red  Were vital signs taken at a resting state? Yes  Focused Assessment No change from prior assessment  Early Detection of Sepsis Score *See Row Information* Low  MEWS guidelines implemented *See Row Information* Yes  Treat  MEWS Interventions Administered scheduled meds/treatments;Escalated (See documentation below)  Take Vital Signs  Increase Vital Sign Frequency  Yellow: Q 2hr X 2 then Q 4hr X 2, if remains yellow, continue Q 4hrs  Escalate  MEWS: Escalate Yellow: discuss with charge nurse/RN and consider discussing with provider and RRT  Notify: Charge Nurse/RN  Name of Charge Nurse/RN Notified John RN  Date Charge Nurse/RN Notified 08/17/21  Time Charge Nurse/RN Notified 0301  Document  Patient Outcome Stabilized after interventions  Progress note created (see row info) Yes

## 2021-08-17 NOTE — Progress Notes (Addendum)
[facility] PROGRESS NOTE    Michaela Rios  ZOX:[id number] DOB: 04-Aug-1951 DOA: 08/09/2021 PCP: Binnie Rail, MD      Brief Narrative:  Michaela Rios is a 70 y.o. F with HTN, CKD IIIa, and PVD as well as recent admission for stroke and FUO presented again with fever and weakness.    History of current illness begins late June (prior to which patient was independent, no dementia, lived with husband, normal with ADLs):  6/27: Initially presented w/ 1 week urinary symptoms, generalized weakness -> temp 101F, HR 140s in the ER, started on treatment for sepsis 6/29: Still encephalopathic out of proportion to expected for sepsis, CT head obtained, showed abnormal signal in BGs -> f/u MRI showed acute right BG stroke, acute left BG/thalamus/caudate T2 hyperintensity, neuro consulted, no focal weakness at that time 6/30: first mention of left sided weakness on exam; LP this date, culture negative, no WBC, Crypto neg, HSV PCR neg, protein elevated and 22 RBCs in tubes 1 and 4, not enough fluid for flow cytometry 7/2: Recurrent fever, ID consulted for FUO  7/2-7/5: Work up for systemic vasculitis included ANCA, complements, ANA, anti-dsDNA, Ro/La Abs, RF.  All negative except ANA+, CRP ~7.  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  The patient's fever resolved with stopping antibiotics.  She was treated with Solu-medrol 1g for 3 days.    7/5: Cerebral angiography was unremarkable.  7/12: She was discharged to CIR, where she progressed to the point of being interactive, ambulatory with a walker.   8/4: Returned home from [place] where she remained ambulatory, interactive for 1-2 weeks, then started to get worse.  9/4: MRI brain w/wo contrast on 9/4 showed evolution of right BG infarct, no change in L BG T2 hyperintensity, and  new acute/subacute infarcts in the left BG.  Around this time, the patient had worsening generalized weakness, worse PO intake, more somnolence and psychomotor slowing, and husband brought her back to the ER.    On return to the ER this admission, she was febrile, tachycardic, but WBC normal, lactate normal, CXR normal.  UA with bacteria, started on broad spectrum antibiotics and admitted.    Subjective:  Patient in bed, appears comfortable, denies any headache, no fever, no chest pain or pressure, no shortness of breath , no abdominal pain. No new focal weakness.     Assessment & Plan:  Fever - off and on could be neurological,  but LLL Infilterate on 08/14/21, high risk for aspiration, SLP following, was given 5 days of Rocephin and Flagyl, CT scan abdomen pelvis with IV contrast was unremarkable, venous duplex of the lower and upper extremities shows no blood clot.  Still running nighttime fevers.  Per ID no clear signs of infection.    However still night time temps > 102, repeat BC is negative, ABX finished on 19th but now has Leukocytosis, will check UA-UC, CT Chest, LDH, ANA, Smear - gentle IVF and monitor.   4.15  pm - H.Rate > 120s, TSH stable, no fever, EKG, TTE and CTA now.    Acute metabolic encephalopathy - Could be due to CVA + PNA, TSH normal, B12 normal.  She was on 2 central acting agents venlafaxine for anxiety and Topamax for migraines, Reduced Topamax, Taper and stop venlafaxine.  Subacute strokes - Left caudate and putamen infarct  embolic secondary to likely small vessel disease .  Prior history of multiple cryptogenic strokes in July 2022 with negative extensive work-up she does have a PFO but at her age she has a low rope score of 2 making PFO unlikely cause of her strokes. DW Dr Erlinda Hong 08/17/21 - CVA unlikely causing fevers.  Seen by CVA team, ILR interrogated, this captured no events, Afib unlikely.  (The tachycardia noted on the report is consistent with her observed  sinus tachycardia here), per CVA team -  Aspirin and Plavix for 3 weeks then Plavix alone, Continue new atorvastatin, TCD confirms PFO, Leg [place] -ve for DVT.  Torticollis - due to CVA, much better.   Sinus tachycardia  She remains tachycardic, not eating at all.  Started IVF, stable TSH.  Hypertension  - BP normal, Continue amlodipine, metoprolol   CKD IIIa - stable.  Chronic migraines - No headaches,  reduced Topamax  Anxiety - Taper and stop venlafaxine, Reduced bupropion   Hyponatremia Mild asymptomatic  Hypokalemia Supplemented and ersovled  Anemia, normocytic Mild, asymptomatic  Lung nodule 5mm incidental nodule noted last admission.  Outpt Pulm follow up.  Severe stool burden with constipation.  Placed on Bowel regimen.    PFO.  Cardiology outpatient follow-up for elective closure if family desires.    Disposition: Status is: Inpatient  Remains inpatient appropriate because:IV treatments appropriate due to intensity of illness or inability to take PO  Dispo: The patient is from: Home              Anticipated d/c is to: SNF              Patient currently is not medically stable to d/c.   Difficult to place patient No  Level of care: Tele   MDM: The below labs and imaging reports were reviewed and summarized above.  Medication management as above.    DVT prophylaxis: enoxaparin (LOVENOX) injection 40 mg Start: 08/05/2021 1600  Code Status: FULL  Family Communication:  Husband Levada Dy - [phone] - on 08/15/21    Consultants:   ID Neurology  Procedures:   9/13 CXR clear 9/14 Lower extremity US DVT -- no DVT 9/14 transcranial doppler with bubbles -- TCD bubble study positive for large right-to-left shunt +ve grade 5 PFO - DW Dr Erlinda Hong 08/14/21 CT head 9/17 - non acute Venous US upper extremities - no DVT CT abd Pelvis IV Contrast - constipation CT Chest -   Antimicrobials:   Vancomycin, flagyl x1 on 9/13 Aztreonam 9/13 >> 9/14 Rocephin    9/15 >>  9/19 Flagyl 9/15>>9/19  Culture data:    9/13 blood culture -- ngtd 9/13 urine culture -- <30K colonies klebnsiella   Objective: Vitals:   08/17/21 0746 08/17/21 0824 08/17/21 1028 08/17/21 1107  BP: (!) 117/98     Pulse: (!) 110     Resp: 20     Temp: 97.7 F (36.5 C)     TempSrc: Oral     SpO2: 93% 98% 99% 98%  Weight:      Height:        Intake/Output Summary (Last 24 hours) at 08/17/2021 1115 Last data filed at 08/17/2021 0255 Gross per 24 hour  Intake 452.7 ml  Output 1 ml  Net 451.7 ml   Filed Weights   08/11/21 2312 08/14/21 0400 08/15/21 0455  Weight: 54.5 kg 55.1 kg 57 kg    Examination:  Awake Alert, neck is tilted to left, strength 4/5 in all extremities except for  the left leg which is 3/5. Leal.AT,PERRAL Supple Neck,No JVD, No cervical lymphadenopathy appriciated.  Symmetrical Chest wall movement, Good air movement bilaterally, CTAB RRR,No Gallops, Rubs or new Murmurs, No Parasternal Heave +ve B.Sounds, Abd Soft, No tenderness, No organomegaly appriciated, No rebound - guarding or rigidity. No Cyanosis, Clubbing or edema, No new Rash or bruise    Data Reviewed: I have personally reviewed following labs and imaging studies:  Recent Labs  Lab 08/13/21 0124 08/14/21 0716 08/15/21 0055 08/16/21 0249 08/17/21 0505  WBC 9.2 10.3 11.2* 10.3 13.4*  HGB 13.4 11.5* 11.6* 12.2 12.4  HCT 40.7 35.2* 35.3* 38.0 39.1  PLT 442* 381 391 413* 435*  MCV 84.6 84.6 84.0 83.7 85.0  MCH 27.9 27.6 27.6 26.9 27.0  MCHC 32.9 32.7 32.9 32.1 31.7  RDW 15.9* 16.1* 16.3* 16.2* 16.3*  LYMPHSABS  --   --  0.6* 0.7 0.8  MONOABS  --   --  0.6 0.4 0.5  EOSABS  --   --  0.2 0.3 0.1  BASOSABS  --   --  0.0 0.0 0.0    Recent Labs  Lab 08/11/21 0706 08/12/21 0055 08/13/21 0124 08/14/21 0138 08/14/21 0716 08/15/21 0055 08/15/21 0816 08/16/21 0249 08/16/21 0713 08/16/21 1015 08/17/21 0505  NA  --    < > 134* 135  --  137  --  140  --   --  138  K 3.0*   < > 4.4 3.9   --  3.9  --  3.8  --   --  3.5  CL  --    < > 105 107  --  109  --  112*  --   --  108  CO2  --    < > 18* 18*  --  19*  --  19*  --   --  20*  GLUCOSE  --    < > 128* 114*  --  101*  --  109*  --   --  135*  BUN  --    < > 20 32*  --  26*  --  20  --   --  23  CREATININE  --    < > 0.77 0.91  --  0.77  --  0.69  --   --  0.85  CALCIUM  --    < > 8.7* 8.6*  --  8.2*  --  8.2*  --   --  8.2*  AST  --   --   --   --   --  24  --  26  --   --  31  ALT  --   --   --   --   --  11  --  11  --   --  12  ALKPHOS  --   --   --   --   --  41  --  41  --   --  38  BILITOT  --   --   --   --   --  0.6  --  0.5  --   --  0.4  ALBUMIN  --   --   --   --   --  2.0*  --  1.9*  --   --  1.8*  MG 1.8  --   --   --   --  1.9  --  1.7  --   --  1.8  CRP  --   --   --  8.6*  --  12.0*  --  10.9*  --   --  9.8*  DDIMER  --   --   --   --   --   --  3.22* 3.67*  --   --  4.10*  PROCALCITON  --   --   --   --  0.21 0.21  --  0.24  --   --  0.55  LATICACIDVEN  --   --   --   --   --   --   --   --  1.6 2.0*  --   INR  --   --   --  1.8*  --   --   --   --   --   --   --    < > = values in this interval not displayed.         Radiology Studies: CT ABDOMEN PELVIS W CONTRAST  Result Date: 08/15/2021 CLINICAL DATA:  Fever, abdominal pain EXAM: CT ABDOMEN AND PELVIS WITH CONTRAST TECHNIQUE: Multidetector CT imaging of the abdomen and pelvis was performed using the standard protocol following bolus administration of intravenous contrast. CONTRAST:  129mL OMNIPAQUE IOHEXOL 300 MG/ML  SOLN COMPARISON:  06/24/2021 FINDINGS: Lower chest: No acute abnormality. Unchanged elevation of the left hemidiaphragm. Hepatobiliary: No solid liver abnormality is seen. No gallstones, gallbladder wall thickening, or biliary dilatation. Pancreas: Unremarkable. No pancreatic ductal dilatation or surrounding inflammatory changes. Spleen: Normal in size without significant abnormality. Adrenals/Urinary Tract: Adrenal glands are unremarkable.  Kidneys are normal, without renal calculi, solid lesion, or hydronephrosis. Heterogeneous material within the dependent left urinary bladder, dependently hyperdense (series 3, image 73). Stomach/Bowel: Stomach is within normal limits. Appendix appears normal. No evidence of bowel wall thickening, distention, or inflammatory changes. Large burden of stool throughout the distal colon. Vascular/Lymphatic: Aortic atherosclerosis. No enlarged abdominal or pelvic lymph nodes. Reproductive: Status post hysterectomy. Other: No abdominal wall hernia. Anasarca. No abdominopelvic ascites. Musculoskeletal: No acute or significant osseous findings. IMPRESSION: 1. Heterogeneous material within the dependent left urinary bladder, dependently hyperdense. This may reflect some combination of excreted contrast with mixing artifact, clot, or small bladder stones/calcific debris. Endoluminal bladder mass is difficult to strictly exclude. Recommend delayed phase CT urogram to further evaluate. 2. Large burden of stool throughout the distal colon. 3. Anasarca. Aortic Atherosclerosis (ICD10-I70.0). Electronically Signed   By: Eddie Candle M.D.   On: 08/15/2021 12:50   VAS [place] UPPER EXTREMITY VENOUS DUPLEX  Result Date: 08/16/2021 UPPER VENOUS STUDY  Patient Name:  JALEIAH ASAY [facility]  Date of Exam:   08/15/2021 Medical Rec #: [id number]          Accession #:    [id number] Date of Birth: 09-18-1951          Patient Gender: F Patient Age:   88 years Exam Location:  [facility] Procedure:      VAS [place] UPPER EXTREMITY VENOUS DUPLEX Referring Phys: Deno Etienne [facility] --------------------------------------------------------------------------------  Indications: Fever of unknown origin Limitations: Inability to keep arm extended. Comparison Study: No prior study Performing Technologist: Sharion Dove RVS  Examination Guidelines: A complete evaluation includes B-mode imaging, spectral Doppler, color Doppler, and power Doppler as needed of all  accessible portions of each vessel. Bilateral testing is considered an integral part of a complete examination. Limited examinations for reoccurring indications may be performed as noted.  Right Findings: +----------+------------+---------+-----------+----------+---------------+ RIGHT     CompressiblePhasicitySpontaneousProperties    Summary     +----------+------------+---------+-----------+----------+---------------+ IJV  Full       Yes       Yes                              +----------+------------+---------+-----------+----------+---------------+ Subclavian               Yes       Yes                              +----------+------------+---------+-----------+----------+---------------+ Axillary      Full       Yes       Yes                              +----------+------------+---------+-----------+----------+---------------+ Brachial      Full       Yes       Yes                              +----------+------------+---------+-----------+----------+---------------+ Radial                                              patent by color +----------+------------+---------+-----------+----------+---------------+ Ulnar                                               Not visualized  +----------+------------+---------+-----------+----------+---------------+ Cephalic      Full                                                  +----------+------------+---------+-----------+----------+---------------+ Basilic                                             Not visualized  +----------+------------+---------+-----------+----------+---------------+  Left Findings: +----------+------------+---------+-----------+----------+-----------------+ LEFT      CompressiblePhasicitySpontaneousProperties     Summary      +----------+------------+---------+-----------+----------+-----------------+ IJV           Full       Yes       Yes                                 +----------+------------+---------+-----------+----------+-----------------+ Subclavian               Yes       Yes                                +----------+------------+---------+-----------+----------+-----------------+ Axillary                 Yes       Yes                                +----------+------------+---------+-----------+----------+-----------------+  Brachial      Full       Yes       Yes                                +----------+------------+---------+-----------+----------+-----------------+ Radial                                               patent by color  +----------+------------+---------+-----------+----------+-----------------+ Ulnar                                                patent by color  +----------+------------+---------+-----------+----------+-----------------+ Cephalic      None                                       Chronic      +----------+------------+---------+-----------+----------+-----------------+ Basilic       None                                  Age Indeterminate +----------+------------+---------+-----------+----------+-----------------+  Summary:  Right: No evidence of deep vein thrombosis in the upper extremity. No evidence of superficial vein thrombosis in the upper extremity.  Left: No evidence of deep vein thrombosis in the upper extremity. Findings consistent with age indeterminate superficial vein thrombosis involving the left basilic vein. 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  *See table(s) above for measurements and observations.  Diagnosing physician: Servando Snare MD Electronically signed by Servando Snare MD on 08/16/2021 at 4:45:02 PM.    Final      Scheduled Meds:  aspirin EC  81 mg Oral Daily   atorvastatin  40 mg Oral QHS   buPROPion  150 mg Oral Daily   clopidogrel  75 mg Oral Daily   docusate sodium  100 mg Oral BID   enoxaparin  (LOVENOX) injection  40 mg Subcutaneous Q24H   lactulose  30 g Oral TID   metoprolol succinate  50 mg Oral Daily   polyethylene glycol  17 g Oral BID   sodium chloride flush  3 mL Intravenous Q12H   topiramate  100 mg Oral BID   venlafaxine XR  37.5 mg Oral Daily   cyanocobalamin  1,000 mcg Oral Daily   Continuous Infusions:     LOS: 7 days    Time spent: 35 minutes  Signature  Lala Lund M.D on 08/17/2021 at 11:15 AM   -  To page go to [email or web address]

## 2021-08-17 NOTE — TOC Progression Note (Addendum)
Transition of Care [facility]) - Progression Note    Patient Details  Name: Michaela Rios MRN: [id number] Date of Birth: [birth year]  Transition of Care [facility]) CM/SW [place], LCSW Phone Number: 08/17/2021, 10:42 AM  Clinical Narrative:    10:42am-CSW received insurance denial for SNF. CSW provided peer to peer info to MD [phone], Ref# [id number]).   4pm-Denial overturned and insurance approved for [employer].    Expected Discharge Plan: Skilled Nursing Facility Barriers to Discharge: Insurance Authorization  Expected Discharge Plan and Services Expected Discharge Plan: [place] In-house Referral: Clinical Social Work   Post Acute Care Choice: [place] Living arrangements for the past 2 months: Single Family Home                                       Social Determinants of Health (SDOH) Interventions    Readmission Risk Interventions No flowsheet data found.

## 2021-08-18 ENCOUNTER — Inpatient Hospital Stay (HOSPITAL_COMMUNITY): Payer: Medicare HMO

## 2021-08-18 ENCOUNTER — Ambulatory Visit: Payer: Medicare HMO | Admitting: Physical Therapy

## 2021-08-18 ENCOUNTER — Ambulatory Visit: Payer: Medicare HMO | Admitting: Occupational Therapy

## 2021-08-18 DIAGNOSIS — I5030 Unspecified diastolic (congestive) heart failure: Secondary | ICD-10-CM

## 2021-08-18 LAB — ENA+DNA/DS+ANTICH+CENTRO+JO...
Anti JO-1: <0.2 AI (ref 0.0–0.9)
Centromere Ab Screen: <0.2 AI (ref 0.0–0.9)
Chromatin Ab SerPl-aCnc: 0.3 AI (ref 0.0–0.9)
ENA SM Ab Ser-aCnc: 1.2 AI — ABNORMAL HIGH (ref 0.0–0.9)
Ribonucleic Protein: 0.2 AI (ref 0.0–0.9)
SSA (Ro) (ENA) Antibody, IgG: <0.2 AI (ref 0.0–0.9)
SSB (La) (ENA) Antibody, IgG: <0.2 AI (ref 0.0–0.9)
Scleroderma (Scl-70) (ENA) Antibody, IgG: <0.2 AI (ref 0.0–0.9)
ds DNA Ab: 5 IU/mL (ref 0–9)

## 2021-08-18 LAB — COMPREHENSIVE METABOLIC PANEL
ALT: 10 U/L (ref 0–44)
AST: 36 U/L (ref 15–41)
Albumin: 1.9 g/dL — ABNORMAL LOW (ref 3.5–5.0)
Alkaline Phosphatase: 39 U/L (ref 38–126)
Anion gap: 10 (ref 5–15)
BUN: 31 mg/dL — ABNORMAL HIGH (ref 8–23)
CO2: 18 mmol/L — ABNORMAL LOW (ref 22–32)
Calcium: 8.2 mg/dL — ABNORMAL LOW (ref 8.9–10.3)
Chloride: 111 mmol/L (ref 98–111)
Creatinine, Ser: 1.07 mg/dL — ABNORMAL HIGH (ref 0.44–1.00)
GFR, Estimated: 56 mL/min — ABNORMAL LOW (ref >60)
Glucose, Bld: 133 mg/dL — ABNORMAL HIGH (ref 70–99)
Potassium: 4.1 mmol/L (ref 3.5–5.1)
Sodium: 139 mmol/L (ref 135–145)
Total Bilirubin: 0.3 mg/dL (ref 0.3–1.2)
Total Protein: 4.9 g/dL — ABNORMAL LOW (ref 6.5–8.1)

## 2021-08-18 LAB — CBC WITH DIFFERENTIAL/PLATELET
Abs Immature Granulocytes: 0 10*3/uL (ref 0.00–0.07)
Basophils Absolute: 0.1 10*3/uL (ref 0.0–0.1)
Basophils Relative: 1 %
Eosinophils Absolute: 0 10*3/uL (ref 0.0–0.5)
Eosinophils Relative: 0 %
HCT: 40.6 % (ref 36.0–46.0)
Hemoglobin: 13.3 g/dL (ref 12.0–15.0)
Lymphocytes Relative: 10 %
Lymphs Abs: 1.1 10*3/uL (ref 0.7–4.0)
MCH: 27.5 pg (ref 26.0–34.0)
MCHC: 32.8 g/dL (ref 30.0–36.0)
MCV: 84.1 fL (ref 80.0–100.0)
Monocytes Absolute: 0.5 10*3/uL (ref 0.1–1.0)
Monocytes Relative: 5 %
Neutro Abs: 9 10*3/uL — ABNORMAL HIGH (ref 1.7–7.7)
Neutrophils Relative %: 84 %
Platelets: 414 10*3/uL — ABNORMAL HIGH (ref 150–400)
RBC: 4.83 MIL/uL (ref 3.87–5.11)
RDW: 16.7 % — ABNORMAL HIGH (ref 11.5–15.5)
WBC: 10.7 10*3/uL — ABNORMAL HIGH (ref 4.0–10.5)
nRBC: 0 /100 WBC
nRBC: 0.8 % — ABNORMAL HIGH (ref 0.0–0.2)

## 2021-08-18 LAB — ANCA TITERS
Atypical P-ANCA titer: <1:20 {titer}
C-ANCA: <1:20 {titer}
P-ANCA: <1:20 {titer}

## 2021-08-18 LAB — MAGNESIUM: Magnesium: 2.1 mg/dL (ref 1.7–2.4)

## 2021-08-18 LAB — PROCALCITONIN: Procalcitonin: 1.14 ng/mL

## 2021-08-18 LAB — BETA-2-GLYCOPROTEIN I ABS, IGG/M/A
Beta-2 Glyco I IgG: <9 GPI IgG units (ref 0–20)
Beta-2-Glycoprotein I IgA: <9 GPI IgA units (ref 0–25)
Beta-2-Glycoprotein I IgM: <9 GPI IgM units (ref 0–32)

## 2021-08-18 LAB — URINE CULTURE: Culture: NO GROWTH

## 2021-08-18 LAB — D-DIMER, QUANTITATIVE: D-Dimer, Quant: 3.58 ug/mL-FEU — ABNORMAL HIGH (ref 0.00–0.50)

## 2021-08-18 LAB — TSH: TSH: 3.009 u[IU]/mL (ref 0.350–4.500)

## 2021-08-18 LAB — C-REACTIVE PROTEIN: CRP: 10.6 mg/dL — ABNORMAL HIGH (ref <1.0)

## 2021-08-18 LAB — T4, FREE: Free T4: 0.92 ng/dL (ref 0.61–1.12)

## 2021-08-18 LAB — ANA W/REFLEX IF POSITIVE: Anti Nuclear Antibody (ANA): POSITIVE — AB

## 2021-08-18 LAB — SURGICAL PATHOLOGY

## 2021-08-18 MED ORDER — METOPROLOL SUCCINATE ER 25 MG PO TB24: 25.0000 mg | ORAL_TABLET | Freq: Every day | ORAL | Status: DC

## 2021-08-18 MED ORDER — TOPIRAMATE 25 MG PO TABS
50.0000 mg | ORAL_TABLET | Freq: Two times a day (BID) | ORAL | Status: DC
  Administered 2021-08-18: 50 mg via ORAL
  Filled 2021-08-18: qty 2

## 2021-08-18 MED ORDER — SODIUM CHLORIDE 0.9 % IV BOLUS
500.0000 mL | Freq: Once | INTRAVENOUS | Status: AC
  Administered 2021-08-18: 500 mL via INTRAVENOUS

## 2021-08-18 NOTE — Progress Notes (Signed)
[facility] PROGRESS NOTE    Michaela Rios  NFA:[id number] DOB: 01-Feb-1951 DOA: 08/20/2021 PCP: Binnie Rail, MD      Brief Narrative:  Mrs. Tabron is a 70 y.o. F with HTN, CKD IIIa, and PVD as well as recent admission for stroke and FUO presented again with fever and weakness.    History of current illness begins late June (prior to which patient was independent, no dementia, lived with husband, normal with ADLs):  6/27: Initially presented w/ 1 week urinary symptoms, generalized weakness -> temp 101F, HR 140s in the ER, started on treatment for sepsis 6/29: Still encephalopathic out of proportion to expected for sepsis, CT head obtained, showed abnormal signal in BGs -> f/u MRI showed acute right BG stroke, acute left BG/thalamus/caudate T2 hyperintensity, neuro consulted, no focal weakness at that time 6/30: first mention of left sided weakness on exam; LP this date, culture negative, no WBC, Crypto neg, HSV PCR neg, protein elevated and 22 RBCs in tubes 1 and 4, not enough fluid for flow cytometry 7/2: Recurrent fever, ID consulted for FUO 7/2-7/5: Work up for systemic vasculitis included ANCA, complements, ANA, anti-dsDNA, Ro/La Abs, RF.  All negative except ANA+, CRP ~7.  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  The patient's fever resolved with stopping antibiotics.  She was treated with Solu-medrol 1g for 3 days.   7/5: Cerebral angiography was unremarkable. 7/12: She was discharged to CIR, where she progressed to the point of being interactive, ambulatory with a walker.  8/4: Returned home from [place] where she remained ambulatory, interactive for 1-2 weeks, then started to get worse. 9/4: MRI brain w/wo contrast on 9/4 showed evolution of right BG infarct, no change in L BG T2 hyperintensity, and new  acute/subacute infarcts in the left BG.  Around this time, the patient had worsening generalized weakness, worse PO intake, more somnolence and psychomotor slowing, and husband brought her back to the ER.     Subjective:   Nursing reports not eating much    Assessment & Plan:  Fever - off and on could be neurological,  but LLL Infilterate on 08/14/21, high risk for aspiration, SLP following, was given 5 days of Rocephin and Flagyl, CT scan abdomen pelvis with IV contrast was unremarkable, venous duplex of the lower and upper extremities shows no blood clot.  Still running nighttime fevers.  Per ID no clear signs of infection.   -monitor: appears to be trending down -smear pending -rheum follow up outpatient   Acute metabolic encephalopathy - Could be due to CVA, TSH normal, B12 normal.  She was on 2 central acting agents venlafaxine for anxiety and Topamax for migraines, Reduced Topamax, Taper and stop venlafaxine. -seems improved  Subacute strokes - Left caudate and putamen infarct embolic secondary to likely small vessel disease .  Prior history of multiple cryptogenic strokes in July 2022 with negative extensive work-up she does have a PFO but at her age she has a low rope score of 2 making PFO unlikely cause of her strokes. DW Dr Erlinda Hong 08/17/21 - CVA unlikely causing fevers. Seen by CVA team, ILR interrogated, this captured no events, Afib unlikely.  (The tachycardia noted on the report is consistent with her observed sinus tachycardia here), per CVA team -  Aspirin and Plavix for 3 weeks then Plavix alone, Continue new  atorvastatin, TCD confirms PFO, Leg [place] -ve for DVT.  Torticollis - due to CVA, much better.   Sinus tachycardia  She remains tachycardic  Hypertension  - BP normal, Continue  metoprolol   CKD IIIa - stable.  Chronic migraines - No headaches,  reduce Topamax in half  Anxiety - Taper and stop venlafaxine, Reduced bupropion   Hyponatremia Mild  asymptomatic  Hypokalemia -repleted  Anemia, normocytic Mild, asymptomatic  Lung nodule 75mm incidental nodule noted last admission.  Outpt Pulm follow up.  Severe stool burden with constipation.   -Placed on Bowel regimen.    PFO.  Cardiology outpatient follow-up for elective closure if family desires.    Disposition: Status is: Inpatient  Remains inpatient appropriate because:IV treatments appropriate due to intensity of illness or inability to take PO  Dispo: The patient is from: Home              Anticipated d/c is to: SNF              Patient currently is not medically stable to d/c.   Difficult to place patient No     MDM: The below labs and imaging reports were reviewed and summarized above.  Medication management as above.    DVT prophylaxis: enoxaparin (LOVENOX) injection 40 mg Start: 08/17/2021 1600  Code Status: FULL     Consultants:   ID Neurology  Procedures:   9/13 CXR clear 9/14 Lower extremity US DVT -- no DVT 9/14 transcranial doppler with bubbles -- TCD bubble study positive for large right-to-left shunt +ve grade 5 PFO - DW Dr Erlinda Hong 08/14/21 CT head 9/17 - non acute Venous US upper extremities - no DVT CT abd Pelvis IV Contrast - constipation   Antimicrobials:   Vancomycin, flagyl x1 on 9/13 Aztreonam 9/13 >> 9/14 Rocephin    9/15 >> 9/19 Flagyl 9/15>>9/19  Culture data:    9/13 blood culture -- ngtd 9/13 urine culture -- <30K colonies klebnsiella   Objective: Vitals:   08/18/21 0400 08/18/21 0700 08/18/21 0801 08/18/21 1231  BP: 99/85 (!) 112/96 (!) 116/95 (!) 115/93  Pulse: (!) 109 (!) 112 (!) 108 (!) 110  Resp: 20 14 20 20   Temp: 99 F (37.2 C)  98.9 F (37.2 C) 97.6 F (36.4 C)  TempSrc: Oral  Axillary Axillary  SpO2: 95% 97% 96% 99%  Weight:      Height:        Intake/Output Summary (Last 24 hours) at 08/18/2021 1603 Last data filed at 08/18/2021 0900 Gross per 24 hour  Intake --  Output 100 ml  Net -100 ml   Filed  Weights   08/11/21 2312 08/14/21 0400 08/15/21 0455  Weight: 54.5 kg 55.1 kg 57 kg    Examination:   General: Appearance:    Chronically ill appearing female in no acute distress     Lungs:      respirations unlabored  Heart:    Tachycardic.   MS:   All extremities are intact.    Neurologic:   Awake, alert       Data Reviewed: I have personally reviewed following labs and imaging studies:  Recent Labs  Lab 08/14/21 0716 08/15/21 0055 08/16/21 0249 08/17/21 0505 08/18/21 0059  WBC 10.3 11.2* 10.3 13.4* 10.7*  HGB 11.5* 11.6* 12.2 12.4 13.3  HCT 35.2* 35.3* 38.0 39.1 40.6  PLT 381 391 413* 435* 414*  MCV 84.6 84.0 83.7 85.0 84.1  MCH 27.6 27.6 26.9 27.0 27.5  MCHC 32.7 32.9  32.1 31.7 32.8  RDW 16.1* 16.3* 16.2* 16.3* 16.7*  LYMPHSABS  --  0.6* 0.7 0.8 1.1  MONOABS  --  0.6 0.4 0.5 0.5  EOSABS  --  0.2 0.3 0.1 0.0  BASOSABS  --  0.0 0.0 0.0 0.1    Recent Labs  Lab 08/14/21 0138 08/14/21 0716 08/15/21 0055 08/15/21 0816 08/16/21 0249 08/16/21 0713 08/16/21 1015 08/17/21 0505 08/18/21 0059  NA 135  --  137  --  140  --   --  138 139  K 3.9  --  3.9  --  3.8  --   --  3.5 4.1  CL 107  --  109  --  112*  --   --  108 111  CO2 18*  --  19*  --  19*  --   --  20* 18*  GLUCOSE 114*  --  101*  --  109*  --   --  135* 133*  BUN 32*  --  26*  --  20  --   --  23 31*  CREATININE 0.91  --  0.77  --  0.69  --   --  0.85 1.07*  CALCIUM 8.6*  --  8.2*  --  8.2*  --   --  8.2* 8.2*  AST  --   --  24  --  26  --   --  31 36  ALT  --   --  11  --  11  --   --  12 10  ALKPHOS  --   --  41  --  41  --   --  38 39  BILITOT  --   --  0.6  --  0.5  --   --  0.4 0.3  ALBUMIN  --   --  2.0*  --  1.9*  --   --  1.8* 1.9*  MG  --   --  1.9  --  1.7  --   --  1.8 2.1  CRP 8.6*  --  12.0*  --  10.9*  --   --  9.8* 10.6*  DDIMER  --   --   --  3.22* 3.67*  --   --  4.10* 3.58*  PROCALCITON  --  0.21 0.21  --  0.24  --   --  0.55 1.14  LATICACIDVEN  --   --   --   --   --  1.6  2.0*  --   --   INR 1.8*  --   --   --   --   --   --   --   --   TSH  --   --   --   --   --   --   --   --  3.009         Radiology Studies: CT CHEST WO CONTRAST  Result Date: 08/17/2021 CLINICAL DATA:  Left lower lobe opacity on recent chest radiograph. History of strokes. Elevated white blood cell count. Pneumonia. EXAM: CT CHEST WITHOUT CONTRAST TECHNIQUE: Multidetector CT imaging of the chest was performed following the standard protocol without IV contrast. COMPARISON:  Plain film 08/15/2021 and CTA chest 05/24/2021 FINDINGS: Cardiovascular: Aortic atherosclerosis. Tortuous thoracic aorta. Normal heart size, without pericardial effusion. Mediastinum/Nodes: No mediastinal or definite hilar adenopathy, given limitations of unenhanced CT. Surgical changes at the gastroesophageal junction. Lungs/Pleura: New trace pleural fluid since the prior CT. Patent airways. Motion degradation  in the lung bases. Left lower lobe minimal volume loss and compressive dependent atelectasis. Subpleural right middle lobe 4 mm nodule on 98/6 is similar to on the 12/03/2004 exam, most likely a subpleural lymph node. Upper Abdomen: Normal imaged portions of the liver, spleen, stomach, pancreas, gallbladder. Musculoskeletal: No acute osseous abnormality. IMPRESSION: 1. Mildly motion degraded exam. 2. Trace left pleural fluid with adjacent volume loss and dependent atelectasis. No evidence of pneumonia. 3.  Aortic Atherosclerosis (ICD10-I70.0). Electronically Signed   By: Abigail Miyamoto M.D.   On: 08/17/2021 13:52   CT Angio Chest Pulmonary Embolism (PE) W or WO Contrast  Result Date: 08/17/2021 CLINICAL DATA:  PE suspected, high probability EXAM: CT ANGIOGRAPHY CHEST WITH CONTRAST TECHNIQUE: Multidetector CT imaging of the chest was performed using the standard protocol during bolus administration of intravenous contrast. Multiplanar CT image reconstructions and MIPs were obtained to evaluate the vascular anatomy. CONTRAST:   169mL OMNIPAQUE IOHEXOL 350 MG/ML SOLN COMPARISON:  CTA chest 05/24/2021, correlation is also made with CT chest without contrast 08/17/2021 FINDINGS: Cardiovascular: Satisfactory opacification of the pulmonary arteries to the segmental level. No evidence of pulmonary embolism. Normal heart size. No pericardial effusion. Aortic atherosclerosis. Mediastinum/Nodes: No enlarged mediastinal, hilar, or axillary lymph nodes. Thyroid gland, trachea, and esophagus demonstrate no significant findings. Lungs/Pleura: Trace left-greater-than-right pleural fluid, with minimal associated atelectasis. Unchanged 4 mm nodule in the right middle lobe (series 15, image 262), which has been stable over multiple prior exams and may represent a subpleural lymph node. Upper Abdomen: No acute abnormality. High-density material within the stomach was present on the noncontrast exam and likely represents ingested material. Musculoskeletal: No acute osseous abnormality. Review of the MIP images confirms the above findings. IMPRESSION: 1. Negative for pulmonary embolism. 2. Redemonstrated trace left-greater-than-right pleural fluid with associated atelectasis. 3.  Aortic Atherosclerosis (ICD10-I70.0). Electronically Signed   By: Merilyn Baba M.D.   On: 08/17/2021 19:41     Scheduled Meds:  aspirin EC  81 mg Oral Daily   atorvastatin  40 mg Oral QHS   buPROPion  150 mg Oral Daily   clopidogrel  75 mg Oral Daily   docusate sodium  100 mg Oral BID   enoxaparin (LOVENOX) injection  40 mg Subcutaneous Q24H   metoprolol succinate  50 mg Oral Daily   polyethylene glycol  17 g Oral BID   sodium chloride flush  3 mL Intravenous Q12H   topiramate  100 mg Oral BID   cyanocobalamin  1,000 mcg Oral Daily   Continuous Infusions:     LOS: 8 days    Time spent: 35 minutes  Signature  Geradine Girt DOon 08/18/2021 at 4:03 PM   -  To page go to [email or web address]

## 2021-08-18 NOTE — Progress Notes (Addendum)
Physical Therapy Treatment Patient Details Name: Michaela Rios MRN: [id number] DOB: November 22, 1951 Today's Date: 08/18/2021   History of Present Illness Pt is a 70yo female presenting to [facility] ED on 9/13 with weakness, poor PO inntake, found to be febrile in ED. Workup for sepsis, unclear etiology. Pt with admission July of 2022 for CVA. Per neuro notes, acute/subacute left posterior putamen and caudate head on MRI on 9/4.PMH: CVA 05/2021,  HTN, CKD3, migraines, anxiety, depression.    PT Comments    Pt making slow progress today.  Improved alertness with participation with increased time.  Mod A x 2 to EOB but then max x 2 pivot to chair.  Pt did have drop in BP with sitting but improved after 3 mins.  Continue to progress as able.  Family asking about getting pt to the chair daily - notified nursing and maximove pad in room.     Recommendations for follow up therapy are one component of a multi-disciplinary discharge planning process, led by the attending physician.  Recommendations may be updated based on patient status, additional functional criteria and insurance authorization.  Follow Up Recommendations  Supervision/Assistance - 24 hour;SNF     Equipment Recommendations  3in1 (PT);Wheelchair cushion (measurements PT);Wheelchair (measurements PT)    Recommendations for Other Services       Precautions / Restrictions Precautions Precautions: Fall     Mobility  Bed Mobility Overal bed mobility: Needs Assistance Bed Mobility: Supine to Sit     Supine to sit: Mod assist;+2 for physical assistance     General bed mobility comments: Pt assisting with sliding legs to EOB and reaching for rails and cues and assist; mod A to lift trunk and scoot forward    Transfers Overall transfer level: Needs assistance Equipment used: 2 person hand held assist Transfers: Sit to/from Stand;Stand Pivot Transfers Sit to Stand: +2 physical assistance;Max assist Stand pivot transfers: Max  assist;+2 physical assistance       General transfer comment: Stood from bed with use of gait belt, bed pad, and pt holding therapist arms.  Pivoted/shuffled feet to chair with max x 2.  Ambulation/Gait             General Gait Details: unable   Stairs             Wheelchair Mobility    Modified Rankin (Stroke Patients Only) Modified Rankin (Stroke Patients Only) Pre-Morbid Rankin Score: Moderately severe disability Modified Rankin: Severe disability     Balance Overall balance assessment: Needs assistance Sitting-balance support: Feet supported;Bilateral upper extremity supported Sitting balance-Leahy Scale: Poor Sitting balance - Comments: Pt with poor trunk control/weak.  Tending to lean L and back but not pushing.  Sat EOB for 10-15 mins -worked on weight shifting , leaning down onto R elbow, sitting up straight, and moving hands to knees to promote forward lean.  Pt participating with verbal cues.   Standing balance support: Bilateral upper extremity supported;During functional activity Standing balance-Leahy Scale: Zero Standing balance comment: Requring max A                            Cognition Arousal/Alertness: Awake/alert Behavior During Therapy: Flat affect Overall Cognitive Status: Impaired/Different from baseline                                 General Comments: Increased time to respond but responds correctly and  smiles at jokes      Exercises General Exercises - Lower Extremity Quad Sets: AROM;Both;5 reps;Supine Long Arc Quad: AROM;Right;AAROM;Left;5 reps;Seated Heel Slides: AAROM;Both;5 reps;Supine Hip ABduction/ADduction: AAROM;Both;5 reps;Supine Other Exercises: Heel cord stretch 30 sec x 3 bil Other Exercises: For all LE exercises required more A on L compared to right; quad sets fatigued easily on L    General Comments General comments (skin integrity, edema, etc.): HR up to 130 bpm max with sitting.  BP was  113/91 in supine, dropped to 89/77 in sitting with mild dizziness, back to 113/78 after 3 mins and symptoms improving.      Pertinent Vitals/Pain Pain Assessment: No/denies pain    Home Living                      Prior Function            PT Goals (current goals can now be found in the care plan section) Progress towards PT goals: Progressing toward goals    Frequency    Min 3X/week      PT Plan Current plan remains appropriate    Co-evaluation PT/OT/SLP Co-Evaluation/Treatment: Yes Reason for Co-Treatment: For patient/therapist safety (no tech time available) PT goals addressed during session: Mobility/safety with mobility;Strengthening/ROM        AM-PAC PT "6 Clicks" Mobility   Outcome Measure  Help needed turning from your back to your side while in a flat bed without using bedrails?: Total Help needed moving from lying on your back to sitting on the side of a flat bed without using bedrails?: Total Help needed moving to and from a bed to a chair (including a wheelchair)?: Total Help needed standing up from a chair using your arms (e.g., wheelchair or bedside chair)?: Total Help needed to walk in hospital room?: Total Help needed climbing 3-5 steps with a railing? : Total 6 Click Score: 6    End of Session Equipment Utilized During Treatment: Gait belt Activity Tolerance: Patient tolerated treatment well Patient left: with chair alarm set;in chair;with call bell/phone within reach (maximove pad in palce) Nurse Communication: Mobility status;Need for lift equipment PT Visit Diagnosis: Other symptoms and signs involving the nervous system (R29.898);Muscle weakness (generalized) (M62.81);Unsteadiness on feet (R26.81)     Time: 7711-6579 PT Time Calculation (min) (ACUTE ONLY): 29 min  Charges:  $Neuromuscular Re-education: 8-22 mins                     Abran Richard, PT Acute Rehab Services Pager [phone] [facility] [phone]    Karlton Lemon 08/18/2021, 12:14 PM

## 2021-08-18 NOTE — Progress Notes (Signed)
Occupational Therapy Treatment Patient Details Name: Michaela Rios MRN: [id number] DOB: [birth year] Today's Date: 08/18/2021   History of present illness Pt is a 70yo female presenting to [facility] ED on 9/13 with weakness, poor PO inntake, found to be febrile in ED. Workup for sepsis, unclear etiology. Pt with admission July of 2022 for CVA. Per neuro notes, acute/subacute left posterior putamen and caudate head on MRI on 9/4.PMH: CVA 05/2021,  HTN, CKD3, migraines, anxiety, depression.   OT comments  Patient was co-treat with PT for patient and therapist safety.  Patient received in bed and was able to respond to questions and required cues for orientation. Patient was mod x2 for supine to sitting on eob and max assist x2 for transfer to recliner using gait belt, bed pad, and underarm assist.  Patient to continue to receive skilled OT in acute care setting.    Recommendations for follow up therapy are one component of a multi-disciplinary discharge planning process, led by the attending physician.  Recommendations may be updated based on patient status, additional functional criteria and insurance authorization.    Follow Up Recommendations  SNF;Supervision/Assistance - 24 hour    Equipment Recommendations       Recommendations for Other Services      Precautions / Restrictions Precautions Precautions: Fall       Mobility Bed Mobility Overal bed mobility: Needs Assistance Bed Mobility: Supine to Sit     Supine to sit: Mod assist;+2 for physical assistance     General bed mobility comments: Pt assisting with sliding legs to EOB and reaching for rails and cues and assist; mod A to lift trunk and scoot forward    Transfers Overall transfer level: Needs assistance Equipment used: 2 person hand held assist Transfers: Sit to/from Stand;Stand Pivot Transfers Sit to Stand: +2 physical assistance;Max assist Stand pivot transfers: Max assist;+2 physical assistance       General  transfer comment: Stood from bed with use of gait belt, bed pad, and pt holding therapist arms.  Pivoted/shuffled feet to chair with max x 2.    Balance Overall balance assessment: Needs assistance Sitting-balance support: Feet supported;Bilateral upper extremity supported Sitting balance-Leahy Scale: Poor Sitting balance - Comments: Pt with poor trunk control/weak.  Tending to lean L and back but not pushing.  Sat EOB for 10-15 mins -worked on weight shifting , leaning down onto R elbow, sitting up straight, and moving hands to knees to promote forward lean.  Pt participating with verbal cues.   Standing balance support: Bilateral upper extremity supported;During functional activity Standing balance-Leahy Scale: Zero Standing balance comment: Requring max A                           ADL either performed or assessed with clinical judgement   ADL                                               Vision       Perception     Praxis      Cognition Arousal/Alertness: Awake/alert Behavior During Therapy: Flat affect Overall Cognitive Status: Impaired/Different from baseline                                 General Comments: Increased time  to respond but responds correctly and smiles at jokes        Exercises Exercises: General Upper Extremity General Exercises - Upper Extremity Shoulder Extension: AROM;AAROM;Both;10 reps General Exercises - Lower Extremity Quad Sets: AROM;Both;5 reps;Supine Long Arc Quad: AROM;Right;AAROM;Left;5 reps;Seated Heel Slides: AAROM;Both;5 reps;Supine Hip ABduction/ADduction: AAROM;Both;5 reps;Supine Other Exercises Other Exercises: required AAROM for BUE shoulder flexion and was able to perform 3 reps with AROM.  Grip strength addressed with hand squeezes   Shoulder Instructions       General Comments HR up to 130 bpm max with sitting.  BP was 113/91 in supine, dropped to 89/77 in sitting with mild  dizziness, back to 113/78 after 3 mins and symptoms improving.    Pertinent Vitals/ Pain       Pain Assessment: No/denies pain  Home Living                                          Prior Functioning/Environment              Frequency  Min 2X/week        Progress Toward Goals  OT Goals(current goals can now be found in the care plan section)  Progress towards OT goals: Progressing toward goals  Acute Rehab OT Goals Patient Stated Goal: not stated OT Goal Formulation: With patient Time For Goal Achievement: 08/26/21 Potential to Achieve Goals: Fair ADL Goals Pt Will Perform Eating: with set-up;sitting Pt Will Perform Grooming: with set-up;sitting Pt Will Transfer to Toilet: with min assist;stand pivot transfer Additional ADL Goal #1: Pt will follow 2-3step commands consistently during ADL/IADL.  Plan Discharge plan remains appropriate    Co-evaluation    PT/OT/SLP Co-Evaluation/Treatment: Yes Reason for Co-Treatment: For patient/therapist safety;Complexity of the patient's impairments (multi-system involvement) PT goals addressed during session: Mobility/safety with mobility;Strengthening/ROM OT goals addressed during session: Strengthening/ROM      AM-PAC OT "6 Clicks" Daily Activity     Outcome Measure   Help from another person eating meals?: Total Help from another person taking care of personal grooming?: Total Help from another person toileting, which includes using toliet, bedpan, or urinal?: Total Help from another person bathing (including washing, rinsing, drying)?: Total Help from another person to put on and taking off regular upper body clothing?: Total Help from another person to put on and taking off regular lower body clothing?: Total 6 Click Score: 6    End of Session Equipment Utilized During Treatment: Gait belt  OT Visit Diagnosis: Other abnormalities of gait and mobility (R26.89);Muscle weakness (generalized)  (M62.81);Other symptoms and signs involving cognitive function   Activity Tolerance Patient tolerated treatment well;Patient limited by fatigue   Patient Left in chair;with call bell/phone within reach;with chair alarm set   Nurse Communication Other (comment);Need for lift equipment (recommended having patient sit up for an hour or more per her tolerance)        Time: 1130-1159 OT Time Calculation (min): 29 min  Charges: OT General Charges $OT Visit: 1 Visit OT Treatments $Therapeutic Exercise: 8-22 mins  Lodema Hong, OTA   Trixie Dredge 08/18/2021, 12:49 PM

## 2021-08-18 NOTE — Therapy (Signed)
[facility] [address] [place], [place], [address] Phone: [phone]   Fax:  [phone]  August 18, 2021   No Recipients  Physical Therapy Discharge Summary  Patient: Michaela Rios  MRN: [id number]  Date of Birth: 11-08-1951   Diagnosis: Muscle weakness (generalized)  Hemiplegia and hemiparesis following cerebral infarction affecting left non-dominant side [facility])  Other abnormalities of gait and mobility Referring Provider (PT): Lauraine Rinne, PA-C   Fenna had been seen in physical therapy for 2 times since her initial evaluation. Patient was recently hospitalized for progressive weakness. Due to medical status change, she will be discharged from current course of skilled physical therapy. Spoke to her husband today at 8:55am to discuss POC and her husband verbally agreed with discharge POC from outpatient PT at this time.  No Goals Met    Sincerely,   Kerrie Pleasure, PT   CC No Recipients  [facility] [address] [place], [place], [address] Phone: [phone]   Fax:  [phone]  Patient: [name]  MRN: [id number]  Date of Birth: [birth year]

## 2021-08-19 ENCOUNTER — Inpatient Hospital Stay (HOSPITAL_COMMUNITY): Payer: Medicare HMO

## 2021-08-19 DIAGNOSIS — I959 Hypotension, unspecified: Secondary | ICD-10-CM

## 2021-08-19 DIAGNOSIS — Z8673 Personal history of transient ischemic attack (TIA), and cerebral infarction without residual deficits: Secondary | ICD-10-CM

## 2021-08-19 DIAGNOSIS — Q211 Atrial septal defect: Secondary | ICD-10-CM

## 2021-08-19 DIAGNOSIS — R000 Tachycardia, unspecified: Secondary | ICD-10-CM

## 2021-08-19 LAB — BASIC METABOLIC PANEL
Anion gap: 12 (ref 5–15)
BUN: 46 mg/dL — ABNORMAL HIGH (ref 8–23)
CO2: 15 mmol/L — ABNORMAL LOW (ref 22–32)
Calcium: 8.1 mg/dL — ABNORMAL LOW (ref 8.9–10.3)
Chloride: 112 mmol/L — ABNORMAL HIGH (ref 98–111)
Creatinine, Ser: 1.7 mg/dL — ABNORMAL HIGH (ref 0.44–1.00)
GFR, Estimated: 32 mL/min — ABNORMAL LOW (ref >60)
Glucose, Bld: 126 mg/dL — ABNORMAL HIGH (ref 70–99)
Potassium: 5.1 mmol/L (ref 3.5–5.1)
Sodium: 139 mmol/L (ref 135–145)

## 2021-08-19 LAB — URINALYSIS, ROUTINE W REFLEX MICROSCOPIC
Glucose, UA: NEGATIVE mg/dL
Ketones, ur: NEGATIVE mg/dL
Nitrite: NEGATIVE
Protein, ur: NEGATIVE mg/dL
Specific Gravity, Urine: 1.02 (ref 1.005–1.030)
pH: 5.5 (ref 5.0–8.0)

## 2021-08-19 LAB — CULTURE, BLOOD (ROUTINE X 2)
Culture: NO GROWTH
Culture: NO GROWTH
Special Requests: ADEQUATE

## 2021-08-19 LAB — URINALYSIS, MICROSCOPIC (REFLEX): Bacteria, UA: NONE SEEN

## 2021-08-19 LAB — CBC
HCT: 45 % (ref 36.0–46.0)
Hemoglobin: 14.6 g/dL (ref 12.0–15.0)
MCH: 27.3 pg (ref 26.0–34.0)
MCHC: 32.4 g/dL (ref 30.0–36.0)
MCV: 84.3 fL (ref 80.0–100.0)
Platelets: 469 10*3/uL — ABNORMAL HIGH (ref 150–400)
RBC: 5.34 MIL/uL — ABNORMAL HIGH (ref 3.87–5.11)
RDW: 17.7 % — ABNORMAL HIGH (ref 11.5–15.5)
WBC: 20.2 10*3/uL — ABNORMAL HIGH (ref 4.0–10.5)
nRBC: 6.5 % — ABNORMAL HIGH (ref 0.0–0.2)

## 2021-08-19 LAB — ECHOCARDIOGRAM COMPLETE
AR max vel: 3.65 cm2
AV Area VTI: 4.63 cm2
AV Area mean vel: 2.93 cm2
AV Mean grad: 3 mmHg
AV Peak grad: 5.3 mmHg
Ao pk vel: 1.15 m/s
Height: 66 in
S' Lateral: 1.5 cm
Single Plane A4C EF: 72.6 %
Weight: 2010.6 oz

## 2021-08-19 LAB — CORTISOL: Cortisol, Plasma: 89.4 ug/dL

## 2021-08-19 LAB — LACTIC ACID, PLASMA: Lactic Acid, Venous: 4.9 mmol/L (ref 0.5–1.9)

## 2021-08-19 LAB — PROCALCITONIN: Procalcitonin: 1.8 ng/mL

## 2021-08-19 LAB — D-DIMER, QUANTITATIVE: D-Dimer, Quant: 3.33 ug/mL-FEU — ABNORMAL HIGH (ref 0.00–0.50)

## 2021-08-19 LAB — PARASITE EXAM, BLOOD

## 2021-08-19 LAB — PATHOLOGIST SMEAR REVIEW: Path Review: INCREASED

## 2021-08-19 IMAGING — DX DG CHEST 1V PORT
1 series · 1 of 1 positions shown · non-contrast
Comparison: Chest radiograph dated [DATE].

CLINICAL DATA: Pneumonia.

EXAM:
PORTABLE CHEST 1 VIEW

[chest ap]
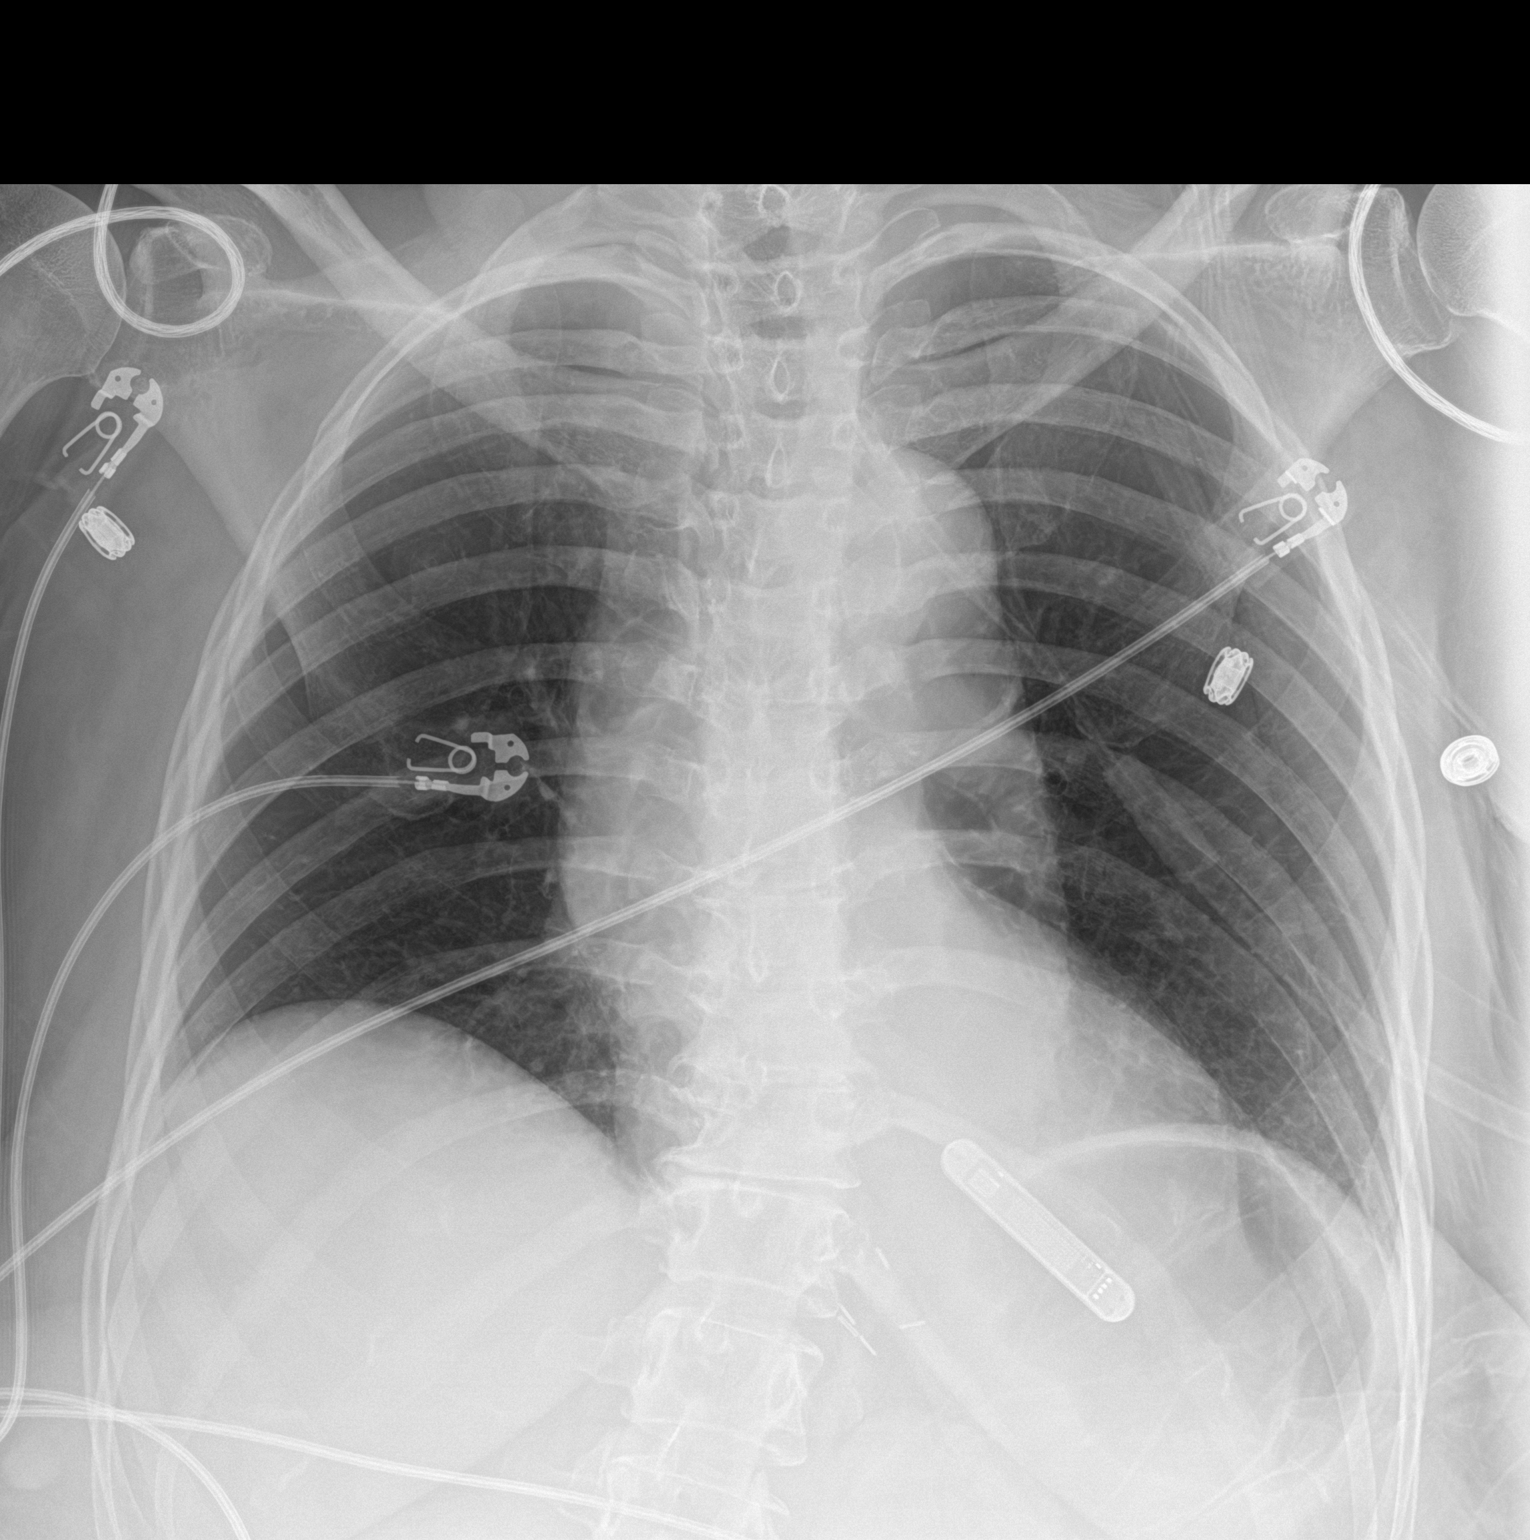

[1 of 1 positions shown; findings below may reference images not displayed]

FINDINGS: No focal consolidation, pleural effusion, pneumothorax. Stable
cardiac silhouette. No acute osseous pathology. Cardiac monitor
device.
IMPRESSION: No active disease.

## 2021-08-19 MED ORDER — METHOCARBAMOL 1000 MG/10ML IJ SOLN
500.0000 mg | Freq: Once | INTRAVENOUS | Status: DC
  Filled 2021-08-19: qty 5

## 2021-08-19 MED ORDER — SODIUM CHLORIDE 0.9 % IV SOLN: INTRAVENOUS | Status: DC

## 2021-08-19 MED ORDER — BISACODYL 10 MG RE SUPP: 10.0000 mg | Freq: Every day | RECTAL | Status: DC | PRN

## 2021-08-19 MED ORDER — SODIUM CHLORIDE 0.9 % IV BOLUS
500.0000 mL | Freq: Once | INTRAVENOUS | Status: AC
  Administered 2021-08-19: 500 mL via INTRAVENOUS

## 2021-08-19 MED ORDER — SODIUM CHLORIDE 0.9 % IV BOLUS
250.0000 mL | Freq: Once | INTRAVENOUS | Status: AC
  Administered 2021-08-19: 250 mL via INTRAVENOUS

## 2021-08-19 MED ORDER — LACTATED RINGERS IV BOLUS
500.0000 mL | Freq: Once | INTRAVENOUS | Status: AC
  Administered 2021-08-19: 500 mL via INTRAVENOUS

## 2021-08-19 MED ORDER — SODIUM CHLORIDE 0.9 % IV SOLN
1.0000 g | INTRAVENOUS | Status: DC
  Administered 2021-08-19: 1 g via INTRAVENOUS
  Filled 2021-08-19: qty 10

## 2021-08-19 MED ORDER — LACTATED RINGERS IV BOLUS: 500.0000 mL | Freq: Once | INTRAVENOUS | Status: DC

## 2021-08-19 MED ORDER — SODIUM CHLORIDE 0.9% FLUSH: 10.0000 mL | INTRAVENOUS | Status: DC | PRN

## 2021-08-19 MED ORDER — ENOXAPARIN SODIUM 30 MG/0.3ML IJ SOSY
30.0000 mg | PREFILLED_SYRINGE | INTRAMUSCULAR | Status: DC
  Administered 2021-08-19 – 2021-08-20 (×2): 30 mg via SUBCUTANEOUS
  Filled 2021-08-19 (×2): qty 0.3

## 2021-08-19 NOTE — Progress Notes (Signed)
PIV consult: Request for second site for pt with status change/hypotension. Arrived to room, phlebotomy was having difficulty drawing labs. BUE noted. Bruising noted to L AC. L brachial midline placed in upper arm, labs drawn. Site will likely NOT continue to draw labs. Please consider central line for prolonged infusions/ access for labs.

## 2021-08-19 NOTE — Progress Notes (Signed)
Spoke with on call provider concerning patient's status.  Patient currently having low urine output of only 50cc at this time.  BP is in the 80s and she is currently having a HR sustaining in the 130s.  Bladder scan done with a result of 120cc.  Patient alert and oriented x4 but has a flat affect and sometimes refuses to answer questions.  Questionable if the patient was hearing me but she has stated she "can hear" but still resistant to speak at times.  She has refused any oral intake during my shift.

## 2021-08-19 NOTE — Progress Notes (Signed)
[facility] PROGRESS NOTE    ANTONIQUE LANGFORD  WGN:[id number] DOB: [birth year] DOA: 08/18/2021 PCP: Binnie Rail, MD      Brief Narrative:  Mrs. Michaela Rios is a 70 y.o. F with HTN, CKD IIIa, and PVD as well as recent admission for stroke and FUO presented again with fever and weakness.    History of current illness begins late June (prior to which patient was independent, no dementia, lived with husband, normal with ADLs):  6/27: Initially presented w/ 1 week urinary symptoms, generalized weakness -> temp 101F, HR 140s in the ER, started on treatment for sepsis 6/29: Still encephalopathic out of proportion to expected for sepsis, CT head obtained, showed abnormal signal in BGs -> f/u MRI showed acute right BG stroke, acute left BG/thalamus/caudate T2 hyperintensity, neuro consulted, no focal weakness at that time 6/30: first mention of left sided weakness on exam; LP this date, culture negative, no WBC, Crypto neg, HSV PCR neg, protein elevated and 22 RBCs in tubes 1 and 4, not enough fluid for flow cytometry 7/2: Recurrent fever, ID consulted for FUO 7/2-7/5: Work up for systemic vasculitis included ANCA, complements, ANA, anti-dsDNA, Ro/La Abs, RF.  All negative except ANA+, CRP ~7.  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  The patient's fever resolved with stopping antibiotics.  She was treated with Solu-medrol 1g for 3 days.   7/5: Cerebral angiography was unremarkable. 7/12: She was discharged to CIR, where she progressed to the point of being interactive, ambulatory with a walker.  8/4: Returned home from [place] where she remained ambulatory, interactive for 1-2 weeks, then started to get worse. 9/4: MRI brain w/wo contrast on 9/4 showed evolution of right BG infarct, no change in L BG T2 hyperintensity, and new  acute/subacute infarcts in the left BG.  Around this time, the patient had worsening generalized weakness, worse PO intake, more somnolence and psychomotor slowing, and husband brought her back to the ER.     Subjective:   Still not eating, having issues with BP and HR    Assessment & Plan:  Fever - off and on could be neurological -had a  LLL Infilterate on 08/14/21: she is high risk for aspiration, SLP following-- given 5 days of Rocephin and Flagyl -CT scan abdomen pelvis with IV contrast was unremarkable, venous duplex of the lower and upper extremities shows no blood clot.    Per ID no clear signs of infection but patient still having fever  -recheck urine- start rocephin again -smear pending -flow cytometry pending -rheum follow up outpatient   Acute metabolic encephalopathy - Could be due to CVA - TSH normal, B12 normal.  She was on 2 central acting agents venlafaxine for anxiety and Topamax for migraines, Reduced Topamax, Taper and stop venlafaxine. -? Dehydration or infection from unclear source -discussed with neurology, less likely to be neurologic as CVAs were small  Subacute strokes - Left caudate and putamen infarct embolic secondary to likely small vessel disease .  Prior history of multiple cryptogenic strokes in July 2022 with negative extensive work-up she does have a PFO but at her age she has a low rope score of 2 making PFO unlikely cause of her strokes. DW Dr Erlinda Hong 08/17/21 - CVA unlikely causing fevers. Seen by CVA team, ILR interrogated, this captured no events, Afib unlikely.  (The tachycardia noted  on the report is consistent with her observed sinus tachycardia here), per CVA team -  Aspirin and Plavix for 3 weeks then Plavix alone, Continue new atorvastatin, TCD confirms PFO, Leg [place] -ve for DVT.  Torticollis  - per Dr. Candiss Norse thought to be due to CVA, intermittent    Sinus tachycardia  She remains tachycardic, despite abx and IVF  Hypertension   -now with  episodes of hypotension, hold BB and give IVF-- BP has been responding to IVF -cortisol elevated so does not appear to have adrenal insuff.   AKI on CKD IIIa  -strict I/O -getting IVF -suspect pre-renal  Chronic migraines - No headaches,  reduce Topamax in half on 9/21  Anxiety - Taper and stop venlafaxine, Reduced bupropion   Hypokalemia -repleted  Anemia, normocytic Mild, asymptomatic  Lung nodule  -30mm incidental nodule noted last admission.  Outpt Pulm follow up.  Severe stool burden with constipation.   -Placed on Bowel regimen.    PFO.  Cardiology outpatient follow-up for elective closure if family desires.   Long discussion with husband regarding GOC.  He says that he has not noticed improvement since she has been admitted.  Will continue to discuss this with him and discuss her wishes, currently he wants to treat all that can be treated for now.  Will discuss NG tube if continues to not eat.  Will also discuss with palliative care in the AM.   Disposition: Status is: Inpatient  Remains inpatient appropriate because:IV treatments appropriate due to intensity of illness or inability to take PO  Dispo: The patient is from: Home              Anticipated d/c is to: SNF              Patient currently is not medically stable to d/c.   Difficult to place patient No      DVT prophylaxis: enoxaparin (LOVENOX) injection 30 mg Start: 08/19/21 1600  Code Status: FULL     Consultants:   ID Neurology  Procedures:   9/13 CXR clear 9/14 Lower extremity US DVT -- no DVT 9/14 transcranial doppler with bubbles -- TCD bubble study positive for large right-to-left shunt +ve grade 5 PFO - DW Dr Erlinda Hong 08/14/21 CT head 9/17 - non acute Venous US upper extremities - no DVT CT abd Pelvis IV Contrast - constipation   Antimicrobials:   Vancomycin, flagyl x1 on 9/13 Aztreonam 9/13 >> 9/14 Rocephin    9/15 >> 9/19 Flagyl 9/15>>9/19     Objective: Vitals:   08/19/21 1345  08/19/21 1430 08/19/21 1518 08/19/21 1530  BP: 97/63 97/77 (!) 99/58 100/82  Pulse: (!) 136     Resp: (!) 24 17 18  (!) 32  Temp:    (!) 100.5 F (38.1 C)  TempSrc:    Axillary  SpO2: 99% 100%  92%  Weight:      Height:        Intake/Output Summary (Last 24 hours) at 08/19/2021 1550 Last data filed at 08/19/2021 1529 Gross per 24 hour  Intake 150.38 ml  Output 150 ml  Net 0.38 ml   Filed Weights   08/11/21 2312 08/14/21 0400 08/15/21 0455  Weight: 54.5 kg 55.1 kg 57 kg    Examination:  General: Appearance:    Ill appearing female who appears uncomfortable     Lungs:     respirations unlabored,   Heart:    Tachycardic. regular  MS:   All extremities are intact.  Neurologic:   Less awake today and not speaking     Data Reviewed: I have personally reviewed following labs and imaging studies:  Recent Labs  Lab 08/15/21 0055 08/16/21 0249 08/17/21 0505 08/18/21 0059 08/19/21 1019  WBC 11.2* 10.3 13.4* 10.7* 20.2*  HGB 11.6* 12.2 12.4 13.3 14.6  HCT 35.3* 38.0 39.1 40.6 45.0  PLT 391 413* 435* 414* 469*  MCV 84.0 83.7 85.0 84.1 84.3  MCH 27.6 26.9 27.0 27.5 27.3  MCHC 32.9 32.1 31.7 32.8 32.4  RDW 16.3* 16.2* 16.3* 16.7* 17.7*  LYMPHSABS 0.6* 0.7 0.8 1.1  --   MONOABS 0.6 0.4 0.5 0.5  --   EOSABS 0.2 0.3 0.1 0.0  --   BASOSABS 0.0 0.0 0.0 0.1  --     Recent Labs  Lab 08/14/21 0138 08/14/21 0716 08/15/21 0055 08/15/21 0816 08/16/21 0249 08/16/21 0713 08/16/21 1015 08/17/21 0505 08/18/21 0059 08/19/21 0049 08/19/21 1019  NA 135  --  137  --  140  --   --  138 139  --  139  K 3.9  --  3.9  --  3.8  --   --  3.5 4.1  --  5.1  CL 107  --  109  --  112*  --   --  108 111  --  112*  CO2 18*  --  19*  --  19*  --   --  20* 18*  --  15*  GLUCOSE 114*  --  101*  --  109*  --   --  135* 133*  --  126*  BUN 32*  --  26*  --  20  --   --  23 31*  --  46*  CREATININE 0.91  --  0.77  --  0.69  --   --  0.85 1.07*  --  1.70*  CALCIUM 8.6*  --  8.2*  --  8.2*  --    --  8.2* 8.2*  --  8.1*  AST  --   --  24  --  26  --   --  31 36  --   --   ALT  --   --  11  --  11  --   --  12 10  --   --   ALKPHOS  --   --  41  --  41  --   --  38 39  --   --   BILITOT  --   --  0.6  --  0.5  --   --  0.4 0.3  --   --   ALBUMIN  --   --  2.0*  --  1.9*  --   --  1.8* 1.9*  --   --   MG  --   --  1.9  --  1.7  --   --  1.8 2.1  --   --   CRP 8.6*  --  12.0*  --  10.9*  --   --  9.8* 10.6*  --   --   DDIMER  --   --   --  3.22* 3.67*  --   --  4.10* 3.58* 3.33*  --   PROCALCITON  --    < > 0.21  --  0.24  --   --  0.55 1.14 1.80  --   LATICACIDVEN  --   --   --   --   --  1.6 2.0*  --   --   --   --   INR 1.8*  --   --   --   --   --   --   --   --   --   --   TSH  --   --   --   --   --   --   --   --  3.009  --   --    < > = values in this interval not displayed.         Radiology Studies: CT Angio Chest Pulmonary Embolism (PE) W or WO Contrast  Result Date: 08/17/2021 CLINICAL DATA:  PE suspected, high probability EXAM: CT ANGIOGRAPHY CHEST WITH CONTRAST TECHNIQUE: Multidetector CT imaging of the chest was performed using the standard protocol during bolus administration of intravenous contrast. Multiplanar CT image reconstructions and MIPs were obtained to evaluate the vascular anatomy. CONTRAST:  156mL OMNIPAQUE IOHEXOL 350 MG/ML SOLN COMPARISON:  CTA chest 05/24/2021, correlation is also made with CT chest without contrast 08/17/2021 FINDINGS: Cardiovascular: Satisfactory opacification of the pulmonary arteries to the segmental level. No evidence of pulmonary embolism. Normal heart size. No pericardial effusion. Aortic atherosclerosis. Mediastinum/Nodes: No enlarged mediastinal, hilar, or axillary lymph nodes. Thyroid gland, trachea, and esophagus demonstrate no significant findings. Lungs/Pleura: Trace left-greater-than-right pleural fluid, with minimal associated atelectasis. Unchanged 4 mm nodule in the right middle lobe (series 15, image 262), which has been  stable over multiple prior exams and may represent a subpleural lymph node. Upper Abdomen: No acute abnormality. High-density material within the stomach was present on the noncontrast exam and likely represents ingested material. Musculoskeletal: No acute osseous abnormality. Review of the MIP images confirms the above findings. IMPRESSION: 1. Negative for pulmonary embolism. 2. Redemonstrated trace left-greater-than-right pleural fluid with associated atelectasis. 3.  Aortic Atherosclerosis (ICD10-I70.0). Electronically Signed   By: Merilyn Baba M.D.   On: 08/17/2021 19:41   EEG adult  Result Date: 08/19/2021 Derek Jack, MD     08/19/2021  1:23 PM Routine EEG Report QUANETTA TRUSS is a 70 y.o. female with a history of shaking spells who is undergoing an EEG to evaluate for seizures. Report: This EEG was acquired with electrodes placed according to the International 10-20 electrode system (including Fp1, Fp2, F3, F4, C3, C4, P3, P4, O1, O2, T3, T4, T5, T6, A1, A2, Fz, Cz, Pz). The following electrodes were missing or displaced: none. The occipital dominant rhythm was 9 Hz. This activity is reactive to stimulation. Drowsiness was manifested by background fragmentation; deeper stages of sleep were not identified. There was no focal slowing. There were no interictal epileptiform discharges. There were no electrographic seizures identified. Photic stimulation and hyperventilation were not performed. Impression: This EEG was obtained while awake and drowsy and is normal. Low amplitude high volume shaking observed on video throughout the recording had no EEG correlate and is not favored to be epileptic.   Clinical Correlation: Normal EEGs, however, do not rule out epilepsy. Su Monks, MD [facility] [phone] If 7pm- 7am, please page neurology on call as listed in [place].   ECHOCARDIOGRAM COMPLETE  Result Date: 08/19/2021    ECHOCARDIOGRAM REPORT   Patient Name:   Michaela Rios [facility] Date  of Exam: 08/18/2021 Medical Rec #:  [id number]         Height:       66.0 in Accession #:    [id number]        Weight:  125.7 lb Date of Birth:  08-05-1951         BSA:          1.641 m Patient Age:    58 years          BP:           88/76 mmHg Patient Gender: F                 HR:           121 bpm. Exam Location:  Inpatient Procedure: 2D Echo, Color Doppler and Cardiac Doppler                               MODIFIED REPORT: This report was modified by Cherlynn Kaiser MD on 08/19/2021 due to revision.  Indications:     I50.30* Unspecified diastolic (congestive) heart failure  History:         Patient has prior history of Echocardiogram examinations, most                  recent 05/26/2020. Risk Factors:Hypertension.  Sonographer:     Tawnya Crook Referring Phys:  Arnell Asal Margaree Mackintosh [facility] Diagnosing Phys: Cherlynn Kaiser MD  Sonographer Comments: Technically difficult study due to poor echo windows. IMPRESSIONS  1. Left ventricular ejection fraction, by estimation, is 60 to 65%. The left ventricle has normal function. Left ventricular endocardial border not optimally defined to evaluate regional wall motion. There is mild left ventricular hypertrophy. Left ventricular diastolic parameters are indeterminate.  2. Right ventricular systolic function is mildly reduced. The right ventricular size is normal. There is normal pulmonary artery systolic pressure. The estimated right ventricular systolic pressure is 41.2 mmHg.  3. The mitral valve is grossly normal. Trivial mitral valve regurgitation. No evidence of mitral stenosis.  4. The aortic valve is grossly normal. There is mild thickening of the aortic valve. Aortic valve regurgitation is not visualized. No aortic stenosis is present.  5. The inferior vena cava is normal in size with greater than 50% respiratory variability, suggesting right atrial pressure of 3 mmHg. Conclusion(s)/Recommendation(s): Poor quality exam with limited diagnostic yield. Recommend repeat  limited echo when patient's heart rate has normalized. FINDINGS  Left Ventricle: Left ventricular ejection fraction, by estimation, is 60 to 65%. The left ventricle has normal function. Left ventricular endocardial border not optimally defined to evaluate regional wall motion. The left ventricular internal cavity size was normal in size. There is mild left ventricular hypertrophy. Left ventricular diastolic parameters are indeterminate. Right Ventricle: The right ventricular size is normal. No increase in right ventricular wall thickness. Right ventricular systolic function is mildly reduced. There is normal pulmonary artery systolic pressure. The tricuspid regurgitant velocity is 2.71 m/s, and with an assumed right atrial pressure of 3 mmHg, the estimated right ventricular systolic pressure is 87.8 mmHg. Left Atrium: Left atrial size was normal in size. Right Atrium: Right atrial size was not well visualized. Pericardium: There is no evidence of pericardial effusion. Mitral Valve: The mitral valve is grossly normal. Trivial mitral valve regurgitation. No evidence of mitral valve stenosis. Tricuspid Valve: The tricuspid valve is normal in structure. Tricuspid valve regurgitation is mild. Aortic Valve: The aortic valve is grossly normal. There is mild thickening of the aortic valve. Aortic valve regurgitation is not visualized. No aortic stenosis is present. Aortic valve mean gradient measures 3.0 mmHg. Aortic valve peak gradient measures  5.3 mmHg. Aortic valve area, by  VTI measures 4.63 cm. Pulmonic Valve: The pulmonic valve was not well visualized. Pulmonic valve regurgitation is trivial. Aorta: The ascending aorta was not well visualized and the aortic root is normal in size and structure. Venous: The inferior vena cava is normal in size with greater than 50% respiratory variability, suggesting right atrial pressure of 3 mmHg. IAS/Shunts: The interatrial septum was not well visualized.  LEFT VENTRICLE PLAX 2D  LVIDd:         2.10 cm  Diastology LVIDs:         1.50 cm  LV e' medial:  8.81 cm/s LV PW:         1.10 cm  LV e' lateral: 5.33 cm/s LV IVS:        1.10 cm LVOT diam:     2.40 cm LV SV:         59 LV SV Index:   36 LVOT Area:     4.52 cm  RIGHT VENTRICLE             IVC RV S prime:     11.70 cm/s  IVC diam: 1.60 cm TAPSE (M-mode): 1.7 cm LEFT ATRIUM             Index LA diam:        2.10 cm 1.28 cm/m LA Vol (A2C):   16.6 ml 10.11 ml/m LA Vol (A4C):   9.6 ml  5.86 ml/m LA Biplane Vol: 12.4 ml 7.55 ml/m  AORTIC VALVE                   PULMONIC VALVE AV Area (Vmax):    3.65 cm    PV Vmax:       0.68 m/s AV Area (Vmean):   2.93 cm    PV Peak grad:  1.8 mmHg AV Area (VTI):     4.63 cm AV Vmax:           115.00 cm/s AV Vmean:          79.800 cm/s AV VTI:            0.128 m AV Peak Grad:      5.3 mmHg AV Mean Grad:      3.0 mmHg LVOT Vmax:         92.70 cm/s LVOT Vmean:        51.700 cm/s LVOT VTI:          0.131 m LVOT/AV VTI ratio: 1.02  AORTA Ao Root diam: 3.10 cm TRICUSPID VALVE TR Peak grad:   29.4 mmHg TR Vmax:        271.00 cm/s  SHUNTS Systemic VTI:  0.13 m Systemic Diam: 2.40 cm Cherlynn Kaiser MD Electronically signed by Cherlynn Kaiser MD Signature Date/Time: 08/19/2021/12:00:36 AM    Final (Updated)      Scheduled Meds:  aspirin EC  81 mg Oral Daily   atorvastatin  40 mg Oral QHS   buPROPion  150 mg Oral Daily   clopidogrel  75 mg Oral Daily   docusate sodium  100 mg Oral BID   enoxaparin (LOVENOX) injection  30 mg Subcutaneous Q24H   metoprolol succinate  25 mg Oral Daily   polyethylene glycol  17 g Oral BID   sodium chloride flush  3 mL Intravenous Q12H   topiramate  50 mg Oral BID   cyanocobalamin  1,000 mcg Oral Daily   Continuous Infusions:  sodium chloride 100 mL/hr at 08/19/21 1516   cefTRIAXone (ROCEPHIN)  IV  1 g (08/19/21 1253)      LOS: 9 days    Time spent: 55 minutes  Signature  Geradine Girt DOon 08/19/2021 at 3:50 PM   -  To page go to [email or web address]

## 2021-08-19 NOTE — Progress Notes (Signed)
EEG done at bedside. Results pending.  

## 2021-08-19 NOTE — Progress Notes (Signed)
VAST consulted for difficult PIV placement. This RN attempted X2.without success in right forearm and left upper arm; both vein blew upon entrance of catheter into vein. Entire upper extremities assessed with US,no further vein options for peripheral access (none suitable for PICC either).   This RN discussed findings with primary RN and Dr. Eliseo Squires at bedside. Recommend CVC at this time if venous access.

## 2021-08-19 NOTE — Procedures (Signed)
Routine EEG Report  Michaela Rios is a 70 y.o. female with a history of shaking spells who is undergoing an EEG to evaluate for seizures.  Report: This EEG was acquired with electrodes placed according to the International 10-20 electrode system (including Fp1, Fp2, F3, F4, C3, C4, P3, P4, O1, O2, T3, T4, T5, T6, A1, A2, Fz, Cz, Pz). The following electrodes were missing or displaced: none.  The occipital dominant rhythm was 9 Hz. This activity is reactive to stimulation. Drowsiness was manifested by background fragmentation; deeper stages of sleep were not identified. There was no focal slowing. There were no interictal epileptiform discharges. There were no electrographic seizures identified. Photic stimulation and hyperventilation were not performed.  Impression: This EEG was obtained while awake and drowsy and is normal. Low amplitude high volume shaking observed on video throughout the recording had no EEG correlate and is not favored to be epileptic.    Clinical Correlation: Normal EEGs, however, do not rule out epilepsy.  Su Monks, MD [facility] [phone]  If 7pm- 7am, please page neurology on call as listed in [place].

## 2021-08-19 NOTE — Progress Notes (Signed)
SLP Cancellation Note  Patient Details Name: Michaela Rios MRN: [id number] DOB: [birth year]   Cancelled treatment:       Reason Eval/Treat Not Completed: Medical issues which prohibited therapy;Patient at procedure or test/unavailable (Pt receiving care from staff at time of approach. Pt's RN reported that pt's BP is low and that they are awaiting PCCM's assessment to determine whether she'll be transferred to ICU.)  Welda Azzarello I. [name], [name], [name] Office number [phone] Pager [place] 08/19/2021, 12:58 PM

## 2021-08-19 NOTE — Progress Notes (Signed)
Speech Language Pathology Treatment:    Patient Details Name: Michaela Rios MRN: [id number] DOB: January 18, 1951 Today's Date: 08/19/2021 Time: 3428-7681 SLP Time Calculation (min) (ACUTE ONLY): 27 min  Assessment / Plan / Recommendation Clinical Impression  Pt was seen for dysphagia treatment with her husband present. She was lethargic and required frequent stimulation to maintain adequate alertness. Verbal output was further reduced compared to when she was last seen by this SLP. Pt continues to demonstrate impaired attention and significant oral holding was noted during this session. Verbal and tactile cues were inconsistently effective in reducing length of oral holding, and suctioning was necessary to remove some boluses. No s/sx of aspiration were noted with solids or liquids. Pt's husband has reported that the pt has not been masticating dysphagia 2 solids and it was therefore agreed that her diet be modified further to dysphagia 1.  Considering her waning alertness, trials of more advanced solids were deferred. Short-term nonoral alimentation may be indicated if pt's limited intake and oral phase impairments continue. RD involvement will be beneficial. SLP will continue to follow pt.    HPI HPI: Michaela Rios is a 70 y.o. female with recent stroke discharged 06/04/2021. After rehabilitation her husband said that there was incremental improvement. Last Thursday (08/05/2021) during therapy she was able to walk as well as at home however, the ensuing days she progressively developed generalized weakness to the point that she could not even sit up straight. Difficulty swallowing also reported. Dx acute metabolic encephalopathy,  sepsis, unclear etiology.  MRI brain 9/4 showed new restricted diffusion consistent with new strokes in left BG. Neurology felt these are statistically more likely to be related to risk factors of hypertension, hyperlipidemia. During prior admission swallow function was  evaluated 6/30 with f/u on 7/08 revealing swallow WFL and tolerance of regular diet and thin liquids with no f/u needed for swallowing. EEG 9/22 normal. 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      SLP Plan  Continue with current plan of care      Recommendations for follow up therapy are one component of a multi-disciplinary discharge planning process, led by the attending physician.  Recommendations may be updated based on patient status, additional functional criteria and insurance authorization.    Recommendations  Diet recommendations: Dysphagia 1 (puree);Thin liquid Liquids provided via: Cup;Straw Medication Administration: Whole meds with puree Supervision: Staff to assist with self feeding Compensations: Minimize environmental distractions Postural Changes and/or Swallow Maneuvers: Seated upright 90 degrees                Oral Care Recommendations: Oral care BID Follow up Recommendations: 24 hour supervision/assistance;Skilled Nursing facility SLP Visit Diagnosis: Dysphagia, oral phase (R13.11) Plan: Continue with current plan of care       Acacia Latorre I. [name], [place], [place] Office number [phone] Pager [place]  08/19/2021, 5:40 PM

## 2021-08-19 NOTE — Progress Notes (Signed)
STROKE TEAM PROGRESS NOTE    INTERVAL HISTORY Her husband is at the bedside. Pt lying in bed, lethargic, still has hypotension and tachycardia. Per RN, pt has low urine output. Her Cre also elevated. EEG normal.    Vitals:   08/19/21 1600 08/19/21 1731 08/19/21 1935 08/19/21 1954  BP: 96/66 (!) 89/55 (!) 88/35   Pulse:      Temp:      Resp: (!) 29 (!) 29 (!) 29   Height:      Weight:      SpO2:   Comment: unable to obtain 99%  TempSrc:      BMI (Calculated):           IMAGING  CT HEAD WO CONTRAST (5MM)  Result Date: 08/15/2021 CLINICAL DATA:  Neuro deficit, stroke suspected right-sided weakness EXAM: CT HEAD WITHOUT CONTRAST TECHNIQUE: Contiguous axial images were obtained from the base of the skull through the vertex without intravenous contrast. COMPARISON:  08/14/2021 FINDINGS: Brain: No significant change from prior exam. Redemonstrated right basal ganglia and left thalamic infarcts. Periventricular white matter changes, likely the sequela of chronic small vessel ischemic disease. No acute cortical infarct, hemorrhage, mass, mass effect, or midline shift. No hydrocephalus. Vascular: No hyperdense vessel Skull: Normal. Negative for fracture or focal lesion. Sinuses/Orbits: No acute finding. Other: The mastoids are well aerated. IMPRESSION: No acute intracranial process. No significant change from the prior exam. Electronically Signed   By: Merilyn Baba M.D.   On: 08/15/2021 03:09   CT CHEST WO CONTRAST  Result Date: 08/17/2021 CLINICAL DATA:  Left lower lobe opacity on recent chest radiograph. History of strokes. Elevated white blood cell count. Pneumonia. EXAM: CT CHEST WITHOUT CONTRAST TECHNIQUE: Multidetector CT imaging of the chest was performed following the standard protocol without IV contrast. COMPARISON:  Plain film 08/15/2021 and CTA chest 05/24/2021 FINDINGS: Cardiovascular: Aortic atherosclerosis. Tortuous thoracic aorta. Normal heart size, without pericardial effusion.  Mediastinum/Nodes: No mediastinal or definite hilar adenopathy, given limitations of unenhanced CT. Surgical changes at the gastroesophageal junction. Lungs/Pleura: New trace pleural fluid since the prior CT. Patent airways. Motion degradation in the lung bases. Left lower lobe minimal volume loss and compressive dependent atelectasis. Subpleural right middle lobe 4 mm nodule on 98/6 is similar to on the 12/03/2004 exam, most likely a subpleural lymph node. Upper Abdomen: Normal imaged portions of the liver, spleen, stomach, pancreas, gallbladder. Musculoskeletal: No acute osseous abnormality. IMPRESSION: 1. Mildly motion degraded exam. 2. Trace left pleural fluid with adjacent volume loss and dependent atelectasis. No evidence of pneumonia. 3.  Aortic Atherosclerosis (ICD10-I70.0). Electronically Signed   By: Abigail Miyamoto M.D.   On: 08/17/2021 13:52   CT Angio Chest Pulmonary Embolism (PE) W or WO Contrast  Result Date: 08/17/2021 CLINICAL DATA:  PE suspected, high probability EXAM: CT ANGIOGRAPHY CHEST WITH CONTRAST TECHNIQUE: Multidetector CT imaging of the chest was performed using the standard protocol during bolus administration of intravenous contrast. Multiplanar CT image reconstructions and MIPs were obtained to evaluate the vascular anatomy. CONTRAST:  166m OMNIPAQUE IOHEXOL 350 MG/ML SOLN COMPARISON:  CTA chest 05/24/2021, correlation is also made with CT chest without contrast 08/17/2021 FINDINGS: Cardiovascular: Satisfactory opacification of the pulmonary arteries to the segmental level. No evidence of pulmonary embolism. Normal heart size. No pericardial effusion. Aortic atherosclerosis. Mediastinum/Nodes: No enlarged mediastinal, hilar, or axillary lymph nodes. Thyroid gland, trachea, and esophagus demonstrate no significant findings. Lungs/Pleura: Trace left-greater-than-right pleural fluid, with minimal associated atelectasis. Unchanged 4 mm nodule in the right middle  lobe (series 15, image  262), which has been stable over multiple prior exams and may represent a subpleural lymph node. Upper Abdomen: No acute abnormality. High-density material within the stomach was present on the noncontrast exam and likely represents ingested material. Musculoskeletal: No acute osseous abnormality. Review of the MIP images confirms the above findings. IMPRESSION: 1. Negative for pulmonary embolism. 2. Redemonstrated trace left-greater-than-right pleural fluid with associated atelectasis. 3.  Aortic Atherosclerosis (ICD10-I70.0). Electronically Signed   By: Merilyn Baba M.D.   On: 08/17/2021 19:41   MR Brain W Wo Contrast  Result Date: 08/02/2021 CLINICAL DATA:  Stroke, follow-up.  Previous abnormal MRI. EXAM: MRI HEAD WITHOUT AND WITH CONTRAST TECHNIQUE: Multiplanar, multiecho pulse sequences of the brain and surrounding structures were obtained without and with intravenous contrast. CONTRAST:  69m MULTIHANCE GADOBENATE DIMEGLUMINE 529 MG/ML IV SOLN COMPARISON:  MR head without and with contrast 06/02/2021 05/26/21 FINDINGS: Brain: Continued expected evolution of deep gray matter infarcts noted. No residual enhancement is present. Punctate areas of diffusion signal surround the infarct involving the right internal capsule and caudate. Additional punctate areas of diffusion hyperintensity noted within the left caudate head and posterior left putamen. Areas of confluent T2 signal change are noted in the basal ganglia bilaterally and left greater than right thalami without significant interval change. No acute hemorrhage or mass lesion is present. The ventricles are of normal size. No significant extraaxial fluid collection is present. Postcontrast images are otherwise within normal limits. Vascular: Flow is present in the major intracranial arteries. Skull and upper cervical spine: Mild degenerative changes are noted at C2-3. The craniocervical junction is normal. Midline structures are within normal limits. Marrow  signal is unremarkable. Sinuses/Orbits: The paranasal sinuses and mastoid air cells are clear. The globes and orbits are within normal limits. IMPRESSION: 1. Continued expected evolution of deep gray matter infarcts bilaterally. 2. Additional punctate areas of diffusion hyperintensity are present within the left caudate head and posterior left putamen. These likely represent areas of acute/subacute ischemia. 3. Stable diffuse T2 signal change in the basal ganglia bilaterally and left greater than right thalami. This likely reflects the sequela of chronic microvascular ischemia. Electronically Signed   By: CSan MorelleM.D.   On: 08/02/2021 08:29   CT ABDOMEN PELVIS W CONTRAST  Result Date: 08/15/2021 CLINICAL DATA:  Fever, abdominal pain EXAM: CT ABDOMEN AND PELVIS WITH CONTRAST TECHNIQUE: Multidetector CT imaging of the abdomen and pelvis was performed using the standard protocol following bolus administration of intravenous contrast. CONTRAST:  1064mOMNIPAQUE IOHEXOL 300 MG/ML  SOLN COMPARISON:  06/24/2021 FINDINGS: Lower chest: No acute abnormality. Unchanged elevation of the left hemidiaphragm. Hepatobiliary: No solid liver abnormality is seen. No gallstones, gallbladder wall thickening, or biliary dilatation. Pancreas: Unremarkable. No pancreatic ductal dilatation or surrounding inflammatory changes. Spleen: Normal in size without significant abnormality. Adrenals/Urinary Tract: Adrenal glands are unremarkable. Kidneys are normal, without renal calculi, solid lesion, or hydronephrosis. Heterogeneous material within the dependent left urinary bladder, dependently hyperdense (series 3, image 73). Stomach/Bowel: Stomach is within normal limits. Appendix appears normal. No evidence of bowel wall thickening, distention, or inflammatory changes. Large burden of stool throughout the distal colon. Vascular/Lymphatic: Aortic atherosclerosis. No enlarged abdominal or pelvic lymph nodes. Reproductive: Status  post hysterectomy. Other: No abdominal wall hernia. Anasarca. No abdominopelvic ascites. Musculoskeletal: No acute or significant osseous findings. IMPRESSION: 1. Heterogeneous material within the dependent left urinary bladder, dependently hyperdense. This may reflect some combination of excreted contrast with mixing artifact, clot, or small bladder  stones/calcific debris. Endoluminal bladder mass is difficult to strictly exclude. Recommend delayed phase CT urogram to further evaluate. 2. Large burden of stool throughout the distal colon. 3. Anasarca. Aortic Atherosclerosis (ICD10-I70.0). Electronically Signed   By: Eddie Candle M.D.   On: 08/15/2021 12:50   DG Chest Port 1 View  Result Date: 08/15/2021 CLINICAL DATA:  Retrocardiac density. EXAM: PORTABLE CHEST 1 VIEW COMPARISON:  08/14/2021 FINDINGS: Right lung clear. Persistent retrocardiac streaky opacity, likely atelectasis or scar. The cardiopericardial silhouette is within normal limits for size. The visualized bony structures of the thorax show no acute abnormality. IMPRESSION: Persistent retrocardiac streaky opacity, likely atelectasis or scar. This appears to be new since CT scan of 05/25/2021. Follow-up chest x-ray could be used to ensure resolution. Electronically Signed   By: Misty Stanley M.D.   On: 08/15/2021 08:07   DG Chest Port 1 View  Result Date: 08/14/2021 CLINICAL DATA:  Shortness of breath EXAM: PORTABLE CHEST 1 VIEW COMPARISON:  08/26/2021 FINDINGS: Hazy and streaky retrocardiac opacity. Aortic tortuosity. Normal heart size. Implantable loop recorder. The lungs are clear of edema. No pleural fluid or pneumothorax. IMPRESSION: Retrocardiac opacity with shape favoring atelectasis. Electronically Signed   By: Jorje Guild M.D.   On: 08/14/2021 09:14   DG Chest Port 1 View  Result Date: 08/24/2021 CLINICAL DATA:  Concern for sepsis EXAM: PORTABLE CHEST 1 VIEW COMPARISON:  Chest x-ray dated July 18th 2022 FINDINGS: Cardiac and  mediastinal contours are unchanged when accounting for differences in lung volumes. Left basilar atelectasis. Lungs otherwise clear. No pleural effusion or pneumothorax. Loop recorder device noted. IMPRESSION: No active disease. Electronically Signed   By: Yetta Glassman M.D.   On: 08/09/2021 11:06   EEG adult  Result Date: 08/19/2021 Derek Jack, MD     08/19/2021  1:23 PM Routine EEG Report KHALEAH DUER is a 70 y.o. female with a history of shaking spells who is undergoing an EEG to evaluate for seizures. Report: This EEG was acquired with electrodes placed according to the International 10-20 electrode system (including Fp1, Fp2, F3, F4, C3, C4, P3, P4, O1, O2, T3, T4, T5, T6, A1, A2, Fz, Cz, Pz). The following electrodes were missing or displaced: none. The occipital dominant rhythm was 9 Hz. This activity is reactive to stimulation. Drowsiness was manifested by background fragmentation; deeper stages of sleep were not identified. There was no focal slowing. There were no interictal epileptiform discharges. There were no electrographic seizures identified. Photic stimulation and hyperventilation were not performed. Impression: This EEG was obtained while awake and drowsy and is normal. Low amplitude high volume shaking observed on video throughout the recording had no EEG correlate and is not favored to be epileptic.   Clinical Correlation: Normal EEGs, however, do not rule out epilepsy. Su Monks, MD [facility] [phone] If 7pm- 7am, please page neurology on call as listed in [place].   VAS [place] TRANSCRANIAL DOPPLER W BUBBLES  Result Date: 08/16/2021  Transcranial Doppler with Bubble Patient Name:  ELFIDA SHIMADA [facility]  Date of Exam:   08/13/2021 Medical Rec #: [id number]          Accession #:    [id number] Date of Birth: Apr 04, 1951          Patient Gender: F Patient Age:   70 years Exam Location:  [facility] Procedure:      VAS [place] TRANSCRANIAL DOPPLER W BUBBLES Referring  Phys: Lelan Pons FRANCOIS --------------------------------------------------------------------------------  Indications: Stroke. Comparison Study: No prior studies. Performing  Technologist: Oliver Hum RVT  Examination Guidelines: A complete evaluation includes B-mode imaging, spectral Doppler, color Doppler, and power Doppler as needed of all accessible portions of each vessel. Bilateral testing is considered an integral part of a complete examination. Limited examinations for reoccurring indications may be performed as noted.  Summary:  A vascular evaluation was performed. The right middle cerebral artery was studied. An IV was inserted into the patient's right Cephalic vein.. Verbal informed consent was obtained.  A near curtain effect was noted with and without valsalva. Moderate to large, Spencer grade 5 PFO. Positive TCD Bubble study indicative of a medium to large size right to left shunt *See table(s) above for TCD measurements and observations.  Diagnosing physician: Antony Contras MD Electronically signed by Antony Contras MD on 08/16/2021 at 9:16:53 AM.    Final    ECHOCARDIOGRAM COMPLETE  Result Date: 08/19/2021    ECHOCARDIOGRAM REPORT   Patient Name:   VENISHA BOEHNING [facility] Date of Exam: 08/18/2021 Medical Rec #:  [id number]         Height:       66.0 in Accession #:    [id number]        Weight:       125.7 lb Date of Birth:  [birth year]         BSA:          1.641 m Patient Age:    15 years          BP:           88/76 mmHg Patient Gender: F                 HR:           121 bpm. Exam Location:  Inpatient Procedure: 2D Echo, Color Doppler and Cardiac Doppler                               MODIFIED REPORT: This report was modified by Cherlynn Kaiser MD on 08/19/2021 due to revision.  Indications:     I50.30* Unspecified diastolic (congestive) heart failure  History:         Patient has prior history of Echocardiogram examinations, most                  recent 05/26/2020. Risk Factors:Hypertension.  Sonographer:      Tawnya Crook Referring Phys:  Arnell Asal Margaree Mackintosh [facility] Diagnosing Phys: Cherlynn Kaiser MD  Sonographer Comments: Technically difficult study due to poor echo windows. IMPRESSIONS  1. Left ventricular ejection fraction, by estimation, is 60 to 65%. The left ventricle has normal function. Left ventricular endocardial border not optimally defined to evaluate regional wall motion. There is mild left ventricular hypertrophy. Left ventricular diastolic parameters are indeterminate.  2. Right ventricular systolic function is mildly reduced. The right ventricular size is normal. There is normal pulmonary artery systolic pressure. The estimated right ventricular systolic pressure is 67.1 mmHg.  3. The mitral valve is grossly normal. Trivial mitral valve regurgitation. No evidence of mitral stenosis.  4. The aortic valve is grossly normal. There is mild thickening of the aortic valve. Aortic valve regurgitation is not visualized. No aortic stenosis is present.  5. The inferior vena cava is normal in size with greater than 50% respiratory variability, suggesting right atrial pressure of 3 mmHg. Conclusion(s)/Recommendation(s): Poor quality exam with limited diagnostic yield. Recommend repeat limited echo when patient's heart rate has normalized. FINDINGS  Left Ventricle: Left ventricular ejection fraction, by estimation, is 60 to 65%. The left ventricle has normal function. Left ventricular endocardial border not optimally defined to evaluate regional wall motion. The left ventricular internal cavity size was normal in size. There is mild left ventricular hypertrophy. Left ventricular diastolic parameters are indeterminate. Right Ventricle: The right ventricular size is normal. No increase in right ventricular wall thickness. Right ventricular systolic function is mildly reduced. There is normal pulmonary artery systolic pressure. The tricuspid regurgitant velocity is 2.71 m/s, and with an assumed right atrial pressure of 3  mmHg, the estimated right ventricular systolic pressure is 41.3 mmHg. Left Atrium: Left atrial size was normal in size. Right Atrium: Right atrial size was not well visualized. Pericardium: There is no evidence of pericardial effusion. Mitral Valve: The mitral valve is grossly normal. Trivial mitral valve regurgitation. No evidence of mitral valve stenosis. Tricuspid Valve: The tricuspid valve is normal in structure. Tricuspid valve regurgitation is mild. Aortic Valve: The aortic valve is grossly normal. There is mild thickening of the aortic valve. Aortic valve regurgitation is not visualized. No aortic stenosis is present. Aortic valve mean gradient measures 3.0 mmHg. Aortic valve peak gradient measures  5.3 mmHg. Aortic valve area, by VTI measures 4.63 cm. Pulmonic Valve: The pulmonic valve was not well visualized. Pulmonic valve regurgitation is trivial. Aorta: The ascending aorta was not well visualized and the aortic root is normal in size and structure. Venous: The inferior vena cava is normal in size with greater than 50% respiratory variability, suggesting right atrial pressure of 3 mmHg. IAS/Shunts: The interatrial septum was not well visualized.  LEFT VENTRICLE PLAX 2D LVIDd:         2.10 cm  Diastology LVIDs:         1.50 cm  LV e' medial:  8.81 cm/s LV PW:         1.10 cm  LV e' lateral: 5.33 cm/s LV IVS:        1.10 cm LVOT diam:     2.40 cm LV SV:         59 LV SV Index:   36 LVOT Area:     4.52 cm  RIGHT VENTRICLE             IVC RV S prime:     11.70 cm/s  IVC diam: 1.60 cm TAPSE (M-mode): 1.7 cm LEFT ATRIUM             Index LA diam:        2.10 cm 1.28 cm/m LA Vol (A2C):   16.6 ml 10.11 ml/m LA Vol (A4C):   9.6 ml  5.86 ml/m LA Biplane Vol: 12.4 ml 7.55 ml/m  AORTIC VALVE                   PULMONIC VALVE AV Area (Vmax):    3.65 cm    PV Vmax:       0.68 m/s AV Area (Vmean):   2.93 cm    PV Peak grad:  1.8 mmHg AV Area (VTI):     4.63 cm AV Vmax:           115.00 cm/s AV Vmean:           79.800 cm/s AV VTI:            0.128 m AV Peak Grad:      5.3 mmHg AV Mean Grad:      3.0 mmHg LVOT Vmax:  92.70 cm/s LVOT Vmean:        51.700 cm/s LVOT VTI:          0.131 m LVOT/AV VTI ratio: 1.02  AORTA Ao Root diam: 3.10 cm TRICUSPID VALVE TR Peak grad:   29.4 mmHg TR Vmax:        271.00 cm/s  SHUNTS Systemic VTI:  0.13 m Systemic Diam: 2.40 cm Cherlynn Kaiser MD Electronically signed by Cherlynn Kaiser MD Signature Date/Time: 08/19/2021/12:00:36 AM    Final (Updated)    CUP PACEART REMOTE DEVICE CHECK  Result Date: 07/22/2021 ILR summary report received. Battery status OK. Normal device function. No new symptom, tachy, brady, or pause episodes. No new AF episodes. Monthly summary reports and ROV/PRN Kathy Breach, RN, CCDS, [employer]  VAS [place] LOWER EXTREMITY VENOUS (DVT)  Result Date: 08/13/2021  Lower Venous DVT Study Patient Name:  TIAMARIE FURNARI [facility]  Date of Exam:   08/13/2021 Medical Rec #: [id number]          Accession #:    [id number] Date of Birth: 1951/08/15          Patient Gender: F Patient Age:   87 years Exam Location:  [facility] Procedure:      VAS [place] LOWER EXTREMITY VENOUS (DVT) Referring Phys: Lelan Pons FRANCOIS --------------------------------------------------------------------------------  Indications: Stroke. Other Indications: PFO. Limitations: Poor ultrasound/tissue interface and patient positioning, patient immobility. Comparison Study: No prior studies. Performing Technologist: Oliver Hum RVT  Examination Guidelines: A complete evaluation includes B-mode imaging, spectral Doppler, color Doppler, and power Doppler as needed of all accessible portions of each vessel. Bilateral testing is considered an integral part of a complete examination. Limited examinations for reoccurring indications may be performed as noted. The reflux portion of the exam is performed with the patient in reverse Trendelenburg.   +---------+---------------+---------+-----------+----------+--------------+ RIGHT    CompressibilityPhasicitySpontaneityPropertiesThrombus Aging +---------+---------------+---------+-----------+----------+--------------+ CFV      Full           Yes      Yes                                 +---------+---------------+---------+-----------+----------+--------------+ SFJ      Full                                                        +---------+---------------+---------+-----------+----------+--------------+ FV Prox  Full                                                        +---------+---------------+---------+-----------+----------+--------------+ FV Mid   Full                                                        +---------+---------------+---------+-----------+----------+--------------+ FV DistalFull                                                        +---------+---------------+---------+-----------+----------+--------------+  PFV      Full                                                        +---------+---------------+---------+-----------+----------+--------------+ POP      Full           Yes      Yes                                 +---------+---------------+---------+-----------+----------+--------------+ PTV      Full                                                        +---------+---------------+---------+-----------+----------+--------------+ PERO     Full                                                        +---------+---------------+---------+-----------+----------+--------------+   +---------+---------------+---------+-----------+----------+--------------+ LEFT     CompressibilityPhasicitySpontaneityPropertiesThrombus Aging +---------+---------------+---------+-----------+----------+--------------+ CFV      Full           Yes      Yes                                  +---------+---------------+---------+-----------+----------+--------------+ SFJ      Full                                                        +---------+---------------+---------+-----------+----------+--------------+ FV Prox  Full                                                        +---------+---------------+---------+-----------+----------+--------------+ FV Mid   Full                                                        +---------+---------------+---------+-----------+----------+--------------+ FV Distal               Yes      Yes                                 +---------+---------------+---------+-----------+----------+--------------+ PFV      Full                                                        +---------+---------------+---------+-----------+----------+--------------+  POP      Full           Yes      Yes                                 +---------+---------------+---------+-----------+----------+--------------+ PTV      Full                                                        +---------+---------------+---------+-----------+----------+--------------+ PERO     Full                                                        +---------+---------------+---------+-----------+----------+--------------+     Summary: RIGHT: - There is no evidence of deep vein thrombosis in the lower extremity. However, portions of this examination were limited- see technologist comments above.  - No cystic structure found in the popliteal fossa.  LEFT: - There is no evidence of deep vein thrombosis in the lower extremity. However, portions of this examination were limited- see technologist comments above.  - No cystic structure found in the popliteal fossa.  *See table(s) above for measurements and observations. Electronically signed by Orlie Pollen on 08/13/2021 at 7:07:26 PM.    Final    VAS [place] UPPER EXTREMITY VENOUS DUPLEX  Result Date: 08/16/2021 UPPER  VENOUS STUDY  Patient Name:  TALIN ROZEBOOM [facility]  Date of Exam:   08/15/2021 Medical Rec #: [id number]          Accession #:    [id number] Date of Birth: 11/30/1950          Patient Gender: F Patient Age:   74 years Exam Location:  [facility] Procedure:      VAS [place] UPPER EXTREMITY VENOUS DUPLEX Referring Phys: Deno Etienne [facility] --------------------------------------------------------------------------------  Indications: Fever of unknown origin Limitations: Inability to keep arm extended. Comparison Study: No prior study Performing Technologist: Sharion Dove RVS  Examination Guidelines: A complete evaluation includes B-mode imaging, spectral Doppler, color Doppler, and power Doppler as needed of all accessible portions of each vessel. Bilateral testing is considered an integral part of a complete examination. Limited examinations for reoccurring indications may be performed as noted.  Right Findings: +----------+------------+---------+-----------+----------+---------------+ RIGHT     CompressiblePhasicitySpontaneousProperties    Summary     +----------+------------+---------+-----------+----------+---------------+ IJV           Full       Yes       Yes                              +----------+------------+---------+-----------+----------+---------------+ Subclavian               Yes       Yes                              +----------+------------+---------+-----------+----------+---------------+ Axillary      Full       Yes       Yes                              +----------+------------+---------+-----------+----------+---------------+  Brachial      Full       Yes       Yes                              +----------+------------+---------+-----------+----------+---------------+ Radial                                              patent by color +----------+------------+---------+-----------+----------+---------------+ Ulnar                                                Not visualized  +----------+------------+---------+-----------+----------+---------------+ Cephalic      Full                                                  +----------+------------+---------+-----------+----------+---------------+ Basilic                                             Not visualized  +----------+------------+---------+-----------+----------+---------------+  Left Findings: +----------+------------+---------+-----------+----------+-----------------+ LEFT      CompressiblePhasicitySpontaneousProperties     Summary      +----------+------------+---------+-----------+----------+-----------------+ IJV           Full       Yes       Yes                                +----------+------------+---------+-----------+----------+-----------------+ Subclavian               Yes       Yes                                +----------+------------+---------+-----------+----------+-----------------+ Axillary                 Yes       Yes                                +----------+------------+---------+-----------+----------+-----------------+ Brachial      Full       Yes       Yes                                +----------+------------+---------+-----------+----------+-----------------+ Radial                                               patent by color  +----------+------------+---------+-----------+----------+-----------------+ Ulnar  patent by color  +----------+------------+---------+-----------+----------+-----------------+ Cephalic      None                                       Chronic      +----------+------------+---------+-----------+----------+-----------------+ Basilic       None                                  Age Indeterminate +----------+------------+---------+-----------+----------+-----------------+  Summary:  Right: No evidence of deep vein thrombosis in the upper extremity.  No evidence of superficial vein thrombosis in the upper extremity.  Left: No evidence of deep vein thrombosis in the upper extremity. Findings consistent with age indeterminate superficial vein thrombosis involving the left basilic vein. 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  *See table(s) above for measurements and observations.  Diagnosing physician: Servando Snare MD Electronically signed by Servando Snare MD on 08/16/2021 at 4:45:02 PM.    Final    CT HEAD WO CONTRAST (CHARM STUDY)  Result Date: 08/14/2021 CLINICAL DATA:  Mild neurologic decline EXAM: CT HEAD WITHOUT CONTRAST TECHNIQUE: Contiguous axial images were obtained from the base of the skull through the vertex without intravenous contrast. COMPARISON:  CTA head 05/26/2021, MRI brain 08/01/2021. 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 Periventricular white matter changes, likely the sequela of chronic small vessel ischemic disease. No acute infarct, hemorrhage, mass, mass effect, or midline shift. No definite hydrocephalus. Vascular: No hyperdense vessel or unexpected calcification. Skull: Normal. Negative for fracture or focal lesion. Sinuses/Orbits: No acute finding. Other: The mastoids are well aerated. IMPRESSION: No acute intracranial process. Electronically Signed   By: Merilyn Baba M.D.   On: 08/14/2021 01:04       Pertinent labs:  05/27/21 CSF WBC 2 CSF RBC 26 CSF Protein 82 CSF glucose 52 CSF VDRL -nonreactive CSF cytology - No malignant cells CSF oligoclonal bands - negative CSF mayo paraneoplstic panel - +GAD65 antibody  7/2-06/01/21 ANA positive DSDNA - negative RF - negative Hepatitis antibody panel - negative RPR - non-reactive SSA,SSB -negative ANCA - negative C3,C4 - normal ACE - 50 (normal) EBV Ab - positive IgG but not IgM, consistent with previous infection CRP 6.8 ESR 37 TSH normal B12 254 B1 normal HIV  negative     PHYSICAL EXAM  Temp:  [97.6 F (36.4 C)-100.8 F (38.2 C)] 99 F (37.2 C) (09/22 2045) Pulse Rate:  [133-137] 136 (09/22 1345) Resp:  [17-36] 33 (09/22 2045) BP: (50-121)/(21-94) 103/57 (09/22 2045) SpO2:  [85 %-100 %] 94 % (09/22 2045)  General - Well nourished, well developed, lethargic  Ophthalmologic - fundi not visualized due to noncooperation.  Cardiovascular - Regular rhythm and tachycardia.  Neuro - awake, alert, eyes open, however, very lethargic, flat affect.  Psychomotor slowing, able to answer questions with low voice, hypophonia and slow responding, orientated to people place age but did not answer question for time.  Answer question with single words, paucity of speech, however follows most simple commands.  Able to name and repeat but mild to moderate dysarthria. No gaze palsy, tracking bilaterally, visual field full, PERRL. No facial droop. Tongue midline. Bilateral UEs 3/5, no drift. Bilaterally LEs proximal 0/5, distal toe DF/PF 2/5. Sensation symmetrical bilaterally subjectively, b/l FTN intact slow but grossly intact although incomplete, gait not tested.  ASSESSMENT/PLAN Ms. KANISHA DUBA is a 70 y.o. female with history of recent bihemispheric strokes, chronic migraine, hypertension, presenting with  generalized weakness and spastic left sided hemiparesis, fever and lethargy.    Metabolic encephalopathy  FUO Sepsis Intermittent fever Hypotension with tachycardia AKI Cre 0.85-1.07-1.70 Blood culture x2 negative ANA positive x3, ENA SM  positive, RF, CCPA, ANCA, dsDNA negative CRP 8.6->12->10.9->9.8->10.6 ESR 34, procalcitonin negative TSH and free T4 normal EEG normal ID on board Stroke this admission is too small to account for encephalopathy. Sepsis/FUO management per primary team   Stroke:  left caudate and putamen punctate infarcts likely small vessel disease CT 08/14/21 and 08/15/21 no acute abnormalities MRI  08/02/2021 punctate  areas of diffusion hyperintensity are present within the left caudate head and posterior left putamen. These likely represent areas of acute/subacute ischemia. Continued expected evolution of deep gray matter infarcts bilaterally. TCD bubble study 9/16 confirmed A near curtain effect was noted with and without valsalva. Moderate to large, Spencer grade 5 PFO.  LE and UE venous Doppler 9/16 and 9/18 negative for DVT EF 60 to 65% Loop recorder interrogation no A. fib D-dimer 3.22->3.67->4.10-> 3.58-> 3.33 Hypercoagulable work-up pending DVT prophylaxis - Lovenox On aspirin 81 and Plavix 75 DAPT for 3 weeks and then aspirin alone. PT/OT recommend SNF Deposition pending  History of stroke 04/2021 admitted for left-sided weakness and fever, CT showed right BG/CR infarct.  MRI 6/29 confirmed right BG/CR infarct. CTA head and neck 6/29 no significant finding. 2D Echo with EF 60-65%.  7/5 Cerebral angiography was unremarkable. MRI 7/6 Numerous small acute bilateral cerebral and cerebellar infarcts compatible with emboli, could be embolic or related to angiogram procedure. TEE 7/7 EF 60-65%, Agitated saline contrast bubble study was positive for a large PFO.  No vegetation of thrombus seen. 7/12 Loop recorder placed. LDL 45, HgbA1c 5.9.  Discharged to CIR with DAPT for 3 weeks and then ASA alone.   Large PFO TEE 7/7 Agitated saline contrast bubble study was positive for a large PFO.  TCD bubble study 9/16 confirmed A near curtain effect was noted with and without valsalva. Moderate to large, Spencer grade 5 PFO.  Per Dr. Leonie Man "ROPE score is 2 points indicating 0% chance that stroke is due to PFO." Follow up with Dr. Leonie Man at [facility]   Left > Right brain abnormal signal on MRI - possible chronic microvascular ischemia. MRI brain showed Hyperintensity and mild enlargement of the left head of caudate. Hyperintensity also in the left thalamus and a small amount of hyperintensity in the right medial  thalamus. MRI with contrast Mild, largely vascular/perivascular appearing enhancement in the areas of signal abnormality in the deep gray nuclei as well as a small amount of leptomeningeal enhancement over the right and possibly left parietal convexities. No solid enhancing mass Asymptomatic for focal deficit at this time EEG cortical dysfunction arising from left temporal region, nonspecific etiology, no seizure EEG 9/22 normal CSF WBC 2, RBC 22, protein 82 and glucose 52.  Cryptococcal antigen, VDRL, oligoclonal bands, HSV PCR, cytology all negative, paraneoplastic panel + GAD65, but pt has no evidence of "stiff man syndrome", not sure about the significance ESR 37 and CRP 6.8 Autoimmune work up positive for ANA, but negative for ANCA, SSA/B, RF, dsDNA, C3/C4, ACE Repeat work up 07/2021 - ANA positive x3, ENA SM  positive, RF, CCPA, ANCA, dsDNA negative, CRP 8.6->12->10.9->9.8->10.6, ESR 34, procalcitonin negative Cerebral angiogram 7/5 - no vasculitis or vascular abnormality MRI 7/6 Decreased T2 signal  abnormality and enhancement in the left greater than right deep gray nuclei and deep white matter tracts MRI 9/5 Stable diffuse T2 signal change in the basal ganglia bilaterally and left greater than right thalami. This likely reflects the sequela of chronic microvascular ischemia.  hypotension Hx of hypertension Stable on the low end Long-term BP goal normotensive   Lipid management Home meds:  none LDL 45, goal < 70 No statin as LDL well below goal   Other Stroke Risk Factors Advanced Age >/= 84  Migaine headache, on topamax, and takes imitrex prn   Other Active Problems Tachycardia AKI   Hospital day # 10  Neurology will sign off. Please call with questions. Pt will follow up with stroke clinic Dr. Leonie Man on 09/30/2021 at [facility] Thanks for the consult.  Patient has developed AKI, hypotension, tachycardia, continues to have intermittent fever, and I discussed with Dr. Eliseo Squires. I spent   35 minutes in total face-to-face time with the patient, more than 50% of which was spent in counseling and coordination of care, reviewing test results, images and medication, and discussing the diagnosis, treatment plan and potential prognosis. This patient's care requiresreview of multiple databases, neurological assessment, discussion with family, other specialists and medical decision making of high complexity. I had long discussion with husband at bedside, updated pt current condition, treatment plan and potential prognosis, and answered all the questions.  He expressed understanding and appreciation.   Rosalin Hawking, MD PhD Stroke Neurology 08/19/2021 9:18 PM

## 2021-08-19 NOTE — Progress Notes (Addendum)
HOSPITAL MEDICINE OVERNIGHT EVENT NOTE    Notified by both bedside nurse and rapid response the patient is once again becoming hypotensive with systolic blood pressures in the 40J and diastolic blood pressures in the 30s.  Patient continues to be tachycardic which is not a significant change but in addition to this is beginning to exhibit fevers again, exhibiting two fevers in the past 12 hours.  Chart reviewed, patient has recently been restarted on intravenous ceftriaxone with a urinalysis performed earlier today  suggestive of a recurrent urinary tract infection.  Rapid response is already administered a 250 cc bolus without improvement in blood pressures.  Nursing additionally reports that patient's urine output has been extremely poor throughout the day.  Patient is notably somnolent.  Performing additional 500 cc normal saline bolus.  Obtain 2 sets of blood cultures, lactic acid and chest x-ray.  We will attempt to be more aggressive in our volume resuscitation efforts to achieve maps of above 60-65.  If patient continues to exhibit fevers throughout the evening or continues to be hypotensive will consider broadening antibiotic coverage and even potentially transferring patient to the intensive care unit if hypotension does not resolve.  Michaela Emerald  MD [facility]   ADDENDUM 9/22 11pm  Follow-up discussion with nursing reveals that blood pressures have dramatically improved to 138/94 status post a total of 750 cc in normal saline boluses.  This suggest that patient's hypotension is certainly fluid responsive.  Repeat blood cultures have been obtained this evening.  Chest x-ray obtained and is unremarkable.  Lactic acid has been obtained and is found to be quite elevated at 4.9, a dramatic rise from 2.0 on 9/19.  Lactic acidosis is consistent with downtrending bicarbonate levels seen on daily chemistries the past few days. It is  additionally likely the cause of the patient's  ongoing tachypnea as the patient is likely exhibiting a compensatory respiratory alkalosis in the setting of progressive metabolic acidosis.  Will give an additional small bolus of normal saline, increase rate of maintenance fluids and obtain repeat lactic acid levels in the morning.  Continuing intravenous ceftriaxone but have low threshold for broadening antibiotics if patient continues to spike fevers or hypotension recurs.  Continue close conical monitoring.  Sherryll Burger Ticia Virgo

## 2021-08-19 NOTE — Progress Notes (Signed)
HOSPITAL MEDICINE OVERNIGHT EVENT NOTE    Notified by nursing that patient has exhibited extremely low urine output throughout the evening and has essentially refused most oral intake over the past 24 hours.  Furthermore, the patient has exhibited intermittent low blood pressuress with systolic blood pressures intermittently in the 80s.  Chart reviewed, patient recently managed for a suspected infectious process with recurrent fevers however there have been no fevers in the past 24 hours.  Patient is a received a 5-day course of antibiotics during this hospitalization.  Patient also has been noted to have what seems to be chronic tachycardia that was even present prior to hospitalization and observed on interrogation of patient's loop recorder.  Considering poor oral intake and urine output, will administer 500 cc lactated ringer bolus and reassess blood pressures.  Nursing to encourage oral intake.  Day team to reassess volume status in the morning.  Vernelle Emerald  MD [facility]

## 2021-08-20 ENCOUNTER — Inpatient Hospital Stay (HOSPITAL_COMMUNITY): Payer: Medicare HMO

## 2021-08-20 ENCOUNTER — Ambulatory Visit: Payer: Medicare HMO | Admitting: Physical Therapy

## 2021-08-20 ENCOUNTER — Ambulatory Visit: Payer: Medicare HMO | Admitting: Occupational Therapy

## 2021-08-20 DIAGNOSIS — N179 Acute kidney failure, unspecified: Secondary | ICD-10-CM

## 2021-08-20 DIAGNOSIS — A419 Sepsis, unspecified organism: Secondary | ICD-10-CM | POA: Diagnosis not present

## 2021-08-20 DIAGNOSIS — E872 Acidosis: Secondary | ICD-10-CM | POA: Diagnosis not present

## 2021-08-20 DIAGNOSIS — I63313 Cerebral infarction due to thrombosis of bilateral middle cerebral arteries: Secondary | ICD-10-CM | POA: Diagnosis not present

## 2021-08-20 LAB — POCT I-STAT 7, (LYTES, BLD GAS, ICA,H+H)
Acid-base deficit: 20 mmol/L — ABNORMAL HIGH (ref 0.0–2.0)
Acid-base deficit: 7 mmol/L — ABNORMAL HIGH (ref 0.0–2.0)
Bicarbonate: 6.6 mmol/L — ABNORMAL LOW (ref 20.0–28.0)
Bicarbonate: 15.5 mmol/L — ABNORMAL LOW (ref 20.0–28.0)
Calcium, Ion: 1.02 mmol/L — ABNORMAL LOW (ref 1.15–1.40)
Calcium, Ion: 1.01 mmol/L — ABNORMAL LOW (ref 1.15–1.40)
HCT: 35 % — ABNORMAL LOW (ref 36.0–46.0)
HCT: 40 % (ref 36.0–46.0)
Hemoglobin: 11.9 g/dL — ABNORMAL LOW (ref 12.0–15.0)
Hemoglobin: 13.6 g/dL (ref 12.0–15.0)
O2 Saturation: 98 %
O2 Saturation: 98 %
Patient temperature: 98.7
Patient temperature: 97.8
Potassium: 5 mmol/L (ref 3.5–5.1)
Potassium: 5 mmol/L (ref 3.5–5.1)
Sodium: 142 mmol/L (ref 135–145)
Sodium: 144 mmol/L (ref 135–145)
TCO2: 7 mmol/L — ABNORMAL LOW (ref 22–32)
TCO2: 16 mmol/L — ABNORMAL LOW (ref 22–32)
pCO2 arterial: 18.3 mmHg — CL (ref 32.0–48.0)
pCO2 arterial: 22.1 mmHg — ABNORMAL LOW (ref 32.0–48.0)
pH, Arterial: 7.167 — CL (ref 7.350–7.450)
pH, Arterial: 7.452 — ABNORMAL HIGH (ref 7.350–7.450)
pO2, Arterial: 125 mmHg — ABNORMAL HIGH (ref 83.0–108.0)
pO2, Arterial: 92 mmHg (ref 83.0–108.0)

## 2021-08-20 LAB — CBC WITH DIFFERENTIAL/PLATELET
Abs Immature Granulocytes: 1.4 10*3/uL — ABNORMAL HIGH (ref 0.00–0.07)
Basophils Absolute: 0.2 10*3/uL — ABNORMAL HIGH (ref 0.0–0.1)
Basophils Relative: 1 %
Eosinophils Absolute: 0 10*3/uL (ref 0.0–0.5)
Eosinophils Relative: 0 %
HCT: 42.2 % (ref 36.0–46.0)
Hemoglobin: 13.1 g/dL (ref 12.0–15.0)
Immature Granulocytes: 4 %
Lymphocytes Relative: 5 %
Lymphs Abs: 1.8 10*3/uL (ref 0.7–4.0)
MCH: 27.1 pg (ref 26.0–34.0)
MCHC: 31 g/dL (ref 30.0–36.0)
MCV: 87.4 fL (ref 80.0–100.0)
Monocytes Absolute: 0.7 10*3/uL (ref 0.1–1.0)
Monocytes Relative: 2 %
Neutro Abs: 28.9 10*3/uL — ABNORMAL HIGH (ref 1.7–7.7)
Neutrophils Relative %: 88 %
Platelets: 468 10*3/uL — ABNORMAL HIGH (ref 150–400)
RBC: 4.83 MIL/uL (ref 3.87–5.11)
RDW: 17.8 % — ABNORMAL HIGH (ref 11.5–15.5)
WBC: 32.9 10*3/uL — ABNORMAL HIGH (ref 4.0–10.5)
nRBC: 8 % — ABNORMAL HIGH (ref 0.0–0.2)

## 2021-08-20 LAB — BASIC METABOLIC PANEL
Anion gap: 11 (ref 5–15)
BUN: 55 mg/dL — ABNORMAL HIGH (ref 8–23)
CO2: 14 mmol/L — ABNORMAL LOW (ref 22–32)
Calcium: 7.3 mg/dL — ABNORMAL LOW (ref 8.9–10.3)
Chloride: 115 mmol/L — ABNORMAL HIGH (ref 98–111)
Creatinine, Ser: 2.16 mg/dL — ABNORMAL HIGH (ref 0.44–1.00)
GFR, Estimated: 24 mL/min — ABNORMAL LOW (ref >60)
Glucose, Bld: 131 mg/dL — ABNORMAL HIGH (ref 70–99)
Potassium: 4.7 mmol/L (ref 3.5–5.1)
Sodium: 140 mmol/L (ref 135–145)

## 2021-08-20 LAB — LACTIC ACID, PLASMA: Lactic Acid, Venous: 5 mmol/L (ref 0.5–1.9)

## 2021-08-20 LAB — GLUCOSE, CAPILLARY
Glucose-Capillary: 103 mg/dL — ABNORMAL HIGH (ref 70–99)
Glucose-Capillary: 133 mg/dL — ABNORMAL HIGH (ref 70–99)
Glucose-Capillary: 147 mg/dL — ABNORMAL HIGH (ref 70–99)
Glucose-Capillary: 191 mg/dL — ABNORMAL HIGH (ref 70–99)
Glucose-Capillary: 58 mg/dL — ABNORMAL LOW (ref 70–99)
Glucose-Capillary: 75 mg/dL (ref 70–99)

## 2021-08-20 LAB — PHOSPHORUS: Phosphorus: 4.5 mg/dL (ref 2.5–4.6)

## 2021-08-20 LAB — PROCALCITONIN: Procalcitonin: 3.41 ng/mL

## 2021-08-20 LAB — MRSA NEXT GEN BY PCR, NASAL: MRSA by PCR Next Gen: NOT DETECTED

## 2021-08-20 LAB — MAGNESIUM: Magnesium: 2.1 mg/dL (ref 1.7–2.4)

## 2021-08-20 LAB — URINE CULTURE: Culture: <10000 — AB

## 2021-08-20 LAB — CK: Total CK: 58 U/L (ref 38–234)

## 2021-08-20 IMAGING — DX DG ABD PORTABLE 1V
1 series · 1 of 1 positions shown · non-contrast
Comparison: Chest radiograph [DATE].

CLINICAL DATA: Encounter for feeding tube placement [5R]
([5R]-CM)

EXAM:
PORTABLE ABDOMEN - 1 VIEW

[abdomen kub]
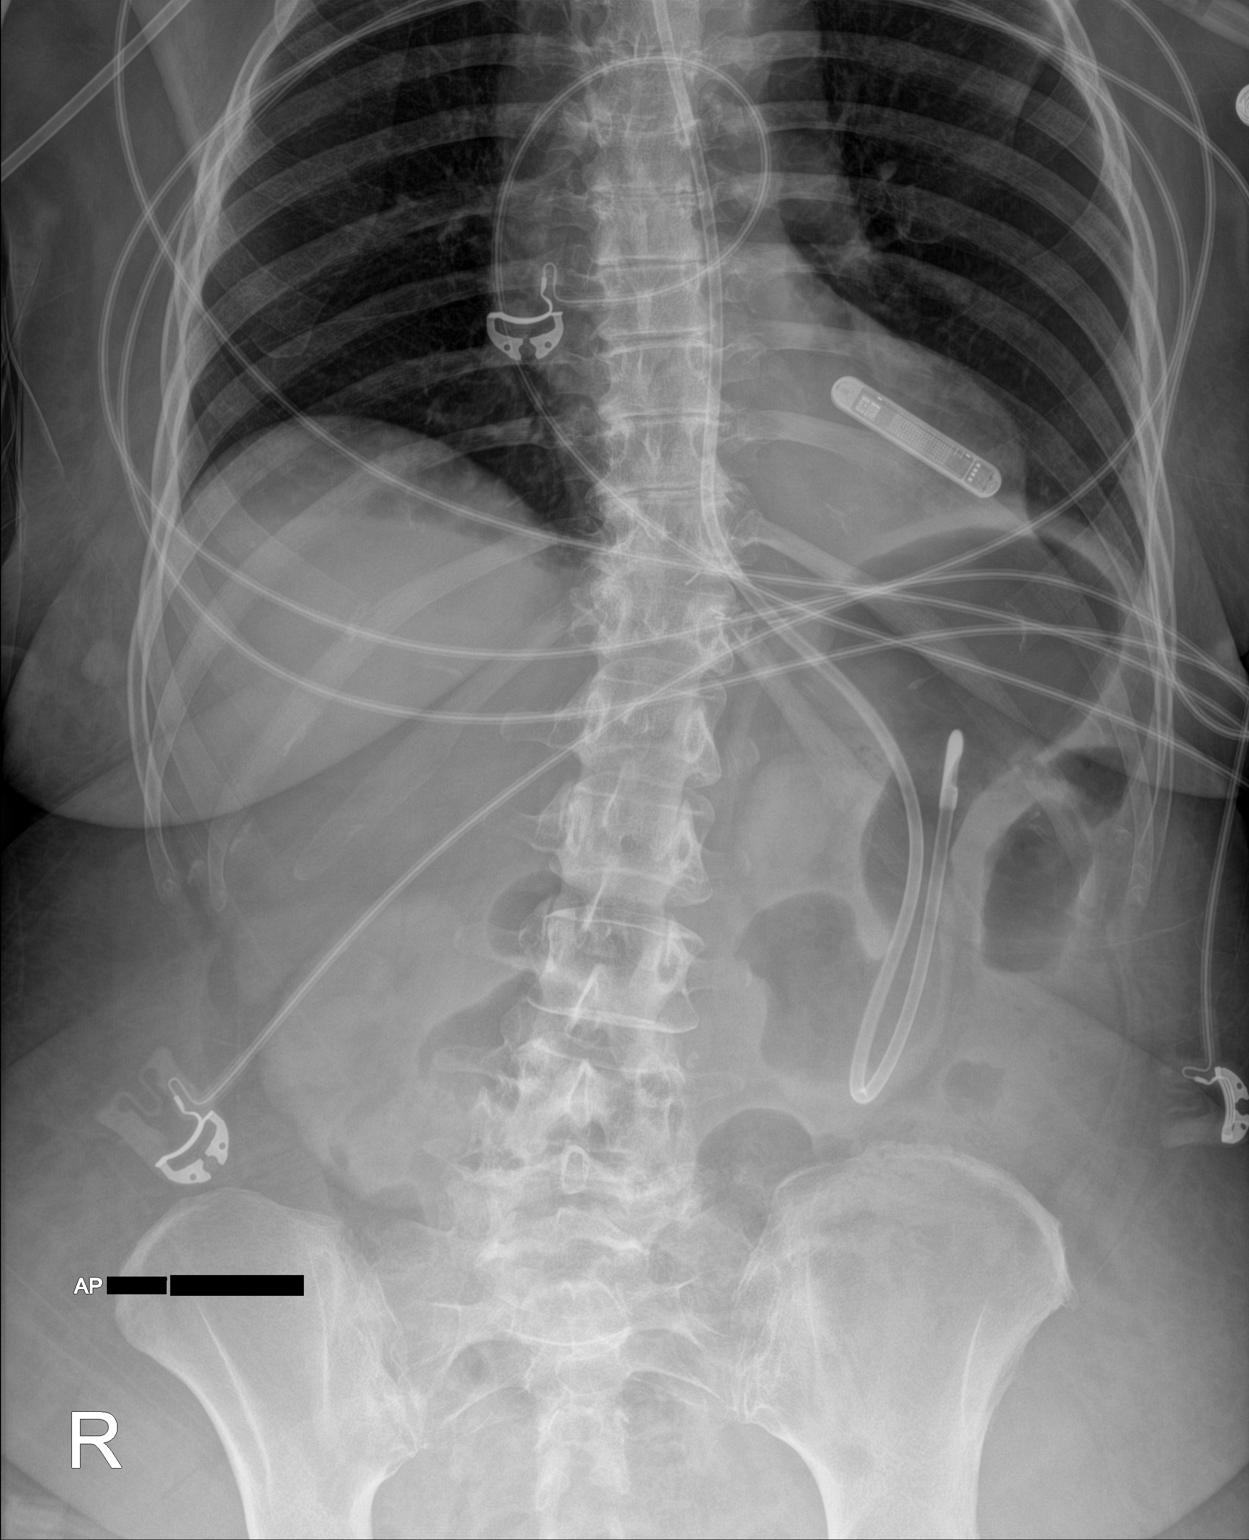

[1 of 1 positions shown; findings below may reference images not displayed]

FINDINGS: Feeding tube courses below the diaphragm and is looped back on
itself in the stomach. Paucity of bowel gas without evidence of
dilated bowel loops. Visualized lung bases are clear. Loop recorder
projects over the left lower chest. Facet arthropathy in the lower
lumbar spine. Broad rotatory levocurvature of the thoracic and
lumbar spine.
IMPRESSION: Feeding tube courses below the diaphragm and is looped back on
itself in the stomach.

## 2021-08-20 IMAGING — DX DG ABD PORTABLE 1V
1 series · 1 of 1 positions shown · non-contrast
Comparison: [DATE] at [ZR] hours

CLINICAL DATA: Encounter for feeding tube placement.

EXAM:
PORTABLE ABDOMEN - 1 VIEW

[abdomen kub]
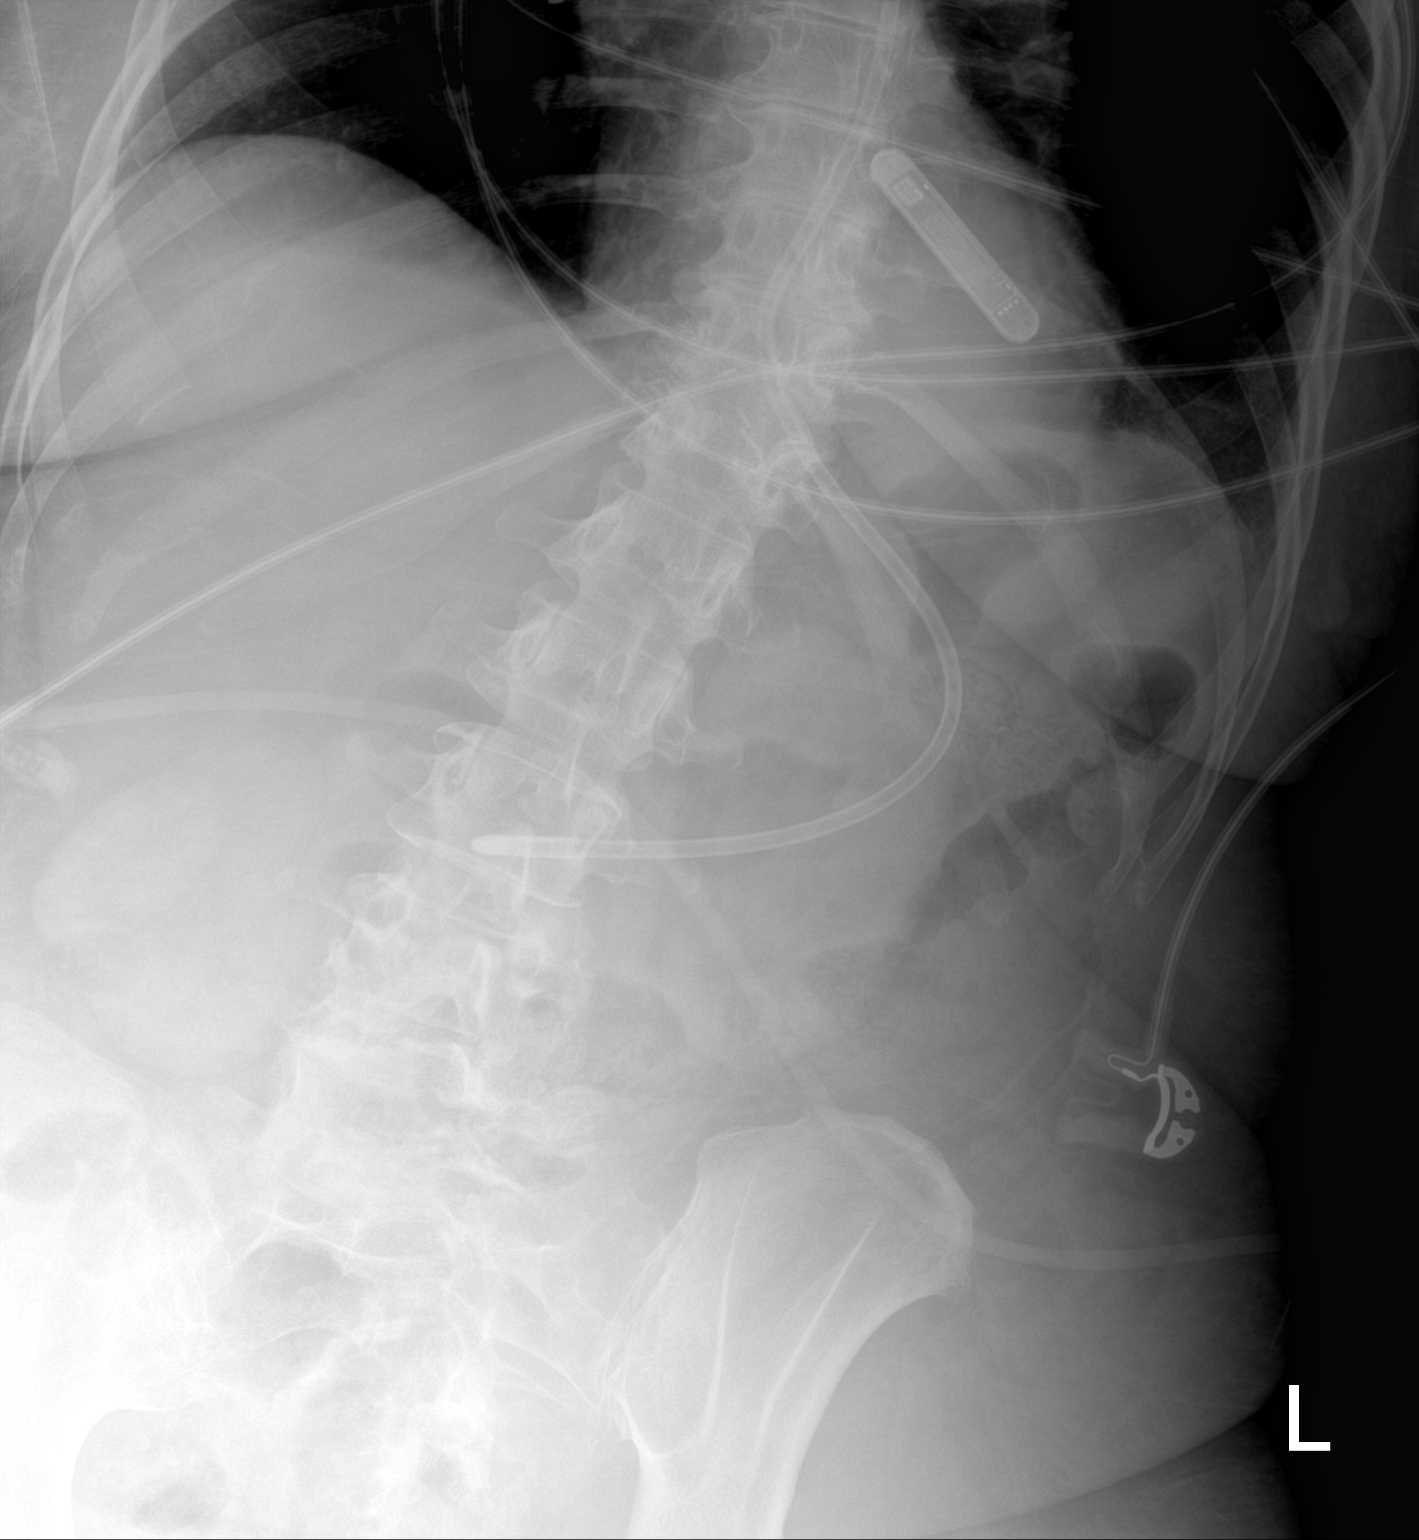

[1 of 1 positions shown; findings below may reference images not displayed]

FINDINGS: There is a feeding tube in the abdomen. The tip is in the gastric
antrum region. Cardiac loop recorder is identified in the lower
chest. Nonobstructive bowel gas pattern.
IMPRESSION: Feeding tube tip is in the gastric antrum region.

## 2021-08-20 MED ORDER — ORAL CARE MOUTH RINSE
15.0000 mL | Freq: Two times a day (BID) | OROMUCOSAL | Status: DC
  Administered 2021-08-21: 15 mL via OROMUCOSAL

## 2021-08-20 MED ORDER — ALBUMIN HUMAN 25 % IV SOLN
12.5000 g | Freq: Once | INTRAVENOUS | Status: AC
  Administered 2021-08-20: 12.5 g via INTRAVENOUS
  Filled 2021-08-20: qty 50

## 2021-08-20 MED ORDER — ACETAMINOPHEN 325 MG PO TABS: 650.0000 mg | ORAL_TABLET | Freq: Four times a day (QID) | ORAL | Status: DC | PRN

## 2021-08-20 MED ORDER — DOCUSATE SODIUM 50 MG/5ML PO LIQD
100.0000 mg | Freq: Two times a day (BID) | ORAL | Status: DC
  Administered 2021-08-20: 100 mg
  Filled 2021-08-20: qty 10

## 2021-08-20 MED ORDER — FENTANYL CITRATE (PF) 100 MCG/2ML IJ SOLN
INTRAMUSCULAR | Status: AC
  Filled 2021-08-20: qty 2

## 2021-08-20 MED ORDER — ACETAMINOPHEN 650 MG RE SUPP: 650.0000 mg | Freq: Four times a day (QID) | RECTAL | Status: DC | PRN

## 2021-08-20 MED ORDER — VANCOMYCIN HCL 1250 MG/250ML IV SOLN
1250.0000 mg | Freq: Once | INTRAVENOUS | Status: AC
  Administered 2021-08-20: 1250 mg via INTRAVENOUS
  Filled 2021-08-20: qty 250

## 2021-08-20 MED ORDER — OSMOLITE 1.5 CAL PO LIQD
1000.0000 mL | ORAL | Status: DC
  Administered 2021-08-20: 1000 mL
  Filled 2021-08-20: qty 1000

## 2021-08-20 MED ORDER — SODIUM BICARBONATE 8.4 % IV SOLN: 50.0000 meq | Freq: Once | INTRAVENOUS | Status: DC

## 2021-08-20 MED ORDER — SODIUM BICARBONATE 8.4 % IV SOLN
INTRAVENOUS | Status: AC
  Filled 2021-08-20: qty 100

## 2021-08-20 MED ORDER — PANTOPRAZOLE SODIUM 40 MG IV SOLR: 40.0000 mg | INTRAVENOUS | Status: DC

## 2021-08-20 MED ORDER — PIPERACILLIN-TAZOBACTAM 3.375 G IVPB
3.3750 g | Freq: Three times a day (TID) | INTRAVENOUS | Status: DC
  Administered 2021-08-20 – 2021-08-21 (×4): 3.375 g via INTRAVENOUS
  Filled 2021-08-20 (×6): qty 50

## 2021-08-20 MED ORDER — ASPIRIN 81 MG PO CHEW
81.0000 mg | CHEWABLE_TABLET | Freq: Every day | ORAL | Status: DC
  Administered 2021-08-21: 81 mg
  Filled 2021-08-20: qty 1

## 2021-08-20 MED ORDER — PANTOPRAZOLE SODIUM 40 MG IV SOLR: 40.0000 mg | Freq: Every day | INTRAVENOUS | Status: DC | PRN

## 2021-08-20 MED ORDER — SODIUM BICARBONATE 8.4 % IV SOLN
100.0000 meq | Freq: Once | INTRAVENOUS | Status: AC
  Administered 2021-08-20: 100 meq via INTRAVENOUS

## 2021-08-20 MED ORDER — LEVETIRACETAM IN NACL 500 MG/100ML IV SOLN: 500.0000 mg | Freq: Two times a day (BID) | INTRAVENOUS | Status: DC

## 2021-08-20 MED ORDER — DEXTROSE 50 % IV SOLN
INTRAVENOUS | Status: AC
  Administered 2021-08-20: 25 mL via INTRAVENOUS
  Filled 2021-08-20: qty 50

## 2021-08-20 MED ORDER — CHLORHEXIDINE GLUCONATE CLOTH 2 % EX PADS
6.0000 | MEDICATED_PAD | Freq: Every day | CUTANEOUS | Status: DC
  Administered 2021-08-20 – 2021-08-21 (×3): 6 via TOPICAL

## 2021-08-20 MED ORDER — LEVETIRACETAM IN NACL 1000 MG/100ML IV SOLN
1000.0000 mg | Freq: Once | INTRAVENOUS | Status: AC
  Administered 2021-08-20: 1000 mg via INTRAVENOUS
  Filled 2021-08-20: qty 100

## 2021-08-20 MED ORDER — SODIUM BICARBONATE 8.4 % IV SOLN
INTRAVENOUS | Status: AC
  Filled 2021-08-20: qty 50

## 2021-08-20 MED ORDER — PROSOURCE TF PO LIQD
45.0000 mL | Freq: Every day | ORAL | Status: DC
  Administered 2021-08-20 – 2021-08-21 (×2): 45 mL
  Filled 2021-08-20 (×2): qty 45

## 2021-08-20 MED ORDER — MIDAZOLAM HCL 2 MG/2ML IJ SOLN
INTRAMUSCULAR | Status: AC
  Filled 2021-08-20: qty 2

## 2021-08-20 MED ORDER — VANCOMYCIN HCL IN DEXTROSE 1-5 GM/200ML-% IV SOLN: 1000.0000 mg | INTRAVENOUS | Status: DC

## 2021-08-20 MED ORDER — POLYETHYLENE GLYCOL 3350 17 G PO PACK
17.0000 g | PACK | Freq: Two times a day (BID) | ORAL | Status: DC
  Administered 2021-08-20: 17 g
  Filled 2021-08-20: qty 1

## 2021-08-20 MED ORDER — ETOMIDATE 2 MG/ML IV SOLN
INTRAVENOUS | Status: AC
  Filled 2021-08-20: qty 20

## 2021-08-20 MED ORDER — SODIUM BICARBONATE 8.4 % IV SOLN
INTRAVENOUS | Status: DC
  Filled 2021-08-20 (×4): qty 1000

## 2021-08-20 MED ORDER — ROCURONIUM BROMIDE 10 MG/ML (PF) SYRINGE
PREFILLED_SYRINGE | INTRAVENOUS | Status: AC
  Filled 2021-08-20: qty 10

## 2021-08-20 MED ORDER — DEXTROSE 50 % IV SOLN: 25.0000 mL | Freq: Once | INTRAVENOUS | Status: AC

## 2021-08-20 MED ORDER — CHLORHEXIDINE GLUCONATE 0.12 % MT SOLN
15.0000 mL | Freq: Two times a day (BID) | OROMUCOSAL | Status: DC
  Administered 2021-08-20 – 2021-08-21 (×2): 15 mL via OROMUCOSAL
  Filled 2021-08-20 (×2): qty 15

## 2021-08-20 NOTE — H&P (Signed)
NAME:  Michaela Rios, MRN:  [id number], DOB:  August 25, 1951, LOS: 14 ADMISSION DATE:  07/31/2021, CONSULTATION DATE: 08/20/2021 REFERRING MD: [facility], CHIEF COMPLAINT: Sepsis  History of Present Illness:  70 year old female who has a plethora of health issues which are notable for bilateral strokes for which she has been followed by neurology who have signed off at this time.  She admitted for 9 days and now is spiking fevers, elevated procalcitonin, elevated lactic acid and generalized failure to thrive.  Ongoing discussions with husband results in the full code at this time.  No obvious source of infection is noted she does have a history of UTI with Klebsiella. We will transferred her to the intensive care unit, broaden her antibiotics, bicarbonate drip with further investigation as to source of infection.  Metabolic acidosis being treated with bicarbonate drip.  See below for her extensive past medical history.  Pertinent  Medical History   Past Medical History:  Diagnosis Date   Anemia    Anxiety    Arthritis    Cataract    Chronic migraine    follows with neuro for same   Clotting disorder (HCC)    clot in ovarian vein- hx   Esophagitis    GERD (gastroesophageal reflux disease)    Hypertension    IBS (irritable bowel syndrome)    Low back pain syndrome    MRSA (methicillin resistant Staphylococcus aureus) infection 04/17/12   left torso   Peptic ulcer disease 2003, 12/2012   Peripheral vascular disease ([place])    Vitamin D deficiency      Significant Hospital Events: Including procedures, antibiotic start and stop dates in addition to other pertinent events   08/20/2021 transferred intensive care unit  Interim History / Subjective:  70 year old female is been in the hospital 9 days with multiple admissions in the past and generally in the hospital since June 2022.  Now with fevers, reported hypotension, elevated procalcitonin, lactic acid greater than 5.  Pulmonary critical care  called to the bedside for reported hypotension she is obviously in distress and will be transferred to the intensive care unit at this time on 9 23 2021-07-27.  There is a good chance she will require intubation in the near future and she proved refractory to treatment in the past.  Her antibiotics have been broadened to vancomycin and Zosyn he has been pancultured she has a history of bilateral strokes for which she was in rehab in June 2022.  Her past medical history is extensive.  Objective   Blood pressure (!) 81/68, pulse (!) 130, temperature 99.5 F (37.5 C), temperature source Rectal, resp. rate (!) 27, height 5\' 6"  (1.676 m), weight 57 kg, SpO2 95 %.        Intake/Output Summary (Last 24 hours) at 08/20/2021 0827 Last data filed at 08/20/2021 0610 Gross per 24 hour  Intake 2255.42 ml  Output 100 ml  Net 2155.42 ml   Filed Weights   08/11/21 2312 08/14/21 0400 08/15/21 0455  Weight: 54.5 kg 55.1 kg 57 kg    Examination: General: Frail elderly female poorly responsive HENT: Pupils are equal reactive to light.  Oropharynx extremely dry.  No JVD or lymphadenopathy is appreciated.  Questionable right-sided facial droop Lungs: Distant but clear Cardiovascular: Heart sounds are regular with sinus tach 136 Abdomen: Soft nontender formed stool was noted at the rectum Extremities: Cool extremities Neuro: Will open eyes to voice will answer simple questions will squeeze bilaterally but extremely weak.  Questionable left-sided  weakness. GU: Voids  Resolved Hospital Problem list     Assessment & Plan:  Altered mental status in the setting of bilateral strokes with metabolic disarray secondary to sepsis with elevated procalcitonin. Transferred to the intensive care unit Neurology has signed off Continue to monitor mental status for improvement when correction of metabolic disarray. Stop all sedating medications  Metabolic acidosis in the setting of sepsis of unknown  source. Bicarbonate drip Transferred to intensive care unit Evaluate for sources of ischemia Monitor lactic acid  Sepsis of unknown source with elevated procalcitonin leukocytosis. Broaden antibiotics to vancomycin and Zosyn 08/20/2021 Transferred intensive care unit May need an arterial line for accurate blood pressures Consider central line for volume resuscitation and monitoring central venous pressure Pancultured She may need intubation in near future due to worsening mental status and organ failure.  Acute on chronic renal insufficiency Lab Results  Component Value Date   CREATININE 2.16 (H) 08/20/2021   CREATININE 1.70 (H) 08/19/2021   CREATININE 1.07 (H) 08/18/2021   Full resuscitation with fluids Monitor creatinine Avoid nephrotoxins  Generalized failure to thrive in person he has been hospitalized since June 2022 with multiple strokes multiple admissions and has continued decline despite all interventions.  Ongoing discussions with husband I suspect she will be intubated in the near future illness her CODE STATUS is changed.   Best Practice (right click and "Reselect all SmartList Selections" daily)   Diet/type: NPO DVT prophylaxis: LMWH GI prophylaxis: PPI Lines: N/A Foley:  N/A Code Status:  full code Last date of multidisciplinary goals of care discussion acidosis Note attempted to call husband 08/20/2021 mobile and home phone without answer. Labs   CBC: Recent Labs  Lab 08/15/21 0055 08/16/21 0249 08/17/21 0505 08/18/21 0059 08/19/21 1019 08/20/21 0417  WBC 11.2* 10.3 13.4* 10.7* 20.2* 32.9*  NEUTROABS 9.7* 8.8* 11.8* 9.0*  --  28.9*  HGB 11.6* 12.2 12.4 13.3 14.6 13.1  HCT 35.3* 38.0 39.1 40.6 45.0 42.2  MCV 84.0 83.7 85.0 84.1 84.3 87.4  PLT 391 413* 435* 414* 469* 468*    Basic Metabolic Panel: Recent Labs  Lab 08/15/21 0055 08/16/21 0249 08/17/21 0505 08/18/21 0059 08/19/21 1019 08/20/21 0417  NA 137 140 138 139 139 140  K 3.9 3.8  3.5 4.1 5.1 4.7  CL 109 112* 108 111 112* 115*  CO2 19* 19* 20* 18* 15* 14*  GLUCOSE 101* 109* 135* 133* 126* 131*  BUN 26* 20 23 31* 46* 55*  CREATININE 0.77 0.69 0.85 1.07* 1.70* 2.16*  CALCIUM 8.2* 8.2* 8.2* 8.2* 8.1* 7.3*  MG 1.9 1.7 1.8 2.1  --   --    GFR: Estimated Creatinine Clearance: 21.8 mL/min (A) (by C-G formula based on SCr of 2.16 mg/dL (H)). Recent Labs  Lab 08/16/21 0713 08/16/21 1015 08/17/21 0505 08/18/21 0059 08/19/21 0049 08/19/21 1019 08/19/21 2204 08/20/21 0417  PROCALCITON  --   --  0.55 1.14 1.80  --   --  3.41  WBC  --   --  13.4* 10.7*  --  20.2*  --  32.9*  LATICACIDVEN 1.6 2.0*  --   --   --   --  4.9* 5.0*    Liver Function Tests: Recent Labs  Lab 08/15/21 0055 08/16/21 0249 08/17/21 0505 08/18/21 0059  AST 24 26 31  36  ALT 11 11 12 10   ALKPHOS 41 41 38 39  BILITOT 0.6 0.5 0.4 0.3  PROT 5.5* 5.2* 5.0* 4.9*  ALBUMIN 2.0* 1.9* 1.8* 1.9*  No results for input(s): LIPASE, AMYLASE in the last 168 hours. No results for input(s): AMMONIA in the last 168 hours.  ABG    Component Value Date/Time   PHART 7.403 08/14/2021 0110   PCO2ART 30.2 (L) 08/14/2021 0110   PO2ART 82.5 (L) 08/14/2021 0110   HCO3 18.1 (L) 08/14/2021 0110   TCO2 23 08/18/2020 0021   ACIDBASEDEF 5.5 (H) 08/14/2021 0110   O2SAT 94.7 08/14/2021 0110     Coagulation Profile: Recent Labs  Lab 08/14/21 0138  INR 1.8*    Cardiac Enzymes: Recent Labs  Lab 08/20/21 0417  CKTOTAL 58    HbA1C: Hgb A1c MFr Bld  Date/Time Value Ref Range Status  05/27/2021 01:06 AM 5.9 (H) 4.8 - 5.6 % Final    Comment:    (NOTE)         Prediabetes: 5.7 - 6.4         Diabetes: >6.4         Glycemic control for adults with diabetes: <7.0   04/07/2021 02:50 PM 5.8 4.6 - 6.5 % Final    Comment:    Glycemic Control Guidelines for People with Diabetes:Non Diabetic:  <6%Goal of Therapy: <7%Additional Action Suggested:  >8%     CBG: Recent Labs  Lab 08/14/21 2334  GLUCAP 96     Review of Systems:   NA  Past Medical History:  She,  has a past medical history of Anemia, Anxiety, Arthritis, Cataract, Chronic migraine, Clotting disorder ([place]), Esophagitis, GERD (gastroesophageal reflux disease), Hypertension, IBS (irritable bowel syndrome), Low back pain syndrome, MRSA (methicillin resistant Staphylococcus aureus) infection (04/17/12), Peptic ulcer disease (2003, 12/2012), Peripheral vascular disease ([place]), and Vitamin D deficiency.   Surgical History:   Past Surgical History:  Procedure Laterality Date   ABDOMINAL HYSTERECTOMY     BUBBLE STUDY  06/03/2021   Procedure: BUBBLE STUDY;  Surgeon: Donato Heinz, MD;  Location: [facility] ENDOSCOPY;  Service: Cardiovascular;;   IR ANGIO EXTERNAL CAROTID SEL EXT CAROTID UNI R MOD SED  06/01/2021   IR ANGIO INTRA EXTRACRAN SEL COM CAROTID INNOMINATE UNI L MOD SED  06/01/2021   IR ANGIO INTRA EXTRACRAN SEL INTERNAL CAROTID UNI R MOD SED  06/01/2021   IR ANGIO VERTEBRAL SEL VERTEBRAL UNI R MOD SED  06/01/2021   IR US GUIDE VASC ACCESS RIGHT  06/01/2021   LAPAROSCOPIC BILATERAL SALPINGO OOPHERECTOMY     LOOP RECORDER INSERTION N/A 06/08/2021   Procedure: LOOP RECORDER INSERTION;  Surgeon: Constance Haw, MD;  Location: [facility];  Service: Cardiovascular;  Laterality: N/A;   pud surgery w/duod sticture-plasty and vagotomy  2003   Dr. Marlou Starks   TEE WITHOUT CARDIOVERSION N/A 06/03/2021   Procedure: TRANSESOPHAGEAL ECHOCARDIOGRAM (TEE);  Surgeon: Donato Heinz, MD;  Location: [facility];  Service: Cardiovascular;  Laterality: N/A;   TONSILLECTOMY AND ADENOIDECTOMY     as a child   TUBAL LIGATION  1984     Social History:   reports that she has never smoked. She has never used smokeless tobacco. She reports current alcohol use. She reports that she does not use drugs.   Family History:  Her family history includes Aneurysm in her father; Lung cancer in her brother; Prostate cancer in her brother. There is no  history of Colon cancer, Esophageal cancer, Rectal cancer, or Stomach cancer.   Allergies Allergies  Allergen Reactions   Amitriptyline Hcl     Changed personality   Shellfish Allergy Nausea And Vomiting and Other (See Comments)  Tested "high" on allergy test   Lisinopril Cough    .   Amoxicillin Rash   Cephalexin Rash   Penicillins Rash     Home Medications  Prior to Admission medications   Medication Sig Start Date End Date Taking? Authorizing Provider  acetaminophen (TYLENOL) 650 MG CR tablet Take 1 tablet (650 mg total) by mouth every 8 (eight) hours as needed for pain. 06/30/21  Yes Love, Ivan Anchors, PA-C  amLODipine (NORVASC) 5 MG tablet TAKE ONE TABLET BY MOUTH EVERY MORNING Patient taking differently: Take 5 mg by mouth daily. 07/17/21  Yes Binnie Rail, MD  aspirin EC 81 MG EC tablet Take 1 tablet (81 mg total) by mouth daily. Swallow whole. 06/09/21  Yes [place] Eric J, DO  buPROPion (WELLBUTRIN XL) 300 MG 24 hr tablet TAKE ONE TABLET BY MOUTH EVERY MORNING Patient taking differently: Take 300 mg by mouth daily. 01/29/21  Yes Burns, Claudina Lick, MD  butalbital-acetaminophen-caffeine (FIORICET) [phone] MG tablet Take 1 tablet by mouth every 6 (six) hours as needed for headache or migraine. 06/30/21  Yes Love, Ivan Anchors, PA-C  calcium carbonate (OS-CAL - DOSED IN MG OF ELEMENTAL CALCIUM) 1250 (500 Ca) MG tablet Take 1 tablet (500 mg of elemental calcium total) by mouth daily with breakfast. 06/09/21  Yes [place] Eric J, DO  EPINEPHrine (EPIPEN 2-PAK) 0.3 mg/0.3 mL IJ SOAJ injection Inject 0.3 mLs (0.3 mg total) into the muscle as needed for anaphylaxis. 08/15/19  Yes Burns, Claudina Lick, MD  furosemide (LASIX) 20 MG tablet Take 20 mg by mouth every morning. 07/23/21  Yes [provider]  hydrALAZINE (APRESOLINE) 25 MG tablet TAKE ONE TABLET BY MOUTH EVERY MORNING and TAKE ONE TABLET BY MOUTH EVERYDAY AT BEDTIME Patient taking differently: Take 25 mg by mouth in the morning and at  bedtime. 01/29/21  Yes Burns, Claudina Lick, MD  metoprolol succinate (TOPROL-XL) 100 MG 24 hr tablet TAKE ONE TABLET BY MOUTH EVERY MORNING WITH OR immediately following A meal Patient taking differently: Take 100 mg by mouth daily. 04/19/21  Yes Burns, Claudina Lick, MD  Multiple Vitamin (MULTIVITAMIN WITH MINERALS) TABS tablet Take 1 tablet by mouth daily. 07/01/21  Yes Love, Ivan Anchors, PA-C  ondansetron (ZOFRAN) 4 MG tablet Take 1 tablet (4 mg total) by mouth every 6 (six) hours as needed for nausea. 08/19/20  Yes Pokhrel, Laxman, MD  pantoprazole (PROTONIX) 40 MG tablet Take 1 tablet (40 mg total) by mouth daily. Patient taking differently: Take 40 mg by mouth daily as needed (heartburn). 06/09/21  Yes [place] Eric J, DO  potassium chloride (KLOR-CON) 10 MEQ tablet TAKE ONE TABLET BY MOUTH ONCE DAILY Patient taking differently: Take 10 mEq by mouth daily. 07/26/21  Yes Burns, Claudina Lick, MD  tiZANidine (ZANAFLEX) 4 MG tablet TAKE ONE TABLET BY MOUTH twice daily AS NEEDED FOR muscle SPASMS Patient taking differently: Take 4 mg by mouth every 12 (twelve) hours as needed for muscle spasms. 06/30/21  Yes Love, Ivan Anchors, PA-C  topiramate (TOPAMAX) 100 MG tablet TAKE ONE TABLET BY MOUTH TWICE DAILY Patient taking differently: Take 100 mg by mouth 2 (two) times daily. 07/26/21  Yes Burns, Claudina Lick, MD  venlafaxine XR (EFFEXOR-XR) 75 MG 24 hr capsule TAKE THREE CAPSULES BY MOUTH EVERY MORNING Patient taking differently: Take 75-150 mg by mouth See admin instructions. Taking 2 capsules ( 150 mg) in the AM and 1 capsule (75 mg) in the PM 01/29/21  Yes Burns, Claudina Lick, MD  vitamin B-12  1000 MCG tablet Take 1 tablet (1,000 mcg total) by mouth daily. 06/09/21  Yes [place] Eric J, DO  zolpidem (AMBIEN) 10 MG tablet TAKE ONE TABLET BY MOUTH EVERYDAY AT BEDTIME AS NEEDED FOR SLEEP Patient taking differently: Take 10 mg by mouth at bedtime as needed for sleep. 04/29/21  Yes Burns, Claudina Lick, MD  LORazepam (ATIVAN) 1 MG tablet Take 1 tablet (1  mg total) by mouth 2 (two) times daily as needed. Patient taking differently: Take 1 mg by mouth 2 (two) times daily as needed for anxiety. 04/29/21   Binnie Rail, MD     Critical care time: 51    Richardson Landry Adalid Beckmann ACNP Acute Care Nurse Practitioner [place] Please consult Amion 08/20/2021, 8:27 AM

## 2021-08-20 NOTE — Progress Notes (Signed)
SLP Cancellation Note  Patient Details Name: Michaela Rios MRN: [id number] DOB: 22-Oct-1951   Cancelled treatment:       Reason Eval/Treat Not Completed: Medical issues which prohibited therapy (Pt has been transferred to ICU and is now NPO. Pt's case was discussed with Dr. Tacy Learn and was agreed that SLP will sign off and that CCM will reconsult when/if clinically indicated.)  Mashelle Busick I. [name], [place], [place] Office number [phone] Pager [place] 08/20/2021, 11:41 AM

## 2021-08-20 NOTE — Progress Notes (Addendum)
Patient tremoring in the head, neck, bilateral shoulders with right shoulder being worse, bilateral upper extremities with RUE being worse. [place] notified. Received orders. Will continue to monitor closely.

## 2021-08-20 NOTE — Progress Notes (Signed)
Pt transferred to 3M05 via bed with [name], RRT RN. Bedside report done with receiving RN Shea Stakes.

## 2021-08-20 NOTE — Significant Event (Signed)
10:51 AM 08/20/2021 Spoke at length with husband Levada Dy Kovacic concerning CODE STATUS. He agreed with short-term intubation and vasopressor support no defibrillation or CPR.  We will continue aggressive measures at this time with further evaluation with him over the coming days.  Richardson Landry Kymir Coles ACNP Acute Care Nurse Practitioner [place] Please consult Amion 08/20/2021, 10:52 AM

## 2021-08-20 NOTE — Progress Notes (Signed)
O2 sat not picking up on monitor, arterial blood gas drawn. Results in chart. Dr. Tacy Learn and Asa Saunas, NP made aware. Will continue to monitor.

## 2021-08-20 NOTE — Procedures (Signed)
Cortrak  Person Inserting Tube:  Michaela Rios, RD Tube Type:  Cortrak - 43 inches Tube Size:  10 Tube Location:  Right nare Initial Placement:  Stomach Secured by: Bridle Technique Used to Measure Tube Placement:  Marking at nare/corner of mouth Cortrak Secured At:  67 cm  Cortrak Tube Team Note:  Consult received to place a Cortrak feeding tube.   X-ray is required, abdominal x-ray has been ordered by the Cortrak team. Please confirm tube placement before using the Cortrak tube.   If the tube becomes dislodged please keep the tube and contact the Cortrak team at [email or web address] ([email or web address]) for replacement.  If after hours and replacement cannot be delayed, place a NG tube and confirm placement with an abdominal x-ray.   Kerman Passey MS, RDN, LDN, CNSC Registered Dietitian III Clinical Nutrition RD Pager and On-Call Pager Number Located in [place]

## 2021-08-20 NOTE — Consult Note (Addendum)
Pharmacy Antibiotic Note  Michaela Rios is a 70 y.o. female admitted on 08/09/2021 with sepsis 2/2 UTI.  Treated with abx x6 days. Fever increased as well as LA (5.0). Pharmacy has been consulted for vancomycin and Zosyn dosing for sepsis.  Plan: Vancomycin 1250 mg IV x1, then 1000 mg IV every 48 hours. Goal AUC 400-550.  Expected AUC: 541.5 Scr used: 2.16  Zosyn 3.375 g IV q8h  Monitor clinical progress, renal function, Culture results daily   Height: 5\' 6"  (167.6 cm) Weight: 57 kg (125 lb 10.6 oz) IBW/kg (Calculated) : 59.3  Temp (24hrs), Avg:99.7 F (37.6 C), Min:98.7 F (37.1 C), Max:100.8 F (38.2 C)  Recent Labs  Lab 08/16/21 0249 08/16/21 0713 08/16/21 1015 08/17/21 0505 08/18/21 0059 08/19/21 1019 08/19/21 2204 08/20/21 0417  WBC 10.3  --   --  13.4* 10.7* 20.2*  --  32.9*  CREATININE 0.69  --   --  0.85 1.07* 1.70*  --  2.16*  LATICACIDVEN  --  1.6 2.0*  --   --   --  4.9* 5.0*    Estimated Creatinine Clearance: 21.8 mL/min (A) (by C-G formula based on SCr of 2.16 mg/dL (H)).    Allergies  Allergen Reactions   Amitriptyline Hcl     Changed personality   Shellfish Allergy Nausea And Vomiting and Other (See Comments)    Tested "high" on allergy test   Lisinopril Cough    .   Amoxicillin Rash   Cephalexin Rash   Penicillins Rash    Antimicrobials this admission: Vancomycin 9/13 >> 9/14  Aztreonam 9/13 >> 9/14 Flagyl x1 in ED Ceftriaxone 9/15 >> 9/18 Ceftriaxone 9/22 >> 9/23  Dose adjustments this admission: N/A  Microbiology results: 9/13 BCx: neg 9/13 UCx:  30K kleb pneumo (R-amp, I-NTF) 9/17 BCx: ngtd x4 days 9/17 UCx: neg 9/20 UCX: neg 9/22 BCx: pending 9/22 UCx: pending  Thank you for allowing pharmacy to be a part of this patient's care.  Freddie Breech 08/20/2021 8:28 AM

## 2021-08-20 NOTE — Progress Notes (Signed)
OT Cancellation Note  Patient Details Name: Michaela Rios MRN: [id number] DOB: Apr 16, 1951   Cancelled Treatment:    Reason Eval/Treat Not Completed: Medical issues which prohibited therapy  Patient was transferred to ICU and nursing states she is very lethargic.  Trixie Dredge 08/20/2021, 3:47 PM

## 2021-08-20 NOTE — Procedures (Signed)
Arterial Catheter Insertion Procedure Note  Michaela Rios  [id number]  04/21/1951  Date:08/20/21  Time:12:27 PM    Provider Performing: [facility] Standard    Procedure: Insertion of Arterial Line [id number]) without US guidance  Indication(s) Blood pressure monitoring and/or need for frequent ABGs  Consent Risks of the procedure as well as the alternatives and risks of each were explained to the patient and/or caregiver.  Consent for the procedure was obtained and is signed in the bedside chart  Anesthesia None   Time Out Verified patient identification, verified procedure, site/side was marked, verified correct patient position, special equipment/implants available, medications/allergies/relevant history reviewed, required imaging and test results available.   Sterile Technique Maximal sterile technique including full sterile barrier drape, hand hygiene, sterile gown, sterile gloves, mask, hair covering, sterile ultrasound probe cover (if used).   Procedure Description Area of catheter insertion was cleaned with chlorhexidine and draped in sterile fashion. Without real-time ultrasound guidance an arterial catheter was placed into the right radial artery.  Appropriate arterial tracings confirmed on monitor.     Complications/Tolerance None; patient tolerated the procedure well.   EBL Minimal   Specimen(s) None

## 2021-08-20 NOTE — Progress Notes (Signed)
Initial Nutrition Assessment  DOCUMENTATION CODES:   Severe malnutrition in context of acute illness/injury  INTERVENTION:   Initiate tube feeds via Cortrak: - Start Osmolite 1.5 @ 20 ml/hr and advance by 10 ml q 8 hours to goal rate of 45 ml/hr (1080 ml/day) - ProSource TF 45 ml daily  Tube feeding regimen at goal provides 1660 kcal, 79 grams of protein, and 823 ml of H2O.  Monitor magnesium, potassium, and phosphorus BID for at least 3 days, MD to replete as needed, as pt is at risk for refeeding syndrome given severe acute malnutrition.  NUTRITION DIAGNOSIS:   Severe Malnutrition related to acute illness (sepsis of unknown source) as evidenced by moderate fat depletion, moderate muscle depletion, energy intake < or equal to 50% for > or equal to 5 days.  GOAL:   Patient will meet greater than or equal to 90% of their needs  MONITOR:   Diet advancement, Labs, Weight trends, TF tolerance, I & O's  REASON FOR ASSESSMENT:   Consult Enteral/tube feeding initiation and management  ASSESSMENT:   70 year old female who presented to the ED on 9/13 with progressive weakness and fever. PMH of recent CVA (discharged 06/04/21), HTN, depression, anxiety, CKD stage III, PVD. Pt admitted with fever of unknown origin.  9/14 - SLP recommending dysphagia 2 diet with thin liquids 9/15 - diet advanced to regular with thin liquids 9/16 - diet downgraded to dysphagia 2 with thin liquids 9/22 - diet downgraded to dysphagia 1 with thin liquids 9/23 - NPO, transferred to ICU, Cortrak placed (tip gastric)  Discussed pt with RN and during ICU rounds. Per CCM, pt with sepsis due to unknown source of infection. Pt also with acute metabolic encephalopathy and AKI on CKD stage IIIa.  Consult received for tube feeding initiation and management. Cortrak placed today, tip gastric. Will start tube feeds at trickle rate and slowly advance to goal. Suspect pt will experience refeeding symptom.  Spoke with  pt at bedside. Pt reports poor appetite and weight loss. She does not open her eyes during RD visit. History obtained from pt is limited. Pt has been eating poorly since admitted to the hospital 10 days ago.  Reviewed weight history in chart. Pt with a 5.6 kg weight loss since 08/19/20. This is an 8.9% weight loss in 1 year which is not quite significant for timeframe. Suspect weight loss is more significant; however, pt with moderate edema.  Admit weight: 54.5 kg Current weight: 57 kg  Meal Completion: 0-95% x last 8 documented meals  Medications reviewed and include: colace, miralax, IV abx, sodium bicarb @ 100 ml/hr  Labs reviewed: BUN 55, creatinine 2.16, ionized calcium 1.02, lactic acid 5.0 CBG's: 58-75  UOP: 100 ml x 24 hours I/O's: +9.7 L since admit  NUTRITION - FOCUSED PHYSICAL EXAM:  Flowsheet Row Most Recent Value  Orbital Region Moderate depletion  Upper Arm Region Moderate depletion  Thoracic and Lumbar Region Moderate depletion  Buccal Region Moderate depletion  Temple Region Moderate depletion  Clavicle Bone Region Moderate depletion  Clavicle and Acromion Bone Region Severe depletion  Scapular Bone Region Moderate depletion  Dorsal Hand Moderate depletion  Patellar Region Moderate depletion  [more severe depletions likely present but masked by edema]  Anterior Thigh Region Moderate depletion  Posterior Calf Region Moderate depletion  Edema (RD Assessment) Moderate  [BUE, BLE]  Hair Reviewed  Eyes Unable to assess  Mouth Reviewed  Skin Reviewed  Nails Reviewed       Diet Order:  Diet Order             Diet NPO time specified  Diet effective now                   EDUCATION NEEDS:   Not appropriate for education at this time  Skin:  Skin Assessment: Reviewed RN Assessment  Last BM:  08/20/21 medium type 6  Height:   Ht Readings from Last 1 Encounters:  08/27/2021 5\' 6"  (1.676 m)    Weight:   Wt Readings from Last 1 Encounters:   08/15/21 57 kg    BMI:  Body mass index is 20.28 kg/m.  Estimated Nutritional Needs:   Kcal:  1500-1700  Protein:  70-85 grams  Fluid:  1.5-1.7 L    Gustavus Bryant, MS, RD, LDN Inpatient Clinical Dietitian Please see AMiON for contact information.

## 2021-08-20 NOTE — Progress Notes (Signed)
Called husband at home and on cell phone to update on changes in her condition, no answer. Michaela Rios

## 2021-08-20 NOTE — Progress Notes (Signed)
[facility] PROGRESS NOTE    LAVINE HARGROVE  OZH:[id number] DOB: 1951/06/07 DOA: 08/02/2021 PCP: Binnie Rail, MD      Brief Narrative:  Mrs. Tortorelli is a 70 y.o. F with HTN, CKD IIIa, and PVD as well as recent admission for stroke and FUO presented again with fever and weakness.  History of current illness begins late June (prior to which patient was independent, no dementia, lived with husband, normal with ADLs):  6/27: Initially presented w/ 1 week urinary symptoms, generalized weakness -> temp 101F, HR 140s in the ER, started on treatment for sepsis 6/29: Still encephalopathic out of proportion to expected for sepsis, CT head obtained, showed abnormal signal in BGs -> f/u MRI showed acute right BG stroke, acute left BG/thalamus/caudate T2 hyperintensity, neuro consulted, no focal weakness at that time 6/30: first mention of left sided weakness on exam; LP this date, culture negative, no WBC, Crypto neg, HSV PCR neg, protein elevated and 22 RBCs in tubes 1 and 4, not enough fluid for flow cytometry 7/2: Recurrent fever, ID consulted for FUO 7/2-7/5: Work up for systemic vasculitis included ANCA, complements, ANA, anti-dsDNA, Ro/La Abs, RF.  All negative except ANA+, CRP ~7.  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 The patient's fever resolved with stopping antibiotics.  She was treated with Solu-medrol 1g for 3 days.    7/5: Cerebral angiography was unremarkable. 7/12: She was discharged to CIR, where she progressed to the point of being interactive, ambulatory with a walker.  8/4: Returned home from [place] where she remained ambulatory, interactive for 1-2 weeks, then started to get worse. 9/4: MRI brain w/wo contrast on 9/4 showed evolution of right BG infarct, no change in L BG T2 hyperintensity, and new  acute/subacute infarcts in the left BG.  Around this time, the patient had worsening generalized weakness, worse PO intake, more somnolence and psychomotor slowing, and husband brought her back to the ER.  Has continued to have intermittent fevers and now with BP not responsive to fluid bolus.  She is now starting the 3rd space with LE edema.  ID has signed off.  Neurology has seen again as well and do not recommend LP.  EEG negative.      Subjective:   Continued to have issues overnight with BP and limited urine out put.  Today c/o some abdominal pain-- seems to localize to her lower abdomen.  Nursing reports several soft BMs.       Assessment & Plan:  Fever - off and on could be neurological vs infectious -had a  LLL Infilterate on 08/14/21: she is high risk for aspiration, SLP following-- given 5 days of Rocephin and Flagyl -CT scan abdomen pelvis with IV contrast was unremarkable, venous duplex of the lower and upper extremities shows no blood clot.    Per ID no clear signs of infection and they have signed off but patient still having fever  -repeat COVID negative -recheck urine- start rocephin for ? UTI- since procalcitonin and LA trending up will broaden abx -smear unrevealing for new information -flow cytometry pending -will need rheum follow up outpatient  -PCCM consult   Hypotension -breifly responds to IVF but I am not sure how accurate her BPs are -she has limited IV access so will need central line -albumin x 1 dose  Acute metabolic encephalopathy -  Could be due to CVA - TSH normal, B12 normal.  She was on 2 central acting agents venlafaxine for anxiety and Topamax for migraines, d/c'd Topamax, Taper and stop venlafaxine. -? Dehydration or infection from unclear source -EEG negative -discussed with neurology, less likely to be neurologic as CVAs were small  Subacute strokes - Left caudate and putamen infarct embolic secondary to likely small vessel disease .  Prior  history of multiple cryptogenic strokes in July 2022 with negative extensive work-up she does have a PFO but at her age she has a low rope score of 2 making PFO unlikely cause of her strokes. DW Dr Erlinda Hong 08/17/21 - CVA unlikely causing fevers. Seen by CVA team, ILR interrogated, this captured no events, Afib unlikely.  (The tachycardia noted on the report is consistent with her observed sinus tachycardia here), per CVA team -  Aspirin and Plavix for 3 weeks then Plavix alone, Continue new atorvastatin, TCD confirms PFO, Leg [place] -ve for DVT.  Torticollis  - per Dr. Candiss Norse thought to be due to CVA, intermittent    Sinus tachycardia  She remains tachycardic, despite abx and IVF -TSH normal so unclear reason  Hypertension   -now with episodes of hypotension, hold BB and give IVF-- BP has been responding to IVF briefly -cortisol elevated so does not appear to have adrenal insuff.   AKI on CKD IIIa  -strict I/O -getting IVF but still limited UOP -suspect pre-renal (dehydration/hypotension)  Chronic migraines - No headaches,  d/c Topamax due to metabolic acidosis  Anxiety - Taper and stop venlafaxine  Hypokalemia -repleted  Anemia, normocytic Mild, asymptomatic  Lung nodule  -65mm incidental nodule noted last admission.  Outpt Pulm follow up.  Severe stool burden with constipation.   -Placed on Bowel regimen.   -recent soft BMs per nursing  PFO.  Cardiology outpatient follow-up for elective closure if family desires.   Discussion with husband regarding GOC.  He says that he has not noticed improvement since she has been admitted over the last 9 days.  Currently he wants to treat all that can be treated for now.  Will discuss NG tube if continues to not eat.    Disposition: Status is: Inpatient  Remains inpatient appropriate because:IV treatments appropriate due to intensity of illness or inability to take PO  Dispo: The patient is from: Home              Anticipated d/c is to: SNF               Patient currently is not medically stable to d/c.   Difficult to place patient No      DVT prophylaxis: enoxaparin (LOVENOX) injection 30 mg Start: 08/19/21 1600  Code Status: FULL     Consultants:   ID Neurology PCCM  Procedures:   9/13 CXR clear 9/14 Lower extremity US DVT -- no DVT 9/14 transcranial doppler with bubbles -- TCD bubble study positive for large right-to-left shunt +ve grade 5 PFO - DW Dr Erlinda Hong 08/14/21 CT head 9/17 - non acute Venous US upper extremities - no DVT CT abd Pelvis IV Contrast - constipation CT chest      Objective: Vitals:   08/20/21 0153 08/20/21 0410 08/20/21 0615 08/20/21 0700  BP: 103/85 (!) 83/63 92/77 102/84  Pulse:      Resp: (!) 26 (!) 23 (!) 24 (!) 24  Temp: 100 F (37.8 C) (P) 99.5 F (37.5 C) 99.5 F (37.5 C)   TempSrc: Rectal Rectal  Rectal   SpO2: 98% 95%    Weight:      Height:        Intake/Output Summary (Last 24 hours) at 08/20/2021 0749 Last data filed at 08/20/2021 5427 Gross per 24 hour  Intake 2255.42 ml  Output 100 ml  Net 2155.42 ml   Filed Weights   08/11/21 2312 08/14/21 0400 08/15/21 0455  Weight: 54.5 kg 55.1 kg 57 kg    Examination:  General: Appearance:    Ill appearing female who appears uncomfortable     Lungs:     respirations unlabored  Heart:    Tachycardic. regular  MS:   All extremities are intact. + edema- pitting   Neurologic:  Will awaken and answer questions     Data Reviewed: I have personally reviewed following labs and imaging studies:  Recent Labs  Lab 08/15/21 0055 08/16/21 0249 08/17/21 0505 08/18/21 0059 08/19/21 1019 08/20/21 0417  WBC 11.2* 10.3 13.4* 10.7* 20.2* 32.9*  HGB 11.6* 12.2 12.4 13.3 14.6 13.1  HCT 35.3* 38.0 39.1 40.6 45.0 42.2  PLT 391 413* 435* 414* 469* 468*  MCV 84.0 83.7 85.0 84.1 84.3 87.4  MCH 27.6 26.9 27.0 27.5 27.3 27.1  MCHC 32.9 32.1 31.7 32.8 32.4 31.0  RDW 16.3* 16.2* 16.3* 16.7* 17.7* 17.8*  LYMPHSABS 0.6* 0.7 0.8 1.1  --   1.8  MONOABS 0.6 0.4 0.5 0.5  --  0.7  EOSABS 0.2 0.3 0.1 0.0  --  0.0  BASOSABS 0.0 0.0 0.0 0.1  --  0.2*    Recent Labs  Lab 08/14/21 0138 08/14/21 0716 08/15/21 0055 08/15/21 0816 08/16/21 0249 08/16/21 0713 08/16/21 1015 08/17/21 0505 08/18/21 0059 08/19/21 0049 08/19/21 1019 08/19/21 2204 08/20/21 0417  NA 135  --  137  --  140  --   --  138 139  --  139  --  140  K 3.9  --  3.9  --  3.8  --   --  3.5 4.1  --  5.1  --  4.7  CL 107  --  109  --  112*  --   --  108 111  --  112*  --  115*  CO2 18*  --  19*  --  19*  --   --  20* 18*  --  15*  --  14*  GLUCOSE 114*  --  101*  --  109*  --   --  135* 133*  --  126*  --  131*  BUN 32*  --  26*  --  20  --   --  23 31*  --  46*  --  55*  CREATININE 0.91  --  0.77  --  0.69  --   --  0.85 1.07*  --  1.70*  --  2.16*  CALCIUM 8.6*  --  8.2*  --  8.2*  --   --  8.2* 8.2*  --  8.1*  --  7.3*  AST  --   --  24  --  26  --   --  31 36  --   --   --   --   ALT  --   --  11  --  11  --   --  12 10  --   --   --   --   ALKPHOS  --   --  41  --  41  --   --  38 39  --   --   --   --  BILITOT  --   --  0.6  --  0.5  --   --  0.4 0.3  --   --   --   --   ALBUMIN  --   --  2.0*  --  1.9*  --   --  1.8* 1.9*  --   --   --   --   MG  --   --  1.9  --  1.7  --   --  1.8 2.1  --   --   --   --   CRP 8.6*  --  12.0*  --  10.9*  --   --  9.8* 10.6*  --   --   --   --   DDIMER  --   --   --  3.22* 3.67*  --   --  4.10* 3.58* 3.33*  --   --   --   PROCALCITON  --    < > 0.21  --  0.24  --   --  0.55 1.14 1.80  --   --  3.41  LATICACIDVEN  --   --   --   --   --  1.6 2.0*  --   --   --   --  4.9* 5.0*  INR 1.8*  --   --   --   --   --   --   --   --   --   --   --   --   TSH  --   --   --   --   --   --   --   --  3.009  --   --   --   --    < > = values in this interval not displayed.         Radiology Studies: DG Chest Port 1 View  Result Date: 08/19/2021 CLINICAL DATA:  Pneumonia. EXAM: PORTABLE CHEST 1 VIEW COMPARISON:  Chest  radiograph dated 09/14/2021. FINDINGS: No focal consolidation, pleural effusion, pneumothorax. Stable cardiac silhouette. No acute osseous pathology. Cardiac monitor device. IMPRESSION: No active disease. Electronically Signed   By: Anner Crete M.D.   On: 08/19/2021 20:43   EEG adult  Result Date: 08/19/2021 Derek Jack, MD     08/19/2021  1:23 PM Routine EEG Report NALINA YEATMAN is a 70 y.o. female with a history of shaking spells who is undergoing an EEG to evaluate for seizures. Report: This EEG was acquired with electrodes placed according to the International 10-20 electrode system (including Fp1, Fp2, F3, F4, C3, C4, P3, P4, O1, O2, T3, T4, T5, T6, A1, A2, Fz, Cz, Pz). The following electrodes were missing or displaced: none. The occipital dominant rhythm was 9 Hz. This activity is reactive to stimulation. Drowsiness was manifested by background fragmentation; deeper stages of sleep were not identified. There was no focal slowing. There were no interictal epileptiform discharges. There were no electrographic seizures identified. Photic stimulation and hyperventilation were not performed. Impression: This EEG was obtained while awake and drowsy and is normal. Low amplitude high volume shaking observed on video throughout the recording had no EEG correlate and is not favored to be epileptic.   Clinical Correlation: Normal EEGs, however, do not rule out epilepsy. Su Monks, MD [facility] [phone] If 7pm- 7am, please page neurology on call as listed in [place].   ECHOCARDIOGRAM COMPLETE  Result Date: 08/19/2021    ECHOCARDIOGRAM REPORT  Patient Name:   WELTHA CATHY [facility] Date of Exam: 08/18/2021 Medical Rec #:  [id number]         Height:       66.0 in Accession #:    [id number]        Weight:       125.7 lb Date of Birth:  March 10, 1951         BSA:          1.641 m Patient Age:    66 years          BP:           88/76 mmHg Patient Gender: F                 HR:           121 bpm.  Exam Location:  Inpatient Procedure: 2D Echo, Color Doppler and Cardiac Doppler                               MODIFIED REPORT: This report was modified by Cherlynn Kaiser MD on 08/19/2021 due to revision.  Indications:     I50.30* Unspecified diastolic (congestive) heart failure  History:         Patient has prior history of Echocardiogram examinations, most                  recent 05/26/2020. Risk Factors:Hypertension.  Sonographer:     Tawnya Crook Referring Phys:  Arnell Asal Margaree Mackintosh [facility] Diagnosing Phys: Cherlynn Kaiser MD  Sonographer Comments: Technically difficult study due to poor echo windows. IMPRESSIONS  1. Left ventricular ejection fraction, by estimation, is 60 to 65%. The left ventricle has normal function. Left ventricular endocardial border not optimally defined to evaluate regional wall motion. There is mild left ventricular hypertrophy. Left ventricular diastolic parameters are indeterminate.  2. Right ventricular systolic function is mildly reduced. The right ventricular size is normal. There is normal pulmonary artery systolic pressure. The estimated right ventricular systolic pressure is 50.0 mmHg.  3. The mitral valve is grossly normal. Trivial mitral valve regurgitation. No evidence of mitral stenosis.  4. The aortic valve is grossly normal. There is mild thickening of the aortic valve. Aortic valve regurgitation is not visualized. No aortic stenosis is present.  5. The inferior vena cava is normal in size with greater than 50% respiratory variability, suggesting right atrial pressure of 3 mmHg. Conclusion(s)/Recommendation(s): Poor quality exam with limited diagnostic yield. Recommend repeat limited echo when patient's heart rate has normalized. FINDINGS  Left Ventricle: Left ventricular ejection fraction, by estimation, is 60 to 65%. The left ventricle has normal function. Left ventricular endocardial border not optimally defined to evaluate regional wall motion. The left ventricular internal  cavity size was normal in size. There is mild left ventricular hypertrophy. Left ventricular diastolic parameters are indeterminate. Right Ventricle: The right ventricular size is normal. No increase in right ventricular wall thickness. Right ventricular systolic function is mildly reduced. There is normal pulmonary artery systolic pressure. The tricuspid regurgitant velocity is 2.71 m/s, and with an assumed right atrial pressure of 3 mmHg, the estimated right ventricular systolic pressure is 37.0 mmHg. Left Atrium: Left atrial size was normal in size. Right Atrium: Right atrial size was not well visualized. Pericardium: There is no evidence of pericardial effusion. Mitral Valve: The mitral valve is grossly normal. Trivial mitral valve regurgitation. No evidence of mitral valve stenosis. Tricuspid Valve: The tricuspid valve is  normal in structure. Tricuspid valve regurgitation is mild. Aortic Valve: The aortic valve is grossly normal. There is mild thickening of the aortic valve. Aortic valve regurgitation is not visualized. No aortic stenosis is present. Aortic valve mean gradient measures 3.0 mmHg. Aortic valve peak gradient measures  5.3 mmHg. Aortic valve area, by VTI measures 4.63 cm. Pulmonic Valve: The pulmonic valve was not well visualized. Pulmonic valve regurgitation is trivial. Aorta: The ascending aorta was not well visualized and the aortic root is normal in size and structure. Venous: The inferior vena cava is normal in size with greater than 50% respiratory variability, suggesting right atrial pressure of 3 mmHg. IAS/Shunts: The interatrial septum was not well visualized.  LEFT VENTRICLE PLAX 2D LVIDd:         2.10 cm  Diastology LVIDs:         1.50 cm  LV e' medial:  8.81 cm/s LV PW:         1.10 cm  LV e' lateral: 5.33 cm/s LV IVS:        1.10 cm LVOT diam:     2.40 cm LV SV:         59 LV SV Index:   36 LVOT Area:     4.52 cm  RIGHT VENTRICLE             IVC RV S prime:     11.70 cm/s  IVC diam:  1.60 cm TAPSE (M-mode): 1.7 cm LEFT ATRIUM             Index LA diam:        2.10 cm 1.28 cm/m LA Vol (A2C):   16.6 ml 10.11 ml/m LA Vol (A4C):   9.6 ml  5.86 ml/m LA Biplane Vol: 12.4 ml 7.55 ml/m  AORTIC VALVE                   PULMONIC VALVE AV Area (Vmax):    3.65 cm    PV Vmax:       0.68 m/s AV Area (Vmean):   2.93 cm    PV Peak grad:  1.8 mmHg AV Area (VTI):     4.63 cm AV Vmax:           115.00 cm/s AV Vmean:          79.800 cm/s AV VTI:            0.128 m AV Peak Grad:      5.3 mmHg AV Mean Grad:      3.0 mmHg LVOT Vmax:         92.70 cm/s LVOT Vmean:        51.700 cm/s LVOT VTI:          0.131 m LVOT/AV VTI ratio: 1.02  AORTA Ao Root diam: 3.10 cm TRICUSPID VALVE TR Peak grad:   29.4 mmHg TR Vmax:        271.00 cm/s  SHUNTS Systemic VTI:  0.13 m Systemic Diam: 2.40 cm Cherlynn Kaiser MD Electronically signed by Cherlynn Kaiser MD Signature Date/Time: 08/19/2021/12:00:36 AM    Final (Updated)      Scheduled Meds:  aspirin EC  81 mg Oral Daily   atorvastatin  40 mg Oral QHS   buPROPion  150 mg Oral Daily   clopidogrel  75 mg Oral Daily   docusate sodium  100 mg Oral BID   enoxaparin (LOVENOX) injection  30 mg Subcutaneous Q24H   metoprolol succinate  25 mg Oral  Daily   polyethylene glycol  17 g Oral BID   sodium chloride flush  3 mL Intravenous Q12H   cyanocobalamin  1,000 mcg Oral Daily   Continuous Infusions:  sodium chloride 125 mL/hr at 08/20/21 0043   albumin human     cefTRIAXone (ROCEPHIN)  IV 1 g (08/19/21 1253)      LOS: 10 days    Time spent: 55 minutes  Signature  Geradine Girt DO on 08/20/2021 at 7:49 AM   -  To page go to [email or web address]

## 2021-08-20 NOTE — Progress Notes (Signed)
HOSPITAL MEDICINE OVERNIGHT EVENT NOTE    Notified by nursing that repeat lactic acid level this morning has come back at 5.0 despite significant attempts to volume resuscitate the patient.  I went to go evaluate the patient at the bedside at approximately 6:50 AM.  Patient is lethargic but arousable and able to provide me with appropriate one-word answers to most questions.  It is oriented x2 (person, place).  On exam, patient is exhibiting bibasilar rales on lung exam, rapid yet regular rhythm without obvious murmur on heart exam, and what seems to be a diffusely tender but soft abdomen.  It is of particular concern that the patient is developing severe pitting edema of all 4 extremities that seems to be a rapid progression since yesterday.  Considering patient's persisting SIRS and worsening lactic acidosis I am still concerned about a progressive unidentified infectious process in this patient.  Attempts to volume resuscitate the patient are hampered by low oncotic pressure from severe hypoalbuminemia resulting in substantial third spacing.  Patient may benefit from initiation of colloid infusion as well as broadening of antibiotic therapy and possible escalation of care to the intensive care unit.  Diffuse abdominal tenderness on exam may warrant repeat imaging of the abdomen although this has been negative in the past.    I have thoroughly discussed this case with Dr. Eliseo Squires my partner who has asked that 1 amp of sodium bicarb to be administered while she contacts CCM to discuss further steps and avenues of care.  Her assistance is very much appreciated.  Michaela Emerald  MD [facility]

## 2021-08-20 NOTE — Progress Notes (Signed)
[place] Progress Note Patient Name: Michaela Rios DOB: 07/01/1951 MRN: [id number]   Date of Service  08/20/2021  HPI/Events of Note  Nursing concerned about tremor of R shoulder. Patient is lethargic, however, she is able to intermittently wake up and follow commands. Hospital course complicated by multiple acute strokes. Clinical picture suggests focal seizure (at worst) vs tremor.   eICU Interventions  Plan: Keppra 1 gm IV now and the 500 mg IV Q 12 hours.  Consider Neurology consult and possible continuous EEG monitoring in the AM.     Intervention Category Major Interventions: Seizures - evaluation and management  Reisha Wos Cornelia Copa 08/20/2021, 10:13 PM

## 2021-08-20 NOTE — Procedures (Signed)
Cortrak  Person Inserting Tube:  Esaw Dace, RD Tube Type:  Cortrak - 43 inches Tube Size:  10 Tube Location:  Right nare Initial Placement:  Stomach Secured by: Bridle Technique Used to Measure Tube Placement:  Marking at nare/corner of mouth Cortrak Secured At:  60 cm   Cortrak tube repositioned as previous Cortrak looped.    X-ray is required, abdominal x-ray has been ordered by the Cortrak team. Please confirm tube placement before using the Cortrak tube.   If the tube becomes dislodged please keep the tube and contact the Cortrak team at [email or web address] (password [email or web address]) for replacement.  If after hours and replacement cannot be delayed, place a NG tube and confirm placement with an abdominal x-ray.    Kerman Passey MS, RDN, LDN, CNSC Registered Dietitian III Clinical Nutrition RD Pager and On-Call Pager Number Located in [place] ,

## 2021-08-21 ENCOUNTER — Inpatient Hospital Stay (HOSPITAL_COMMUNITY): Payer: Medicare HMO

## 2021-08-21 DIAGNOSIS — R400 Somnolence: Secondary | ICD-10-CM

## 2021-08-21 DIAGNOSIS — E43 Unspecified severe protein-calorie malnutrition: Secondary | ICD-10-CM | POA: Diagnosis not present

## 2021-08-21 DIAGNOSIS — R6510 Systemic inflammatory response syndrome (SIRS) of non-infectious origin without acute organ dysfunction: Secondary | ICD-10-CM | POA: Diagnosis not present

## 2021-08-21 DIAGNOSIS — I63313 Cerebral infarction due to thrombosis of bilateral middle cerebral arteries: Secondary | ICD-10-CM | POA: Diagnosis not present

## 2021-08-21 LAB — DRVVT CONFIRM: dRVVT Confirm: 2.3 ratio — ABNORMAL HIGH (ref 0.8–1.2)

## 2021-08-21 LAB — URINALYSIS, COMPLETE (UACMP) WITH MICROSCOPIC
Bacteria, UA: NONE SEEN
Bilirubin Urine: NEGATIVE
Glucose, UA: NEGATIVE mg/dL
Ketones, ur: NEGATIVE mg/dL
Nitrite: NEGATIVE
Protein, ur: 30 mg/dL — AB
Specific Gravity, Urine: 1.029 (ref 1.005–1.030)
pH: 5 (ref 5.0–8.0)

## 2021-08-21 LAB — ANTIPHOSPHOLIPID SYNDROME EVAL, BLD
Anticardiolipin IgA: <9 APL U/mL (ref 0–11)
Anticardiolipin IgG: <9 GPL U/mL (ref 0–14)
Anticardiolipin IgM: 15 MPL U/mL — ABNORMAL HIGH (ref 0–12)
DRVVT: 116.6 s — ABNORMAL HIGH (ref 0.0–47.0)
PTT Lupus Anticoagulant: 71.1 s — ABNORMAL HIGH (ref 0.0–51.9)
Phosphatydalserine, IgA: 2 APS Units (ref 0–19)
Phosphatydalserine, IgG: 40 Units — ABNORMAL HIGH (ref 0–30)
Phosphatydalserine, IgM: >151 Units — ABNORMAL HIGH (ref 0–30)

## 2021-08-21 LAB — GLUCOSE, CAPILLARY
Glucose-Capillary: 185 mg/dL — ABNORMAL HIGH (ref 70–99)
Glucose-Capillary: 149 mg/dL — ABNORMAL HIGH (ref 70–99)
Glucose-Capillary: 117 mg/dL — ABNORMAL HIGH (ref 70–99)

## 2021-08-21 LAB — POCT I-STAT 7, (LYTES, BLD GAS, ICA,H+H)
Acid-base deficit: 5 mmol/L — ABNORMAL HIGH (ref 0.0–2.0)
Bicarbonate: 18.5 mmol/L — ABNORMAL LOW (ref 20.0–28.0)
Calcium, Ion: 1.05 mmol/L — ABNORMAL LOW (ref 1.15–1.40)
HCT: 41 % (ref 36.0–46.0)
Hemoglobin: 13.9 g/dL (ref 12.0–15.0)
O2 Saturation: 100 %
Patient temperature: 99
Potassium: 3.6 mmol/L (ref 3.5–5.1)
Sodium: 144 mmol/L (ref 135–145)
TCO2: 19 mmol/L — ABNORMAL LOW (ref 22–32)
pCO2 arterial: 28.7 mmHg — ABNORMAL LOW (ref 32.0–48.0)
pH, Arterial: 7.418 (ref 7.350–7.450)
pO2, Arterial: 167 mmHg — ABNORMAL HIGH (ref 83.0–108.0)

## 2021-08-21 LAB — COMPREHENSIVE METABOLIC PANEL
ALT: 202 U/L — ABNORMAL HIGH (ref 0–44)
AST: 712 U/L — ABNORMAL HIGH (ref 15–41)
Albumin: 1.8 g/dL — ABNORMAL LOW (ref 3.5–5.0)
Alkaline Phosphatase: 113 U/L (ref 38–126)
Anion gap: 9 (ref 5–15)
BUN: 68 mg/dL — ABNORMAL HIGH (ref 8–23)
CO2: 21 mmol/L — ABNORMAL LOW (ref 22–32)
Calcium: 7 mg/dL — ABNORMAL LOW (ref 8.9–10.3)
Chloride: 112 mmol/L — ABNORMAL HIGH (ref 98–111)
Creatinine, Ser: 2.24 mg/dL — ABNORMAL HIGH (ref 0.44–1.00)
GFR, Estimated: 23 mL/min — ABNORMAL LOW (ref >60)
Glucose, Bld: 209 mg/dL — ABNORMAL HIGH (ref 70–99)
Potassium: 3.5 mmol/L (ref 3.5–5.1)
Sodium: 142 mmol/L (ref 135–145)
Total Bilirubin: 0.6 mg/dL (ref 0.3–1.2)
Total Protein: 3.6 g/dL — ABNORMAL LOW (ref 6.5–8.1)

## 2021-08-21 LAB — C-REACTIVE PROTEIN: CRP: 4.5 mg/dL — ABNORMAL HIGH (ref <1.0)

## 2021-08-21 LAB — DRVVT MIX: dRVVT Mix: 84.8 s — ABNORMAL HIGH (ref 0.0–40.4)

## 2021-08-21 LAB — CBC
HCT: 35.7 % — ABNORMAL LOW (ref 36.0–46.0)
Hemoglobin: 12 g/dL (ref 12.0–15.0)
MCH: 27.9 pg (ref 26.0–34.0)
MCHC: 33.6 g/dL (ref 30.0–36.0)
MCV: 83 fL (ref 80.0–100.0)
Platelets: 350 10*3/uL (ref 150–400)
RBC: 4.3 MIL/uL (ref 3.87–5.11)
RDW: 17.2 % — ABNORMAL HIGH (ref 11.5–15.5)
WBC: 39.6 10*3/uL — ABNORMAL HIGH (ref 4.0–10.5)
nRBC: 18.9 % — ABNORMAL HIGH (ref 0.0–0.2)

## 2021-08-21 LAB — SEDIMENTATION RATE: Sed Rate: 1 mm/hr (ref 0–22)

## 2021-08-21 LAB — LACTIC ACID, PLASMA: Lactic Acid, Venous: 2.9 mmol/L (ref 0.5–1.9)

## 2021-08-21 LAB — HEXAGONAL PHASE PHOSPHOLIPID: Hexagonal Phase Phospholipid: 37 s — ABNORMAL HIGH (ref 0–11)

## 2021-08-21 LAB — PTT-LA MIX: PTT-LA Mix: 69.8 s — ABNORMAL HIGH (ref 0.0–48.9)

## 2021-08-21 LAB — PHOSPHORUS: Phosphorus: 3.5 mg/dL (ref 2.5–4.6)

## 2021-08-21 LAB — MAGNESIUM: Magnesium: 2 mg/dL (ref 1.7–2.4)

## 2021-08-21 LAB — PROCALCITONIN: Procalcitonin: 4.96 ng/mL

## 2021-08-21 MED ORDER — SODIUM CHLORIDE 0.9 % IV SOLN: 250.0000 mL | INTRAVENOUS | Status: DC

## 2021-08-21 MED ORDER — MORPHINE BOLUS VIA INFUSION
5.0000 mg | INTRAVENOUS | Status: DC | PRN
  Administered 2021-08-21 (×2): 5 mg via INTRAVENOUS
  Filled 2021-08-21: qty 5

## 2021-08-21 MED ORDER — METOPROLOL TARTRATE 25 MG PO TABS: 25.0000 mg | ORAL_TABLET | Freq: Two times a day (BID) | ORAL | Status: DC

## 2021-08-21 MED ORDER — STERILE WATER FOR INJECTION IV SOLN
INTRAVENOUS | Status: DC
  Filled 2021-08-21: qty 1000

## 2021-08-21 MED ORDER — CALCIUM GLUCONATE-NACL 2-0.675 GM/100ML-% IV SOLN
2.0000 g | Freq: Once | INTRAVENOUS | Status: DC
  Filled 2021-08-21: qty 100

## 2021-08-21 MED ORDER — POTASSIUM CHLORIDE 20 MEQ PO PACK: 60.0000 meq | PACK | Freq: Once | ORAL | Status: DC

## 2021-08-21 MED ORDER — WHITE PETROLATUM EX OINT
TOPICAL_OINTMENT | CUTANEOUS | Status: AC
  Filled 2021-08-21: qty 28.35

## 2021-08-21 MED ORDER — HEPARIN (PORCINE) 25000 UT/250ML-% IV SOLN
1000.0000 [IU]/h | INTRAVENOUS | Status: DC
  Filled 2021-08-21: qty 250

## 2021-08-21 MED ORDER — ALBUMIN HUMAN 25 % IV SOLN
12.5000 g | Freq: Once | INTRAVENOUS | Status: AC
  Administered 2021-08-21: 12.5 g via INTRAVENOUS
  Filled 2021-08-21: qty 50

## 2021-08-21 MED ORDER — MIDAZOLAM HCL 2 MG/2ML IJ SOLN: 2.0000 mg | INTRAMUSCULAR | Status: DC | PRN

## 2021-08-21 MED ORDER — MORPHINE 100MG IN NS 100ML (1MG/ML) PREMIX INFUSION
0.0000 mg/h | INTRAVENOUS | Status: DC
  Administered 2021-08-21: 5 mg/h via INTRAVENOUS
  Filled 2021-08-21: qty 100

## 2021-08-21 MED ORDER — PANTOPRAZOLE 2 MG/ML SUSPENSION: 40.0000 mg | Freq: Every day | ORAL | Status: DC | PRN

## 2021-08-21 MED ORDER — GLYCOPYRROLATE 0.2 MG/ML IJ SOLN: 0.2000 mg | INTRAMUSCULAR | Status: DC | PRN

## 2021-08-21 MED ORDER — DEXTROSE 5 % IV SOLN: INTRAVENOUS | Status: DC

## 2021-08-21 MED ORDER — GLYCOPYRROLATE 1 MG PO TABS: 1.0000 mg | ORAL_TABLET | ORAL | Status: DC | PRN

## 2021-08-21 MED ORDER — PHENYLEPHRINE HCL-NACL 20-0.9 MG/250ML-% IV SOLN
25.0000 ug/min | INTRAVENOUS | Status: DC
  Administered 2021-08-21: 25 ug/min via INTRAVENOUS
  Filled 2021-08-21: qty 250

## 2021-08-21 MED ORDER — HEPARIN BOLUS VIA INFUSION
2000.0000 [IU] | Freq: Once | INTRAVENOUS | Status: DC
  Filled 2021-08-21: qty 2000

## 2021-08-21 MED ORDER — HEPARIN SODIUM (PORCINE) 5000 UNIT/ML IJ SOLN: 5000.0000 [IU] | Freq: Three times a day (TID) | INTRAMUSCULAR | Status: DC

## 2021-08-21 MED ORDER — POLYVINYL ALCOHOL 1.4 % OP SOLN
1.0000 [drp] | Freq: Four times a day (QID) | OPHTHALMIC | Status: DC | PRN
  Filled 2021-08-21: qty 15

## 2021-08-21 MED ORDER — MORPHINE SULFATE (PF) 2 MG/ML IV SOLN
2.0000 mg | INTRAVENOUS | Status: DC | PRN
  Administered 2021-08-21: 2 mg via INTRAVENOUS

## 2021-08-21 MED ORDER — VENLAFAXINE HCL 50 MG PO TABS
50.0000 mg | ORAL_TABLET | Freq: Two times a day (BID) | ORAL | Status: DC
  Administered 2021-08-21: 50 mg
  Filled 2021-08-21 (×2): qty 1

## 2021-08-21 MED ORDER — SODIUM CHLORIDE 0.9 % IV SOLN
1.0000 g | Freq: Two times a day (BID) | INTRAVENOUS | Status: DC
  Filled 2021-08-21: qty 1

## 2021-08-21 MED ORDER — DIPHENHYDRAMINE HCL 50 MG/ML IJ SOLN: 25.0000 mg | INTRAMUSCULAR | Status: DC | PRN

## 2021-08-21 MED ORDER — SODIUM BICARBONATE 8.4 % IV SOLN
INTRAVENOUS | Status: AC
  Administered 2021-08-21: 50 meq
  Filled 2021-08-21: qty 50

## 2021-08-21 MED ORDER — ACETAMINOPHEN 325 MG PO TABS: 650.0000 mg | ORAL_TABLET | Freq: Four times a day (QID) | ORAL | Status: DC | PRN

## 2021-08-21 MED ORDER — ACETAMINOPHEN 650 MG RE SUPP: 650.0000 mg | Freq: Four times a day (QID) | RECTAL | Status: DC | PRN

## 2021-08-22 LAB — URINE CULTURE: Culture: NO GROWTH

## 2021-08-24 ENCOUNTER — Encounter: Payer: Medicare HMO | Admitting: Physical Medicine & Rehabilitation

## 2021-08-24 LAB — CULTURE, BLOOD (ROUTINE X 2)
Culture: NO GROWTH
Culture: NO GROWTH
Special Requests: ADEQUATE

## 2021-08-24 LAB — C3 COMPLEMENT: C3 Complement: 17 mg/dL — ABNORMAL LOW (ref 82–167)

## 2021-08-24 LAB — PATHOLOGIST SMEAR REVIEW

## 2021-08-24 LAB — C4 COMPLEMENT: Complement C4, Body Fluid: <2 mg/dL — ABNORMAL LOW (ref 12–38)

## 2021-08-25 ENCOUNTER — Ambulatory Visit: Payer: Medicare HMO

## 2021-08-25 ENCOUNTER — Encounter: Payer: Medicare HMO | Admitting: Occupational Therapy

## 2021-08-27 ENCOUNTER — Ambulatory Visit: Payer: Medicare HMO | Admitting: Physical Therapy

## 2021-08-27 ENCOUNTER — Encounter: Payer: Medicare HMO | Admitting: Occupational Therapy

## 2021-08-28 NOTE — Progress Notes (Signed)
STROKE TEAM PROGRESS NOTE    INTERVAL HISTORY Stroke team was asked to come back and see this patient because of worsening mental status in the last few days.  Patient has had worsening renal function with creatinine elevation to 2.24 from 1.72 days ago as well as elevated liver enzymes AST 712 and ALT 202.  She is also been noticed to be tremulous.  EEG done on 08/19/2021 was normal and tremulousness noted was felt not to be epileptic.  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  Hypercoagulable panel labs have suggested positive lupus anticoagulant.  ESR today is 1 mm and C-reactive protein is elevated at 4.5 lactic acid is still elevated at 2.9 but has come down from 5 days before.  She continues to have fever No family at the bedside.  Patient's RN was present during this visit   Vitals:   2021-09-11 1215 09-11-21 1230 2021/09/11 1235 11-Sep-2021 1240  BP:      Pulse:   (!) 164 (!) 160  Temp:      Resp: (!) 32 (!) 31 (!) 35 (!) 33  Height:      Weight:      SpO2:   99% (!) 86%  TempSrc:      BMI (Calculated):           IMAGING  CT HEAD WO CONTRAST (5MM)  Result Date: 08/15/2021 CLINICAL DATA:  Neuro deficit, stroke suspected right-sided weakness EXAM: CT HEAD WITHOUT CONTRAST TECHNIQUE: Contiguous axial images were obtained from the base of the skull through the vertex without intravenous contrast. COMPARISON:  08/14/2021 FINDINGS: Brain: No significant change from prior exam. Redemonstrated right basal ganglia and left thalamic infarcts. Periventricular white matter changes, likely the sequela of chronic small vessel ischemic disease. No acute cortical infarct, hemorrhage, mass, mass effect, or midline shift. No hydrocephalus. Vascular: No hyperdense vessel Skull: Normal. Negative for fracture or focal lesion. Sinuses/Orbits: No acute finding. Other: The mastoids are well  aerated. IMPRESSION: No acute intracranial process. No significant change from the prior exam. Electronically Signed   By: Merilyn Baba M.D.   On: 08/15/2021 03:09   CT CHEST WO CONTRAST  Result Date: 08/17/2021 CLINICAL DATA:  Left lower lobe opacity on recent chest radiograph. History of strokes. Elevated white blood cell count. Pneumonia. EXAM: CT CHEST WITHOUT CONTRAST TECHNIQUE: Multidetector CT imaging of the chest was performed following the standard protocol without IV contrast. COMPARISON:  Plain film 08/15/2021 and CTA chest 05/24/2021 FINDINGS: Cardiovascular: Aortic atherosclerosis. Tortuous thoracic aorta. Normal heart size, without pericardial effusion. Mediastinum/Nodes: No mediastinal or definite hilar adenopathy, given limitations of unenhanced CT. Surgical changes at the gastroesophageal junction. Lungs/Pleura: New trace pleural fluid since the prior CT. Patent airways. Motion degradation in the lung bases. Left lower lobe minimal volume loss and compressive dependent atelectasis. Subpleural right middle lobe 4 mm nodule on 98/6 is similar to on the 12/03/2004 exam, most likely a subpleural lymph node. Upper Abdomen: Normal imaged portions of the liver, spleen, stomach, pancreas, gallbladder. Musculoskeletal: No acute osseous abnormality. IMPRESSION: 1. Mildly motion degraded exam. 2. Trace left pleural fluid with adjacent volume loss and dependent atelectasis. No evidence of pneumonia. 3.  Aortic Atherosclerosis (ICD10-I70.0). Electronically Signed   By: Abigail Miyamoto M.D.   On: 08/17/2021 13:52   CT Angio Chest Pulmonary Embolism (PE) W or WO Contrast  Result Date: 08/17/2021 CLINICAL  DATA:  PE suspected, high probability EXAM: CT ANGIOGRAPHY CHEST WITH CONTRAST TECHNIQUE: Multidetector CT imaging of the chest was performed using the standard protocol during bolus administration of intravenous contrast. Multiplanar CT image reconstructions and MIPs were obtained to evaluate the vascular  anatomy. CONTRAST:  120mL OMNIPAQUE IOHEXOL 350 MG/ML SOLN COMPARISON:  CTA chest 05/24/2021, correlation is also made with CT chest without contrast 08/17/2021 FINDINGS: Cardiovascular: Satisfactory opacification of the pulmonary arteries to the segmental level. No evidence of pulmonary embolism. Normal heart size. No pericardial effusion. Aortic atherosclerosis. Mediastinum/Nodes: No enlarged mediastinal, hilar, or axillary lymph nodes. Thyroid gland, trachea, and esophagus demonstrate no significant findings. Lungs/Pleura: Trace left-greater-than-right pleural fluid, with minimal associated atelectasis. Unchanged 4 mm nodule in the right middle lobe (series 15, image 262), which has been stable over multiple prior exams and may represent a subpleural lymph node. Upper Abdomen: No acute abnormality. High-density material within the stomach was present on the noncontrast exam and likely represents ingested material. Musculoskeletal: No acute osseous abnormality. Review of the MIP images confirms the above findings. IMPRESSION: 1. Negative for pulmonary embolism. 2. Redemonstrated trace left-greater-than-right pleural fluid with associated atelectasis. 3.  Aortic Atherosclerosis (ICD10-I70.0). Electronically Signed   By: Merilyn Baba M.D.   On: 08/17/2021 19:41   MR Brain W Wo Contrast  Result Date: 08/02/2021 CLINICAL DATA:  Stroke, follow-up.  Previous abnormal MRI. EXAM: MRI HEAD WITHOUT AND WITH CONTRAST TECHNIQUE: Multiplanar, multiecho pulse sequences of the brain and surrounding structures were obtained without and with intravenous contrast. CONTRAST:  105mL MULTIHANCE GADOBENATE DIMEGLUMINE 529 MG/ML IV SOLN COMPARISON:  MR head without and with contrast 06/02/2021 05/26/21 FINDINGS: Brain: Continued expected evolution of deep gray matter infarcts noted. No residual enhancement is present. Punctate areas of diffusion signal surround the infarct involving the right internal capsule and caudate. Additional  punctate areas of diffusion hyperintensity noted within the left caudate head and posterior left putamen. Areas of confluent T2 signal change are noted in the basal ganglia bilaterally and left greater than right thalami without significant interval change. No acute hemorrhage or mass lesion is present. The ventricles are of normal size. No significant extraaxial fluid collection is present. Postcontrast images are otherwise within normal limits. Vascular: Flow is present in the major intracranial arteries. Skull and upper cervical spine: Mild degenerative changes are noted at C2-3. The craniocervical junction is normal. Midline structures are within normal limits. Marrow signal is unremarkable. Sinuses/Orbits: The paranasal sinuses and mastoid air cells are clear. The globes and orbits are within normal limits. IMPRESSION: 1. Continued expected evolution of deep gray matter infarcts bilaterally. 2. Additional punctate areas of diffusion hyperintensity are present within the left caudate head and posterior left putamen. These likely represent areas of acute/subacute ischemia. 3. Stable diffuse T2 signal change in the basal ganglia bilaterally and left greater than right thalami. This likely reflects the sequela of chronic microvascular ischemia. Electronically Signed   By: San Morelle M.D.   On: 08/02/2021 08:29   CT ABDOMEN PELVIS W CONTRAST  Result Date: 08/15/2021 CLINICAL DATA:  Fever, abdominal pain EXAM: CT ABDOMEN AND PELVIS WITH CONTRAST TECHNIQUE: Multidetector CT imaging of the abdomen and pelvis was performed using the standard protocol following bolus administration of intravenous contrast. CONTRAST:  113mL OMNIPAQUE IOHEXOL 300 MG/ML  SOLN COMPARISON:  06/24/2021 FINDINGS: Lower chest: No acute abnormality. Unchanged elevation of the left hemidiaphragm. Hepatobiliary: No solid liver abnormality is seen. No gallstones, gallbladder wall thickening, or biliary dilatation. Pancreas:  Unremarkable. No pancreatic  ductal dilatation or surrounding inflammatory changes. Spleen: Normal in size without significant abnormality. Adrenals/Urinary Tract: Adrenal glands are unremarkable. Kidneys are normal, without renal calculi, solid lesion, or hydronephrosis. Heterogeneous material within the dependent left urinary bladder, dependently hyperdense (series 3, image 73). Stomach/Bowel: Stomach is within normal limits. Appendix appears normal. No evidence of bowel wall thickening, distention, or inflammatory changes. Large burden of stool throughout the distal colon. Vascular/Lymphatic: Aortic atherosclerosis. No enlarged abdominal or pelvic lymph nodes. Reproductive: Status post hysterectomy. Other: No abdominal wall hernia. Anasarca. No abdominopelvic ascites. Musculoskeletal: No acute or significant osseous findings. IMPRESSION: 1. Heterogeneous material within the dependent left urinary bladder, dependently hyperdense. This may reflect some combination of excreted contrast with mixing artifact, clot, or small bladder stones/calcific debris. Endoluminal bladder mass is difficult to strictly exclude. Recommend delayed phase CT urogram to further evaluate. 2. Large burden of stool throughout the distal colon. 3. Anasarca. Aortic Atherosclerosis (ICD10-I70.0). Electronically Signed   By: Eddie Candle M.D.   On: 08/15/2021 12:50   DG Chest Port 1 View  Result Date: 08/19/2021 CLINICAL DATA:  Pneumonia. EXAM: PORTABLE CHEST 1 VIEW COMPARISON:  Chest radiograph dated 09/14/2021. FINDINGS: No focal consolidation, pleural effusion, pneumothorax. Stable cardiac silhouette. No acute osseous pathology. Cardiac monitor device. IMPRESSION: No active disease. Electronically Signed   By: Anner Crete M.D.   On: 08/19/2021 20:43   DG Chest Port 1 View  Result Date: 08/15/2021 CLINICAL DATA:  Retrocardiac density. EXAM: PORTABLE CHEST 1 VIEW COMPARISON:  08/14/2021 FINDINGS: Right lung clear. Persistent  retrocardiac streaky opacity, likely atelectasis or scar. The cardiopericardial silhouette is within normal limits for size. The visualized bony structures of the thorax show no acute abnormality. IMPRESSION: Persistent retrocardiac streaky opacity, likely atelectasis or scar. This appears to be new since CT scan of 05/25/2021. Follow-up chest x-ray could be used to ensure resolution. Electronically Signed   By: Misty Stanley M.D.   On: 08/15/2021 08:07   DG Chest Port 1 View  Result Date: 08/14/2021 CLINICAL DATA:  Shortness of breath EXAM: PORTABLE CHEST 1 VIEW COMPARISON:  08/24/2021 FINDINGS: Hazy and streaky retrocardiac opacity. Aortic tortuosity. Normal heart size. Implantable loop recorder. The lungs are clear of edema. No pleural fluid or pneumothorax. IMPRESSION: Retrocardiac opacity with shape favoring atelectasis. Electronically Signed   By: Jorje Guild M.D.   On: 08/14/2021 09:14   DG Chest Port 1 View  Result Date: 08/12/2021 CLINICAL DATA:  Concern for sepsis EXAM: PORTABLE CHEST 1 VIEW COMPARISON:  Chest x-ray dated July 18th 2022 FINDINGS: Cardiac and mediastinal contours are unchanged when accounting for differences in lung volumes. Left basilar atelectasis. Lungs otherwise clear. No pleural effusion or pneumothorax. Loop recorder device noted. IMPRESSION: No active disease. Electronically Signed   By: Yetta Glassman M.D.   On: 08/14/2021 11:06   DG Abd Portable 1V  Result Date: 08/20/2021 CLINICAL DATA:  Encounter for feeding tube placement. EXAM: PORTABLE ABDOMEN - 1 VIEW COMPARISON:  08/20/2021 at 1157 hours FINDINGS: There is a feeding tube in the abdomen. The tip is in the gastric antrum region. Cardiac loop recorder is identified in the lower chest. Nonobstructive bowel gas pattern. IMPRESSION: Feeding tube tip is in the gastric antrum region. Electronically Signed   By: Markus Daft M.D.   On: 08/20/2021 15:38   DG Abd Portable 1V  Result Date: 08/20/2021 CLINICAL DATA:   Encounter for feeding tube placement Z46.59 (ICD-10-CM) EXAM: PORTABLE ABDOMEN - 1 VIEW COMPARISON:  Chest radiograph 08/19/2021. FINDINGS: Feeding tube  courses below the diaphragm and is looped back on itself in the stomach. Paucity of bowel gas without evidence of dilated bowel loops. Visualized lung bases are clear. Loop recorder projects over the left lower chest. Facet arthropathy in the lower lumbar spine. Broad rotatory levocurvature of the thoracic and lumbar spine. IMPRESSION: Feeding tube courses below the diaphragm and is looped back on itself in the stomach. Electronically Signed   By: Margaretha Sheffield M.D.   On: 08/20/2021 12:47   EEG adult  Result Date: 08/19/2021 Derek Jack, MD     08/19/2021  1:23 PM Routine EEG Report ZAMARIYAH FURUKAWA is a 70 y.o. female with a history of shaking spells who is undergoing an EEG to evaluate for seizures. Report: This EEG was acquired with electrodes placed according to the International 10-20 electrode system (including Fp1, Fp2, F3, F4, C3, C4, P3, P4, O1, O2, T3, T4, T5, T6, A1, A2, Fz, Cz, Pz). The following electrodes were missing or displaced: none. The occipital dominant rhythm was 9 Hz. This activity is reactive to stimulation. Drowsiness was manifested by background fragmentation; deeper stages of sleep were not identified. There was no focal slowing. There were no interictal epileptiform discharges. There were no electrographic seizures identified. Photic stimulation and hyperventilation were not performed. Impression: This EEG was obtained while awake and drowsy and is normal. Low amplitude high volume shaking observed on video throughout the recording had no EEG correlate and is not favored to be epileptic.   Clinical Correlation: Normal EEGs, however, do not rule out epilepsy. Su Monks, MD [facility] [phone] If 7pm- 7am, please page neurology on call as listed in [place].   VAS [place] TRANSCRANIAL DOPPLER W BUBBLES  Result  Date: 08/16/2021  Transcranial Doppler with Bubble Patient Name:  MARLEENA SHUBERT [facility]  Date of Exam:   08/13/2021 Medical Rec #: [id number]          Accession #:    [id number] Date of Birth: 03-18-1951          Patient Gender: F Patient Age:   63 years Exam Location:  [facility] Procedure:      VAS [place] TRANSCRANIAL DOPPLER W BUBBLES Referring Phys: Lelan Pons FRANCOIS --------------------------------------------------------------------------------  Indications: Stroke. Comparison Study: No prior studies. Performing Technologist: Oliver Hum RVT  Examination Guidelines: A complete evaluation includes B-mode imaging, spectral Doppler, color Doppler, and power Doppler as needed of all accessible portions of each vessel. Bilateral testing is considered an integral part of a complete examination. Limited examinations for reoccurring indications may be performed as noted.  Summary:  A vascular evaluation was performed. The right middle cerebral artery was studied. An IV was inserted into the patient's right Cephalic vein.. Verbal informed consent was obtained.  A near curtain effect was noted with and without valsalva. Moderate to large, Spencer grade 5 PFO. Positive TCD Bubble study indicative of a medium to large size right to left shunt *See table(s) above for TCD measurements and observations.  Diagnosing physician: Antony Contras MD Electronically signed by Antony Contras MD on 08/16/2021 at 9:16:53 AM.    Final    ECHOCARDIOGRAM COMPLETE  Result Date: 08/19/2021    ECHOCARDIOGRAM REPORT   Patient Name:   JAMECIA LERMAN [facility] Date of Exam: 08/18/2021 Medical Rec #:  [id number]         Height:       66.0 in Accession #:    [id number]        Weight:  125.7 lb Date of Birth:  [birth year]         BSA:          1.641 m Patient Age:    70 years          BP:           88/76 mmHg Patient Gender: F                 HR:           121 bpm. Exam Location:  Inpatient Procedure: 2D Echo, Color Doppler and Cardiac Doppler                                MODIFIED REPORT: This report was modified by Cherlynn Kaiser MD on 08/19/2021 due to revision.  Indications:     I50.30* Unspecified diastolic (congestive) heart failure  History:         Patient has prior history of Echocardiogram examinations, most                  recent 05/26/2020. Risk Factors:Hypertension.  Sonographer:     Tawnya Crook Referring Phys:  Arnell Asal Margaree Mackintosh [facility] Diagnosing Phys: Cherlynn Kaiser MD  Sonographer Comments: Technically difficult study due to poor echo windows. IMPRESSIONS  1. Left ventricular ejection fraction, by estimation, is 60 to 65%. The left ventricle has normal function. Left ventricular endocardial border not optimally defined to evaluate regional wall motion. There is mild left ventricular hypertrophy. Left ventricular diastolic parameters are indeterminate.  2. Right ventricular systolic function is mildly reduced. The right ventricular size is normal. There is normal pulmonary artery systolic pressure. The estimated right ventricular systolic pressure is 89.2 mmHg.  3. The mitral valve is grossly normal. Trivial mitral valve regurgitation. No evidence of mitral stenosis.  4. The aortic valve is grossly normal. There is mild thickening of the aortic valve. Aortic valve regurgitation is not visualized. No aortic stenosis is present.  5. The inferior vena cava is normal in size with greater than 50% respiratory variability, suggesting right atrial pressure of 3 mmHg. Conclusion(s)/Recommendation(s): Poor quality exam with limited diagnostic yield. Recommend repeat limited echo when patient's heart rate has normalized. FINDINGS  Left Ventricle: Left ventricular ejection fraction, by estimation, is 60 to 65%. The left ventricle has normal function. Left ventricular endocardial border not optimally defined to evaluate regional wall motion. The left ventricular internal cavity size was normal in size. There is mild left ventricular hypertrophy. Left ventricular  diastolic parameters are indeterminate. Right Ventricle: The right ventricular size is normal. No increase in right ventricular wall thickness. Right ventricular systolic function is mildly reduced. There is normal pulmonary artery systolic pressure. The tricuspid regurgitant velocity is 2.71 m/s, and with an assumed right atrial pressure of 3 mmHg, the estimated right ventricular systolic pressure is 11.9 mmHg. Left Atrium: Left atrial size was normal in size. Right Atrium: Right atrial size was not well visualized. Pericardium: There is no evidence of pericardial effusion. Mitral Valve: The mitral valve is grossly normal. Trivial mitral valve regurgitation. No evidence of mitral valve stenosis. Tricuspid Valve: The tricuspid valve is normal in structure. Tricuspid valve regurgitation is mild. Aortic Valve: The aortic valve is grossly normal. There is mild thickening of the aortic valve. Aortic valve regurgitation is not visualized. No aortic stenosis is present. Aortic valve mean gradient measures 3.0 mmHg. Aortic valve peak gradient measures  5.3 mmHg. Aortic valve area,  by VTI measures 4.63 cm. Pulmonic Valve: The pulmonic valve was not well visualized. Pulmonic valve regurgitation is trivial. Aorta: The ascending aorta was not well visualized and the aortic root is normal in size and structure. Venous: The inferior vena cava is normal in size with greater than 50% respiratory variability, suggesting right atrial pressure of 3 mmHg. IAS/Shunts: The interatrial septum was not well visualized.  LEFT VENTRICLE PLAX 2D LVIDd:         2.10 cm  Diastology LVIDs:         1.50 cm  LV e' medial:  8.81 cm/s LV PW:         1.10 cm  LV e' lateral: 5.33 cm/s LV IVS:        1.10 cm LVOT diam:     2.40 cm LV SV:         59 LV SV Index:   36 LVOT Area:     4.52 cm  RIGHT VENTRICLE             IVC RV S prime:     11.70 cm/s  IVC diam: 1.60 cm TAPSE (M-mode): 1.7 cm LEFT ATRIUM             Index LA diam:        2.10 cm 1.28  cm/m LA Vol (A2C):   16.6 ml 10.11 ml/m LA Vol (A4C):   9.6 ml  5.86 ml/m LA Biplane Vol: 12.4 ml 7.55 ml/m  AORTIC VALVE                   PULMONIC VALVE AV Area (Vmax):    3.65 cm    PV Vmax:       0.68 m/s AV Area (Vmean):   2.93 cm    PV Peak grad:  1.8 mmHg AV Area (VTI):     4.63 cm AV Vmax:           115.00 cm/s AV Vmean:          79.800 cm/s AV VTI:            0.128 m AV Peak Grad:      5.3 mmHg AV Mean Grad:      3.0 mmHg LVOT Vmax:         92.70 cm/s LVOT Vmean:        51.700 cm/s LVOT VTI:          0.131 m LVOT/AV VTI ratio: 1.02  AORTA Ao Root diam: 3.10 cm TRICUSPID VALVE TR Peak grad:   29.4 mmHg TR Vmax:        271.00 cm/s  SHUNTS Systemic VTI:  0.13 m Systemic Diam: 2.40 cm Cherlynn Kaiser MD Electronically signed by Cherlynn Kaiser MD Signature Date/Time: 08/19/2021/12:00:36 AM    Final (Updated)    VAS [place] LOWER EXTREMITY VENOUS (DVT)  Result Date: 08/13/2021  Lower Venous DVT Study Patient [facility]  [facility]  Date of Exam:   08/13/2021 Medical Rec #: [id number]          Accession #:    [id number] Date of Birth: March 06, 1951          Patient Gender: F Patient Age:   73 years Exam Location:  [facility] Procedure:      VAS [place] LOWER EXTREMITY VENOUS (DVT) Referring Phys: Lelan Pons FRANCOIS --------------------------------------------------------------------------------  Indications: Stroke. Other Indications: PFO. Limitations: Poor ultrasound/tissue interface and patient positioning, patient immobility. Comparison Study: No prior studies. Performing Technologist: Oliver Hum  RVT  Examination Guidelines: A complete evaluation includes B-mode imaging, spectral Doppler, color Doppler, and power Doppler as needed of all accessible portions of each vessel. Bilateral testing is considered an integral part of a complete examination. Limited examinations for reoccurring indications may be performed as noted. The reflux portion of the exam is performed with the patient in reverse  Trendelenburg.  +---------+---------------+---------+-----------+----------+--------------+ RIGHT    CompressibilityPhasicitySpontaneityPropertiesThrombus Aging +---------+---------------+---------+-----------+----------+--------------+ CFV      Full           Yes      Yes                                 +---------+---------------+---------+-----------+----------+--------------+ SFJ      Full                                                        +---------+---------------+---------+-----------+----------+--------------+ FV Prox  Full                                                        +---------+---------------+---------+-----------+----------+--------------+ FV Mid   Full                                                        +---------+---------------+---------+-----------+----------+--------------+ FV DistalFull                                                        +---------+---------------+---------+-----------+----------+--------------+ PFV      Full                                                        +---------+---------------+---------+-----------+----------+--------------+ POP      Full           Yes      Yes                                 +---------+---------------+---------+-----------+----------+--------------+ PTV      Full                                                        +---------+---------------+---------+-----------+----------+--------------+ PERO     Full                                                        +---------+---------------+---------+-----------+----------+--------------+   +---------+---------------+---------+-----------+----------+--------------+  LEFT     CompressibilityPhasicitySpontaneityPropertiesThrombus Aging +---------+---------------+---------+-----------+----------+--------------+ CFV      Full           Yes      Yes                                  +---------+---------------+---------+-----------+----------+--------------+ SFJ      Full                                                        +---------+---------------+---------+-----------+----------+--------------+ FV Prox  Full                                                        +---------+---------------+---------+-----------+----------+--------------+ FV Mid   Full                                                        +---------+---------------+---------+-----------+----------+--------------+ FV Distal               Yes      Yes                                 +---------+---------------+---------+-----------+----------+--------------+ PFV      Full                                                        +---------+---------------+---------+-----------+----------+--------------+ POP      Full           Yes      Yes                                 +---------+---------------+---------+-----------+----------+--------------+ PTV      Full                                                        +---------+---------------+---------+-----------+----------+--------------+ PERO     Full                                                        +---------+---------------+---------+-----------+----------+--------------+     Summary: RIGHT: - There is no evidence of deep vein thrombosis in the lower extremity. However, portions of this examination were limited- see technologist comments above.  - No cystic structure found in the popliteal fossa.  LEFT: - There is no evidence of deep  vein thrombosis in the lower extremity. However, portions of this examination were limited- see technologist comments above.  - No cystic structure found in the popliteal fossa.  *See table(s) above for measurements and observations. Electronically signed by Orlie Pollen on 08/13/2021 at 7:07:26 PM.    Final    VAS [place] UPPER EXTREMITY VENOUS DUPLEX  Result Date: 08/16/2021 UPPER  VENOUS STUDY  Patient Name:  SANDRIA MCENROE [facility]  Date of Exam:   08/15/2021 Medical Rec #: [id number]          Accession #:    [id number] Date of Birth: 1951/01/23          Patient Gender: F Patient Age:   33 years Exam Location:  [facility] Procedure:      VAS [place] UPPER EXTREMITY VENOUS DUPLEX Referring Phys: Deno Etienne [facility] --------------------------------------------------------------------------------  Indications: Fever of unknown origin Limitations: Inability to keep arm extended. Comparison Study: No prior study Performing Technologist: Sharion Dove RVS  Examination Guidelines: A complete evaluation includes B-mode imaging, spectral Doppler, color Doppler, and power Doppler as needed of all accessible portions of each vessel. Bilateral testing is considered an integral part of a complete examination. Limited examinations for reoccurring indications may be performed as noted.  Right Findings: +----------+------------+---------+-----------+----------+---------------+ RIGHT     CompressiblePhasicitySpontaneousProperties    Summary     +----------+------------+---------+-----------+----------+---------------+ IJV           Full       Yes       Yes                              +----------+------------+---------+-----------+----------+---------------+ Subclavian               Yes       Yes                              +----------+------------+---------+-----------+----------+---------------+ Axillary      Full       Yes       Yes                              +----------+------------+---------+-----------+----------+---------------+ Brachial      Full       Yes       Yes                              +----------+------------+---------+-----------+----------+---------------+ Radial                                              patent by color +----------+------------+---------+-----------+----------+---------------+ Ulnar                                                Not visualized  +----------+------------+---------+-----------+----------+---------------+ Cephalic      Full                                                  +----------+------------+---------+-----------+----------+---------------+  Basilic                                             Not visualized  +----------+------------+---------+-----------+----------+---------------+  Left Findings: +----------+------------+---------+-----------+----------+-----------------+ LEFT      CompressiblePhasicitySpontaneousProperties     Summary      +----------+------------+---------+-----------+----------+-----------------+ IJV           Full       Yes       Yes                                +----------+------------+---------+-----------+----------+-----------------+ Subclavian               Yes       Yes                                +----------+------------+---------+-----------+----------+-----------------+ Axillary                 Yes       Yes                                +----------+------------+---------+-----------+----------+-----------------+ Brachial      Full       Yes       Yes                                +----------+------------+---------+-----------+----------+-----------------+ Radial                                               patent by color  +----------+------------+---------+-----------+----------+-----------------+ Ulnar                                                patent by color  +----------+------------+---------+-----------+----------+-----------------+ Cephalic      None                                       Chronic      +----------+------------+---------+-----------+----------+-----------------+ Basilic       None                                  Age Indeterminate +----------+------------+---------+-----------+----------+-----------------+  Summary:  Right: No evidence of deep vein thrombosis in the upper extremity.  No evidence of superficial vein thrombosis in the upper extremity.  Left: No evidence of deep vein thrombosis in the upper extremity. Findings consistent with age indeterminate superficial vein thrombosis involving the left basilic vein. 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  *See table(s) above for measurements and observations.  Diagnosing physician: Servando Snare MD Electronically signed by Servando Snare MD on 08/16/2021 at 4:45:02 PM.    Final    CT HEAD WO CONTRAST (CHARM STUDY)  Result Date: 08/14/2021 CLINICAL  DATA:  Mild neurologic decline EXAM: CT HEAD WITHOUT CONTRAST TECHNIQUE: Contiguous axial images were obtained from the base of the skull through the vertex without intravenous contrast. COMPARISON:  CTA head 05/26/2021, MRI brain 08/01/2021. 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 Periventricular white matter changes, likely the sequela of chronic small vessel ischemic disease. No acute infarct, hemorrhage, mass, mass effect, or midline shift. No definite hydrocephalus. Vascular: No hyperdense vessel or unexpected calcification. Skull: Normal. Negative for fracture or focal lesion. Sinuses/Orbits: No acute finding. Other: The mastoids are well aerated. IMPRESSION: No acute intracranial process. Electronically Signed   By: Merilyn Baba M.D.   On: 08/14/2021 01:04       Pertinent labs:  05/27/21 CSF WBC 2 CSF RBC 26 CSF Protein 82 CSF glucose 52 CSF VDRL -nonreactive CSF cytology - No malignant cells CSF oligoclonal bands - negative CSF mayo paraneoplstic panel - +GAD65 antibody  7/2-06/01/21 ANA positive DSDNA - negative RF - negative Hepatitis antibody panel - negative RPR - non-reactive SSA,SSB -negative ANCA - negative C3,C4 - normal ACE - 50 (normal) EBV Ab - positive IgG but not IgM, consistent with previous infection CRP 6.8 ESR 37 TSH normal B12 254 B1 normal HIV  negative     PHYSICAL EXAM  Temp:  [97.8 F (36.6 C)-99.1 F (37.3 C)] 99 F (37.2 C) (09/24 1203) Pulse Rate:  [125-240] 160 (09/24 1240) Resp:  [0-36] 33 (09/24 1240) BP: (91)/(65) 91/65 (09/24 0957) SpO2:  [31 %-100 %] 86 % (09/24 1240) Arterial Line BP: (55-119)/(43-81) 88/63 (09/24 1240) FiO2 (%):  [100 %] 100 % (09/24 1200) Weight:  [74 kg] 69 kg (09/24 0500)  General -frail malnourished looking elderly African-American lady, lethargic  Ophthalmologic - fundi not visualized due to noncooperation.  Cardiovascular - Regular rhythm and tachycardia.  Neuro -drowsy lethargic, partially open eyes to sternal rub.  Left gaze preference but able to lift to the right  PERRL. No facial droop. Tongue midline... Spontaneous movement in the right upper and lower extremity.  Withdraws both upper extremities well to painful stimulus but right more than.  Withdraws lower EXTR less briskly to painful stimulus bilaterally.  Sensation symmetrical bilaterally subjectively, b/l FTN intact slow but grossly intact although incomplete, gait not tested.      ASSESSMENT/PLAN Ms. BYANCA KASPER is a 70 y.o. female with history of recent bihemispheric strokes, chronic migraine, hypertension, presenting with  generalized weakness and spastic left sided hemiparesis, fever and lethargy.    Altered mental status secondary to metabolic encephalopathy  FUO Sepsis Renal insufficiency Abnormal liver enzymes  Intermittent fever Hypotension with tachycardia AKI Cre 0.85-1.07-1.70 Blood culture x2 negative ANA positive x3, ENA SM  positive, RF, CCPA, ANCA, dsDNA negative CRP 8.6->12->10.9->9.8->10.6 ESR 34, procalcitonin negative TSH and free T4 normal EEG normal ID on board Stroke this admission is too small to account for encephalopathy. Sepsis/FUO management per primary team   Stroke:  left caudate and putamen punctate infarcts likely small vessel disease CT 08/14/21 and 08/15/21 no acute  abnormalities MRI  08/02/2021 punctate areas of diffusion hyperintensity are present within the left caudate head and posterior left putamen. These likely represent areas of acute/subacute ischemia. Continued expected evolution of deep gray matter infarcts bilaterally. TCD bubble study 9/16 confirmed A near curtain effect was noted with and without valsalva. Moderate to large, Spencer grade 5 PFO.  LE and UE venous Doppler 9/16 and 9/18 negative for DVT EF 60 to 65% Loop recorder interrogation no A.  fib D-dimer 3.22->3.67->4.10-> 3.58-> 3.33 Hypercoagulable work-up positive lupus anticoagulant  DVT prophylaxis - Lovenox On aspirin 81 and Plavix 75 DAPT for 3 weeks and then aspirin alone. PT/OT recommend SNF Deposition pending  History of stroke 04/2021 admitted for left-sided weakness and fever, CT showed right BG/CR infarct.  MRI 6/29 confirmed right BG/CR infarct. CTA head and neck 6/29 no significant finding. 2D Echo with EF 60-65%.  7/5 Cerebral angiography was unremarkable. MRI 7/6 Numerous small acute bilateral cerebral and cerebellar infarcts compatible with emboli, could be embolic or related to angiogram procedure. TEE 7/7 EF 60-65%, Agitated saline contrast bubble study was positive for a large PFO.  No vegetation of thrombus seen. 7/12 Loop recorder placed. LDL 45, HgbA1c 5.9.  Discharged to CIR with DAPT for 3 weeks and then ASA alone.   Large PFO TEE 7/7 Agitated saline contrast bubble study was positive for a large PFO.  TCD bubble study 9/16 confirmed A near curtain effect was noted with and without valsalva. Moderate to large, Spencer grade 5 PFO.  Per Dr. Pearlean Brownie "ROPE score is 2 points indicating 0% chance that stroke is due to PFO." Follow up with Dr. Pearlean Brownie at [facility]   Left > Right brain abnormal signal on MRI - possible chronic microvascular ischemia. MRI brain showed Hyperintensity and mild enlargement of the left head of caudate. Hyperintensity also in the left thalamus and a  small amount of hyperintensity in the right medial thalamus. MRI with contrast Mild, largely vascular/perivascular appearing enhancement in the areas of signal abnormality in the deep gray nuclei as well as a small amount of leptomeningeal enhancement over the right and possibly left parietal convexities. No solid enhancing mass Asymptomatic for focal deficit at this time EEG cortical dysfunction arising from left temporal region, nonspecific etiology, no seizure EEG 9/22 normal CSF WBC 2, RBC 22, protein 82 and glucose 52.  Cryptococcal antigen, VDRL, oligoclonal bands, HSV PCR, cytology all negative, paraneoplastic panel + GAD65, but pt has no evidence of "stiff man syndrome", not sure about the significance ESR 37 and CRP 6.8 Autoimmune work up positive for ANA, but negative for ANCA, SSA/B, RF, dsDNA, C3/C4, ACE Repeat work up 07/2021 - ANA positive x3, ENA SM  positive, RF, CCPA, ANCA, dsDNA negative, CRP 8.6->12->10.9->9.8->10.6, ESR 34, procalcitonin negative Cerebral angiogram 7/5 - no vasculitis or vascular abnormality MRI 7/6 Decreased T2 signal abnormality and enhancement in the left greater than right deep gray nuclei and deep white matter tracts MRI 9/5 Stable diffuse T2 signal change in the basal ganglia bilaterally and left greater than right thalami. This likely reflects the sequela of chronic microvascular ischemia.  hypotension Hx of hypertension Stable on the low end Long-term BP goal normotensive   Lipid management Home meds:  none LDL 45, goal < 70 No statin as LDL well below goal   Other Stroke Risk Factors Advanced Age >/= 1  Migaine headache, on topamax, and takes imitrex prn   Other Active Problems Tachycardia AKI   Hospital day # 10    Stroke team was called back to evaluate patient due to worsening mental status for the last few days which is likely due to multifactorial encephalopathy and not due to seizures or new strokes Patient has developed AKI,  worsening liver function hypotension, tachycardia, continues to have intermittent fever, and I discussed with Dr. Bolivar Haw.  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  She does have a large patent foramen ovale "in the absence of definite DVT or pulmonary embolism and presence of only subcortical infarcts and would not think paradoxical embolism is the cause of her strokes as they appear to be deep in the brain likely from small vessel disease.  Positive lupus anticoagulant may be an acute phase reactant to her febrile/rheumatological illness and I am not sure anticoagulation is necessary indicated unless repeat labs remain positive at 3 to 4 months.  Recommend discussion with next of kin and family goals of care as she would likely need PEG tube placement and prolonged nursing home stay if family chooses to be aggressive.  Stroke team will be available as needed in the future.  Kindly call for questions.  Stroke team will sign off This patient is critically ill and at significant risk of neurological worsening, death and care requires constant monitoring of vital signs, hemodynamics,respiratory and cardiac monitoring, extensive review of multiple databases, frequent neurological assessment, discussion with family, other specialists and medical decision making of high complexity.I have made any additions or clarifications directly to the above note.This critical care time does not reflect procedure time, or teaching time or supervisory time of PA/NP/Med Resident etc but could involve care discussion time.  I spent 30 minutes of neurocritical care time  in the care of  this patient.     Antony Contras, MD Stroke Neurology 2021/09/09 1:07 PM

## 2021-08-28 NOTE — Progress Notes (Signed)
[place] Progress Note Patient Name: Michaela Rios DOB: 1951/01/22 MRN: [id number]   Date of Service  08-27-2021  HPI/Events of Note  Oliguria - Urine output = 25 mL for this shift. No CVL or CVP measurement. LVEF = 60-65%. Albumin = 1.9.  eICU Interventions  Plan: 25% Albumin 12.5 gm IV now.     Intervention Category Major Interventions: Other:  Lysle Dingwall 2021/08/27, 12:11 AM

## 2021-08-28 NOTE — Progress Notes (Signed)
Pharmacy Antibiotic Note  Michaela Rios is a 70 y.o. female admitted on 08/11/2021 with  FUO  with recent admission of extensive work-up.  Pharmacy has been consulted for antibiotic dosing for sepsis -unknown source. PCN and amoxicillin allergy noted, Tolerated Cephalosporins in the past and this admission.   WBC continues to climb at 39.6, SCr continues to rise at 2.24. Temperature max is 100.8 for last 24 hours.  Lactic acid is trending down. CRP is trending down. LFTs are bumped.  Plan: Change to Merrem 1g IV every 12 hours.  Continue Vancomycin 1000 mg IV every 48 hours. Expected AUC 446 (SCr used 2.24) F/u clinical improvement, cultures, and LOT  May consider dosing vancomycin based on levels if SCr continues to climb  Height: 5\' 6"  (167.6 cm) Weight: 69 kg (152 lb 1.9 oz) IBW/kg (Calculated) : 59.3  Temp (24hrs), Avg:98.6 F (37 C), Min:97.8 F (36.6 C), Max:99.1 F (37.3 C)  Recent Labs  Lab 08/16/21 0713 08/16/21 1015 08/17/21 0505 08/18/21 0059 08/19/21 1019 08/19/21 2204 08/20/21 0417 07-Sep-2021 0755  WBC  --   --  13.4* 10.7* 20.2*  --  32.9* 39.6*  CREATININE  --   --  0.85 1.07* 1.70*  --  2.16* 2.24*  LATICACIDVEN 1.6 2.0*  --   --   --  4.9* 5.0* 2.9*    Estimated Creatinine Clearance: 21.9 mL/min (A) (by C-G formula based on SCr of 2.24 mg/dL (H)).    Allergies  Allergen Reactions   Amitriptyline Hcl     Changed personality   Shellfish Allergy Nausea And Vomiting and Other (See Comments)    Tested "high" on allergy test   Lisinopril Cough    .   Amoxicillin Rash   Cephalexin Rash   Penicillins Rash    Antimicrobials this admission: Vanc 9/13 >> 9/14; 9/23 >> Aztreonam 9/13 >> 9/14 Flagyl x1 in ED 9/15 CTX >> 9/17 9/23 Zosyn >>9/24 9/24 Merrem >>  Microbiology results: 9/13 BCx: NGTD 9/13 UCx: kleb pneumo (R ampicillin) 9/24 UCx >> 9/22 BCx >> 9/22 UCx insignificant growth   Thank you for allowing pharmacy to be a part of this  patient's care.  Sloan Leiter, PharmD, BCPS, BCCCP Clinical Pharmacist Please refer to [facility] for [place] numbers September 07, 2021 12:32 PM

## 2021-08-28 NOTE — Progress Notes (Signed)
Time of death 2008, apical heart sounds and breath sounds absent, verified by Loma Boston, RN and Forestine Na, RN. Family at bedside at time of death, emotional support given.

## 2021-08-28 NOTE — Progress Notes (Signed)
ANTICOAGULATION CONSULT NOTE - Follow Up Consult  Pharmacy Consult for IV Heparin Indication:  Possible portal vein thrombosis and abnormal lupus anticoagulant    Allergies  Allergen Reactions   Amitriptyline Hcl     Changed personality   Shellfish Allergy Nausea And Vomiting and Other (See Comments)    Tested "high" on allergy test   Lisinopril Cough    .   Amoxicillin Rash   Cephalexin Rash   Penicillins Rash    Patient Measurements: Height: 5\' 6"  (167.6 cm) Weight: 69 kg (152 lb 1.9 oz) IBW/kg (Calculated) : 59.3 Heparin Dosing Weight: 69kg  Vital Signs: Temp: 99 F (37.2 C) (09/24 1203) Temp Source: Axillary (09/24 1203) BP: 91/65 (09/24 0957) Pulse Rate: 160 (09/24 1240)  Labs: Recent Labs    08/19/21 1019 08/20/21 0417 08/20/21 0917 08/20/21 1741 09-14-2021 0755 14-Sep-2021 1222  HGB 14.6 13.1   < > 13.6 12.0 13.9  HCT 45.0 42.2   < > 40.0 35.7* 41.0  PLT 469* 468*  --   --  350  --   CREATININE 1.70* 2.16*  --   --  2.24*  --   CKTOTAL  --  58  --   --   --   --    < > = values in this interval not displayed.    Estimated Creatinine Clearance: 21.9 mL/min (A) (by C-G formula based on SCr of 2.24 mg/dL (H)).   Medications:  Medications Prior to Admission  Medication Sig Dispense Refill Last Dose   acetaminophen (TYLENOL) 650 MG CR tablet Take 1 tablet (650 mg total) by mouth every 8 (eight) hours as needed for pain.   Past Week   amLODipine (NORVASC) 5 MG tablet TAKE ONE TABLET BY MOUTH EVERY MORNING (Patient taking differently: Take 5 mg by mouth daily.) 90 tablet 1 08/09/2021   aspirin EC 81 MG EC tablet Take 1 tablet (81 mg total) by mouth daily. Swallow whole. 30 tablet 11 08/09/2021   buPROPion (WELLBUTRIN XL) 300 MG 24 hr tablet TAKE ONE TABLET BY MOUTH EVERY MORNING (Patient taking differently: Take 300 mg by mouth daily.) 90 tablet 2 08/08/2021   butalbital-acetaminophen-caffeine (FIORICET) 50-325-40 MG tablet Take 1 tablet by mouth every 6 (six) hours  as needed for headache or migraine. 14 tablet 0 unk   calcium carbonate (OS-CAL - DOSED IN MG OF ELEMENTAL CALCIUM) 1250 (500 Ca) MG tablet Take 1 tablet (500 mg of elemental calcium total) by mouth daily with breakfast.   unk   EPINEPHrine (EPIPEN 2-PAK) 0.3 mg/0.3 mL IJ SOAJ injection Inject 0.3 mLs (0.3 mg total) into the muscle as needed for anaphylaxis. 1 each 0 unk   furosemide (LASIX) 20 MG tablet Take 20 mg by mouth every morning.   08/09/2021   hydrALAZINE (APRESOLINE) 25 MG tablet TAKE ONE TABLET BY MOUTH EVERY MORNING and TAKE ONE TABLET BY MOUTH EVERYDAY AT BEDTIME (Patient taking differently: Take 25 mg by mouth in the morning and at bedtime.) 90 tablet 6 08/09/2021   metoprolol succinate (TOPROL-XL) 100 MG 24 hr tablet TAKE ONE TABLET BY MOUTH EVERY MORNING WITH OR immediately following A meal (Patient taking differently: Take 100 mg by mouth daily.) 90 tablet 3 08/09/2021 at 1100   Multiple Vitamin (MULTIVITAMIN WITH MINERALS) TABS tablet Take 1 tablet by mouth daily.   Past Month   ondansetron (ZOFRAN) 4 MG tablet Take 1 tablet (4 mg total) by mouth every 6 (six) hours as needed for nausea. 20 tablet 0 unk  pantoprazole (PROTONIX) 40 MG tablet Take 1 tablet (40 mg total) by mouth daily. (Patient taking differently: Take 40 mg by mouth daily as needed (heartburn).)   unk   potassium chloride (KLOR-CON) 10 MEQ tablet TAKE ONE TABLET BY MOUTH ONCE DAILY (Patient taking differently: Take 10 mEq by mouth daily.) 30 tablet 0 Past Month   tiZANidine (ZANAFLEX) 4 MG tablet TAKE ONE TABLET BY MOUTH twice daily AS NEEDED FOR muscle SPASMS (Patient taking differently: Take 4 mg by mouth every 12 (twelve) hours as needed for muscle spasms.) 30 tablet 0 Past Week   topiramate (TOPAMAX) 100 MG tablet TAKE ONE TABLET BY MOUTH TWICE DAILY (Patient taking differently: Take 100 mg by mouth 2 (two) times daily.) 60 tablet 0 08/09/2021   venlafaxine XR (EFFEXOR-XR) 75 MG 24 hr capsule TAKE THREE CAPSULES BY  MOUTH EVERY MORNING (Patient taking differently: Take 75-150 mg by mouth See admin instructions. Taking 2 capsules ( 150 mg) in the AM and 1 capsule (75 mg) in the PM) 270 capsule 6 08/09/2021   vitamin B-12 1000 MCG tablet Take 1 tablet (1,000 mcg total) by mouth daily.   Past Month   zolpidem (AMBIEN) 10 MG tablet TAKE ONE TABLET BY MOUTH EVERYDAY AT BEDTIME AS NEEDED FOR SLEEP (Patient taking differently: Take 10 mg by mouth at bedtime as needed for sleep.) 30 tablet 1 08/09/2021   LORazepam (ATIVAN) 1 MG tablet Take 1 tablet (1 mg total) by mouth 2 (two) times daily as needed. (Patient taking differently: Take 1 mg by mouth 2 (two) times daily as needed for anxiety.) 180 tablet 0 unk    Assessment: 70 years of age female with history bilateral strokes in June 2022 and noted large PFO on TEE in July 2022 now with worsening clinical status including altered mentation, increased WBC, AKI, elevated LFTs, and concern for sepsis admitted to ICU on 9/23. Patient noted to have positive ANA and past abnormal lupus anti-coagulant testing. RUQ with doppler flow being checked to rule out possible portal vein thrombosis. Pharmacy consulted to start IV Heparin therapy for possible clot. Neurology re-consulted for patient - MRI 9/4 stable, no bleeding noted.    Goal of Therapy:  Heparin level 0.3-0.7 units/ml Monitor platelets by anticoagulation protocol: Yes   Plan:  Start IV Heparin (no bolus with recent stroke history) at 1000 units/hr Monitor heparin level in 8 hours   Brain Hilts 2021-09-16,1:21 PM  Addendum: Transition to comfort care. Will discontinue Heparin and levels.

## 2021-08-28 NOTE — Progress Notes (Signed)
[place] Progress Note Patient Name: Michaela Rios DOB: 10/21/1951 MRN: [id number]   Date of Service  15-Sep-2021  HPI/Events of Note  EKG reveals sinus tachycardia with a rate = 151. BP = 112/71.  eICU Interventions  Patient already has an order for Metoprolol 5 mg IV Q 8 hours PRN HR > 100. Nurse instructed to give Metoprolol 2.5 mg IV now. If her BP does not drop and HR does not decrease, go ahead and give another dose of Metoprolol 2.5 mg IV for a total of 5 mg.      Intervention Category Major Interventions: Arrhythmia - evaluation and management  [name] 2021/09/15, 5:30 AM

## 2021-08-28 NOTE — Progress Notes (Signed)
Patient's tremors in head, neck, bilateral shoulders, bilateral upper extremities have become visibly worse. Heart rate now in 150s. [place] notified. 12-Lead EKG obtained. Will continue to monitor closely.

## 2021-08-28 NOTE — Progress Notes (Signed)
PCCM Update Date: I spoke with patient' son and husband at the bedside. We discussed moving forward with medical treatment options with anticoagulation and steroids for possible lupus causing her current deterioration. We also discussed transition of care to comfort measures and what that would entail. Given that her long term prognosis is very poor as she has been sick for multiple months, they have decided to focus on her comfort.   Orders have been placed for DNR code status along with medications to transition to comfort care.   Freda Jackson, MD [facility] Office: [phone]   See Amion for personal pager PCCM on call pager [phone] until 7pm. Please call Elink 7p-7a. [phone]

## 2021-08-28 NOTE — Death Summary Note (Signed)
DEATH SUMMARY   Patient Details  Name: Michaela Rios MRN: [id number] DOB: 1951-02-04  Admission/Discharge Information   Admit Date:  Aug 13, 2021  Date of Death: Date of Death: 08-24-2021  Time of Death: Time of Death: 02-15-2008  Length of Stay: February 09, 2023  Referring Physician: Binnie Rail, MD   Reason(s) for Hospitalization  Sepsis due to UTI  Diagnoses  Preliminary cause of death:  Septic Shock  Secondary Diagnoses (including complications and co-morbidities):  Active Problems:   Anxiety   Depression   Essential hypertension   GERD   Migraines   Sepsis secondary to UTI ([place])   Weakness   Cerebrovascular accident (CVA) ([place])   Fever   Protein-calorie malnutrition, severe Acute on Chronic Renal Failure Elevated Liver Enzymes Altered Mental Status Failure to [facility] Course (including significant findings, care, treatment, and services provided and events leading to death)  Michaela Rios is a 70 y.o. year old female with history of CVA, CKD stage II, PVD, depression and chronic back pain presented to the ER on 2021-08-13 with complaints of progressive weakness.   Patient had recently had a complicated hospitalization 6/27 -7/12 where patient had initially presented with fall and weakness and was incidentally found to be febrile meeting SIRS criteria.  An extensive work-up to evaluate the exact cause of the febrile illness.  Patient had undergone CT scan of the brain which showed multifocal hypoattenuations and subsequently underwent LP.  Cultures and studies were otherwise unremarkable. TEE which did not show signs of vegetation or endocarditis, but did note a PFO.  Patient also underwent CT renal angiogram which ruled out vasculitis, vascular occlusion, aneurysm, and AVM dural fistulous.  However, repeat MRI on 7/5 noted numerous small acute bilateral cerebral thought to be possibly periprocedural and less likely related to PFO.  Patient had a loop recorder placed and  was recommended to come continue on dual antiplatelet therapy for least 3 weeks and then aspirin alone follow-up with neurology and have a repeat MRI in 1 month.  Patient had gone to rehab and seemed to be improving.  A repeat MRI of the brain has been performed on 9/4, which noted expected evolution of bilateral infarcts with additional areas of acute/subacute infarcts noted of the left caudate head and posterior left putamen.    5 days prior to admission the patient had progressively declined.  She went from being able to walk to not being able to sit up on her own and he reports that she was just globally weak. At home her husband had not noticed that the patient had had a fever until picked up by EMS. She was treated with broad spectrum antibiotics. Neurology service was consulted for further evaluation. She was also noted to have acute kidney injury. Her clinical status continue to decline while on the progressive care unit and PCCM was consulted on 9/23 for hypotension. She continued to spike intermittent fevers since admission despite antibiotic therapy. CT Chest Abdomen and pelvis performed on 08/17/21 did not indicate a clear source for infection. She was transferred to the ICU on 9/23 for severe metabolic acidosis requiring bicarb drip and encephalopathy. On 08/25/2023 she became more hypotensive requiring vasopressor support. Chart review was performed with noted ANA+, Anti-Sm Ab+ and abnormal lupus anticoagulant panel concerning for lupus. Neurology was re-consulted for further input on possible inflammatory disorder affecting her neurologic function. Neurology and PCCM both agreed that a trial of steroid therapy and anticoagulation would be reasonable but given her overall  progressive decline since her initial strokes in 04/2021 that her overall outlook was poor. Treatment options were discussed with the patient's son and husband. Ultimately the decision was made to transition the patient to comfort care and  she passed away on 2021/08/24 at 8:09pm.  Pertinent Labs and Studies  Significant Diagnostic Studies CT HEAD WO CONTRAST (5MM)  Result Date: 08/15/2021 CLINICAL DATA:  Neuro deficit, stroke suspected right-sided weakness EXAM: CT HEAD WITHOUT CONTRAST TECHNIQUE: Contiguous axial images were obtained from the base of the skull through the vertex without intravenous contrast. COMPARISON:  08/14/2021 FINDINGS: Brain: No significant change from prior exam. Redemonstrated right basal ganglia and left thalamic infarcts. Periventricular white matter changes, likely the sequela of chronic small vessel ischemic disease. No acute cortical infarct, hemorrhage, mass, mass effect, or midline shift. No hydrocephalus. Vascular: No hyperdense vessel Skull: Normal. Negative for fracture or focal lesion. Sinuses/Orbits: No acute finding. Other: The mastoids are well aerated. IMPRESSION: No acute intracranial process. No significant change from the prior exam. Electronically Signed   By: Merilyn Baba M.D.   On: 08/15/2021 03:09   CT CHEST WO CONTRAST  Result Date: 08/17/2021 CLINICAL DATA:  Left lower lobe opacity on recent chest radiograph. History of strokes. Elevated white blood cell count. Pneumonia. EXAM: CT CHEST WITHOUT CONTRAST TECHNIQUE: Multidetector CT imaging of the chest was performed following the standard protocol without IV contrast. COMPARISON:  Plain film 08/15/2021 and CTA chest 05/24/2021 FINDINGS: Cardiovascular: Aortic atherosclerosis. Tortuous thoracic aorta. Normal heart size, without pericardial effusion. Mediastinum/Nodes: No mediastinal or definite hilar adenopathy, given limitations of unenhanced CT. Surgical changes at the gastroesophageal junction. Lungs/Pleura: New trace pleural fluid since the prior CT. Patent airways. Motion degradation in the lung bases. Left lower lobe minimal volume loss and compressive dependent atelectasis. Subpleural right middle lobe 4 mm nodule on 98/6 is similar to on  the 12/03/2004 exam, most likely a subpleural lymph node. Upper Abdomen: Normal imaged portions of the liver, spleen, stomach, pancreas, gallbladder. Musculoskeletal: No acute osseous abnormality. IMPRESSION: 1. Mildly motion degraded exam. 2. Trace left pleural fluid with adjacent volume loss and dependent atelectasis. No evidence of pneumonia. 3.  Aortic Atherosclerosis (ICD10-I70.0). Electronically Signed   By: Abigail Miyamoto M.D.   On: 08/17/2021 13:52   CT Angio Chest Pulmonary Embolism (PE) W or WO Contrast  Result Date: 08/17/2021 CLINICAL DATA:  PE suspected, high probability EXAM: CT ANGIOGRAPHY CHEST WITH CONTRAST TECHNIQUE: Multidetector CT imaging of the chest was performed using the standard protocol during bolus administration of intravenous contrast. Multiplanar CT image reconstructions and MIPs were obtained to evaluate the vascular anatomy. CONTRAST:  160mL OMNIPAQUE IOHEXOL 350 MG/ML SOLN COMPARISON:  CTA chest 05/24/2021, correlation is also made with CT chest without contrast 08/17/2021 FINDINGS: Cardiovascular: Satisfactory opacification of the pulmonary arteries to the segmental level. No evidence of pulmonary embolism. Normal heart size. No pericardial effusion. Aortic atherosclerosis. Mediastinum/Nodes: No enlarged mediastinal, hilar, or axillary lymph nodes. Thyroid gland, trachea, and esophagus demonstrate no significant findings. Lungs/Pleura: Trace left-greater-than-right pleural fluid, with minimal associated atelectasis. Unchanged 4 mm nodule in the right middle lobe (series 15, image 262), which has been stable over multiple prior exams and may represent a subpleural lymph node. Upper Abdomen: No acute abnormality. High-density material within the stomach was present on the noncontrast exam and likely represents ingested material. Musculoskeletal: No acute osseous abnormality. Review of the MIP images confirms the above findings. IMPRESSION: 1. Negative for pulmonary embolism. 2.  Redemonstrated trace left-greater-than-right pleural fluid with  associated atelectasis. 3.  Aortic Atherosclerosis (ICD10-I70.0). Electronically Signed   By: Merilyn Baba M.D.   On: 08/17/2021 19:41   MR Brain W Wo Contrast  Result Date: 08/02/2021 CLINICAL DATA:  Stroke, follow-up.  Previous abnormal MRI. EXAM: MRI HEAD WITHOUT AND WITH CONTRAST TECHNIQUE: Multiplanar, multiecho pulse sequences of the brain and surrounding structures were obtained without and with intravenous contrast. CONTRAST:  65mL MULTIHANCE GADOBENATE DIMEGLUMINE 529 MG/ML IV SOLN COMPARISON:  MR head without and with contrast 06/02/2021 05/26/21 FINDINGS: Brain: Continued expected evolution of deep gray matter infarcts noted. No residual enhancement is present. Punctate areas of diffusion signal surround the infarct involving the right internal capsule and caudate. Additional punctate areas of diffusion hyperintensity noted within the left caudate head and posterior left putamen. Areas of confluent T2 signal change are noted in the basal ganglia bilaterally and left greater than right thalami without significant interval change. No acute hemorrhage or mass lesion is present. The ventricles are of normal size. No significant extraaxial fluid collection is present. Postcontrast images are otherwise within normal limits. Vascular: Flow is present in the major intracranial arteries. Skull and upper cervical spine: Mild degenerative changes are noted at C2-3. The craniocervical junction is normal. Midline structures are within normal limits. Marrow signal is unremarkable. Sinuses/Orbits: The paranasal sinuses and mastoid air cells are clear. The globes and orbits are within normal limits. IMPRESSION: 1. Continued expected evolution of deep gray matter infarcts bilaterally. 2. Additional punctate areas of diffusion hyperintensity are present within the left caudate head and posterior left putamen. These likely represent areas of acute/subacute  ischemia. 3. Stable diffuse T2 signal change in the basal ganglia bilaterally and left greater than right thalami. This likely reflects the sequela of chronic microvascular ischemia. Electronically Signed   By: San Morelle M.D.   On: 08/02/2021 08:29   CT ABDOMEN PELVIS W CONTRAST  Result Date: 08/15/2021 CLINICAL DATA:  Fever, abdominal pain EXAM: CT ABDOMEN AND PELVIS WITH CONTRAST TECHNIQUE: Multidetector CT imaging of the abdomen and pelvis was performed using the standard protocol following bolus administration of intravenous contrast. CONTRAST:  115mL OMNIPAQUE IOHEXOL 300 MG/ML  SOLN COMPARISON:  06/24/2021 FINDINGS: Lower chest: No acute abnormality. Unchanged elevation of the left hemidiaphragm. Hepatobiliary: No solid liver abnormality is seen. No gallstones, gallbladder wall thickening, or biliary dilatation. Pancreas: Unremarkable. No pancreatic ductal dilatation or surrounding inflammatory changes. Spleen: Normal in size without significant abnormality. Adrenals/Urinary Tract: Adrenal glands are unremarkable. Kidneys are normal, without renal calculi, solid lesion, or hydronephrosis. Heterogeneous material within the dependent left urinary bladder, dependently hyperdense (series 3, image 73). Stomach/Bowel: Stomach is within normal limits. Appendix appears normal. No evidence of bowel wall thickening, distention, or inflammatory changes. Large burden of stool throughout the distal colon. Vascular/Lymphatic: Aortic atherosclerosis. No enlarged abdominal or pelvic lymph nodes. Reproductive: Status post hysterectomy. Other: No abdominal wall hernia. Anasarca. No abdominopelvic ascites. Musculoskeletal: No acute or significant osseous findings. IMPRESSION: 1. Heterogeneous material within the dependent left urinary bladder, dependently hyperdense. This may reflect some combination of excreted contrast with mixing artifact, clot, or small bladder stones/calcific debris. Endoluminal bladder mass  is difficult to strictly exclude. Recommend delayed phase CT urogram to further evaluate. 2. Large burden of stool throughout the distal colon. 3. Anasarca. Aortic Atherosclerosis (ICD10-I70.0). Electronically Signed   By: Eddie Candle M.D.   On: 08/15/2021 12:50   DG Chest Port 1 View  Result Date: 08/19/2021 CLINICAL DATA:  Pneumonia. EXAM: PORTABLE CHEST 1 VIEW COMPARISON:  Chest  radiograph dated 09/14/2021. FINDINGS: No focal consolidation, pleural effusion, pneumothorax. Stable cardiac silhouette. No acute osseous pathology. Cardiac monitor device. IMPRESSION: No active disease. Electronically Signed   By: Anner Crete M.D.   On: 08/19/2021 20:43   DG Chest Port 1 View  Result Date: 08/15/2021 CLINICAL DATA:  Retrocardiac density. EXAM: PORTABLE CHEST 1 VIEW COMPARISON:  08/14/2021 FINDINGS: Right lung clear. Persistent retrocardiac streaky opacity, likely atelectasis or scar. The cardiopericardial silhouette is within normal limits for size. The visualized bony structures of the thorax show no acute abnormality. IMPRESSION: Persistent retrocardiac streaky opacity, likely atelectasis or scar. This appears to be new since CT scan of 05/25/2021. Follow-up chest x-ray could be used to ensure resolution. Electronically Signed   By: Misty Stanley M.D.   On: 08/15/2021 08:07   DG Chest Port 1 View  Result Date: 08/14/2021 CLINICAL DATA:  Shortness of breath EXAM: PORTABLE CHEST 1 VIEW COMPARISON:  08/18/2021 FINDINGS: Hazy and streaky retrocardiac opacity. Aortic tortuosity. Normal heart size. Implantable loop recorder. The lungs are clear of edema. No pleural fluid or pneumothorax. IMPRESSION: Retrocardiac opacity with shape favoring atelectasis. Electronically Signed   By: Jorje Guild M.D.   On: 08/14/2021 09:14   DG Chest Port 1 View  Result Date: 08/20/2021 CLINICAL DATA:  Concern for sepsis EXAM: PORTABLE CHEST 1 VIEW COMPARISON:  Chest x-ray dated July 18th 2022 FINDINGS: Cardiac and  mediastinal contours are unchanged when accounting for differences in lung volumes. Left basilar atelectasis. Lungs otherwise clear. No pleural effusion or pneumothorax. Loop recorder device noted. IMPRESSION: No active disease. Electronically Signed   By: Yetta Glassman M.D.   On: 08/20/2021 11:06   DG Abd Portable 1V  Result Date: 08/20/2021 CLINICAL DATA:  Encounter for feeding tube placement. EXAM: PORTABLE ABDOMEN - 1 VIEW COMPARISON:  08/20/2021 at 1157 hours FINDINGS: There is a feeding tube in the abdomen. The tip is in the gastric antrum region. Cardiac loop recorder is identified in the lower chest. Nonobstructive bowel gas pattern. IMPRESSION: Feeding tube tip is in the gastric antrum region. Electronically Signed   By: Markus Daft M.D.   On: 08/20/2021 15:38   DG Abd Portable 1V  Result Date: 08/20/2021 CLINICAL DATA:  Encounter for feeding tube placement Z46.59 (ICD-10-CM) EXAM: PORTABLE ABDOMEN - 1 VIEW COMPARISON:  Chest radiograph 08/19/2021. FINDINGS: Feeding tube courses below the diaphragm and is looped back on itself in the stomach. Paucity of bowel gas without evidence of dilated bowel loops. Visualized lung bases are clear. Loop recorder projects over the left lower chest. Facet arthropathy in the lower lumbar spine. Broad rotatory levocurvature of the thoracic and lumbar spine. IMPRESSION: Feeding tube courses below the diaphragm and is looped back on itself in the stomach. Electronically Signed   By: Margaretha Sheffield M.D.   On: 08/20/2021 12:47   EEG adult  Result Date: 08/19/2021 Derek Jack, MD     08/19/2021  1:23 PM Routine EEG Report MARILUZ CRESPO is a 70 y.o. female with a history of shaking spells who is undergoing an EEG to evaluate for seizures. Report: This EEG was acquired with electrodes placed according to the International 10-20 electrode system (including Fp1, Fp2, F3, F4, C3, C4, P3, P4, O1, O2, T3, T4, T5, T6, A1, A2, Fz, Cz, Pz). The following  electrodes were missing or displaced: none. The occipital dominant rhythm was 9 Hz. This activity is reactive to stimulation. Drowsiness was manifested by background fragmentation; deeper stages of sleep were not  identified. There was no focal slowing. There were no interictal epileptiform discharges. There were no electrographic seizures identified. Photic stimulation and hyperventilation were not performed. Impression: This EEG was obtained while awake and drowsy and is normal. Low amplitude high volume shaking observed on video throughout the recording had no EEG correlate and is not favored to be epileptic.   Clinical Correlation: Normal EEGs, however, do not rule out epilepsy. Su Monks, MD [facility] [phone] If 7pm- 7am, please page neurology on call as listed in [place].   VAS [place] TRANSCRANIAL DOPPLER W BUBBLES  Result Date: 08/16/2021  Transcranial Doppler with Bubble Patient Name:  JAYMEE TILSON [facility]  Date of Exam:   08/13/2021 Medical Rec #: [id number]          Accession #:    [id number] Date of Birth: [birth year]          Patient Gender: F Patient Age:   24 years Exam Location:  [facility] Procedure:      VAS [place] TRANSCRANIAL DOPPLER W BUBBLES Referring Phys: Lelan Pons FRANCOIS --------------------------------------------------------------------------------  Indications: Stroke. Comparison Study: No prior studies. Performing Technologist: Oliver Hum RVT  Examination Guidelines: A complete evaluation includes B-mode imaging, spectral Doppler, color Doppler, and power Doppler as needed of all accessible portions of each vessel. Bilateral testing is considered an integral part of a complete examination. Limited examinations for reoccurring indications may be performed as noted.  Summary:  A vascular evaluation was performed. The right middle cerebral artery was studied. An IV was inserted into the patient's right Cephalic vein.. Verbal informed consent was obtained.  A near curtain  effect was noted with and without valsalva. Moderate to large, Spencer grade 5 PFO. Positive TCD Bubble study indicative of a medium to large size right to left shunt *See table(s) above for TCD measurements and observations.  Diagnosing physician: Antony Contras MD Electronically signed by Antony Contras MD on 08/16/2021 at 9:16:53 AM.    Final    ECHOCARDIOGRAM COMPLETE  Result Date: 08/19/2021    ECHOCARDIOGRAM REPORT   Patient Name:   CRAIG WISNEWSKI [facility] Date of Exam: 08/18/2021 Medical Rec #:  [id number]         Height:       66.0 in Accession #:    [id number]        Weight:       125.7 lb Date of Birth:  1951-05-01         BSA:          1.641 m Patient Age:    44 years          BP:           88/76 mmHg Patient Gender: F                 HR:           121 bpm. Exam Location:  Inpatient Procedure: 2D Echo, Color Doppler and Cardiac Doppler                               MODIFIED REPORT: This report was modified by Cherlynn Kaiser MD on 08/19/2021 due to revision.  Indications:     I50.30* Unspecified diastolic (congestive) heart failure  History:         Patient has prior history of Echocardiogram examinations, most                  recent 05/26/2020. Risk Factors:Hypertension.  Sonographer:     Tawnya Crook Referring Phys:  Arnell Asal Margaree Mackintosh [facility] Diagnosing Phys: Cherlynn Kaiser MD  Sonographer Comments: Technically difficult study due to poor echo windows. IMPRESSIONS  1. Left ventricular ejection fraction, by estimation, is 60 to 65%. The left ventricle has normal function. Left ventricular endocardial border not optimally defined to evaluate regional wall motion. There is mild left ventricular hypertrophy. Left ventricular diastolic parameters are indeterminate.  2. Right ventricular systolic function is mildly reduced. The right ventricular size is normal. There is normal pulmonary artery systolic pressure. The estimated right ventricular systolic pressure is 87.6 mmHg.  3. The mitral valve is grossly normal.  Trivial mitral valve regurgitation. No evidence of mitral stenosis.  4. The aortic valve is grossly normal. There is mild thickening of the aortic valve. Aortic valve regurgitation is not visualized. No aortic stenosis is present.  5. The inferior vena cava is normal in size with greater than 50% respiratory variability, suggesting right atrial pressure of 3 mmHg. Conclusion(s)/Recommendation(s): Poor quality exam with limited diagnostic yield. Recommend repeat limited echo when patient's heart rate has normalized. FINDINGS  Left Ventricle: Left ventricular ejection fraction, by estimation, is 60 to 65%. The left ventricle has normal function. Left ventricular endocardial border not optimally defined to evaluate regional wall motion. The left ventricular internal cavity size was normal in size. There is mild left ventricular hypertrophy. Left ventricular diastolic parameters are indeterminate. Right Ventricle: The right ventricular size is normal. No increase in right ventricular wall thickness. Right ventricular systolic function is mildly reduced. There is normal pulmonary artery systolic pressure. The tricuspid regurgitant velocity is 2.71 m/s, and with an assumed right atrial pressure of 3 mmHg, the estimated right ventricular systolic pressure is 81.1 mmHg. Left Atrium: Left atrial size was normal in size. Right Atrium: Right atrial size was not well visualized. Pericardium: There is no evidence of pericardial effusion. Mitral Valve: The mitral valve is grossly normal. Trivial mitral valve regurgitation. No evidence of mitral valve stenosis. Tricuspid Valve: The tricuspid valve is normal in structure. Tricuspid valve regurgitation is mild. Aortic Valve: The aortic valve is grossly normal. There is mild thickening of the aortic valve. Aortic valve regurgitation is not visualized. No aortic stenosis is present. Aortic valve mean gradient measures 3.0 mmHg. Aortic valve peak gradient measures  5.3 mmHg. Aortic  valve area, by VTI measures 4.63 cm. Pulmonic Valve: The pulmonic valve was not well visualized. Pulmonic valve regurgitation is trivial. Aorta: The ascending aorta was not well visualized and the aortic root is normal in size and structure. Venous: The inferior vena cava is normal in size with greater than 50% respiratory variability, suggesting right atrial pressure of 3 mmHg. IAS/Shunts: The interatrial septum was not well visualized.  LEFT VENTRICLE PLAX 2D LVIDd:         2.10 cm  Diastology LVIDs:         1.50 cm  LV e' medial:  8.81 cm/s LV PW:         1.10 cm  LV e' lateral: 5.33 cm/s LV IVS:        1.10 cm LVOT diam:     2.40 cm LV SV:         59 LV SV Index:   36 LVOT Area:     4.52 cm  RIGHT VENTRICLE             IVC RV S prime:     11.70 cm/s  IVC diam: 1.60 cm TAPSE (M-mode):  1.7 cm LEFT ATRIUM             Index LA diam:        2.10 cm 1.28 cm/m LA Vol (A2C):   16.6 ml 10.11 ml/m LA Vol (A4C):   9.6 ml  5.86 ml/m LA Biplane Vol: 12.4 ml 7.55 ml/m  AORTIC VALVE                   PULMONIC VALVE AV Area (Vmax):    3.65 cm    PV Vmax:       0.68 m/s AV Area (Vmean):   2.93 cm    PV Peak grad:  1.8 mmHg AV Area (VTI):     4.63 cm AV Vmax:           115.00 cm/s AV Vmean:          79.800 cm/s AV VTI:            0.128 m AV Peak Grad:      5.3 mmHg AV Mean Grad:      3.0 mmHg LVOT Vmax:         92.70 cm/s LVOT Vmean:        51.700 cm/s LVOT VTI:          0.131 m LVOT/AV VTI ratio: 1.02  AORTA Ao Root diam: 3.10 cm TRICUSPID VALVE TR Peak grad:   29.4 mmHg TR Vmax:        271.00 cm/s  SHUNTS Systemic VTI:  0.13 m Systemic Diam: 2.40 cm Cherlynn Kaiser MD Electronically signed by Cherlynn Kaiser MD Signature Date/Time: 08/19/2021/12:00:36 AM    Final (Updated)    VAS [place] LOWER EXTREMITY VENOUS (DVT)  Result Date: 08/13/2021  Lower Venous DVT Study Patient Name:  TYLAR MERENDINO [facility]  Date of Exam:   08/13/2021 Medical Rec #: [id number]          Accession #:    [id number] Date of Birth: [birth year]           Patient Gender: F Patient Age:   66 years Exam Location:  [facility] Procedure:      VAS [place] LOWER EXTREMITY VENOUS (DVT) Referring Phys: Lelan Pons FRANCOIS --------------------------------------------------------------------------------  Indications: Stroke. Other Indications: PFO. Limitations: Poor ultrasound/tissue interface and patient positioning, patient immobility. Comparison Study: No prior studies. Performing Technologist: Oliver Hum RVT  Examination Guidelines: A complete evaluation includes B-mode imaging, spectral Doppler, color Doppler, and power Doppler as needed of all accessible portions of each vessel. Bilateral testing is considered an integral part of a complete examination. Limited examinations for reoccurring indications may be performed as noted. The reflux portion of the exam is performed with the patient in reverse Trendelenburg.  +---------+---------------+---------+-----------+----------+--------------+ RIGHT    CompressibilityPhasicitySpontaneityPropertiesThrombus Aging +---------+---------------+---------+-----------+----------+--------------+ CFV      Full           Yes      Yes                                 +---------+---------------+---------+-----------+----------+--------------+ SFJ      Full                                                        +---------+---------------+---------+-----------+----------+--------------+ FV Prox  Full                                                        +---------+---------------+---------+-----------+----------+--------------+  FV Mid   Full                                                        +---------+---------------+---------+-----------+----------+--------------+ FV DistalFull                                                        +---------+---------------+---------+-----------+----------+--------------+ PFV      Full                                                         +---------+---------------+---------+-----------+----------+--------------+ POP      Full           Yes      Yes                                 +---------+---------------+---------+-----------+----------+--------------+ PTV      Full                                                        +---------+---------------+---------+-----------+----------+--------------+ PERO     Full                                                        +---------+---------------+---------+-----------+----------+--------------+   +---------+---------------+---------+-----------+----------+--------------+ LEFT     CompressibilityPhasicitySpontaneityPropertiesThrombus Aging +---------+---------------+---------+-----------+----------+--------------+ CFV      Full           Yes      Yes                                 +---------+---------------+---------+-----------+----------+--------------+ SFJ      Full                                                        +---------+---------------+---------+-----------+----------+--------------+ FV Prox  Full                                                        +---------+---------------+---------+-----------+----------+--------------+ FV Mid   Full                                                        +---------+---------------+---------+-----------+----------+--------------+  FV Distal               Yes      Yes                                 +---------+---------------+---------+-----------+----------+--------------+ PFV      Full                                                        +---------+---------------+---------+-----------+----------+--------------+ POP      Full           Yes      Yes                                 +---------+---------------+---------+-----------+----------+--------------+ PTV      Full                                                         +---------+---------------+---------+-----------+----------+--------------+ PERO     Full                                                        +---------+---------------+---------+-----------+----------+--------------+     Summary: RIGHT: - There is no evidence of deep vein thrombosis in the lower extremity. However, portions of this examination were limited- see technologist comments above.  - No cystic structure found in the popliteal fossa.  LEFT: - There is no evidence of deep vein thrombosis in the lower extremity. However, portions of this examination were limited- see technologist comments above.  - No cystic structure found in the popliteal fossa.  *See table(s) above for measurements and observations. Electronically signed by Orlie Pollen on 08/13/2021 at 7:07:26 PM.    Final    VAS [place] UPPER EXTREMITY VENOUS DUPLEX  Result Date: 08/16/2021 UPPER VENOUS STUDY  Patient Name:  YENTY BLOCH [facility]  Date of Exam:   08/15/2021 Medical Rec #: [id number]          Accession #:    [id number] Date of Birth: [birth year]          Patient Gender: F Patient Age:   36 years Exam Location:  [facility] Procedure:      VAS [place] UPPER EXTREMITY VENOUS DUPLEX Referring Phys: Deno Etienne [facility] --------------------------------------------------------------------------------  Indications: Fever of unknown origin Limitations: Inability to keep arm extended. Comparison Study: No prior study Performing Technologist: Sharion Dove RVS  Examination Guidelines: A complete evaluation includes B-mode imaging, spectral Doppler, color Doppler, and power Doppler as needed of all accessible portions of each vessel. Bilateral testing is considered an integral part of a complete examination. Limited examinations for reoccurring indications may be performed as noted.  Right Findings: +----------+------------+---------+-----------+----------+---------------+ RIGHT     CompressiblePhasicitySpontaneousProperties    Summary      +----------+------------+---------+-----------+----------+---------------+ IJV           Full  Yes       Yes                              +----------+------------+---------+-----------+----------+---------------+ Subclavian               Yes       Yes                              +----------+------------+---------+-----------+----------+---------------+ Axillary      Full       Yes       Yes                              +----------+------------+---------+-----------+----------+---------------+ Brachial      Full       Yes       Yes                              +----------+------------+---------+-----------+----------+---------------+ Radial                                              patent by color +----------+------------+---------+-----------+----------+---------------+ Ulnar                                               Not visualized  +----------+------------+---------+-----------+----------+---------------+ Cephalic      Full                                                  +----------+------------+---------+-----------+----------+---------------+ Basilic                                             Not visualized  +----------+------------+---------+-----------+----------+---------------+  Left Findings: +----------+------------+---------+-----------+----------+-----------------+ LEFT      CompressiblePhasicitySpontaneousProperties     Summary      +----------+------------+---------+-----------+----------+-----------------+ IJV           Full       Yes       Yes                                +----------+------------+---------+-----------+----------+-----------------+ Subclavian               Yes       Yes                                +----------+------------+---------+-----------+----------+-----------------+ Axillary                 Yes       Yes                                 +----------+------------+---------+-----------+----------+-----------------+ Brachial      Full  Yes       Yes                                +----------+------------+---------+-----------+----------+-----------------+ Radial                                               patent by color  +----------+------------+---------+-----------+----------+-----------------+ Ulnar                                                patent by color  +----------+------------+---------+-----------+----------+-----------------+ Cephalic      None                                       Chronic      +----------+------------+---------+-----------+----------+-----------------+ Basilic       None                                  Age Indeterminate +----------+------------+---------+-----------+----------+-----------------+  Summary:  Right: No evidence of deep vein thrombosis in the upper extremity. No evidence of superficial vein thrombosis in the upper extremity.  Left: No evidence of deep vein thrombosis in the upper extremity. Findings consistent with age indeterminate superficial vein thrombosis involving the left basilic vein. 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  *See table(s) above for measurements and observations.  Diagnosing physician: Servando Snare MD Electronically signed by Servando Snare MD on 08/16/2021 at 4:45:02 PM.    Final    CT HEAD WO CONTRAST (CHARM STUDY)  Result Date: 08/14/2021 CLINICAL DATA:  Mild neurologic decline EXAM: CT HEAD WITHOUT CONTRAST TECHNIQUE: Contiguous axial images were obtained from the base of the skull through the vertex without intravenous contrast. COMPARISON:  CTA head 05/26/2021, MRI brain 08/01/2021. 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 Periventricular white matter changes, likely the sequela of chronic small vessel ischemic disease. No acute infarct,  hemorrhage, mass, mass effect, or midline shift. No definite hydrocephalus. Vascular: No hyperdense vessel or unexpected calcification. Skull: Normal. Negative for fracture or focal lesion. Sinuses/Orbits: No acute finding. Other: The mastoids are well aerated. IMPRESSION: No acute intracranial process. Electronically Signed   By: Merilyn Baba M.D.   On: 08/14/2021 01:04    Microbiology Recent Results (from the past 240 hour(s))  Urine Culture     Status: None   Collection Time: 08/14/21  7:59 AM   Specimen: In/Out Cath Urine  Result Value Ref Range Status   Specimen Description IN/OUT CATH URINE  Final   Special Requests NONE  Final   Culture   Final    NO GROWTH Performed at [facility], [address], [place], [place] [address]    Report Status 08/15/2021 FINAL  Final  Culture, blood (routine x 2)     Status: None   Collection Time: 08/14/21 12:16 PM   Specimen: BLOOD  Result Value Ref Range Status   Specimen Description BLOOD SITE NOT SPECIFIED  Final   Special Requests   Final  BOTTLES DRAWN AEROBIC ONLY Blood Culture adequate volume   Culture   Final    NO GROWTH 5 DAYS Performed at [facility], [place] [address], [place], [place] [address]    Report Status 08/19/2021 FINAL  Final  Culture, blood (routine x 2)     Status: None   Collection Time: 08/14/21 12:16 PM   Specimen: BLOOD  Result Value Ref Range Status   Specimen Description BLOOD SITE NOT SPECIFIED  Final   Special Requests   Final    BOTTLES DRAWN AEROBIC ONLY Blood Culture results may not be optimal due to an inadequate volume of blood received in culture bottles   Culture   Final    NO GROWTH 5 DAYS Performed at [facility], [place] [address], [place], [place] [address]    Report Status 08/19/2021 FINAL  Final  Resp Panel by RT-PCR (Flu A&B, Covid) Nasopharyngeal Swab     Status: None   Collection Time: 08/15/21  9:55 AM   Specimen: Nasopharyngeal Swab; Nasopharyngeal(NP) swabs in vial  transport medium  Result Value Ref Range Status   SARS Coronavirus 2 by RT PCR NEGATIVE NEGATIVE Final    Comment: (NOTE) SARS-CoV-2 target nucleic acids are NOT DETECTED.  The SARS-CoV-2 RNA is generally detectable in upper respiratory specimens during the acute phase of infection. The lowest concentration of SARS-CoV-2 viral copies this assay can detect is 138 copies/mL. A negative result does not preclude SARS-Cov-2 infection and should not be used as the sole basis for treatment or other patient management decisions. A negative result may occur with  improper specimen collection/handling, submission of specimen other than nasopharyngeal swab, presence of viral mutation(s) within the areas targeted by this assay, and inadequate number of viral copies(<138 copies/mL). A negative result must be combined with clinical observations, patient history, and epidemiological information. The expected result is Negative.  Fact Sheet for Patients:  EntrepreneurPulse.com.au  Fact Sheet for Healthcare Providers:  IncredibleEmployment.be  This test is no t yet approved or cleared by the [place] FDA and  has been authorized for detection and/or diagnosis of SARS-CoV-2 by FDA under an Emergency Use Authorization (EUA). This EUA will remain  in effect (meaning this test can be used) for the duration of the COVID-19 declaration under Section 564(b)(1) of the Act, 21 U.S.C.section 360bbb-3(b)(1), unless the authorization is terminated  or revoked sooner.       Influenza A by PCR NEGATIVE NEGATIVE Final   Influenza B by PCR NEGATIVE NEGATIVE Final    Comment: (NOTE) The Xpert Xpress SARS-CoV-2/FLU/RSV plus assay is intended as an aid in the diagnosis of influenza from Nasopharyngeal swab specimens and should not be used as a sole basis for treatment. Nasal washings and aspirates are unacceptable for Xpert Xpress SARS-CoV-2/FLU/RSV testing.  Fact  Sheet for Patients: EntrepreneurPulse.com.au  Fact Sheet for Healthcare Providers: IncredibleEmployment.be  This test is not yet approved or cleared by the [place] FDA and has been authorized for detection and/or diagnosis of SARS-CoV-2 by FDA under an Emergency Use Authorization (EUA). This EUA will remain in effect (meaning this test can be used) for the duration of the COVID-19 declaration under Section 564(b)(1) of the Act, 21 U.S.C. section 360bbb-3(b)(1), unless the authorization is terminated or revoked.  Performed at [facility], [place] [address] [place], [place] [address]   Urine Culture     Status: None   Collection Time: 08/17/21  2:00 PM   Specimen: In/Out Cath Urine  Result Value Ref Range Status   Specimen Description IN/OUT CATH URINE  Final   Special Requests NONE  Final   Culture   Final    NO GROWTH Performed at [facility], [address] [place], [place] [address]    Report Status 08/18/2021 FINAL  Final  Urine Culture     Status: Abnormal   Collection Time: 08/19/21 11:00 AM   Specimen: Urine, Clean Catch  Result Value Ref Range Status   Specimen Description URINE, CLEAN CATCH  Final   Special Requests NONE  Final   Culture (A)  Final    <10,000 COLONIES/mL INSIGNIFICANT GROWTH Performed at [facility], [place] [address], [place], [place] [address]    Report Status 08/20/2021 FINAL  Final  Culture, blood (routine x 2)     Status: None (Preliminary result)   Collection Time: 08/19/21 10:04 PM   Specimen: BLOOD  Result Value Ref Range Status   Specimen Description BLOOD LEFT ANTECUBITAL  Final   Special Requests   Final    BOTTLES DRAWN AEROBIC AND ANAEROBIC Blood Culture adequate volume   Culture   Final    NO GROWTH 3 DAYS Performed at [facility], [place] [address], [place], [place] [address]    Report Status PENDING  Incomplete  Culture, blood (routine x 2)     Status: None  (Preliminary result)   Collection Time: 08/19/21 10:04 PM   Specimen: BLOOD RIGHT HAND  Result Value Ref Range Status   Specimen Description BLOOD RIGHT HAND  Final   Special Requests BOTTLES DRAWN AEROBIC ONLY  Final   Culture   Final    NO GROWTH 3 DAYS Performed at [facility], [place] [address], [place], [place] [address]    Report Status PENDING  Incomplete  MRSA Next Gen by PCR, Nasal     Status: None   Collection Time: 08/20/21  9:38 PM   Specimen: Nasal Mucosa; Nasal Swab  Result Value Ref Range Status   MRSA by PCR Next Gen NOT DETECTED NOT DETECTED Final    Comment: (NOTE) The GeneXpert MRSA Assay (FDA approved for NASAL specimens only), is one component of a comprehensive MRSA colonization surveillance program. It is not intended to diagnose MRSA infection nor to guide or monitor treatment for MRSA infections. Test performance is not FDA approved in patients less than 75 years old. Performed at [facility], [place] [address], [place], [place] [address]   Urine Culture     Status: None   Collection Time: 2021/09/10 10:16 AM   Specimen: Urine, Catheterized  Result Value Ref Range Status   Specimen Description URINE, CATHETERIZED  Final   Special Requests NONE  Final   Culture   Final    NO GROWTH Performed at [place] [address], [place], [place] [address]    Report Status 08/22/2021 FINAL  Final    Lab Basic Metabolic Panel: Recent Labs  Lab 08/16/21 0249 08/17/21 0505 08/18/21 0059 08/19/21 1019 08/20/21 0417 08/20/21 0917 08/20/21 1645 08/20/21 1741 2021/09/10 0625 September 10, 2021 0755 September 10, 2021 1222  NA 140 138 139 139 140 142  --  144  --  142 144  K 3.8 3.5 4.1 5.1 4.7 5.0  --  5.0  --  3.5 3.6  CL 112* 108 111 112* 115*  --   --   --   --  112*  --   CO2 19* 20* 18* 15* 14*  --   --   --   --  21*  --   GLUCOSE 109* 135* 133* 126* 131*  --   --   --   --  209*  --   BUN 20 23 31* 46* 55*  --   --   --   --  68*  --   CREATININE  0.69 0.85 1.07* 1.70* 2.16*  --   --   --   --  2.24*  --   CALCIUM 8.2* 8.2* 8.2* 8.1* 7.3*  --   --   --   --  7.0*  --   MG 1.7 1.8 2.1  --   --   --  2.1  --  2.0  --   --   PHOS  --   --   --   --   --   --  4.5  --  3.5  --   --    Liver Function Tests: Recent Labs  Lab 08/16/21 0249 08/17/21 0505 08/18/21 0059 31-Aug-2021 0755  AST 26 31 36 712*  ALT 11 12 10  202*  ALKPHOS 41 38 39 113  BILITOT 0.5 0.4 0.3 0.6  PROT 5.2* 5.0* 4.9* 3.6*  ALBUMIN 1.9* 1.8* 1.9* 1.8*   No results for input(s): LIPASE, AMYLASE in the last 168 hours. No results for input(s): AMMONIA in the last 168 hours. CBC: Recent Labs  Lab 08/16/21 0249 08/17/21 0505 08/18/21 0059 08/19/21 1019 08/20/21 0417 08/20/21 0917 08/20/21 1741 2021-08-31 0755 Aug 31, 2021 1222  WBC 10.3 13.4* 10.7* 20.2* 32.9*  --   --  39.6*  --   NEUTROABS 8.8* 11.8* 9.0*  --  28.9*  --   --   --   --   HGB 12.2 12.4 13.3 14.6 13.1 11.9* 13.6 12.0 13.9  HCT 38.0 39.1 40.6 45.0 42.2 35.0* 40.0 35.7* 41.0  MCV 83.7 85.0 84.1 84.3 87.4  --   --  83.0  --   PLT 413* 435* 414* 469* 468*  --   --  350  --    Cardiac Enzymes: Recent Labs  Lab 08/20/21 0417  CKTOTAL 58   Sepsis Labs: Recent Labs  Lab 08/16/21 1015 08/17/21 0505 08/18/21 0059 08/19/21 0049 08/19/21 1019 08/19/21 2204 08/20/21 0417 Aug 31, 2021 0755 08-31-21 0814  PROCALCITON  --    < > 1.14 1.80  --   --  3.41  --  4.96  WBC  --    < > 10.7*  --  20.2*  --  32.9* 39.6*  --   LATICACIDVEN 2.0*  --   --   --   --  4.9* 5.0* 2.9*  --    < > = values in this interval not displayed.     Freddi Starr 08/22/2021, 1:44 PM

## 2021-08-28 NOTE — Progress Notes (Addendum)
NAME:  Michaela Rios, MRN:  [id number], DOB:  [birth year], LOS: 63 ADMISSION DATE:  08/27/2021, CONSULTATION DATE: 08/20/2021 REFERRING MD: [facility], CHIEF COMPLAINT: Sepsis  History of Present Illness:  70 year old female who has a plethora of health issues which are notable for bilateral strokes for which she has been followed by neurology who have signed off at this time.  She admitted for 9 days and now is spiking fevers, elevated procalcitonin, elevated lactic acid and generalized failure to thrive.  Ongoing discussions with husband results in the full code at this time.  No obvious source of infection is noted she does have a history of UTI with Klebsiella. We will transferred her to the intensive care unit, broaden her antibiotics, bicarbonate drip with further investigation as to source of infection.  Metabolic acidosis being treated with bicarbonate drip.  See below for her extensive past medical history.  Pertinent  Medical History   Past Medical History:  Diagnosis Date   Anemia    Anxiety    Arthritis    Cataract    Chronic migraine    follows with neuro for same   Clotting disorder (HCC)    clot in ovarian vein- hx   Esophagitis    GERD (gastroesophageal reflux disease)    Hypertension    IBS (irritable bowel syndrome)    Low back pain syndrome    MRSA (methicillin resistant Staphylococcus aureus) infection 04/17/12   left torso   Peptic ulcer disease 2003, 12/2012   Peripheral vascular disease ([place])    Vitamin D deficiency      Significant Hospital Events: Including procedures, antibiotic start and stop dates in addition to other pertinent events   08/20/2021 transferred intensive care unit, started on bicarb gtt  Interim History / Subjective:   No acute events overnight. Bicarb drip stopped this morning.   Patient somnolent this morning.  Objective   Blood pressure 116/89, pulse (!) 139, temperature 98.3 F (36.8 C), temperature source Oral, resp. rate (!) 26,  height 5\' 6"  (1.676 m), weight 69 kg, SpO2 100 %.        Intake/Output Summary (Last 24 hours) at 2021/09/01 0811 Last data filed at 2021-09-01 0804 Gross per 24 hour  Intake 3175.41 ml  Output 100 ml  Net 3075.41 ml   Filed Weights   08/14/21 0400 08/15/21 0455 2021-09-01 0500  Weight: 55.1 kg 57 kg 69 kg    Examination: General: Frail elderly female poorly responsive HENT: Pupils are equal reactive to light.  Oropharynx extremely dry.  No JVD or lymphadenopathy is appreciated.  Questionable right-sided facial droop Lungs: Distant but clear Cardiovascular: Heart sounds are regular with sinus tach 136 Abdomen: Soft nontender formed stool was noted at the rectum Extremities: Cool extremities Neuro: Will open eyes to voice will answer simple questions will squeeze bilaterally but extremely weak.  Questionable left-sided weakness. GU: Voids  Lactic acid trended down to 2.9 from 5.   Cr continue to climb to 2.24 from 0.8 on 9/20  AST/ALT 712/202  Resolved Hospital Problem list     Assessment & Plan:  SIRS with unknown source  Patient with altered mentation, tachycardia, increasing WBC count, AKI and elevated liver enzymes. She has been ANA positive in the past with abnormal lupus anti-coagulant testing. I am concerned her SIRS/Sepsis syndrome may be driven by an inflammatory condition rather than infection.  - Antibiotics broadened to vancomycin and Zosyn 08/20/2021 - Follow up cultures - CT imaging of Chest/Abd/Pelv with no indications of  a potential source - Question if there is an inflammatory etiology to her presentation as ANA was positive previously with elevated SM ab  Acute on chronic renal Failure Non-Anion Gap Metabolic Acidosis -Possible Contrast induced nephropathy vs sepsis related vs lupus nephritis  - Oliguric - Cr continues to rise - Stop bicarb drip due to rising pH - Will consider nephrology consult - Check urinalysis, complement levels.   Altered mental  status  Hx of bilateral strokes  Metabolic Abnormalities - Re-consulted neurology for input on the hypercoagulable panel, may require anticoagulation - Close neurologic monitoring - EEG negative from 9/22, less likely seizures contributing so keppra stopped which was started overnight - Altered mentation possible related to lupus involvement  Elevated Liver Enzymes In setting of SIRS/Sepsis - hepatitis panel negative on 7/2 - Check RUQ with doppler flow, possible portal vein thrombosis   Protein Calorie Malnutrition - Continue tube feeds  Generalized failure to thrive in person he has been hospitalized since June 2022 with multiple strokes multiple admissions and has continued decline despite all interventions. Ongoing discussions with husband Limited code status, patient is intubate only but no CPR   Best Practice (right click and "Reselect all SmartList Selections" daily)   Diet/type: tubefeeds DVT prophylaxis: prophylactic heparin  GI prophylaxis: PPI Lines: N/A Foley:  N/A Code Status:  limited, no CPR but short-term intubation if needed Last date of multidisciplinary goals of care discussion: 9/23  Labs   CBC: Recent Labs  Lab 08/15/21 0055 08/16/21 0249 08/17/21 0505 08/18/21 0059 08/19/21 1019 08/20/21 0417 08/20/21 0917 08/20/21 1741  WBC 11.2* 10.3 13.4* 10.7* 20.2* 32.9*  --   --   NEUTROABS 9.7* 8.8* 11.8* 9.0*  --  28.9*  --   --   HGB 11.6* 12.2 12.4 13.3 14.6 13.1 11.9* 13.6  HCT 35.3* 38.0 39.1 40.6 45.0 42.2 35.0* 40.0  MCV 84.0 83.7 85.0 84.1 84.3 87.4  --   --   PLT 391 413* 435* 414* 469* 468*  --   --     Basic Metabolic Panel: Recent Labs  Lab 08/16/21 0249 08/17/21 0505 08/18/21 0059 08/19/21 1019 08/20/21 0417 08/20/21 0917 08/20/21 1645 08/20/21 1741 2021-09-09 0625  NA 140 138 139 139 140 142  --  144  --   K 3.8 3.5 4.1 5.1 4.7 5.0  --  5.0  --   CL 112* 108 111 112* 115*  --   --   --   --   CO2 19* 20* 18* 15* 14*  --   --    --   --   GLUCOSE 109* 135* 133* 126* 131*  --   --   --   --   BUN 20 23 31* 46* 55*  --   --   --   --   CREATININE 0.69 0.85 1.07* 1.70* 2.16*  --   --   --   --   CALCIUM 8.2* 8.2* 8.2* 8.1* 7.3*  --   --   --   --   MG 1.7 1.8 2.1  --   --   --  2.1  --  2.0  PHOS  --   --   --   --   --   --  4.5  --  3.5   GFR: Estimated Creatinine Clearance: 22.7 mL/min (A) (by C-G formula based on SCr of 2.16 mg/dL (H)). Recent Labs  Lab 08/16/21 0713 08/16/21 1015 08/17/21 0505 08/18/21 0059 08/19/21 0049 08/19/21 1019  08/19/21 2204 08/20/21 0417  PROCALCITON  --   --  0.55 1.14 1.80  --   --  3.41  WBC  --   --  13.4* 10.7*  --  20.2*  --  32.9*  LATICACIDVEN 1.6 2.0*  --   --   --   --  4.9* 5.0*    Liver Function Tests: Recent Labs  Lab 08/15/21 0055 08/16/21 0249 08/17/21 0505 08/18/21 0059  AST 24 26 31  36  ALT 11 11 12 10   ALKPHOS 41 41 38 39  BILITOT 0.6 0.5 0.4 0.3  PROT 5.5* 5.2* 5.0* 4.9*  ALBUMIN 2.0* 1.9* 1.8* 1.9*   No results for input(s): LIPASE, AMYLASE in the last 168 hours. No results for input(s): AMMONIA in the last 168 hours.  ABG    Component Value Date/Time   PHART 7.452 (H) 08/20/2021 1741   PCO2ART 22.1 (L) 08/20/2021 1741   PO2ART 92 08/20/2021 1741   HCO3 15.5 (L) 08/20/2021 1741   TCO2 16 (L) 08/20/2021 1741   ACIDBASEDEF 7.0 (H) 08/20/2021 1741   O2SAT 98.0 08/20/2021 1741     Coagulation Profile: No results for input(s): INR, PROTIME in the last 168 hours.   Cardiac Enzymes: Recent Labs  Lab 08/20/21 0417  CKTOTAL 58    HbA1C: Hgb A1c MFr Bld  Date/Time Value Ref Range Status  05/27/2021 01:06 AM 5.9 (H) 4.8 - 5.6 % Final    Comment:    (NOTE)         Prediabetes: 5.7 - 6.4         Diabetes: >6.4         Glycemic control for adults with diabetes: <7.0   04/07/2021 02:50 PM 5.8 4.6 - 6.5 % Final    Comment:    Glycemic Control Guidelines for People with Diabetes:Non Diabetic:  <6%Goal of Therapy: <7%Additional Action  Suggested:  >8%     CBG: Recent Labs  Lab 08/20/21 1103 08/20/21 1511 08/20/21 1908 08/20/21 2347 09-19-21 0325  GLUCAP 103* 191* 133* 147* 185*       Critical care time: 50 minutes    Freda Jackson, MD [facility] Office: [phone]   See Amion for personal pager PCCM on call pager [phone] until 7pm. Please call Elink 7p-7a. [phone]

## 2021-08-28 NOTE — Progress Notes (Signed)
[facility] Progress Note Patient Name: Michaela Rios DOB: 06/12/1951 MRN: [id number]   Date of Service  08/28/21  HPI/Events of Note  Elevated HR - HR now = 150's.   eICU Interventions  Plan: 12 Lead EKG STAT. Further Rx based on EKG findings.      Intervention Category Major Interventions: Arrhythmia - evaluation and management  Roshard Rezabek [place] 2021-08-28, 3:42 AM

## 2021-08-28 DEATH — deceased

## 2021-08-30 LAB — JAK2 EXONS 12-15

## 2021-08-30 LAB — JAK2  V617F QUAL. WITH REFLEX TO EXON 12: Reflex:: 15

## 2021-08-31 ENCOUNTER — Ambulatory Visit: Payer: Medicare HMO | Admitting: Physical Therapy

## 2021-08-31 ENCOUNTER — Encounter: Payer: Medicare HMO | Admitting: Occupational Therapy

## 2021-09-03 ENCOUNTER — Ambulatory Visit: Payer: Medicare HMO

## 2021-09-07 ENCOUNTER — Ambulatory Visit: Payer: Medicare HMO | Admitting: Physical Therapy

## 2021-09-07 ENCOUNTER — Encounter: Payer: Medicare HMO | Admitting: Occupational Therapy

## 2021-09-10 ENCOUNTER — Encounter: Payer: Medicare HMO | Admitting: Occupational Therapy

## 2021-09-10 ENCOUNTER — Ambulatory Visit: Payer: Medicare HMO

## 2021-09-14 ENCOUNTER — Ambulatory Visit: Payer: Medicare HMO | Admitting: Physical Therapy

## 2021-09-14 ENCOUNTER — Encounter: Payer: Medicare HMO | Admitting: Occupational Therapy

## 2021-09-16 ENCOUNTER — Encounter: Payer: Medicare HMO | Admitting: Occupational Therapy

## 2021-09-16 ENCOUNTER — Ambulatory Visit: Payer: Medicare HMO | Admitting: Physical Therapy

## 2021-09-21 ENCOUNTER — Encounter: Payer: Medicare HMO | Admitting: Occupational Therapy

## 2021-09-21 ENCOUNTER — Ambulatory Visit: Payer: Medicare HMO | Admitting: Physical Therapy

## 2021-09-23 ENCOUNTER — Ambulatory Visit: Payer: Medicare HMO | Admitting: Physical Therapy

## 2021-09-23 ENCOUNTER — Encounter: Payer: Medicare HMO | Admitting: Occupational Therapy

## 2021-09-28 ENCOUNTER — Ambulatory Visit: Payer: Medicare HMO | Admitting: Physical Therapy

## 2021-09-28 ENCOUNTER — Encounter: Payer: Medicare HMO | Admitting: Occupational Therapy

## 2021-09-30 ENCOUNTER — Inpatient Hospital Stay: Payer: Medicare HMO | Admitting: Neurology

## 2021-10-01 ENCOUNTER — Encounter: Payer: Medicare HMO | Admitting: Occupational Therapy

## 2021-10-01 ENCOUNTER — Ambulatory Visit: Payer: Medicare HMO

## 2021-10-07 LAB — FLOW CYTOMETRY

## 2021-10-11 ENCOUNTER — Ambulatory Visit: Payer: Medicare HMO | Admitting: Internal Medicine
